# Patient Record
Sex: Female | Born: 1985 | Race: Black or African American | Hispanic: No | Marital: Single | Smoking: Former smoker
Health system: Southern US, Community
[De-identification: ages and names within clinical notes are randomized; demographics above are authoritative.]

## PROBLEM LIST (undated history)

## (undated) DIAGNOSIS — N182 Chronic kidney disease, stage 2 (mild): Secondary | ICD-10-CM

## (undated) DIAGNOSIS — E119 Type 2 diabetes mellitus without complications: Secondary | ICD-10-CM

## (undated) DIAGNOSIS — I639 Cerebral infarction, unspecified: Secondary | ICD-10-CM

## (undated) DIAGNOSIS — I10 Essential (primary) hypertension: Secondary | ICD-10-CM

## (undated) DIAGNOSIS — E669 Obesity, unspecified: Secondary | ICD-10-CM

## (undated) DIAGNOSIS — I509 Heart failure, unspecified: Secondary | ICD-10-CM

## (undated) DIAGNOSIS — K219 Gastro-esophageal reflux disease without esophagitis: Secondary | ICD-10-CM

## (undated) DIAGNOSIS — I5022 Chronic systolic (congestive) heart failure: Secondary | ICD-10-CM

## (undated) DIAGNOSIS — F1910 Other psychoactive substance abuse, uncomplicated: Secondary | ICD-10-CM

## (undated) DIAGNOSIS — Z91148 Patient's other noncompliance with medication regimen for other reason: Secondary | ICD-10-CM

## (undated) DIAGNOSIS — D509 Iron deficiency anemia, unspecified: Secondary | ICD-10-CM

## (undated) DIAGNOSIS — I428 Other cardiomyopathies: Secondary | ICD-10-CM

## (undated) DIAGNOSIS — Z9114 Patient's other noncompliance with medication regimen: Secondary | ICD-10-CM

## (undated) HISTORY — DX: Essential (primary) hypertension: I10

## (undated) HISTORY — PX: HERNIA REPAIR: SHX51

## (undated) HISTORY — PX: CHOLECYSTECTOMY: SHX55

## (undated) HISTORY — PX: ICD IMPLANT: EP1208

---

## 2007-01-02 ENCOUNTER — Emergency Department: Payer: Self-pay | Admitting: Emergency Medicine

## 2009-12-10 ENCOUNTER — Emergency Department: Payer: Self-pay | Admitting: Emergency Medicine

## 2010-07-24 ENCOUNTER — Emergency Department: Payer: Self-pay | Admitting: Unknown Physician Specialty

## 2010-12-18 ENCOUNTER — Emergency Department: Payer: Self-pay | Admitting: Emergency Medicine

## 2011-05-19 ENCOUNTER — Emergency Department: Payer: Self-pay | Admitting: Emergency Medicine

## 2011-08-23 ENCOUNTER — Emergency Department: Payer: Self-pay | Admitting: Emergency Medicine

## 2011-08-24 ENCOUNTER — Emergency Department: Payer: Self-pay | Admitting: Emergency Medicine

## 2013-12-15 ENCOUNTER — Emergency Department: Payer: Self-pay | Admitting: Emergency Medicine

## 2019-04-10 ENCOUNTER — Emergency Department
Admission: EM | Admit: 2019-04-10 | Discharge: 2019-04-10 | Disposition: A | Discharge status: Home / Self Care | Payer: Self-pay | Attending: Emergency Medicine | Admitting: Emergency Medicine

## 2019-04-10 ENCOUNTER — Emergency Department: Payer: Self-pay

## 2019-04-10 ENCOUNTER — Encounter: Payer: Self-pay | Admitting: Emergency Medicine

## 2019-04-10 ENCOUNTER — Other Ambulatory Visit: Payer: Self-pay

## 2019-04-10 DIAGNOSIS — F17210 Nicotine dependence, cigarettes, uncomplicated: Secondary | ICD-10-CM | POA: Insufficient documentation

## 2019-04-10 DIAGNOSIS — R0789 Other chest pain: Secondary | ICD-10-CM | POA: Insufficient documentation

## 2019-04-10 DIAGNOSIS — R739 Hyperglycemia, unspecified: Secondary | ICD-10-CM | POA: Insufficient documentation

## 2019-04-10 DIAGNOSIS — R778 Other specified abnormalities of plasma proteins: Secondary | ICD-10-CM | POA: Insufficient documentation

## 2019-04-10 DIAGNOSIS — E1369 Other specified diabetes mellitus with other specified complication: Secondary | ICD-10-CM

## 2019-04-10 HISTORY — DX: Gastro-esophageal reflux disease without esophagitis: K21.9

## 2019-04-10 LAB — HEPATIC FUNCTION PANEL
ALT: 14 U/L (ref 0–44)
AST: 14 U/L — ABNORMAL LOW (ref 15–41)
Albumin: 3 g/dL — ABNORMAL LOW (ref 3.5–5.0)
Alkaline Phosphatase: 94 U/L (ref 38–126)
Bilirubin, Direct: <0.1 mg/dL (ref 0.0–0.2)
Total Bilirubin: 0.9 mg/dL (ref 0.3–1.2)
Total Protein: 7.7 g/dL (ref 6.5–8.1)

## 2019-04-10 LAB — BASIC METABOLIC PANEL
Anion gap: 14 (ref 5–15)
BUN: 13 mg/dL (ref 6–20)
CO2: 21 mmol/L — ABNORMAL LOW (ref 22–32)
Calcium: 8.4 mg/dL — ABNORMAL LOW (ref 8.9–10.3)
Chloride: 96 mmol/L — ABNORMAL LOW (ref 98–111)
Creatinine, Ser: 1.06 mg/dL — ABNORMAL HIGH (ref 0.44–1.00)
GFR calc Af Amer: >60 mL/min (ref >60)
GFR calc non Af Amer: >60 mL/min (ref >60)
Glucose, Bld: 292 mg/dL — ABNORMAL HIGH (ref 70–99)
Potassium: 3.6 mmol/L (ref 3.5–5.1)
Sodium: 131 mmol/L — ABNORMAL LOW (ref 135–145)

## 2019-04-10 LAB — LIPASE, BLOOD: Lipase: 20 U/L (ref 11–51)

## 2019-04-10 LAB — CBC
HCT: 42.7 % (ref 36.0–46.0)
Hemoglobin: 13.6 g/dL (ref 12.0–15.0)
MCH: 27.5 pg (ref 26.0–34.0)
MCHC: 31.9 g/dL (ref 30.0–36.0)
MCV: 86.4 fL (ref 80.0–100.0)
Platelets: 449 10*3/uL — ABNORMAL HIGH (ref 150–400)
RBC: 4.94 MIL/uL (ref 3.87–5.11)
RDW: 14.1 % (ref 11.5–15.5)
WBC: 11.4 10*3/uL — ABNORMAL HIGH (ref 4.0–10.5)
nRBC: 0 % (ref 0.0–0.2)

## 2019-04-10 LAB — POCT PREGNANCY, URINE: Preg Test, Ur: NEGATIVE

## 2019-04-10 LAB — HEMOGLOBIN A1C
Hgb A1c MFr Bld: 12.7 % — ABNORMAL HIGH (ref 4.8–5.6)
Mean Plasma Glucose: 317.79 mg/dL

## 2019-04-10 LAB — GLUCOSE, CAPILLARY: Glucose-Capillary: 284 mg/dL — ABNORMAL HIGH (ref 70–99)

## 2019-04-10 LAB — FIBRIN DERIVATIVES D-DIMER (ARMC ONLY): Fibrin derivatives D-dimer (ARMC): 553.75 ng/mL (FEU) — ABNORMAL HIGH (ref 0.00–499.00)

## 2019-04-10 LAB — TROPONIN I (HIGH SENSITIVITY)
Troponin I (High Sensitivity): 20 ng/L — ABNORMAL HIGH (ref <18)
Troponin I (High Sensitivity): 20 ng/L — ABNORMAL HIGH (ref <18)

## 2019-04-10 MED ORDER — SODIUM CHLORIDE 0.9 % IV BOLUS
1000.0000 mL | Freq: Once | INTRAVENOUS | Status: AC
  Administered 2019-04-10: 16:00:00 1000 mL via INTRAVENOUS

## 2019-04-10 MED ORDER — ALUM & MAG HYDROXIDE-SIMETH 200-200-20 MG/5ML PO SUSP
30.0000 mL | Freq: Once | ORAL | Status: AC
  Administered 2019-04-10: 16:00:00 30 mL via ORAL
  Filled 2019-04-10: qty 30

## 2019-04-10 MED ORDER — FENTANYL CITRATE (PF) 100 MCG/2ML IJ SOLN
50.0000 ug | Freq: Once | INTRAMUSCULAR | Status: AC
  Administered 2019-04-10: 18:00:00 50 ug via INTRAVENOUS
  Filled 2019-04-10: qty 2

## 2019-04-10 MED ORDER — ONDANSETRON HCL 4 MG/2ML IJ SOLN
4.0000 mg | Freq: Once | INTRAMUSCULAR | Status: AC
  Administered 2019-04-10: 4 mg via INTRAVENOUS
  Filled 2019-04-10: qty 2

## 2019-04-10 MED ORDER — ONDANSETRON HCL 4 MG/2ML IJ SOLN: 4.0000 mg | Freq: Once | INTRAMUSCULAR | Status: DC

## 2019-04-10 MED ORDER — ACETAMINOPHEN 325 MG PO TABS
650.0000 mg | ORAL_TABLET | Freq: Once | ORAL | Status: AC
  Administered 2019-04-10: 16:00:00 650 mg via ORAL
  Filled 2019-04-10: qty 2

## 2019-04-10 MED ORDER — METFORMIN HCL 500 MG PO TABS: 500.0000 mg | ORAL_TABLET | Freq: Two times a day (BID) | ORAL | 0 refills | Status: DC

## 2019-04-10 MED ORDER — SODIUM CHLORIDE 0.9 % IV BOLUS
1000.0000 mL | Freq: Once | INTRAVENOUS | Status: AC
  Administered 2019-04-10: 18:00:00 1000 mL via INTRAVENOUS

## 2019-04-10 MED ORDER — SODIUM CHLORIDE 0.9% FLUSH
3.0000 mL | Freq: Once | INTRAVENOUS | Status: AC
  Administered 2019-04-10: 3 mL via INTRAVENOUS

## 2019-04-10 MED ORDER — IOHEXOL 350 MG/ML SOLN
75.0000 mL | Freq: Once | INTRAVENOUS | Status: AC | PRN
  Administered 2019-04-10: 75 mL via INTRAVENOUS
  Filled 2019-04-10: qty 75

## 2019-04-10 MED ORDER — PANTOPRAZOLE SODIUM 40 MG IV SOLR
40.0000 mg | Freq: Once | INTRAVENOUS | Status: AC
  Administered 2019-04-10: 16:00:00 40 mg via INTRAVENOUS
  Filled 2019-04-10: qty 40

## 2019-04-10 NOTE — ED Notes (Signed)
Patient transported to CT 

## 2019-04-10 NOTE — ED Notes (Signed)
Lights dimmed for patient comfort. Pt resting in bed at this time. Pt states she feels better, states she feels sleepy.

## 2019-04-10 NOTE — ED Notes (Signed)
Patient in bathroom at this time.

## 2019-04-10 NOTE — ED Provider Notes (Signed)
[facility] Emergency Department Provider Note  ____________________________________________   First MD Initiated Contact with Patient 04/10/19 1511     (approximate)  I have reviewed the triage vital signs and the nursing notes.   HISTORY  Chief Complaint Heartburn    HPI Molly Howard is a 33 y.o. female with acid reflux who comes in with pain in the center of her chest that radiates up into her chest.  She states that it feels like her prior acid reflux.  She not been taking her medicine for it.  Her pain is constant over the past 2 days, nothing makes it better, nothing makes it worse.  She has had a little bit of shortness of breath which is new for her.  She states it does hurt when she takes a deep breath.  She states that prior to today she had not been eating well but today she is eating better.  She denies any fevers.  Denies any leg swelling.  She has had reduced motion of her left shoulder secondary to a fall 1 year ago.  She states that she cannot raise it over over her shoulder.          Past Medical History:  Diagnosis Date   Acid reflux     There are no active problems to display for this patient.   Past Surgical History:  Procedure Laterality Date   CHOLECYSTECTOMY     HERNIA REPAIR      Prior to Admission medications   Not on File    Allergies Patient has no known allergies.  No family history on file.  Social History Social History   Tobacco Use   Smoking status: Current Every Day Smoker    Types: Cigarettes   Smokeless tobacco: Never Used  Substance Use Topics   Alcohol use: Yes   Drug use: Yes    Types: Cocaine      Review of Systems Constitutional: No fever/chills Eyes: No visual changes. ENT: No sore throat. Cardiovascular: Positive chest pain Respiratory: Positive shortness of breath Gastrointestinal: No abdominal pain.  No nausea, no vomiting.  No diarrhea.  No constipation. Genitourinary:  Negative for dysuria. Musculoskeletal: Negative for back pain.  Positive left shoulder pain Skin: Negative for rash. Neurological: Negative for headaches, focal weakness or numbness. All other ROS negative ____________________________________________   PHYSICAL EXAM:  VITAL SIGNS: ED Triage Vitals  Enc Vitals Group     BP 04/10/19 1433 (!) 143/107     Pulse Rate 04/10/19 1433 (!) 124     Resp 04/10/19 1433 20     Temp 04/10/19 1433 98.7 F (37.1 C)     Temp Source 04/10/19 1433 Oral     SpO2 04/10/19 1433 100 %     Weight 04/10/19 1435 220 lb (99.8 kg)     Height 04/10/19 1435 5\' 7"  (1.702 m)     Head Circumference --      Peak Flow --      Pain Score 04/10/19 1450 7     Pain Loc --      Pain Edu? --      Excl. in [place]? --     Constitutional: Alert and oriented. Well appearing and in no acute distress. Eyes: Conjunctivae are normal. EOMI. Head: Atraumatic. Nose: No congestion/rhinnorhea. Mouth/Throat: Mucous membranes are moist.   Neck: No stridor. Trachea Midline. FROM Cardiovascular: Normal rate, regular rhythm. Grossly normal heart sounds.  Good peripheral circulation. Respiratory: Normal respiratory effort.  No retractions. Lungs CTAB. Gastrointestinal: Soft and nontender. No distention. No abdominal bruits.  Musculoskeletal: Tenderness on the left shoulder with limited range of motion Neurologic:  Normal speech and language. No gross focal neurologic deficits are appreciated.  Skin:  Skin is warm, dry and intact. No rash noted. Psychiatric: Mood and affect are normal. Speech and behavior are normal. GU: Deferred   ____________________________________________   LABS (all labs ordered are listed, but only abnormal results are displayed)  Labs Reviewed  BASIC METABOLIC PANEL - Abnormal; Notable for the following components:      Result Value   Sodium 131 (*)    Chloride 96 (*)    CO2 21 (*)    Glucose, Bld 292 (*)    Creatinine, Ser 1.06 (*)    Calcium 8.4  (*)    All other components within normal limits  CBC - Abnormal; Notable for the following components:   WBC 11.4 (*)    Platelets 449 (*)    All other components within normal limits  FIBRIN DERIVATIVES D-DIMER ([facility] ONLY) - Abnormal; Notable for the following components:   Fibrin derivatives D-dimer ([facility]) 553.75 (*)    All other components within normal limits  HEPATIC FUNCTION PANEL - Abnormal; Notable for the following components:   Albumin 3.0 (*)    AST 14 (*)    All other components within normal limits  GLUCOSE, CAPILLARY - Abnormal; Notable for the following components:   Glucose-Capillary 284 (*)    All other components within normal limits  TROPONIN I (HIGH SENSITIVITY) - Abnormal; Notable for the following components:   Troponin I (High Sensitivity) 20 (*)    All other components within normal limits  TROPONIN I (HIGH SENSITIVITY) - Abnormal; Notable for the following components:   Troponin I (High Sensitivity) 20 (*)    All other components within normal limits  LIPASE, BLOOD  HEMOGLOBIN A1C  POC URINE PREG, ED  POCT PREGNANCY, URINE   ____________________________________________   ED ECG REPORT I, Concha Se, the attending physician, personally viewed and interpreted this ECG.  EKG is sinus tachycardia rate of 126, no ST elevations, no T wave inversions, normal intervals ____________________________________________  RADIOLOGY Vela Prose, personally viewed and evaluated these images (plain radiographs) as part of my medical decision making, as well as reviewing the written report by the radiologist.  ED MD interpretation: No fractures noted  Official radiology report(s): Dg Chest 2 View  Result Date: 04/10/2019 CLINICAL DATA:  Pt states she has been up "earling" all night, states she has acid reflux and is out of her meds. Pt also wanting to be seen for her left shoulder, states she has been having pain since a fall a year ago. No previous hx. Current  everyday smokerchest pain EXAM: CHEST - 2 VIEW COMPARISON:  None. FINDINGS: The heart size and mediastinal contours are within normal limits. Both lungs are clear. The visualized skeletal structures are unremarkable. IMPRESSION: No active cardiopulmonary disease. Electronically Signed   By: Norva Pavlov M.D.   On: 04/10/2019 15:25   Ct Angio Chest Pe W And/or Wo Contrast  Result Date: 04/10/2019 CLINICAL DATA:  Complex chest pain, mid chest and shoulder pain EXAM: CT ANGIOGRAPHY CHEST WITH CONTRAST TECHNIQUE: Multidetector CT imaging of the chest was performed using the standard protocol during bolus administration of intravenous contrast. Multiplanar CT image reconstructions and MIPs were obtained to evaluate the vascular anatomy. CONTRAST:  66mL OMNIPAQUE IOHEXOL 350 MG/ML SOLN COMPARISON:  Chest radiograph  04/10/2011 FINDINGS: Cardiovascular: Satisfactory opacification the pulmonary arteries to the lobar level, more distal evaluation limited by respiratory motion artifact. No pulmonary artery filling defects are identified. Central pulmonary arteries are normal caliber. No elevation of the RV/LV ratio (0.7). Cardiac size within normal limits. No pericardial effusion. Normal caliber thoracic aorta. Shared origin of the left common carotid and brachiocephalic arteries. 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 Thyroid gland and thoracic inlet are unremarkable. No acute abnormality of the trachea or esophagus. No mediastinal, hilar or axillary adenopathy. Lungs/Pleura: No consolidation, features of edema, pneumothorax, or effusion. Solid 5 mm nodule in the posterior right lobe (7/48). Upper Abdomen: Patient is post cholecystectomy. No acute abnormalities present in the visualized portions of the upper abdomen. Musculoskeletal: No chest wall abnormality. No acute or  significant osseous findings. Review of the MIP images confirms the above findings. IMPRESSION: 1. No acute cardiopulmonary process. Specifically, no evidence of pulmonary embolus to the lobar level. More distal evaluation limited due to respiratory motion. 2. 5 mm right lower lobe pulmonary nodule. Likely post infectious or inflammatory in this patient age < 6235. Typical incidental pulmonary nodule follow-up guidelines do not apply to patients under the age of 34. Decision for further imaging follow-up should be based on clinical decision making and discussion with the patient regarding risk factors. Could consider 12 month follow-up CT of felt to be clinically warranted. 3. 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 Electronically Signed   By: Kreg ShropshirePrice  DeHay M.D.   On: 04/10/2019 18:35   Dg Shoulder Left  Result Date: 04/10/2019 CLINICAL DATA:  Left shoulder pain since fall 1 year prior EXAM: LEFT SHOULDER - 2+ VIEW COMPARISON:  None. FINDINGS: Mild acromioclavicular arthrosis. Minimal glenohumeral degenerative change. Humeral head is normally seated. No acute fracture or traumatic malalignment. Included portions of the left chest and left lung are clear. IMPRESSION: Mild acromioclavicular arthrosis. No acute osseous abnormality. Electronically Signed   By: Kreg ShropshirePrice  DeHay M.D.   On: 04/10/2019 15:48    ____________________________________________   PROCEDURES  Procedure(s) performed (including Critical Care):  Procedures   ____________________________________________   INITIAL IMPRESSION / ASSESSMENT AND PLAN / ED COURSE  Molly Howard was evaluated in Emergency Department on 04/10/2019 for the symptoms described in the history of present illness. She was evaluated in the context of the global COVID-19 pandemic, which necessitated consideration that the patient might be at  risk for infection with the SARS-CoV-2 virus that causes COVID-19. Institutional protocols and algorithms that pertain to the evaluation of patients at risk for COVID-19 are in a state of rapid change based on information released by regulatory bodies including the CDC and federal and state organizations. These policies and algorithms were followed during the patient's care in the ED.    Patient is a 33 year old who presents with epigastric pain that seems more consistent with her GERD.  Will get cardiac markers to make sure there is no evidence of ACS.  Will also get D-dimer given she reports new pleuritic shortness of breath and patient is tachycardic.  Will get basic labs to evaluate for pancreatitis although patient does not seem to be tender to much in her abdomen except for some mild epigastric tenderness.  Patient is also already had her gallbladder removed.  No lower abdominal  pain to suggest SBO, appendicitis.  I suspect her shoulder pain that could be a rotator cuff issue given her limited range of motion.  Will get an x-ray and have her follow-up with Ortho  Patient's labs are reassuring although she does have slightly elevated white count and platelets but denies any infectious symptoms.  Her troponin was elevated at 20 which I suspect is more likely demand from her elevated heart rate however we will get a CT scan to rule out pulmonary embolism given her D-dimer is also elevated.  We also get repeat troponin to make sure it is not climbing.  5:45 PM Reevaluated patient she continues to have centralized chest discomfort..  She has no pain in her abdomen at all.  We will give a dose of fentanyl while awaiting her CT scan  Reevaluated patient.  Patient is doing better.  CT scan was negative for PE.  Her neck marker was stable and I have low suspicion for this being ACS.  Patient sugar was elevated to 292 and she does endorse some increased thirst.  Will add on hemoglobin A1c.  I suspect that  this may be a new diabetes.  We will start her on Metformin and have her follow-up with Phineas Real.  7:41 PM reevaluated patient she continues to have a little bit of chest tightness.  Again her troponins were stable at 20. I I discussed admission for cardiac work-up patient says she prefer to go home.  Will discuss with cardiology try to get her close follow-up.  Will also start her on Metformin for her diabetes.  Heart rate has improved with fluids.  Patient continues to look well.  Discussed with Dr. Shirlee Latch who recommended admission for echocardiogram.  Patient continues to decline admission.  She understands the risk of death or permanent disability.  Dr. Jearld Pies will f/u with her in clinic and she understands that if her symptoms are worsening she should return to the ER.  I discussed the provisional nature of ED diagnosis, the treatment so far, the ongoing plan of care, follow up appointments and return precautions with the patient and any family or support people present. They expressed understanding and agreed with the plan, discharged home.    ____________________________________________   FINAL CLINICAL IMPRESSION(S) / ED DIAGNOSES   Final diagnoses:  Other chest pain  Elevated troponin  Other specified diabetes mellitus with other specified complication, without long-term current use of insulin (HCC)      MEDICATIONS GIVEN DURING THIS VISIT:  Medications  sodium chloride flush (NS) 0.9 % injection 3 mL (3 mLs Intravenous Given 04/10/19 1604)  sodium chloride 0.9 % bolus 1,000 mL (0 mLs Intravenous Stopped 04/10/19 1824)  ondansetron (ZOFRAN) injection 4 mg (4 mg Intravenous Given 04/10/19 1603)  acetaminophen (TYLENOL) tablet 650 mg (650 mg Oral Given 04/10/19 1559)  pantoprazole (PROTONIX) injection 40 mg (40 mg Intravenous Given 04/10/19 1624)  alum & mag hydroxide-simeth (MAALOX/MYLANTA) 200-200-20 MG/5ML suspension 30 mL (30 mLs Oral Given 04/10/19 1601)  fentaNYL  (SUBLIMAZE) injection 50 mcg (50 mcg Intravenous Given 04/10/19 1756)  iohexol (OMNIPAQUE) 350 MG/ML injection 75 mL (75 mLs Intravenous Contrast Given 04/10/19 1759)  sodium chloride 0.9 % bolus 1,000 mL (1,000 mLs Intravenous New Bag/Given 04/10/19 1825)     ED Discharge Orders    None       Note:  This document was prepared using Dragon voice recognition software and may include unintentional dictation errors.   Concha Se, MD 04/10/19 2041

## 2019-04-10 NOTE — ED Triage Notes (Signed)
Pt states she has been up "earling" all night, states she has acid reflux and is out of her meds. Pt also wanting to be seen for her left shoulder, states she has been having pain since a fall a year ago.

## 2019-04-10 NOTE — Discharge Instructions (Addendum)
We are concerned you have new onset diabetes.  You should take the Metformin to help.  You to follow-up with Juanda Crumble drew this week to have a sugar recheck and to get establish care.  Cardiac markers were also elevated you had some chest tightness.  We recommended admission for echo but you declined.  He should follow-up outpatient with Dr. Algernon Huxley.   Return to the ER if you develop worsening pain, shortness of breath or any other concerns

## 2019-04-11 ENCOUNTER — Telehealth: Payer: Self-pay

## 2019-04-11 NOTE — Telephone Encounter (Signed)
Call attempted to make sure patient is aware of scheduled appt post ED visit.   No answer, no VM.

## 2019-04-17 NOTE — Telephone Encounter (Signed)
Made call to patient to confirm upcoming appt post ED visit.   Pt verbalized understanding and will be at appt.   Advised pt to call for any further questions or concerns.

## 2019-04-19 ENCOUNTER — Ambulatory Visit: Payer: Self-pay | Admitting: Cardiology

## 2019-10-02 ENCOUNTER — Emergency Department: Payer: Self-pay

## 2019-10-02 ENCOUNTER — Inpatient Hospital Stay
Admission: EM | Admit: 2019-10-02 | Discharge: 2019-10-05 | DRG: 291 | Disposition: A | Discharge status: Home / Self Care | Payer: Self-pay | Attending: Internal Medicine | Admitting: Internal Medicine

## 2019-10-02 ENCOUNTER — Other Ambulatory Visit: Payer: Self-pay

## 2019-10-02 ENCOUNTER — Encounter: Payer: Self-pay | Admitting: Emergency Medicine

## 2019-10-02 DIAGNOSIS — Z79899 Other long term (current) drug therapy: Secondary | ICD-10-CM

## 2019-10-02 DIAGNOSIS — I248 Other forms of acute ischemic heart disease: Secondary | ICD-10-CM | POA: Diagnosis present

## 2019-10-02 DIAGNOSIS — R911 Solitary pulmonary nodule: Secondary | ICD-10-CM | POA: Diagnosis present

## 2019-10-02 DIAGNOSIS — F1410 Cocaine abuse, uncomplicated: Secondary | ICD-10-CM | POA: Diagnosis present

## 2019-10-02 DIAGNOSIS — Z833 Family history of diabetes mellitus: Secondary | ICD-10-CM

## 2019-10-02 DIAGNOSIS — Z6836 Body mass index (BMI) 36.0-36.9, adult: Secondary | ICD-10-CM

## 2019-10-02 DIAGNOSIS — I5020 Unspecified systolic (congestive) heart failure: Secondary | ICD-10-CM

## 2019-10-02 DIAGNOSIS — R609 Edema, unspecified: Secondary | ICD-10-CM

## 2019-10-02 DIAGNOSIS — E669 Obesity, unspecified: Secondary | ICD-10-CM | POA: Diagnosis present

## 2019-10-02 DIAGNOSIS — I428 Other cardiomyopathies: Secondary | ICD-10-CM | POA: Diagnosis present

## 2019-10-02 DIAGNOSIS — E8809 Other disorders of plasma-protein metabolism, not elsewhere classified: Secondary | ICD-10-CM | POA: Diagnosis present

## 2019-10-02 DIAGNOSIS — R0600 Dyspnea, unspecified: Secondary | ICD-10-CM

## 2019-10-02 DIAGNOSIS — I5021 Acute systolic (congestive) heart failure: Secondary | ICD-10-CM | POA: Diagnosis present

## 2019-10-02 DIAGNOSIS — E1165 Type 2 diabetes mellitus with hyperglycemia: Secondary | ICD-10-CM | POA: Diagnosis present

## 2019-10-02 DIAGNOSIS — D649 Anemia, unspecified: Secondary | ICD-10-CM | POA: Diagnosis present

## 2019-10-02 DIAGNOSIS — Z20822 Contact with and (suspected) exposure to covid-19: Secondary | ICD-10-CM | POA: Diagnosis present

## 2019-10-02 DIAGNOSIS — K219 Gastro-esophageal reflux disease without esophagitis: Secondary | ICD-10-CM | POA: Diagnosis present

## 2019-10-02 DIAGNOSIS — Z7984 Long term (current) use of oral hypoglycemic drugs: Secondary | ICD-10-CM

## 2019-10-02 DIAGNOSIS — E876 Hypokalemia: Secondary | ICD-10-CM | POA: Diagnosis present

## 2019-10-02 DIAGNOSIS — I509 Heart failure, unspecified: Secondary | ICD-10-CM

## 2019-10-02 DIAGNOSIS — F17210 Nicotine dependence, cigarettes, uncomplicated: Secondary | ICD-10-CM | POA: Diagnosis present

## 2019-10-02 DIAGNOSIS — I110 Hypertensive heart disease with heart failure: Principal | ICD-10-CM | POA: Diagnosis present

## 2019-10-02 HISTORY — DX: Type 2 diabetes mellitus without complications: E11.9

## 2019-10-02 HISTORY — DX: Obesity, unspecified: E66.9

## 2019-10-02 LAB — COMPREHENSIVE METABOLIC PANEL
ALT: 12 U/L (ref 0–44)
AST: 14 U/L — ABNORMAL LOW (ref 15–41)
Albumin: 2.2 g/dL — ABNORMAL LOW (ref 3.5–5.0)
Alkaline Phosphatase: 84 U/L (ref 38–126)
Anion gap: 8 (ref 5–15)
BUN: 9 mg/dL (ref 6–20)
CO2: 27 mmol/L (ref 22–32)
Calcium: 7.9 mg/dL — ABNORMAL LOW (ref 8.9–10.3)
Chloride: 100 mmol/L (ref 98–111)
Creatinine, Ser: 0.82 mg/dL (ref 0.44–1.00)
GFR calc Af Amer: >60 mL/min (ref >60)
GFR calc non Af Amer: >60 mL/min (ref >60)
Glucose, Bld: 259 mg/dL — ABNORMAL HIGH (ref 70–99)
Potassium: 3.6 mmol/L (ref 3.5–5.1)
Sodium: 135 mmol/L (ref 135–145)
Total Bilirubin: 0.7 mg/dL (ref 0.3–1.2)
Total Protein: 6.3 g/dL — ABNORMAL LOW (ref 6.5–8.1)

## 2019-10-02 LAB — CBC
HCT: 28.2 % — ABNORMAL LOW (ref 36.0–46.0)
Hemoglobin: 8.8 g/dL — ABNORMAL LOW (ref 12.0–15.0)
MCH: 25.9 pg — ABNORMAL LOW (ref 26.0–34.0)
MCHC: 31.2 g/dL (ref 30.0–36.0)
MCV: 82.9 fL (ref 80.0–100.0)
Platelets: 527 10*3/uL — ABNORMAL HIGH (ref 150–400)
RBC: 3.4 MIL/uL — ABNORMAL LOW (ref 3.87–5.11)
RDW: 15.1 % (ref 11.5–15.5)
WBC: 8 10*3/uL (ref 4.0–10.5)
nRBC: 0 % (ref 0.0–0.2)

## 2019-10-02 LAB — FERRITIN: Ferritin: 3 ng/mL — ABNORMAL LOW (ref 11–307)

## 2019-10-02 LAB — TROPONIN I (HIGH SENSITIVITY)
Troponin I (High Sensitivity): 19 ng/L — ABNORMAL HIGH (ref <18)
Troponin I (High Sensitivity): 21 ng/L — ABNORMAL HIGH (ref <18)

## 2019-10-02 LAB — GLUCOSE, CAPILLARY: Glucose-Capillary: 372 mg/dL — ABNORMAL HIGH (ref 70–99)

## 2019-10-02 LAB — BRAIN NATRIURETIC PEPTIDE: B Natriuretic Peptide: 391 pg/mL — ABNORMAL HIGH (ref 0.0–100.0)

## 2019-10-02 LAB — TSH: TSH: 3.286 u[IU]/mL (ref 0.350–4.500)

## 2019-10-02 LAB — POCT PREGNANCY, URINE: Preg Test, Ur: NEGATIVE

## 2019-10-02 LAB — PHOSPHORUS: Phosphorus: 3.6 mg/dL (ref 2.5–4.6)

## 2019-10-02 LAB — SARS CORONAVIRUS 2 BY RT PCR (HOSPITAL ORDER, PERFORMED IN ~~LOC~~ HOSPITAL LAB): SARS Coronavirus 2: NEGATIVE

## 2019-10-02 LAB — MAGNESIUM: Magnesium: 1.8 mg/dL (ref 1.7–2.4)

## 2019-10-02 LAB — FOLATE: Folate: 8.6 ng/mL (ref >5.9)

## 2019-10-02 MED ORDER — INSULIN ASPART 100 UNIT/ML ~~LOC~~ SOLN
0.0000 [IU] | Freq: Three times a day (TID) | SUBCUTANEOUS | Status: DC
  Administered 2019-10-03: 2 [IU] via SUBCUTANEOUS
  Administered 2019-10-03 (×2): 5 [IU] via SUBCUTANEOUS
  Administered 2019-10-04: 3 [IU] via SUBCUTANEOUS
  Filled 2019-10-02 (×4): qty 1

## 2019-10-02 MED ORDER — BISACODYL 5 MG PO TBEC: 5.0000 mg | DELAYED_RELEASE_TABLET | Freq: Every day | ORAL | Status: DC | PRN

## 2019-10-02 MED ORDER — HYDROCODONE-ACETAMINOPHEN 5-325 MG PO TABS: 1.0000 | ORAL_TABLET | ORAL | Status: DC | PRN

## 2019-10-02 MED ORDER — ACETAMINOPHEN 650 MG RE SUPP: 650.0000 mg | Freq: Four times a day (QID) | RECTAL | Status: DC | PRN

## 2019-10-02 MED ORDER — ONDANSETRON HCL 4 MG PO TABS: 4.0000 mg | ORAL_TABLET | Freq: Four times a day (QID) | ORAL | Status: DC | PRN

## 2019-10-02 MED ORDER — FUROSEMIDE 10 MG/ML IJ SOLN
20.0000 mg | Freq: Every day | INTRAMUSCULAR | Status: DC
  Administered 2019-10-03: 20 mg via INTRAVENOUS
  Filled 2019-10-02: qty 2

## 2019-10-02 MED ORDER — ACETAMINOPHEN 325 MG PO TABS: 650.0000 mg | ORAL_TABLET | Freq: Four times a day (QID) | ORAL | Status: DC | PRN

## 2019-10-02 MED ORDER — INSULIN ASPART 100 UNIT/ML ~~LOC~~ SOLN
0.0000 [IU] | Freq: Every day | SUBCUTANEOUS | Status: DC
  Administered 2019-10-03: 5 [IU] via SUBCUTANEOUS
  Filled 2019-10-02: qty 1

## 2019-10-02 MED ORDER — FUROSEMIDE 10 MG/ML IJ SOLN
60.0000 mg | Freq: Once | INTRAMUSCULAR | Status: AC
  Administered 2019-10-02: 60 mg via INTRAVENOUS
  Filled 2019-10-02: qty 8

## 2019-10-02 MED ORDER — LISINOPRIL 5 MG PO TABS
2.5000 mg | ORAL_TABLET | Freq: Every day | ORAL | Status: DC
  Administered 2019-10-03: 2.5 mg via ORAL
  Filled 2019-10-02: qty 1

## 2019-10-02 MED ORDER — ALBUTEROL SULFATE (2.5 MG/3ML) 0.083% IN NEBU
2.5000 mg | INHALATION_SOLUTION | Freq: Four times a day (QID) | RESPIRATORY_TRACT | Status: DC | PRN
  Administered 2019-10-02: 2.5 mg via RESPIRATORY_TRACT
  Filled 2019-10-02 (×2): qty 3

## 2019-10-02 MED ORDER — METOPROLOL SUCCINATE ER 25 MG PO TB24
12.5000 mg | ORAL_TABLET | Freq: Every day | ORAL | Status: DC
  Administered 2019-10-03: 12.5 mg via ORAL
  Filled 2019-10-02: qty 1

## 2019-10-02 MED ORDER — MELATONIN 5 MG PO TABS
5.0000 mg | ORAL_TABLET | Freq: Every day | ORAL | Status: DC
  Administered 2019-10-03 – 2019-10-04 (×2): 5 mg via ORAL
  Filled 2019-10-02 (×3): qty 1

## 2019-10-02 MED ORDER — SODIUM CHLORIDE 0.9 % IV SOLN: Freq: Once | INTRAVENOUS | Status: AC

## 2019-10-02 MED ORDER — SENNOSIDES-DOCUSATE SODIUM 8.6-50 MG PO TABS: 1.0000 | ORAL_TABLET | Freq: Every evening | ORAL | Status: DC | PRN

## 2019-10-02 MED ORDER — ONDANSETRON HCL 4 MG/2ML IJ SOLN: 4.0000 mg | Freq: Four times a day (QID) | INTRAMUSCULAR | Status: DC | PRN

## 2019-10-02 MED ORDER — IPRATROPIUM BROMIDE 0.02 % IN SOLN: 0.5000 mg | Freq: Four times a day (QID) | RESPIRATORY_TRACT | Status: DC | PRN

## 2019-10-02 MED ORDER — MORPHINE SULFATE (PF) 2 MG/ML IV SOLN: 1.0000 mg | INTRAVENOUS | Status: DC | PRN

## 2019-10-02 MED ORDER — FUROSEMIDE 10 MG/ML IJ SOLN: 20.0000 mg | Freq: Every day | INTRAMUSCULAR | Status: DC

## 2019-10-02 NOTE — ED Notes (Signed)
Lab contacted for repeat pink top tube. Will collect on floor

## 2019-10-02 NOTE — ED Notes (Signed)
Attempted to call report, was informed that assignments were being made and had not been given out. This nurses phone number was provided to call for report.

## 2019-10-02 NOTE — ED Provider Notes (Signed)
[facility] Emergency Department Provider Note  Time seen: 8:34 PM  I have reviewed the triage vital signs and the nursing notes.   HISTORY  Chief Complaint Leg Swelling    HPI Molly Howard is a 34 y.o. female with no past medical history presents to the emergency department for 1 week of lower extremity swelling.  According to the patient over the past 1 week she has had significant swelling of her lower extremities.  Over the past 24 hours has become short of breath with minimal exertion.  Denies any chest pain at any point.  Denies any recent illnesses or fever.  Denies any recent vaccinations.  Patient has never had children.   Past Medical History:  Diagnosis Date  . Acid reflux     There are no problems to display for this patient.   Past Surgical History:  Procedure Laterality Date  . CHOLECYSTECTOMY    . HERNIA REPAIR      Prior to Admission medications   Medication Sig Start Date End Date Taking? Authorizing Provider  metFORMIN (GLUCOPHAGE) 500 MG tablet Take 500 mg by mouth 2 (two) times daily with a meal.   Yes [provider]    Allergies  Allergen Reactions  . No [employer] Food Allergies Rash and Other (See Comments)    Lemons    No family history on file.  Social History Social History   Tobacco Use  . Smoking status: Current Every Day Smoker    Types: Cigarettes  . Smokeless tobacco: Never Used  Substance Use Topics  . Alcohol use: Yes  . Drug use: Yes    Types: Cocaine    Review of Systems Constitutional: Negative for fever. Cardiovascular: Negative for chest pain. Respiratory: Positive for shortness of breath, worse with exertion.  No cough. Gastrointestinal: Negative for abdominal pain, vomiting and diarrhea. Genitourinary: Negative for urinary compaints Musculoskeletal: Lower extremity swelling bilaterally. Skin: Negative for skin complaints  Neurological: Negative for headache All other ROS  negative  ____________________________________________   PHYSICAL EXAM:  VITAL SIGNS: ED Triage Vitals  Enc Vitals Group     BP 10/02/19 1344 (!) 146/96     Pulse Rate 10/02/19 1344 (!) 105     Resp 10/02/19 1344 18     Temp 10/02/19 1344 98.4 F (36.9 C)     Temp Source 10/02/19 1344 Oral     SpO2 10/02/19 1344 98 %     Weight 10/02/19 1345 215 lb (97.5 kg)     Height 10/02/19 1345 5\' 7"  (1.702 m)     Head Circumference --      Peak Flow --      Pain Score 10/02/19 1344 6     Pain Loc --      Pain Edu? --      Excl. in [place]? --    Constitutional: Alert and oriented. Well appearing and in no distress. Eyes: Normal exam ENT      Head: Normocephalic and atraumatic.      Mouth/Throat: Mucous membranes are moist. Cardiovascular: Normal rate, regular rhythm.  Respiratory: Normal respiratory effort without tachypnea nor retractions. Breath sounds are clear  Gastrointestinal: Soft and nontender. No distention Musculoskeletal: Nontender with normal range of motion in all extremities.  2+ lower extremity edema, pitting, equal bilaterally. Neurologic:  Normal speech and language. No gross focal neurologic deficits are appreciated. Skin:  Skin is warm, dry and intact.  Psychiatric: Mood and affect are normal.   ____________________________________________  EKG  EKG viewed and interpreted by myself shows sinus tachycardia 105 bpm with a narrow QRS, normal axis, normal intervals besides slight QTC prolongation, nonspecific ST changes without ST elevation.  ____________________________________________    RADIOLOGY  Chest x-ray shows patchy bilateral opacities could reflect edema versus infection.  ____________________________________________   INITIAL IMPRESSION / ASSESSMENT AND PLAN / ED COURSE  Pertinent labs & imaging results that were available during my care of the patient were reviewed by me and considered in my medical decision making (see chart for details).    Patient presents emergency department for 2+ lower extremity pitting edema over the past [redacted] week along with shortness of breath worse with exertion.  On exam patient has pitting edema in the lower extremities, no history of the same.  No recent chest pain, illnesses or vaccinations.  Patient states over the past 24 hours she has become short of breath much worse with any exertion.  X-ray shows bilateral opacities, highly suspect edema given no cough or fever in the setting lower extremity edema.  We will treat with IV Lasix.  Patient's labs also shows slight elevation in troponin as well as BNP.  Patient has become anemic.  Rectal examination shows light brown stool guaiac negative.  Patient states for the last 2 months her periods have been heavier than normal but states they are only lasting approximately 7 days each and only heavy for the first 2 to 3 days.  At this time is not clear the cause of the patient's new onset anemia although I feel that is likely contributing to her new onset CHF.  We will admit the patient to the hospital service for ongoing treatment, diuresis and cardiology consultation for echocardiogram.  Patient agreeable to plan of care.  Molly Howard was evaluated in Emergency Department on 10/02/2019 for the symptoms described in the history of present illness. She was evaluated in the context of the global COVID-19 pandemic, which necessitated consideration that the patient might be at risk for infection with the SARS-CoV-2 virus that causes COVID-19. Institutional protocols and algorithms that pertain to the evaluation of patients at risk for COVID-19 are in a state of rapid change based on information released by regulatory bodies including the CDC and federal and state organizations. These policies and algorithms were followed during the patient's care in the ED.  ____________________________________________   FINAL CLINICAL IMPRESSION(S) / ED DIAGNOSES  Congestive heart  failure Pulmonary edema Peripheral edema   Harvest Dark, MD 10/02/19 2144

## 2019-10-02 NOTE — ED Notes (Signed)
Pt now in lobby.

## 2019-10-02 NOTE — ED Notes (Signed)
Called no answer for room. 

## 2019-10-02 NOTE — ED Notes (Signed)
Pt c/o BL LE swelling with SOB over the past week. Denies any cardiac hx. Pt does have pale color and is out of breath talking while sitting.

## 2019-10-02 NOTE — ED Triage Notes (Signed)
Patient reports bilateral leg swelling for the last week with intermittent SOB. No history of similar episodes.

## 2019-10-02 NOTE — H&P (Signed)
History and Physical   [facility] - [place] @ [facility] Admission History and Physical [employer], D.O.    Patient Name: Molly Howard MR#: [id number] Date of Birth: 1985/09/06 Date of Admission: 10/02/2019  Referring MD/NP/PA: Dr. Ovidio Kin Primary Care Physician: Patient, No Pcp Per  Chief Complaint:  Chief Complaint  Patient presents with  . Leg Swelling    HPI: Molly Howard is a 34 y.o. female with a known history of DM, GERD presents to the emergency department for evaluation of leg swelling.  Patient was in a usual state of health until 1 week ago when she developed bilateral leg swelling which has been been progressively worsening as well as intermittent shortness of breath and PND.  Dyspnea on exertion has also become worsening over the past 24 hours. On further questioning she reports awakening at night on occasion with palpitations.  Denies any known cardiac history.  Denies drug or alcohol uise  Patient denies fevers/chills, weakness, dizziness, chest pain,  N/V/C/D, abdominal pain, dysuria/frequency, changes in mental status, heavy vaginal bleeding.   Otherwise there has been no change in status. Patient has been taking medication as prescribed and there has been no recent change in medication or diet.  No recent antibiotics.  There has been no recent illness, hospitalizations, travel or sick contacts.    EMS/ED Course: Patient received Lasix. Medical admission has been requested for further management of new onset of congestive heart failure, symptomatic anemia-normocytic without active bleeding, hyperglycemia without DKA.  Review of Systems:  CONSTITUTIONAL: Positive fatigue and weakness, no fever/chills,weight gain/loss, headache. EYES: No blurry or double vision. ENT: No tinnitus, postnasal drip, redness or soreness of the oropharynx. RESPIRATORY: Positive shortness of breath, dyspnea on exertion no cough, wheeze.  No hemoptysis.  CARDIOVASCULAR: Positive  lower extremity edema, palpitations. No chest pain, palpitations, syncope, orthopnea.   GASTROINTESTINAL: No nausea, vomiting, abdominal pain, diarrhea, constipation.  No hematemesis, melena or hematochezia. GENITOURINARY: No dysuria, frequency, hematuria. ENDOCRINE: No polyuria or nocturia. No heat or cold intolerance. HEMATOLOGY: No anemia, bruising, bleeding. INTEGUMENTARY: No rashes, ulcers, lesions. MUSCULOSKELETAL: No arthritis, gout, dyspnea. NEUROLOGIC: No numbness, tingling, ataxia, seizure-type activity, weakness. PSYCHIATRIC: No anxiety, depression, insomnia.   Past Medical History:  Diagnosis Date  . Acid reflux     Past Surgical History:  Procedure Laterality Date  . CHOLECYSTECTOMY    . HERNIA REPAIR       reports that she has been smoking cigarettes. She has never used smokeless tobacco. She reports current alcohol use. She reports current drug use. Drug: Cocaine.  Allergies  Allergen Reactions  . No [employer] Food Allergies Rash and Other (See Comments)    Lemons    No family history on file.  Prior to Admission medications   Medication Sig Start Date End Date Taking? Authorizing Provider  metFORMIN (GLUCOPHAGE) 500 MG tablet Take 500 mg by mouth 2 (two) times daily with a meal.   Yes [provider]    Physical Exam: Vitals:   10/02/19 1344 10/02/19 1345 10/02/19 1450 10/02/19 1746  BP: (!) 146/96  (!) 152/95 (!) 163/106  Pulse: (!) 105  96 96  Resp: 18  18 16   Temp: 98.4 F (36.9 C)  98.2 F (36.8 C)   TempSrc: Oral  Oral   SpO2: 98%  97% 98%  Weight:  97.5 kg    Height:  5\' 7"  (1.702 m)      GENERAL: 34 y.o.-year-old female patient, well-developed, well-nourished lying in the bed in no acute  distress.  Pleasant and cooperative.   HEENT: Pale, head atraumatic, normocephalic. Pupils equal. Mucus membranes moist. NECK: Supple. No JVD. CHEST: Normal breath sounds bilaterally. No wheezing, rales, rhonchi or crackles. No use of accessory  muscles of respiration.  No reproducible chest wall tenderness.  CARDIOVASCULAR: S1, S2 normal. No murmurs, rubs, or gallops. Cap refill <2 seconds. Pulses intact distally.  ABDOMEN: Soft, nondistended, nontender. No rebound, guarding, rigidity. Normoactive bowel sounds present in all four quadrants.  EXTREMITIES: Pitting edema bilateral lower extremities to mid-calf.  No cyanosis, or clubbing. No calf tenderness or Homan's sign.  NEUROLOGIC: The patient is alert and oriented x 3. Cranial nerves II through XII are grossly intact with no focal sensorimotor deficit. PSYCHIATRIC:  Normal affect, mood, thought content. SKIN: Warm, dry, and intact without obvious rash, lesion, or ulcer.    Labs on Admission:  CBC: Recent Labs  Lab 10/02/19 1348  WBC 8.0  HGB 8.8*  HCT 28.2*  MCV 82.9  PLT 527*   Basic Metabolic Panel: Recent Labs  Lab 10/02/19 1348  NA 135  K 3.6  CL 100  CO2 27  GLUCOSE 259*  BUN 9  CREATININE 0.82  CALCIUM 7.9*   GFR: Estimated Creatinine Clearance: 114.9 mL/min (by C-G formula based on SCr of 0.82 mg/dL). Liver Function Tests: Recent Labs  Lab 10/02/19 1348  AST 14*  ALT 12  ALKPHOS 84  BILITOT 0.7  PROT 6.3*  ALBUMIN 2.2*   No results for input(s): LIPASE, AMYLASE in the last 168 hours. No results for input(s): AMMONIA in the last 168 hours. Coagulation Profile: No results for input(s): INR, PROTIME in the last 168 hours. Cardiac Enzymes: No results for input(s): CKTOTAL, CKMB, CKMBINDEX, TROPONINI in the last 168 hours. BNP (last 3 results) No results for input(s): PROBNP in the last 8760 hours. HbA1C: No results for input(s): HGBA1C in the last 72 hours. CBG: No results for input(s): GLUCAP in the last 168 hours. Lipid Profile: No results for input(s): CHOL, HDL, LDLCALC, TRIG, CHOLHDL, LDLDIRECT in the last 72 hours. Thyroid Function Tests: Recent Labs    10/02/19 1348  TSH 3.286   Anemia Panel: No results for input(s):  VITAMINB12, FOLATE, FERRITIN, TIBC, IRON, RETICCTPCT in the last 72 hours. Urine analysis: No results found for: COLORURINE, APPEARANCEUR, LABSPEC, PHURINE, GLUCOSEU, HGBUR, BILIRUBINUR, KETONESUR, PROTEINUR, UROBILINOGEN, NITRITE, LEUKOCYTESUR Sepsis Labs: @LABRCNTIP (procalcitonin:4,lacticidven:4) ) Recent Results (from the past 240 hour(s))  SARS Coronavirus 2 by RT PCR (hospital order, performed in [facility] hospital lab) Nasopharyngeal Nasopharyngeal Swab     Status: None   Collection Time: 10/02/19  7:20 PM   Specimen: Nasopharyngeal Swab  Result Value Ref Range Status   SARS Coronavirus 2 NEGATIVE NEGATIVE Final    Comment: (NOTE) SARS-CoV-2 target nucleic acids are NOT DETECTED. The SARS-CoV-2 RNA is generally detectable in upper and lower respiratory specimens during the acute phase of infection. The lowest concentration of SARS-CoV-2 viral copies this assay can detect is 250 copies / mL. A negative result does not preclude SARS-CoV-2 infection and should not be used as the sole basis for treatment or other patient management decisions.  A negative result may occur with improper specimen collection / handling, submission of specimen other than nasopharyngeal swab, presence of viral mutation(s) within the areas targeted by this assay, and inadequate number of viral copies (<250 copies / mL). A negative result must be combined with clinical observations, patient history, and epidemiological information. Fact Sheet for Patients:   12/02/19 Fact Sheet for  Healthcare Providers: BankingDealers.co.za This test is not yet approved or cleared  by the [place] and has been authorized for detection and/or diagnosis of SARS-CoV-2 by FDA under an Emergency Use Authorization (EUA).  This EUA will remain in effect (meaning this test can be used) for the duration of the COVID-19 declaration under Section 564(b)(1) of the Act,  21 U.S.C. section 360bbb-3(b)(1), unless the authorization is terminated or revoked sooner. Performed at [facility], [address] [place], [place] [address]      Radiological Exams on Admission: DG Chest Portable 1 View  Result Date: 10/02/2019 CLINICAL DATA:  Bilateral leg swelling, shortness of breath EXAM: PORTABLE CHEST 1 VIEW COMPARISON:  04/10/2019 FINDINGS: Heart is borderline in size. Patchy bilateral opacities. No effusions. No acute bony abnormality. IMPRESSION: Patchy bilateral opacities could reflect edema or infection. Electronically Signed   By: Rolm Baptise M.D.   On: 10/02/2019 19:46    EKG: Sinus tach at 105 bpm with normal axis and nonspecific ST-T wave changes.  QT prolongation  Assessment/Plan  This is a 34 y.o. female with a history of DM, GERD now being admitted with:  #.  Symptomatic anemia, normocytic Admit inpatient telemetry Transfused 2 units packed red blood cells Check ferritin, FOBT, B12 and folate Will likely need heme follow up as outpatient.  Also needs to update GYN healthcare maintenance.  #.  Elevated troponin, new onset of congestive heart failure Monitor on telemetry, continuous pulse ox Intake and output O2, nebs as needed Daily Lasix, aspirin, beta-blocker and ACE inhibitor Trend troponins, check lipids and TSH Check DDimer to rule out PE Check echo Cardiology consult   #.  Hyperglycemia qwithout DKA.  Accuchecks achs with RISS coverage Heart healthy, carb controlled diet Check A1c   #. History of GERD PPI as needed  Admission status: IP, telemetry IV Fluids: Hep-Lock Diet/Nutrition: Heart healthy, carb controlled Consults called: Cardiology DVT Px: SCDs and early ambulation. Code Status: Full Code  Disposition Plan: To home in-2 days  All the records are reviewed and case discussed with ED provider. Management plans discussed with the patient and/or family who express understanding and agree with plan of  care.  Helayna Dun D.O. on 10/02/2019 at 10:01 PM CC: Primary care physician; Patient, No Pcp Per   10/02/2019, 10:01 PM

## 2019-10-02 NOTE — ED Notes (Signed)
Called no answer

## 2019-10-02 NOTE — ED Notes (Signed)
Pt provided with [place] sandwich tray and ginger ale at this time. Pt not ready for bed at this time, will admin bedtime meds when appropriate.

## 2019-10-02 NOTE — ED Notes (Signed)
Lab called for recollect of type and screen.

## 2019-10-02 NOTE — ED Notes (Signed)
Called in the waiting room with no answer. °

## 2019-10-03 ENCOUNTER — Inpatient Hospital Stay: Payer: Self-pay

## 2019-10-03 ENCOUNTER — Encounter: Payer: Self-pay | Admitting: Family Medicine

## 2019-10-03 ENCOUNTER — Inpatient Hospital Stay (HOSPITAL_COMMUNITY)
Admit: 2019-10-03 | Discharge: 2019-10-03 | Disposition: A | Discharge status: Home / Self Care | Payer: Self-pay | Attending: Family Medicine | Admitting: Family Medicine

## 2019-10-03 DIAGNOSIS — I5020 Unspecified systolic (congestive) heart failure: Secondary | ICD-10-CM

## 2019-10-03 DIAGNOSIS — R0600 Dyspnea, unspecified: Secondary | ICD-10-CM

## 2019-10-03 DIAGNOSIS — R609 Edema, unspecified: Secondary | ICD-10-CM

## 2019-10-03 DIAGNOSIS — D649 Anemia, unspecified: Secondary | ICD-10-CM

## 2019-10-03 DIAGNOSIS — R600 Localized edema: Secondary | ICD-10-CM

## 2019-10-03 DIAGNOSIS — E1165 Type 2 diabetes mellitus with hyperglycemia: Secondary | ICD-10-CM

## 2019-10-03 DIAGNOSIS — E8770 Fluid overload, unspecified: Secondary | ICD-10-CM

## 2019-10-03 LAB — CBC WITH DIFFERENTIAL/PLATELET
Abs Immature Granulocytes: 0.03 10*3/uL (ref 0.00–0.07)
Basophils Absolute: 0.1 10*3/uL (ref 0.0–0.1)
Basophils Relative: 1 %
Eosinophils Absolute: 0.1 10*3/uL (ref 0.0–0.5)
Eosinophils Relative: 2 %
HCT: 31.7 % — ABNORMAL LOW (ref 36.0–46.0)
Hemoglobin: 10.2 g/dL — ABNORMAL LOW (ref 12.0–15.0)
Immature Granulocytes: 0 %
Lymphocytes Relative: 13 %
Lymphs Abs: 1.2 10*3/uL (ref 0.7–4.0)
MCH: 26.5 pg (ref 26.0–34.0)
MCHC: 32.2 g/dL (ref 30.0–36.0)
MCV: 82.3 fL (ref 80.0–100.0)
Monocytes Absolute: 0.4 10*3/uL (ref 0.1–1.0)
Monocytes Relative: 5 %
Neutro Abs: 6.9 10*3/uL (ref 1.7–7.7)
Neutrophils Relative %: 79 %
Platelets: 455 10*3/uL — ABNORMAL HIGH (ref 150–400)
RBC: 3.85 MIL/uL — ABNORMAL LOW (ref 3.87–5.11)
RDW: 15.6 % — ABNORMAL HIGH (ref 11.5–15.5)
WBC: 8.7 10*3/uL (ref 4.0–10.5)
nRBC: 0 % (ref 0.0–0.2)

## 2019-10-03 LAB — GLUCOSE, CAPILLARY
Glucose-Capillary: 240 mg/dL — ABNORMAL HIGH (ref 70–99)
Glucose-Capillary: 241 mg/dL — ABNORMAL HIGH (ref 70–99)
Glucose-Capillary: 127 mg/dL — ABNORMAL HIGH (ref 70–99)
Glucose-Capillary: 117 mg/dL — ABNORMAL HIGH (ref 70–99)

## 2019-10-03 LAB — CBC
HCT: 29.1 % — ABNORMAL LOW (ref 36.0–46.0)
Hemoglobin: 8.7 g/dL — ABNORMAL LOW (ref 12.0–15.0)
MCH: 25.6 pg — ABNORMAL LOW (ref 26.0–34.0)
MCHC: 29.9 g/dL — ABNORMAL LOW (ref 30.0–36.0)
MCV: 85.6 fL (ref 80.0–100.0)
Platelets: 505 10*3/uL — ABNORMAL HIGH (ref 150–400)
RBC: 3.4 MIL/uL — ABNORMAL LOW (ref 3.87–5.11)
RDW: 15.1 % (ref 11.5–15.5)
WBC: 7.4 10*3/uL (ref 4.0–10.5)
nRBC: 0 % (ref 0.0–0.2)

## 2019-10-03 LAB — PREPARE RBC (CROSSMATCH)

## 2019-10-03 LAB — IRON AND TIBC
Iron: 21 ug/dL — ABNORMAL LOW (ref 28–170)
Saturation Ratios: 6 % — ABNORMAL LOW (ref 10.4–31.8)
TIBC: 328 ug/dL (ref 250–450)
UIBC: 307 ug/dL

## 2019-10-03 LAB — PHOSPHORUS: Phosphorus: 3.3 mg/dL (ref 2.5–4.6)

## 2019-10-03 LAB — URINE DRUG SCREEN, QUALITATIVE (ARMC ONLY)
Amphetamines, Ur Screen: NOT DETECTED
Barbiturates, Ur Screen: NOT DETECTED
Benzodiazepine, Ur Scrn: NOT DETECTED
Cannabinoid 50 Ng, Ur ~~LOC~~: NOT DETECTED
Cocaine Metabolite,Ur ~~LOC~~: POSITIVE — AB
MDMA (Ecstasy)Ur Screen: NOT DETECTED
Methadone Scn, Ur: NOT DETECTED
Opiate, Ur Screen: NOT DETECTED
Phencyclidine (PCP) Ur S: NOT DETECTED
Tricyclic, Ur Screen: NOT DETECTED

## 2019-10-03 LAB — COMPREHENSIVE METABOLIC PANEL
ALT: 11 U/L (ref 0–44)
AST: 21 U/L (ref 15–41)
Albumin: 2.3 g/dL — ABNORMAL LOW (ref 3.5–5.0)
Alkaline Phosphatase: 78 U/L (ref 38–126)
Anion gap: 9 (ref 5–15)
BUN: 10 mg/dL (ref 6–20)
CO2: 27 mmol/L (ref 22–32)
Calcium: 8 mg/dL — ABNORMAL LOW (ref 8.9–10.3)
Chloride: 99 mmol/L (ref 98–111)
Creatinine, Ser: 0.76 mg/dL (ref 0.44–1.00)
GFR calc Af Amer: >60 mL/min (ref >60)
GFR calc non Af Amer: >60 mL/min (ref >60)
Glucose, Bld: 232 mg/dL — ABNORMAL HIGH (ref 70–99)
Potassium: 3.5 mmol/L (ref 3.5–5.1)
Sodium: 135 mmol/L (ref 135–145)
Total Bilirubin: 1.2 mg/dL (ref 0.3–1.2)
Total Protein: 6 g/dL — ABNORMAL LOW (ref 6.5–8.1)

## 2019-10-03 LAB — BASIC METABOLIC PANEL
Anion gap: 8 (ref 5–15)
BUN: 10 mg/dL (ref 6–20)
CO2: 29 mmol/L (ref 22–32)
Calcium: 7.8 mg/dL — ABNORMAL LOW (ref 8.9–10.3)
Chloride: 100 mmol/L (ref 98–111)
Creatinine, Ser: 0.97 mg/dL (ref 0.44–1.00)
GFR calc Af Amer: >60 mL/min (ref >60)
GFR calc non Af Amer: >60 mL/min (ref >60)
Glucose, Bld: 385 mg/dL — ABNORMAL HIGH (ref 70–99)
Potassium: 3.8 mmol/L (ref 3.5–5.1)
Sodium: 137 mmol/L (ref 135–145)

## 2019-10-03 LAB — VITAMIN B12: Vitamin B-12: 324 pg/mL (ref 180–914)

## 2019-10-03 LAB — MAGNESIUM: Magnesium: 1.7 mg/dL (ref 1.7–2.4)

## 2019-10-03 LAB — FIBRIN DERIVATIVES D-DIMER (ARMC ONLY): Fibrin derivatives D-dimer (ARMC): 1574.2 ng/mL (FEU) — ABNORMAL HIGH (ref 0.00–499.00)

## 2019-10-03 LAB — PROTIME-INR
INR: 1 (ref 0.8–1.2)
Prothrombin Time: 12.7 seconds (ref 11.4–15.2)

## 2019-10-03 LAB — HEMOGLOBIN AND HEMATOCRIT, BLOOD
HCT: 27.3 % — ABNORMAL LOW (ref 36.0–46.0)
Hemoglobin: 8.6 g/dL — ABNORMAL LOW (ref 12.0–15.0)

## 2019-10-03 LAB — ECHOCARDIOGRAM COMPLETE
Height: 67 in
Weight: 3772.8 oz

## 2019-10-03 LAB — HEMOGLOBIN A1C
Hgb A1c MFr Bld: 12.1 % — ABNORMAL HIGH (ref 4.8–5.6)
Mean Plasma Glucose: 300.57 mg/dL

## 2019-10-03 LAB — APTT: aPTT: 28 seconds (ref 24–36)

## 2019-10-03 LAB — ABO/RH: ABO/RH(D): A POS

## 2019-10-03 LAB — TROPONIN I (HIGH SENSITIVITY): Troponin I (High Sensitivity): 21 ng/L — ABNORMAL HIGH (ref <18)

## 2019-10-03 LAB — HIV ANTIBODY (ROUTINE TESTING W REFLEX): HIV Screen 4th Generation wRfx: NONREACTIVE

## 2019-10-03 MED ORDER — IOHEXOL 350 MG/ML SOLN
75.0000 mL | Freq: Once | INTRAVENOUS | Status: AC | PRN
  Administered 2019-10-03: 75 mL via INTRAVENOUS

## 2019-10-03 MED ORDER — INSULIN STARTER KIT- PEN NEEDLES (ENGLISH)
1.0000 | Freq: Once | Status: AC
  Administered 2019-10-03: 1
  Filled 2019-10-03: qty 1

## 2019-10-03 MED ORDER — LISINOPRIL 5 MG PO TABS
5.0000 mg | ORAL_TABLET | Freq: Every day | ORAL | Status: DC
  Administered 2019-10-04 – 2019-10-05 (×2): 5 mg via ORAL
  Filled 2019-10-03 (×2): qty 1

## 2019-10-03 MED ORDER — FUROSEMIDE 10 MG/ML IJ SOLN
60.0000 mg | Freq: Two times a day (BID) | INTRAMUSCULAR | Status: DC
  Administered 2019-10-03 – 2019-10-04 (×3): 60 mg via INTRAVENOUS
  Filled 2019-10-03 (×3): qty 6

## 2019-10-03 MED ORDER — SODIUM CHLORIDE 0.9% FLUSH
3.0000 mL | Freq: Two times a day (BID) | INTRAVENOUS | Status: DC
  Administered 2019-10-03 – 2019-10-05 (×4): 3 mL via INTRAVENOUS

## 2019-10-03 MED ORDER — HEPARIN SODIUM (PORCINE) 5000 UNIT/ML IJ SOLN: 5000.0000 [IU] | Freq: Three times a day (TID) | INTRAMUSCULAR | Status: DC

## 2019-10-03 MED ORDER — METOPROLOL SUCCINATE ER 25 MG PO TB24
25.0000 mg | ORAL_TABLET | Freq: Every day | ORAL | Status: DC
  Administered 2019-10-04: 25 mg via ORAL
  Filled 2019-10-03: qty 1

## 2019-10-03 MED ORDER — ASPIRIN EC 81 MG PO TBEC
81.0000 mg | DELAYED_RELEASE_TABLET | Freq: Every day | ORAL | Status: DC
  Administered 2019-10-03 – 2019-10-05 (×3): 81 mg via ORAL
  Filled 2019-10-03 (×4): qty 1

## 2019-10-03 MED ORDER — LIVING WELL WITH DIABETES BOOK
Freq: Once | Status: AC
  Filled 2019-10-03: qty 1

## 2019-10-03 MED ORDER — INSULIN ASPART 100 UNIT/ML ~~LOC~~ SOLN
5.0000 [IU] | Freq: Three times a day (TID) | SUBCUTANEOUS | Status: DC
  Administered 2019-10-03 – 2019-10-05 (×7): 5 [IU] via SUBCUTANEOUS
  Filled 2019-10-03 (×7): qty 1

## 2019-10-03 MED ORDER — INSULIN GLARGINE 100 UNIT/ML ~~LOC~~ SOLN
21.0000 [IU] | Freq: Every day | SUBCUTANEOUS | Status: DC
  Administered 2019-10-03 – 2019-10-05 (×3): 21 [IU] via SUBCUTANEOUS
  Filled 2019-10-03 (×4): qty 0.21

## 2019-10-03 NOTE — Progress Notes (Signed)
Inpatient Diabetes Program Recommendations  AACE/ADA: New Consensus Statement on Inpatient Glycemic Control (2015)  Target Ranges:  Prepandial:   less than 140 mg/dL      Peak postprandial:   less than 180 mg/dL (1-2 hours)      Critically ill patients:  140 - 180 mg/dL   Lab Results  Component Value Date   GLUCAP 240 (H) 10/03/2019   HGBA1C 12.1 (H) 10/02/2019    Review of Glycemic Control Results for Molly Howard, Molly Howard (MRN [id number]) as of 10/03/2019 09:40  Ref. Range 10/02/2019 23:11 10/03/2019 08:07  Glucose-Capillary Latest Ref Range: 70 - 99 mg/dL 383 (H) 338 (H)   Diabetes history: DM2 Outpatient Diabetes medications: Metformin 500 mg bid Current orders for Inpatient glycemic control: Novolog moderate correction tid + hs 0-5 units  Inpatient Diabetes Program Recommendations:   Noted A1c 12.7 04/10/19 @ an emergency room visit. -Lantus 21 units basal insulin to be started now and qd -Novolog 5 units tid meal coverage if eats 50% meals  Ordered Living Well With Diabetes and will follow and speak with patient. Noted patient no insurance listed. Will consult transition of care team.  Thank you, Billy Fischer. Kurt Azimi, RN, MSN, CDE  Diabetes Coordinator Inpatient Glycemic Control Team Team Pager [phone] (8am-5pm) 10/03/2019 9:52 AM

## 2019-10-03 NOTE — Progress Notes (Addendum)
Inpatient Diabetes Program Recommendations  AACE/ADA: New Consensus Statement on Inpatient Glycemic Control (2015)  Target Ranges:  Prepandial:   less than 140 mg/dL      Peak postprandial:   less than 180 mg/dL (1-2 hours)      Critically ill patients:  140 - 180 mg/dL   Lab Results  Component Value Date   GLUCAP 241 (H) 10/03/2019   HGBA1C 12.1 (H) 10/02/2019   Spoke with pt @ bedside about A1C 12.1 (average blood glucose 301 over the past 2-3 months) and explained what an A1C is, basic pathophysiology of DM Type 2, basic home care, basic diabetes diet nutrition principles, importance of checking CBGs and maintaining good CBG control to prevent long-term and short-term complications. Reviewed signs and symptoms of hyperglycemia and hypoglycemia and how to treat hypoglycemia at home. Also reviewed blood sugar goals at home.  RNs to provide ongoing basic DM education at bedside with this patient. Have ordered educational booklet & insulin starter kit. Educated patient on insulin pen use at home. Reviewed contents of insulin flexpen starter kit. Reviewed all steps if insulin pen including attachment of needle, 2-unit air shot, dialing up dose, giving injection, removing needle, disposal of sharps, storage of unused insulin, disposal of insulin etc. Patient able to provide successful return demonstration. Also reviewed troubleshooting with insulin pen. MD to give patient Rxs for insulin pens and insulin pen needles.  Patient drinks mostly water but eats more carbohydrates. Reviewed basic plate method with patient and patient willing to decrease carbohydrates and increase walking. Patient states she has employment and benefits will start in additional 5 months. Patient states ability to buy Novolin Relion 70/30 insulin pens from [employer]. On Discharge patient will need: # [id number] Relion 70/30 insulin #  [id number] Pen Needles Patient has glucose meter and strips @ home  Thank you, Nani Gasser. Alany Borman, RN,  MSN, CDE  Diabetes Coordinator Inpatient Glycemic Control Team Team Pager [phone] (8am-5pm) 10/03/2019 2:29 PM

## 2019-10-03 NOTE — Progress Notes (Signed)
PROGRESS NOTE    Molly Howard  BWG:[id number] DOB: 04-Jun-1985 DOA: 10/02/2019 PCP: Patient, No Pcp Per   Brief Narrative:  HPI per Dr. Harvie Bridge on 10/02/19 Molly Howard is a 34 y.o. female with a known history of DM, GERD presents to the emergency department for evaluation of leg swelling.  Patient was in a usual state of health until 1 week ago when she developed bilateral leg swelling which has been been progressively worsening as well as intermittent shortness of breath and PND.  Dyspnea on exertion has also become worsening over the past 24 hours. On further questioning she reports awakening at night on occasion with palpitations.  Denies any known cardiac history.  Denies drug or alcohol uise  Patient denies fevers/chills, weakness, dizziness, chest pain,  N/V/C/D, abdominal pain, dysuria/frequency, changes in mental status, heavy vaginal bleeding.   Otherwise there has been no change in status. Patient has been taking medication as prescribed and there has been no recent change in medication or diet.  No recent antibiotics.  There has been no recent illness, hospitalizations, travel or sick contacts.    EMS/ED Course: Patient received Lasix. Medical admission has been requested for further management of new onset of congestive heart failure, symptomatic anemia-normocytic without active bleeding, hyperglycemia without DKA.  **Interim History  She is typed and screened and transfused 2 units of PRBCs.  Cardiology was consulted for further evaluation recommendations.  CTA did not show any evidence of a PE but did show scattered groundglass infiltrates and bilateral pleural effusions fairing pulmonary edema  Assessment & Plan:   Active Problems:   Symptomatic anemia  Symptomatic Anemia, normocytic -Admit inpatient telemetry -She presented with a Hgb/Hct of 8.8/28.2 and repeat was 8.6/27.3; She was typed and screened and transfused 2 units of pRBC's -After Transfusion of 2 units  packed red blood cells her Hgb/Hct this AM was 10.2/31.7 -Anemia panel was checked and showed an iron level of 21, U IBC 307, TIBC 328, saturation ratios of 6%, ferritin level 3, folate level 8.6, and vitamin B12 level of 324 -FOBT still pending -Continue to monitor for signs and symptoms of bleeding; currently no overt bleeding noted -Will likely need Heme follow up as outpatient.  Also needs to update GYN healthcare maintenance.  Elevated Troponin Volume Overload and Dyspnea associated with LE Edema; Concern for New onset of congestive heart failure r/o other Etiologies including PE -Continue to Monitor on Telemetry -BNP was 391.0 on Admission and Troponin went from 19 -> 21 -> 21 -Continue to Monitor Stric Intake and output, and Daily Weights -C/w Supplemental O2 via [place] and Wean patient to Room Air -Continuous Pulse Oximetry and Maintain O2 Sats >92% -SpO2: 99 % -C/w nebs as needed -Started on Daily Lasix and given IV 60 mg yesterday and received IV 20 mg this AM -C/w Aspirin 81 mg po Daily, Beta-Blocker with Metoprolol Succinate 12.5 mg po Daily and ACE inhibitor with Lisinopril 2.5 mg Daily  -TSH was  -Checked Fibrin Derivatives D Dimer was 1574.20 to rule out PE and will get CTA Chest and LE Venous Duplex -CTA Chest showed "No evidence of pulmonary embolism. Diffuse interseptal thickening, scattered ground-glass infiltrates, and BILATERAL pleural effusions, favor pulmonary edema. Compressive atelectasis at lung bases. Enlargement of cardiac chambers. 6 mm RIGHT lower lobe pulmonary nodule unchanged;" -Check LE Venous Duplex and showed "No evidence of DVT within either lower extremity." -Check Cardiac Transthoracic ECHOCardiogram -Cardiology consulted for further evaluation and recommendations; Appreciate their assistance  -Will need  an Ambulatory Home O2 screen prior to D/C   Uncontrolled Diabetes Mellitus Type 2 with Hyperglycemia without DKA.  -C/w Heart healthy, carb controlled  diet -Checked A1c and was 12.6 -CBGs ranging from 240-372 -Started the patient on insulin glargine 21 units subcu daily along with moderate NovoLog/scale insulin before meals and at bedtime -We will have the patient on 5 units subcu 3 times daily with meals  -Diabetes education coordinator consulted for further evaluation recommendations appreciate their assistance -Continue to monitor blood sugars carefully and adjust insulin regimen as necessary  Right lower lobe pulmonary nodule -Incidental finding -Was 6 mm and stable so recommending a repeat scan in 18 months   History of GERD -PPI as needed  Thrombocytosis -Patient's Platelet Count was 527 -> 505 -> 455 -Continue to Monitor carefully -Repeat CBC in the AM  Obesity -Estimated body mass index is 36.93 kg/m as calculated from the following:   Height as of this encounter: '5\' 7"'  (1.702 m).   Weight as of this encounter: 107 kg. -Weight Loss and Dietary Counseling given    DVT prophylaxis: SCDs; pharmacological prophylaxis held given her acute anemia Code Status: FULL CODE  Family Communication: No family present at bedside Disposition Plan: Remain inpatient for further evaluation and recommendations by cardiology as well as continue diuresis  Status is: Inpatient  Remains inpatient appropriate because:Unsafe d/c plan, IV treatments appropriate due to intensity of illness or inability to take PO and Inpatient level of care appropriate due to severity of illness   Dispo: The patient is from: Home              Anticipated d/c is to: TBD              Anticipated d/c date is: 2 days              Patient currently is not medically stable to d/c.  Consultants:    Cardiology   Procedures:  ECHOCardiogram  LE Venous Duplex; Showed no DVT Bilaterally   Antimicrobials:  Anti-infectives (From admission, onward)   None     Subjective: And examined at bedside states that she has had intermittent shortness of breath.   States her legs have been more swollen for last 2 weeks or so.  No chest pain, lightheadedness or dizziness.  Leg swelling is minimally improved and she denies any other concerns or complaints at this time.  Objective: Vitals:   10/03/19 0645 10/03/19 0806 10/03/19 1008 10/03/19 1038  BP: (!) 151/103 (!) 149/100 (!) 150/103 (!) 149/101  Pulse: (!) 103 (!) 101  (!) 101  Resp:  17  20  Temp:  98.2 F (36.8 C)  98.7 F (37.1 C)  TempSrc:  Oral  Oral  SpO2: 96% 96%  99%  Weight:      Height:        Intake/Output Summary (Last 24 hours) at 10/03/2019 1122 Last data filed at 10/03/2019 1030 Gross per 24 hour  Intake 816.7 ml  Output 725 ml  Net 91.7 ml   Filed Weights   10/02/19 1345 10/02/19 2259 10/03/19 0340  Weight: 97.5 kg 106.6 kg 107 kg   Examination: Physical Exam:  Constitutional: WN/WD obese African-American female currently in NAD and appears calm  Eyes: Lids and conjunctivae normal, sclerae anicteric  ENMT: External Ears, Nose appear normal. Grossly normal hearing.  Neck: Appears normal, supple, no cervical masses, normal ROM, no appreciable thyromegaly;  Respiratory: Diminished to auscultation bilaterally with coarse breath sounds and some mild  crackles diffusely scattered.  No appreciable wheezing, rales.  Unlabored breathing and she is not wearing supplemental oxygen via nasal cannula currently Cardiovascular: RRR, no murmurs / rubs / gallops. S1 and S2 auscultated.  1+ extremity edema bilaterally. Abdomen: Soft, non-tender, Distended due to body habitus. Bowel sounds positive.  GU: Deferred. Musculoskeletal: No clubbing / cyanosis of digits/nails. No joint deformity upper and lower extremities.  Skin: No rashes, lesions, ulcers on a limited skin evaluation. No induration; Warm and dry.  Neurologic: CN 2-12 grossly intact with no focal deficits. Romberg sign and cerebellar reflexes not assessed.  Psychiatric: Normal judgment and insight. Alert and oriented x 3. Normal  mood and appropriate affect.   Data Reviewed: I have personally reviewed following labs and imaging studies  CBC: Recent Labs  Lab 10/02/19 1348 10/02/19 2357 10/03/19 0500  WBC 8.0 7.4  --   HGB 8.8* 8.7* 8.6*  HCT 28.2* 29.1* 27.3*  MCV 82.9 85.6  --   PLT 527* 505*  --    Basic Metabolic Panel: Recent Labs  Lab 10/02/19 1348 10/02/19 2124 10/02/19 2357  NA 135  --  137  K 3.6  --  3.8  CL 100  --  100  CO2 27  --  29  GLUCOSE 259*  --  385*  BUN 9  --  10  CREATININE 0.82  --  0.97  CALCIUM 7.9*  --  7.8*  MG  --  1.8  --   PHOS  --  3.6  --    GFR: Estimated Creatinine Clearance: 102 mL/min (by C-G formula based on SCr of 0.97 mg/dL). Liver Function Tests: Recent Labs  Lab 10/02/19 1348  AST 14*  ALT 12  ALKPHOS 84  BILITOT 0.7  PROT 6.3*  ALBUMIN 2.2*   No results for input(s): LIPASE, AMYLASE in the last 168 hours. No results for input(s): AMMONIA in the last 168 hours. Coagulation Profile: Recent Labs  Lab 10/02/19 2357  INR 1.0   Cardiac Enzymes: No results for input(s): CKTOTAL, CKMB, CKMBINDEX, TROPONINI in the last 168 hours. BNP (last 3 results) No results for input(s): PROBNP in the last 8760 hours. HbA1C: Recent Labs    10/02/19 2124  HGBA1C 12.1*   CBG: Recent Labs  Lab 10/02/19 2311 10/03/19 0807  GLUCAP 372* 240*   Lipid Profile: No results for input(s): CHOL, HDL, LDLCALC, TRIG, CHOLHDL, LDLDIRECT in the last 72 hours. Thyroid Function Tests: Recent Labs    10/02/19 1348  TSH 3.286   Anemia Panel: Recent Labs    10/02/19 2124 10/02/19 2357  VITAMINB12 324  --   FOLATE 8.6  --   FERRITIN 3*  --   TIBC  --  328  IRON  --  21*   Sepsis Labs: No results for input(s): PROCALCITON, LATICACIDVEN in the last 168 hours.  Recent Results (from the past 240 hour(s))  SARS Coronavirus 2 by RT PCR (hospital order, performed in [facility] hospital lab) Nasopharyngeal Nasopharyngeal Swab     Status: None   Collection  Time: 10/02/19  7:20 PM   Specimen: Nasopharyngeal Swab  Result Value Ref Range Status   SARS Coronavirus 2 NEGATIVE NEGATIVE Final    Comment: (NOTE) SARS-CoV-2 target nucleic acids are NOT DETECTED. The SARS-CoV-2 RNA is generally detectable in upper and lower respiratory specimens during the acute phase of infection. The lowest concentration of SARS-CoV-2 viral copies this assay can detect is 250 copies / mL. A negative result does not preclude SARS-CoV-2  infection and should not be used as the sole basis for treatment or other patient management decisions.  A negative result may occur with improper specimen collection / handling, submission of specimen other than nasopharyngeal swab, presence of viral mutation(s) within the areas targeted by this assay, and inadequate number of viral copies (<250 copies / mL). A negative result must be combined with clinical observations, patient history, and epidemiological information. Fact Sheet for Patients:   StrictlyIdeas.no Fact Sheet for Healthcare Providers: BankingDealers.co.za This test is not yet approved or cleared  by the [place] FDA and has been authorized for detection and/or diagnosis of SARS-CoV-2 by FDA under an Emergency Use Authorization (EUA).  This EUA will remain in effect (meaning this test can be used) for the duration of the COVID-19 declaration under Section 564(b)(1) of the Act, 21 U.S.C. section 360bbb-3(b)(1), unless the authorization is terminated or revoked sooner. Performed at [facility], [address] [place] [address]      RN Pressure Injury Documentation:     Estimated body mass index is 36.93 kg/m as calculated from the following:   Height as of this encounter: '5\' 7"'  (1.702 m).   Weight as of this encounter: 107 kg.  Malnutrition Type:      Malnutrition Characteristics:      Nutrition Interventions:    Radiology  Studies: DG Chest Portable 1 View  Result Date: 10/02/2019 CLINICAL DATA:  Bilateral leg swelling, shortness of breath EXAM: PORTABLE CHEST 1 VIEW COMPARISON:  04/10/2019 FINDINGS: Heart is borderline in size. Patchy bilateral opacities. No effusions. No acute bony abnormality. IMPRESSION: Patchy bilateral opacities could reflect edema or infection. Electronically Signed   By: Rolm Baptise M.D.   On: 10/02/2019 19:46   Scheduled Meds: . aspirin EC  81 mg Oral Daily  . furosemide  20 mg Intravenous Daily  . insulin aspart  0-15 Units Subcutaneous TID WC  . insulin aspart  0-5 Units Subcutaneous QHS  . insulin aspart  5 Units Subcutaneous TID WC  . insulin glargine  21 Units Subcutaneous Daily  . insulin starter kit- pen needles  1 kit Other Once  . lisinopril  2.5 mg Oral Daily  . living well with diabetes book   Does not apply Once  . melatonin  5 mg Oral QHS  . metoprolol succinate  12.5 mg Oral Daily   Continuous Infusions:   LOS: 1 day   Kerney Elbe, DO [facility] PAGER is on [place]  If 7PM-7AM, please contact night-coverage [email or web address]

## 2019-10-03 NOTE — Plan of Care (Signed)
  Problem: Education: Goal: Knowledge of General Education information will improve Description Including pain rating scale, medication(s)/side effects and non-pharmacologic comfort measures Outcome: Progressing   

## 2019-10-03 NOTE — Progress Notes (Signed)
*  PRELIMINARY RESULTS* Echocardiogram 2D Echocardiogram has been performed.  Molly Howard 10/03/2019, 9:47 AM

## 2019-10-03 NOTE — Consult Note (Addendum)
Cardiology Consultation:   Patient ID: MONIFA BLANCHETTE; [id number]; 07-28-1985   Admit date: 10/02/2019 Date of Consult: 10/03/2019  Primary Care Provider: Patient, No Pcp Per Primary Cardiologist: new to [facility] - consult by [place]   Patient Profile:   Molly Howard is a 34 y.o. female with a hx of DM and GERD who is being seen today for the evaluation of SOB and lower extremity swelling at the request of Dr. Marland Mcalpine.  History of Present Illness:   Ms. Cimino has no previously known cardiac history.  She was seen in the ED in 04/2019 with atypical chest pain felt to be GERD and advised to follow up with cardiology. This was not completed. Over the past 1 week she has noted abrupt onset of progressive SOB and lower extremity swelling. There has been some associated abdominal swelling, weight gain (though uncertain amount), and a cough that has been productive of clear sputum. She denied any changes to her diet or PTA medication. She reports eating fast food ~ 2/week. She reports a history of menorrhagia, though this has been stable over the years. She denies any hematochezia, melena, hematemesis, hemoptysis, or hematuria. No chest pain, dizziness, presyncope, or syncope.   In the ED she was noted to be anemia with a HGB of 8.8 (prior 13.6 in 04/2019), hypoalbuminemic at 3.0-->2.2, have an elevated D-dimer, HS-Tn 19 with a delta and peak of 21, BNP 391. In the ED, she was given 60 mg of IV Lasix and an albuterol neb. Occult blood, echo, lower extremity ultrasound, and chest CTA are all pending. BP has ranged from the 140s to 160s systolic. EKG showed sinus tachycardia, 105 bpm, RAD, nonspecific st/t changes. She has been given 2 units of blood with a HGB of 10.2 this afternoon. Upon admission, she was placed on IV Lasix 20 mg daily. Currently, notes a mild improvement in her lower extremity swelling and dyspnea following pRBC and IV diuresis.    Past Medical History:  Diagnosis Date  . Acid  reflux   . Diabetes mellitus (HCC)   . Obesity     Past Surgical History:  Procedure Laterality Date  . CHOLECYSTECTOMY    . HERNIA REPAIR       Home Meds: Prior to Admission medications   Medication Sig Start Date End Date Taking? Authorizing Provider  metFORMIN (GLUCOPHAGE) 500 MG tablet Take 500 mg by mouth 2 (two) times daily with a meal.   Yes [provider]    Inpatient Medications: Scheduled Meds: . aspirin EC  81 mg Oral Daily  . furosemide  20 mg Intravenous Daily  . insulin aspart  0-15 Units Subcutaneous TID WC  . insulin aspart  0-5 Units Subcutaneous QHS  . insulin aspart  5 Units Subcutaneous TID WC  . insulin glargine  21 Units Subcutaneous Daily  . lisinopril  2.5 mg Oral Daily  . melatonin  5 mg Oral QHS  . metoprolol succinate  12.5 mg Oral Daily   Continuous Infusions:  PRN Meds: acetaminophen **OR** acetaminophen, albuterol, bisacodyl, HYDROcodone-acetaminophen, ipratropium, morphine injection, ondansetron **OR** ondansetron (ZOFRAN) IV, senna-docusate  Allergies:   Allergies  Allergen Reactions  . No [employer] Food Allergies Rash and Other (See Comments)    Lemons    Social History:   Social History   Socioeconomic History  . Marital status: Single    Spouse name: Not on file  . Number of children: Not on file  . Years of education: Not on  file  . Highest education level: Not on file  Occupational History  . Not on file  Tobacco Use  . Smoking status: Current Every Day Smoker    Types: Cigarettes  . Smokeless tobacco: Never Used  Substance and Sexual Activity  . Alcohol use: Yes    Comment: ~ 4 shots/day on weekends only  . Drug use: Yes    Types: Cocaine  . Sexual activity: Not on file  Other Topics Concern  . Not on file  Social History Narrative  . Not on file   Social Determinants of Health   Financial Resource Strain:   . Difficulty of Paying Living Expenses:   Food Insecurity:   . Worried About Patent examiner in the Last Year:   . Barista in the Last Year:   Transportation Needs:   . Freight forwarder (Medical):   Marland Kitchen Lack of Transportation (Non-Medical):   Physical Activity:   . Days of Exercise per Week:   . Minutes of Exercise per Session:   Stress:   . Feeling of Stress :   Social Connections:   . Frequency of Communication with Friends and Family:   . Frequency of Social Gatherings with Friends and Family:   . Attends Religious Services:   . Active Member of Clubs or Organizations:   . Attends Banker Meetings:   Marland Kitchen Marital Status:   Intimate Partner Violence:   . Fear of Current or Ex-Partner:   . Emotionally Abused:   Marland Kitchen Physically Abused:   . Sexually Abused:      Family History:  Family History  Problem Relation Age of Onset  . Heart failure Mother        a. onset in her 88s  . Diabetes Mother   . Hypertension Father   . Diabetes Father     ROS:  Review of Systems  Constitutional: Positive for malaise/fatigue. Negative for chills, diaphoresis, fever and weight loss.  HENT: Negative for congestion.   Eyes: Negative for discharge and redness.  Respiratory: Positive for cough, sputum production and shortness of breath. Negative for hemoptysis and wheezing.        Clear sputum   Cardiovascular: Positive for palpitations, orthopnea and leg swelling. Negative for chest pain, claudication and PND.       Stable 3-pillow orthopnea, which has been present for years  Gastrointestinal: Negative for abdominal pain, blood in stool, constipation, diarrhea, heartburn, melena, nausea and vomiting.  Genitourinary: Negative for hematuria.  Musculoskeletal: Negative for falls and myalgias.  Skin: Negative for rash.  Neurological: Positive for weakness. Negative for dizziness, tingling, tremors, sensory change, speech change, focal weakness and loss of consciousness.  Endo/Heme/Allergies: Does not bruise/bleed easily.  Psychiatric/Behavioral: Negative for  substance abuse. The patient is not nervous/anxious.   All other systems reviewed and are negative.     Physical Exam/Data:   Vitals:   10/03/19 0645 10/03/19 0806 10/03/19 1008 10/03/19 1038  BP: (!) 151/103 (!) 149/100 (!) 150/103 (!) 149/101  Pulse: (!) 103 (!) 101  (!) 101  Resp:  17  20  Temp:  98.2 F (36.8 C)  98.7 F (37.1 C)  TempSrc:  Oral  Oral  SpO2: 96% 96%  99%  Weight:      Height:        Intake/Output Summary (Last 24 hours) at 10/03/2019 1344 Last data filed at 10/03/2019 1030 Gross per 24 hour  Intake 816.7 ml  Output 725 ml  Net 91.7 ml   Filed Weights   10/02/19 1345 10/02/19 2259 10/03/19 0340  Weight: 97.5 kg 106.6 kg 107 kg   Body mass index is 36.93 kg/m.   Physical Exam: General: Well developed, well nourished, in no acute distress. Head: Normocephalic, atraumatic, sclera non-icteric, no xanthomas, nares without discharge.  Neck: Negative for carotid bruits. JVD elevated to the angle of the mandible. Lungs: Diminished breath sounds along with crackles at the bases. Breathing is unlabored. Heart: Mildly tachycardic with S1 S2. No murmurs, rubs, or gallops appreciated. Abdomen: Soft, non-tender, non-distended with normoactive bowel sounds. No hepatomegaly. No rebound/guarding. No obvious abdominal masses. Msk:  Strength and tone appear normal for age. Extremities: No clubbing or cyanosis. 1-2+ bilateral pitting edema to the knees. Distal pedal pulses are 2+ and equal bilaterally. Neuro: Alert and oriented X 3. No facial asymmetry. No focal deficit. Moves all extremities spontaneously. Psych:  Responds to questions appropriately with a normal affect.   EKG:  The EKG was personally reviewed and demonstrates: sinus tachycardia, 105 bpm, right axis deviation, nonspecific st/t changes Telemetry:  Telemetry was personally reviewed and demonstrates: sinus tachycardia, low 100s bpm  Weights: Filed Weights   10/02/19 1345 10/02/19 2259 10/03/19 0340    Weight: 97.5 kg 106.6 kg 107 kg    Relevant CV Studies: Echo pending  Laboratory Data:  Chemistry Recent Labs  Lab 10/02/19 1348 10/02/19 2357 10/03/19 1202  NA 135 137 135  K 3.6 3.8 3.5  CL 100 100 99  CO2 27 29 27   GLUCOSE 259* 385* 232*  BUN 9 10 10   CREATININE 0.82 0.97 0.76  CALCIUM 7.9* 7.8* 8.0*  GFRNONAA >60 >60 >60  GFRAA >60 >60 >60  ANIONGAP 8 8 9     Recent Labs  Lab 10/02/19 1348 10/03/19 1202  PROT 6.3* 6.0*  ALBUMIN 2.2* 2.3*  AST 14* 21  ALT 12 11  ALKPHOS 84 78  BILITOT 0.7 1.2   Hematology Recent Labs  Lab 10/02/19 1348 10/02/19 1348 10/02/19 2357 10/03/19 0500 10/03/19 1202  WBC 8.0  --  7.4  --  8.7  RBC 3.40*  --  3.40*  --  3.85*  HGB 8.8*   < > 8.7* 8.6* 10.2*  HCT 28.2*   < > 29.1* 27.3* 31.7*  MCV 82.9  --  85.6  --  82.3  MCH 25.9*  --  25.6*  --  26.5  MCHC 31.2  --  29.9*  --  32.2  RDW 15.1  --  15.1  --  15.6*  PLT 527*  --  505*  --  455*   < > = values in this interval not displayed.   Cardiac EnzymesNo results for input(s): TROPONINI in the last 168 hours. No results for input(s): TROPIPOC in the last 168 hours.  BNP Recent Labs  Lab 10/02/19 1348  BNP 391.0*    DDimer No results for input(s): DDIMER in the last 168 hours.  Radiology/Studies:  DG Chest Portable 1 View  Result Date: 10/02/2019 IMPRESSION: Patchy bilateral opacities could reflect edema or infection. Electronically Signed   By: 12/03/19 M.D.   On: 10/02/2019 19:46    Assessment and Plan:   1. Dyspnea/lower extremity swelling/systolic dysfunction: -Preliminary read on echo with an EF of 40%, await formal read and further work up of her comorbid conditions for further recommendations   -Will likely need ischemic evaluation, recommend R/LHC, and possible cMRI -Await chest CTA, and lower extremity ultrasound  -BNP is mildly  elevated  -IV Lasix with KCl repletion as indicated  -Symptoms are multifactorial including systolic dysfunction,  anemia and hypoalbuminemia -Escalate GDMT including transition to Entresto, following ACEi washout, and Toprol XL -IM to rule out DVT/PE -Daily weights -Strict I/O   2. Elevated HS-Tn: -Minimally elevated and not consistent with ACS -Supply demand ischemia in the setting of anemia -Evaluate for PE as above per IM -No role for heparin gtt at this time without dynamic elevation and in the setting of anemia  -Echo pending  3. Anemia: -Improved to 10.2 following 2 units of pRBC -Contributing to her overall presentation -Management per IM  4. Hypoalbuminemia: -Likely contributing to third spacing playing a role in her lower extremity swelling -Management per primary service   5. Uncontrolled diabetes: -Per IM  6. Sinus tachycardia: -Compensatory in the setting of her dyspnea and anemia -CTA chest to evaluate for PE  7. Substance use: -She denies illegal drugs at time of cardiology consult, though cocaine is listed in her chart -Check UDS -Complete cessation of substances is recommended     For questions or updates, please contact [place] Please consult [email or web address] for contact info under Cardiology/STEMI.   Signed, Christell Faith, PA-C [place] Pager: [phone] 10/03/2019, 1:44 PM

## 2019-10-04 DIAGNOSIS — I5021 Acute systolic (congestive) heart failure: Secondary | ICD-10-CM

## 2019-10-04 DIAGNOSIS — I5041 Acute combined systolic (congestive) and diastolic (congestive) heart failure: Secondary | ICD-10-CM

## 2019-10-04 DIAGNOSIS — F1410 Cocaine abuse, uncomplicated: Secondary | ICD-10-CM

## 2019-10-04 LAB — TYPE AND SCREEN
ABO/RH(D): A POS
Antibody Screen: NEGATIVE
Unit division: 0
Unit division: 0

## 2019-10-04 LAB — CBC WITH DIFFERENTIAL/PLATELET
Abs Immature Granulocytes: 0.02 10*3/uL (ref 0.00–0.07)
Basophils Absolute: 0.1 10*3/uL (ref 0.0–0.1)
Basophils Relative: 1 %
Eosinophils Absolute: 0.3 10*3/uL (ref 0.0–0.5)
Eosinophils Relative: 4 %
HCT: 30.3 % — ABNORMAL LOW (ref 36.0–46.0)
Hemoglobin: 9.4 g/dL — ABNORMAL LOW (ref 12.0–15.0)
Immature Granulocytes: 0 %
Lymphocytes Relative: 22 %
Lymphs Abs: 1.7 10*3/uL (ref 0.7–4.0)
MCH: 26 pg (ref 26.0–34.0)
MCHC: 31 g/dL (ref 30.0–36.0)
MCV: 83.9 fL (ref 80.0–100.0)
Monocytes Absolute: 0.6 10*3/uL (ref 0.1–1.0)
Monocytes Relative: 8 %
Neutro Abs: 5 10*3/uL (ref 1.7–7.7)
Neutrophils Relative %: 65 %
Platelets: 458 10*3/uL — ABNORMAL HIGH (ref 150–400)
RBC: 3.61 MIL/uL — ABNORMAL LOW (ref 3.87–5.11)
RDW: 15.8 % — ABNORMAL HIGH (ref 11.5–15.5)
WBC: 7.7 10*3/uL (ref 4.0–10.5)
nRBC: 0 % (ref 0.0–0.2)

## 2019-10-04 LAB — COMPREHENSIVE METABOLIC PANEL
ALT: 11 U/L (ref 0–44)
AST: 14 U/L — ABNORMAL LOW (ref 15–41)
Albumin: 2 g/dL — ABNORMAL LOW (ref 3.5–5.0)
Alkaline Phosphatase: 67 U/L (ref 38–126)
Anion gap: 7 (ref 5–15)
BUN: 11 mg/dL (ref 6–20)
CO2: 29 mmol/L (ref 22–32)
Calcium: 7.9 mg/dL — ABNORMAL LOW (ref 8.9–10.3)
Chloride: 100 mmol/L (ref 98–111)
Creatinine, Ser: 0.8 mg/dL (ref 0.44–1.00)
GFR calc Af Amer: >60 mL/min (ref >60)
GFR calc non Af Amer: >60 mL/min (ref >60)
Glucose, Bld: 141 mg/dL — ABNORMAL HIGH (ref 70–99)
Potassium: 3.2 mmol/L — ABNORMAL LOW (ref 3.5–5.1)
Sodium: 136 mmol/L (ref 135–145)
Total Bilirubin: 0.5 mg/dL (ref 0.3–1.2)
Total Protein: 5.5 g/dL — ABNORMAL LOW (ref 6.5–8.1)

## 2019-10-04 LAB — BPAM RBC
Blood Product Expiration Date: 202106182359
Blood Product Expiration Date: 202106252359
ISSUE DATE / TIME: 202106020108
ISSUE DATE / TIME: 202106020622
Unit Type and Rh: 6200
Unit Type and Rh: 6200

## 2019-10-04 LAB — GLUCOSE, CAPILLARY
Glucose-Capillary: 166 mg/dL — ABNORMAL HIGH (ref 70–99)
Glucose-Capillary: 98 mg/dL (ref 70–99)
Glucose-Capillary: 127 mg/dL — ABNORMAL HIGH (ref 70–99)
Glucose-Capillary: 146 mg/dL — ABNORMAL HIGH (ref 70–99)

## 2019-10-04 LAB — PHOSPHORUS: Phosphorus: 4.6 mg/dL (ref 2.5–4.6)

## 2019-10-04 LAB — MAGNESIUM: Magnesium: 1.6 mg/dL — ABNORMAL LOW (ref 1.7–2.4)

## 2019-10-04 MED ORDER — INSULIN ASPART 100 UNIT/ML ~~LOC~~ SOLN
0.0000 [IU] | Freq: Three times a day (TID) | SUBCUTANEOUS | Status: DC
  Administered 2019-10-04: 1 [IU] via SUBCUTANEOUS
  Administered 2019-10-05 (×2): 2 [IU] via SUBCUTANEOUS
  Filled 2019-10-04 (×4): qty 1

## 2019-10-04 MED ORDER — CARVEDILOL 3.125 MG PO TABS
3.1250 mg | ORAL_TABLET | Freq: Two times a day (BID) | ORAL | Status: DC
  Administered 2019-10-04 – 2019-10-05 (×2): 3.125 mg via ORAL
  Filled 2019-10-04 (×3): qty 1

## 2019-10-04 MED ORDER — MAGNESIUM SULFATE 2 GM/50ML IV SOLN
2.0000 g | Freq: Once | INTRAVENOUS | Status: AC
  Administered 2019-10-04: 2 g via INTRAVENOUS
  Filled 2019-10-04: qty 50

## 2019-10-04 MED ORDER — INSULIN ASPART 100 UNIT/ML ~~LOC~~ SOLN: 0.0000 [IU] | Freq: Every day | SUBCUTANEOUS | Status: DC

## 2019-10-04 MED ORDER — POTASSIUM CHLORIDE CRYS ER 20 MEQ PO TBCR
40.0000 meq | EXTENDED_RELEASE_TABLET | Freq: Three times a day (TID) | ORAL | Status: AC
  Administered 2019-10-04 (×3): 40 meq via ORAL
  Filled 2019-10-04 (×3): qty 2

## 2019-10-04 NOTE — Progress Notes (Signed)
Progress Note  Patient Name: Molly Howard Date of Encounter: 10/04/2019  Primary Cardiologist: New to [facility] - consult by Agbor-Etang  Subjective   Dyspnea and lower extremity swelling improving. No chest pain. Documented UOP 1.6 L for the past 24 hours with a net - 1.3 L for the admission. Weight 107-->106.7 kg. Renal function stable. Potassium 3.2 this morning. HGB 10.2 s/p 2 units pRBC trending to 9.4 this morning. Magnesium 1.6.   Inpatient Medications    Scheduled Meds: . aspirin EC  81 mg Oral Daily  . furosemide  60 mg Intravenous BID  . insulin aspart  0-15 Units Subcutaneous TID WC  . insulin aspart  0-5 Units Subcutaneous QHS  . insulin aspart  5 Units Subcutaneous TID WC  . insulin glargine  21 Units Subcutaneous Daily  . lisinopril  5 mg Oral Daily  . melatonin  5 mg Oral QHS  . metoprolol succinate  25 mg Oral Daily  . sodium chloride flush  3 mL Intravenous Q12H   Continuous Infusions:  PRN Meds: acetaminophen **OR** acetaminophen, albuterol, bisacodyl, HYDROcodone-acetaminophen, ipratropium, morphine injection, ondansetron **OR** ondansetron (ZOFRAN) IV, senna-docusate   Vital Signs    Vitals:   10/03/19 1944 10/03/19 1945 10/04/19 0435 10/04/19 0806  BP: (!) 149/104 136/90 (!) 131/94 (!) 129/93  Pulse: 99 99 89 99  Resp: 16  20 17   Temp: 98.4 F (36.9 C)  98.5 F (36.9 C) 98.5 F (36.9 C)  TempSrc: Oral  Oral Oral  SpO2: 98%  97% 96%  Weight:   106.7 kg   Height:        Intake/Output Summary (Last 24 hours) at 10/04/2019 0828 Last data filed at 10/04/2019 0438 Gross per 24 hour  Intake 520 ml  Output 2125 ml  Net -1605 ml   Filed Weights   10/02/19 2259 10/03/19 0340 10/04/19 0435  Weight: 106.6 kg 107 kg 106.7 kg    Telemetry    SR with rare PVC - Personally Reviewed  ECG    No new tracings - Personally Reviewed  Physical Exam   GEN: No acute distress.   Neck: No JVD. Cardiac: RRR, no murmurs, rubs, or gallops.  Respiratory:  Improving breath sounds, mildly diminished along the bilateral bases.  GI: Soft, nontender, non-distended.   MS: Trace bilateral lower extremity pitting edema to the mid shins; No deformity. Neuro:  Alert and oriented x 3; Nonfocal.  Psych: Normal affect.  Labs    Chemistry Recent Labs  Lab 10/02/19 1348 10/02/19 1348 10/02/19 2357 10/03/19 1202 10/04/19 0555  NA 135   < > 137 135 136  K 3.6   < > 3.8 3.5 3.2*  CL 100   < > 100 99 100  CO2 27   < > 29 27 29   GLUCOSE 259*   < > 385* 232* 141*  BUN 9   < > 10 10 11   CREATININE 0.82   < > 0.97 0.76 0.80  CALCIUM 7.9*   < > 7.8* 8.0* 7.9*  PROT 6.3*  --   --  6.0* 5.5*  ALBUMIN 2.2*  --   --  2.3* 2.0*  AST 14*  --   --  21 14*  ALT 12  --   --  11 11  ALKPHOS 84  --   --  78 67  BILITOT 0.7  --   --  1.2 0.5  GFRNONAA >60   < > >60 >60 >60  GFRAA >60   < > >  60 >60 >60  ANIONGAP 8   < > 8 9 7    < > = values in this interval not displayed.     Hematology Recent Labs  Lab 10/02/19 2357 10/02/19 2357 10/03/19 0500 10/03/19 1202 10/04/19 0555  WBC 7.4  --   --  8.7 7.7  RBC 3.40*  --   --  3.85* 3.61*  HGB 8.7*   < > 8.6* 10.2* 9.4*  HCT 29.1*   < > 27.3* 31.7* 30.3*  MCV 85.6  --   --  82.3 83.9  MCH 25.6*  --   --  26.5 26.0  MCHC 29.9*  --   --  32.2 31.0  RDW 15.1  --   --  15.6* 15.8*  PLT 505*  --   --  455* 458*   < > = values in this interval not displayed.    Cardiac EnzymesNo results for input(s): TROPONINI in the last 168 hours. No results for input(s): TROPIPOC in the last 168 hours.   BNP Recent Labs  Lab 10/02/19 1348  BNP 391.0*     DDimer No results for input(s): DDIMER in the last 168 hours.   Radiology    CT ANGIO CHEST PE W OR WO CONTRAST  Result Date: 10/03/2019 IMPRESSION: No evidence of pulmonary embolism. Diffuse interseptal thickening, scattered ground-glass infiltrates, and BILATERAL pleural effusions, favor pulmonary edema. Compressive atelectasis at lung bases. Enlargement of  cardiac chambers. 6 mm RIGHT lower lobe pulmonary nodule unchanged; since this is stable at 6 months, follow-up CT in 18 months (from today's scan) is considered optional for low-risk patients, but is recommended for high-risk patients. This recommendation follows the consensus statement: Guidelines for Management of Incidental Pulmonary Nodules Detected on CT Images: From the Fleischner Society 2017; Radiology 2017; 284:228-243. Electronically Signed   By: 11-20-1988 M.D.   On: 10/03/2019 12:11   12/03/2019 Venous Img Lower Bilateral (DVT)  Result Date: 10/03/2019 IMPRESSION: No evidence of DVT within either lower extremity. Electronically Signed   By: 12/03/2019 M.D.   On: 10/03/2019 13:09   DG Chest Portable 1 View  Result Date: 10/02/2019 IMPRESSION: Patchy bilateral opacities could reflect edema or infection. Electronically Signed   By: 12/02/2019 M.D.   On: 10/02/2019 19:46   Cardiac Studies   2D echo 10/03/2019: 1. Left ventricular ejection fraction, by estimation, is 35 to 40%. The  left ventricle has moderately decreased function. The left ventricle  demonstrates global hypokinesis. Left ventricular diastolic parameters are  consistent with Grade II diastolic  dysfunction (pseudonormalization).  2. Right ventricular systolic function is normal. The right ventricular  size is normal.  3. The mitral valve is normal in structure. No evidence of mitral valve  regurgitation. No evidence of mitral stenosis.  4. The aortic valve is normal in structure. Aortic valve regurgitation is  not visualized. No aortic stenosis is present.  5. The inferior vena cava is normal in size with greater than 50%  respiratory variability, suggesting right atrial pressure of 3 mmHg.  Patient Profile     34 y.o. female with history of DM, polysubstance abuse, and GERD who is being seen today for the evaluation of acute HFrEF.  Assessment & Plan    1. Acute HFrEF: -Etiology uncertain at this time -She  remains volume up on exam, though improving -Continue IV Lasix 60 mg bid until there is evidence of intravascular volume depletion, such as a bump in her renal function  -She will  need an ischemic evaluation prior to discharge, will discuss with MD regarding Lexiscan MPI vs R/LHC -Given her underlying comorbid conditions of uncertain etiology, including anemia, Myoview may be the better option  -Continue GDMT including lisinopril and Toprol XL -Prior to discharge, look to add spironolactone after she has been adequately diuresed  -As an outpatient, look to transition from ACEi to [facility] -Repeat echo in several months time following medication optimization and ischemic evaluation  -CHF education -Strict I/O -Daily weights   2. Elevated HS-Tn: -Minimally elevated and not consistent with ACS -Supply demand ischemia in the setting of anemia -CTA chest negative for PE -No role for heparin gtt at this time without dynamic elevation and in the setting of anemia  -Echo as above  3. Anemia: -Improved to 10.2 following 2 units of pRBC and trending to 9.4 this morning -Contributing to her overall presentation -Management per IM  4. Hypoalbuminemia: -Likely contributing to third spacing playing a role in her lower extremity swelling -Management per primary service   5. Uncontrolled diabetes: -Per IM  6. Sinus tachycardia: -Compensatory in the setting of her dyspnea and anemia -CTA chest negative for PE  7. Substance use: -She denies illegal drugs at time of cardiology consult, though cocaine is listed in her chart -UDS positive for cocaine this admission -Complete cessation of substances is recommended   8. Hypokalemia/hypomagnesemia: -Replete potassium and magnesium to goal 4.0 and 2.0, respectively   9. Pulmonary nodule: -Outpatient follow up with her PCP  10. HTN: -BP improving -Continue medical therapy as above   For questions or updates, please contact [facility] Please consult [email or web address] for contact info under Cardiology/STEMI.    Signed, Eula Listen, PA-C [facility] Pager: [phone] 10/04/2019, 8:29 AM

## 2019-10-04 NOTE — Progress Notes (Signed)
Inpatient Diabetes Program Recommendations  AACE/ADA: New Consensus Statement on Inpatient Glycemic Control (2015)  Target Ranges:  Prepandial:   less than 140 mg/dL      Peak postprandial:   less than 180 mg/dL (1-2 hours)      Critically ill patients:  140 - 180 mg/dL   Lab Results  Component Value Date   GLUCAP 166 (H) 10/04/2019   HGBA1C 12.1 (H) 10/02/2019    Review of Glycemic Control  Diabetes history: DM2 Outpatient Diabetes medications: Metformin 500 mg bid Current orders for Inpatient glycemic control: Lantus 21 units qd + Novolog 5 units tid meal coverage + Novolog moderate correction tid + hs 0-5 units  Inpatient Diabetes Program Recommendations:   CBGs well controlled on changes in insulin doses. -Decrease Novolog correction to sensitive tid + hs 0-5 units  If convert to Relion 70/30 on discharge, 14 units bid = 19.6 units basal + 8.4 meal coverage  Thank you, Darel Hong E. [place] Mullett, RN, MSN, CDE  Diabetes Coordinator Inpatient Glycemic Control Team Team Pager [phone] (8am-5pm) 10/04/2019 11:17 AM

## 2019-10-04 NOTE — Progress Notes (Signed)
PROGRESS NOTE    Molly Howard  ZOX:[id number] DOB: [birth year] DOA: 10/02/2019 PCP: Patient, No Pcp Per   Brief Narrative:  HPI per Dr. Tonye Royalty on 10/02/19 Molly Howard is a 34 y.o. female with a known history of DM, GERD presents to the emergency department for evaluation of leg swelling.  Patient was in a usual state of health until 1 week ago when she developed bilateral leg swelling which has been been progressively worsening as well as intermittent shortness of breath and PND.  Dyspnea on exertion has also become worsening over the past 24 hours. On further questioning she reports awakening at night on occasion with palpitations.  Denies any known cardiac history.  Denies drug or alcohol uise  Patient denies fevers/chills, weakness, dizziness, chest pain,  N/V/C/D, abdominal pain, dysuria/frequency, changes in mental status, heavy vaginal bleeding.   Otherwise there has been no change in status. Patient has been taking medication as prescribed and there has been no recent change in medication or diet.  No recent antibiotics.  There has been no recent illness, hospitalizations, travel or sick contacts.    EMS/ED Course: Patient received Lasix. Medical admission has been requested for further management of new onset of congestive heart failure, symptomatic anemia-normocytic without active bleeding, hyperglycemia without DKA.  **Interim History  She was typed and screened and transfused 2 units of PRBCs.  Cardiology was consulted for further evaluation recommendations.  CTA did not show any evidence of a PE but did show scattered groundglass infiltrates and bilateral pleural effusions fairing pulmonary edema.  Echo was done showed systolic dysfunction with an EF of 35 to 40% along with diastolic dysfunction grade 2.  Cardiology recommending continuing diuresis and she will be undergoing a Lexiscan in the a.m. she did test positive for cocaine so cardiology is changing some of her  medications.  Assessment & Plan:   Active Problems:   Symptomatic anemia   HFrEF (heart failure with reduced ejection fraction) (HCC)   Swelling  Symptomatic Anemia, normocytic -Admit inpatient telemetry -She presented with a Hgb/Hct of 8.8/28.2 and repeat was 8.6/27.3; She was typed and screened and transfused 2 units of pRBC's -After Transfusion of 2 units packed red blood cells her Hgb/Hct yesterday AM was 10.2/31.7 and today slightly dropped to 9.4/30.3 -Anemia panel was checked and showed an iron level of 21, U IBC 307, TIBC 328, saturation ratios of 6%, ferritin level 3, folate level 8.6, and vitamin B12 level of 324 -FOBT still pending -Continue to monitor for signs and symptoms of bleeding; currently no overt bleeding noted -Can likely have her anemia worked up in outpatient setting -Will likely need Heme follow up as outpatient.  Also needs to update GYN healthcare maintenance.  Elevated Troponin Volume Overload and Dyspnea in the setting of Acute systolic and grade 2 diastolic congestive heart failure with an EF of 35 to 40%, poA -Continue to Monitor on Telemetry -BNP was 391.0 on Admission and Troponin went from 19 -> 21 -> 21 -Continue to Monitor Stric Intake and output, and Daily Weights -C/w Supplemental O2 via [place] and Wean patient to Room Air -Continuous Pulse Oximetry and Maintain O2 Sats >92% -SpO2: 93 % -C/w nebs as needed -Started on Daily Lasix and given IV 60 mg yesterday and received IV 20 mg yesterday AM but was changed to IV 60 every 12 today and likely be changed to p.o. in the a.m. -C/w Aspirin 81 mg po Daily, Beta-Blocker with Metoprolol Succinate 12.5 mg po Daily changed  to carvedilol 3.125 mg p.o. twice daily by cardiology and ACE inhibitor with Lisinopril 2.5 mg Daily was increased to 5 mg p.o. daily -TSH was 3.286 -Checked Fibrin Derivatives D Dimer was 1574.20 to rule out PE and will get CTA Chest and LE Venous Duplex -CTA Chest showed "No evidence of  pulmonary embolism. Diffuse interseptal thickening, scattered ground-glass infiltrates, and BILATERAL pleural effusions, favor pulmonary edema. Compressive atelectasis at lung bases. Enlargement of cardiac chambers. 6 mm RIGHT lower lobe pulmonary nodule unchanged;" -Check LE Venous Duplex and showed "No evidence of DVT within either lower extremity." -Check Cardiac Transthoracic ECHOCardiogram and showed an EF of 35 to 40% with grade 2 diastolic dysfunction -Strict I's and O's and daily weights; patient is -1.708 L since admission and weight is questionable and unclear if this is accurate and has been to 35 for last 3 days -Cardiology consulted for further evaluation and recommendations; Appreciate their assistance  -Will need an Ambulatory Home O2 screen prior to D/C but she is going for a Lexiscan Myoview in the AM  -Cardiology recommends IV changing to p.o. diuretic by tomorrow and they recommending considering switching lisinopril to [facility] as an outpatient and adding small dose of spironolactone  Uncontrolled Diabetes Mellitus Type 2 with Hyperglycemia without DKA.  -C/w Heart healthy, carb controlled diet -Checked A1c and was 12.1 -CBGs ranging from 98-166 now -Started the patient on insulin glargine 21 units subcu daily along with moderate NovoLog/scale insulin before meals and at bedtime but will decrease down to sensitive NovoLog sliding scale insulin before meals and at bedtime -We will have the patient on 5 units subcu 3 times daily with meals  -Diabetes education coordinator consulted for further evaluation recommendations appreciate their assistance and if patient will be discharged she can be discharged on 70/30 14 units twice daily -Continue to monitor blood sugars carefully and adjust insulin regimen as necessary  Hypertension -Medical therapy per cardiology and they have changed her metoprolol to carvedilol -Continue with lisinopril 5 mg p.o. daily -Currently getting  diuresed -Blood pressure is 129/81  Cocaine Abuse -UDS was positive for cocaine -Continue cocaine counseling and cessation  Right lower lobe pulmonary nodule -Incidental finding -Was 6 mm and stable so recommending a repeat scan in 18 months   History of GERD -PPI as needed  Thrombocytosis -Patient's Platelet Count was 527 -> 505 -> 455 -> 458 -Continue to Monitor carefully -Repeat CBC in the AM  Obesity -Estimated body mass index is 36.84 kg/m as calculated from the following:   Height as of this encounter:  (1.702 m).   Weight as of this encounter: 106.7 kg. -Weight Loss and Dietary Counseling given   Hypomagnesemia -Patient's magnesium level is 1.6 -Replete with IV mag sulfate 2 g -Continue to monitor and replete as necessary -Repeat magnesium level in the a.m.  Hypokalemia -Patient's potassium this morning was 3.2 -Replete with p.o. potassium chloride 40 mg 3 times daily x3 doses -Continue to monitor and replete as necessary -Repeat CMP in a.m.   DVT prophylaxis: SCDs; pharmacological prophylaxis held given her acute anemia Code Status: FULL CODE  Family Communication: No family present at bedside Disposition Plan: Remain inpatient for further evaluation and recommendations by cardiology as well as continue diuresis  Status is: Inpatient  Remains inpatient appropriate because:Unsafe d/c plan, IV treatments appropriate due to intensity of illness or inability to take PO and Inpatient level of care appropriate due to severity of illness   Dispo: The patient is from: Home  Anticipated d/c is to: TBD              Anticipated d/c date is: 2 days              Patient currently is not medically stable to d/c.  Consultants:    Cardiology   Procedures:  ECHOCardiogram IMPRESSIONS    1. Left ventricular ejection fraction, by estimation, is 35 to 40%. The  left ventricle has moderately decreased function. The left ventricle  demonstrates  global hypokinesis. Left ventricular diastolic parameters are  consistent with Grade II diastolic  dysfunction (pseudonormalization).  2. Right ventricular systolic function is normal. The right ventricular  size is normal.  3. The mitral valve is normal in structure. No evidence of mitral valve  regurgitation. No evidence of mitral stenosis.  4. The aortic valve is normal in structure. Aortic valve regurgitation is  not visualized. No aortic stenosis is present.  5. The inferior vena cava is normal in size with greater than 50%  respiratory variability, suggesting right atrial pressure of 3 mmHg.   FINDINGS  Left Ventricle: Left ventricular ejection fraction, by estimation, is 35  to 40%. The left ventricle has moderately decreased function. The left  ventricle demonstrates global hypokinesis. The left ventricular internal  cavity size was normal in size.  There is no left ventricular hypertrophy. Left ventricular diastolic  parameters are consistent with Grade II diastolic dysfunction  (pseudonormalization).   Right Ventricle: The right ventricular size is normal. No increase in  right ventricular wall thickness. Right ventricular systolic function is  normal.   Left Atrium: Left atrial size was normal in size.   Right Atrium: Right atrial size was normal in size.   Pericardium: There is no evidence of pericardial effusion.   Mitral Valve: The mitral valve is normal in structure. Normal mobility of  the mitral valve leaflets. No evidence of mitral valve regurgitation. No  evidence of mitral valve stenosis. MV peak gradient, 6.2 mmHg. The mean  mitral valve gradient is 2.0 mmHg.   Tricuspid Valve: The tricuspid valve is normal in structure. Tricuspid  valve regurgitation is not demonstrated. No evidence of tricuspid  stenosis.   Aortic Valve: The aortic valve is normal in structure. Aortic valve  regurgitation is not visualized. No aortic stenosis is present. Aortic   valve mean gradient measures 5.0 mmHg. Aortic valve peak gradient measures  8.3 mmHg. Aortic valve area, by VTI  measures 2.32 cm.   Pulmonic Valve: The pulmonic valve was normal in structure. Pulmonic valve  regurgitation is not visualized. No evidence of pulmonic stenosis.   Aorta: The aortic root is normal in size and structure.   Venous: The inferior vena cava is normal in size with greater than 50%  respiratory variability, suggesting right atrial pressure of 3 mmHg.   IAS/Shunts: No atrial level shunt detected by color flow Doppler.     LEFT VENTRICLE  PLAX 2D  LVIDd:     5.39 cm   Diastology  LVIDs:     4.40 cm   LV e' lateral:  7.51 cm/s  LV PW:     0.97 cm   LV E/e' lateral: 15.1  LV IVS:    0.76 cm   LV e' medial:  6.20 cm/s  LVOT diam:   2.00 cm   LV E/e' medial: 18.3  LV SV:     58  LV SV Index:  27  LVOT Area:   3.14 cm    LV Volumes (MOD)  LV vol d, MOD A2C: 139.0 ml  LV vol d, MOD A4C: 119.0 ml  LV vol s, MOD A2C: 81.2 ml  LV vol s, MOD A4C: 75.2 ml  LV SV MOD A2C:   57.8 ml  LV SV MOD A4C:   119.0 ml  LV SV MOD BP:   53.9 ml   RIGHT VENTRICLE  RV Basal diam: 3.04 cm   LEFT ATRIUM       Index    RIGHT ATRIUM      Index  LA diam:    3.90 cm 1.80 cm/m RA Area:   10.60 cm  LA Vol (A2C):  34.8 ml 16.05 ml/m RA Volume:  24.70 ml 11.39 ml/m  LA Vol (A4C):  32.6 ml 15.03 ml/m  LA Biplane Vol: 34.6 ml 15.95 ml/m  AORTIC VALVE          PULMONIC VALVE  AV Area (Vmax):  2.25 cm   PV Vmax:    0.80 m/s  AV Area (Vmean):  2.25 cm   PV Vmean:   57.700 cm/s  AV Area (VTI):   2.32 cm   PV VTI:    0.143 m  AV Vmax:      144.00 cm/s PV Peak grad: 2.6 mmHg  AV Vmean:     105.000 cm/s PV Mean grad: 2.0 mmHg  AV VTI:      0.250 m  AV Peak Grad:   8.3 mmHg  AV Mean Grad:   5.0 mmHg  LVOT Vmax:     103.00 cm/s  LVOT Vmean:     75.300 cm/s  LVOT VTI:     0.185 m  LVOT/AV VTI ratio: 0.74    AORTA  Ao Root diam: 2.60 cm   MITRAL VALVE  MV Area (PHT): 3.09 cm   SHUNTS  MV Peak grad: 6.2 mmHg   Systemic VTI: 0.18 m  MV Mean grad: 2.0 mmHg   Systemic Diam: 2.00 cm  MV Vmax:    1.24 m/s  MV Vmean:   71.6 cm/s  MV Decel Time: 245 msec  MV E velocity: 113.33 cm/s   LE Venous Duplex; Showed no DVT Bilaterally   Antimicrobials:  Anti-infectives (From admission, onward)   None     Subjective: Seen and examined at bedside states that her shortness of breath is better but states that her legs are also improving but still has some swelling in her ankles and feet.  No nausea or vomiting.  Understands that she is going to go for a Lexiscan in the morning.  No other concerns or complaints at this time.  Objective: Vitals:   10/04/19 0435 10/04/19 0806 10/04/19 1144 10/04/19 1502  BP: (!) 131/94 (!) 129/93 122/85 129/81  Pulse: 89 99 87 87  Resp: 20 17 17 18   Temp: 98.5 F (36.9 C) 98.5 F (36.9 C) 98.1 F (36.7 C) 98.7 F (37.1 C)  TempSrc: Oral Oral Oral Oral  SpO2: 97% 96% 100% 93%  Weight: 106.7 kg     Height:        Intake/Output Summary (Last 24 hours) at 10/04/2019 1504 Last data filed at 10/04/2019 1345 Gross per 24 hour  Intake 600 ml  Output 2400 ml  Net -1800 ml   Filed Weights   10/02/19 2259 10/03/19 0340 10/04/19 0435  Weight: 106.6 kg 107 kg 106.7 kg   Examination: Physical Exam:  Constitutional: WN/WD African-American female currently no acute distress appears calm Eyes: Lids and conjunctivae normal, sclerae anicteric  ENMT:  External Ears, Nose appear normal. Grossly normal hearing. Mucous membranes are moist.  Neck: Appears normal, supple, no cervical masses, normal ROM, no appreciable thyromegaly; no JVD Respiratory: Diminished to auscultation bilaterally, no wheezing, rales, rhonchi or crackles. Normal respiratory effort and patient is not tachypenic.  No accessory muscle use.  Unlabored breathing Cardiovascular: RRR, no murmurs / rubs / gallops. S1 and S2 auscultated.  Has some pedal edema Abdomen: Soft, non-tender, distended secondary body habitus. Bowel sounds positive x4.  GU: Deferred. Musculoskeletal: No clubbing / cyanosis of digits/nails. No joint deformity upper and lower extremities.  Skin: No rashes, lesions, ulcers on limited skin evaluation. No induration; Warm and dry.  Neurologic: CN 2-12 grossly intact with no focal deficits. Romberg sign and cerebellar reflexes not assessed.  Psychiatric: Normal judgment and insight. Alert and oriented x 3. Normal mood and appropriate affect.   Data Reviewed: I have personally reviewed following labs and imaging studies  CBC: Recent Labs  Lab 10/02/19 1348 10/02/19 2357 10/03/19 0500 10/03/19 1202 10/04/19 0555  WBC 8.0 7.4  --  8.7 7.7  NEUTROABS  --   --   --  6.9 5.0  HGB 8.8* 8.7* 8.6* 10.2* 9.4*  HCT 28.2* 29.1* 27.3* 31.7* 30.3*  MCV 82.9 85.6  --  82.3 83.9  PLT 527* 505*  --  455* 458*   Basic Metabolic Panel: Recent Labs  Lab 10/02/19 1348 10/02/19 2124 10/02/19 2357 10/03/19 1202 10/04/19 0555  NA 135  --  137 135 136  K 3.6  --  3.8 3.5 3.2*  CL 100  --  100 99 100  CO2 27  --  29 27 29   GLUCOSE 259*  --  385* 232* 141*  BUN 9  --  10 10 11   CREATININE 0.82  --  0.97 0.76 0.80  CALCIUM 7.9*  --  7.8* 8.0* 7.9*  MG  --  1.8  --  1.7 1.6*  PHOS  --  3.6  --  3.3 4.6   GFR: Estimated Creatinine Clearance: 123.3 mL/min (by C-G formula based on SCr of 0.8 mg/dL). Liver Function Tests: Recent Labs  Lab 10/02/19 1348 10/03/19 1202 10/04/19 0555  AST 14* 21 14*  ALT 12 11 11   ALKPHOS 84 78 67  BILITOT 0.7 1.2 0.5  PROT 6.3* 6.0* 5.5*  ALBUMIN 2.2* 2.3* 2.0*   No results for input(s): LIPASE, AMYLASE in the last 168 hours. No results for input(s): AMMONIA in the last 168 hours. Coagulation Profile: Recent Labs  Lab 10/02/19 2357  INR 1.0    Cardiac Enzymes: No results for input(s): CKTOTAL, CKMB, CKMBINDEX, TROPONINI in the last 168 hours. BNP (last 3 results) No results for input(s): PROBNP in the last 8760 hours. HbA1C: Recent Labs    10/02/19 2124  HGBA1C 12.1*   CBG: Recent Labs  Lab 10/03/19 1219 10/03/19 1636 10/03/19 2058 10/04/19 0807 10/04/19 1143  GLUCAP 241* 127* 117* 166* 98   Lipid Profile: No results for input(s): CHOL, HDL, LDLCALC, TRIG, CHOLHDL, LDLDIRECT in the last 72 hours. Thyroid Function Tests: Recent Labs    10/02/19 1348  TSH 3.286   Anemia Panel: Recent Labs    10/02/19 2124 10/02/19 2357  VITAMINB12 324  --   FOLATE 8.6  --   FERRITIN 3*  --   TIBC  --  328  IRON  --  21*   Sepsis Labs: No results for input(s): PROCALCITON, LATICACIDVEN in the last 168 hours.  Recent Results (from the past  240 hour(s))  SARS Coronavirus 2 by RT PCR (hospital order, performed in [facility] hospital lab) Nasopharyngeal Nasopharyngeal Swab     Status: None   Collection Time: 10/02/19  7:20 PM   Specimen: Nasopharyngeal Swab  Result Value Ref Range Status   SARS Coronavirus 2 NEGATIVE NEGATIVE Final    Comment: (NOTE) SARS-CoV-2 target nucleic acids are NOT DETECTED. The SARS-CoV-2 RNA is generally detectable in upper and lower respiratory specimens during the acute phase of infection. The lowest concentration of SARS-CoV-2 viral copies this assay can detect is 250 copies / mL. A negative result does not preclude SARS-CoV-2 infection and should not be used as the sole basis for treatment or other patient management decisions.  A negative result may occur with improper specimen collection / handling, submission of specimen other than nasopharyngeal swab, presence of viral mutation(s) within the areas targeted by this assay, and inadequate number of viral copies (<250 copies / mL). A negative result must be combined with clinical observations, patient history, and epidemiological  information. Fact Sheet for Patients:   BoilerBrush.com.cy Fact Sheet for Healthcare Providers: https://pope.com/ This test is not yet approved or cleared  by the [place] FDA and has been authorized for detection and/or diagnosis of SARS-CoV-2 by FDA under an Emergency Use Authorization (EUA).  This EUA will remain in effect (meaning this test can be used) for the duration of the COVID-19 declaration under Section 564(b)(1) of the Act, 21 U.S.C. section 360bbb-3(b)(1), unless the authorization is terminated or revoked sooner. Performed at [facility], [address] [place], [place] [address]      RN Pressure Injury Documentation:     Estimated body mass index is 36.84 kg/m as calculated from the following:   Height as of this encounter: 5\' 7"  (1.702 m).   Weight as of this encounter: 106.7 kg.  Malnutrition Type:      Malnutrition Characteristics:      Nutrition Interventions:    Radiology Studies: CT ANGIO CHEST PE W OR WO CONTRAST  Result Date: 10/03/2019 CLINICAL DATA:  Lower extremity swelling for 1 week, shortness of breath with minimal exertion beginning 24 hours ago, smoker EXAM: CT ANGIOGRAPHY CHEST WITH CONTRAST TECHNIQUE: Multidetector CT imaging of the chest was performed using the standard protocol during bolus administration of intravenous contrast. Multiplanar CT image reconstructions and MIPs were obtained to evaluate the vascular anatomy. CONTRAST:  31mL OMNIPAQUE IOHEXOL 350 MG/ML SOLN IV COMPARISON:  04/10/2019 FINDINGS: Cardiovascular: Enlargement of cardiac chambers. No pericardial effusion. Aorta normal caliber without aneurysm or dissection. Pulmonary arteries adequately opacified and patent. No evidence of pulmonary embolism. Slightly distended IVC. Mediastinum/Nodes: Esophagus unremarkable. Base of cervical region normal appearance. No thoracic adenopathy. Lungs/Pleura: BILATERAL pleural  effusions and compressive atelectasis of the posterior lungs. Interseptal thickening throughout both lungs with patchy areas of ground-glass infiltrates favor pulmonary edema. No segmental consolidation or pneumothorax. 6 mm RIGHT lower lobe nodule unchanged. Upper Abdomen: Unremarkable Musculoskeletal: Normal appearance Review of the MIP images confirms the above findings. IMPRESSION: No evidence of pulmonary embolism. Diffuse interseptal thickening, scattered ground-glass infiltrates, and BILATERAL pleural effusions, favor pulmonary edema. Compressive atelectasis at lung bases. Enlargement of cardiac chambers. 6 mm RIGHT lower lobe pulmonary nodule unchanged; since this is stable at 6 months, follow-up CT in 18 months (from today's scan) is considered optional for low-risk patients, but is recommended for high-risk patients. This recommendation follows the consensus statement: Guidelines for Management of Incidental Pulmonary Nodules Detected on CT Images: From the Fleischner  Society 2017; Radiology 2017; [phone]. Electronically Signed   By: Ulyses Southward M.D.   On: 10/03/2019 12:11   US Venous Img Lower Bilateral (DVT)  Result Date: 10/03/2019 CLINICAL DATA:  Bilateral lower extremity edema. History of smoking. Evaluate for DVT. EXAM: BILATERAL LOWER EXTREMITY VENOUS DOPPLER ULTRASOUND TECHNIQUE: Gray-scale sonography with graded compression, as well as color Doppler and duplex ultrasound were performed to evaluate the lower extremity deep venous systems from the level of the common femoral vein and including the common femoral, femoral, profunda femoral, popliteal and calf veins including the posterior tibial, peroneal and gastrocnemius veins when visible. The superficial great saphenous vein was also interrogated. Spectral Doppler was utilized to evaluate flow at rest and with distal augmentation maneuvers in the common femoral, femoral and popliteal veins. COMPARISON:  None. FINDINGS: RIGHT LOWER  EXTREMITY Common Femoral Vein: No evidence of thrombus. Normal compressibility, respiratory phasicity and response to augmentation. Saphenofemoral Junction: No evidence of thrombus. Normal compressibility and flow on color Doppler imaging. Profunda Femoral Vein: No evidence of thrombus. Normal compressibility and flow on color Doppler imaging. Femoral Vein: No evidence of thrombus. Normal compressibility, respiratory phasicity and response to augmentation. Popliteal Vein: No evidence of thrombus. Normal compressibility, respiratory phasicity and response to augmentation. Calf Veins: No evidence of thrombus. Normal compressibility and flow on color Doppler imaging. Superficial Great Saphenous Vein: No evidence of thrombus. Normal compressibility. Venous Reflux:  None. Other Findings:  None. LEFT LOWER EXTREMITY Common Femoral Vein: No evidence of thrombus. Normal compressibility, respiratory phasicity and response to augmentation. Saphenofemoral Junction: No evidence of thrombus. Normal compressibility and flow on color Doppler imaging. Profunda Femoral Vein: No evidence of thrombus. Normal compressibility and flow on color Doppler imaging. Femoral Vein: No evidence of thrombus. Normal compressibility, respiratory phasicity and response to augmentation. Popliteal Vein: No evidence of thrombus. Normal compressibility, respiratory phasicity and response to augmentation. Calf Veins: No evidence of thrombus. Normal compressibility and flow on color Doppler imaging. Superficial Great Saphenous Vein: No evidence of thrombus. Normal compressibility. Venous Reflux:  None. Other Findings:  None. IMPRESSION: No evidence of DVT within either lower extremity. Electronically Signed   By: Simonne Come M.D.   On: 10/03/2019 13:09   DG Chest Portable 1 View  Result Date: 10/02/2019 CLINICAL DATA:  Bilateral leg swelling, shortness of breath EXAM: PORTABLE CHEST 1 VIEW COMPARISON:  04/10/2019 FINDINGS: Heart is borderline in size.  Patchy bilateral opacities. No effusions. No acute bony abnormality. IMPRESSION: Patchy bilateral opacities could reflect edema or infection. Electronically Signed   By: Charlett Nose M.D.   On: 10/02/2019 19:46   ECHOCARDIOGRAM COMPLETE  Result Date: 10/03/2019    ECHOCARDIOGRAM REPORT   Patient Name:   ELLEY HARP Date of Exam: 10/03/2019 Medical Rec #:  [id number]     Height:       67.0 in Accession #:    [id number]    Weight:       235.8 lb Date of Birth:  1986/03/24     BSA:          2.169 m Patient Age:    34 years      BP:           149/100 mmHg Patient Gender: F             HR:           101 bpm. Exam Location:  [facility] Procedure: 2D Echo, Color Doppler and Cardiac Doppler Indications:     I50.9 Congestive Heart  Failure  History:         Patient has no prior history of Echocardiogram examinations.                  Signs/Symptoms:Shortness of Breath and Leg edema; Risk                  Factors:Diabetes.  Sonographer:     Humphrey Rolls RDCS (AE) Referring Phys:  [id number] Tonye Royalty Diagnosing Phys: Debbe Odea MD  Sonographer Comments: No subcostal window. IMPRESSIONS  1. Left ventricular ejection fraction, by estimation, is 35 to 40%. The left ventricle has moderately decreased function. The left ventricle demonstrates global hypokinesis. Left ventricular diastolic parameters are consistent with Grade II diastolic dysfunction (pseudonormalization).  2. Right ventricular systolic function is normal. The right ventricular size is normal.  3. The mitral valve is normal in structure. No evidence of mitral valve regurgitation. No evidence of mitral stenosis.  4. The aortic valve is normal in structure. Aortic valve regurgitation is not visualized. No aortic stenosis is present.  5. The inferior vena cava is normal in size with greater than 50% respiratory variability, suggesting right atrial pressure of 3 mmHg. FINDINGS  Left Ventricle: Left ventricular ejection fraction, by estimation, is 35 to 40%. The  left ventricle has moderately decreased function. The left ventricle demonstrates global hypokinesis. The left ventricular internal cavity size was normal in size. There is no left ventricular hypertrophy. Left ventricular diastolic parameters are consistent with Grade II diastolic dysfunction (pseudonormalization). Right Ventricle: The right ventricular size is normal. No increase in right ventricular wall thickness. Right ventricular systolic function is normal. Left Atrium: Left atrial size was normal in size. Right Atrium: Right atrial size was normal in size. Pericardium: There is no evidence of pericardial effusion. Mitral Valve: The mitral valve is normal in structure. Normal mobility of the mitral valve leaflets. No evidence of mitral valve regurgitation. No evidence of mitral valve stenosis. MV peak gradient, 6.2 mmHg. The mean mitral valve gradient is 2.0 mmHg. Tricuspid Valve: The tricuspid valve is normal in structure. Tricuspid valve regurgitation is not demonstrated. No evidence of tricuspid stenosis. Aortic Valve: The aortic valve is normal in structure. Aortic valve regurgitation is not visualized. No aortic stenosis is present. Aortic valve mean gradient measures 5.0 mmHg. Aortic valve peak gradient measures 8.3 mmHg. Aortic valve area, by VTI measures 2.32 cm. Pulmonic Valve: The pulmonic valve was normal in structure. Pulmonic valve regurgitation is not visualized. No evidence of pulmonic stenosis. Aorta: The aortic root is normal in size and structure. Venous: The inferior vena cava is normal in size with greater than 50% respiratory variability, suggesting right atrial pressure of 3 mmHg. IAS/Shunts: No atrial level shunt detected by color flow Doppler.  LEFT VENTRICLE PLAX 2D LVIDd:         5.39 cm      Diastology LVIDs:         4.40 cm      LV e' lateral:   7.51 cm/s LV PW:         0.97 cm      LV E/e' lateral: 15.1 LV IVS:        0.76 cm      LV e' medial:    6.20 cm/s LVOT diam:     2.00 cm       LV E/e' medial:  18.3 LV SV:         58 LV SV Index:   27 LVOT Area:  3.14 cm  LV Volumes (MOD) LV vol d, MOD A2C: 139.0 ml LV vol d, MOD A4C: 119.0 ml LV vol s, MOD A2C: 81.2 ml LV vol s, MOD A4C: 75.2 ml LV SV MOD A2C:     57.8 ml LV SV MOD A4C:     119.0 ml LV SV MOD BP:      53.9 ml RIGHT VENTRICLE RV Basal diam:  3.04 cm LEFT ATRIUM             Index       RIGHT ATRIUM           Index LA diam:        3.90 cm 1.80 cm/m  RA Area:     10.60 cm LA Vol (A2C):   34.8 ml 16.05 ml/m RA Volume:   24.70 ml  11.39 ml/m LA Vol (A4C):   32.6 ml 15.03 ml/m LA Biplane Vol: 34.6 ml 15.95 ml/m  AORTIC VALVE                    PULMONIC VALVE AV Area (Vmax):    2.25 cm     PV Vmax:       0.80 m/s AV Area (Vmean):   2.25 cm     PV Vmean:      57.700 cm/s AV Area (VTI):     2.32 cm     PV VTI:        0.143 m AV Vmax:           144.00 cm/s  PV Peak grad:  2.6 mmHg AV Vmean:          105.000 cm/s PV Mean grad:  2.0 mmHg AV VTI:            0.250 m AV Peak Grad:      8.3 mmHg AV Mean Grad:      5.0 mmHg LVOT Vmax:         103.00 cm/s LVOT Vmean:        75.300 cm/s LVOT VTI:          0.185 m LVOT/AV VTI ratio: 0.74  AORTA Ao Root diam: 2.60 cm MITRAL VALVE MV Area (PHT): 3.09 cm     SHUNTS MV Peak grad:  6.2 mmHg     Systemic VTI:  0.18 m MV Mean grad:  2.0 mmHg     Systemic Diam: 2.00 cm MV Vmax:       1.24 m/s MV Vmean:      71.6 cm/s MV Decel Time: 245 msec MV E velocity: 113.33 cm/s [name] MD Electronically signed by [name] MD Signature Date/Time: 10/03/2019/1:42:17 PM    Final    Scheduled Meds: . aspirin EC  81 mg Oral Daily  . carvedilol  3.125 mg Oral BID WC  . furosemide  60 mg Intravenous BID  . insulin aspart  0-15 Units Subcutaneous TID WC  . insulin aspart  0-5 Units Subcutaneous QHS  . insulin aspart  5 Units Subcutaneous TID WC  . insulin glargine  21 Units Subcutaneous Daily  . lisinopril  5 mg Oral Daily  . melatonin  5 mg Oral QHS  . potassium chloride  40 mEq Oral TID   . sodium chloride flush  3 mL Intravenous Q12H   Continuous Infusions:   LOS: 2 days   Kerney Elbe, DO [facility] PAGER is on AMION  If 7PM-7AM, please contact night-coverage [email or web address]

## 2019-10-04 NOTE — TOC Initial Note (Signed)
Transition of Care [facility]) - Initial/Assessment Note    Patient Details  Name: Molly Howard MRN: [id number] Date of Birth: 04/01/1986  Transition of Care [facility]) CM/SW Contact:    Candie Chroman, LCSW Phone Number: 10/04/2019, 2:42 PM  Clinical Narrative: CSW met with patient. No supports at bedside. CSW introduced role and inquired about patient not having a PCP. Patient confirmed. CSW provided booklet for free/low-cost healthcare in [facility] including intake paperwork for [facility] in case she decides to go with them. Patient confirmed the Diabetes Coordinator made her aware of the price for her Relion 70/30 insulin pens and she says she can afford them. Discussed case with TOC supervisor. May be able to utilize [facility] program to help patient with the first month's cost (around $42). [facility] said they do not carry it, only [facility]. No further concerns. CSW encouraged patient to contact CSW as needed. CSW will continue to follow patient for support and facilitate return home when stable.               Expected Discharge Plan: Home/Self Care Barriers to Discharge: Continued Medical Work up   Patient Goals and CMS Choice     Choice offered to / list presented to : NA  Expected Discharge Plan and Services Expected Discharge Plan: Home/Self Care     Post Acute Care Choice: NA Living arrangements for the past 2 months: Single Family Home                                      Prior Living Arrangements/Services Living arrangements for the past 2 months: Single Family Home   Patient language and need for interpreter reviewed:: Yes Do you feel safe going back to the place where you live?: Yes      Need for Family Participation in Patient Care: No (Comment) Care giver support system in place?: No (comment)   Criminal Activity/Legal Involvement Pertinent to Current Situation/Hospitalization: No - Comment as needed  Activities of Daily  Living Home Assistive Devices/Equipment: None ADL Screening (condition at time of admission) Patient's cognitive ability adequate to safely complete daily activities?: Yes Is the patient deaf or have difficulty hearing?: No Does the patient have difficulty seeing, even when wearing glasses/contacts?: No Does the patient have difficulty concentrating, remembering, or making decisions?: No Patient able to express need for assistance with ADLs?: Yes Does the patient have difficulty dressing or bathing?: No Independently performs ADLs?: Yes (appropriate for developmental age) Does the patient have difficulty walking or climbing stairs?: No Weakness of Legs: None Weakness of Arms/Hands: None  Permission Sought/Granted                  Emotional Assessment Appearance:: Appears stated age Attitude/Demeanor/Rapport: Engaged, Gracious Affect (typically observed): Accepting, Appropriate, Calm, Pleasant Orientation: : Oriented to Self, Oriented to Place, Oriented to  Time, Oriented to Situation Alcohol / Substance Use: Not Applicable Psych Involvement: No (comment)  Admission diagnosis:  Symptomatic anemia [D64.9] Patient Active Problem List   Diagnosis Date Noted  . HFrEF (heart failure with reduced ejection fraction) ([place])   . Swelling   . Symptomatic anemia 10/02/2019   PCP:  Patient, No Pcp Per Pharmacy:   [facility] [place] [place] - [address] [facility] [address] [place] [address] Phone: [phone] Fax: [phone]     Social Determinants of  Health (SDOH) Interventions    Readmission Risk Interventions No flowsheet data found.

## 2019-10-05 ENCOUNTER — Inpatient Hospital Stay (HOSPITAL_COMMUNITY): Payer: Self-pay

## 2019-10-05 ENCOUNTER — Inpatient Hospital Stay: Payer: Self-pay

## 2019-10-05 ENCOUNTER — Telehealth: Payer: Self-pay

## 2019-10-05 DIAGNOSIS — I5020 Unspecified systolic (congestive) heart failure: Secondary | ICD-10-CM

## 2019-10-05 LAB — NM MYOCAR MULTI W/SPECT W/WALL MOTION / EF
LV dias vol: 185 mL (ref 46–106)
LV sys vol: 122 mL
Peak HR: 101 {beats}/min
Percent HR: 54 %
Rest HR: 85 {beats}/min
SDS: 0
SRS: 8
SSS: 2
TID: 1.04

## 2019-10-05 LAB — CBC WITH DIFFERENTIAL/PLATELET
Abs Immature Granulocytes: 0.03 10*3/uL (ref 0.00–0.07)
Basophils Absolute: 0.1 10*3/uL (ref 0.0–0.1)
Basophils Relative: 1 %
Eosinophils Absolute: 0.3 10*3/uL (ref 0.0–0.5)
Eosinophils Relative: 3 %
HCT: 31.5 % — ABNORMAL LOW (ref 36.0–46.0)
Hemoglobin: 9.6 g/dL — ABNORMAL LOW (ref 12.0–15.0)
Immature Granulocytes: 0 %
Lymphocytes Relative: 22 %
Lymphs Abs: 1.9 10*3/uL (ref 0.7–4.0)
MCH: 25.8 pg — ABNORMAL LOW (ref 26.0–34.0)
MCHC: 30.5 g/dL (ref 30.0–36.0)
MCV: 84.7 fL (ref 80.0–100.0)
Monocytes Absolute: 0.7 10*3/uL (ref 0.1–1.0)
Monocytes Relative: 8 %
Neutro Abs: 5.7 10*3/uL (ref 1.7–7.7)
Neutrophils Relative %: 66 %
Platelets: 441 10*3/uL — ABNORMAL HIGH (ref 150–400)
RBC: 3.72 MIL/uL — ABNORMAL LOW (ref 3.87–5.11)
RDW: 15.9 % — ABNORMAL HIGH (ref 11.5–15.5)
WBC: 8.7 10*3/uL (ref 4.0–10.5)
nRBC: 0 % (ref 0.0–0.2)

## 2019-10-05 LAB — COMPREHENSIVE METABOLIC PANEL
ALT: 13 U/L (ref 0–44)
AST: 14 U/L — ABNORMAL LOW (ref 15–41)
Albumin: 2.2 g/dL — ABNORMAL LOW (ref 3.5–5.0)
Alkaline Phosphatase: 72 U/L (ref 38–126)
Anion gap: 10 (ref 5–15)
BUN: 14 mg/dL (ref 6–20)
CO2: 28 mmol/L (ref 22–32)
Calcium: 8.3 mg/dL — ABNORMAL LOW (ref 8.9–10.3)
Chloride: 100 mmol/L (ref 98–111)
Creatinine, Ser: 0.72 mg/dL (ref 0.44–1.00)
GFR calc Af Amer: >60 mL/min (ref >60)
GFR calc non Af Amer: >60 mL/min (ref >60)
Glucose, Bld: 138 mg/dL — ABNORMAL HIGH (ref 70–99)
Potassium: 3.9 mmol/L (ref 3.5–5.1)
Sodium: 138 mmol/L (ref 135–145)
Total Bilirubin: 0.4 mg/dL (ref 0.3–1.2)
Total Protein: 5.7 g/dL — ABNORMAL LOW (ref 6.5–8.1)

## 2019-10-05 LAB — GLUCOSE, CAPILLARY
Glucose-Capillary: 175 mg/dL — ABNORMAL HIGH (ref 70–99)
Glucose-Capillary: 188 mg/dL — ABNORMAL HIGH (ref 70–99)
Glucose-Capillary: 115 mg/dL — ABNORMAL HIGH (ref 70–99)

## 2019-10-05 LAB — MAGNESIUM: Magnesium: 1.8 mg/dL (ref 1.7–2.4)

## 2019-10-05 LAB — PHOSPHORUS: Phosphorus: 4.9 mg/dL — ABNORMAL HIGH (ref 2.5–4.6)

## 2019-10-05 MED ORDER — CARVEDILOL 6.25 MG PO TABS
6.2500 mg | ORAL_TABLET | Freq: Two times a day (BID) | ORAL | Status: DC
  Administered 2019-10-05: 6.25 mg via ORAL
  Filled 2019-10-05: qty 1

## 2019-10-05 MED ORDER — BLOOD GLUCOSE MONITOR KIT: PACK | 0 refills | Status: DC

## 2019-10-05 MED ORDER — CARVEDILOL 6.25 MG PO TABS: 6.2500 mg | ORAL_TABLET | Freq: Two times a day (BID) | ORAL | 0 refills | Status: DC

## 2019-10-05 MED ORDER — RELION MINI PEN NEEDLES 31G X 6 MM MISC: 1.0000 | Freq: Two times a day (BID) | 0 refills | Status: DC

## 2019-10-05 MED ORDER — FUROSEMIDE 40 MG PO TABS: 40.0000 mg | ORAL_TABLET | Freq: Every day | ORAL | 0 refills | Status: DC

## 2019-10-05 MED ORDER — LISINOPRIL 5 MG PO TABS: 5.0000 mg | ORAL_TABLET | Freq: Every day | ORAL | 0 refills | Status: DC

## 2019-10-05 MED ORDER — MAGNESIUM SULFATE 2 GM/50ML IV SOLN
2.0000 g | Freq: Once | INTRAVENOUS | Status: AC
  Administered 2019-10-05: 2 g via INTRAVENOUS
  Filled 2019-10-05: qty 50

## 2019-10-05 MED ORDER — NOVOLIN 70/30 FLEXPEN RELION (70-30) 100 UNIT/ML ~~LOC~~ SUPN: 14.0000 [IU] | PEN_INJECTOR | Freq: Two times a day (BID) | SUBCUTANEOUS | 11 refills | Status: DC

## 2019-10-05 MED ORDER — FUROSEMIDE 40 MG PO TABS: 40.0000 mg | ORAL_TABLET | Freq: Every day | ORAL | Status: DC

## 2019-10-05 MED ORDER — TECHNETIUM TC 99M TETROFOSMIN IV KIT
10.0000 | PACK | Freq: Once | INTRAVENOUS | Status: AC | PRN
  Administered 2019-10-05: 10.6 via INTRAVENOUS

## 2019-10-05 MED ORDER — REGADENOSON 0.4 MG/5ML IV SOLN
0.4000 mg | Freq: Once | INTRAVENOUS | Status: AC
  Administered 2019-10-05: 0.4 mg via INTRAVENOUS

## 2019-10-05 MED ORDER — ONDANSETRON HCL 4 MG PO TABS: 4.0000 mg | ORAL_TABLET | Freq: Four times a day (QID) | ORAL | 0 refills | Status: DC | PRN

## 2019-10-05 MED ORDER — ASPIRIN 81 MG PO TBEC: 81.0000 mg | DELAYED_RELEASE_TABLET | Freq: Every day | ORAL | 0 refills | Status: DC

## 2019-10-05 MED ORDER — TECHNETIUM TC 99M TETROFOSMIN IV KIT
30.6100 | PACK | Freq: Once | INTRAVENOUS | Status: AC | PRN
  Administered 2019-10-05: 30.61 via INTRAVENOUS

## 2019-10-05 MED ORDER — POTASSIUM CHLORIDE CRYS ER 20 MEQ PO TBCR
20.0000 meq | EXTENDED_RELEASE_TABLET | Freq: Once | ORAL | Status: AC
  Administered 2019-10-05: 20 meq via ORAL
  Filled 2019-10-05: qty 1

## 2019-10-05 MED ORDER — FUROSEMIDE 40 MG PO TABS
40.0000 mg | ORAL_TABLET | Freq: Two times a day (BID) | ORAL | Status: DC
  Administered 2019-10-05: 40 mg via ORAL
  Filled 2019-10-05: qty 1

## 2019-10-05 MED ORDER — ACETAMINOPHEN 325 MG PO TABS: 650.0000 mg | ORAL_TABLET | Freq: Four times a day (QID) | ORAL | 0 refills | Status: DC | PRN

## 2019-10-05 MED ORDER — SENNOSIDES-DOCUSATE SODIUM 8.6-50 MG PO TABS: 1.0000 | ORAL_TABLET | Freq: Every evening | ORAL | 0 refills | Status: DC | PRN

## 2019-10-05 NOTE — Progress Notes (Signed)
Pt back to unit. Dionne Bucy RN

## 2019-10-05 NOTE — Progress Notes (Signed)
Pt off unit to procedure during beginning of shift. Dionne Bucy RN

## 2019-10-05 NOTE — Telephone Encounter (Signed)
-----   Message from [place] Ouch, MD sent at 10/05/2019  1:58 PM EDT ----- TCM follow in 1 week with Dr. Azucena Cecil or APP in 1 week for CHF. Needs BMP at that time

## 2019-10-05 NOTE — Discharge Summary (Signed)
Physician Discharge Summary  Molly Howard IPJ:[id number] DOB: 10/29/1985 DOA: 10/02/2019  PCP: Patient, No Pcp Per  Admit date: 10/02/2019 Discharge date: 10/05/2019  Admitted From: Home Disposition: Home  Recommendations for Outpatient Follow-up:  1. Follow up with PCP in 1-2 weeks and have further outpatient evaluation of Anemia and follow-up with hematology as well as gynecology 2. Follow-up with lung nodule and repeat CT scan in 18 months 3. Follow up with Cardiology within 1-2 weeks 4. Please obtain CMP/CBC, Mag, Phos in one week 5. Please follow up on the following pending results:  Home Health: No  Equipment/Devices: None  Discharge Condition: Stable CODE STATUS: FULL CODE Diet recommendation: Heart Healthy Carb Modified Diet   Brief/Interim Summary: HPI per Dr. Harvie Bridge on 10/02/19 Molly Howard a 34 y.o.femalewith a known history of DM,GERDpresents to the emergency department for evaluation of leg swelling. Patient was in a usual state of health until 1 week ago when she developed bilateral leg swelling which has been been progressively worsening as well as intermittent shortness of breathand PND.Dyspnea on exertion has also become worsening over the past 24 hours. On further questioning she reports awakening at night on occasion with palpitations. Denies any known cardiac history. Denies drug or alcohol uise  Patient denies fevers/chills, weakness, dizziness, chest pain, N/V/C/D, abdominal pain, dysuria/frequency, changes in mental status, heavy vaginal bleeding.  Otherwise there has been no change in status. Patient has been taking medication as prescribed and there has been no recent change in medication or diet. No recent antibiotics. There has been no recent illness, hospitalizations, travel or sick contacts.   EMS/ED Course:Patient received Lasix. Medical admission has been requested for further management ofnew onset of congestive heart failure,  symptomatic anemia-normocytic without active bleeding,hyperglycemiawithout DKA.  **Interim History  She was typed and screened and transfused 2 units of PRBCs.  Cardiology was consulted for further evaluation recommendations.  CTA did not show any evidence of a PE but did show scattered groundglass infiltrates and bilateral pleural effusions fairing pulmonary edema.  Echo was done showed systolic dysfunction with an EF of 35 to 40% along with diastolic dysfunction grade 2.  Cardiology recommending continuing diuresis with IV Lasix and changed her to po. She underwent a MyoView Lexiscan this AM which showed that the cause of her heart failure was likely due to nonischemic cardiomyopathy based on the results. She did test positive for cocaine so cardiology is changing some of her medications and she was counseled.  Cardiology cleared the patient for discharge and she will need to follow-up with PCP as well as cardiology in the outpatient setting within 1 to 2 weeks and she will be discharged on p.o. Lasix 40 mg p.o. daily increased on her carvedilol to 6.25 mg p.o. twice daily.  Cardiology is arranging follow-up for her for 1 week with plans to initiate treatment with spironolactone switch Isopril to Entresto.  She is stable from a medical standpoint to be discharged home and she will need to follow-up with PCP and prior to discharge diabetes education coordinator had educated her about diabetes and she will be sent on the ReliOn 70 /30 14 units twice daily and she will need further insulin adjustment in outpatient setting   Discharge Diagnoses:  Active Problems:   Symptomatic anemia   HFrEF (heart failure with reduced ejection fraction) (HCC)   Swelling  Symptomatic Anemia, normocytic -Admit inpatient telemetry -She presented with a Hgb/Hct of 8.8/28.2 and repeat was 8.6/27.3; She was typed and screened and transfused  2 units of pRBC's -After Transfusion of 2 units packed red blood cells her Hgb/Hct  yesterday AM was 10.2/31.7 and today slightly dropped to 9.4/30.3 but is stable today and is now 9.6/31.5 -Anemia panel was checked and showed an iron level of 21, U IBC 307, TIBC 328, saturation ratios of 6%, ferritin level 3, folate level 8.6, and vitamin B12 level of 324 -FOBT still pending -Continue to monitor for signs and symptoms of bleeding; currently no overt bleeding noted -Can likely have her anemia worked up in outpatient setting -Will likely need Heme follow up as outpatient. Also needs to update GYN healthcare maintenance.  Elevated Troponin Volume Overload and Dyspnea in the setting of Acute systolic and grade 2 diastolic congestive heart failure with an EF of 35 to 40%, poA -Continue to Monitor on Telemetry -BNP was 391.0 on Admission and Troponin went from 19 -> 21 -> 21 -Continue to Monitor Stric Intake and output, and Daily Weights -C/w Supplemental O2 via [place] and Wean patient to Room Air -Continuous Pulse Oximetry and Maintain O2 Sats >92% -SpO2: 93 % -C/w nebs as needed -Started on Daily Lasix and given IV 60 mg yesterday and received IV 20 mg yesterday AM but was changed to IV 60 every 12 today and this was changed to p.o. this a.m. by cardiology to 40 mg p.o. daily -C/w Aspirin 81 mg po Daily, Beta-Blocker with Metoprolol Succinate 12.5 mg po Daily changed to carvedilol 3.125 mg p.o. twice daily by cardiology  and increased to 6.25 mg twice daily today and ACE inhibitor with Lisinopril 2.5 mg Daily was increased to 5 mg p.o. daily -TSH was 3.286 -Checked Fibrin Derivatives D Dimer was 1574.20 to rule out PE and will get CTA Chest and LE Venous Duplex -CTA Chest showed "No evidence of pulmonary embolism. Diffuse interseptal thickening, scattered ground-glass infiltrates, and BILATERAL pleural effusions, favor pulmonary edema. Compressive atelectasis at lung bases. Enlargement of cardiac chambers. 6 mm RIGHT lower lobe pulmonary nodule unchanged;" -Check LE Venous Duplex  and showed "No evidence of DVT within either lower extremity." -Check Cardiac Transthoracic ECHOCardiogram and showed an EF of 35 to 40% with grade 2 diastolic dysfunction -Strict I's and O's and daily weights; patient is -1.708 L since admission and weight is questionable and unclear if this is accurate and has been to 35 for last 3 days -Cardiology consulted for further evaluation and recommendations; Appreciate their assistance  -Lexiscan Myoview done so that there is no ST segment deviation noted during stress and there is a T wave inversion noted during the stress and 1 in aVL leads.  The defect 1 was there is a small defect of the mild severity persistent in the apex location likely due to breast attenuation artifact.  The nuclear stress EF was 30%.  And this was a high risk study due to her low EF pathology seem to be a nonischemic cardiomyopathy -Cardiology recommends discharge and following up with PCP as well as cardiology within 1 to 2 weeks and she will be changed to [facility] in the outpatient setting and had spironolactone added in  Uncontrolled Diabetes Mellitus Type 2 with Hyperglycemia without DKA. -C/w Heart healthy, carb controlled diet -Checked A1c and was 12.1 -CBGs ranging from 98-166 now -Started the patient on insulin glargine 21 units subcu daily along with moderate NovoLog/scale insulin before meals and at bedtime but will decrease down to sensitive NovoLog sliding scale insulin before meals and at bedtime -We will have the patient on 5 units  subcu 3 times daily with meals  -Diabetes education coordinator consulted for further evaluation recommendations appreciate their assistance and if patient will be discharged she can be discharged on 70/30 14 units twice daily and she was given a prescription for this as well as glucose meter -Continue to monitor blood sugars carefully and adjust insulin regimen as necessary  Hypertension -Medical therapy per cardiology and they have  changed her metoprolol to carvedilol -Continue with lisinopril 5 mg p.o. daily -Currently getting diuresed and has now been changed to p.o. diuresis by cardiology -Blood pressure is 129/83 -Continue with carvedilol 6.25 mg p.o. twice daily  Cocaine Abuse -UDS was positive for cocaine -Continue cocaine counseling and cessation  Right lower lobe pulmonary nodule -Incidental finding -Was 6 mm and stable so recommending a repeat scan in 18 months   History ofGERD -PPI as needed  Thrombocytosis -Patient's Platelet Count was 527 -> 505 -> 455 -> 458 -> 441 -Continue to Monitor carefully -Repeat CBC in the AM  Obesity -Estimated body mass index is 36.84 kg/m as calculated from the following:   Height as of this encounter: _0  (1.702 m).   Weight as of this encounter: 106.7 kg. -Weight Loss and Dietary Counseling given   Hypomagnesemia -Patient's magnesium level is 1.8 -Continue to monitor and replete as necessary -Repeat magnesium level in the a.m.  Hypokalemia -Patient's potassium was 2.9 -Replete with p.o. potassium chloride 40 mg 3 times daily x3 doses yesterday -Continue to monitor and replete as necessary -Repeat CMP in a.m.  Discharge Instructions  Discharge Instructions    (HEART FAILURE PATIENTS) Call MD:  Anytime you have any of the following symptoms: 1) 3 pound weight gain in 24 hours or 5 pounds in 1 week 2) shortness of breath, with or without a dry hacking cough 3) swelling in the hands, feet or stomach 4) if you have to sleep on extra pillows at night in order to breathe.   Complete by: As directed    Call MD for:  difficulty breathing, headache or visual disturbances   Complete by: As directed    Call MD for:  extreme fatigue   Complete by: As directed    Call MD for:  hives   Complete by: As directed    Call MD for:  persistant dizziness or light-headedness   Complete by: As directed    Call MD for:  persistant nausea and vomiting   Complete  by: As directed    Call MD for:  redness, tenderness, or signs of infection (pain, swelling, redness, odor or green/yellow discharge around incision site)   Complete by: As directed    Call MD for:  severe uncontrolled pain   Complete by: As directed    Call MD for:  temperature >100.4   Complete by: As directed    Diet - low sodium heart healthy   Complete by: As directed    Diet Carb Modified   Complete by: As directed    Discharge instructions   Complete by: As directed    You were cared for by a hospitalist during your hospital stay. If you have any questions about your discharge medications or the care you received while you were in the hospital after you are discharged, you can call the unit and ask to speak with the hospitalist on call if the hospitalist that took care of you is not available. Once you are discharged, your primary care physician will handle any further medical issues. Please note that  NO REFILLS for any discharge medications will be authorized once you are discharged, as it is imperative that you return to your primary care physician (or establish a relationship with a primary care physician if you do not have one) for your aftercare needs so that they can reassess your need for medications and monitor your lab values.  Follow up with PCP and Cardiology. Take all medications as prescribed. If symptoms change or worsen please return to the ED for evaluation   Increase activity slowly   Complete by: As directed      Allergies as of 10/05/2019      Reactions   No [employer] Food Allergies Rash, Other (See Comments)   Lemons      Medication List    STOP taking these medications   metFORMIN 500 MG tablet Commonly known as: GLUCOPHAGE     TAKE these medications   acetaminophen 325 MG tablet Commonly known as: TYLENOL Take 2 tablets (650 mg total) by mouth every 6 (six) hours as needed for mild pain (or Fever >/= 101).   aspirin 81 MG EC tablet Take 1 tablet  (81 mg total) by mouth daily. Start taking on: October 06, 2019   blood glucose meter kit and supplies Kit Dispense based on patient and insurance preference. Use up to four times daily as directed. (FOR ICD-9 250.00, 250.01).   carvedilol 6.25 MG tablet Commonly known as: COREG Take 1 tablet (6.25 mg total) by mouth 2 (two) times daily with a meal.   furosemide 40 MG tablet Commonly known as: LASIX Take 1 tablet (40 mg total) by mouth daily. Start taking on: October 06, 2019   lisinopril 5 MG tablet Commonly known as: ZESTRIL Take 1 tablet (5 mg total) by mouth daily. Start taking on: October 06, 2019   NovoLIN 70/30 FlexPen Relion (70-30) 100 UNIT/ML KwikPen Generic drug: insulin isophane & regular human Inject 14 Units into the skin in the morning and at bedtime.   ondansetron 4 MG tablet Commonly known as: ZOFRAN Take 1 tablet (4 mg total) by mouth every 6 (six) hours as needed for nausea.   ReliOn Mini Pen Needles 31G X 6 MM Misc Generic drug: Insulin Pen Needle 1 Container by Does not apply route in the morning and at bedtime.   senna-docusate 8.6-50 MG tablet Commonly known as: Senokot-S Take 1 tablet by mouth at bedtime as needed for mild constipation.       Allergies  Allergen Reactions  . No [employer] Food Allergies Rash and Other (See Comments)    Lemons    Consultations:  Cardiology  Procedures/Studies: DG Chest 1 View  Result Date: 10/05/2019 CLINICAL DATA:  Dyspnea EXAM: CHEST  1 VIEW COMPARISON:  October 02, 2019 FINDINGS: There is mild prominence of the cardiac silhouette. There is mildly increased central pulmonary vasculature. No large airspace consolidation or pleural effusion. IMPRESSION: Mild cardiomegaly and pulmonary vascular congestion. Electronically Signed   By: Prudencio Pair M.D.   On: 10/05/2019 05:55   CT ANGIO CHEST PE W OR WO CONTRAST  Result Date: 10/03/2019 CLINICAL DATA:  Lower extremity swelling for 1 week, shortness of breath with minimal  exertion beginning 24 hours ago, smoker EXAM: CT ANGIOGRAPHY CHEST WITH CONTRAST TECHNIQUE: Multidetector CT imaging of the chest was performed using the standard protocol during bolus administration of intravenous contrast. Multiplanar CT image reconstructions and MIPs were obtained to evaluate the vascular anatomy. CONTRAST:  74m OMNIPAQUE IOHEXOL 350 MG/ML SOLN IV COMPARISON:  04/10/2019 FINDINGS:  Cardiovascular: Enlargement of cardiac chambers. No pericardial effusion. Aorta normal caliber without aneurysm or dissection. Pulmonary arteries adequately opacified and patent. No evidence of pulmonary embolism. Slightly distended IVC. Mediastinum/Nodes: Esophagus unremarkable. Base of cervical region normal appearance. No thoracic adenopathy. Lungs/Pleura: BILATERAL pleural effusions and compressive atelectasis of the posterior lungs. Interseptal thickening throughout both lungs with patchy areas of ground-glass infiltrates favor pulmonary edema. No segmental consolidation or pneumothorax. 6 mm RIGHT lower lobe nodule unchanged. Upper Abdomen: Unremarkable Musculoskeletal: Normal appearance Review of the MIP images confirms the above findings. IMPRESSION: No evidence of pulmonary embolism. Diffuse interseptal thickening, scattered ground-glass infiltrates, and BILATERAL pleural effusions, favor pulmonary edema. Compressive atelectasis at lung bases. Enlargement of cardiac chambers. 6 mm RIGHT lower lobe pulmonary nodule unchanged; since this is stable at 6 months, follow-up CT in 18 months (from today's scan) is considered optional for low-risk patients, but is recommended for high-risk patients. This recommendation follows the consensus statement: Guidelines for Management of Incidental Pulmonary Nodules Detected on CT Images: From the Fleischner Society 2017; Radiology 2017; 284:228-243. Electronically Signed   By: Lavonia Dana M.D.   On: 10/03/2019 12:11   NM Myocar Multi W/Spect W/Wall Motion / EF  Result  Date: 10/05/2019  There was no ST segment deviation noted during stress.  T wave inversion was noted during stress in the I and aVL leads.  Defect 1: There is a small defect of mild severity present in the apex location. This is likely due to breast attenuation artifact  Nuclear stress EF: 30%.  This is a high risk study due to low EF.  Low EF seems to be due to nonischemic cardiomyopathy.    US Venous Img Lower Bilateral (DVT)  Result Date: 10/03/2019 CLINICAL DATA:  Bilateral lower extremity edema. History of smoking. Evaluate for DVT. EXAM: BILATERAL LOWER EXTREMITY VENOUS DOPPLER ULTRASOUND TECHNIQUE: Gray-scale sonography with graded compression, as well as color Doppler and duplex ultrasound were performed to evaluate the lower extremity deep venous systems from the level of the common femoral vein and including the common femoral, femoral, profunda femoral, popliteal and calf veins including the posterior tibial, peroneal and gastrocnemius veins when visible. The superficial great saphenous vein was also interrogated. Spectral Doppler was utilized to evaluate flow at rest and with distal augmentation maneuvers in the common femoral, femoral and popliteal veins. COMPARISON:  None. FINDINGS: RIGHT LOWER EXTREMITY Common Femoral Vein: No evidence of thrombus. Normal compressibility, respiratory phasicity and response to augmentation. Saphenofemoral Junction: No evidence of thrombus. Normal compressibility and flow on color Doppler imaging. Profunda Femoral Vein: No evidence of thrombus. Normal compressibility and flow on color Doppler imaging. Femoral Vein: No evidence of thrombus. Normal compressibility, respiratory phasicity and response to augmentation. Popliteal Vein: No evidence of thrombus. Normal compressibility, respiratory phasicity and response to augmentation. Calf Veins: No evidence of thrombus. Normal compressibility and flow on color Doppler imaging. Superficial Great Saphenous Vein: No  evidence of thrombus. Normal compressibility. Venous Reflux:  None. Other Findings:  None. LEFT LOWER EXTREMITY Common Femoral Vein: No evidence of thrombus. Normal compressibility, respiratory phasicity and response to augmentation. Saphenofemoral Junction: No evidence of thrombus. Normal compressibility and flow on color Doppler imaging. Profunda Femoral Vein: No evidence of thrombus. Normal compressibility and flow on color Doppler imaging. Femoral Vein: No evidence of thrombus. Normal compressibility, respiratory phasicity and response to augmentation. Popliteal Vein: No evidence of thrombus. Normal compressibility, respiratory phasicity and response to augmentation. Calf Veins: No evidence of thrombus. Normal compressibility and flow on color  Doppler imaging. Superficial Great Saphenous Vein: No evidence of thrombus. Normal compressibility. Venous Reflux:  None. Other Findings:  None. IMPRESSION: No evidence of DVT within either lower extremity. Electronically Signed   By: Sandi Mariscal M.D.   On: 10/03/2019 13:09   DG Chest Portable 1 View  Result Date: 10/02/2019 CLINICAL DATA:  Bilateral leg swelling, shortness of breath EXAM: PORTABLE CHEST 1 VIEW COMPARISON:  04/10/2019 FINDINGS: Heart is borderline in size. Patchy bilateral opacities. No effusions. No acute bony abnormality. IMPRESSION: Patchy bilateral opacities could reflect edema or infection. Electronically Signed   By: Rolm Baptise M.D.   On: 10/02/2019 19:46   ECHOCARDIOGRAM COMPLETE  Result Date: 10/03/2019    ECHOCARDIOGRAM REPORT   Patient Name:   Molly Howard Date of Exam: 10/03/2019 Medical Rec #:  [id number]     Height:       67.0 in Accession #:    [id number]    Weight:       235.8 lb Date of Birth:  Sep 15, 1985     BSA:          2.169 m Patient Age:    70 years      BP:           149/100 mmHg Patient Gender: F             HR:           101 bpm. Exam Location:  [facility] Procedure: 2D Echo, Color Doppler and Cardiac Doppler Indications:     I50.9  Congestive Heart Failure  History:         Patient has no prior history of Echocardiogram examinations.                  Signs/Symptoms:Shortness of Breath and Leg edema; Risk                  Factors:Diabetes.  Sonographer:     Charmayne Sheer RDCS (AE) Referring Phys:  [id number] Harvie Bridge Diagnosing Phys: [name] MD  Sonographer Comments: No subcostal window. IMPRESSIONS  1. Left ventricular ejection fraction, by estimation, is 35 to 40%. The left ventricle has moderately decreased function. The left ventricle demonstrates global hypokinesis. Left ventricular diastolic parameters are consistent with Grade II diastolic dysfunction (pseudonormalization).  2. Right ventricular systolic function is normal. The right ventricular size is normal.  3. The mitral valve is normal in structure. No evidence of mitral valve regurgitation. No evidence of mitral stenosis.  4. The aortic valve is normal in structure. Aortic valve regurgitation is not visualized. No aortic stenosis is present.  5. The inferior vena cava is normal in size with greater than 50% respiratory variability, suggesting right atrial pressure of 3 mmHg. FINDINGS  Left Ventricle: Left ventricular ejection fraction, by estimation, is 35 to 40%. The left ventricle has moderately decreased function. The left ventricle demonstrates global hypokinesis. The left ventricular internal cavity size was normal in size. There is no left ventricular hypertrophy. Left ventricular diastolic parameters are consistent with Grade II diastolic dysfunction (pseudonormalization). Right Ventricle: The right ventricular size is normal. No increase in right ventricular wall thickness. Right ventricular systolic function is normal. Left Atrium: Left atrial size was normal in size. Right Atrium: Right atrial size was normal in size. Pericardium: There is no evidence of pericardial effusion. Mitral Valve: The mitral valve is normal in structure. Normal mobility of the  mitral valve leaflets. No evidence of mitral valve regurgitation. No evidence of mitral valve stenosis.  MV peak gradient, 6.2 mmHg. The mean mitral valve gradient is 2.0 mmHg. Tricuspid Valve: The tricuspid valve is normal in structure. Tricuspid valve regurgitation is not demonstrated. No evidence of tricuspid stenosis. Aortic Valve: The aortic valve is normal in structure. Aortic valve regurgitation is not visualized. No aortic stenosis is present. Aortic valve mean gradient measures 5.0 mmHg. Aortic valve peak gradient measures 8.3 mmHg. Aortic valve area, by VTI measures 2.32 cm. Pulmonic Valve: The pulmonic valve was normal in structure. Pulmonic valve regurgitation is not visualized. No evidence of pulmonic stenosis. Aorta: The aortic root is normal in size and structure. Venous: The inferior vena cava is normal in size with greater than 50% respiratory variability, suggesting right atrial pressure of 3 mmHg. IAS/Shunts: No atrial level shunt detected by color flow Doppler.  LEFT VENTRICLE PLAX 2D LVIDd:         5.39 cm      Diastology LVIDs:         4.40 cm      LV e' lateral:   7.51 cm/s LV PW:         0.97 cm      LV E/e' lateral: 15.1 LV IVS:        0.76 cm      LV e' medial:    6.20 cm/s LVOT diam:     2.00 cm      LV E/e' medial:  18.3 LV SV:         58 LV SV Index:   27 LVOT Area:     3.14 cm  LV Volumes (MOD) LV vol d, MOD A2C: 139.0 ml LV vol d, MOD A4C: 119.0 ml LV vol s, MOD A2C: 81.2 ml LV vol s, MOD A4C: 75.2 ml LV SV MOD A2C:     57.8 ml LV SV MOD A4C:     119.0 ml LV SV MOD BP:      53.9 ml RIGHT VENTRICLE RV Basal diam:  3.04 cm LEFT ATRIUM             Index       RIGHT ATRIUM           Index LA diam:        3.90 cm 1.80 cm/m  RA Area:     10.60 cm LA Vol (A2C):   34.8 ml 16.05 ml/m RA Volume:   24.70 ml  11.39 ml/m LA Vol (A4C):   32.6 ml 15.03 ml/m LA Biplane Vol: 34.6 ml 15.95 ml/m  AORTIC VALVE                    PULMONIC VALVE AV Area (Vmax):    2.25 cm     PV Vmax:       0.80 m/s  AV Area (Vmean):   2.25 cm     PV Vmean:      57.700 cm/s AV Area (VTI):     2.32 cm     PV VTI:        0.143 m AV Vmax:           144.00 cm/s  PV Peak grad:  2.6 mmHg AV Vmean:          105.000 cm/s PV Mean grad:  2.0 mmHg AV VTI:            0.250 m AV Peak Grad:      8.3 mmHg AV Mean Grad:      5.0 mmHg LVOT Vmax:  103.00 cm/s LVOT Vmean:        75.300 cm/s LVOT VTI:          0.185 m LVOT/AV VTI ratio: 0.74  AORTA Ao Root diam: 2.60 cm MITRAL VALVE MV Area (PHT): 3.09 cm     SHUNTS MV Peak grad:  6.2 mmHg     Systemic VTI:  0.18 m MV Mean grad:  2.0 mmHg     Systemic Diam: 2.00 cm MV Vmax:       1.24 m/s MV Vmean:      71.6 cm/s MV Decel Time: 245 msec MV E velocity: 113.33 cm/s [name] MD Electronically signed by [name] MD Signature Date/Time: 10/03/2019/1:42:17 PM    Final    NM STRESS TEST LEXISCAN MYOVIEW  There was no ST segment deviation noted during stress.  T wave inversion was noted during stress in the I and aVL leads.  Defect 1: There is a small defect of mild severity present in the apex location. This is likely due to breast attenuation artifact  Nuclear stress EF: 30%.  This is a high risk study due to low EF.  Low EF seems to be due to nonischemic cardiomyopathy.   Subjective: Seen and examined at bedside and she was doing fairly well.  She just come back from my view.  Had no complaints.  States that her swelling is improved significantly.  No lightheadedness or dizziness.  Stable for discharge at this time cardiology has cleared her.  Discharge Exam: Vitals:   10/05/19 0952 10/05/19 1117  BP: (!) 137/96 136/88  Pulse: 89 91  Resp: 18 17  Temp: 98 F (36.7 C) 97.9 F (36.6 C)  SpO2: 100% 96%   Vitals:   10/04/19 1502 10/05/19 0410 10/05/19 0952 10/05/19 1117  BP: 129/81 121/81 (!) 137/96 136/88  Pulse: 87 84 89 91  Resp: _0 Temp: 98.7 F (37.1 C) 98.3 F (36.8 C) 98 F (36.7 C) 97.9 F (36.6 C)  TempSrc: Oral Oral Oral    SpO2: 93% 98% 100% 96%  Weight:  105.5 kg    Height:       General: Pt is alert, awake, not in acute distress Cardiovascular: RRR, S1/S2 +, no rubs, no gallops Respiratory: Diminished to auscultation bilaterally, no wheezing, no rhonchi; unlabored breathing Abdominal: Soft, NT, distended, bowel sounds + Extremities: 1+ edema, no cyanosis  The results of significant diagnostics from this hospitalization (including imaging, microbiology, ancillary and laboratory) are listed below for reference.    Microbiology: Recent Results (from the past 240 hour(s))  SARS Coronavirus 2 by RT PCR (hospital order, performed in [facility] hospital lab) Nasopharyngeal Nasopharyngeal Swab     Status: None   Collection Time: 10/02/19  7:20 PM   Specimen: Nasopharyngeal Swab  Result Value Ref Range Status   SARS Coronavirus 2 NEGATIVE NEGATIVE Final    Comment: (NOTE) SARS-CoV-2 target nucleic acids are NOT DETECTED. The SARS-CoV-2 RNA is generally detectable in upper and lower respiratory specimens during the acute phase of infection. The lowest concentration of SARS-CoV-2 viral copies this assay can detect is 250 copies / mL. A negative result does not preclude SARS-CoV-2 infection and should not be used as the sole basis for treatment or other patient management decisions.  A negative result may occur with improper specimen collection / handling, submission of specimen other than nasopharyngeal swab, presence of viral mutation(s) within the areas targeted by this assay, and inadequate number of viral copies (<250  copies / mL). A negative result must be combined with clinical observations, patient history, and epidemiological information. Fact Sheet for Patients:   StrictlyIdeas.no Fact Sheet for Healthcare Providers: BankingDealers.co.za This test is not yet approved or cleared  by the [place] FDA and has been authorized for detection and/or  diagnosis of SARS-CoV-2 by FDA under an Emergency Use Authorization (EUA).  This EUA will remain in effect (meaning this test can be used) for the duration of the COVID-19 declaration under Section 564(b)(1) of the Act, 21 U.S.C. section 360bbb-3(b)(1), unless the authorization is terminated or revoked sooner. Performed at [facility], [address] [place], [place] [address]     Labs: BNP (last 3 results) Recent Labs    10/02/19 1348  BNP 993.5*   Basic Metabolic Panel: Recent Labs  Lab 10/02/19 1348 10/02/19 2124 10/02/19 2357 10/03/19 1202 10/04/19 0555 10/05/19 0545  NA 135  --  137 135 136 138  K 3.6  --  3.8 3.5 3.2* 3.9  CL 100  --  100 99 100 100  CO2 27  --  _0 GLUCOSE 259*  --  385* 232* 141* 138*  BUN 9  --  _1 CREATININE 0.82  --  0.97 0.76 0.80 0.72  CALCIUM 7.9*  --  7.8* 8.0* 7.9* 8.3*  MG  --  1.8  --  1.7 1.6* 1.8  PHOS  --  3.6  --  3.3 4.6 4.9*   Liver Function Tests: Recent Labs  Lab 10/02/19 1348 10/03/19 1202 10/04/19 0555 10/05/19 0545  AST 14* 21 14* 14*  ALT _2 ALKPHOS 84 78 67 72  BILITOT 0.7 1.2 0.5 0.4  PROT 6.3* 6.0* 5.5* 5.7*  ALBUMIN 2.2* 2.3* 2.0* 2.2*   No results for input(s): LIPASE, AMYLASE in the last 168 hours. No results for input(s): AMMONIA in the last 168 hours. CBC: Recent Labs  Lab 10/02/19 1348 10/02/19 1348 10/02/19 2357 10/03/19 0500 10/03/19 1202 10/04/19 0555 10/05/19 0545  WBC 8.0  --  7.4  --  8.7 7.7 8.7  NEUTROABS  --   --   --   --  6.9 5.0 5.7  HGB 8.8*   < > 8.7* 8.6* 10.2* 9.4* 9.6*  HCT 28.2*   < > 29.1* 27.3* 31.7* 30.3* 31.5*  MCV 82.9  --  85.6  --  82.3 83.9 84.7  PLT 527*  --  505*  --  455* 458* 441*   < > = values in this interval not displayed.   Cardiac Enzymes: No results for input(s): CKTOTAL, CKMB, CKMBINDEX, TROPONINI in the last 168 hours. BNP: Invalid input(s): POCBNP CBG: Recent Labs  Lab 10/04/19 1143 10/04/19 1705  10/04/19 2039 10/05/19 0951 10/05/19 1216  GLUCAP 98 127* 146* 175* 188*   D-Dimer No results for input(s): DDIMER in the last 72 hours. Hgb A1c Recent Labs    10/02/19 2124  HGBA1C 12.1*   Lipid Profile No results for input(s): CHOL, HDL, LDLCALC, TRIG, CHOLHDL, LDLDIRECT in the last 72 hours. Thyroid function studies No results for input(s): TSH, T4TOTAL, T3FREE, THYROIDAB in the last 72 hours.  Invalid input(s): FREET3 Anemia work up Recent Labs    10/02/19 2124 10/02/19 2357  VITAMINB12 324  --   FOLATE 8.6  --   FERRITIN 3*  --   TIBC  --  328  IRON  --  21*   Urinalysis No results found for:  COLORURINE, APPEARANCEUR, [place], [place], GLUCOSEU, HGBUR, BILIRUBINUR, [place], PROTEINUR, UROBILINOGEN, NITRITE, LEUKOCYTESUR Sepsis Labs Invalid input(s): PROCALCITONIN,  WBC,  LACTICIDVEN Microbiology Recent Results (from the past 240 hour(s))  SARS Coronavirus 2 by RT PCR (hospital order, performed in [facility] hospital lab) Nasopharyngeal Nasopharyngeal Swab     Status: None   Collection Time: 10/02/19  7:20 PM   Specimen: Nasopharyngeal Swab  Result Value Ref Range Status   SARS Coronavirus 2 NEGATIVE NEGATIVE Final    Comment: (NOTE) SARS-CoV-2 target nucleic acids are NOT DETECTED. The SARS-CoV-2 RNA is generally detectable in upper and lower respiratory specimens during the acute phase of infection. The lowest concentration of SARS-CoV-2 viral copies this assay can detect is 250 copies / mL. A negative result does not preclude SARS-CoV-2 infection and should not be used as the sole basis for treatment or other patient management decisions.  A negative result may occur with improper specimen collection / handling, submission of specimen other than nasopharyngeal swab, presence of viral mutation(s) within the areas targeted by this assay, and inadequate number of viral copies (<250 copies / mL). A negative result must be combined with  clinical observations, patient history, and epidemiological information. Fact Sheet for Patients:   StrictlyIdeas.no Fact Sheet for Healthcare Providers: BankingDealers.co.za This test is not yet approved or cleared  by the [place] FDA and has been authorized for detection and/or diagnosis of SARS-CoV-2 by FDA under an Emergency Use Authorization (EUA).  This EUA will remain in effect (meaning this test can be used) for the duration of the COVID-19 declaration under Section 564(b)(1) of the Act, 21 U.S.C. section 360bbb-3(b)(1), unless the authorization is terminated or revoked sooner. Performed at [facility], [address] [place], [place] [address]    Time coordinating discharge: 35 minutes  SIGNED:  Kerney Elbe, DO [facility] 10/05/2019, 2:19 PM Pager is on [place]  If 7PM-7AM, please contact night-coverage [email or web address]

## 2019-10-05 NOTE — Progress Notes (Signed)
Progress Note  Patient Name: Molly Howard Date of Encounter: 10/05/2019  Primary Cardiologist: New to [facility] - consult by [place]  Subjective   Dyspnea and lower extremity swelling improving. No chest pain. Documented UOP 3.0 L for the past 24 hours with a net - 4.3 L for the admission. Weight 107-->106.7-->105.5 kg. Renal function stable. Potassium improved to 3.9 this morning. HGB 10.2 s/p 2 units pRBC trending to 9.4 and stable at 9.6 this morning. Magnesium 1.8. She is for [facility] MPI today.    Inpatient Medications    Scheduled Meds: . aspirin EC  81 mg Oral Daily  . carvedilol  3.125 mg Oral BID WC  . furosemide  60 mg Intravenous BID  . insulin aspart  0-5 Units Subcutaneous QHS  . insulin aspart  0-9 Units Subcutaneous TID WC  . insulin aspart  5 Units Subcutaneous TID WC  . insulin glargine  21 Units Subcutaneous Daily  . lisinopril  5 mg Oral Daily  . melatonin  5 mg Oral QHS  . sodium chloride flush  3 mL Intravenous Q12H   Continuous Infusions:  PRN Meds: acetaminophen **OR** acetaminophen, albuterol, bisacodyl, HYDROcodone-acetaminophen, ipratropium, morphine injection, ondansetron **OR** ondansetron (ZOFRAN) IV, senna-docusate, technetium tetrofosmin   Vital Signs    Vitals:   10/04/19 0806 10/04/19 1144 10/04/19 1502 10/05/19 0410  BP: (!) 129/93 122/85 129/81 121/81  Pulse: 99 87 87 84  Resp: 17 17 18 20   Temp: 98.5 F (36.9 C) 98.1 F (36.7 C) 98.7 F (37.1 C) 98.3 F (36.8 C)  TempSrc: Oral Oral Oral Oral  SpO2: 96% 100% 93% 98%  Weight:    105.5 kg  Height:        Intake/Output Summary (Last 24 hours) at 10/05/2019 0729 Last data filed at 10/05/2019 0220 Gross per 24 hour  Intake 600 ml  Output 3650 ml  Net -3050 ml   Filed Weights   10/03/19 0340 10/04/19 0435 10/05/19 0410  Weight: 107 kg 106.7 kg 105.5 kg    Telemetry    SR - Personally Reviewed  ECG    No new tracings - Personally Reviewed  Physical Exam   GEN: No acute  distress.   Neck: No JVD. Cardiac: RRR, no murmurs, rubs, or gallops.  Respiratory: Improving breath sounds, mildly diminished along the bilateral bases.  GI: Soft, nontender, non-distended.   MS: Trace bilateral lower extremity pitting edema; No deformity. Neuro:  Alert and oriented x 3; Nonfocal.  Psych: Normal affect.  Labs    Chemistry Recent Labs  Lab 10/03/19 1202 10/04/19 0555 10/05/19 0545  NA 135 136 138  K 3.5 3.2* 3.9  CL 99 100 100  CO2 27 29 28   GLUCOSE 232* 141* 138*  BUN 10 11 14   CREATININE 0.76 0.80 0.72  CALCIUM 8.0* 7.9* 8.3*  PROT 6.0* 5.5* 5.7*  ALBUMIN 2.3* 2.0* 2.2*  AST 21 14* 14*  ALT 11 11 13   ALKPHOS 78 67 72  BILITOT 1.2 0.5 0.4  GFRNONAA >60 >60 >60  GFRAA >60 >60 >60  ANIONGAP 9 7 10      Hematology Recent Labs  Lab 10/03/19 1202 10/04/19 0555 10/05/19 0545  WBC 8.7 7.7 8.7  RBC 3.85* 3.61* 3.72*  HGB 10.2* 9.4* 9.6*  HCT 31.7* 30.3* 31.5*  MCV 82.3 83.9 84.7  MCH 26.5 26.0 25.8*  MCHC 32.2 31.0 30.5  RDW 15.6* 15.8* 15.9*  PLT 455* 458* 441*    Cardiac EnzymesNo results for input(s): TROPONINI in  the last 168 hours. No results for input(s): TROPIPOC in the last 168 hours.   BNP Recent Labs  Lab 10/02/19 1348  BNP 391.0*     DDimer No results for input(s): DDIMER in the last 168 hours.   Radiology    CT ANGIO CHEST PE W OR WO CONTRAST  Result Date: 10/03/2019 IMPRESSION: No evidence of pulmonary embolism. Diffuse interseptal thickening, scattered ground-glass infiltrates, and BILATERAL pleural effusions, favor pulmonary edema. Compressive atelectasis at lung bases. Enlargement of cardiac chambers. 6 mm RIGHT lower lobe pulmonary nodule unchanged; since this is stable at 6 months, follow-up CT in 18 months (from today's scan) is considered optional for low-risk patients, but is recommended for high-risk patients. This recommendation follows the consensus statement: Guidelines for Management of Incidental Pulmonary Nodules  Detected on CT Images: From the Fleischner Society 2017; Radiology 2017; 284:228-243. Electronically Signed   By: Ulyses Southward M.D.   On: 10/03/2019 12:11   US Venous Img Lower Bilateral (DVT)  Result Date: 10/03/2019 IMPRESSION: No evidence of DVT within either lower extremity. Electronically Signed   By: Simonne Come M.D.   On: 10/03/2019 13:09   DG Chest Portable 1 View  Result Date: 10/02/2019 IMPRESSION: Patchy bilateral opacities could reflect edema or infection. Electronically Signed   By: Charlett Nose M.D.   On: 10/02/2019 19:46   Cardiac Studies   2D echo 10/03/2019: 1. Left ventricular ejection fraction, by estimation, is 35 to 40%. The  left ventricle has moderately decreased function. The left ventricle  demonstrates global hypokinesis. Left ventricular diastolic parameters are  consistent with Grade II diastolic  dysfunction (pseudonormalization).  2. Right ventricular systolic function is normal. The right ventricular  size is normal.  3. The mitral valve is normal in structure. No evidence of mitral valve  regurgitation. No evidence of mitral stenosis.  4. The aortic valve is normal in structure. Aortic valve regurgitation is  not visualized. No aortic stenosis is present.  5. The inferior vena cava is normal in size with greater than 50%  respiratory variability, suggesting right atrial pressure of 3 mmHg.  Patient Profile     34 y.o. female with history of DM, polysubstance abuse, and GERD who is being seen today for the evaluation of acute HFrEF.  Assessment & Plan    1. Acute HFrEF: -Likely secondary to NICM given her young age, though she does have some risk factors for ischemic heart disease including poorly controlled diabetes, polysubstance abuse including tobacco and cocaine as well as obesity  -Volume status improved -Transition from IV Lasix 60 mg bid to PO Lasix 40 mg bid -Lexiscan MPI today to evaluate for high risk ischemia  -Continue GDMT including  Coreg and lisinopril with consideration to transition to [facility] as an outpatient following 26 hour ACEi washout  -Prior to discharge, look to add spironolactone after she has been adequately diuresed  -Repeat echo in several months time following medication optimization and ischemic evaluation  -CHF education -Strict I/O -Daily weights   2. Elevated HS-Tn: -Minimally elevated and not consistent with ACS -Supply demand ischemia in the setting of anemia -CTA chest negative for PE -No role for heparin gtt at this time without dynamic elevation and in the setting of anemia  -Echo as above -Lexiscan MPI today  3. Anemia: -Improved to 10.2 following 2 units of pRBC and trending to 9.4 and stable at 9.6 this morning -Contributing to her overall presentation -Management per IM  4. Hypoalbuminemia: -Likely contributing to third  spacing playing a role in her lower extremity swelling -Management per primary service   5. Uncontrolled diabetes: -Per IM  6. Sinus tachycardia: -Improved  -Compensatory in the setting of her dyspnea and anemia -CTA chest negative for PE  7. Substance use: -She denies illegal drugs at time of cardiology consult, though cocaine is listed in her chart -UDS positive for cocaine this admission -Complete cessation of substances is recommended   8. Hypokalemia/hypomagnesemia: -Improving  -Replete potassium and magnesium to goal 4.0 and 2.0, respectively   9. Pulmonary nodule: -Outpatient follow up with her PCP  10. HTN: -BP improved -Continue medical therapy as above   For questions or updates, please contact [place] Please consult [email or web address] for contact info under Cardiology/STEMI.    Signed, Christell Faith, PA-C [place] Pager: [phone] 10/05/2019, 7:29 AM

## 2019-10-09 NOTE — Telephone Encounter (Signed)
Ambrose Mantle, MD; P C[facility] [place] Scheduling; Jarvis Newcomer, RN  Patient unable to schedule at this time. Will try to schedule at a later date.

## 2019-10-10 NOTE — Telephone Encounter (Signed)
Patient declined to schedule any appointments. Closing this encounter.

## 2019-10-10 NOTE — Telephone Encounter (Signed)
Attempted to reach patient.  No answer and No VM set up.

## 2019-10-22 ENCOUNTER — Telehealth: Payer: Self-pay | Admitting: Cardiology

## 2019-10-22 NOTE — Telephone Encounter (Signed)
  Patient Consent for Virtual Visit         Molly Howard has provided verbal consent on 10/22/2019 for a virtual visit (video or telephone).   CONSENT FOR VIRTUAL VISIT FOR:  Molly Howard  By participating in this virtual visit I agree to the following:  I hereby voluntarily request, consent and authorize [facility] and its employed or contracted physicians, Producer, television/film/video, nurse practitioners or other licensed health care professionals (the Practitioner), to provide me with telemedicine health care services (the "Services") as deemed necessary by the treating Practitioner. I acknowledge and consent to receive the Services by the Practitioner via telemedicine. I understand that the telemedicine visit will involve communicating with the Practitioner through live audiovisual communication technology and the disclosure of certain medical information by electronic transmission. I acknowledge that I have been given the opportunity to request an in-person assessment or other available alternative prior to the telemedicine visit and am voluntarily participating in the telemedicine visit.  I understand that I have the right to withhold or withdraw my consent to the use of telemedicine in the course of my care at any time, without affecting my right to future care or treatment, and that the Practitioner or I may terminate the telemedicine visit at any time. I understand that I have the right to inspect all information obtained and/or recorded in the course of the telemedicine visit and may receive copies of available information for a reasonable fee.  I understand that some of the potential risks of receiving the Services via telemedicine include:  Marland Kitchen Delay or interruption in medical evaluation due to technological equipment failure or disruption; . Information transmitted may not be sufficient (e.g. poor resolution of images) to allow for appropriate medical decision making by the Practitioner;  and/or  . In rare instances, security protocols could fail, causing a breach of personal health information.  Furthermore, I acknowledge that it is my responsibility to provide information about my medical history, conditions and care that is complete and accurate to the best of my ability. I acknowledge that Practitioner's advice, recommendations, and/or decision may be based on factors not within their control, such as incomplete or inaccurate data provided by me or distortions of diagnostic images or specimens that may result from electronic transmissions. I understand that the practice of medicine is not an exact science and that Practitioner makes no warranties or guarantees regarding treatment outcomes. I acknowledge that a copy of this consent can be made available to me via my patient portal [facility] MyChart), or I can request a printed copy by calling the office of [facility].    I understand that my insurance will be billed for this visit.   I have read or had this consent read to me. . I understand the contents of this consent, which adequately explains the benefits and risks of the Services being provided via telemedicine.  . I have been provided ample opportunity to ask questions regarding this consent and the Services and have had my questions answered to my satisfaction. . I give my informed consent for the services to be provided through the use of telemedicine in my medical care

## 2019-10-31 ENCOUNTER — Telehealth (INDEPENDENT_AMBULATORY_CARE_PROVIDER_SITE_OTHER): Payer: Self-pay | Admitting: Family

## 2019-10-31 ENCOUNTER — Other Ambulatory Visit: Payer: Self-pay

## 2019-10-31 ENCOUNTER — Encounter: Payer: Self-pay | Admitting: Family

## 2019-10-31 VITALS — Ht 67.0 in | Wt 232.0 lb

## 2019-10-31 DIAGNOSIS — I428 Other cardiomyopathies: Secondary | ICD-10-CM

## 2019-10-31 DIAGNOSIS — I5042 Chronic combined systolic (congestive) and diastolic (congestive) heart failure: Secondary | ICD-10-CM

## 2019-10-31 DIAGNOSIS — R911 Solitary pulmonary nodule: Secondary | ICD-10-CM

## 2019-10-31 DIAGNOSIS — Z720 Tobacco use: Secondary | ICD-10-CM

## 2019-10-31 DIAGNOSIS — D649 Anemia, unspecified: Secondary | ICD-10-CM

## 2019-10-31 DIAGNOSIS — F1910 Other psychoactive substance abuse, uncomplicated: Secondary | ICD-10-CM

## 2019-10-31 DIAGNOSIS — Z599 Problem related to housing and economic circumstances, unspecified: Secondary | ICD-10-CM

## 2019-10-31 DIAGNOSIS — Z79899 Other long term (current) drug therapy: Secondary | ICD-10-CM

## 2019-10-31 DIAGNOSIS — Z598 Other problems related to housing and economic circumstances: Secondary | ICD-10-CM

## 2019-10-31 MED ORDER — FUROSEMIDE 40 MG PO TABS: 40.0000 mg | ORAL_TABLET | Freq: Every day | ORAL | 2 refills | Status: DC

## 2019-10-31 MED ORDER — CARVEDILOL 6.25 MG PO TABS: 6.2500 mg | ORAL_TABLET | Freq: Two times a day (BID) | ORAL | 2 refills | Status: DC

## 2019-10-31 NOTE — Patient Instructions (Addendum)
Medication Instructions:   Your physician has recommended you make the following change in your medication:   START Furosemide (Lasix) 40mg  daily in the morning *this is a fluid pill*  START Carvedilol (Coreg) 6.25mg  twice daily *this helps your heart relax and helps your blood pressure*  These medications are $4 for each prescription at [facility] on [facility].  *If you need a refill on your cardiac medications before your next appointment, please call your pharmacy*  Lab Work: No lab work today.  Testing/Procedures: None ordered today.  Your ultrasound of your heart in the hospital showed that your heart did not pump as strong as it should. This can cause fluid to back up and make [employer] feel short of breath and have swelling.    Follow-Up: At [facility], you and your health needs are our priority.  As part of our continuing mission to provide you with exceptional heart care, we have created designated Provider Care Teams.  These Care Teams include your primary Cardiologist (physician) and Advanced Practice Providers (APPs -  Physician Assistants and Nurse Practitioners) who all work together to provide you with the care you need, when you need it.  We recommend signing up for the patient portal called "MyChart".  Sign up information is provided on this After Visit Summary.  MyChart is used to connect with patients for Virtual Visits (Telemedicine).  Patients are able to view lab/test results, encounter notes, upcoming appointments, etc.  Non-urgent messages can be sent to your provider as well.   To learn more about what you can do with MyChart, go to [facility].    Your next appointment:   1 month(s)  The format for your next appointment:   Either In Person or Virtual  Provider:    You may see [email or web address], MD or one of the following Advanced Practice Providers on your designated Care Team:    Debbe Odea, NP  Nicolasa Ducking, PA-C  Eula Listen, PA-C  Marisue Ivan, NP  Other Instructions   Because of your low pumping function of your heart you should eat a low salt diet and keep your legs up when you are sitting down.  We recommend you not drink more than 2 L of fluid per day.   Please continue to avoid cocaine as it can cause the weakened heart muscle.   One of our social workers will reach out to you about assistance with your cardiac medications.   Recommend establishing with a primary care provider. You may call one of the clinics listed below:  1) [facility]: [phone] [address], [place]    2) [facility]: [phone] [address] [place], [place]   3) [id number] clinic: [phone] [address]  [place], [place]  are all very experienced in helping people afford medications and care who do not have health insurance.

## 2019-10-31 NOTE — Progress Notes (Signed)
Virtual Visit via Telephone Note   This visit type was conducted due to national recommendations for restrictions regarding the COVID-19 Pandemic (e.g. social distancing) in an effort to limit this patient's exposure and mitigate transmission in our community.  Due to her co-morbid illnesses, this patient is at least at moderate risk for complications without adequate follow up.  This format is felt to be most appropriate for this patient at this time.  The patient did not have access to video technology/had technical difficulties with video requiring transitioning to audio format only (telephone).  All issues noted in this document were discussed and addressed.  No physical exam could be performed with this format.  Please refer to the patient's chart for her  consent to telehealth for [facility].   The patient was identified using 2 identifiers.  Date:  11/01/2019   ID:  Molly Howard, DOB 11-Nov-1985, MRN [id number]  Patient Location: Home Provider Location: Office  PCP:  Patient, No Pcp Per  Cardiologist:  Debbe Odea, MD  Electrophysiologist:  None   Evaluation Performed:  Follow-Up Visit  Chief Complaint:  Hospital follow up. Shortness of breath. LE edema.  History of Present Illness:    Molly Howard is a 34 y.o. female with HFrEF (10/03/19 EF 35-40%) , diabetes, GERD, substance abuse (cocaine), tobacco use, anemia presents today for hospital follow-up  Previously seen in the ED 04/2019 with chest pain which is attributed to GERD but recommended for cardiology follow-up.  This was not completed.  She presented to the [facility] ED 10/02/2019 for swelling, shortness of breath, PND, dyspnea on exertion.  During mission she required 2 units of PRBC due to anemia.  Echo with LVEF 35 to 40%, grade 2 diastolic dysfunction.  She was diuresed with IV Lasix.  Underwent Lexiscan Myoview to rule out ischemia which was a low risk study.  She did test positive for cocaine during  admission.  Discharge weight 105.5 kg.  Her self-reported weight today is 105.2 kg.  She has taken none of her medications since discharge and she tells me she could not afford them.  She was previously working at [employer] has since lost her job.  Encouraged her to reach out to our office if this occurs again.  She reports intermittent chest pain.  Tells me her shortness of breath is a little bit better but still present.  She endorses lower extremity edema. Overall difficult review of systems as this is a phone visit only.  Of note she was not dyspneic while talking on the phone.  She has a blood pressure cuff at home but tells me she is unable to check today.  Gets around through her sister as she does not have a car and tells me her sister doesn't get off until 6 pm so getting to appointments is difficult.  Long discussion regarding medications and cost.  When discussing her medications with looking at [facility] she tells me she is able to afford $4 per month, possibly $8 per month, but when we discussed $12 per month she tells me that was not feasible.  The patient does not have symptoms concerning for COVID-19 infection (fever, chills, cough, or new shortness of breath).    Past Medical History:  Diagnosis Date  . Acid reflux   . Diabetes mellitus (HCC)   . Obesity    Past Surgical History:  Procedure Laterality Date  . CHOLECYSTECTOMY    . HERNIA REPAIR       No  outpatient medications have been marked as taking for the 10/31/19 encounter (Telemedicine) with Alver Sorrow, NP.     Allergies:   No healthtouch food allergies   Social History   Tobacco Use  . Smoking status: Current Every Day Smoker    Packs/day: 0.50    Types: Cigarettes  . Smokeless tobacco: Never Used  Vaping Use  . Vaping Use: Never used  Substance Use Topics  . Alcohol use: Yes    Comment: ~ 4 shots/day on weekends only  . Drug use: Not Currently    Types: Cocaine    Comment: Last day of use was prior to  June 2021 hosp.visit.      Family Hx: The patient's family history includes Diabetes in her father and mother; Heart failure in her mother; Hypertension in her father.  ROS:   Please see the history of present illness.    Review of Systems  Constitutional: Negative for chills, fever and malaise/fatigue.  Cardiovascular: Positive for chest pain, dyspnea on exertion and leg swelling. Negative for irregular heartbeat, near-syncope, orthopnea, palpitations and syncope.  Respiratory: Positive for cough and shortness of breath. Negative for wheezing.   Gastrointestinal: Negative for melena, nausea and vomiting.  Genitourinary: Negative for hematuria.  Neurological: Negative for dizziness, light-headedness and weakness.    All other systems reviewed and are negative.   Prior CV studies:   The following studies were reviewed today:  2D echo 10/03/2019: 1. Left ventricular ejection fraction, by estimation, is 35 to 40%. The  left ventricle has moderately decreased function. The left ventricle  demonstrates global hypokinesis. Left ventricular diastolic parameters are  consistent with Grade II diastolic  dysfunction (pseudonormalization).   2. Right ventricular systolic function is normal. The right ventricular  size is normal.   3. The mitral valve is normal in structure. No evidence of mitral valve  regurgitation. No evidence of mitral stenosis.   4. The aortic valve is normal in structure. Aortic valve regurgitation is  not visualized. No aortic stenosis is present.   5. The inferior vena cava is normal in size with greater than 50%  respiratory variability, suggesting right atrial pressure of 3 mmHg.  Labs/Other Tests and Data Reviewed:    EKG:  No ECG reviewed.  Recent Labs: 10/02/2019: B Natriuretic Peptide 391.0; TSH 3.286 10/05/2019: ALT 13; BUN 14; Creatinine, Ser 0.72; Hemoglobin 9.6; Magnesium 1.8; Platelets 441; Potassium 3.9; Sodium 138   Recent Lipid Panel No results  found for: CHOL, TRIG, HDL, CHOLHDL, LDLCALC, LDLDIRECT  Wt Readings from Last 3 Encounters:  10/31/19 232 lb (105.2 kg)  10/05/19 232 lb 8 oz (105.5 kg)  04/10/19 220 lb (99.8 kg)     Objective:    Vital Signs:  Ht 5\' 7"  (1.702 m)   Wt 232 lb (105.2 kg)   BMI 36.34 kg/m    VITAL SIGNS:  reviewed  ASSESSMENT & PLAN:    1. HFrEF/NICM -volume status difficult to ascertain over phone visit.  Her weight is stable compared to hospital discharge.  She has not been taking any of her heart failure therapies due to cost.  We will reach out to social work for assistance.  In discussing what is financially feasible at this point she is agreeable to $8.  As such start Lasix and Coreg which will be sent to the [facility] pharmacy.  Educated on low-sodium, heart healthy diet.  Educated on 2 L fluid restriction.  Recommend elevating her lower extremities.  Start Lasix 40 mg daily.  Start Coreg 6.25 mg twice daily.  2. DM - 10/02/19 A1c 12.1.  She was unable to pick up her insulin from [facility] due to cost.  Is not a primary care provider.  Will reach out to social work team, as below.  3. Anemia -received PRBC during recent admission.  Hemoglobin on discharge 10/05/19 of 9.6.  Dietary sources of iron encouraged.  She has been recommended to follow-up with hematology as well as gynecology however in the setting of her current socioeconomic barriers low suspicion this follow-up will be completed.  4. Polysubstance abuse -reports she has not used cocaine since hospital discharge.  Continued cessation encouraged. Smoking cessation encouraged. Recommend utilization of 1800QUITNOW. We discussed that further substance use could negatively impact her heart function.   5. Medication management/financial difficulties -will reach out to social work team to see if they can be of further assistance.  Does appear she [facility] but has not followed up.  Tells me she has lost her  job and does not drive and thus is reliant on her sister who does not get off until 6 PM to get to appointments or pick up medications.  6. Lung nodule -noted on CT.  Recommended for repeat CT in 18 months.  Smoking cessation encouraged, as above.   Time:   Today, I have spent 15 minutes with the patient with telehealth technology discussing the above problems.     Medication Adjustments/Labs and Tests Ordered: Current medicines are reviewed at length with the patient today.  Concerns regarding medicines are outlined above.   Tests Ordered: No orders of the defined types were placed in this encounter.   Medication Changes: Meds ordered this encounter  Medications  . furosemide (LASIX) 40 MG tablet    Sig: Take 1 tablet (40 mg total) by mouth daily.    Dispense:  30 tablet    Refill:  2    Order Specific Question:   Supervising Provider    Answer:   Baldo Daub [id number]  . carvedilol (COREG) 6.25 MG tablet    Sig: Take 1 tablet (6.25 mg total) by mouth 2 (two) times daily with a meal.    Dispense:  60 tablet    Refill:  2    Order Specific Question:   Supervising Provider    Answer:   Baldo Daub [[id number]]    Follow Up:  Either In Person or Virtual in 1 month(s)  Signed, Alver Sorrow, NP  11/01/2019 8:22 AM    [facility]

## 2019-11-01 ENCOUNTER — Telehealth: Payer: Self-pay | Admitting: Family

## 2019-11-01 DIAGNOSIS — I428 Other cardiomyopathies: Secondary | ICD-10-CM | POA: Insufficient documentation

## 2019-11-01 DIAGNOSIS — Z79899 Other long term (current) drug therapy: Secondary | ICD-10-CM | POA: Insufficient documentation

## 2019-11-01 DIAGNOSIS — Z599 Problem related to housing and economic circumstances, unspecified: Secondary | ICD-10-CM | POA: Insufficient documentation

## 2019-11-01 DIAGNOSIS — I420 Dilated cardiomyopathy: Secondary | ICD-10-CM | POA: Insufficient documentation

## 2019-11-01 DIAGNOSIS — F1910 Other psychoactive substance abuse, uncomplicated: Secondary | ICD-10-CM | POA: Insufficient documentation

## 2019-11-01 DIAGNOSIS — Z720 Tobacco use: Secondary | ICD-10-CM | POA: Insufficient documentation

## 2019-11-01 DIAGNOSIS — R911 Solitary pulmonary nodule: Secondary | ICD-10-CM | POA: Insufficient documentation

## 2019-11-01 DIAGNOSIS — I5042 Chronic combined systolic (congestive) and diastolic (congestive) heart failure: Secondary | ICD-10-CM | POA: Insufficient documentation

## 2019-11-01 NOTE — Telephone Encounter (Signed)
-----   Message from Jefferey Pica, RN sent at 11/01/2019  1:23 PM EDT ----- Patient seen 6/30 with Molly Howard- VV- needs a 1 month follow up please!

## 2019-11-01 NOTE — Telephone Encounter (Signed)
Attempted to schedule no ans no vm  

## 2019-11-02 ENCOUNTER — Telehealth: Payer: Self-pay | Admitting: Licensed Clinical Social Worker

## 2019-11-02 NOTE — Telephone Encounter (Signed)
CSW received referral to assist patient with community resources for care needs. CSW attempted to contact patient multiple times but unable to leave message as carrier states call can no go through. CSW will attempt again next week. Lasandra Beech, LCSW, CCSW-MCS [phone]

## 2019-11-06 ENCOUNTER — Telehealth: Payer: Self-pay | Admitting: Licensed Clinical Social Worker

## 2019-11-06 NOTE — Telephone Encounter (Signed)
CSW attempted to contact patient to offer resources and supportive intervention. CSW called both numbers listed in the chart and unable to leave a message at either number. CSW mailed a copy of the [facility] application and contact card for return call to assist with resources. Lasandra Beech, LCSW, CCSW-MCS [phone]

## 2020-01-02 NOTE — Telephone Encounter (Signed)
Attempted to schedule no ans no vm  

## 2020-01-08 NOTE — Telephone Encounter (Signed)
Called x2, no  answer and no vm

## 2020-01-21 NOTE — Telephone Encounter (Signed)
Attempted to schedule no ans no vm  

## 2020-02-27 ENCOUNTER — Telehealth: Payer: Self-pay | Admitting: General Practice

## 2020-02-27 NOTE — Telephone Encounter (Signed)
Pt brought in forms to become a new pt, but the pt did not provide a driver's license, proof of residency, or pay stubs/letter of support. Tried calling both listed numbers, and left a message for her mother.   MD 10/27 @ 3:22

## 2020-02-28 ENCOUNTER — Telehealth: Payer: Self-pay | Admitting: Gerontology

## 2020-02-28 NOTE — Telephone Encounter (Signed)
Unable to LVM for pt @1 :42 pm on 10/28-KW

## 2020-04-02 NOTE — Telephone Encounter (Signed)
Called with no answer, encounter being closed

## 2020-05-19 ENCOUNTER — Encounter: Payer: Self-pay | Admitting: *Deleted

## 2020-05-19 ENCOUNTER — Emergency Department: Payer: Self-pay

## 2020-05-19 ENCOUNTER — Other Ambulatory Visit: Payer: Self-pay

## 2020-05-19 ENCOUNTER — Inpatient Hospital Stay
Admission: EM | Admit: 2020-05-19 | Discharge: 2020-05-22 | DRG: 291 | Disposition: A | Discharge status: Home / Self Care | Payer: Self-pay | Attending: Hospitalist | Admitting: Hospitalist

## 2020-05-19 DIAGNOSIS — Z6831 Body mass index (BMI) 31.0-31.9, adult: Secondary | ICD-10-CM

## 2020-05-19 DIAGNOSIS — Z833 Family history of diabetes mellitus: Secondary | ICD-10-CM

## 2020-05-19 DIAGNOSIS — K219 Gastro-esophageal reflux disease without esophagitis: Secondary | ICD-10-CM | POA: Diagnosis present

## 2020-05-19 DIAGNOSIS — R778 Other specified abnormalities of plasma proteins: Secondary | ICD-10-CM

## 2020-05-19 DIAGNOSIS — R0600 Dyspnea, unspecified: Secondary | ICD-10-CM | POA: Diagnosis present

## 2020-05-19 DIAGNOSIS — E119 Type 2 diabetes mellitus without complications: Secondary | ICD-10-CM

## 2020-05-19 DIAGNOSIS — I10 Essential (primary) hypertension: Secondary | ICD-10-CM

## 2020-05-19 DIAGNOSIS — Z91120 Patient's intentional underdosing of medication regimen due to financial hardship: Secondary | ICD-10-CM

## 2020-05-19 DIAGNOSIS — Y92009 Unspecified place in unspecified non-institutional (private) residence as the place of occurrence of the external cause: Secondary | ICD-10-CM

## 2020-05-19 DIAGNOSIS — T50906A Underdosing of unspecified drugs, medicaments and biological substances, initial encounter: Secondary | ICD-10-CM | POA: Diagnosis present

## 2020-05-19 DIAGNOSIS — E669 Obesity, unspecified: Secondary | ICD-10-CM

## 2020-05-19 DIAGNOSIS — I5043 Acute on chronic combined systolic (congestive) and diastolic (congestive) heart failure: Secondary | ICD-10-CM | POA: Diagnosis present

## 2020-05-19 DIAGNOSIS — Z794 Long term (current) use of insulin: Secondary | ICD-10-CM

## 2020-05-19 DIAGNOSIS — Z79899 Other long term (current) drug therapy: Secondary | ICD-10-CM

## 2020-05-19 DIAGNOSIS — Z8249 Family history of ischemic heart disease and other diseases of the circulatory system: Secondary | ICD-10-CM

## 2020-05-19 DIAGNOSIS — U071 COVID-19: Secondary | ICD-10-CM

## 2020-05-19 DIAGNOSIS — I509 Heart failure, unspecified: Secondary | ICD-10-CM | POA: Insufficient documentation

## 2020-05-19 DIAGNOSIS — I110 Hypertensive heart disease with heart failure: Principal | ICD-10-CM | POA: Diagnosis present

## 2020-05-19 DIAGNOSIS — I248 Other forms of acute ischemic heart disease: Secondary | ICD-10-CM | POA: Diagnosis present

## 2020-05-19 DIAGNOSIS — D509 Iron deficiency anemia, unspecified: Secondary | ICD-10-CM | POA: Diagnosis present

## 2020-05-19 DIAGNOSIS — D649 Anemia, unspecified: Secondary | ICD-10-CM

## 2020-05-19 DIAGNOSIS — E1165 Type 2 diabetes mellitus with hyperglycemia: Secondary | ICD-10-CM

## 2020-05-19 DIAGNOSIS — J1282 Pneumonia due to coronavirus disease 2019: Secondary | ICD-10-CM | POA: Diagnosis present

## 2020-05-19 DIAGNOSIS — Z9114 Patient's other noncompliance with medication regimen: Secondary | ICD-10-CM

## 2020-05-19 DIAGNOSIS — I5042 Chronic combined systolic (congestive) and diastolic (congestive) heart failure: Secondary | ICD-10-CM

## 2020-05-19 DIAGNOSIS — D75838 Other thrombocytosis: Secondary | ICD-10-CM

## 2020-05-19 DIAGNOSIS — D75839 Thrombocytosis, unspecified: Secondary | ICD-10-CM

## 2020-05-19 DIAGNOSIS — R0602 Shortness of breath: Secondary | ICD-10-CM

## 2020-05-19 DIAGNOSIS — F1410 Cocaine abuse, uncomplicated: Secondary | ICD-10-CM | POA: Diagnosis present

## 2020-05-19 DIAGNOSIS — T383X6A Underdosing of insulin and oral hypoglycemic [antidiabetic] drugs, initial encounter: Secondary | ICD-10-CM | POA: Diagnosis present

## 2020-05-19 DIAGNOSIS — Z87891 Personal history of nicotine dependence: Secondary | ICD-10-CM

## 2020-05-19 DIAGNOSIS — R7989 Other specified abnormal findings of blood chemistry: Secondary | ICD-10-CM

## 2020-05-19 DIAGNOSIS — Z7982 Long term (current) use of aspirin: Secondary | ICD-10-CM

## 2020-05-19 LAB — TROPONIN I (HIGH SENSITIVITY)
Troponin I (High Sensitivity): 310 ng/L (ref <18)
Troponin I (High Sensitivity): 280 ng/L (ref <18)

## 2020-05-19 LAB — BASIC METABOLIC PANEL
Anion gap: 13 (ref 5–15)
BUN: 11 mg/dL (ref 6–20)
CO2: 28 mmol/L (ref 22–32)
Calcium: 7.4 mg/dL — ABNORMAL LOW (ref 8.9–10.3)
Chloride: 96 mmol/L — ABNORMAL LOW (ref 98–111)
Creatinine, Ser: 0.98 mg/dL (ref 0.44–1.00)
GFR, Estimated: >60 mL/min (ref >60)
Glucose, Bld: 220 mg/dL — ABNORMAL HIGH (ref 70–99)
Potassium: 3.7 mmol/L (ref 3.5–5.1)
Sodium: 137 mmol/L (ref 135–145)

## 2020-05-19 LAB — CBC
HCT: 35.4 % — ABNORMAL LOW (ref 36.0–46.0)
Hemoglobin: 10.6 g/dL — ABNORMAL LOW (ref 12.0–15.0)
MCH: 25.2 pg — ABNORMAL LOW (ref 26.0–34.0)
MCHC: 29.9 g/dL — ABNORMAL LOW (ref 30.0–36.0)
MCV: 84.3 fL (ref 80.0–100.0)
Platelets: 491 10*3/uL — ABNORMAL HIGH (ref 150–400)
RBC: 4.2 MIL/uL (ref 3.87–5.11)
RDW: 15.6 % — ABNORMAL HIGH (ref 11.5–15.5)
WBC: 4.7 10*3/uL (ref 4.0–10.5)
nRBC: 0 % (ref 0.0–0.2)

## 2020-05-19 LAB — BRAIN NATRIURETIC PEPTIDE: B Natriuretic Peptide: 716.9 pg/mL — ABNORMAL HIGH (ref 0.0–100.0)

## 2020-05-19 MED ORDER — FUROSEMIDE 10 MG/ML IJ SOLN
60.0000 mg | Freq: Once | INTRAMUSCULAR | Status: AC
  Administered 2020-05-19: 60 mg via INTRAVENOUS
  Filled 2020-05-19: qty 8

## 2020-05-19 MED ORDER — ENOXAPARIN SODIUM 60 MG/0.6ML ~~LOC~~ SOLN
50.0000 mg | SUBCUTANEOUS | Status: DC
  Administered 2020-05-20 – 2020-05-22 (×3): 50 mg via SUBCUTANEOUS
  Filled 2020-05-19 (×3): qty 0.6

## 2020-05-19 MED ORDER — INSULIN ASPART 100 UNIT/ML ~~LOC~~ SOLN
0.0000 [IU] | Freq: Every day | SUBCUTANEOUS | Status: DC
  Administered 2020-05-20: 4 [IU] via SUBCUTANEOUS
  Administered 2020-05-20: 5 [IU] via SUBCUTANEOUS
  Filled 2020-05-19 (×2): qty 1

## 2020-05-19 MED ORDER — FUROSEMIDE 10 MG/ML IJ SOLN
40.0000 mg | Freq: Every day | INTRAMUSCULAR | Status: DC
  Administered 2020-05-20 – 2020-05-22 (×3): 40 mg via INTRAVENOUS
  Filled 2020-05-19 (×3): qty 4

## 2020-05-19 MED ORDER — INSULIN ASPART 100 UNIT/ML ~~LOC~~ SOLN
0.0000 [IU] | Freq: Three times a day (TID) | SUBCUTANEOUS | Status: DC
  Administered 2020-05-20: 5 [IU] via SUBCUTANEOUS
  Administered 2020-05-20 (×2): 3 [IU] via SUBCUTANEOUS
  Administered 2020-05-21: 8 [IU] via SUBCUTANEOUS
  Administered 2020-05-21: 11 [IU] via SUBCUTANEOUS
  Administered 2020-05-22: 2 [IU] via SUBCUTANEOUS
  Administered 2020-05-22: 5 [IU] via SUBCUTANEOUS
  Filled 2020-05-19 (×6): qty 1

## 2020-05-19 MED ORDER — ASPIRIN 81 MG PO CHEW
324.0000 mg | CHEWABLE_TABLET | Freq: Once | ORAL | Status: AC
  Administered 2020-05-19: 324 mg via ORAL
  Filled 2020-05-19: qty 4

## 2020-05-19 NOTE — Progress Notes (Signed)
PHARMACIST - PHYSICIAN COMMUNICATION  CONCERNING:  Enoxaparin (Lovenox) for DVT Prophylaxis   RECOMMENDATION: Patient was prescribed enoxaprin 40mg  q24 hours for VTE prophylaxis.   Filed Weights   05/19/20 1512  Weight: 90.7 kg (200 lb)   Body mass index is 31.32 kg/m.  Estimated Creatinine Clearance: 92.6 mL/min (by C-G formula based on SCr of 0.98 mg/dL).  Based on [facility] policy patient is candidate for enoxaparin 0.5mg /kg TBW SQ every 24 hours based on BMI being >30.  DESCRIPTION: Pharmacy has adjusted enoxaparin dose per [facility] policy.  Patient is now receiving enoxaparin 50 mg every 24 hours    [facility], PharmD Clinical Pharmacist  05/19/2020 11:18 PM

## 2020-05-19 NOTE — ED Triage Notes (Addendum)
Pt ambulatory to triage.  Pt reports swelling to both lower legs with sob.   No chest pain.  Sx for 2 weeks   Worse today.   Pt has cough.  No fever.  Pt alert  Speech clear.

## 2020-05-19 NOTE — ED Provider Notes (Signed)
[facility] Emergency Department Provider Note    ____________________________________________   I have reviewed the triage vital signs and the nursing notes.   HISTORY  Chief Complaint Shortness of Breath   History limited by: Not Limited   HPI Molly Howard is a 35 y.o. female who presents to the emergency department today because of concern for shortness of breath and fluid overload. The patient states she has a history of heart failure and has had fluid issues in the past. She has been out of her fluid pill for some time and has intermittently taken some of her mothers. Over the past two weeks she feels like she has had worsening fluid overload and has had worsening shortness of breath over the past few days. This has been accompanied by lower extremity edema as well as abdominal distention. She denies any nausea, vomiting or change in bowel movements. The patient denies any fevers. She has had some central sharp chest pain with these symptoms. She says she does not follow up with a doctor or the heart failure clinic.  Records reviewed. Per medical record review patient has a history of heart failure.   Past Medical History:  Diagnosis Date  . Acid reflux   . Diabetes mellitus ([place])   . Obesity     Patient Active Problem List   Diagnosis Date Noted  . Chronic combined systolic and diastolic heart failure ([place]) 11/01/2019  . Financial difficulties 11/01/2019  . Medication management 11/01/2019  . Lung nodule 11/01/2019  . NICM (nonischemic cardiomyopathy) ([place]) 11/01/2019  . Polysubstance abuse ([place]) 11/01/2019  . Tobacco use 11/01/2019  . HFrEF (heart failure with reduced ejection fraction) ([place])   . Swelling   . Symptomatic anemia 10/02/2019    Past Surgical History:  Procedure Laterality Date  . CHOLECYSTECTOMY    . HERNIA REPAIR      Prior to Admission medications   Medication Sig Start Date End Date Taking? Authorizing Provider   acetaminophen (TYLENOL) 325 MG tablet Take 2 tablets (650 mg total) by mouth every 6 (six) hours as needed for mild pain (or Fever >/= 101). Patient not taking: Reported on 10/31/2019 10/05/19   Raiford Noble Latif, DO  aspirin EC 81 MG EC tablet Take 1 tablet (81 mg total) by mouth daily. Patient not taking: Reported on 10/31/2019 10/06/19   Raiford Noble Latif, DO  blood glucose meter kit and supplies KIT Dispense based on patient and insurance preference. Use up to four times daily as directed. (FOR ICD-9 250.00, 250.01). Patient not taking: Reported on 10/31/2019 10/05/19   Raiford Noble Latif, DO  carvedilol (COREG) 6.25 MG tablet Take 1 tablet (6.25 mg total) by mouth 2 (two) times daily with a meal. 10/31/19   Loel Dubonnet, NP  furosemide (LASIX) 40 MG tablet Take 1 tablet (40 mg total) by mouth daily. 10/31/19   Loel Dubonnet, NP  insulin isophane & regular human (NOVOLIN 70/30 FLEXPEN RELION) (70-30) 100 UNIT/ML KwikPen Inject 14 Units into the skin in the morning and at bedtime. Patient not taking: Reported on 10/31/2019 10/05/19   Raiford Noble Latif, DO  Insulin Pen Needle (RELION MINI PEN NEEDLES) 31G X 6 MM MISC 1 Container by Does not apply route in the morning and at bedtime. Patient not taking: Reported on 10/31/2019 10/05/19   Raiford Noble Latif, DO  lisinopril (ZESTRIL) 5 MG tablet Take 1 tablet (5 mg total) by mouth daily. Patient not taking: Reported on 10/31/2019 10/06/19  Sheikh, Omair Latif, DO  ondansetron (ZOFRAN) 4 MG tablet Take 1 tablet (4 mg total) by mouth every 6 (six) hours as needed for nausea. Patient not taking: Reported on 10/31/2019 10/05/19   Raiford Noble Latif, DO  senna-docusate (SENOKOT-S) 8.6-50 MG tablet Take 1 tablet by mouth at bedtime as needed for mild constipation. Patient not taking: Reported on 10/31/2019 10/05/19   Raiford Noble Latif, DO    Allergies No healthtouch food allergies  Family History  Problem Relation Age of Onset  . Heart failure Mother         a. onset in her 41s  . Diabetes Mother   . Hypertension Father   . Diabetes Father     Social History Social History   Tobacco Use  . Smoking status: Former Smoker    Packs/day: 0.50    Types: Cigarettes  . Smokeless tobacco: Never Used  Vaping Use  . Vaping Use: Never used  Substance Use Topics  . Alcohol use: Not Currently    Comment: ~ 4 shots/day on weekends only  . Drug use: Not Currently    Types: Cocaine    Comment: Last day of use was prior to June 2021 hosp.visit.     Review of Systems Constitutional: No fever/chills Eyes: No visual changes. ENT: No sore throat. Cardiovascular: Positive for chest pain. Respiratory: Positive for shortness of breath. Gastrointestinal: Positive for abdominal distention. Genitourinary: Negative for dysuria. Musculoskeletal: Positive for leg edema. Skin: Negative for rash. Neurological: Negative for headaches, focal weakness or numbness.  ____________________________________________   PHYSICAL EXAM:  VITAL SIGNS: ED Triage Vitals [05/19/20 1512]  Enc Vitals Group     BP (!) 143/108     Pulse Rate (!) 108     Resp 20     Temp 98.3 F (36.8 C)     Temp Source Oral     SpO2 99 %     Weight 200 lb (90.7 kg)     Height '5\' 7"'  (1.702 m)     Head Circumference      Peak Flow      Pain Score 6   Constitutional: Alert and oriented.  Eyes: Conjunctivae are normal.  ENT      Head: Normocephalic and atraumatic.      Nose: No congestion/rhinnorhea.      Mouth/Throat: Mucous membranes are moist.      Neck: No stridor. Hematological/Lymphatic/Immunilogical: No cervical lymphadenopathy. Cardiovascular: Tachycardia, regular rhythm.   Respiratory: slightly increased respiratory effort. Some lower lung crackles. Gastrointestinal: Soft and non tender. No rebound. No guarding.  Genitourinary: Deferred Musculoskeletal: Normal range of motion in all extremities. Positive for 1+ lower extremity edema. Neurologic:  Normal speech  and language. No gross focal neurologic deficits are appreciated.  Skin:  Skin is warm, dry and intact. No rash noted. Psychiatric: Mood and affect are normal. Speech and behavior are normal. Patient exhibits appropriate insight and judgment.  ____________________________________________    LABS (pertinent positives/negatives)  Trop hs 310-280 BMP wnl except cl 96, glu 220, ca 7.4 BNP 716.9 CBC wbc 4.7, hgb 10.6, plt 491  ____________________________________________   EKG  I, Nance Pear, attending physician, personally viewed and interpreted this EKG  EKG Time: 1506 Rate: 109 Rhythm: sinus tachycardia Axis: normal Intervals: qtc 471 QRS: narrow, q waves v1 ST changes: no st elevation Impression: abnormal ekg  ____________________________________________    RADIOLOGY  CXR Bilateral lung opacities  ____________________________________________   PROCEDURES  Procedures  ____________________________________________   INITIAL IMPRESSION / ASSESSMENT AND  PLAN / ED COURSE  Pertinent labs & imaging results that were available during my care of the patient were reviewed by me and considered in my medical decision making (see chart for details).   Patient presented to the emergency department today because of concerns for fluid overload and some shortness of breath.  On exam patient does have some mild increased respiratory effort.  Has peripheral edema.  Blood work here is consistent with fluid overload with elevated BNP.  Chest x-ray is consistent with pulmonary edema.  Blood work also showed elevated troponin.  EKG without any ST elevation and repeat troponin was improving.  Do think this could be related to fluid overload however is perhaps slightly higher than I would expect.  Will plan on giving patient IV Lasix.  Will plan on admission to the hospital.  Discussed findings plan with patient.  ____________________________________________   FINAL CLINICAL  IMPRESSION(S) / ED DIAGNOSES  Final diagnoses:  Acute on chronic congestive heart failure, unspecified heart failure type ([place])  Shortness of breath  Elevated troponin     Note: This dictation was prepared with Dragon dictation. Any transcriptional errors that result from this process are unintentional     Nance Pear, MD 05/19/20 2255

## 2020-05-19 NOTE — H&P (Addendum)
History and Physical    ZADAYA CUADRA ELF:[id number] DOB: [birth year] DOA: 05/19/2020  PCP: Patient, No Pcp Per  Patient coming from: Home  I have personally briefly reviewed patient's old medical records in [place]  Chief Complaint: Shortness of breath  HPI: Marilla L Isaac is a 35 y.o. female with medical history significant for combined systolic and diastolic heart failure (35 to 40% EF on 10/2019), uncontrolled insulin-dependent type II diabetes, chronic anemia, history of polysubstance abuse (cocaine) who presents with complaints of increasing shortness of breath.  For the past 3 weeks she has noted increasing shortness of breath especially on exertion.  Also has had PND, orthopnea, increasing lower extremity edema as well as abdominal distention.  She has not been able to afford any of her medication.  Started taking her mother's Lasix about 3 weeks ago which initially helped but discontinued after it did not seem to be doing anything.  Also notes occasional sharp midsternal chest pain when she wakes up and causes her to have a cough.  Last for about 5 minutes.  Thinks it goes away after she drinks water.  Denies any fever or sick contact.  Occasional nausea but no vomiting.  Patient's last admission for acute heart failure was back in June 2021.  ED Course: She was afebrile but tachycardic and hypertensive up to 143/108. BNP elevated to 716.  Initial troponin of 310 and down trended to 280.  No leukocytosis.  Had stable chronic anemia with hemoglobin of 10.6 with usual baseline around 9-10.  Thrombocytosis of 491 which is chronic as well. Glucose of 220.  Normal creatinine.  CXR shows stable cardiomegaly with pulmonary congestion.   Given IV 17m Lasix in ED.   Review of Systems:  Constitutional: + Weight Change, No Fever ENT/Mouth: No sore throat, No Rhinorrhea Eyes: No Eye Pain, No Vision Changes Cardiovascular: + Chest Pain,+ SOB, +PND, + Dyspnea on Exertion, +  Orthopnea, No Claudication, + Edema, + Palpitations Respiratory: + Cough, No Sputum, No Wheezing, + Dyspnea  Gastrointestinal: + Nausea, No Vomiting, No Diarrhea, No Constipation, No Pain Genitourinary: no Urinary Incontinence, No Urgency, No Flank Pain Musculoskeletal: No Arthralgias, No Myalgias Skin: No Skin Lesions, No Pruritus, Neuro: no Weakness, No Numbness,  No Loss of Consciousness, No Syncope Psych: No Anxiety/Panic, No Depression, no decrease appetite Heme/Lymph: No Bruising, No Bleeding  Past Medical History:  Diagnosis Date  . Acid reflux   . Diabetes [place]   . Obesity     Past Surgical History:  Procedure Laterality Date  . CHOLECYSTECTOMY    . HERNIA REPAIR       reports that she has quit smoking. Her smoking use included cigarettes. She smoked 0.50 packs per day. She has never used smokeless tobacco. She reports previous alcohol use. She reports previous drug use. Drug: Cocaine. Social History  Allergies  Allergen Reactions  . No Healthtouch Food Allergies Rash and Other (See Comments)    Lemons    Family History  Problem Relation Age of Onset  . Heart failure Mother        a. onset in her 444s . Diabetes Mother   . Hypertension Father   . Diabetes Father      Prior to Admission medications   Medication Sig Start Date End Date Taking? Authorizing Provider  acetaminophen (TYLENOL) 325 MG tablet Take 2 tablets (650 mg total) by mouth every 6 (six) hours as needed for mild pain (or Fever >/= 101). Patient not  taking: Reported on 10/31/2019 10/05/19   Raiford Noble Latif, DO  aspirin EC 81 MG EC tablet Take 1 tablet (81 mg total) by mouth daily. Patient not taking: Reported on 10/31/2019 10/06/19   Raiford Noble Latif, DO  blood glucose meter kit and supplies KIT Dispense based on patient and insurance preference. Use up to four times daily as directed. (FOR ICD-9 250.00, 250.01). Patient not taking: Reported on 10/31/2019 10/05/19   Raiford Noble Latif,  DO  carvedilol (COREG) 6.25 MG tablet Take 1 tablet (6.25 mg total) by mouth 2 (two) times daily with a meal. 10/31/19   Loel Dubonnet, NP  furosemide (LASIX) 40 MG tablet Take 1 tablet (40 mg total) by mouth daily. 10/31/19   Loel Dubonnet, NP  insulin isophane & regular human (NOVOLIN 70/30 FLEXPEN RELION) (70-30) 100 UNIT/ML KwikPen Inject 14 Units into the skin in the morning and at bedtime. Patient not taking: Reported on 10/31/2019 10/05/19   Raiford Noble Latif, DO  Insulin Pen Needle (RELION MINI PEN NEEDLES) 31G X 6 MM MISC 1 Container by Does not apply route in the morning and at bedtime. Patient not taking: Reported on 10/31/2019 10/05/19   Raiford Noble Latif, DO  lisinopril (ZESTRIL) 5 MG tablet Take 1 tablet (5 mg total) by mouth daily. Patient not taking: Reported on 10/31/2019 10/06/19   Raiford Noble Latif, DO  ondansetron (ZOFRAN) 4 MG tablet Take 1 tablet (4 mg total) by mouth every 6 (six) hours as needed for nausea. Patient not taking: Reported on 10/31/2019 10/05/19   Raiford Noble Latif, DO  senna-docusate (SENOKOT-S) 8.6-50 MG tablet Take 1 tablet by mouth at bedtime as needed for mild constipation. Patient not taking: Reported on 10/31/2019 10/05/19   Kerney Elbe, DO    Physical Exam: Vitals:   05/19/20 1512 05/19/20 1725 05/19/20 2301  BP: (!) 143/108 (!) 142/102 (!) 139/111  Pulse: (!) 108 (!) 102 (!) 108  Resp: '20 18 19  ' Temp: 98.3 F (36.8 C)    TempSrc: Oral    SpO2: 99% 100% 99%  Weight: 90.7 kg    Height: '5\' 7"'  (1.702 m)      Constitutional: NAD, calm, comfortable, obese young female laying at 30 degree incline in bed Vitals:   05/19/20 1512 05/19/20 1725 05/19/20 2301  BP: (!) 143/108 (!) 142/102 (!) 139/111  Pulse: (!) 108 (!) 102 (!) 108  Resp: '20 18 19  ' Temp: 98.3 F (36.8 C)    TempSrc: Oral    SpO2: 99% 100% 99%  Weight: 90.7 kg    Height: '5\' 7"'  (1.702 m)     Eyes: PERRL, lids and conjunctivae normal ENMT: Mucous membranes are moist. .   Neck: normal, supple, no masses Respiratory: clear to auscultation bilaterally, no wheezing, no crackles. Normal respiratory effort on ambient air. No accessory muscle use.  Cardiovascular: Regular rate and rhythm, no murmurs / rubs / gallops.  Significant 3+ edema of the lower extremities bilaterally with skin tightening up to abdomen with abdominal wall edema.   Abdomen: Abdominal distention with abdominal wall edema.  Bowel sounds positive.  Musculoskeletal: no clubbing / cyanosis. No joint deformity upper and lower extremities. Good ROM, no contractures. Normal muscle tone.  Skin: no rashes, lesions, ulcers. No induration Neurologic: CN 2-12 grossly intact. Sensation intact,  Strength 5/5 in all 4.  Psychiatric: Normal judgment and insight. Alert and oriented x 3. Normal mood.     Labs on Admission: I have personally reviewed following labs  and imaging studies  CBC: Recent Labs  Lab 05/19/20 1516  WBC 4.7  HGB 10.6*  HCT 35.4*  MCV 84.3  PLT 073*   Basic Metabolic Panel: Recent Labs  Lab 05/19/20 1516  NA 137  K 3.7  CL 96*  CO2 28  GLUCOSE 220*  BUN 11  CREATININE 0.98  CALCIUM 7.4*   GFR: Estimated Creatinine Clearance: 92.6 mL/min (by C-G formula based on SCr of 0.98 mg/dL). Liver Function Tests: No results for input(s): AST, ALT, ALKPHOS, BILITOT, PROT, ALBUMIN in the last 168 hours. No results for input(s): LIPASE, AMYLASE in the last 168 hours. No results for input(s): AMMONIA in the last 168 hours. Coagulation Profile: No results for input(s): INR, PROTIME in the last 168 hours. Cardiac Enzymes: No results for input(s): CKTOTAL, CKMB, CKMBINDEX, TROPONINI in the last 168 hours. BNP (last 3 results) No results for input(s): PROBNP in the last 8760 hours. HbA1C: No results for input(s): HGBA1C in the last 72 hours. CBG: No results for input(s): GLUCAP in the last 168 hours. Lipid Profile: No results for input(s): CHOL, HDL, LDLCALC, TRIG, CHOLHDL,  LDLDIRECT in the last 72 hours. Thyroid Function Tests: No results for input(s): TSH, T4TOTAL, FREET4, T3FREE, THYROIDAB in the last 72 hours. Anemia Panel: No results for input(s): VITAMINB12, FOLATE, FERRITIN, TIBC, IRON, RETICCTPCT in the last 72 hours. Urine analysis: No results found for: COLORURINE, APPEARANCEUR, LABSPEC, [place], GLUCOSEU, HGBUR, BILIRUBINUR, KETONESUR, PROTEINUR, UROBILINOGEN, NITRITE, LEUKOCYTESUR  Radiological Exams on Admission: DG Chest 2 View  Result Date: 05/19/2020 CLINICAL DATA:  Shortness of breath. EXAM: CHEST - 2 VIEW COMPARISON:  October 05, 2019. FINDINGS: Stable cardiomegaly with central pulmonary vascular congestion. Bilateral lung opacities are noted which may represent pulmonary edema. No pneumothorax or pleural effusion is noted. Bony thorax is unremarkable. IMPRESSION: Stable cardiomegaly with central pulmonary vascular congestion. Bilateral lung opacities are noted which may represent pulmonary edema. Electronically Signed   By: Marijo Conception M.D.   On: 05/19/2020 15:34      Assessment/Plan  COVID-19 infection Incidental finding.  Will start on remdesivir due to high risk comorbidities. Monitor inflammatory markers: CRP, fibrin derivative  Acute on chronic combined systolic and diastolic heart failure Secondary to inability to afford medication Last admission for the same in June 2021 with EF of 35 to 40% and grade 2 diastolic dysfunction Repeat echocardiogram Strict intake and output, daily weights-weight at discharge back in June was 105.5 kg Daily IV 40 mg Lasix  Elevated troponin secondary to demand ischemia from acute CHF Has been downward trending from 310 280.  Will obtain a few more to watch trend   Hypertension uncontrolled monitor on IV lasix will need to resume beta-blocker once compensated   Uncontrolled dependent type 2 diabetes Has not been on insulin due to financial issues. Last hemoglobin A1c in June 2021 at 12.1-will  repeat Glucose at 220 on admit -start moderate sliding scale  Chronic anemia  baseline 9-10 Stable-currently at 10.6  Has been recommended to follow up with hematology and gynecology in the past Check iron panel, vitamin B12, folate  Thrombocytosis Chronic and stable Suspect reactive due to chronic comorbidities  History of polysubstance abuse Patient states that she is quit  Check UDS  Obesity BMI greater than 30  DVT prophylaxis:.Lovenox Code Status: Full Family Communication: Plan discussed with patient at bedside  disposition Plan: Home with at least 2 midnight stays  Consults called:  Admission status: inpatient        Status  is: Inpatient  Remains inpatient appropriate because:IV treatments appropriate due to intensity of illness or inability to take PO   Dispo: The patient is from: Home              Anticipated d/c is to: Home              Anticipated d/c date is: > 3 days              Patient currently is not medically stable to d/c.         Orene Desanctis DO [facility]   If 7PM-7AM, please contact night-coverage [email or web address]   05/19/2020, 11:55 PM

## 2020-05-19 NOTE — ED Notes (Signed)
Date and time results received: 05/19/20 1602 (use smartphrase ".now" to insert current time)  Test: Trop  Critical Value: 310  Name of Provider Notified: Dr. Adaline Sill  Orders Received? Or Actions Taken?: See orders

## 2020-05-19 NOTE — H&P (Incomplete)
History and Physical    Molly Howard ZOX:[id number] DOB: 1985-11-16 DOA: 05/19/2020  PCP: Patient, No Pcp Per  Patient coming from: Home  I have personally briefly reviewed patient's old medical records in [place]  Chief Complaint: Shortness of breath  HPI: Molly Howard is a 35 y.o. female with medical history significant for combined systolic and diastolic heart failure (35 to 40% EF on 10/2019), uncontrolled insulin-dependent type II diabetes, chronic anemia, history of polysubstance abuse (cocaine) who presents with complaints of increasing shortness of breath.  For the past 3 weeks she has noted increasing shortness of breath especially on exertion.  Also has had PND, orthopnea, increasing lower extremity edema as well as abdominal distention.  She has not been able to afford any of her medication.  Started taking her mother's Lasix about 3 weeks ago which initially helped but discontinued after it did not seem to be doing anything.  Also notes occasional sharp midsternal chest pain when she wakes up and causes her to have a cough.  Last for about 5 minutes.  Thinks it goes away after she drinks water.  Denies any fever or sick contact.  Occasional nausea but no vomiting.  Patient's last admission for acute heart failure was back in June 2021.  ED Course: She was afebrile but tachycardic and hypertensive up to 143/108. BNP elevated to 716.  Initial troponin of 310 and down trended to 280.  No leukocytosis.  Had stable chronic anemia with hemoglobin of 10.6 with usual baseline around 9-10.  Thrombocytosis of 491 which is chronic as well. Glucose of 220.  Normal creatinine.  Review of Systems:  Constitutional: + Weight Change, No Fever ENT/Mouth: No sore throat, No Rhinorrhea Eyes: No Eye Pain, No Vision Changes Cardiovascular: + Chest Pain,+ SOB, +PND, + Dyspnea on Exertion, + Orthopnea, No Claudication, + Edema, + Palpitations Respiratory: + Cough, No Sputum, No Wheezing, +  Dyspnea  Gastrointestinal: + Nausea, No Vomiting, No Diarrhea, No Constipation, No Pain Genitourinary: no Urinary Incontinence, No Urgency, No Flank Pain Musculoskeletal: No Arthralgias, No Myalgias Skin: No Skin Lesions, No Pruritus, Neuro: no Weakness, No Numbness,  No Loss of Consciousness, No Syncope Psych: No Anxiety/Panic, No Depression, no decrease appetite Heme/Lymph: No Bruising, No Bleeding  Past Medical History:  Diagnosis Date  . Acid reflux   . Diabetes mellitus ([place])   . Obesity     Past Surgical History:  Procedure Laterality Date  . CHOLECYSTECTOMY    . HERNIA REPAIR       reports that she has quit smoking. Her smoking use included cigarettes. She smoked 0.50 packs per day. She has never used smokeless tobacco. She reports previous alcohol use. She reports previous drug use. Drug: Cocaine. Social History  Allergies  Allergen Reactions  . No Healtht[employer] Food Allergies Rash and Other (See Comments)    Lemons    Family History  Problem Relation Age of Onset  . Heart failure Mother        a. onset in her 25s  . Diabetes Mother   . Hypertension Father   . Diabetes Father      Prior to Admission medications   Medication Sig Start Date End Date Taking? Authorizing Provider  acetaminophen (TYLENOL) 325 MG tablet Take 2 tablets (650 mg total) by mouth every 6 (six) hours as needed for mild pain (or Fever >/= 101). Patient not taking: Reported on 10/31/2019 10/05/19   Raiford Noble Latif, DO  aspirin EC 81 MG EC  tablet Take 1 tablet (81 mg total) by mouth daily. Patient not taking: Reported on 10/31/2019 10/06/19   Raiford Noble Latif, DO  blood glucose meter kit and supplies KIT Dispense based on patient and insurance preference. Use up to four times daily as directed. (FOR ICD-9 250.00, 250.01). Patient not taking: Reported on 10/31/2019 10/05/19   Raiford Noble Latif, DO  carvedilol (COREG) 6.25 MG tablet Take 1 tablet (6.25 mg total) by mouth 2 (two) times daily with  a meal. 10/31/19   Loel Dubonnet, NP  furosemide (LASIX) 40 MG tablet Take 1 tablet (40 mg total) by mouth daily. 10/31/19   Loel Dubonnet, NP  insulin isophane & regular human (NOVOLIN 70/30 FLEXPEN RELION) (70-30) 100 UNIT/ML KwikPen Inject 14 Units into the skin in the morning and at bedtime. Patient not taking: Reported on 10/31/2019 10/05/19   Raiford Noble Latif, DO  Insulin Pen Needle (RELION MINI PEN NEEDLES) 31G X 6 MM MISC 1 Container by Does not apply route in the morning and at bedtime. Patient not taking: Reported on 10/31/2019 10/05/19   Raiford Noble Latif, DO  lisinopril (ZESTRIL) 5 MG tablet Take 1 tablet (5 mg total) by mouth daily. Patient not taking: Reported on 10/31/2019 10/06/19   Raiford Noble Latif, DO  ondansetron (ZOFRAN) 4 MG tablet Take 1 tablet (4 mg total) by mouth every 6 (six) hours as needed for nausea. Patient not taking: Reported on 10/31/2019 10/05/19   Raiford Noble Latif, DO  senna-docusate (SENOKOT-S) 8.6-50 MG tablet Take 1 tablet by mouth at bedtime as needed for mild constipation. Patient not taking: Reported on 10/31/2019 10/05/19   Kerney Elbe, DO    Physical Exam: Vitals:   05/19/20 1512 05/19/20 1725 05/19/20 2301  BP: (!) 143/108 (!) 142/102 (!) 139/111  Pulse: (!) 108 (!) 102 (!) 108  Resp: '20 18 19  ' Temp: 98.3 F (36.8 C)    TempSrc: Oral    SpO2: 99% 100% 99%  Weight: 90.7 kg    Height: '5\' 7"'  (1.702 m)      Constitutional: NAD, calm, comfortable Vitals:   05/19/20 1512 05/19/20 1725 05/19/20 2301  BP: (!) 143/108 (!) 142/102 (!) 139/111  Pulse: (!) 108 (!) 102 (!) 108  Resp: '20 18 19  ' Temp: 98.3 F (36.8 C)    TempSrc: Oral    SpO2: 99% 100% 99%  Weight: 90.7 kg    Height: '5\' 7"'  (1.702 m)     Eyes: PERRL, lids and conjunctivae normal ENMT: Mucous membranes are moist. Posterior pharynx clear of any exudate or lesions.Normal dentition.  Neck: normal, supple, no masses, no thyromegaly Respiratory: clear to auscultation  bilaterally, no wheezing, no crackles. Normal respiratory effort. No accessory muscle use.  Cardiovascular: Regular rate and rhythm, no murmurs / rubs / gallops. No extremity edema. 2+ pedal pulses. No carotid bruits.  Abdomen: no tenderness, no masses palpated. No hepatosplenomegaly. Bowel sounds positive.  Musculoskeletal: no clubbing / cyanosis. No joint deformity upper and lower extremities. Good ROM, no contractures. Normal muscle tone.  Skin: no rashes, lesions, ulcers. No induration Neurologic: CN 2-12 grossly intact. Sensation intact, DTR normal. Strength 5/5 in all 4.  Psychiatric: Normal judgment and insight. Alert and oriented x 3. Normal mood.   (Anything < 9 systems with 2 bullets each down codes to level 1) (If patient refuses exam can't bill higher level) (Make sure to document decubitus ulcers present on admission -- if possible -- and whether patient has chronic indwelling catheter  at time of admission)  Labs on Admission: I have personally reviewed following labs and imaging studies  CBC: Recent Labs  Lab 05/19/20 1516  WBC 4.7  HGB 10.6*  HCT 35.4*  MCV 84.3  PLT 828*   Basic Metabolic Panel: Recent Labs  Lab 05/19/20 1516  NA 137  K 3.7  CL 96*  CO2 28  GLUCOSE 220*  BUN 11  CREATININE 0.98  CALCIUM 7.4*   GFR: Estimated Creatinine Clearance: 92.6 mL/min (by C-G formula based on SCr of 0.98 mg/dL). Liver Function Tests: No results for input(s): AST, ALT, ALKPHOS, BILITOT, PROT, ALBUMIN in the last 168 hours. No results for input(s): LIPASE, AMYLASE in the last 168 hours. No results for input(s): AMMONIA in the last 168 hours. Coagulation Profile: No results for input(s): INR, PROTIME in the last 168 hours. Cardiac Enzymes: No results for input(s): CKTOTAL, CKMB, CKMBINDEX, TROPONINI in the last 168 hours. BNP (last 3 results) No results for input(s): PROBNP in the last 8760 hours. HbA1C: No results for input(s): HGBA1C in the last 72  hours. CBG: No results for input(s): GLUCAP in the last 168 hours. Lipid Profile: No results for input(s): CHOL, HDL, LDLCALC, TRIG, CHOLHDL, LDLDIRECT in the last 72 hours. Thyroid Function Tests: No results for input(s): TSH, T4TOTAL, FREET4, T3FREE, THYROIDAB in the last 72 hours. Anemia Panel: No results for input(s): VITAMINB12, FOLATE, FERRITIN, TIBC, IRON, RETICCTPCT in the last 72 hours. Urine analysis: No results found for: COLORURINE, APPEARANCEUR, LABSPEC, [place], GLUCOSEU, HGBUR, BILIRUBINUR, KETONESUR, PROTEINUR, UROBILINOGEN, NITRITE, LEUKOCYTESUR  Radiological Exams on Admission: DG Chest 2 View  Result Date: 05/19/2020 CLINICAL DATA:  Shortness of breath. EXAM: CHEST - 2 VIEW COMPARISON:  October 05, 2019. FINDINGS: Stable cardiomegaly with central pulmonary vascular congestion. Bilateral lung opacities are noted which may represent pulmonary edema. No pneumothorax or pleural effusion is noted. Bony thorax is unremarkable. IMPRESSION: Stable cardiomegaly with central pulmonary vascular congestion. Bilateral lung opacities are noted which may represent pulmonary edema. Electronically Signed   By: Marijo Conception M.D.   On: 05/19/2020 15:34      Assessment/Plan   Active Problems:   CHF (congestive heart failure) ([place])  (please populate well all problems here in Problem List. (For example, if patient is on BP meds at home and you resume or decide to hold them, it is a problem that needs to be her. Same for CAD, COPD, HLD and so on)   ***  DVT prophylaxis: *** (Lovenox/Heparin/SCD's/anticoagulated/None (if comfort care) Code Status: *** (Full/Partial (specify details) Family Communication: *** (Specify name, relationship. Do not write "discussed with patient". Specify tel # if discussed over the phone) Disposition Plan: *** (specify when and where you expect patient to be discharged) Consults called: *** (with names) Admission status: *** (inpatient / obs / tele / medical  floor / SDU)  Status is: Inpatient  {Inpatient:23812}  Dispo: The patient is from: {From:23814}              Anticipated d/c is to: {To:23815}              Anticipated d/c date is: {MKLK:91791}              Patient currently {Medically stable:23817}         Orene Desanctis DO [facility]   If 7PM-7AM, please contact night-coverage [email or web address]   05/19/2020, 11:55 PM

## 2020-05-20 ENCOUNTER — Inpatient Hospital Stay (HOSPITAL_COMMUNITY)
Admit: 2020-05-20 | Discharge: 2020-05-20 | Disposition: A | Discharge status: Home / Self Care | Payer: Self-pay | Attending: Family Medicine | Admitting: Family Medicine

## 2020-05-20 DIAGNOSIS — I10 Essential (primary) hypertension: Secondary | ICD-10-CM

## 2020-05-20 DIAGNOSIS — E119 Type 2 diabetes mellitus without complications: Secondary | ICD-10-CM

## 2020-05-20 DIAGNOSIS — E669 Obesity, unspecified: Secondary | ICD-10-CM

## 2020-05-20 DIAGNOSIS — I5021 Acute systolic (congestive) heart failure: Secondary | ICD-10-CM

## 2020-05-20 DIAGNOSIS — E1165 Type 2 diabetes mellitus with hyperglycemia: Secondary | ICD-10-CM

## 2020-05-20 DIAGNOSIS — D649 Anemia, unspecified: Secondary | ICD-10-CM

## 2020-05-20 DIAGNOSIS — R778 Other specified abnormalities of plasma proteins: Secondary | ICD-10-CM

## 2020-05-20 DIAGNOSIS — R7989 Other specified abnormal findings of blood chemistry: Secondary | ICD-10-CM

## 2020-05-20 DIAGNOSIS — I5043 Acute on chronic combined systolic (congestive) and diastolic (congestive) heart failure: Secondary | ICD-10-CM | POA: Diagnosis present

## 2020-05-20 DIAGNOSIS — D75838 Other thrombocytosis: Secondary | ICD-10-CM

## 2020-05-20 DIAGNOSIS — D75839 Thrombocytosis, unspecified: Secondary | ICD-10-CM

## 2020-05-20 LAB — CBC
HCT: 34.5 % — ABNORMAL LOW (ref 36.0–46.0)
Hemoglobin: 10.4 g/dL — ABNORMAL LOW (ref 12.0–15.0)
MCH: 25.4 pg — ABNORMAL LOW (ref 26.0–34.0)
MCHC: 30.1 g/dL (ref 30.0–36.0)
MCV: 84.1 fL (ref 80.0–100.0)
Platelets: 444 10*3/uL — ABNORMAL HIGH (ref 150–400)
RBC: 4.1 MIL/uL (ref 3.87–5.11)
RDW: 15.9 % — ABNORMAL HIGH (ref 11.5–15.5)
WBC: 3.8 10*3/uL — ABNORMAL LOW (ref 4.0–10.5)
nRBC: 0 % (ref 0.0–0.2)

## 2020-05-20 LAB — ECHOCARDIOGRAM COMPLETE
AR max vel: 1.84 cm2
AV Area VTI: 2.79 cm2
AV Area mean vel: 1.7 cm2
AV Mean grad: 3 mmHg
AV Peak grad: 5.7 mmHg
Ao pk vel: 1.19 m/s
Area-P 1/2: 8.52 cm2
Calc EF: 29.1 %
Height: 67 in
S' Lateral: 4.5 cm
Single Plane A2C EF: 37.5 %
Single Plane A4C EF: 29.4 %
Weight: 3200 oz

## 2020-05-20 LAB — BASIC METABOLIC PANEL
Anion gap: 9 (ref 5–15)
BUN: 10 mg/dL (ref 6–20)
CO2: 28 mmol/L (ref 22–32)
Calcium: 7 mg/dL — ABNORMAL LOW (ref 8.9–10.3)
Chloride: 102 mmol/L (ref 98–111)
Creatinine, Ser: 0.86 mg/dL (ref 0.44–1.00)
GFR, Estimated: >60 mL/min (ref >60)
Glucose, Bld: 183 mg/dL — ABNORMAL HIGH (ref 70–99)
Potassium: 3.6 mmol/L (ref 3.5–5.1)
Sodium: 139 mmol/L (ref 135–145)

## 2020-05-20 LAB — CBG MONITORING, ED
Glucose-Capillary: 171 mg/dL — ABNORMAL HIGH (ref 70–99)
Glucose-Capillary: 184 mg/dL — ABNORMAL HIGH (ref 70–99)
Glucose-Capillary: 190 mg/dL — ABNORMAL HIGH (ref 70–99)
Glucose-Capillary: 249 mg/dL — ABNORMAL HIGH (ref 70–99)
Glucose-Capillary: 314 mg/dL — ABNORMAL HIGH (ref 70–99)
Glucose-Capillary: 336 mg/dL — ABNORMAL HIGH (ref 70–99)

## 2020-05-20 LAB — CBC WITH DIFFERENTIAL/PLATELET
Abs Immature Granulocytes: 0.03 10*3/uL (ref 0.00–0.07)
Basophils Absolute: 0 10*3/uL (ref 0.0–0.1)
Basophils Relative: 1 %
Eosinophils Absolute: 0.1 10*3/uL (ref 0.0–0.5)
Eosinophils Relative: 1 %
HCT: 34 % — ABNORMAL LOW (ref 36.0–46.0)
Hemoglobin: 9.9 g/dL — ABNORMAL LOW (ref 12.0–15.0)
Immature Granulocytes: 1 %
Lymphocytes Relative: 24 %
Lymphs Abs: 1.1 10*3/uL (ref 0.7–4.0)
MCH: 24.6 pg — ABNORMAL LOW (ref 26.0–34.0)
MCHC: 29.1 g/dL — ABNORMAL LOW (ref 30.0–36.0)
MCV: 84.6 fL (ref 80.0–100.0)
Monocytes Absolute: 0.3 10*3/uL (ref 0.1–1.0)
Monocytes Relative: 6 %
Neutro Abs: 3 10*3/uL (ref 1.7–7.7)
Neutrophils Relative %: 67 %
Platelets: 462 10*3/uL — ABNORMAL HIGH (ref 150–400)
RBC: 4.02 MIL/uL (ref 3.87–5.11)
RDW: 15.7 % — ABNORMAL HIGH (ref 11.5–15.5)
WBC: 4.4 10*3/uL (ref 4.0–10.5)
nRBC: 0 % (ref 0.0–0.2)

## 2020-05-20 LAB — FOLATE: Folate: 7.8 ng/mL (ref >5.9)

## 2020-05-20 LAB — TROPONIN I (HIGH SENSITIVITY)
Troponin I (High Sensitivity): 287 ng/L (ref <18)
Troponin I (High Sensitivity): 305 ng/L (ref <18)

## 2020-05-20 LAB — URINE DRUG SCREEN, QUALITATIVE (ARMC ONLY)
Amphetamines, Ur Screen: NOT DETECTED
Barbiturates, Ur Screen: NOT DETECTED
Benzodiazepine, Ur Scrn: NOT DETECTED
Cannabinoid 50 Ng, Ur ~~LOC~~: NOT DETECTED
Cocaine Metabolite,Ur ~~LOC~~: POSITIVE — AB
MDMA (Ecstasy)Ur Screen: NOT DETECTED
Methadone Scn, Ur: NOT DETECTED
Opiate, Ur Screen: NOT DETECTED
Phencyclidine (PCP) Ur S: NOT DETECTED
Tricyclic, Ur Screen: NOT DETECTED

## 2020-05-20 LAB — RESP PANEL BY RT-PCR (FLU A&B, COVID) ARPGX2
Influenza A by PCR: NEGATIVE
Influenza B by PCR: NEGATIVE
SARS Coronavirus 2 by RT PCR: POSITIVE — AB

## 2020-05-20 LAB — FIBRINOGEN: Fibrinogen: 607 mg/dL — ABNORMAL HIGH (ref 210–475)

## 2020-05-20 LAB — VITAMIN B12: Vitamin B-12: 292 pg/mL (ref 180–914)

## 2020-05-20 LAB — HEMOGLOBIN A1C
Hgb A1c MFr Bld: 11.8 % — ABNORMAL HIGH (ref 4.8–5.6)
Mean Plasma Glucose: 291.96 mg/dL

## 2020-05-20 LAB — FIBRIN DERIVATIVES D-DIMER (ARMC ONLY): Fibrin derivatives D-dimer (ARMC): 1465.68 ng/mL (FEU) — ABNORMAL HIGH (ref 0.00–499.00)

## 2020-05-20 LAB — IRON AND TIBC
Iron: 18 ug/dL — ABNORMAL LOW (ref 28–170)
Saturation Ratios: 9 % — ABNORMAL LOW (ref 10.4–31.8)
TIBC: 206 ug/dL — ABNORMAL LOW (ref 250–450)
UIBC: 188 ug/dL

## 2020-05-20 LAB — POC URINE PREG, ED: Preg Test, Ur: NEGATIVE

## 2020-05-20 LAB — C-REACTIVE PROTEIN: CRP: 1.4 mg/dL — ABNORMAL HIGH (ref <1.0)

## 2020-05-20 LAB — FERRITIN: Ferritin: 7 ng/mL — ABNORMAL LOW (ref 11–307)

## 2020-05-20 LAB — PROCALCITONIN: Procalcitonin: <0.1 ng/mL

## 2020-05-20 MED ORDER — METHYLPREDNISOLONE SODIUM SUCC 125 MG IJ SOLR: 0.5000 mg/kg | Freq: Two times a day (BID) | INTRAMUSCULAR | Status: DC

## 2020-05-20 MED ORDER — ASCORBIC ACID 500 MG PO TABS
500.0000 mg | ORAL_TABLET | Freq: Every day | ORAL | Status: DC
  Administered 2020-05-20 – 2020-05-22 (×3): 500 mg via ORAL
  Filled 2020-05-20 (×3): qty 1

## 2020-05-20 MED ORDER — SODIUM CHLORIDE 0.9 % IV SOLN
100.0000 mg | Freq: Every day | INTRAVENOUS | Status: DC
  Filled 2020-05-20: qty 20

## 2020-05-20 MED ORDER — LISINOPRIL 10 MG PO TABS
5.0000 mg | ORAL_TABLET | Freq: Every day | ORAL | Status: DC
  Administered 2020-05-20 – 2020-05-22 (×3): 5 mg via ORAL
  Filled 2020-05-20 (×3): qty 1

## 2020-05-20 MED ORDER — PREDNISONE 20 MG PO TABS
50.0000 mg | ORAL_TABLET | Freq: Every day | ORAL | Status: DC
  Administered 2020-05-20: 50 mg via ORAL
  Filled 2020-05-20 (×2): qty 3

## 2020-05-20 MED ORDER — ASPIRIN EC 81 MG PO TBEC
81.0000 mg | DELAYED_RELEASE_TABLET | Freq: Every day | ORAL | Status: DC
  Administered 2020-05-20 – 2020-05-22 (×3): 81 mg via ORAL
  Filled 2020-05-20 (×3): qty 1

## 2020-05-20 MED ORDER — PREDNISONE 20 MG PO TABS: 50.0000 mg | ORAL_TABLET | Freq: Every day | ORAL | Status: DC

## 2020-05-20 MED ORDER — SODIUM CHLORIDE 0.9 % IV SOLN
200.0000 mg | Freq: Once | INTRAVENOUS | Status: AC
  Administered 2020-05-20: 200 mg via INTRAVENOUS
  Filled 2020-05-20: qty 200

## 2020-05-20 MED ORDER — ZINC SULFATE 220 (50 ZN) MG PO CAPS
220.0000 mg | ORAL_CAPSULE | Freq: Every day | ORAL | Status: DC
  Administered 2020-05-20 – 2020-05-22 (×3): 220 mg via ORAL
  Filled 2020-05-20 (×3): qty 1

## 2020-05-20 MED ORDER — ATORVASTATIN CALCIUM 20 MG PO TABS
20.0000 mg | ORAL_TABLET | Freq: Every day | ORAL | Status: DC
  Administered 2020-05-20 – 2020-05-22 (×3): 20 mg via ORAL
  Filled 2020-05-20 (×3): qty 1

## 2020-05-20 MED ORDER — CARVEDILOL 6.25 MG PO TABS
6.2500 mg | ORAL_TABLET | Freq: Two times a day (BID) | ORAL | Status: DC
  Administered 2020-05-20 – 2020-05-22 (×4): 6.25 mg via ORAL
  Filled 2020-05-20 (×4): qty 1

## 2020-05-20 NOTE — Progress Notes (Signed)
Remdesivir - Pharmacy Brief Note   A/P:  Remdesivir 200 mg IVPB once followed by 100 mg IVPB daily x 4 days.   Deziray Nabi, PharmD Clinical Pharmacist  

## 2020-05-20 NOTE — ED Notes (Signed)
Date and time results received: 05/20/20 0027 (use smartphrase ".now" to insert current time)  Test: Covid Critical Value: Positve  Name of Provider Notified: Jon Billings, NP

## 2020-05-20 NOTE — Progress Notes (Signed)
*  PRELIMINARY RESULTS* Echocardiogram 2D Echocardiogram has been performed.  Cristela Blue 05/20/2020, 10:32 AM

## 2020-05-20 NOTE — ED Notes (Signed)
Lab called collect morning labs, stated they are aware.  

## 2020-05-20 NOTE — ED Notes (Signed)
Pt ambulated with steady gait to hall bathroom to void. Pt back in room.

## 2020-05-20 NOTE — ED Notes (Addendum)
Charge RN made aware of pt being Covid+. Pt instructed to wear mask at all times. No rooms available to move pts to at this time.

## 2020-05-20 NOTE — ED Notes (Signed)
CBG 249. Last CBG at 1700 did not transfer to ED narrator so CBG was rechecked.

## 2020-05-20 NOTE — ED Notes (Signed)
Date and time results received: 05/20/20  0155 (use smartphrase ".now" to insert current time)  Test: D-Dimer Critical Value: 1465.68  Name of Provider Notified: Jon Billings, NP

## 2020-05-20 NOTE — Progress Notes (Signed)
PROGRESS NOTE    Molly Howard  CBU:[id number] DOB: 1985-08-07 DOA: 05/19/2020 PCP: Patient, No Pcp Per   Brief Narrative: Taken from H&P. Molly Howard is a 35 y.o. female with medical history significant for combined systolic and diastolic heart failure (35 to 03% EF on 10/2019), uncontrolled insulin-dependent type II diabetes, chronic anemia, history of polysubstance abuse (cocaine) who presents with complaints of increasing shortness of breath.  For the past 3 weeks she has noted increasing shortness of breath especially on exertion.  Also has had PND, orthopnea, increasing lower extremity edema as well as abdominal distention.  She has not been able to afford any of her medication.  Started taking her mother's Lasix about 3 weeks ago which initially helped but discontinued after it did not seem to be doing anything.  COVID-19 came back positive.  Elevated BNP, procalcitonin at less than 0.10 Chest x-ray with bilateral infiltrate and pulmonary vascular congestion.  Subjective: Patient did have some improvement in her dyspnea. She was getting echo when seen today. She stopped taking cocaine, per patient last cocaine use was in summer, UDS came back positive for cocaine. She was not taking her medications due to financial reason.  Assessment & Plan:   Principal Problem:   Chronic combined systolic and diastolic heart failure (HCC) Active Problems:   Elevated troponin   Essential hypertension   Uncontrolled diabetes mellitus (HCC)   Anemia   Thrombocytosis   Obesity (BMI 30-39.9)   Acute on chronic combined systolic and diastolic heart failure (HCC)  [id number] pneumonia. Currently saturating in low 90s on room air, elevated inflammatory markers, PCT less than 0.10. High risk with multiple comorbidities and cocaine use. She was started on remdesivir and steroid-day 1 Continue with supportive care and supplements.  Acute on chronic HFrEF.  EF of 35 to 40% with grade 2 diastolic  dysfunction. Repeat echocardiogram done-pending results. Lower extremity edema. Chest appears clear. -Continue with IV diuresis. -Strict intake and output -Daily weight and BMP  Essential hypertension. Blood pressure elevated. Continue with Lasix. Lisinopril was listed on her home meds but she was not taking it-restarting it. -Continue to monitor  Uncontrolled diabetes mellitus with hyperglycemia. -Check A1c -Continue with sliding scale.  Chronic anemia. Currently stable. Anemia panel with low iron saturation but TIBC is also low more consistent with anemia of chronic disease. B12 within lower normal limit. -We will need outpatient hematology evaluation.  Chronic thrombocytosis. Stable -We will need outpatient hematology evaluation.  History of polysubstance abuse. Patient denies any recent use but UDS is positive for cocaine. -We will need extensive counseling.  Stage II obesity. Body mass index is 31.32 kg/m.  Objective: Vitals:   05/19/20 2301 05/20/20 0048 05/20/20 0715 05/20/20 0730  BP: (!) 139/111 (!) 150/109  (!) 130/100  Pulse: (!) 108 (!) 105 98 (!) 103  Resp: 19 (!) 24 19 (!) 24  Temp:      TempSrc:      SpO2: 99% 98% 96% 91%  Weight:      Height:        Intake/Output Summary (Last 24 hours) at 05/20/2020 0900 Last data filed at 05/20/2020 0242 Gross per 24 hour  Intake 250 ml  Output --  Net 250 ml   Filed Weights   05/19/20 1512  Weight: 90.7 kg    Examination:  General exam: Appears calm and comfortable  Respiratory system: Clear to auscultation. Respiratory effort normal. Cardiovascular system: S1 & S2 heard, RRR.  Gastrointestinal system: Soft, nontender,  nondistended, bowel sounds positive. Central nervous system: Alert and oriented. No focal neurological deficits.Symmetric 5 x 5 power. Extremities: No edema, no cyanosis, pulses intact and symmetrical. Psychiatry: Judgement and insight appear normal.    DVT prophylaxis: Lovenox Code  Status: Full Family Communication: Discussed with patient Disposition Plan:  Status is: Inpatient  Remains inpatient appropriate because:Inpatient level of care appropriate due to severity of illness   Dispo: The patient is from: Home              Anticipated d/c is to: Home              Anticipated d/c date is: 2 days              Patient currently is not medically stable to d/c.    Consultants:   None  Procedures:  Antimicrobials:   Data Reviewed: I have personally reviewed following labs and imaging studies  CBC: Recent Labs  Lab 05/19/20 1516 05/20/20 0042 05/20/20 0757  WBC 4.7 4.4 3.8*  NEUTROABS  --  3.0  --   HGB 10.6* 9.9* 10.4*  HCT 35.4* 34.0* 34.5*  MCV 84.3 84.6 84.1  PLT 491* 462* 444*   Basic Metabolic Panel: Recent Labs  Lab 05/19/20 1516 05/20/20 0757  NA 137 139  K 3.7 3.6  CL 96* 102  CO2 28 28  GLUCOSE 220* 183*  BUN 11 10  CREATININE 0.98 0.86  CALCIUM 7.4* 7.0*   GFR: Estimated Creatinine Clearance: 105.5 mL/min (by C-G formula based on SCr of 0.86 mg/dL). Liver Function Tests: No results for input(s): AST, ALT, ALKPHOS, BILITOT, PROT, ALBUMIN in the last 168 hours. No results for input(s): LIPASE, AMYLASE in the last 168 hours. No results for input(s): AMMONIA in the last 168 hours. Coagulation Profile: No results for input(s): INR, PROTIME in the last 168 hours. Cardiac Enzymes: No results for input(s): CKTOTAL, CKMB, CKMBINDEX, TROPONINI in the last 168 hours. BNP (last 3 results) No results for input(s): PROBNP in the last 8760 hours. HbA1C: No results for input(s): HGBA1C in the last 72 hours. CBG: Recent Labs  Lab 05/20/20 0035 05/20/20 0730  GLUCAP 314* 171*   Lipid Profile: No results for input(s): CHOL, HDL, LDLCALC, TRIG, CHOLHDL, LDLDIRECT in the last 72 hours. Thyroid Function Tests: No results for input(s): TSH, T4TOTAL, FREET4, T3FREE, THYROIDAB in the last 72 hours. Anemia Panel: Recent Labs     05/20/20 0042 05/20/20 0757  FOLATE  --  7.8  FERRITIN 7*  --   TIBC  --  206*  IRON  --  18*   Sepsis Labs: Recent Labs  Lab 05/20/20 0042  PROCALCITON <0.10    Recent Results (from the past 240 hour(s))  Resp Panel by RT-PCR (Flu A&B, Covid) Nasopharyngeal Swab     Status: Abnormal   Collection Time: 05/19/20 10:42 PM   Specimen: Nasopharyngeal Swab; Nasopharyngeal(NP) swabs in vial transport medium  Result Value Ref Range Status   SARS Coronavirus 2 by RT PCR POSITIVE (A) NEGATIVE Final    Comment: RESULT CALLED TO, READ BACK BY AND VERIFIED WITH: JENNIFER AGNEW @0003  05/20/20 RH (NOTE) SARS-CoV-2 target nucleic acids are DETECTED.  The SARS-CoV-2 RNA is generally detectable in upper respiratory specimens during the acute phase of infection. Positive results are indicative of the presence of the identified virus, but do not rule out bacterial infection or co-infection with other pathogens not detected by the test. Clinical correlation with patient history and other diagnostic information is necessary to  determine patient infection status. The expected result is Negative.  Fact Sheet for Patients: BloggerCourse.com  Fact Sheet for Healthcare Providers: SeriousBroker.it  This test is not yet approved or cleared by the [place] FDA and  has been authorized for detection and/or diagnosis of SARS-CoV-2 by FDA under an Emergency Use Authorization (EUA).  This EUA will remain in effect (meaning this test can be u sed) for the duration of  the COVID-19 declaration under Section 564(b)(1) of the Act, 21 U.S.C. section 360bbb-3(b)(1), unless the authorization is terminated or revoked sooner.     Influenza A by PCR NEGATIVE NEGATIVE Final   Influenza B by PCR NEGATIVE NEGATIVE Final    Comment: (NOTE) The Xpert Xpress SARS-CoV-2/FLU/RSV plus assay is intended as an aid in the diagnosis of influenza from Nasopharyngeal  swab specimens and should not be used as a sole basis for treatment. Nasal washings and aspirates are unacceptable for Xpert Xpress SARS-CoV-2/FLU/RSV testing.  Fact Sheet for Patients: BloggerCourse.com  Fact Sheet for Healthcare Providers: SeriousBroker.it  This test is not yet approved or cleared by the [place] FDA and has been authorized for detection and/or diagnosis of SARS-CoV-2 by FDA under an Emergency Use Authorization (EUA). This EUA will remain in effect (meaning this test can be used) for the duration of the COVID-19 declaration under Section 564(b)(1) of the Act, 21 U.S.C. section 360bbb-3(b)(1), unless the authorization is terminated or revoked.  Performed at [facility], [address], [place], [place] [address]      Radiology Studies: DG Chest 2 View  Result Date: 05/19/2020 CLINICAL DATA:  Shortness of breath. EXAM: CHEST - 2 VIEW COMPARISON:  October 05, 2019. FINDINGS: Stable cardiomegaly with central pulmonary vascular congestion. Bilateral lung opacities are noted which may represent pulmonary edema. No pneumothorax or pleural effusion is noted. Bony thorax is unremarkable. IMPRESSION: Stable cardiomegaly with central pulmonary vascular congestion. Bilateral lung opacities are noted which may represent pulmonary edema. Electronically Signed   By: Lupita Raider M.D.   On: 05/19/2020 15:34    Scheduled Meds: . vitamin C  500 mg Oral Daily  . enoxaparin (LOVENOX) injection  50 mg Subcutaneous Q24H  . furosemide  40 mg Intravenous Daily  . insulin aspart  0-15 Units Subcutaneous TID WC  . insulin aspart  0-5 Units Subcutaneous QHS  . zinc sulfate  220 mg Oral Daily   Continuous Infusions: . [START ON 05/21/2020] remdesivir 100 mg in NS 100 mL       LOS: 1 day   Time spent: 40 minutes.  Arnetha Courser, MD [facility]  If 7PM-7AM, please contact  night-coverage [email or web address]  05/20/2020, 9:00 AM   This record has been created using Conservation officer, historic buildings. Errors have been sought and corrected,but may not always be located. Such creation errors do not reflect on the standard of care.

## 2020-05-21 DIAGNOSIS — R0600 Dyspnea, unspecified: Secondary | ICD-10-CM | POA: Diagnosis present

## 2020-05-21 LAB — CBC WITH DIFFERENTIAL/PLATELET
Abs Immature Granulocytes: 0.03 10*3/uL (ref 0.00–0.07)
Basophils Absolute: 0 10*3/uL (ref 0.0–0.1)
Basophils Relative: 1 %
Eosinophils Absolute: 0 10*3/uL (ref 0.0–0.5)
Eosinophils Relative: 1 %
HCT: 31.7 % — ABNORMAL LOW (ref 36.0–46.0)
Hemoglobin: 9.3 g/dL — ABNORMAL LOW (ref 12.0–15.0)
Immature Granulocytes: 1 %
Lymphocytes Relative: 21 %
Lymphs Abs: 1.2 10*3/uL (ref 0.7–4.0)
MCH: 24.5 pg — ABNORMAL LOW (ref 26.0–34.0)
MCHC: 29.3 g/dL — ABNORMAL LOW (ref 30.0–36.0)
MCV: 83.6 fL (ref 80.0–100.0)
Monocytes Absolute: 0.5 10*3/uL (ref 0.1–1.0)
Monocytes Relative: 9 %
Neutro Abs: 3.8 10*3/uL (ref 1.7–7.7)
Neutrophils Relative %: 67 %
Platelets: 493 10*3/uL — ABNORMAL HIGH (ref 150–400)
RBC: 3.79 MIL/uL — ABNORMAL LOW (ref 3.87–5.11)
RDW: 15.9 % — ABNORMAL HIGH (ref 11.5–15.5)
WBC: 5.5 10*3/uL (ref 4.0–10.5)
nRBC: 0 % (ref 0.0–0.2)

## 2020-05-21 LAB — LIPID PANEL
Cholesterol: 212 mg/dL — ABNORMAL HIGH (ref 0–200)
HDL: 35 mg/dL — ABNORMAL LOW (ref >40)
LDL Cholesterol: 153 mg/dL — ABNORMAL HIGH (ref 0–99)
Total CHOL/HDL Ratio: 6.1 RATIO
Triglycerides: 121 mg/dL (ref <150)
VLDL: 24 mg/dL (ref 0–40)

## 2020-05-21 LAB — COMPREHENSIVE METABOLIC PANEL
ALT: 13 U/L (ref 0–44)
AST: 17 U/L (ref 15–41)
Albumin: 1.2 g/dL — ABNORMAL LOW (ref 3.5–5.0)
Alkaline Phosphatase: 70 U/L (ref 38–126)
Anion gap: 9 (ref 5–15)
BUN: 16 mg/dL (ref 6–20)
CO2: 29 mmol/L (ref 22–32)
Calcium: 7.1 mg/dL — ABNORMAL LOW (ref 8.9–10.3)
Chloride: 100 mmol/L (ref 98–111)
Creatinine, Ser: 1.07 mg/dL — ABNORMAL HIGH (ref 0.44–1.00)
GFR, Estimated: >60 mL/min (ref >60)
Glucose, Bld: 253 mg/dL — ABNORMAL HIGH (ref 70–99)
Potassium: 3.8 mmol/L (ref 3.5–5.1)
Sodium: 138 mmol/L (ref 135–145)
Total Bilirubin: 0.5 mg/dL (ref 0.3–1.2)
Total Protein: 5.2 g/dL — ABNORMAL LOW (ref 6.5–8.1)

## 2020-05-21 LAB — FIBRIN DERIVATIVES D-DIMER (ARMC ONLY): Fibrin derivatives D-dimer (ARMC): 1098.26 ng/mL (FEU) — ABNORMAL HIGH (ref 0.00–499.00)

## 2020-05-21 LAB — MAGNESIUM: Magnesium: 1.6 mg/dL — ABNORMAL LOW (ref 1.7–2.4)

## 2020-05-21 LAB — GLUCOSE, CAPILLARY
Glucose-Capillary: 100 mg/dL — ABNORMAL HIGH (ref 70–99)
Glucose-Capillary: 145 mg/dL — ABNORMAL HIGH (ref 70–99)

## 2020-05-21 LAB — CBG MONITORING, ED
Glucose-Capillary: 304 mg/dL — ABNORMAL HIGH (ref 70–99)
Glucose-Capillary: 266 mg/dL — ABNORMAL HIGH (ref 70–99)

## 2020-05-21 LAB — PHOSPHORUS: Phosphorus: 3.8 mg/dL (ref 2.5–4.6)

## 2020-05-21 LAB — C-REACTIVE PROTEIN: CRP: 0.9 mg/dL (ref <1.0)

## 2020-05-21 MED ORDER — INSULIN ASPART PROT & ASPART (70-30 MIX) 100 UNIT/ML ~~LOC~~ SUSP: 14.0000 [IU] | Freq: Two times a day (BID) | SUBCUTANEOUS | Status: DC

## 2020-05-21 MED ORDER — IPRATROPIUM-ALBUTEROL 20-100 MCG/ACT IN AERS
1.0000 | INHALATION_SPRAY | Freq: Four times a day (QID) | RESPIRATORY_TRACT | Status: DC | PRN
  Administered 2020-05-21: 1 via RESPIRATORY_TRACT
  Filled 2020-05-21 (×2): qty 4

## 2020-05-21 MED ORDER — INSULIN ISOPHANE & REGULAR (HUMAN 70-30)100 UNIT/ML KWIKPEN: 14.0000 [IU] | PEN_INJECTOR | Freq: Two times a day (BID) | SUBCUTANEOUS | Status: DC

## 2020-05-21 MED ORDER — POLYSACCHARIDE IRON COMPLEX 150 MG PO CAPS
150.0000 mg | ORAL_CAPSULE | Freq: Every day | ORAL | Status: DC
  Administered 2020-05-22: 150 mg via ORAL
  Filled 2020-05-21: qty 1

## 2020-05-21 NOTE — Progress Notes (Signed)
Inpatient Diabetes Program Recommendations  AACE/ADA: New Consensus Statement on Inpatient Glycemic Control (2015)  Target Ranges:  Prepandial:   less than 140 mg/dL      Peak postprandial:   less than 180 mg/dL (1-2 hours)      Critically ill patients:  140 - 180 mg/dL   Lab Results  Component Value Date   GLUCAP 304 (H) 05/21/2020   HGBA1C 11.8 (H) 05/20/2020    Review of Glycemic Control Results for SHRONDA, BOEH (MRN [id number]) as of 05/21/2020 11:24  Ref. Range 05/20/2020 18:11 05/20/2020 22:05 05/21/2020 09:40  Glucose-Capillary Latest Ref Range: 70 - 99 mg/dL 536 (H) 468 (H) 032 (H)   Diabetes history: Type 2 DM Outpatient Diabetes medications: Novolin 70/30 14 units BID Current orders for Inpatient glycemic control: Novolog 0-15 units TID, Novolog 0-5 units QHS Prednisone 50 mg x 1  Inpatient Diabetes Program Recommendations:    Consider adding Levemir 10 units BID and Tradjenta 5 mg QD.  Appears patient has not been able to afford 70/30 outpatient, will place [facility] consult. Attempted to reach patient, unsuccessful. Will reattempt.   Thanks, Lujean Rave, MSN, RNC-OB Diabetes Coordinator [phone] (8a-5p)

## 2020-05-21 NOTE — Progress Notes (Signed)
PROGRESS NOTE    TYNISHA OGAN  RAQ:[id number] DOB: [birth year] DOA: 05/19/2020 PCP: Patient, No Pcp Per   Brief Narrative: Taken from H&P. SAM WUNSCHEL is a 35 y.o. female with medical history significant for combined systolic and diastolic heart failure (35 to 45% EF on 10/2019), uncontrolled insulin-dependent type II diabetes, chronic anemia, history of polysubstance abuse (cocaine) who presents with complaints of increasing shortness of breath.  For the past 3 weeks she has noted increasing shortness of breath especially on exertion.  Also has had PND, orthopnea, increasing lower extremity edema as well as abdominal distention.  She has not been able to afford any of her medication.  Started taking her mother's Lasix about 3 weeks ago which initially helped but discontinued after it did not seem to be doing anything.  COVID-19 came back positive.  Elevated BNP, procalcitonin at less than 0.10 Chest x-ray with bilateral infiltrate and pulmonary vascular congestion.  Subjective: Pt reported breathing and swelling both improved.  No N/V/D.  Normal oral intake.   Assessment & Plan:   Principal Problem:   Chronic combined systolic and diastolic heart failure (HCC) Active Problems:   Elevated troponin   Essential hypertension   Uncontrolled diabetes mellitus (HCC)   Anemia   Thrombocytosis   Obesity (BMI 30-39.9)   Acute on chronic combined systolic and diastolic heart failure (HCC)   Dyspnea  COVID-19 infection.  Currently saturating in low 90s on room air, CRP 1.4, PCT less than 0.10.  --She was started on remdesivir and steroid --no cough, no fever, no other covid symptoms Plan: --d/c Remdesivir and steroid as pt's respiratory symptoms are caused by fluid overload  Acute on chronic HFrEF exacerbation EF of 35 to 40% with grade 2 diastolic dysfunction. CXR showed "central pulmonary vascular congestion. Bilateral lung opacities are noted which may represent pulmonary  ddema." --BNP 716 Plan: --cont IV lasix 40 mg daily (can't be aggressive due to Cr fluctuating.) -Strict intake and output -Daily weight and BMP  Essential hypertension.  medication non-compliance Blood pressure elevated. Lisinopril was listed on her home meds but she was not taking it Plan: --cont IV lasix 40 mg daily  --cont coreg and lisinopril  Uncontrolled diabetes mellitus with hyperglycemia. Insulin non-compliance --A1c 11.8 --SSI  Chronic anemia, iron def --anemia workup showed iron def --start oral iron suppl  Chronic thrombocytosis. Stable  History of polysubstance abuse.  Patient denies any recent use but UDS is positive for cocaine.    Stage II obesity. Body mass index is 31.32 kg/m.   Objective: Vitals:   05/21/20 0900 05/21/20 1000 05/21/20 1240 05/21/20 1500  BP: (!) 124/91 (!) 135/103 120/86 (!) 122/94  Pulse: 95 94 90 95  Resp:   16 20  Temp:    97.7 F (36.5 C)  TempSrc:    Oral  SpO2: 97% 99% 100% 100%  Weight:      Height:        Intake/Output Summary (Last 24 hours) at 05/21/2020 1657 Last data filed at 05/21/2020 1501 Gross per 24 hour  Intake --  Output 1150 ml  Net -1150 ml   Filed Weights   05/19/20 1512  Weight: 90.7 kg    Examination:  Constitutional: NAD, AAOx3 HEENT: conjunctivae and lids normal, EOMI CV: No cyanosis.   RESP: clear, normal respiratory effort, on RA Extremities: tense non-pitting edema in BLE, arms swollen, L>R SKIN: warm, dry Neuro: II - XII grossly intact.   Psych: Normal mood and affect.  Appropriate  judgement and reason   DVT prophylaxis: Lovenox Code Status: Full Family Communication:  Disposition Plan:  Status is: Inpatient  Remains inpatient appropriate because:Inpatient level of care appropriate due to severity of illness   Dispo: The patient is from: Home              Anticipated d/c is to: Home              Anticipated d/c date is: tomorrow              Patient currently is not  medically stable to d/c.  On IV lasix for fluid overload    Consultants:   None  Procedures:  Antimicrobials:   Data Reviewed: I have personally reviewed following labs and imaging studies  CBC: Recent Labs  Lab 05/19/20 1516 05/20/20 0042 05/20/20 0757 05/21/20 0643  WBC 4.7 4.4 3.8* 5.5  NEUTROABS  --  3.0  --  3.8  HGB 10.6* 9.9* 10.4* 9.3*  HCT 35.4* 34.0* 34.5* 31.7*  MCV 84.3 84.6 84.1 83.6  PLT 491* 462* 444* 493*   Basic Metabolic Panel: Recent Labs  Lab 05/19/20 1516 05/20/20 0757 05/21/20 0643  NA 137 139 138  K 3.7 3.6 3.8  CL 96* 102 100  CO2 28 28 29   GLUCOSE 220* 183* 253*  BUN 11 10 16   CREATININE 0.98 0.86 1.07*  CALCIUM 7.4* 7.0* 7.1*  MG  --   --  1.6*  PHOS  --   --  3.8   GFR: Estimated Creatinine Clearance: 84.8 mL/min (A) (by C-G formula based on SCr of 1.07 mg/dL (H)). Liver Function Tests: Recent Labs  Lab 05/21/20 0643  AST 17  ALT 13  ALKPHOS 70  BILITOT 0.5  PROT 5.2*  ALBUMIN 1.2*   No results for input(s): LIPASE, AMYLASE in the last 168 hours. No results for input(s): AMMONIA in the last 168 hours. Coagulation Profile: No results for input(s): INR, PROTIME in the last 168 hours. Cardiac Enzymes: No results for input(s): CKTOTAL, CKMB, CKMBINDEX, TROPONINI in the last 168 hours. BNP (last 3 results) No results for input(s): PROBNP in the last 8760 hours. HbA1C: Recent Labs    05/20/20 0757  HGBA1C 11.8*   CBG: Recent Labs  Lab 05/20/20 1629 05/20/20 1811 05/20/20 2205 05/21/20 0940 05/21/20 1131  GLUCAP 190* 249* 336* 304* 266*   Lipid Profile: Recent Labs    05/21/20 0643  CHOL 212*  HDL 35*  LDLCALC 153*  TRIG 121  CHOLHDL 6.1   Thyroid Function Tests: No results for input(s): TSH, T4TOTAL, FREET4, T3FREE, THYROIDAB in the last 72 hours. Anemia Panel: Recent Labs    05/20/20 0042 05/20/20 0757  VITAMINB12  --  292  FOLATE  --  7.8  FERRITIN 7*  --   TIBC  --  206*  IRON  --  18*    Sepsis Labs: Recent Labs  Lab 05/20/20 0042  PROCALCITON <0.10    Recent Results (from the past 240 hour(s))  Resp Panel by RT-PCR (Flu A&B, Covid) Nasopharyngeal Swab     Status: Abnormal   Collection Time: 05/19/20 10:42 PM   Specimen: Nasopharyngeal Swab; Nasopharyngeal(NP) swabs in vial transport medium  Result Value Ref Range Status   SARS Coronavirus 2 by RT PCR POSITIVE (A) NEGATIVE Final    Comment: RESULT CALLED TO, READ BACK BY AND VERIFIED WITH: JENNIFER AGNEW @0003  05/20/20 RH (NOTE) SARS-CoV-2 target nucleic acids are DETECTED.  The SARS-CoV-2 RNA is generally detectable in upper  respiratory specimens during the acute phase of infection. Positive results are indicative of the presence of the identified virus, but do not rule out bacterial infection or co-infection with other pathogens not detected by the test. Clinical correlation with patient history and other diagnostic information is necessary to determine patient infection status. The expected result is Negative.  Fact Sheet for Patients: BloggerCourse.com  Fact Sheet for Healthcare Providers: SeriousBroker.it  This test is not yet approved or cleared by the [place] FDA and  has been authorized for detection and/or diagnosis of SARS-CoV-2 by FDA under an Emergency Use Authorization (EUA).  This EUA will remain in effect (meaning this test can be u sed) for the duration of  the COVID-19 declaration under Section 564(b)(1) of the Act, 21 U.S.C. section 360bbb-3(b)(1), unless the authorization is terminated or revoked sooner.     Influenza A by PCR NEGATIVE NEGATIVE Final   Influenza B by PCR NEGATIVE NEGATIVE Final    Comment: (NOTE) The Xpert Xpress SARS-CoV-2/FLU/RSV plus assay is intended as an aid in the diagnosis of influenza from Nasopharyngeal swab specimens and should not be used as a sole basis for treatment. Nasal washings and aspirates  are unacceptable for Xpert Xpress SARS-CoV-2/FLU/RSV testing.  Fact Sheet for Patients: BloggerCourse.com  Fact Sheet for Healthcare Providers: SeriousBroker.it  This test is not yet approved or cleared by the [place] FDA and has been authorized for detection and/or diagnosis of SARS-CoV-2 by FDA under an Emergency Use Authorization (EUA). This EUA will remain in effect (meaning this test can be used) for the duration of the COVID-19 declaration under Section 564(b)(1) of the Act, 21 U.S.C. section 360bbb-3(b)(1), unless the authorization is terminated or revoked.  Performed at [facility], [address] [place], [place] [address]      Radiology Studies: ECHOCARDIOGRAM COMPLETE  Result Date: 05/20/2020    ECHOCARDIOGRAM REPORT   Patient Name:   ZOEIE RITTER Date of Exam: 05/20/2020 Medical Rec #:  [id number]     Height:       67.0 in Accession #:    [id number]    Weight:       200.0 lb Date of Birth:  Aug 17, 1985     BSA:          2.022 m Patient Age:    34 years      BP:           130/100 mmHg Patient Gender: F             HR:           103 bpm. Exam Location:  [facility] Procedure: 2D Echo, Cardiac Doppler and Color Doppler Indications:     CHF- acute systolic I50.21  History:         Patient has prior history of Echocardiogram examinations, most                  recent 10/03/2019. Risk Factors:Diabetes. Acid reflux.  Sonographer:     Cristela Blue RDCS (AE) Referring Phys:  [id number] Sunny Schlein TU Diagnosing Phys: Cristal Deer End MD IMPRESSIONS  1. Left ventricular ejection fraction, by estimation, is 20 to 25%. The left ventricle has severely decreased function. The left ventricle demonstrates global hypokinesis. The left ventricular internal cavity size was mildly dilated. Indeterminate diastolic filling due to E-A fusion.  2. Right ventricular systolic function is normal. The right ventricular size is mildly enlarged. There is severely  elevated pulmonary artery systolic pressure.  3. The mitral  valve is normal in structure. Trivial mitral valve regurgitation. No evidence of mitral stenosis.  4. Tricuspid valve regurgitation is moderate.  5. The aortic valve was not well visualized. Aortic valve regurgitation is not visualized. No aortic stenosis is present.  6. The inferior vena cava is dilated in size with <50% respiratory variability, suggesting right atrial pressure of 15 mmHg. FINDINGS  Left Ventricle: Left ventricular ejection fraction, by estimation, is 20 to 25%. The left ventricle has severely decreased function. The left ventricle demonstrates global hypokinesis. The left ventricular internal cavity size was mildly dilated. There is borderline left ventricular hypertrophy. Indeterminate diastolic filling due to E-A fusion. Right Ventricle: The right ventricular size is mildly enlarged. No increase in right ventricular wall thickness. Right ventricular systolic function is normal. There is severely elevated pulmonary artery systolic pressure. The tricuspid regurgitant velocity is 3.54 m/s, and with an assumed right atrial pressure of 15 mmHg, the estimated right ventricular systolic pressure is 65.1 mmHg. Left Atrium: Left atrial size was normal in size. Right Atrium: Right atrial size was normal in size. Pericardium: Trivial pericardial effusion is present. Mitral Valve: The mitral valve is normal in structure. Trivial mitral valve regurgitation. No evidence of mitral valve stenosis. Tricuspid Valve: The tricuspid valve is grossly normal. Tricuspid valve regurgitation is moderate. Aortic Valve: The aortic valve was not well visualized. Aortic valve regurgitation is not visualized. No aortic stenosis is present. Aortic valve mean gradient measures 3.0 mmHg. Aortic valve peak gradient measures 5.7 mmHg. Aortic valve area, by VTI measures 2.79 cm. Pulmonic Valve: The pulmonic valve was not well visualized. Pulmonic valve regurgitation is  mild. Aorta: The aortic root is normal in size and structure. Pulmonary Artery: The pulmonary artery is of normal size. Venous: The inferior vena cava is dilated in size with less than 50% respiratory variability, suggesting right atrial pressure of 15 mmHg. IAS/Shunts: The interatrial septum was not well visualized.  LEFT VENTRICLE PLAX 2D LVIDd:         5.30 cm      Diastology LVIDs:         4.50 cm      LV e' medial:    4.24 cm/s LV PW:         1.09 cm      LV E/e' medial:  14.6 LV IVS:        0.85 cm      LV e' lateral:   6.53 cm/s LVOT diam:     2.00 cm      LV E/e' lateral: 9.5 LV SV:         50 LV SV Index:   25 LVOT Area:     3.14 cm  LV Volumes (MOD) LV vol d, MOD A2C: 154.0 ml LV vol d, MOD A4C: 130.0 ml LV vol s, MOD A2C: 96.2 ml LV vol s, MOD A4C: 91.8 ml LV SV MOD A2C:     57.8 ml LV SV MOD A4C:     130.0 ml LV SV MOD BP:      41.9 ml RIGHT VENTRICLE RV S prime:     13.20 cm/s TAPSE (M-mode): 2.0 cm LEFT ATRIUM             Index       RIGHT ATRIUM           Index LA diam:        4.40 cm 2.18 cm/m  RA Area:     15.90 cm LA Vol (A2C):  58.7 ml 29.03 ml/m RA Volume:   44.60 ml  22.05 ml/m LA Vol (A4C):   46.1 ml 22.80 ml/m LA Biplane Vol: 54.9 ml 27.15 ml/m  AORTIC VALVE                   PULMONIC VALVE AV Area (Vmax):    1.84 cm    PV Vmax:        0.92 m/s AV Area (Vmean):   1.70 cm    PV Peak grad:   3.4 mmHg AV Area (VTI):     2.79 cm    RVOT Peak grad: 2 mmHg AV Vmax:           119.00 cm/s AV Vmean:          84.100 cm/s AV VTI:            0.179 m AV Peak Grad:      5.7 mmHg AV Mean Grad:      3.0 mmHg LVOT Vmax:         69.80 cm/s LVOT Vmean:        45.500 cm/s LVOT VTI:          0.159 m LVOT/AV VTI ratio: 0.89  AORTA Ao Root diam: 3.13 cm MITRAL VALVE               TRICUSPID VALVE MV Area (PHT): 8.52 cm    TR Peak grad:   50.1 mmHg MV Decel Time: 89 msec     TR Vmax:        354.00 cm/s MV E velocity: 62.10 cm/s MV A velocity: 85.30 cm/s  SHUNTS MV E/A ratio:  0.73        Systemic VTI:   0.16 m                            Systemic Diam: 2.00 cm Yvonne Kendallhristopher End MD Electronically signed by Yvonne Kendallhristopher End MD Signature Date/Time: 05/20/2020/6:21:36 PM    Final     Scheduled Meds: . vitamin C  500 mg Oral Daily  . aspirin EC  81 mg Oral Daily  . atorvastatin  20 mg Oral Daily  . carvedilol  6.25 mg Oral BID WC  . enoxaparin (LOVENOX) injection  50 mg Subcutaneous Q24H  . furosemide  40 mg Intravenous Daily  . insulin aspart  0-15 Units Subcutaneous TID WC  . insulin aspart  0-5 Units Subcutaneous QHS  . lisinopril  5 mg Oral Daily  . zinc sulfate  220 mg Oral Daily   Continuous Infusions:    LOS: 2 days     Darlin Priestlyina Silas Muff, MD [facility]  If 7PM-7AM, please contact night-coverage [email or web address]  05/21/2020, 4:57 PM

## 2020-05-21 NOTE — ED Notes (Signed)
Taken to floor by this Char[employer] fundraiser. All of belongings taken with patient. ambulated to bed without difficulty.

## 2020-05-22 ENCOUNTER — Telehealth: Payer: Self-pay | Admitting: Family

## 2020-05-22 ENCOUNTER — Other Ambulatory Visit: Payer: Self-pay

## 2020-05-22 LAB — C-REACTIVE PROTEIN: CRP: 0.6 mg/dL (ref <1.0)

## 2020-05-22 LAB — CBC
HCT: 31.9 % — ABNORMAL LOW (ref 36.0–46.0)
Hemoglobin: 9.5 g/dL — ABNORMAL LOW (ref 12.0–15.0)
MCH: 24.4 pg — ABNORMAL LOW (ref 26.0–34.0)
MCHC: 29.8 g/dL — ABNORMAL LOW (ref 30.0–36.0)
MCV: 82 fL (ref 80.0–100.0)
Platelets: 499 10*3/uL — ABNORMAL HIGH (ref 150–400)
RBC: 3.89 MIL/uL (ref 3.87–5.11)
RDW: 16 % — ABNORMAL HIGH (ref 11.5–15.5)
WBC: 5.2 10*3/uL (ref 4.0–10.5)
nRBC: 0 % (ref 0.0–0.2)

## 2020-05-22 LAB — MAGNESIUM: Magnesium: 1.6 mg/dL — ABNORMAL LOW (ref 1.7–2.4)

## 2020-05-22 LAB — BASIC METABOLIC PANEL
Anion gap: 10 (ref 5–15)
BUN: 18 mg/dL (ref 6–20)
CO2: 29 mmol/L (ref 22–32)
Calcium: 7 mg/dL — ABNORMAL LOW (ref 8.9–10.3)
Chloride: 99 mmol/L (ref 98–111)
Creatinine, Ser: 0.99 mg/dL (ref 0.44–1.00)
GFR, Estimated: >60 mL/min (ref >60)
Glucose, Bld: 149 mg/dL — ABNORMAL HIGH (ref 70–99)
Potassium: 3.9 mmol/L (ref 3.5–5.1)
Sodium: 138 mmol/L (ref 135–145)

## 2020-05-22 LAB — GLUCOSE, CAPILLARY
Glucose-Capillary: 140 mg/dL — ABNORMAL HIGH (ref 70–99)
Glucose-Capillary: 207 mg/dL — ABNORMAL HIGH (ref 70–99)

## 2020-05-22 MED ORDER — INFLUENZA VAC SPLIT QUAD 0.5 ML IM SUSY: 0.5000 mL | PREFILLED_SYRINGE | Freq: Once | INTRAMUSCULAR | 0 refills | Status: AC

## 2020-05-22 MED ORDER — CARVEDILOL 6.25 MG PO TABS: 6.2500 mg | ORAL_TABLET | Freq: Two times a day (BID) | ORAL | 2 refills | Status: DC

## 2020-05-22 MED ORDER — MAGNESIUM SULFATE 2 GM/50ML IV SOLN
2.0000 g | Freq: Once | INTRAVENOUS | Status: AC
  Administered 2020-05-22: 2 g via INTRAVENOUS
  Filled 2020-05-22: qty 50

## 2020-05-22 MED ORDER — LIVING WELL WITH DIABETES BOOK
Freq: Once | Status: DC
  Filled 2020-05-22 (×2): qty 1

## 2020-05-22 MED ORDER — ATORVASTATIN CALCIUM 20 MG PO TABS: 20.0000 mg | ORAL_TABLET | Freq: Every day | ORAL | 2 refills | Status: DC

## 2020-05-22 MED ORDER — NOVOLIN 70/30 FLEXPEN RELION (70-30) 100 UNIT/ML ~~LOC~~ SUPN: 10.0000 [IU] | PEN_INJECTOR | Freq: Two times a day (BID) | SUBCUTANEOUS | 2 refills | Status: DC

## 2020-05-22 MED ORDER — POLYSACCHARIDE IRON COMPLEX 150 MG PO CAPS: 150.0000 mg | ORAL_CAPSULE | Freq: Every day | ORAL | 2 refills | Status: DC

## 2020-05-22 MED ORDER — LISINOPRIL 5 MG PO TABS: 5.0000 mg | ORAL_TABLET | Freq: Every day | ORAL | 2 refills | Status: DC

## 2020-05-22 MED ORDER — INFLUENZA VAC SPLIT QUAD 0.5 ML IM SUSY: 0.5000 mL | PREFILLED_SYRINGE | INTRAMUSCULAR | Status: DC

## 2020-05-22 MED ORDER — FUROSEMIDE 40 MG PO TABS: 40.0000 mg | ORAL_TABLET | Freq: Every day | ORAL | 2 refills | Status: DC

## 2020-05-22 NOTE — Progress Notes (Signed)
I was informed by the unit clerk that the patient was just discharge

## 2020-05-22 NOTE — Progress Notes (Signed)
Inpatient Diabetes Program Recommendations  AACE/ADA: New Consensus Statement on Inpatient Glycemic Control (2015)  Target Ranges:  Prepandial:   less than 140 mg/dL      Peak postprandial:   less than 180 mg/dL (1-2 hours)      Critically ill patients:  140 - 180 mg/dL   Lab Results  Component Value Date   GLUCAP 207 (H) 05/22/2020   HGBA1C 11.8 (H) 05/20/2020   Attempted several times ov er the last 2 days to reach patient by phone to discuss diabetes regimen and A1C without success.   Will continue to follow while inpatient.  Thank you, Dulce Sellar, RN, BSN Diabetes Coordinator Inpatient Diabetes Program [phone] (team pager from 8a-5p)

## 2020-05-22 NOTE — Progress Notes (Signed)
I was informed that the patient had called for a wheelchair and had left. I had given her her papers for the [facility] and a list of free health care resources. I had also had given her the bag of medicines from [facility]. I did not give her the discharge papers, nor explain and demonstrate insulin injections, or removed the patients IV. I attempted to call the patient's home and cell number but neither of them worked. I was able to get in contact with the patient's mother, Molly Howard, who was going to call me back with the number for the patients sister who had picked the patient up. Molly Howard never called me back. After several attempts Molly Howard did answer. She said the patient was at her house, (Molly Howard's house) and was asleep. Molly Howard would not wake the patient up but said that the patient did not have an IV and that the patient and Molly Howard both knew how to do insulin injections. I informed Molly Howard that I would mail discharge instructions to the home

## 2020-05-22 NOTE — Discharge Summary (Signed)
Physician Discharge Summary   Molly Howard  female DOB: Apr 18, 1986  DJT:[id number]  PCP: Patient, No Pcp Per  Admit date: 05/19/2020 Discharge date: 05/22/2020  Admitted From: home Disposition:  Home.   All cardiac meds and insulin were prescribed to [facility] and given to pt free at discharge to take home.  Pt was given information for [facility] to establish care there.    CODE STATUS: Full code  Discharge Instructions    Discharge instructions   Complete by: As directed    We are sending you home with cardiac medications, Lasix and insulin, from [facility].    Please follow [facility].  You are iron deficient which is causing your anemia.  I have started you on iron supplement.   Dr. Enzo Bi [facility] Course:  For full details, please see H&P, progress notes, consult notes and ancillary notes.  Briefly,  Molly L Sneedis a 35 y.o.femalewith medical history significant forcombined systolic and diastolic heart failure (35 to 40% EF on 10/2019), uncontrolled insulin-dependent type II diabetes, chronic anemia, history of polysubstance abuse(cocaine)who presented with complaints of increasing shortness of breath.  For the past 3 weeks she had noted increasing shortness of breath especially on exertion. Also has had PND, orthopnea, increasing lower extremity edema as well as abdominal distention. She had not been able to afford any of her medication. Started taking her mother's Lasix about 3 weeks ago which initially helped but discontinued after it did not seem to be doing anything.  COVID-19 came back positive.  Elevated BNP, procalcitonin at less than 0.10.  Chest x-ray with bilateral infiltrate and pulmonary vascular congestion.  COVID-19 infection.  Currently saturating in low 90s on room air, CRP 1.4, PCT less than 0.10.  no cough, no fever, no other covid symptoms.  Pt was started on remdesivir and steroid  initially, but both since d/c'ed as pt's respiratory symptoms are caused by fluid overload.  Acute on chronic HFrEF exacerbation Medication non-compliance EF of 35 to 40% with grade 2 diastolic dysfunction.  CXR showed "central pulmonary vascular congestion. Bilateral lung opacities are noted which may represent pulmonary ddema."  BNP 716.  Pt received IV lasix 40 mg daily (couldn't be aggressive due to Cr fluctuating), and her dyspnea improved.  Pt was discharged on Lasix 40 mg daily which was what pt was prescribed previously but not taking.  Continued coreg and Lisinopril.  All cardiac meds were prescribed from [facility] and given to pt to take home at discharge.  Essential hypertension.  medication non-compliance Blood pressure elevated. Coreg and Lisinopril were listed on her home meds but she was not taking it.  Pt received IV lasix 40 mg daily while inpatient and continued on coreg and Lisinopril.   Uncontrolled diabetes mellitus with hyperglycemia. Insulin non-compliance A1c 11.8.  Pt was discharged from insulin 70/30 10u BID with meals.  Insulin provided from [facility] at discharge.  Chronic anemia, iron def anemia workup showed iron def.  Pt was started on oral iron suppl  Chronic thrombocytosis. Stable  History of polysubstance abuse.  Patient denied any recent use but UDS is positive for cocaine.  Stage II obesity. Body mass index is 31.32 kg/m.   Discharge Diagnoses:  Principal Problem:   Chronic combined systolic and diastolic heart failure (HCC) Active Problems:   Elevated troponin   Essential hypertension   Uncontrolled diabetes mellitus (HCC)   Anemia  Thrombocytosis   Obesity (BMI 30-39.9)   Acute on chronic combined systolic and diastolic heart failure (HCC)   Dyspnea    Discharge Instructions:  Allergies as of 05/22/2020      Reactions   No [employer] Food Allergies Rash, Other (See Comments)   Lemons      Medication List     STOP taking these medications   acetaminophen 325 MG tablet Commonly known as: TYLENOL   ondansetron 4 MG tablet Commonly known as: ZOFRAN   senna-docusate 8.6-50 MG tablet Commonly known as: Senokot-S     TAKE these medications   aspirin 81 MG EC tablet Take 1 tablet (81 mg total) by mouth daily.   atorvastatin 20 MG tablet Commonly known as: LIPITOR Take 1 tablet (20 mg total) by mouth daily. Start taking on: May 23, 2020   blood glucose meter kit and supplies Kit Dispense based on patient and insurance preference. Use up to four times daily as directed. (FOR ICD-9 250.00, 250.01).   carvedilol 6.25 MG tablet Commonly known as: COREG Take 1 tablet (6.25 mg total) by mouth 2 (two) times daily with a meal.   furosemide 40 MG tablet Commonly known as: LASIX Take 1 tablet (40 mg total) by mouth daily.   influenza vac split quadrivalent PF 0.5 ML injection Commonly known as: FLUARIX Inject 0.5 mLs into the muscle once for 1 dose.   iron polysaccharides 150 MG capsule Commonly known as: NIFEREX Take 1 capsule (150 mg total) by mouth daily. Start taking on: May 23, 2020   lisinopril 5 MG tablet Commonly known as: ZESTRIL Take 1 tablet (5 mg total) by mouth daily.   NovoLIN 70/30 FlexPen Relion (70-30) 100 UNIT/ML KwikPen Generic drug: insulin isophane & regular human Inject 10 Units into the skin 2 (two) times daily with a meal. What changed:   how much to take  when to take this   ReliOn Mini Pen Needles 31G X 6 MM Misc Generic drug: Insulin Pen Needle 1 Container by Does not apply route in the morning and at bedtime.        Follow-up Information    [facility] up on 06/04/2020.   Specialty: Cardiology Why: at 9:00am. Enter through the [place] entrance Contact information: [place] [phone]       [name], MD. Schedule an  appointment as soon as possible for a visit in 1 week(s).   Specialties: Cardiology, Radiology Contact information: [address] [place] [address] [phone]               Allergies  Allergen Reactions  . No [employer] Food Allergies Rash and Other (See Comments)    Lemons     The results of significant diagnostics from this hospitalization (including imaging, microbiology, ancillary and laboratory) are listed below for reference.   Consultations:   Procedures/Studies: DG Chest 2 View  Result Date: 05/19/2020 CLINICAL DATA:  Shortness of breath. EXAM: CHEST - 2 VIEW COMPARISON:  October 05, 2019. FINDINGS: Stable cardiomegaly with central pulmonary vascular congestion. Bilateral lung opacities are noted which may represent pulmonary edema. No pneumothorax or pleural effusion is noted. Bony thorax is unremarkable. IMPRESSION: Stable cardiomegaly with central pulmonary vascular congestion. Bilateral lung opacities are noted which may represent pulmonary edema. Electronically Signed   By: Marijo Conception M.D.   On: 05/19/2020 15:34   ECHOCARDIOGRAM COMPLETE  Result Date: 05/20/2020  ECHOCARDIOGRAM REPORT   Patient Name:   Molly Howard Date of Exam: 05/20/2020 Medical Rec #:  [id number]     Height:       67.0 in Accession #:    [id number]    Weight:       200.0 lb Date of Birth:  04/30/1986     BSA:          2.022 m Patient Age:    24 years      BP:           130/100 mmHg Patient Gender: F             HR:           103 bpm. Exam Location:  [facility] Procedure: 2D Echo, Cardiac Doppler and Color Doppler Indications:     CHF- acute systolic L46.50  History:         Patient has prior history of Echocardiogram examinations, most                  recent 10/03/2019. Risk Factors:Diabetes. Acid reflux.  Sonographer:     Sherrie Sport RDCS (AE) Referring Phys:  [id number] Angela Burke T[facility] Diagnosing Phys: Harrell Gave End MD IMPRESSIONS  1. Left ventricular ejection fraction, by estimation, is 20 to 25%.  The left ventricle has severely decreased function. The left ventricle demonstrates global hypokinesis. The left ventricular internal cavity size was mildly dilated. Indeterminate diastolic filling due to E-A fusion.  2. Right ventricular systolic function is normal. The right ventricular size is mildly enlarged. There is severely elevated pulmonary artery systolic pressure.  3. The mitral valve is normal in structure. Trivial mitral valve regurgitation. No evidence of mitral stenosis.  4. Tricuspid valve regurgitation is moderate.  5. The aortic valve was not well visualized. Aortic valve regurgitation is not visualized. No aortic stenosis is present.  6. The inferior vena cava is dilated in size with <50% respiratory variability, suggesting right atrial pressure of 15 mmHg. FINDINGS  Left Ventricle: Left ventricular ejection fraction, by estimation, is 20 to 25%. The left ventricle has severely decreased function. The left ventricle demonstrates global hypokinesis. The left ventricular internal cavity size was mildly dilated. There is borderline left ventricular hypertrophy. Indeterminate diastolic filling due to E-A fusion. Right Ventricle: The right ventricular size is mildly enlarged. No increase in right ventricular wall thickness. Right ventricular systolic function is normal. There is severely elevated pulmonary artery systolic pressure. The tricuspid regurgitant velocity is 3.54 m/s, and with an assumed right atrial pressure of 15 mmHg, the estimated right ventricular systolic pressure is 12.7 mmHg. Left Atrium: Left atrial size was normal in size. Right Atrium: Right atrial size was normal in size. Pericardium: Trivial pericardial effusion is present. Mitral Valve: The mitral valve is normal in structure. Trivial mitral valve regurgitation. No evidence of mitral valve stenosis. Tricuspid Valve: The tricuspid valve is grossly normal. Tricuspid valve regurgitation is moderate. Aortic Valve: The aortic valve  was not well visualized. Aortic valve regurgitation is not visualized. No aortic stenosis is present. Aortic valve mean gradient measures 3.0 mmHg. Aortic valve peak gradient measures 5.7 mmHg. Aortic valve area, by VTI measures 2.79 cm. Pulmonic Valve: The pulmonic valve was not well visualized. Pulmonic valve regurgitation is mild. Aorta: The aortic root is normal in size and structure. Pulmonary Artery: The pulmonary artery is of normal size. Venous: The inferior vena cava is dilated in size with less than 50% respiratory variability, suggesting right atrial pressure of 15 mmHg.  IAS/Shunts: The interatrial septum was not well visualized.  LEFT VENTRICLE PLAX 2D LVIDd:         5.30 cm      Diastology LVIDs:         4.50 cm      LV e' medial:    4.24 cm/s LV PW:         1.09 cm      LV E/e' medial:  14.6 LV IVS:        0.85 cm      LV e' lateral:   6.53 cm/s LVOT diam:     2.00 cm      LV E/e' lateral: 9.5 LV SV:         50 LV SV Index:   25 LVOT Area:     3.14 cm  LV Volumes (MOD) LV vol d, MOD A2C: 154.0 ml LV vol d, MOD A4C: 130.0 ml LV vol s, MOD A2C: 96.2 ml LV vol s, MOD A4C: 91.8 ml LV SV MOD A2C:     57.8 ml LV SV MOD A4C:     130.0 ml LV SV MOD BP:      41.9 ml RIGHT VENTRICLE RV S prime:     13.20 cm/s TAPSE (M-mode): 2.0 cm LEFT ATRIUM             Index       RIGHT ATRIUM           Index LA diam:        4.40 cm 2.18 cm/m  RA Area:     15.90 cm LA Vol (A2C):   58.7 ml 29.03 ml/m RA Volume:   44.60 ml  22.05 ml/m LA Vol (A4C):   46.1 ml 22.80 ml/m LA Biplane Vol: 54.9 ml 27.15 ml/m  AORTIC VALVE                   PULMONIC VALVE AV Area (Vmax):    1.84 cm    PV Vmax:        0.92 m/s AV Area (Vmean):   1.70 cm    PV Peak grad:   3.4 mmHg AV Area (VTI):     2.79 cm    RVOT Peak grad: 2 mmHg AV Vmax:           119.00 cm/s AV Vmean:          84.100 cm/s AV VTI:            0.179 m AV Peak Grad:      5.7 mmHg AV Mean Grad:      3.0 mmHg LVOT Vmax:         69.80 cm/s LVOT Vmean:        45.500 cm/s LVOT  VTI:          0.159 m LVOT/AV VTI ratio: 0.89  AORTA Ao Root diam: 3.13 cm MITRAL VALVE               TRICUSPID VALVE MV Area (PHT): 8.52 cm    TR Peak grad:   50.1 mmHg MV Decel Time: 89 msec     TR Vmax:        354.00 cm/s MV E velocity: 62.10 cm/s MV A velocity: 85.30 cm/s  SHUNTS MV E/A ratio:  0.73        Systemic VTI:  0.16 m  Systemic Diam: 2.00 cm Nelva Bush MD Electronically signed by Nelva Bush MD Signature Date/Time: 05/20/2020/6:21:36 PM    Final       Labs: BNP (last 3 results) Recent Labs    10/02/19 1348 05/19/20 1516  BNP 391.0* 488.8*   Basic Metabolic Panel: Recent Labs  Lab 05/19/20 1516 05/20/20 0757 05/21/20 0643 05/22/20 0403  NA 137 139 138 138  K 3.7 3.6 3.8 3.9  CL 96* 102 100 99  CO2 '28 28 29 29  ' GLUCOSE 220* 183* 253* 149*  BUN '11 10 16 18  ' CREATININE 0.98 0.86 1.07* 0.99  CALCIUM 7.4* 7.0* 7.1* 7.0*  MG  --   --  1.6* 1.6*  PHOS  --   --  3.8  --    Liver Function Tests: Recent Labs  Lab 05/21/20 0643  AST 17  ALT 13  ALKPHOS 70  BILITOT 0.5  PROT 5.2*  ALBUMIN 1.2*   No results for input(s): LIPASE, AMYLASE in the last 168 hours. No results for input(s): AMMONIA in the last 168 hours. CBC: Recent Labs  Lab 05/19/20 1516 05/20/20 0042 05/20/20 0757 05/21/20 0643 05/22/20 0403  WBC 4.7 4.4 3.8* 5.5 5.2  NEUTROABS  --  3.0  --  3.8  --   HGB 10.6* 9.9* 10.4* 9.3* 9.5*  HCT 35.4* 34.0* 34.5* 31.7* 31.9*  MCV 84.3 84.6 84.1 83.6 82.0  PLT 491* 462* 444* 493* 499*   Cardiac Enzymes: No results for input(s): CKTOTAL, CKMB, CKMBINDEX, TROPONINI in the last 168 hours. BNP: Invalid input(s): POCBNP CBG: Recent Labs  Lab 05/21/20 0940 05/21/20 1131 05/21/20 1728 05/21/20 2057 05/22/20 0824  GLUCAP 304* 266* 100* 145* 140*   D-Dimer No results for input(s): DDIMER in the last 72 hours. Hgb A1c Recent Labs    05/20/20 0757  HGBA1C 11.8*   Lipid Profile Recent Labs    05/21/20 0643   CHOL 212*  HDL 35*  LDLCALC 153*  TRIG 121  CHOLHDL 6.1   Thyroid function studies No results for input(s): TSH, T4TOTAL, T3FREE, THYROIDAB in the last 72 hours.  Invalid input(s): FREET3 Anemia work up [employer]    05/20/20 0042 05/20/20 0757  VITAMINB12  --  292  FOLATE  --  7.8  FERRITIN 7*  --   TIBC  --  206*  IRON  --  18*   Urinalysis No results found for: COLORURINE, APPEARANCEUR, LABSPEC, [place], GLUCOSEU, HGBUR, BILIRUBINUR, KETONESUR, PROTEINUR, UROBILINOGEN, NITRITE, LEUKOCYTESUR Sepsis Labs Invalid input(s): PROCALCITONIN,  WBC,  LACTICIDVEN Microbiology Recent Results (from the past 240 hour(s))  Resp Panel by RT-PCR (Flu A&B, Covid) Nasopharyngeal Swab     Status: Abnormal   Collection Time: 05/19/20 10:42 PM   Specimen: Nasopharyngeal Swab; Nasopharyngeal(NP) swabs in vial transport medium  Result Value Ref Range Status   SARS Coronavirus 2 by RT PCR POSITIVE (A) NEGATIVE Final    Comment: RESULT CALLED TO, READ BACK BY AND VERIFIED WITH: JENNIFER AGNEW '@[id number]'  05/20/20 RH (NOTE) SARS-CoV-2 target nucleic acids are DETECTED.  The SARS-CoV-2 RNA is generally detectable in upper respiratory specimens during the acute phase of infection. Positive results are indicative of the presence of the identified virus, but do not rule out bacterial infection or co-infection with other pathogens not detected by the test. Clinical correlation with patient history and other diagnostic information is necessary to determine patient infection status. The expected result is Negative.  Fact Sheet for Patients: EntrepreneurPulse.com.au  Fact Sheet for Healthcare Providers: IncredibleEmployment.be  This  test is not yet approved or cleared by the [place] and  has been authorized for detection and/or diagnosis of SARS-CoV-2 by FDA under an Emergency Use Authorization (EUA).  This EUA will remain in effect (meaning this test  can be u sed) for the duration of  the COVID-19 declaration under Section 564(b)(1) of the Act, 21 U.S.C. section 360bbb-3(b)(1), unless the authorization is terminated or revoked sooner.     Influenza A by PCR NEGATIVE NEGATIVE Final   Influenza B by PCR NEGATIVE NEGATIVE Final    Comment: (NOTE) The Xpert Xpress SARS-CoV-2/FLU/RSV plus assay is intended as an aid in the diagnosis of influenza from Nasopharyngeal swab specimens and should not be used as a sole basis for treatment. Nasal washings and aspirates are unacceptable for Xpert Xpress SARS-CoV-2/FLU/RSV testing.  Fact Sheet for Patients: EntrepreneurPulse.com.au  Fact Sheet for Healthcare Providers: IncredibleEmployment.be  This test is not yet approved or cleared by the [place] FDA and has been authorized for detection and/or diagnosis of SARS-CoV-2 by FDA under an Emergency Use Authorization (EUA). This EUA will remain in effect (meaning this test can be used) for the duration of the COVID-19 declaration under Section 564(b)(1) of the Act, 21 U.S.C. section 360bbb-3(b)(1), unless the authorization is terminated or revoked.  Performed at [facility], [address] [place], [place] [address]      Total time spend on discharging this patient, including the last patient exam, discussing the hospital stay, instructions for ongoing care as it relates to all pertinent caregivers, as well as preparing the medical discharge records, prescriptions, and/or referrals as applicable, is 50 minutes.    Enzo Bi, MD  [facility] 05/22/2020, 12:42 PM

## 2020-05-22 NOTE — Clinical Social Work Note (Addendum)
MD escribed prescriptions to [facility]. CSW let voicemail and asked that they review and call back if there is anything they cannot fill.  Charlynn Court, CSW [phone]  1:00 pm: Patient on airborne/contact precautions. Tried calling in room twice but no answer. Put packet for free/low cost healthcare in [facility] and intake paperwork for [facility] in discharge packet. Sent secure chat to RN to notify.  Charlynn Court, CSW [phone]  4:36 pm: Medications picked up from [facility] and given to RN. No further concerns. Patient has orders to discharge home today. CSW signing off.  Charlynn Court, CSW [phone]

## 2020-05-30 ENCOUNTER — Ambulatory Visit: Payer: Self-pay | Admitting: Cardiology

## 2020-06-02 ENCOUNTER — Encounter: Payer: Self-pay | Admitting: Cardiology

## 2020-06-03 NOTE — Progress Notes (Deleted)
   Patient ID: Molly Howard, female    DOB: 1985/12/03, 35 y.o.   MRN: [id number]  HPI  Molly Howard is a 35 y/o female with a history of  Echo report from 05/20/20 reviewed and showed an EF of 20-25% along with severely elevated PA pressure and moderate TR.   Admitted 05/19/20 due to worsening shortness of breath as she was unable to afford her medications. Covid +. Initially given IV lasix with transition to oral diuretics. Medications re-started. Discharged after 3 days.   She presents today for her initial visit with a chief complaint of  Review of Systems    Physical Exam    Assessment & Plan:  1: Chronic heart failure with reduced ejection fraction- - NYHA class - had telemedicine visit with cardiology Dan Humphreys) 10/31/19 - BNP 05/19/20 was 716.9  2: HTN- - BP - BMP 05/22/20 reviewed and showed sodium 138, potassium 3.9, creatinine 0.99 and GFR >60  3: DM- - A1c 05/20/20 was 11.8%  4: Substance use- - UDS 05/20/20 was + for cocaine

## 2020-06-04 ENCOUNTER — Ambulatory Visit: Payer: Self-pay | Admitting: Family

## 2020-06-04 ENCOUNTER — Telehealth: Payer: Self-pay | Admitting: Family

## 2020-06-04 NOTE — Telephone Encounter (Signed)
Patient did not show for her [facility] appointment on 06/24/20. Will attempt to reschedule.

## 2020-06-13 ENCOUNTER — Other Ambulatory Visit: Payer: Self-pay

## 2020-06-13 ENCOUNTER — Ambulatory Visit (INDEPENDENT_AMBULATORY_CARE_PROVIDER_SITE_OTHER): Payer: Self-pay | Admitting: Family

## 2020-06-13 ENCOUNTER — Encounter: Payer: Self-pay | Admitting: Family

## 2020-06-13 VITALS — BP 140/102 | HR 99 | Ht 67.0 in | Wt 294.0 lb

## 2020-06-13 DIAGNOSIS — D509 Iron deficiency anemia, unspecified: Secondary | ICD-10-CM

## 2020-06-13 DIAGNOSIS — Z599 Problem related to housing and economic circumstances, unspecified: Secondary | ICD-10-CM

## 2020-06-13 DIAGNOSIS — I428 Other cardiomyopathies: Secondary | ICD-10-CM

## 2020-06-13 DIAGNOSIS — Z79899 Other long term (current) drug therapy: Secondary | ICD-10-CM

## 2020-06-13 DIAGNOSIS — F1910 Other psychoactive substance abuse, uncomplicated: Secondary | ICD-10-CM

## 2020-06-13 DIAGNOSIS — I10 Essential (primary) hypertension: Secondary | ICD-10-CM

## 2020-06-13 DIAGNOSIS — I5042 Chronic combined systolic (congestive) and diastolic (congestive) heart failure: Secondary | ICD-10-CM

## 2020-06-13 MED ORDER — POTASSIUM CHLORIDE CRYS ER 20 MEQ PO TBCR: 20.0000 meq | EXTENDED_RELEASE_TABLET | Freq: Every day | ORAL | 3 refills | Status: DC

## 2020-06-13 MED ORDER — FUROSEMIDE 80 MG PO TABS: 80.0000 mg | ORAL_TABLET | Freq: Every day | ORAL | 3 refills | Status: DC

## 2020-06-13 NOTE — Patient Instructions (Addendum)
Medication Instructions:  Your physician has recommended you make the following change in your medication:   INCREASE Lasix to 80mg  daily  START Potassium daily  *If you need a refill on your cardiac medications before your next appointment, please call your pharmacy*  Lab Work: Your provider recommends lab work today: BMP, BNP  If you have labs (blood work) drawn today and your tests are completely normal, you will receive your results only by: MyChart Message (if you have MyChart) OR . A paper copy in the mail If you have any lab test that is abnormal or we need to change your treatment, we will call you to review the results.  Testing/Procedures: Your EKG today shows normal sinus rhythm.   Follow-Up: At [facility], you and your health needs are our priority.  As part of our continuing mission to provide you with exceptional heart care, we have created designated Provider Care Teams.  These Care Teams include your primary Cardiologist (physician) and Advanced Practice Providers (APPs -  Physician Assistants and Nurse Practitioners) who all work together to provide you with the care you need, when you need it.  We recommend signing up for the patient portal called "MyChart".  Sign up information is provided on this After Visit Summary.  MyChart is used to connect with patients for Virtual Visits (Telemedicine).  Patients are able to view lab/test results, encounter notes, upcoming appointments, etc.  Non-urgent messages can be sent to your provider as well.   To learn more about what you can do with MyChart, go to [facility].    Your next appointment:   1 week(s)  The format for your next appointment:   In Person  Provider:   You may see [email or web address], MD or one of the following Advanced Practice Providers on your designated Care Team:    Debbe Odea, NP  Nicolasa Ducking, PA-C  Eula Listen, PA-C  Cadence [place], [place]  [place],  NP  Other Instructions   Drink less than 2 liters of fluid per day  Recommend adding more pillows for sleep at night  Recommend keeping your legs up at night.   One of our social workers will call you about transportation.  Schedule an appointment with [facility] [phone]  If you notice worsening shortness of breath, recommend evaluation in the emergency department.

## 2020-06-13 NOTE — Progress Notes (Signed)
Office Visit    Patient Name: Molly Howard Date of Encounter: 06/13/2020  PCP:  Patient, No Pcp Per   [facility]  Cardiologist:  [name], MD  Advanced Practice Provider:  No care team member to display Electrophysiologist:  None   Chief Complaint    Molly Howard is a 35 y.o. female with a hx of HFrEF (10/03/19 EF 35-40%) , diabetes, GERD, substance abuse (cocaine), tobacco use, anemia presents today for hospital follow up.   Past Medical History    Past Medical History:  Diagnosis Date  . Acid reflux   . Diabetes mellitus ([place])   . Obesity    Past Surgical History:  Procedure Laterality Date  . CHOLECYSTECTOMY    . HERNIA REPAIR      Allergies  Allergies  Allergen Reactions  . No [employer] Food Allergies Rash and Other (See Comments)    Lemons    History of Present Illness    Molly Howard is a 35 y.o. female with a hx of HFrEF (10/03/19 EF 35-40%) , diabetes, GERD, substance abuse (cocaine), tobacco use, anemia  last seen via telemedicine 10/31/19.  Previously seen in the ED 04/2019 with chest pain which is attributed to GERD but recommended for cardiology follow-up. This was not completed. She presented to the [facility] ED 10/02/2019 for swelling, shortness of breath, PND, dyspnea on exertion. During mission she required 2 units of PRBC due to anemia. Echo with LVEF 35 to 48%, grade 2 diastolic dysfunction. She was diuresed with IV Lasix. Underwent Lexiscan Myoview to rule out ischemia which was a low risk study. She did test positive for cocaine during admission. Discharge weight 232 lbs.  Seen in follow up 10/31/19 via phone. She had not been taking any cardiac medications due to financial barriers. Visit notably difficult as phone visit only and no vital signs available. She was started on Lasix 13m daily and Coreg 6.220mtwice daily as [facility] and [facility] out to the patient  multiple times, but was unable to reach her.   She was readmitted 05/19/20-05/22/20 with heart failure exacerbation and COVID positive. Weight on admission 200 pounds, unfortunately no discharge weight available. She was also positive for cocaine on admission. She was started on Remdesivir and steroid initially, but both were discontinued as symptoms were felt to stem from her heart failure exacerbation. She had an echo 05/20/20 with LVEF 20-25%, LV mildly dilated, LV global hypokinesis, RV mildly enlarged, severely elevated PASP, trivial MR, RA pressure 1555m. She was provided her medications at discharge and [facility] Cardiology was not consulted during her most recent admission.   Presents today for follow up with her mom, Molly Celestendorses compliance with her cardiac medications. They were provided [facility] at hospital discharge. Endorses dyspnea at rest and with exertion. Endorses orthopnea and PND. Feels swelling in her stomach and knees has increased since hospital discharge. Sleeping on one pillow, has not tried to increase. Has BP cuff at home but not checking. No scale at home to weigh. Her weight today is 294 pounds on our scale which is significantly higher than even her recent admission weight. Endorses occasional lightheadedness with position changes, but no near syncope nor syncope. She shares with me that she is not taking her insulin at home as she was not certain how to draw up the medication and inject it nor  sure what dose to take.  EKGs/Labs/Other Studies Reviewed:   The following studies were reviewed today:  Echo 05/20/20  1. Left ventricular ejection fraction, by estimation, is 20 to 25%. The  left ventricle has severely decreased function. The left ventricle  demonstrates global hypokinesis. The left ventricular internal cavity size  was mildly dilated. Indeterminate  diastolic filling due to E-A fusion.   2. Right ventricular  systolic function is normal. The right ventricular  size is mildly enlarged. There is severely elevated pulmonary artery  systolic pressure.   3. The mitral valve is normal in structure. Trivial mitral valve  regurgitation. No evidence of mitral stenosis.   4. Tricuspid valve regurgitation is moderate.   5. The aortic valve was not well visualized. Aortic valve regurgitation  is not visualized. No aortic stenosis is present.   6. The inferior vena cava is dilated in size with <50% respiratory  variability, suggesting right atrial pressure of 15 mmHg.   EKG:  EKG is  ordered today.  The ekg ordered today demonstrates NSR 99 bpm with poor R wave progression and no acute ST/T wave changes.   Recent Labs: 10/02/2019: TSH 3.286 05/19/2020: B Natriuretic Peptide 716.9 05/21/2020: ALT 13 05/22/2020: BUN 18; Creatinine, Ser 0.99; Hemoglobin 9.5; Magnesium 1.6; Platelets 499; Potassium 3.9; Sodium 138  Recent Lipid Panel    Component Value Date/Time   CHOL 212 (H) 05/21/2020 0643   TRIG 121 05/21/2020 0643   HDL 35 (L) 05/21/2020 0643   CHOLHDL 6.1 05/21/2020 0643   VLDL 24 05/21/2020 0643   LDLCALC 153 (H) 05/21/2020 0643   Home Medications   Current Meds  Medication Sig  . aspirin EC 81 MG EC tablet Take 1 tablet (81 mg total) by mouth daily.  Marland Kitchen atorvastatin (LIPITOR) 20 MG tablet Take 1 tablet (20 mg total) by mouth daily.  . blood glucose meter kit and supplies KIT Dispense based on patient and insurance preference. Use up to four times daily as directed. (FOR ICD-9 250.00, 250.01).  . carvedilol (COREG) 6.25 MG tablet Take 1 tablet (6.25 mg total) by mouth 2 (two) times daily with a meal.  . furosemide (LASIX) 40 MG tablet Take 1 tablet (40 mg total) by mouth daily.  . Insulin Pen Needle (RELION MINI PEN NEEDLES) 31G X 6 MM MISC 1 Container by Does not apply route in the morning and at bedtime.  . iron polysaccharides (NIFEREX) 150 MG capsule Take 1 capsule (150 mg total) by mouth daily.   Marland Kitchen lisinopril (ZESTRIL) 5 MG tablet Take 1 tablet (5 mg total) by mouth daily.    Review of Systems      Review of Systems  Constitutional: Negative for chills, fever and malaise/fatigue.  Cardiovascular: Positive for dyspnea on exertion and leg swelling. Negative for chest pain, irregular heartbeat, near-syncope, orthopnea, palpitations and syncope.  Respiratory: Positive for shortness of breath. Negative for cough and wheezing.   Gastrointestinal: Positive for bloating. Negative for melena, nausea and vomiting.  Genitourinary: Negative for hematuria.  Neurological: Positive for light-headedness. Negative for dizziness and weakness.   All other systems reviewed and are otherwise negative except as noted above.  Physical Exam    VS:  BP (!) 140/102 (BP Location: Left Arm, Patient Position: Sitting, Cuff Size: Normal)   Pulse 99   Ht _0  (1.702 m)   Wt 294 lb (133.4 kg)   SpO2 98%   BMI 46.05 kg/m  , BMI Body mass index is 46.05 kg/m.  Wt  Readings from Last 3 Encounters:  06/13/20 294 lb (133.4 kg)  05/19/20 200 lb (90.7 kg)  10/31/19 232 lb (105.2 kg)    GEN: Well nourished, overweight, well developed, in no acute distress. HEENT: normal. Neck: Supple, no JVD, carotid bruits, or masses. Cardiac: RRR, no murmurs, rubs, or gallops. No clubbing, cyanosis. Woody appearing edema to bilateral lower extremities. Bilateral ankles with 1+ pitting edema, bilateral knees with 2+ pitting edema, abdomen with 3+ pitting edema. Radials/PT 2+ and equal bilaterally.  Respiratory:  Respirations regular and unlabored. Diminished bases bilaterally. GI: Soft, nontender, nondistended. MS: No deformity or atrophy. Skin: Warm and dry, no rash. Neuro:  Strength and sensation are intact. Psych: Normal affect.  Assessment & Plan    1. HFrEF/Nonischemic cardiomyopathy -  Echo 10/03/19 EF 35-40%. Lexiscan 10/2019 no ischemia. 05/20/20 LVEF 20-25%. NYHA III-IV. Markedly volume overloaded. Unable to  obtain Reds Vest as it could not read. Abdomen with 3+ pitting edema, bilateral knees with 2+ pitting edema, bilateral ankles with 1+ pitting edema. Weight on admission to hospital 05/20/20 200 pounds, no additional weights obtained during hospitalization, weight today 294 pounds. Question accuracy of scales/weights. Symptomatic with dyspnea, orthopnea, PND. O2 98% today on room air, mildly tachycardia 99bpm.   As she is overall clinically stable, will plan for outpatient diuresis. Increase Lasix to 66m daily. Start Potassium 20 mEq daily. BMP, BNP today.   Reviewed ED precautions, she will present to ED for prompt evaluation if she has new or worsening shortness of breath.   Prompt follow up in 1 week for re-evaluation and repeat BMP.   If volume status not improved may need to consider IV diuresis versus transition to Torsemide. Educated on low salt diet, <2L fluid restriction. Presently drinking approx 2.5 liters of fluid per day.   Present GDMT includes Lisinopril, Coreg, Lasix. [facility] but difficult as she is without insurance and has financial constraints. Likely next step addition of Spironolactone.   Consider repeat echo 3 months after optimization of medications. To qualify for ICD for EF <35% will need to show medication compliance as well as cessation of cocaine.   2. S/p COVID 19 - Anticipate her dyspnea is due to heart failure exacerbation. Recommend vaccination against [place]  3. HTN - BP elevated in the setting of volume overload. Increase Lasix, as above. Continue Coreg 6.252mBID and Lisinopril 71m83maily. Future consideration include addition of Spironolactone, [facility] to [facility] of SGLT2i.  4. Iron deficiency anemia - 05/22/20 Hb 9.5. She was discharged on iron polysaccharides 150m18mily. Consider repeat CBC at follow up in 1 week. Anemia contributory to fluid retention. Denies bleeding complications.   5. Polysubstance abuse  - Recommend complete cessation of all drugs including cocaine. UDS positive on recent admission.   6. Financial constraint / Medication Management - [facility] with [facility]. Encouraged to take her insulin to them that she was not sure how to use for teaching. Provided [facility] to [facility] care as she was referred during hospitalization. Encouraged to discuss with their SW team application for disability. I will reach out to our SW team for assistance with resources for transportation to her cardiology appointments. May benefit from establishing with EMT paramedicine service for heart failure but will defer until follow up.   Disposition: Follow up in 1 week(s) with Dr. AgboGaren LahAPP.   Signed, CaitLoel Dubonnet 06/13/2020, 11:19 AM [facility]

## 2020-06-14 LAB — BASIC METABOLIC PANEL
BUN/Creatinine Ratio: 7 — ABNORMAL LOW (ref 9–23)
BUN: 7 mg/dL (ref 6–20)
CO2: 27 mmol/L (ref 20–29)
Calcium: 7.3 mg/dL — ABNORMAL LOW (ref 8.7–10.2)
Chloride: 96 mmol/L (ref 96–106)
Creatinine, Ser: 0.95 mg/dL (ref 0.57–1.00)
GFR calc Af Amer: 89 mL/min/{1.73_m2} (ref >59)
GFR calc non Af Amer: 77 mL/min/{1.73_m2} (ref >59)
Glucose: 281 mg/dL — ABNORMAL HIGH (ref 65–99)
Potassium: 3.9 mmol/L (ref 3.5–5.2)
Sodium: 137 mmol/L (ref 134–144)

## 2020-06-14 LAB — BRAIN NATRIURETIC PEPTIDE: BNP: 734.8 pg/mL — ABNORMAL HIGH (ref 0.0–100.0)

## 2020-06-16 ENCOUNTER — Telehealth: Payer: Self-pay | Admitting: Licensed Clinical Social Worker

## 2020-06-16 ENCOUNTER — Telehealth (HOSPITAL_COMMUNITY): Payer: Self-pay | Admitting: Licensed Clinical Social Worker

## 2020-06-16 NOTE — Progress Notes (Signed)
Heart and Vascular Care Navigation  06/16/2020  Molly Howard 12-14-1985 [id number]  Reason for Referral: Referred to help pt with transportation to and from appts.                                                                                                     Assessment:   CSW called pt to discuss transportation resources to get to and from appts.  Explained [facility] to patient and was able to schedule ride to pt Friday appt with [facility].    Pt then self reported issues with paying for medications.  States she normally gets them free but they were sent to [facility] instead of her normal pharmacy and her sister had to pay over $40 to get them for her which is a big strain on their finances.    Pt reports she has been unable to work the last several months due to her heart condition and that financially this has been very difficult.  Lives with her sister and mom who both have sources of income but this is not enough to support the whole household.  Pt without insurance- has considered applying for disability but doesn't know how to start.                                    HRT/VAS Care Coordination    Patients Home Cardiology Office [facility]   Living arrangements for the past 2 months Single Family Home   Lives with: Siblings; Parents   Patient Current Insurance Coverage Self-Pay   Patient Has Concern With Paying Medical Bills Yes   Patient Concerns With Medical Bills outstanding medical bills and no insurance   Medical Bill Referrals: [facility]   Does Patient Have Prescription Coverage? No   Patient Prescription Assistance Programs Other   Other Assistance Programs Medications gets meds through [facility]   Home Assistive Devices/Equipment None      Social History:                                                                             SDOH Screenings   Alcohol Screen: Not on file  Depression (JJK0-9): Not on file  Financial Resource  Strain: High Risk  . Difficulty of Paying Living Expenses: Hard  Food Insecurity: Food Insecurity Present  . Worried About Programme researcher, broadcasting/film/video in the Last Year: Often true  . Ran Out of Food in the Last Year: Often true  Housing: Medium Risk  . Last Housing Risk Score: 1  Physical Activity: Not on file  Social Connections: Not on file  Stress: Not on file  Tobacco Use: Medium Risk  . Smoking Tobacco Use: Former Smoker  . Smokeless  Tobacco Use: Never Used  Transportation Needs: Unmet Transportation Needs  . Lack of Transportation (Medical): Yes  . Lack of Transportation (Non-Medical): Yes    SDOH Interventions: Financial Resources:  Financial Strain Interventions: Other (Comment) (providing [employer] gift cards to help with medication costs)   Food Insecurity:  Food Insecurity Interventions: Other (Comment) (will help with transportation to get to food pantries as that was the main barrier) Gets food stamps already- about $250/month but states this is not enough for the whole household and that sister and mom can't get food stamps.  Housing Insecurity:  Housing Interventions: Other (Comment) (referred to [facility] to apply for low income water assistance program)  Also just got electric assistance through [facility] to pay for overdue bill.  Transportation:   Transportation Interventions: Retail banker     Follow-up plan:    CSW [employer] provider to send medications to medication management instead of [facility] so that they will get medications for free.  Mailing patient [facility] application to help with past due [facility] bills as well as well as $50 in [employer] giftcards to help offset the cost of medication.  CSW set up transport to appt Friday and informed pt how to set up future rides.  Will continue to follow and assist as needed  Burna Sis, LCSW Clinical Social Worker [facility] Desk#: [phone] Cell#: [phone]

## 2020-06-16 NOTE — Telephone Encounter (Signed)
CSW referred to assist patient with obtaining a scale. Patient to inform scale will be delivered to home. CSW available as needed. Lasandra Beech, LCSW, CCSW-MCS [phone]

## 2020-06-17 ENCOUNTER — Telehealth: Payer: Self-pay | Admitting: *Deleted

## 2020-06-17 NOTE — Telephone Encounter (Signed)
-----   Message from Molly Sorrow, NP sent at 06/16/2020  9:55 AM EST ----- Glucose very elevated, she had not started insulin when seen in clinic - recommend she start. Normal kidney function and electrolytes. Calcium remains low, recommend increased calcium intake. BNP shows excess fluid which is why her Lasix was doubled in clinic next week. Please assess response to increased Lasix dose.

## 2020-06-17 NOTE — Telephone Encounter (Signed)
Attempted to call pt to review results and provider's recc. No answer. Unable to leave vm.

## 2020-06-20 ENCOUNTER — Ambulatory Visit (INDEPENDENT_AMBULATORY_CARE_PROVIDER_SITE_OTHER): Payer: Self-pay | Admitting: Cardiology

## 2020-06-20 ENCOUNTER — Telehealth: Payer: Self-pay | Admitting: Licensed Clinical Social Worker

## 2020-06-20 ENCOUNTER — Encounter: Payer: Self-pay | Admitting: Cardiology

## 2020-06-20 ENCOUNTER — Other Ambulatory Visit: Payer: Self-pay

## 2020-06-20 VITALS — BP 128/86 | HR 112 | Ht 67.0 in | Wt 296.0 lb

## 2020-06-20 DIAGNOSIS — E7800 Pure hypercholesterolemia, unspecified: Secondary | ICD-10-CM

## 2020-06-20 DIAGNOSIS — I428 Other cardiomyopathies: Secondary | ICD-10-CM

## 2020-06-20 MED ORDER — TORSEMIDE 40 MG PO TABS: 40.0000 mg | ORAL_TABLET | Freq: Two times a day (BID) | ORAL | 0 refills | Status: DC

## 2020-06-20 NOTE — Progress Notes (Signed)
Cardiology Office Note:    Date:  06/20/2020   ID:  Molly Howard, DOB [birth year], MRN [id number]  PCP:  Patient, No Pcp Per   [place]  Cardiologist:  [name], MD  Advanced Practice Provider:  No care team member to display Electrophysiologist:  None       Referring MD: No ref. provider found   Chief Complaint  Patient presents with  . Other    1 week follow up - patient c.o SOB. Meds reviewed verbally with patient.     History of Present Illness:    Molly Howard is a 35 y.o. female with a hx of moderately reduced ejection fraction, EF 35 to 40%, diabetes, former smoker x 76yr, presenting for follow-up.  Previously seen by myself in the hospital with shortness of breath and volume overload.    Echocardiogram revealed moderately reduced ejection fraction.  He was managed with diuretics/IV Lasix.  Lexiscan Myoview with no evidence for ischemia.  Patient was not able to take some CHF medications due to cost issues.    Seen in the clinic about a week ago after repeat echocardiogram on 05/20/2020 showed worsening ejection fraction with EF 20 to 25%.  Placed on Coreg, lisinopril, Lasix.  Patient has no insurance as such implementation of Entresto and SGLT2i has been challenging.  Lasix was increased to 80 mg daily, she still feels volume overloaded, has shortness of breath with exertion.  Prior notes Echo 05/20/2020 EF 20 to 25% Echo 10/2019 EF 35 to 40% Lexiscan Myoview 10/2019 no evidence for ischemia, EF 30%  Past Medical History:  Diagnosis Date  . Acid reflux   . [place]   . Obesity     Past Surgical History:  Procedure Laterality Date  . CHOLECYSTECTOMY    . HERNIA REPAIR      Current Medications: Current Meds  Medication Sig  . aspirin EC 81 MG EC tablet Take 1 tablet (81 mg total) by mouth daily.  .Marland Kitchenatorvastatin (LIPITOR) 20 MG tablet Take 1 tablet (20 mg total) by mouth daily.  . blood glucose meter kit and  supplies KIT Dispense based on patient and insurance preference. Use up to four times daily as directed. (FOR ICD-9 250.00, 250.01).  . carvedilol (COREG) 6.25 MG tablet Take 1 tablet (6.25 mg total) by mouth 2 (two) times daily with a meal.  . insulin isophane & regular human (NOVOLIN 70/30 FLEXPEN RELION) (70-30) 100 UNIT/ML KwikPen Inject 10 Units into the skin 2 (two) times daily with a meal.  . Insulin Pen Needle (RELION MINI PEN NEEDLES) 31G X 6 MM MISC 1 Container by Does not apply route in the morning and at bedtime.  . iron polysaccharides (NIFEREX) 150 MG capsule Take 1 capsule (150 mg total) by mouth daily.  .Marland Kitchenlisinopril (ZESTRIL) 5 MG tablet Take 1 tablet (5 mg total) by mouth daily.  . potassium chloride SA (KLOR-CON) 20 MEQ tablet Take 1 tablet (20 mEq total) by mouth daily.  .Marland Kitchentorsemide 40 MG TABS Take 40 mg by mouth 2 (two) times daily.  . [DISCONTINUED] furosemide (LASIX) 80 MG tablet Take 1 tablet (80 mg total) by mouth daily.     Allergies:   No healthtouch food allergies   Social History   Socioeconomic History  . Marital status: Single    Spouse name: Not on file  . Number of children: Not on file  . Years of education: Not on file  . Highest  education level: Not on file  Occupational History  . Not on file  Tobacco Use  . Smoking status: Former Smoker    Packs/day: 0.50    Types: Cigarettes  . Smokeless tobacco: Never Used  Vaping Use  . Vaping Use: Never used  Substance and Sexual Activity  . Alcohol use: Not Currently    Comment: ~ 4 shots/day on weekends only  . Drug use: Not Currently    Types: Cocaine    Comment: Last day of use was prior to June 2021 hosp.visit.   Marland Kitchen Sexual activity: Not on file  Other Topics Concern  . Not on file  Social History Narrative  . Not on file   Social Determinants of Health   Financial Resource Strain: High Risk  . Difficulty of Paying Living Expenses: Hard  Food Insecurity: Food Insecurity Present  . Worried  About Charity fundraiser in the Last Year: Often true  . Ran Out of Food in the Last Year: Often true  Transportation Needs: Unmet Transportation Needs  . Lack of Transportation (Medical): Yes  . Lack of Transportation (Non-Medical): Yes  Physical Activity: Not on file  Stress: Not on file  Social Connections: Not on file     Family History: The patient's family history includes Diabetes in her father and mother; Heart failure in her mother; Hypertension in her father.  ROS:   Please see the history of present illness.     All other systems reviewed and are negative.  EKGs/Labs/Other Studies Reviewed:    The following studies were reviewed today:   EKG:  EKG is  ordered today.  The ekg ordered today demonstrates sinus tachycardia, heart rate 112  Recent Labs: 10/02/2019: TSH 3.286 05/21/2020: ALT 13 05/22/2020: Hemoglobin 9.5; Magnesium 1.6; Platelets 499 06/13/2020: BNP 734.8; BUN 7; Creatinine, Ser 0.95; Potassium 3.9; Sodium 137  Recent Lipid Panel    Component Value Date/Time   CHOL 212 (H) 05/21/2020 0643   TRIG 121 05/21/2020 0643   HDL 35 (L) 05/21/2020 0643   CHOLHDL 6.1 05/21/2020 0643   VLDL 24 05/21/2020 0643   LDLCALC 153 (H) 05/21/2020 0643     Risk Assessment/Calculations:      Physical Exam:    VS:  BP 128/86 (BP Location: Right Arm, Patient Position: Sitting, Cuff Size: Normal)   Pulse (!) 112   Ht _0  (1.702 m)   Wt 296 lb (134.3 kg)   BMI 46.36 kg/m     Wt Readings from Last 3 Encounters:  06/20/20 296 lb (134.3 kg)  06/13/20 294 lb (133.4 kg)  05/19/20 200 lb (90.7 kg)     GEN:  Well nourished, well developed in no acute distress HEENT: Normal NECK: No JVD; No carotid bruits LYMPHATICS: No lymphadenopathy CARDIAC: RRR, no murmurs, rubs, gallops RESPIRATORY: Decreased breath sounds at bases ABDOMEN: Soft, non-tender, distended MUSCULOSKELETAL:  2+ pitting edema; No deformity  SKIN: Warm and dry NEUROLOGIC:  Alert and oriented x  3 PSYCHIATRIC:  Normal affect   ASSESSMENT:    1. NICM (nonischemic cardiomyopathy) ([place])   2. Pure hypercholesterolemia    PLAN:    In order of problems listed above:  1. Nonischemic cardiomyopathy, last EF 25%. Lexiscan Myoview with no evidence for ischemia. Patient describes NYHA class III symptoms. Appears volume overloaded. Stop Lasix, start torsemide 40 mg twice daily. Check BMP in 1 week. Continue Coreg and lisinopril at current doses. Lack of insurance preventing use of Entresto/SGLT2i. Consider addition of Aldactone after adequate  diuresing. 2. Hyperlipidemia, continue statin  Follow-up in 2 weeks.     Medication Adjustments/Labs and Tests Ordered: Current medicines are reviewed at length with the patient today.  Concerns regarding medicines are outlined above.  Orders Placed This Encounter  Procedures  . Basic metabolic panel  . EKG 12-Lead   Meds ordered this encounter  Medications  . torsemide 40 MG TABS    Sig: Take 40 mg by mouth 2 (two) times daily.    Dispense:  180 tablet    Refill:  0    STOP Lasix    Patient Instructions  Medication Instructions:  Your physician has recommended you make the following change in your medication:   1) STOP Lasix (furosemide)  2) START Torsemide 40 mg twice daily (1 tablet in the morning and 1 tablet in the afternoon)  *If you need a refill on your cardiac medications before your next appointment, please call your pharmacy*   Lab Work: Your physician recommends that you return for lab work (bmet) in: 1 week.  Please have your lab drawn at the [facility] medical mall. You do not need an appt. Lab hours are Mon-Fri 7am-6pm.   If you have labs (blood work) drawn today and your tests are completely normal, you will receive your results only by: Marland Kitchen MyChart Message (if you have MyChart) OR . A paper copy in the mail If you have any lab test that is abnormal or we need to change your treatment, we will call you to review the  results.   Testing/Procedures: None ordered   Follow-Up: At [facility], you and your health needs are our priority.  As part of our continuing mission to provide you with exceptional heart care, we have created designated Provider Care Teams.  These Care Teams include your primary Cardiologist (physician) and Advanced Practice Providers (APPs -  Physician Assistants and Nurse Practitioners) who all work together to provide you with the care you need, when you need it.  We recommend signing up for the patient portal called "MyChart".  Sign up information is provided on this After Visit Summary.  MyChart is used to connect with patients for Virtual Visits (Telemedicine).  Patients are able to view lab/test results, encounter notes, upcoming appointments, etc.  Non-urgent messages can be sent to your provider as well.   To learn more about what you can do with MyChart, go to [email or web address].    Your next appointment:   2 week(s)  The format for your next appointment:   In Person  Provider:   [name], MD only   Other Instructions N/A     Signed, [name], MD  06/20/2020 12:47 PM    [place]

## 2020-06-20 NOTE — Telephone Encounter (Signed)
CSW received signed consent to work with [facility] on disability from [facility].  CSW called pt to finish disability referral form so I could submit- phone went straight to VM- left message requesting return call.  Will continue to follow and assist as needed  Burna Sis, LCSW Clinical Social Worker [facility] Desk#: [phone] Cell#: [phone]

## 2020-06-20 NOTE — Patient Instructions (Signed)
Medication Instructions:  Your physician has recommended you make the following change in your medication:   1) STOP Lasix (furosemide)  2) START Torsemide 40 mg twice daily (1 tablet in the morning and 1 tablet in the afternoon)  *If you need a refill on your cardiac medications before your next appointment, please call your pharmacy*   Lab Work: Your physician recommends that you return for lab work (bmet) in: 1 week.  Please have your lab drawn at the [facility] medical mall. You do not need an appt. Lab hours are Mon-Fri 7am-6pm.   If you have labs (blood work) drawn today and your tests are completely normal, you will receive your results only by: Marland Kitchen MyChart Message (if you have MyChart) OR . A paper copy in the mail If you have any lab test that is abnormal or we need to change your treatment, we will call you to review the results.   Testing/Procedures: None ordered   Follow-Up: At [facility], you and your health needs are our priority.  As part of our continuing mission to provide you with exceptional heart care, we have created designated Provider Care Teams.  These Care Teams include your primary Cardiologist (physician) and Advanced Practice Providers (APPs -  Physician Assistants and Nurse Practitioners) who all work together to provide you with the care you need, when you need it.  We recommend signing up for the patient portal called "MyChart".  Sign up information is provided on this After Visit Summary.  MyChart is used to connect with patients for Virtual Visits (Telemedicine).  Patients are able to view lab/test results, encounter notes, upcoming appointments, etc.  Non-urgent messages can be sent to your provider as well.   To learn more about what you can do with MyChart, go to [email or web address].    Your next appointment:   2 week(s)  The format for your next appointment:   In Person  Provider:   Debbe Odea, MD only   Other  Instructions N/A

## 2020-06-23 ENCOUNTER — Telehealth: Payer: Self-pay | Admitting: Licensed Clinical Social Worker

## 2020-06-23 NOTE — Telephone Encounter (Signed)
CSW called pt and was able to speak with her to finalize [facility] disability referral paperwork.  CSW sent in completed paperwork to [facility] for review- they will reach out to patient directly to discuss applying for disability.  Will continue to follow and assist as needed  Burna Sis, LCSW Clinical Social Worker [facility] Desk#: [phone] Cell#: [phone]

## 2020-06-25 NOTE — Telephone Encounter (Signed)
Attempted to call pt. No answer. Lmtcb.  

## 2020-06-30 ENCOUNTER — Telehealth: Payer: Self-pay | Admitting: Licensed Clinical Social Worker

## 2020-06-30 NOTE — Telephone Encounter (Signed)
CSW received call from pt requesting help with transportation to medical appt today as she lost the number for [employer].  CSW provided [facility] number to pt again and encouraged to save the number.  Pt then inquired about medication assistance- states when her mom went to pick up her medications from [facility] they told her she couldn't get more fills until she signed further paperwork.  CSW encouraged pt to call medication management to see what all she needs to complete to continuing getting medications- provided their number to pt to call and inquire.  No further needs at this time  Will continue to follow and assist as needed  Burna Sis, LCSW Clinical Social Worker [facility] Desk#: [phone] Cell#: [phone]

## 2020-07-01 ENCOUNTER — Other Ambulatory Visit
Admission: RE | Admit: 2020-07-01 | Discharge: 2020-07-01 | Disposition: A | Discharge status: Home / Self Care | Payer: Medicaid Other | Attending: Cardiology | Admitting: Cardiology

## 2020-07-01 ENCOUNTER — Telehealth: Payer: Self-pay | Admitting: *Deleted

## 2020-07-01 ENCOUNTER — Encounter: Payer: Self-pay | Admitting: *Deleted

## 2020-07-01 ENCOUNTER — Other Ambulatory Visit: Payer: Self-pay

## 2020-07-01 DIAGNOSIS — I428 Other cardiomyopathies: Secondary | ICD-10-CM

## 2020-07-01 LAB — BASIC METABOLIC PANEL
Anion gap: 12 (ref 5–15)
BUN: 9 mg/dL (ref 6–20)
CO2: 30 mmol/L (ref 22–32)
Calcium: 6.6 mg/dL — ABNORMAL LOW (ref 8.9–10.3)
Chloride: 93 mmol/L — ABNORMAL LOW (ref 98–111)
Creatinine, Ser: 0.88 mg/dL (ref 0.44–1.00)
GFR, Estimated: >60 mL/min (ref >60)
Glucose, Bld: 317 mg/dL — ABNORMAL HIGH (ref 70–99)
Potassium: 3.1 mmol/L — ABNORMAL LOW (ref 3.5–5.1)
Sodium: 135 mmol/L (ref 135–145)

## 2020-07-01 MED ORDER — POTASSIUM CHLORIDE CRYS ER 20 MEQ PO TBCR: EXTENDED_RELEASE_TABLET | ORAL | 3 refills | Status: DC

## 2020-07-01 NOTE — Telephone Encounter (Signed)
Unable to reach patient and letter mailed to patient with results and recommendations.

## 2020-07-01 NOTE — Telephone Encounter (Signed)
-----   Message from Alver Sorrow, NP sent at 07/01/2020  2:39 PM EST ----- Normal kidney function. Potassium of 3.1 likely due to transition from Lasix to Torsemide at last clinic visit. Continue Torsemide 40mg  BID. Recommend Potassium BID x2 days then Potassium BID. Will plan to repeat her BMP at her clinic visit Friday with Dr. Thursday. I have CC'd him as an Azucena Cecil.

## 2020-07-01 NOTE — Telephone Encounter (Signed)
Patient verbalized understanding of results and plan of care.  She is aware to take Potassium BID x2 days then Potassium BID. Med list updated. States she has potassium pills at home. She is aware of upcoming appointment date and time.

## 2020-07-04 ENCOUNTER — Other Ambulatory Visit: Payer: Self-pay

## 2020-07-04 ENCOUNTER — Encounter: Payer: Self-pay | Admitting: Cardiology

## 2020-07-04 ENCOUNTER — Ambulatory Visit (INDEPENDENT_AMBULATORY_CARE_PROVIDER_SITE_OTHER): Payer: Self-pay | Admitting: Cardiology

## 2020-07-04 ENCOUNTER — Other Ambulatory Visit: Payer: Self-pay | Admitting: Cardiology

## 2020-07-04 VITALS — BP 130/92 | HR 108 | Ht 67.0 in | Wt 289.0 lb

## 2020-07-04 DIAGNOSIS — I428 Other cardiomyopathies: Secondary | ICD-10-CM

## 2020-07-04 DIAGNOSIS — E7800 Pure hypercholesterolemia, unspecified: Secondary | ICD-10-CM

## 2020-07-04 MED ORDER — TORSEMIDE 40 MG PO TABS: 40.0000 mg | ORAL_TABLET | Freq: Two times a day (BID) | ORAL | 0 refills | Status: DC

## 2020-07-04 MED ORDER — POTASSIUM CHLORIDE 40 MEQ/15ML (20%) PO SOLN: 40.0000 meq | Freq: Two times a day (BID) | ORAL | 5 refills | Status: DC

## 2020-07-04 NOTE — Progress Notes (Signed)
Cardiology Office Note:    Date:  07/04/2020   ID:  Molly Howard, DOB 03-24-1986, MRN [id number]  PCP:  Patient, No Pcp Per   [place]  Cardiologist:  [name], MD  Advanced Practice Provider:  No care team member to display Electrophysiologist:  None       Referring MD: No ref. provider found   Chief Complaint  Patient presents with  . Other    2 week follow up - Patient c/o SOB at times. Meds reviewed verbally with patient.     History of Present Illness:    Molly Howard is a 35 y.o. female with a hx of moderately reduced ejection fraction, EF 20-25%, diabetes, former smoker x 76yr, obesity presenting for follow-up.    Patient being seen for nonischemic cardiomyopathy.  Noted to be volume overloaded after last visit.  Lasix switched to torsemide.  She still has a lot of fluid in her lower extremities, abdominal distention, fatigue.  Has lost some weight since increasing torsemide dose.   Prior notes Echo 05/20/2020 EF 20 to 25% Echo 10/2019 EF 35 to 40% Lexiscan Myoview 10/2019 no evidence for ischemia, EF 30% Insurance/cost issues preventing use of Entresto, SGLT2i  Past Medical History:  Diagnosis Date  . Acid reflux   . Diabetes mellitus ([place]   . Obesity     Past Surgical History:  Procedure Laterality Date  . CHOLECYSTECTOMY    . HERNIA REPAIR      Current Medications: Current Meds  Medication Sig  . aspirin EC 81 MG EC tablet Take 1 tablet (81 mg total) by mouth daily.  .Marland Kitchenatorvastatin (LIPITOR) 20 MG tablet Take 1 tablet (20 mg total) by mouth daily.  . blood glucose meter kit and supplies KIT Dispense based on patient and insurance preference. Use up to four times daily as directed. (FOR ICD-9 250.00, 250.01).  . carvedilol (COREG) 6.25 MG tablet Take 1 tablet (6.25 mg total) by mouth 2 (two) times daily with a meal.  . insulin isophane & regular human (NOVOLIN 70/30 FLEXPEN RELION) (70-30) 100 UNIT/ML KwikPen Inject 10  Units into the skin 2 (two) times daily with a meal.  . Insulin Pen Needle (RELION MINI PEN NEEDLES) 31G X 6 MM MISC 1 Container by Does not apply route in the morning and at bedtime.  . iron polysaccharides (NIFEREX) 150 MG capsule Take 1 capsule (150 mg total) by mouth daily.  .Marland Kitchenlisinopril (ZESTRIL) 5 MG tablet Take 1 tablet (5 mg total) by mouth daily.  . Potassium Chloride 40 MEQ/15ML (20%) SOLN Take 40 mEq by mouth 2 (two) times daily.  . [DISCONTINUED] potassium chloride SA (KLOR-CON) 20 MEQ tablet Take 2 tablets (40 mEq) by mouth two times a day for 2 days, then take 1 tablet (20 mEq) by mouth two times a day.  . [DISCONTINUED] torsemide 40 MG TABS Take 40 mg by mouth 2 (two) times daily.     Allergies:   No healthtouch food allergies   Social History   Socioeconomic History  . Marital status: Single    Spouse name: Not on file  . Number of children: Not on file  . Years of education: Not on file  . Highest education level: Not on file  Occupational History  . Not on file  Tobacco Use  . Smoking status: Former Smoker    Packs/day: 0.50    Types: Cigarettes  . Smokeless tobacco: Never Used  Vaping Use  .  Vaping Use: Never used  Substance and Sexual Activity  . Alcohol use: Not Currently    Comment: ~ 4 shots/day on weekends only  . Drug use: Not Currently    Types: Cocaine    Comment: Last day of use was prior to June 2021 hosp.visit.   Marland Kitchen Sexual activity: Not on file  Other Topics Concern  . Not on file  Social History Narrative  . Not on file   Social Determinants of Health   Financial Resource Strain: High Risk  . Difficulty of Paying Living Expenses: Hard  Food Insecurity: Food Insecurity Present  . Worried About Charity fundraiser in the Last Year: Often true  . Ran Out of Food in the Last Year: Often true  Transportation Needs: Unmet Transportation Needs  . Lack of Transportation (Medical): Yes  . Lack of Transportation (Non-Medical): Yes  Physical  Activity: Not on file  Stress: Not on file  Social Connections: Not on file     Family History: The patient's family history includes Diabetes in her father and mother; Heart failure in her mother; Hypertension in her father.  ROS:   Please see the history of present illness.     All other systems reviewed and are negative.  EKGs/Labs/Other Studies Reviewed:    The following studies were reviewed today:   EKG:  EKG is  ordered today.  The ekg ordered today demonstrates sinus tachycardia, heart rate 112  Recent Labs: 10/02/2019: TSH 3.286 05/21/2020: ALT 13 05/22/2020: Hemoglobin 9.5; Magnesium 1.6; Platelets 499 06/13/2020: BNP 734.8 07/01/2020: BUN 9; Creatinine, Ser 0.88; Potassium 3.1; Sodium 135  Recent Lipid Panel    Component Value Date/Time   CHOL 212 (H) 05/21/2020 0643   TRIG 121 05/21/2020 0643   HDL 35 (L) 05/21/2020 0643   CHOLHDL 6.1 05/21/2020 0643   VLDL 24 05/21/2020 0643   LDLCALC 153 (H) 05/21/2020 0643     Risk Assessment/Calculations:      Physical Exam:    VS:  BP (!) 130/92 (BP Location: Left Arm, Patient Position: Sitting, Cuff Size: Normal)   Pulse (!) 108   Ht '5\' 7"'  (1.702 m)   Wt 289 lb (131.1 kg)   SpO2 98%   BMI 45.26 kg/m     Wt Readings from Last 3 Encounters:  07/04/20 289 lb (131.1 kg)  06/20/20 296 lb (134.3 kg)  06/13/20 294 lb (133.4 kg)     GEN:  Well nourished, well developed in no acute distress HEENT: Normal NECK: No JVD; No carotid bruits LYMPHATICS: No lymphadenopathy CARDIAC: RRR, no murmurs, rubs, gallops RESPIRATORY: Decreased breath sounds at bases ABDOMEN: Soft, non-tender, distended MUSCULOSKELETAL:  2+ pitting edema; No deformity  SKIN: Warm and dry NEUROLOGIC:  Alert and oriented x 3 PSYCHIATRIC:  Normal affect   ASSESSMENT:    1. NICM (nonischemic cardiomyopathy) ([place])   2. Pure hypercholesterolemia    PLAN:    In order of problems listed above:  1. Nonischemic cardiomyopathy, last EF 25%.  Lexiscan Myoview with no evidence for ischemia. Patient describes NYHA class II-III symptoms. Appears volume overloaded.  Increase torsemide to 80 mg twice daily.  Check BMP today and in 1 week.  Increase KCl to 40 mEq twice daily.  Lack of insurance preventing use of Entresto/SGLT2i. Will Consider addition of Aldactone after adequate diuresing. 2. Hyperlipidemia, continue statin  Follow-up in 2 weeks.     Medication Adjustments/Labs and Tests Ordered: Current medicines are reviewed at length with the patient today.  Concerns  regarding medicines are outlined above.  Orders Placed This Encounter  Procedures  . Basic metabolic panel  . Basic metabolic panel  . Basic Metabolic Panel (BMET)  . EKG 12-Lead   Meds ordered this encounter  Medications  . Torsemide 40 MG TABS    Sig: Take 40 mg by mouth 2 (two) times daily.    Dispense:  180 tablet    Refill:  0    STOP Lasix  . Potassium Chloride 40 MEQ/15ML (20%) SOLN    Sig: Take 40 mEq by mouth 2 (two) times daily.    Dispense:  900 mL    Refill:  5    Patient Instructions  Medication Instructions:  Your physician has recommended you make the following change in your medication:   1.  INCREASE Torsemide to 80 MG twice a day.   2.  Potassium Chloride Take 40 MEQ/15ML by mouth 2 (two) times daily.   *If you need a refill on your cardiac medications before your next appointment, please call your pharmacy*   Lab Work:  BMP to be drawn today.  Your physician recommends that you return for lab work in: in 5 days Wed. 07/09/20  - Please go to the [facility]. You will check in at the front desk to the right as you walk into the atrium. Valet Parking is offered if needed. - No appointment needed. You may go any day between 7 am and 6 pm.    Testing/Procedures: None ordered   Follow-Up: At [facility], you and your health needs are our priority.  As part of our continuing mission to provide you with exceptional heart  care, we have created designated Provider Care Teams.  These Care Teams include your primary Cardiologist (physician) and Advanced Practice Providers (APPs -  Physician Assistants and Nurse Practitioners) who all work together to provide you with the care you need, when you need it.  We recommend signing up for the patient portal called "MyChart".  Sign up information is provided on this After Visit Summary.  MyChart is used to connect with patients for Virtual Visits (Telemedicine).  Patients are able to view lab/test results, encounter notes, upcoming appointments, etc.  Non-urgent messages can be sent to your provider as well.   To learn more about what you can do with MyChart, go to [email or web address].    Your next appointment:   2 week(s)  The format for your next appointment:   In Person  Provider:   [name], MD   Other Instructions      Signed, [name], MD  07/04/2020 1:01 PM    [place]

## 2020-07-04 NOTE — Patient Instructions (Signed)
Medication Instructions:  Your physician has recommended you make the following change in your medication:   1.  INCREASE Torsemide to 80 MG twice a day.   2.  Potassium Chloride Take 40 MEQ/15ML by mouth 2 (two) times daily.   *If you need a refill on your cardiac medications before your next appointment, please call your pharmacy*   Lab Work:  BMP to be drawn today.  Your physician recommends that you return for lab work in: in 5 days Wed. 07/09/20  - Please go to the [facility]. You will check in at the front desk to the right as you walk into the atrium. Valet Parking is offered if needed. - No appointment needed. You may go any day between 7 am and 6 pm.    Testing/Procedures: None ordered   Follow-Up: At [facility], you and your health needs are our priority.  As part of our continuing mission to provide you with exceptional heart care, we have created designated Provider Care Teams.  These Care Teams include your primary Cardiologist (physician) and Advanced Practice Providers (APPs -  Physician Assistants and Nurse Practitioners) who all work together to provide you with the care you need, when you need it.  We recommend signing up for the patient portal called "MyChart".  Sign up information is provided on this After Visit Summary.  MyChart is used to connect with patients for Virtual Visits (Telemedicine).  Patients are able to view lab/test results, encounter notes, upcoming appointments, etc.  Non-urgent messages can be sent to your provider as well.   To learn more about what you can do with MyChart, go to [email or web address].    Your next appointment:   2 week(s)  The format for your next appointment:   In Person  Provider:   Debbe Odea, MD   Other Instructions

## 2020-07-05 LAB — BASIC METABOLIC PANEL
BUN/Creatinine Ratio: 9 (ref 9–23)
BUN: 8 mg/dL (ref 6–20)
CO2: 30 mmol/L — ABNORMAL HIGH (ref 20–29)
Calcium: 6.6 mg/dL — CL (ref 8.7–10.2)
Chloride: 92 mmol/L — ABNORMAL LOW (ref 96–106)
Creatinine, Ser: 0.86 mg/dL (ref 0.57–1.00)
Glucose: 221 mg/dL — ABNORMAL HIGH (ref 65–99)
Potassium: 3.3 mmol/L — ABNORMAL LOW (ref 3.5–5.2)
Sodium: 136 mmol/L (ref 134–144)
eGFR: 90 mL/min/{1.73_m2} (ref >59)

## 2020-07-15 ENCOUNTER — Telehealth: Payer: Self-pay

## 2020-07-15 NOTE — Telephone Encounter (Signed)
Called patient and gave the following result note on 07/08/20. Patient stated that she does not have a PCP to be able to follow up with regarding the low Calcium level.  I just found a possible resource for her to call and find a PCP provider. I left a VM with the information for her, [employer]  [phone]  Result note: Please tell patient to continue taking potassium supplements as prescribed. Patient's calcium levels are low. Have her follow-up with primary care provider regarding taking calcium supplements. Thank you

## 2020-07-18 ENCOUNTER — Other Ambulatory Visit: Payer: Self-pay

## 2020-07-18 ENCOUNTER — Other Ambulatory Visit: Payer: Self-pay | Admitting: Cardiology

## 2020-07-18 ENCOUNTER — Ambulatory Visit (INDEPENDENT_AMBULATORY_CARE_PROVIDER_SITE_OTHER): Payer: Self-pay | Admitting: Cardiology

## 2020-07-18 ENCOUNTER — Encounter: Payer: Self-pay | Admitting: Cardiology

## 2020-07-18 VITALS — BP 128/80 | HR 112 | Ht 67.0 in | Wt 294.0 lb

## 2020-07-18 DIAGNOSIS — I428 Other cardiomyopathies: Secondary | ICD-10-CM

## 2020-07-18 DIAGNOSIS — E7800 Pure hypercholesterolemia, unspecified: Secondary | ICD-10-CM

## 2020-07-18 MED ORDER — METOLAZONE 5 MG PO TABS: 5.0000 mg | ORAL_TABLET | Freq: Every day | ORAL | 5 refills | Status: DC

## 2020-07-18 MED ORDER — TORSEMIDE 40 MG PO TABS: 80.0000 mg | ORAL_TABLET | Freq: Two times a day (BID) | ORAL | 3 refills | Status: DC

## 2020-07-18 NOTE — Patient Instructions (Signed)
Medication Instructions:   Your physician has recommended you make the following change in your medication:      1.  INCREASE TORSEMIDE 80 MG TWICE A DAY.    2.  START TAKING METOLAZONE 5 MG 1 HOUR PRIOR TO YOUR TORSEMIDE.    3.  DISSOLVE your Potassium in a small amount of HOT water and add to applesauce        for easy intake.    *If you need a refill on your cardiac medications before your next appointment, please call your pharmacy*   Lab Work: None ordered  If you have labs (blood work) drawn today and your tests are completely normal, you will receive your results only by: Marland Kitchen MyChart Message (if you have MyChart) OR . A paper copy in the mail If you have any lab test that is abnormal or we need to change your treatment, we will call you to review the results.   Testing/Procedures: None or   Follow-Up: At [facility], you and your health needs are our priority.  As part of our continuing mission to provide you with exceptional heart care, we have created designated Provider Care Teams.  These Care Teams include your primary Cardiologist (physician) and Advanced Practice Providers (APPs -  Physician Assistants and Nurse Practitioners) who all work together to provide you with the care you need, when you need it.  We recommend signing up for the patient portal called "MyChart".  Sign up information is provided on this After Visit Summary.  MyChart is used to connect with patients for Virtual Visits (Telemedicine).  Patients are able to view lab/test results, encounter notes, upcoming appointments, etc.  Non-urgent messages can be sent to your provider as well.   To learn more about what you can do with MyChart, go to [email or web address].    Your next appointment:   Tuesday 07/22/20   The format for your next appointment:   In Person  Provider:   Debbe Odea, MD   Other Instructions

## 2020-07-18 NOTE — Progress Notes (Signed)
Cardiology Office Note:    Date:  07/18/2020   ID:  HAELEE BOLEN, DOB [birth year], MRN [id number]  PCP:  Patient, No Pcp Per   [place]  Cardiologist:  [name], MD  Advanced Practice Provider:  No care team member to display Electrophysiologist:  None       Referring MD: No ref. provider found   Chief Complaint  Patient presents with  . Other    2 week follow up - Patient c.o retaining fluid. Patient c.o blisters on her feet. Meds reviewed verbally with patient.     History of Present Illness:    Molly Howard is a 35 y.o. female with a hx of NICM EF 20-25%, hyperlipidemia, diabetes, former smoker x 61yr, obesity presenting for follow-up.    Patient being seen for nonischemic cardiomyopathy.  Torsemide dose increased after last visit due to edema and abdominal distention.  Unfortunately, prescription did not reflect increased dose of torsemide, so patient has been on torsemide 40 mg twice daily.  Currently taking Coreg and lisinopril.  Lack of insurance/cost issues preventing use of Entresto and SGLT2i.  She has worsening lower extremity edema, abdominal distention, arm swelling.  Prior notes Echo 05/20/2020 EF 20 to 25% Echo 10/2019 EF 35 to 40% Lexiscan Myoview 10/2019 no evidence for ischemia, EF 30% Insurance/cost issues preventing use of Entresto, SGLT2i  Past Medical History:  Diagnosis Date  . Acid reflux   . Diabetes [place]   . Obesity     Past Surgical History:  Procedure Laterality Date  . CHOLECYSTECTOMY    . HERNIA REPAIR      Current Medications: Current Meds  Medication Sig  . aspirin EC 81 MG EC tablet Take 1 tablet (81 mg total) by mouth daily.  .Marland Kitchenatorvastatin (LIPITOR) 20 MG tablet Take 1 tablet (20 mg total) by mouth daily.  . blood glucose meter kit and supplies KIT Dispense based on patient and insurance preference. Use up to four times daily as directed. (FOR ICD-9 250.00, 250.01).  . carvedilol  (COREG) 6.25 MG tablet Take 1 tablet (6.25 mg total) by mouth 2 (two) times daily with a meal.  . insulin isophane & regular human (NOVOLIN 70/30 FLEXPEN RELION) (70-30) 100 UNIT/ML KwikPen Inject 10 Units into the skin 2 (two) times daily with a meal.  . Insulin Pen Needle (RELION MINI PEN NEEDLES) 31G X 6 MM MISC 1 Container by Does not apply route in the morning and at bedtime.  . iron polysaccharides (NIFEREX) 150 MG capsule Take 1 capsule (150 mg total) by mouth daily.  .Marland Kitchenlisinopril (ZESTRIL) 5 MG tablet Take 1 tablet (5 mg total) by mouth daily.  . metolazone (ZAROXOLYN) 5 MG tablet Take 1 tablet (5 mg total) by mouth daily. Take 1 hour prior to your Torsemide.  . Potassium Chloride 40 MEQ/15ML (20%) SOLN Take 40 mEq by mouth 2 (two) times daily.  . [DISCONTINUED] Torsemide 40 MG TABS Take 40 mg by mouth 2 (two) times daily.     Allergies:   No healthtouch food allergies   Social History   Socioeconomic History  . Marital status: Single    Spouse name: Not on file  . Number of children: Not on file  . Years of education: Not on file  . Highest education level: Not on file  Occupational History  . Not on file  Tobacco Use  . Smoking status: Former Smoker    Packs/day: 0.50    Types:  Cigarettes  . Smokeless tobacco: Never Used  Vaping Use  . Vaping Use: Never used  Substance and Sexual Activity  . Alcohol use: Not Currently    Comment: ~ 4 shots/day on weekends only  . Drug use: Not Currently    Types: Cocaine    Comment: Last day of use was prior to June 2021 hosp.visit.   Marland Kitchen Sexual activity: Not on file  Other Topics Concern  . Not on file  Social History Narrative  . Not on file   Social Determinants of Health   Financial Resource Strain: High Risk  . Difficulty of Paying Living Expenses: Hard  Food Insecurity: Food Insecurity Present  . Worried About Charity fundraiser in the Last Year: Often true  . Ran Out of Food in the Last Year: Often true  Transportation  Needs: Unmet Transportation Needs  . Lack of Transportation (Medical): Yes  . Lack of Transportation (Non-Medical): Yes  Physical Activity: Not on file  Stress: Not on file  Social Connections: Not on file     Family History: The patient's family history includes Diabetes in her father and mother; Heart failure in her mother; Hypertension in her father.  ROS:   Please see the history of present illness.     All other systems reviewed and are negative.  EKGs/Labs/Other Studies Reviewed:    The following studies were reviewed today:   EKG:  EKG is  ordered today.  The ekg ordered today demonstrates sinus tachycardia, heart rate 112  Recent Labs: 10/02/2019: TSH 3.286 05/21/2020: ALT 13 05/22/2020: Hemoglobin 9.5; Magnesium 1.6; Platelets 499 06/13/2020: BNP 734.8 07/04/2020: BUN 8; Creatinine, Ser 0.86; Potassium 3.3; Sodium 136  Recent Lipid Panel    Component Value Date/Time   CHOL 212 (H) 05/21/2020 0643   TRIG 121 05/21/2020 0643   HDL 35 (L) 05/21/2020 0643   CHOLHDL 6.1 05/21/2020 0643   VLDL 24 05/21/2020 0643   LDLCALC 153 (H) 05/21/2020 0643     Risk Assessment/Calculations:      Physical Exam:    VS:  BP 128/80 (BP Location: Right Arm, Patient Position: Sitting, Cuff Size: Normal)   Pulse (!) 112   Ht '5\' 7"'  (1.702 m)   Wt 294 lb (133.4 kg)   SpO2 96%   BMI 46.05 kg/m     Wt Readings from Last 3 Encounters:  07/18/20 294 lb (133.4 kg)  07/04/20 289 lb (131.1 kg)  06/20/20 296 lb (134.3 kg)     GEN:  Well nourished, well developed in no acute distress HEENT: Normal NECK: No JVD; No carotid bruits LYMPHATICS: No lymphadenopathy CARDIAC: RRR, no murmurs, rubs, gallops RESPIRATORY: Decreased breath sounds at bases ABDOMEN: Soft, non-tender, distended MUSCULOSKELETAL:  2+ pitting edema; No deformity  SKIN: Warm and dry NEUROLOGIC:  Alert and oriented x 3 PSYCHIATRIC:  Normal affect   ASSESSMENT:    1. NICM (nonischemic cardiomyopathy) ([place])   2.  Pure hypercholesterolemia    PLAN:    In order of problems listed above:  1. Nonischemic cardiomyopathy, last EF 25%. Lexiscan Myoview with no evidence for ischemia. Patient describes NYHA class II-III symptoms. Appears is volume overloaded.  Increase torsemide to 80 mg twice daily.  Start metolazone 5 mg daily to achieve brisk diuresis.  KCl 40 mg twice daily.  Plan for repeat BMP in 4 days, follow-up with myself in 4 days.  Continue Coreg and lisinopril.  Lack of insurance preventing use of Entresto/SGLT2i. Will Consider addition of Aldactone after adequate  diuresing. 2. Hyperlipidemia, continue statin  Follow-up in 2 weeks.     Medication Adjustments/Labs and Tests Ordered: Current medicines are reviewed at length with the patient today.  Concerns regarding medicines are outlined above.  Orders Placed This Encounter  Procedures  . EKG 12-Lead   Meds ordered this encounter  Medications  . metolazone (ZAROXOLYN) 5 MG tablet    Sig: Take 1 tablet (5 mg total) by mouth daily. Take 1 hour prior to your Torsemide.    Dispense:  30 tablet    Refill:  5  . Torsemide 40 MG TABS    Sig: Take 80 mg by mouth 2 (two) times daily. This is 2 tablets twice a day.    Dispense:  360 tablet    Refill:  3    STOP Lasix    Patient Instructions  Medication Instructions:   Your physician has recommended you make the following change in your medication:      1.  INCREASE TORSEMIDE 80 MG TWICE A DAY.    2.  START TAKING METOLAZONE 5 MG 1 HOUR PRIOR TO YOUR TORSEMIDE.    3.  DISSOLVE your Potassium in a small amount of HOT water and add to applesauce        for easy intake.    *If you need a refill on your cardiac medications before your next appointment, please call your pharmacy*   Lab Work: None ordered  If you have labs (blood work) drawn today and your tests are completely normal, you will receive your results only by: Marland Kitchen MyChart Message (if you have MyChart) OR . A paper copy in the  mail If you have any lab test that is abnormal or we need to change your treatment, we will call you to review the results.   Testing/Procedures: None or   Follow-Up: At [facility], you and your health needs are our priority.  As part of our continuing mission to provide you with exceptional heart care, we have created designated Provider Care Teams.  These Care Teams include your primary Cardiologist (physician) and Advanced Practice Providers (APPs -  Physician Assistants and Nurse Practitioners) who all work together to provide you with the care you need, when you need it.  We recommend signing up for the patient portal called "MyChart".  Sign up information is provided on this After Visit Summary.  MyChart is used to connect with patients for Virtual Visits (Telemedicine).  Patients are able to view lab/test results, encounter notes, upcoming appointments, etc.  Non-urgent messages can be sent to your provider as well.   To learn more about what you can do with MyChart, go to [email or web address].    Your next appointment:   Tuesday 07/22/20   The format for your next appointment:   In Person  Provider:   [name], MD   Other Instructions       Signed, [name], MD  07/18/2020 12:48 PM    [place]

## 2020-07-22 ENCOUNTER — Other Ambulatory Visit: Payer: Self-pay

## 2020-07-22 ENCOUNTER — Ambulatory Visit (INDEPENDENT_AMBULATORY_CARE_PROVIDER_SITE_OTHER): Payer: Self-pay | Admitting: Cardiology

## 2020-07-22 ENCOUNTER — Encounter: Payer: Self-pay | Admitting: Cardiology

## 2020-07-22 VITALS — BP 130/90 | HR 98 | Ht 67.0 in | Wt 289.0 lb

## 2020-07-22 DIAGNOSIS — E7800 Pure hypercholesterolemia, unspecified: Secondary | ICD-10-CM

## 2020-07-22 DIAGNOSIS — I428 Other cardiomyopathies: Secondary | ICD-10-CM

## 2020-07-22 MED ORDER — ZOLPIDEM TARTRATE 5 MG PO TABS: 5.0000 mg | ORAL_TABLET | Freq: Every evening | ORAL | 0 refills | Status: DC | PRN

## 2020-07-22 NOTE — Progress Notes (Signed)
Cardiology Office Note:    Date:  07/22/2020   ID:  CAMAYA GANNETT, DOB [birth year], MRN [id number]  PCP:  Patient, No Pcp Per   [place]  Cardiologist:  [name], MD  Advanced Practice Provider:  No care team member to display Electrophysiologist:  None       Referring MD: No ref. provider found   Chief Complaint  Patient presents with  . Other    No complaints today. Meds reviewed verbally with pt.    History of Present Illness:    Molly Howard is a 35 y.o. female with a hx of NICM EF 20-25%, hyperlipidemia, diabetes, former smoker x 43yr, obesity presenting for follow-up.    Patient being seen for nonischemic cardiomyopathy.  Torsemide dose increased after last visit due to edema and abdominal distention.  Metolazone 5 mg daily was added.  She states edema is improving but still present.  Still endorses edema, orthopnea.   Prior notes Echo 05/20/2020 EF 20 to 25% Echo 10/2019 EF 35 to 40% Lexiscan Myoview 10/2019 no evidence for ischemia, EF 30% Insurance/cost issues preventing use of Entresto, SGLT2i Mother has a history of heart failure, has a device placed not sure if pacemaker or ICD.  Past Medical History:  Diagnosis Date  . Acid reflux   . Diabetes mellitus ([place]   . Obesity     Past Surgical History:  Procedure Laterality Date  . CHOLECYSTECTOMY    . HERNIA REPAIR      Current Medications: Current Meds  Medication Sig  . aspirin EC 81 MG EC tablet Take 1 tablet (81 mg total) by mouth daily.  .Marland Kitchenatorvastatin (LIPITOR) 20 MG tablet Take 1 tablet (20 mg total) by mouth daily.  . blood glucose meter kit and supplies KIT Dispense based on patient and insurance preference. Use up to four times daily as directed. (FOR ICD-9 250.00, 250.01).  . carvedilol (COREG) 6.25 MG tablet Take 1 tablet (6.25 mg total) by mouth 2 (two) times daily with a meal.  . insulin isophane & regular human (NOVOLIN 70/30 FLEXPEN RELION) (70-30) 100  UNIT/ML KwikPen Inject 10 Units into the skin 2 (two) times daily with a meal.  . Insulin Pen Needle (RELION MINI PEN NEEDLES) 31G X 6 MM MISC 1 Container by Does not apply route in the morning and at bedtime.  . iron polysaccharides (NIFEREX) 150 MG capsule Take 1 capsule (150 mg total) by mouth daily.  .Marland Kitchenlisinopril (ZESTRIL) 5 MG tablet Take 1 tablet (5 mg total) by mouth daily.  . metolazone (ZAROXOLYN) 5 MG tablet Take 1 tablet (5 mg total) by mouth daily. Take 1 hour prior to your Torsemide.  . Potassium Chloride 40 MEQ/15ML (20%) SOLN Take 40 mEq by mouth 2 (two) times daily.  . Torsemide 40 MG TABS Take 80 mg by mouth 2 (two) times daily. This is 2 tablets twice a day.  . zolpidem (AMBIEN) 5 MG tablet Take 1 tablet (5 mg total) by mouth at bedtime as needed for up to 14 days for sleep.     Allergies:   No healthtouch food allergies   Social History   Socioeconomic History  . Marital status: Single    Spouse name: Not on file  . Number of children: Not on file  . Years of education: Not on file  . Highest education level: Not on file  Occupational History  . Not on file  Tobacco Use  . Smoking status: Former  Smoker    Packs/day: 0.50    Types: Cigarettes  . Smokeless tobacco: Never Used  Vaping Use  . Vaping Use: Never used  Substance and Sexual Activity  . Alcohol use: Not Currently    Comment: ~ 4 shots/day on weekends only  . Drug use: Not Currently    Types: Cocaine    Comment: Last day of use was prior to June 2021 hosp.visit.   Marland Kitchen Sexual activity: Not on file  Other Topics Concern  . Not on file  Social History Narrative  . Not on file   Social Determinants of Health   Financial Resource Strain: High Risk  . Difficulty of Paying Living Expenses: Hard  Food Insecurity: Food Insecurity Present  . Worried About Charity fundraiser in the Last Year: Often true  . Ran Out of Food in the Last Year: Often true  Transportation Needs: Unmet Transportation Needs  .  Lack of Transportation (Medical): Yes  . Lack of Transportation (Non-Medical): Yes  Physical Activity: Not on file  Stress: Not on file  Social Connections: Not on file     Family History: The patient's family history includes Diabetes in her father and mother; Heart failure in her mother; Hypertension in her father.  ROS:   Please see the history of present illness.     All other systems reviewed and are negative.  EKGs/Labs/Other Studies Reviewed:    The following studies were reviewed today:   EKG:  EKG is  ordered today.  The ekg ordered today demonstrates sinus rhythm, low QRS voltage.  Recent Labs: 10/02/2019: TSH 3.286 05/21/2020: ALT 13 05/22/2020: Hemoglobin 9.5; Magnesium 1.6; Platelets 499 06/13/2020: BNP 734.8 07/04/2020: BUN 8; Creatinine, Ser 0.86; Potassium 3.3; Sodium 136  Recent Lipid Panel    Component Value Date/Time   CHOL 212 (H) 05/21/2020 0643   TRIG 121 05/21/2020 0643   HDL 35 (L) 05/21/2020 0643   CHOLHDL 6.1 05/21/2020 0643   VLDL 24 05/21/2020 0643   LDLCALC 153 (H) 05/21/2020 0643     Risk Assessment/Calculations:      Physical Exam:    VS:  BP 130/90 (BP Location: Left Arm, Patient Position: Sitting, Cuff Size: Large)   Pulse 98   Ht '5\' 7"'  (1.702 m)   Wt 289 lb (131.1 kg)   SpO2 98%   BMI 45.26 kg/m     Wt Readings from Last 3 Encounters:  07/22/20 289 lb (131.1 kg)  07/18/20 294 lb (133.4 kg)  07/04/20 289 lb (131.1 kg)     GEN:  Well nourished, well developed in no acute distress HEENT: Normal NECK: No JVD; No carotid bruits LYMPHATICS: No lymphadenopathy CARDIAC: RRR, no murmurs, rubs, gallops RESPIRATORY: Decreased breath sounds at bases ABDOMEN: Soft, non-tender, distended MUSCULOSKELETAL:  2+ pitting edema; No deformity  SKIN: Warm and dry NEUROLOGIC:  Alert and oriented x 3 PSYCHIATRIC:  Normal affect   ASSESSMENT:    1. NICM (nonischemic cardiomyopathy) ([place])   2. Pure hypercholesterolemia    PLAN:    In  order of problems listed above:  1. Nonischemic cardiomyopathy, last EF 25%. Lexiscan Myoview with no evidence for ischemia. Patient describes NYHA class II-III symptoms.  She is still volume overloaded but improving.  continue torsemide 80 mg twice daily. metolazone 5 mg daily.  KCl 40 mg twice daily. Check bmp today.  Lack of insurance preventing use of Entresto/SGLT2i. Will Consider addition of Aldactone after adequate diuresing.  Etiology of heart failure could be Herr[place] tracings  mother has heart failure with pacemaker/ICD placed.  EKG today showing low voltage.  Echo with normal wall thickness.?  Infiltrative disease.  Cardiac MRI after adequate diuresing. 2. Hyperlipidemia, continue statin  Follow-up in 2 weeks.     Medication Adjustments/Labs and Tests Ordered: Current medicines are reviewed at length with the patient today.  Concerns regarding medicines are outlined above.  Orders Placed This Encounter  Procedures  . Basic metabolic panel  . EKG 12-Lead   Meds ordered this encounter  Medications  . zolpidem (AMBIEN) 5 MG tablet    Sig: Take 1 tablet (5 mg total) by mouth at bedtime as needed for up to 14 days for sleep.    Dispense:  14 tablet    Refill:  0    Patient Instructions  Medication Instructions:   Your physician has recommended you make the following change in your medication:   1. Take Ambien 5 MG before bedtime once a day for 2 weeks.  *If you need a refill on your cardiac medications before your next appointment, please call your pharmacy*   Lab Work: None ordered If you have labs (blood work) drawn today and your tests are completely normal, you will receive your results only by: Marland Kitchen MyChart Message (if you have MyChart) OR . A paper copy in the mail If you have any lab test that is abnormal or we need to change your treatment, we will call you to review the results.   Testing/Procedures: None ordered   Follow-Up: At [facility], you and your  health needs are our priority.  As part of our continuing mission to provide you with exceptional heart care, we have created designated Provider Care Teams.  These Care Teams include your primary Cardiologist (physician) and Advanced Practice Providers (APPs -  Physician Assistants and Nurse Practitioners) who all work together to provide you with the care you need, when you need it.  We recommend signing up for the patient portal called "MyChart".  Sign up information is provided on this After Visit Summary.  MyChart is used to connect with patients for Virtual Visits (Telemedicine).  Patients are able to view lab/test results, encounter notes, upcoming appointments, etc.  Non-urgent messages can be sent to your provider as well.   To learn more about what you can do with MyChart, go to [email or web address].    Your next appointment:   2 week(s)  The format for your next appointment:   In Person  Provider:   [name], MD   Other Instructions      Signed, [name], MD  07/22/2020 10:07 AM    [place]

## 2020-07-22 NOTE — Patient Instructions (Signed)
Medication Instructions:   Your physician has recommended you make the following change in your medication:   1. Take Ambien 5 MG before bedtime once a day for 2 weeks.  *If you need a refill on your cardiac medications before your next appointment, please call your pharmacy*   Lab Work: None ordered If you have labs (blood work) drawn today and your tests are completely normal, you will receive your results only by: Marland Kitchen MyChart Message (if you have MyChart) OR . A paper copy in the mail If you have any lab test that is abnormal or we need to change your treatment, we will call you to review the results.   Testing/Procedures: None ordered   Follow-Up: At [facility], you and your health needs are our priority.  As part of our continuing mission to provide you with exceptional heart care, we have created designated Provider Care Teams.  These Care Teams include your primary Cardiologist (physician) and Advanced Practice Providers (APPs -  Physician Assistants and Nurse Practitioners) who all work together to provide you with the care you need, when you need it.  We recommend signing up for the patient portal called "MyChart".  Sign up information is provided on this After Visit Summary.  MyChart is used to connect with patients for Virtual Visits (Telemedicine).  Patients are able to view lab/test results, encounter notes, upcoming appointments, etc.  Non-urgent messages can be sent to your provider as well.   To learn more about what you can do with MyChart, go to [email or web address].    Your next appointment:   2 week(s)  The format for your next appointment:   In Person  Provider:   Debbe Odea, MD   Other Instructions

## 2020-07-23 LAB — BASIC METABOLIC PANEL
BUN/Creatinine Ratio: 14 (ref 9–23)
BUN: 12 mg/dL (ref 6–20)
CO2: 34 mmol/L — ABNORMAL HIGH (ref 20–29)
Calcium: 7.2 mg/dL — ABNORMAL LOW (ref 8.7–10.2)
Chloride: 91 mmol/L — ABNORMAL LOW (ref 96–106)
Creatinine, Ser: 0.84 mg/dL (ref 0.57–1.00)
Glucose: 307 mg/dL — ABNORMAL HIGH (ref 65–99)
Potassium: 3.7 mmol/L (ref 3.5–5.2)
Sodium: 138 mmol/L (ref 134–144)
eGFR: 92 mL/min/{1.73_m2} (ref >59)

## 2020-08-04 ENCOUNTER — Other Ambulatory Visit: Payer: Self-pay

## 2020-08-04 ENCOUNTER — Ambulatory Visit (INDEPENDENT_AMBULATORY_CARE_PROVIDER_SITE_OTHER): Payer: Self-pay | Admitting: Cardiology

## 2020-08-04 ENCOUNTER — Encounter: Payer: Self-pay | Admitting: Cardiology

## 2020-08-04 VITALS — BP 130/90 | HR 105 | Ht 67.0 in | Wt 266.0 lb

## 2020-08-04 DIAGNOSIS — E7800 Pure hypercholesterolemia, unspecified: Secondary | ICD-10-CM

## 2020-08-04 DIAGNOSIS — I428 Other cardiomyopathies: Secondary | ICD-10-CM

## 2020-08-04 NOTE — Progress Notes (Signed)
Cardiology Office Note:    Date:  08/04/2020   ID:  Molly Howard, DOB 07-20-1985, MRN [id number]  PCP:  Patient, No Pcp Per (Inactive)   [place]  Cardiologist:  [name], MD  Advanced Practice Provider:  No care team member to display Electrophysiologist:  None       Referring MD: No ref. provider found   Chief Complaint  Patient presents with  . Other    2 week follow up - Patient states her SON has improved some. Meds reviewed verbally with patient.     History of Present Illness:    Molly Howard is a 35 y.o. female with a hx of NICM EF 20-25%, hyperlipidemia, diabetes, former smoker x 23yr, obesity presenting for follow-up.    Patient being seen for nonischemic cardiomyopathy.  Torsemide dose increased after last visit due to edema and abdominal distention.  Metolazone 5 mg daily was added.  High edema has improved, lost about 23 pounds over the past 2 weeks.  States not taking potassium supplements as prescribed.  She is otherwise compliant with her other medicines.  Still endorses shortness of breath with exertion.   Prior notes Echo 05/20/2020 EF 20 to 25% Echo 10/2019 EF 35 to 40% Lexiscan Myoview 10/2019 no evidence for ischemia, EF 30% Insurance/cost issues preventing use of Entresto, SGLT2i Mother has a history of heart failure, has a device placed not sure if pacemaker or ICD.  Past Medical History:  Diagnosis Date  . Acid reflux   . Diabetes mellitus ([place]   . Obesity     Past Surgical History:  Procedure Laterality Date  . CHOLECYSTECTOMY    . HERNIA REPAIR      Current Medications: Current Meds  Medication Sig  . aspirin EC 81 MG EC tablet Take 1 tablet (81 mg total) by mouth daily.  .Marland Kitchenatorvastatin (LIPITOR) 20 MG tablet TAKE ONE TABLET BY MOUTH EVERY DAY  . BD VEO INSULIN SYRINGE U/F 31G X 15/64" 0.3 ML MISC USE AS DIRECTED WITH INSULIN (Patient taking differently: See admin instructions. with insulin)  . blood  glucose meter kit and supplies KIT Dispense based on patient and insurance preference. Use up to four times daily as directed. (FOR ICD-9 250.00, 250.01).  . carvedilol (COREG) 6.25 MG tablet TAKE ONE TABLET BY MOUTH 2 TIMES A DAY WITH MEALS  . insulin aspart protamine- aspart (NOVOLOG MIX 70/30) (70-30) 100 UNIT/ML injection INJECT 10 UNITS INTO THE SKIN 2 TIMES A DAY WITH MEALS  . insulin isophane & regular human (NOVOLIN 70/30 FLEXPEN RELION) (70-30) 100 UNIT/ML KwikPen Inject 10 Units into the skin 2 (two) times daily with a meal.  . Insulin Pen Needle (RELION MINI PEN NEEDLES) 31G X 6 MM MISC 1 Container by Does not apply route in the morning and at bedtime.  . iron polysaccharides (NIFEREX) 150 MG capsule TAKE ONE CAPSULE BY MOUTH EVERY DAY  . lisinopril (ZESTRIL) 5 MG tablet TAKE ONE TABLET BY MOUTH EVERY DAY  . metolazone (ZAROXOLYN) 5 MG tablet TAKE ONE TABLET BY MOUTH EVERY DAY 1 HR PRIOR TO TAKING TORSEMIDE  . Potassium Chloride 40 MEQ/15ML (20%) SOLN Take 40 mEq by mouth 2 (two) times daily.  . potassium chloride SA (KLOR-CON) 20 MEQ tablet TAKE 2 TABLETS (40 MEQ TOTAL) BY MOUTH TWICE DAILY.  .Marland Kitchentorsemide (DEMADEX) 20 MG tablet TAKE 4 TABLETS (80 MG) BY MOUTH 2 TIMES A DAY. STOP TAKING LASIX/FUROSEMIDE.  .Marland KitchenTorsemide 40 MG TABS  Take 80 mg by mouth 2 (two) times daily. This is 2 tablets twice a day.  . zolpidem (AMBIEN) 5 MG tablet Take 1 tablet (5 mg total) by mouth at bedtime as needed for up to 14 days for sleep.     Allergies:   No healthtouch food allergies   Social History   Socioeconomic History  . Marital status: Single    Spouse name: Not on file  . Number of children: Not on file  . Years of education: Not on file  . Highest education level: Not on file  Occupational History  . Not on file  Tobacco Use  . Smoking status: Former Smoker    Packs/day: 0.50    Types: Cigarettes  . Smokeless tobacco: Never Used  Vaping Use  . Vaping Use: Never used  Substance and  Sexual Activity  . Alcohol use: Not Currently    Comment: ~ 4 shots/day on weekends only  . Drug use: Not Currently    Types: Cocaine    Comment: Last day of use was prior to June 2021 hosp.visit.   Marland Kitchen Sexual activity: Not on file  Other Topics Concern  . Not on file  Social History Narrative  . Not on file   Social Determinants of Health   Financial Resource Strain: High Risk  . Difficulty of Paying Living Expenses: Hard  Food Insecurity: Food Insecurity Present  . Worried About Charity fundraiser in the Last Year: Often true  . Ran Out of Food in the Last Year: Often true  Transportation Needs: Unmet Transportation Needs  . Lack of Transportation (Medical): Yes  . Lack of Transportation (Non-Medical): Yes  Physical Activity: Not on file  Stress: Not on file  Social Connections: Not on file     Family History: The patient's family history includes Diabetes in her father and mother; Heart failure in her mother; Hypertension in her father.  ROS:   Please see the history of present illness.     All other systems reviewed and are negative.  EKGs/Labs/Other Studies Reviewed:    The following studies were reviewed today:   EKG:  EKG is  ordered today.  The ekg ordered today demonstrates sinus tachycardia, low voltage QRS .  Recent Labs: 10/02/2019: TSH 3.286 05/21/2020: ALT 13 05/22/2020: Hemoglobin 9.5; Magnesium 1.6; Platelets 499 06/13/2020: BNP 734.8 07/22/2020: BUN 12; Creatinine, Ser 0.84; Potassium 3.7; Sodium 138  Recent Lipid Panel    Component Value Date/Time   CHOL 212 (H) 05/21/2020 0643   TRIG 121 05/21/2020 0643   HDL 35 (L) 05/21/2020 0643   CHOLHDL 6.1 05/21/2020 0643   VLDL 24 05/21/2020 0643   LDLCALC 153 (H) 05/21/2020 0643     Risk Assessment/Calculations:      Physical Exam:    VS:  BP 130/90 (BP Location: Right Arm, Patient Position: Sitting, Cuff Size: Normal)   Pulse (!) 105   Ht '5\' 7"'  (1.702 m)   Wt 266 lb (120.7 kg)   SpO2 96%   BMI  41.66 kg/m     Wt Readings from Last 3 Encounters:  08/04/20 266 lb (120.7 kg)  07/22/20 289 lb (131.1 kg)  07/18/20 294 lb (133.4 kg)     GEN:  Well nourished, well developed in no acute distress HEENT: Normal NECK: No JVD; No carotid bruits LYMPHATICS: No lymphadenopathy CARDIAC: RRR, no murmurs, rubs, gallops RESPIRATORY: Decreased breath sounds at bases ABDOMEN: Soft, non-tender, distended MUSCULOSKELETAL:  2+ edema; No deformity  SKIN: Warm  and dry NEUROLOGIC:  Alert and oriented x 3 PSYCHIATRIC:  Normal affect   ASSESSMENT:    1. NICM (nonischemic cardiomyopathy) ([place])   2. Pure hypercholesterolemia    PLAN:    In order of problems listed above:  1. Nonischemic cardiomyopathy, last EF 25%. Lexiscan Myoview with no evidence for ischemia. Patient describes NYHA class II-III symptoms.  She is still volume overloaded but improving.  continue torsemide 80 mg twice daily. metolazone 5 mg daily.  Encouraged to take KCl 40 mg twice daily as prescribed. Check bmp today.  Lack of insurance preventing use of Entresto/SGLT2i. Will Consider addition of Aldactone after adequate diuresing.  Etiology of heart failure could be heriditary, since mother has heart failure with pacemaker/ICD placed vs infiltrative due to ekg with low voltage.  Echo with normal wall thickness. 2. Hyperlipidemia, continue statin  Follow-up in 1 month     Medication Adjustments/Labs and Tests Ordered: Current medicines are reviewed at length with the patient today.  Concerns regarding medicines are outlined above.  Orders Placed This Encounter  Procedures  . Basic metabolic panel  . EKG 12-Lead   No orders of the defined types were placed in this encounter.   Patient Instructions  Medication Instructions:  Your physician recommends that you continue on your current medications as directed. Please refer to the Current Medication list given to you today.  *If you need a refill on your cardiac  medications before your next appointment, please call your pharmacy*   Lab Work: BMP to be drawn today.   Testing/Procedures: None ordered   Follow-Up: At [facility], you and your health needs are our priority.  As part of our continuing mission to provide you with exceptional heart care, we have created designated Provider Care Teams.  These Care Teams include your primary Cardiologist (physician) and Advanced Practice Providers (APPs -  Physician Assistants and Nurse Practitioners) who all work together to provide you with the care you need, when you need it.  We recommend signing up for the patient portal called "MyChart".  Sign up information is provided on this After Visit Summary.  MyChart is used to connect with patients for Virtual Visits (Telemedicine).  Patients are able to view lab/test results, encounter notes, upcoming appointments, etc.  Non-urgent messages can be sent to your provider as well.   To learn more about what you can do with MyChart, go to [email or web address].    Your next appointment:   1 month(s)  The format for your next appointment:   In Person  Provider:   [name], MD   Other Instructions       Signed, [name], MD  08/04/2020 4:57 PM    [place]

## 2020-08-04 NOTE — Patient Instructions (Signed)
Medication Instructions:  Your physician recommends that you continue on your current medications as directed. Please refer to the Current Medication list given to you today.  *If you need a refill on your cardiac medications before your next appointment, please call your pharmacy*   Lab Work: BMP to be drawn today.   Testing/Procedures: None ordered   Follow-Up: At [facility], you and your health needs are our priority.  As part of our continuing mission to provide you with exceptional heart care, we have created designated Provider Care Teams.  These Care Teams include your primary Cardiologist (physician) and Advanced Practice Providers (APPs -  Physician Assistants and Nurse Practitioners) who all work together to provide you with the care you need, when you need it.  We recommend signing up for the patient portal called "MyChart".  Sign up information is provided on this After Visit Summary.  MyChart is used to connect with patients for Virtual Visits (Telemedicine).  Patients are able to view lab/test results, encounter notes, upcoming appointments, etc.  Non-urgent messages can be sent to your provider as well.   To learn more about what you can do with MyChart, go to [email or web address].    Your next appointment:   1 month(s)  The format for your next appointment:   In Person  Provider:   Debbe Odea, MD   Other Instructions

## 2020-08-05 ENCOUNTER — Telehealth: Payer: Self-pay | Admitting: Internal Medicine

## 2020-08-05 LAB — BASIC METABOLIC PANEL
BUN/Creatinine Ratio: 11 (ref 9–23)
BUN: 9 mg/dL (ref 6–20)
CO2: 36 mmol/L — ABNORMAL HIGH (ref 20–29)
Calcium: 6.4 mg/dL — CL (ref 8.7–10.2)
Chloride: 84 mmol/L — ABNORMAL LOW (ref 96–106)
Creatinine, Ser: 0.83 mg/dL (ref 0.57–1.00)
Glucose: 237 mg/dL — ABNORMAL HIGH (ref 65–99)
Potassium: 3.6 mmol/L (ref 3.5–5.2)
Sodium: 135 mmol/L (ref 134–144)
eGFR: 94 mL/min/{1.73_m2} (ref >59)

## 2020-08-05 NOTE — Telephone Encounter (Signed)
Received a call from [employer] regarding a low calcium level of 6.4. On review of her records, she has had chronically low calcium and is taking calcium supplements for this. Will update Dr. Azucena Cecil on the results.

## 2020-08-06 ENCOUNTER — Other Ambulatory Visit: Payer: Self-pay

## 2020-08-06 ENCOUNTER — Telehealth: Payer: Self-pay

## 2020-08-06 MED ORDER — CALCIUM CARBONATE 600 MG PO TABS
600.0000 mg | ORAL_TABLET | Freq: Two times a day (BID) | ORAL | 3 refills | Status: DC
  Filled 2020-08-06: qty 60, 30d supply, fill #0

## 2020-08-06 NOTE — Telephone Encounter (Signed)
Called patient to give BMP result note. Sent prescription for Calcium supplement to patients pharmacy per discussion with Dr. Azucena Cecil as patient had not been taking any and does not have a PCP yet. Patient stated that she will call the number we had given her previously and try and find a PCP.

## 2020-08-07 ENCOUNTER — Other Ambulatory Visit: Payer: Self-pay

## 2020-08-13 ENCOUNTER — Other Ambulatory Visit (HOSPITAL_COMMUNITY): Payer: Self-pay

## 2020-09-05 ENCOUNTER — Ambulatory Visit (INDEPENDENT_AMBULATORY_CARE_PROVIDER_SITE_OTHER): Payer: Self-pay | Admitting: Cardiology

## 2020-09-05 ENCOUNTER — Encounter: Payer: Self-pay | Admitting: Cardiology

## 2020-09-05 ENCOUNTER — Other Ambulatory Visit: Payer: Self-pay

## 2020-09-05 VITALS — BP 122/74 | HR 109 | Ht 67.0 in | Wt 264.2 lb

## 2020-09-05 DIAGNOSIS — E7800 Pure hypercholesterolemia, unspecified: Secondary | ICD-10-CM

## 2020-09-05 DIAGNOSIS — I428 Other cardiomyopathies: Secondary | ICD-10-CM

## 2020-09-05 NOTE — Progress Notes (Signed)
Cardiology Office Note:    Date:  09/05/2020   ID:  Molly Howard, DOB [birth year], MRN [id number]  PCP:  Patient, No Pcp Per (Inactive)   [place]  Cardiologist:  [name], MD  Advanced Practice Provider:  No care team member to display Electrophysiologist:  None       Referring MD: No ref. provider found   No chief complaint on file.   History of Present Illness:    Molly Howard is a 35 y.o. female with a hx of NICM EF 20-25%, hyperlipidemia, diabetes, former smoker x 66yr, obesity presenting for follow-up.    Patient being seen for nonischemic cardiomyopathy.  Symptoms of edema improving with torsemide and metolazone.  Swelling is improved, she still has shortness of breath with exertion and abdominal distention.  States not having her period/menses over the past 4 months.   Prior notes Echo 05/20/2020 EF 20 to 25% Echo 10/2019 EF 35 to 40% Lexiscan Myoview 10/2019 no evidence for ischemia, EF 30% Insurance/cost issues preventing use of Entresto, SGLT2i Mother has a history of heart failure, has a device placed not sure if pacemaker or ICD.  Past Medical History:  Diagnosis Date  . Acid reflux   . Diabetes mellitus ([place]   . Obesity     Past Surgical History:  Procedure Laterality Date  . CHOLECYSTECTOMY    . HERNIA REPAIR      Current Medications: Current Meds  Medication Sig  . aspirin EC 81 MG EC tablet Take 1 tablet (81 mg total) by mouth daily.  .Marland Kitchenatorvastatin (LIPITOR) 20 MG tablet TAKE ONE TABLET BY MOUTH EVERY DAY  . BD VEO INSULIN SYRINGE U/F 31G X 15/64" 0.3 ML MISC USE AS DIRECTED WITH INSULIN (Patient taking differently: See admin instructions. with insulin)  . blood glucose meter kit and supplies KIT Dispense based on patient and insurance preference. Use up to four times daily as directed. (FOR ICD-9 250.00, 250.01).  . calcium carbonate (OS-CAL) 600 MG TABS tablet Take 1 tablet (600 mg total) by mouth 2 (two) times  daily with a meal.  . carvedilol (COREG) 6.25 MG tablet TAKE ONE TABLET BY MOUTH 2 TIMES A DAY WITH MEALS  . insulin aspart protamine- aspart (NOVOLOG MIX 70/30) (70-30) 100 UNIT/ML injection INJECT 10 UNITS INTO THE SKIN 2 TIMES A DAY WITH MEALS  . Insulin Pen Needle (RELION MINI PEN NEEDLES) 31G X 6 MM MISC 1 Container by Does not apply route in the morning and at bedtime.  . iron polysaccharides (NIFEREX) 150 MG capsule TAKE ONE CAPSULE BY MOUTH EVERY DAY  . lisinopril (ZESTRIL) 5 MG tablet TAKE ONE TABLET BY MOUTH EVERY DAY  . metolazone (ZAROXOLYN) 5 MG tablet TAKE ONE TABLET BY MOUTH EVERY DAY 1 HR PRIOR TO TAKING TORSEMIDE  . Potassium Chloride 40 MEQ/15ML (20%) SOLN Take 40 mEq by mouth 2 (two) times daily.  . potassium chloride SA (KLOR-CON) 20 MEQ tablet TAKE 2 TABLETS (40 MEQ TOTAL) BY MOUTH TWICE DAILY.  .Marland Kitchentorsemide (DEMADEX) 20 MG tablet TAKE 4 TABLETS (80 MG) BY MOUTH 2 TIMES A DAY. STOP TAKING LASIX/FUROSEMIDE.  .Marland KitchenTorsemide 40 MG TABS Take 80 mg by mouth 2 (two) times daily. This is 2 tablets twice a day.     Allergies:   No healthtouch food allergies   Social History   Socioeconomic History  . Marital status: Single    Spouse name: Not on file  . Number of children: Not  on file  . Years of education: Not on file  . Highest education level: Not on file  Occupational History  . Not on file  Tobacco Use  . Smoking status: Former Smoker    Packs/day: 0.50    Types: Cigarettes  . Smokeless tobacco: Never Used  Vaping Use  . Vaping Use: Never used  Substance and Sexual Activity  . Alcohol use: Not Currently    Comment: ~ 4 shots/day on weekends only  . Drug use: Not Currently    Types: Cocaine    Comment: Last day of use was prior to June 2021 hosp.visit.   . Sexual activity: Not on file  Other Topics Concern  . Not on file  Social History Narrative  . Not on file   Social Determinants of Health   Financial Resource Strain: High Risk  . Difficulty of Paying  Living Expenses: Hard  Food Insecurity: Food Insecurity Present  . Worried About Running Out of Food in the Last Year: Often true  . Ran Out of Food in the Last Year: Often true  Transportation Needs: Unmet Transportation Needs  . Lack of Transportation (Medical): Yes  . Lack of Transportation (Non-Medical): Yes  Physical Activity: Not on file  Stress: Not on file  Social Connections: Not on file     Family History: The patient's family history includes Diabetes in her father and mother; Heart failure in her mother; Hypertension in her father.  ROS:   Please see the history of present illness.     All other systems reviewed and are negative.  EKGs/Labs/Other Studies Reviewed:    The following studies were reviewed today:   EKG:  EKG is  ordered today.  The ekg ordered today demonstrates sinus tachycardia, heart rate 109.  Recent Labs: 10/02/2019: TSH 3.286 05/21/2020: ALT 13 05/22/2020: Hemoglobin 9.5; Magnesium 1.6; Platelets 499 06/13/2020: BNP 734.8 08/04/2020: BUN 9; Creatinine, Ser 0.83; Potassium 3.6; Sodium 135  Recent Lipid Panel    Component Value Date/Time   CHOL 212 (H) 05/21/2020 0643   TRIG 121 05/21/2020 0643   HDL 35 (L) 05/21/2020 0643   CHOLHDL 6.1 05/21/2020 0643   VLDL 24 05/21/2020 0643   LDLCALC 153 (H) 05/21/2020 0643     Risk Assessment/Calculations:      Physical Exam:    VS:  BP 122/74   Pulse (!) 109   Ht 5' 7" (1.702 m)   Wt 264 lb 3.2 oz (119.8 kg)   SpO2 99%   BMI 41.38 kg/m     Wt Readings from Last 3 Encounters:  09/05/20 264 lb 3.2 oz (119.8 kg)  08/04/20 266 lb (120.7 kg)  07/22/20 289 lb (131.1 kg)     GEN:  Well nourished, well developed in no acute distress HEENT: Normal NECK: No JVD; No carotid bruits LYMPHATICS: No lymphadenopathy CARDIAC: RRR, no murmurs, rubs, gallops RESPIRATORY: Decreased breath sounds at bases ABDOMEN: Soft, non-tender, distended MUSCULOSKELETAL:  1+ edema; No deformity  SKIN: Warm and  dry NEUROLOGIC:  Alert and oriented x 3 PSYCHIATRIC:  Normal affect   ASSESSMENT:    1. NICM (nonischemic cardiomyopathy) ([facility])   2. Pure hypercholesterolemia    PLAN:    In order of problems listed above:  1. Nonischemic cardiomyopathy,  EF 25%. Lexiscan Myoview with no evidence for ischemia. Patient still describes NYHA class II-III symptoms although improving. continue torsemide 80 mg twice daily. metolazone 5 mg daily.  Continue Coreg 6.25 mg twice daily, lisinopril 5 mg daily.    Lack of insurance preventing use of Entresto/SGLT2i. Will Consider addition of Aldactone after adequate diuresing.  Check BMP today.  Advised patient to follow-up with primary care provider/OB regarding not having menses. 2. Hyperlipidemia, continue statin  Follow-up in 1 month     Medication Adjustments/Labs and Tests Ordered: Current medicines are reviewed at length with the patient today.  Concerns regarding medicines are outlined above.  Orders Placed This Encounter  Procedures  . Basic metabolic panel  . EKG 12-Lead   No orders of the defined types were placed in this encounter.   Patient Instructions  Medication Instructions:  Your physician recommends that you continue on your current medications as directed. Please refer to the Current Medication list given to you today.  *If you need a refill on your cardiac medications before your next appointment, please call your pharmacy*   Lab Work:  BMP drawn today  If you have labs (blood work) drawn today and your tests are completely normal, you will receive your results only by: . MyChart Message (if you have MyChart) OR . A paper copy in the mail If you have any lab test that is abnormal or we need to change your treatment, we will call you to review the results.   Testing/Procedures: None ordered   Follow-Up: At [facility], you and your health needs are our priority.  As part of our continuing mission to provide you with  exceptional heart care, we have created designated Provider Care Teams.  These Care Teams include your primary Cardiologist (physician) and Advanced Practice Providers (APPs -  Physician Assistants and Nurse Practitioners) who all work together to provide you with the care you need, when you need it.  We recommend signing up for the patient portal called "MyChart".  Sign up information is provided on this After Visit Summary.  MyChart is used to connect with patients for Virtual Visits (Telemedicine).  Patients are able to view lab/test results, encounter notes, upcoming appointments, etc.  Non-urgent messages can be sent to your provider as well.   To learn more about what you can do with MyChart, go to [email or web address].    Your next appointment:   1 month(s)  The format for your next appointment:   In Person  Provider:      Other Instructions       Signed, Brian Agbor-Etang, MD  09/05/2020 4:00 PM    [facility] 

## 2020-09-05 NOTE — Patient Instructions (Signed)
Medication Instructions:  Your physician recommends that you continue on your current medications as directed. Please refer to the Current Medication list given to you today.  *If you need a refill on your cardiac medications before your next appointment, please call your pharmacy*   Lab Work:  BMP drawn today  If you have labs (blood work) drawn today and your tests are completely normal, you will receive your results only by: Marland Kitchen MyChart Message (if you have MyChart) OR . A paper copy in the mail If you have any lab test that is abnormal or we need to change your treatment, we will call you to review the results.   Testing/Procedures: None ordered   Follow-Up: At [facility], you and your health needs are our priority.  As part of our continuing mission to provide you with exceptional heart care, we have created designated Provider Care Teams.  These Care Teams include your primary Cardiologist (physician) and Advanced Practice Providers (APPs -  Physician Assistants and Nurse Practitioners) who all work together to provide you with the care you need, when you need it.  We recommend signing up for the patient portal called "MyChart".  Sign up information is provided on this After Visit Summary.  MyChart is used to connect with patients for Virtual Visits (Telemedicine).  Patients are able to view lab/test results, encounter notes, upcoming appointments, etc.  Non-urgent messages can be sent to your provider as well.   To learn more about what you can do with MyChart, go to [email or web address].    Your next appointment:   1 month(s)  The format for your next appointment:   In Person  Provider:      Other Instructions

## 2020-09-06 LAB — BASIC METABOLIC PANEL
BUN/Creatinine Ratio: 12 (ref 9–23)
BUN: 14 mg/dL (ref 6–20)
CO2: 33 mmol/L — ABNORMAL HIGH (ref 20–29)
Calcium: 7.3 mg/dL — ABNORMAL LOW (ref 8.7–10.2)
Chloride: 92 mmol/L — ABNORMAL LOW (ref 96–106)
Creatinine, Ser: 1.15 mg/dL — ABNORMAL HIGH (ref 0.57–1.00)
Glucose: 241 mg/dL — ABNORMAL HIGH (ref 65–99)
Potassium: 3.3 mmol/L — ABNORMAL LOW (ref 3.5–5.2)
Sodium: 140 mmol/L (ref 134–144)
eGFR: 63 mL/min/{1.73_m2} (ref >59)

## 2020-09-08 ENCOUNTER — Telehealth: Payer: Self-pay | Admitting: *Deleted

## 2020-09-08 DIAGNOSIS — I5020 Unspecified systolic (congestive) heart failure: Secondary | ICD-10-CM

## 2020-09-08 NOTE — Telephone Encounter (Signed)
-----   Message from Debbe Odea, MD sent at 09/08/2020 12:38 PM EDT ----- Creatinine elevated, reduce torsemide to 40 mg twice daily.  Recheck BMP in 2 weeks.  Thank you

## 2020-09-08 NOTE — Telephone Encounter (Signed)
No answer/Call cannot be completed at this time.  °

## 2020-09-10 NOTE — Telephone Encounter (Signed)
No answer, and unable to leave VM.

## 2020-09-11 MED ORDER — TORSEMIDE 40 MG PO TABS: 40.0000 mg | ORAL_TABLET | Freq: Two times a day (BID) | ORAL | Status: DC

## 2020-09-11 NOTE — Telephone Encounter (Signed)
Spoke with patient and gave her the result note. Patient agreed to reduce her Torsemide to 40 mg twice daily and [facility].

## 2020-09-26 ENCOUNTER — Ambulatory Visit: Payer: Self-pay | Admitting: Pharmacy Technician

## 2020-09-26 ENCOUNTER — Other Ambulatory Visit: Payer: Self-pay

## 2020-09-26 DIAGNOSIS — Z79899 Other long term (current) drug therapy: Secondary | ICD-10-CM

## 2020-09-26 NOTE — Progress Notes (Signed)
Patient still needs to provide 2022 Disability Award Letter from [employer] and 2021 [employer] Tax Return.  No additional medication assistance will be provided until patient provides [facility] with this requested financial documentation.  Sherilyn Dacosta Care Manager [facility]

## 2020-10-02 ENCOUNTER — Telehealth: Payer: Self-pay

## 2020-10-02 NOTE — Telephone Encounter (Signed)
Called patient and spoke with her mom, as patient was not at home. She stated that the patient has become bloated and swollen again. I informed her that we had to reduce her Torsemide dose due to her kidney function and that is why it so important she get these labs when we recommend them. We don't want her off the higher dose very long, so when we look at her labs, if her kidney function has improved, we will increase her back to the 40MG  twice a day.  Patients mother stated she would call her daughter right now and inform her of our conversation, and make sure she gets her lab drawn asap.

## 2020-10-02 NOTE — Telephone Encounter (Signed)
-----   Message from Gibson Ramp, RN sent at 09/11/2020  4:23 PM EDT ----- Ck for 2 week lab draw from 09/11/20

## 2020-10-06 ENCOUNTER — Other Ambulatory Visit: Payer: Self-pay

## 2020-10-10 ENCOUNTER — Inpatient Hospital Stay
Admission: EM | Admit: 2020-10-10 | Discharge: 2020-10-18 | DRG: 291 | Disposition: A | Discharge status: Home / Self Care | Payer: Medicaid Other | Attending: Internal Medicine | Admitting: Internal Medicine

## 2020-10-10 ENCOUNTER — Emergency Department: Payer: Medicaid Other

## 2020-10-10 ENCOUNTER — Other Ambulatory Visit: Payer: Self-pay

## 2020-10-10 DIAGNOSIS — I3139 Other pericardial effusion (noninflammatory): Secondary | ICD-10-CM

## 2020-10-10 DIAGNOSIS — I5043 Acute on chronic combined systolic (congestive) and diastolic (congestive) heart failure: Secondary | ICD-10-CM | POA: Diagnosis present

## 2020-10-10 DIAGNOSIS — Z833 Family history of diabetes mellitus: Secondary | ICD-10-CM

## 2020-10-10 DIAGNOSIS — Z20822 Contact with and (suspected) exposure to covid-19: Secondary | ICD-10-CM | POA: Diagnosis present

## 2020-10-10 DIAGNOSIS — I420 Dilated cardiomyopathy: Secondary | ICD-10-CM

## 2020-10-10 DIAGNOSIS — D75838 Other thrombocytosis: Secondary | ICD-10-CM

## 2020-10-10 DIAGNOSIS — R0989 Other specified symptoms and signs involving the circulatory and respiratory systems: Secondary | ICD-10-CM

## 2020-10-10 DIAGNOSIS — I110 Hypertensive heart disease with heart failure: Principal | ICD-10-CM | POA: Diagnosis present

## 2020-10-10 DIAGNOSIS — K219 Gastro-esophageal reflux disease without esophagitis: Secondary | ICD-10-CM | POA: Diagnosis present

## 2020-10-10 DIAGNOSIS — I10 Essential (primary) hypertension: Secondary | ICD-10-CM | POA: Diagnosis present

## 2020-10-10 DIAGNOSIS — Z87891 Personal history of nicotine dependence: Secondary | ICD-10-CM

## 2020-10-10 DIAGNOSIS — Z79899 Other long term (current) drug therapy: Secondary | ICD-10-CM

## 2020-10-10 DIAGNOSIS — I313 Pericardial effusion (noninflammatory): Secondary | ICD-10-CM | POA: Diagnosis present

## 2020-10-10 DIAGNOSIS — R601 Generalized edema: Secondary | ICD-10-CM

## 2020-10-10 DIAGNOSIS — Z794 Long term (current) use of insulin: Secondary | ICD-10-CM

## 2020-10-10 DIAGNOSIS — D649 Anemia, unspecified: Secondary | ICD-10-CM

## 2020-10-10 DIAGNOSIS — Z9114 Patient's other noncompliance with medication regimen: Secondary | ICD-10-CM

## 2020-10-10 DIAGNOSIS — E785 Hyperlipidemia, unspecified: Secondary | ICD-10-CM | POA: Diagnosis present

## 2020-10-10 DIAGNOSIS — Z8249 Family history of ischemic heart disease and other diseases of the circulatory system: Secondary | ICD-10-CM

## 2020-10-10 DIAGNOSIS — E876 Hypokalemia: Secondary | ICD-10-CM | POA: Diagnosis present

## 2020-10-10 DIAGNOSIS — Z6841 Body Mass Index (BMI) 40.0 and over, adult: Secondary | ICD-10-CM

## 2020-10-10 DIAGNOSIS — I509 Heart failure, unspecified: Secondary | ICD-10-CM

## 2020-10-10 DIAGNOSIS — E1165 Type 2 diabetes mellitus with hyperglycemia: Secondary | ICD-10-CM

## 2020-10-10 DIAGNOSIS — I428 Other cardiomyopathies: Secondary | ICD-10-CM

## 2020-10-10 DIAGNOSIS — F1410 Cocaine abuse, uncomplicated: Secondary | ICD-10-CM | POA: Diagnosis present

## 2020-10-10 DIAGNOSIS — Z7982 Long term (current) use of aspirin: Secondary | ICD-10-CM

## 2020-10-10 HISTORY — DX: Heart failure, unspecified: I50.9

## 2020-10-10 LAB — BASIC METABOLIC PANEL
Anion gap: 10 (ref 5–15)
BUN: 20 mg/dL (ref 6–20)
CO2: 32 mmol/L (ref 22–32)
Calcium: 7.3 mg/dL — ABNORMAL LOW (ref 8.9–10.3)
Chloride: 92 mmol/L — ABNORMAL LOW (ref 98–111)
Creatinine, Ser: 1.05 mg/dL — ABNORMAL HIGH (ref 0.44–1.00)
GFR, Estimated: >60 mL/min (ref >60)
Glucose, Bld: 367 mg/dL — ABNORMAL HIGH (ref 70–99)
Potassium: 4.2 mmol/L (ref 3.5–5.1)
Sodium: 134 mmol/L — ABNORMAL LOW (ref 135–145)

## 2020-10-10 LAB — BRAIN NATRIURETIC PEPTIDE: B Natriuretic Peptide: 841.3 pg/mL — ABNORMAL HIGH (ref 0.0–100.0)

## 2020-10-10 LAB — CBC
HCT: 31.9 % — ABNORMAL LOW (ref 36.0–46.0)
Hemoglobin: 9.6 g/dL — ABNORMAL LOW (ref 12.0–15.0)
MCH: 25.5 pg — ABNORMAL LOW (ref 26.0–34.0)
MCHC: 30.1 g/dL (ref 30.0–36.0)
MCV: 84.6 fL (ref 80.0–100.0)
Platelets: 407 10*3/uL — ABNORMAL HIGH (ref 150–400)
RBC: 3.77 MIL/uL — ABNORMAL LOW (ref 3.87–5.11)
RDW: 17.1 % — ABNORMAL HIGH (ref 11.5–15.5)
WBC: 5.1 10*3/uL (ref 4.0–10.5)
nRBC: 0 % (ref 0.0–0.2)

## 2020-10-10 LAB — TROPONIN I (HIGH SENSITIVITY)
Troponin I (High Sensitivity): 14 ng/L (ref <18)
Troponin I (High Sensitivity): 15 ng/L (ref <18)

## 2020-10-10 MED ORDER — FUROSEMIDE 10 MG/ML IJ SOLN
100.0000 mg | Freq: Once | INTRAVENOUS | Status: AC
  Administered 2020-10-11: 100 mg via INTRAVENOUS
  Filled 2020-10-10: qty 10

## 2020-10-10 NOTE — ED Provider Notes (Signed)
[facility] Emergency Department Provider Note  ____________________________________________   Event Date/Time   First MD Initiated Contact with Patient 10/10/20 2259     (approximate)  I have reviewed the triage vital signs and the nursing notes.   HISTORY  Chief Complaint Edema    HPI Molly Howard is a 35 y.o. female with known nonischemic cardiomyopathy with an ejection fraction of 20 to 25% and who has had issues following up in clinic in the past but has previously seen [facility] [facility] cardiologist.  She presents for gradually worsening generalized swelling shortness of breath particular with exertion and with lying flat.  The symptoms have been gradually worsening for the last month since her outpatient torsemide dosing was decreased in half to torsemide 40 mg twice daily.  She was previously on 80 mg twice daily.  According to the notes and according to the patient, her dose was reduced because the cardiology group was concerned about a slight elevation in her creatinine.  It is unclear why she has not had follow-up before now in clinic although she reports that she has an appointment in about a week.  However symptoms have been worsening more rapidly over the last couple of days and she feels increasingly short of breath, gets winded very quickly with any amount of exertion, and has a frequent cough and feels like she has trouble breathing.  She has no chest pain.  She denies fever/chills, sore throat, nausea, vomiting, abdominal pain, dysuria.  She said that her weight is up possibly as much as 40 pounds over what she thinks is her baseline.  She has not been to the [facility] heart failure clinic previously.     Past Medical History:  Diagnosis Date   Acid reflux    CHF (congestive heart failure) ([place])    Diabetes mellitus ([place])    Obesity     Patient Active Problem List   Diagnosis Date Noted   Dyspnea 05/21/2020   Elevated troponin  05/20/2020   Essential hypertension 05/20/2020   Uncontrolled diabetes mellitus ([place]) 05/20/2020   Anemia 05/20/2020   Thrombocytosis 05/20/2020   Obesity (BMI 30-39.9) 05/20/2020   Acute on chronic combined systolic and diastolic heart failure ([place]) 05/20/2020   CHF (congestive heart failure) ([place]) 05/19/2020   Chronic combined systolic and diastolic heart failure ([place]) 11/01/2019   Financial difficulties 11/01/2019   Medication management 11/01/2019   Lung nodule 11/01/2019   NICM (nonischemic cardiomyopathy) ([place]) 11/01/2019   Polysubstance abuse ([place]) 11/01/2019   Tobacco use 11/01/2019   HFrEF (heart failure with reduced ejection fraction) ([facility])    Swelling    Symptomatic anemia 10/02/2019    Past Surgical History:  Procedure Laterality Date   CHOLECYSTECTOMY     HERNIA REPAIR      Prior to Admission medications   Medication Sig Start Date End Date Taking? Authorizing Provider  aspirin EC 81 MG EC tablet Take 1 tablet (81 mg total) by mouth daily. 10/06/19   Raiford Noble Latif, DO  atorvastatin (LIPITOR) 20 MG tablet TAKE ONE TABLET BY MOUTH EVERY DAY 05/22/20 05/22/21  Enzo Bi, MD  BD VEO INSULIN SYRINGE U/F 31G X 15/64" 0.3 ML MISC USE AS DIRECTED WITH INSULIN Patient taking differently: See admin instructions. with insulin 05/22/20 05/22/21    blood glucose meter kit and supplies KIT Dispense based on patient and insurance preference. Use up to four times daily as directed. (FOR ICD-9 250.00, 250.01). 10/05/19   Raiford Noble  Latif, DO  calcium carbonate (OS-CAL) 600 MG TABS tablet Take 1 tablet (600 mg total) by mouth 2 (two) times daily with a meal. 08/06/20   [name], MD  carvedilol (COREG) 6.25 MG tablet TAKE ONE TABLET BY MOUTH 2 TIMES A DAY WITH MEALS 05/22/20 05/22/21  Enzo Bi, MD  insulin aspart protamine- aspart (NOVOLOG MIX 70/30) (70-30) 100 UNIT/ML injection INJECT 10 UNITS INTO THE SKIN 2 TIMES A DAY WITH MEALS 05/22/20 05/22/21  Enzo Bi, MD  insulin  isophane & regular human (NOVOLIN 70/30 FLEXPEN RELION) (70-30) 100 UNIT/ML KwikPen Inject 10 Units into the skin 2 (two) times daily with a meal. 05/22/20 08/20/20  Enzo Bi, MD  Insulin Pen Needle (RELION MINI PEN NEEDLES) 31G X 6 MM MISC 1 Container by Does not apply route in the morning and at bedtime. 10/05/19   Raiford Noble Latif, DO  iron polysaccharides (NIFEREX) 150 MG capsule TAKE ONE CAPSULE BY MOUTH EVERY DAY 05/22/20 05/22/21  Enzo Bi, MD  lisinopril (ZESTRIL) 5 MG tablet TAKE ONE TABLET BY MOUTH EVERY DAY 05/22/20 05/22/21  Enzo Bi, MD  metolazone (ZAROXOLYN) 5 MG tablet TAKE ONE TABLET BY MOUTH EVERY DAY 1 HR PRIOR TO TAKING TORSEMIDE 07/18/20 07/18/21  [name], MD  Potassium Chloride 40 MEQ/15ML (20%) SOLN Take 40 mEq by mouth 2 (two) times daily. 07/04/20   [name], MD  potassium chloride SA (KLOR-CON) 20 MEQ tablet TAKE 2 TABLETS (40 MEQ TOTAL) BY MOUTH TWICE DAILY. 07/04/20 07/04/21  [name], MD  torsemide (DEMADEX) 20 MG tablet TAKE 4 TABLETS (80 MG) BY MOUTH 2 TIMES A DAY. STOP TAKING LASIX/FUROSEMIDE. 07/18/20 07/18/21  [name], MD  Torsemide 40 MG TABS Take 40 mg by mouth 2 (two) times daily. 09/11/20 12/10/20  [name], MD  zolpidem (AMBIEN) 5 MG tablet Take 1 tablet (5 mg total) by mouth at bedtime as needed for up to 14 days for sleep. 07/22/20 08/05/20  [name], MD    Allergies No healthtouch food allergies  Family History  Problem Relation Age of Onset   Heart failure Mother        a. onset in her 83s   Diabetes Mother    Hypertension Father    Diabetes Father     Social History Social History   Tobacco Use   Smoking status: Former    Packs/day: 0.50    Pack years: 0.00    Types: Cigarettes   Smokeless tobacco: Never  Vaping Use   Vaping Use: Never used  Substance Use Topics   Alcohol use: Not Currently    Comment: ~ 4 shots/day on weekends only   Drug use: Not Currently    Types: Cocaine    Comment:  Last day of use was prior to June 2021 hosp.visit.     Review of Systems Constitutional: No fever/chills Eyes: No visual changes. ENT: No sore throat. Cardiovascular: Denies chest pain.  Generalized swelling and weight gain. Respiratory: Shortness of breath particularly with exertion and with lying flat. Gastrointestinal: No abdominal pain.  No nausea, no vomiting.  No diarrhea.  No constipation. Genitourinary: Negative for dysuria. Musculoskeletal: Generalized swelling.  Negative for neck pain.  Negative for back pain. Integumentary : Negative for rash. Neurological: Negative for headaches, focal weakness or numbness.   ____________________________________________   PHYSICAL EXAM:  VITAL SIGNS: ED Triage Vitals  Enc Vitals Group     BP 10/10/20 1311 97/80     Pulse Rate 10/10/20 1311 (!) 109  Resp 10/10/20 1311 20     Temp 10/10/20 1311 98.5 F (36.9 C)     Temp Source 10/10/20 1311 Oral     SpO2 10/10/20 1311 95 %     Weight 10/10/20 1253 107.5 kg (237 lb)     Height 10/10/20 1253 1.702 m (_0 )     Head Circumference --      Peak Flow --      Pain Score 10/10/20 1253 8     Pain Loc --      Pain Edu? --      Excl. in [place]? --     Constitutional: Alert and oriented.  Anasarca. Eyes: Conjunctivae are normal.  Head: Atraumatic. Nose: No congestion/rhinnorhea. Mouth/Throat: Patient is wearing a mask. Neck: No stridor.  No meningeal signs.   Cardiovascular: Normal rate, regular rhythm. Good peripheral circulation. Respiratory: Normal respiratory effort.  No retractions. Gastrointestinal: Tense abdomen due to anasarca but with no tenderness to palpation.  Morbid obesity. Musculoskeletal: Tense peripheral edema throughout all of her extremities with chronic skin thickening. Neurologic:  Normal speech and language. No gross focal neurologic deficits are appreciated.  Skin:  Skin is warm, dry and intact.  Chronic skin thickening likely due to persistent peripheral edema  and vascular insufficiency. Psychiatric: Mood and affect are normal. Speech and behavior are normal.  ____________________________________________   LABS (all labs ordered are listed, but only abnormal results are displayed)  Labs Reviewed  BASIC METABOLIC PANEL - Abnormal; Notable for the following components:      Result Value   Sodium 134 (*)    Chloride 92 (*)    Glucose, Bld 367 (*)    Creatinine, Ser 1.05 (*)    Calcium 7.3 (*)    All other components within normal limits  CBC - Abnormal; Notable for the following components:   RBC 3.77 (*)    Hemoglobin 9.6 (*)    HCT 31.9 (*)    MCH 25.5 (*)    RDW 17.1 (*)    Platelets 407 (*)    All other components within normal limits  BRAIN NATRIURETIC PEPTIDE - Abnormal; Notable for the following components:   B Natriuretic Peptide 841.3 (*)    All other components within normal limits  RESP PANEL BY RT-PCR (FLU A&B, COVID) [id number]  POC URINE PREG, ED  TROPONIN I (HIGH SENSITIVITY)  TROPONIN I (HIGH SENSITIVITY)   ____________________________________________  EKG  ED ECG REPORT I, Hinda Kehr, the attending physician, personally viewed and interpreted this ECG.  Date: 10/10/2020 EKG Time: 13: 15 Rate: 108 Rhythm: Sinus tachycardia QRS Axis: normal Intervals: Left posterior fascicular block ST/T Wave abnormalities: Non-specific ST segment / T-wave changes, but no clear evidence of acute ischemia. Narrative Interpretation: no definitive evidence of acute ischemia; does not meet STEMI criteria.  ____________________________________________  RADIOLOGY I, Hinda Kehr, personally viewed and evaluated these images (plain radiographs) as part of my medical decision making, as well as reviewing the written report by the radiologist.  ED MD interpretation: Pulmonary vascular congestion, interstitial edema, questionable pericardial effusion.  Official radiology report(s): DG Chest 2 View  Result Date: 10/10/2020 CLINICAL  DATA:  Shortness of breath.  Fluid retention. EXAM: CHEST - 2 VIEW COMPARISON:  Chest x-ray dated May 19, 2020. FINDINGS: Cardiopericardial silhouette appears enlarged compared to the prior study. Continued pulmonary vascular congestion and interstitial edema. Right lower lobe atelectasis. No focal consolidation, pleural effusion, or pneumothorax. No acute osseous abnormality. IMPRESSION: 1. Enlarging cardiopericardial silhouette concerning for underlying pericardial  effusion. 2. Continued pulmonary vascular congestion and interstitial edema. Electronically Signed   By: Titus Dubin M.D.   On: 10/10/2020 14:26    ____________________________________________   PROCEDURES   Procedure(s) performed (including Critical Care):  .1-3 Lead EKG Interpretation  Date/Time: 10/11/2020 12:01 AM Performed by: Hinda Kehr, MD Authorized by: Hinda Kehr, MD     Interpretation: abnormal     ECG rate:  108   ECG rate assessment: tachycardic     Rhythm: sinus tachycardia     Ectopy: none     Conduction: normal     ____________________________________________   INITIAL IMPRESSION / MDM / ASSESSMENT AND PLAN / ED COURSE  As part of my medical decision making, I reviewed the following data within the [place] notes reviewed and incorporated, Labs reviewed , EKG interpreted , Old chart reviewed, Discussed with admitting physician , and Notes from prior ED visits   Differential diagnosis includes, but is not limited to, CHF exacerbation, worsening cardiomyopathy, pericardial effusion, acute infection, ACS.  The patient is on the cardiac monitor to evaluate for evidence of arrhythmia and/or significant heart rate changes.  I reviewed the medical record extensively and it looks like she has some problems with outpatient follow-up although she has been in contact recently with a provider at [facility] as well as with [facility] health medical group as her symptoms have become more  acutely worse.  Her vital signs are generally stable with an appropriate systolic blood pressure but an elevated diastolic and some mild tachycardia.  I suspect this is exertional as the patient is in mild respiratory distress/discomfort when sitting in bed and becomes significantly worse if placed in more of a prone position.  Her physical exam is notable for tense anasarca throughout her body and she is reporting possibly as much is 40 pounds over her anticipated weight.  I suspect this is largely the results of her torsemide dose, which was already substantial given her EF of 20 to 25%, being cut in half due to concerns about her kidney function.  No evidence of acute infection.  Nonischemic EKG.  I personally reviewed the patient's imaging and agree with the radiologist's interpretation that there is evidence of interstitial edema and an enlarged cardiac silhouette which is suggestive of pericardial effusion.  However there is no evidence of tamponade at this time and no indication for emergent pericardiocentesis but she will very likely benefit from an echocardiogram for further assessment of both her cardiac function and confirming the presence and size of the effusion.  Basic metabolic panel is essentially normal including her kidney function.  CBC is also essentially normal.  BNP is elevated at 841.  First high-sensitivity troponin was slightly elevated above "normal" but is not indicative of ACS and the patient is having no chest pain.  I talked with her about inpatient versus outpatient options, including getting her referred to the [facility] heart failure clinic with an appointment within the next 3 to 4 days, but given that her symptoms been acutely worsening over the last couple of days and is now the weekend she does not believe that she can wait.  She meets criteria for inpatient treatment based on her anasarca and substantial weight gain that seems to be largely fluid in addition to her interstitial  edema and the question of a pericardial effusion.  Given the large dose of torsemide that she was on previously and has been on recently (she was taking torsemide 80 mg p.o. twice  daily), I will give her a large dose of furosemide 100 mg IV now and defer to the hospitalist for additional management.     Clinical Course as of 10/11/20 0010  Ludwig Clarks Oct 10, 2020  2342 Stable HS troponin [CF]  2353 Consulting hospitalist for admission [CF]  Sat Oct 11, 2020  0010 Discussed case with Dr. Damita Dunnings with the hospitalist service who will admit. [CF]    Clinical Course User Index [CF] Hinda Kehr, MD     ____________________________________________  FINAL CLINICAL IMPRESSION(S) / ED DIAGNOSES  Final diagnoses:  Acute on chronic congestive heart failure, unspecified heart failure type [facility])  Pulmonary vascular congestion  Pericardial effusion  Anasarca     MEDICATIONS GIVEN DURING THIS VISIT:  Medications  furosemide (LASIX) 100 mg in dextrose 5 % 50 mL IVPB (has no administration in time range)     ED Discharge Orders     None        Note:  This document was prepared using Dragon voice recognition software and may include unintentional dictation errors.   Hinda Kehr, MD 10/11/20 0010

## 2020-10-10 NOTE — ED Notes (Addendum)
Pt asking for something to eat and stated, "I am diabetic and I feel like my sugar is dropping." April, RN is aware that pt is asking for food and is fine with pt having graham crackers and water at this time. Pt given a cup of water and two packs of graham crackers.

## 2020-10-10 NOTE — ED Triage Notes (Signed)
Pt comes into the ED via EMS from home with c/o fluid retention with generalized edema, states she has been taking prescribed fluid pill with no relief Normally on insulin , not taken today [id number] 108 98.8 97%RA

## 2020-10-10 NOTE — ED Notes (Signed)
Pt w/ dyspnea at rest, c/o SOB. Pt with noted 2-3+ pitting edema in arms and legs bilaterally.

## 2020-10-11 ENCOUNTER — Inpatient Hospital Stay (HOSPITAL_COMMUNITY)
Admit: 2020-10-11 | Discharge: 2020-10-11 | Disposition: A | Discharge status: Home / Self Care | Payer: Medicaid Other | Attending: Internal Medicine | Admitting: Internal Medicine

## 2020-10-11 DIAGNOSIS — I313 Pericardial effusion (noninflammatory): Secondary | ICD-10-CM | POA: Diagnosis present

## 2020-10-11 DIAGNOSIS — Z9114 Patient's other noncompliance with medication regimen: Secondary | ICD-10-CM | POA: Diagnosis not present

## 2020-10-11 DIAGNOSIS — I428 Other cardiomyopathies: Secondary | ICD-10-CM | POA: Diagnosis present

## 2020-10-11 DIAGNOSIS — Z7982 Long term (current) use of aspirin: Secondary | ICD-10-CM | POA: Diagnosis not present

## 2020-10-11 DIAGNOSIS — E785 Hyperlipidemia, unspecified: Secondary | ICD-10-CM | POA: Diagnosis present

## 2020-10-11 DIAGNOSIS — E1165 Type 2 diabetes mellitus with hyperglycemia: Secondary | ICD-10-CM | POA: Diagnosis present

## 2020-10-11 DIAGNOSIS — Z20822 Contact with and (suspected) exposure to covid-19: Secondary | ICD-10-CM | POA: Diagnosis present

## 2020-10-11 DIAGNOSIS — Z87891 Personal history of nicotine dependence: Secondary | ICD-10-CM | POA: Diagnosis not present

## 2020-10-11 DIAGNOSIS — E876 Hypokalemia: Secondary | ICD-10-CM | POA: Diagnosis present

## 2020-10-11 DIAGNOSIS — R601 Generalized edema: Secondary | ICD-10-CM

## 2020-10-11 DIAGNOSIS — I10 Essential (primary) hypertension: Secondary | ICD-10-CM

## 2020-10-11 DIAGNOSIS — F1410 Cocaine abuse, uncomplicated: Secondary | ICD-10-CM | POA: Diagnosis present

## 2020-10-11 DIAGNOSIS — I5043 Acute on chronic combined systolic (congestive) and diastolic (congestive) heart failure: Secondary | ICD-10-CM | POA: Diagnosis present

## 2020-10-11 DIAGNOSIS — I110 Hypertensive heart disease with heart failure: Secondary | ICD-10-CM | POA: Diagnosis not present

## 2020-10-11 DIAGNOSIS — Z794 Long term (current) use of insulin: Secondary | ICD-10-CM | POA: Diagnosis not present

## 2020-10-11 DIAGNOSIS — Z833 Family history of diabetes mellitus: Secondary | ICD-10-CM | POA: Diagnosis not present

## 2020-10-11 DIAGNOSIS — D75838 Other thrombocytosis: Secondary | ICD-10-CM | POA: Diagnosis present

## 2020-10-11 DIAGNOSIS — Z6841 Body Mass Index (BMI) 40.0 and over, adult: Secondary | ICD-10-CM | POA: Diagnosis not present

## 2020-10-11 DIAGNOSIS — Z8249 Family history of ischemic heart disease and other diseases of the circulatory system: Secondary | ICD-10-CM | POA: Diagnosis not present

## 2020-10-11 DIAGNOSIS — K219 Gastro-esophageal reflux disease without esophagitis: Secondary | ICD-10-CM | POA: Diagnosis present

## 2020-10-11 DIAGNOSIS — Z79899 Other long term (current) drug therapy: Secondary | ICD-10-CM | POA: Diagnosis not present

## 2020-10-11 LAB — ECHOCARDIOGRAM COMPLETE
AR max vel: 1.44 cm2
AV Peak grad: 11.8 mmHg
Ao pk vel: 1.72 m/s
Area-P 1/2: 5.38 cm2
Calc EF: 30.7 %
Height: 67 in
S' Lateral: 4.54 cm
Single Plane A2C EF: 18.9 %
Single Plane A4C EF: 41.1 %
Weight: 3680 oz

## 2020-10-11 LAB — URINE DRUG SCREEN, QUALITATIVE (ARMC ONLY)
Amphetamines, Ur Screen: NOT DETECTED
Barbiturates, Ur Screen: NOT DETECTED
Benzodiazepine, Ur Scrn: NOT DETECTED
Cannabinoid 50 Ng, Ur ~~LOC~~: NOT DETECTED
Cocaine Metabolite,Ur ~~LOC~~: POSITIVE — AB
MDMA (Ecstasy)Ur Screen: NOT DETECTED
Methadone Scn, Ur: NOT DETECTED
Opiate, Ur Screen: NOT DETECTED
Phencyclidine (PCP) Ur S: NOT DETECTED
Tricyclic, Ur Screen: NOT DETECTED

## 2020-10-11 LAB — BASIC METABOLIC PANEL
Anion gap: 9 (ref 5–15)
BUN: 19 mg/dL (ref 6–20)
CO2: 32 mmol/L (ref 22–32)
Calcium: 7.4 mg/dL — ABNORMAL LOW (ref 8.9–10.3)
Chloride: 92 mmol/L — ABNORMAL LOW (ref 98–111)
Creatinine, Ser: 0.99 mg/dL (ref 0.44–1.00)
GFR, Estimated: >60 mL/min (ref >60)
Glucose, Bld: 332 mg/dL — ABNORMAL HIGH (ref 70–99)
Potassium: 3.6 mmol/L (ref 3.5–5.1)
Sodium: 133 mmol/L — ABNORMAL LOW (ref 135–145)

## 2020-10-11 LAB — RESP PANEL BY RT-PCR (FLU A&B, COVID) ARPGX2
Influenza A by PCR: NEGATIVE
Influenza B by PCR: NEGATIVE
SARS Coronavirus 2 by RT PCR: NEGATIVE

## 2020-10-11 LAB — C-REACTIVE PROTEIN: CRP: 3 mg/dL — ABNORMAL HIGH (ref <1.0)

## 2020-10-11 LAB — CBG MONITORING, ED
Glucose-Capillary: 355 mg/dL — ABNORMAL HIGH (ref 70–99)
Glucose-Capillary: 248 mg/dL — ABNORMAL HIGH (ref 70–99)
Glucose-Capillary: 159 mg/dL — ABNORMAL HIGH (ref 70–99)
Glucose-Capillary: 177 mg/dL — ABNORMAL HIGH (ref 70–99)

## 2020-10-11 LAB — SEDIMENTATION RATE: Sed Rate: 78 mm/hr — ABNORMAL HIGH (ref 0–20)

## 2020-10-11 LAB — GLUCOSE, CAPILLARY: Glucose-Capillary: 168 mg/dL — ABNORMAL HIGH (ref 70–99)

## 2020-10-11 LAB — HIV ANTIBODY (ROUTINE TESTING W REFLEX): HIV Screen 4th Generation wRfx: NONREACTIVE

## 2020-10-11 MED ORDER — SODIUM CHLORIDE 0.9 % IV SOLN: 250.0000 mL | INTRAVENOUS | Status: DC | PRN

## 2020-10-11 MED ORDER — ENOXAPARIN SODIUM 60 MG/0.6ML IJ SOSY
0.5000 mg/kg | PREFILLED_SYRINGE | INTRAMUSCULAR | Status: DC
  Administered 2020-10-11 – 2020-10-12 (×2): 52.5 mg via SUBCUTANEOUS
  Filled 2020-10-11: qty 0.53
  Filled 2020-10-11: qty 0.6

## 2020-10-11 MED ORDER — SODIUM CHLORIDE 0.9% FLUSH: 3.0000 mL | INTRAVENOUS | Status: DC | PRN

## 2020-10-11 MED ORDER — INSULIN ASPART 100 UNIT/ML IJ SOLN
0.0000 [IU] | Freq: Three times a day (TID) | INTRAMUSCULAR | Status: DC
  Administered 2020-10-11: 7 [IU] via SUBCUTANEOUS
  Administered 2020-10-11 (×2): 4 [IU] via SUBCUTANEOUS
  Administered 2020-10-12: 7 [IU] via SUBCUTANEOUS
  Administered 2020-10-12 – 2020-10-13 (×2): 4 [IU] via SUBCUTANEOUS
  Administered 2020-10-13: 3 [IU] via SUBCUTANEOUS
  Administered 2020-10-13: 4 [IU] via SUBCUTANEOUS
  Administered 2020-10-14: 7 [IU] via SUBCUTANEOUS
  Administered 2020-10-14 – 2020-10-15 (×4): 4 [IU] via SUBCUTANEOUS
  Administered 2020-10-15: 3 [IU] via SUBCUTANEOUS
  Administered 2020-10-16: 4 [IU] via SUBCUTANEOUS
  Administered 2020-10-16: 3 [IU] via SUBCUTANEOUS
  Administered 2020-10-17 (×2): 7 [IU] via SUBCUTANEOUS
  Administered 2020-10-17 – 2020-10-18 (×2): 4 [IU] via SUBCUTANEOUS
  Filled 2020-10-11 (×20): qty 1

## 2020-10-11 MED ORDER — CARVEDILOL 6.25 MG PO TABS
6.2500 mg | ORAL_TABLET | Freq: Two times a day (BID) | ORAL | Status: DC
  Administered 2020-10-11 – 2020-10-18 (×15): 6.25 mg via ORAL
  Filled 2020-10-11 (×15): qty 1

## 2020-10-11 MED ORDER — MAGNESIUM SULFATE 2 GM/50ML IV SOLN
2.0000 g | Freq: Once | INTRAVENOUS | Status: AC
  Administered 2020-10-11: 2 g via INTRAVENOUS
  Filled 2020-10-11: qty 50

## 2020-10-11 MED ORDER — ONDANSETRON HCL 4 MG/2ML IJ SOLN: 4.0000 mg | Freq: Four times a day (QID) | INTRAMUSCULAR | Status: DC | PRN

## 2020-10-11 MED ORDER — FUROSEMIDE 10 MG/ML IJ SOLN
80.0000 mg | Freq: Two times a day (BID) | INTRAMUSCULAR | Status: DC
  Administered 2020-10-11 – 2020-10-14 (×6): 80 mg via INTRAVENOUS
  Filled 2020-10-11 (×6): qty 8

## 2020-10-11 MED ORDER — METOLAZONE 5 MG PO TABS
5.0000 mg | ORAL_TABLET | Freq: Every day | ORAL | Status: DC
  Administered 2020-10-11 – 2020-10-18 (×8): 5 mg via ORAL
  Filled 2020-10-11 (×8): qty 1

## 2020-10-11 MED ORDER — SODIUM CHLORIDE 0.9% FLUSH
3.0000 mL | Freq: Two times a day (BID) | INTRAVENOUS | Status: DC
  Administered 2020-10-11 – 2020-10-18 (×14): 3 mL via INTRAVENOUS

## 2020-10-11 MED ORDER — INSULIN ASPART 100 UNIT/ML IJ SOLN
0.0000 [IU] | Freq: Every day | INTRAMUSCULAR | Status: DC
  Administered 2020-10-11: 5 [IU] via SUBCUTANEOUS
  Administered 2020-10-12 – 2020-10-17 (×4): 2 [IU] via SUBCUTANEOUS
  Filled 2020-10-11 (×5): qty 1

## 2020-10-11 MED ORDER — LISINOPRIL 5 MG PO TABS
5.0000 mg | ORAL_TABLET | Freq: Every day | ORAL | Status: DC
  Administered 2020-10-11 – 2020-10-13 (×3): 5 mg via ORAL
  Filled 2020-10-11 (×3): qty 1

## 2020-10-11 MED ORDER — ACETAMINOPHEN 325 MG PO TABS
650.0000 mg | ORAL_TABLET | ORAL | Status: DC | PRN
  Administered 2020-10-16 – 2020-10-17 (×2): 650 mg via ORAL
  Filled 2020-10-11 (×2): qty 2

## 2020-10-11 MED ORDER — FUROSEMIDE 10 MG/ML IJ SOLN: 100.0000 mg | Freq: Two times a day (BID) | INTRAVENOUS | Status: DC

## 2020-10-11 MED ORDER — FUROSEMIDE 10 MG/ML IJ SOLN
100.0000 mg | Freq: Two times a day (BID) | INTRAVENOUS | Status: DC
  Administered 2020-10-11: 100 mg via INTRAVENOUS
  Filled 2020-10-11 (×2): qty 10

## 2020-10-11 NOTE — ED Notes (Signed)
Report received from [facility]. Pt transitioned to hospital bed and moved to RM35. Bedside commode set up. Pt given sandwich tray and drink. Call light within reach. Pt denies further needs at this time.

## 2020-10-11 NOTE — Progress Notes (Signed)
Anticoagulation monitoring(Lovenox):  35 yo female ordered Lovenox 40 mg Q24h    Filed Weights   10/10/20 1253 10/10/20 1311  Weight: 107.5 kg (237 lb) 104.3 kg (230 lb)   BMI 36   Lab Results  Component Value Date   CREATININE 1.05 (H) 10/10/2020   CREATININE 1.15 (H) 09/05/2020   CREATININE 0.83 08/04/2020   Estimated Creatinine Clearance: 92 mL/min (A) (by C-G formula based on SCr of 1.05 mg/dL (H)). Hemoglobin & Hematocrit     Component Value Date/Time   HGB 9.6 (L) 10/10/2020 1312   HCT 31.9 (L) 10/10/2020 1312     Per Protocol for Patient with estCrcl > 30 ml/min and BMI > 30, will transition to Lovenox 52.5 mg Q24h.

## 2020-10-11 NOTE — H&P (Signed)
History and Physical    Molly Howard IWP:[id number] DOB: January 23, 1986 DOA: 10/10/2020  PCP: Patient, No Pcp Per (Inactive)   Patient coming from: Home  I have personally briefly reviewed patient's old medical records in [facility]  Chief Complaint: Generalized swelling  HPI: Molly Howard is a 35 y.o. 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  She states that a month ago her doctor cut her torsemide dose in half due to concern for her kidney function and since then she started retaining fluid.  She states that she could barely make it from room to room and has to use a cane of late.  She has a cough, but denies fever or chills.  She denies chest pain.  Denies palpitations.  Has had no nausea or vomiting or abdominal pain.  States that she is compliant with her medication and tries to stick to her diet ED course: On arrival, afebrile, BP 128/106, pulse 102 with O2 sat 99% on room air.  Blood work significant for BNP of 841 and flat troponins of 14-15.  Blood sugar elevated at 367, creatinine 1.05.  Hemoglobin 9.6 which is her baseline.   EKG, personally viewed and interpreted: Sinus tachycardia at 108 with nonspecific ST-T wave changes Imaging: Chest x-ray: Enlarging cardiopericardial silhouette concerning for underlying pericardial effusion.  Continued pulmonary vascular congestion and interstitial edema  Patient given IV Lasix in the emergency room.  Hospitalist consulted for admission.  Review of Systems: As per HPI otherwise all other systems on review of systems negative.    Past Medical History:  Diagnosis Date   Acid reflux    CHF (congestive heart failure) ([place])    Diabetes mellitus ([place])    Obesity     Past Surgical History:  Procedure  Laterality Date   CHOLECYSTECTOMY     HERNIA REPAIR       reports that she has quit smoking. Her smoking use included cigarettes. She smoked an average of 0.50 packs per day. She has never used smokeless tobacco. She reports previous alcohol use. She reports previous drug use. Drug: Cocaine.  Allergies  Allergen Reactions   No [facility] Food Allergies Rash and Other (See Comments)    Lemons    Family History  Problem Relation Age of Onset   Heart failure Mother        a. onset in her 20s   Diabetes Mother    Hypertension Father    Diabetes Father       Prior to Admission medications   Medication Sig Start Date End Date Taking? Authorizing Provider  aspirin EC 81 MG EC tablet Take 1 tablet (81 mg total) by mouth daily. 10/06/19   Raiford Noble Latif, DO  atorvastatin (LIPITOR) 20 MG tablet TAKE ONE TABLET BY MOUTH EVERY DAY 05/22/20 05/22/21  Enzo Bi, MD  BD VEO INSULIN SYRINGE U/F 31G X 15/64" 0.3 ML MISC USE AS DIRECTED WITH INSULIN Patient taking differently: See admin instructions. with insulin 05/22/20 05/22/21    blood glucose meter kit and supplies KIT Dispense based on patient and insurance preference. Use up to four times daily as directed. (FOR ICD-9 250.00, 250.01). 10/05/19   Raiford Noble Latif, DO  calcium carbonate (OS-CAL) 600 MG TABS tablet Take 1 tablet (600 mg total)  by mouth 2 (two) times daily with a meal. 08/06/20   [name], MD  carvedilol (COREG) 6.25 MG tablet TAKE ONE TABLET BY MOUTH 2 TIMES A DAY WITH MEALS 05/22/20 05/22/21  Enzo Bi, MD  insulin aspart protamine- aspart (NOVOLOG MIX 70/30) (70-30) 100 UNIT/ML injection INJECT 10 UNITS INTO THE SKIN 2 TIMES A DAY WITH MEALS 05/22/20 05/22/21  Enzo Bi, MD  insulin isophane & regular human (NOVOLIN 70/30 FLEXPEN RELION) (70-30) 100 UNIT/ML KwikPen Inject 10 Units into the skin 2 (two) times daily with a meal. 05/22/20 08/20/20  Enzo Bi, MD  Insulin Pen Needle (RELION MINI PEN NEEDLES) 31G X 6 MM MISC 1  Container by Does not apply route in the morning and at bedtime. 10/05/19   Raiford Noble Latif, DO  iron polysaccharides (NIFEREX) 150 MG capsule TAKE ONE CAPSULE BY MOUTH EVERY DAY 05/22/20 05/22/21  Enzo Bi, MD  lisinopril (ZESTRIL) 5 MG tablet TAKE ONE TABLET BY MOUTH EVERY DAY 05/22/20 05/22/21  Enzo Bi, MD  metolazone (ZAROXOLYN) 5 MG tablet TAKE ONE TABLET BY MOUTH EVERY DAY 1 HR PRIOR TO TAKING TORSEMIDE 07/18/20 07/18/21  [name], MD  Potassium Chloride 40 MEQ/15ML (20%) SOLN Take 40 mEq by mouth 2 (two) times daily. 07/04/20   [name], MD  potassium chloride SA (KLOR-CON) 20 MEQ tablet TAKE 2 TABLETS (40 MEQ TOTAL) BY MOUTH TWICE DAILY. 07/04/20 07/04/21  [name], MD  torsemide (DEMADEX) 20 MG tablet TAKE 4 TABLETS (80 MG) BY MOUTH 2 TIMES A DAY. STOP TAKING LASIX/FUROSEMIDE. 07/18/20 07/18/21  [name], MD  Torsemide 40 MG TABS Take 40 mg by mouth 2 (two) times daily. 09/11/20 12/10/20  [name], MD  zolpidem (AMBIEN) 5 MG tablet Take 1 tablet (5 mg total) by mouth at bedtime as needed for up to 14 days for sleep. 07/22/20 08/05/20  [name], MD    Physical Exam: Vitals:   10/10/20 1746 10/10/20 2052 10/10/20 2219 10/11/20 0031  BP: (!) 129/102 (!) 128/100 (!) 128/106 (!) 95/57  Pulse: (!) 106 (!) 108 (!) 102 (!) 105  Resp: '17 20 20 20  ' Temp: 98.6 F (37 C)  98.9 F (37.2 C)   TempSrc: Oral  Oral   SpO2: 97% 97% 99% 96%  Weight:      Height:         Vitals:   10/10/20 1746 10/10/20 2052 10/10/20 2219 10/11/20 0031  BP: (!) 129/102 (!) 128/100 (!) 128/106 (!) 95/57  Pulse: (!) 106 (!) 108 (!) 102 (!) 105  Resp: '17 20 20 20  ' Temp: 98.6 F (37 C)  98.9 F (37.2 C)   TempSrc: Oral  Oral   SpO2: 97% 97% 99% 96%  Weight:      Height:          Constitutional: Alert and oriented x 3 . Not in any apparent distress HEENT:      Head: Normocephalic and atraumatic.         Eyes: PERLA, EOMI, Conjunctivae are normal. Sclera is  non-icteric.       Mouth/Throat: Mucous membranes are moist.       Neck: Supple with no signs of meningismus. Cardiovascular: Regular rate and rhythm. No murmurs, gallops, or rubs. 2+ symmetrical distal pulses are present . No JVD.  3+ LE edema Respiratory: Respiratory effort increased.Lungs sounds diminished bilaterally.  Bilateral rales  gastrointestinal: Soft, non tender, mildly distended, with positive bowel sounds.  Genitourinary: No CVA tenderness. Musculoskeletal: Nontender with normal range  of motion in all extremities. No cyanosis, or erythema of extremities. Neurologic:  Face is symmetric. Moving all extremities. No gross focal neurologic deficits . Skin: Skin is warm, dry.  No rash or ulcers Psychiatric: Mood and affect are normal    Labs on Admission: I have personally reviewed following labs and imaging studies  CBC: Recent Labs  Lab 10/10/20 1312  WBC 5.1  HGB 9.6*  HCT 31.9*  MCV 84.6  PLT 176*   Basic Metabolic Panel: Recent Labs  Lab 10/10/20 1312  NA 134*  K 4.2  CL 92*  CO2 32  GLUCOSE 367*  BUN 20  CREATININE 1.05*  CALCIUM 7.3*   GFR: Estimated Creatinine Clearance: 92 mL/min (A) (by C-G formula based on SCr of 1.05 mg/dL (H)). Liver Function Tests: No results for input(s): AST, ALT, ALKPHOS, BILITOT, PROT, ALBUMIN in the last 168 hours. No results for input(s): LIPASE, AMYLASE in the last 168 hours. No results for input(s): AMMONIA in the last 168 hours. Coagulation Profile: No results for input(s): INR, PROTIME in the last 168 hours. Cardiac Enzymes: No results for input(s): CKTOTAL, CKMB, CKMBINDEX, TROPONINI in the last 168 hours. BNP (last 3 results) No results for input(s): PROBNP in the last 8760 hours. HbA1C: No results for input(s): HGBA1C in the last 72 hours. CBG: No results for input(s): GLUCAP in the last 168 hours. Lipid Profile: No results for input(s): CHOL, HDL, LDLCALC, TRIG, CHOLHDL, LDLDIRECT in the last 72  hours. Thyroid Function Tests: No results for input(s): TSH, T4TOTAL, FREET4, T3FREE, THYROIDAB in the last 72 hours. Anemia Panel: No results for input(s): VITAMINB12, FOLATE, FERRITIN, TIBC, IRON, RETICCTPCT in the last 72 hours. Urine analysis: No results found for: COLORURINE, APPEARANCEUR, LABSPEC, [place], GLUCOSEU, HGBUR, BILIRUBINUR, KETONESUR, PROTEINUR, UROBILINOGEN, NITRITE, LEUKOCYTESUR  Radiological Exams on Admission: DG Chest 2 View  Result Date: 10/10/2020 CLINICAL DATA:  Shortness of breath.  Fluid retention. EXAM: CHEST - 2 VIEW COMPARISON:  Chest x-ray dated May 19, 2020. FINDINGS: Cardiopericardial silhouette appears enlarged compared to the prior study. Continued pulmonary vascular congestion and interstitial edema. Right lower lobe atelectasis. No focal consolidation, pleural effusion, or pneumothorax. No acute osseous abnormality. IMPRESSION: 1. Enlarging cardiopericardial silhouette concerning for underlying pericardial effusion. 2. Continued pulmonary vascular congestion and interstitial edema. Electronically Signed   By: Titus Dubin M.D.   On: 10/10/2020 14:26     Assessment/Plan 35 year old female with DM, HTN, chronic combined heart failure last EF 20 to 25% in January 2022, w presenting with fluid retention for the past month now associated with shortness of breath with exertion and orthopnea.     Acute on chronic combined systolic and diastolic CHF   NICM (nonischemic cardiomyopathy) ([place])   Anasarca    Pericardial effusion on chest x-ray - Patient with generalized edema, dyspnea on exertion and orthopnea since reduction in torsemide dose a month prior - Last EF 20 to 25% January 2022 - BNP 841, troponin 14/15, EKG nonacute - Chest x-ray: Enlarging cardiopericardial silhouette concerning for underlying pericardial effusion - IV Lasix - Continue home lisinopril, Coreg and metolazone - Echocardiogram to evaluate pericardial effusion - Daily weights with  salt and fluid restriction and intake and output monitoring - Cardiology consult    Hyperglycemia due to type 2 diabetes mellitus (HCC) - Blood sugar 367 -Sliding scale insulin coverage    Essential hypertension - Controlled.  Continue home lisinopril and Coreg    Chronic anemia - Stable.  Hemoglobin 9.6 which is about most recent baseline  DVT prophylaxis: Lovenox  Code Status: full code  Family Communication:  none  Disposition Plan: Back to previous home environment Consults called: Cardiology Status:At the time of admission, it appears that the appropriate admission status for this patient is INPATIENT. This is judged to be reasonable and necessary in order to provide the required intensity of service to ensure the patient's safety given the presenting symptoms, physical exam findings, and initial radiographic and laboratory data in the context of their  Comorbid conditions.   Patient requires inpatient status due to high intensity of service, high risk for further deterioration and high frequency of surveillance required.   I certify that at the point of admission it is my clinical judgment that the patient will require inpatient hospital care spanning beyond [place] MD [facility]     10/11/2020, 1:41 AM

## 2020-10-11 NOTE — ED Notes (Signed)
Lab called to collect morning labwork °

## 2020-10-11 NOTE — ED Notes (Signed)
Pt noted to be resting comfortably in bed, NAD noted, RR even and unlabored at this time. Pt denies further needs.

## 2020-10-11 NOTE — ED Notes (Signed)
Pt attempted to make it to bedside commode but was unsuccessful. Pt agreed to [facility] placement. Bed Molly [facility] changed. Pt provided with mesh underwear and wipes to clean herself up. Purewick placed. Pt underwear placed into pt belonging bag. Pt covered with blankets. Lights dimmed for rest. Denies further needs at this time.

## 2020-10-11 NOTE — Progress Notes (Signed)
*  PRELIMINARY RESULTS* Echocardiogram 2D Echocardiogram has been performed.  Lenor Coffin 10/11/2020, 10:26 AM

## 2020-10-11 NOTE — ED Notes (Signed)
Pt noted to be resting in bed at this time, NAD noted, RR even and unlabored. Pt denies further needs at this time.

## 2020-10-11 NOTE — Consult Note (Addendum)
Cardiology Consultation:   Patient ID: Molly Howard MRN: [id number]; DOB: 07-Oct-1985  Admit date: 10/10/2020 Date of Consult: 10/11/2020  PCP:  Patient, No Pcp Per (Inactive)   [facility] Providers Cardiologist:  [name], MD       Patient Profile:   Molly Howard is a 35 y.o. female with a hx of HFrEF, NICM EF 20-25 05/2020), diabetes, GERD, hyperlipidemia, former smoker, substance abuse (cocaine), tobacco use, and anemia who is being seen 10/11/2020 for the evaluation of acute heart failure at the request of Dr. Kurtis Bushman.  History of Present Illness:   Molly Howard is followed by Dr. Garen Lah for the above cardiac issues.  Patient seen in the ED 04/22/2019 with chest pain thought to be GERD but recommended cardiology follow-up, however this was not completed.  She presented to [facility] 10/21/2019 for swelling, shortness of breath, PND, dyspnea on exertion.  During admission she required 2 units PRBCs due to anemia, echo showed LVEF 35 to 52%, grade 2 diastolic dysfunction.  She was diuresed.  She underwent Lexiscan Myoview to rule out ischemia which was a low risk study.  She did test positive for cocaine during admission.  Discharge weight was 232 pounds.In follow-up 10/31/2019 and was not taking any cardiac medications due to financial barriers.  He was started on Lasix 40 and Coreg 6.25 mg daily.  She was readmitted 05/19/2020-05/22/2020 with heart failure exacerbation and COVID-positive.  Weight on admission was 200 pounds.  No discharge weight reported.  She was also positive for cocaine on admission.  She had an echo 05/20/2020 showing LVEF 20 to 25%, LV mildly dilated, LV global hypokinesis, RV mildly enlarged, severely elevated PASP, trivial MR, RA pressure 15 mmHg.  He was provided with her medications and referred to [facility].  Cardiology was not consulted during this admission.  He was seen in follow-up however 06/13/2020 and reported compliance with her cardiac  medications.  She was volume overloaded and given high-dose Lasix.  Has been difficult to continue guide directed medical therapy with Entresto and SGLT2 inhibitor due to lack of insurance.  On follow-up 06/20/2020 Lasix was stopped and she was started on torsemide 40 mg twice daily.  At follow-up 3-4 22 torsemide was increased to 80 mg twice daily.  On 07/18/2020 she was started on metolazone 5 mg daily.  At follow-up 07/22/2020 symptoms seem to be improving.  Consideration of cardiac MRI after diuresing.  Last seen 09/05/2020 and still had some shortness of breath.  Reported lack of menses for couple months and ECP follow-up was recommended.  Labs showed elevated creatinine and torsemide was later reduced to 40 mg twice daily.   Presented to the ER 10/10/2020 for lower leg edema.  Patient reported worsening symptoms since torsemide was decreased to 40 mg twice daily. Started noticing volume overload about 2 weeks ago. Reported LLE, shortness of breath, coughing. No chest pain. No recent fever, chills, nausea, vomiting. Reports compliance with medications. Doesn't eat a lot of salt. Eats a lot of ice. Denies recent drug use. Lives with mother, has been using cane/walker since swelling got worse. Reports weights at home, 275lbs most recent number, dry weights around 230lbs.  In the ER blood pressure low at 97/80, pulse 109, respiratory rate 20, O2 95%.  Showed sodium 134, creatinine 1.05, hemoglobin 9.6.  BNP 841.  Troponin negative x2.  EKG with ST, 108bpm, low voltage, no ischemic changes. Chest x-ray showed enlarging cardiopericardial silhouette concerning for underlying pericardial effusion, continued pulmonary vascular  congestion and interstitial edema.  No evidence of tamponade.  She was given IV Lasix 100 mg and admitted for further work-up.   Past Medical History:  Diagnosis Date   Acid reflux    CHF (congestive heart failure) (HCC)    Diabetes mellitus ([place])    Obesity     Past Surgical History:   Procedure Laterality Date   CHOLECYSTECTOMY     HERNIA REPAIR       Home Medications:  Prior to Admission medications   Medication Sig Start Date End Date Taking? Authorizing Provider  aspirin EC 81 MG EC tablet Take 1 tablet (81 mg total) by mouth daily. 10/06/19   Raiford Noble Latif, DO  atorvastatin (LIPITOR) 20 MG tablet TAKE ONE TABLET BY MOUTH EVERY DAY 05/22/20 05/22/21  Enzo Bi, MD  BD VEO INSULIN SYRINGE U/F 31G X 15/64" 0.3 ML MISC USE AS DIRECTED WITH INSULIN Patient taking differently: See admin instructions. with insulin 05/22/20 05/22/21    blood glucose meter kit and supplies KIT Dispense based on patient and insurance preference. Use up to four times daily as directed. (FOR ICD-9 250.00, 250.01). 10/05/19   Raiford Noble Latif, DO  calcium carbonate (OS-CAL) 600 MG TABS tablet Take 1 tablet (600 mg total) by mouth 2 (two) times daily with a meal. 08/06/20   [name], MD  carvedilol (COREG) 6.25 MG tablet TAKE ONE TABLET BY MOUTH 2 TIMES A DAY WITH MEALS 05/22/20 05/22/21  Enzo Bi, MD  insulin aspart protamine- aspart (NOVOLOG MIX 70/30) (70-30) 100 UNIT/ML injection INJECT 10 UNITS INTO THE SKIN 2 TIMES A DAY WITH MEALS 05/22/20 05/22/21  Enzo Bi, MD  insulin isophane & regular human (NOVOLIN 70/30 FLEXPEN RELION) (70-30) 100 UNIT/ML KwikPen Inject 10 Units into the skin 2 (two) times daily with a meal. 05/22/20 08/20/20  Enzo Bi, MD  Insulin Pen Needle (RELION MINI PEN NEEDLES) 31G X 6 MM MISC 1 Container by Does not apply route in the morning and at bedtime. 10/05/19   Raiford Noble Latif, DO  iron polysaccharides (NIFEREX) 150 MG capsule TAKE ONE CAPSULE BY MOUTH EVERY DAY 05/22/20 05/22/21  Enzo Bi, MD  lisinopril (ZESTRIL) 5 MG tablet TAKE ONE TABLET BY MOUTH EVERY DAY 05/22/20 05/22/21  Enzo Bi, MD  metolazone (ZAROXOLYN) 5 MG tablet TAKE ONE TABLET BY MOUTH EVERY DAY 1 HR PRIOR TO TAKING TORSEMIDE 07/18/20 07/18/21  [name], MD  Potassium Chloride 40 MEQ/15ML  (20%) SOLN Take 40 mEq by mouth 2 (two) times daily. 07/04/20   [name], MD  potassium chloride SA (KLOR-CON) 20 MEQ tablet TAKE 2 TABLETS (40 MEQ TOTAL) BY MOUTH TWICE DAILY. 07/04/20 07/04/21  [name], MD  torsemide (DEMADEX) 20 MG tablet TAKE 4 TABLETS (80 MG) BY MOUTH 2 TIMES A DAY. STOP TAKING LASIX/FUROSEMIDE. 07/18/20 07/18/21  [name], MD  Torsemide 40 MG TABS Take 40 mg by mouth 2 (two) times daily. 09/11/20 12/10/20  [name], MD  zolpidem (AMBIEN) 5 MG tablet Take 1 tablet (5 mg total) by mouth at bedtime as needed for up to 14 days for sleep. 07/22/20 08/05/20  [name], MD    Inpatient Medications: Scheduled Meds:  carvedilol  6.25 mg Oral BID WC   enoxaparin (LOVENOX) injection  0.5 mg/kg Subcutaneous Q24H   insulin aspart  0-20 Units Subcutaneous TID WC   insulin aspart  0-5 Units Subcutaneous QHS   lisinopril  5 mg Oral Daily   metolazone  5 mg Oral Daily  sodium chloride flush  3 mL Intravenous Q12H   Continuous Infusions:  sodium chloride     furosemide Stopped (10/11/20 0923)   PRN Meds: sodium chloride, acetaminophen, ondansetron (ZOFRAN) IV, sodium chloride flush  Allergies:    Allergies  Allergen Reactions   No [employer] Food Allergies Rash and Other (See Comments)    Lemons    Social History:   Social History   Socioeconomic History   Marital status: Single    Spouse name: Not on file   Number of children: Not on file   Years of education: Not on file   Highest education level: Not on file  Occupational History   Not on file  Tobacco Use   Smoking status: Former    Packs/day: 0.50    Pack years: 0.00    Types: Cigarettes   Smokeless tobacco: Never  Vaping Use   Vaping Use: Never used  Substance and Sexual Activity   Alcohol use: Not Currently    Comment: ~ 4 shots/day on weekends only   Drug use: Not Currently    Types: Cocaine    Comment: Last day of use was prior to June 2021 hosp.visit.     Sexual activity: Not on file  Other Topics Concern   Not on file  Social History Narrative   Not on file   Social Determinants of Health   Financial Resource Strain: High Risk   Difficulty of Paying Living Expenses: Hard  Food Insecurity: Food Insecurity Present   Worried About Running Out of Food in the Last Year: Often true   Arboriculturist in the Last Year: Often true  Transportation Needs: Public librarian (Medical): Yes   Lack of Transportation (Non-Medical): Yes  Physical Activity: Not on file  Stress: Not on file  Social Connections: Not on file  Intimate Partner Violence: Not on file    Family History:    Family History  Problem Relation Age of Onset   Heart failure Mother        a. onset in her 8s   Diabetes Mother    Hypertension Father    Diabetes Father      ROS:  Please see the history of present illness.   All other ROS reviewed and negative.     Physical Exam/Data:   Vitals:   10/11/20 0630 10/11/20 0825 10/11/20 0830 10/11/20 0900  BP: (!) 133/94 (!) 141/110 (!) 115/95 104/73  Pulse: (!) 101  100 100  Resp: 20   20  Temp:      TempSrc:      SpO2: 98%  97% 96%  Weight:      Height:        Intake/Output Summary (Last 24 hours) at 10/11/2020 1217 Last data filed at 10/11/2020 1036 Gross per 24 hour  Intake --  Output 1150 ml  Net -1150 ml   Last 3 Weights 10/10/2020 10/10/2020 09/05/2020  Weight (lbs) 230 lb 237 lb 264 lb 3.2 oz  Weight (kg) 104.327 kg 107.502 kg 119.84 kg     Body mass index is 36.02 kg/m.  General:  Well nourished, well developed, in no acute distress HEENT: normal Lymph: no adenopathy Neck: difficult to assess JVD Endocrine:  No thryomegaly Vascular: No carotid bruits; FA pulses 2+ bilaterally without bruits  Cardiac:  normal S1, S2; RRR; no murmur  Lungs: diminished at bases Abd: firm, nontender, no hepatomegaly  Ext: 2+ edema, anasarca Musculoskeletal:  No deformities, BUE  and  BLE strength normal and equal Skin: warm and dry  Neuro:  CNs 2-12 intact, no focal abnormalities noted Psych:  Normal affect   EKG:  The EKG was personally reviewed and demonstrates:  ST, 108bpm, low voltage, LPFB, nonspecific T wave changes Telemetry:  Telemetry was personally reviewed and demonstrates:  SR, HR 80-90  Relevant CV Studies:  Echo 10/11/20   1. Left ventricular ejection fraction, by estimation, is 30 to 35%. The  left ventricle has moderate to severely decreased function. The left  ventricle demonstrates global hypokinesis. Left ventricular diastolic  parameters are consistent with Grade II  diastolic dysfunction (pseudonormalization).   2. Right ventricular systolic function is mildly reduced. The right  ventricular size is normal.   3. A small pericardial effusion is present.   4. The mitral valve is normal in structure. No evidence of mitral valve  regurgitation.   5. Tricuspid valve regurgitation is moderate.   6. The aortic valve is tricuspid. Aortic valve regurgitation is not  visualized.   Echo 05/20/20  1. Left ventricular ejection fraction, by estimation, is 20 to 25%. The  left ventricle has severely decreased function. The left ventricle  demonstrates global hypokinesis. The left ventricular internal cavity size  was mildly dilated. Indeterminate  diastolic filling due to E-A fusion.   2. Right ventricular systolic function is normal. The right ventricular  size is mildly enlarged. There is severely elevated pulmonary artery  systolic pressure.   3. The mitral valve is normal in structure. Trivial mitral valve  regurgitation. No evidence of mitral stenosis.   4. Tricuspid valve regurgitation is moderate.   5. The aortic valve was not well visualized. Aortic valve regurgitation  is not visualized. No aortic stenosis is present.   6. The inferior vena cava is dilated in size with <50% respiratory  variability, suggesting right atrial pressure of 15 mmHg.    Myoview stress test 10/2019 There was no ST segment deviation noted during stress. T wave inversion was noted during stress in the I and aVL leads. Defect 1: There is a small defect of mild severity present in the apex location. This is likely due to breast attenuation artifact Nuclear stress EF: 30%. This is a high risk study due to low EF. Low EF seems to be due to nonischemic cardiomyopathy.  Laboratory Data:  High Sensitivity Troponin:   Recent Labs  Lab 10/10/20 1312 10/10/20 2252  TROPONINIHS 14 15     Chemistry Recent Labs  Lab 10/10/20 1312 10/11/20 0417  NA 134* 133*  K 4.2 3.6  CL 92* 92*  CO2 32 32  GLUCOSE 367* 332*  BUN 20 19  CREATININE 1.05* 0.99  CALCIUM 7.3* 7.4*  GFRNONAA >60 >60  ANIONGAP 10 9    No results for input(s): PROT, ALBUMIN, AST, ALT, ALKPHOS, BILITOT in the last 168 hours. Hematology Recent Labs  Lab 10/10/20 1312  WBC 5.1  RBC 3.77*  HGB 9.6*  HCT 31.9*  MCV 84.6  MCH 25.5*  MCHC 30.1  RDW 17.1*  PLT 407*   BNP Recent Labs  Lab 10/10/20 1312  BNP 841.3*    DDimer No results for input(s): DDIMER in the last 168 hours.   Radiology/Studies:  DG Chest 2 View  Result Date: 10/10/2020 CLINICAL DATA:  Shortness of breath.  Fluid retention. EXAM: CHEST - 2 VIEW COMPARISON:  Chest x-ray dated May 19, 2020. FINDINGS: Cardiopericardial silhouette appears enlarged compared to the prior study. Continued pulmonary vascular congestion and interstitial  edema. Right lower lobe atelectasis. No focal consolidation, pleural effusion, or pneumothorax. No acute osseous abnormality. IMPRESSION: 1. Enlarging cardiopericardial silhouette concerning for underlying pericardial effusion. 2. Continued pulmonary vascular congestion and interstitial edema. Electronically Signed   By: Titus Dubin M.D.   On: 10/10/2020 14:26   ECHOCARDIOGRAM COMPLETE  Result Date: 10/11/2020    ECHOCARDIOGRAM REPORT   Patient Name:   SHYLEE DURRETT Date of  Exam: 10/11/2020 Medical Rec #:  [id number]     Height:       67.0 in Accession #:    [id number]    Weight:       230.0 lb Date of Birth:  04-12-1986     BSA:          2.146 m Patient Age:    89 years      BP:           133/94 mmHg Patient Gender: F             HR:           93 bpm. Exam Location:  [facility] Procedure: 2D Echo, 3D Echo and Strain Analysis Indications:     pERICARDIAL EFFUSION I31.3  History:         Patient has prior history of Echocardiogram examinations, most                  recent 05/20/2020.  Sonographer:     Kathlen Brunswick Referring Phys:  [id number] Athena Masse Diagnosing Phys: [name] MD  Sonographer Comments: Global longitudinal strain was attempted. IMPRESSIONS  1. Left ventricular ejection fraction, by estimation, is 30 to 35%. The left ventricle has moderate to severely decreased function. The left ventricle demonstrates global hypokinesis. Left ventricular diastolic parameters are consistent with Grade II diastolic dysfunction (pseudonormalization).  2. Right ventricular systolic function is mildly reduced. The right ventricular size is normal.  3. A small pericardial effusion is present.  4. The mitral valve is normal in structure. No evidence of mitral valve regurgitation.  5. Tricuspid valve regurgitation is moderate.  6. The aortic valve is tricuspid. Aortic valve regurgitation is not visualized. FINDINGS  Left Ventricle: Left ventricular ejection fraction, by estimation, is 30 to 35%. The left ventricle has moderate to severely decreased function. The left ventricle demonstrates global hypokinesis. Global longitudinal strain performed but not reported based on interpreter judgement due to suboptimal tracking. 3D left ventricular ejection fraction analysis performed but not reported based on interpreter judgement due to suboptimal quality. The left ventricular internal cavity size was normal in size. There is no left ventricular hypertrophy. Left ventricular diastolic parameters  are consistent with Grade II diastolic dysfunction (pseudonormalization). Right Ventricle: The right ventricular size is normal. No increase in right ventricular wall thickness. Right ventricular systolic function is mildly reduced. Left Atrium: Left atrial size was normal in size. Right Atrium: Right atrial size was normal in size. Pericardium: A small pericardial effusion is present. Mitral Valve: The mitral valve is normal in structure. No evidence of mitral valve regurgitation. Tricuspid Valve: The tricuspid valve is normal in structure. Tricuspid valve regurgitation is moderate. Aortic Valve: The aortic valve is tricuspid. Aortic valve regurgitation is not visualized. Aortic valve peak gradient measures 11.8 mmHg. Pulmonic Valve: The pulmonic valve was normal in structure. Pulmonic valve regurgitation is mild. Aorta: The aortic root is normal in size and structure. Venous: The inferior vena cava was not well visualized. IAS/Shunts: No atrial level shunt detected by color flow Doppler.  LEFT  VENTRICLE PLAX 2D LVIDd:         5.24 cm      Diastology LVIDs:         4.54 cm      LV e' medial:    5.77 cm/s LV PW:         0.98 cm      LV E/e' medial:  16.0 LV IVS:        0.96 cm      LV e' lateral:   9.36 cm/s LVOT diam:     1.80 cm      LV E/e' lateral: 9.9 LV SV:         43 LV SV Index:   20 LVOT Area:     2.54 cm                              3D Volume EF: LV Volumes (MOD)            3D EF:        41 % LV vol d, MOD A2C: 127.0 ml LV EDV:       202 ml LV vol d, MOD A4C: 160.0 ml LV ESV:       120 ml LV vol s, MOD A2C: 103.0 ml LV SV:        82 ml LV vol s, MOD A4C: 94.3 ml LV SV MOD A2C:     24.0 ml LV SV MOD A4C:     160.0 ml LV SV MOD BP:      44.3 ml RIGHT VENTRICLE RV Basal diam:  3.81 cm RV S prime:     14.00 cm/s TAPSE (M-mode): 2.0 cm LEFT ATRIUM             Index       RIGHT ATRIUM           Index LA diam:        3.00 cm 1.40 cm/m  RA Area:     14.80 cm LA Vol (A2C):   53.1 ml 24.74 ml/m RA Volume:    37.40 ml  17.43 ml/m LA Vol (A4C):   23.3 ml 10.86 ml/m LA Biplane Vol: 35.7 ml 16.64 ml/m  AORTIC VALVE                PULMONIC VALVE AV Area (Vmax): 1.44 cm    PV Vmax:       0.96 m/s AV Vmax:        172.00 cm/s PV Peak grad:  3.7 mmHg AV Peak Grad:   11.8 mmHg LVOT Vmax:      97.50 cm/s LVOT Vmean:     63.000 cm/s LVOT VTI:       0.170 m  AORTA Ao Root diam: 2.60 cm MITRAL VALVE               TRICUSPID VALVE MV Area (PHT): 5.38 cm    TV Peak grad:   26.7 mmHg MV Decel Time: 141 msec    TV Vmax:        2.58 m/s MV E velocity: 92.40 cm/s MV A velocity: 56.30 cm/s  SHUNTS MV E/A ratio:  1.64        Systemic VTI:  0.17 m                            Systemic Diam: 1.80 cm [name]  MD Electronically signed by [name] MD Signature Date/Time: 10/11/2020/11:57:08 AM    Final      Assessment and Plan:   Acute on chronic HFrEF NICM EF 30-35% Anasarca - presents with worsening heart failure symptoms since torsemide does was decreased frommg BID to 37m BID for concerns of worsening kidney function. Also on metolazone 532mdaily. Reports compliance with meds.  Weight up about 40lbs from dry weight. - In the ER BNP  734. HS trop negative x 2. CXR with pericardial effusion and CHF. Given IV lasix 10061mEKG low voltage and ST - prior echo 05/2020 showed EF 20-25% - echo this admission showed LVEF 30-35%, G2DD, global hypokinesis, small pericardial effusion, mod TR. - started on IV lasix drip  - patient reports dry weight around 230lbs, and recently at home it was 275lbs.  - kidney function wnl.  - Albumin 1.9, likely element of third-spacing, consider repletion - monitor daily weights, creatinine, and I/Os with diuresis - continue coreg 6.39m42mD and lisinopril 5mg 64mly - With lack of insurance/financial restraints difficult to progress GDMT - Agree with high dose IV lasix and metolazone - Plan was for cardiac MRI to further evaluate CM, will discuss with MD.   Pericardial effusion -  per CXR - echo this admission showed small pericardial effusion - check CRP and sed rate - repeat limited echo after diuresis  HTN - monitor for hypotension with pericardial effusion - continue metoprolol and lisinopril  Anemia - Hgb at baseline - ASA held  HLD - LDL 153 05/2020 - continue atorvastatin  H/o Substance abuse - check UDS   For questions or updates, please contact [facility] Please consult [email or web address] for contact info under    Signed, Londin Antone H FurNinfa MeekerC  10/11/2020 12:17 PM

## 2020-10-11 NOTE — ED Notes (Signed)
Pt given beverage at this time per request. NAD noted, RR even and unlabored. Pt denies further needs at this time.

## 2020-10-11 NOTE — Progress Notes (Signed)
Patient ID: Molly Howard, female   DOB: July 16, 1985, 35 y.o.   MRN: [id number] This is a no charge note as patient was admitted this AM.  Patient seen and examined H&P reviewed.  Molly Howard is a 35 y.o. 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  She states that a month ago her doctor cut her torsemide dose in half due to concern for her kidney function and since then she started retaining fluid. This a.m. she continues complaining of shortness of breath with minimal improvement.  Nad +rales b/l with decrease bs at bases Regular s1/s2  Soft +fluid +b/l edema  A/p: Acute on chronic combined systolic and diastolic CHF   NICM (nonischemic cardiomyopathy) (HCC)   Anasarca    Pericardial effusion on chest x-ray Volume overloaded Cardiology input was appreciated, increase Lasix to 80 mg twice daily Monitor lites and renal function I's and O's Daily weight

## 2020-10-12 LAB — GLUCOSE, CAPILLARY
Glucose-Capillary: 142 mg/dL — ABNORMAL HIGH (ref 70–99)
Glucose-Capillary: 176 mg/dL — ABNORMAL HIGH (ref 70–99)
Glucose-Capillary: 203 mg/dL — ABNORMAL HIGH (ref 70–99)
Glucose-Capillary: 236 mg/dL — ABNORMAL HIGH (ref 70–99)

## 2020-10-12 LAB — BASIC METABOLIC PANEL
Anion gap: 7 (ref 5–15)
BUN: 19 mg/dL (ref 6–20)
CO2: 36 mmol/L — ABNORMAL HIGH (ref 22–32)
Calcium: 7.3 mg/dL — ABNORMAL LOW (ref 8.9–10.3)
Chloride: 92 mmol/L — ABNORMAL LOW (ref 98–111)
Creatinine, Ser: 0.94 mg/dL (ref 0.44–1.00)
GFR, Estimated: >60 mL/min (ref >60)
Glucose, Bld: 117 mg/dL — ABNORMAL HIGH (ref 70–99)
Potassium: 3.3 mmol/L — ABNORMAL LOW (ref 3.5–5.1)
Sodium: 135 mmol/L (ref 135–145)

## 2020-10-12 LAB — MAGNESIUM: Magnesium: 1.6 mg/dL — ABNORMAL LOW (ref 1.7–2.4)

## 2020-10-12 MED ORDER — MAGNESIUM SULFATE 2 GM/50ML IV SOLN
2.0000 g | Freq: Once | INTRAVENOUS | Status: AC
  Administered 2020-10-12: 2 g via INTRAVENOUS
  Filled 2020-10-12: qty 50

## 2020-10-12 MED ORDER — ENOXAPARIN SODIUM 80 MG/0.8ML IJ SOSY
0.5000 mg/kg | PREFILLED_SYRINGE | INTRAMUSCULAR | Status: DC
  Administered 2020-10-13 – 2020-10-16 (×4): 67.5 mg via SUBCUTANEOUS
  Filled 2020-10-12 (×4): qty 0.8

## 2020-10-12 MED ORDER — POTASSIUM CHLORIDE CRYS ER 20 MEQ PO TBCR
40.0000 meq | EXTENDED_RELEASE_TABLET | Freq: Every day | ORAL | Status: DC
  Administered 2020-10-12 – 2020-10-13 (×2): 40 meq via ORAL
  Filled 2020-10-12 (×2): qty 2

## 2020-10-12 NOTE — Plan of Care (Signed)

## 2020-10-12 NOTE — Progress Notes (Signed)
PROGRESS NOTE    Molly Howard  EKC:[id number] DOB: 1986/03/17 DOA: 10/10/2020 PCP: Patient, No Pcp Per (Inactive)    Brief Narrative:  Molly Howard is a 35 y.o. 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  She stated that a month ago her doctor cut her torsemide dose in half due to concern for her kidney function and since then she started retaining fluid. Admitted for CHF  6/12-breathing a little better. Not close to baselien  Consultants:  cardiology  Procedures:   Antimicrobials:      Subjective: No cp, no abd pain. Good UO.  Objective: Vitals:   10/12/20 0003 10/12/20 0358 10/12/20 0640 10/12/20 0804  BP: (!) 110/58 112/84  114/80  Pulse: 88 89  88  Resp: 20 16  18   Temp: 97.9 F (36.6 C) 97.8 F (36.6 C)  98.2 F (36.8 C)  TempSrc: Oral   Oral  SpO2: 94% 98%  94%  Weight:   135.6 kg   Height:        Intake/Output Summary (Last 24 hours) at 10/12/2020 0815 Last data filed at 10/11/2020 2330 Gross per 24 hour  Intake 220 ml  Output 1950 ml  Net -1730 ml   Filed Weights   10/10/20 1253 10/10/20 1311 10/12/20 0640  Weight: 107.5 kg 104.3 kg 135.6 kg    Examination:  General exam: Appears calm and comfortable  Respiratory system: +crackles, no wheezing Cardiovascular system: S1 & S2 heard, RRR. No gallop Gastrointestinal system: Abdomen is nondistended, soft and nontender. Normal bowel sounds heard. Central nervous system: Alert and oriented. Grossly intact Extremities: +edema b/l Skin: warm, dry Psychiatry: Judgement and insight appear normal. Mood & affect appropriate.     Data Reviewed: I have personally reviewed following labs and imaging studies  CBC: Recent Labs  Lab 10/10/20 1312  WBC 5.1  HGB 9.6*  HCT 31.9*   MCV 84.6  PLT 407*   Basic Metabolic Panel: Recent Labs  Lab 10/10/20 1312 10/11/20 0417 10/12/20 0518  NA 134* 133* 135  K 4.2 3.6 3.3*  CL 92* 92* 92*  CO2 32 32 36*  GLUCOSE 367* 332* 117*  BUN 20 19 19   CREATININE 1.05* 0.99 0.94  CALCIUM 7.3* 7.4* 7.3*  MG  --   --  1.6*   GFR: Estimated Creatinine Clearance: 119.1 mL/min (by C-G formula based on SCr of 0.94 mg/dL). Liver Function Tests: No results for input(s): AST, ALT, ALKPHOS, BILITOT, PROT, ALBUMIN in the last 168 hours. No results for input(s): LIPASE, AMYLASE in the last 168 hours. No results for input(s): AMMONIA in the last 168 hours. Coagulation Profile: No results for input(s): INR, PROTIME in the last 168 hours. Cardiac Enzymes: No results for input(s): CKTOTAL, CKMB, CKMBINDEX, TROPONINI in the last 168 hours. BNP (last 3 results) No results for input(s): PROBNP in the last 8760 hours. HbA1C: No results for input(s): HGBA1C in the last 72 hours. CBG: Recent Labs  Lab 10/11/20 0822 10/11/20 1155 10/11/20 1627 10/11/20 2100 10/12/20 0805  GLUCAP 248* 159* 177* 168* 142*   Lipid Profile: No results for input(s): CHOL, HDL, LDLCALC, TRIG, CHOLHDL, LDLDIRECT in the last 72 hours. Thyroid Function Tests: No results for input(s): TSH, T4TOTAL, FREET4, T3FREE, THYROIDAB in the last 72 hours. Anemia Panel: No  results for input(s): VITAMINB12, FOLATE, FERRITIN, TIBC, IRON, RETICCTPCT in the last 72 hours. Sepsis Labs: No results for input(s): PROCALCITON, LATICACIDVEN in the last 168 hours.  Recent Results (from the past 240 hour(s))  Resp Panel by RT-PCR (Flu A&B, Covid) Nasopharyngeal Swab     Status: None   Collection Time: 10/11/20 12:29 AM   Specimen: Nasopharyngeal Swab; Nasopharyngeal(NP) swabs in vial transport medium  Result Value Ref Range Status   SARS Coronavirus 2 by RT PCR NEGATIVE NEGATIVE Final    Comment: (NOTE) SARS-CoV-2 target nucleic acids are NOT DETECTED.  The SARS-CoV-2  RNA is generally detectable in upper respiratory specimens during the acute phase of infection. The lowest concentration of SARS-CoV-2 viral copies this assay can detect is 138 copies/mL. A negative result does not preclude SARS-Cov-2 infection and should not be used as the sole basis for treatment or other patient management decisions. A negative result may occur with  improper specimen collection/handling, submission of specimen other than nasopharyngeal swab, presence of viral mutation(s) within the areas targeted by this assay, and inadequate number of viral copies(<138 copies/mL). A negative result must be combined with clinical observations, patient history, and epidemiological information. The expected result is Negative.  Fact Sheet for Patients:  BloggerCourse.com  Fact Sheet for Healthcare Providers:  SeriousBroker.it  This test is no t yet approved or cleared by the [place] FDA and  has been authorized for detection and/or diagnosis of SARS-CoV-2 by FDA under an Emergency Use Authorization (EUA). This EUA will remain  in effect (meaning this test can be used) for the duration of the COVID-19 declaration under Section 564(b)(1) of the Act, 21 U.S.C.section 360bbb-3(b)(1), unless the authorization is terminated  or revoked sooner.       Influenza A by PCR NEGATIVE NEGATIVE Final   Influenza B by PCR NEGATIVE NEGATIVE Final    Comment: (NOTE) The Xpert Xpress SARS-CoV-2/FLU/RSV plus assay is intended as an aid in the diagnosis of influenza from Nasopharyngeal swab specimens and should not be used as a sole basis for treatment. Nasal washings and aspirates are unacceptable for Xpert Xpress SARS-CoV-2/FLU/RSV testing.  Fact Sheet for Patients: BloggerCourse.com  Fact Sheet for Healthcare Providers: SeriousBroker.it  This test is not yet approved or cleared by the  [place] FDA and has been authorized for detection and/or diagnosis of SARS-CoV-2 by FDA under an Emergency Use Authorization (EUA). This EUA will remain in effect (meaning this test can be used) for the duration of the COVID-19 declaration under Section 564(b)(1) of the Act, 21 U.S.C. section 360bbb-3(b)(1), unless the authorization is terminated or revoked.  Performed at [facility], [address] [place], [place] [address]          Radiology Studies: DG Chest 2 View  Result Date: 10/10/2020 CLINICAL DATA:  Shortness of breath.  Fluid retention. EXAM: CHEST - 2 VIEW COMPARISON:  Chest x-ray dated May 19, 2020. FINDINGS: Cardiopericardial silhouette appears enlarged compared to the prior study. Continued pulmonary vascular congestion and interstitial edema. Right lower lobe atelectasis. No focal consolidation, pleural effusion, or pneumothorax. No acute osseous abnormality. IMPRESSION: 1. Enlarging cardiopericardial silhouette concerning for underlying pericardial effusion. 2. Continued pulmonary vascular congestion and interstitial edema. Electronically Signed   By: Obie Dredge M.D.   On: 10/10/2020 14:26   ECHOCARDIOGRAM COMPLETE  Result Date: 10/11/2020    ECHOCARDIOGRAM REPORT   Patient Name:   Molly Howard Date of Exam: 10/11/2020 Medical Rec #:  [id number]     Height:  67.0 in Accession #:    [id number]    Weight:       230.0 lb Date of Birth:  July 07, 1985     BSA:          2.146 m Patient Age:    35 years      BP:           133/94 mmHg Patient Gender: F             HR:           93 bpm. Exam Location:  [facility] Procedure: 2D Echo, 3D Echo and Strain Analysis Indications:     pERICARDIAL EFFUSION I31.3  History:         Patient has prior history of Echocardiogram examinations, most                  recent 05/20/2020.  Sonographer:     Overton Mam Referring Phys:  [id number] Andris Baumann Diagnosing Phys: Debbe Odea MD  Sonographer Comments: Global  longitudinal strain was attempted. IMPRESSIONS  1. Left ventricular ejection fraction, by estimation, is 30 to 35%. The left ventricle has moderate to severely decreased function. The left ventricle demonstrates global hypokinesis. Left ventricular diastolic parameters are consistent with Grade II diastolic dysfunction (pseudonormalization).  2. Right ventricular systolic function is mildly reduced. The right ventricular size is normal.  3. A small pericardial effusion is present.  4. The mitral valve is normal in structure. No evidence of mitral valve regurgitation.  5. Tricuspid valve regurgitation is moderate.  6. The aortic valve is tricuspid. Aortic valve regurgitation is not visualized. FINDINGS  Left Ventricle: Left ventricular ejection fraction, by estimation, is 30 to 35%. The left ventricle has moderate to severely decreased function. The left ventricle demonstrates global hypokinesis. Global longitudinal strain performed but not reported based on interpreter judgement due to suboptimal tracking. 3D left ventricular ejection fraction analysis performed but not reported based on interpreter judgement due to suboptimal quality. The left ventricular internal cavity size was normal in size. There is no left ventricular hypertrophy. Left ventricular diastolic parameters are consistent with Grade II diastolic dysfunction (pseudonormalization). Right Ventricle: The right ventricular size is normal. No increase in right ventricular wall thickness. Right ventricular systolic function is mildly reduced. Left Atrium: Left atrial size was normal in size. Right Atrium: Right atrial size was normal in size. Pericardium: A small pericardial effusion is present. Mitral Valve: The mitral valve is normal in structure. No evidence of mitral valve regurgitation. Tricuspid Valve: The tricuspid valve is normal in structure. Tricuspid valve regurgitation is moderate. Aortic Valve: The aortic valve is tricuspid. Aortic valve  regurgitation is not visualized. Aortic valve peak gradient measures 11.8 mmHg. Pulmonic Valve: The pulmonic valve was normal in structure. Pulmonic valve regurgitation is mild. Aorta: The aortic root is normal in size and structure. Venous: The inferior vena cava was not well visualized. IAS/Shunts: No atrial level shunt detected by color flow Doppler.  LEFT VENTRICLE PLAX 2D LVIDd:         5.24 cm      Diastology LVIDs:         4.54 cm      LV e' medial:    5.77 cm/s LV PW:         0.98 cm      LV E/e' medial:  16.0 LV IVS:        0.96 cm      LV e' lateral:   9.36 cm/s LVOT  diam:     1.80 cm      LV E/e' lateral: 9.9 LV SV:         43 LV SV Index:   20 LVOT Area:     2.54 cm                              3D Volume EF: LV Volumes (MOD)            3D EF:        41 % LV vol d, MOD A2C: 127.0 ml LV EDV:       202 ml LV vol d, MOD A4C: 160.0 ml LV ESV:       120 ml LV vol s, MOD A2C: 103.0 ml LV SV:        82 ml LV vol s, MOD A4C: 94.3 ml LV SV MOD A2C:     24.0 ml LV SV MOD A4C:     160.0 ml LV SV MOD BP:      44.3 ml RIGHT VENTRICLE RV Basal diam:  3.81 cm RV S prime:     14.00 cm/s TAPSE (M-mode): 2.0 cm LEFT ATRIUM             Index       RIGHT ATRIUM           Index LA diam:        3.00 cm 1.40 cm/m  RA Area:     14.80 cm LA Vol (A2C):   53.1 ml 24.74 ml/m RA Volume:   37.40 ml  17.43 ml/m LA Vol (A4C):   23.3 ml 10.86 ml/m LA Biplane Vol: 35.7 ml 16.64 ml/m  AORTIC VALVE                PULMONIC VALVE AV Area (Vmax): 1.44 cm    PV Vmax:       0.96 m/s AV Vmax:        172.00 cm/s PV Peak grad:  3.7 mmHg AV Peak Grad:   11.8 mmHg LVOT Vmax:      97.50 cm/s LVOT Vmean:     63.000 cm/s LVOT VTI:       0.170 m  AORTA Ao Root diam: 2.60 cm MITRAL VALVE               TRICUSPID VALVE MV Area (PHT): 5.38 cm    TV Peak grad:   26.7 mmHg MV Decel Time: 141 msec    TV Vmax:        2.58 m/s MV E velocity: 92.40 cm/s MV A velocity: 56.30 cm/s  SHUNTS MV E/A ratio:  1.64        Systemic VTI:  0.17 m                             Systemic Diam: 1.80 cm Debbe OdeaBrian Agbor-Etang MD Electronically signed by Debbe OdeaBrian Agbor-Etang MD Signature Date/Time: 10/11/2020/11:57:08 AM    Final         Scheduled Meds:  carvedilol  6.25 mg Oral BID WC   enoxaparin (LOVENOX) injection  0.5 mg/kg Subcutaneous Q24H   furosemide  80 mg Intravenous BID   insulin aspart  0-20 Units Subcutaneous TID WC   insulin aspart  0-5 Units Subcutaneous QHS   lisinopril  5 mg Oral Daily   metolazone  5 mg Oral Daily   sodium chloride  flush  3 mL Intravenous Q12H   Continuous Infusions:  sodium chloride      Assessment & Plan:   Principal Problem:   Acute on chronic combined systolic and diastolic heart failure (HCC) Active Problems:   NICM (nonischemic cardiomyopathy) (HCC)   Essential hypertension   Chronic anemia   Anasarca   Hyperglycemia due to type 2 diabetes mellitus (HCC)   35 year old female with DM, HTN, chronic combined heart failure last EF 20 to 25% in January 2022, w presenting with fluid retention for the past month now associated with shortness of breath with exertion and orthopnea.     Acute on chronic combined systolic and diastolic CHF   NICM (nonischemic cardiomyopathy) (HCC)   Anasarca    Pericardial effusion on chest x-ray - Patient with generalized edema, dyspnea on exertion and orthopnea since reduction in torsemide dose a month prior - BNP was elevated 6/12- cardiology following Echo EF 30-35% which has improved from 1/22 echo Continue Lasix 80 mg IV twice daily Continue lisinopril, Coreg and metolazone I's and O's, daily weight Monitor renal function       Hyperglycemia due to type 2 diabetes mellitus (HCC) BG stable Continue R-ISS     Essential hypertension Continue lisinopril and Coreg     Chronic anemia Stable.  H&H near baseline  Hypokalemia/hypomagnesemia We will replace magnesium with 2 g IV x1 Will supplement potassium Monitor levels     DVT prophylaxis: lovenox Code  Status:full Family Communication: none at bedside Disposition Plan:  Status is: Inpatient  Remains inpatient appropriate because:Inpatient level of care appropriate due to severity of illness  Dispo: The patient is from: Home              Anticipated d/c is to: Home              Patient currently is not medically stable to d/c.   Difficult to place patient No            LOS: 1 day   Time spent: 45 min with >50% on coc    Lynn Ito, MD [facility] Pager [phone]  If 7PM-7AM, please contact night-coverage 10/12/2020, 8:15 AM

## 2020-10-12 NOTE — Progress Notes (Signed)
Progress Note  Patient Name: Molly Howard Date of Encounter: 10/12/2020  [facility] Cardiologist: Debbe Odea, MD   Subjective   No acute events overnight, feels okay, shortness of breath slightly improved.  Inpatient Medications    Scheduled Meds:  carvedilol  6.25 mg Oral BID WC   [START ON 10/13/2020] enoxaparin (LOVENOX) injection  0.5 mg/kg Subcutaneous Q24H   furosemide  80 mg Intravenous BID   insulin aspart  0-20 Units Subcutaneous TID WC   insulin aspart  0-5 Units Subcutaneous QHS   lisinopril  5 mg Oral Daily   metolazone  5 mg Oral Daily   potassium chloride  40 mEq Oral Daily   sodium chloride flush  3 mL Intravenous Q12H   Continuous Infusions:  sodium chloride     PRN Meds: sodium chloride, acetaminophen, ondansetron (ZOFRAN) IV, sodium chloride flush   Vital Signs    Vitals:   10/12/20 0640 10/12/20 0804 10/12/20 1113 10/12/20 1207  BP:  114/80 114/78 113/78  Pulse:  88 89 92  Resp:  18 20 20   Temp:  98.2 F (36.8 C) 98.3 F (36.8 C) 98.3 F (36.8 C)  TempSrc:  Oral Oral Oral  SpO2:  94% 91% 93%  Weight: 135.6 kg     Height:        Intake/Output Summary (Last 24 hours) at 10/12/2020 1321 Last data filed at 10/12/2020 1117 Gross per 24 hour  Intake 460 ml  Output 2100 ml  Net -1640 ml   Last 3 Weights 10/12/2020 10/10/2020 10/10/2020  Weight (lbs) 299 lb 230 lb 237 lb  Weight (kg) 135.626 kg 104.327 kg 107.502 kg      Telemetry    Sinus rhythm- Personally Reviewed  ECG    No new ECG obtained- Personally Reviewed  Physical Exam   GEN: No acute distress.   Neck: No JVD Cardiac: RRR, no murmurs, rubs, or gallops.  Respiratory: Diminished breath sounds at bases GI: Soft, nontender, distended  MS: 2+ edema; No deformity. Neuro:  Nonfocal  Psych: Normal affect   Labs    High Sensitivity Troponin:   Recent Labs  Lab 10/10/20 1312 10/10/20 2252  TROPONINIHS 14 15      Chemistry Recent Labs  Lab 10/10/20 1312  10/11/20 0417 10/12/20 0518  NA 134* 133* 135  K 4.2 3.6 3.3*  CL 92* 92* 92*  CO2 32 32 36*  GLUCOSE 367* 332* 117*  BUN 20 19 19   CREATININE 1.05* 0.99 0.94  CALCIUM 7.3* 7.4* 7.3*  GFRNONAA >60 >60 >60  ANIONGAP 10 9 7      Hematology Recent Labs  Lab 10/10/20 1312  WBC 5.1  RBC 3.77*  HGB 9.6*  HCT 31.9*  MCV 84.6  MCH 25.5*  MCHC 30.1  RDW 17.1*  PLT 407*    BNP Recent Labs  Lab 10/10/20 1312  BNP 841.3*     DDimer No results for input(s): DDIMER in the last 168 hours.   Radiology    DG Chest 2 View  Result Date: 10/10/2020 CLINICAL DATA:  Shortness of breath.  Fluid retention. EXAM: CHEST - 2 VIEW COMPARISON:  Chest x-ray dated May 19, 2020. FINDINGS: Cardiopericardial silhouette appears enlarged compared to the prior study. Continued pulmonary vascular congestion and interstitial edema. Right lower lobe atelectasis. No focal consolidation, pleural effusion, or pneumothorax. No acute osseous abnormality. IMPRESSION: 1. Enlarging cardiopericardial silhouette concerning for underlying pericardial effusion. 2. Continued pulmonary vascular congestion and interstitial edema. Electronically Signed   By:  Obie Dredge M.D.   On: 10/10/2020 14:26   ECHOCARDIOGRAM COMPLETE  Result Date: 10/11/2020    ECHOCARDIOGRAM REPORT   Patient Name:   Molly Howard Date of Exam: 10/11/2020 Medical Rec #:  [id number]     Height:       67.0 in Accession #:    [id number]    Weight:       230.0 lb Date of Birth:  Mar 28, 1986     BSA:          2.146 m Patient Age:    35 years      BP:           133/94 mmHg Patient Gender: F             HR:           93 bpm. Exam Location:  [facility] Procedure: 2D Echo, 3D Echo and Strain Analysis Indications:     pERICARDIAL EFFUSION I31.3  History:         Patient has prior history of Echocardiogram examinations, most                  recent 05/20/2020.  Sonographer:     Overton Mam Referring Phys:  [id number] Andris Baumann Diagnosing Phys: Debbe Odea MD  Sonographer Comments: Global longitudinal strain was attempted. IMPRESSIONS  1. Left ventricular ejection fraction, by estimation, is 30 to 35%. The left ventricle has moderate to severely decreased function. The left ventricle demonstrates global hypokinesis. Left ventricular diastolic parameters are consistent with Grade II diastolic dysfunction (pseudonormalization).  2. Right ventricular systolic function is mildly reduced. The right ventricular size is normal.  3. A small pericardial effusion is present.  4. The mitral valve is normal in structure. No evidence of mitral valve regurgitation.  5. Tricuspid valve regurgitation is moderate.  6. The aortic valve is tricuspid. Aortic valve regurgitation is not visualized. FINDINGS  Left Ventricle: Left ventricular ejection fraction, by estimation, is 30 to 35%. The left ventricle has moderate to severely decreased function. The left ventricle demonstrates global hypokinesis. Global longitudinal strain performed but not reported based on interpreter judgement due to suboptimal tracking. 3D left ventricular ejection fraction analysis performed but not reported based on interpreter judgement due to suboptimal quality. The left ventricular internal cavity size was normal in size. There is no left ventricular hypertrophy. Left ventricular diastolic parameters are consistent with Grade II diastolic dysfunction (pseudonormalization). Right Ventricle: The right ventricular size is normal. No increase in right ventricular wall thickness. Right ventricular systolic function is mildly reduced. Left Atrium: Left atrial size was normal in size. Right Atrium: Right atrial size was normal in size. Pericardium: A small pericardial effusion is present. Mitral Valve: The mitral valve is normal in structure. No evidence of mitral valve regurgitation. Tricuspid Valve: The tricuspid valve is normal in structure. Tricuspid valve regurgitation is moderate. Aortic Valve: The  aortic valve is tricuspid. Aortic valve regurgitation is not visualized. Aortic valve peak gradient measures 11.8 mmHg. Pulmonic Valve: The pulmonic valve was normal in structure. Pulmonic valve regurgitation is mild. Aorta: The aortic root is normal in size and structure. Venous: The inferior vena cava was not well visualized. IAS/Shunts: No atrial level shunt detected by color flow Doppler.  LEFT VENTRICLE PLAX 2D LVIDd:         5.24 cm      Diastology LVIDs:         4.54 cm      LV e' medial:  5.77 cm/s LV PW:         0.98 cm      LV E/e' medial:  16.0 LV IVS:        0.96 cm      LV e' lateral:   9.36 cm/s LVOT diam:     1.80 cm      LV E/e' lateral: 9.9 LV SV:         43 LV SV Index:   20 LVOT Area:     2.54 cm                              3D Volume EF: LV Volumes (MOD)            3D EF:        41 % LV vol d, MOD A2C: 127.0 ml LV EDV:       202 ml LV vol d, MOD A4C: 160.0 ml LV ESV:       120 ml LV vol s, MOD A2C: 103.0 ml LV SV:        82 ml LV vol s, MOD A4C: 94.3 ml LV SV MOD A2C:     24.0 ml LV SV MOD A4C:     160.0 ml LV SV MOD BP:      44.3 ml RIGHT VENTRICLE RV Basal diam:  3.81 cm RV S prime:     14.00 cm/s TAPSE (M-mode): 2.0 cm LEFT ATRIUM             Index       RIGHT ATRIUM           Index LA diam:        3.00 cm 1.40 cm/m  RA Area:     14.80 cm LA Vol (A2C):   53.1 ml 24.74 ml/m RA Volume:   37.40 ml  17.43 ml/m LA Vol (A4C):   23.3 ml 10.86 ml/m LA Biplane Vol: 35.7 ml 16.64 ml/m  AORTIC VALVE                PULMONIC VALVE AV Area (Vmax): 1.44 cm    PV Vmax:       0.96 m/s AV Vmax:        172.00 cm/s PV Peak grad:  3.7 mmHg AV Peak Grad:   11.8 mmHg LVOT Vmax:      97.50 cm/s LVOT Vmean:     63.000 cm/s LVOT VTI:       0.170 m  AORTA Ao Root diam: 2.60 cm MITRAL VALVE               TRICUSPID VALVE MV Area (PHT): 5.38 cm    TV Peak grad:   26.7 mmHg MV Decel Time: 141 msec    TV Vmax:        2.58 m/s MV E velocity: 92.40 cm/s MV A velocity: 56.30 cm/s  SHUNTS MV E/A ratio:  1.64         Systemic VTI:  0.17 m                            Systemic Diam: 1.80 cm Debbe Odea MD Electronically signed by Debbe Odea MD Signature Date/Time: 10/11/2020/11:57:08 AM    Final     Cardiac Studies   TTE 10/11/2020 1. Left ventricular ejection fraction, by estimation, is 30 to 35%. The  left ventricle has moderate  to severely decreased function. The left  ventricle demonstrates global hypokinesis. Left ventricular diastolic  parameters are consistent with Grade II  diastolic dysfunction (pseudonormalization).   2. Right ventricular systolic function is mildly reduced. The right  ventricular size is normal.   3. A small pericardial effusion is present.   4. The mitral valve is normal in structure. No evidence of mitral valve  regurgitation.   5. Tricuspid valve regurgitation is moderate.   6. The aortic valve is tricuspid. Aortic valve regurgitation is not  visualized.   Patient Profile     35 y.o. female with history of hypertension, NICM EF 30-35%, former polysubstance use presenting with shortness of breath and edema, being seen for CHF  Assessment & Plan    NICM EF 25-230% -Still volume overloaded -Net -2 L over the past 24 hours, creatinine normal -Continue metolazone, Lasix 80 mg twice daily -Replete magnesium, monitor potassium -Continue Coreg, lisinopril.  2.  Hypertension -BP controlled -Continue Coreg and lisinopril  Total encounter time  Greater than 50% was spent in counseling and coordination of care with the patient        Signed, Debbe Odea, MD  10/12/2020, 1:21 PM

## 2020-10-12 NOTE — Progress Notes (Signed)
PHARMACIST - PHYSICIAN COMMUNICATION  CONCERNING:  Enoxaparin (Lovenox) for DVT Prophylaxis    RECOMMENDATION: Patient was prescribed enoxaparin 52.5 mg q24 hours for VTE prophylaxis.   Filed Weights   10/10/20 1253 10/10/20 1311 10/12/20 0640  Weight: 107.5 kg (237 lb) 104.3 kg (230 lb) 135.6 kg (299 lb)    Body mass index is 46.83 kg/m.  Estimated Creatinine Clearance: 119.1 mL/min (by C-G formula based on SCr of 0.94 mg/dL).   Based on [facility] policy patient is candidate for enoxaparin 0.5mg /kg TBW SQ every 24 hours based on BMI being >30.  DESCRIPTION: Pharmacy has adjusted enoxaparin dose per [facility] policy.  Patient is now receiving enoxaparin 67.5 mg every 24 hours    Tressie Ellis 10/12/2020 12:00 PM

## 2020-10-12 NOTE — Evaluation (Signed)
Physical Therapy Evaluation Patient Details Name: Molly Howard MRN: [id number] DOB: 1985-09-26 Today's Date: 10/12/2020   History of Present Illness  35 y.o. 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  She states that a month ago her doctor cut her torsemide dose in half due to concern for her kidney function and since then she started retaining fluid.  Clinical Impression  Pt eager to work with PT but very functionally limited.  She reports that until a few weeks ago she was independent w/o AD, however with LE swelling she has been able to tolerate less and less and has needed a SPC.  She did need considerable assist with bed mobility (to and from supine) and and could not rise from standard height bed, needing heavy assist.  Pt struggled with ambulation taking small, shuffling steps and very quick to fatigue.  Pt is very far from her baseline and per today's performance would not be safe to manage at home, expect that as swelling goes down she will show increased function but until such time PT can only recommend STR, regardless of disposition venue she will very likely need RW.      Follow Up Recommendations SNF (expect improvement with improved swelling, hoping to go home)    Equipment Recommendations  Rolling walker with 5" wheels    Recommendations for Other Services       Precautions / Restrictions Precautions Precautions: Fall Restrictions Weight Bearing Restrictions: No      Mobility  Bed Mobility Overal bed mobility: Needs Assistance Bed Mobility: Supine to Sit;Sit to Supine     Supine to sit: Mod assist Sit to supine: Mod assist   General bed mobility comments: Pt makes good effort to get up w/o assist, however she was unable to even with  minimal assist, ultimately PT had to heavily assist with LEs and trunk getting both to and back down from sitting    Transfers Overall transfer level: Needs assistance Equipment used: Rolling walker (2 wheeled) Transfers: Sit to/from Stand Sit to Stand: Min assist;From elevated surface         General transfer comment: unable to rise on her own from standard height bed, tried again with bed raised and ultimately needed direct assist to get to standing, unable to get LEs together enough to stay inside walker  Ambulation/Gait Ambulation/Gait assistance: Min assist Gait Distance (Feet): 20 Feet Assistive device: Rolling walker (2 wheeled)       General Gait Details: extremely slow and labored effort, pt clearly with LE pain and inability to move them in any sort of consistent/fluid way.  Her O2 and HR remained stable and appropriate but she was very quick to fatigue and could not tolerate much activity at all.  Stairs            Wheelchair Mobility    Modified Rankin (Stroke Patients Only)       Balance Overall balance assessment: Needs assistance Sitting-balance support: Single extremity supported Sitting balance-Leahy Scale: Good Sitting balance - Comments: pt able to maintain sitting at EOB, but needed heavy assist to get to sitting and could not comfortably get LEs to the floor secondary to swelling   Standing balance support: Bilateral upper extremity supported Standing balance-Leahy Scale: Poor Standing balance comment: very reliant on  walker, poor tolerance, unable to get LEs together 2/2 swelling.  No overt LOBs but far from independent baseline                             Pertinent Vitals/Pain Pain Assessment: 0-10 Pain Score: 4  Pain Location: b/l LEs tight and swollen    Home Living Family/patient expects to be discharged to:: Private residence Living Arrangements: Parent;Other relatives Available Help at Discharge: Family;Available 24  hours/day   Home Access: Stairs to enter Entrance Stairs-Rails: None (does have a post to pull up from) Entrance Stairs-Number of Steps: 3 Home Layout: One level Home Equipment: Cane - single point      Prior Function Level of Independence: Independent         Comments: pt has recently needed to use a cane and has struggled to even go room to room secondary to LE swelling     Hand Dominance        Extremity/Trunk Assessment   Upper Extremity Assessment Upper Extremity Assessment: Generalized weakness;Overall WFL for tasks assessed    Lower Extremity Assessment Lower Extremity Assessment: Generalized weakness (b/l LEs very swollen with associated ROM and strength limitations)       Communication   Communication: No difficulties  Cognition Arousal/Alertness: Awake/alert Behavior During Therapy: WFL for tasks assessed/performed Overall Cognitive Status: Within Functional Limits for tasks assessed                                        General Comments      Exercises     Assessment/Plan    PT Assessment Patient needs continued PT services  PT Problem List Decreased strength;Decreased range of motion;Decreased activity tolerance;Decreased balance;Decreased mobility;Decreased coordination;Decreased knowledge of use of DME;Decreased safety awareness;Pain;Cardiopulmonary status limiting activity       PT Treatment Interventions DME instruction;Gait training;Stair training;Functional mobility training;Therapeutic activities;Therapeutic exercise;Balance training;Neuromuscular re-education;Patient/family education    PT Goals (Current goals can be found in the Care Plan section)  Acute Rehab PT Goals Patient Stated Goal: get swelling down and go home PT Goal Formulation: With patient Time For Goal Achievement: 10/26/20 Potential to Achieve Goals: Fair    Frequency Min 2X/week   Barriers to discharge        Co-evaluation                AM-PAC PT "6 Clicks" Mobility  Outcome Measure Help needed turning from your back to your side while in a flat bed without using bedrails?: A Lot Help needed moving from lying on your back to sitting on the side of a flat bed without using bedrails?: A Lot Help needed moving to and from a bed to a chair (including a wheelchair)?: A Lot Help needed standing up from a chair using your arms (e.g., wheelchair or bedside chair)?: A Little Help needed to walk in hospital room?: A Lot Help needed climbing 3-5 steps with a railing? : A Lot 6 Click Score: 13    End of Session Equipment Utilized During Treatment: Gait belt Activity Tolerance: Patient tolerated treatment well;Patient limited by pain;Patient limited by fatigue Patient left: with bed alarm set;with call bell/phone within reach Nurse Communication: Mobility status PT Visit Diagnosis: Muscle weakness (generalized) (M62.81);Difficulty in walking, not elsewhere classified (R26.2);Unsteadiness on feet (R26.81)    Time: 3235-5732 PT Time Calculation (min) (ACUTE ONLY): 25  min   Charges:   PT Evaluation $PT Eval Low Complexity: 1 Low PT Treatments $Gait Training: 8-22 mins        Malachi Pro, DPT 10/12/2020, 5:27 PM

## 2020-10-12 NOTE — Plan of Care (Signed)

## 2020-10-13 LAB — BASIC METABOLIC PANEL
Anion gap: 6 (ref 5–15)
BUN: 20 mg/dL (ref 6–20)
CO2: 36 mmol/L — ABNORMAL HIGH (ref 22–32)
Calcium: 7.5 mg/dL — ABNORMAL LOW (ref 8.9–10.3)
Chloride: 90 mmol/L — ABNORMAL LOW (ref 98–111)
Creatinine, Ser: 1.01 mg/dL — ABNORMAL HIGH (ref 0.44–1.00)
GFR, Estimated: >60 mL/min (ref >60)
Glucose, Bld: 196 mg/dL — ABNORMAL HIGH (ref 70–99)
Potassium: 3.7 mmol/L (ref 3.5–5.1)
Sodium: 132 mmol/L — ABNORMAL LOW (ref 135–145)

## 2020-10-13 LAB — GLUCOSE, CAPILLARY
Glucose-Capillary: 171 mg/dL — ABNORMAL HIGH (ref 70–99)
Glucose-Capillary: 175 mg/dL — ABNORMAL HIGH (ref 70–99)
Glucose-Capillary: 198 mg/dL — ABNORMAL HIGH (ref 70–99)

## 2020-10-13 LAB — HEMOGLOBIN A1C
Hgb A1c MFr Bld: 11.3 % — ABNORMAL HIGH (ref 4.8–5.6)
Mean Plasma Glucose: 278 mg/dL

## 2020-10-13 LAB — MAGNESIUM: Magnesium: 1.7 mg/dL (ref 1.7–2.4)

## 2020-10-13 MED ORDER — INSULIN ASPART 100 UNIT/ML IJ SOLN
4.0000 [IU] | Freq: Three times a day (TID) | INTRAMUSCULAR | Status: DC
  Administered 2020-10-13 – 2020-10-18 (×14): 4 [IU] via SUBCUTANEOUS
  Filled 2020-10-13 (×14): qty 1

## 2020-10-13 MED ORDER — MAGNESIUM OXIDE -MG SUPPLEMENT 400 (240 MG) MG PO TABS
400.0000 mg | ORAL_TABLET | Freq: Every day | ORAL | Status: DC
  Administered 2020-10-13 – 2020-10-18 (×6): 400 mg via ORAL
  Filled 2020-10-13 (×6): qty 1

## 2020-10-13 MED ORDER — LOSARTAN POTASSIUM 25 MG PO TABS
25.0000 mg | ORAL_TABLET | Freq: Every day | ORAL | Status: DC
  Administered 2020-10-14 – 2020-10-18 (×5): 25 mg via ORAL
  Filled 2020-10-13 (×5): qty 1

## 2020-10-13 MED ORDER — SPIRONOLACTONE 25 MG PO TABS
12.5000 mg | ORAL_TABLET | Freq: Every day | ORAL | Status: DC
  Administered 2020-10-13 – 2020-10-18 (×6): 12.5 mg via ORAL
  Filled 2020-10-13 (×2): qty 0.5
  Filled 2020-10-13: qty 1
  Filled 2020-10-13: qty 0.5
  Filled 2020-10-13 (×2): qty 1
  Filled 2020-10-13: qty 0.5
  Filled 2020-10-13: qty 1
  Filled 2020-10-13: qty 0.5
  Filled 2020-10-13: qty 1
  Filled 2020-10-13: qty 0.5

## 2020-10-13 NOTE — Progress Notes (Signed)
Inpatient Diabetes Program Recommendations  AACE/ADA: New Consensus Statement on Inpatient Glycemic Control   Target Ranges:  Prepandial:   less than 140 mg/dL      Peak postprandial:   less than 180 mg/dL (1-2 hours)      Critically ill patients:  140 - 180 mg/dL    Results for Molly Howard, Molly Howard (MRN [id number]) as of 10/13/2020 10:55  Ref. Range 10/12/2020 08:05 10/12/2020 11:15 10/12/2020 17:56 10/12/2020 19:39 10/13/2020 08:31  Glucose-Capillary Latest Ref Range: 70 - 99 mg/dL 419 (H) 622 (H) 297 (H) 236 (H) 171 (H)  Results for Molly Howard, Molly Howard (MRN [id number]) as of 10/13/2020 10:55  Ref. Range 05/20/2020 07:57 10/11/2020 04:09  Hemoglobin A1C Latest Ref Range: 4.8 - 5.6 % 11.8 (H) 11.3 (H)   Review of Glycemic Control  Diabetes history: DM2 Outpatient Diabetes medications: 70/30 10 units BID Current orders for Inpatient glycemic control: Novolog 0-20 units TID with meals, Novolog 0-5 units QHS  Inpatient Diabetes Program Recommendations:    Insulin: Please consider ordering Novolog 4 units TID with meals for meal coverage if patient eats at least 50% of meals.  HbgA1C:  A1C 11.3% on 10/11/20 indicating an average glucose of 278 mg/dl over the past 2-3 months.  NOTE: Per chart, patient has no insurance and per progress note by Milagros Reap (Care Manager with [facility]) on 09/26/20, "Patient still needs to provide 2022 Disability Award Letter from [employer] and 2021 [employer] Tax Return.  No additional medication assistance will be provided until patient provides [facility] with this requested financial documentation." Spoke with patient over the phone to inquire about DM meds and control. Patient states that she is prescribed 70/30 10 units BID (vial/syringe) but she notes that she has been out of insulin for at least 1 month. Patient reports that she has applied for disability but has not heard back from them yet and her mother is supporting her financially right now. Patient  confirms that she can not get medications from [facility] until she provides them with paperwork. Informed patient that per note on 09/26/20 she needs to provide them with a 202 Disability award letter and 2021 tax returns. Patient states she does not have anything from social security regarding disability but she does her 2021 tax return that she could provide. Patient states that she has been using her mother's glucometer for glucose monitoring and notes that since she has been out of insulin her CBGs have been 200's mg/dl. Discussed A1C of 11.3% indicating an average glucose of 278 mg/dl. Discussed glucose and A1C goals.  Discussed importance of checking CBGs and maintaining good CBG control to prevent long-term and short-term complications. Explained how hyperglycemia leads to damage within blood vessels which lead to the common complications seen with uncontrolled diabetes. Stressed to the patient the importance of improving glycemic control to prevent further complications from uncontrolled diabetes. Informed patient that I would reach out to [facility] to see if they can provide her with a glucometer and testing supplies and to follow up with [facility] to see if a 30 day supply of medications could be provided so patient can have time to provide needed documents to [facility]. Discussed Novolin 70/30 at [facility] which is $25 per vial and patient states she is not sure she could afford or not as her mother is supporting her financially right now.  Encouraged patient to call or go to [facility] to provide needed documents so they can  continue to assist with prescribed medications. Patient verbalized understanding of information discussed and reports no further questions at this time related to diabetes.  Thanks, Orlando Penner, RN, MSN, CDE Diabetes Coordinator Inpatient Diabetes Program [phone] (Team Pager)

## 2020-10-13 NOTE — Progress Notes (Signed)
PROGRESS NOTE    Molly Howard  PVX:[id number] DOB: 02-21-1986 DOA: 10/10/2020 PCP: Patient, No Pcp Per (Inactive)    Brief Narrative:  Molly Howard is a 35 y.o. 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  She stated that a month ago her doctor cut her torsemide dose in half due to concern for her kidney function and since then she started retaining fluid. Admitted for CHF  6/13- feels little less sob, but not close to baseline. Still unable to ambulate with LE edema. Creatinine 1.01. tox +cocaine again     Consultants:  cardiology  Procedures:   Antimicrobials:      Subjective: Good UO. No cp  Objective: Vitals:   10/12/20 1207 10/12/20 1522 10/12/20 1937 10/13/20 0447  BP: 113/78 108/81 113/79 124/76  Pulse: 92 88 91 88  Resp: 20 18 14 16   Temp: 98.3 F (36.8 C) 98.2 F (36.8 C) 97.9 F (36.6 C) 98.2 F (36.8 C)  TempSrc: Oral  Oral   SpO2: 93% 95% 96% 97%  Weight:    (!) 136.4 kg  Height:        Intake/Output Summary (Last 24 hours) at 10/13/2020 0836 Last data filed at 10/13/2020 0500 Gross per 24 hour  Intake 530 ml  Output 2150 ml  Net -1620 ml   Filed Weights   10/10/20 1311 10/12/20 0640 10/13/20 0447  Weight: 104.3 kg 135.6 kg (!) 136.4 kg    Examination: Calm, nad Decrease bs at bases, no w/r Regular s1/s2 no gallop Soft distended, +bs 3+ pitting edema up to mid thigh Awake and oriented x3, grossly intact  Mood and affect appropriate in current setting    Data Reviewed: I have personally reviewed following labs and imaging studies  CBC: Recent Labs  Lab 10/10/20 1312  WBC 5.1  HGB 9.6*  HCT 31.9*  MCV 84.6  PLT 407*   Basic Metabolic Panel: Recent Labs  Lab 10/10/20 1312 10/11/20 0417  10/12/20 0518 10/13/20 0551  NA 134* 133* 135 132*  K 4.2 3.6 3.3* 3.7  CL 92* 92* 92* 90*  CO2 32 32 36* 36*  GLUCOSE 367* 332* 117* 196*  BUN 20 19 19 20   CREATININE 1.05* 0.99 0.94 1.01*  CALCIUM 7.3* 7.4* 7.3* 7.5*  MG  --   --  1.6*  --    GFR: Estimated Creatinine Clearance: 111.2 mL/min (A) (by C-G formula based on SCr of 1.01 mg/dL (H)). Liver Function Tests: No results for input(s): AST, ALT, ALKPHOS, BILITOT, PROT, ALBUMIN in the last 168 hours. No results for input(s): LIPASE, AMYLASE in the last 168 hours. No results for input(s): AMMONIA in the last 168 hours. Coagulation Profile: No results for input(s): INR, PROTIME in the last 168 hours. Cardiac Enzymes: No results for input(s): CKTOTAL, CKMB, CKMBINDEX, TROPONINI in the last 168 hours. BNP (last 3 results) No results for input(s): PROBNP in the last 8760 hours. HbA1C: No results for input(s): HGBA1C in the last 72 hours. CBG: Recent Labs  Lab 10/12/20 0805 10/12/20 1115 10/12/20 1756 10/12/20 1939 10/13/20 0831  GLUCAP 142* 176* 203* 236* 171*   Lipid Profile: No results for input(s): CHOL, HDL, LDLCALC, TRIG, CHOLHDL, LDLDIRECT in the last 72 hours. Thyroid Function Tests: No results for input(s): TSH, T4TOTAL, FREET4,  T3FREE, THYROIDAB in the last 72 hours. Anemia Panel: No results for input(s): VITAMINB12, FOLATE, FERRITIN, TIBC, IRON, RETICCTPCT in the last 72 hours. Sepsis Labs: No results for input(s): PROCALCITON, LATICACIDVEN in the last 168 hours.  Recent Results (from the past 240 hour(s))  Resp Panel by RT-PCR (Flu A&B, Covid) Nasopharyngeal Swab     Status: None   Collection Time: 10/11/20 12:29 AM   Specimen: Nasopharyngeal Swab; Nasopharyngeal(NP) swabs in vial transport medium  Result Value Ref Range Status   SARS Coronavirus 2 by RT PCR NEGATIVE NEGATIVE Final    Comment: (NOTE) SARS-CoV-2 target nucleic acids are NOT DETECTED.  The SARS-CoV-2 RNA is generally detectable in upper  respiratory specimens during the acute phase of infection. The lowest concentration of SARS-CoV-2 viral copies this assay can detect is 138 copies/mL. A negative result does not preclude SARS-Cov-2 infection and should not be used as the sole basis for treatment or other patient management decisions. A negative result may occur with  improper specimen collection/handling, submission of specimen other than nasopharyngeal swab, presence of viral mutation(s) within the areas targeted by this assay, and inadequate number of viral copies(<138 copies/mL). A negative result must be combined with clinical observations, patient history, and epidemiological information. The expected result is Negative.  Fact Sheet for Patients:  BloggerCourse.comhttps://www.fda.gov/media/152166/download  Fact Sheet for Healthcare Providers:  SeriousBroker.ithttps://www.fda.gov/media/152162/download  This test is no t yet approved or cleared by the [place] FDA and  has been authorized for detection and/or diagnosis of SARS-CoV-2 by FDA under an Emergency Use Authorization (EUA). This EUA will remain  in effect (meaning this test can be used) for the duration of the COVID-19 declaration under Section 564(b)(1) of the Act, 21 U.S.C.section 360bbb-3(b)(1), unless the authorization is terminated  or revoked sooner.       Influenza A by PCR NEGATIVE NEGATIVE Final   Influenza B by PCR NEGATIVE NEGATIVE Final    Comment: (NOTE) The Xpert Xpress SARS-CoV-2/FLU/RSV plus assay is intended as an aid in the diagnosis of influenza from Nasopharyngeal swab specimens and should not be used as a sole basis for treatment. Nasal washings and aspirates are unacceptable for Xpert Xpress SARS-CoV-2/FLU/RSV testing.  Fact Sheet for Patients: BloggerCourse.comhttps://www.fda.gov/media/152166/download  Fact Sheet for Healthcare Providers: SeriousBroker.ithttps://www.fda.gov/media/152162/download  This test is not yet approved or cleared by the [place] FDA and has been  authorized for detection and/or diagnosis of SARS-CoV-2 by FDA under an Emergency Use Authorization (EUA). This EUA will remain in effect (meaning this test can be used) for the duration of the COVID-19 declaration under Section 564(b)(1) of the Act, 21 U.S.C. section 360bbb-3(b)(1), unless the authorization is terminated or revoked.  Performed at [facility], [address] [place], [place] [id number]          Radiology Studies: ECHOCARDIOGRAM COMPLETE  Result Date: 10/11/2020    ECHOCARDIOGRAM REPORT   Patient Name:   Delford FieldSHIA L Stutsman Date of Exam: 10/11/2020 Medical Rec #:  [id number]     Height:       67.0 in Accession #:    [id number]    Weight:       230.0 lb Date of Birth:  12/17/1985     BSA:          2.146 m Patient Age:    35 years      BP:           133/94 mmHg Patient Gender: F  HR:           93 bpm. Exam Location:  [facility] Procedure: 2D Echo, 3D Echo and Strain Analysis Indications:     pERICARDIAL EFFUSION I31.3  History:         Patient has prior history of Echocardiogram examinations, most                  recent 05/20/2020.  Sonographer:     Overton Mam Referring Phys:  [id number] Andris Baumann Diagnosing Phys: Debbe Odea MD  Sonographer Comments: Global longitudinal strain was attempted. IMPRESSIONS  1. Left ventricular ejection fraction, by estimation, is 30 to 35%. The left ventricle has moderate to severely decreased function. The left ventricle demonstrates global hypokinesis. Left ventricular diastolic parameters are consistent with Grade II diastolic dysfunction (pseudonormalization).  2. Right ventricular systolic function is mildly reduced. The right ventricular size is normal.  3. A small pericardial effusion is present.  4. The mitral valve is normal in structure. No evidence of mitral valve regurgitation.  5. Tricuspid valve regurgitation is moderate.  6. The aortic valve is tricuspid. Aortic valve regurgitation is not visualized. FINDINGS  Left  Ventricle: Left ventricular ejection fraction, by estimation, is 30 to 35%. The left ventricle has moderate to severely decreased function. The left ventricle demonstrates global hypokinesis. Global longitudinal strain performed but not reported based on interpreter judgement due to suboptimal tracking. 3D left ventricular ejection fraction analysis performed but not reported based on interpreter judgement due to suboptimal quality. The left ventricular internal cavity size was normal in size. There is no left ventricular hypertrophy. Left ventricular diastolic parameters are consistent with Grade II diastolic dysfunction (pseudonormalization). Right Ventricle: The right ventricular size is normal. No increase in right ventricular wall thickness. Right ventricular systolic function is mildly reduced. Left Atrium: Left atrial size was normal in size. Right Atrium: Right atrial size was normal in size. Pericardium: A small pericardial effusion is present. Mitral Valve: The mitral valve is normal in structure. No evidence of mitral valve regurgitation. Tricuspid Valve: The tricuspid valve is normal in structure. Tricuspid valve regurgitation is moderate. Aortic Valve: The aortic valve is tricuspid. Aortic valve regurgitation is not visualized. Aortic valve peak gradient measures 11.8 mmHg. Pulmonic Valve: The pulmonic valve was normal in structure. Pulmonic valve regurgitation is mild. Aorta: The aortic root is normal in size and structure. Venous: The inferior vena cava was not well visualized. IAS/Shunts: No atrial level shunt detected by color flow Doppler.  LEFT VENTRICLE PLAX 2D LVIDd:         5.24 cm      Diastology LVIDs:         4.54 cm      LV e' medial:    5.77 cm/s LV PW:         0.98 cm      LV E/e' medial:  16.0 LV IVS:        0.96 cm      LV e' lateral:   9.36 cm/s LVOT diam:     1.80 cm      LV E/e' lateral: 9.9 LV SV:         43 LV SV Index:   20 LVOT Area:     2.54 cm                              3D  Volume EF: LV Volumes (MOD)  3D EF:        41 % LV vol d, MOD A2C: 127.0 ml LV EDV:       202 ml LV vol d, MOD A4C: 160.0 ml LV ESV:       120 ml LV vol s, MOD A2C: 103.0 ml LV SV:        82 ml LV vol s, MOD A4C: 94.3 ml LV SV MOD A2C:     24.0 ml LV SV MOD A4C:     160.0 ml LV SV MOD BP:      44.3 ml RIGHT VENTRICLE RV Basal diam:  3.81 cm RV S prime:     14.00 cm/s TAPSE (M-mode): 2.0 cm LEFT ATRIUM             Index       RIGHT ATRIUM           Index LA diam:        3.00 cm 1.40 cm/m  RA Area:     14.80 cm LA Vol (A2C):   53.1 ml 24.74 ml/m RA Volume:   37.40 ml  17.43 ml/m LA Vol (A4C):   23.3 ml 10.86 ml/m LA Biplane Vol: 35.7 ml 16.64 ml/m  AORTIC VALVE                PULMONIC VALVE AV Area (Vmax): 1.44 cm    PV Vmax:       0.96 m/s AV Vmax:        172.00 cm/s PV Peak grad:  3.7 mmHg AV Peak Grad:   11.8 mmHg LVOT Vmax:      97.50 cm/s LVOT Vmean:     63.000 cm/s LVOT VTI:       0.170 m  AORTA Ao Root diam: 2.60 cm MITRAL VALVE               TRICUSPID VALVE MV Area (PHT): 5.38 cm    TV Peak grad:   26.7 mmHg MV Decel Time: 141 msec    TV Vmax:        2.58 m/s MV E velocity: 92.40 cm/s MV A velocity: 56.30 cm/s  SHUNTS MV E/A ratio:  1.64        Systemic VTI:  0.17 m                            Systemic Diam: 1.80 cm Debbe Odea MD Electronically signed by Debbe Odea MD Signature Date/Time: 10/11/2020/11:57:08 AM    Final         Scheduled Meds:  carvedilol  6.25 mg Oral BID WC   enoxaparin (LOVENOX) injection  0.5 mg/kg Subcutaneous Q24H   furosemide  80 mg Intravenous BID   insulin aspart  0-20 Units Subcutaneous TID WC   insulin aspart  0-5 Units Subcutaneous QHS   lisinopril  5 mg Oral Daily   metolazone  5 mg Oral Daily   potassium chloride  40 mEq Oral Daily   sodium chloride flush  3 mL Intravenous Q12H   Continuous Infusions:  sodium chloride      Assessment & Plan:   Principal Problem:   Acute on chronic combined systolic and diastolic heart failure  (HCC) Active Problems:   NICM (nonischemic cardiomyopathy) (HCC)   Essential hypertension   Chronic anemia   Anasarca   Hyperglycemia due to type 2 diabetes mellitus (HCC)   35 year old female with DM, HTN, chronic combined heart failure last  EF 20 to 25% in January 2022, w presenting with fluid retention for the past month now associated with shortness of breath with exertion and orthopnea.     Acute on chronic combined systolic and diastolic CHF   NICM (nonischemic cardiomyopathy) (HCC)   Anasarca    Pericardial effusion on chest x-ray - Patient with generalized edema, dyspnea on exertion and orthopnea since reduction in torsemide dose a month prior - BNP was elevated ardiology following Echo EF 30-35% which has improved from 1/22 echo 6/13 continue Lasix and metolazone  Cardiology will add carvedilol  Car switching lisinopril to losartan to facilitate the transition to Entresto if BP allows Low-dose spironolactone added due to significant hypokalemia Cause we will consider Farxiga as outpatient I/o , daily wt       Hyperglycemia due to type 2 diabetes mellitus (HCC) BG mildly elevated A1c 11.3 Diabetic educator llowing Has been out of insulin for at least 1 month  Will add novolog 4 units with meals   Cocaine abuse- positive again on toxicology Patient was extensively counseled on complications of cocaine abuse including death and she verbalizes an understanding.  Encouraged to follow-up with outpatient rehab so she can quit      Essential hypertension Stable continue Lasix, losartan, Coreg     Chronic anemia Stable, monitor H&H periodically   Hypokalemia/hypomagnesemia Replacing, improving     DVT prophylaxis: lovenox Code Status:full Family Communication: none at bedside Disposition Plan:  Status is: Inpatient  Remains inpatient appropriate because:Inpatient level of care appropriate due to severity of illness  Dispo: The patient is from: Home               Anticipated d/c is to: Home              Patient currently is not medically stable to d/c.   Difficult to place patient No            LOS: 2 days   Time spent: 45 min with >50% on coc    Lynn Ito, MD [facility] Pager [phone]  If 7PM-7AM, please contact night-coverage 10/13/2020, 8:36 AM

## 2020-10-13 NOTE — Plan of Care (Signed)
  Problem: Health Behavior/Discharge Planning: Goal: Ability to manage health-related needs will improve Outcome: Progressing   Problem: Clinical Measurements: Goal: Ability to maintain clinical measurements within normal limits will improve Outcome: Progressing Goal: Diagnostic test results will improve Outcome: Progressing   

## 2020-10-13 NOTE — Progress Notes (Signed)
Progress Note  Patient Name: Molly Howard Date of Encounter: 10/13/2020  Primary Cardiologist: Agbor-Etang  Subjective   Still SOB, feels like her dyspnea if "a little better." No chest pain or palpitations. Lower extremities remain swollen. Abdomen remains distended. Documented urine output 1.6 L for the past 24 hours with net -4 L for the admission.  Renal function overall stable.  Weight trend 135.6 to 136.4 kg.  Drug screen positive for cocaine.  Inpatient Medications    Scheduled Meds:  carvedilol  6.25 mg Oral BID WC   enoxaparin (LOVENOX) injection  0.5 mg/kg Subcutaneous Q24H   furosemide  80 mg Intravenous BID   insulin aspart  0-20 Units Subcutaneous TID WC   insulin aspart  0-5 Units Subcutaneous QHS   lisinopril  5 mg Oral Daily   metolazone  5 mg Oral Daily   potassium chloride  40 mEq Oral Daily   sodium chloride flush  3 mL Intravenous Q12H   Continuous Infusions:  sodium chloride     PRN Meds: sodium chloride, acetaminophen, ondansetron (ZOFRAN) IV, sodium chloride flush   Vital Signs    Vitals:   10/12/20 1937 10/13/20 0447 10/13/20 0909 10/13/20 1235  BP: 113/79 124/76 116/80 108/80  Pulse: 91 88 90 89  Resp: 14 16 18 18   Temp: 97.9 F (36.6 C) 98.2 F (36.8 C) 98.6 F (37 C) 98.5 F (36.9 C)  TempSrc: Oral  Oral Oral  SpO2: 96% 97% 95% 95%  Weight:  (!) 136.4 kg    Height:        Intake/Output Summary (Last 24 hours) at 10/13/2020 1237 Last data filed at 10/13/2020 1039 Gross per 24 hour  Intake 650 ml  Output 1950 ml  Net -1300 ml   Filed Weights   10/10/20 1311 10/12/20 0640 10/13/20 0447  Weight: 104.3 kg 135.6 kg (!) 136.4 kg    Telemetry    SR - Personally Reviewed  ECG    No new tracings - Personally Reviewed  Physical Exam   GEN: No acute distress.   Neck: JVD elevated to the angle of the mandible. Cardiac: RRR, no murmurs, rubs, or gallops.  Respiratory: Diminished breath sounds along the bilateral bases.  GI:  Soft, nontender, distended.   MS: 2+ bilateral lower extremity edema with chronic woody appearance; No deformity. Neuro:  Alert and oriented x 3; Nonfocal.  Psych: Normal affect.  Labs    Chemistry Recent Labs  Lab 10/11/20 0417 10/12/20 0518 10/13/20 0551  NA 133* 135 132*  K 3.6 3.3* 3.7  CL 92* 92* 90*  CO2 32 36* 36*  GLUCOSE 332* 117* 196*  BUN 19 19 20   CREATININE 0.99 0.94 1.01*  CALCIUM 7.4* 7.3* 7.5*  GFRNONAA >60 >60 >60  ANIONGAP 9 7 6      Hematology Recent Labs  Lab 10/10/20 1312  WBC 5.1  RBC 3.77*  HGB 9.6*  HCT 31.9*  MCV 84.6  MCH 25.5*  MCHC 30.1  RDW 17.1*  PLT 407*    Cardiac EnzymesNo results for input(s): TROPONINI in the last 168 hours. No results for input(s): TROPIPOC in the last 168 hours.   BNP Recent Labs  Lab 10/10/20 1312  BNP 841.3*     DDimer No results for input(s): DDIMER in the last 168 hours.   Radiology    No results found.  Cardiac Studies   2D echo 10/11/2020: 1. Left ventricular ejection fraction, by estimation, is 30 to 35%. The  left ventricle  has moderate to severely decreased function. The left  ventricle demonstrates global hypokinesis. Left ventricular diastolic  parameters are consistent with Grade II  diastolic dysfunction (pseudonormalization).   2. Right ventricular systolic function is mildly reduced. The right  ventricular size is normal.   3. A small pericardial effusion is present.   4. The mitral valve is normal in structure. No evidence of mitral valve  regurgitation.   5. Tricuspid valve regurgitation is moderate.   6. The aortic valve is tricuspid. Aortic valve regurgitation is not  visualized.  __________  2D echo 05/20/2020: 1. Left ventricular ejection fraction, by estimation, is 20 to 25%. The  left ventricle has severely decreased function. The left ventricle  demonstrates global hypokinesis. The left ventricular internal cavity size  was mildly dilated. Indeterminate  diastolic  filling due to E-A fusion.   2. Right ventricular systolic function is normal. The right ventricular  size is mildly enlarged. There is severely elevated pulmonary artery  systolic pressure.   3. The mitral valve is normal in structure. Trivial mitral valve  regurgitation. No evidence of mitral stenosis.   4. Tricuspid valve regurgitation is moderate.   5. The aortic valve was not well visualized. Aortic valve regurgitation  is not visualized. No aortic stenosis is present.   6. The inferior vena cava is dilated in size with <50% respiratory  variability, suggesting right atrial pressure of 15 mmHg. ____________  2D echo 10/03/2019:  1. Left ventricular ejection fraction, by estimation, is 35 to 40%. The  left ventricle has moderately decreased function. The left ventricle  demonstrates global hypokinesis. Left ventricular diastolic parameters are  consistent with Grade II diastolic  dysfunction (pseudonormalization).   2. Right ventricular systolic function is normal. The right ventricular  size is normal.   3. The mitral valve is normal in structure. No evidence of mitral valve  regurgitation. No evidence of mitral stenosis.   4. The aortic valve is normal in structure. Aortic valve regurgitation is  not visualized. No aortic stenosis is present.   5. The inferior vena cava is normal in size with greater than 50%  respiratory variability, suggesting right atrial pressure of 3 mmHg.    Patient Profile     35 y.o. female with history of HFrEF secondary to NICM, polysubstance abuse with ongoing cocaine use poorly controlled DM2, anemia, HLD, obesity, and GERD who we are evaluating for acute on chronic HFrEF.  Assessment & Plan    1.  Acute on chronic HFrEF secondary to NICM: -She remains significantly volume up -Net 4 L negative for the admission with renal function overall relatively stable -Continue IV Lasix and metolazone  -Continue GDMT including carvedilol and  lisinopril -Relative hypotension precludes escalation of GDMT including addition of Entresto in place of ACEi, MRA, and SGLT2i -Polysubstance abuse cessation is recommended, with ongoing substance abuse she is unlikely to be a candidate for advanced heart failure modalities -CHF education -Daily weights -Strict I's and O's  2.  HTN: -Blood pressure is well controlled -Continue current medical therapy as outlined above  3.  Polysubstance abuse: -Urine drug screen positive for cocaine this admission -Complete cessation recommended  4.  Hypokalemia: -Maintain potassium goal 4.0  5.  Hypomagnesemia: -Repeat level with recommendation to replete to goal 2.0 as indicated  6.  DM2: -Poorly controlled with A1c 11.3 this admission -Per IM  7.  Anemia: -Hgb low though stable   For questions or updates, please contact [facility] Please consult [email or web address] for contact  info under Cardiology/STEMI.    Signed, Eula Listen, PA-C [facility] Pager: [phone] 10/13/2020, 12:37 PM

## 2020-10-13 NOTE — TOC Initial Note (Signed)
Transition of Care [facility]) - Initial/Assessment Note    Patient Details  Name: Molly Howard MRN: [id number] Date of Birth: [birth year]  Transition of Care [facility]) CM/SW Contact:    Alberteen Sam, LCSW Phone Number: 10/13/2020, 2:13 PM  Clinical Narrative:                  CSW notes current SNF rec, however patient hopeful to progress to go home once medically more stable. CSW also notes patient currently has no insurance, can potentially get charity home health services at time of discharge.   CSW provided patient with glucometer kit at bedside.Will follow for dc medication needs as well at time of discharge, will assist via [facility] potentially filling meds at dc due to patient's lack of insurance.   Expected Discharge Plan: Home/Self Care Barriers to Discharge: Continued Medical Work up   Patient Goals and CMS Choice   CMS Medicare.gov Compare Post Acute Care list provided to:: Patient Choice offered to / list presented to : Patient  Expected Discharge Plan and Services Expected Discharge Plan: Home/Self Care                                              Prior Living Arrangements/Services                       Activities of Daily Living Home Assistive Devices/Equipment: None ADL Screening (condition at time of admission) Patient's cognitive ability adequate to safely complete daily activities?: Yes Is the patient deaf or have difficulty hearing?: No Does the patient have difficulty seeing, even when wearing glasses/contacts?: No Does the patient have difficulty concentrating, remembering, or making decisions?: No Patient able to express need for assistance with ADLs?: Yes Does the patient have difficulty dressing or bathing?: No Independently performs ADLs?: Yes (appropriate for developmental age) Does the patient have difficulty walking or climbing stairs?: Yes Weakness of Legs: Both Weakness of Arms/Hands: Both  Permission  Sought/Granted                  Emotional Assessment              Admission diagnosis:  Anasarca [R60.1] CHF (congestive heart failure) ([place]) [I50.9] Pericardial effusion [I31.3] Pulmonary vascular congestion [R09.89] Acute on chronic congestive heart failure, unspecified heart failure type ([place]) [I50.9] Patient Active Problem List   Diagnosis Date Noted   Anasarca 10/11/2020   Hyperglycemia due to type 2 diabetes mellitus ([place]) 10/11/2020   Dyspnea 05/21/2020   Elevated troponin 05/20/2020   Essential hypertension 05/20/2020   Diabetes mellitus, type II ([place]) 05/20/2020   Chronic anemia 05/20/2020   Thrombocytosis 05/20/2020   Obesity (BMI 30-39.9) 05/20/2020   Acute on chronic combined systolic and diastolic heart failure ([place]) 05/20/2020   CHF (congestive heart failure) ([place]) 05/19/2020   Chronic combined systolic and diastolic heart failure ([place]) 11/01/2019   Financial difficulties 11/01/2019   Medication management 11/01/2019   Lung nodule 11/01/2019   NICM (nonischemic cardiomyopathy) ([place]) 11/01/2019   Polysubstance abuse ([place]) 11/01/2019   Tobacco use 11/01/2019   HFrEF (heart failure with reduced ejection fraction) ([place])    Swelling    Symptomatic anemia 10/02/2019   PCP:  Patient, No Pcp Per (Inactive) Pharmacy:   [facility] [address], [place] [address] Phone:  [phone] Fax: [phone]     Social Determinants of Health (SDOH) Interventions    Readmission Risk Interventions No flowsheet data found.

## 2020-10-13 NOTE — Consult Note (Signed)
   Heart Failure Nurse Navigator Note  HFrEF 30-35%.  Grade 2 diastolic function.  Mildly reduced right ventricular systolic.  Moderate tricuspid regurgitation.   She presented to the emergency room with generalized swelling, shortness of breath on exertion and orthopnea.   Comorbidities:  Hypertension Diabetes Morbid obesity  Medications:  Coreg 6.25 mg twice a day furosemide 80 mg 2 times a day IV  lisinopril 5 mg daily Metolazone 5 mg daily  Labs:  Sodium 132, potassium 3.7, chloride 90, CO2 BUN 21, creatinine 1.01 Weight is 136.4 yesterday documented 135.6 kg Intake 530 mL Output 2150 ml   Initial meeting with patient today.  She states that she lives with her mother.  She does have a scale does not weigh herself on a daily basis.  Stressed the importance of daily weights and recording and reporting 2 pound weight gain overnight or 5 pounds weight gain within a week.  Discussed her fluid intake and she feels that she drinks more than 2 L daily.  Explained  the importance of sticking with less than 2 L daily and relationship to to her heart function.   She does  admit to having popcorn with salt and also likes to eat bacon but states she does not do it often.  Discussed lean meats fresh fruits and vegetables.  She admits to using cocaine, on the weekends, states no one else in her house uses.  Stressed abstaining.  Went over zone magnet.  Also given living with heart failure booklet.  Will continue to follow.  Tresa Endo RN CHFN

## 2020-10-14 DIAGNOSIS — F1410 Cocaine abuse, uncomplicated: Secondary | ICD-10-CM

## 2020-10-14 DIAGNOSIS — R601 Generalized edema: Secondary | ICD-10-CM

## 2020-10-14 DIAGNOSIS — D649 Anemia, unspecified: Secondary | ICD-10-CM

## 2020-10-14 LAB — BASIC METABOLIC PANEL
Anion gap: 10 (ref 5–15)
BUN: 21 mg/dL — ABNORMAL HIGH (ref 6–20)
CO2: 34 mmol/L — ABNORMAL HIGH (ref 22–32)
Calcium: 7.6 mg/dL — ABNORMAL LOW (ref 8.9–10.3)
Chloride: 88 mmol/L — ABNORMAL LOW (ref 98–111)
Creatinine, Ser: 1.02 mg/dL — ABNORMAL HIGH (ref 0.44–1.00)
GFR, Estimated: >60 mL/min (ref >60)
Glucose, Bld: 183 mg/dL — ABNORMAL HIGH (ref 70–99)
Potassium: 3.8 mmol/L (ref 3.5–5.1)
Sodium: 132 mmol/L — ABNORMAL LOW (ref 135–145)

## 2020-10-14 LAB — GLUCOSE, CAPILLARY
Glucose-Capillary: 182 mg/dL — ABNORMAL HIGH (ref 70–99)
Glucose-Capillary: 175 mg/dL — ABNORMAL HIGH (ref 70–99)
Glucose-Capillary: 218 mg/dL — ABNORMAL HIGH (ref 70–99)
Glucose-Capillary: 191 mg/dL — ABNORMAL HIGH (ref 70–99)
Glucose-Capillary: 196 mg/dL — ABNORMAL HIGH (ref 70–99)

## 2020-10-14 LAB — BRAIN NATRIURETIC PEPTIDE: B Natriuretic Peptide: 713 pg/mL — ABNORMAL HIGH (ref 0.0–100.0)

## 2020-10-14 MED ORDER — FUROSEMIDE 10 MG/ML IJ SOLN
80.0000 mg | Freq: Three times a day (TID) | INTRAMUSCULAR | Status: DC
  Administered 2020-10-14 – 2020-10-18 (×12): 80 mg via INTRAVENOUS
  Filled 2020-10-14 (×12): qty 8

## 2020-10-14 NOTE — Progress Notes (Signed)
Progress Note  Patient Name: Molly Howard Date of Encounter: 10/14/2020  Primary Cardiologist: Agbor-Etang  Subjective   Dyspnea about the same as yesterday. No chest pain or palpitations. Lower extremities remain swollen. Abdomen remains distended. Documented urine output 1.2 L for the past 24 hours with net -5 L for the admission.  Renal function overall stable.  BNP slowly improving. Weight trend 36.4 to 133.9 kg over the past 24 hours.  Drug screen positive for cocaine. She has tolerated the addition of spironolactone and and transition from lisinopril to losartan without issues.   Inpatient Medications    Scheduled Meds:  carvedilol  6.25 mg Oral BID WC   enoxaparin (LOVENOX) injection  0.5 mg/kg Subcutaneous Q24H   furosemide  80 mg Intravenous BID   insulin aspart  0-20 Units Subcutaneous TID WC   insulin aspart  0-5 Units Subcutaneous QHS   insulin aspart  4 Units Subcutaneous TID WC   losartan  25 mg Oral Daily   magnesium oxide  400 mg Oral Daily   metolazone  5 mg Oral Daily   sodium chloride flush  3 mL Intravenous Q12H   spironolactone  12.5 mg Oral Daily   Continuous Infusions:  sodium chloride     PRN Meds: sodium chloride, acetaminophen, ondansetron (ZOFRAN) IV, sodium chloride flush   Vital Signs    Vitals:   10/13/20 1235 10/13/20 1656 10/14/20 0411 10/14/20 0746  BP: 108/80 120/90 118/84 (!) 128/93  Pulse: 89 89 91 90  Resp: 18 18 16 16   Temp: 98.5 F (36.9 C) 98.1 F (36.7 C) 98.5 F (36.9 C) 97.9 F (36.6 C)  TempSrc: Oral   Oral  SpO2: 95% 94% (!) 63% 97%  Weight:   133.9 kg   Height:        Intake/Output Summary (Last 24 hours) at 10/14/2020 1031 Last data filed at 10/14/2020 0951 Gross per 24 hour  Intake 1200 ml  Output 2750 ml  Net -1550 ml   Filed Weights   10/12/20 0640 10/13/20 0447 10/14/20 0411  Weight: 135.6 kg (!) 136.4 kg 133.9 kg    Telemetry    SR with rare PVCs - Personally Reviewed  ECG    No new tracings -  Personally Reviewed  Physical Exam   GEN: No acute distress.   Neck: JVD elevated to the angle of the mandible. Cardiac: RRR, no murmurs, rubs, or gallops.  Respiratory: Diminished breath sounds along the bilateral bases.  GI: Soft, nontender, distended.   MS: 2+ bilateral lower extremity edema with chronic woody appearance; No deformity. Neuro:  Alert and oriented x 3; Nonfocal.  Psych: Normal affect.  Labs    Chemistry Recent Labs  Lab 10/12/20 0518 10/13/20 0551 10/14/20 0622  NA 135 132* 132*  K 3.3* 3.7 3.8  CL 92* 90* 88*  CO2 36* 36* 34*  GLUCOSE 117* 196* 183*  BUN 19 20 21*  CREATININE 0.94 1.01* 1.02*  CALCIUM 7.3* 7.5* 7.6*  GFRNONAA >60 >60 >60  ANIONGAP 7 6 10      Hematology Recent Labs  Lab 10/10/20 1312  WBC 5.1  RBC 3.77*  HGB 9.6*  HCT 31.9*  MCV 84.6  MCH 25.5*  MCHC 30.1  RDW 17.1*  PLT 407*    Cardiac EnzymesNo results for input(s): TROPONINI in the last 168 hours. No results for input(s): TROPIPOC in the last 168 hours.   BNP Recent Labs  Lab 10/10/20 1312 10/14/20 0622  BNP 841.3* 713.0*  DDimer No results for input(s): DDIMER in the last 168 hours.   Radiology    No results found.  Cardiac Studies   2D echo 10/11/2020: 1. Left ventricular ejection fraction, by estimation, is 30 to 35%. The  left ventricle has moderate to severely decreased function. The left  ventricle demonstrates global hypokinesis. Left ventricular diastolic  parameters are consistent with Grade II  diastolic dysfunction (pseudonormalization).   2. Right ventricular systolic function is mildly reduced. The right  ventricular size is normal.   3. A small pericardial effusion is present.   4. The mitral valve is normal in structure. No evidence of mitral valve  regurgitation.   5. Tricuspid valve regurgitation is moderate.   6. The aortic valve is tricuspid. Aortic valve regurgitation is not  visualized.  __________  2D echo 05/20/2020: 1.  Left ventricular ejection fraction, by estimation, is 20 to 25%. The  left ventricle has severely decreased function. The left ventricle  demonstrates global hypokinesis. The left ventricular internal cavity size  was mildly dilated. Indeterminate  diastolic filling due to E-A fusion.   2. Right ventricular systolic function is normal. The right ventricular  size is mildly enlarged. There is severely elevated pulmonary artery  systolic pressure.   3. The mitral valve is normal in structure. Trivial mitral valve  regurgitation. No evidence of mitral stenosis.   4. Tricuspid valve regurgitation is moderate.   5. The aortic valve was not well visualized. Aortic valve regurgitation  is not visualized. No aortic stenosis is present.   6. The inferior vena cava is dilated in size with <50% respiratory  variability, suggesting right atrial pressure of 15 mmHg. ____________  2D echo 10/03/2019:  1. Left ventricular ejection fraction, by estimation, is 35 to 40%. The  left ventricle has moderately decreased function. The left ventricle  demonstrates global hypokinesis. Left ventricular diastolic parameters are  consistent with Grade II diastolic  dysfunction (pseudonormalization).   2. Right ventricular systolic function is normal. The right ventricular  size is normal.   3. The mitral valve is normal in structure. No evidence of mitral valve  regurgitation. No evidence of mitral stenosis.   4. The aortic valve is normal in structure. Aortic valve regurgitation is  not visualized. No aortic stenosis is present.   5. The inferior vena cava is normal in size with greater than 50%  respiratory variability, suggesting right atrial pressure of 3 mmHg.    Patient Profile     35 y.o. female with history of HFrEF secondary to NICM, polysubstance abuse with ongoing cocaine use poorly controlled DM2, anemia, HLD, obesity, and GERD who we are evaluating for acute on chronic HFrEF.  Assessment & Plan     1.  Acute on chronic HFrEF secondary to NICM: -She remains significantly volume up -Net 5 L negative for the admission with renal function overall relatively stable -Continue IV Lasix and metolazone  -Continue GDMT including carvedilol, losartan in place of lisinopril (last dose of ACEi on 6/13 at 0840) and spironolactone -Look to transition losartan to Entresto, following 36-hour washout of ACEi, if BP allows -Look to add SGLT2i prior to discharge or in follow up if BP allows -Polysubstance abuse cessation is recommended, with ongoing substance abuse she is unlikely to be a candidate for advanced heart failure modalities -CHF education -Daily weights -Strict I's and O's  2.  HTN: -Blood pressure is well controlled -Continue current medical therapy as outlined above  3.  Polysubstance abuse: -Urine drug screen  positive for cocaine this admission -Complete cessation recommended, she indicates she will stop, stating "my life is more important"  4.  Hypokalemia: -Spironolactone added   5.  Hypomagnesemia: -Started on magnesium oxide  6.  DM2: -Poorly controlled with A1c 11.3 this admission -Per IM  7.  Anemia: -Hgb low though stable   For questions or updates, please contact [facility] Please consult [email or web address] for contact info under Cardiology/STEMI.    Signed, Eula Listen, PA-C [facility] Pager: [phone] 10/14/2020, 10:31 AM

## 2020-10-14 NOTE — Progress Notes (Addendum)
   Heart Failure Nurse Navigator Note  HFrEF 30-35%  Met with patient today, she is sitting in a chair at bedside.  She states that she feels that her breathing is about the same.  She did work with physical therapy today was able to ambulate to the bathroom  without too much difficulty.  She continues to have pitting edema to the tops of her thighs.  By teach back method discussed signs and symptoms to physician.  Also discussed weight gain and what to report to physician.  She needed a little reinforcement with that.  Also discussed her diet and things that she likes to eat.  Noted hot dogs which will be looked up on the Internet ,each 1 contains 546 mg of sodium.  She also likes to snack on lunchables, noted to contain 700 mg of sodium.  Again reinforced that 2000 mg sodium restriction.    Also discussed the 2000 mL fluid restriction, keeping a 2 L bottle  filled with water  and every time she drinks something would remove that amount from the bottle.  Also discussed with the hot/humid weather to increase fluid intake if she is perspiring a lot.  Also talked about the use of frozen grapes or  strawberries for quenching  thirst.  Pricilla Riffle RN CHFN  She is interested in viewing the videos on heart failure.

## 2020-10-14 NOTE — Progress Notes (Signed)
Physical Therapy Treatment Patient Details Name: Molly Howard MRN: [id number] DOB: 1985/10/10 Today's Date: 10/14/2020    History of Present Illness 35 y.o. 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  She states that a month ago her doctor cut her torsemide dose in half due to concern for her kidney function and since then she started retaining fluid.    PT Comments    Pt resting in bed upon PT arrival; agreeable to PT session; requesting to toilet in bathroom.  SBA with bed mobility with extra effort and time for pt to perform on own.  Bed height elevated slightly and pt able to stand up to RW with CGA and ambulate to bathroom with RW (with 1 brief standing rest break d/t SOB).  Switched pt to bariatric RW (d/t swelling pt with difficulty with keeping LE's inside of walker) and then pt demonstrating improved ability to keep LE's inside of walker/appeared to be better fit for pt.  D/t swelling, pt required assist for hygiene post toileting; OT consult placed to address pt's ADL's.  Pt stood from [facility] over toilet with CGA and pt ambulated back to chair with bariatric RW and brief standing rest break d/t SOB.  O2 sats WFL on room air during sessions activities; HR 92-110 bpm during sessions activities.  Will continue to focus on strengthening and progressive functional mobility per pt tolerance.    Follow Up Recommendations  SNF (with improvement of swelling--anticipate able to progress to home discharge)     Equipment Recommendations  Rolling walker with 5" wheels (bariatric)    Recommendations for Other Services       Precautions / Restrictions Precautions Precautions: Fall Restrictions Weight Bearing Restrictions: No    Mobility  Bed Mobility Overal bed  mobility: Needs Assistance Bed Mobility: Supine to Sit     Supine to sit: Supervision;HOB elevated     General bed mobility comments: increased effort/time for pt to perform on own; use of bed rail; increased effort/time to scoot hips towards edge of bed (pt requiring rest breaks d/t SOB)    Transfers Overall transfer level: Needs assistance Equipment used: Rolling walker (2 wheeled) Transfers: Sit to/from Stand Sit to Stand: Min guard         General transfer comment: CGA to stand from mildly elevated bed up to RW; CGA to stand x2 trials from [facility] over toilet using bariatric RW  Ambulation/Gait Ambulation/Gait assistance: Min guard Gait Distance (Feet):  (25 feet x2 (bed to bathroom; bathroom to recliner)) Assistive device: Rolling walker (2 wheeled) (RW to bathroom; bariatric RW walking bathroom to recliner)   Gait velocity: decreased   General Gait Details: partial step through gait pattern; steady with RW; 1 brief standing rest break on way to bathroom and on way back from bathroom d/t SOB   Stairs             Wheelchair Mobility    Modified Rankin (Stroke Patients Only)       Balance Overall balance assessment: Needs assistance Sitting-balance support: No upper extremity supported;Feet supported Sitting balance-Leahy Scale: Good Sitting balance - Comments: steady sitting reaching within BOS   Standing balance support: No upper extremity supported Standing balance-Leahy Scale: Good Standing balance comment: steady standing washing hands at sink  Cognition Arousal/Alertness: Awake/alert Behavior During Therapy: WFL for tasks assessed/performed Overall Cognitive Status: Within Functional Limits for tasks assessed                                        Exercises      General Comments  Nursing cleared pt for participation in physical therapy.  Pt agreeable to PT session.      Pertinent  Vitals/Pain Pain Assessment: Faces Faces Pain Scale: Hurts little more Pain Location: B LE's tight/swollen Pain Descriptors / Indicators: Tightness Pain Intervention(s): Limited activity within patient's tolerance;Monitored during session;Repositioned    Home Living                      Prior Function            PT Goals (current goals can now be found in the care plan section) Acute Rehab PT Goals Patient Stated Goal: get swelling down and go home PT Goal Formulation: With patient Time For Goal Achievement: 10/26/20 Potential to Achieve Goals: Fair Progress towards PT goals: Progressing toward goals    Frequency    Min 2X/week      PT Plan Current plan remains appropriate    Co-evaluation              AM-PAC PT "6 Clicks" Mobility   Outcome Measure  Help needed turning from your back to your side while in a flat bed without using bedrails?: None Help needed moving from lying on your back to sitting on the side of a flat bed without using bedrails?: A Little Help needed moving to and from a bed to a chair (including a wheelchair)?: A Little Help needed standing up from a chair using your arms (e.g., wheelchair or bedside chair)?: A Little Help needed to walk in hospital room?: A Little Help needed climbing 3-5 steps with a railing? : A Lot 6 Click Score: 18    End of Session Equipment Utilized During Treatment: Gait belt Activity Tolerance: Patient tolerated treatment well Patient left: in chair;with call bell/phone within reach (purewick in place) Nurse Communication: Mobility status;Precautions;Other (comment) (pt's vitals during session and pt toileted in bathroom (urinated and had BM)) PT Visit Diagnosis: Muscle weakness (generalized) (M62.81);Difficulty in walking, not elsewhere classified (R26.2);Unsteadiness on feet (R26.81)     Time: 1638-4536 PT Time Calculation (min) (ACUTE ONLY): 38 min  Charges:  $Gait Training: 8-22 mins $Therapeutic  Activity: 23-37 mins                     Hendricks Limes, PT 10/14/20, 10:33 AM

## 2020-10-14 NOTE — Evaluation (Signed)
Occupational Therapy Evaluation Patient Details Name: Molly Howard MRN: [id number] DOB: [birth year] Today's Date: 10/14/2020    History of Present Illness 35 y.o. 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  She states that a month ago her doctor cut her torsemide dose in half due to concern for her kidney function and since then she started retaining fluid.   Clinical Impression   Patient presenting with decreased I in self care, balance, functional mobility/transfers, endurance, and safety awareness. Patient reports she lives with family and that her mother and sister assist her with self care PTA. OT discussing significant amount of assistance she needed to stand from recliner chair and pt endorses her mother and sister both having to assist her at time to come into standing. Her mother assists her with bathing at sink and LB self care. Patient currently functioning at min - max A ( sit <>stand and LB self care). Pt reporting weaker and needing more assistance but that family has been able to provide this level of care. Pt hopes to get stronger before returning home . Patient will benefit from acute OT to increase overall independence in the areas of ADLs, functional mobility, and safety awareness in order to safely discharge home with family assist.    Follow Up Recommendations  Home health OT;Supervision/Assistance - 24 hour    Equipment Recommendations  3 in 1 bedside commode       Precautions / Restrictions Precautions Precautions: Fall      Mobility Bed Mobility Overal bed mobility: Needs Assistance Bed Mobility: Sit to Supine       Sit to supine: Max assist   General bed mobility comments: heavy assistance to stand from recliner chair with  therapist in the front and pulling her into anterior weight shift to stand from recliner height.    Transfers Overall transfer level: Needs assistance Equipment used: 1 person hand held assist Transfers: Sit to/from Stand Sit to Stand: Min assist              Balance Overall balance assessment: Needs assistance Sitting-balance support: No upper extremity supported;Feet supported Sitting balance-Leahy Scale: Good Sitting balance - Comments: steady sitting reaching within BOS   Standing balance support: During functional activity Standing balance-Leahy Scale: Fair                             ADL either performed or assessed with clinical judgement   ADL Overall ADL's : Needs assistance/impaired     Grooming: Wash/dry hands;Wash/dry face;Sitting;Set up               Lower Body Dressing: Moderate assistance;Maximal assistance;Sit to/from stand   Toilet Transfer: Moderate assistance;BSC Toilet Transfer Details (indicate cue type and reason): simulated                 Vision Baseline Vision/History: No visual deficits Patient Visual Report: No change from baseline              Pertinent Vitals/Pain Pain Assessment: Faces Faces Pain Scale: Hurts little more Pain Location: B LE's tight/swollen Pain Descriptors / Indicators: Tightness Pain Intervention(s): Limited activity within patient's tolerance;Monitored during session;Repositioned     Hand Dominance Right   Extremity/Trunk Assessment Upper Extremity Assessment  Upper Extremity Assessment: Generalized weakness   Lower Extremity Assessment Lower Extremity Assessment: Generalized weakness       Communication Communication Communication: No difficulties   Cognition Arousal/Alertness: Awake/alert Behavior During Therapy: WFL for tasks assessed/performed Overall Cognitive Status: Within Functional Limits for tasks assessed                                                 Home Living Family/patient expects to be discharged to:: Private residence Living Arrangements: Parent;Other relatives Available Help at Discharge: Family;Available 24 hours/day Type of Home: House Home Access: Stairs to enter [employer] of Steps: 3 Entrance Stairs-Rails: None Home Layout: One level     Bathroom Shower/Tub: Tub/shower unit         Home Equipment: Cane - single point          Prior Functioning/Environment Level of Independence: Independent with assistive device(s);Needs assistance  Gait / Transfers Assistance Needed: use of cane for functional mobility ADL's / Homemaking Assistance Needed: Pt takes sink baths with assistance from her mother for bathing and dressing ( LB self care)   Comments: Pt using cane for mobility recently        OT Problem List: Decreased strength;Decreased knowledge of use of DME or AE;Decreased activity tolerance;Impaired balance (sitting and/or standing);Decreased safety awareness;Pain      OT Treatment/Interventions: Self-care/ADL training;Manual therapy;Therapeutic exercise;Modalities;Patient/family education;Balance training;Energy conservation;Therapeutic activities;DME and/or AE instruction    OT Goals(Current goals can be found in the care plan section) Acute Rehab OT Goals Patient Stated Goal: get swelling down and go home OT Goal Formulation: With patient Time For Goal Achievement: 10/28/20 Potential to Achieve Goals: Good ADL Goals Pt Will Perform Grooming: with supervision;standing Pt Will Perform Lower Body Dressing: with min assist;sit to/from stand Pt Will Transfer to Toilet: with min assist;ambulating Pt Will Perform Toileting - Clothing Manipulation and hygiene: with min assist;sit to/from stand  OT Frequency: Min 2X/week   Barriers to D/C:    none known at this time          AM-PAC OT "6 Clicks" Daily Activity     Outcome Measure Help from another person eating meals?: None Help from  another person taking care of personal grooming?: None Help from another person toileting, which includes using toliet, bedpan, or urinal?: A Lot Help from another person bathing (including washing, rinsing, drying)?: A Lot Help from another person to put on and taking off regular upper body clothing?: None Help from another person to put on and taking off regular lower body clothing?: A Lot 6 Click Score: 18   End of Session Nurse Communication: Mobility status  Activity Tolerance: Patient limited by fatigue Patient left: in bed;with call bell/phone within reach;with bed alarm set  OT Visit Diagnosis: Unsteadiness on feet (R26.81);Muscle weakness (generalized) (M62.81);Repeated falls (R29.6)                Time: 1430-1453 OT Time Calculation (min): 23 min Charges:  OT General Charges $OT Visit: 1 Visit OT Evaluation $OT Eval Moderate Complexity: 1 Mod OT Treatments $Therapeutic Activity: 8-22 mins  Jackquline Denmark, MS, OTR/L , CBIS ascom [phone]  10/14/20, 4:16 PM

## 2020-10-14 NOTE — Progress Notes (Signed)
PROGRESS NOTE    Molly Howard  HUT:[id number] DOB: Aug 26, 1985 DOA: 10/10/2020 PCP: Patient, No Pcp Per (Inactive)    Brief Narrative:  Molly Howard is a 35 y.o. 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  She stated that a month ago her doctor cut her torsemide dose in half due to concern for her kidney function and since then she started retaining fluid. Admitted for CHF. Non compliant with meds. Cocaine positive. On lasix iv. Cardiology on board.    6/14-still with sob, mildly improved. Still unable to ambulate due to LE edema. No other complaints.   Consultants:  cardiology  Procedures:   Antimicrobials:      Subjective: No cp, dizziness, abd pain  Objective: Vitals:   10/14/20 0411 10/14/20 0746 10/14/20 1035 10/14/20 1206  BP: 118/84 (!) 128/93 112/70 109/84  Pulse: 91 90  90  Resp: 16 16  18   Temp: 98.5 F (36.9 C) 97.9 F (36.6 C)  98 F (36.7 C)  TempSrc:  Oral    SpO2: (!) 63% 97%  97%  Weight: 133.9 kg     Height:        Intake/Output Summary (Last 24 hours) at 10/14/2020 1558 Last data filed at 10/14/2020 1434 Gross per 24 hour  Intake 960 ml  Output 2250 ml  Net -1290 ml   Filed Weights   10/12/20 0640 10/13/20 0447 10/14/20 0411  Weight: 135.6 kg (!) 136.4 kg 133.9 kg    Examination: Calm, sitting in chair Decreased breath sounds no wheezing Regular S1-S2 no gallops Soft benign +bs +edema LE 3+ pitting up to upper thigh Aaxox4 Mood and affect appropriate in current setting   Data Reviewed: I have personally reviewed following labs and imaging studies  CBC: Recent Labs  Lab 10/10/20 1312  WBC 5.1  HGB 9.6*  HCT 31.9*  MCV 84.6  PLT 407*   Basic Metabolic Panel: Recent Labs  Lab 10/10/20 1312  10/11/20 0417 10/12/20 0518 10/13/20 0551 10/14/20 0622  NA 134* 133* 135 132* 132*  K 4.2 3.6 3.3* 3.7 3.8  CL 92* 92* 92* 90* 88*  CO2 32 32 36* 36* 34*  GLUCOSE 367* 332* 117* 196* 183*  BUN 20 19 19 20  21*  CREATININE 1.05* 0.99 0.94 1.01* 1.02*  CALCIUM 7.3* 7.4* 7.3* 7.5* 7.6*  MG  --   --  1.6* 1.7  --    GFR: Estimated Creatinine Clearance: 108.9 mL/min (A) (by C-G formula based on SCr of 1.02 mg/dL (H)). Liver Function Tests: No results for input(s): AST, ALT, ALKPHOS, BILITOT, PROT, ALBUMIN in the last 168 hours. No results for input(s): LIPASE, AMYLASE in the last 168 hours. No results for input(s): AMMONIA in the last 168 hours. Coagulation Profile: No results for input(s): INR, PROTIME in the last 168 hours. Cardiac Enzymes: No results for input(s): CKTOTAL, CKMB, CKMBINDEX, TROPONINI in the last 168 hours. BNP (last 3 results) No results for input(s): PROBNP in the last 8760 hours. HbA1C: No results for input(s): HGBA1C in the last 72 hours. CBG: Recent Labs  Lab 10/13/20 1230 10/13/20 1640 10/13/20 2334 10/14/20 0750 10/14/20 1209  GLUCAP 198* 182* 175* 175* 218*   Lipid Profile: No results for input(s): CHOL, HDL, LDLCALC, TRIG, CHOLHDL, LDLDIRECT in the last 72  hours. Thyroid Function Tests: No results for input(s): TSH, T4TOTAL, FREET4, T3FREE, THYROIDAB in the last 72 hours. Anemia Panel: No results for input(s): VITAMINB12, FOLATE, FERRITIN, TIBC, IRON, RETICCTPCT in the last 72 hours. Sepsis Labs: No results for input(s): PROCALCITON, LATICACIDVEN in the last 168 hours.  Recent Results (from the past 240 hour(s))  Resp Panel by RT-PCR (Flu A&B, Covid) Nasopharyngeal Swab     Status: None   Collection Time: 10/11/20 12:29 AM   Specimen: Nasopharyngeal Swab; Nasopharyngeal(NP) swabs in vial transport medium  Result Value Ref Range Status   SARS Coronavirus 2 by RT PCR NEGATIVE NEGATIVE Final    Comment: (NOTE) SARS-CoV-2 target nucleic acids  are NOT DETECTED.  The SARS-CoV-2 RNA is generally detectable in upper respiratory specimens during the acute phase of infection. The lowest concentration of SARS-CoV-2 viral copies this assay can detect is 138 copies/mL. A negative result does not preclude SARS-Cov-2 infection and should not be used as the sole basis for treatment or other patient management decisions. A negative result may occur with  improper specimen collection/handling, submission of specimen other than nasopharyngeal swab, presence of viral mutation(s) within the areas targeted by this assay, and inadequate number of viral copies(<138 copies/mL). A negative result must be combined with clinical observations, patient history, and epidemiological information. The expected result is Negative.  Fact Sheet for Patients:  BloggerCourse.com  Fact Sheet for Healthcare Providers:  SeriousBroker.it  This test is no t yet approved or cleared by the [place] FDA and  has been authorized for detection and/or diagnosis of SARS-CoV-2 by FDA under an Emergency Use Authorization (EUA). This EUA will remain  in effect (meaning this test can be used) for the duration of the COVID-19 declaration under Section 564(b)(1) of the Act, 21 U.S.C.section 360bbb-3(b)(1), unless the authorization is terminated  or revoked sooner.       Influenza A by PCR NEGATIVE NEGATIVE Final   Influenza B by PCR NEGATIVE NEGATIVE Final    Comment: (NOTE) The Xpert Xpress SARS-CoV-2/FLU/RSV plus assay is intended as an aid in the diagnosis of influenza from Nasopharyngeal swab specimens and should not be used as a sole basis for treatment. Nasal washings and aspirates are unacceptable for Xpert Xpress SARS-CoV-2/FLU/RSV testing.  Fact Sheet for Patients: BloggerCourse.com  Fact Sheet for Healthcare Providers: SeriousBroker.it  This test is  not yet approved or cleared by the [place] FDA and has been authorized for detection and/or diagnosis of SARS-CoV-2 by FDA under an Emergency Use Authorization (EUA). This EUA will remain in effect (meaning this test can be used) for the duration of the COVID-19 declaration under Section 564(b)(1) of the Act, 21 U.S.C. section 360bbb-3(b)(1), unless the authorization is terminated or revoked.  Performed at [facility], [address], [place], [place] [address]          Radiology Studies: No results found.      Scheduled Meds:  carvedilol  6.25 mg Oral BID WC   enoxaparin (LOVENOX) injection  0.5 mg/kg Subcutaneous Q24H   furosemide  80 mg Intravenous BID   insulin aspart  0-20 Units Subcutaneous TID WC   insulin aspart  0-5 Units Subcutaneous QHS   insulin aspart  4 Units Subcutaneous TID WC   losartan  25 mg Oral Daily   magnesium oxide  400 mg Oral Daily   metolazone  5 mg Oral Daily   sodium chloride flush  3 mL Intravenous Q12H   spironolactone  12.5 mg Oral Daily  Continuous Infusions:  sodium chloride      Assessment & Plan:   Principal Problem:   Acute on chronic combined systolic and diastolic heart failure (HCC) Active Problems:   NICM (nonischemic cardiomyopathy) (HCC)   Essential hypertension   Chronic anemia   Anasarca   Hyperglycemia due to type 2 diabetes mellitus (HCC)   35 year old female with DM, HTN, chronic combined heart failure last EF 20 to 25% in January 2022, w presenting with fluid retention for the past month now associated with shortness of breath with exertion and orthopnea.     Acute on chronic combined systolic and diastolic CHF   NICM (nonischemic cardiomyopathy) (HCC)   Anasarca    Pericardial effusion on chest x-ray - Patient with generalized edema, dyspnea on exertion and orthopnea since reduction in torsemide dose a month prior - BNP was elevated cardiology following Echo EF 30-35% which has improved  from 1/22 echo Card switchedw lisinopril to losartan to facilitate the transition to Entresto if BP allows Low-dose spironolactone was added due to significant hypokalemia Cause we will consider Farxiga as outpatient 6/14- continue iv lasix and metalozone If no improvement, may need milrinone , d/w Dr. 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   Continue NovoLog with meals and R-ISS Will add novolog 4 units with meals     Essential hypertension Stable continue Lasix, losartan, Coreg    Hypokalemia/hypomagnesemia Replaced. Now on spironolactone, and stable Monitor   Cocaine abuse- positive again on toxicology Patient was extensively counseled on complications of cocaine abuse including death and she verbalizes an understanding.  Encouraged to follow-up with outpatient rehab so she can quit         Chronic anemia Stable, monitor H&H periodically        DVT prophylaxis: lovenox Code Status:full Family Communication: none at bedside Disposition Plan:  Status is: Inpatient  Remains inpatient appropriate because:Inpatient level of care appropriate due to severity of illness  Dispo: The patient is from: Home              Anticipated d/c is to: SNF              Patient currently is not medically stable to d/c.   Difficult to place patient No     Still with anasarca.  On IV Lasix.  She may need milrinone will need this discussion with cardiology in a.m. and they will decide then.  So far she needs SNF unless she is able to ambulate once she loses more fluid from her legs.      LOS: 3 days   Time spent: 35 min with >50% on coc    Molly Ito, MD [facility] Pager [phone]  If 7PM-7AM, please contact night-coverage 10/14/2020, 3:58 PM

## 2020-10-15 DIAGNOSIS — E1165 Type 2 diabetes mellitus with hyperglycemia: Secondary | ICD-10-CM

## 2020-10-15 LAB — GLUCOSE, CAPILLARY
Glucose-Capillary: 142 mg/dL — ABNORMAL HIGH (ref 70–99)
Glucose-Capillary: 167 mg/dL — ABNORMAL HIGH (ref 70–99)
Glucose-Capillary: 175 mg/dL — ABNORMAL HIGH (ref 70–99)
Glucose-Capillary: 221 mg/dL — ABNORMAL HIGH (ref 70–99)

## 2020-10-15 LAB — CBC
HCT: 33.3 % — ABNORMAL LOW (ref 36.0–46.0)
Hemoglobin: 10.3 g/dL — ABNORMAL LOW (ref 12.0–15.0)
MCH: 25.6 pg — ABNORMAL LOW (ref 26.0–34.0)
MCHC: 30.9 g/dL (ref 30.0–36.0)
MCV: 82.8 fL (ref 80.0–100.0)
Platelets: 482 10*3/uL — ABNORMAL HIGH (ref 150–400)
RBC: 4.02 MIL/uL (ref 3.87–5.11)
RDW: 17.5 % — ABNORMAL HIGH (ref 11.5–15.5)
WBC: 4.2 10*3/uL (ref 4.0–10.5)
nRBC: 0 % (ref 0.0–0.2)

## 2020-10-15 LAB — BASIC METABOLIC PANEL
Anion gap: 10 (ref 5–15)
BUN: 21 mg/dL — ABNORMAL HIGH (ref 6–20)
CO2: 38 mmol/L — ABNORMAL HIGH (ref 22–32)
Calcium: 7.8 mg/dL — ABNORMAL LOW (ref 8.9–10.3)
Chloride: 87 mmol/L — ABNORMAL LOW (ref 98–111)
Creatinine, Ser: 0.95 mg/dL (ref 0.44–1.00)
GFR, Estimated: >60 mL/min (ref >60)
Glucose, Bld: 135 mg/dL — ABNORMAL HIGH (ref 70–99)
Potassium: 3.7 mmol/L (ref 3.5–5.1)
Sodium: 135 mmol/L (ref 135–145)

## 2020-10-15 NOTE — Progress Notes (Signed)
PROGRESS NOTE    Molly Howard  HWE:[id number] DOB: February 08, 1986 DOA: 10/10/2020 PCP: Patient, No Pcp Per (Inactive)   Chief complaint.  Shortness of breath. Brief Narrative:  Molly Howard is a 35 y.o. 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  She stated that a month ago her doctor cut her torsemide dose in half due to concern for her kidney function and since then she started retaining fluid. Admitted for CHF. Non compliant with meds. Cocaine positive. On lasix iv. Cardiology on board.   Assessment & Plan:   Principal Problem:   Acute on chronic combined systolic and diastolic heart failure (HCC) Active Problems:   NICM (nonischemic cardiomyopathy) (HCC)   Essential hypertension   Chronic anemia   Anasarca   Hyperglycemia due to type 2 diabetes mellitus (HCC)  #1.  Acute on chronic combined systolic and diastolic congestive heart failure. Nonischemic cardiomyopathy. Anasarca. Pericardial effusion on chest x-ray. Patient ejection fraction was 30 to 35%.  Chest x-ray has pericardial effusion. Discussed with cardiology, patient does not have any evidence of cardiac tamponade. Has significant volume overload, continue IV Lasix, metolazone.  Continue losartan. Patient was also started on lower dose spironolactone.  #2.  Uncontrolled type 2 diabetes with hyperglycemia. Hemoglobin A1c 11.3. Continue current regimen, glucose is better.  3.  Cocaine abuse. Advised to quit    DVT prophylaxis: Lovenox Code Status: full Family Communication:  Disposition Plan:    Status is: Inpatient  Remains inpatient appropriate because:IV treatments appropriate due to intensity of illness or inability to take PO and Inpatient level of care appropriate due to  severity of illness  Dispo: The patient is from: Home              Anticipated d/c is to: Home              Patient currently is not medically stable to d/c.   Difficult to place patient No        I/O last 3 completed shifts: In: 720 [P.O.:720] Out: 2400 [Urine:2400] Total I/O In: 120 [P.O.:120] Out: 1000 [Urine:1000]     Consultants:  Cardiology  Procedures: None  Antimicrobials: None  Subjective: Patient doing better today, still making large quantity of urine, short of breath improving.  Not on oxygen. Denies any abdominal pain or nausea vomiting.  No diarrhea constipation. No chest pain palpitation. No dysuria hematuria  No fever chills   Objective: Vitals:   10/14/20 1206 10/14/20 1607 10/15/20 0130 10/15/20 0754  BP: 109/84 (!) 119/91  108/73  Pulse: 90 89  89  Resp: 18 18  18   Temp: 98 F (36.7 C) 98.2 F (36.8 C)  97.9 F (36.6 C)  TempSrc:      SpO2: 97% 100%  94%  Weight:   132.4 kg   Height:        Intake/Output Summary (Last 24 hours) at 10/15/2020 1048 Last data filed at 10/15/2020 1017 Gross per 24 hour  Intake 360 ml  Output 1950 ml  Net -1590 ml   Filed Weights   10/13/20 0447 10/14/20 0411 10/15/20 0130  Weight: (!) 136.4 kg 133.9 kg 132.4 kg    Examination:  General exam: Appears calm and comfortable  Respiratory system: Clear to auscultation. Respiratory effort normal. Cardiovascular system: S1 &  S2 heard, RRR. No JVD, murmurs, rubs, gallops or clicks.  Gastrointestinal system: Abdomen is mildly distended, soft and nontender. No organomegaly or masses felt. Normal bowel sounds heard. Central nervous system: Alert and oriented. No focal neurological deficits. Extremities: 2+ leg edema Skin: No rashes, lesions or ulcers Psychiatry: Judgement and insight appear normal. Mood & affect appropriate.     Data Reviewed: I have personally reviewed following labs and imaging studies  CBC: Recent Labs  Lab 10/10/20 1312  10/15/20 0715  WBC 5.1 4.2  HGB 9.6* 10.3*  HCT 31.9* 33.3*  MCV 84.6 82.8  PLT 407* 482*   Basic Metabolic Panel: Recent Labs  Lab 10/11/20 0417 10/12/20 0518 10/13/20 0551 10/14/20 0622 10/15/20 0715  NA 133* 135 132* 132* 135  K 3.6 3.3* 3.7 3.8 3.7  CL 92* 92* 90* 88* 87*  CO2 32 36* 36* 34* 38*  GLUCOSE 332* 117* 196* 183* 135*  BUN 19 19 20  21* 21*  CREATININE 0.99 0.94 1.01* 1.02* 0.95  CALCIUM 7.4* 7.3* 7.5* 7.6* 7.8*  MG  --  1.6* 1.7  --   --    GFR: Estimated Creatinine Clearance: 116.2 mL/min (by C-G formula based on SCr of 0.95 mg/dL). Liver Function Tests: No results for input(s): AST, ALT, ALKPHOS, BILITOT, PROT, ALBUMIN in the last 168 hours. No results for input(s): LIPASE, AMYLASE in the last 168 hours. No results for input(s): AMMONIA in the last 168 hours. Coagulation Profile: No results for input(s): INR, PROTIME in the last 168 hours. Cardiac Enzymes: No results for input(s): CKTOTAL, CKMB, CKMBINDEX, TROPONINI in the last 168 hours. BNP (last 3 results) No results for input(s): PROBNP in the last 8760 hours. HbA1C: No results for input(s): HGBA1C in the last 72 hours. CBG: Recent Labs  Lab 10/14/20 0750 10/14/20 1209 10/14/20 1639 10/14/20 2018 10/15/20 0754  GLUCAP 175* 218* 191* 196* 142*   Lipid Profile: No results for input(s): CHOL, HDL, LDLCALC, TRIG, CHOLHDL, LDLDIRECT in the last 72 hours. Thyroid Function Tests: No results for input(s): TSH, T4TOTAL, FREET4, T3FREE, THYROIDAB in the last 72 hours. Anemia Panel: No results for input(s): VITAMINB12, FOLATE, FERRITIN, TIBC, IRON, RETICCTPCT in the last 72 hours. Sepsis Labs: No results for input(s): PROCALCITON, LATICACIDVEN in the last 168 hours.  Recent Results (from the past 240 hour(s))  Resp Panel by RT-PCR (Flu A&B, Covid) Nasopharyngeal Swab     Status: None   Collection Time: 10/11/20 12:29 AM   Specimen: Nasopharyngeal Swab; Nasopharyngeal(NP) swabs in vial transport  medium  Result Value Ref Range Status   SARS Coronavirus 2 by RT PCR NEGATIVE NEGATIVE Final    Comment: (NOTE) SARS-CoV-2 target nucleic acids are NOT DETECTED.  The SARS-CoV-2 RNA is generally detectable in upper respiratory specimens during the acute phase of infection. The lowest concentration of SARS-CoV-2 viral copies this assay can detect is 138 copies/mL. A negative result does not preclude SARS-Cov-2 infection and should not be used as the sole basis for treatment or other patient management decisions. A negative result may occur with  improper specimen collection/handling, submission of specimen other than nasopharyngeal swab, presence of viral mutation(s) within the areas targeted by this assay, and inadequate number of viral copies(<138 copies/mL). A negative result must be combined with clinical observations, patient history, and epidemiological information. The expected result is Negative.  Fact Sheet for Patients:  12/11/20  Fact Sheet for Healthcare Providers:  BloggerCourse.com  This test is no t yet approved or cleared by the [employer]  FDA and  has been authorized for detection and/or diagnosis of SARS-CoV-2 by FDA under an Emergency Use Authorization (EUA). This EUA will remain  in effect (meaning this test can be used) for the duration of the COVID-19 declaration under Section 564(b)(1) of the Act, 21 U.S.C.section 360bbb-3(b)(1), unless the authorization is terminated  or revoked sooner.       Influenza A by PCR NEGATIVE NEGATIVE Final   Influenza B by PCR NEGATIVE NEGATIVE Final    Comment: (NOTE) The Xpert Xpress SARS-CoV-2/FLU/RSV plus assay is intended as an aid in the diagnosis of influenza from Nasopharyngeal swab specimens and should not be used as a sole basis for treatment. Nasal washings and aspirates are unacceptable for Xpert Xpress SARS-CoV-2/FLU/RSV testing.  Fact Sheet for  Patients: BloggerCourse.com  Fact Sheet for Healthcare Providers: SeriousBroker.it  This test is not yet approved or cleared by the [place] FDA and has been authorized for detection and/or diagnosis of SARS-CoV-2 by FDA under an Emergency Use Authorization (EUA). This EUA will remain in effect (meaning this test can be used) for the duration of the COVID-19 declaration under Section 564(b)(1) of the Act, 21 U.S.C. section 360bbb-3(b)(1), unless the authorization is terminated or revoked.  Performed at [facility], [address] [place], [place] [address]          Radiology Studies: No results found.      Scheduled Meds:  carvedilol  6.25 mg Oral BID WC   enoxaparin (LOVENOX) injection  0.5 mg/kg Subcutaneous Q24H   furosemide  80 mg Intravenous Q8H   insulin aspart  0-20 Units Subcutaneous TID WC   insulin aspart  0-5 Units Subcutaneous QHS   insulin aspart  4 Units Subcutaneous TID WC   losartan  25 mg Oral Daily   magnesium oxide  400 mg Oral Daily   metolazone  5 mg Oral Daily   sodium chloride flush  3 mL Intravenous Q12H   spironolactone  12.5 mg Oral Daily   Continuous Infusions:  sodium chloride       LOS: 4 days    Time spent: 32 minutes    Marrion Coy, MD [facility]   To contact the attending provider between 7A-7P or the covering provider during after hours 7P-7A, please log into the web site [email or web address] and access using universal [place] password for that web site. If you do not have the password, please call the hospital operator.  10/15/2020, 10:48 AM

## 2020-10-15 NOTE — Progress Notes (Signed)
Inpatient Diabetes Program Recommendations  AACE/ADA: New Consensus Statement on Inpatient Glycemic Control  Target Ranges:  Prepandial:   less than 140 mg/dL      Peak postprandial:   less than 180 mg/dL (1-2 hours)      Critically ill patients:  140 - 180 mg/dL   Results for Molly Howard, Molly Howard (MRN [id number]) as of 10/15/2020 08:27  Ref. Range 10/14/2020 07:50 10/14/2020 12:09 10/14/2020 16:39 10/14/2020 20:18 10/15/2020 07:54  Glucose-Capillary Latest Ref Range: 70 - 99 mg/dL 762 (H) 831 (H) 517 (H) 196 (H) 142 (H)   Review of Glycemic Control  Diabetes history: DM2 Outpatient Diabetes medications: 70/30 10 units BID Current orders for Inpatient glycemic control: Novolog 0-20 units TID with meals, Novolog 0-5 units QHS, Novolog 4 units TID with meals   Inpatient Diabetes Program Recommendations:     Insulin: Please consider increasing meal coverage to Novolog 7 units TID with meals if patient eats at least 50% of meals.   HbgA1C:  A1C 11.3% on 10/11/20 indicating an average glucose of 278 mg/dl over the past 2-3 months. TOC following to assist with patient obtaining discharge medications (no insurance).  Thanks, Orlando Penner, RN, MSN, CDE Diabetes Coordinator Inpatient Diabetes Program [phone] (Team Pager from 8am to 5pm)

## 2020-10-15 NOTE — Progress Notes (Signed)
Progress Note  Patient Name: Molly Howard Date of Encounter: 10/15/2020  Primary Cardiologist: Agbor-Etang  Subjective   Dyspnea and lower extremity swelling slightly improved over the past 24 hours. No chest pain or palpitations. Abdomen remains distended. Orthopnea persists. Documented urine output 780 mL for the past 24 hours with net -6.9 L for the admission.  Renal function overall stable. Weight trend 133.9 to 132.4 kg over the past 24 hours.  Drug screen positive for cocaine. She has tolerated the addition of spironolactone and and transition from lisinopril to losartan without issues.   Inpatient Medications    Scheduled Meds:  carvedilol  6.25 mg Oral BID WC   enoxaparin (LOVENOX) injection  0.5 mg/kg Subcutaneous Q24H   furosemide  80 mg Intravenous Q8H   insulin aspart  0-20 Units Subcutaneous TID WC   insulin aspart  0-5 Units Subcutaneous QHS   insulin aspart  4 Units Subcutaneous TID WC   losartan  25 mg Oral Daily   magnesium oxide  400 mg Oral Daily   metolazone  5 mg Oral Daily   sodium chloride flush  3 mL Intravenous Q12H   spironolactone  12.5 mg Oral Daily   Continuous Infusions:  sodium chloride     PRN Meds: sodium chloride, acetaminophen, ondansetron (ZOFRAN) IV, sodium chloride flush   Vital Signs    Vitals:   10/14/20 1206 10/14/20 1607 10/15/20 0130 10/15/20 0754  BP: 109/84 (!) 119/91  108/73  Pulse: 90 89  89  Resp: 18 18  18   Temp: 98 F (36.7 C) 98.2 F (36.8 C)  97.9 F (36.6 C)  TempSrc:      SpO2: 97% 100%  94%  Weight:   132.4 kg   Height:        Intake/Output Summary (Last 24 hours) at 10/15/2020 1012 Last data filed at 10/15/2020 10/17/2020 Gross per 24 hour  Intake 240 ml  Output 2100 ml  Net -1860 ml    Filed Weights   10/13/20 0447 10/14/20 0411 10/15/20 0130  Weight: (!) 136.4 kg 133.9 kg 132.4 kg    Telemetry    SR with rare PVCs and rare ventricular triplet - Personally Reviewed  ECG    No new tracings -  Personally Reviewed  Physical Exam   GEN: No acute distress.   Neck: JVD elevated to the angle of the mandible. Cardiac: RRR, no murmurs, rubs, or gallops.  Respiratory: Diminished breath sounds along the bilateral bases.  GI: Soft, nontender, distended.   MS: 2+ bilateral lower extremity edema with chronic woody appearance that is starting to soften; No deformity. Neuro:  Alert and oriented x 3; Nonfocal.  Psych: Normal affect.  Labs    Chemistry Recent Labs  Lab 10/13/20 0551 10/14/20 0622 10/15/20 0715  NA 132* 132* 135  K 3.7 3.8 3.7  CL 90* 88* 87*  CO2 36* 34* 38*  GLUCOSE 196* 183* 135*  BUN 20 21* 21*  CREATININE 1.01* 1.02* 0.95  CALCIUM 7.5* 7.6* 7.8*  GFRNONAA >60 >60 >60  ANIONGAP 6 10 10       Hematology Recent Labs  Lab 10/10/20 1312 10/15/20 0715  WBC 5.1 4.2  RBC 3.77* 4.02  HGB 9.6* 10.3*  HCT 31.9* 33.3*  MCV 84.6 82.8  MCH 25.5* 25.6*  MCHC 30.1 30.9  RDW 17.1* 17.5*  PLT 407* 482*     Cardiac EnzymesNo results for input(s): TROPONINI in the last 168 hours. No results for input(s): TROPIPOC in the  last 168 hours.   BNP Recent Labs  Lab 10/10/20 1312 10/14/20 0622  BNP 841.3* 713.0*      DDimer No results for input(s): DDIMER in the last 168 hours.   Radiology    No results found.  Cardiac Studies   2D echo 10/11/2020: 1. Left ventricular ejection fraction, by estimation, is 30 to 35%. The  left ventricle has moderate to severely decreased function. The left  ventricle demonstrates global hypokinesis. Left ventricular diastolic  parameters are consistent with Grade II  diastolic dysfunction (pseudonormalization).   2. Right ventricular systolic function is mildly reduced. The right  ventricular size is normal.   3. A small pericardial effusion is present.   4. The mitral valve is normal in structure. No evidence of mitral valve  regurgitation.   5. Tricuspid valve regurgitation is moderate.   6. The aortic valve is  tricuspid. Aortic valve regurgitation is not  visualized.  __________  2D echo 05/20/2020: 1. Left ventricular ejection fraction, by estimation, is 20 to 25%. The  left ventricle has severely decreased function. The left ventricle  demonstrates global hypokinesis. The left ventricular internal cavity size  was mildly dilated. Indeterminate  diastolic filling due to E-A fusion.   2. Right ventricular systolic function is normal. The right ventricular  size is mildly enlarged. There is severely elevated pulmonary artery  systolic pressure.   3. The mitral valve is normal in structure. Trivial mitral valve  regurgitation. No evidence of mitral stenosis.   4. Tricuspid valve regurgitation is moderate.   5. The aortic valve was not well visualized. Aortic valve regurgitation  is not visualized. No aortic stenosis is present.   6. The inferior vena cava is dilated in size with <50% respiratory  variability, suggesting right atrial pressure of 15 mmHg. ____________  2D echo 10/03/2019:  1. Left ventricular ejection fraction, by estimation, is 35 to 40%. The  left ventricle has moderately decreased function. The left ventricle  demonstrates global hypokinesis. Left ventricular diastolic parameters are  consistent with Grade II diastolic  dysfunction (pseudonormalization).   2. Right ventricular systolic function is normal. The right ventricular  size is normal.   3. The mitral valve is normal in structure. No evidence of mitral valve  regurgitation. No evidence of mitral stenosis.   4. The aortic valve is normal in structure. Aortic valve regurgitation is  not visualized. No aortic stenosis is present.   5. The inferior vena cava is normal in size with greater than 50%  respiratory variability, suggesting right atrial pressure of 3 mmHg.    Patient Profile     35 y.o. female with history of HFrEF secondary to NICM, polysubstance abuse with ongoing cocaine use poorly controlled DM2,  anemia, HLD, obesity, and GERD who we are evaluating for acute on chronic HFrEF.  Assessment & Plan    1.  Acute on chronic HFrEF secondary to NICM: -She remains significantly volume up -Net 6.9 L negative for the admission with renal function overall relatively stable -Weight down trending as above -Continue IV Lasix and metolazone  -Continue GDMT including carvedilol, losartan in place of lisinopril (last dose of ACEi on 6/13 at 0840) and spironolactone -Look to transition losartan to Entresto, if BP allows -Look to add SGLT2i prior to discharge or in follow up if BP allows -Polysubstance abuse cessation is recommended, with ongoing substance abuse she is unlikely to be a candidate for advanced heart failure modalities -CHF education -Daily weights -Strict I's and O's  2.  HTN: -Blood pressure is well controlled -Continue current medical therapy as outlined above  3.  Polysubstance abuse: -Urine drug screen positive for cocaine this admission -Complete cessation recommended, she indicates she will stop, stating "my life is more important"  4.  Hypokalemia: -Spironolactone added   5.  Hypomagnesemia: -Started on magnesium oxide  6.  DM2: -Poorly controlled with A1c 11.3 this admission -Per IM  7.  Anemia: -Hgb low though stable   For questions or updates, please contact [facility] Please consult [email or web address] for contact info under Cardiology/STEMI.    Signed, Eula Listen, PA-C [facility] Pager: [phone] 10/15/2020, 10:12 AM

## 2020-10-15 NOTE — Progress Notes (Signed)
Mobility Specialist - Progress Note   10/15/20 1400  Mobility  Activity Ambulated in hall  Level of Assistance Minimal assist, patient does 75% or more  Assistive Device Front wheel walker  Distance Ambulated (ft) 75 ft  Mobility Ambulated with assistance in hallway  Mobility Response Tolerated well  Mobility performed by Mobility specialist  $Mobility charge 1 Mobility    Pre-mobility: 92 HR, 92% SpO2 During mobility: 100 HR, 90% SpO2 Post-mobility: 96 HR, 92% SpO2   Pt sitting in recliner upon arrival, utilizing RA. Easily awakened by voice. Denied pain, nausea, fatigue. MinA and extra time to stand to RW. Ambulated in hallway with no LOB. Slow gait. Pt denies pain or cramping in LE, but does state they feel "tight" during ambulation. Several short standing breaks. Pt motivated and pleased with performance this date as she was able to ambulate increased distance.   Filiberto Pinks Mobility Specialist 10/15/20, 2:25 PM

## 2020-10-16 DIAGNOSIS — R0989 Other specified symptoms and signs involving the circulatory and respiratory systems: Secondary | ICD-10-CM

## 2020-10-16 LAB — BASIC METABOLIC PANEL
Anion gap: 8 (ref 5–15)
BUN: 22 mg/dL — ABNORMAL HIGH (ref 6–20)
CO2: 38 mmol/L — ABNORMAL HIGH (ref 22–32)
Calcium: 7.6 mg/dL — ABNORMAL LOW (ref 8.9–10.3)
Chloride: 88 mmol/L — ABNORMAL LOW (ref 98–111)
Creatinine, Ser: 0.92 mg/dL (ref 0.44–1.00)
GFR, Estimated: >60 mL/min (ref >60)
Glucose, Bld: 113 mg/dL — ABNORMAL HIGH (ref 70–99)
Potassium: 3.5 mmol/L (ref 3.5–5.1)
Sodium: 134 mmol/L — ABNORMAL LOW (ref 135–145)

## 2020-10-16 LAB — GLUCOSE, CAPILLARY
Glucose-Capillary: 108 mg/dL — ABNORMAL HIGH (ref 70–99)
Glucose-Capillary: 147 mg/dL — ABNORMAL HIGH (ref 70–99)
Glucose-Capillary: 158 mg/dL — ABNORMAL HIGH (ref 70–99)
Glucose-Capillary: 240 mg/dL — ABNORMAL HIGH (ref 70–99)

## 2020-10-16 LAB — MAGNESIUM: Magnesium: 1.5 mg/dL — ABNORMAL LOW (ref 1.7–2.4)

## 2020-10-16 MED ORDER — POTASSIUM CHLORIDE 20 MEQ PO PACK
40.0000 meq | PACK | Freq: Once | ORAL | Status: AC
  Administered 2020-10-16: 40 meq via ORAL
  Filled 2020-10-16: qty 2

## 2020-10-16 MED ORDER — POTASSIUM CHLORIDE 20 MEQ PO PACK
40.0000 meq | PACK | Freq: Every day | ORAL | Status: DC
  Administered 2020-10-17 – 2020-10-18 (×2): 40 meq via ORAL
  Filled 2020-10-16 (×2): qty 2

## 2020-10-16 MED ORDER — ENOXAPARIN SODIUM 80 MG/0.8ML IJ SOSY
0.5000 mg/kg | PREFILLED_SYRINGE | INTRAMUSCULAR | Status: DC
  Administered 2020-10-17: 65 mg via SUBCUTANEOUS
  Filled 2020-10-16: qty 0.8

## 2020-10-16 MED ORDER — MAGNESIUM SULFATE 50 % IJ SOLN
3.0000 g | Freq: Once | INTRAVENOUS | Status: AC
  Administered 2020-10-16: 3 g via INTRAVENOUS
  Filled 2020-10-16: qty 6

## 2020-10-16 NOTE — Progress Notes (Signed)
Progress Note  Patient Name: Molly Howard Date of Encounter: 10/16/2020  Primary Cardiologist: Agbor-Etang  Subjective   Dyspnea and lower extremity swelling continue to improve. No chest pain or palpitations. Abdomen remains distended. Orthopnea persists. Documented urine output 6.1 L for the past 24 hours with net -12.9 L for the admission.  Renal function overall stable. Weight trend 132.4 to 128 kg over the past 24 hours.  Drug screen positive for cocaine.   Inpatient Medications    Scheduled Meds:  carvedilol  6.25 mg Oral BID WC   enoxaparin (LOVENOX) injection  0.5 mg/kg Subcutaneous Q24H   furosemide  80 mg Intravenous Q8H   insulin aspart  0-20 Units Subcutaneous TID WC   insulin aspart  0-5 Units Subcutaneous QHS   insulin aspart  4 Units Subcutaneous TID WC   losartan  25 mg Oral Daily   magnesium oxide  400 mg Oral Daily   metolazone  5 mg Oral Daily   sodium chloride flush  3 mL Intravenous Q12H   spironolactone  12.5 mg Oral Daily   Continuous Infusions:  sodium chloride     magnesium sulfate bolus IVPB     PRN Meds: sodium chloride, acetaminophen, ondansetron (ZOFRAN) IV, sodium chloride flush   Vital Signs    Vitals:   10/15/20 1921 10/16/20 0132 10/16/20 0231 10/16/20 0807  BP: 107/68  109/81 117/82  Pulse: 92  91 89  Resp: 18  18 16   Temp: 98.9 F (37.2 C)  98 F (36.7 C) 98.1 F (36.7 C)  TempSrc:      SpO2: 100%  100% 98%  Weight:  128 kg    Height:        Intake/Output Summary (Last 24 hours) at 10/16/2020 0851 Last data filed at 10/16/2020 0834 Gross per 24 hour  Intake 360 ml  Output 6300 ml  Net -5940 ml    Filed Weights   10/14/20 0411 10/15/20 0130 10/16/20 0132  Weight: 133.9 kg 132.4 kg 128 kg    Telemetry    SR with rare PVCs - Personally Reviewed  ECG    No new tracings - Personally Reviewed  Physical Exam   GEN: No acute distress.   Neck: JVD elevated to the angle of the mandible. Cardiac: RRR, no murmurs,  rubs, or gallops.  Respiratory: Diminished breath sounds along the bilateral bases.  GI: Soft, nontender, distended.   MS: 2+ bilateral lower extremity edema with chronic woody appearance that is starting to soften; No deformity. Upper extremity swelling improved.  Neuro:  Alert and oriented x 3; Nonfocal.  Psych: Normal affect.  Labs    Chemistry Recent Labs  Lab 10/14/20 0622 10/15/20 0715 10/16/20 0441  NA 132* 135 134*  K 3.8 3.7 3.5  CL 88* 87* 88*  CO2 34* 38* 38*  GLUCOSE 183* 135* 113*  BUN 21* 21* 22*  CREATININE 1.02* 0.95 0.92  CALCIUM 7.6* 7.8* 7.6*  GFRNONAA >60 >60 >60  ANIONGAP 10 10 8       Hematology Recent Labs  Lab 10/10/20 1312 10/15/20 0715  WBC 5.1 4.2  RBC 3.77* 4.02  HGB 9.6* 10.3*  HCT 31.9* 33.3*  MCV 84.6 82.8  MCH 25.5* 25.6*  MCHC 30.1 30.9  RDW 17.1* 17.5*  PLT 407* 482*     Cardiac EnzymesNo results for input(s): TROPONINI in the last 168 hours. No results for input(s): TROPIPOC in the last 168 hours.   BNP Recent Labs  Lab 10/10/20 1312 10/14/20  0622  BNP 841.3* 713.0*      DDimer No results for input(s): DDIMER in the last 168 hours.   Radiology    No results found.  Cardiac Studies   2D echo 10/11/2020: 1. Left ventricular ejection fraction, by estimation, is 30 to 35%. The  left ventricle has moderate to severely decreased function. The left  ventricle demonstrates global hypokinesis. Left ventricular diastolic  parameters are consistent with Grade II  diastolic dysfunction (pseudonormalization).   2. Right ventricular systolic function is mildly reduced. The right  ventricular size is normal.   3. A small pericardial effusion is present.   4. The mitral valve is normal in structure. No evidence of mitral valve  regurgitation.   5. Tricuspid valve regurgitation is moderate.   6. The aortic valve is tricuspid. Aortic valve regurgitation is not  visualized.  __________  2D echo 05/20/2020: 1. Left  ventricular ejection fraction, by estimation, is 20 to 25%. The  left ventricle has severely decreased function. The left ventricle  demonstrates global hypokinesis. The left ventricular internal cavity size  was mildly dilated. Indeterminate  diastolic filling due to E-A fusion.   2. Right ventricular systolic function is normal. The right ventricular  size is mildly enlarged. There is severely elevated pulmonary artery  systolic pressure.   3. The mitral valve is normal in structure. Trivial mitral valve  regurgitation. No evidence of mitral stenosis.   4. Tricuspid valve regurgitation is moderate.   5. The aortic valve was not well visualized. Aortic valve regurgitation  is not visualized. No aortic stenosis is present.   6. The inferior vena cava is dilated in size with <50% respiratory  variability, suggesting right atrial pressure of 15 mmHg. ____________  2D echo 10/03/2019:  1. Left ventricular ejection fraction, by estimation, is 35 to 40%. The  left ventricle has moderately decreased function. The left ventricle  demonstrates global hypokinesis. Left ventricular diastolic parameters are  consistent with Grade II diastolic  dysfunction (pseudonormalization).   2. Right ventricular systolic function is normal. The right ventricular  size is normal.   3. The mitral valve is normal in structure. No evidence of mitral valve  regurgitation. No evidence of mitral stenosis.   4. The aortic valve is normal in structure. Aortic valve regurgitation is  not visualized. No aortic stenosis is present.   5. The inferior vena cava is normal in size with greater than 50%  respiratory variability, suggesting right atrial pressure of 3 mmHg.    Patient Profile     35 y.o. female with history of HFrEF secondary to NICM, polysubstance abuse with ongoing cocaine use poorly controlled DM2, anemia, HLD, obesity, and GERD who we are evaluating for acute on chronic HFrEF.  Assessment & Plan     1.  Acute on chronic HFrEF secondary to NICM: -She remains significantly volume up -Net 12.9 L negative for the admission with renal function overall relatively stable -Weight down trending as above -Continue IV Lasix and metolazone  -Continue GDMT including carvedilol, losartan in place of lisinopril (last dose of ACEi on 6/13 at 0840) and spironolactone -Look to transition losartan to Entresto, if BP allows -Look to add SGLT2i prior to discharge or in follow up if BP allows -Polysubstance abuse cessation is recommended, with ongoing substance abuse she is unlikely to be a candidate for advanced heart failure modalities -CHF education -Daily weights -Strict I's and O's  2.  HTN: -Blood pressure is well controlled -Continue current medical therapy as  outlined above  3.  Polysubstance abuse: -Urine drug screen positive for cocaine this admission -Complete cessation recommended, she indicates she will stop, stating "my life is more important"  4.  Hypokalemia: -Spironolactone added  -KCl already ordered   5.  Hypomagnesemia: -Magnesium sulfate already ordered   6.  DM2: -Poorly controlled with A1c 11.3 this admission -Per IM  7.  Anemia: -Hgb low though stable   For questions or updates, please contact [facility] Please consult [email or web address] for contact info under Cardiology/STEMI.    Signed, Eula Listen, PA-C [facility] Pager: [phone] 10/16/2020, 8:51 AM

## 2020-10-16 NOTE — Progress Notes (Signed)
PROGRESS NOTE    Molly Howard  WNU:[id number] DOB: 19-Dec-1985 DOA: 10/10/2020 PCP: Patient, No Pcp Per (Inactive)   Chief complaint.  Shortness of breath. Brief Narrative:  Molly Howard is a 35 y.o. 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  She stated that a month ago her doctor cut her torsemide dose in half due to concern for her kidney function and since then she started retaining fluid. Admitted for CHF. Non compliant with meds. Cocaine positive. On lasix iv. Cardiology on board.   Assessment & Plan:   Principal Problem:   Acute on chronic combined systolic and diastolic heart failure (HCC) Active Problems:   NICM (nonischemic cardiomyopathy) (HCC)   Essential hypertension   Chronic anemia   Anasarca   Hyperglycemia due to type 2 diabetes mellitus (HCC)  #1.  Acute on chronic combined systolic and diastolic congestive heart failure. Nonischemic cardiomyopathy. Anasarca. Pericardial effusion on chest x-ray. Patient condition has been improving, anasarca significantly improved.  Continue diuretics.  Anticipating discharge in 1 to 2 days.  #2.  Uncontrolled type 2 diabetes with hyperglycemia Continue current regimen.  #3.  Cocaine abuse.  #4.  Hypomagnesemia Supplement.  Also give a dose of oral potassium for K of 3.5    DVT prophylaxis: Lovenox Code Status: full Family Communication:  Disposition Plan:    Status is: Inpatient  Remains inpatient appropriate because:Inpatient level of care appropriate due to severity of illness  Dispo: The patient is from: Home              Anticipated d/c is to: Home              Patient currently is not medically stable to d/c.   Difficult to place patient No        I/O last 3 completed  shifts: In: 360 [P.O.:360] Out: 6800 [Urine:6800] Total I/O In: 600 [P.O.:600] Out: 800 [Urine:800]     Consultants:  card  Procedures: none  Antimicrobials: none   Subjective: Patient feels much better today.  She is more mobile, to walk better due to less edema. Short of breath has improved. No abdominal pain or nausea vomiting No dysuria hematuria. No fever or chills.  Objective: Vitals:   10/16/20 0132 10/16/20 0231 10/16/20 0807 10/16/20 1208  BP:  109/81 117/82 (!) 117/92  Pulse:  91 89 90  Resp:  18 16 18   Temp:  98 F (36.7 C) 98.1 F (36.7 C) 98 F (36.7 C)  TempSrc:      SpO2:  100% 98% 99%  Weight: 128 kg     Height:        Intake/Output Summary (Last 24 hours) at 10/16/2020 1231 Last data filed at 10/16/2020 1100 Gross per 24 hour  Intake 840 ml  Output 5700 ml  Net -4860 ml   Filed Weights   10/14/20 0411 10/15/20 0130 10/16/20 0132  Weight: 133.9 kg 132.4 kg 128 kg    Examination:  General exam: Appears calm and comfortable  Respiratory system: Clear to auscultation. Respiratory effort normal. Cardiovascular system: S1 & S2 heard, RRR. No JVD, murmurs, rubs, gallops or clicks. Gastrointestinal system: Abdomen is nondistended, soft and nontender. No organomegaly or masses felt. Normal bowel sounds heard. Central nervous system: Alert and oriented. No focal neurological  deficits. Extremities: 2-3+ leg edema Skin: No rashes, lesions or ulcers Psychiatry: Judgement and insight appear normal. Mood & affect appropriate.     Data Reviewed: I have personally reviewed following labs and imaging studies  CBC: Recent Labs  Lab 10/10/20 1312 10/15/20 0715  WBC 5.1 4.2  HGB 9.6* 10.3*  HCT 31.9* 33.3*  MCV 84.6 82.8  PLT 407* 482*   Basic Metabolic Panel: Recent Labs  Lab 10/12/20 0518 10/13/20 0551 10/14/20 0622 10/15/20 0715 10/16/20 0441  NA 135 132* 132* 135 134*  K 3.3* 3.7 3.8 3.7 3.5  CL 92* 90* 88* 87* 88*  CO2 36* 36*  34* 38* 38*  GLUCOSE 117* 196* 183* 135* 113*  BUN 19 20 21* 21* 22*  CREATININE 0.94 1.01* 1.02* 0.95 0.92  CALCIUM 7.3* 7.5* 7.6* 7.8* 7.6*  MG 1.6* 1.7  --   --  1.5*   GFR: Estimated Creatinine Clearance: 117.7 mL/min (by C-G formula based on SCr of 0.92 mg/dL). Liver Function Tests: No results for input(s): AST, ALT, ALKPHOS, BILITOT, PROT, ALBUMIN in the last 168 hours. No results for input(s): LIPASE, AMYLASE in the last 168 hours. No results for input(s): AMMONIA in the last 168 hours. Coagulation Profile: No results for input(s): INR, PROTIME in the last 168 hours. Cardiac Enzymes: No results for input(s): CKTOTAL, CKMB, CKMBINDEX, TROPONINI in the last 168 hours. BNP (last 3 results) No results for input(s): PROBNP in the last 8760 hours. HbA1C: No results for input(s): HGBA1C in the last 72 hours. CBG: Recent Labs  Lab 10/15/20 1153 10/15/20 1625 10/15/20 1951 10/16/20 0809 10/16/20 1213  GLUCAP 167* 175* 221* 108* 147*   Lipid Profile: No results for input(s): CHOL, HDL, LDLCALC, TRIG, CHOLHDL, LDLDIRECT in the last 72 hours. Thyroid Function Tests: No results for input(s): TSH, T4TOTAL, FREET4, T3FREE, THYROIDAB in the last 72 hours. Anemia Panel: No results for input(s): VITAMINB12, FOLATE, FERRITIN, TIBC, IRON, RETICCTPCT in the last 72 hours. Sepsis Labs: No results for input(s): PROCALCITON, LATICACIDVEN in the last 168 hours.  Recent Results (from the past 240 hour(s))  Resp Panel by RT-PCR (Flu A&B, Covid) Nasopharyngeal Swab     Status: None   Collection Time: 10/11/20 12:29 AM   Specimen: Nasopharyngeal Swab; Nasopharyngeal(NP) swabs in vial transport medium  Result Value Ref Range Status   SARS Coronavirus 2 by RT PCR NEGATIVE NEGATIVE Final    Comment: (NOTE) SARS-CoV-2 target nucleic acids are NOT DETECTED.  The SARS-CoV-2 RNA is generally detectable in upper respiratory specimens during the acute phase of infection. The lowest concentration  of SARS-CoV-2 viral copies this assay can detect is 138 copies/mL. A negative result does not preclude SARS-Cov-2 infection and should not be used as the sole basis for treatment or other patient management decisions. A negative result may occur with  improper specimen collection/handling, submission of specimen other than nasopharyngeal swab, presence of viral mutation(s) within the areas targeted by this assay, and inadequate number of viral copies(<138 copies/mL). A negative result must be combined with clinical observations, patient history, and epidemiological information. The expected result is Negative.  Fact Sheet for Patients:  BloggerCourse.com  Fact Sheet for Healthcare Providers:  SeriousBroker.it  This test is no t yet approved or cleared by the [place] FDA and  has been authorized for detection and/or diagnosis of SARS-CoV-2 by FDA under an Emergency Use Authorization (EUA). This EUA will remain  in effect (meaning this test can be used) for the duration of the COVID-19 declaration  under Section 564(b)(1) of the Act, 21 U.S.C.section 360bbb-3(b)(1), unless the authorization is terminated  or revoked sooner.       Influenza A by PCR NEGATIVE NEGATIVE Final   Influenza B by PCR NEGATIVE NEGATIVE Final    Comment: (NOTE) The Xpert Xpress SARS-CoV-2/FLU/RSV plus assay is intended as an aid in the diagnosis of influenza from Nasopharyngeal swab specimens and should not be used as a sole basis for treatment. Nasal washings and aspirates are unacceptable for Xpert Xpress SARS-CoV-2/FLU/RSV testing.  Fact Sheet for Patients: BloggerCourse.com  Fact Sheet for Healthcare Providers: SeriousBroker.it  This test is not yet approved or cleared by the [place] FDA and has been authorized for detection and/or diagnosis of SARS-CoV-2 by FDA under an Emergency Use  Authorization (EUA). This EUA will remain in effect (meaning this test can be used) for the duration of the COVID-19 declaration under Section 564(b)(1) of the Act, 21 U.S.C. section 360bbb-3(b)(1), unless the authorization is terminated or revoked.  Performed at [facility], [address] [place], [place] [address]          Radiology Studies: No results found.      Scheduled Meds:  carvedilol  6.25 mg Oral BID WC   enoxaparin (LOVENOX) injection  0.5 mg/kg Subcutaneous Q24H   furosemide  80 mg Intravenous Q8H   insulin aspart  0-20 Units Subcutaneous TID WC   insulin aspart  0-5 Units Subcutaneous QHS   insulin aspart  4 Units Subcutaneous TID WC   losartan  25 mg Oral Daily   magnesium oxide  400 mg Oral Daily   metolazone  5 mg Oral Daily   sodium chloride flush  3 mL Intravenous Q12H   spironolactone  12.5 mg Oral Daily   Continuous Infusions:  sodium chloride     magnesium sulfate bolus IVPB       LOS: 5 days    Time spent: 26 minutes    Marrion Coy, MD [facility]   To contact the attending provider between 7A-7P or the covering provider during after hours 7P-7A, please log into the web site [email or web address] and access using universal [place] password for that web site. If you do not have the password, please call the hospital operator.  10/16/2020, 12:31 PM

## 2020-10-16 NOTE — Progress Notes (Signed)
   Heart Failure Nurse Navigator Note  Met with patient, again by teach back method went over diet, sodium restriction, weighing daily and what to report to physician.  She needs very little reinforcement.  She has reviewed all of heart failure videos.  At this time her medications include losartan, Coreg, spironolactone and Lasix.  Per cardiology's note they plan to transition from losartan to [facility] and may add an SGLT2i prior to discharge.  Have spent greater than an hour with heart failure education.   Pricilla Riffle RN [facility]

## 2020-10-17 ENCOUNTER — Ambulatory Visit: Payer: Self-pay | Admitting: Cardiology

## 2020-10-17 DIAGNOSIS — D75838 Other thrombocytosis: Secondary | ICD-10-CM

## 2020-10-17 LAB — BASIC METABOLIC PANEL
Anion gap: 9 (ref 5–15)
BUN: 23 mg/dL — ABNORMAL HIGH (ref 6–20)
CO2: 38 mmol/L — ABNORMAL HIGH (ref 22–32)
Calcium: 7.7 mg/dL — ABNORMAL LOW (ref 8.9–10.3)
Chloride: 88 mmol/L — ABNORMAL LOW (ref 98–111)
Creatinine, Ser: 0.94 mg/dL (ref 0.44–1.00)
GFR, Estimated: >60 mL/min (ref >60)
Glucose, Bld: 203 mg/dL — ABNORMAL HIGH (ref 70–99)
Potassium: 3.6 mmol/L (ref 3.5–5.1)
Sodium: 135 mmol/L (ref 135–145)

## 2020-10-17 LAB — GLUCOSE, CAPILLARY
Glucose-Capillary: 219 mg/dL — ABNORMAL HIGH (ref 70–99)
Glucose-Capillary: 202 mg/dL — ABNORMAL HIGH (ref 70–99)
Glucose-Capillary: 181 mg/dL — ABNORMAL HIGH (ref 70–99)
Glucose-Capillary: 214 mg/dL — ABNORMAL HIGH (ref 70–99)

## 2020-10-17 LAB — MAGNESIUM: Magnesium: 1.7 mg/dL (ref 1.7–2.4)

## 2020-10-17 MED ORDER — MAGNESIUM SULFATE 2 GM/50ML IV SOLN
2.0000 g | Freq: Once | INTRAVENOUS | Status: AC
  Administered 2020-10-17: 2 g via INTRAVENOUS
  Filled 2020-10-17: qty 50

## 2020-10-17 MED ORDER — ENOXAPARIN SODIUM 60 MG/0.6ML IJ SOSY
60.0000 mg | PREFILLED_SYRINGE | INTRAMUSCULAR | Status: DC
  Administered 2020-10-18: 60 mg via SUBCUTANEOUS
  Filled 2020-10-17: qty 0.6

## 2020-10-17 NOTE — Progress Notes (Signed)
Physical Therapy Treatment Patient Details Name: Molly Howard MRN: [id number] DOB: 04/10/1986 Today's Date: 10/17/2020    History of Present Illness Pt is a 35 y.o. 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  She states that a month ago her doctor cut her torsemide dose in half due to concern for her kidney function and since then she started retaining fluid. MD assessment includes: Acute on chronic combined systolic and diastolic heart failure, uncontrolled DM, anasarca, nonischemic cardiomyopathy, and cocaine abuse.    PT Comments    Pt was pleasant and motivated to participate during the session and made very good progress towards goals.  Pt found in bright spirits stating that she feels like she is close to back to baseline functionally and has been walking to the BR without nursing or the use of an AD. Pt was able to amb 250+ feet during the session without an AD with wide BOS but steady without LOB and with SpO2 and HR WNL.  Pt was steady ascending and descending steps with reciprocal pattern and again with no adverse symptoms.  Will keep pt on acute care PT caseload to prevent deconditioning but pt will not require further skilled PT services upon discharge.      Follow Up Recommendations  No PT follow up     Equipment Recommendations  Rolling walker with 5" wheels (bariatric)    Recommendations for Other Services       Precautions / Restrictions Precautions Precautions: Fall Restrictions Weight Bearing Restrictions: No    Mobility  Bed Mobility               General bed mobility comments: NT, pt in recliner    Transfers Overall transfer level: Modified independent               General transfer comment: Good eccentric and  concentric control and stability  Ambulation/Gait Ambulation/Gait assistance: Independent Gait Distance (Feet): 250 Feet Assistive device: None Gait Pattern/deviations: Wide base of support;Step-through pattern;Decreased step length - right;Decreased step length - left Gait velocity: decreased   General Gait Details: Slow cadence but steady without LOB without the need of an AD   Stairs Stairs: Yes Stairs assistance: Supervision Stair Management: Two rails;Alternating pattern;Forwards;Backwards Number of Stairs: 3 General stair comments: Pt steady with ascending steps forwards and descending backwards with two rails   Wheelchair Mobility    Modified Rankin (Stroke Patients Only)       Balance Overall balance assessment: Needs assistance   Sitting balance-Leahy Scale: Normal     Standing balance support: No upper extremity supported;During functional activity Standing balance-Leahy Scale: Good                              Cognition Arousal/Alertness: Awake/alert Behavior During Therapy: WFL for tasks assessed/performed Overall Cognitive Status: Within Functional Limits for tasks assessed                                        Exercises Total Joint Exercises Ankle Circles/Pumps: AROM;Strengthening;Both;10 reps Quad Sets: Strengthening;Both;10 reps Gluteal Sets: Strengthening;Both;10 reps Long Arc Quad: AROM;Strengthening;Both;10 reps Knee Flexion: 10 reps;Both;Strengthening;AROM Other Exercises  Other Exercises: Pt education on HEP for BLE APs, QS, GS, and LAQs x 10 each every 1-2 hours daily    General Comments        Pertinent Vitals/Pain Pain Assessment: No/denies pain    Home Living                      Prior Function            PT Goals (current goals can now be found in the care plan section) Progress towards PT goals: Progressing toward goals    Frequency    Min 2X/week      PT Plan Discharge plan  needs to be updated    Co-evaluation              AM-PAC PT "6 Clicks" Mobility   Outcome Measure  Help needed turning from your back to your side while in a flat bed without using bedrails?: None Help needed moving from lying on your back to sitting on the side of a flat bed without using bedrails?: None Help needed moving to and from a bed to a chair (including a wheelchair)?: None Help needed standing up from a chair using your arms (e.g., wheelchair or bedside chair)?: None Help needed to walk in hospital room?: None Help needed climbing 3-5 steps with a railing? : A Little 6 Click Score: 23    End of Session Equipment Utilized During Treatment: Gait belt Activity Tolerance: Patient tolerated treatment well Patient left: in chair;with call bell/phone within reach Nurse Communication: Mobility status PT Visit Diagnosis: Muscle weakness (generalized) (M62.81);Difficulty in walking, not elsewhere classified (R26.2);Unsteadiness on feet (R26.81)     Time: 6967-8938 PT Time Calculation (min) (ACUTE ONLY): 26 min  Charges:  $Gait Training: 23-37 mins                     D. Scott Derrich Gaby PT, DPT 10/17/20, 5:08 PM

## 2020-10-17 NOTE — Progress Notes (Signed)
   Heart Failure Nurse Navigator Note  Met with patient today, she is sitting up in the chair at bedside.  She feels she is getting closer to her baseline.  Hoping to go home soon.  Discussed by teach back method how she is going to take care of herself to keep her out of the hospital.  Also discussed her fluid restriction, as she went over it a little yesterday.  Also discussed medications/she was given a printed list of  what she is on and also the ones that might be added to her regime.  Pricilla Riffle RN CHFN

## 2020-10-17 NOTE — Progress Notes (Signed)
   10/17/20 1045  Clinical Encounter Type  Visited With Patient  Visit Type Initial;Spiritual support;Social support  Referral From  (rounding)  Spiritual Encounters  Spiritual Needs Emotional;Other (Comment) (Support; encouragement)  Daryel November met Ms. Mcclenny for first time. PT was sitting up and listening to some upbeat music. Chaplain Burris encouraged this use of music and shared her wishes for PT to have her spirits lifted in this way.Chaplain Burris provided supportive presence, encouragement, and offered ongoing pastoral support as needed.

## 2020-10-17 NOTE — Progress Notes (Signed)
Progress Note  Patient Name: Molly Howard Date of Encounter: 10/17/2020  [facility] Cardiologist: Debbe Odea, MD   Subjective   Reports improving arm edema, abdominal distention Still massively fluid overloaded, Reports weight was 270 pounds Baseline weight 230 pounds -15.8 L so far   Inpatient Medications    Scheduled Meds:  carvedilol  6.25 mg Oral BID WC   [START ON 10/18/2020] enoxaparin (LOVENOX) injection  60 mg Subcutaneous Q24H   furosemide  80 mg Intravenous Q8H   insulin aspart  0-20 Units Subcutaneous TID WC   insulin aspart  0-5 Units Subcutaneous QHS   insulin aspart  4 Units Subcutaneous TID WC   losartan  25 mg Oral Daily   magnesium oxide  400 mg Oral Daily   metolazone  5 mg Oral Daily   potassium chloride  40 mEq Oral Daily   sodium chloride flush  3 mL Intravenous Q12H   spironolactone  12.5 mg Oral Daily   Continuous Infusions:  sodium chloride     magnesium sulfate bolus IVPB 2 g (10/17/20 1156)   PRN Meds: sodium chloride, acetaminophen, ondansetron (ZOFRAN) IV, sodium chloride flush   Vital Signs    Vitals:   10/17/20 0430 10/17/20 0552 10/17/20 0746 10/17/20 1100  BP:   117/83 120/80  Pulse:   89 92  Resp:   18 17  Temp: 97.6 F (36.4 C)  98.2 F (36.8 C) 98 F (36.7 C)  TempSrc:    Oral  SpO2:   96% 94%  Weight:  123.8 kg    Height:        Intake/Output Summary (Last 24 hours) at 10/17/2020 1241 Last data filed at 10/17/2020 1009 Gross per 24 hour  Intake 1786 ml  Output 4650 ml  Net -2864 ml   Last 3 Weights 10/17/2020 10/16/2020 10/15/2020  Weight (lbs) 273 lb 282 lb 3.2 oz 291 lb 12.8 oz  Weight (kg) 123.832 kg 128.005 kg 132.36 kg      Telemetry    Normal sinus rhythm- Personally Reviewed  ECG     - Personally Reviewed  Physical Exam   GEN: No acute distress.   Neck: No JVD Cardiac: RRR, no murmurs, rubs, or gallops.  Respiratory: Clear to auscultation bilaterally. GI: Soft, nontender,  non-distended  MS: No edema; No deformity. Neuro:  Nonfocal  Psych: Normal affect   Labs    High Sensitivity Troponin:   Recent Labs  Lab 10/10/20 1312 10/10/20 2252  TROPONINIHS 14 15      Chemistry Recent Labs  Lab 10/15/20 0715 10/16/20 0441 10/17/20 0508  NA 135 134* 135  K 3.7 3.5 3.6  CL 87* 88* 88*  CO2 38* 38* 38*  GLUCOSE 135* 113* 203*  BUN 21* 22* 23*  CREATININE 0.95 0.92 0.94  CALCIUM 7.8* 7.6* 7.7*  GFRNONAA >60 >60 >60  ANIONGAP 10 8 9      Hematology Recent Labs  Lab 10/10/20 1312 10/15/20 0715  WBC 5.1 4.2  RBC 3.77* 4.02  HGB 9.6* 10.3*  HCT 31.9* 33.3*  MCV 84.6 82.8  MCH 25.5* 25.6*  MCHC 30.1 30.9  RDW 17.1* 17.5*  PLT 407* 482*    BNP Recent Labs  Lab 10/10/20 1312 10/14/20 0622  BNP 841.3* 713.0*     DDimer No results for input(s): DDIMER in the last 168 hours.   Radiology    No results found.  Cardiac Studies     Patient Profile     35 y.o. female  with history of HFrEF secondary to NICM, polysubstance abuse with ongoing cocaine use poorly controlled DM2, anemia, HLD, obesity, and GERD who we are evaluating for acute on chronic HFrEF.  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

## 2020-10-17 NOTE — Plan of Care (Signed)
  Problem: Education: Goal: Knowledge of General Education information will improve Description: Including pain rating scale, medication(s)/side effects and non-pharmacologic comfort measures 10/17/2020 1059 by Ansel Bong, RN Outcome: Progressing 10/17/2020 1059 by Ansel Bong, RN Outcome: Progressing   Problem: Health Behavior/Discharge Planning: Goal: Ability to manage health-related needs will improve 10/17/2020 1059 by Ansel Bong, RN Outcome: Progressing 10/17/2020 1059 by Ansel Bong, RN Outcome: Progressing   Problem: Clinical Measurements: Goal: Ability to maintain clinical measurements within normal limits will improve 10/17/2020 1059 by Ansel Bong, RN Outcome: Progressing 10/17/2020 1059 by Ansel Bong, RN Outcome: Progressing Goal: Will remain free from infection 10/17/2020 1059 by Ansel Bong, RN Outcome: Progressing 10/17/2020 1059 by Ansel Bong, RN Outcome: Progressing Goal: Diagnostic test results will improve 10/17/2020 1059 by Ansel Bong, RN Outcome: Progressing 10/17/2020 1059 by Ansel Bong, RN Outcome: Progressing Goal: Respiratory complications will improve 10/17/2020 1059 by Ansel Bong, RN Outcome: Progressing 10/17/2020 1059 by Ansel Bong, RN Outcome: Progressing Goal: Cardiovascular complication will be avoided 10/17/2020 1059 by Ansel Bong, RN Outcome: Progressing 10/17/2020 1059 by Ansel Bong, RN Outcome: Progressing   Problem: Activity: Goal: Risk for activity intolerance will decrease 10/17/2020 1059 by Ansel Bong, RN Outcome: Progressing 10/17/2020 1059 by Ansel Bong, RN Outcome: Progressing   Problem: Nutrition: Goal: Adequate nutrition will be maintained 10/17/2020 1059 by Ansel Bong, RN Outcome: Progressing 10/17/2020 1059 by Ansel Bong, RN Outcome: Progressing   Problem: Coping: Goal: Level of anxiety will decrease 10/17/2020 1059 by Ansel Bong, RN Outcome:  Progressing 10/17/2020 1059 by Ansel Bong, RN Outcome: Progressing   Problem: Elimination: Goal: Will not experience complications related to bowel motility 10/17/2020 1059 by Ansel Bong, RN Outcome: Progressing 10/17/2020 1059 by Ansel Bong, RN Outcome: Progressing Goal: Will not experience complications related to urinary retention 10/17/2020 1059 by Ansel Bong, RN Outcome: Progressing 10/17/2020 1059 by Ansel Bong, RN Outcome: Progressing   Problem: Pain Managment: Goal: General experience of comfort will improve 10/17/2020 1059 by Ansel Bong, RN Outcome: Progressing 10/17/2020 1059 by Ansel Bong, RN Outcome: Progressing   Problem: Safety: Goal: Ability to remain free from injury will improve 10/17/2020 1059 by Ansel Bong, RN Outcome: Progressing 10/17/2020 1059 by Ansel Bong, RN Outcome: Progressing   Problem: Skin Integrity: Goal: Risk for impaired skin integrity will decrease 10/17/2020 1059 by Ansel Bong, RN Outcome: Progressing 10/17/2020 1059 by Ansel Bong, RN Outcome: Progressing   Problem: Education: Goal: Ability to demonstrate management of disease process will improve 10/17/2020 1059 by Ansel Bong, RN Outcome: Progressing 10/17/2020 1059 by Ansel Bong, RN Outcome: Progressing Goal: Ability to verbalize understanding of medication therapies will improve 10/17/2020 1059 by Ansel Bong, RN Outcome: Progressing 10/17/2020 1059 by Ansel Bong, RN Outcome: Progressing Goal: Individualized Educational Video(s) 10/17/2020 1059 by Ansel Bong, RN Outcome: Progressing 10/17/2020 1059 by Ansel Bong, RN Outcome: Progressing   Problem: Activity: Goal: Capacity to carry out activities will improve 10/17/2020 1059 by Ansel Bong, RN Outcome: Progressing 10/17/2020 1059 by Ansel Bong, RN Outcome: Progressing   Problem: Cardiac: Goal: Ability to achieve and maintain adequate cardiopulmonary perfusion will  improve 10/17/2020 1059 by Ansel Bong, RN Outcome: Progressing 10/17/2020 1059 by Ansel Bong, RN Outcome: Progressing

## 2020-10-17 NOTE — Progress Notes (Signed)
Inpatient Diabetes Program Recommendations  AACE/ADA: New Consensus Statement on Inpatient Glycemic Control (2015)  Target Ranges:  Prepandial:   less than 140 mg/dL      Peak postprandial:   less than 180 mg/dL (1-2 hours)      Critically ill patients:  140 - 180 mg/dL   Results for Molly Howard, Molly Howard (MRN [id number]) as of 10/17/2020 14:12  Ref. Range 10/17/2020 07:47 10/17/2020 12:30  Glucose-Capillary Latest Ref Range: 70 - 99 mg/dL 048 (H)  11 units NOVOLOG  202 (H)  11 units NOVOLOG      Home DM Meds: 70/30 Insulin 10 units BID   Current Orders: Novolog Resistant Correction Scale/ SSI (0-20 units) TID AC + HS      Novolog 4 units TID with meals    MD- Note CBGs >200 today.  Per Home Med Rec, takes 70/30 Insulin BID at home.  Please consider restarting 50% home dose Insulin:  70/30 Insulin 5 units BID with meals     --Will follow patient during hospitalization--  Ambrose Finland RN, MSN, CDE Diabetes Coordinator Inpatient Glycemic Control Team Team Pager: [phone] (8a-5p)

## 2020-10-17 NOTE — Progress Notes (Signed)
PROGRESS NOTE    Molly Howard  CLE:[id number] DOB: Sep 07, 1985 DOA: 10/10/2020 PCP: Patient, No Pcp Per (Inactive)   Chief complaint.  Shortness of breath. Brief Narrative:  Molly Howard is a 35 y.o. 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  She stated that a month ago her doctor cut her torsemide dose in half due to concern for her kidney function and since then she started retaining fluid. Admitted for CHF. Non compliant with meds. Cocaine positive. On lasix iv. Cardiology on board.     Assessment & Plan:   Principal Problem:   Acute on chronic combined systolic and diastolic heart failure (HCC) Active Problems:   NICM (nonischemic cardiomyopathy) (HCC)   Essential hypertension   Chronic anemia   Anasarca   Hyperglycemia due to type 2 diabetes mellitus (HCC)  #1.  Acute on chronic combined systolic and diastolic congestive heart failure. Nonischemic cardiomyopathy. Anasarca. Pericardial effusion on chest x-ray. Patient continue diuresing well.  Anasarca improving.  2.  Uncontrolled type 2 diabetes with hyperglycemia. Continue current regimen, glucose seem to be better.  3.  Cocaine abuse.    DVT prophylaxis: Lovenox Code Status: Full Family Communication:  Disposition Plan:    Status is: Inpatient  Remains inpatient appropriate because:Inpatient level of care appropriate due to severity of illness  Dispo: The patient is from: Home              Anticipated d/c is to: Home              Patient currently is not medically stable to d/c.   Difficult to place patient No        I/O last 3 completed shifts: In: 2266 [P.O.:2160; IV Piggyback:106] Out: 9250 [Urine:9250] Total I/O In: 120 [P.O.:120] Out: 1000 [Urine:1000]      Consultants:  cardiology  Procedures: None  Antimicrobials: None  Subjective: Patient continue to make a large amount of urine, short of breath much improved. She has a better appetite without nausea vomiting No fever chills No dysuria hematuria.  Objective: Vitals:   10/17/20 0430 10/17/20 0552 10/17/20 0746 10/17/20 1100  BP:   117/83 120/80  Pulse:   89 92  Resp:   18 17  Temp: 97.6 F (36.4 C)  98.2 F (36.8 C) 98 F (36.7 C)  TempSrc:    Oral  SpO2:   96% 94%  Weight:  123.8 kg    Height:        Intake/Output Summary (Last 24 hours) at 10/17/2020 1258 Last data filed at 10/17/2020 1009 Gross per 24 hour  Intake 1786 ml  Output 4650 ml  Net -2864 ml   Filed Weights   10/15/20 0130 10/16/20 0132 10/17/20 0552  Weight: 132.4 kg 128 kg 123.8 kg    Examination:  General exam: Appears calm and comfortable  Respiratory system: Clear to auscultation. Respiratory effort normal. Cardiovascular system: S1 & S2 heard, RRR. No JVD, murmurs, rubs, gallops or clicks.  Gastrointestinal system: Abdomen is nondistended, soft and nontender. No organomegaly or masses felt. Normal bowel sounds heard. Central nervous system: Alert and oriented. No focal neurological deficits. Extremities:, 3+ leg edema. Skin: No rashes, lesions or ulcers Psychiatry: Judgement and insight appear normal. Mood & affect appropriate.     Data Reviewed: I  have personally reviewed following labs and imaging studies  CBC: Recent Labs  Lab 10/10/20 1312 10/15/20 0715  WBC 5.1 4.2  HGB 9.6* 10.3*  HCT 31.9* 33.3*  MCV 84.6 82.8  PLT 407* 482*   Basic Metabolic Panel: Recent Labs  Lab 10/12/20 0518 10/13/20 0551 10/14/20 0622 10/15/20 0715 10/16/20 0441 10/17/20 0508  NA 135 132* 132* 135 134* 135  K 3.3* 3.7 3.8 3.7 3.5 3.6  CL 92* 90* 88* 87* 88* 88*  CO2 36* 36* 34* 38* 38* 38*  GLUCOSE 117* 196* 183* 135* 113* 203*  BUN 19 20 21* 21* 22* 23*  CREATININE 0.94 1.01* 1.02*  0.95 0.92 0.94  CALCIUM 7.3* 7.5* 7.6* 7.8* 7.6* 7.7*  MG 1.6* 1.7  --   --  1.5* 1.7   GFR: Estimated Creatinine Clearance: 113 mL/min (by C-G formula based on SCr of 0.94 mg/dL). Liver Function Tests: No results for input(s): AST, ALT, ALKPHOS, BILITOT, PROT, ALBUMIN in the last 168 hours. No results for input(s): LIPASE, AMYLASE in the last 168 hours. No results for input(s): AMMONIA in the last 168 hours. Coagulation Profile: No results for input(s): INR, PROTIME in the last 168 hours. Cardiac Enzymes: No results for input(s): CKTOTAL, CKMB, CKMBINDEX, TROPONINI in the last 168 hours. BNP (last 3 results) No results for input(s): PROBNP in the last 8760 hours. HbA1C: No results for input(s): HGBA1C in the last 72 hours. CBG: Recent Labs  Lab 10/16/20 1213 10/16/20 1630 10/16/20 2005 10/17/20 0747 10/17/20 1230  GLUCAP 147* 158* 240* 219* 202*   Lipid Profile: No results for input(s): CHOL, HDL, LDLCALC, TRIG, CHOLHDL, LDLDIRECT in the last 72 hours. Thyroid Function Tests: No results for input(s): TSH, T4TOTAL, FREET4, T3FREE, THYROIDAB in the last 72 hours. Anemia Panel: No results for input(s): VITAMINB12, FOLATE, FERRITIN, TIBC, IRON, RETICCTPCT in the last 72 hours. Sepsis Labs: No results for input(s): PROCALCITON, LATICACIDVEN in the last 168 hours.  Recent Results (from the past 240 hour(s))  Resp Panel by RT-PCR (Flu A&B, Covid) Nasopharyngeal Swab     Status: None   Collection Time: 10/11/20 12:29 AM   Specimen: Nasopharyngeal Swab; Nasopharyngeal(NP) swabs in vial transport medium  Result Value Ref Range Status   SARS Coronavirus 2 by RT PCR NEGATIVE NEGATIVE Final    Comment: (NOTE) SARS-CoV-2 target nucleic acids are NOT DETECTED.  The SARS-CoV-2 RNA is generally detectable in upper respiratory specimens during the acute phase of infection. The lowest concentration of SARS-CoV-2 viral copies this assay can detect is 138 copies/mL. A negative result  does not preclude SARS-Cov-2 infection and should not be used as the sole basis for treatment or other patient management decisions. A negative result may occur with  improper specimen collection/handling, submission of specimen other than nasopharyngeal swab, presence of viral mutation(s) within the areas targeted by this assay, and inadequate number of viral copies(<138 copies/mL). A negative result must be combined with clinical observations, patient history, and epidemiological information. The expected result is Negative.  Fact Sheet for Patients:  BloggerCourse.com  Fact Sheet for Healthcare Providers:  SeriousBroker.it  This test is no t yet approved or cleared by the [place] FDA and  has been authorized for detection and/or diagnosis of SARS-CoV-2 by FDA under an Emergency Use Authorization (EUA). This EUA will remain  in effect (meaning this test can be used) for the duration of the COVID-19 declaration under Section 564(b)(1) of the Act, 21 U.S.C.section 360bbb-3(b)(1), unless the authorization is terminated  or revoked  sooner.       Influenza A by PCR NEGATIVE NEGATIVE Final   Influenza B by PCR NEGATIVE NEGATIVE Final    Comment: (NOTE) The Xpert Xpress SARS-CoV-2/FLU/RSV plus assay is intended as an aid in the diagnosis of influenza from Nasopharyngeal swab specimens and should not be used as a sole basis for treatment. Nasal washings and aspirates are unacceptable for Xpert Xpress SARS-CoV-2/FLU/RSV testing.  Fact Sheet for Patients: BloggerCourse.com  Fact Sheet for Healthcare Providers: SeriousBroker.it  This test is not yet approved or cleared by the [place] FDA and has been authorized for detection and/or diagnosis of SARS-CoV-2 by FDA under an Emergency Use Authorization (EUA). This EUA will remain in effect (meaning this test can be used) for  the duration of the COVID-19 declaration under Section 564(b)(1) of the Act, 21 U.S.C. section 360bbb-3(b)(1), unless the authorization is terminated or revoked.  Performed at [facility], [address] [place], [place] [address]          Radiology Studies: No results found.      Scheduled Meds:  carvedilol  6.25 mg Oral BID WC   [START ON 10/18/2020] enoxaparin (LOVENOX) injection  60 mg Subcutaneous Q24H   furosemide  80 mg Intravenous Q8H   insulin aspart  0-20 Units Subcutaneous TID WC   insulin aspart  0-5 Units Subcutaneous QHS   insulin aspart  4 Units Subcutaneous TID WC   losartan  25 mg Oral Daily   magnesium oxide  400 mg Oral Daily   metolazone  5 mg Oral Daily   potassium chloride  40 mEq Oral Daily   sodium chloride flush  3 mL Intravenous Q12H   spironolactone  12.5 mg Oral Daily   Continuous Infusions:  sodium chloride       LOS: 6 days    Time spent: 28 minutes    Marrion Coy, MD [facility]   To contact the attending provider between 7A-7P or the covering provider during after hours 7P-7A, please log into the web site [email or web address] and access using universal [place] password for that web site. If you do not have the password, please call the hospital operator.  10/17/2020, 12:58 PM

## 2020-10-17 NOTE — Progress Notes (Signed)
OT Cancellation Note  Patient Details Name: Molly Howard MRN: [id number] DOB: 14-Mar-1986   Cancelled Treatment:    Reason Eval/Treat Not Completed: Patient declined, no reason specified. Upon attempt, pt in recliner after breakfast, easily wakes to therapist's voice. Pt politely declines ADL session or ADL mobility to improve activity tolerance. Agreeable to OT returning at later time.   Wynona Canes, MPH, MS, OTR/L ascom [phone] 10/17/20, 9:14 AM

## 2020-10-18 LAB — CBC
HCT: 32.4 % — ABNORMAL LOW (ref 36.0–46.0)
Hemoglobin: 9.8 g/dL — ABNORMAL LOW (ref 12.0–15.0)
MCH: 25.5 pg — ABNORMAL LOW (ref 26.0–34.0)
MCHC: 30.2 g/dL (ref 30.0–36.0)
MCV: 84.2 fL (ref 80.0–100.0)
Platelets: 474 10*3/uL — ABNORMAL HIGH (ref 150–400)
RBC: 3.85 MIL/uL — ABNORMAL LOW (ref 3.87–5.11)
RDW: 18.1 % — ABNORMAL HIGH (ref 11.5–15.5)
WBC: 5.2 10*3/uL (ref 4.0–10.5)
nRBC: 0 % (ref 0.0–0.2)

## 2020-10-18 LAB — CREATININE, SERUM
Creatinine, Ser: 1.01 mg/dL — ABNORMAL HIGH (ref 0.44–1.00)
GFR, Estimated: >60 mL/min (ref >60)

## 2020-10-18 LAB — GLUCOSE, CAPILLARY: Glucose-Capillary: 162 mg/dL — ABNORMAL HIGH (ref 70–99)

## 2020-10-18 MED ORDER — POTASSIUM CHLORIDE CRYS ER 20 MEQ PO TBCR
20.0000 meq | EXTENDED_RELEASE_TABLET | Freq: Two times a day (BID) | ORAL | 0 refills | Status: DC
  Filled 2020-10-18: qty 60, 30d supply, fill #0

## 2020-10-18 MED ORDER — LOSARTAN POTASSIUM 25 MG PO TABS
25.0000 mg | ORAL_TABLET | Freq: Every day | ORAL | 0 refills | Status: DC
  Filled 2020-10-18: qty 30, 30d supply, fill #0

## 2020-10-18 MED ORDER — MAGNESIUM OXIDE -MG SUPPLEMENT 400 (240 MG) MG PO TABS
400.0000 mg | ORAL_TABLET | Freq: Every day | ORAL | 0 refills | Status: DC
  Filled 2020-10-18: qty 30, 30d supply, fill #0

## 2020-10-18 MED ORDER — SPIRONOLACTONE 25 MG PO TABS
12.5000 mg | ORAL_TABLET | Freq: Every day | ORAL | 0 refills | Status: DC
  Filled 2020-10-18: qty 15, 30d supply, fill #0

## 2020-10-18 MED ORDER — METOLAZONE 5 MG PO TABS
5.0000 mg | ORAL_TABLET | Freq: Once | ORAL | Status: AC
  Administered 2020-10-18: 5 mg via ORAL
  Filled 2020-10-18: qty 1

## 2020-10-18 MED ORDER — POTASSIUM CHLORIDE CRYS ER 20 MEQ PO TBCR
60.0000 meq | EXTENDED_RELEASE_TABLET | Freq: Once | ORAL | Status: AC
  Administered 2020-10-18: 60 meq via ORAL
  Filled 2020-10-18: qty 3

## 2020-10-18 MED ORDER — TORSEMIDE 20 MG PO TABS
60.0000 mg | ORAL_TABLET | Freq: Once | ORAL | Status: AC
  Administered 2020-10-18: 60 mg via ORAL
  Filled 2020-10-18: qty 3

## 2020-10-18 NOTE — Plan of Care (Signed)

## 2020-10-18 NOTE — Progress Notes (Signed)
This RN provided discharge instructions and teaching to the patient. The patient verbalized and demonstrated understanding of the provided instructions. All outstanding questions resolved. R arm PIV removed by patient. All belongings packed and in tow. Volunteer services to transport patient to private vehicle via wheelchair at time of departure.

## 2020-10-18 NOTE — Progress Notes (Signed)
Progress Note  Patient Name: Molly Howard Date of Encounter: 10/18/2020  [facility] Cardiologist: Debbe Odea, MD   Subjective    Insisting on going home today, pulling off all her telemetry leads as we are rounding " I feel fine" Reports that she needs to go home, needs to see her family, take care of her pets, pay some bills On further discussion, family is already taking care of her pets " I does need to go home" Reports that she will be more compliant with her medications Reports that she stopped all of her medications at home when she felt euvolemic  Still with significant abdominal distention, leg edema -18.4 L  Inpatient Medications    Scheduled Meds:  carvedilol  6.25 mg Oral BID WC   enoxaparin (LOVENOX) injection  60 mg Subcutaneous Q24H   insulin aspart  0-20 Units Subcutaneous TID WC   insulin aspart  0-5 Units Subcutaneous QHS   insulin aspart  4 Units Subcutaneous TID WC   losartan  25 mg Oral Daily   magnesium oxide  400 mg Oral Daily   metolazone  5 mg Oral Daily   potassium chloride  40 mEq Oral Daily   sodium chloride flush  3 mL Intravenous Q12H   spironolactone  12.5 mg Oral Daily   Continuous Infusions:  sodium chloride     PRN Meds: sodium chloride, acetaminophen, ondansetron (ZOFRAN) IV, sodium chloride flush   Vital Signs    Vitals:   10/17/20 1100 10/17/20 2022 10/18/20 0519 10/18/20 0823  BP: 120/80 124/88 115/80 111/84  Pulse: 92 89 89 90  Resp: 17 17 19 16   Temp: 98 F (36.7 C) 98 F (36.7 C) 97.7 F (36.5 C) 97.8 F (36.6 C)  TempSrc: Oral Oral Oral   SpO2: 94% 98% 94% 95%  Weight:   120.6 kg   Height:        Intake/Output Summary (Last 24 hours) at 10/18/2020 1213 Last data filed at 10/18/2020 1000 Gross per 24 hour  Intake 890 ml  Output 3450 ml  Net -2560 ml   Last 3 Weights 10/18/2020 10/17/2020 10/16/2020  Weight (lbs) 265 lb 12.8 oz 273 lb 282 lb 3.2 oz  Weight (kg) 120.566 kg 123.832 kg 128.005 kg       Telemetry    Normal sinus rhythm- Personally Reviewed  ECG     - Personally Reviewed  Physical Exam   GEN: No acute distress.   Neck: JVD 10+ Cardiac: RRR, no murmurs, rubs, or gallops.  Respiratory: Clear to auscultation bilaterally scattered Rales at the bases GI: Soft, nontender, distended MS: 2+ pitting lower extremity edema; No deformity. Neuro:  Nonfocal  Psych: Normal affect   Labs    High Sensitivity Troponin:   Recent Labs  Lab 10/10/20 1312 10/10/20 2252  TROPONINIHS 14 15      Chemistry Recent Labs  Lab 10/15/20 0715 10/16/20 0441 10/17/20 0508 10/18/20 0541  NA 135 134* 135  --   K 3.7 3.5 3.6  --   CL 87* 88* 88*  --   CO2 38* 38* 38*  --   GLUCOSE 135* 113* 203*  --   BUN 21* 22* 23*  --   CREATININE 0.95 0.92 0.94 1.01*  CALCIUM 7.8* 7.6* 7.7*  --   GFRNONAA >60 >60 >60 >60  ANIONGAP 10 8 9   --      Hematology Recent Labs  Lab 10/15/20 0715 10/18/20 0541  WBC 4.2 5.2  RBC 4.02  3.85*  HGB 10.3* 9.8*  HCT 33.3* 32.4*  MCV 82.8 84.2  MCH 25.6* 25.5*  MCHC 30.9 30.2  RDW 17.5* 18.1*  PLT 482* 474*    BNP Recent Labs  Lab 10/14/20 0622  BNP 713.0*     DDimer No results for input(s): DDIMER in the last 168 hours.   Radiology    No results found.  Cardiac Studies     Patient Profile     35 y.o. female with history of HFrEF secondary to NICM, polysubstance abuse with ongoing cocaine use poorly controlled DM2, anemia, HLD, obesity, and GERD who we are evaluating for acute on chronic HFrEF.  Assessment & Plan    Acute on chronic systolic CHF   Nonischemic cardiomyopathy, substance abuse, poorly controlled diabetes   Ejection fraction 30 to 35% -18 L so far, Would like to leave, Long discussion concerning need to stay for further diuresis likely has additional 30 pounds of diuresis to go before she achieves euvolemic state -She does not care, wants to go, reports that she can continue medications at home --Recommend  she continue current doses of carvedilol, losartan, torsemide 40 twice daily with potassium 20 twice daily, Aldactone -Needs close follow-up with CHF clinic and our clinic -Does not have medications for tomorrow but will get them on Monday through medical management -We will decrease torsemide down to 40 daily with potassium 20 for weight less than 240 pounds -Recommend she do daily weights at home     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

## 2020-10-18 NOTE — Discharge Summary (Signed)
Physician Discharge Summary  Patient ID: Molly Howard MRN: [id number] DOB/AGE: 1986-05-02 35 y.o.  Admit date: 10/10/2020 Discharge date: 10/18/2020  Admission Diagnoses:  Discharge Diagnoses:  Principal Problem:   Acute on chronic combined systolic and diastolic heart failure (HCC) Active Problems:   NICM (nonischemic cardiomyopathy) (HCC)   Essential hypertension   Chronic anemia   Reactive thrombocytosis   Anasarca   Hyperglycemia due to type 2 diabetes mellitus ([name])   Hypomagnesemia   Discharged Condition: fair  Hospital Course:  Molly Howard is a 35 y.o. 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  She stated that a month ago her doctor cut her torsemide dose in half due to concern for her kidney function and since then she started retaining fluid. Admitted for CHF. Non compliant with meds. Cocaine positive. On lasix iv. Cardiology on board.  #1.  Acute on chronic combined systolic and diastolic congestive heart failure. Nonischemic cardiomyopathy. Anasarca. Pericardial effusion on chest x-ray. Patient has massive volume overload with anasarca.  Repeated echocardiogram showed ejection fraction 30 to 35%.  She is also positive for urine tox screen with cocaine. She is seen by cardiology, she is given IV Lasix.  She diuresed well.  However, she still way above her dry weight.  We were planning to keep her little longer, but patient refused to stay.  She pulled her all of her IV out and states that she cannot stay anymore.  Discussed with Dr. Rockey Situ, will continue diuretics orally, patient be followed by cardiology as outpatient.  2.  Uncontrolled type 2 diabetes with hyperglycemia. Resume home regimen.  3.  Cocaine abuse. Advised to quit.  #4.   Morbid obesity.  Consults: cardiology  Significant Diagnostic Studies:   Echo: 1. Left ventricular ejection fraction, by estimation, is 30 to 35%. The left ventricle has moderate to severely decreased function. The left ventricle demonstrates global hypokinesis. Left ventricular diastolic parameters are consistent with Grade II diastolic dysfunction (pseudonormalization). 2. Right ventricular systolic function is mildly reduced. The right ventricular size is normal. 3. A small pericardial effusion is present. 4. The mitral valve is normal in structure. No evidence of mitral valve regurgitation. 5. Tricuspid valve regurgitation is moderate. 6. The aortic valve is tricuspid. Aortic valve regurgitation is not visualized  Treatments: IV lasix  Discharge Exam: Blood pressure 111/84, pulse 90, temperature 97.8 F (36.6 C), resp. rate 16, height '5\' 7"'  (1.702 m), weight 120.6 kg, SpO2 95 %. General appearance: alert and cooperative Resp: clear to auscultation bilaterally Cardio: regular rate and rhythm, S1, S2 normal, no murmur, click, rub or gallop GI: soft, with some distention, none tender Extremities: 3+ edema  Disposition: Discharge disposition: 01-Home or Self Care       Discharge Instructions     Diet - low sodium heart healthy   Complete by: As directed    Fluid 1573m/day   Increase activity slowly   Complete by: As directed       Allergies as of 10/18/2020       Reactions   No [facility] Food Allergies Rash, Other (See Comments)   Lemons        Medication List     STOP taking these medications    lisinopril 5 MG tablet Commonly known as: ZESTRIL  potassium chloride SA 20 MEQ tablet Commonly known as: KLOR-CON       TAKE these medications    aspirin 81 MG EC tablet Take 1 tablet (81 mg total) by mouth daily.   BD Veo Insulin Syringe U/F 31G X 15/64" 0.3 ML Misc Generic drug: Insulin Syringe-Needle U-100 USE AS DIRECTED WITH INSULIN    blood glucose meter kit and supplies Kit Dispense based on patient and insurance preference. Use up to four times daily as directed. (FOR ICD-9 250.00, 250.01).   calcium carbonate 600 MG Tabs tablet Commonly known as: OS-CAL Take 1 tablet (600 mg total) by mouth 2 (two) times daily with a meal.   carvedilol 6.25 MG tablet Commonly known as: COREG TAKE ONE TABLET BY MOUTH 2 TIMES A DAY WITH MEALS What changed:  how much to take how to take this when to take this   Ferrex 150 150 MG capsule Generic drug: iron polysaccharides TAKE ONE CAPSULE BY MOUTH EVERY DAY What changed: how much to take   losartan 25 MG tablet Commonly known as: COZAAR Take 1 tablet (25 mg total) by mouth daily. Start taking on: October 19, 2020   magnesium oxide 400 (240 Mg) MG tablet Commonly known as: MAG-OX Take 1 tablet (400 mg total) by mouth daily. Start taking on: October 19, 2020   metolazone 5 MG tablet Commonly known as: ZAROXOLYN TAKE ONE TABLET BY MOUTH EVERY DAY 1 HR PRIOR TO TAKING TORSEMIDE What changed:  how much to take how to take this when to take this   NovoLOG Mix 70/30 (70-30) 100 UNIT/ML injection Generic drug: insulin aspart protamine- aspart INJECT 10 UNITS INTO THE SKIN 2 TIMES A DAY WITH MEALS What changed:  how much to take how to take this when to take this   Potassium Chloride 40 MEQ/15ML (20%) Soln Take 20 mEq by mouth 2 (two) times daily. What changed: how much to take   ReliOn Mini Pen Needles 31G X 6 MM Misc Generic drug: Insulin Pen Needle 1 Container by Does not apply route in the morning and at bedtime.   spironolactone 25 MG tablet Commonly known as: ALDACTONE Take 0.5 tablets (12.5 mg total) by mouth daily. Start taking on: October 19, 2020   Torsemide 40 MG Tabs Take 40 mg by mouth 2 (two) times daily. What changed: Another medication with the same name was removed. Continue taking this medication, and follow the directions you see here.       ASK  your doctor about these medications    atorvastatin 20 MG tablet Commonly known as: LIPITOR TAKE ONE TABLET BY MOUTH EVERY DAY        Follow-up Information     [name], MD Follow up in 1 week(s).   Specialties: Cardiology, Radiology Contact information: [place] [address] [phone]                34 minutes Signed: Sharen Hones 10/18/2020, 10:34 AM

## 2020-10-20 ENCOUNTER — Other Ambulatory Visit: Payer: Self-pay

## 2020-10-21 ENCOUNTER — Telehealth: Payer: Self-pay

## 2020-10-23 ENCOUNTER — Telehealth: Payer: Self-pay | Admitting: Pharmacy Technician

## 2020-10-23 DIAGNOSIS — Z79899 Other long term (current) drug therapy: Secondary | ICD-10-CM

## 2020-10-23 NOTE — Telephone Encounter (Signed)
Patient failed to provide 2022 proof of income.  No additional medication assistance will be provided by [facility] without the required proof of income documentation.  Patient notified by letter.  Sherilyn Dacosta Care Manager [facility]    [address] [place], [place]  [address]  October 23, 2020    Ireanna Finlayson [address] [place], [place]  [address]  Dear Noel Gerold:  This is to inform you that you are no longer eligible to receive medication assistance at [facility].  The reason(s) are:    _____Your total gross monthly household income exceeds 250% of the Federal Poverty Level.   _____Tangible assets (savings, checking, stocks/bonds, pension, retirement, etc.) exceeds our limit  _____You are eligible to receive benefits from [facility] or HIV Medication              Assistance Program _____You are eligible to receive benefits from a Medicare Part "D" plan _____You have prescription insurance  _____You are not an [facility] resident __X__Failure to provide all requested documentation (proof of income for 2022, and/or Patient Intake Application, DOH Attestation, Contract, etc).  Still need 2022 Disability Letter from [employer] and 2021 [place] [place] Tax Return.   Medication assistance will resume once all requested documentation has been returned to our clinic.  If you have questions, please contact our clinic at [phone].    Thank you,  [facility]

## 2020-10-29 ENCOUNTER — Other Ambulatory Visit: Payer: Self-pay

## 2020-10-31 ENCOUNTER — Ambulatory Visit: Payer: Self-pay | Admitting: Family

## 2020-11-05 NOTE — Progress Notes (Signed)
Patient ID: Molly Howard, female    DOB: 07-29-1985, 35 y.o.   MRN: [id number]  HPI  Molly Howard is a 35 y/o female with a history of DM, HTN, previous drug use and chronic heart failure.   Echo report from 10/11/20 reviewed and showed an EF of 30-35% along with moderate TR.   Admitted 10/10/20 due to acute on chronic HF. Cardiology consult obtained. Initially given IV lasix with transition to oral diuretics. UDS + for cocaine. Discharged after 8 days after she insisted on leaving.   She presents today for her initial visit with a chief complaint of moderate fatigue with little exertion. She says that this has been present for several months. She has associated cough, shortness of breath, intermittent chest pain, palpitations, abdominal distention/ pain, difficulty sleeping and gradual weight gain along with this. She denies any dizziness or pedal edema.   She is somewhat vague about her medication use and how long she's been out of most everything. She says that she was getting medications from [facility]. Says that she's been taking "only torsemide" but then she says she may be taking calcium as well.   Past Medical History:  Diagnosis Date   Acid reflux    CHF (congestive heart failure) (HCC)    Diabetes mellitus (HCC)    Obesity    Past Surgical History:  Procedure Laterality Date   CHOLECYSTECTOMY     HERNIA REPAIR     Family History  Problem Relation Age of Onset   Heart failure Mother        a. onset in her 59s   Diabetes Mother    Hypertension Father    Diabetes Father    Social History   Tobacco Use   Smoking status: Former    Packs/day: 0.50    Pack years: 0.00    Types: Cigarettes   Smokeless tobacco: Never  Substance Use Topics   Alcohol use: Not Currently    Comment: ~ 4 shots/day on weekends only   Allergies  Allergen Reactions   No [facility] Food Allergies Rash and Other (See Comments)    Lemons   Prior to Admission medications    Medication Sig Start Date End Date Taking? Authorizing Provider  atorvastatin (LIPITOR) 20 MG tablet TAKE ONE TABLET BY MOUTH EVERY DAY Patient taking differently: Take 20 mg by mouth daily. 05/22/20 05/22/21 Yes Darlin Priestly, MD  BD VEO INSULIN SYRINGE U/F 31G X 15/64" 0.3 ML MISC USE AS DIRECTED WITH INSULIN 05/22/20 05/22/21 Yes   blood glucose meter kit and supplies KIT Dispense based on patient and insurance preference. Use up to four times daily as directed. (FOR ICD-9 250.00, 250.01). 10/05/19  Yes Sheikh, Omair Latif, DO  calcium carbonate (OS-CAL) 600 MG TABS tablet Take 1 tablet (600 mg total) by mouth 2 (two) times daily with a meal. 08/06/20  Yes Agbor-Etang, Arlys John, MD  potassium chloride SA (KLOR-CON) 20 MEQ tablet Take 1 tablet (20 mEq total) by mouth 2 (two) times daily. 10/18/20  Yes Marrion Coy, MD  Torsemide 40 MG TABS Take 40 mg by mouth 2 (two) times daily. 09/11/20 12/10/20 Yes Debbe Odea, MD  aspirin EC 81 MG EC tablet Take 1 tablet (81 mg total) by mouth daily. Patient not taking: Reported on 11/06/2020 10/06/19   Marguerita Merles Latif, DO  carvedilol (COREG) 6.25 MG tablet TAKE ONE TABLET BY MOUTH 2 TIMES A DAY WITH MEALS Patient not taking: Reported on 11/06/2020 05/22/20 05/22/21  Fran Lowes,  Otila Kluver, MD  insulin aspart protamine- aspart (NOVOLOG MIX 70/30) (70-30) 100 UNIT/ML injection INJECT 10 UNITS INTO THE SKIN 2 TIMES A DAY WITH MEALS Patient not taking: Reported on 11/06/2020 05/22/20 05/22/21  Enzo Bi, MD  Insulin Pen Needle (RELION MINI PEN NEEDLES) 31G X 6 MM MISC 1 Container by Does not apply route in the morning and at bedtime. Patient not taking: Reported on 11/06/2020 10/05/19   Raiford Noble Latif, DO  iron polysaccharides (NIFEREX) 150 MG capsule TAKE ONE CAPSULE BY MOUTH EVERY DAY Patient not taking: Reported on 11/06/2020 05/22/20 05/22/21  Enzo Bi, MD  losartan (COZAAR) 25 MG tablet Take 1 tablet (25 mg total) by mouth daily. Patient not taking: Reported on 11/06/2020 10/19/20   Sharen Hones, MD  magnesium oxide (MAG-OX) 400 (240 Mg) MG tablet Take 1 tablet (400 mg total) by mouth daily. Patient not taking: Reported on 11/06/2020 10/19/20   Sharen Hones, MD  metolazone (ZAROXOLYN) 5 MG tablet TAKE ONE TABLET BY MOUTH EVERY DAY 1 HR PRIOR TO TAKING TORSEMIDE Patient not taking: Reported on 11/06/2020 07/18/20 07/18/21  [name], MD  spironolactone (ALDACTONE) 25 MG tablet Take 0.5 tablets (12.5 mg total) by mouth daily. Patient not taking: Reported on 11/06/2020 10/19/20   Sharen Hones, MD  Potassium Chloride 40 MEQ/15ML (20%) SOLN Take 40 mEq by mouth 2 (two) times daily. Patient not taking: Reported on 10/11/2020 07/04/20 10/18/20  [name], MD   Review of Systems  Constitutional:  Positive for fatigue (easily). Negative for appetite change.  HENT:  Negative for congestion, postnasal drip and sore throat.   Eyes: Negative.   Respiratory:  Positive for cough and shortness of breath.   Cardiovascular:  Positive for chest pain (sometimes at night) and palpitations. Negative for leg swelling.  Gastrointestinal:  Positive for abdominal distention and abdominal pain.  Endocrine: Negative.   Genitourinary: Negative.   Musculoskeletal:  Positive for arthralgias (left shoulder). Negative for back pain.  Skin: Negative.   Allergic/Immunologic: Negative.   Neurological:  Negative for dizziness and light-headedness.  Hematological:  Negative for adenopathy. Does not bruise/bleed easily.  Psychiatric/Behavioral:  Positive for sleep disturbance (sleeping on 2 pillows; + orthopnea). Negative for dysphoric mood. The patient is not nervous/anxious.    Vitals:   11/06/20 1147  BP: (!) 130/102  Pulse: (!) 104  Resp: 16  SpO2: 100%  Weight: 273 lb 2 oz (123.9 kg)  Height: _0  (1.702 m)   Wt Readings from Last 3 Encounters:  11/06/20 273 lb 2 oz (123.9 kg)  10/18/20 265 lb 12.8 oz (120.6 kg)  09/05/20 264 lb 3.2 oz (119.8 kg)   Lab Results  Component Value Date    CREATININE 1.01 (H) 10/18/2020   CREATININE 0.94 10/17/2020   CREATININE 0.92 10/16/2020   Physical Exam Vitals and nursing note reviewed. Exam conducted with a chaperone present (mom).  Constitutional:      Appearance: Normal appearance.  HENT:     Head: Normocephalic and atraumatic.  Cardiovascular:     Rate and Rhythm: Regular rhythm. Tachycardia present.  Pulmonary:     Effort: Pulmonary effort is normal. No respiratory distress.     Breath sounds: No wheezing or rales.  Abdominal:     General: There is distension.     Tenderness: There is abdominal tenderness.  Musculoskeletal:        General: No tenderness.     Cervical back: Normal range of motion and neck supple.     Right lower  leg: Edema present.     Left lower leg: Edema present.  Skin:    General: Skin is warm and dry.  Neurological:     General: No focal deficit present.     Mental Status: She is alert and oriented to person, place, and time.  Psychiatric:        Mood and Affect: Mood normal.        Behavior: Behavior normal.        Thought Content: Thought content normal.    Assessment & Plan:  1: Acute on Chronic heart failure with reduced ejection fraction- - NYHA class III - fluid overloaded with abdominal distention, pedal edema and weight gain - will send for 43m IV lasix & 443m PO potassium - BNP/ BMP to be drawn - saw cardiology (Agbor-Etang) 09/05/20; returns 11/13/20 - asked if patient could afford meds at [place] she gets her income information to [facility]; she says that she can get a few of them - will resume carvedilol 6.2573mID, losartan 60m8mily and spironolactone 12.5mg 31mly - BNP 10/14/20 was 713.0  2: HTN- - BP elevated but she's been without most of her medications; resuming some meds per above - emphasized that she needs to get established with [facility], [place]/22 reviewed and showed  sodium 135, potassium 3.6, creatinine 0.94 & GFR >60  3: DM- - A1c 10/11/20 was 11.3% - she says that she would like insulin pen instead of vial; explained this will need to come from primary care  4: Substance use- - UDS + for cocaine during recent admission; she currently denies any drug use   Relayed [facility] that they need 2022 income information before they can assist further with medications. Patient says that she is aware of this but hasn't gotten the information to them.   Return here in 3 weeks or sooner for any questions/problems before then.

## 2020-11-06 ENCOUNTER — Ambulatory Visit (HOSPITAL_BASED_OUTPATIENT_CLINIC_OR_DEPARTMENT_OTHER): Payer: Medicaid Other | Admitting: Family

## 2020-11-06 ENCOUNTER — Other Ambulatory Visit: Payer: Self-pay | Admitting: Family

## 2020-11-06 ENCOUNTER — Encounter: Payer: Self-pay | Admitting: Family

## 2020-11-06 ENCOUNTER — Ambulatory Visit
Admission: RE | Admit: 2020-11-06 | Discharge: 2020-11-06 | Disposition: A | Discharge status: Home / Self Care | Payer: Medicaid Other | Source: Ambulatory Visit | Attending: Family | Admitting: Family

## 2020-11-06 ENCOUNTER — Other Ambulatory Visit: Payer: Self-pay

## 2020-11-06 VITALS — BP 130/102 | HR 104 | Resp 16 | Ht 67.0 in | Wt 273.1 lb

## 2020-11-06 DIAGNOSIS — E119 Type 2 diabetes mellitus without complications: Secondary | ICD-10-CM | POA: Insufficient documentation

## 2020-11-06 DIAGNOSIS — I110 Hypertensive heart disease with heart failure: Secondary | ICD-10-CM | POA: Insufficient documentation

## 2020-11-06 DIAGNOSIS — Z8249 Family history of ischemic heart disease and other diseases of the circulatory system: Secondary | ICD-10-CM | POA: Diagnosis not present

## 2020-11-06 DIAGNOSIS — Z794 Long term (current) use of insulin: Secondary | ICD-10-CM | POA: Insufficient documentation

## 2020-11-06 DIAGNOSIS — I10 Essential (primary) hypertension: Secondary | ICD-10-CM

## 2020-11-06 DIAGNOSIS — I5023 Acute on chronic systolic (congestive) heart failure: Secondary | ICD-10-CM

## 2020-11-06 DIAGNOSIS — I5043 Acute on chronic combined systolic (congestive) and diastolic (congestive) heart failure: Secondary | ICD-10-CM | POA: Insufficient documentation

## 2020-11-06 DIAGNOSIS — E669 Obesity, unspecified: Secondary | ICD-10-CM | POA: Insufficient documentation

## 2020-11-06 DIAGNOSIS — Z79899 Other long term (current) drug therapy: Secondary | ICD-10-CM | POA: Insufficient documentation

## 2020-11-06 DIAGNOSIS — I5022 Chronic systolic (congestive) heart failure: Secondary | ICD-10-CM

## 2020-11-06 DIAGNOSIS — E1165 Type 2 diabetes mellitus with hyperglycemia: Secondary | ICD-10-CM

## 2020-11-06 DIAGNOSIS — F1910 Other psychoactive substance abuse, uncomplicated: Secondary | ICD-10-CM

## 2020-11-06 DIAGNOSIS — Z87891 Personal history of nicotine dependence: Secondary | ICD-10-CM | POA: Insufficient documentation

## 2020-11-06 LAB — BASIC METABOLIC PANEL
Anion gap: 9 (ref 5–15)
BUN: 24 mg/dL — ABNORMAL HIGH (ref 6–20)
CO2: 34 mmol/L — ABNORMAL HIGH (ref 22–32)
Calcium: 6.2 mg/dL — CL (ref 8.9–10.3)
Chloride: 93 mmol/L — ABNORMAL LOW (ref 98–111)
Creatinine, Ser: 1.04 mg/dL — ABNORMAL HIGH (ref 0.44–1.00)
GFR, Estimated: >60 mL/min (ref >60)
Glucose, Bld: 270 mg/dL — ABNORMAL HIGH (ref 70–99)
Potassium: 3.7 mmol/L (ref 3.5–5.1)
Sodium: 136 mmol/L (ref 135–145)

## 2020-11-06 LAB — BRAIN NATRIURETIC PEPTIDE: B Natriuretic Peptide: 858.7 pg/mL — ABNORMAL HIGH (ref 0.0–100.0)

## 2020-11-06 MED ORDER — LOSARTAN POTASSIUM 25 MG PO TABS: 25.0000 mg | ORAL_TABLET | Freq: Every day | ORAL | 5 refills | Status: DC

## 2020-11-06 MED ORDER — POTASSIUM CHLORIDE CRYS ER 20 MEQ PO TBCR
40.0000 meq | EXTENDED_RELEASE_TABLET | Freq: Once | ORAL | Status: AC
  Administered 2020-11-06: 40 meq via ORAL

## 2020-11-06 MED ORDER — FUROSEMIDE 10 MG/ML IJ SOLN
INTRAMUSCULAR | Status: AC
  Filled 2020-11-06: qty 8

## 2020-11-06 MED ORDER — CARVEDILOL 6.25 MG PO TABS: ORAL_TABLET | ORAL | 2 refills | Status: DC

## 2020-11-06 MED ORDER — POTASSIUM CHLORIDE CRYS ER 20 MEQ PO TBCR
EXTENDED_RELEASE_TABLET | ORAL | Status: AC
  Filled 2020-11-06: qty 2

## 2020-11-06 MED ORDER — SPIRONOLACTONE 25 MG PO TABS: 12.5000 mg | ORAL_TABLET | Freq: Every day | ORAL | 0 refills | Status: DC

## 2020-11-06 MED ORDER — FUROSEMIDE 10 MG/ML IJ SOLN
80.0000 mg | Freq: Once | INTRAMUSCULAR | Status: AC
  Administered 2020-11-06: 80 mg via INTRAVENOUS

## 2020-11-06 NOTE — Patient Instructions (Addendum)
Continue weighing daily and call for an overnight weight gain of > 2 pounds or a weekly weight gain of >5 pounds.    [facility]   [phone]  [address] [place] [address]   Or call [facility] or [facility]    Begin carvedilol as 1 tablet in the morning and 1 tablet in the evening Begin losartan as 1 tablet daily Begin spironolactone as 1/2 tablet daily

## 2020-11-07 ENCOUNTER — Telehealth: Payer: Self-pay | Admitting: Family

## 2020-11-07 NOTE — Telephone Encounter (Signed)
Called and spoke with patient about recent lab results on 11/06/20. Calcium level is low at 6.2  At recent hospital discharge she was told to take oscal 600mg  BID. Patient confirmed that she has the calcium but that she hasn't been taking them.   Emphasized that she needed to begin taking them as 1 tablet BID and that she needed to make sure she gets an appointment for primary care as we discussed yesterday.   She denies any numbness, tingling or confusion and verbalized understanding of taking the calcium

## 2020-11-13 ENCOUNTER — Encounter: Payer: Self-pay | Admitting: Cardiology

## 2020-11-13 ENCOUNTER — Other Ambulatory Visit: Payer: Self-pay

## 2020-11-13 ENCOUNTER — Ambulatory Visit (INDEPENDENT_AMBULATORY_CARE_PROVIDER_SITE_OTHER): Payer: Self-pay | Admitting: Cardiology

## 2020-11-13 VITALS — BP 126/90 | HR 111 | Ht 67.0 in | Wt 288.0 lb

## 2020-11-13 DIAGNOSIS — E7800 Pure hypercholesterolemia, unspecified: Secondary | ICD-10-CM

## 2020-11-13 DIAGNOSIS — I428 Other cardiomyopathies: Secondary | ICD-10-CM

## 2020-11-13 NOTE — Progress Notes (Signed)
Cardiology Office Note:    Date:  11/13/2020   ID:  Molly Howard, DOB 04-24-1986, MRN [id number]  PCP:  Patient, No Pcp Per (Inactive)   [facility]  Cardiologist:  Debbe Odea, MD  Advanced Practice Provider:  No care team member to display Electrophysiologist:  None       Referring MD: No ref. provider found   Chief Complaint  Patient presents with   Other    Hospital follow up - Patient c.o SOB and swelling. Meds reviewed verbally with patient.    History of Present Illness:    Molly Howard is a 35 y.o. female with a hx of NICM EF 20-25%, hyperlipidemia, diabetes, former smoker x 41yrs, polysubstance abuse, obesity presenting for follow-up.    Patient being seen for nonischemic cardiomyopathy.  Recently seen in the hospital due to volume overload.  UDS was positive for cocaine.  Repeat echo showed mildly improved EF to 30 to 35%.  -18 L so far that admission.  Discharged on torsemide 40 mg twice daily, Coreg, losartan, Aldactone were continued.  Discharge weight was 265 pounds.  She states not being able to obtain any medications since discharge on October 18, 2020.  This was about 3-4 weeks ago.  She usually gets her menstrual patient assistance, but was told she needed some paperwork which she does not have and as such has not taken any of her cardiac medications including diuretics.  She has gained over 20 pounds since discharge, endorses shortness of breath, abdominal distention, lower extremity edema.   Prior notes Echo 10/2020, EF 30 to 35%. Echo 05/20/2020 EF 20 to 25% Echo 10/2019 EF 35 to 40% Lexiscan Myoview 10/2019 no evidence for ischemia, EF 30% Insurance/cost issues preventing use of Entresto, SGLT2i Mother has a history of heart failure, has a device placed not sure if pacemaker or ICD.  Past Medical History:  Diagnosis Date   Acid reflux    CHF (congestive heart failure) (HCC)    Diabetes mellitus (HCC)    Hypertension    Obesity      Past Surgical History:  Procedure Laterality Date   CHOLECYSTECTOMY     HERNIA REPAIR      Current Medications: No outpatient medications have been marked as taking for the 11/13/20 encounter (Office Visit) with Debbe Odea, MD.     Allergies:   No healthtouch food allergies   Social History   Socioeconomic History   Marital status: Single    Spouse name: Not on file   Number of children: Not on file   Years of education: Not on file   Highest education level: Not on file  Occupational History   Not on file  Tobacco Use   Smoking status: Former    Packs/day: 0.50    Types: Cigarettes   Smokeless tobacco: Never  Vaping Use   Vaping Use: Never used  Substance and Sexual Activity   Alcohol use: Not Currently    Comment: ~ 4 shots/day on weekends only   Drug use: Not Currently    Types: Cocaine    Comment: Last day of use was prior to June 2021 hosp.visit.    Sexual activity: Not on file  Other Topics Concern   Not on file  Social History Narrative   Not on file   Social Determinants of Health   Financial Resource Strain: High Risk   Difficulty of Paying Living Expenses: Hard  Food Insecurity: Food Insecurity Present   Worried  About Running Out of Food in the Last Year: Often true   Ran Out of Food in the Last Year: Often true  Transportation Needs: Unmet Transportation Needs   Lack of Transportation (Medical): Yes   Lack of Transportation (Non-Medical): Yes  Physical Activity: Not on file  Stress: Not on file  Social Connections: Not on file     Family History: The patient's family history includes Diabetes in her father and mother; Heart failure in her mother; Hypertension in her father.  ROS:   Please see the history of present illness.     All other systems reviewed and are negative.  EKGs/Labs/Other Studies Reviewed:    The following studies were reviewed today:   EKG:  EKG is  ordered today.  The ekg ordered today demonstrates sinus  tachycardia, heart rate 111.  Recent Labs: 05/21/2020: ALT 13 10/17/2020: Magnesium 1.7 10/18/2020: Hemoglobin 9.8; Platelets 474 11/06/2020: B Natriuretic Peptide 858.7; BUN 24; Creatinine, Ser 1.04; Potassium 3.7; Sodium 136  Recent Lipid Panel    Component Value Date/Time   CHOL 212 (H) 05/21/2020 0643   TRIG 121 05/21/2020 0643   HDL 35 (L) 05/21/2020 0643   CHOLHDL 6.1 05/21/2020 0643   VLDL 24 05/21/2020 0643   LDLCALC 153 (H) 05/21/2020 0643     Risk Assessment/Calculations:      Physical Exam:    VS:  BP 126/90 (BP Location: Right Arm, Patient Position: Sitting, Cuff Size: Normal)   Pulse (!) 111   Ht 5\' 7"  (1.702 m)   Wt 288 lb (130.6 kg)   SpO2 97%   BMI 45.11 kg/m     Wt Readings from Last 3 Encounters:  11/13/20 288 lb (130.6 kg)  11/06/20 273 lb 2 oz (123.9 kg)  10/18/20 265 lb 12.8 oz (120.6 kg)     GEN:  Well nourished, well developed in no acute distress HEENT: Normal NECK: No JVD; No carotid bruits LYMPHATICS: No lymphadenopathy CARDIAC: RRR, no murmurs, rubs, gallops RESPIRATORY: Decreased breath sounds at bases ABDOMEN: Soft, non-tender, distended MUSCULOSKELETAL:  1+ edema; No deformity   SKIN: Warm and dry NEUROLOGIC:  Alert and oriented x 3 PSYCHIATRIC:  Normal affect   ASSESSMENT:    1. NICM (nonischemic cardiomyopathy) (HCC)   2. Pure hypercholesterolemia     PLAN:    In order of problems listed above:  Nonischemic cardiomyopathy, last EF 10/2020 was 30-35%. Lexiscan Myoview with no evidence for ischemia. Patient still describes NYHA class II-III symptoms although improving.  Office will reach out to medication management regarding why patient was not approved for her medications.  She obviously will need these meds in order to prevent readmissions.  She is volume overloaded.  Continue torsemide 40 mg twice daily. metolazone 5 mg daily.  Continue Coreg 6.25 mg twice daily, losartan 25 mg daily, Aldactone.  Lack of insurance preventing use  of Entresto/SGLT2i.  Hyperlipidemia, continue statin  Follow-up in 1 month     Medication Adjustments/Labs and Tests Ordered: Current medicines are reviewed at length with the patient today.  Concerns regarding medicines are outlined above.  Orders Placed This Encounter  Procedures   EKG 12-Lead    No orders of the defined types were placed in this encounter.   Patient Instructions  Medication Instructions:   Contact Medication Management to submit the required documents verifying your proof of income so that your medication coverage can be reinstated ASAP.   *If you need a refill on your cardiac medications before your next appointment, please  call your pharmacy*   Lab Work: None ordered If you have labs (blood work) drawn today and your tests are completely normal, you will receive your results only by: MyChart Message (if you have MyChart) OR A paper copy in the mail If you have any lab test that is abnormal or we need to change your treatment, we will call you to review the results.   Testing/Procedures: None ordered   Follow-Up: At [facility], you and your health needs are our priority.  As part of our continuing mission to provide you with exceptional heart care, we have created designated Provider Care Teams.  These Care Teams include your primary Cardiologist (physician) and Advanced Practice Providers (APPs -  Physician Assistants and Nurse Practitioners) who all work together to provide you with the care you need, when you need it.  We recommend signing up for the patient portal called "MyChart".  Sign up information is provided on this After Visit Summary.  MyChart is used to connect with patients for Virtual Visits (Telemedicine).  Patients are able to view lab/test results, encounter notes, upcoming appointments, etc.  Non-urgent messages can be sent to your provider as well.   To learn more about what you can do with MyChart, go to [email or web address].     Your next appointment:   1 month(s)  The format for your next appointment:   In Person  Provider:   Debbe Odea, MD   Other Instructions    Signed, Debbe Odea, MD  11/13/2020 1:04 PM    [facility]

## 2020-11-13 NOTE — Patient Instructions (Signed)
Medication Instructions:   Contact Medication Management to submit the required documents verifying your proof of income so that your medication coverage can be reinstated ASAP.   *If you need a refill on your cardiac medications before your next appointment, please call your pharmacy*   Lab Work: None ordered If you have labs (blood work) drawn today and your tests are completely normal, you will receive your results only by: MyChart Message (if you have MyChart) OR A paper copy in the mail If you have any lab test that is abnormal or we need to change your treatment, we will call you to review the results.   Testing/Procedures: None ordered   Follow-Up: At [facility], you and your health needs are our priority.  As part of our continuing mission to provide you with exceptional heart care, we have created designated Provider Care Teams.  These Care Teams include your primary Cardiologist (physician) and Advanced Practice Providers (APPs -  Physician Assistants and Nurse Practitioners) who all work together to provide you with the care you need, when you need it.  We recommend signing up for the patient portal called "MyChart".  Sign up information is provided on this After Visit Summary.  MyChart is used to connect with patients for Virtual Visits (Telemedicine).  Patients are able to view lab/test results, encounter notes, upcoming appointments, etc.  Non-urgent messages can be sent to your provider as well.   To learn more about what you can do with MyChart, go to [email or web address].    Your next appointment:   1 month(s)  The format for your next appointment:   In Person  Provider:   Debbe Odea, MD   Other Instructions

## 2020-11-26 ENCOUNTER — Ambulatory Visit: Payer: Self-pay | Admitting: Family

## 2020-11-27 ENCOUNTER — Other Ambulatory Visit: Payer: Self-pay

## 2020-11-27 ENCOUNTER — Inpatient Hospital Stay
Admission: EM | Admit: 2020-11-27 | Discharge: 2020-12-08 | DRG: 291 | Disposition: A | Discharge status: Home / Self Care | Payer: Medicaid Other | Attending: Internal Medicine | Admitting: Internal Medicine

## 2020-11-27 ENCOUNTER — Encounter: Payer: Self-pay | Admitting: Emergency Medicine

## 2020-11-27 ENCOUNTER — Emergency Department: Payer: Medicaid Other

## 2020-11-27 DIAGNOSIS — I5043 Acute on chronic combined systolic (congestive) and diastolic (congestive) heart failure: Secondary | ICD-10-CM | POA: Diagnosis present

## 2020-11-27 DIAGNOSIS — Z794 Long term (current) use of insulin: Secondary | ICD-10-CM

## 2020-11-27 DIAGNOSIS — Z597 Insufficient social insurance and welfare support: Secondary | ICD-10-CM

## 2020-11-27 DIAGNOSIS — Z9049 Acquired absence of other specified parts of digestive tract: Secondary | ICD-10-CM

## 2020-11-27 DIAGNOSIS — Z91120 Patient's intentional underdosing of medication regimen due to financial hardship: Secondary | ICD-10-CM

## 2020-11-27 DIAGNOSIS — Z87891 Personal history of nicotine dependence: Secondary | ICD-10-CM | POA: Diagnosis not present

## 2020-11-27 DIAGNOSIS — N182 Chronic kidney disease, stage 2 (mild): Secondary | ICD-10-CM | POA: Diagnosis present

## 2020-11-27 DIAGNOSIS — D509 Iron deficiency anemia, unspecified: Secondary | ICD-10-CM | POA: Diagnosis present

## 2020-11-27 DIAGNOSIS — F1410 Cocaine abuse, uncomplicated: Secondary | ICD-10-CM | POA: Diagnosis present

## 2020-11-27 DIAGNOSIS — E785 Hyperlipidemia, unspecified: Secondary | ICD-10-CM | POA: Diagnosis present

## 2020-11-27 DIAGNOSIS — Z8249 Family history of ischemic heart disease and other diseases of the circulatory system: Secondary | ICD-10-CM

## 2020-11-27 DIAGNOSIS — Z833 Family history of diabetes mellitus: Secondary | ICD-10-CM

## 2020-11-27 DIAGNOSIS — Z7982 Long term (current) use of aspirin: Secondary | ICD-10-CM | POA: Diagnosis not present

## 2020-11-27 DIAGNOSIS — Z91018 Allergy to other foods: Secondary | ICD-10-CM

## 2020-11-27 DIAGNOSIS — Z6841 Body Mass Index (BMI) 40.0 and over, adult: Secondary | ICD-10-CM | POA: Diagnosis not present

## 2020-11-27 DIAGNOSIS — E1129 Type 2 diabetes mellitus with other diabetic kidney complication: Secondary | ICD-10-CM | POA: Diagnosis present

## 2020-11-27 DIAGNOSIS — I5022 Chronic systolic (congestive) heart failure: Secondary | ICD-10-CM

## 2020-11-27 DIAGNOSIS — Z23 Encounter for immunization: Secondary | ICD-10-CM | POA: Diagnosis not present

## 2020-11-27 DIAGNOSIS — K219 Gastro-esophageal reflux disease without esophagitis: Secondary | ICD-10-CM | POA: Diagnosis present

## 2020-11-27 DIAGNOSIS — M79621 Pain in right upper arm: Secondary | ICD-10-CM | POA: Diagnosis present

## 2020-11-27 DIAGNOSIS — Z20822 Contact with and (suspected) exposure to covid-19: Secondary | ICD-10-CM | POA: Diagnosis present

## 2020-11-27 DIAGNOSIS — I10 Essential (primary) hypertension: Secondary | ICD-10-CM | POA: Diagnosis present

## 2020-11-27 DIAGNOSIS — T383X6A Underdosing of insulin and oral hypoglycemic [antidiabetic] drugs, initial encounter: Secondary | ICD-10-CM | POA: Diagnosis present

## 2020-11-27 DIAGNOSIS — E876 Hypokalemia: Secondary | ICD-10-CM | POA: Diagnosis present

## 2020-11-27 DIAGNOSIS — I5023 Acute on chronic systolic (congestive) heart failure: Secondary | ICD-10-CM

## 2020-11-27 DIAGNOSIS — N179 Acute kidney failure, unspecified: Secondary | ICD-10-CM | POA: Diagnosis present

## 2020-11-27 DIAGNOSIS — Y92009 Unspecified place in unspecified non-institutional (private) residence as the place of occurrence of the external cause: Secondary | ICD-10-CM

## 2020-11-27 DIAGNOSIS — E1165 Type 2 diabetes mellitus with hyperglycemia: Secondary | ICD-10-CM | POA: Diagnosis present

## 2020-11-27 DIAGNOSIS — F1110 Opioid abuse, uncomplicated: Secondary | ICD-10-CM | POA: Diagnosis present

## 2020-11-27 DIAGNOSIS — I428 Other cardiomyopathies: Secondary | ICD-10-CM | POA: Diagnosis present

## 2020-11-27 DIAGNOSIS — Z79899 Other long term (current) drug therapy: Secondary | ICD-10-CM

## 2020-11-27 DIAGNOSIS — I130 Hypertensive heart and chronic kidney disease with heart failure and stage 1 through stage 4 chronic kidney disease, or unspecified chronic kidney disease: Secondary | ICD-10-CM | POA: Diagnosis not present

## 2020-11-27 DIAGNOSIS — E1122 Type 2 diabetes mellitus with diabetic chronic kidney disease: Secondary | ICD-10-CM | POA: Diagnosis present

## 2020-11-27 HISTORY — DX: Other cardiomyopathies: I42.8

## 2020-11-27 HISTORY — DX: Patient's other noncompliance with medication regimen for other reason: Z91.148

## 2020-11-27 HISTORY — DX: Iron deficiency anemia, unspecified: D50.9

## 2020-11-27 HISTORY — DX: Chronic kidney disease, stage 2 (mild): N18.2

## 2020-11-27 HISTORY — DX: Patient's other noncompliance with medication regimen: Z91.14

## 2020-11-27 HISTORY — DX: Chronic systolic (congestive) heart failure: I50.22

## 2020-11-27 HISTORY — DX: Other psychoactive substance abuse, uncomplicated: F19.10

## 2020-11-27 LAB — CBC
HCT: 33.5 % — ABNORMAL LOW (ref 36.0–46.0)
Hemoglobin: 10 g/dL — ABNORMAL LOW (ref 12.0–15.0)
MCH: 25.6 pg — ABNORMAL LOW (ref 26.0–34.0)
MCHC: 29.9 g/dL — ABNORMAL LOW (ref 30.0–36.0)
MCV: 85.9 fL (ref 80.0–100.0)
Platelets: 488 10*3/uL — ABNORMAL HIGH (ref 150–400)
RBC: 3.9 MIL/uL (ref 3.87–5.11)
RDW: 18.2 % — ABNORMAL HIGH (ref 11.5–15.5)
WBC: 6.3 10*3/uL (ref 4.0–10.5)
nRBC: 0 % (ref 0.0–0.2)

## 2020-11-27 LAB — BASIC METABOLIC PANEL
Anion gap: 10 (ref 5–15)
BUN: 30 mg/dL — ABNORMAL HIGH (ref 6–20)
CO2: 30 mmol/L (ref 22–32)
Calcium: 7.8 mg/dL — ABNORMAL LOW (ref 8.9–10.3)
Chloride: 93 mmol/L — ABNORMAL LOW (ref 98–111)
Creatinine, Ser: 1.25 mg/dL — ABNORMAL HIGH (ref 0.44–1.00)
GFR, Estimated: 57 mL/min — ABNORMAL LOW (ref >60)
Glucose, Bld: 339 mg/dL — ABNORMAL HIGH (ref 70–99)
Potassium: 4.9 mmol/L (ref 3.5–5.1)
Sodium: 133 mmol/L — ABNORMAL LOW (ref 135–145)

## 2020-11-27 LAB — RESP PANEL BY RT-PCR (FLU A&B, COVID) ARPGX2
Influenza A by PCR: NEGATIVE
Influenza B by PCR: NEGATIVE
SARS Coronavirus 2 by RT PCR: NEGATIVE

## 2020-11-27 LAB — CBG MONITORING, ED
Glucose-Capillary: 319 mg/dL — ABNORMAL HIGH (ref 70–99)
Glucose-Capillary: 219 mg/dL — ABNORMAL HIGH (ref 70–99)

## 2020-11-27 LAB — TROPONIN I (HIGH SENSITIVITY): Troponin I (High Sensitivity): 13 ng/L (ref <18)

## 2020-11-27 LAB — BRAIN NATRIURETIC PEPTIDE: B Natriuretic Peptide: 1043.9 pg/mL — ABNORMAL HIGH (ref 0.0–100.0)

## 2020-11-27 LAB — GLUCOSE, CAPILLARY: Glucose-Capillary: 214 mg/dL — ABNORMAL HIGH (ref 70–99)

## 2020-11-27 MED ORDER — HYDRALAZINE HCL 20 MG/ML IJ SOLN: 5.0000 mg | INTRAMUSCULAR | Status: DC | PRN

## 2020-11-27 MED ORDER — FUROSEMIDE 10 MG/ML IJ SOLN
60.0000 mg | Freq: Two times a day (BID) | INTRAMUSCULAR | Status: DC
  Administered 2020-11-27 – 2020-11-28 (×2): 60 mg via INTRAVENOUS
  Filled 2020-11-27: qty 6
  Filled 2020-11-27: qty 8
  Filled 2020-11-27: qty 6

## 2020-11-27 MED ORDER — ALBUTEROL SULFATE HFA 108 (90 BASE) MCG/ACT IN AERS: 2.0000 | INHALATION_SPRAY | RESPIRATORY_TRACT | Status: DC | PRN

## 2020-11-27 MED ORDER — INSULIN ASPART PROT & ASPART (70-30 MIX) 100 UNIT/ML ~~LOC~~ SUSP
8.0000 [IU] | Freq: Two times a day (BID) | SUBCUTANEOUS | Status: DC
  Administered 2020-11-27 – 2020-12-08 (×23): 8 [IU] via SUBCUTANEOUS
  Filled 2020-11-27: qty 10

## 2020-11-27 MED ORDER — PNEUMOCOCCAL VAC POLYVALENT 25 MCG/0.5ML IJ INJ
0.5000 mL | INJECTION | INTRAMUSCULAR | Status: AC
  Administered 2020-11-28: 0.5 mL via INTRAMUSCULAR
  Filled 2020-11-27: qty 0.5

## 2020-11-27 MED ORDER — ATORVASTATIN CALCIUM 20 MG PO TABS
20.0000 mg | ORAL_TABLET | Freq: Every day | ORAL | Status: DC
  Administered 2020-11-27 – 2020-12-08 (×12): 20 mg via ORAL
  Filled 2020-11-27 (×12): qty 1

## 2020-11-27 MED ORDER — DM-GUAIFENESIN ER 30-600 MG PO TB12
1.0000 | ORAL_TABLET | Freq: Two times a day (BID) | ORAL | Status: DC | PRN
  Administered 2020-11-27 – 2020-11-29 (×3): 1 via ORAL
  Filled 2020-11-27 (×4): qty 1

## 2020-11-27 MED ORDER — CARVEDILOL 6.25 MG PO TABS
6.2500 mg | ORAL_TABLET | Freq: Two times a day (BID) | ORAL | Status: DC
  Administered 2020-11-27 – 2020-12-08 (×22): 6.25 mg via ORAL
  Filled 2020-11-27 (×22): qty 1

## 2020-11-27 MED ORDER — ONDANSETRON HCL 4 MG/2ML IJ SOLN: 4.0000 mg | Freq: Three times a day (TID) | INTRAMUSCULAR | Status: DC | PRN

## 2020-11-27 MED ORDER — HYDROCOD POLST-CPM POLST ER 10-8 MG/5ML PO SUER
5.0000 mL | Freq: Two times a day (BID) | ORAL | Status: DC | PRN
  Administered 2020-11-27 – 2020-12-07 (×12): 5 mL via ORAL
  Filled 2020-11-27 (×16): qty 5

## 2020-11-27 MED ORDER — ASPIRIN EC 81 MG PO TBEC
81.0000 mg | DELAYED_RELEASE_TABLET | Freq: Every day | ORAL | Status: DC
  Administered 2020-11-27 – 2020-11-28 (×2): 81 mg via ORAL
  Filled 2020-11-27 (×2): qty 1

## 2020-11-27 MED ORDER — ALBUTEROL SULFATE (2.5 MG/3ML) 0.083% IN NEBU: 2.5000 mg | INHALATION_SOLUTION | RESPIRATORY_TRACT | Status: DC | PRN

## 2020-11-27 MED ORDER — ACETAMINOPHEN 325 MG PO TABS
650.0000 mg | ORAL_TABLET | Freq: Four times a day (QID) | ORAL | Status: DC | PRN
  Administered 2020-12-01 – 2020-12-07 (×4): 650 mg via ORAL
  Filled 2020-11-27 (×4): qty 2

## 2020-11-27 MED ORDER — POLYSACCHARIDE IRON COMPLEX 150 MG PO CAPS
150.0000 mg | ORAL_CAPSULE | Freq: Every day | ORAL | Status: DC
  Administered 2020-11-28 – 2020-12-08 (×11): 150 mg via ORAL
  Filled 2020-11-27 (×11): qty 1

## 2020-11-27 MED ORDER — INSULIN ASPART 100 UNIT/ML IJ SOLN
0.0000 [IU] | Freq: Every day | INTRAMUSCULAR | Status: DC
  Administered 2020-11-27 – 2020-11-28 (×2): 2 [IU] via SUBCUTANEOUS
  Administered 2020-11-30: 3 [IU] via SUBCUTANEOUS
  Administered 2020-12-01: 2 [IU] via SUBCUTANEOUS
  Administered 2020-12-02 – 2020-12-05 (×3): 3 [IU] via SUBCUTANEOUS
  Administered 2020-12-06: 2 [IU] via SUBCUTANEOUS
  Administered 2020-12-07: 3 [IU] via SUBCUTANEOUS
  Filled 2020-11-27 (×9): qty 1

## 2020-11-27 MED ORDER — ENOXAPARIN SODIUM 60 MG/0.6ML IJ SOSY
0.5000 mg/kg | PREFILLED_SYRINGE | INTRAMUSCULAR | Status: DC
  Administered 2020-11-27: 52.5 mg via SUBCUTANEOUS
  Filled 2020-11-27 (×2): qty 0.53

## 2020-11-27 MED ORDER — FUROSEMIDE 10 MG/ML IJ SOLN
80.0000 mg | Freq: Once | INTRAMUSCULAR | Status: AC
  Administered 2020-11-27: 80 mg via INTRAVENOUS
  Filled 2020-11-27: qty 8

## 2020-11-27 MED ORDER — INSULIN ASPART 100 UNIT/ML IJ SOLN
0.0000 [IU] | Freq: Three times a day (TID) | INTRAMUSCULAR | Status: DC
  Administered 2020-11-27: 11 [IU] via SUBCUTANEOUS
  Administered 2020-11-27: 5 [IU] via SUBCUTANEOUS
  Administered 2020-11-28 – 2020-12-01 (×7): 3 [IU] via SUBCUTANEOUS
  Administered 2020-12-01: 5 [IU] via SUBCUTANEOUS
  Administered 2020-12-01: 3 [IU] via SUBCUTANEOUS
  Administered 2020-12-02 (×2): 5 [IU] via SUBCUTANEOUS
  Administered 2020-12-02: 2 [IU] via SUBCUTANEOUS
  Administered 2020-12-03: 8 [IU] via SUBCUTANEOUS
  Administered 2020-12-03 – 2020-12-04 (×2): 3 [IU] via SUBCUTANEOUS
  Administered 2020-12-04: 5 [IU] via SUBCUTANEOUS
  Administered 2020-12-05: 8 [IU] via SUBCUTANEOUS
  Administered 2020-12-05 (×2): 3 [IU] via SUBCUTANEOUS
  Administered 2020-12-06: 8 [IU] via SUBCUTANEOUS
  Administered 2020-12-06: 3 [IU] via SUBCUTANEOUS
  Administered 2020-12-06: 8 [IU] via SUBCUTANEOUS
  Administered 2020-12-07: 3 [IU] via SUBCUTANEOUS
  Administered 2020-12-07: 5 [IU] via SUBCUTANEOUS
  Administered 2020-12-07: 3 [IU] via SUBCUTANEOUS
  Administered 2020-12-08: 5 [IU] via SUBCUTANEOUS
  Administered 2020-12-08: 3 [IU] via SUBCUTANEOUS
  Filled 2020-11-27 (×28): qty 1

## 2020-11-27 NOTE — ED Triage Notes (Addendum)
Pt to triage via w/c, frequent dry cough noted, EMS brought pt in from home for c/o [facility] and leg swelling this morning; hx CHF, taking lasix 40mg  twice daily; abd & LE(s) grossly edematous, skin taut; 40lb wt gain reported this wk

## 2020-11-27 NOTE — Progress Notes (Signed)
   Heart Failure Nurse Navigator Note  HFrEF 30 to 35%.   Met with patient briefly while she is in the emergency room.  States that her breathing has improved, at home she had noted increasing shortness of breath, orthopnea and lower extremity edema.  When questioned she stated that she was weighing herself and was also taking her medications as ordered.  Call to [facility] where prescriptions had been phoned in for her on July 7 after she had been seen in the outpatient heart failure clinic with Darylene Price per the pharmacy tech she has not picked up the Coreg, losartan or spironolactone.  Also called medication management pharmacy, informed me that she is only filled medications there once and has not turned in the eligibility paperwork nor the application.  When questioned she denied any use of cocaine since her last hospitalization.  She did admit to some dietary indiscretions such as eating hot dogs.  Will continue to follow during this hospitalization.  Pricilla Riffle RN [facility]

## 2020-11-27 NOTE — Progress Notes (Addendum)
Pt arrived to the floor at this time via bed alert and oriented x 4. Vital Signs stable. Pt unable to stand for weight due to edema. Weight taken with bed scale. Pt oriented to the room, call light within reach and bed in lowest position. Pt ordering meal with dietary.

## 2020-11-27 NOTE — ED Notes (Signed)
Patient provided with pillow and water per request at this time, approx urine output noted in cannister at this time, patient reports mild shortness of breath, repeat EKG performed per MD order.

## 2020-11-27 NOTE — ED Notes (Signed)
Patient requesting cough medication, given Mucinex, MD notified, awaiting additional orders.

## 2020-11-27 NOTE — ED Provider Notes (Signed)
[facility] Emergency Department Provider Note  ____________________________________________   Event Date/Time   First MD Initiated Contact with Patient 11/27/20 [phone]     (approximate)  I have reviewed the triage vital signs and the nursing notes.   HISTORY  Chief Complaint Shortness of Breath    HPI Molly Howard is a 35 y.o. female with past medical history of hypertension, CHF, diabetes, here with shortness of breath.  The patient states that she has gained approximately 40 pounds in the last 1 to 2 weeks.  She has been taking her outpatient Lasix and metolazone without significant relief.  She said increasing lower extremity swelling, abdominal distention, cough, and shortness of breath.  She has had fatigue.  She said no chest pain.  She is called her cardiologist and was told to come to the ER if she continued to gain weight.  She has had cough and orthopnea.  Denies any chest pain.  She has shortness of breath at rest as well as with exertion.  All of her symptoms are worse lying flat as well as with exertion.  No alleviating factors.    Past Medical History:  Diagnosis Date   Acid reflux    CHF (congestive heart failure) ([place])    Diabetes mellitus ([place])    Hypertension    Obesity     Patient Active Problem List   Diagnosis Date Noted   Hypomagnesemia 10/17/2020   Anasarca 10/11/2020   Hyperglycemia due to type 2 diabetes mellitus ([place]) 10/11/2020   Dyspnea 05/21/2020   Elevated troponin 05/20/2020   Essential hypertension 05/20/2020   Diabetes mellitus, type II ([place]) 05/20/2020   Chronic anemia 05/20/2020   Reactive thrombocytosis 05/20/2020   Obesity (BMI 30-39.9) 05/20/2020   Acute on chronic combined systolic and diastolic heart failure ([place]) 05/20/2020   CHF (congestive heart failure) ([place]) 05/19/2020   Chronic combined systolic and diastolic heart failure ([place]) 11/01/2019   Financial difficulties 11/01/2019   Medication management  11/01/2019   Lung nodule 11/01/2019   NICM (nonischemic cardiomyopathy) ([place]) 11/01/2019   Polysubstance abuse ([place]) 11/01/2019   Tobacco use 11/01/2019   HFrEF (heart failure with reduced ejection fraction) ([facility])    Swelling    Symptomatic anemia 10/02/2019    Past Surgical History:  Procedure Laterality Date   CHOLECYSTECTOMY     HERNIA REPAIR      Prior to Admission medications   Medication Sig Start Date End Date Taking? Authorizing Provider  aspirin EC 81 MG EC tablet Take 1 tablet (81 mg total) by mouth daily. Patient not taking: No sig reported 10/06/19   Raiford Noble Latif, DO  atorvastatin (LIPITOR) 20 MG tablet TAKE ONE TABLET BY MOUTH EVERY DAY Patient not taking: Reported on 11/13/2020 05/22/20 05/22/21  Enzo Bi, MD  BD VEO INSULIN SYRINGE U/F 31G X 15/64" 0.3 ML MISC USE AS DIRECTED WITH INSULIN Patient not taking: Reported on 11/13/2020 05/22/20 05/22/21    blood glucose meter kit and supplies KIT Dispense based on patient and insurance preference. Use up to four times daily as directed. (FOR ICD-9 250.00, 250.01). Patient not taking: Reported on 11/13/2020 10/05/19   Raiford Noble Latif, DO  calcium carbonate (OS-CAL) 600 MG TABS tablet Take 1 tablet (600 mg total) by mouth 2 (two) times daily with a meal. Patient not taking: Reported on 11/13/2020 08/06/20   [name], MD  carvedilol (COREG) 6.25 MG tablet TAKE ONE TABLET BY MOUTH 2 TIMES A DAY WITH MEALS Patient not  taking: Reported on 11/13/2020 11/06/20 11/06/21  Alisa Graff, FNP  insulin aspart protamine- aspart (NOVOLOG MIX 70/30) (70-30) 100 UNIT/ML injection INJECT 10 UNITS INTO THE SKIN 2 TIMES A DAY WITH MEALS Patient not taking: No sig reported 05/22/20 05/22/21  Enzo Bi, MD  Insulin Pen Needle (RELION MINI PEN NEEDLES) 31G X 6 MM MISC 1 Container by Does not apply route in the morning and at bedtime. Patient not taking: No sig reported 10/05/19   Raiford Noble Latif, DO  iron polysaccharides (NIFEREX) 150 MG  capsule TAKE ONE CAPSULE BY MOUTH EVERY DAY Patient not taking: No sig reported 05/22/20 05/22/21  Enzo Bi, MD  losartan (COZAAR) 25 MG tablet Take 1 tablet (25 mg total) by mouth daily. Patient not taking: Reported on 11/13/2020 11/06/20   Darylene Price A, FNP  magnesium oxide (MAG-OX) 400 (240 Mg) MG tablet Take 1 tablet (400 mg total) by mouth daily. Patient not taking: No sig reported 10/19/20   Sharen Hones, MD  metolazone (ZAROXOLYN) 5 MG tablet TAKE ONE TABLET BY MOUTH EVERY DAY 1 HR PRIOR TO TAKING TORSEMIDE Patient not taking: No sig reported 07/18/20 07/18/21  [name], MD  potassium chloride SA (KLOR-CON) 20 MEQ tablet Take 1 tablet (20 mEq total) by mouth 2 (two) times daily. Patient not taking: Reported on 11/13/2020 10/18/20   Sharen Hones, MD  spironolactone (ALDACTONE) 25 MG tablet Take 0.5 tablets (12.5 mg total) by mouth daily. Patient not taking: Reported on 11/13/2020 11/06/20   Alisa Graff, FNP  Torsemide 40 MG TABS Take 40 mg by mouth 2 (two) times daily. Patient not taking: Reported on 11/13/2020 09/11/20 12/10/20  [name], MD  Potassium Chloride 40 MEQ/15ML (20%) SOLN Take 40 mEq by mouth 2 (two) times daily. Patient not taking: Reported on 10/11/2020 07/04/20 10/18/20  [name], MD    Allergies No healthtouch food allergies  Family History  Problem Relation Age of Onset   Heart failure Mother        a. onset in her 79s   Diabetes Mother    Hypertension Father    Diabetes Father     Social History Social History   Tobacco Use   Smoking status: Former    Packs/day: 0.50    Types: Cigarettes   Smokeless tobacco: Never  Vaping Use   Vaping Use: Never used  Substance Use Topics   Alcohol use: Not Currently    Comment: ~ 4 shots/day on weekends only   Drug use: Not Currently    Types: Cocaine    Comment: Last day of use was prior to June 2021 hosp.visit.     Review of Systems  Review of Systems  Constitutional:  Positive for  fatigue. Negative for fever.  HENT:  Negative for congestion and sore throat.   Eyes:  Negative for visual disturbance.  Respiratory:  Positive for cough and shortness of breath.   Cardiovascular:  Positive for leg swelling. Negative for chest pain.  Gastrointestinal:  Negative for abdominal pain, diarrhea, nausea and vomiting.  Genitourinary:  Negative for flank pain.  Musculoskeletal:  Negative for back pain and neck pain.  Skin:  Negative for rash and wound.  Neurological:  Positive for weakness.    ____________________________________________  PHYSICAL EXAM:      VITAL SIGNS: ED Triage Vitals  Enc Vitals Group     BP 11/27/20 0604 (!) 135/101     Pulse Rate 11/27/20 0604 (!) 115     Resp 11/27/20 0604 20  Temp 11/27/20 0604 97.9 F (36.6 C)     Temp Source 11/27/20 0604 Oral     SpO2 11/27/20 0600 99 %     Weight 11/27/20 0603 230 lb (104.3 kg)     Height 11/27/20 0603 '5\' 7"'  (1.702 m)     Head Circumference --      Peak Flow --      Pain Score 11/27/20 0603 8     Pain Loc --      Pain Edu? --      Excl. in [place]? --      Physical Exam Vitals and nursing note reviewed.  Constitutional:      General: She is not in acute distress.    Appearance: She is well-developed.  HENT:     Head: Normocephalic and atraumatic.  Eyes:     Conjunctiva/sclera: Conjunctivae normal.  Cardiovascular:     Rate and Rhythm: Normal rate and regular rhythm.     Heart sounds: Normal heart sounds. No murmur heard.   No friction rub.  Pulmonary:     Effort: Pulmonary effort is normal. No respiratory distress.     Breath sounds: Examination of the right-lower field reveals rales. Examination of the left-lower field reveals rales. Rales present. No wheezing.  Abdominal:     General: There is no distension.     Palpations: Abdomen is soft.     Tenderness: There is no abdominal tenderness.  Musculoskeletal:     Cervical back: Neck supple.     Right lower leg: Edema (2+) present.     Left  lower leg: Edema (2+) present.  Skin:    General: Skin is warm.     Capillary Refill: Capillary refill takes less than 2 seconds.  Neurological:     Mental Status: She is alert and oriented to person, place, and time.     Motor: No abnormal muscle tone.      ____________________________________________   LABS (all labs ordered are listed, but only abnormal results are displayed)  Labs Reviewed  CBC - Abnormal; Notable for the following components:      Result Value   Hemoglobin 10.0 (*)    HCT 33.5 (*)    MCH 25.6 (*)    MCHC 29.9 (*)    RDW 18.2 (*)    Platelets 488 (*)    All other components within normal limits  BASIC METABOLIC PANEL - Abnormal; Notable for the following components:   Sodium 133 (*)    Chloride 93 (*)    Glucose, Bld 339 (*)    BUN 30 (*)    Creatinine, Ser 1.25 (*)    Calcium 7.8 (*)    GFR, Estimated 57 (*)    All other components within normal limits  BRAIN NATRIURETIC PEPTIDE - Abnormal; Notable for the following components:   B Natriuretic Peptide 1,043.9 (*)    All other components within normal limits  RESP PANEL BY RT-PCR (FLU A&B, COVID) ARPGX2  TROPONIN I (HIGH SENSITIVITY)  TROPONIN I (HIGH SENSITIVITY)    ____________________________________________  EKG: Sinus tachycardia, ventricular 113.  PR 136, QRS 64, QTc 400.  No acute ST elevations or depressions.  No acute evidence of acute ischemia or infarct. ________________________________________  RADIOLOGY All imaging, including plain films, CT scans, and ultrasounds, independently reviewed by me, and interpretations confirmed via formal radiology reads.  ED MD interpretation:   Chest x-ray: Stable cardiomegaly and pulmonary vascular congestion  Official radiology report(s): DG Chest 2 View  Result Date:  11/27/2020 CLINICAL DATA:  35 year old female with chest pain, nonproductive cough, shortness of breath and lower extremity swelling. Unintentional weight gain. History of CHF.  EXAM: CHEST - 2 VIEW COMPARISON:  Chest radiograph 10/10/2020 and earlier. FINDINGS: Seated AP and lateral views of the chest. Large body habitus. Lordotic AP view. Stable cardiomegaly and mediastinal contours. Other mediastinal contours are within normal limits. Visualized tracheal air column is within normal limits. Pulmonary vascularity appears stable since last month, no overt edema. No pneumothorax. No pleural effusion is evident. No consolidation. No acute osseous abnormality identified. Negative visible bowel gas pattern. IMPRESSION: Stable cardiomegaly and pulmonary vascular congestion since last month without overt edema. Electronically Signed   By: Genevie Ann M.D.   On: 11/27/2020 07:22    ____________________________________________  PROCEDURES   Procedure(s) performed (including Critical Care):  Procedures  ____________________________________________  INITIAL IMPRESSION / MDM / [place] / ED COURSE  As part of my medical decision making, I reviewed the following data within the [place] notes reviewed and incorporated, Old chart reviewed, Notes from prior ED visits, and [place] Controlled Substance Database       *[place] was evaluated in Emergency Department on 11/27/2020 for the symptoms described in the history of present illness. She was evaluated in the context of the global COVID-19 pandemic, which necessitated consideration that the patient might be at risk for infection with the SARS-CoV-2 virus that causes COVID-19. Institutional protocols and algorithms that pertain to the evaluation of patients at risk for COVID-19 are in a state of rapid change based on information released by regulatory bodies including the CDC and federal and state organizations. These policies and algorithms were followed during the patient's care in the ED.  Some ED evaluations and interventions may be delayed as a result of limited staffing during the pandemic.*      Medical Decision Making: 35 year old female with history of CHF due to nonischemic cardiomyopathy here with worsening shortness of breath.  Patient has had 40 pounds of weight gain.  She appears anasarca, exam with bilateral rails, tachycardia, and mild increased work of breathing.  Lab work reviewed, is overall unremarkable with the exception of significantly elevated BNP.  Renal function is at baseline.  No leukocytosis.  Troponin negative and EKG is nonischemic.  Chest x-ray shows mild edema.  Will start IV Lasix and plan to admit given failure of outpatient Lasix and metolazone course.  Patient updated and in agreement.  ____________________________________________  FINAL CLINICAL IMPRESSION(S) / ED DIAGNOSES  Final diagnoses:  Acute on chronic systolic congestive heart failure ([place])     MEDICATIONS GIVEN DURING THIS VISIT:  Medications  furosemide (LASIX) injection 80 mg (80 mg Intravenous Given 11/27/20 0805)     ED Discharge Orders     None        Note:  This document was prepared using Dragon voice recognition software and may include unintentional dictation errors.   Duffy Bruce, MD 11/27/20 0900

## 2020-11-27 NOTE — ED Notes (Signed)
Pt assisted from w/c to stretcher with assistance. Pt noted to be shob and coughing Reports gaining 40 pounds in one week. Generalized edema noted.  Placed on monitors. Call bell in reach

## 2020-11-27 NOTE — H&P (Signed)
History and Physical    Molly Howard TSV:[id number] DOB: Aug 18, 1985 DOA: 11/27/2020  Referring MD/NP/PA:   PCP: Patient, No Pcp Per (Inactive)   Patient coming from:  The patient is coming from home.  At baseline, pt is independent for most of ADL.        Chief Complaint: Shortness of breath  HPI: Molly Howard is a 35 y.o. female with medical history significant of CHF with EF 30 to 35%, hypertension, hyperlipidemia, diabetes mellitus, GERD, anemia, former smoker, CKD-2, polysubstance abuse, who presents with shortness breath.  Patient states that her shortness breath has been going on for more than 1 week, which has been progressively worsening.  Patient also has dry cough, worsening bilateral leg edema, generalized weakness, approximately 40 pounds of weight gain in the past 2 weeks.  She has orthopnea.  Her shortness breath is worse when laying flat.  Denies chest pain, fever or chills.  No nausea, vomiting, diarrhea or abdominal pain.  No symptoms of UTI.  ED Course: pt was found to have BNP 1043, troponin level 13, negative COVID PCR, slightly worsening renal function, temperature normal, blood pressure 127/96, heart rate 110, RR 20, oxygen saturation 98% on room air.  Chest x-ray showed cardiomegaly and vascular congestion.  Patient is admitted to progressive bed as inpatient.  Review of Systems:   General: no fevers, chills, has body weight gain, has fatigue HEENT: no blurry vision, hearing changes or sore throat Respiratory: has dyspnea, coughing, no wheezing CV: no chest pain, no palpitations GI: no nausea, vomiting, abdominal pain, diarrhea, constipation GU: no dysuria, burning on urination, increased urinary frequency, hematuria  Ext: has leg edema Neuro: no unilateral weakness, numbness, or tingling, no vision change or hearing loss Skin: no rash, no skin tear. MSK: No muscle spasm, no deformity, no limitation of range of movement in spin Heme: No easy bruising.  Travel  history: No recent long distant travel.  Allergy:  Allergies  Allergen Reactions   No [facility] Food Allergies Rash and Other (See Comments)    Lemons    Past Medical History:  Diagnosis Date   Acid reflux    CHF (congestive heart failure) ([place])    Diabetes mellitus ([place])    Hypertension    Obesity     Past Surgical History:  Procedure Laterality Date   CHOLECYSTECTOMY     HERNIA REPAIR      Social History:  reports that she has quit smoking. Her smoking use included cigarettes. She smoked an average of .5 packs per day. She has never used smokeless tobacco. She reports previous alcohol use. She reports previous drug use. Drug: Cocaine.  Family History:  Family History  Problem Relation Age of Onset   Heart failure Mother        a. onset in her 35s   Diabetes Mother    Hypertension Father    Diabetes Father      Prior to Admission medications   Medication Sig Start Date End Date Taking? Authorizing Provider  aspirin EC 81 MG EC tablet Take 1 tablet (81 mg total) by mouth daily. Patient not taking: No sig reported 10/06/19   Raiford Noble Latif, DO  atorvastatin (LIPITOR) 20 MG tablet TAKE ONE TABLET BY MOUTH EVERY DAY Patient not taking: Reported on 11/13/2020 05/22/20 05/22/21  Enzo Bi, MD  BD VEO INSULIN SYRINGE U/F 31G X 15/64" 0.3 ML MISC USE AS DIRECTED WITH INSULIN Patient not taking: Reported on 11/13/2020 05/22/20 05/22/21  blood glucose meter kit and supplies KIT Dispense based on patient and insurance preference. Use up to four times daily as directed. (FOR ICD-9 250.00, 250.01). Patient not taking: Reported on 11/13/2020 10/05/19   Raiford Noble Latif, DO  calcium carbonate (OS-CAL) 600 MG TABS tablet Take 1 tablet (600 mg total) by mouth 2 (two) times daily with a meal. Patient not taking: Reported on 11/13/2020 08/06/20   [name], MD  carvedilol (COREG) 6.25 MG tablet TAKE ONE TABLET BY MOUTH 2 TIMES A DAY WITH MEALS Patient not taking: Reported on  11/13/2020 11/06/20 11/06/21  Alisa Graff, FNP  insulin aspart protamine- aspart (NOVOLOG MIX 70/30) (70-30) 100 UNIT/ML injection INJECT 10 UNITS INTO THE SKIN 2 TIMES A DAY WITH MEALS Patient not taking: No sig reported 05/22/20 05/22/21  Enzo Bi, MD  Insulin Pen Needle (RELION MINI PEN NEEDLES) 31G X 6 MM MISC 1 Container by Does not apply route in the morning and at bedtime. Patient not taking: No sig reported 10/05/19   Raiford Noble Latif, DO  iron polysaccharides (NIFEREX) 150 MG capsule TAKE ONE CAPSULE BY MOUTH EVERY DAY Patient not taking: No sig reported 05/22/20 05/22/21  Enzo Bi, MD  losartan (COZAAR) 25 MG tablet Take 1 tablet (25 mg total) by mouth daily. Patient not taking: Reported on 11/13/2020 11/06/20   Darylene Price A, FNP  magnesium oxide (MAG-OX) 400 (240 Mg) MG tablet Take 1 tablet (400 mg total) by mouth daily. Patient not taking: No sig reported 10/19/20   Sharen Hones, MD  metolazone (ZAROXOLYN) 5 MG tablet TAKE ONE TABLET BY MOUTH EVERY DAY 1 HR PRIOR TO TAKING TORSEMIDE Patient not taking: No sig reported 07/18/20 07/18/21  [name], MD  potassium chloride SA (KLOR-CON) 20 MEQ tablet Take 1 tablet (20 mEq total) by mouth 2 (two) times daily. Patient not taking: Reported on 11/13/2020 10/18/20   Sharen Hones, MD  spironolactone (ALDACTONE) 25 MG tablet Take 0.5 tablets (12.5 mg total) by mouth daily. Patient not taking: Reported on 11/13/2020 11/06/20   Alisa Graff, FNP  Torsemide 40 MG TABS Take 40 mg by mouth 2 (two) times daily. Patient not taking: Reported on 11/13/2020 09/11/20 12/10/20  [name], MD  Potassium Chloride 40 MEQ/15ML (20%) SOLN Take 40 mEq by mouth 2 (two) times daily. Patient not taking: Reported on 10/11/2020 07/04/20 10/18/20  [name], MD    Physical Exam: Vitals:   11/27/20 0600 11/27/20 0603 11/27/20 0604 11/27/20 0811  BP:   (!) 135/101 (!) 127/96  Pulse:   (!) 115 (!) 110  Resp:   20 12  Temp:   97.9 F (36.6 C)    TempSrc:   Oral   SpO2: 99%  100% 98%  Weight:  104.3 kg    Height:  '5\' 7"'  (1.702 m)     General: Not in acute distress HEENT:       Eyes: PERRL, EOMI, no scleral icterus.       ENT: No discharge from the ears and nose, no pharynx injection, no tonsillar enlargement.        Neck: Difficult to assess JVD due to obesity. no bruit, no mass felt. Heme: No neck lymph node enlargement. Cardiac: S1/S2, RRR, No murmurs, No gallops or rubs. Respiratory: has fine crackles bilaterally GI: Soft, nondistended, nontender, no rebound pain, no organomegaly, BS present. GU: No hematuria Ext: 2+ pitting leg edema bilaterally. 1+DP/PT pulse bilaterally. Musculoskeletal: No joint deformities, No joint redness or warmth, no limitation of  ROM in spin. Skin: No rashes.  Neuro: Alert, oriented X3, cranial nerves II-XII grossly intact, moves all extremities normally.  Psych: Patient is not psychotic, no suicidal or hemocidal ideation.  Labs on Admission: I have personally reviewed following labs and imaging studies  CBC: Recent Labs  Lab 11/27/20 0623  WBC 6.3  HGB 10.0*  HCT 33.5*  MCV 85.9  PLT 239*   Basic Metabolic Panel: Recent Labs  Lab 11/27/20 0623  NA 133*  K 4.9  CL 93*  CO2 30  GLUCOSE 339*  BUN 30*  CREATININE 1.25*  CALCIUM 7.8*   GFR: Estimated Creatinine Clearance: 77.3 mL/min (A) (by C-G formula based on SCr of 1.25 mg/dL (H)). Liver Function Tests: No results for input(s): AST, ALT, ALKPHOS, BILITOT, PROT, ALBUMIN in the last 168 hours. No results for input(s): LIPASE, AMYLASE in the last 168 hours. No results for input(s): AMMONIA in the last 168 hours. Coagulation Profile: No results for input(s): INR, PROTIME in the last 168 hours. Cardiac Enzymes: No results for input(s): CKTOTAL, CKMB, CKMBINDEX, TROPONINI in the last 168 hours. BNP (last 3 results) No results for input(s): PROBNP in the last 8760 hours. HbA1C: No results for input(s): HGBA1C in the last 72  hours. CBG: No results for input(s): GLUCAP in the last 168 hours. Lipid Profile: No results for input(s): CHOL, HDL, LDLCALC, TRIG, CHOLHDL, LDLDIRECT in the last 72 hours. Thyroid Function Tests: No results for input(s): TSH, T4TOTAL, FREET4, T3FREE, THYROIDAB in the last 72 hours. Anemia Panel: No results for input(s): VITAMINB12, FOLATE, FERRITIN, TIBC, IRON, RETICCTPCT in the last 72 hours. Urine analysis: No results found for: COLORURINE, APPEARANCEUR, LABSPEC, PHURINE, GLUCOSEU, HGBUR, BILIRUBINUR, KETONESUR, PROTEINUR, UROBILINOGEN, NITRITE, LEUKOCYTESUR Sepsis Labs: '@LABRCNTIP' (procalcitonin:4,lacticidven:4) ) Recent Results (from the past 240 hour(s))  Resp Panel by RT-PCR (Flu A&B, Covid) Nasopharyngeal Swab     Status: None   Collection Time: 11/27/20  8:02 AM   Specimen: Nasopharyngeal Swab; Nasopharyngeal(NP) swabs in vial transport medium  Result Value Ref Range Status   SARS Coronavirus 2 by RT PCR NEGATIVE NEGATIVE Final    Comment: (NOTE) SARS-CoV-2 target nucleic acids are NOT DETECTED.  The SARS-CoV-2 RNA is generally detectable in upper respiratory specimens during the acute phase of infection. The lowest concentration of SARS-CoV-2 viral copies this assay can detect is 138 copies/mL. A negative result does not preclude SARS-Cov-2 infection and should not be used as the sole basis for treatment or other patient management decisions. A negative result may occur with  improper specimen collection/handling, submission of specimen other than nasopharyngeal swab, presence of viral mutation(s) within the areas targeted by this assay, and inadequate number of viral copies(<138 copies/mL). A negative result must be combined with clinical observations, patient history, and epidemiological information. The expected result is Negative.  Fact Sheet for Patients:  EntrepreneurPulse.com.au  Fact Sheet for Healthcare Providers:   IncredibleEmployment.be  This test is no t yet approved or cleared by the [place] FDA and  has been authorized for detection and/or diagnosis of SARS-CoV-2 by FDA under an Emergency Use Authorization (EUA). This EUA will remain  in effect (meaning this test can be used) for the duration of the COVID-19 declaration under Section 564(b)(1) of the Act, 21 U.S.C.section 360bbb-3(b)(1), unless the authorization is terminated  or revoked sooner.       Influenza A by PCR NEGATIVE NEGATIVE Final   Influenza B by PCR NEGATIVE NEGATIVE Final    Comment: (NOTE) The Xpert Xpress SARS-CoV-2/FLU/RSV plus assay is  intended as an aid in the diagnosis of influenza from Nasopharyngeal swab specimens and should not be used as a sole basis for treatment. Nasal washings and aspirates are unacceptable for Xpert Xpress SARS-CoV-2/FLU/RSV testing.  Fact Sheet for Patients: EntrepreneurPulse.com.au  Fact Sheet for Healthcare Providers: IncredibleEmployment.be  This test is not yet approved or cleared by the [place] FDA and has been authorized for detection and/or diagnosis of SARS-CoV-2 by FDA under an Emergency Use Authorization (EUA). This EUA will remain in effect (meaning this test can be used) for the duration of the COVID-19 declaration under Section 564(b)(1) of the Act, 21 U.S.C. section 360bbb-3(b)(1), unless the authorization is terminated or revoked.  Performed at [facility], [address] [place], [place] [address]      Radiological Exams on Admission: DG Chest 2 View  Result Date: 11/27/2020 CLINICAL DATA:  35 year old female with chest pain, nonproductive cough, shortness of breath and lower extremity swelling. Unintentional weight gain. History of CHF. EXAM: CHEST - 2 VIEW COMPARISON:  Chest radiograph 10/10/2020 and earlier. FINDINGS: Seated AP and lateral views of the chest. Large body habitus.  Lordotic AP view. Stable cardiomegaly and mediastinal contours. Other mediastinal contours are within normal limits. Visualized tracheal air column is within normal limits. Pulmonary vascularity appears stable since last month, no overt edema. No pneumothorax. No pleural effusion is evident. No consolidation. No acute osseous abnormality identified. Negative visible bowel gas pattern. IMPRESSION: Stable cardiomegaly and pulmonary vascular congestion since last month without overt edema. Electronically Signed   By: Genevie Ann M.D.   On: 11/27/2020 07:22     EKG:  Not done in ED, will get one.   Assessment/Plan Principal Problem:   Acute on chronic combined systolic and diastolic CHF (congestive heart failure) ([facility]) Active Problems:   Essential hypertension   HLD (hyperlipidemia)   Type II diabetes mellitus with renal manifestations ([facility])   Chronic kidney disease (CKD), stage II (mild)   Iron deficiency anemia   Acute on chronic combined systolic and diastolic CHF (congestive heart failure) ([place]): 2D echo on 10/11/2020 showed EF of 30-35% with grade 2 diastolic dysfunction.  Patient has orthopnea, 40 pounds of weight gain, elevated BNP 1043, vascular congestion on chest x-ray, clinically consistent with CHF exacerbation.  -Will admit to progressive unit as inpatient -Lasix 60 mg bid by IV (patient received 80 mg of Lasix in the ED) -trend trop -Daily weights -strict I/O's -Low salt diet -Fluid restriction -Obtain REDs Vest reading  Essential hypertension: Patient's not taking blood pressure medication currently -IV hydralazine as needed -Coreg -Hold Cozaar due to worsening renal function  HLD (hyperlipidemia): Patient is not taking Lipitor currently -Restart Lipitor  Type II diabetes mellitus with renal manifestations ([place]): Recent A1c 11.3, poorly controlled.  Patient is not taking 70/30 insulin 10 unit twice daily currently -70/30 insulin 8 mg twice daily -Sliding scale  insulin  Chronic kidney disease (CKD), stage II (mild): Baseline creatinine is~1.0.  Her creatinine is 1.25, being 30, slightly worsening than baseline, may be due to cardiorenal syndrome. -Hold Cozaar -Monitor renal function by BMP  Iron deficiency anemia: Stable hemoglobin, 10.0 (9.8 on 10/18/2020).  Patient is not taking iron supplement currently -Restart iron supplement today         DVT ppx: SQ Lovenox Code Status: Full code Family Communication: not done, no family member is at bed side.     Disposition Plan:  Anticipate discharge back to previous environment Consults called:  none Admission status and Level  of care: Progressive Cardiac:    as inpt      Status is: Inpatient  Remains inpatient appropriate because:Inpatient level of care appropriate due to severity of illness  Dispo: The patient is from: Home              Anticipated d/c is to: Home              Patient currently is not medically stable to d/c.   Difficult to place patient No            Date of Service 11/27/2020    [facility]   If 7PM-7AM, please contact night-coverage [email or web address] 11/27/2020, 11:10 AM

## 2020-11-28 ENCOUNTER — Encounter: Payer: Self-pay | Admitting: Internal Medicine

## 2020-11-28 DIAGNOSIS — I5043 Acute on chronic combined systolic (congestive) and diastolic (congestive) heart failure: Secondary | ICD-10-CM

## 2020-11-28 LAB — BASIC METABOLIC PANEL
Anion gap: 10 (ref 5–15)
BUN: 32 mg/dL — ABNORMAL HIGH (ref 6–20)
CO2: 32 mmol/L (ref 22–32)
Calcium: 7.9 mg/dL — ABNORMAL LOW (ref 8.9–10.3)
Chloride: 94 mmol/L — ABNORMAL LOW (ref 98–111)
Creatinine, Ser: 1.19 mg/dL — ABNORMAL HIGH (ref 0.44–1.00)
GFR, Estimated: >60 mL/min (ref >60)
Glucose, Bld: 98 mg/dL (ref 70–99)
Potassium: 4.4 mmol/L (ref 3.5–5.1)
Sodium: 136 mmol/L (ref 135–145)

## 2020-11-28 LAB — URINE DRUG SCREEN, QUALITATIVE (ARMC ONLY)
Amphetamines, Ur Screen: NOT DETECTED
Barbiturates, Ur Screen: NOT DETECTED
Benzodiazepine, Ur Scrn: NOT DETECTED
Cannabinoid 50 Ng, Ur ~~LOC~~: NOT DETECTED
Cocaine Metabolite,Ur ~~LOC~~: POSITIVE — AB
MDMA (Ecstasy)Ur Screen: NOT DETECTED
Methadone Scn, Ur: NOT DETECTED
Opiate, Ur Screen: POSITIVE — AB
Phencyclidine (PCP) Ur S: NOT DETECTED
Tricyclic, Ur Screen: NOT DETECTED

## 2020-11-28 LAB — GLUCOSE, CAPILLARY
Glucose-Capillary: 108 mg/dL — ABNORMAL HIGH (ref 70–99)
Glucose-Capillary: 187 mg/dL — ABNORMAL HIGH (ref 70–99)
Glucose-Capillary: 153 mg/dL — ABNORMAL HIGH (ref 70–99)
Glucose-Capillary: 202 mg/dL — ABNORMAL HIGH (ref 70–99)

## 2020-11-28 LAB — MAGNESIUM: Magnesium: 1.6 mg/dL — ABNORMAL LOW (ref 1.7–2.4)

## 2020-11-28 MED ORDER — ENOXAPARIN SODIUM 100 MG/ML IJ SOSY
0.5000 mg/kg | PREFILLED_SYRINGE | INTRAMUSCULAR | Status: DC
  Administered 2020-11-28 – 2020-11-29 (×2): 97.5 mg via SUBCUTANEOUS
  Filled 2020-11-28 (×3): qty 0.97

## 2020-11-28 MED ORDER — SACUBITRIL-VALSARTAN 24-26 MG PO TABS
1.0000 | ORAL_TABLET | Freq: Two times a day (BID) | ORAL | Status: DC
  Administered 2020-11-28 – 2020-12-08 (×20): 1 via ORAL
  Filled 2020-11-28 (×20): qty 1

## 2020-11-28 MED ORDER — MAGNESIUM SULFATE 2 GM/50ML IV SOLN
2.0000 g | Freq: Once | INTRAVENOUS | Status: AC
  Administered 2020-11-28: 2 g via INTRAVENOUS
  Filled 2020-11-28: qty 50

## 2020-11-28 MED ORDER — FUROSEMIDE 10 MG/ML IJ SOLN
80.0000 mg | Freq: Two times a day (BID) | INTRAMUSCULAR | Status: DC
  Administered 2020-11-28 – 2020-11-29 (×2): 80 mg via INTRAVENOUS
  Filled 2020-11-28 (×2): qty 8

## 2020-11-28 NOTE — Progress Notes (Signed)
PROGRESS NOTE    Molly Howard  ZOX:[id number] DOB: 1985/12/07 DOA: 11/27/2020 PCP: Patient, No Pcp Per (Inactive)   Brief Narrative: Taken from H&P. Molly Howard is a 35 y.o. female with medical history significant of nonischemic cardiomyopathy and CHF with EF 30 to 35%, hypertension, hyperlipidemia, diabetes mellitus, GERD, anemia, former smoker, CKD-2, polysubstance abuse, who presents with shortness breath.   Patient states that her shortness breath has been going on for more than 1 week, which has been progressively worsening.  Patient also has dry cough, worsening bilateral leg edema, generalized weakness, approximately 40 pounds of weight gain in the past 2 weeks.  She has orthopnea.  Her shortness breath is worse when laying flat.  Denies chest pain, fever or chills.  Found to have BNP of 1043, COVID PCR negative, mild AKI and chest x-ray positive for cardiomegaly and vascular congestion.  Patient admitted for acute on chronic HFrEF with volume overload and started on IV diuresis.  Subjective: Patient was seen and examined today.  Stating that breathing seems improving.  Per patient she is having good amount of urination, not much recorded in chart.  Assessment & Plan:   Principal Problem:   Acute on chronic combined systolic and diastolic CHF (congestive heart failure) (HCC) Active Problems:   Essential hypertension   HLD (hyperlipidemia)   Type II diabetes mellitus with renal manifestations (HCC)   Chronic kidney disease (CKD), stage II (mild)   Iron deficiency anemia  Acute on chronic combined systolic and diastolic CHF (congestive heart failure) (HCC): 2D echo on 10/11/2020 showed EF of 30-35% with grade 2 diastolic dysfunction.  Patient has orthopnea, 40 pounds of weight gain, elevated BNP 1043, vascular congestion on chest x-ray, clinically consistent with CHF exacerbation. Recent admissions due to similar reasons, saw cardiology on 11/13/2020 and was advised to take her  medications. Apparently she has not picked up her medications from pharmacy yet. Not much urinary output recorded but according to patient she is passing good amount of urine. -Cardiology consult -Continue with Lasix 60 mg twice daily -Strict intake and output -Daily weight and BMP  Essential hypertension.  Patient was not taking her blood pressure medications either. Blood pressure currently within goal. -Continue with Coreg along with Lasix. -Holding Cozaar due to AKI-can be resumed if needed as renal function improved.  HLD (hyperlipidemia): Patient is not taking Lipitor currently. -Continue Lipitor which was restarted during this admission  Type II diabetes mellitus with renal manifestations [facility]): Recent A1c 11.3, poorly controlled.  Patient is not taking 70/30 insulin 10 unit twice daily currently -70/30 insulin 8 mg twice daily -Sliding scale insulin   Chronic kidney disease (CKD), stage II (mild): Baseline creatinine is~1.0.  On admission it was 1.25, some improvement today to 1.19. -Keep holding Cozaar until blood pressure started trending up. -Continue with IV diuresis -Monitor renal function -Nephrotoxins  Iron deficiency anemia: Stable hemoglobin, 10.0 (9.8 on 10/18/2020).  Patient is not taking iron supplement currently -Restarted iron supplement on admission.  Objective: Vitals:   11/28/20 0523 11/28/20 0544 11/28/20 0856 11/28/20 1235  BP: 105/70  (!) 128/98 119/87  Pulse: 95  96 93  Resp: 16  17 17   Temp: 98.3 F (36.8 C)  97.6 F (36.4 C) 98.8 F (37.1 C)  TempSrc:   Oral Oral  SpO2: 100%  100% 95%  Weight:  (!) 194.5 kg    Height:        Intake/Output Summary (Last 24 hours) at 11/28/2020 1351 Last data  filed at 11/28/2020 1000 Gross per 24 hour  Intake 340 ml  Output 700 ml  Net -360 ml   Filed Weights   11/27/20 0603 11/27/20 1820 11/28/20 0544  Weight: 104.3 kg (!) 193.4 kg (!) 194.5 kg    Examination:  General exam: Appears calm and  comfortable  Respiratory system: Clear to auscultation. Respiratory effort normal. Cardiovascular system: S1 & S2 heard, RRR.  Positive JVD Gastrointestinal system: Soft, nontender, nondistended, bowel sounds positive. Central nervous system: Alert and oriented. No focal neurological deficits.Symmetric 5 x 5 power. Extremities: 3+ LE edema, abdominal wall edema, no cyanosis, pulses intact and symmetrical. Psychiatry: Judgement and insight appear normal.   DVT prophylaxis: Lovenox Code Status: Full Family Communication: Discussed with patient Disposition Plan:  Status is: Inpatient  Remains inpatient appropriate because:Inpatient level of care appropriate due to severity of illness  Dispo: The patient is from: Home              Anticipated d/c is to: Home              Patient currently is not medically stable to d/c.   Difficult to place patient No              Level of care: Progressive Cardiac  All the records are reviewed and case discussed with Care Management/Social Worker. Management plans discussed with the patient, nursing and they are in agreement.  Consultants:  Cardiology  Procedures:  Antimicrobials:   Data Reviewed: I have personally reviewed following labs and imaging studies  CBC: Recent Labs  Lab 11/27/20 0623  WBC 6.3  HGB 10.0*  HCT 33.5*  MCV 85.9  PLT 488*   Basic Metabolic Panel: Recent Labs  Lab 11/27/20 0623 11/28/20 0505  NA 133* 136  K 4.9 4.4  CL 93* 94*  CO2 30 32  GLUCOSE 339* 98  BUN 30* 32*  CREATININE 1.25* 1.19*  CALCIUM 7.8* 7.9*  MG  --  1.6*   GFR: Estimated Creatinine Clearance: 118.4 mL/min (A) (by C-G formula based on SCr of 1.19 mg/dL (H)). Liver Function Tests: No results for input(s): AST, ALT, ALKPHOS, BILITOT, PROT, ALBUMIN in the last 168 hours. No results for input(s): LIPASE, AMYLASE in the last 168 hours. No results for input(s): AMMONIA in the last 168 hours. Coagulation Profile: No results for input(s):  INR, PROTIME in the last 168 hours. Cardiac Enzymes: No results for input(s): CKTOTAL, CKMB, CKMBINDEX, TROPONINI in the last 168 hours. BNP (last 3 results) No results for input(s): PROBNP in the last 8760 hours. HbA1C: No results for input(s): HGBA1C in the last 72 hours. CBG: Recent Labs  Lab 11/27/20 1218 11/27/20 1656 11/27/20 2103 11/28/20 0857 11/28/20 1236  GLUCAP 319* 219* 214* 108* 187*   Lipid Profile: No results for input(s): CHOL, HDL, LDLCALC, TRIG, CHOLHDL, LDLDIRECT in the last 72 hours. Thyroid Function Tests: No results for input(s): TSH, T4TOTAL, FREET4, T3FREE, THYROIDAB in the last 72 hours. Anemia Panel: No results for input(s): VITAMINB12, FOLATE, FERRITIN, TIBC, IRON, RETICCTPCT in the last 72 hours. Sepsis Labs: No results for input(s): PROCALCITON, LATICACIDVEN in the last 168 hours.  Recent Results (from the past 240 hour(s))  Resp Panel by RT-PCR (Flu A&B, Covid) Nasopharyngeal Swab     Status: None   Collection Time: 11/27/20  8:02 AM   Specimen: Nasopharyngeal Swab; Nasopharyngeal(NP) swabs in vial transport medium  Result Value Ref Range Status   SARS Coronavirus 2 by RT PCR NEGATIVE NEGATIVE Final  Comment: (NOTE) SARS-CoV-2 target nucleic acids are NOT DETECTED.  The SARS-CoV-2 RNA is generally detectable in upper respiratory specimens during the acute phase of infection. The lowest concentration of SARS-CoV-2 viral copies this assay can detect is 138 copies/mL. A negative result does not preclude SARS-Cov-2 infection and should not be used as the sole basis for treatment or other patient management decisions. A negative result may occur with  improper specimen collection/handling, submission of specimen other than nasopharyngeal swab, presence of viral mutation(s) within the areas targeted by this assay, and inadequate number of viral copies(<138 copies/mL). A negative result must be combined with clinical observations, patient history,  and epidemiological information. The expected result is Negative.  Fact Sheet for Patients:  BloggerCourse.com  Fact Sheet for Healthcare Providers:  SeriousBroker.it  This test is no t yet approved or cleared by the [place] FDA and  has been authorized for detection and/or diagnosis of SARS-CoV-2 by FDA under an Emergency Use Authorization (EUA). This EUA will remain  in effect (meaning this test can be used) for the duration of the COVID-19 declaration under Section 564(b)(1) of the Act, 21 U.S.C.section 360bbb-3(b)(1), unless the authorization is terminated  or revoked sooner.       Influenza A by PCR NEGATIVE NEGATIVE Final   Influenza B by PCR NEGATIVE NEGATIVE Final    Comment: (NOTE) The Xpert Xpress SARS-CoV-2/FLU/RSV plus assay is intended as an aid in the diagnosis of influenza from Nasopharyngeal swab specimens and should not be used as a sole basis for treatment. Nasal washings and aspirates are unacceptable for Xpert Xpress SARS-CoV-2/FLU/RSV testing.  Fact Sheet for Patients: BloggerCourse.com  Fact Sheet for Healthcare Providers: SeriousBroker.it  This test is not yet approved or cleared by the [place] FDA and has been authorized for detection and/or diagnosis of SARS-CoV-2 by FDA under an Emergency Use Authorization (EUA). This EUA will remain in effect (meaning this test can be used) for the duration of the COVID-19 declaration under Section 564(b)(1) of the Act, 21 U.S.C. section 360bbb-3(b)(1), unless the authorization is terminated or revoked.  Performed at [facility], [address], [place], [place] [address]      Radiology Studies: DG Chest 2 View  Result Date: 11/27/2020 CLINICAL DATA:  35 year old female with chest pain, nonproductive cough, shortness of breath and lower extremity swelling. Unintentional weight gain.  History of CHF. EXAM: CHEST - 2 VIEW COMPARISON:  Chest radiograph 10/10/2020 and earlier. FINDINGS: Seated AP and lateral views of the chest. Large body habitus. Lordotic AP view. Stable cardiomegaly and mediastinal contours. Other mediastinal contours are within normal limits. Visualized tracheal air column is within normal limits. Pulmonary vascularity appears stable since last month, no overt edema. No pneumothorax. No pleural effusion is evident. No consolidation. No acute osseous abnormality identified. Negative visible bowel gas pattern. IMPRESSION: Stable cardiomegaly and pulmonary vascular congestion since last month without overt edema. Electronically Signed   By: Odessa Fleming M.D.   On: 11/27/2020 07:22    Scheduled Meds:  aspirin EC  81 mg Oral Daily   atorvastatin  20 mg Oral Daily   carvedilol  6.25 mg Oral BID WC   enoxaparin (LOVENOX) injection  0.5 mg/kg Subcutaneous Q24H   furosemide  60 mg Intravenous Q12H   insulin aspart  0-15 Units Subcutaneous TID WC   insulin aspart  0-5 Units Subcutaneous QHS   insulin aspart protamine- aspart  8 Units Subcutaneous BID WC   iron polysaccharides  150 mg Oral Daily  Continuous Infusions:   LOS: 1 day   Time spent: 40 minutes. More than 50% of the time was spent in counseling/coordination of care  Arnetha Courser, MD [facility]  If 7PM-7AM, please contact night-coverage [email or web address]  11/28/2020, 1:51 PM   This record has been created using Conservation officer, historic buildings. Errors have been sought and corrected,but may not always be located. Such creation errors do not reflect on the standard of care.

## 2020-11-28 NOTE — Consult Note (Signed)
Cardiology Consult    Patient ID: Molly Howard MRN: [id number], DOB/AGE: [birth year]   Admit date: 11/27/2020 Date of Consult: 11/28/2020  Primary Physician: Patient, No Pcp Per (Inactive) Primary Cardiologist: Debbe Odea, MD Requesting Provider: Kathie Rhodes. Nelson Chimes, MD  Patient Profile    Molly Howard is a 35 y.o. female with a history of HFrEF, NICM, HTN, DMII, CKD II, GERD, microcytic anemia, obesity, medication nonadherarnce, and polysubstance abuse, who is being seen today for the evaluation of recurrent CHF at the request of Dr. Nelson Chimes.  Past Medical History   Past Medical History:  Diagnosis Date   Acid reflux    Chronic HFrEF (heart failure with reduced ejection fraction) (HCC)    a. 10/2019 Echo: EF 35-40%, GrII DD; b. 05/2020 Echo: EF 20-25%, glob HK; c. 10/2020 Echo: EF 30-35%, glob HK. GrII DD, Mildly red RV fxn. Mod TR.   CKD (chronic kidney disease), stage II    Diabetes mellitus (HCC)    H/O medication noncompliance    Hypertension    Microcytic anemia    NICM (nonischemic cardiomyopathy) (HCC)    a. 10/2019 Echo: EF 35-40%; b. 10/2019 MV: No ischemia. Small apical defect-->breast attenuation; c. 05/2020 Echo: EF 20-25%; d. 10/2020 Echo: EF 30-35%, glob HK. GrII DD, Mildly red RV fxn. Mod TR.   Obesity    Polysubstance abuse (HCC)     Past Surgical History:  Procedure Laterality Date   CHOLECYSTECTOMY     HERNIA REPAIR       Allergies  Allergies  Allergen Reactions   No [employer] Food Allergies Rash and Other (See Comments)    Lemons    History of Present Illness    Molly Howard is a 35 y.o. female with a history of HFrEF, NICM, HTN, DMII, CKD II, GERD, microcytic anemia, obesity, medication nonadherarnce, and polysubstance abuse.  She was initially dx w/ CHF when she presented in 10/2019 w/ dyspnea and volume overload.  Echocardiogram at that time showed EF of 35 to 40% with grade 2 diastolic dysfunction.  This was followed by stress testing which was  nonischemic, and she was treated for nonischemic cardiomyopathy.  Unfortunately, she has struggled with affording her medications.  In January 2022, she was admitted with COVID-19 and found to have an EF of 20 to 25%.  Following this, she was followed closely in cardiology and heart failure clinic and managed with torsemide 80 mg twice daily, metolazone 5 mg daily, carvedilol 6.25 mg twice daily, and lisinopril 5 mg daily.  She required readmission in early June secondary to worsening heart failure.  Her toxicology screen was cocaine positive at that time.  She was aggressively diuresed and subsequently discharged at 120.6 kg.  Echocardiogram during that admission showed an EF of 30 to 35% with global hypokinesis, grade 2 diastolic dysfunction, mildly reduced RV function, and moderate tricuspid regurgitation.  Discharge medications included carvedilol, losartan, metolazone, spironolactone, and torsemide.  Unfortunately, following discharge, Molly Howard did not pick up her medication because she could not afford it.  Attempts were made through both heart failure clinic and our office to assist her with obtaining medication for medication management clinic however, she was unable to obtain it/did not follow through.  Ms. Riege has had progressive bilateral lower extremity edema, increasing abdominal girth, weight gain, dyspnea, and orthopnea.  She presented to the [facility] ED on July 28 where she was noted to be markedly volume overloaded.  Admission ECG shows sinus tachycardia at 109 bpm.  BNP  was elevated at 1043.  Troponin was normal at 13.  Chest x-ray showed cardiomegaly vascular congestion.  Patient was seen by the medicine team and admitted and placed on intravenous Lasix.  She remains markedly volume overloaded and diffusely edematous.  She notes that her abdomen and legs have been very uncomfortable for some time.  She does not experience dyspnea at rest.  She denies any chest pain at this time though was  occasionally noting this at home previously.  Inpatient Medications     aspirin EC  81 mg Oral Daily   atorvastatin  20 mg Oral Daily   carvedilol  6.25 mg Oral BID WC   enoxaparin (LOVENOX) injection  0.5 mg/kg Subcutaneous Q24H   furosemide  80 mg Intravenous BID WC   insulin aspart  0-15 Units Subcutaneous TID WC   insulin aspart  0-5 Units Subcutaneous QHS   insulin aspart protamine- aspart  8 Units Subcutaneous BID WC   iron polysaccharides  150 mg Oral Daily   sacubitril-valsartan  1 tablet Oral BID    Family History    Family History  Problem Relation Age of Onset   Heart failure Mother        a. onset in her 59s   Diabetes Mother    Hypertension Father    Diabetes Father    She indicated that the status of her mother is unknown. She indicated that the status of her father is unknown.   Social History    Social History   Socioeconomic History   Marital status: Single    Spouse name: Not on file   Number of children: Not on file   Years of education: Not on file   Highest education level: Not on file  Occupational History   Not on file  Tobacco Use   Smoking status: Former    Packs/day: 0.50    Types: Cigarettes   Smokeless tobacco: Never  Vaping Use   Vaping Use: Never used  Substance and Sexual Activity   Alcohol use: Not Currently    Comment: ~ 4 shots/day on weekends only   Drug use: Not Currently    Types: Cocaine    Comment: Last day of use was prior to June 2021 hosp.visit.    Sexual activity: Not on file  Other Topics Concern   Not on file  Social History Narrative   Not on file   Social Determinants of Health   Financial Resource Strain: High Risk   Difficulty of Paying Living Expenses: Hard  Food Insecurity: Food Insecurity Present   Worried About Running Out of Food in the Last Year: Often true   Barista in the Last Year: Often true  Transportation Needs: Personal assistant (Medical): Yes    Lack of Transportation (Non-Medical): Yes  Physical Activity: Not on file  Stress: Not on file  Social Connections: Not on file  Intimate Partner Violence: Not on file     Review of Systems    General:  No chills, fever, night sweats or weight changes.  Cardiovascular:  +++ Occasional chest pain, +++ dyspnea on exertion, +++ massive edema involving her legs, buttocks, abdomen, and lower back, +++ orthopnea, no palpitations, +++ paroxysmal nocturnal dyspnea. Dermatological: No rash, lesions/masses Respiratory: No cough, +++ dyspnea Urologic: No hematuria, dysuria Abdominal:   No nausea, vomiting, diarrhea, bright red blood per rectum, melena, or hematemesis Neurologic:  No visual changes, wkns, changes in mental status. All other  systems reviewed and are otherwise negative except as noted above.  Physical Exam    Blood pressure (!) 137/104, pulse 99, temperature 97.7 F (36.5 C), temperature source Oral, resp. rate 18, height 5\' 7"  (1.702 m), weight (!) 194.5 kg, SpO2 100 %.  General: Pleasant, NAD Psych: Normal affect. Neuro: Alert and oriented X 3. Moves all extremities spontaneously. HEENT: Normal  Neck: Supple.  No bruits.  JVD to jaw Lungs:  Resp regular and unlabored, diminished at bases. Heart: RRR, distant, no s3, s4, or murmurs. Abdomen: Obese, firm with 2+ woody edema circumferentially.  BS + x 4.  Extremities: No clubbing, cyanosis.  2-3+ woody edema extending from her lower back to her feet bilaterally. DP/PT 1+, Radials 2+ and equal bilaterally.  Labs    Cardiac Enzymes Recent Labs  Lab 11/27/20 0623  TROPONINIHS 13      Lab Results  Component Value Date   WBC 6.3 11/27/2020   HGB 10.0 (L) 11/27/2020   HCT 33.5 (L) 11/27/2020   MCV 85.9 11/27/2020   PLT 488 (H) 11/27/2020    Recent Labs  Lab 11/28/20 0505  NA 136  K 4.4  CL 94*  CO2 32  BUN 32*  CREATININE 1.19*  CALCIUM 7.9*  GLUCOSE 98   Lab Results  Component Value Date   CHOL 212 (H)  05/21/2020   HDL 35 (L) 05/21/2020   LDLCALC 153 (H) 05/21/2020   TRIG 121 05/21/2020     Radiology Studies    DG Chest 2 View  Result Date: 11/27/2020 CLINICAL DATA:  35 year old female with chest pain, nonproductive cough, shortness of breath and lower extremity swelling. Unintentional weight gain. History of CHF. EXAM: CHEST - 2 VIEW COMPARISON:  Chest radiograph 10/10/2020 and earlier. FINDINGS: Seated AP and lateral views of the chest. Large body habitus. Lordotic AP view. Stable cardiomegaly and mediastinal contours. Other mediastinal contours are within normal limits. Visualized tracheal air column is within normal limits. Pulmonary vascularity appears stable since last month, no overt edema. No pneumothorax. No pleural effusion is evident. No consolidation. No acute osseous abnormality identified. Negative visible bowel gas pattern. IMPRESSION: Stable cardiomegaly and pulmonary vascular congestion since last month without overt edema. Electronically Signed   By: 12/10/2020 M.D.   On: 11/27/2020 07:22    ECG & Cardiac Imaging    Sinus tachycardia, 109, RAD, RVH - personally reviewed.  Assessment & Plan    1.  Acute on chronic combined systolic and diastolic congestive heart failure/nonischemic cardiomyopathy: Patient admitted July 28 with progressive dyspnea, lower extremity edema, increasing abdominal girth, weight gain, PND, and orthopnea.  She is massively volume overloaded with woody edema extending from her toes to her lower back.  On previous admission, she left after being told that she likely still had 30 pounds of fluid and then was not able to pick up her medications.  We discussed that she will likely require intravenous diuresis for a week or more and depending upon renal response, may require inotropic support as well.  I asked her to be patient with July 30 as this is likely to be a long process and she expressed understanding and said simply that she "wants to get better."  I am going  to escalate her Lasix to 80 mg IV twice daily.  Continue carvedilol.  She was not previously able to afford even losartan, we will go ahead and add Entresto with a plan to get her 30 days for free at discharge and enrollment in  the assistance program.  Provided that renal function and pressure stable, will plan to add back spironolactone and also consider an SGLT2 inhibitor (with assistance program) prior to discharge.  2.  Essential hypertension: Stable.  Continue beta-blocker.  Adding Entresto as above.  3.  Type 2 diabetes mellitus: Management per primary team.  We will look to add SGLT2 inhibitor and try and enroll in assistance program prior to discharge.  4.  Stage II chronic kidney disease: Creatinine mildly elevated at 1.19 likely in the setting of significant congestion and poor forward flow.  Follow with diuresis.  5.  Microcytic anemia: Stable.  6.  Polysubstance abuse: She has not used cocaine since her last admission.  She has 1 vodka drink a week.  She denies tobacco abuse since January.  7.  Medication nonadherence: Patient notes an inability to afford medications.  Our office and also the heart failure clinic is made extensive efforts to try and get her medicine through the med management clinic and for some reason, this is followed through.  She does not have insurance but likely qualifies for Medicaid.  She will require social worker assistance in moving through that process.  We will also try to get her enrolled in medication assistance programs as necessary.  Signed, Nicolasa Ducking, NP 11/28/2020, 5:29 PM  For questions or updates, please contact   Please consult [email or web address] for contact info under Cardiology/STEMI.

## 2020-11-28 NOTE — Plan of Care (Signed)
  Problem: Education: Goal: Knowledge of General Education information will improve Description: Including pain rating scale, medication(s)/side effects and non-pharmacologic comfort measures 11/28/2020 1514 by Ansel Bong, RN Outcome: Progressing 11/28/2020 1514 by Ansel Bong, RN Outcome: Progressing   Problem: Health Behavior/Discharge Planning: Goal: Ability to manage health-related needs will improve 11/28/2020 1514 by Ansel Bong, RN Outcome: Progressing 11/28/2020 1514 by Ansel Bong, RN Outcome: Progressing   Problem: Clinical Measurements: Goal: Ability to maintain clinical measurements within normal limits will improve 11/28/2020 1514 by Ansel Bong, RN Outcome: Progressing 11/28/2020 1514 by Ansel Bong, RN Outcome: Progressing Goal: Will remain free from infection 11/28/2020 1514 by Ansel Bong, RN Outcome: Progressing 11/28/2020 1514 by Ansel Bong, RN Outcome: Progressing Goal: Diagnostic test results will improve 11/28/2020 1514 by Ansel Bong, RN Outcome: Progressing 11/28/2020 1514 by Ansel Bong, RN Outcome: Progressing Goal: Respiratory complications will improve 11/28/2020 1514 by Ansel Bong, RN Outcome: Progressing 11/28/2020 1514 by Ansel Bong, RN Outcome: Progressing Goal: Cardiovascular complication will be avoided 11/28/2020 1514 by Ansel Bong, RN Outcome: Progressing 11/28/2020 1514 by Ansel Bong, RN Outcome: Progressing   Problem: Activity: Goal: Risk for activity intolerance will decrease 11/28/2020 1514 by Ansel Bong, RN Outcome: Progressing 11/28/2020 1514 by Ansel Bong, RN Outcome: Progressing   Problem: Nutrition: Goal: Adequate nutrition will be maintained 11/28/2020 1514 by Ansel Bong, RN Outcome: Progressing 11/28/2020 1514 by Ansel Bong, RN Outcome: Progressing   Problem: Coping: Goal: Level of anxiety will decrease 11/28/2020 1514 by Ansel Bong, RN Outcome:  Progressing 11/28/2020 1514 by Ansel Bong, RN Outcome: Progressing   Problem: Elimination: Goal: Will not experience complications related to bowel motility 11/28/2020 1514 by Ansel Bong, RN Outcome: Progressing 11/28/2020 1514 by Ansel Bong, RN Outcome: Progressing Goal: Will not experience complications related to urinary retention 11/28/2020 1514 by Ansel Bong, RN Outcome: Progressing 11/28/2020 1514 by Ansel Bong, RN Outcome: Progressing   Problem: Pain Managment: Goal: General experience of comfort will improve 11/28/2020 1514 by Ansel Bong, RN Outcome: Progressing 11/28/2020 1514 by Ansel Bong, RN Outcome: Progressing   Problem: Safety: Goal: Ability to remain free from injury will improve 11/28/2020 1514 by Ansel Bong, RN Outcome: Progressing 11/28/2020 1514 by Ansel Bong, RN Outcome: Progressing   Problem: Skin Integrity: Goal: Risk for impaired skin integrity will decrease 11/28/2020 1514 by Ansel Bong, RN Outcome: Progressing 11/28/2020 1514 by Ansel Bong, RN Outcome: Progressing

## 2020-11-28 NOTE — Progress Notes (Addendum)
Heart Failure Nurse Navigator Note  HFrEF 30-35%  Met with patient today, she is currently sitting up in bed on room air.  States that she still not back to her baseline.  I asked her again to tell me how she takes care of her self at home and she went on to list weighing daily and taking her medications as ordered.  I told her that I had called the pharmacy and knew that she had not picked up her prescriptions, she stated   the reason that she had not picked up the prescriptions as she claimed that the $4 prescriptions (3 of those=$12) she was being charged $45.  She could not afford that  Had a long talk with her about being honest with Tina and myself, as if we had known that she was having difficulties with getting the prescription there were ways that we could have helped her out.  Also made her aware if this continues with her not taking her medications and taking care of her self that she is not going to live long past 35 years old.  She voices understanding.  Also talked with her about getting the needed paper work to the medication clinic, she states that her mother found her W2 and she still has the application, she told me she would see that was turned in so she could get her meds.  She was given a magnet with the outpatient heart failure clinic's number on it again reiterated that it is important that she be honest with us as we are here to help her as much as we can.  Spoke with Dr. Amin in progression this AM, will order UA drug screen.  As patient had denied any further drug use.  Will continue to follow along.  Jimsey Shaffer RN [facility] 

## 2020-11-29 DIAGNOSIS — N182 Chronic kidney disease, stage 2 (mild): Secondary | ICD-10-CM

## 2020-11-29 DIAGNOSIS — E782 Mixed hyperlipidemia: Secondary | ICD-10-CM

## 2020-11-29 DIAGNOSIS — R601 Generalized edema: Secondary | ICD-10-CM

## 2020-11-29 DIAGNOSIS — D509 Iron deficiency anemia, unspecified: Secondary | ICD-10-CM

## 2020-11-29 DIAGNOSIS — E1129 Type 2 diabetes mellitus with other diabetic kidney complication: Secondary | ICD-10-CM

## 2020-11-29 DIAGNOSIS — I10 Essential (primary) hypertension: Secondary | ICD-10-CM

## 2020-11-29 DIAGNOSIS — F1410 Cocaine abuse, uncomplicated: Secondary | ICD-10-CM

## 2020-11-29 LAB — GLUCOSE, CAPILLARY
Glucose-Capillary: 119 mg/dL — ABNORMAL HIGH (ref 70–99)
Glucose-Capillary: 168 mg/dL — ABNORMAL HIGH (ref 70–99)
Glucose-Capillary: 195 mg/dL — ABNORMAL HIGH (ref 70–99)
Glucose-Capillary: 188 mg/dL — ABNORMAL HIGH (ref 70–99)

## 2020-11-29 LAB — BASIC METABOLIC PANEL
Anion gap: 11 (ref 5–15)
BUN: 32 mg/dL — ABNORMAL HIGH (ref 6–20)
CO2: 28 mmol/L (ref 22–32)
Calcium: 7.6 mg/dL — ABNORMAL LOW (ref 8.9–10.3)
Chloride: 93 mmol/L — ABNORMAL LOW (ref 98–111)
Creatinine, Ser: 1.07 mg/dL — ABNORMAL HIGH (ref 0.44–1.00)
GFR, Estimated: >60 mL/min (ref >60)
Glucose, Bld: 144 mg/dL — ABNORMAL HIGH (ref 70–99)
Potassium: 4.3 mmol/L (ref 3.5–5.1)
Sodium: 132 mmol/L — ABNORMAL LOW (ref 135–145)

## 2020-11-29 MED ORDER — FUROSEMIDE 10 MG/ML IJ SOLN
80.0000 mg | Freq: Three times a day (TID) | INTRAMUSCULAR | Status: DC
  Administered 2020-11-29 – 2020-12-06 (×23): 80 mg via INTRAVENOUS
  Filled 2020-11-29 (×23): qty 8

## 2020-11-29 MED ORDER — DAPAGLIFLOZIN PROPANEDIOL 5 MG PO TABS
10.0000 mg | ORAL_TABLET | Freq: Every day | ORAL | Status: DC
  Administered 2020-11-29 – 2020-12-08 (×10): 10 mg via ORAL
  Filled 2020-11-29 (×10): qty 2

## 2020-11-29 MED ORDER — METOLAZONE 2.5 MG PO TABS: 2.5000 mg | ORAL_TABLET | Freq: Every day | ORAL | Status: DC

## 2020-11-29 MED ORDER — METOLAZONE 5 MG PO TABS
5.0000 mg | ORAL_TABLET | Freq: Every day | ORAL | Status: DC
  Administered 2020-11-29 – 2020-12-07 (×9): 5 mg via ORAL
  Filled 2020-11-29 (×10): qty 1

## 2020-11-29 NOTE — Progress Notes (Signed)
PROGRESS NOTE    Molly Howard  LJQ:[id number] DOB: 06-04-1985 DOA: 11/27/2020 PCP: Patient, No Pcp Per (Inactive)   Brief Narrative: Taken from H&P. Molly Howard is a 35 y.o. female with medical history significant of nonischemic cardiomyopathy and CHF with EF 30 to 35%, hypertension, hyperlipidemia, diabetes mellitus, GERD, anemia, former smoker, CKD-2, polysubstance abuse, who presents with shortness breath.   Patient states that her shortness breath has been going on for more than 1 week, which has been progressively worsening.  Patient also has dry cough, worsening bilateral leg edema, generalized weakness, approximately 40 pounds of weight gain in the past 2 weeks.  She has orthopnea.  Her shortness breath is worse when laying flat.  Denies chest pain, fever or chills.  Found to have BNP of 1043, COVID PCR negative, mild AKI and chest x-ray positive for cardiomegaly and vascular congestion.  Patient admitted for acute on chronic HFrEF with volume overload and started on IV diuresis.  Patient will need medication assistance on discharge.  Subjective: Patient continues to have some shortness of breath, belly still uncomfortable and swollen.  Assessment & Plan:   Principal Problem:   Acute on chronic combined systolic and diastolic CHF (congestive heart failure) (HCC) Active Problems:   Essential hypertension   HLD (hyperlipidemia)   Type II diabetes mellitus with renal manifestations (HCC)   Chronic kidney disease (CKD), stage II (mild)   Iron deficiency anemia  Acute on chronic combined systolic and diastolic CHF (congestive heart failure) (HCC): 2D echo on 10/11/2020 showed EF of 30-35% with grade 2 diastolic dysfunction.  Patient has orthopnea, 40 pounds of weight gain, elevated BNP 1043, vascular congestion on chest x-ray, clinically consistent with CHF exacerbation. Recent admissions due to similar reasons, saw cardiology on 11/13/2020 and was advised to take her medications.  Apparently she has not picked up her medications from pharmacy yet. Not much urinary output recorded but according to patient she is passing good amount of urine. -Cardiology consult -Cardiology increase the dose of Lasix to 80 mg 3 times a day. -Added metolazone -Strict intake and output -Daily weight and BMP  Essential hypertension.  Patient was not taking her blood pressure medications either. Blood pressure currently within goal. -Continue with Coreg along with Lasix. -Holding Cozaar due to AKI-can be resumed if needed as renal function improved.  HLD (hyperlipidemia): Patient is not taking Lipitor currently. -Continue Lipitor which was restarted during this admission  Type II diabetes mellitus with renal manifestations [facility]): Recent A1c 11.3, poorly controlled.  Patient is not taking 70/30 insulin 10 unit twice daily currently -70/30 insulin 8 mg twice daily -Sliding scale insulin   Chronic kidney disease (CKD), stage II (mild): Baseline creatinine is~1.0.  On admission it was 1.25, continue to improve, at 1.07 today -Keep holding Cozaar until blood pressure started trending up. -Continue with IV diuresis -Monitor renal function -Nephrotoxins  Iron deficiency anemia: Stable hemoglobin, 10.0 (9.8 on 10/18/2020).  Patient is not taking iron supplement currently -Restarted iron supplement on admission.  Stage III obesity. Estimated body mass index is 61.84 kg/m as calculated from the following:   Height as of this encounter: 5\' 7"  (1.702 m).   Weight as of this encounter: 179.1 kg.  This will complicate overall prognosis..  Objective: Vitals:   11/29/20 0434 11/29/20 0828 11/29/20 1209 11/29/20 1604  BP: 108/82 110/84 109/80 (!) 125/91  Pulse: 91 92 96 97  Resp: 16 17 17 17   Temp: (!) 97.5 F (36.4 C) 98 F (  36.7 C) 98.6 F (37 C) 97.9 F (36.6 C)  TempSrc: Oral  Oral   SpO2: 98% 95% 100% 98%  Weight: (!) 179.1 kg     Height:        Intake/Output Summary (Last 24  hours) at 11/29/2020 1648 Last data filed at 11/29/2020 1500 Gross per 24 hour  Intake 660 ml  Output 2300 ml  Net -1640 ml    Filed Weights   11/27/20 1820 11/28/20 0544 11/29/20 0434  Weight: (!) 193.4 kg (!) 194.5 kg (!) 179.1 kg    Examination:  General.  Morbidly obese lady, in no acute distress. Pulmonary.  Lungs clear bilaterally, normal respiratory effort. CV.  Regular rate and rhythm, no JVD, rub or murmur. Abdomen.  Soft, nontender, nondistended, BS positive. CNS.  Alert and oriented x3.  No focal neurologic deficit. Extremities.  No edema, no cyanosis, pulses intact and symmetrical. Psychiatry.  Judgment and insight appears normal.   DVT prophylaxis: Lovenox Code Status: Full Family Communication: Discussed with patient Disposition Plan:  Status is: Inpatient  Remains inpatient appropriate because:Inpatient level of care appropriate due to severity of illness  Dispo: The patient is from: Home              Anticipated d/c is to: Home              Patient currently is not medically stable to d/c.   Difficult to place patient No              Level of care: Progressive Cardiac  All the records are reviewed and case discussed with Care Management/Social Worker. Management plans discussed with the patient, nursing and they are in agreement.  Consultants:  Cardiology  Procedures:  Antimicrobials:   Data Reviewed: I have personally reviewed following labs and imaging studies  CBC: Recent Labs  Lab 11/27/20 0623  WBC 6.3  HGB 10.0*  HCT 33.5*  MCV 85.9  PLT 488*    Basic Metabolic Panel: Recent Labs  Lab 11/27/20 0623 11/28/20 0505 11/29/20 0611  NA 133* 136 132*  K 4.9 4.4 4.3  CL 93* 94* 93*  CO2 30 32 28  GLUCOSE 339* 98 144*  BUN 30* 32* 32*  CREATININE 1.25* 1.19* 1.07*  CALCIUM 7.8* 7.9* 7.6*  MG  --  1.6*  --     GFR: Estimated Creatinine Clearance: 124.6 mL/min (A) (by C-G formula based on SCr of 1.07 mg/dL (H)). Liver Function  Tests: No results for input(s): AST, ALT, ALKPHOS, BILITOT, PROT, ALBUMIN in the last 168 hours. No results for input(s): LIPASE, AMYLASE in the last 168 hours. No results for input(s): AMMONIA in the last 168 hours. Coagulation Profile: No results for input(s): INR, PROTIME in the last 168 hours. Cardiac Enzymes: No results for input(s): CKTOTAL, CKMB, CKMBINDEX, TROPONINI in the last 168 hours. BNP (last 3 results) No results for input(s): PROBNP in the last 8760 hours. HbA1C: No results for input(s): HGBA1C in the last 72 hours. CBG: Recent Labs  Lab 11/28/20 1716 11/28/20 2018 11/29/20 0829 11/29/20 1210 11/29/20 1606  GLUCAP 153* 202* 119* 168* 195*    Lipid Profile: No results for input(s): CHOL, HDL, LDLCALC, TRIG, CHOLHDL, LDLDIRECT in the last 72 hours. Thyroid Function Tests: No results for input(s): TSH, T4TOTAL, FREET4, T3FREE, THYROIDAB in the last 72 hours. Anemia Panel: No results for input(s): VITAMINB12, FOLATE, FERRITIN, TIBC, IRON, RETICCTPCT in the last 72 hours. Sepsis Labs: No results for input(s): PROCALCITON, LATICACIDVEN in the last  168 hours.  Recent Results (from the past 240 hour(s))  Resp Panel by RT-PCR (Flu A&B, Covid) Nasopharyngeal Swab     Status: None   Collection Time: 11/27/20  8:02 AM   Specimen: Nasopharyngeal Swab; Nasopharyngeal(NP) swabs in vial transport medium  Result Value Ref Range Status   SARS Coronavirus 2 by RT PCR NEGATIVE NEGATIVE Final    Comment: (NOTE) SARS-CoV-2 target nucleic acids are NOT DETECTED.  The SARS-CoV-2 RNA is generally detectable in upper respiratory specimens during the acute phase of infection. The lowest concentration of SARS-CoV-2 viral copies this assay can detect is 138 copies/mL. A negative result does not preclude SARS-Cov-2 infection and should not be used as the sole basis for treatment or other patient management decisions. A negative result may occur with  improper specimen  collection/handling, submission of specimen other than nasopharyngeal swab, presence of viral mutation(s) within the areas targeted by this assay, and inadequate number of viral copies(<138 copies/mL). A negative result must be combined with clinical observations, patient history, and epidemiological information. The expected result is Negative.  Fact Sheet for Patients:  BloggerCourse.com  Fact Sheet for Healthcare Providers:  SeriousBroker.it  This test is no t yet approved or cleared by the [place] FDA and  has been authorized for detection and/or diagnosis of SARS-CoV-2 by FDA under an Emergency Use Authorization (EUA). This EUA will remain  in effect (meaning this test can be used) for the duration of the COVID-19 declaration under Section 564(b)(1) of the Act, 21 U.S.C.section 360bbb-3(b)(1), unless the authorization is terminated  or revoked sooner.       Influenza A by PCR NEGATIVE NEGATIVE Final   Influenza B by PCR NEGATIVE NEGATIVE Final    Comment: (NOTE) The Xpert Xpress SARS-CoV-2/FLU/RSV plus assay is intended as an aid in the diagnosis of influenza from Nasopharyngeal swab specimens and should not be used as a sole basis for treatment. Nasal washings and aspirates are unacceptable for Xpert Xpress SARS-CoV-2/FLU/RSV testing.  Fact Sheet for Patients: BloggerCourse.com  Fact Sheet for Healthcare Providers: SeriousBroker.it  This test is not yet approved or cleared by the [place] FDA and has been authorized for detection and/or diagnosis of SARS-CoV-2 by FDA under an Emergency Use Authorization (EUA). This EUA will remain in effect (meaning this test can be used) for the duration of the COVID-19 declaration under Section 564(b)(1) of the Act, 21 U.S.C. section 360bbb-3(b)(1), unless the authorization is terminated or revoked.  Performed at [facility], [address], [place], [place] [address]       Radiology Studies: No results found.  Scheduled Meds:  atorvastatin  20 mg Oral Daily   carvedilol  6.25 mg Oral BID WC   dapagliflozin propanediol  10 mg Oral Daily   enoxaparin (LOVENOX) injection  0.5 mg/kg Subcutaneous Q24H   furosemide  80 mg Intravenous TID   insulin aspart  0-15 Units Subcutaneous TID WC   insulin aspart  0-5 Units Subcutaneous QHS   insulin aspart protamine- aspart  8 Units Subcutaneous BID WC   iron polysaccharides  150 mg Oral Daily   metolazone  5 mg Oral Daily   sacubitril-valsartan  1 tablet Oral BID   Continuous Infusions:   LOS: 2 days   Time spent: 35 minutes. More than 50% of the time was spent in counseling/coordination of care  Arnetha Courser, MD [facility]  If 7PM-7AM, please contact night-coverage [email or web address]  11/29/2020, 4:48 PM   This record has been created using  [employer]. Errors have been sought and corrected,but may not always be located. Such creation errors do not reflect on the standard of care.

## 2020-11-29 NOTE — Plan of Care (Signed)
  Problem: Education: Goal: Knowledge of General Education information will improve Description: Including pain rating scale, medication(s)/side effects and non-pharmacologic comfort measures 11/29/2020 1540 by Ansel Bong, RN Outcome: Progressing 11/29/2020 1540 by Ansel Bong, RN Outcome: Progressing   Problem: Health Behavior/Discharge Planning: Goal: Ability to manage health-related needs will improve 11/29/2020 1540 by Ansel Bong, RN Outcome: Progressing 11/29/2020 1540 by Ansel Bong, RN Outcome: Progressing   Problem: Clinical Measurements: Goal: Ability to maintain clinical measurements within normal limits will improve 11/29/2020 1540 by Ansel Bong, RN Outcome: Progressing 11/29/2020 1540 by Ansel Bong, RN Outcome: Progressing Goal: Will remain free from infection 11/29/2020 1540 by Ansel Bong, RN Outcome: Progressing 11/29/2020 1540 by Ansel Bong, RN Outcome: Progressing Goal: Diagnostic test results will improve 11/29/2020 1540 by Ansel Bong, RN Outcome: Progressing 11/29/2020 1540 by Ansel Bong, RN Outcome: Progressing Goal: Respiratory complications will improve 11/29/2020 1540 by Ansel Bong, RN Outcome: Progressing 11/29/2020 1540 by Ansel Bong, RN Outcome: Progressing Goal: Cardiovascular complication will be avoided 11/29/2020 1540 by Ansel Bong, RN Outcome: Progressing 11/29/2020 1540 by Ansel Bong, RN Outcome: Progressing   Problem: Activity: Goal: Risk for activity intolerance will decrease 11/29/2020 1540 by Ansel Bong, RN Outcome: Progressing 11/29/2020 1540 by Ansel Bong, RN Outcome: Progressing   Problem: Nutrition: Goal: Adequate nutrition will be maintained 11/29/2020 1540 by Ansel Bong, RN Outcome: Progressing 11/29/2020 1540 by Ansel Bong, RN Outcome: Progressing   Problem: Coping: Goal: Level of anxiety will decrease 11/29/2020 1540 by Ansel Bong, RN Outcome:  Progressing 11/29/2020 1540 by Ansel Bong, RN Outcome: Progressing   Problem: Elimination: Goal: Will not experience complications related to bowel motility 11/29/2020 1540 by Ansel Bong, RN Outcome: Progressing 11/29/2020 1540 by Ansel Bong, RN Outcome: Progressing Goal: Will not experience complications related to urinary retention 11/29/2020 1540 by Ansel Bong, RN Outcome: Progressing 11/29/2020 1540 by Ansel Bong, RN Outcome: Progressing   Problem: Pain Managment: Goal: General experience of comfort will improve 11/29/2020 1540 by Ansel Bong, RN Outcome: Progressing 11/29/2020 1540 by Ansel Bong, RN Outcome: Progressing   Problem: Safety: Goal: Ability to remain free from injury will improve 11/29/2020 1540 by Ansel Bong, RN Outcome: Progressing 11/29/2020 1540 by Ansel Bong, RN Outcome: Progressing   Problem: Skin Integrity: Goal: Risk for impaired skin integrity will decrease 11/29/2020 1540 by Ansel Bong, RN Outcome: Progressing 11/29/2020 1540 by Ansel Bong, RN Outcome: Progressing

## 2020-11-29 NOTE — Progress Notes (Signed)
Progress Note  Patient Name: Molly Howard Date of Encounter: 11/29/2020  Primary Cardiologist: Debbe Odea, MD  Subjective   No significant change in status - still feels that abd/legs are very tight.  Good UO.  No chest pain or dyspnea @ rest.  Inpatient Medications    Scheduled Meds:  atorvastatin  20 mg Oral Daily   carvedilol  6.25 mg Oral BID WC   enoxaparin (LOVENOX) injection  0.5 mg/kg Subcutaneous Q24H   furosemide  80 mg Intravenous BID WC   insulin aspart  0-15 Units Subcutaneous TID WC   insulin aspart  0-5 Units Subcutaneous QHS   insulin aspart protamine- aspart  8 Units Subcutaneous BID WC   iron polysaccharides  150 mg Oral Daily   sacubitril-valsartan  1 tablet Oral BID   Continuous Infusions:  PRN Meds: acetaminophen, albuterol, chlorpheniramine-HYDROcodone, dextromethorphan-guaiFENesin, hydrALAZINE, ondansetron (ZOFRAN) IV   Vital Signs    Vitals:   11/28/20 1235 11/28/20 1719 11/28/20 1917 11/29/20 0434  BP: 119/87 (!) 137/104 110/82 108/82  Pulse: 93 99 98 91  Resp: 17 18 17 16   Temp: 98.8 F (37.1 C) 97.7 F (36.5 C) (!) 97.5 F (36.4 C) (!) 97.5 F (36.4 C)  TempSrc: Oral Oral Oral Oral  SpO2: 95% 100% 95% 98%  Weight:    (!) 179.1 kg  Height:        Intake/Output Summary (Last 24 hours) at 11/29/2020 0824 Last data filed at 11/29/2020 0700 Gross per 24 hour  Intake 350 ml  Output 1700 ml  Net -1350 ml   Filed Weights   11/27/20 1820 11/28/20 0544 11/29/20 0434  Weight: (!) 193.4 kg (!) 194.5 kg (!) 179.1 kg    Physical Exam   GEN: Obese, in no acute distress.  HEENT: Grossly normal.  Neck: Supple, JVD to jaw.  No carotid bruits, or masses. Cardiac: RRR, distant, no murmurs, rubs, or gallops. No clubbing, cyanosis.  2+ bilat woody LE edema extending to abd/mid-back.  Radials 2+, DP/PT 1+ and equal bilaterally.  Respiratory:  Respirations regular and unlabored, clear to auscultation bilaterally. GI: Obese, firm w/ 2+  circumferential woody edema.  Nontender, BS + x 4. MS: no deformity or atrophy. Skin: warm and dry, no rash. Neuro:  Strength and sensation are intact. Psych: AAOx3.  Normal affect.  Labs    Chemistry Recent Labs  Lab 11/27/20 0623 11/28/20 0505 11/29/20 0611  NA 133* 136 132*  K 4.9 4.4 4.3  CL 93* 94* 93*  CO2 30 32 28  GLUCOSE 339* 98 144*  BUN 30* 32* 32*  CREATININE 1.25* 1.19* 1.07*  CALCIUM 7.8* 7.9* 7.6*  GFRNONAA 57* >60 >60  ANIONGAP 10 10 11      Hematology Recent Labs  Lab 11/27/20 0623  WBC 6.3  RBC 3.90  HGB 10.0*  HCT 33.5*  MCV 85.9  MCH 25.6*  MCHC 29.9*  RDW 18.2*  PLT 488*    Cardiac Enzymes  Recent Labs  Lab 11/27/20 0623  TROPONINIHS 13      BNP Recent Labs  Lab 11/27/20 0623  BNP 1,043.9*    Lipids  Lab Results  Component Value Date   CHOL 212 (H) 05/21/2020   HDL 35 (L) 05/21/2020   LDLCALC 153 (H) 05/21/2020   TRIG 121 05/21/2020   CHOLHDL 6.1 05/21/2020    HbA1c  Lab Results  Component Value Date   HGBA1C 11.3 (H) 10/11/2020    Radiology    DG Chest 2 View  Result Date: 11/27/2020 CLINICAL DATA:  35 year old female with chest pain, nonproductive cough, shortness of breath and lower extremity swelling. Unintentional weight gain. History of CHF. EXAM: CHEST - 2 VIEW COMPARISON:  Chest radiograph 10/10/2020 and earlier. FINDINGS: Seated AP and lateral views of the chest. Large body habitus. Lordotic AP view. Stable cardiomegaly and mediastinal contours. Other mediastinal contours are within normal limits. Visualized tracheal air column is within normal limits. Pulmonary vascularity appears stable since last month, no overt edema. No pneumothorax. No pleural effusion is evident. No consolidation. No acute osseous abnormality identified. Negative visible bowel gas pattern. IMPRESSION: Stable cardiomegaly and pulmonary vascular congestion since last month without overt edema. Electronically Signed   By: Odessa Fleming M.D.   On:  11/27/2020 07:22    Telemetry    RSR, 90's, 8 beats NSVT - Personally Reviewed  Cardiac Studies   2D Echocardiogram 6.2022  1. Left ventricular ejection fraction, by estimation, is 30 to 35%. The  left ventricle has moderate to severely decreased function. The left  ventricle demonstrates global hypokinesis. Left ventricular diastolic  parameters are consistent with Grade II  diastolic dysfunction (pseudonormalization).   2. Right ventricular systolic function is mildly reduced. The right  ventricular size is normal.   3. A small pericardial effusion is present.   4. The mitral valve is normal in structure. No evidence of mitral valve  regurgitation.   5. Tricuspid valve regurgitation is moderate.   6. The aortic valve is tricuspid. Aortic valve regurgitation is not  visualized.   Patient Profile     35 y.o. female with a history of HFrEF, NICM, HTN, DMII, CKD II, GERD, microcytic anemia, obesity, medication nonadherarnce, and polysubstance abuse, who was admitted 11/27/2020 with worsening dyspnea and volume overload in the setting of noncompliance.  Assessment & Plan    1.  Acute on chronic combined systolic/diastolic CHF/NICM:  Readmitted 7/28 w/ massive volume overload and progressive dyspnea/orthopnea.  EF 30-35% by echo during June admission.  Minus 1.35L yesterday and 1.2L since admission.  Still markedly volume overloaded w/ 2+ woody edema from her feet to her abd/mid-back.  Renal fxn stable.  Cont ? blocker, entresto, and current lasix dose (80 IV bid).  BPs low 100's to 1-teens.  Will hold off on adding spiro for now.  Will need TOC assistance for outpt resources/med mgmt/med assistance programs.  2.  Essential HTN:  stable.  3.  DMII:  Per primary team.  Will add farxiga for n0w - will need med assistance.  4.  CKD II:  creat stable.  Follow.  5.  Microcytic anemia:  stable on admission.  6.  Polysubstance abuse:  No cocaine since June.  1 vodka drink/week.  No tob  since Jan.  Complete cessation encouraged.  7.  Medication nonadherence:  Will need TOC assistance - ? Medicaid eligibility.  Signed, Nicolasa Ducking, NP  11/29/2020, 8:24 AM    For questions or updates, please contact   Please consult [email or web address] for contact info under Cardiology/STEMI.

## 2020-11-30 LAB — BASIC METABOLIC PANEL
Anion gap: 12 (ref 5–15)
BUN: 30 mg/dL — ABNORMAL HIGH (ref 6–20)
CO2: 32 mmol/L (ref 22–32)
Calcium: 8 mg/dL — ABNORMAL LOW (ref 8.9–10.3)
Chloride: 93 mmol/L — ABNORMAL LOW (ref 98–111)
Creatinine, Ser: 1.05 mg/dL — ABNORMAL HIGH (ref 0.44–1.00)
GFR, Estimated: >60 mL/min (ref >60)
Glucose, Bld: 99 mg/dL (ref 70–99)
Potassium: 4.2 mmol/L (ref 3.5–5.1)
Sodium: 137 mmol/L (ref 135–145)

## 2020-11-30 LAB — GLUCOSE, CAPILLARY
Glucose-Capillary: 110 mg/dL — ABNORMAL HIGH (ref 70–99)
Glucose-Capillary: 174 mg/dL — ABNORMAL HIGH (ref 70–99)
Glucose-Capillary: 205 mg/dL — ABNORMAL HIGH (ref 70–99)
Glucose-Capillary: 273 mg/dL — ABNORMAL HIGH (ref 70–99)

## 2020-11-30 MED ORDER — ENOXAPARIN SODIUM 80 MG/0.8ML IJ SOSY
0.5000 mg/kg | PREFILLED_SYRINGE | INTRAMUSCULAR | Status: DC
  Administered 2020-11-30 – 2020-12-02 (×3): 67.5 mg via SUBCUTANEOUS
  Filled 2020-11-30 (×3): qty 0.8

## 2020-11-30 NOTE — Progress Notes (Signed)
Progress Note  Patient Name: Molly Howard Date of Encounter: 11/30/2020  Primary Cardiologist: Debbe Odea, MD  Subjective   No noticeable difference in degree of dyspnea or volume.  No chest pain.  Good urine output yesterday.  Inpatient Medications    Scheduled Meds:  atorvastatin  20 mg Oral Daily   carvedilol  6.25 mg Oral BID WC   dapagliflozin propanediol  10 mg Oral Daily   enoxaparin (LOVENOX) injection  0.5 mg/kg Subcutaneous Q24H   furosemide  80 mg Intravenous TID   insulin aspart  0-15 Units Subcutaneous TID WC   insulin aspart  0-5 Units Subcutaneous QHS   insulin aspart protamine- aspart  8 Units Subcutaneous BID WC   iron polysaccharides  150 mg Oral Daily   metolazone  5 mg Oral Daily   sacubitril-valsartan  1 tablet Oral BID   Continuous Infusions:  PRN Meds: acetaminophen, albuterol, chlorpheniramine-HYDROcodone, dextromethorphan-guaiFENesin, hydrALAZINE, ondansetron (ZOFRAN) IV   Vital Signs    Vitals:   11/30/20 0600 11/30/20 0600 11/30/20 0729 11/30/20 0848  BP:  107/75 118/88   Pulse:  99 (!) 101   Resp:  17 16   Temp:  97.9 F (36.6 C) 99 F (37.2 C)   TempSrc:      SpO2:  100% 100%   Weight: 132.5 kg   (!) 136.5 kg  Height:        Intake/Output Summary (Last 24 hours) at 11/30/2020 1001 Last data filed at 11/30/2020 0500 Gross per 24 hour  Intake 1100 ml  Output 4800 ml  Net -3700 ml   Filed Weights   11/29/20 0434 11/30/20 0600 11/30/20 0848  Weight: (!) 179.1 kg 132.5 kg (!) 136.5 kg    Physical Exam   GEN: Obese, in no acute distress.  HEENT: Grossly normal.  Neck: Supple, JVD to jaw, carotid bruits, or masses. Cardiac: RRR, distant, no murmurs, rubs, or gallops. No clubbing, cyanosis.  2+ bilateral woody lower extremity edema extending to the abdomen/mid back.  Radials 2+, DP/PT 1+ and equal bilaterally.  Respiratory:  Respirations regular and unlabored, clear to auscultation bilaterally. GI: Obese, firm with 2+  circumferential woody edema.  Nontender. BS + x 4. MS: no deformity or atrophy. Skin: warm and dry, no rash. Neuro:  Strength and sensation are intact. Psych: AAOx3.  Normal affect.  Labs    Chemistry Recent Labs  Lab 11/28/20 0505 11/29/20 0611 11/30/20 0739  NA 136 132* 137  K 4.4 4.3 4.2  CL 94* 93* 93*  CO2 32 28 32  GLUCOSE 98 144* 99  BUN 32* 32* 30*  CREATININE 1.19* 1.07* 1.05*  CALCIUM 7.9* 7.6* 8.0*  GFRNONAA >60 >60 >60  ANIONGAP 10 11 12      Hematology Recent Labs  Lab 11/27/20 0623  WBC 6.3  RBC 3.90  HGB 10.0*  HCT 33.5*  MCV 85.9  MCH 25.6*  MCHC 29.9*  RDW 18.2*  PLT 488*    Cardiac Enzymes  Recent Labs  Lab 11/27/20 0623  TROPONINIHS 13      BNP Recent Labs  Lab 11/27/20 0623  BNP 1,043.9*    Lipids  Lab Results  Component Value Date   CHOL 212 (H) 05/21/2020   HDL 35 (L) 05/21/2020   LDLCALC 153 (H) 05/21/2020   TRIG 121 05/21/2020   CHOLHDL 6.1 05/21/2020    HbA1c  Lab Results  Component Value Date   HGBA1C 11.3 (H) 10/11/2020    Radiology    DG Chest  2 View  Result Date: 11/27/2020 CLINICAL DATA:  35 year old female with chest pain, nonproductive cough, shortness of breath and lower extremity swelling. Unintentional weight gain. History of CHF. EXAM: CHEST - 2 VIEW COMPARISON:  Chest radiograph 10/10/2020 and earlier. FINDINGS: Seated AP and lateral views of the chest. Large body habitus. Lordotic AP view. Stable cardiomegaly and mediastinal contours. Other mediastinal contours are within normal limits. Visualized tracheal air column is within normal limits. Pulmonary vascularity appears stable since last month, no overt edema. No pneumothorax. No pleural effusion is evident. No consolidation. No acute osseous abnormality identified. Negative visible bowel gas pattern. IMPRESSION: Stable cardiomegaly and pulmonary vascular congestion since last month without overt edema. Electronically Signed   By: Odessa Fleming M.D.   On:  11/27/2020 07:22    Telemetry    Regular sinus rhythm.  5 beats of nonsustained VT- Personally Reviewed  Cardiac Studies    2D Echocardiogram 6.2022   1. Left ventricular ejection fraction, by estimation, is 30 to 35%. The  left ventricle has moderate to severely decreased function. The left  ventricle demonstrates global hypokinesis. Left ventricular diastolic  parameters are consistent with Grade II  diastolic dysfunction (pseudonormalization).   2. Right ventricular systolic function is mildly reduced. The right  ventricular size is normal.   3. A small pericardial effusion is present.   4. The mitral valve is normal in structure. No evidence of mitral valve  regurgitation.   5. Tricuspid valve regurgitation is moderate.   6. The aortic valve is tricuspid. Aortic valve regurgitation is not  visualized.  Patient Profile     35 y.o. female with a history of HFrEF, NICM, HTN, DMII, CKD II, GERD, microcytic anemia, obesity, medication nonadherarnce, and polysubstance abuse, who was admitted 11/27/2020 with worsening dyspnea and volume overload in the setting of noncompliance.  Assessment & Plan    Acute on chronic combined systolic and diastolic congestive heart failure/nonischemic cardiomyopathy: Readmitted July 28 with massive volume overload and progressive dyspnea/orthopnea.  EF 30 to 35% by echo during June admission.  Minus 3.3L overnight and minus 4.5 L since admission. Wt inaccurate.  She remains massively volume overloaded with woody edema from her feet to her abdomen.  Renal function electrolytes remained stable on Lasix 80 mg 3 times daily and metolazone 5 mg daily.  Continue carvedilol, Entresto, and Farxiga.  Pressures low 100s to 1 teens.  Follow-up today and consider spironolactone tomorrow.  2.  Essential hypertension: Stable.  3.  Type 2 diabetes mellitus: Poorly controlled at baseline with an A1c 11.3 in the setting of noncompliance.  Previously unable to afford  insulin.  Farxiga added yesterday.  Will need medication assistance.  4.  Stage II chronic kidney disease: Stable.  5.  Polysubstance abuse: She denies cocaine since June however, urine drug screen was positive for cocaine and opiates on admission.  Complete cessation advised.  We will need to reconsider beta-blocker utilization if she continues to use cocaine.  6.  Noncompliance: Patient says she cannot afford medications.  Does not work and does not have insurance.  Will need TOC assistance in obtaining co-pay cards and we can fill out drug assistance program paperwork.  Signed, Nicolasa Ducking, NP  11/30/2020, 10:01 AM    For questions or updates, please contact   Please consult [email or web address] for contact info under Cardiology/STEMI.

## 2020-11-30 NOTE — Progress Notes (Signed)
PROGRESS NOTE    Molly Howard  VQM:[id number] DOB: [birth year] DOA: 11/27/2020 PCP: Patient, No Pcp Per (Inactive)   Brief Narrative: Taken from H&P. Molly Howard is a 35 y.o. female with medical history significant of nonischemic cardiomyopathy and CHF with EF 30 to 35%, hypertension, hyperlipidemia, diabetes mellitus, GERD, anemia, former smoker, CKD-2, polysubstance abuse, who presents with shortness breath.   Patient states that her shortness breath has been going on for more than 1 week, which has been progressively worsening.  Patient also has dry cough, worsening bilateral leg edema, generalized weakness, approximately 40 pounds of weight gain in the past 2 weeks.  She has orthopnea.  Her shortness breath is worse when laying flat.  Denies chest pain, fever or chills.  Found to have BNP of 1043, COVID PCR negative, mild AKI and chest x-ray positive for cardiomegaly and vascular congestion.  Patient admitted for acute on chronic HFrEF with volume overload and started on IV diuresis.  Patient will need medication assistance on discharge.  Subjective: Patient was sitting in chair when seen today.  No new complaints.  Diuresing well, some improvement in shortness of breath but not at her baseline.  Assessment & Plan:   Principal Problem:   Acute on chronic combined systolic and diastolic CHF (congestive heart failure) (HCC) Active Problems:   Essential hypertension   HLD (hyperlipidemia)   Type II diabetes mellitus with renal manifestations (HCC)   Chronic kidney disease (CKD), stage II (mild)   Iron deficiency anemia  Acute on chronic combined systolic and diastolic CHF (congestive heart failure) (HCC): 2D echo on 10/11/2020 showed EF of 30-35% with grade 2 diastolic dysfunction.  Patient has orthopnea, 40 pounds of weight gain, elevated BNP 1043, vascular congestion on chest x-ray, clinically consistent with CHF exacerbation. Recent admissions due to similar reasons, saw cardiology  on 11/13/2020 and was advised to take her medications. Apparently she has not picked up her medications from pharmacy yet. Started diuresing with addition of metolazone. -Cardiology consult -Cardiology increase the dose of Lasix to 80 mg 3 times a day. -Continue metolazone -Strict intake and output -Daily weight and BMP  Essential hypertension.  Patient was not taking her blood pressure medications either. Blood pressure currently within goal. -Continue with Coreg along with Lasix. -Holding Cozaar due to AKI-can be resumed if needed as renal function improved.  HLD (hyperlipidemia): Patient is not taking Lipitor currently. -Continue Lipitor which was restarted during this admission  Type II diabetes mellitus with renal manifestations [facility]): Recent A1c 11.3, poorly controlled.  Patient is not taking 70/30 insulin 10 unit twice daily currently -70/30 insulin 8 mg twice daily -Sliding scale insulin   Chronic kidney disease (CKD), stage II (mild): Baseline creatinine is~1.0.  On admission it was 1.25, continue to improve, at 1.07 today -Keep holding Cozaar until blood pressure started trending up. -Continue with IV diuresis -Monitor renal function -Nephrotoxins  Iron deficiency anemia: Stable hemoglobin, 10.0 (9.8 on 10/18/2020).  Patient is not taking iron supplement currently -Restarted iron supplement on admission.  Stage III obesity. Estimated body mass index is 47.13 kg/m as calculated from the following:   Height as of this encounter: 5\' 7"  (1.702 m).   Weight as of this encounter: 136.5 kg.  This will complicate overall prognosis..  Objective: Vitals:   11/30/20 0600 11/30/20 0729 11/30/20 0848 11/30/20 1109  BP: 107/75 118/88  102/87  Pulse: 99 (!) 101  100  Resp: 17 16  17   Temp: 97.9 F (36.6 C)  99 F (37.2 C)  98.1 F (36.7 C)  TempSrc:      SpO2: 100% 100%  100%  Weight:   (!) 136.5 kg   Height:        Intake/Output Summary (Last 24 hours) at 11/30/2020  1421 Last data filed at 11/30/2020 1345 Gross per 24 hour  Intake 1360 ml  Output 5000 ml  Net -3640 ml    Filed Weights   11/29/20 0434 11/30/20 0600 11/30/20 0848  Weight: (!) 179.1 kg 132.5 kg (!) 136.5 kg    Examination:  General.  Well-developed, morbidly obese lady, in no acute distress. Pulmonary.  Lungs clear bilaterally, normal respiratory effort. CV.  Regular rate and rhythm, no JVD, rub or murmur. Abdomen.  Soft, nontender, nondistended, BS positive. CNS.  Alert and oriented x3.  No focal neurologic deficit. Extremities.  2+ LE edema up to belly, no cyanosis, pulses intact and symmetrical. Psychiatry.  Judgment and insight appears normal.   DVT prophylaxis: Lovenox Code Status: Full Family Communication: Discussed with patient Disposition Plan:  Status is: Inpatient  Remains inpatient appropriate because:Inpatient level of care appropriate due to severity of illness  Dispo: The patient is from: Home              Anticipated d/c is to: Home              Patient currently is not medically stable to d/c.   Difficult to place patient No              Level of care: Progressive Cardiac  All the records are reviewed and case discussed with Care Management/Social Worker. Management plans discussed with the patient, nursing and they are in agreement.  Consultants:  Cardiology  Procedures:  Antimicrobials:   Data Reviewed: I have personally reviewed following labs and imaging studies  CBC: Recent Labs  Lab 11/27/20 0623  WBC 6.3  HGB 10.0*  HCT 33.5*  MCV 85.9  PLT 488*    Basic Metabolic Panel: Recent Labs  Lab 11/27/20 0623 11/28/20 0505 11/29/20 0611 11/30/20 0739  NA 133* 136 132* 137  K 4.9 4.4 4.3 4.2  CL 93* 94* 93* 93*  CO2 30 32 28 32  GLUCOSE 339* 98 144* 99  BUN 30* 32* 32* 30*  CREATININE 1.25* 1.19* 1.07* 1.05*  CALCIUM 7.8* 7.9* 7.6* 8.0*  MG  --  1.6*  --   --     GFR: Estimated Creatinine Clearance: 107.1 mL/min (A) (by C-G  formula based on SCr of 1.05 mg/dL (H)). Liver Function Tests: No results for input(s): AST, ALT, ALKPHOS, BILITOT, PROT, ALBUMIN in the last 168 hours. No results for input(s): LIPASE, AMYLASE in the last 168 hours. No results for input(s): AMMONIA in the last 168 hours. Coagulation Profile: No results for input(s): INR, PROTIME in the last 168 hours. Cardiac Enzymes: No results for input(s): CKTOTAL, CKMB, CKMBINDEX, TROPONINI in the last 168 hours. BNP (last 3 results) No results for input(s): PROBNP in the last 8760 hours. HbA1C: No results for input(s): HGBA1C in the last 72 hours. CBG: Recent Labs  Lab 11/29/20 1210 11/29/20 1606 11/29/20 2210 11/30/20 0731 11/30/20 1110  GLUCAP 168* 195* 188* 110* 174*    Lipid Profile: No results for input(s): CHOL, HDL, LDLCALC, TRIG, CHOLHDL, LDLDIRECT in the last 72 hours. Thyroid Function Tests: No results for input(s): TSH, T4TOTAL, FREET4, T3FREE, THYROIDAB in the last 72 hours. Anemia Panel: No results for input(s): VITAMINB12, FOLATE, FERRITIN, TIBC, IRON, RETICCTPCT  in the last 72 hours. Sepsis Labs: No results for input(s): PROCALCITON, LATICACIDVEN in the last 168 hours.  Recent Results (from the past 240 hour(s))  Resp Panel by RT-PCR (Flu A&B, Covid) Nasopharyngeal Swab     Status: None   Collection Time: 11/27/20  8:02 AM   Specimen: Nasopharyngeal Swab; Nasopharyngeal(NP) swabs in vial transport medium  Result Value Ref Range Status   SARS Coronavirus 2 by RT PCR NEGATIVE NEGATIVE Final    Comment: (NOTE) SARS-CoV-2 target nucleic acids are NOT DETECTED.  The SARS-CoV-2 RNA is generally detectable in upper respiratory specimens during the acute phase of infection. The lowest concentration of SARS-CoV-2 viral copies this assay can detect is 138 copies/mL. A negative result does not preclude SARS-Cov-2 infection and should not be used as the sole basis for treatment or other patient management decisions. A negative  result may occur with  improper specimen collection/handling, submission of specimen other than nasopharyngeal swab, presence of viral mutation(s) within the areas targeted by this assay, and inadequate number of viral copies(<138 copies/mL). A negative result must be combined with clinical observations, patient history, and epidemiological information. The expected result is Negative.  Fact Sheet for Patients:  BloggerCourse.com  Fact Sheet for Healthcare Providers:  SeriousBroker.it  This test is no t yet approved or cleared by the [place] FDA and  has been authorized for detection and/or diagnosis of SARS-CoV-2 by FDA under an Emergency Use Authorization (EUA). This EUA will remain  in effect (meaning this test can be used) for the duration of the COVID-19 declaration under Section 564(b)(1) of the Act, 21 U.S.C.section 360bbb-3(b)(1), unless the authorization is terminated  or revoked sooner.       Influenza A by PCR NEGATIVE NEGATIVE Final   Influenza B by PCR NEGATIVE NEGATIVE Final    Comment: (NOTE) The Xpert Xpress SARS-CoV-2/FLU/RSV plus assay is intended as an aid in the diagnosis of influenza from Nasopharyngeal swab specimens and should not be used as a sole basis for treatment. Nasal washings and aspirates are unacceptable for Xpert Xpress SARS-CoV-2/FLU/RSV testing.  Fact Sheet for Patients: BloggerCourse.com  Fact Sheet for Healthcare Providers: SeriousBroker.it  This test is not yet approved or cleared by the [place] FDA and has been authorized for detection and/or diagnosis of SARS-CoV-2 by FDA under an Emergency Use Authorization (EUA). This EUA will remain in effect (meaning this test can be used) for the duration of the COVID-19 declaration under Section 564(b)(1) of the Act, 21 U.S.C. section 360bbb-3(b)(1), unless the authorization is  terminated or revoked.  Performed at [facility], [address] [place], [place] [address]       Radiology Studies: No results found.  Scheduled Meds:  atorvastatin  20 mg Oral Daily   carvedilol  6.25 mg Oral BID WC   dapagliflozin propanediol  10 mg Oral Daily   enoxaparin (LOVENOX) injection  0.5 mg/kg Subcutaneous Q24H   furosemide  80 mg Intravenous TID   insulin aspart  0-15 Units Subcutaneous TID WC   insulin aspart  0-5 Units Subcutaneous QHS   insulin aspart protamine- aspart  8 Units Subcutaneous BID WC   iron polysaccharides  150 mg Oral Daily   metolazone  5 mg Oral Daily   sacubitril-valsartan  1 tablet Oral BID   Continuous Infusions:   LOS: 3 days   Time spent: 32 minutes. More than 50% of the time was spent in counseling/coordination of care  Arnetha Courser, MD [facility]  If 7PM-7AM,  please contact night-coverage [email or web address]  11/30/2020, 2:21 PM   This record has been created using Conservation officer, historic buildings. Errors have been sought and corrected,but may not always be located. Such creation errors do not reflect on the standard of care.

## 2020-12-01 LAB — CBC
HCT: 30.8 % — ABNORMAL LOW (ref 36.0–46.0)
Hemoglobin: 9.2 g/dL — ABNORMAL LOW (ref 12.0–15.0)
MCH: 24.9 pg — ABNORMAL LOW (ref 26.0–34.0)
MCHC: 29.9 g/dL — ABNORMAL LOW (ref 30.0–36.0)
MCV: 83.5 fL (ref 80.0–100.0)
Platelets: 479 10*3/uL — ABNORMAL HIGH (ref 150–400)
RBC: 3.69 MIL/uL — ABNORMAL LOW (ref 3.87–5.11)
RDW: 17.9 % — ABNORMAL HIGH (ref 11.5–15.5)
WBC: 5.8 10*3/uL (ref 4.0–10.5)
nRBC: 0.3 % — ABNORMAL HIGH (ref 0.0–0.2)

## 2020-12-01 LAB — BASIC METABOLIC PANEL
Anion gap: 12 (ref 5–15)
BUN: 30 mg/dL — ABNORMAL HIGH (ref 6–20)
CO2: 31 mmol/L (ref 22–32)
Calcium: 7.7 mg/dL — ABNORMAL LOW (ref 8.9–10.3)
Chloride: 93 mmol/L — ABNORMAL LOW (ref 98–111)
Creatinine, Ser: 0.96 mg/dL (ref 0.44–1.00)
GFR, Estimated: >60 mL/min (ref >60)
Glucose, Bld: 146 mg/dL — ABNORMAL HIGH (ref 70–99)
Potassium: 3.6 mmol/L (ref 3.5–5.1)
Sodium: 136 mmol/L (ref 135–145)

## 2020-12-01 LAB — GLUCOSE, CAPILLARY
Glucose-Capillary: 158 mg/dL — ABNORMAL HIGH (ref 70–99)
Glucose-Capillary: 185 mg/dL — ABNORMAL HIGH (ref 70–99)
Glucose-Capillary: 205 mg/dL — ABNORMAL HIGH (ref 70–99)
Glucose-Capillary: 213 mg/dL — ABNORMAL HIGH (ref 70–99)

## 2020-12-01 MED ORDER — POTASSIUM CHLORIDE CRYS ER 20 MEQ PO TBCR
40.0000 meq | EXTENDED_RELEASE_TABLET | Freq: Once | ORAL | Status: AC
  Administered 2020-12-01: 40 meq via ORAL
  Filled 2020-12-01: qty 2

## 2020-12-01 MED ORDER — SPIRONOLACTONE 25 MG PO TABS
25.0000 mg | ORAL_TABLET | Freq: Every day | ORAL | Status: DC
  Administered 2020-12-01 – 2020-12-08 (×8): 25 mg via ORAL
  Filled 2020-12-01 (×8): qty 1

## 2020-12-01 NOTE — Progress Notes (Signed)
Inpatient Diabetes Program Recommendations  AACE/ADA: New Consensus Statement on Inpatient Glycemic Control (2015)  Target Ranges:  Prepandial:   less than 140 mg/dL      Peak postprandial:   less than 180 mg/dL (1-2 hours)      Critically ill patients:  140 - 180 mg/dL   Results for Molly Howard, Molly Howard (MRN [id number]) as of 12/01/2020 10:07  Ref. Range 11/30/2020 07:31 11/30/2020 11:10 11/30/2020 16:27 11/30/2020 20:22  Glucose-Capillary Latest Ref Range: 70 - 99 mg/dL 770 (H) 340 (H) 352 (H) 273 (H)    Home DM Meds: 70/30 Insulin 10 units BID (NOT taking)  Current Orders: 70/30 Insulin 8 units BID                Novolog 0-15 units TID ac/hs   Farxiga 10 mg Daily      MD- Note later afternoon CBGs elevated  May consider increasing the PM dose of the 70/30 Insulin to 10 units     --Will follow patient during hospitalization--  Ambrose Finland RN, MSN, CDE Diabetes Coordinator Inpatient Glycemic Control Team Team Pager: [phone] (8a-5p)

## 2020-12-01 NOTE — Progress Notes (Signed)
PROGRESS NOTE    Molly Howard  OMV:[id number] DOB: 12/23/1985 DOA: 11/27/2020 PCP: Patient, No Pcp Per (Inactive)   Brief Narrative: Taken from H&P. Molly Howard is a 35 y.o. female with medical history significant of nonischemic cardiomyopathy and CHF with EF 30 to 35%, hypertension, hyperlipidemia, diabetes mellitus, GERD, anemia, former smoker, CKD-2, polysubstance abuse, who presents with shortness breath.   Patient states that her shortness breath has been going on for more than 1 week, which has been progressively worsening.  Patient also has dry cough, worsening bilateral leg edema, generalized weakness, approximately 40 pounds of weight gain in the past 2 weeks.  She has orthopnea.  Her shortness breath is worse when laying flat.  Denies chest pain, fever or chills.  Found to have BNP of 1043, COVID PCR negative, mild AKI and chest x-ray positive for cardiomegaly and vascular congestion.  Patient admitted for acute on chronic HFrEF with volume overload and started on IV diuresis.  Patient will need medication assistance on discharge.  Subjective: Patient was seen and examined today.  Breathing slowly improving, still having exertional dyspnea.  Assessment & Plan:   Principal Problem:   Acute on chronic combined systolic and diastolic CHF (congestive heart failure) (HCC) Active Problems:   Essential hypertension   HLD (hyperlipidemia)   Type II diabetes mellitus with renal manifestations (HCC)   Chronic kidney disease (CKD), stage II (mild)   Iron deficiency anemia  Acute on chronic combined systolic and diastolic CHF (congestive heart failure) (HCC): 2D echo on 10/11/2020 showed EF of 30-35% with grade 2 diastolic dysfunction.  Patient has orthopnea, 40 pounds of weight gain, elevated BNP 1043, vascular congestion on chest x-ray, clinically consistent with CHF exacerbation. Recent admissions due to similar reasons, saw cardiology on 11/13/2020 and was advised to take her  medications. Apparently she has not picked up her medications from pharmacy yet. Started diuresing with addition of metolazone. -Cardiology consult -Cardiology increase the dose of Lasix to 80 mg 3 times a day. -Continue metolazone -Strict intake and output -Daily weight and BMP  Essential hypertension.  Patient was not taking her blood pressure medications either. Blood pressure currently within goal. -Continue with Coreg along with Lasix. -Holding Cozaar due to AKI-can be resumed if needed as renal function improved.  HLD (hyperlipidemia): Patient is not taking Lipitor currently. -Continue Lipitor which was restarted during this admission  Type II diabetes mellitus with renal manifestations [facility]): Recent A1c 11.3, poorly controlled.  Patient is not taking 70/30 insulin 10 unit twice daily currently -70/30 insulin 8 mg twice daily -Sliding scale insulin   Chronic kidney disease (CKD), stage II (mild): Baseline creatinine is~1.0.  On admission it was 1.25, continue to improve, at 0.96 today -Keep holding Cozaar until blood pressure started trending up. -Continue with IV diuresis -Monitor renal function -Nephrotoxins  Iron deficiency anemia: Stable hemoglobin, 10.0 (9.8 on 10/18/2020).  Patient is not taking iron supplement currently -Restarted iron supplement on admission.  Stage III obesity. Estimated body mass index is 42.88 kg/m as calculated from the following:   Height as of this encounter: 5\' 7"  (1.702 m).   Weight as of this encounter: 124.2 kg.  This will complicate overall prognosis..  Objective: Vitals:   12/01/20 0912 12/01/20 1156 12/01/20 1157 12/01/20 1415  BP: 115/80 118/82 118/82   Pulse: 100 (!) 101 (!) 101   Resp: 16 20 20    Temp: 98.5 F (36.9 C) 98.5 F (36.9 C) 98.5 F (36.9 C)   TempSrc:  Oral Oral   SpO2: 97% 99% 98%   Weight:    124.2 kg  Height:        Intake/Output Summary (Last 24 hours) at 12/01/2020 1553 Last data filed at 12/01/2020  1418 Gross per 24 hour  Intake 1320 ml  Output 5300 ml  Net -3980 ml    Filed Weights   11/30/20 0848 12/01/20 0500 12/01/20 1415  Weight: (!) 136.5 kg (!) 136.6 kg 124.2 kg    Examination:  General.  Morbidly obese lady, in no acute distress. Pulmonary.  Lungs clear bilaterally, normal respiratory effort. CV.  Regular rate and rhythm, no JVD, rub or murmur. Abdomen.  Soft, nontender, nondistended, BS positive. CNS.  Alert and oriented x3.  No focal neurologic deficit. Extremities.  2+ LE edema, no cyanosis, pulses intact and symmetrical. Psychiatry.  Judgment and insight appears normal.   DVT prophylaxis: Lovenox Code Status: Full Family Communication: Discussed with patient Disposition Plan:  Status is: Inpatient  Remains inpatient appropriate because:Inpatient level of care appropriate due to severity of illness  Dispo: The patient is from: Home              Anticipated d/c is to: Home              Patient currently is not medically stable to d/c.   Difficult to place patient No              Level of care: Progressive Cardiac  All the records are reviewed and case discussed with Care Management/Social Worker. Management plans discussed with the patient, nursing and they are in agreement.  Consultants:  Cardiology  Procedures:  Antimicrobials:   Data Reviewed: I have personally reviewed following labs and imaging studies  CBC: Recent Labs  Lab 11/27/20 0623 12/01/20 0658  WBC 6.3 5.8  HGB 10.0* 9.2*  HCT 33.5* 30.8*  MCV 85.9 83.5  PLT 488* 479*    Basic Metabolic Panel: Recent Labs  Lab 11/27/20 0623 11/28/20 0505 11/29/20 0611 11/30/20 0739 12/01/20 0658  NA 133* 136 132* 137 136  K 4.9 4.4 4.3 4.2 3.6  CL 93* 94* 93* 93* 93*  CO2 30 32 28 32 31  GLUCOSE 339* 98 144* 99 146*  BUN 30* 32* 32* 30* 30*  CREATININE 1.25* 1.19* 1.07* 1.05* 0.96  CALCIUM 7.8* 7.9* 7.6* 8.0* 7.7*  MG  --  1.6*  --   --   --     GFR: Estimated Creatinine  Clearance: 110.8 mL/min (by C-G formula based on SCr of 0.96 mg/dL). Liver Function Tests: No results for input(s): AST, ALT, ALKPHOS, BILITOT, PROT, ALBUMIN in the last 168 hours. No results for input(s): LIPASE, AMYLASE in the last 168 hours. No results for input(s): AMMONIA in the last 168 hours. Coagulation Profile: No results for input(s): INR, PROTIME in the last 168 hours. Cardiac Enzymes: No results for input(s): CKTOTAL, CKMB, CKMBINDEX, TROPONINI in the last 168 hours. BNP (last 3 results) No results for input(s): PROBNP in the last 8760 hours. HbA1C: No results for input(s): HGBA1C in the last 72 hours. CBG: Recent Labs  Lab 11/30/20 1110 11/30/20 1627 11/30/20 2022 12/01/20 0913 12/01/20 1157  GLUCAP 174* 205* 273* 158* 185*    Lipid Profile: No results for input(s): CHOL, HDL, LDLCALC, TRIG, CHOLHDL, LDLDIRECT in the last 72 hours. Thyroid Function Tests: No results for input(s): TSH, T4TOTAL, FREET4, T3FREE, THYROIDAB in the last 72 hours. Anemia Panel: No results for input(s): VITAMINB12, FOLATE, FERRITIN, TIBC, IRON,  RETICCTPCT in the last 72 hours. Sepsis Labs: No results for input(s): PROCALCITON, LATICACIDVEN in the last 168 hours.  Recent Results (from the past 240 hour(s))  Resp Panel by RT-PCR (Flu A&B, Covid) Nasopharyngeal Swab     Status: None   Collection Time: 11/27/20  8:02 AM   Specimen: Nasopharyngeal Swab; Nasopharyngeal(NP) swabs in vial transport medium  Result Value Ref Range Status   SARS Coronavirus 2 by RT PCR NEGATIVE NEGATIVE Final    Comment: (NOTE) SARS-CoV-2 target nucleic acids are NOT DETECTED.  The SARS-CoV-2 RNA is generally detectable in upper respiratory specimens during the acute phase of infection. The lowest concentration of SARS-CoV-2 viral copies this assay can detect is 138 copies/mL. A negative result does not preclude SARS-Cov-2 infection and should not be used as the sole basis for treatment or other patient  management decisions. A negative result may occur with  improper specimen collection/handling, submission of specimen other than nasopharyngeal swab, presence of viral mutation(s) within the areas targeted by this assay, and inadequate number of viral copies(<138 copies/mL). A negative result must be combined with clinical observations, patient history, and epidemiological information. The expected result is Negative.  Fact Sheet for Patients:  BloggerCourse.com  Fact Sheet for Healthcare Providers:  SeriousBroker.it  This test is no t yet approved or cleared by the [place] FDA and  has been authorized for detection and/or diagnosis of SARS-CoV-2 by FDA under an Emergency Use Authorization (EUA). This EUA will remain  in effect (meaning this test can be used) for the duration of the COVID-19 declaration under Section 564(b)(1) of the Act, 21 U.S.C.section 360bbb-3(b)(1), unless the authorization is terminated  or revoked sooner.       Influenza A by PCR NEGATIVE NEGATIVE Final   Influenza B by PCR NEGATIVE NEGATIVE Final    Comment: (NOTE) The Xpert Xpress SARS-CoV-2/FLU/RSV plus assay is intended as an aid in the diagnosis of influenza from Nasopharyngeal swab specimens and should not be used as a sole basis for treatment. Nasal washings and aspirates are unacceptable for Xpert Xpress SARS-CoV-2/FLU/RSV testing.  Fact Sheet for Patients: BloggerCourse.com  Fact Sheet for Healthcare Providers: SeriousBroker.it  This test is not yet approved or cleared by the [place] FDA and has been authorized for detection and/or diagnosis of SARS-CoV-2 by FDA under an Emergency Use Authorization (EUA). This EUA will remain in effect (meaning this test can be used) for the duration of the COVID-19 declaration under Section 564(b)(1) of the Act, 21 U.S.C. section 360bbb-3(b)(1),  unless the authorization is terminated or revoked.  Performed at [facility], [address] [place], [place] [address]       Radiology Studies: No results found.  Scheduled Meds:  atorvastatin  20 mg Oral Daily   carvedilol  6.25 mg Oral BID WC   dapagliflozin propanediol  10 mg Oral Daily   enoxaparin (LOVENOX) injection  0.5 mg/kg Subcutaneous Q24H   furosemide  80 mg Intravenous TID   insulin aspart  0-15 Units Subcutaneous TID WC   insulin aspart  0-5 Units Subcutaneous QHS   insulin aspart protamine- aspart  8 Units Subcutaneous BID WC   iron polysaccharides  150 mg Oral Daily   metolazone  5 mg Oral Daily   sacubitril-valsartan  1 tablet Oral BID   spironolactone  25 mg Oral Daily   Continuous Infusions:   LOS: 4 days   Time spent: 32 minutes. More than 50% of the time was spent in counseling/coordination of care  Arnetha Courser, MD [facility]  If 7PM-7AM, please contact night-coverage [email or web address]  12/01/2020, 3:53 PM   This record has been created using Conservation officer, historic buildings. Errors have been sought and corrected,but may not always be located. Such creation errors do not reflect on the standard of care.

## 2020-12-01 NOTE — Progress Notes (Addendum)
Progress Note  Patient Name: Molly Howard Date of Encounter: 12/01/2020  Primary Cardiologist: Debbe Odea, MD   Subjective   No CP. Reports SOB with moving around the room. Breathing and volume status not yet at baseline. Tachypalpitations last night for about 5 minutes. LEE improving, still not at baseline.  Inpatient Medications    Scheduled Meds:  atorvastatin  20 mg Oral Daily   carvedilol  6.25 mg Oral BID WC   dapagliflozin propanediol  10 mg Oral Daily   enoxaparin (LOVENOX) injection  0.5 mg/kg Subcutaneous Q24H   furosemide  80 mg Intravenous TID   insulin aspart  0-15 Units Subcutaneous TID WC   insulin aspart  0-5 Units Subcutaneous QHS   insulin aspart protamine- aspart  8 Units Subcutaneous BID WC   iron polysaccharides  150 mg Oral Daily   metolazone  5 mg Oral Daily   sacubitril-valsartan  1 tablet Oral BID   Continuous Infusions:  PRN Meds: acetaminophen, albuterol, chlorpheniramine-HYDROcodone, dextromethorphan-guaiFENesin, hydrALAZINE, ondansetron (ZOFRAN) IV   Vital Signs    Vitals:   11/30/20 1508 11/30/20 2003 12/01/20 0412 12/01/20 0500  BP: 110/85 122/85 131/78   Pulse: 96 100 98   Resp: 17 18 20    Temp: 98.3 F (36.8 C) 97.6 F (36.4 C) 98.3 F (36.8 C)   TempSrc:  Oral    SpO2: 98% 98% 100%   Weight:    (!) 136.6 kg  Height:        Intake/Output Summary (Last 24 hours) at 12/01/2020 0840 Last data filed at 12/01/2020 0500 Gross per 24 hour  Intake 1440 ml  Output 6400 ml  Net -4960 ml   Last 3 Weights 12/01/2020 11/30/2020 11/30/2020  Weight (lbs) 301 lb 2.4 oz 300 lb 14.4 oz 292 lb 1.6 oz  Weight (kg) 136.6 kg 136.487 kg 132.496 kg  Some encounter information is confidential and restricted. Go to Review Flowsheets activity to see all data.      Telemetry    NSR, ST, NSVT - Personally Reviewed  ECG    No new tracings - Personally Reviewed  Physical Exam   GEN: Obese female. No acute distress.  Sitting in chair next  to bed. Neck: Unable to assess JVD due to body habitus, position of pt Cardiac: tachycardic but regular, no murmurs, rubs, or gallops.  Respiratory: Bibasilar crackles. GI: Firm, distended, not tender  MS: 1+ Bilateral woody edema; No deformity. Neuro:  Nonfocal  Psych: Normal affect   Labs    High Sensitivity Troponin:   Recent Labs  Lab 11/27/20 0623  TROPONINIHS 13      Chemistry Recent Labs  Lab 11/29/20 0611 11/30/20 0739 12/01/20 0658  NA 132* 137 136  K 4.3 4.2 3.6  CL 93* 93* 93*  CO2 28 32 31  GLUCOSE 144* 99 146*  BUN 32* 30* 30*  CREATININE 1.07* 1.05* 0.96  CALCIUM 7.6* 8.0* 7.7*  GFRNONAA >60 >60 >60  ANIONGAP 11 12 12      Hematology Recent Labs  Lab 11/27/20 0623 12/01/20 0658  WBC 6.3 5.8  RBC 3.90 3.69*  HGB 10.0* 9.2*  HCT 33.5* 30.8*  MCV 85.9 83.5  MCH 25.6* 24.9*  MCHC 29.9* 29.9*  RDW 18.2* 17.9*  PLT 488* 479*    BNP Recent Labs  Lab 11/27/20 0623  BNP 1,043.9*     DDimer No results for input(s): DDIMER in the last 168 hours.   Radiology    No results found.  Cardiac Studies  2D Echocardiogram 6.2022   1. Left ventricular ejection fraction, by estimation, is 30 to 35%. The  left ventricle has moderate to severely decreased function. The left  ventricle demonstrates global hypokinesis. Left ventricular diastolic  parameters are consistent with Grade II  diastolic dysfunction (pseudonormalization).   2. Right ventricular systolic function is mildly reduced. The right  ventricular size is normal.   3. A small pericardial effusion is present.   4. The mitral valve is normal in structure. No evidence of mitral valve  regurgitation.   5. Tricuspid valve regurgitation is moderate.   6. The aortic valve is tricuspid. Aortic valve regurgitation is not  visualized.  Patient Profile     35 y.o. female with history of HFrEF, NICM, HTN, DMII, CKD II, GERD, microcytic anemia, obesity, medication nonadherarnce, and  polysubstance abuse, who was admitted 11/27/2020 with worsening dyspnea and volume overload in the setting of noncompliance.  Assessment & Plan    Acute on chronic combined systolic and diastolic congestive heart failure NICM --Continue IV Lasix 80mg  TID and metolazone as Cr/BP allows. Net -9.5L and -5L yesterday. Wt 136.5  136.6kg --Daily BMET.   Current renal function stable Cr 1.05  0.96. Replete K. --CHF education --Continue carvedilol, , Farxiga. --Given room in BP and stable Cr with continued hypokalmeia, will add spironolactone today.  Hypokalemia --Ordered KCL tab 05-07-2002 x1. Replete with goal 4.0. Will add spironolactone as well as above. Check Mg.   Essential hypertension --Continue current medications. Will add spironolactone as above.  DM2 --A1c 11.3.  Unable to afford insulin.  Farxiga added 7/30.  Will need social work/medication assistance program.  Per IM.  CKD stage II --Daily BMET.  Continue to monitor  Polysubstance use --UDS positive for cocaine, opiates. --Currently on carvedilol, combination alpha / BB.  --Complete cessation advised.  Medication noncompliance --Recommend SW consultation / medicaiton assistance paperwork.     For questions or updates, please contact [facility] Please consult [email or web address] for contact info under        Signed, 8/30, PA-C  12/01/2020, 8:40 AM

## 2020-12-02 LAB — BASIC METABOLIC PANEL
Anion gap: 12 (ref 5–15)
BUN: 27 mg/dL — ABNORMAL HIGH (ref 6–20)
CO2: 31 mmol/L (ref 22–32)
Calcium: 7.8 mg/dL — ABNORMAL LOW (ref 8.9–10.3)
Chloride: 94 mmol/L — ABNORMAL LOW (ref 98–111)
Creatinine, Ser: 1.11 mg/dL — ABNORMAL HIGH (ref 0.44–1.00)
GFR, Estimated: >60 mL/min (ref >60)
Glucose, Bld: 131 mg/dL — ABNORMAL HIGH (ref 70–99)
Potassium: 3.9 mmol/L (ref 3.5–5.1)
Sodium: 137 mmol/L (ref 135–145)

## 2020-12-02 LAB — GLUCOSE, CAPILLARY
Glucose-Capillary: 126 mg/dL — ABNORMAL HIGH (ref 70–99)
Glucose-Capillary: 226 mg/dL — ABNORMAL HIGH (ref 70–99)
Glucose-Capillary: 238 mg/dL — ABNORMAL HIGH (ref 70–99)
Glucose-Capillary: 255 mg/dL — ABNORMAL HIGH (ref 70–99)

## 2020-12-02 MED ORDER — POLYETHYLENE GLYCOL 3350 17 G PO PACK
17.0000 g | PACK | Freq: Every day | ORAL | Status: DC
  Administered 2020-12-05: 17 g via ORAL
  Filled 2020-12-02 (×2): qty 1

## 2020-12-02 NOTE — Progress Notes (Signed)
Progress Note  Patient Name: Molly Howard Date of Encounter: 12/02/2020  Primary Cardiologist: Debbe Odea, MD   Subjective   No CP. Reports R upper arm pain that started this admission - unclear etiology.  Reports improving volume status.  Still urinating. Reports abdominal distention, improving lower extremity edema.  She has not been up to ambulate around the room today.   She did ambulate yesterday with some shortness of breath.  Reports ongoing tachypalpitations at rest, especially at night.  Inpatient Medications    Scheduled Meds:  atorvastatin  20 mg Oral Daily   carvedilol  6.25 mg Oral BID WC   dapagliflozin propanediol  10 mg Oral Daily   enoxaparin (LOVENOX) injection  0.5 mg/kg Subcutaneous Q24H   furosemide  80 mg Intravenous TID   insulin aspart  0-15 Units Subcutaneous TID WC   insulin aspart  0-5 Units Subcutaneous QHS   insulin aspart protamine- aspart  8 Units Subcutaneous BID WC   iron polysaccharides  150 mg Oral Daily   metolazone  5 mg Oral Daily   sacubitril-valsartan  1 tablet Oral BID   spironolactone  25 mg Oral Daily   Continuous Infusions:  PRN Meds: acetaminophen, albuterol, chlorpheniramine-HYDROcodone, dextromethorphan-guaiFENesin, hydrALAZINE, ondansetron (ZOFRAN) IV   Vital Signs    Vitals:   12/01/20 2034 12/02/20 0504 12/02/20 0840 12/02/20 1000  BP: 107/83 112/77 122/84   Pulse: 98 94 99   Resp: 16 16 18    Temp: 98.3 F (36.8 C) 98.5 F (36.9 C) 98.8 F (37.1 C)   TempSrc: Oral     SpO2: 100% 95% 96%   Weight:    121.1 kg  Height:        Intake/Output Summary (Last 24 hours) at 12/02/2020 1025 Last data filed at 12/02/2020 1000 Gross per 24 hour  Intake 480 ml  Output 3900 ml  Net -3420 ml    Last 3 Weights 12/02/2020 12/01/2020 12/01/2020  Weight (lbs) 267 lb 273 lb 13 oz 301 lb 2.4 oz  Weight (kg) 121.11 kg 124.2 kg 136.6 kg  Some encounter information is confidential and restricted. Go to Review Flowsheets  activity to see all data.      Telemetry    NSR, ST, NSVT - Personally Reviewed  ECG    No new tracings - Personally Reviewed  Physical Exam   GEN: Obese female. No acute distress.  Sitting in chair next to bed. Neck: Unable to assess JVD due to body habitus, position of pt Cardiac: tachycardic but regular, no murmurs, rubs, or gallops.  Respiratory: Bibasilar crackles. GI: Firm, distended, not tender  MS: Moderate bilateral woody edema; No deformity. Neuro:  Nonfocal  Psych: Normal affect   Labs    High Sensitivity Troponin:   Recent Labs  Lab 11/27/20 0623  TROPONINIHS 13       Chemistry Recent Labs  Lab 11/30/20 0739 12/01/20 0658 12/02/20 0741  NA 137 136 137  K 4.2 3.6 3.9  CL 93* 93* 94*  CO2 32 31 31  GLUCOSE 99 146* 131*  BUN 30* 30* 27*  CREATININE 1.05* 0.96 1.11*  CALCIUM 8.0* 7.7* 7.8*  GFRNONAA >60 >60 >60  ANIONGAP 12 12 12       Hematology Recent Labs  Lab 11/27/20 0623 12/01/20 0658  WBC 6.3 5.8  RBC 3.90 3.69*  HGB 10.0* 9.2*  HCT 33.5* 30.8*  MCV 85.9 83.5  MCH 25.6* 24.9*  MCHC 29.9* 29.9*  RDW 18.2* 17.9*  PLT 488* 479*  BNP Recent Labs  Lab 11/27/20 0623  BNP 1,043.9*      DDimer No results for input(s): DDIMER in the last 168 hours.   Radiology    No results found.  Cardiac Studies   2D Echocardiogram 6.2022 1. Left ventricular ejection fraction, by estimation, is 30 to 35%. The  left ventricle has moderate to severely decreased function. The left  ventricle demonstrates global hypokinesis. Left ventricular diastolic  parameters are consistent with Grade II  diastolic dysfunction (pseudonormalization).   2. Right ventricular systolic function is mildly reduced. The right  ventricular size is normal.   3. A small pericardial effusion is present.   4. The mitral valve is normal in structure. No evidence of mitral valve  regurgitation.   5. Tricuspid valve regurgitation is moderate.   6. The aortic  valve is tricuspid. Aortic valve regurgitation is not  visualized.  Patient Profile     35 y.o. female with history of HFrEF, NICM, HTN, DMII, CKD II, GERD, microcytic anemia, obesity, medication nonadherarnce, and polysubstance abuse, who was admitted 11/27/2020 with worsening dyspnea and volume overload in the setting of noncompliance.  Assessment & Plan    Acute on chronic combined systolic and diastolic congestive heart failure NICM --Still volume up on exam.  BNP 7/28 1043.9. --Continue IV Lasix 80mg  TID and metolazone as Cr/BP allows. Net -12.7L and -2.7 yesterday. Wt 124.2  121.1kg. Wt thought not accurate. --Daily BMET.  Cr bumped slightly overnight but within expected range, given addition of spironolactone.  Cr 0.96  1.11, BUN 30  27.  K 3.6  3.9. --Continue carvedilol, Entresto, spironolactone, and .  Essential hypertension --Improved.  BP 122/84. Continue Coreg, lasix, Entresto, spironolactone  DM2 --A1c 11.3.  Unable to afford insulin.  Farxiga added 7/30.  Will need social work/medication assistance program.  Per IM.  CKD stage II --Daily BMET.  Continue to monitor.  As above, creatinine bumped overnight but within an acceptable range considering addition of spironolactone  Polysubstance use --UDS positive for cocaine, opiates. --Currently on carvedilol, combination alpha / BB.  --Complete cessation advised.  Medication noncompliance --Recommend SW consultation / medicaiton assistance paperwork.     For questions or updates, please contact [facility] Please consult [email or web address] for contact info under        Signed, 8/30, PA-C  12/02/2020, 10:25 AM

## 2020-12-02 NOTE — Progress Notes (Signed)
PROGRESS NOTE    Molly Howard  GYJ:[id number] DOB: January 09, 1986 DOA: 11/27/2020 PCP: Patient, No Pcp Per (Inactive)   Brief Narrative: Taken from H&P. Molly Howard is a 35 y.o. female with medical history significant of nonischemic cardiomyopathy and CHF with EF 30 to 35%, hypertension, hyperlipidemia, diabetes mellitus, GERD, anemia, former smoker, CKD-2, polysubstance abuse, who presents with shortness breath.   Patient states that her shortness breath has been going on for more than 1 week, which has been progressively worsening.  Patient also has dry cough, worsening bilateral leg edema, generalized weakness, approximately 40 pounds of weight gain in the past 2 weeks.  She has orthopnea.  Her shortness breath is worse when laying flat.  Denies chest pain, fever or chills.  Found to have BNP of 1043, COVID PCR negative, mild AKI and chest x-ray positive for cardiomegaly and vascular congestion.  Patient admitted for acute on chronic HFrEF with volume overload and started on IV diuresis.  Cardiology is on board.  Patient will need medication assistance on discharge.  Subjective: Patient continues to have some exertional dyspnea, still having some orthopnea.  Peripheral edema seems improving.  Assessment & Plan:   Principal Problem:   Acute on chronic combined systolic and diastolic CHF (congestive heart failure) (HCC) Active Problems:   Essential hypertension   HLD (hyperlipidemia)   Type II diabetes mellitus with renal manifestations (HCC)   Chronic kidney disease (CKD), stage II (mild)   Iron deficiency anemia  Acute on chronic combined systolic and diastolic CHF (congestive heart failure) (HCC): 2D echo on 10/11/2020 showed EF of 30-35% with grade 2 diastolic dysfunction.  Patient has orthopnea, 40 pounds of weight gain, elevated BNP 1043, vascular congestion on chest x-ray, clinically consistent with CHF exacerbation. Recent admissions due to similar reasons, saw cardiology on  11/13/2020 and was advised to take her medications. Apparently she has not picked up her medications from pharmacy yet. Started diuresing with addition of metolazone. Mild worsening of creatinine-cardiology would like to continue current management. -Cardiology consult -Cardiology increase the dose of Lasix to 80 mg 3 times a day. -Continue metolazone -Spironolactone was also added. -Strict intake and output -Daily weight and BMP  Essential hypertension.  Patient was not taking her blood pressure medications either. Blood pressure currently within goal. -Continue with Coreg along with Lasix. -Holding Cozaar due to AKI-can be resumed if needed as renal function improved.  HLD (hyperlipidemia): Patient is not taking Lipitor currently. -Continue Lipitor which was restarted during this admission  Type II diabetes mellitus with renal manifestations [facility]): Recent A1c 11.3, poorly controlled.  Patient is not taking 70/30 insulin 10 unit twice daily currently -70/30 insulin 8 mg twice daily -Sliding scale insulin   Chronic kidney disease (CKD), stage II (mild): Baseline creatinine is~1.0.  On admission it was 1.25, continue to improve, at 1.11 today -Keep holding Cozaar until blood pressure started trending up. -Continue with IV diuresis -Monitor renal function -Nephrotoxins  Iron deficiency anemia: Stable hemoglobin, 10.0 (9.8 on 10/18/2020).  Patient is not taking iron supplement currently -Restarted iron supplement on admission.  Stage III obesity. Estimated body mass index is 41.82 kg/m as calculated from the following:   Height as of this encounter: 5\' 7"  (1.702 m).   Weight as of this encounter: 121.1 kg.  This will complicate overall prognosis..  Objective: Vitals:   12/02/20 0504 12/02/20 0840 12/02/20 1000 12/02/20 1251  BP: 112/77 122/84  122/87  Pulse: 94 99  93  Resp: 16 18  18  Temp: 98.5 F (36.9 C) 98.8 F (37.1 C)  98.6 F (37 C)  TempSrc:      SpO2: 95% 96%   99%  Weight:   121.1 kg   Height:        Intake/Output Summary (Last 24 hours) at 12/02/2020 1632 Last data filed at 12/02/2020 1515 Gross per 24 hour  Intake 240 ml  Output 3700 ml  Net -3460 ml    Filed Weights   12/01/20 0500 12/01/20 1415 12/02/20 1000  Weight: (!) 136.6 kg 124.2 kg 121.1 kg    Examination:  General.  Well-developed, obese lady, in no acute distress. Pulmonary.  Lungs clear bilaterally, normal respiratory effort. CV.  Regular rate and rhythm, no JVD, rub or murmur. Abdomen.  Soft, nontender, nondistended, BS positive. CNS.  Alert and oriented x3.  No focal neurologic deficit. Extremities.  1+ LE edema, no cyanosis, pulses intact and symmetrical. Psychiatry.  Judgment and insight appears normal.   DVT prophylaxis: Lovenox Code Status: Full Family Communication: Discussed with patient Disposition Plan:  Status is: Inpatient  Remains inpatient appropriate because:Inpatient level of care appropriate due to severity of illness  Dispo: The patient is from: Home              Anticipated d/c is to: Home              Patient currently is not medically stable to d/c.   Difficult to place patient No              Level of care: Progressive Cardiac  All the records are reviewed and case discussed with Care Management/Social Worker. Management plans discussed with the patient, nursing and they are in agreement.  Consultants:  Cardiology  Procedures:  Antimicrobials:   Data Reviewed: I have personally reviewed following labs and imaging studies  CBC: Recent Labs  Lab 11/27/20 0623 12/01/20 0658  WBC 6.3 5.8  HGB 10.0* 9.2*  HCT 33.5* 30.8*  MCV 85.9 83.5  PLT 488* 479*    Basic Metabolic Panel: Recent Labs  Lab 11/28/20 0505 11/29/20 0611 11/30/20 0739 12/01/20 0658 12/02/20 0741  NA 136 132* 137 136 137  K 4.4 4.3 4.2 3.6 3.9  CL 94* 93* 93* 93* 94*  CO2 32 28 32 31 31  GLUCOSE 98 144* 99 146* 131*  BUN 32* 32* 30* 30* 27*  CREATININE  1.19* 1.07* 1.05* 0.96 1.11*  CALCIUM 7.9* 7.6* 8.0* 7.7* 7.8*  MG 1.6*  --   --   --   --     GFR: Estimated Creatinine Clearance: 94.5 mL/min (A) (by C-G formula based on SCr of 1.11 mg/dL (H)). Liver Function Tests: No results for input(s): AST, ALT, ALKPHOS, BILITOT, PROT, ALBUMIN in the last 168 hours. No results for input(s): LIPASE, AMYLASE in the last 168 hours. No results for input(s): AMMONIA in the last 168 hours. Coagulation Profile: No results for input(s): INR, PROTIME in the last 168 hours. Cardiac Enzymes: No results for input(s): CKTOTAL, CKMB, CKMBINDEX, TROPONINI in the last 168 hours. BNP (last 3 results) No results for input(s): PROBNP in the last 8760 hours. HbA1C: No results for input(s): HGBA1C in the last 72 hours. CBG: Recent Labs  Lab 12/01/20 1157 12/01/20 1637 12/01/20 2037 12/02/20 0842 12/02/20 1253  GLUCAP 185* 205* 213* 126* 226*    Lipid Profile: No results for input(s): CHOL, HDL, LDLCALC, TRIG, CHOLHDL, LDLDIRECT in the last 72 hours. Thyroid Function Tests: No results for input(s): TSH,  T4TOTAL, FREET4, T3FREE, THYROIDAB in the last 72 hours. Anemia Panel: No results for input(s): VITAMINB12, FOLATE, FERRITIN, TIBC, IRON, RETICCTPCT in the last 72 hours. Sepsis Labs: No results for input(s): PROCALCITON, LATICACIDVEN in the last 168 hours.  Recent Results (from the past 240 hour(s))  Resp Panel by RT-PCR (Flu A&B, Covid) Nasopharyngeal Swab     Status: None   Collection Time: 11/27/20  8:02 AM   Specimen: Nasopharyngeal Swab; Nasopharyngeal(NP) swabs in vial transport medium  Result Value Ref Range Status   SARS Coronavirus 2 by RT PCR NEGATIVE NEGATIVE Final    Comment: (NOTE) SARS-CoV-2 target nucleic acids are NOT DETECTED.  The SARS-CoV-2 RNA is generally detectable in upper respiratory specimens during the acute phase of infection. The lowest concentration of SARS-CoV-2 viral copies this assay can detect is 138 copies/mL. A  negative result does not preclude SARS-Cov-2 infection and should not be used as the sole basis for treatment or other patient management decisions. A negative result may occur with  improper specimen collection/handling, submission of specimen other than nasopharyngeal swab, presence of viral mutation(s) within the areas targeted by this assay, and inadequate number of viral copies(<138 copies/mL). A negative result must be combined with clinical observations, patient history, and epidemiological information. The expected result is Negative.  Fact Sheet for Patients:  BloggerCourse.com  Fact Sheet for Healthcare Providers:  SeriousBroker.it  This test is no t yet approved or cleared by the [place] FDA and  has been authorized for detection and/or diagnosis of SARS-CoV-2 by FDA under an Emergency Use Authorization (EUA). This EUA will remain  in effect (meaning this test can be used) for the duration of the COVID-19 declaration under Section 564(b)(1) of the Act, 21 U.S.C.section 360bbb-3(b)(1), unless the authorization is terminated  or revoked sooner.       Influenza A by PCR NEGATIVE NEGATIVE Final   Influenza B by PCR NEGATIVE NEGATIVE Final    Comment: (NOTE) The Xpert Xpress SARS-CoV-2/FLU/RSV plus assay is intended as an aid in the diagnosis of influenza from Nasopharyngeal swab specimens and should not be used as a sole basis for treatment. Nasal washings and aspirates are unacceptable for Xpert Xpress SARS-CoV-2/FLU/RSV testing.  Fact Sheet for Patients: BloggerCourse.com  Fact Sheet for Healthcare Providers: SeriousBroker.it  This test is not yet approved or cleared by the [place] FDA and has been authorized for detection and/or diagnosis of SARS-CoV-2 by FDA under an Emergency Use Authorization (EUA). This EUA will remain in effect (meaning this test can  be used) for the duration of the COVID-19 declaration under Section 564(b)(1) of the Act, 21 U.S.C. section 360bbb-3(b)(1), unless the authorization is terminated or revoked.  Performed at [facility], [address], [place], [place] [address]       Radiology Studies: No results found.  Scheduled Meds:  atorvastatin  20 mg Oral Daily   carvedilol  6.25 mg Oral BID WC   dapagliflozin propanediol  10 mg Oral Daily   enoxaparin (LOVENOX) injection  0.5 mg/kg Subcutaneous Q24H   furosemide  80 mg Intravenous TID   insulin aspart  0-15 Units Subcutaneous TID WC   insulin aspart  0-5 Units Subcutaneous QHS   insulin aspart protamine- aspart  8 Units Subcutaneous BID WC   iron polysaccharides  150 mg Oral Daily   metolazone  5 mg Oral Daily   polyethylene glycol  17 g Oral Daily   sacubitril-valsartan  1 tablet Oral BID   spironolactone  25 mg  Oral Daily   Continuous Infusions:   LOS: 5 days   Time spent: 30 minutes. More than 50% of the time was spent in counseling/coordination of care  Arnetha Courser, MD [facility]  If 7PM-7AM, please contact night-coverage [email or web address]  12/02/2020, 4:32 PM   This record has been created using Conservation officer, historic buildings. Errors have been sought and corrected,but may not always be located. Such creation errors do not reflect on the standard of care.

## 2020-12-03 LAB — BASIC METABOLIC PANEL
Anion gap: 9 (ref 5–15)
BUN: 26 mg/dL — ABNORMAL HIGH (ref 6–20)
CO2: 36 mmol/L — ABNORMAL HIGH (ref 22–32)
Calcium: 7.9 mg/dL — ABNORMAL LOW (ref 8.9–10.3)
Chloride: 91 mmol/L — ABNORMAL LOW (ref 98–111)
Creatinine, Ser: 1.09 mg/dL — ABNORMAL HIGH (ref 0.44–1.00)
GFR, Estimated: >60 mL/min (ref >60)
Glucose, Bld: 99 mg/dL (ref 70–99)
Potassium: 3.5 mmol/L (ref 3.5–5.1)
Sodium: 136 mmol/L (ref 135–145)

## 2020-12-03 LAB — GLUCOSE, CAPILLARY
Glucose-Capillary: 86 mg/dL (ref 70–99)
Glucose-Capillary: 180 mg/dL — ABNORMAL HIGH (ref 70–99)
Glucose-Capillary: 288 mg/dL — ABNORMAL HIGH (ref 70–99)
Glucose-Capillary: 288 mg/dL — ABNORMAL HIGH (ref 70–99)

## 2020-12-03 MED ORDER — ENOXAPARIN SODIUM 60 MG/0.6ML IJ SOSY
0.5000 mg/kg | PREFILLED_SYRINGE | INTRAMUSCULAR | Status: DC
  Administered 2020-12-03 – 2020-12-07 (×4): 57.5 mg via SUBCUTANEOUS
  Filled 2020-12-03 (×7): qty 0.57

## 2020-12-03 MED ORDER — POTASSIUM CHLORIDE CRYS ER 20 MEQ PO TBCR
40.0000 meq | EXTENDED_RELEASE_TABLET | Freq: Once | ORAL | Status: AC
  Administered 2020-12-03: 40 meq via ORAL
  Filled 2020-12-03: qty 2

## 2020-12-03 NOTE — Progress Notes (Signed)
PROGRESS NOTE    KRISTAL BUDZ  ONG:[id number] DOB: 1986-04-28 DOA: 11/27/2020 PCP: Patient, No Pcp Per (Inactive)   Brief Narrative: Taken from H&P. Molly Howard is a 35 y.o. female with medical history significant of nonischemic cardiomyopathy and CHF with EF 30 to 35%, hypertension, hyperlipidemia, diabetes mellitus, GERD, anemia, former smoker, CKD-2, polysubstance abuse, who presents with shortness breath.   Patient states that her shortness breath has been going on for more than 1 week, which has been progressively worsening.  Patient also has dry cough, worsening bilateral leg edema, generalized weakness, approximately 40 pounds of weight gain in the past 2 weeks.  She has orthopnea.  Her shortness breath is worse when laying flat.  Denies chest pain, fever or chills.  Found to have BNP of 1043, COVID PCR negative, mild AKI and chest x-ray positive for cardiomegaly and vascular congestion.  Patient admitted for acute on chronic HFrEF with volume overload and started on IV diuresis.  Cardiology is on board.  Subjective: Patient continues to have some exertional dyspnea and orthopnea.  Peripheral edema slowly improving..  Sitting at the edge of bed  Assessment & Plan:   Principal Problem:   Acute on chronic combined systolic and diastolic CHF (congestive heart failure) (HCC) Active Problems:   Essential hypertension   HLD (hyperlipidemia)   Type II diabetes mellitus with renal manifestations (HCC)   Chronic kidney disease (CKD), stage II (mild)   Iron deficiency anemia  Acute on chronic combined systolic and diastolic CHF (congestive heart failure) (HCC): 2D echo on 10/11/2020 showed EF of 30-35% with grade 2 diastolic dysfunction.  Patient has orthopnea, 40 pounds of weight gain, elevated BNP 1043, vascular congestion on chest x-ray, clinically consistent with CHF exacerbation. Recent admissions due to similar reasons, saw cardiology on 11/13/2020 and was advised to take her  medications. Apparently she has not picked up her medications from pharmacy yet. Mild worsening of creatinine-cardiology would like to continue current management. -Cardiology following -Continue Lasix to 80 mg 3 times a day, spironolactone 25 mg once daily and metolazone 5 mg p.o. daily. Net IO Since Admission: -17,220 mL [12/03/20 1720]  -Strict intake and output -Daily weight and BMP  Hypokalemia Replete and recheck  Essential hypertension.  Patient was not taking her blood pressure medications either. Blood pressure currently within goal. -Continue with Coreg, spironolactone, Entresto, metolazone along with Lasix. -Holding Cozaar due to AKI-can be resumed if needed as renal function improved.  HLD (hyperlipidemia):  -Continue Lipitor which was restarted during this admission  Type II diabetes mellitus with renal manifestations [facility]): Recent A1c 11.3, poorly controlled.   -70/30 insulin 8 mg twice daily -Sliding scale insulin and Farxiga   Chronic kidney disease (CKD), stage II (mild): Baseline creatinine is~1.0.  On admission it was 1.25, continue to improve, at 1.09 today -Keep holding Cozaar until blood pressure started trending up. -Continue with IV diuresis -Monitor renal function  Iron deficiency anemia: Stable hemoglobin, 9.2 (9.8 on 10/18/2020).  Patient is not taking iron supplement currently -Restarted iron supplement on admission.  Polysubstance abuse and medication noncompliance UDS positive for cocaine and opiate.  Counseled. Patient will need medication assistance on discharge. -TOC aware  Stage III obesity. Estimated body mass index is 39.99 kg/m as calculated from the following:   Height as of this encounter: 5\' 7"  (1.702 m).   Weight as of this encounter: 115.8 kg.  This complicates overall prognosis..  Objective: Vitals:   12/03/20 0410 12/03/20 0825 12/03/20 1211 12/03/20  1617  BP: 117/82 111/82 (!) 118/95 127/81  Pulse: 93 98 94 91  Resp: 18  17 18    Temp: 98.4 F (36.9 C)  (!) 97.5 F (36.4 C) 98.1 F (36.7 C)  TempSrc: Oral  Oral   SpO2: 95% 97% 97% 98%  Weight:  115.8 kg    Height:        Intake/Output Summary (Last 24 hours) at 12/03/2020 1719 Last data filed at 12/03/2020 1520 Gross per 24 hour  Intake 720 ml  Output 3500 ml  Net -2780 ml   Filed Weights   12/02/20 1000 12/03/20 0117 12/03/20 0825  Weight: 121.1 kg (!) 177.8 kg 115.8 kg    Examination:  General.  Well-developed, obese lady, in no acute distress. Pulmonary.  Lungs clear bilaterally, normal respiratory effort. CV.  Regular rate and rhythm, no JVD, rub or murmur. Abdomen.  Soft, nontender, nondistended, BS positive. CNS.  Alert and oriented x3.  No focal neurologic deficit. Extremities.  1+ LE edema, no cyanosis, pulses intact and symmetrical. Psychiatry.  Judgment and insight appears normal.   DVT prophylaxis: Lovenox Code Status: Full Family Communication: Discussed with patient Disposition Plan:  Status is: Inpatient  Remains inpatient appropriate because:Inpatient level of care appropriate due to severity of illness, needing aggressive diuresis  Dispo: The patient is from: Home              Anticipated d/c is to: Home in next 2 to 3 days              Patient currently is not medically stable to d/c.   Difficult to place patient No              Level of care: Progressive Cardiac  All the records are reviewed and case discussed with Care Management/Social Worker. Management plans discussed with the patient, nursing and they are in agreement.  Consultants:  Cardiology  Procedures:  Antimicrobials: None  Data Reviewed: I have personally reviewed following labs and imaging studies  CBC: Recent Labs  Lab 11/27/20 0623 12/01/20 0658  WBC 6.3 5.8  HGB 10.0* 9.2*  HCT 33.5* 30.8*  MCV 85.9 83.5  PLT 488* 479*   Basic Metabolic Panel: Recent Labs  Lab 11/28/20 0505 11/29/20 0611 11/30/20 0739 12/01/20 0658 12/02/20 0741  12/03/20 0342  NA 136 132* 137 136 137 136  K 4.4 4.3 4.2 3.6 3.9 3.5  CL 94* 93* 93* 93* 94* 91*  CO2 32 28 32 31 31 36*  GLUCOSE 98 144* 99 146* 131* 99  BUN 32* 32* 30* 30* 27* 26*  CREATININE 1.19* 1.07* 1.05* 0.96 1.11* 1.09*  CALCIUM 7.9* 7.6* 8.0* 7.7* 7.8* 7.9*  MG 1.6*  --   --   --   --   --    GFR: Estimated Creatinine Clearance: 93.8 mL/min (A) (by C-G formula based on SCr of 1.09 mg/dL (H)). Liver Function Tests: No results for input(s): AST, ALT, ALKPHOS, BILITOT, PROT, ALBUMIN in the last 168 hours. No results for input(s): LIPASE, AMYLASE in the last 168 hours. No results for input(s): AMMONIA in the last 168 hours. Coagulation Profile: No results for input(s): INR, PROTIME in the last 168 hours. Cardiac Enzymes: No results for input(s): CKTOTAL, CKMB, CKMBINDEX, TROPONINI in the last 168 hours. BNP (last 3 results) No results for input(s): PROBNP in the last 8760 hours. HbA1C: No results for input(s): HGBA1C in the last 72 hours. CBG: Recent Labs  Lab 12/02/20 1715 12/02/20 2202 12/03/20 02/02/21  12/03/20 1209 12/03/20 1621  GLUCAP 238* 255* 86 180* 288*   Lipid Profile: No results for input(s): CHOL, HDL, LDLCALC, TRIG, CHOLHDL, LDLDIRECT in the last 72 hours. Thyroid Function Tests: No results for input(s): TSH, T4TOTAL, FREET4, T3FREE, THYROIDAB in the last 72 hours. Anemia Panel: No results for input(s): VITAMINB12, FOLATE, FERRITIN, TIBC, IRON, RETICCTPCT in the last 72 hours. Sepsis Labs: No results for input(s): PROCALCITON, LATICACIDVEN in the last 168 hours.  Recent Results (from the past 240 hour(s))  Resp Panel by RT-PCR (Flu A&B, Covid) Nasopharyngeal Swab     Status: None   Collection Time: 11/27/20  8:02 AM   Specimen: Nasopharyngeal Swab; Nasopharyngeal(NP) swabs in vial transport medium  Result Value Ref Range Status   SARS Coronavirus 2 by RT PCR NEGATIVE NEGATIVE Final    Comment: (NOTE) SARS-CoV-2 target nucleic acids are NOT  DETECTED.  The SARS-CoV-2 RNA is generally detectable in upper respiratory specimens during the acute phase of infection. The lowest concentration of SARS-CoV-2 viral copies this assay can detect is 138 copies/mL. A negative result does not preclude SARS-Cov-2 infection and should not be used as the sole basis for treatment or other patient management decisions. A negative result may occur with  improper specimen collection/handling, submission of specimen other than nasopharyngeal swab, presence of viral mutation(s) within the areas targeted by this assay, and inadequate number of viral copies(<138 copies/mL). A negative result must be combined with clinical observations, patient history, and epidemiological information. The expected result is Negative.  Fact Sheet for Patients:  BloggerCourse.com  Fact Sheet for Healthcare Providers:  SeriousBroker.it  This test is no t yet approved or cleared by the [place] FDA and  has been authorized for detection and/or diagnosis of SARS-CoV-2 by FDA under an Emergency Use Authorization (EUA). This EUA will remain  in effect (meaning this test can be used) for the duration of the COVID-19 declaration under Section 564(b)(1) of the Act, 21 U.S.C.section 360bbb-3(b)(1), unless the authorization is terminated  or revoked sooner.       Influenza A by PCR NEGATIVE NEGATIVE Final   Influenza B by PCR NEGATIVE NEGATIVE Final    Comment: (NOTE) The Xpert Xpress SARS-CoV-2/FLU/RSV plus assay is intended as an aid in the diagnosis of influenza from Nasopharyngeal swab specimens and should not be used as a sole basis for treatment. Nasal washings and aspirates are unacceptable for Xpert Xpress SARS-CoV-2/FLU/RSV testing.  Fact Sheet for Patients: BloggerCourse.com  Fact Sheet for Healthcare Providers: SeriousBroker.it  This test is not yet  approved or cleared by the [place] FDA and has been authorized for detection and/or diagnosis of SARS-CoV-2 by FDA under an Emergency Use Authorization (EUA). This EUA will remain in effect (meaning this test can be used) for the duration of the COVID-19 declaration under Section 564(b)(1) of the Act, 21 U.S.C. section 360bbb-3(b)(1), unless the authorization is terminated or revoked.  Performed at [facility], [address], [place], [place] [address]       Radiology Studies: No results found.  Scheduled Meds:  atorvastatin  20 mg Oral Daily   carvedilol  6.25 mg Oral BID WC   dapagliflozin propanediol  10 mg Oral Daily   enoxaparin (LOVENOX) injection  0.5 mg/kg Subcutaneous Q24H   furosemide  80 mg Intravenous TID   insulin aspart  0-15 Units Subcutaneous TID WC   insulin aspart  0-5 Units Subcutaneous QHS   insulin aspart protamine- aspart  8 Units Subcutaneous BID WC  iron polysaccharides  150 mg Oral Daily   metolazone  5 mg Oral Daily   polyethylene glycol  17 g Oral Daily   sacubitril-valsartan  1 tablet Oral BID   spironolactone  25 mg Oral Daily   Continuous Infusions:   LOS: 6 days   Time spent: 30 minutes. More than 50% of the time was spent in counseling/coordination of care  Delfino Lovett, MD [facility]  If 7PM-7AM, please contact night-coverage [email or web address]  12/03/2020, 5:19 PM   This record has been created using Conservation officer, historic buildings. Errors have been sought and corrected,but may not always be located. Such creation errors do not reflect on the standard of care.

## 2020-12-03 NOTE — Progress Notes (Signed)
Progress Note  Patient Name: Molly Howard Date of Encounter: 12/03/2020  Primary Cardiologist: Debbe Odea, MD   Subjective   No CP. No SOB. Improving LEE, abdominal distention. Reports ongoing tachypalpitations at times.  Inpatient Medications    Scheduled Meds:  atorvastatin  20 mg Oral Daily   carvedilol  6.25 mg Oral BID WC   dapagliflozin propanediol  10 mg Oral Daily   enoxaparin (LOVENOX) injection  0.5 mg/kg Subcutaneous Q24H   furosemide  80 mg Intravenous TID   insulin aspart  0-15 Units Subcutaneous TID WC   insulin aspart  0-5 Units Subcutaneous QHS   insulin aspart protamine- aspart  8 Units Subcutaneous BID WC   iron polysaccharides  150 mg Oral Daily   metolazone  5 mg Oral Daily   polyethylene glycol  17 g Oral Daily   sacubitril-valsartan  1 tablet Oral BID   spironolactone  25 mg Oral Daily   Continuous Infusions:  PRN Meds: acetaminophen, albuterol, chlorpheniramine-HYDROcodone, dextromethorphan-guaiFENesin, hydrALAZINE, ondansetron (ZOFRAN) IV   Vital Signs    Vitals:   12/02/20 1922 12/03/20 0117 12/03/20 0410 12/03/20 0825  BP: (!) 129/93  117/82 111/82  Pulse: 96  93 98  Resp: 20  18   Temp: 98.7 F (37.1 C)  98.4 F (36.9 C)   TempSrc: Oral  Oral   SpO2: 98%  95% 97%  Weight:  (!) 177.8 kg    Height:        Intake/Output Summary (Last 24 hours) at 12/03/2020 0921 Last data filed at 12/03/2020 0300 Gross per 24 hour  Intake 480 ml  Output 4100 ml  Net -3620 ml    Last 3 Weights 12/03/2020 12/02/2020 12/01/2020  Weight (lbs) 391 lb 14.4 oz 267 lb 273 lb 13 oz  Weight (kg) 177.765 kg 121.11 kg 124.2 kg  Some encounter information is confidential and restricted. Go to Review Flowsheets activity to see all data.      Telemetry    NSR, ST, 13 beat NSVT - Personally Reviewed  ECG    No new tracings - Personally Reviewed  Physical Exam   GEN: Obese female. No acute distress.   Neck: Unable to assess JVD due to body habitus,  position of pt Cardiac: tachycardic but regular, no murmurs, rubs, or gallops.  Respiratory: Reduced breath sounds bilaterally. GI: Firm, distended, not tender  MS: Moderate 1+ bilateral woody edema; No deformity. Neuro:  Nonfocal  Psych: Normal affect   Labs    High Sensitivity Troponin:   Recent Labs  Lab 11/27/20 0623  TROPONINIHS 13       Chemistry Recent Labs  Lab 12/01/20 0658 12/02/20 0741 12/03/20 0342  NA 136 137 136  K 3.6 3.9 3.5  CL 93* 94* 91*  CO2 31 31 36*  GLUCOSE 146* 131* 99  BUN 30* 27* 26*  CREATININE 0.96 1.11* 1.09*  CALCIUM 7.7* 7.8* 7.9*  GFRNONAA >60 >60 >60  ANIONGAP 12 12 9       Hematology Recent Labs  Lab 11/27/20 0623 12/01/20 0658  WBC 6.3 5.8  RBC 3.90 3.69*  HGB 10.0* 9.2*  HCT 33.5* 30.8*  MCV 85.9 83.5  MCH 25.6* 24.9*  MCHC 29.9* 29.9*  RDW 18.2* 17.9*  PLT 488* 479*     BNP Recent Labs  Lab 11/27/20 0623  BNP 1,043.9*      DDimer No results for input(s): DDIMER in the last 168 hours.   Radiology    No results found.  Cardiac  Studies   2D Echocardiogram 6.2022 1. Left ventricular ejection fraction, by estimation, is 30 to 35%. The  left ventricle has moderate to severely decreased function. The left  ventricle demonstrates global hypokinesis. Left ventricular diastolic  parameters are consistent with Grade II  diastolic dysfunction (pseudonormalization).   2. Right ventricular systolic function is mildly reduced. The right  ventricular size is normal.   3. A small pericardial effusion is present.   4. The mitral valve is normal in structure. No evidence of mitral valve  regurgitation.   5. Tricuspid valve regurgitation is moderate.   6. The aortic valve is tricuspid. Aortic valve regurgitation is not  visualized.  Patient Profile     35 y.o. female with history of HFrEF, NICM, HTN, DMII, CKD II, GERD, microcytic anemia, obesity, medication nonadherarnce, and polysubstance abuse, who was admitted  11/27/2020 with worsening dyspnea and volume overload in the setting of noncompliance.  Assessment & Plan    Acute on chronic combined systolic and diastolic congestive heart failure NICM --Still volume up on exam and not at baseline.  --Continue IV Lasix 80mg  TID and metolazone as Cr/BP allows. Net -15.8L for admission and -3.6L yesterday. Wt not accurate as increased overnight from 121.1kg  177.8kg. Recommend standing weights.  --Daily BMET.   Cr 1.11  1.09, BUN 27  26.  K 3.5. Ordered  KCL tab x1. May need daily K supplementation on top of spironolactone with diuresis.  --Continue carvedilol, Entresto, spironolactone, and .  Hypokalemia  --As above. K 3.5. Ordered  KCL tab [place] x1. May need daily K supplementation on top of spironolactone with diuresis.   Essential hypertension --Well controlled. Continue Coreg, lasix, Entresto, spironolactone  DM2 --A1c 11.3.  Unable to afford insulin.  Farxiga added 7/30.  Will need social work/medication assistance program.  Per IM.  CKD stage II --Daily BMET.  Continue to monitor.  Cr stable.  Polysubstance use --UDS positive for cocaine, opiates. --Complete cessation advised.  Medication noncompliance --Recommend SW consultation / medicaiton assistance paperwork.     For questions or updates, please contact [facility] Please consult [email or web address] for contact info under        Signed, 8/30, PA-C  12/03/2020, 9:21 AM

## 2020-12-04 DIAGNOSIS — Z9114 Patient's other noncompliance with medication regimen: Secondary | ICD-10-CM

## 2020-12-04 DIAGNOSIS — I428 Other cardiomyopathies: Secondary | ICD-10-CM

## 2020-12-04 LAB — CBC
HCT: 30.1 % — ABNORMAL LOW (ref 36.0–46.0)
Hemoglobin: 9.2 g/dL — ABNORMAL LOW (ref 12.0–15.0)
MCH: 25.7 pg — ABNORMAL LOW (ref 26.0–34.0)
MCHC: 30.6 g/dL (ref 30.0–36.0)
MCV: 84.1 fL (ref 80.0–100.0)
Platelets: 463 10*3/uL — ABNORMAL HIGH (ref 150–400)
RBC: 3.58 MIL/uL — ABNORMAL LOW (ref 3.87–5.11)
RDW: 18 % — ABNORMAL HIGH (ref 11.5–15.5)
WBC: 5 10*3/uL (ref 4.0–10.5)
nRBC: 0 % (ref 0.0–0.2)

## 2020-12-04 LAB — GLUCOSE, CAPILLARY
Glucose-Capillary: 120 mg/dL — ABNORMAL HIGH (ref 70–99)
Glucose-Capillary: 177 mg/dL — ABNORMAL HIGH (ref 70–99)
Glucose-Capillary: 240 mg/dL — ABNORMAL HIGH (ref 70–99)
Glucose-Capillary: 200 mg/dL — ABNORMAL HIGH (ref 70–99)

## 2020-12-04 MED ORDER — POTASSIUM CHLORIDE CRYS ER 20 MEQ PO TBCR
20.0000 meq | EXTENDED_RELEASE_TABLET | Freq: Two times a day (BID) | ORAL | Status: DC
  Administered 2020-12-04 – 2020-12-08 (×9): 20 meq via ORAL
  Filled 2020-12-04 (×10): qty 1

## 2020-12-04 NOTE — Progress Notes (Signed)
Progress Note  Patient Name: Molly Howard Date of Encounter: 12/04/2020  Primary Cardiologist: Agbor-Etang  Subjective   No chest pain or palpitations. Dyspnea and volume status much improved. Documented UOP 3.6 L for the past 24 hours, net - 19.5 L for the admission. Weight 115.8-->107.6 kg. Renal function stable.   Inpatient Medications    Scheduled Meds:  atorvastatin  20 mg Oral Daily   carvedilol  6.25 mg Oral BID WC   dapagliflozin propanediol  10 mg Oral Daily   enoxaparin (LOVENOX) injection  0.5 mg/kg Subcutaneous Q24H   furosemide  80 mg Intravenous TID   insulin aspart  0-15 Units Subcutaneous TID WC   insulin aspart  0-5 Units Subcutaneous QHS   insulin aspart protamine- aspart  8 Units Subcutaneous BID WC   iron polysaccharides  150 mg Oral Daily   metolazone  5 mg Oral Daily   polyethylene glycol  17 g Oral Daily   sacubitril-valsartan  1 tablet Oral BID   spironolactone  25 mg Oral Daily   Continuous Infusions:  PRN Meds: acetaminophen, albuterol, chlorpheniramine-HYDROcodone, dextromethorphan-guaiFENesin, hydrALAZINE, ondansetron (ZOFRAN) IV   Vital Signs    Vitals:   12/03/20 1950 12/04/20 0042 12/04/20 0443 12/04/20 0814  BP: 127/88  114/68 118/68  Pulse: 91  94 95  Resp: 18  17 18   Temp: 98.2 F (36.8 C)  98.6 F (37 C) 98.1 F (36.7 C)  TempSrc: Oral  Oral   SpO2: 99%  94% 98%  Weight:  107.6 kg    Height:        Intake/Output Summary (Last 24 hours) at 12/04/2020 0945 Last data filed at 12/04/2020 0445 Gross per 24 hour  Intake 760 ml  Output 4400 ml  Net -3640 ml   Filed Weights   12/03/20 0117 12/03/20 0825 12/04/20 0042  Weight: (!) 177.8 kg 115.8 kg 107.6 kg    Telemetry    SR - Personally Reviewed  ECG    No new tracings - Personally Reviewed  Physical Exam   GEN: No acute distress.   Neck: No JVD. Cardiac: RRR, no murmurs, rubs, or gallops.  Respiratory: Mildly diminished breath sounds bilaterally.  GI: Firm,  nontender, mildly distended.   MS: 1+ bilateral woody edema; No deformity. Neuro:  Alert and oriented x 3; Nonfocal.  Psych: Normal affect.  Labs    Chemistry Recent Labs  Lab 12/01/20 0658 12/02/20 0741 12/03/20 0342  NA 136 137 136  K 3.6 3.9 3.5  CL 93* 94* 91*  CO2 31 31 36*  GLUCOSE 146* 131* 99  BUN 30* 27* 26*  CREATININE 0.96 1.11* 1.09*  CALCIUM 7.7* 7.8* 7.9*  GFRNONAA >60 >60 >60  ANIONGAP 12 12 9      Hematology Recent Labs  Lab 12/01/20 0658 12/04/20 0559  WBC 5.8 5.0  RBC 3.69* 3.58*  HGB 9.2* 9.2*  HCT 30.8* 30.1*  MCV 83.5 84.1  MCH 24.9* 25.7*  MCHC 29.9* 30.6  RDW 17.9* 18.0*  PLT 479* 463*    Cardiac EnzymesNo results for input(s): TROPONINI in the last 168 hours. No results for input(s): TROPIPOC in the last 168 hours.   BNPNo results for input(s): BNP, PROBNP in the last 168 hours.   DDimer No results for input(s): DDIMER in the last 168 hours.   Radiology    No results found.  Cardiac Studies   2D echo 10/2020: 1. Left ventricular ejection fraction, by estimation, is 30 to 35%. The  left ventricle  has moderate to severely decreased function. The left  ventricle demonstrates global hypokinesis. Left ventricular diastolic  parameters are consistent with Grade II  diastolic dysfunction (pseudonormalization).   2. Right ventricular systolic function is mildly reduced. The right  ventricular size is normal.   3. A small pericardial effusion is present.   4. The mitral valve is normal in structure. No evidence of mitral valve  regurgitation.   5. Tricuspid valve regurgitation is moderate.   6. The aortic valve is tricuspid. Aortic valve regurgitation is not  visualized.  __________  2D echo 05/2020: 1. Left ventricular ejection fraction, by estimation, is 20 to 25%. The  left ventricle has severely decreased function. The left ventricle  demonstrates global hypokinesis. The left ventricular internal cavity size  was mildly dilated.  Indeterminate  diastolic filling due to E-A fusion.   2. Right ventricular systolic function is normal. The right ventricular  size is mildly enlarged. There is severely elevated pulmonary artery  systolic pressure.   3. The mitral valve is normal in structure. Trivial mitral valve  regurgitation. No evidence of mitral stenosis.   4. Tricuspid valve regurgitation is moderate.   5. The aortic valve was not well visualized. Aortic valve regurgitation  is not visualized. No aortic stenosis is present.   6. The inferior vena cava is dilated in size with <50% respiratory  variability, suggesting right atrial pressure of 15 mmHg. __________  Eugenie Birks MPI 10/2019: There was no ST segment deviation noted during stress. T wave inversion was noted during stress in the I and aVL leads. Defect 1: There is a small defect of mild severity present in the apex location. This is likely due to breast attenuation artifact Nuclear stress EF: 30%. This is a high risk study due to low EF. Low EF seems to be due to nonischemic cardiomyopathy. __________  2D echo 10/2019: 1. Left ventricular ejection fraction, by estimation, is 35 to 40%. The  left ventricle has moderately decreased function. The left ventricle  demonstrates global hypokinesis. Left ventricular diastolic parameters are  consistent with Grade II diastolic  dysfunction (pseudonormalization).   2. Right ventricular systolic function is normal. The right ventricular  size is normal.   3. The mitral valve is normal in structure. No evidence of mitral valve  regurgitation. No evidence of mitral stenosis.   4. The aortic valve is normal in structure. Aortic valve regurgitation is  not visualized. No aortic stenosis is present.   5. The inferior vena cava is normal in size with greater than 50%  respiratory variability, suggesting right atrial pressure of 3 mmHg.   Patient Profile     35 y.o. female with history of chronic combined systolic  and diastolic CHF secondary to NICM, polysubstance abuse with ongoing cocaine use poorly controlled DM2, CKD stage II, anemia, HLD, obesity, and GERD who we are evaluating for acute on chronic HFrEF in the context of medication nonadherence and ongoing cocaine use.  Assessment & Plan    1.  Acute on chronic combined systolic and diastolic CHF secondary to NICM: -Volume status is improving, though she does remain volume up -Readmission likely in the setting of medication nonadherence and ongoing polysubstance abuse -Continue IV Lasix 80 mg 3 times daily and metolazone 5 mg daily with KCl repletion as indicated -She will also continue carvedilol, Entresto, spironolactone and Farxiga -Daily weights -Strict I's and O's  2.  Polysubstance abuse: -She has denied cocaine use since 10/2020, however urine drug screen was positive for  cocaine and opiates upon this admission -Complete cessation is recommended  3.  Noncompliance: -She indicates she cannot afford medications, does not work, and does not have health insurance -She will need TOC assistance in obtaining co-pay cards and medication assistance program paperwork prior to discharge  4.  CKD stage II: -Stable -Monitor with diuresis    For questions or updates, please contact [facility] Please consult [email or web address] for contact info under Cardiology/STEMI.    Signed, Eula Listen, PA-C [facility] Pager: [phone] 12/04/2020, 9:45 AM

## 2020-12-04 NOTE — Progress Notes (Signed)
PROGRESS NOTE    Molly Howard  HBZ:[id number] DOB: 1985-09-27 DOA: 11/27/2020 PCP: Patient, No Pcp Per (Inactive)   Brief Narrative: Taken from H&P. Molly Howard is a 35 y.o. female with medical history significant of nonischemic cardiomyopathy and CHF with EF 30 to 35%, hypertension, hyperlipidemia, diabetes mellitus, GERD, anemia, former smoker, CKD-2, polysubstance abuse, who presents with shortness breath.   Patient states that her shortness breath has been going on for more than 1 week, which has been progressively worsening.  Patient also has dry cough, worsening bilateral leg edema, generalized weakness, approximately 40 pounds of weight gain in the past 2 weeks.  She has orthopnea.  Her shortness breath is worse when laying flat.  Denies chest pain, fever or chills.  Found to have BNP of 1043, COVID PCR negative, mild AKI and chest x-ray positive for cardiomegaly and vascular congestion.  Patient admitted for acute on chronic HFrEF with volume overload and started on IV diuresis.  Cardiology is on board.  Subjective: Her symptoms are slowly improving still feels short of breath and has lower extremity edema.  Appreciative of our efforts here Assessment & Plan:   Principal Problem:   Acute on chronic combined systolic and diastolic CHF (congestive heart failure) (HCC) Active Problems:   Essential hypertension   HLD (hyperlipidemia)   Type II diabetes mellitus with renal manifestations (HCC)   Chronic kidney disease (CKD), stage II (mild)   Iron deficiency anemia  Acute on chronic combined systolic and diastolic CHF (congestive heart failure) (HCC): 2D echo on 10/11/2020 showed EF of 30-35% with grade 2 diastolic dysfunction.  Patient has orthopnea, 40 pounds of weight gain, elevated BNP 1043, vascular congestion on chest x-ray, clinically consistent with CHF exacerbation. Recent admissions due to similar reasons, saw cardiology on 11/13/2020 and was advised to take her medications.  Apparently she has not picked up her medications from pharmacy yet. -Cardiology following and appreciate their input -Continue Lasix to 80 mg 3 times a day, spironolactone 25 mg once daily and metolazone 5 mg p.o. daily. Net IO Since Admission: -20,980 mL [12/04/20 1657]  -Strict intake and output -Daily weight and BMP  Hypokalemia Repleted and resolved  Essential hypertension.  Patient was not taking her blood pressure medications either. Blood pressure currently within goal. -Continue with Coreg, spironolactone, Entresto, metolazone along with Lasix. -Holding Cozaar due to AKI-can be resumed if needed as renal function improved.  HLD (hyperlipidemia):  -Continue Lipitor   Type II diabetes mellitus with renal manifestations [facility]): Recent A1c 11.3, poorly controlled.   -70/30 insulin 8 mg twice daily -Sliding scale insulin and Farxiga   Chronic kidney disease (CKD), stage II (mild): Baseline creatinine is~1.0.  On admission it was 1.25, continue to improve, at 1.09 today -Keep holding Cozaar for now -Continue with IV diuresis -Monitor renal function  Iron deficiency anemia: Stable hemoglobin, 9.2 (9.8 on 10/18/2020).  Patient is not taking iron supplement currently -Restarted iron supplement on admission.  Polysubstance abuse and medication noncompliance UDS positive for cocaine and opiate.  Counseled. Patient will need medication assistance on discharge. -TOC aware  Stage III obesity. Estimated body mass index is 37.17 kg/m as calculated from the following:   Height as of this encounter: 5\' 7"  (1.702 m).   Weight as of this encounter: 107.6 kg.  This complicates overall prognosis..  Objective: Vitals:   12/04/20 0443 12/04/20 0814 12/04/20 1129 12/04/20 1449  BP: 114/68 118/68 113/81 112/86  Pulse: 94 95 89 90  Resp: 17  18 17 18   Temp: 98.6 F (37 C) 98.1 F (36.7 C) 98.2 F (36.8 C) 98.1 F (36.7 C)  TempSrc: Oral  Oral   SpO2: 94% 98% 96% 98%  Weight:       Height:        Intake/Output Summary (Last 24 hours) at 12/04/2020 1657 Last data filed at 12/04/2020 1453 Gross per 24 hour  Intake 1240 ml  Output 5000 ml  Net -3760 ml   Filed Weights   12/03/20 0117 12/03/20 0825 12/04/20 0042  Weight: (!) 177.8 kg 115.8 kg 107.6 kg    Examination:  General.  Well-developed, obese lady, in no acute distress. Pulmonary.  Lungs clear bilaterally, normal respiratory effort. CV.  Regular rate and rhythm, no JVD, rub or murmur. Abdomen.  Soft, nontender, nondistended, BS positive. CNS.  Alert and oriented x3.  No focal neurologic deficit. Extremities.  1+ LE edema, no cyanosis, pulses intact and symmetrical. Psychiatry.  Judgment and insight appears normal.   DVT prophylaxis: Lovenox Code Status: Full Family Communication: Discussed with patient Disposition Plan:  Status is: Inpatient  Remains inpatient appropriate because:Inpatient level of care appropriate due to severity of illness, needing aggressive diuresis  Dispo: The patient is from: Home              Anticipated d/c is to: Home in next 2 to 3 days              Patient currently is not medically stable to d/c.   Difficult to place patient No              Level of care: Progressive Cardiac  All the records are reviewed and case discussed with Care Management/Social Worker. Management plans discussed with the patient, nursing and they are in agreement.  Consultants:  Cardiology  Procedures:  Antimicrobials: None  Data Reviewed: I have personally reviewed following labs and imaging studies  CBC: Recent Labs  Lab 12/01/20 0658 12/04/20 0559  WBC 5.8 5.0  HGB 9.2* 9.2*  HCT 30.8* 30.1*  MCV 83.5 84.1  PLT 479* 463*   Basic Metabolic Panel: Recent Labs  Lab 11/28/20 0505 11/29/20 0611 11/30/20 0739 12/01/20 0658 12/02/20 0741 12/03/20 0342  NA 136 132* 137 136 137 136  K 4.4 4.3 4.2 3.6 3.9 3.5  CL 94* 93* 93* 93* 94* 91*  CO2 32 28 32 31 31 36*  GLUCOSE 98  144* 99 146* 131* 99  BUN 32* 32* 30* 30* 27* 26*  CREATININE 1.19* 1.07* 1.05* 0.96 1.11* 1.09*  CALCIUM 7.9* 7.6* 8.0* 7.7* 7.8* 7.9*  MG 1.6*  --   --   --   --   --    GFR: Estimated Creatinine Clearance: 90.1 mL/min (A) (by C-G formula based on SCr of 1.09 mg/dL (H)). Liver Function Tests: No results for input(s): AST, ALT, ALKPHOS, BILITOT, PROT, ALBUMIN in the last 168 hours. No results for input(s): LIPASE, AMYLASE in the last 168 hours. No results for input(s): AMMONIA in the last 168 hours. Coagulation Profile: No results for input(s): INR, PROTIME in the last 168 hours. Cardiac Enzymes: No results for input(s): CKTOTAL, CKMB, CKMBINDEX, TROPONINI in the last 168 hours. BNP (last 3 results) No results for input(s): PROBNP in the last 8760 hours. HbA1C: No results for input(s): HGBA1C in the last 72 hours. CBG: Recent Labs  Lab 12/03/20 1621 12/03/20 1952 12/04/20 0815 12/04/20 1128 12/04/20 1642  GLUCAP 288* 288* 120* 177* 240*   Lipid Profile: No  results for input(s): CHOL, HDL, LDLCALC, TRIG, CHOLHDL, LDLDIRECT in the last 72 hours. Thyroid Function Tests: No results for input(s): TSH, T4TOTAL, FREET4, T3FREE, THYROIDAB in the last 72 hours. Anemia Panel: No results for input(s): VITAMINB12, FOLATE, FERRITIN, TIBC, IRON, RETICCTPCT in the last 72 hours. Sepsis Labs: No results for input(s): PROCALCITON, LATICACIDVEN in the last 168 hours.  Recent Results (from the past 240 hour(s))  Resp Panel by RT-PCR (Flu A&B, Covid) Nasopharyngeal Swab     Status: None   Collection Time: 11/27/20  8:02 AM   Specimen: Nasopharyngeal Swab; Nasopharyngeal(NP) swabs in vial transport medium  Result Value Ref Range Status   SARS Coronavirus 2 by RT PCR NEGATIVE NEGATIVE Final    Comment: (NOTE) SARS-CoV-2 target nucleic acids are NOT DETECTED.  The SARS-CoV-2 RNA is generally detectable in upper respiratory specimens during the acute phase of infection. The  lowest concentration of SARS-CoV-2 viral copies this assay can detect is 138 copies/mL. A negative result does not preclude SARS-Cov-2 infection and should not be used as the sole basis for treatment or other patient management decisions. A negative result may occur with  improper specimen collection/handling, submission of specimen other than nasopharyngeal swab, presence of viral mutation(s) within the areas targeted by this assay, and inadequate number of viral copies(<138 copies/mL). A negative result must be combined with clinical observations, patient history, and epidemiological information. The expected result is Negative.  Fact Sheet for Patients:  BloggerCourse.com  Fact Sheet for Healthcare Providers:  SeriousBroker.it  This test is no t yet approved or cleared by the [place] FDA and  has been authorized for detection and/or diagnosis of SARS-CoV-2 by FDA under an Emergency Use Authorization (EUA). This EUA will remain  in effect (meaning this test can be used) for the duration of the COVID-19 declaration under Section 564(b)(1) of the Act, 21 U.S.C.section 360bbb-3(b)(1), unless the authorization is terminated  or revoked sooner.       Influenza A by PCR NEGATIVE NEGATIVE Final   Influenza B by PCR NEGATIVE NEGATIVE Final    Comment: (NOTE) The Xpert Xpress SARS-CoV-2/FLU/RSV plus assay is intended as an aid in the diagnosis of influenza from Nasopharyngeal swab specimens and should not be used as a sole basis for treatment. Nasal washings and aspirates are unacceptable for Xpert Xpress SARS-CoV-2/FLU/RSV testing.  Fact Sheet for Patients: BloggerCourse.com  Fact Sheet for Healthcare Providers: SeriousBroker.it  This test is not yet approved or cleared by the [place] FDA and has been authorized for detection and/or diagnosis of SARS-CoV-2 by FDA under  an Emergency Use Authorization (EUA). This EUA will remain in effect (meaning this test can be used) for the duration of the COVID-19 declaration under Section 564(b)(1) of the Act, 21 U.S.C. section 360bbb-3(b)(1), unless the authorization is terminated or revoked.  Performed at [facility], [address], [place], [place] [address]       Radiology Studies: No results found.  Scheduled Meds:  atorvastatin  20 mg Oral Daily   carvedilol  6.25 mg Oral BID WC   dapagliflozin propanediol  10 mg Oral Daily   enoxaparin (LOVENOX) injection  0.5 mg/kg Subcutaneous Q24H   furosemide  80 mg Intravenous TID   insulin aspart  0-15 Units Subcutaneous TID WC   insulin aspart  0-5 Units Subcutaneous QHS   insulin aspart protamine- aspart  8 Units Subcutaneous BID WC   iron polysaccharides  150 mg Oral Daily   metolazone  5 mg Oral Daily  polyethylene glycol  17 g Oral Daily   potassium chloride  20 mEq Oral BID   sacubitril-valsartan  1 tablet Oral BID   spironolactone  25 mg Oral Daily   Continuous Infusions:   LOS: 7 days   Time spent: 30 minutes. More than 50% of the time was spent in counseling/coordination of care  Delfino Lovett, MD [facility]  If 7PM-7AM, please contact night-coverage [email or web address]  12/04/2020, 4:57 PM   This record has been created using Conservation officer, historic buildings. Errors have been sought and corrected,but may not always be located. Such creation errors do not reflect on the standard of care.
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
## 2020-12-05 ENCOUNTER — Ambulatory Visit: Payer: Self-pay | Admitting: Family

## 2020-12-05 LAB — CBC
HCT: 30.1 % — ABNORMAL LOW (ref 36.0–46.0)
Hemoglobin: 9.2 g/dL — ABNORMAL LOW (ref 12.0–15.0)
MCH: 25.8 pg — ABNORMAL LOW (ref 26.0–34.0)
MCHC: 30.6 g/dL (ref 30.0–36.0)
MCV: 84.3 fL (ref 80.0–100.0)
Platelets: 439 10*3/uL — ABNORMAL HIGH (ref 150–400)
RBC: 3.57 MIL/uL — ABNORMAL LOW (ref 3.87–5.11)
RDW: 18.1 % — ABNORMAL HIGH (ref 11.5–15.5)
WBC: 5.3 10*3/uL (ref 4.0–10.5)
nRBC: 0 % (ref 0.0–0.2)

## 2020-12-05 LAB — BASIC METABOLIC PANEL
Anion gap: 9 (ref 5–15)
BUN: 26 mg/dL — ABNORMAL HIGH (ref 6–20)
CO2: 38 mmol/L — ABNORMAL HIGH (ref 22–32)
Calcium: 8 mg/dL — ABNORMAL LOW (ref 8.9–10.3)
Chloride: 89 mmol/L — ABNORMAL LOW (ref 98–111)
Creatinine, Ser: 1.16 mg/dL — ABNORMAL HIGH (ref 0.44–1.00)
GFR, Estimated: >60 mL/min (ref >60)
Glucose, Bld: 195 mg/dL — ABNORMAL HIGH (ref 70–99)
Potassium: 3.5 mmol/L (ref 3.5–5.1)
Sodium: 136 mmol/L (ref 135–145)

## 2020-12-05 LAB — GLUCOSE, CAPILLARY
Glucose-Capillary: 167 mg/dL — ABNORMAL HIGH (ref 70–99)
Glucose-Capillary: 176 mg/dL — ABNORMAL HIGH (ref 70–99)
Glucose-Capillary: 277 mg/dL — ABNORMAL HIGH (ref 70–99)
Glucose-Capillary: 270 mg/dL — ABNORMAL HIGH (ref 70–99)

## 2020-12-05 NOTE — Progress Notes (Signed)
PROGRESS NOTE    [name]  YME:[id number] DOB: Mar 14, 1986 DOA: 11/27/2020 PCP: Patient, No Pcp Per (Inactive)   Brief Narrative: Taken from H&P. Molly Howard is a 35 y.o. female with medical history significant of nonischemic cardiomyopathy and CHF with EF 30 to 35%, hypertension, hyperlipidemia, diabetes mellitus, GERD, anemia, former smoker, CKD-2, polysubstance abuse, who presents with shortness breath.   Patient states that her shortness breath has been going on for more than 1 week, which has been progressively worsening.  Patient also has dry cough, worsening bilateral leg edema, generalized weakness, approximately 40 pounds of weight gain in the past 2 weeks.  She has orthopnea.  Her shortness breath is worse when laying flat.  Denies chest pain, fever or chills.  Found to have BNP of 1043, COVID PCR negative, mild AKI and chest x-ray positive for cardiomegaly and vascular congestion.  Patient admitted for acute on chronic HFrEF with volume overload and started on IV diuresis.  Cardiology is on board.  Subjective: Sitting in the chair.  Still has significant lower extremity swelling and dyspnea on exertion. Assessment & Plan:   Principal Problem:   Acute on chronic combined systolic and diastolic CHF (congestive heart failure) (HCC) Active Problems:   Essential hypertension   HLD (hyperlipidemia)   Type II diabetes mellitus with renal manifestations (HCC)   Chronic kidney disease (CKD), stage II (mild)   Iron deficiency anemia  Acute on chronic combined systolic and diastolic CHF (congestive heart failure) (HCC): 2D echo on 10/11/2020 showed EF of 30-35% with grade 2 diastolic dysfunction.  Patient has orthopnea, 40 pounds of weight gain, elevated BNP 1043, vascular congestion on chest x-ray, clinically consistent with CHF exacerbation. Recent admissions due to similar reasons, saw cardiology on 11/13/2020 and was advised to take her medications. Apparently she has not picked up  her medications from pharmacy yet. -Continue Lasix to 80 mg 3 times a day, spironolactone 25 mg once daily and metolazone 5 mg p.o. daily. Net IO Since Admission: -22,060 mL [12/05/20 1613]  -Strict intake and output.  Patient still has significant fluid overload and will need ongoing diuresis while here -Daily weight and BMP  Hypokalemia Repleted and resolved  Essential hypertension.  Patient was not taking her blood pressure medications either. Blood pressure currently within goal. -Continue with Coreg, spironolactone, Entresto, metolazone along with Lasix. -Holding Cozaar due to AKI-can be resumed if needed as renal function improved.  HLD (hyperlipidemia):  -Continue Lipitor   Type II diabetes mellitus with renal manifestations [facility]): Recent A1c 11.3, poorly controlled.   -70/30 insulin 8 mg twice daily -Sliding scale insulin and Farxiga.  Added insulin 70/30 8 units subcu twice daily for better blood sugar control   Chronic kidney disease (CKD), stage II (mild): Baseline creatinine is~1.0.  On admission it was 1.25, continue to improve, at 1. 16 today  Iron deficiency anemia: Stable hemoglobin,  -Continue iron  Polysubstance abuse and medication noncompliance UDS positive for cocaine and opiate.  Counseled. Patient will need medication assistance on discharge. -TOC aware  Stage III obesity. Estimated body mass index is 36.81 kg/m as calculated from the following:   Height as of this encounter: 5\' 7"  (1.702 m).   Weight as of this encounter: 106.6 kg.  This complicates overall prognosis..  Objective: Vitals:   12/05/20 0500 12/05/20 0822 12/05/20 1214 12/05/20 1551  BP:  108/75 109/79 123/86  Pulse:  94 91 91  Resp:  20 20 18   Temp:  98.6 F (37  C) 98.1 F (36.7 C)   TempSrc:      SpO2:  92% 99% 97%  Weight: 106.6 kg     Height:        Intake/Output Summary (Last 24 hours) at 12/05/2020 1613 Last data filed at 12/05/2020 0950 Gross per 24 hour  Intake 1320 ml   Output 2400 ml  Net -1080 ml   Filed Weights   12/03/20 0825 12/04/20 0042 12/05/20 0500  Weight: 115.8 kg 107.6 kg 106.6 kg    Examination:  General.  Well-developed, obese lady, in no acute distress. Pulmonary.  Lungs clear bilaterally, normal respiratory effort. CV.  Regular rate and rhythm, no JVD, rub or murmur. Abdomen.  Soft, nontender, nondistended, BS positive. CNS.  Alert and oriented x3.  No focal neurologic deficit. Extremities.  1+ LE edema, no cyanosis, pulses intact and symmetrical. Psychiatry.  Judgment and insight appears normal.   DVT prophylaxis: Lovenox Code Status: Full Family Communication: Discussed with patient Disposition Plan:  Status is: Inpatient  Remains inpatient appropriate because:Inpatient level of care appropriate due to severity of illness, needing aggressive diuresis  Dispo: The patient is from: Home              Anticipated d/c is to: Home in next 2 to 3 days              Patient currently is not medically stable to d/c.   Difficult to place patient No              Level of care: Progressive Cardiac  All the records are reviewed and case discussed with Care Management/Social Worker. Management plans discussed with the patient, nursing and they are in agreement.  Consultants:  Cardiology  Procedures:  Antimicrobials: None  Data Reviewed: I have personally reviewed following labs and imaging studies  CBC: Recent Labs  Lab 12/01/20 0658 12/04/20 0559 12/05/20 0549  WBC 5.8 5.0 5.3  HGB 9.2* 9.2* 9.2*  HCT 30.8* 30.1* 30.1*  MCV 83.5 84.1 84.3  PLT 479* 463* 439*   Basic Metabolic Panel: Recent Labs  Lab 11/30/20 0739 12/01/20 0658 12/02/20 0741 12/03/20 0342 12/05/20 0549  NA 137 136 137 136 136  K 4.2 3.6 3.9 3.5 3.5  CL 93* 93* 94* 91* 89*  CO2 32 31 31 36* 38*  GLUCOSE 99 146* 131* 99 195*  BUN 30* 30* 27* 26* 26*  CREATININE 1.05* 0.96 1.11* 1.09* 1.16*  CALCIUM 8.0* 7.7* 7.8* 7.9* 8.0*   GFR: Estimated  Creatinine Clearance: 84.3 mL/min (A) (by C-G formula based on SCr of 1.16 mg/dL (H)). Liver Function Tests: No results for input(s): AST, ALT, ALKPHOS, BILITOT, PROT, ALBUMIN in the last 168 hours. No results for input(s): LIPASE, AMYLASE in the last 168 hours. No results for input(s): AMMONIA in the last 168 hours. Coagulation Profile: No results for input(s): INR, PROTIME in the last 168 hours. Cardiac Enzymes: No results for input(s): CKTOTAL, CKMB, CKMBINDEX, TROPONINI in the last 168 hours. BNP (last 3 results) No results for input(s): PROBNP in the last 8760 hours. HbA1C: No results for input(s): HGBA1C in the last 72 hours. CBG: Recent Labs  Lab 12/04/20 1642 12/04/20 1947 12/05/20 0820 12/05/20 1212 12/05/20 1549  GLUCAP 240* 200* 167* 176* 277*   Lipid Profile: No results for input(s): CHOL, HDL, LDLCALC, TRIG, CHOLHDL, LDLDIRECT in the last 72 hours. Thyroid Function Tests: No results for input(s): TSH, T4TOTAL, FREET4, T3FREE, THYROIDAB in the last 72 hours. Anemia Panel: No results for  input(s): VITAMINB12, FOLATE, FERRITIN, TIBC, IRON, RETICCTPCT in the last 72 hours. Sepsis Labs: No results for input(s): PROCALCITON, LATICACIDVEN in the last 168 hours.  Recent Results (from the past 240 hour(s))  Resp Panel by RT-PCR (Flu A&B, Covid) Nasopharyngeal Swab     Status: None   Collection Time: 11/27/20  8:02 AM   Specimen: Nasopharyngeal Swab; Nasopharyngeal(NP) swabs in vial transport medium  Result Value Ref Range Status   SARS Coronavirus 2 by RT PCR NEGATIVE NEGATIVE Final    Comment: (NOTE) SARS-CoV-2 target nucleic acids are NOT DETECTED.  The SARS-CoV-2 RNA is generally detectable in upper respiratory specimens during the acute phase of infection. The lowest concentration of SARS-CoV-2 viral copies this assay can detect is 138 copies/mL. A negative result does not preclude SARS-Cov-2 infection and should not be used as the sole basis for treatment  or other patient management decisions. A negative result may occur with  improper specimen collection/handling, submission of specimen other than nasopharyngeal swab, presence of viral mutation(s) within the areas targeted by this assay, and inadequate number of viral copies(<138 copies/mL). A negative result must be combined with clinical observations, patient history, and epidemiological information. The expected result is Negative.  Fact Sheet for Patients:  BloggerCourse.com  Fact Sheet for Healthcare Providers:  SeriousBroker.it  This test is no t yet approved or cleared by the [place] FDA and  has been authorized for detection and/or diagnosis of SARS-CoV-2 by FDA under an Emergency Use Authorization (EUA). This EUA will remain  in effect (meaning this test can be used) for the duration of the COVID-19 declaration under Section 564(b)(1) of the Act, 21 U.S.C.section 360bbb-3(b)(1), unless the authorization is terminated  or revoked sooner.       Influenza A by PCR NEGATIVE NEGATIVE Final   Influenza B by PCR NEGATIVE NEGATIVE Final    Comment: (NOTE) The Xpert Xpress SARS-CoV-2/FLU/RSV plus assay is intended as an aid in the diagnosis of influenza from Nasopharyngeal swab specimens and should not be used as a sole basis for treatment. Nasal washings and aspirates are unacceptable for Xpert Xpress SARS-CoV-2/FLU/RSV testing.  Fact Sheet for Patients: BloggerCourse.com  Fact Sheet for Healthcare Providers: SeriousBroker.it  This test is not yet approved or cleared by the [place] FDA and has been authorized for detection and/or diagnosis of SARS-CoV-2 by FDA under an Emergency Use Authorization (EUA). This EUA will remain in effect (meaning this test can be used) for the duration of the COVID-19 declaration under Section 564(b)(1) of the Act, 21 U.S.C. section  360bbb-3(b)(1), unless the authorization is terminated or revoked.  Performed at [facility], [address], [place], [place] [address]       Radiology Studies: No results found.  Scheduled Meds:  atorvastatin  20 mg Oral Daily   carvedilol  6.25 mg Oral BID WC   dapagliflozin propanediol  10 mg Oral Daily   enoxaparin (LOVENOX) injection  0.5 mg/kg Subcutaneous Q24H   furosemide  80 mg Intravenous TID   insulin aspart  0-15 Units Subcutaneous TID WC   insulin aspart  0-5 Units Subcutaneous QHS   insulin aspart protamine- aspart  8 Units Subcutaneous BID WC   iron polysaccharides  150 mg Oral Daily   metolazone  5 mg Oral Daily   polyethylene glycol  17 g Oral Daily   potassium chloride  20 mEq Oral BID   sacubitril-valsartan  1 tablet Oral BID   spironolactone  25 mg Oral Daily   Continuous  Infusions:   LOS: 8 days   Time spent: 30 minutes. More than 50% of the time was spent in counseling/coordination of care  Delfino Lovett, MD [facility]  If 7PM-7AM, please contact night-coverage [email or web address]  12/05/2020, 4:13 PM   This record has been created using Conservation officer, historic buildings. Errors have been sought and corrected,but may not always be located. Such creation errors do not reflect on the standard of care.

## 2020-12-05 NOTE — Progress Notes (Signed)
Progress Note  Patient Name: Molly Howard Date of Encounter: 12/05/2020  Primary Cardiologist: Agbor-Etang  Subjective   No chest pain or palpitations. Dyspnea and swelling improving. Documented UOP 2.8 L for the past 24 hours, net - 22.3 L for the admission. Weight 115.8-->107.6-->106.6 kg. Renal function stable.   Inpatient Medications    Scheduled Meds:  atorvastatin  20 mg Oral Daily   carvedilol  6.25 mg Oral BID WC   dapagliflozin propanediol  10 mg Oral Daily   enoxaparin (LOVENOX) injection  0.5 mg/kg Subcutaneous Q24H   furosemide  80 mg Intravenous TID   insulin aspart  0-15 Units Subcutaneous TID WC   insulin aspart  0-5 Units Subcutaneous QHS   insulin aspart protamine- aspart  8 Units Subcutaneous BID WC   iron polysaccharides  150 mg Oral Daily   metolazone  5 mg Oral Daily   polyethylene glycol  17 g Oral Daily   potassium chloride  20 mEq Oral BID   sacubitril-valsartan  1 tablet Oral BID   spironolactone  25 mg Oral Daily   Continuous Infusions:  PRN Meds: acetaminophen, albuterol, chlorpheniramine-HYDROcodone, dextromethorphan-guaiFENesin, hydrALAZINE, ondansetron (ZOFRAN) IV   Vital Signs    Vitals:   12/04/20 1449 12/04/20 1911 12/05/20 0400 12/05/20 0500  BP: 112/86 115/82 118/82   Pulse: 90 90 84   Resp: 18 18 16    Temp: 98.1 F (36.7 C) 98.5 F (36.9 C) 98.3 F (36.8 C)   TempSrc:  Oral Oral   SpO2: 98% 100%    Weight:    106.6 kg  Height:        Intake/Output Summary (Last 24 hours) at 12/05/2020 0714 Last data filed at 12/04/2020 2341 Gross per 24 hour  Intake 1800 ml  Output 4600 ml  Net -2800 ml    Filed Weights   12/03/20 0825 12/04/20 0042 12/05/20 0500  Weight: 115.8 kg 107.6 kg 106.6 kg    Telemetry    SR - Personally Reviewed  ECG    No new tracings - Personally Reviewed  Physical Exam   GEN: No acute distress.   Neck: No JVD. Cardiac: RRR, no murmurs, rubs, or gallops.  Respiratory: Mildly diminished breath  sounds bilaterally.  GI: Firm, nontender, mildly distended.   MS: 1+ bilateral woody edema; No deformity. Neuro:  Alert and oriented x 3; Nonfocal.  Psych: Normal affect.  Labs    Chemistry Recent Labs  Lab 12/02/20 0741 12/03/20 0342 12/05/20 0549  NA 137 136 136  K 3.9 3.5 3.5  CL 94* 91* 89*  CO2 31 36* 38*  GLUCOSE 131* 99 195*  BUN 27* 26* 26*  CREATININE 1.11* 1.09* 1.16*  CALCIUM 7.8* 7.9* 8.0*  GFRNONAA >60 >60 >60  ANIONGAP 12 9 9       Hematology Recent Labs  Lab 12/01/20 0658 12/04/20 0559 12/05/20 0549  WBC 5.8 5.0 5.3  RBC 3.69* 3.58* 3.57*  HGB 9.2* 9.2* 9.2*  HCT 30.8* 30.1* 30.1*  MCV 83.5 84.1 84.3  MCH 24.9* 25.7* 25.8*  MCHC 29.9* 30.6 30.6  RDW 17.9* 18.0* 18.1*  PLT 479* 463* 439*     Cardiac EnzymesNo results for input(s): TROPONINI in the last 168 hours. No results for input(s): TROPIPOC in the last 168 hours.   BNPNo results for input(s): BNP, PROBNP in the last 168 hours.   DDimer No results for input(s): DDIMER in the last 168 hours.   Radiology    No results found.  Cardiac Studies  2D echo 10/2020: 1. Left ventricular ejection fraction, by estimation, is 30 to 35%. The  left ventricle has moderate to severely decreased function. The left  ventricle demonstrates global hypokinesis. Left ventricular diastolic  parameters are consistent with Grade II  diastolic dysfunction (pseudonormalization).   2. Right ventricular systolic function is mildly reduced. The right  ventricular size is normal.   3. A small pericardial effusion is present.   4. The mitral valve is normal in structure. No evidence of mitral valve  regurgitation.   5. Tricuspid valve regurgitation is moderate.   6. The aortic valve is tricuspid. Aortic valve regurgitation is not  visualized.  __________  2D echo 05/2020: 1. Left ventricular ejection fraction, by estimation, is 20 to 25%. The  left ventricle has severely decreased function. The left  ventricle  demonstrates global hypokinesis. The left ventricular internal cavity size  was mildly dilated. Indeterminate  diastolic filling due to E-A fusion.   2. Right ventricular systolic function is normal. The right ventricular  size is mildly enlarged. There is severely elevated pulmonary artery  systolic pressure.   3. The mitral valve is normal in structure. Trivial mitral valve  regurgitation. No evidence of mitral stenosis.   4. Tricuspid valve regurgitation is moderate.   5. The aortic valve was not well visualized. Aortic valve regurgitation  is not visualized. No aortic stenosis is present.   6. The inferior vena cava is dilated in size with <50% respiratory  variability, suggesting right atrial pressure of 15 mmHg. __________  Eugenie Birks MPI 10/2019: There was no ST segment deviation noted during stress. T wave inversion was noted during stress in the I and aVL leads. Defect 1: There is a small defect of mild severity present in the apex location. This is likely due to breast attenuation artifact Nuclear stress EF: 30%. This is a high risk study due to low EF. Low EF seems to be due to nonischemic cardiomyopathy. __________  2D echo 10/2019: 1. Left ventricular ejection fraction, by estimation, is 35 to 40%. The  left ventricle has moderately decreased function. The left ventricle  demonstrates global hypokinesis. Left ventricular diastolic parameters are  consistent with Grade II diastolic  dysfunction (pseudonormalization).   2. Right ventricular systolic function is normal. The right ventricular  size is normal.   3. The mitral valve is normal in structure. No evidence of mitral valve  regurgitation. No evidence of mitral stenosis.   4. The aortic valve is normal in structure. Aortic valve regurgitation is  not visualized. No aortic stenosis is present.   5. The inferior vena cava is normal in size with greater than 50%  respiratory variability, suggesting right  atrial pressure of 3 mmHg.   Patient Profile     35 y.o. 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  Assessment & Plan    1.  Acute on chronic combined systolic and diastolic CHF secondary to NICM: -Volume status is improving, remains volume up -Readmission likely in the setting of medication nonadherence and ongoing polysubstance abuse -Continue IV Lasix 80 mg 3 times daily and metolazone 5 mg daily with KCl repletion as indicated -She will also continue carvedilol, Entresto, spironolactone and Farxiga -Daily weights -Strict I's and O's  2.  Polysubstance  abuse: -She has denied cocaine use since 10/2020, however urine drug screen was positive for cocaine and opiates upon this admission -Complete cessation is recommended  3.  Noncompliance: -She indicates she cannot afford medications, does not work, and does not have health insurance -She will need TOC assistance in obtaining co-pay cards and medication assistance program paperwork prior to discharge  4.  CKD stage II: -Stable -Monitor with diuresis    For questions or updates, please contact [facility] Please consult [email or web address] for contact info under Cardiology/STEMI.    Signed, Eula Listen, PA-C [facility] Pager: [phone] 12/05/2020, 7:14 AM

## 2020-12-05 NOTE — Progress Notes (Signed)
   12/05/20 1040  Clinical Encounter Type  Visited With Patient  Visit Type Initial;Spiritual support;Social support  Spiritual Encounters  Spiritual Needs Emotional  Chaplain Burris met with Molly Howard who was sitting up and in very good spirits. Chaplain inquired about her feelings and what her needs might be. Pt expressed feeling better physically and hopeful for discharge; looking forward to returning home and also pursuing things "on my bucket list." Chaplain Burris invited Pt to name some of those as well as other sources of support and comfort (mother, sister, and her pitbulls). Molly Howard seems positive and hopeful.

## 2020-12-06 LAB — BASIC METABOLIC PANEL
Anion gap: 14 (ref 5–15)
BUN: 26 mg/dL — ABNORMAL HIGH (ref 6–20)
CO2: 36 mmol/L — ABNORMAL HIGH (ref 22–32)
Calcium: 8 mg/dL — ABNORMAL LOW (ref 8.9–10.3)
Chloride: 87 mmol/L — ABNORMAL LOW (ref 98–111)
Creatinine, Ser: 1.18 mg/dL — ABNORMAL HIGH (ref 0.44–1.00)
GFR, Estimated: >60 mL/min (ref >60)
Glucose, Bld: 159 mg/dL — ABNORMAL HIGH (ref 70–99)
Potassium: 3.6 mmol/L (ref 3.5–5.1)
Sodium: 137 mmol/L (ref 135–145)

## 2020-12-06 LAB — GLUCOSE, CAPILLARY
Glucose-Capillary: 160 mg/dL — ABNORMAL HIGH (ref 70–99)
Glucose-Capillary: 252 mg/dL — ABNORMAL HIGH (ref 70–99)
Glucose-Capillary: 261 mg/dL — ABNORMAL HIGH (ref 70–99)
Glucose-Capillary: 210 mg/dL — ABNORMAL HIGH (ref 70–99)

## 2020-12-06 NOTE — Progress Notes (Signed)
Progress Note  Patient Name: Molly Howard Date of Encounter: 12/06/2020  Primary Cardiologist: Agbor-Etang  Subjective   No chest pain or palpitations. Dyspnea and swelling improving. Documented UOP 1.2 L for the past 24 hours, net - 23.5 L for the admission. Weight 115.8-->107.6-->106.6 kg. No weight this morning. Labs pending.    Inpatient Medications    Scheduled Meds:  atorvastatin  20 mg Oral Daily   carvedilol  6.25 mg Oral BID WC   dapagliflozin propanediol  10 mg Oral Daily   enoxaparin (LOVENOX) injection  0.5 mg/kg Subcutaneous Q24H   furosemide  80 mg Intravenous TID   insulin aspart  0-15 Units Subcutaneous TID WC   insulin aspart  0-5 Units Subcutaneous QHS   insulin aspart protamine- aspart  8 Units Subcutaneous BID WC   iron polysaccharides  150 mg Oral Daily   metolazone  5 mg Oral Daily   polyethylene glycol  17 g Oral Daily   potassium chloride  20 mEq Oral BID   sacubitril-valsartan  1 tablet Oral BID   spironolactone  25 mg Oral Daily   Continuous Infusions:  PRN Meds: acetaminophen, albuterol, chlorpheniramine-HYDROcodone, dextromethorphan-guaiFENesin, hydrALAZINE, ondansetron (ZOFRAN) IV   Vital Signs    Vitals:   12/05/20 1214 12/05/20 1551 12/05/20 2024 12/06/20 0510  BP: 109/79 123/86 124/88 103/69  Pulse: 91 91 88 91  Resp: 20 18 18 15   Temp: 98.1 F (36.7 C)  97.7 F (36.5 C) 98.4 F (36.9 C)  TempSrc:    Oral  SpO2: 99% 97% 99% 94%  Weight:      Height:        Intake/Output Summary (Last 24 hours) at 12/06/2020 0751 Last data filed at 12/05/2020 2300 Gross per 24 hour  Intake 960 ml  Output 2200 ml  Net -1240 ml    Filed Weights   12/03/20 0825 12/04/20 0042 12/05/20 0500  Weight: 115.8 kg 107.6 kg 106.6 kg    Telemetry    SR - Personally Reviewed  ECG    No new tracings - Personally Reviewed  Physical Exam   GEN: No acute distress.   Neck: No JVD. Cardiac: RRR, no murmurs, rubs, or gallops.  Respiratory:  Improving, mildly diminished breath sounds bilaterally.  GI: Firm, nontender, mildly distended.   MS: 1+ bilateral woody edema; No deformity. Neuro:  Alert and oriented x 3; Nonfocal.  Psych: Normal affect.  Labs    Chemistry Recent Labs  Lab 12/02/20 0741 12/03/20 0342 12/05/20 0549  NA 137 136 136  K 3.9 3.5 3.5  CL 94* 91* 89*  CO2 31 36* 38*  GLUCOSE 131* 99 195*  BUN 27* 26* 26*  CREATININE 1.11* 1.09* 1.16*  CALCIUM 7.8* 7.9* 8.0*  GFRNONAA >60 >60 >60  ANIONGAP 12 9 9       Hematology Recent Labs  Lab 12/01/20 0658 12/04/20 0559 12/05/20 0549  WBC 5.8 5.0 5.3  RBC 3.69* 3.58* 3.57*  HGB 9.2* 9.2* 9.2*  HCT 30.8* 30.1* 30.1*  MCV 83.5 84.1 84.3  MCH 24.9* 25.7* 25.8*  MCHC 29.9* 30.6 30.6  RDW 17.9* 18.0* 18.1*  PLT 479* 463* 439*     Cardiac EnzymesNo results for input(s): TROPONINI in the last 168 hours. No results for input(s): TROPIPOC in the last 168 hours.   BNPNo results for input(s): BNP, PROBNP in the last 168 hours.   DDimer No results for input(s): DDIMER in the last 168 hours.   Radiology    No results  found.  Cardiac Studies   2D echo 10/2020: 1. Left ventricular ejection fraction, by estimation, is 30 to 35%. The  left ventricle has moderate to severely decreased function. The left  ventricle demonstrates global hypokinesis. Left ventricular diastolic  parameters are consistent with Grade II  diastolic dysfunction (pseudonormalization).   2. Right ventricular systolic function is mildly reduced. The right  ventricular size is normal.   3. A small pericardial effusion is present.   4. The mitral valve is normal in structure. No evidence of mitral valve  regurgitation.   5. Tricuspid valve regurgitation is moderate.   6. The aortic valve is tricuspid. Aortic valve regurgitation is not  visualized.  __________  2D echo 05/2020: 1. Left ventricular ejection fraction, by estimation, is 20 to 25%. The  left ventricle has severely  decreased function. The left ventricle  demonstrates global hypokinesis. The left ventricular internal cavity size  was mildly dilated. Indeterminate  diastolic filling due to E-A fusion.   2. Right ventricular systolic function is normal. The right ventricular  size is mildly enlarged. There is severely elevated pulmonary artery  systolic pressure.   3. The mitral valve is normal in structure. Trivial mitral valve  regurgitation. No evidence of mitral stenosis.   4. Tricuspid valve regurgitation is moderate.   5. The aortic valve was not well visualized. Aortic valve regurgitation  is not visualized. No aortic stenosis is present.   6. The inferior vena cava is dilated in size with <50% respiratory  variability, suggesting right atrial pressure of 15 mmHg. __________  Eugenie Birks MPI 10/2019: There was no ST segment deviation noted during stress. T wave inversion was noted during stress in the I and aVL leads. Defect 1: There is a small defect of mild severity present in the apex location. This is likely due to breast attenuation artifact Nuclear stress EF: 30%. This is a high risk study due to low EF. Low EF seems to be due to nonischemic cardiomyopathy. __________  2D echo 10/2019: 1. Left ventricular ejection fraction, by estimation, is 35 to 40%. The  left ventricle has moderately decreased function. The left ventricle  demonstrates global hypokinesis. Left ventricular diastolic parameters are  consistent with Grade II diastolic  dysfunction (pseudonormalization).   2. Right ventricular systolic function is normal. The right ventricular  size is normal.   3. The mitral valve is normal in structure. No evidence of mitral valve  regurgitation. No evidence of mitral stenosis.   4. The aortic valve is normal in structure. Aortic valve regurgitation is  not visualized. No aortic stenosis is present.   5. The inferior vena cava is normal in size with greater than 50%  respiratory  variability, suggesting right atrial pressure of 3 mmHg.   Patient Profile     35 y.o. 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  Assessment & Plan    1.  Acute on chronic combined systolic and diastolic CHF secondary to NICM: -Volume status is improving, remains volume up -She will likely need another day or two of IV diuresis  -Readmission likely in the setting of medication nonadherence and ongoing polysubstance abuse -Continue IV Lasix 80 mg 3 times daily and metolazone 5 mg daily with KCl repletion as indicated -Check  BMP -She will also continue carvedilol, Entresto, spironolactone and Farxiga -Daily weights -Strict I's and O's  2.  Polysubstance abuse: -She has denied cocaine use since 10/2020, however urine drug screen was positive for cocaine and opiates upon this admission -Complete cessation is recommended  3.  Noncompliance: -She indicates she cannot afford medications, does not work, and does not have health insurance -She will need TOC assistance in obtaining co-pay cards and medication assistance program paperwork prior to discharge  4.  CKD stage II: -Stable on last check, update -Monitor with diuresis    For questions or updates, please contact [facility] Please consult [email or web address] for contact info under Cardiology/STEMI.    Signed, Eula Listen, PA-C [facility] Pager: [phone] 12/06/2020, 7:51 AM

## 2020-12-06 NOTE — Progress Notes (Signed)
PROGRESS NOTE    Molly Howard  YFV:[id number] DOB: 03-09-1986 DOA: 11/27/2020 PCP: Patient, No Pcp Per (Inactive)   Brief Narrative: Taken from H&P. Molly Howard is a 35 y.o. female with medical history significant of nonischemic cardiomyopathy and CHF with EF 30 to 35%, hypertension, hyperlipidemia, diabetes mellitus, GERD, anemia, former smoker, CKD-2, polysubstance abuse, who presents with shortness breath.   Patient states that her shortness breath has been going on for more than 1 week, which has been progressively worsening.  Patient also has dry cough, worsening bilateral leg edema, generalized weakness, approximately 40 pounds of weight gain in the past 2 weeks.  She has orthopnea.  Her shortness breath is worse when laying flat.  Denies chest pain, fever or chills.  Found to have BNP of 1043, COVID PCR negative, mild AKI and chest x-ray positive for cardiomegaly and vascular congestion.  Patient admitted for acute on chronic HFrEF with volume overload and started on IV diuresis.  Cardiology is on board.  Subjective: Slowly improving.  No new complaints.  Diuresing well  Assessment & Plan:   Principal Problem:   Acute on chronic combined systolic and diastolic CHF (congestive heart failure) (HCC) Active Problems:   Essential hypertension   HLD (hyperlipidemia)   Type II diabetes mellitus with renal manifestations (HCC)   Chronic kidney disease (CKD), stage II (mild)   Iron deficiency anemia  Acute on chronic combined systolic and diastolic CHF (congestive heart failure) (HCC): 2D echo on 10/11/2020 showed EF of 30-35% with grade 2 diastolic dysfunction.  Patient has orthopnea, 40 pounds of weight gain, elevated BNP 1043, vascular congestion on chest x-ray, clinically consistent with CHF exacerbation. -Continue Lasix to 80 mg 3 times a day, spironolactone 25 mg once daily and metolazone 5 mg p.o. daily. Net IO Since Admission: -25,380 mL [12/06/20 1138]  -Strict intake and  output.  Patient still has significant fluid overload and will need ongoing diuresis while here -Daily weight and BMP  Hypokalemia Repleted and resolved  Essential hypertension.  Patient was not taking her blood pressure medications either. Blood pressure currently within goal. -Continue with Coreg, spironolactone, Entresto, metolazone along with Lasix.  HLD (hyperlipidemia):  -Continue Lipitor   Type II diabetes mellitus with renal manifestations [facility]): Recent A1c 11.3, poorly controlled.   -70/30 insulin 8 mg twice daily -Sliding scale insulin and Farxiga.  Added insulin 70/30 8 units subcu twice daily for better blood sugar control   Chronic kidney disease (CKD), stage II (mild): Baseline creatinine is~1.0.  Close to her baseline now  Iron deficiency anemia: Stable hemoglobin,  -Continue iron  Polysubstance abuse and medication noncompliance UDS positive for cocaine and opiate.  Counseled. Patient will need medication assistance on discharge. -TOC aware  Stage III obesity. Estimated body mass index is 36.87 kg/m as calculated from the following:   Height as of this encounter: 5\' 7"  (1.702 m).   Weight as of this encounter: 106.8 kg.  This complicates overall prognosis..  Objective: Vitals:   12/05/20 2024 12/06/20 0510 12/06/20 0804 12/06/20 0817  BP: 124/88 103/69  115/83  Pulse: 88 91  91  Resp: 18 15  16   Temp: 97.7 F (36.5 C) 98.4 F (36.9 C)  98.8 F (37.1 C)  TempSrc:  Oral    SpO2: 99% 94%  97%  Weight:   106.8 kg   Height:        Intake/Output Summary (Last 24 hours) at 12/06/2020 1138 Last data filed at 12/06/2020 1022 Gross per  24 hour  Intake 1080 ml  Output 4400 ml  Net -3320 ml   Filed Weights   12/04/20 0042 12/05/20 0500 12/06/20 0804  Weight: 107.6 kg 106.6 kg 106.8 kg    Examination:  General.  Well-developed, obese lady, in no acute distress. Pulmonary.  Lungs clear bilaterally, normal respiratory effort. CV.  Regular rate and rhythm,  no JVD, rub or murmur. Abdomen.  Soft, nontender, nondistended, BS positive. CNS.  Alert and oriented x3.  No focal neurologic deficit. Extremities.  1+ LE edema, no cyanosis, pulses intact and symmetrical. Psychiatry.  Judgment and insight appears normal.   DVT prophylaxis: Lovenox Code Status: Full Family Communication: Discussed with patient Disposition Plan:  Status is: Inpatient  Remains inpatient appropriate because:Inpatient level of care appropriate due to severity of illness, needing aggressive diuresis  Dispo: The patient is from: Home              Anticipated d/c is to: Home in next 2 days/likely Monday              Patient currently is not medically stable to d/c.   Difficult to place patient No              Level of care: Progressive Cardiac  All the records are reviewed and case discussed with Care Management/Social Worker. Management plans discussed with the patient, nursing and they are in agreement.  Consultants:  Cardiology  Procedures:  Antimicrobials: None  Data Reviewed: I have personally reviewed following labs and imaging studies  CBC: Recent Labs  Lab 12/01/20 0658 12/04/20 0559 12/05/20 0549  WBC 5.8 5.0 5.3  HGB 9.2* 9.2* 9.2*  HCT 30.8* 30.1* 30.1*  MCV 83.5 84.1 84.3  PLT 479* 463* 439*   Basic Metabolic Panel: Recent Labs  Lab 12/01/20 0658 12/02/20 0741 12/03/20 0342 12/05/20 0549 12/06/20 0811  NA 136 137 136 136 137  K 3.6 3.9 3.5 3.5 3.6  CL 93* 94* 91* 89* 87*  CO2 31 31 36* 38* 36*  GLUCOSE 146* 131* 99 195* 159*  BUN 30* 27* 26* 26* 26*  CREATININE 0.96 1.11* 1.09* 1.16* 1.18*  CALCIUM 7.7* 7.8* 7.9* 8.0* 8.0*   GFR: Estimated Creatinine Clearance: 82.9 mL/min (A) (by C-G formula based on SCr of 1.18 mg/dL (H)). Liver Function Tests: No results for input(s): AST, ALT, ALKPHOS, BILITOT, PROT, ALBUMIN in the last 168 hours. No results for input(s): LIPASE, AMYLASE in the last 168 hours. No results for input(s): AMMONIA  in the last 168 hours. Coagulation Profile: No results for input(s): INR, PROTIME in the last 168 hours. Cardiac Enzymes: No results for input(s): CKTOTAL, CKMB, CKMBINDEX, TROPONINI in the last 168 hours. BNP (last 3 results) No results for input(s): PROBNP in the last 8760 hours. HbA1C: No results for input(s): HGBA1C in the last 72 hours. CBG: Recent Labs  Lab 12/05/20 0820 12/05/20 1212 12/05/20 1549 12/05/20 2025 12/06/20 0818  GLUCAP 167* 176* 277* 270* 160*   Lipid Profile: No results for input(s): CHOL, HDL, LDLCALC, TRIG, CHOLHDL, LDLDIRECT in the last 72 hours. Thyroid Function Tests: No results for input(s): TSH, T4TOTAL, FREET4, T3FREE, THYROIDAB in the last 72 hours. Anemia Panel: No results for input(s): VITAMINB12, FOLATE, FERRITIN, TIBC, IRON, RETICCTPCT in the last 72 hours. Sepsis Labs: No results for input(s): PROCALCITON, LATICACIDVEN in the last 168 hours.  Recent Results (from the past 240 hour(s))  Resp Panel by RT-PCR (Flu A&B, Covid) Nasopharyngeal Swab     Status: None  Collection Time: 11/27/20  8:02 AM   Specimen: Nasopharyngeal Swab; Nasopharyngeal(NP) swabs in vial transport medium  Result Value Ref Range Status   SARS Coronavirus 2 by RT PCR NEGATIVE NEGATIVE Final    Comment: (NOTE) SARS-CoV-2 target nucleic acids are NOT DETECTED.  The SARS-CoV-2 RNA is generally detectable in upper respiratory specimens during the acute phase of infection. The lowest concentration of SARS-CoV-2 viral copies this assay can detect is 138 copies/mL. A negative result does not preclude SARS-Cov-2 infection and should not be used as the sole basis for treatment or other patient management decisions. A negative result may occur with  improper specimen collection/handling, submission of specimen other than nasopharyngeal swab, presence of viral mutation(s) within the areas targeted by this assay, and inadequate number of viral copies(<138 copies/mL). A  negative result must be combined with clinical observations, patient history, and epidemiological information. The expected result is Negative.  Fact Sheet for Patients:  BloggerCourse.com  Fact Sheet for Healthcare Providers:  SeriousBroker.it  This test is no t yet approved or cleared by the [place] FDA and  has been authorized for detection and/or diagnosis of SARS-CoV-2 by FDA under an Emergency Use Authorization (EUA). This EUA will remain  in effect (meaning this test can be used) for the duration of the COVID-19 declaration under Section 564(b)(1) of the Act, 21 U.S.C.section 360bbb-3(b)(1), unless the authorization is terminated  or revoked sooner.       Influenza A by PCR NEGATIVE NEGATIVE Final   Influenza B by PCR NEGATIVE NEGATIVE Final    Comment: (NOTE) The Xpert Xpress SARS-CoV-2/FLU/RSV plus assay is intended as an aid in the diagnosis of influenza from Nasopharyngeal swab specimens and should not be used as a sole basis for treatment. Nasal washings and aspirates are unacceptable for Xpert Xpress SARS-CoV-2/FLU/RSV testing.  Fact Sheet for Patients: BloggerCourse.com  Fact Sheet for Healthcare Providers: SeriousBroker.it  This test is not yet approved or cleared by the [place] FDA and has been authorized for detection and/or diagnosis of SARS-CoV-2 by FDA under an Emergency Use Authorization (EUA). This EUA will remain in effect (meaning this test can be used) for the duration of the COVID-19 declaration under Section 564(b)(1) of the Act, 21 U.S.C. section 360bbb-3(b)(1), unless the authorization is terminated or revoked.  Performed at [facility], [address], [place], [place] [address]       Radiology Studies: No results found.  Scheduled Meds:  atorvastatin  20 mg Oral Daily   carvedilol  6.25 mg Oral BID WC    dapagliflozin propanediol  10 mg Oral Daily   enoxaparin (LOVENOX) injection  0.5 mg/kg Subcutaneous Q24H   furosemide  80 mg Intravenous TID   insulin aspart  0-15 Units Subcutaneous TID WC   insulin aspart  0-5 Units Subcutaneous QHS   insulin aspart protamine- aspart  8 Units Subcutaneous BID WC   iron polysaccharides  150 mg Oral Daily   metolazone  5 mg Oral Daily   polyethylene glycol  17 g Oral Daily   potassium chloride  20 mEq Oral BID   sacubitril-valsartan  1 tablet Oral BID   spironolactone  25 mg Oral Daily   Continuous Infusions:   LOS: 9 days   Time spent: 30 minutes. More than 50% of the time was spent in counseling/coordination of care  Delfino Lovett, MD [facility]  If 7PM-7AM, please contact night-coverage [email or web address]  12/06/2020, 11:38 AM   This record has been created using Dragon voice  recognition software. Errors have been sought and corrected,but may not always be located. Such creation errors do not reflect on the standard of care.

## 2020-12-07 LAB — CBC
HCT: 30.8 % — ABNORMAL LOW (ref 36.0–46.0)
Hemoglobin: 9.3 g/dL — ABNORMAL LOW (ref 12.0–15.0)
MCH: 25.7 pg — ABNORMAL LOW (ref 26.0–34.0)
MCHC: 30.2 g/dL (ref 30.0–36.0)
MCV: 85.1 fL (ref 80.0–100.0)
Platelets: 414 10*3/uL — ABNORMAL HIGH (ref 150–400)
RBC: 3.62 MIL/uL — ABNORMAL LOW (ref 3.87–5.11)
RDW: 18.3 % — ABNORMAL HIGH (ref 11.5–15.5)
WBC: 6.2 10*3/uL (ref 4.0–10.5)
nRBC: 0 % (ref 0.0–0.2)

## 2020-12-07 LAB — BASIC METABOLIC PANEL
Anion gap: 12 (ref 5–15)
BUN: 27 mg/dL — ABNORMAL HIGH (ref 6–20)
CO2: 37 mmol/L — ABNORMAL HIGH (ref 22–32)
Calcium: 8 mg/dL — ABNORMAL LOW (ref 8.9–10.3)
Chloride: 87 mmol/L — ABNORMAL LOW (ref 98–111)
Creatinine, Ser: 1.21 mg/dL — ABNORMAL HIGH (ref 0.44–1.00)
GFR, Estimated: 60 mL/min — ABNORMAL LOW (ref >60)
Glucose, Bld: 176 mg/dL — ABNORMAL HIGH (ref 70–99)
Potassium: 3.9 mmol/L (ref 3.5–5.1)
Sodium: 136 mmol/L (ref 135–145)

## 2020-12-07 LAB — GLUCOSE, CAPILLARY
Glucose-Capillary: 167 mg/dL — ABNORMAL HIGH (ref 70–99)
Glucose-Capillary: 232 mg/dL — ABNORMAL HIGH (ref 70–99)
Glucose-Capillary: 183 mg/dL — ABNORMAL HIGH (ref 70–99)
Glucose-Capillary: 258 mg/dL — ABNORMAL HIGH (ref 70–99)

## 2020-12-07 MED ORDER — FUROSEMIDE 10 MG/ML IJ SOLN
40.0000 mg | Freq: Three times a day (TID) | INTRAMUSCULAR | Status: DC
  Administered 2020-12-07 (×3): 40 mg via INTRAVENOUS
  Filled 2020-12-07 (×3): qty 4

## 2020-12-07 NOTE — TOC Initial Note (Signed)
Transition of Care [facility]) - Initial/Assessment Note    Patient Details  Name: Molly Howard MRN: [id number] Date of Birth: 06-18-1985  Transition of [facility]) CM/SW Contact:    Alberteen Sam, LCSW Phone Number: 12/07/2020, 3:26 PM  Clinical Narrative:                  CSW met with patient at bedside for consults for PCP and med assist. Patient reports she does not have a PCP she follows up with, provided [facility] packet to get started with a PCP. Patient agreeable for her medications to be sent to med management clinic at time of discharge for free of charge. Patient reports she lives with he mother and sister and identifies no other discharge needs at this time.     Expected Discharge Plan: Home/Self Care Barriers to Discharge: Continued Medical Work up   Patient Goals and CMS Choice Patient states their goals for this hospitalization and ongoing recovery are:: to go home CMS Medicare.gov Compare Post Acute Care list provided to:: Patient Choice offered to / list presented to : Patient  Expected Discharge Plan and Services Expected Discharge Plan: Home/Self Care       Living arrangements for the past 2 months: Single Family Home                                      Prior Living Arrangements/Services Living arrangements for the past 2 months: Single Family Home Lives with:: Self   Do you feel safe going back to the place where you live?: Yes               Activities of Daily Living Home Assistive Devices/Equipment: Cane (specify quad or straight) ADL Screening (condition at time of admission) Patient's cognitive ability adequate to safely complete daily activities?: Yes Is the patient deaf or have difficulty hearing?: No Does the patient have difficulty seeing, even when wearing glasses/contacts?: No Does the patient have difficulty concentrating, remembering, or making decisions?: No Patient able to express need for assistance with ADLs?: Yes Does  the patient have difficulty dressing or bathing?: Yes Independently performs ADLs?: No Communication: Independent Dressing (OT): Needs assistance Is this a change from baseline?: Pre-admission baseline Grooming: Needs assistance Is this a change from baseline?: Pre-admission baseline Feeding: Needs assistance Is this a change from baseline?: Pre-admission baseline Bathing: Needs assistance Is this a change from baseline?: Pre-admission baseline Toileting: Needs assistance Is this a change from baseline?: Pre-admission baseline In/Out Bed: Needs assistance Is this a change from baseline?: Pre-admission baseline Walks in Home: Needs assistance Is this a change from baseline?: Pre-admission baseline Does the patient have difficulty walking or climbing stairs?: Yes Weakness of Legs: Both Weakness of Arms/Hands: Both  Permission Sought/Granted                  Emotional Assessment Appearance:: Appears stated age Attitude/Demeanor/Rapport: Gracious Affect (typically observed): Calm Orientation: : Oriented to Self, Oriented to Place, Oriented to  Time, Oriented to Situation Alcohol / Substance Use: Not Applicable Psych Involvement: No (comment)  Admission diagnosis:  Acute on chronic systolic congestive heart failure (HCC) [I50.23] Acute on chronic combined systolic and diastolic CHF (congestive heart failure) (HCC) [I50.43] Patient Active Problem List   Diagnosis Date Noted   Acute on chronic combined systolic and diastolic CHF (congestive heart failure) ([place]) 11/27/2020   HLD (hyperlipidemia) 11/27/2020  Type II diabetes mellitus with renal manifestations ([place]) 11/27/2020   Chronic kidney disease (CKD), stage II (mild) 11/27/2020   Iron deficiency anemia 11/27/2020   Hypomagnesemia 10/17/2020   Anasarca 10/11/2020   Hyperglycemia due to type 2 diabetes mellitus ([place]) 10/11/2020   Dyspnea 05/21/2020   Elevated troponin 05/20/2020   Essential hypertension 05/20/2020    Diabetes mellitus, type II ([place]) 05/20/2020   Chronic anemia 05/20/2020   Reactive thrombocytosis 05/20/2020   Obesity (BMI 30-39.9) 05/20/2020   Acute on chronic combined systolic and diastolic heart failure ([place]) 05/20/2020   CHF (congestive heart failure) ([place]) 05/19/2020   Chronic combined systolic and diastolic heart failure ([place]) 11/01/2019   Financial difficulties 11/01/2019   Medication management 11/01/2019   Lung nodule 11/01/2019   NICM (nonischemic cardiomyopathy) ([place]) 11/01/2019   Polysubstance abuse ([place]) 11/01/2019   Tobacco use 11/01/2019   HFrEF (heart failure with reduced ejection fraction) (HCC)    Swelling    Symptomatic anemia 10/02/2019   PCP:  Patient, No Pcp Per (Inactive) Pharmacy:   [facility] [address] [place] [address] Phone: [phone] Fax: [phone]  [facility] [address]), [place] [employer] [place] [address] Phone: [phone] Fax: [phone]     Social Determinants of Health (SDOH) Interventions    Readmission Risk Interventions No flowsheet data found.

## 2020-12-07 NOTE — Progress Notes (Signed)
PROGRESS NOTE    Molly Howard  TIW:[id number] DOB: 12-Nov-1985 DOA: 11/27/2020 PCP: Patient, No Pcp Per (Inactive)   Brief Narrative: Taken from H&P. Molly Howard is a 35 y.o. female with medical history significant of nonischemic cardiomyopathy and CHF with EF 30 to 35%, hypertension, hyperlipidemia, diabetes mellitus, GERD, anemia, former smoker, CKD-2, polysubstance abuse, who presents with shortness breath.   Patient states that her shortness breath has been going on for more than 1 week, which has been progressively worsening.  Patient also has dry cough, worsening bilateral leg edema, generalized weakness, approximately 40 pounds of weight gain in the past 2 weeks.  She has orthopnea.  Her shortness breath is worse when laying flat.  Denies chest pain, fever or chills.  Found to have BNP of 1043, COVID PCR negative, mild AKI and chest x-ray positive for cardiomegaly and vascular congestion.  Patient admitted for acute on chronic HFrEF with volume overload and started on IV diuresis.  Cardiology is on board.  Subjective: Slowly improving. Still has LE swelling and dyspnea. Sitting in chair. No new c/o  Assessment & Plan:   Principal Problem:   Acute on chronic combined systolic and diastolic CHF (congestive heart failure) (HCC) Active Problems:   Essential hypertension   HLD (hyperlipidemia)   Type II diabetes mellitus with renal manifestations (HCC)   Chronic kidney disease (CKD), stage II (mild)   Iron deficiency anemia  Acute on chronic combined systolic and diastolic CHF (congestive heart failure) (HCC): 2D echo on 10/11/2020 showed EF of 30-35% with grade 2 diastolic dysfunction.  Patient had orthopnea, 40 pounds of weight gain, elevated BNP 1043, vascular congestion on chest x-ray, clinically consistent with CHF exacerbation. -Cut back on Lasix to 40 mg (was on 80 mg) 3 times a day (little bump in kidney function), continue spironolactone 25 mg once daily and metolazone 5 mg  p.o. daily. Net IO Since Admission: -30,680 mL [12/07/20 1633]  -Strict intake and output.  Patient will need at least another 2 days of diuresis while inpatient per cardio -Daily weight and BMP  Hypokalemia Repleted and resolved  Essential hypertension.  Patient was noncompliant with her blood pressure medications  Blood pressure currently within goal. -Continue with Coreg, spironolactone, Entresto, metolazone along with Lasix.  HLD (hyperlipidemia):  -Continue Lipitor   Type II diabetes mellitus with renal manifestations [facility]): Recent A1c 11.3, poorly controlled.   -70/30 insulin 8 mg twice daily -Sliding scale insulin and Farxiga.    Chronic kidney disease (CKD), stage II (mild): Baseline creatinine is~1.0. Creat 1.2 today  Iron deficiency anemia: Stable hemoglobin,  -Continue iron  Polysubstance abuse and medication noncompliance UDS positive for cocaine and opiate.  Counseled. Patient will need medication assistance on discharge. -TOC aware. Will need to send scripts to med mgmt clinic  Stage III obesity. Estimated body mass index is 36.07 kg/m as calculated from the following:   Height as of this encounter: 5\' 7"  (1.702 m).   Weight as of this encounter: 104.5 kg.  This complicates overall prognosis..  Objective: Vitals:   12/06/20 1922 12/07/20 0414 12/07/20 0820 12/07/20 1554  BP: 124/88 104/75 121/84 115/82  Pulse: 72 87 91 85  Resp: 17 19 19 16   Temp: 98 F (36.7 C) 97.8 F (36.6 C) 97.8 F (36.6 C) (!) 97.4 F (36.3 C)  TempSrc: Oral     SpO2: 98% 100% 97% 92%  Weight:  104.5 kg    Height:        Intake/Output  Summary (Last 24 hours) at 12/07/2020 1633 Last data filed at 12/07/2020 1538 Gross per 24 hour  Intake 1200 ml  Output 4700 ml  Net -3500 ml   Filed Weights   12/05/20 0500 12/06/20 0804 12/07/20 0414  Weight: 106.6 kg 106.8 kg 104.5 kg    Examination:  General.  Well-developed, obese lady, in no acute distress. Pulmonary.  Lungs clear  bilaterally, normal respiratory effort. CV.  Regular rate and rhythm, no JVD, rub or murmur. Abdomen.  Soft, nontender, nondistended, BS positive. CNS.  Alert and oriented x3.  No focal neurologic deficit. Extremities.  1+ LE edema, no cyanosis, pulses intact and symmetrical. Psychiatry.  Judgment and insight appears normal.   DVT prophylaxis: Lovenox Code Status: Full Family Communication: Discussed with patient Disposition Plan:  Status is: Inpatient  Remains inpatient appropriate because:Inpatient level of care appropriate due to severity of illness, needing aggressive diuresis  Dispo: The patient is from: Home              Anticipated d/c is to: Home in next 2-3 days/likely Tue/Wed              Patient currently is not medically stable to d/c.   Difficult to place patient No              Level of care: Progressive Cardiac  All the records are reviewed and case discussed with Care Management/Social Worker. Management plans discussed with the patient, nursing and they are in agreement.  Consultants:  Cardiology  Procedures:  Antimicrobials: None  Data Reviewed: I have personally reviewed following labs and imaging studies  CBC: Recent Labs  Lab 12/01/20 0658 12/04/20 0559 12/05/20 0549 12/07/20 0552  WBC 5.8 5.0 5.3 6.2  HGB 9.2* 9.2* 9.2* 9.3*  HCT 30.8* 30.1* 30.1* 30.8*  MCV 83.5 84.1 84.3 85.1  PLT 479* 463* 439* 414*   Basic Metabolic Panel: Recent Labs  Lab 12/02/20 0741 12/03/20 0342 12/05/20 0549 12/06/20 0811 12/07/20 0552  NA 137 136 136 137 136  K 3.9 3.5 3.5 3.6 3.9  CL 94* 91* 89* 87* 87*  CO2 31 36* 38* 36* 37*  GLUCOSE 131* 99 195* 159* 176*  BUN 27* 26* 26* 26* 27*  CREATININE 1.11* 1.09* 1.16* 1.18* 1.21*  CALCIUM 7.8* 7.9* 8.0* 8.0* 8.0*   GFR: Estimated Creatinine Clearance: 80 mL/min (A) (by C-G formula based on SCr of 1.21 mg/dL (H)). Liver Function Tests: No results for input(s): AST, ALT, ALKPHOS, BILITOT, PROT, ALBUMIN in the  last 168 hours. No results for input(s): LIPASE, AMYLASE in the last 168 hours. No results for input(s): AMMONIA in the last 168 hours. Coagulation Profile: No results for input(s): INR, PROTIME in the last 168 hours. Cardiac Enzymes: No results for input(s): CKTOTAL, CKMB, CKMBINDEX, TROPONINI in the last 168 hours. BNP (last 3 results) No results for input(s): PROBNP in the last 8760 hours. HbA1C: No results for input(s): HGBA1C in the last 72 hours. CBG: Recent Labs  Lab 12/06/20 1207 12/06/20 1627 12/06/20 2025 12/07/20 0819 12/07/20 1128  GLUCAP 252* 261* 210* 167* 232*   Lipid Profile: No results for input(s): CHOL, HDL, LDLCALC, TRIG, CHOLHDL, LDLDIRECT in the last 72 hours. Thyroid Function Tests: No results for input(s): TSH, T4TOTAL, FREET4, T3FREE, THYROIDAB in the last 72 hours. Anemia Panel: No results for input(s): VITAMINB12, FOLATE, FERRITIN, TIBC, IRON, RETICCTPCT in the last 72 hours. Sepsis Labs: No results for input(s): PROCALCITON, LATICACIDVEN in the last 168 hours.  No results found  for this or any previous visit (from the past 240 hour(s)).     Radiology Studies: No results found.  Scheduled Meds:  atorvastatin  20 mg Oral Daily   carvedilol  6.25 mg Oral BID WC   dapagliflozin propanediol  10 mg Oral Daily   enoxaparin (LOVENOX) injection  0.5 mg/kg Subcutaneous Q24H   furosemide  40 mg Intravenous TID   insulin aspart  0-15 Units Subcutaneous TID WC   insulin aspart  0-5 Units Subcutaneous QHS   insulin aspart protamine- aspart  8 Units Subcutaneous BID WC   iron polysaccharides  150 mg Oral Daily   metolazone  5 mg Oral Daily   polyethylene glycol  17 g Oral Daily   potassium chloride  20 mEq Oral BID   sacubitril-valsartan  1 tablet Oral BID   spironolactone  25 mg Oral Daily   Continuous Infusions:   LOS: 10 days   Time spent: 30 minutes. More than 50% of the time was spent in counseling/coordination of care  Delfino Lovett,  MD [facility]  If 7PM-7AM, please contact night-coverage [email or web address]  12/07/2020, 4:33 PM   This record has been created using Conservation officer, historic buildings. Errors have been sought and corrected,but may not always be located. Such creation errors do not reflect on the standard of care.

## 2020-12-07 NOTE — Progress Notes (Signed)
Progress Note  Patient Name: Molly Howard Date of Encounter: 12/07/2020  Primary Cardiologist: Agbor-Etang  Subjective   No chest pain or palpitations. Dyspnea is "much better." Abdominal distension and lower extremity edema are improving. Documented UOP 5.9 L for the past 24 hours, net - 29.5 L for the admission. Weight 106.8-->104.5 kg. Slight up trending in BUN/SCr from 26/1.18 to 27/1.21.   Inpatient Medications    Scheduled Meds:  atorvastatin  20 mg Oral Daily   carvedilol  6.25 mg Oral BID WC   dapagliflozin propanediol  10 mg Oral Daily   enoxaparin (LOVENOX) injection  0.5 mg/kg Subcutaneous Q24H   furosemide  40 mg Intravenous TID   insulin aspart  0-15 Units Subcutaneous TID WC   insulin aspart  0-5 Units Subcutaneous QHS   insulin aspart protamine- aspart  8 Units Subcutaneous BID WC   iron polysaccharides  150 mg Oral Daily   metolazone  5 mg Oral Daily   polyethylene glycol  17 g Oral Daily   potassium chloride  20 mEq Oral BID   sacubitril-valsartan  1 tablet Oral BID   spironolactone  25 mg Oral Daily   Continuous Infusions:  PRN Meds: acetaminophen, albuterol, chlorpheniramine-HYDROcodone, dextromethorphan-guaiFENesin, hydrALAZINE, ondansetron (ZOFRAN) IV   Vital Signs    Vitals:   12/06/20 1623 12/06/20 1922 12/07/20 0414 12/07/20 0820  BP: 111/90 124/88 104/75 121/84  Pulse: 89 72 87 91  Resp:  17 19 19   Temp:  98 F (36.7 C) 97.8 F (36.6 C) 97.8 F (36.6 C)  TempSrc:  Oral    SpO2: 100% 98% 100% 97%  Weight:   104.5 kg   Height:        Intake/Output Summary (Last 24 hours) at 12/07/2020 0833 Last data filed at 12/07/2020 0400 Gross per 24 hour  Intake 840 ml  Output 6800 ml  Net -5960 ml    Filed Weights   12/05/20 0500 12/06/20 0804 12/07/20 0414  Weight: 106.6 kg 106.8 kg 104.5 kg    Telemetry    Not on telemetry - Personally Reviewed  ECG    No new tracings - Personally Reviewed  Physical Exam   GEN: No acute distress.    Neck: No JVD. Cardiac: RRR, no murmurs, rubs, or gallops.  Respiratory: Improving, mildly diminished breath sounds along the bases bilaterally.  GI: Non-tender, improving distension.   MS: 1+ bilateral woody edema; No deformity. Neuro:  Alert and oriented x 3; Nonfocal.  Psych: Normal affect.  Labs    Chemistry Recent Labs  Lab 12/05/20 0549 12/06/20 0811 12/07/20 0552  NA 136 137 136  K 3.5 3.6 3.9  CL 89* 87* 87*  CO2 38* 36* 37*  GLUCOSE 195* 159* 176*  BUN 26* 26* 27*  CREATININE 1.16* 1.18* 1.21*  CALCIUM 8.0* 8.0* 8.0*  GFRNONAA >60 >60 60*  ANIONGAP 9 14 12       Hematology Recent Labs  Lab 12/04/20 0559 12/05/20 0549 12/07/20 0552  WBC 5.0 5.3 6.2  RBC 3.58* 3.57* 3.62*  HGB 9.2* 9.2* 9.3*  HCT 30.1* 30.1* 30.8*  MCV 84.1 84.3 85.1  MCH 25.7* 25.8* 25.7*  MCHC 30.6 30.6 30.2  RDW 18.0* 18.1* 18.3*  PLT 463* 439* 414*     Cardiac EnzymesNo results for input(s): TROPONINI in the last 168 hours. No results for input(s): TROPIPOC in the last 168 hours.   BNPNo results for input(s): BNP, PROBNP in the last 168 hours.   DDimer No results for  input(s): DDIMER in the last 168 hours.   Radiology    No results found.  Cardiac Studies   2D echo 10/2020: 1. Left ventricular ejection fraction, by estimation, is 30 to 35%. The  left ventricle has moderate to severely decreased function. The left  ventricle demonstrates global hypokinesis. Left ventricular diastolic  parameters are consistent with Grade II  diastolic dysfunction (pseudonormalization).   2. Right ventricular systolic function is mildly reduced. The right  ventricular size is normal.   3. A small pericardial effusion is present.   4. The mitral valve is normal in structure. No evidence of mitral valve  regurgitation.   5. Tricuspid valve regurgitation is moderate.   6. The aortic valve is tricuspid. Aortic valve regurgitation is not  visualized.  __________  2D echo 05/2020: 1. Left  ventricular ejection fraction, by estimation, is 20 to 25%. The  left ventricle has severely decreased function. The left ventricle  demonstrates global hypokinesis. The left ventricular internal cavity size  was mildly dilated. Indeterminate  diastolic filling due to E-A fusion.   2. Right ventricular systolic function is normal. The right ventricular  size is mildly enlarged. There is severely elevated pulmonary artery  systolic pressure.   3. The mitral valve is normal in structure. Trivial mitral valve  regurgitation. No evidence of mitral stenosis.   4. Tricuspid valve regurgitation is moderate.   5. The aortic valve was not well visualized. Aortic valve regurgitation  is not visualized. No aortic stenosis is present.   6. The inferior vena cava is dilated in size with <50% respiratory  variability, suggesting right atrial pressure of 15 mmHg. __________  Eugenie Birks MPI 10/2019: There was no ST segment deviation noted during stress. T wave inversion was noted during stress in the I and aVL leads. Defect 1: There is a small defect of mild severity present in the apex location. This is likely due to breast attenuation artifact Nuclear stress EF: 30%. This is a high risk study due to low EF. Low EF seems to be due to nonischemic cardiomyopathy. __________  2D echo 10/2019: 1. Left ventricular ejection fraction, by estimation, is 35 to 40%. The  left ventricle has moderately decreased function. The left ventricle  demonstrates global hypokinesis. Left ventricular diastolic parameters are  consistent with Grade II diastolic  dysfunction (pseudonormalization).   2. Right ventricular systolic function is normal. The right ventricular  size is normal.   3. The mitral valve is normal in structure. No evidence of mitral valve  regurgitation. No evidence of mitral stenosis.   4. The aortic valve is normal in structure. Aortic valve regurgitation is  not visualized. No aortic stenosis is  present.   5. The inferior vena cava is normal in size with greater than 50%  respiratory variability, suggesting right atrial pressure of 3 mmHg.   Patient Profile     35 y.o. 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  Assessment & Plan    1.  Acute on chronic combined systolic and diastolic CHF secondary to NICM: -Volume status is improving, remains volume up -She will likely need another day or two of IV diuresis  -Readmission likely in the setting of medication nonadherence and ongoing polysubstance abuse -With slight up trending  in renal function, IV Lasix has been decreased to 40 mg tid with continuation of metolazone this morning -KCl repletion as indicated -She will also continue carvedilol, Entresto, spironolactone and Farxiga -Daily weights -Strict I's and O's  2.  Polysubstance abuse: -She has denied cocaine use since 10/2020, however urine drug screen was positive for cocaine and opiates upon this admission -Complete cessation is recommended  3.  Noncompliance: -She indicates she cannot afford medications, does not work, and does not have health insurance -She will need TOC assistance in obtaining co-pay cards and medication assistance program paperwork prior to discharge  4.  CKD stage II: -Slight up trending in renal function this morning -Monitor with diuresis    For questions or updates, please contact [facility] Please consult [email or web address] for contact info under Cardiology/STEMI.    Signed, Eula Listen, PA-C [facility] Pager: [phone] 12/07/2020, 8:33 AM

## 2020-12-08 ENCOUNTER — Other Ambulatory Visit: Payer: Self-pay

## 2020-12-08 LAB — CBC
HCT: 31 % — ABNORMAL LOW (ref 36.0–46.0)
Hemoglobin: 9.3 g/dL — ABNORMAL LOW (ref 12.0–15.0)
MCH: 25.5 pg — ABNORMAL LOW (ref 26.0–34.0)
MCHC: 30 g/dL (ref 30.0–36.0)
MCV: 84.9 fL (ref 80.0–100.0)
Platelets: 426 10*3/uL — ABNORMAL HIGH (ref 150–400)
RBC: 3.65 MIL/uL — ABNORMAL LOW (ref 3.87–5.11)
RDW: 18.2 % — ABNORMAL HIGH (ref 11.5–15.5)
WBC: 6.1 10*3/uL (ref 4.0–10.5)
nRBC: 0 % (ref 0.0–0.2)

## 2020-12-08 LAB — BASIC METABOLIC PANEL
Anion gap: 10 (ref 5–15)
BUN: 26 mg/dL — ABNORMAL HIGH (ref 6–20)
CO2: 37 mmol/L — ABNORMAL HIGH (ref 22–32)
Calcium: 8.3 mg/dL — ABNORMAL LOW (ref 8.9–10.3)
Chloride: 88 mmol/L — ABNORMAL LOW (ref 98–111)
Creatinine, Ser: 1.1 mg/dL — ABNORMAL HIGH (ref 0.44–1.00)
GFR, Estimated: >60 mL/min (ref >60)
Glucose, Bld: 175 mg/dL — ABNORMAL HIGH (ref 70–99)
Potassium: 3.9 mmol/L (ref 3.5–5.1)
Sodium: 135 mmol/L (ref 135–145)

## 2020-12-08 LAB — GLUCOSE, CAPILLARY
Glucose-Capillary: 188 mg/dL — ABNORMAL HIGH (ref 70–99)
Glucose-Capillary: 241 mg/dL — ABNORMAL HIGH (ref 70–99)

## 2020-12-08 MED ORDER — RIGHTEST GS550 BLOOD GLUCOSE VI STRP
ORAL_STRIP | 11 refills | Status: DC
  Filled 2020-12-08: qty 100, 30d supply, fill #0

## 2020-12-08 MED ORDER — INSULIN ASPART PROT & ASPART (70-30 MIX) 100 UNIT/ML ~~LOC~~ SUSP
10.0000 [IU] | Freq: Two times a day (BID) | SUBCUTANEOUS | 11 refills | Status: DC
Start: 2020-12-08 — End: 2021-06-18
  Filled 2020-12-08: qty 10, 28d supply, fill #0
  Filled 2021-01-14 (×2): qty 10, 28d supply, fill #1

## 2020-12-08 MED ORDER — POLYSACCHARIDE IRON COMPLEX 150 MG PO CAPS
150.0000 mg | ORAL_CAPSULE | Freq: Every day | ORAL | 0 refills | Status: DC
  Filled 2020-12-08: qty 30, 30d supply, fill #0

## 2020-12-08 MED ORDER — ASPIRIN 81 MG PO TBEC
81.0000 mg | DELAYED_RELEASE_TABLET | Freq: Every day | ORAL | 0 refills | Status: AC
  Filled 2020-12-08: qty 30, 30d supply, fill #0

## 2020-12-08 MED ORDER — CARVEDILOL 6.25 MG PO TABS
6.2500 mg | ORAL_TABLET | Freq: Two times a day (BID) | ORAL | 0 refills | Status: DC
  Filled 2020-12-08: qty 60, 30d supply, fill #0

## 2020-12-08 MED ORDER — SACUBITRIL-VALSARTAN 24-26 MG PO TABS
1.0000 | ORAL_TABLET | Freq: Two times a day (BID) | ORAL | 0 refills | Status: DC
  Filled 2020-12-08: qty 60, 30d supply, fill #0

## 2020-12-08 MED ORDER — TORSEMIDE 20 MG PO TABS
40.0000 mg | ORAL_TABLET | Freq: Every day | ORAL | 0 refills | Status: DC
  Filled 2020-12-08: qty 60, 30d supply, fill #0

## 2020-12-08 MED ORDER — TORSEMIDE 20 MG PO TABS
40.0000 mg | ORAL_TABLET | Freq: Every day | ORAL | Status: DC
  Administered 2020-12-08: 40 mg via ORAL
  Filled 2020-12-08: qty 2

## 2020-12-08 MED ORDER — ATORVASTATIN CALCIUM 20 MG PO TABS
ORAL_TABLET | Freq: Every day | ORAL | 0 refills | Status: DC
  Filled 2020-12-08: qty 30, 30d supply, fill #0

## 2020-12-08 MED ORDER — RIGHTEST GM550 BLOOD GLUCOSE W/DEVICE KIT
PACK | 0 refills | Status: DC
  Filled 2020-12-08: qty 1, 30d supply, fill #0

## 2020-12-08 MED ORDER — DAPAGLIFLOZIN PROPANEDIOL 10 MG PO TABS
10.0000 mg | ORAL_TABLET | Freq: Every day | ORAL | 0 refills | Status: DC
  Filled 2020-12-08 (×2): qty 30, 30d supply, fill #0

## 2020-12-08 MED ORDER — SPIRONOLACTONE 25 MG PO TABS
25.0000 mg | ORAL_TABLET | Freq: Every day | ORAL | 0 refills | Status: DC
Start: 2020-12-08 — End: 2020-12-11
  Filled 2020-12-08: qty 30, 30d supply, fill #0

## 2020-12-08 MED ORDER — BLOOD GLUCOSE MONITOR KIT
PACK | 0 refills | Status: DC
  Filled 2020-12-08: qty 1, fill #0

## 2020-12-08 MED ORDER — LOSARTAN POTASSIUM 50 MG PO TABS
50.0000 mg | ORAL_TABLET | Freq: Every day | ORAL | 11 refills | Status: DC
  Filled 2020-12-08: qty 30, 30d supply, fill #0

## 2020-12-08 MED ORDER — RIGHTEST GL300 LANCETS MISC
11 refills | Status: DC
  Filled 2020-12-08: qty 100, 30d supply, fill #0

## 2020-12-08 NOTE — Progress Notes (Signed)
Progress Note  Patient Name: Molly Howard Date of Encounter: 12/08/2020  [facility] Cardiologist: Debbe Odea, MD   Subjective   UOP -1.9L. She denies sob or chest pain. Kidney function better this AM.   Inpatient Medications    Scheduled Meds:  atorvastatin  20 mg Oral Daily   carvedilol  6.25 mg Oral BID WC   dapagliflozin propanediol  10 mg Oral Daily   enoxaparin (LOVENOX) injection  0.5 mg/kg Subcutaneous Q24H   furosemide  40 mg Intravenous TID   insulin aspart  0-15 Units Subcutaneous TID WC   insulin aspart  0-5 Units Subcutaneous QHS   insulin aspart protamine- aspart  8 Units Subcutaneous BID WC   iron polysaccharides  150 mg Oral Daily   metolazone  5 mg Oral Daily   polyethylene glycol  17 g Oral Daily   potassium chloride  20 mEq Oral BID   sacubitril-valsartan  1 tablet Oral BID   spironolactone  25 mg Oral Daily   Continuous Infusions:  PRN Meds: acetaminophen, albuterol, chlorpheniramine-HYDROcodone, dextromethorphan-guaiFENesin, hydrALAZINE, ondansetron (ZOFRAN) IV   Vital Signs    Vitals:   12/07/20 1554 12/07/20 2155 12/08/20 0322 12/08/20 0405  BP: 115/82 118/79  108/76  Pulse: 85 93  84  Resp: 16 18  18   Temp: (!) 97.4 F (36.3 C) 97.8 F (36.6 C)  97.9 F (36.6 C)  TempSrc:  Oral  Oral  SpO2: 92% 95%  98%  Weight:   (!) 162 kg   Height:        Intake/Output Summary (Last 24 hours) at 12/08/2020 0726 Last data filed at 12/07/2020 1538 Gross per 24 hour  Intake 720 ml  Output 1900 ml  Net -1180 ml   Last 3 Weights 12/08/2020 12/07/2020 12/06/2020  Weight (lbs) 357 lb 3.2 oz 230 lb 4.8 oz 235 lb 6.6 oz  Weight (kg) 162.025 kg 104.463 kg 106.781 kg  Some encounter information is confidential and restricted. Go to Review Flowsheets activity to see all data.      Telemetry    N/A - Personally Reviewed  ECG     No new- Personally Reviewed  Physical Exam   GEN: No acute distress.   Neck: + JVD Cardiac: RRR, no murmurs, rubs,  or gallops.  Respiratory: Clear to auscultation bilaterally. GI: Soft, nontender, non-distended  MS: No edema; No deformity. Neuro:  Nonfocal  Psych: Normal affect   Labs    High Sensitivity Troponin:   Recent Labs  Lab 11/27/20 0623  TROPONINIHS 13      Chemistry Recent Labs  Lab 12/06/20 0811 12/07/20 0552 12/08/20 0526  NA 137 136 135  K 3.6 3.9 3.9  CL 87* 87* 88*  CO2 36* 37* 37*  GLUCOSE 159* 176* 175*  BUN 26* 27* 26*  CREATININE 1.18* 1.21* 1.10*  CALCIUM 8.0* 8.0* 8.3*  GFRNONAA >60 60* >60  ANIONGAP 14 12 10      Hematology Recent Labs  Lab 12/05/20 0549 12/07/20 0552 12/08/20 0526  WBC 5.3 6.2 6.1  RBC 3.57* 3.62* 3.65*  HGB 9.2* 9.3* 9.3*  HCT 30.1* 30.8* 31.0*  MCV 84.3 85.1 84.9  MCH 25.8* 25.7* 25.5*  MCHC 30.6 30.2 30.0  RDW 18.1* 18.3* 18.2*  PLT 439* 414* 426*    BNPNo results for input(s): BNP, PROBNP in the last 168 hours.   DDimer No results for input(s): DDIMER in the last 168 hours.   Radiology    No results found.  Cardiac Studies  2D echo 10/2020: 1. Left ventricular ejection fraction, by estimation, is 30 to 35%. The  left ventricle has moderate to severely decreased function. The left  ventricle demonstrates global hypokinesis. Left ventricular diastolic  parameters are consistent with Grade II  diastolic dysfunction (pseudonormalization).   2. Right ventricular systolic function is mildly reduced. The right  ventricular size is normal.   3. A small pericardial effusion is present.   4. The mitral valve is normal in structure. No evidence of mitral valve  regurgitation.   5. Tricuspid valve regurgitation is moderate.   6. The aortic valve is tricuspid. Aortic valve regurgitation is not  visualized. __________   2D echo 05/2020: 1. Left ventricular ejection fraction, by estimation, is 20 to 25%. The  left ventricle has severely decreased function. The left ventricle  demonstrates global hypokinesis. The left  ventricular internal cavity size  was mildly dilated. Indeterminate  diastolic filling due to E-A fusion.   2. Right ventricular systolic function is normal. The right ventricular  size is mildly enlarged. There is severely elevated pulmonary artery  systolic pressure.   3. The mitral valve is normal in structure. Trivial mitral valve  regurgitation. No evidence of mitral stenosis.   4. Tricuspid valve regurgitation is moderate.   5. The aortic valve was not well visualized. Aortic valve regurgitation  is not visualized. No aortic stenosis is present.   6. The inferior vena cava is dilated in size with <50% respiratory  variability, suggesting right atrial pressure of 15 mmHg. __________   Eugenie Birks MPI 10/2019: There was no ST segment deviation noted during stress. T wave inversion was noted during stress in the I and aVL leads. Defect 1: There is a small defect of mild severity present in the apex location. This is likely due to breast attenuation artifact Nuclear stress EF: 30%. This is a high risk study due to low EF. Low EF seems to be due to nonischemic cardiomyopathy. __________   2D echo 10/2019: 1. Left ventricular ejection fraction, by estimation, is 35 to 40%. The  left ventricle has moderately decreased function. The left ventricle  demonstrates global hypokinesis. Left ventricular diastolic parameters are  consistent with Grade II diastolic  dysfunction (pseudonormalization).   2. Right ventricular systolic function is normal. The right ventricular  size is normal.   3. The mitral valve is normal in structure. No evidence of mitral valve  regurgitation. No evidence of mitral stenosis.   4. The aortic valve is normal in structure. Aortic valve regurgitation is  not visualized. No aortic stenosis is present.   5. The inferior vena cava is normal in size with greater than 50%  respiratory variability, suggesting right atrial pressure of 3 mmHg.    Patient Profile      35 y.o. female with h/o chronic combined systolic and diastolic CHF secondary to NICM, polysubstance abuse with ongoing cocaine use poorly controlled DM2, CKD stage 2, anemia, HLD, obesity, and GERD who is being seeing for acute HFrEF in the setting of medication noncompliance and cocaine use.   Assessment & Plan    Acute on chronic combined systolic and diastolic CHF secondary to NICM - admitted in the setting of medication noncompliance and ongoing cocaine use - Echo 10/2020 showed LVEF 30-35%, G2DD, golabl HK, mildly reduced RV function, mod TR - Prior Myoview 2021 showed suspected NICM, EF 30% - IV lasix 40mg  TID and metolazone - UOP -1.9L, net -30L - kidney function stable this morning Continue coreg 6.25mg BID, farxiga  10mg  daily, Entresto 24-26mg BID, spironolactone 25mg  daily. BP and heart rate good.  - No LLE appreciated, but do appreciated JVD. Suspect will not need IV lasix much longer  Polysubstance abuse - UDS positive for cocaine and opiates - cessation advised  CKD stage 2 - kidney function stable this morning  Noncompliance - she has difficulty affording medications - TOC/social work following   For questions or updates, please contact [facility] Please consult [email or web address] for contact info under        Signed, Melodie Ashworth , PA-C  12/08/2020, 7:26 AM

## 2020-12-08 NOTE — Progress Notes (Signed)
Inpatient Diabetes Program Recommendations  AACE/ADA: New Consensus Statement on Inpatient Glycemic Control   Target Ranges:  Prepandial:   less than 140 mg/dL      Peak postprandial:   less than 180 mg/dL (1-2 hours)      Critically ill patients:  140 - 180 mg/dL   Results for Molly Howard, Molly Howard (MRN [id number]) as of 12/08/2020 11:05  Ref. Range 12/07/2020 08:19 12/07/2020 11:28 12/07/2020 17:04 12/07/2020 21:44 12/08/2020 09:16  Glucose-Capillary Latest Ref Range: 70 - 99 mg/dL 962 (H) 229 (H) 798 (H) 258 (H) 188 (H)   Review of Glycemic Control  Diabetes history: DM2 Outpatient Diabetes medications: 70/30 10 units BID Current orders for Inpatient glycemic control: 70/30 8 units BID, Novolog 0-15 units TID with meals, Novolog 0-5 units QHS, Farxiga 10 mg daily  Inpatient Diabetes Program Recommendations:    Insulin: Please consider increasing 70/30 to 10 units BID.  NOTE: Noted TOC note on 12/07/20 with plan for patient to get medications from [facility] at discharge. When patient was inpatient in June (10/11/20-10/18/20) patient had stated she had been out of insulin for at least 1 month and she could not get medications from [facility] until she provides them with paperwork. Noted telephone encounter on 10/23/20 by Willeen Niece which notes, "Patient failed to provide 2022 proof of income.  No additional medication assistance will be provided by [facility] without the required proof of income documentation.  Patient notified by letter." Per office visit note by Dr. Azucena Cecil on 11/13/20, patient was not able to get medications after last hospital discharge because she still needed to provide [facility] with needed documents. Sent communication to A. Hill, LCSW with TOC to make aware that patient needs to provide [facility] with documents before medications will be filled there.  Thanks, Orlando Penner, RN, MSN, CDE Diabetes Coordinator Inpatient Diabetes Program [phone] (Team Pager from  8am to 5pm)

## 2020-12-08 NOTE — Progress Notes (Signed)
Discharge instructions explained to pt/ verbalized an understanding/ iv removed/ assistance with meds given / medications delivered to room / assistance with transportation home given/ will transport off unit via wheelchair when ride arrives.

## 2020-12-08 NOTE — Progress Notes (Addendum)
   Heart Failure Nurse Navigator Note  Met with patient today, she has been cleared by cardiology for discharge home.  Talked about weighing herself, correlated today's hospital weight should be close to her home weight tomorrow give or take a couple pounds.  Also discussed again signs and symptoms to report to physician.  Also listed her medications and she could tell me why she took them.   She states she remembers how bad she felt when she came in and how good she is feeling now and wants to continue with taking care of her self with diet,weights and fluid restriction .  She has an outpatient heart failure clinic appointment on this Wednesday, August 10 at 1130.  Patient's transportation to be scheduled with [facility] transportation.  She has no further questions.  Pricilla Riffle RN [facility]

## 2020-12-09 NOTE — Progress Notes (Signed)
Patient ID: Molly Howard, female    DOB: [birth year], 35 y.o.   MRN: [id number]  HPI  Molly Howard is a 35 y/o female with a history of DM, HTN, previous drug use and chronic heart failure.   Echo report from 10/11/20 reviewed and showed an EF of 30-35% along with moderate TR.   Admitted 11/27/20 due to acute on chronic HF with orthopnea and 40 pound weight gain. Initially given IV lasix. Given IV hydralazine for HTN. Cardiology consult obtained. IV meds transitioned to oral medications. UDS + for cocaine and opiates. Discharged after 11 days. Admitted 10/10/20 due to acute on chronic HF. Cardiology consult obtained. Initially given IV lasix with transition to oral diuretics. UDS + for cocaine. Discharged after 8 days after Molly Howard insisted on leaving.   Molly Howard presents today for a follow-up visit with a chief complaint of minimal shortness of breath upon moderate exertion. Molly Howard describes this as chronic in nature having been present for several months although markedly improved from her last visit here. Molly Howard has associated fatigue, palpitations, light-headedness & chronic pain along with this. Molly Howard denies any difficulty sleeping, abdominal distention, pedal edema, chest pain, cough or weight gain.   Molly Howard has all her medications and says that Molly Howard feels so much better. Has not turned in her paperwork to [facility] yet and says that Molly Howard has no transportation to get it there as her sister doesn't get off work until 6pm when it's closed.   Past Medical History:  Diagnosis Date   Acid reflux    Chronic HFrEF (heart failure with reduced ejection fraction) ([place])    a. 10/2019 Echo: EF 35-40%, GrII DD; b. 05/2020 Echo: EF 20-25%, glob HK; c. 10/2020 Echo: EF 30-35%, glob HK. GrII DD, Mildly red RV fxn. Mod TR.   CKD (chronic kidney disease), stage II    Diabetes mellitus ([place])    H/O medication noncompliance    Hypertension    Microcytic anemia    NICM (nonischemic cardiomyopathy) ([place])    a. 10/2019  Echo: EF 35-40%; b. 10/2019 MV: No ischemia. Small apical defect-->breast attenuation; c. 05/2020 Echo: EF 20-25%; d. 10/2020 Echo: EF 30-35%, glob HK. GrII DD, Mildly red RV fxn. Mod TR.   Obesity    Polysubstance abuse ([place])    Past Surgical History:  Procedure Laterality Date   CHOLECYSTECTOMY     HERNIA REPAIR     Family History  Problem Relation Age of Onset   Heart failure Mother        a. onset in her 107s   Diabetes Mother    Hypertension Father    Diabetes Father    Social History   Tobacco Use   Smoking status: Former    Packs/day: 0.50    Types: Cigarettes   Smokeless tobacco: Never  Substance Use Topics   Alcohol use: Not Currently    Comment: ~ 4 shots/day on weekends only   Allergies  Allergen Reactions   No [facility] Food Allergies Rash and Other (See Comments)    Lemons   Prior to Admission medications   Medication Sig Start Date End Date Taking? Authorizing Provider  aspirin 81 MG EC tablet Take 1 tablet (81 mg total) by mouth once daily. 12/08/20 01/07/21 Yes Max Sane, MD  atorvastatin (LIPITOR) 20 MG tablet TAKE ONE TABLET BY MOUTH EVERY DAY. 12/08/20 01/07/21 Yes Max Sane, MD  blood glucose meter kit and supplies KIT Dispense based on patient and insurance preference. Use up  to four times daily as directed. (FOR ICD-9 250.00, 250.01). 12/08/20  Yes Max Sane, MD  carvedilol (COREG) 6.25 MG tablet Take 1 tablet (6.25 mg total) by mouth 2 (two) times daily with a meal. 12/08/20 01/07/21 Yes Max Sane, MD  dapagliflozin propanediol (FARXIGA) 10 MG TABS tablet Take 1 tablet (10 mg total) by mouth once daily. 12/08/20  Yes Max Sane, MD  insulin aspart protamine- aspart (NOVOLOG MIX 70/30) (70-30) 100 UNIT/ML injection Inject 0.1 mLs (10 Units total) into the skin 2 (two) times daily with a meal. 12/08/20  Yes Max Sane, MD  iron polysaccharides (NIFEREX) 150 MG capsule Take 1 capsule (150 mg total) by mouth once daily. 12/08/20 01/07/21 Yes Max Sane, MD  losartan  (COZAAR) 50 MG tablet Take 1 tablet (50 mg total) by mouth once daily. 12/08/20 12/08/21 Yes Max Sane, MD  spironolactone (ALDACTONE) 25 MG tablet Take 1 tablet (25 mg total) by mouth once daily. 12/08/20 01/07/21 Yes Max Sane, MD  torsemide (DEMADEX) 20 MG tablet Take 2 tablets (40 mg total) by mouth once daily. 12/08/20  Yes Max Sane, MD    Review of Systems  Constitutional:  Positive for fatigue (improving). Negative for appetite change.  HENT:  Negative for congestion, postnasal drip and sore throat.   Eyes: Negative.   Respiratory:  Positive for shortness of breath ("much better"). Negative for cough.   Cardiovascular:  Positive for palpitations. Negative for chest pain and leg swelling.  Gastrointestinal:  Negative for abdominal distention and abdominal pain.  Endocrine: Negative.   Genitourinary: Negative.   Musculoskeletal:  Positive for arthralgias (left shoulder) and back pain.  Skin: Negative.   Allergic/Immunologic: Negative.   Neurological:  Positive for light-headedness. Negative for dizziness.  Hematological:  Negative for adenopathy. Does not bruise/bleed easily.  Psychiatric/Behavioral:  Negative for dysphoric mood and sleep disturbance (sleeping on 2 pillows). The patient is not nervous/anxious.    Vitals:   12/10/20 1133  BP: 113/82  Pulse: 94  Resp: 16  SpO2: 93%  Weight: 226 lb (102.5 kg)  Height: '5\' 7"'  (1.702 m)   Wt Readings from Last 3 Encounters:  12/10/20 226 lb (102.5 kg)  12/08/20 224 lb (101.6 kg)  11/13/20 288 lb (130.6 kg)   Lab Results  Component Value Date   CREATININE 1.10 (H) 12/08/2020   CREATININE 1.21 (H) 12/07/2020   CREATININE 1.18 (H) 12/06/2020    Physical Exam Vitals and nursing note reviewed.  Constitutional:      Appearance: Normal appearance.  HENT:     Head: Normocephalic and atraumatic.  Cardiovascular:     Rate and Rhythm: Normal rate and regular rhythm.  Pulmonary:     Effort: Pulmonary effort is normal. No  respiratory distress.     Breath sounds: No wheezing or rales.  Abdominal:     General: There is no distension.     Tenderness: There is no abdominal tenderness.  Musculoskeletal:        General: No tenderness.     Cervical back: Normal range of motion and neck supple.     Right lower leg: No edema.     Left lower leg: No edema.  Skin:    General: Skin is warm and dry.  Neurological:     General: No focal deficit present.     Mental Status: Molly Howard is alert and oriented to person, place, and time.  Psychiatric:        Mood and Affect: Mood normal.  Behavior: Behavior normal.        Thought Content: Thought content normal.    Assessment & Plan:  1: Chronic heart failure with reduced ejection fraction- - NYHA class II - weighing daily; reminded to call for an overnight weight gain of > 2 pounds or a weekly weight gain of > 5 pounds - weight down 47 pounds from last visit here 1 month ago - saw cardiology ([place]Etang) 11/13/20 & returns 01/01/21 - on GDMT of carvedilol, losartan, farxiga and spironolactone - consider titrating up losartan or switching to entresto - contacted paramedicine program to assist with getting paperwork from patient to [facility]; paramedic says that Molly Howard can stop by patient's home tomorrow and get paperwork; emphasized to patient to look over paperwork, make sure everything was signed and have all documentation together for paramedic  - BNP 11/27/20 was 1043.9  2: HTN- - BP looks good today - BMP 12/08/20 reviewed and showed sodium 135, potassium 3.9, creatinine 1.10 & GFR >60  3: DM- - A1c 10/11/20 was 11.3% - fasting glucose at home this morning was 160  4: Substance use- - UDS + for cocaine during recent admission; Molly Howard currently denies any drug use - states last use ~ 2 months   Medication bottles reviewed.   Return in 2 months or sooner for any questions/problems before then.

## 2020-12-10 ENCOUNTER — Encounter: Payer: Self-pay | Admitting: Family

## 2020-12-10 ENCOUNTER — Ambulatory Visit: Payer: Medicaid Other | Attending: Family | Admitting: Family

## 2020-12-10 ENCOUNTER — Other Ambulatory Visit: Payer: Self-pay

## 2020-12-10 VITALS — BP 113/82 | HR 94 | Resp 16 | Ht 67.0 in | Wt 226.0 lb

## 2020-12-10 DIAGNOSIS — F1490 Cocaine use, unspecified, uncomplicated: Secondary | ICD-10-CM | POA: Insufficient documentation

## 2020-12-10 DIAGNOSIS — N182 Chronic kidney disease, stage 2 (mild): Secondary | ICD-10-CM | POA: Insufficient documentation

## 2020-12-10 DIAGNOSIS — Z7984 Long term (current) use of oral hypoglycemic drugs: Secondary | ICD-10-CM | POA: Diagnosis not present

## 2020-12-10 DIAGNOSIS — I10 Essential (primary) hypertension: Secondary | ICD-10-CM

## 2020-12-10 DIAGNOSIS — Z794 Long term (current) use of insulin: Secondary | ICD-10-CM | POA: Diagnosis not present

## 2020-12-10 DIAGNOSIS — E1165 Type 2 diabetes mellitus with hyperglycemia: Secondary | ICD-10-CM

## 2020-12-10 DIAGNOSIS — Z8249 Family history of ischemic heart disease and other diseases of the circulatory system: Secondary | ICD-10-CM | POA: Diagnosis not present

## 2020-12-10 DIAGNOSIS — E1122 Type 2 diabetes mellitus with diabetic chronic kidney disease: Secondary | ICD-10-CM | POA: Diagnosis not present

## 2020-12-10 DIAGNOSIS — Z7982 Long term (current) use of aspirin: Secondary | ICD-10-CM | POA: Diagnosis not present

## 2020-12-10 DIAGNOSIS — Z79899 Other long term (current) drug therapy: Secondary | ICD-10-CM | POA: Insufficient documentation

## 2020-12-10 DIAGNOSIS — Z87891 Personal history of nicotine dependence: Secondary | ICD-10-CM | POA: Insufficient documentation

## 2020-12-10 DIAGNOSIS — I130 Hypertensive heart and chronic kidney disease with heart failure and stage 1 through stage 4 chronic kidney disease, or unspecified chronic kidney disease: Secondary | ICD-10-CM | POA: Insufficient documentation

## 2020-12-10 DIAGNOSIS — F1910 Other psychoactive substance abuse, uncomplicated: Secondary | ICD-10-CM

## 2020-12-10 DIAGNOSIS — I5022 Chronic systolic (congestive) heart failure: Secondary | ICD-10-CM | POA: Diagnosis not present

## 2020-12-10 DIAGNOSIS — G8929 Other chronic pain: Secondary | ICD-10-CM | POA: Insufficient documentation

## 2020-12-10 NOTE — Discharge Summary (Signed)
[place] at [facility]   PATIENT NAME: Molly Howard    MR#:  [id number]  DATE OF BIRTH:  01/28/1986  DATE OF ADMISSION:  11/27/2020   ADMITTING PHYSICIAN: Lorretta Harp, MD  DATE OF DISCHARGE: 12/08/2020  1:50 PM  PRIMARY CARE PHYSICIAN: Patient, No Pcp Per (Inactive)   ADMISSION DIAGNOSIS:  Acute on chronic systolic congestive heart failure (HCC) [I50.23] Acute on chronic combined systolic and diastolic CHF (congestive heart failure) (HCC) [I50.43] DISCHARGE DIAGNOSIS:  Principal Problem:   Acute on chronic combined systolic and diastolic CHF (congestive heart failure) (HCC) Active Problems:   Essential hypertension   HLD (hyperlipidemia)   Type II diabetes mellitus with renal manifestations (HCC)   Chronic kidney disease (CKD), stage II (mild)   Iron deficiency anemia  SECONDARY DIAGNOSIS:   Past Medical History:  Diagnosis Date  . Acid reflux   . Chronic HFrEF (heart failure with reduced ejection fraction) (HCC)    a. 10/2019 Echo: EF 35-40%, GrII DD; b. 05/2020 Echo: EF 20-25%, glob HK; c. 10/2020 Echo: EF 30-35%, glob HK. GrII DD, Mildly red RV fxn. Mod TR.  . CKD (chronic kidney disease), stage II   . Diabetes mellitus (HCC)   . H/O medication noncompliance   . Hypertension   . Microcytic anemia   . NICM (nonischemic cardiomyopathy) (HCC)    a. 10/2019 Echo: EF 35-40%; b. 10/2019 MV: No ischemia. Small apical defect-->breast attenuation; c. 05/2020 Echo: EF 20-25%; d. 10/2020 Echo: EF 30-35%, glob HK. GrII DD, Mildly red RV fxn. Mod TR.  Marland Kitchen Obesity   . Polysubstance abuse [facility])    HOSPITAL COURSE:  Molly Howard is a 35 y.o. female with medical history significant of nonischemic cardiomyopathy and CHF with EF 30 to 35%, hypertension, hyperlipidemia, diabetes mellitus, GERD, anemia, former smoker, CKD-2, polysubstance abuse admitted for CHF exacerbation with worsening shortness breath. She also had dry cough, worsening bilateral leg edema, orthopnea, PND, generalized  weakness, approximately 40 pounds of weight gain 2 weeks before admission  in ED, She was found to have BNP of 1043, COVID PCR negative, mild AKI and chest x-ray positive for cardiomegaly and vascular congestion.  Patient admitted for acute on chronic HFrEF with volume overload and started on IV diuresis.    Acute on chronic combined systolic and diastolic CHF (congestive heart failure) (HCC): 2D echo on 10/11/2020 showed EF of 30-35% with grade 2 diastolic dysfunction.  Patient had orthopnea, 40 pounds of weight gain, elevated BNP 1043, vascular congestion on chest x-ray, clinically consistent with CHF exacerbation. -Diuresed aggressively while in the hospital.   Net IO Since Admission: -30,680 mL [12/07/20 1633]  - close f/up with CHF clinic and with cardiology at D/C   Hypokalemia - Repleted and resolved   Essential hypertension.  Patient was noncompliant with her blood pressure medications -Continue below meds as prescribed. Cardio was hoping to have her on Entresto but [facility] couldn't get it so had asked me to start with Losartan (d/w Dr Kirke Corin) for now.   HLD (hyperlipidemia): Continue Lipitor   Type II diabetes mellitus with renal manifestations [facility]): Recent A1c 11.3, poorly controlled.   - requested her to take her meds as prescribed    Chronic kidney disease (CKD), stage II (mild): creatinine at baseline on the day of D/C   Iron deficiency anemia: Stable hemoglobin, Continue iron   Polysubstance abuse and medication noncompliance UDS positive for cocaine and opiate.  Counseled. Patient's medication scripts  sent to med mg[facility]  clinic and counseled to make sure she takes it as prescribed. She was also instructed to f/up at CHF clinic, her PCP & cardiology.   Stage III obesity. Estimated body mass index is 36.07 kg/m as calculated from the following: This complicates overall prognosis.   DISCHARGE CONDITIONS:  stable CONSULTS OBTAINED:  Treatment Team:  Nelva Bush,  MD DRUG ALLERGIES:   Allergies  Allergen Reactions  . No [facility] Food Allergies Rash and Other (See Comments)    Lemons   DISCHARGE MEDICATIONS:   Allergies as of 12/08/2020       Reactions   No [facility]alt[employer] Food Allergies Rash, Other (See Comments)   Lemons        Medication List     STOP taking these medications    BD Veo Insulin Syringe U/F 31G X 15/64" 0.3 ML Misc Generic drug: Insulin Syringe-Needle U-100   calcium carbonate 600 MG Tabs tablet Commonly known as: OS-CAL   magnesium oxide 400 (240 Mg) MG tablet Commonly known as: MAG-OX   metolazone 5 MG tablet Commonly known as: ZAROXOLYN   potassium chloride SA 20 MEQ tablet Commonly known as: KLOR-CON   ReliOn Mini Pen Needles 31G X 6 MM Misc Generic drug: Insulin Pen Needle       TAKE these medications    Aspirin Adult Low Strength 81 MG EC tablet Generic drug: aspirin Take 1 tablet (81 mg total) by mouth once daily.   atorvastatin 20 MG tablet Commonly known as: LIPITOR TAKE ONE TABLET BY MOUTH EVERY DAY.   blood glucose meter kit and supplies Kit Dispense based on patient and insurance preference. Use up to four times daily as directed. (FOR ICD-9 250.00, 250.01).   carvedilol 6.25 MG tablet Commonly known as: COREG Take 1 tablet (6.25 mg total) by mouth 2 (two) times daily with a meal. What changed:  how much to take how to take this when to take this   Farxiga 10 MG Tabs tablet Generic drug: dapagliflozin propanediol Take 1 tablet (10 mg total) by mouth once daily.   Ferrex 150 150 MG capsule Generic drug: iron polysaccharides Take 1 capsule (150 mg total) by mouth once daily. What changed: how much to take   losartan 50 MG tablet Commonly known as: Cozaar Take 1 tablet (50 mg total) by mouth once daily. What changed:  medication strength how much to take   NovoLOG Mix 70/30 (70-30) 100 UNIT/ML injection Generic drug: insulin aspart protamine- aspart Inject 0.1 mLs (10  Units total) into the skin 2 (two) times daily with a meal. What changed:  how much to take how to take this when to take this   spironolactone 25 MG tablet Commonly known as: ALDACTONE Take 1 tablet (25 mg total) by mouth once daily. What changed: how much to take   torsemide 20 MG tablet Commonly known as: DEMADEX Take 2 tablets (40 mg total) by mouth once daily. What changed:  medication strength when to take this       DISCHARGE INSTRUCTIONS:   DIET:  Cardiac diet DISCHARGE CONDITION:  Good ACTIVITY:  Activity as tolerated OXYGEN:  Home Oxygen: No.  Oxygen Delivery: room air DISCHARGE LOCATION:  home   If you experience worsening of your admission symptoms, develop shortness of breath, life threatening emergency, suicidal or homicidal thoughts you must seek medical attention immediately by calling 911 or calling your MD immediately  if symptoms less severe.  You Must read complete instructions/literature along with all the possible adverse reactions/side  effects for all the Medicines you take and that have been prescribed to you. Take any new Medicines after you have completely understood and accpet all the possible adverse reactions/side effects.   Please note  You were cared for by a hospitalist during your hospital stay. If you have any questions about your discharge medications or the care you received while you were in the hospital after you are discharged, you can call the unit and asked to speak with the hospitalist on call if the hospitalist that took care of you is not available. Once you are discharged, your primary care physician will handle any further medical issues. Please note that NO REFILLS for any discharge medications will be authorized once you are discharged, as it is imperative that you return to your primary care physician (or establish a relationship with a primary care physician if you do not have one) for your aftercare needs so that they can  reassess your need for medications and monitor your lab values.    On the day of Discharge:  VITAL SIGNS:  Blood pressure 116/84, pulse 93, temperature 98.2 F (36.8 C), temperature source Oral, resp. rate 18, height $RemoveBe'5\' 7"'QkFCPxTJa$  (1.702 m), weight 101.6 kg, SpO2 97 %. PHYSICAL EXAMINATION:  GENERAL:  35 y.o.-year-old patient lying in the bed with no acute distress.  EYES: Pupils equal, round, reactive to light and accommodation. No scleral icterus. Extraocular muscles intact.  HEENT: Head atraumatic, normocephalic. Oropharynx and nasopharynx clear.  NECK:  Supple, no jugular venous distention. No thyroid enlargement, no tenderness.  LUNGS: Normal breath sounds bilaterally, no wheezing, rales,rhonchi or crepitation. No use of accessory muscles of respiration.  CARDIOVASCULAR: S1, S2 normal. No murmurs, rubs, or gallops.  ABDOMEN: Soft, non-tender, non-distended. Bowel sounds present. No organomegaly or mass.  EXTREMITIES: No pedal edema, cyanosis, or clubbing.  NEUROLOGIC: Cranial nerves II through XII are intact. Muscle strength 5/5 in all extremities. Sensation intact. Gait not checked.  PSYCHIATRIC: The patient is alert and oriented x 3.  SKIN: No obvious rash, lesion, or ulcer.  DATA REVIEW:   CBC Recent Labs  Lab 12/08/20 0526  WBC 6.1  HGB 9.3*  HCT 31.0*  PLT 426*    Chemistries  Recent Labs  Lab 12/08/20 0526  NA 135  K 3.9  CL 88*  CO2 37*  GLUCOSE 175*  BUN 26*  CREATININE 1.10*  CALCIUM 8.3*     Outpatient follow-up  Follow-up Information     [name], MD. Schedule an appointment as soon as possible for a visit on 01/01/2021.   Specialties: Cardiology, Radiology Why: [facility] Discharge F/UP.Marland Kitchen@ 11:20am Contact information: [place] [phone]                 30 Day Unplanned Readmission Risk Score    Flowsheet Row ED to Hosp-Admission (Discharged) from 11/27/2020 in [facility]   30 Day Unplanned Readmission Risk Score (%) 32.33 Filed at 12/08/2020 1200       This score is the patient's risk of an unplanned readmission within 30 days of being discharged (0 -100%). The score is based on dignosis, age, lab data, medications, orders, and past utilization.   Low:  0-14.9   Medium: 15-21.9   High: 22-29.9   Extreme: 30 and above          Management plans discussed with the patient, family and they are in agreement.  CODE STATUS: Prior   TOTAL TIME TAKING CARE  OF THIS PATIENT: 45 minutes.    Max Sane M.D on 12/10/2020 at 7:59 PM  [facility]   CC: Primary care physician; Patient, No Pcp Per (Inactive)   Note: This dictation was prepared with Dragon dictation along with smaller phrase technology. Any transcriptional errors that result from this process are unintentional.

## 2020-12-10 NOTE — Patient Instructions (Signed)
Continue weighing daily and call for an overnight weight gain of > 2 pounds or a weekly weight gain of >5 pounds. 

## 2020-12-11 ENCOUNTER — Other Ambulatory Visit: Payer: Self-pay

## 2020-12-11 ENCOUNTER — Other Ambulatory Visit: Payer: Self-pay | Admitting: Family

## 2020-12-11 DIAGNOSIS — I5022 Chronic systolic (congestive) heart failure: Secondary | ICD-10-CM

## 2020-12-11 MED ORDER — SPIRONOLACTONE 25 MG PO TABS
25.0000 mg | ORAL_TABLET | Freq: Every day | ORAL | 3 refills | Status: DC
  Filled 2020-12-11 – 2021-01-14 (×2): qty 90, 90d supply, fill #0

## 2020-12-11 MED ORDER — DAPAGLIFLOZIN PROPANEDIOL 10 MG PO TABS
10.0000 mg | ORAL_TABLET | Freq: Every day | ORAL | 3 refills | Status: DC
  Filled 2020-12-11: qty 90, 90d supply, fill #0
  Filled 2021-01-14: qty 30, 30d supply, fill #0

## 2020-12-11 MED ORDER — CARVEDILOL 6.25 MG PO TABS
6.2500 mg | ORAL_TABLET | Freq: Two times a day (BID) | ORAL | 3 refills | Status: DC
Start: 2020-12-11 — End: 2022-08-04
  Filled 2020-12-11 – 2021-01-14 (×2): qty 180, 90d supply, fill #0

## 2020-12-11 MED ORDER — LOSARTAN POTASSIUM 50 MG PO TABS
50.0000 mg | ORAL_TABLET | Freq: Every day | ORAL | 3 refills | Status: DC
  Filled 2020-12-11 – 2021-01-14 (×2): qty 90, 90d supply, fill #0

## 2020-12-11 MED ORDER — TORSEMIDE 20 MG PO TABS
40.0000 mg | ORAL_TABLET | Freq: Every day | ORAL | 3 refills | Status: DC
  Filled 2020-12-11 – 2021-01-14 (×2): qty 180, 90d supply, fill #0

## 2020-12-11 NOTE — Progress Notes (Signed)
[facility] to [facility] for: Carvedilol Farxiga Losartan Spironolactone torsemide

## 2020-12-12 ENCOUNTER — Telehealth: Payer: Self-pay

## 2020-12-12 NOTE — Telephone Encounter (Signed)
Called patient and left a VM to call back.  Dr. Azucena Cecil wants to follow up with her post recent hospital admission (Discharged 12/08/20). Will hold an appointment for Tues 12/16/20 at 1000.

## 2020-12-15 ENCOUNTER — Telehealth: Payer: Self-pay | Admitting: Pharmacy Technician

## 2020-12-15 NOTE — Telephone Encounter (Signed)
Received updated proof of income.  Patient eligible to receive medication assistance at [facility] until time for re-certification in 2023, and as long as eligibility requirements continue to be met.  Molly Howard J. Molly Howard Care Manager [facility]  

## 2020-12-15 NOTE — Telephone Encounter (Signed)
Called and left a VM for patient to call back. 

## 2020-12-15 NOTE — Telephone Encounter (Signed)
Called patient and switched her appointment to 8:40 on 12/18/20. Patient appreciative for the appointment

## 2020-12-16 ENCOUNTER — Ambulatory Visit: Payer: Self-pay | Admitting: Cardiology

## 2020-12-16 ENCOUNTER — Telehealth (HOSPITAL_COMMUNITY): Payer: Self-pay

## 2020-12-16 NOTE — Telephone Encounter (Signed)
Today contacted Anette to see if interested in [employer].  She advised yes after telling her about it.  I met with [place] and assisted her with getting her paperwork turned into medication management.  She advised me she has an 8:40 appt on Thursday and has not set up transportation. I advised her I can do that, she was appreciative.  [facility] transportation, set it up and advised her they will call her tomorrow with a time to be ready.  She also asked if I could check on her disability.  Will try to get a number for her to call.  Will make a home visit next week to check her meds.  She advised her weight was 222 lbs this morning and she states that her weight keeps going down.  She states she feels well.  Will visit for heart failure, diet and medication management.   [place] [phone]

## 2020-12-17 ENCOUNTER — Other Ambulatory Visit: Payer: Self-pay

## 2020-12-18 ENCOUNTER — Encounter: Payer: Self-pay | Admitting: Cardiology

## 2020-12-18 ENCOUNTER — Other Ambulatory Visit: Payer: Self-pay

## 2020-12-18 ENCOUNTER — Ambulatory Visit (INDEPENDENT_AMBULATORY_CARE_PROVIDER_SITE_OTHER): Payer: Self-pay | Admitting: Cardiology

## 2020-12-18 VITALS — BP 100/76 | HR 91 | Ht 67.0 in | Wt 222.0 lb

## 2020-12-18 DIAGNOSIS — I428 Other cardiomyopathies: Secondary | ICD-10-CM

## 2020-12-18 DIAGNOSIS — E7800 Pure hypercholesterolemia, unspecified: Secondary | ICD-10-CM

## 2020-12-18 NOTE — Patient Instructions (Signed)
Medication Instructions:  Your physician recommends that you continue on your current medications as directed. Please refer to the Current Medication list given to you today.  *If you need a refill on your cardiac medications before your next appointment, please call your pharmacy*   Lab Work: None ordered If you have labs (blood work) drawn today and your tests are completely normal, you will receive your results only by: MyChart Message (if you have MyChart) OR A paper copy in the mail If you have any lab test that is abnormal or we need to change your treatment, we will call you to review the results.   Testing/Procedures:  Your physician has requested that you have an echocardiogram in 3 months. Echocardiography is a painless test that uses sound waves to create images of your heart. It provides your doctor with information about the size and shape of your heart and how well your heart's chambers and valves are working. This procedure takes approximately one hour. There are no restrictions for this procedure.    Follow-Up: At [facility], you and your health needs are our priority.  As part of our continuing mission to provide you with exceptional heart care, we have created designated Provider Care Teams.  These Care Teams include your primary Cardiologist (physician) and Advanced Practice Providers (APPs -  Physician Assistants and Nurse Practitioners) who all work together to provide you with the care you need, when you need it.  We recommend signing up for the patient portal called "MyChart".  Sign up information is provided on this After Visit Summary.  MyChart is used to connect with patients for Virtual Visits (Telemedicine).  Patients are able to view lab/test results, encounter notes, upcoming appointments, etc.  Non-urgent messages can be sent to your provider as well.   To learn more about what you can do with MyChart, go to [email or web address].    Your next  appointment:   3 month(s)  The format for your next appointment:   In Person  Provider:   Brian Agbor-Etang, MD   Other Instructions   

## 2020-12-18 NOTE — Progress Notes (Signed)
Cardiology Office Note:    Date:  12/18/2020   ID:  Molly Howard, DOB January 22, 1986, MRN [id number]  PCP:  Patient, No Pcp Per (Inactive)   Molly Howard  Cardiologist:  Molly Sable, MD  Advanced Practice Provider:  No care team member to display Electrophysiologist:  None       Referring MD: No ref. provider found   Chief Complaint  Patient presents with   Other    Hospital follow up - Meds reviewed verbally with patient.     History of Present Illness:    Molly Howard is a 35 y.o. female with a hx of NICM EF 20-25%, last EF 30-35%, hyperlipidemia, diabetes, former smoker x 34yr, polysubstance abuse, obesity presenting for follow-up.    Patient being seen for nonischemic cardiomyopathy.  Recently seen in the hospital again due to volume overload.  UDS was positive for cocaine.  She was adequately diuresed with IV Lasix.  Volume overload was due to medication noncompliance.  She endorsed taking her medications as prescribed.  She feels well, her weight averages to 222 to 224 pounds since discharge from the hospital.   Prior notes Echo 10/2020, EF 30 to 35%. Echo 05/20/2020 EF 20 to 25% Echo 10/2019 EF 35 to 40% Lexiscan Myoview 10/2019 no evidence for ischemia, EF 30% Insurance/cost issues preventing use of Entresto, SGLT2i Mother has a history of heart failure, has a device placed not sure if pacemaker or ICD.  Past Medical History:  Diagnosis Date   Acid reflux    Chronic HFrEF (heart failure with reduced ejection fraction) ([place]    a. 10/2019 Echo: EF 35-40%, GrII DD; b. 05/2020 Echo: EF 20-25%, glob HK; c. 10/2020 Echo: EF 30-35%, glob HK. GrII DD, Mildly red RV fxn. Mod TR.   CKD (chronic kidney disease), stage II    Diabetes mellitus ([place]    H/O medication noncompliance    Hypertension    Microcytic anemia    NICM (nonischemic cardiomyopathy) ([place]    a. 10/2019 Echo: EF 35-40%; b. 10/2019 MV: No ischemia. Small apical defect-->breast  attenuation; c. 05/2020 Echo: EF 20-25%; d. 10/2020 Echo: EF 30-35%, glob HK. GrII DD, Mildly red RV fxn. Mod TR.   Obesity    Polysubstance abuse ([facility]     Past Surgical History:  Procedure Laterality Date   CHOLECYSTECTOMY     HERNIA REPAIR      Current Medications: Current Meds  Medication Sig   aspirin 81 MG EC tablet Take 1 tablet (81 mg total) by mouth once daily.   atorvastatin (LIPITOR) 20 MG tablet TAKE ONE TABLET BY MOUTH EVERY DAY.   blood glucose meter kit and supplies KIT Dispense based on patient and insurance preference. Use up to four times daily as directed. (FOR ICD-9 250.00, 250.01).   carvedilol (COREG) 6.25 MG tablet Take 1 tablet (6.25 mg total) by mouth 2 (two) times daily with a meal.   dapagliflozin propanediol (FARXIGA) 10 MG TABS tablet Take 1 tablet (10 mg total) by mouth once daily.   insulin aspart protamine- aspart (NOVOLOG MIX 70/30) (70-30) 100 UNIT/ML injection Inject 0.1 mLs (10 Units total) into the skin 2 (two) times daily with a meal.   iron polysaccharides (NIFEREX) 150 MG capsule Take 1 capsule (150 mg total) by mouth once daily.   losartan (COZAAR) 50 MG tablet Take 1 tablet (50 mg total) by mouth once daily.   spironolactone (ALDACTONE) 25 MG tablet Take 1 tablet (25 mg total)  by mouth once daily.   torsemide (DEMADEX) 20 MG tablet Take 2 tablets (40 mg total) by mouth once daily.     Allergies:   No healthtouch food allergies   Social History   Socioeconomic History   Marital status: Single    Spouse name: Not on file   Number of children: Not on file   Years of education: Not on file   Highest education level: Not on file  Occupational History   Not on file  Tobacco Use   Smoking status: Former    Packs/day: 0.50    Types: Cigarettes   Smokeless tobacco: Never  Vaping Use   Vaping Use: Never used  Substance and Sexual Activity   Alcohol use: Not Currently    Comment: ~ 4 shots/day on weekends only   Drug use: Not Currently     Types: Cocaine    Comment: Last day of use was prior to June 2021 hosp.visit.    Sexual activity: Not on file  Other Topics Concern   Not on file  Social History Narrative   Not on file   Social Determinants of Health   Financial Resource Strain: High Risk   Difficulty of Paying Living Expenses: Hard  Food Insecurity: Food Insecurity Present   Worried About Running Out of Food in the Last Year: Often true   Arboriculturist in the Last Year: Often true  Transportation Needs: Public librarian (Medical): Yes   Lack of Transportation (Non-Medical): Yes  Physical Activity: Not on file  Stress: Not on file  Social Connections: Not on file     Family History: The patient's family history includes Diabetes in her father and mother; Heart failure in her mother; Hypertension in her father.  ROS:   Please see the history of present illness.     All other systems reviewed and are negative.  EKGs/Labs/Other Studies Reviewed:    The following studies were reviewed today:   EKG:  EKG is  ordered today.  The ekg ordered today demonstrates normal sinus rhythm, low voltage QRS.  Recent Labs: 05/21/2020: ALT 13 11/27/2020: B Natriuretic Peptide 1,043.9 11/28/2020: Magnesium 1.6 12/08/2020: BUN 26; Creatinine, Ser 1.10; Hemoglobin 9.3; Platelets 426; Potassium 3.9; Sodium 135  Recent Lipid Panel    Component Value Date/Time   CHOL 212 (H) 05/21/2020 0643   TRIG 121 05/21/2020 0643   HDL 35 (L) 05/21/2020 0643   CHOLHDL 6.1 05/21/2020 0643   VLDL 24 05/21/2020 0643   LDLCALC 153 (H) 05/21/2020 0643     Risk Assessment/Calculations:      Physical Exam:    VS:  BP 100/76 (BP Location: Left Arm, Patient Position: Sitting, Cuff Size: Normal)   Pulse 91   Ht '5\' 7"'  (1.702 m)   Wt 222 lb (100.7 kg)   SpO2 94%   BMI 34.77 kg/m     Wt Readings from Last 3 Encounters:  12/18/20 222 lb (100.7 kg)  12/10/20 226 lb (102.5 kg)  12/08/20 224 lb (101.6  kg)     GEN:  Well nourished, well developed in no acute distress HEENT: Normal NECK: No JVD; No carotid bruits LYMPHATICS: No lymphadenopathy CARDIAC: RRR, no murmurs, rubs, gallops RESPIRATORY: Decreased breath sounds at bases ABDOMEN: Soft, non-tender, distended MUSCULOSKELETAL: no edema; No deformity   SKIN: Warm and dry NEUROLOGIC:  Alert and oriented x 3 PSYCHIATRIC:  Normal affect   ASSESSMENT:    1. NICM (nonischemic cardiomyopathy) ([place])  2. Pure hypercholesterolemia    PLAN:    In order of problems listed above:  Nonischemic cardiomyopathy, last EF 10/2020 was 30-35%. Lexiscan Myoview with no evidence for ischemia. Patient describes NYHA class II symptoms although improving.  Continue Coreg 6.25 mg twice daily, losartan 50 mg daily, Aldactone 25 mg daily, torsemide 40 mg daily.  Farxiga 10 mg daily.  Plan for repeat echocardiogram in 3 months.   Hyperlipidemia, continue Lipitor 20 mg  Follow-up in 3 months    Medication Adjustments/Labs and Tests Ordered: Current medicines are reviewed at length with the patient today.  Concerns regarding medicines are outlined above.  Orders Placed This Encounter  Procedures   EKG 12-Lead   ECHOCARDIOGRAM COMPLETE     No orders of the defined types were placed in this encounter.   Patient Instructions  Medication Instructions:  Your physician recommends that you continue on your current medications as directed. Please refer to the Current Medication list given to you today.  *If you need a refill on your cardiac medications before your next appointment, please call your pharmacy*   Lab Work: None ordered If you have labs (blood work) drawn today and your tests are completely normal, you will receive your results only by: [place] (if you have MyChart) OR A paper copy in the mail If you have any lab test that is abnormal or we need to change your treatment, we will call you to review the  results.   Testing/Procedures:  Your physician has requested that you have an echocardiogram in 3 months. Echocardiography is a painless test that uses sound waves to create images of your heart. It provides your doctor with information about the size and shape of your heart and how well your heart's chambers and valves are working. This procedure takes approximately one hour. There are no restrictions for this procedure.    Follow-Up: At [facility], you and your health needs are our priority.  As part of our continuing mission to provide you with exceptional heart care, we have created designated Provider Care Teams.  These Care Teams include your primary Cardiologist (physician) and Advanced Practice Providers (APPs -  Physician Assistants and Nurse Practitioners) who all work together to provide you with the care you need, when you need it.  We recommend signing up for the patient portal called "MyChart".  Sign up information is provided on this After Visit Summary.  MyChart is used to connect with patients for Virtual Visits (Telemedicine).  Patients are able to view lab/test results, encounter notes, upcoming appointments, etc.  Non-urgent messages can be sent to your provider as well.   To learn more about what you can do with MyChart, go to [email or web address].    Your next appointment:   3 month(s)  The format for your next appointment:   In Person  Provider:   [name], MD   Other Instructions    Signed, Molly Sable, MD  12/18/2020 12:39 PM    [place]

## 2020-12-19 ENCOUNTER — Other Ambulatory Visit: Payer: Self-pay

## 2020-12-19 ENCOUNTER — Telehealth: Payer: Self-pay | Admitting: Pharmacist

## 2020-12-19 NOTE — Telephone Encounter (Signed)
12/19/2020 12:35:23 PM - Novolog mix 70/30 & farxiga to pt & dr  -- Rhetta Mura - Friday, December 19, 2020 12:33 PM --Received pharmacy printout for Molly Howard 10mg  Take one tablet by mouth daily & Novolog Mix 70/30 Inject 10 units into the skin two times daily with a meal. Printed applications mailing to patient to sign & return, also sending Interoffice to provider to sign & return.

## 2020-12-22 ENCOUNTER — Other Ambulatory Visit: Payer: Self-pay

## 2020-12-22 ENCOUNTER — Telehealth: Payer: Self-pay | Admitting: Cardiology

## 2020-12-22 ENCOUNTER — Telehealth: Payer: Self-pay | Admitting: Pharmacist

## 2020-12-22 MED ORDER — ENTRESTO 49-51 MG PO TABS
1.0000 | ORAL_TABLET | Freq: Two times a day (BID) | ORAL | 11 refills | Status: DC
  Filled 2020-12-22: qty 60, 30d supply, fill #0

## 2020-12-22 NOTE — Telephone Encounter (Signed)
12/22/2020 10:40:21 AM - Sherryll Burger 49/51 forms to pt & dr  -- Rhetta Mura - Monday, December 22, 2020 10:38 AM --Madelaine Bhat brought me a printout for Entresto 49/51 Take 1 tablet by mouth 2 times daily. Mailing patient her portion to sign & return, also sending Interoffice to provider-[facility] to sign and return by Interoffice.

## 2020-12-22 NOTE — Telephone Encounter (Signed)
FYI per patient pharmacy :  1. Name of Medication: entresto   2. How are you currently taking this medication (dosage and times per day)? Previously prescribed now taking losartan   3. Are you having a reaction (difficulty breathing--STAT)? N/a   4. What is your medication issue?   Per medication management pharmacist Adam patient now taking losartan instead of entresto as it is not available there  due to cost .  Adam reports patient seems to be doing well taking losartan but if provider would like patient to take entresto this can be arranged by sending over a new rx and they can submit a patient assistance application through the drug company .

## 2020-12-22 NOTE — Telephone Encounter (Signed)
Called medication management and spoke with Adam. Informed him that Dr. Azucena Cecil would prefer that the patient be on Entresto 49-51. If they could try and get that approved and when/if they do, to then stop Losartan and start the [facility].  Adam stated that they will start the patient assistance application and follow through with switching her to [facility] if/when approved.

## 2020-12-24 ENCOUNTER — Other Ambulatory Visit: Payer: Self-pay

## 2020-12-29 ENCOUNTER — Other Ambulatory Visit (HOSPITAL_COMMUNITY): Payer: Self-pay

## 2020-12-29 ENCOUNTER — Other Ambulatory Visit: Payer: Self-pay | Admitting: Family

## 2020-12-29 ENCOUNTER — Other Ambulatory Visit: Payer: Self-pay

## 2020-12-29 MED ORDER — ATORVASTATIN CALCIUM 20 MG PO TABS
ORAL_TABLET | Freq: Every day | ORAL | 3 refills | Status: DC
  Filled 2020-12-29: qty 90, fill #0
  Filled 2021-01-14: qty 90, 90d supply, fill #0

## 2020-12-29 NOTE — Progress Notes (Signed)
Atorvastatin sent to [facility]

## 2020-12-29 NOTE — Progress Notes (Signed)
Had a home visit with Molly Howard.  She states doing well.  She took her last Entresto today.  She has not called Medication management for refills.  She states she has no ride over there.  She is down to about a week with all meds.  Contacted Tina with HF clinic to get refill on atorvastatin.  Contacted [place] transportation and they will pick her up at 10am tomorrow to take her to medication management.  Made her aware and she understands.  Advised her any problems getting her meds to call me prior to leaving there.  She has not [facility], they are closed today and will call to get her appt tomorrow.  Will schedule with transportation for her a ride when scheduled.  She states did not weight today, gave her forms to log her weights and sugars.  She states she has been trying to watch her high sodium foods but she is short of food.  Advised her of a food pantry on Wednesday, she states dont have transportation on Wednesday, will see if I can find something for her.  She denies any problems today such as chest pain, increased shortness of breath, headaches or dizziness.  Will continue to visit for heart failure, diet and medication compliance.   Earmon Phoenix  EMT-Paramedic [phone]

## 2020-12-30 ENCOUNTER — Encounter (HOSPITAL_COMMUNITY): Payer: Self-pay

## 2020-12-30 ENCOUNTER — Other Ambulatory Visit: Payer: Self-pay

## 2020-12-30 ENCOUNTER — Telehealth: Payer: Self-pay | Admitting: Pharmacist

## 2020-12-30 NOTE — Progress Notes (Signed)
I had scheduled Molly Howard to go to Medication management to get her meds.  After she arrived they informed her paperwork has not been completed for [facility] and the other meds are not ready til the 7th.  She went back home.  She has took her last losartan yesterday and medication management will not fill because they want her on Entresto.  Spoke with Molly Howard with HF clinic about what to do, she is giving 2 bottles of Entresto samples and spoke with [facility] [place] and they have 1 bottle, should get her through til the paperwork is finished for her to get the [facility] through medication management.  Schedule transported to HF clinic for tomorrow to pick up meds.  She will only be missing 1 day of losartan til starting the [facility].  Made Molly Howard aware she will be taking twice a day instead of losartan once a day.  She appeared to understand.   Earmon Phoenix [place] EMT-Paramedic [phone]

## 2020-12-30 NOTE — Telephone Encounter (Signed)
12/30/2020 11:51:23 AM - Sherryll Burger & Novolog pending  -- Rhetta Mura - Tuesday, December 30, 2020 11:49 AM --I have received the signed patient portion of applications for Clifton Custard & Novolog Mix. Holding for provider to return their portion, sent to provider 12/19/20 by Interoffice to [facility] ATTN: Debbe Odea.

## 2021-01-01 ENCOUNTER — Encounter (HOSPITAL_COMMUNITY): Payer: Self-pay

## 2021-01-01 ENCOUNTER — Telehealth: Payer: Self-pay | Admitting: Pharmacist

## 2021-01-01 ENCOUNTER — Ambulatory Visit: Payer: Self-pay | Admitting: Cardiology

## 2021-01-01 NOTE — Telephone Encounter (Signed)
01/01/2021 12:09:04 PM - Novolog mix 70/30 faxed to [employer]o  -- Rhetta Mura - Thursday, January 01, 2021 12:08 PM --[employer] application for [employer] Mix 70/30 Inject 10 units two times daily (max daily 20 units) # 2 boxes. 01/01/2021 12:08:02 PM - Marcelline Deist faxed to [place]  -- Rhetta Mura - Thursday, January 01, 2021 12:07 PM --Faxed [place] application for Farxiga 10mg  Take 1 daily. 01/01/2021 12:07:25 PM - 03/03/2021 49/51 faxed to [employer]artis  -- Nelva Nay - Thursday, January 01, 2021 12:06 PM --Faxed [employer] application for January 03, 2021 49/51mg  Take 1 tablet two (2) times a day.

## 2021-01-01 NOTE — Progress Notes (Signed)
She did not go to HF clinic or cardiology to pick up samples of Entresto yesterday, she states felt bad and did not want to get out of bed.  Spoke to her today and she advised she is Contractor for tomorrow and will get them tomorrow.   Earmon Phoenix [place] EMT-Paramedic [phone]

## 2021-01-08 ENCOUNTER — Encounter (HOSPITAL_COMMUNITY): Payer: Self-pay

## 2021-01-08 NOTE — Progress Notes (Signed)
Advised Molly Howard that she needs to go on a Thursday evening and turn in rest of [facility] to [facility] to be able to get an appt.  She needs tax documents and anything that shows income and residency.  She appeared to understand, advised her once she turns in paperwork I can make her an appt and will get transportation to take her to her appts.  She states she can get a family member to take her after 4 on Thursday.  She states started the [place] and feeling fine.  She denies any problems today.   Earmon Phoenix [place] EMT-Paramedic [phone]

## 2021-01-12 ENCOUNTER — Telehealth (HOSPITAL_COMMUNITY): Payer: Self-pay

## 2021-01-12 NOTE — Telephone Encounter (Signed)
Molly Howard contacted me and advised she has 2 days of meds left. She states needs to go to Medication management.  Contacted transportation for tomorrow and she is aware to be ready at 10:30.    Earmon Phoenix [place] EMT-Paramedic [phone]

## 2021-01-14 ENCOUNTER — Telehealth (HOSPITAL_COMMUNITY): Payer: Self-pay

## 2021-01-14 ENCOUNTER — Other Ambulatory Visit: Payer: Self-pay

## 2021-01-14 NOTE — Telephone Encounter (Signed)
Contacted Molly Howard due to contacted Medication management and she did not pick up her medications yesterday after scheduling [place] transportation.  She advised had things to do for her mother and she states cancelled transportation.   She states she had no meds for today but is getting a ride to pick up her meds.  Advised her they close at 4:30.  Will check tomorrow to see if she did get them.  She denies having any problems.   Earmon Phoenix [place] EMT-Paramedic [phone]

## 2021-01-15 ENCOUNTER — Other Ambulatory Visit: Payer: Self-pay

## 2021-01-21 ENCOUNTER — Encounter (HOSPITAL_COMMUNITY): Payer: Self-pay

## 2021-01-21 NOTE — Progress Notes (Signed)
Molly Howard contacted me yesterday advising she had no food in the home or gas to put in the car.  Her and Mom lives together.  She states she is borrowing her sisters car, unable to go to food banks because no gas.  Furnished her with several grocery items and advised her she will need to find a way to food bank for this coming Friday.  Her mother and her gets food stamps and they both say they are out this month.  Advised her we could not give her gas money or a card.   Earmon Phoenix [place] EMT-Paramedic [phone]

## 2021-01-28 ENCOUNTER — Other Ambulatory Visit (HOSPITAL_COMMUNITY): Payer: Self-pay

## 2021-01-29 NOTE — Progress Notes (Signed)
Had a home visit with [place].  She states feeling good. . She had been outside walking around,  She states just had a her hair done.  She has received some mail stating she has medicaid now and she has appts for her disablility.  Assisted her in making arrangement with her insurance for rides to these appts.  Gave her the number to call to get an insurance card.  Advised her that since she is approved for insurance she will no longer be able to get meds from medication management.  Her copays will be $4.oo a medication.  She has no income at this time.  She lives with her Mom that is also disabled.  She denies any complaints today.  She [facility] not [facility] but states since she has insurance now she would like appt at a PCP.  Will try to get her in with [facility].  Will continue to visit for heart failure.   Earmon Phoenix [place] EMT-Paramedic [phone]

## 2021-02-04 ENCOUNTER — Other Ambulatory Visit: Payer: Self-pay

## 2021-02-05 ENCOUNTER — Other Ambulatory Visit: Payer: Self-pay

## 2021-02-06 ENCOUNTER — Telehealth: Payer: Self-pay | Admitting: Pharmacy Technician

## 2021-02-06 NOTE — Telephone Encounter (Signed)
Patient has Medicaid with prescription drug coverage.  No longer meets the eligibility criteria for [facility].  Patient stated that she is still waiting on SS Disability approval.  Does not have any income.  Cannot afford co-pays of medications.  Spoke with Dayna Barker at the [employer].  The [facility] has agreed to pay for co-pays on patients medications through the end of 2022.  Will re-access at the beginning of 2023.  Made patient aware that [facility] would cover co-pays through the end of the year.  Asked pharmacy staff to transfer patient's prescriptions to [facility].  Sammuel Hines stated that she would take care of the transfer.  Sherilyn Dacosta Care Manager [facility]

## 2021-02-06 NOTE — Telephone Encounter (Signed)
  Marcille Blanco [place], [place]  [address]   February 06, 2021    Tehila Sokolow [address] [place], [place]  [address]  Dear Noel Gerold:  This is to inform you that you are no longer eligible to receive medication assistance at [facility].  The reason(s) are:    _____Your total gross monthly household income exceeds 250% of the Federal Poverty Level.   _____Tangible assets (savings, checking, stocks/bonds, pension, retirement, etc.) exceeds our limit  _____You are eligible to receive benefits from a Medicare Part "D" plan __X__You have prescription insurance with Medicaid _____You are not an [facility] resident _____Failure to provide all requested documentation (proof of income information for 2022., and/or Patient Intake Application, DOH Attestation, Contract, etc).    We regret that we are unable to help you at this time.  If your prescription coverage is terminated, please contact [facility], so that we may reassess your eligibility for our program.  If you have questions, we may be contacted at [phone].  Thank you,  [facility]

## 2021-02-11 ENCOUNTER — Telehealth (HOSPITAL_COMMUNITY): Payer: Self-pay

## 2021-02-11 NOTE — Telephone Encounter (Signed)
Contacted Molly Howard to remind her of HF clinic appt tomorrow, she is aware and aware transportation will pick her up.  Also discussed with her since she now has medicaid she will no longer be able to get her meds at medication management, advised her they have switched her med to [place] and foundation will pay her copay til the end of year.  She will have to start paying $4.oo a prescription in January.  She appears to understand.  She has been making her appts for her disability.  She states doing good and has been feeling good lately.  Will continue to visit for heart failure.   Earmon Phoenix [place] EMT-Paramedic [phone]

## 2021-02-11 NOTE — Progress Notes (Signed)
Patient ID: Molly Howard, female    DOB: 01-10-1986, 35 y.o.   MRN: [id number]  HPI  Molly Howard is a 35 y/o female with a history of DM, HTN, previous drug use and chronic heart failure.   Echo report from 10/11/20 reviewed and showed an EF of 30-35% along with moderate TR.   Admitted 11/27/20 due to acute on chronic HF with orthopnea and 40 pound weight gain. Initially given IV lasix. Given IV hydralazine for HTN. Cardiology consult obtained. IV meds transitioned to oral medications. UDS + for cocaine and opiates. Discharged after 11 days. Admitted 10/10/20 due to acute on chronic HF. Cardiology consult obtained. Initially given IV lasix with transition to oral diuretics. UDS + for cocaine. Discharged after 8 days after she insisted on leaving.   She presents today for a follow-up visit with a chief complaint of minimal shortness of breath upon moderate exertion. She describes this as chronic in nature having been present for several months although she says that her breathing and energy level have both improved. She has associated fatigue, palpitations, light-headedness, chronic back pain and continued weight loss along with this. She denies any difficulty sleeping, abdominal distention, pedal edema, chest pain or cough.   Now has medicaid but still waiting on disability process so has no way of affording her copays and now can't [facility]. Note from them on 02/06/21 says that the [facility] has agreed to pay copays of her medications for the remainder of this year with a reassessment of the situating beginning of 2023.   Upon review of medication bottles, she was taking both losartan and entresto.   Past Medical History:  Diagnosis Date   Acid reflux    Chronic HFrEF (heart failure with reduced ejection fraction) ([place])    a. 10/2019 Echo: EF 35-40%, GrII DD; b. 05/2020 Echo: EF 20-25%, glob HK; c. 10/2020 Echo: EF 30-35%, glob HK. GrII DD, Mildly red RV fxn. Mod  TR.   CKD (chronic kidney disease), stage II    Diabetes mellitus ([place])    H/O medication noncompliance    Hypertension    Microcytic anemia    NICM (nonischemic cardiomyopathy) ([place])    a. 10/2019 Echo: EF 35-40%; b. 10/2019 MV: No ischemia. Small apical defect-->breast attenuation; c. 05/2020 Echo: EF 20-25%; d. 10/2020 Echo: EF 30-35%, glob HK. GrII DD, Mildly red RV fxn. Mod TR.   Obesity    Polysubstance abuse ([place])    Past Surgical History:  Procedure Laterality Date   CHOLECYSTECTOMY     HERNIA REPAIR     Family History  Problem Relation Age of Onset   Heart failure Mother        a. onset in her 21s   Diabetes Mother    Hypertension Father    Diabetes Father    Social History   Tobacco Use   Smoking status: Former    Packs/day: 0.50    Types: Cigarettes   Smokeless tobacco: Never  Substance Use Topics   Alcohol use: Not Currently    Comment: ~ 4 shots/day on weekends only   Allergies  Allergen Reactions   No Healthtouch Food Allergies Rash and Other (See Comments)    Lemons   Prior to Admission medications   Medication Sig Start Date End Date Taking? Authorizing Provider  atorvastatin (LIPITOR) 20 MG tablet TAKE ONE TABLET BY MOUTH EVERY DAY. 12/29/20 04/15/21 Yes Darylene Price A, FNP  blood glucose meter kit and supplies  KIT Dispense based on patient and insurance preference. Use up to four times daily as directed. (FOR ICD-9 250.00, 250.01). 12/08/20  Yes Max Sane, MD  carvedilol (COREG) 6.25 MG tablet Take 1 tablet (6.25 mg total) by mouth 2 (two) times daily with a meal. 12/11/20 04/15/21 Yes Darylene Price A, FNP  dapagliflozin propanediol (FARXIGA) 10 MG TABS tablet Take 1 tablet (10 mg total) by mouth once daily. 12/11/20  Yes Greysyn Vanderberg, Otila Kluver A, FNP  insulin aspart protamine- aspart (NOVOLOG MIX 70/30) (70-30) 100 UNIT/ML injection Inject 0.1 mLs (10 Units total) into the skin 2 (two) times daily with a meal. 12/08/20  Yes Max Sane, MD  iron polysaccharides  (NIFEREX) 150 MG capsule Take 1 capsule (150 mg total) by mouth once daily. 12/08/20  Yes Max Sane, MD  sacubitril-valsartan (ENTRESTO) 49-51 MG Take 1 tablet by mouth 2 (two) times daily. 12/22/20  Yes [name], MD  spironolactone (ALDACTONE) 25 MG tablet Take 1 tablet (25 mg total) by mouth once daily. 12/11/20 04/15/21 Yes Noreta Kue, Otila Kluver A, FNP  torsemide (DEMADEX) 20 MG tablet Take 2 tablets (40 mg total) by mouth daily.  02/12/21   Darylene Price A, FNP  Potassium Chloride 40 MEQ/15ML (20%) SOLN Take 40 mEq by mouth 2 (two) times daily. Patient not taking: Reported on 10/11/2020 07/04/20 10/18/20  [name], MD   Review of Systems  Constitutional:  Positive for fatigue (improving). Negative for appetite change.  HENT:  Negative for congestion, postnasal drip and sore throat.   Eyes: Negative.   Respiratory:  Positive for shortness of breath ("much better"). Negative for cough.   Cardiovascular:  Positive for palpitations. Negative for chest pain and leg swelling.  Gastrointestinal:  Negative for abdominal distention and abdominal pain.  Endocrine: Negative.   Genitourinary: Negative.   Musculoskeletal:  Positive for arthralgias (left shoulder) and back pain.  Skin: Negative.   Allergic/Immunologic: Negative.   Neurological:  Positive for light-headedness. Negative for dizziness.  Hematological:  Negative for adenopathy. Does not bruise/bleed easily.  Psychiatric/Behavioral:  Negative for dysphoric mood and sleep disturbance (sleeping on 2 pillows). The patient is not nervous/anxious.    Vitals:   02/12/21 0934  BP: 131/87  Pulse: 83  Resp: 18  SpO2: 100%  Weight: 183 lb 2 oz (83.1 kg)  Height: '5\' 7"'  (1.702 m)   Wt Readings from Last 3 Encounters:  02/12/21 183 lb 2 oz (83.1 kg)  12/18/20 222 lb (100.7 kg)  12/10/20 226 lb (102.5 kg)   Lab Results  Component Value Date   CREATININE 1.08 (H) 02/12/2021   CREATININE 1.10 (H) 12/08/2020   CREATININE 1.21 (H)  12/07/2020    Physical Exam Vitals and nursing note reviewed.  Constitutional:      Appearance: Normal appearance.  HENT:     Head: Normocephalic and atraumatic.  Cardiovascular:     Rate and Rhythm: Normal rate and regular rhythm.  Pulmonary:     Effort: Pulmonary effort is normal. No respiratory distress.     Breath sounds: No wheezing or rales.  Abdominal:     General: There is no distension.     Tenderness: There is no abdominal tenderness.  Musculoskeletal:        General: No tenderness.     Cervical back: Normal range of motion and neck supple.     Right lower leg: No edema.     Left lower leg: No edema.  Skin:    General: Skin is warm and dry.  Neurological:  General: No focal deficit present.     Mental Status: She is alert and oriented to person, place, and time.  Psychiatric:        Mood and Affect: Mood normal.        Behavior: Behavior normal.        Thought Content: Thought content normal.    Assessment & Plan:  1: Chronic heart failure with reduced ejection fraction- - NYHA class II - weighing daily; reminded to call for an overnight weight gain of > 2 pounds or a weekly weight gain of > 5 pounds - weight down another 42 pounds from last visit here 2 months ago - has upcoming echo on 03/20/21 - saw cardiology ([place]ang) 12/18/20; returns 03/30/21 - on GDMT of carvedilol, entresto, farxiga and spironolactone - will stop the losartan and patient asked me to put that bottle in our expired med box for destroyal; she says that if she takes it home to get rid of it, she may accidentally start taking it again - will check BMP today since she's been taking both losartan and entresto - participating in paramedicine program - BNP 11/27/20 was 1043.9  2: HTN- - BP looks good (131/87) - BMP 12/08/20 reviewed and showed sodium 135, potassium 3.9, creatinine 1.10 & GFR >60  3: DM- - A1c 10/11/20 was 11.3% - nonfasting glucose in clinic today was 338; she says that  she ate a bowl of fruit loops but did take her insulin after eating  4: Substance use- - states last cocaine use was "quite awhile ago" but couldn't give me time frame - continued abstinence discussed   Medication bottles reviewed. Will now be getting medications from [facility] as [facility] is going to pay her copays for the remainder of this year. Pharmacy number & address put on AVS and told her to call them and see if they deliver.   Return here in 2 months or sooner for any questions/problems before then.

## 2021-02-12 ENCOUNTER — Telehealth: Payer: Self-pay | Admitting: Family

## 2021-02-12 ENCOUNTER — Other Ambulatory Visit
Admission: RE | Admit: 2021-02-12 | Discharge: 2021-02-12 | Disposition: A | Discharge status: Home / Self Care | Payer: Medicaid Other | Attending: Family | Admitting: Family

## 2021-02-12 ENCOUNTER — Other Ambulatory Visit: Payer: Self-pay

## 2021-02-12 ENCOUNTER — Encounter: Payer: Self-pay | Admitting: Family

## 2021-02-12 ENCOUNTER — Ambulatory Visit (HOSPITAL_BASED_OUTPATIENT_CLINIC_OR_DEPARTMENT_OTHER): Payer: Medicaid Other | Admitting: Family

## 2021-02-12 VITALS — BP 131/87 | HR 83 | Resp 18 | Ht 67.0 in | Wt 183.1 lb

## 2021-02-12 DIAGNOSIS — I10 Essential (primary) hypertension: Secondary | ICD-10-CM

## 2021-02-12 DIAGNOSIS — I5022 Chronic systolic (congestive) heart failure: Secondary | ICD-10-CM

## 2021-02-12 DIAGNOSIS — E1165 Type 2 diabetes mellitus with hyperglycemia: Secondary | ICD-10-CM | POA: Insufficient documentation

## 2021-02-12 DIAGNOSIS — F1910 Other psychoactive substance abuse, uncomplicated: Secondary | ICD-10-CM | POA: Diagnosis not present

## 2021-02-12 DIAGNOSIS — Z794 Long term (current) use of insulin: Secondary | ICD-10-CM | POA: Insufficient documentation

## 2021-02-12 DIAGNOSIS — I5020 Unspecified systolic (congestive) heart failure: Secondary | ICD-10-CM

## 2021-02-12 LAB — BASIC METABOLIC PANEL
Anion gap: 10 (ref 5–15)
BUN: 15 mg/dL (ref 6–20)
CO2: 32 mmol/L (ref 22–32)
Calcium: 8.8 mg/dL — ABNORMAL LOW (ref 8.9–10.3)
Chloride: 92 mmol/L — ABNORMAL LOW (ref 98–111)
Creatinine, Ser: 1.08 mg/dL — ABNORMAL HIGH (ref 0.44–1.00)
GFR, Estimated: >60 mL/min (ref >60)
Glucose, Bld: 370 mg/dL — ABNORMAL HIGH (ref 70–99)
Potassium: 3.4 mmol/L — ABNORMAL LOW (ref 3.5–5.1)
Sodium: 134 mmol/L — ABNORMAL LOW (ref 135–145)

## 2021-02-12 LAB — GLUCOSE, CAPILLARY: Glucose-Capillary: 338 mg/dL — ABNORMAL HIGH (ref 70–99)

## 2021-02-12 MED ORDER — TORSEMIDE 20 MG PO TABS: 20.0000 mg | ORAL_TABLET | Freq: Every day | ORAL | 5 refills | Status: DC

## 2021-02-12 NOTE — Patient Instructions (Addendum)
Continue weighing daily and call for an overnight weight gain of > 2 pounds or a weekly weight gain of >5 pounds.   [facility]: [address] [place], [place], [place] [address] Phone: [phone]  Call the pharmacy and ask if they deliver

## 2021-02-12 NOTE — Telephone Encounter (Signed)
Spoke with patient regarding lab results obtained earlier today. Renal function looks great and potassium is slightly low at 3.4. No longer is taking potassium supplements although does take spironolactone 25mg  daily.   Has been taking torsemide 40mg  daily so will decrease that to 20mg  daily with an extra 20mg  if needed for swelling or weight gain. Patient verbalized understanding and stated the instructions back to me.

## 2021-02-18 ENCOUNTER — Other Ambulatory Visit: Payer: Self-pay

## 2021-03-04 ENCOUNTER — Telehealth (HOSPITAL_COMMUNITY): Payer: Self-pay

## 2021-03-04 NOTE — Telephone Encounter (Signed)
Attempted to call, no answer-left message.   Earmon Phoenix [place] EMT-Paramedic [phone]

## 2021-03-20 ENCOUNTER — Ambulatory Visit (INDEPENDENT_AMBULATORY_CARE_PROVIDER_SITE_OTHER): Payer: Medicaid Other

## 2021-03-20 ENCOUNTER — Other Ambulatory Visit: Payer: Self-pay

## 2021-03-20 DIAGNOSIS — I428 Other cardiomyopathies: Secondary | ICD-10-CM | POA: Diagnosis not present

## 2021-03-20 LAB — ECHOCARDIOGRAM COMPLETE
AR max vel: 2.3 cm2
AV Area VTI: 2.18 cm2
AV Area mean vel: 2.44 cm2
AV Mean grad: 3 mmHg
AV Peak grad: 5.4 mmHg
Ao pk vel: 1.16 m/s
Area-P 1/2: 3.83 cm2
Calc EF: 34.9 %
S' Lateral: 4.7 cm
Single Plane A2C EF: 37.5 %
Single Plane A4C EF: 29.3 %

## 2021-03-23 ENCOUNTER — Telehealth: Payer: Self-pay | Admitting: Cardiology

## 2021-03-23 NOTE — Telephone Encounter (Signed)
Call transferred directly to this RN from scheduling. The patient called with complaints of SOB/ left sided chest pain that has been constant over the last 2 days. She states that her left hand is swollen.  She feels dizzy.  She is unable to weigh at home as her scale does not have working batteries.   The patient denies any recent medication changes/ increased fluid/ salt intake.   I inquired if her symptoms are made worse when changing position, coughing, or deep breathing.  She advised that coughing makes her pain a little worse and she is coughing up clear sputum.   The patient saw Clarisa Kindred, NP in the CHF clinic on 02/12/21 and was after having lab work done was advised: Spoke with patient regarding lab results obtained earlier today. Renal function looks great and potassium is slightly low at 3.4. No longer is taking potassium supplements although does take spironolactone 25mg  daily.    Has been taking torsemide 40mg  daily so will decrease that to 20mg  daily with an extra 20mg  if needed for swelling or weight gain. Patient verbalized understanding and stated the instructions back to me.  I inquired if the patient has taken any extra torsemide over the last few days and she advised she has not. I advised the patient to try to take an extra 20 mg of torsemide today to see if this helps with swelling or cough.  She is aware I will forward to Dr. for further recommendations and we will call her back.  The patient voices understanding and is agreeable.

## 2021-03-23 NOTE — Telephone Encounter (Signed)
Pt c/o of Chest Pain: STAT if CP now or developed within 24 hours  1. Are you having CP right now? Yes left side   2. Are you experiencing any other symptoms (ex. SOB, nausea, vomiting, sweating)? Left hand swelling slight sob labored breathing not audible   3. How long have you been experiencing CP? 2 days   4. Is your CP continuous or coming and going? Constant last night coming and going now   5. Have you taken Nitroglycerin? No  ?

## 2021-03-23 NOTE — Telephone Encounter (Signed)
Debbe Odea, MD  Sent: Mon March 23, 2021 11:00 AM  To: Jefferey Pica, RN  Cc: Vida Rigger Div [facility] Triage          Message  Continue current recommendations, keep follow-up appointment.  If symptoms persist or get worse, advise patient to go to the ED.    Thanks  BA

## 2021-03-23 NOTE — Telephone Encounter (Signed)
I spoke with the patient regarding Dr. Merita Norton recommendations as stated below. She voices understanding and is agreeable.

## 2021-03-30 ENCOUNTER — Encounter: Payer: Self-pay | Admitting: Cardiology

## 2021-03-30 ENCOUNTER — Ambulatory Visit: Payer: Self-pay | Admitting: Cardiology

## 2021-03-30 ENCOUNTER — Other Ambulatory Visit: Payer: Self-pay

## 2021-03-30 ENCOUNTER — Ambulatory Visit (INDEPENDENT_AMBULATORY_CARE_PROVIDER_SITE_OTHER): Payer: Medicaid Other | Admitting: Cardiology

## 2021-03-30 VITALS — BP 136/78 | HR 86 | Ht 67.0 in | Wt 191.0 lb

## 2021-03-30 DIAGNOSIS — E7800 Pure hypercholesterolemia, unspecified: Secondary | ICD-10-CM

## 2021-03-30 DIAGNOSIS — I428 Other cardiomyopathies: Secondary | ICD-10-CM

## 2021-03-30 NOTE — Progress Notes (Signed)
Cardiology Office Note:    Date:  03/30/2021   ID:  Molly Howard, DOB [birth year], MRN [id number]  PCP:  Patient, No Pcp Per (Inactive)   [place]  Cardiologist:  [name], MD  Advanced Practice Provider:  No care team member to display Electrophysiologist:  None       Referring MD: No ref. provider found   Chief Complaint  Patient presents with   Other    3 month follow up post ECHO -- Patient c.o chest pain and SOB. Meds reviewed verbally with patient.     History of Present Illness:    Molly Howard is a 35 y.o. female with a hx of NICM EF 20-25%, last EF 30-35%, hyperlipidemia, diabetes, former smoker x 88yr, polysubstance abuse, obesity presenting for follow-up.    Being seen for nonischemic cardiomyopathy and medication titration.  Tolerating current medications as prescribed.  Occasionally feels dizzy with standing.  BP usually normal to low normal.  Denies edema, takes torsemide 40 mg daily, KCl 10 mEq daily.  Last potassium was slightly low at 3.4.     Prior notes Echo 10/2020, EF 30 to 35%. Echo 05/20/2020 EF 20 to 25% Echo 10/2019 EF 35 to 40% Lexiscan Myoview 10/2019 no evidence for ischemia, EF 30% Mother has a history of heart failure, has a device placed not sure if pacemaker or ICD.  Past Medical History:  Diagnosis Date   Acid reflux    Chronic HFrEF (heart failure with reduced ejection fraction) ([place]    a. 10/2019 Echo: EF 35-40%, GrII DD; b. 05/2020 Echo: EF 20-25%, glob HK; c. 10/2020 Echo: EF 30-35%, glob HK. GrII DD, Mildly red RV fxn. Mod TR.   CKD (chronic kidney disease), stage II    Diabetes mellitus ([place]    H/O medication noncompliance    Hypertension    Microcytic anemia    N[place] ([place]mic [place]) ([place]    a. 10/2019 Echo: EF 35-40%; b. 10/2019 MV: No ischemia. Small apical defect-->breast attenuation; c. 05/2020 Echo: EF 20-25%; d. 10/2020 Echo: EF 30-35%, glob HK. GrII DD, Mildly red RV fxn. Mod TR.    Obesity    Polysubstance abuse ([place]     Past Surgical History:  Procedure Laterality Date   CHOLECYSTECTOMY     HERNIA REPAIR      Current Medications: Current Meds  Medication Sig   atorvastatin (LIPITOR) 20 MG tablet TAKE ONE TABLET BY MOUTH EVERY DAY.   blood glucose meter kit and supplies KIT Dispense based on patient and insurance preference. Use up to four times daily as directed. (FOR ICD-9 250.00, 250.01).   carvedilol (COREG) 6.25 MG tablet Take 1 tablet (6.25 mg total) by mouth 2 (two) times daily with a meal.   dapagliflozin propanediol (FARXIGA) 10 MG TABS tablet Take 1 tablet (10 mg total) by mouth once daily.   insulin aspart protamine- aspart (NOVOLOG MIX 70/30) (70-30) 100 UNIT/ML injection Inject 0.1 mLs (10 Units total) into the skin 2 (two) times daily with a meal.   iron polysaccharides (NIFEREX) 150 MG capsule Take 1 capsule (150 mg total) by mouth once daily.   potassium chloride SA (KLOR-CON) 20 MEQ tablet Take 10 mEq by mouth.   sacubitril-valsartan (ENTRESTO) 49-51 MG Take 1 tablet by mouth 2 (two) times daily.   spironolactone (ALDACTONE) 25 MG tablet Take 1 tablet (25 mg total) by mouth once daily.   Torsemide 40 MG TABS Take 40 mg by mouth daily.  Allergies:   No healthtouch food allergies   Social History   Socioeconomic History   Marital status: Single    Spouse name: Not on file   Number of children: Not on file   Years of education: Not on file   Highest education level: Not on file  Occupational History   Not on file  Tobacco Use   Smoking status: Former    Packs/day: 0.50    Types: Cigarettes   Smokeless tobacco: Never  Vaping Use   Vaping Use: Never used  Substance and Sexual Activity   Alcohol use: Not Currently    Comment: ~ 4 shots/day on weekends only   Drug use: Not Currently    Types: Cocaine    Comment: Last day of use was prior to June 2021 hosp.visit.    Sexual activity: Not on file  Other Topics Concern   Not on file   Social History Narrative   Not on file   Social Determinants of Health   Financial Resource Strain: High Risk   Difficulty of Paying Living Expenses: Hard  Food Insecurity: Food Insecurity Present   Worried About Running Out of Food in the Last Year: Often true   Arboriculturist in the Last Year: Often true  Transportation Needs: Public librarian (Medical): Yes   Lack of Transportation (Non-Medical): Yes  Physical Activity: Not on file  Stress: Not on file  Social Connections: Not on file     Family History: The patient's family history includes Diabetes in her father and mother; Heart failure in her mother; Hypertension in her father.  ROS:   Please see the history of present illness.     All other systems reviewed and are negative.  EKGs/Labs/Other Studies Reviewed:    The following studies were reviewed today:   EKG:  EKG is  ordered today.  The ekg ordered today demonstrates normal sinus rhythm  Recent Labs: 05/21/2020: ALT 13 11/27/2020: B Natriuretic Peptide 1,043.9 11/28/2020: Magnesium 1.6 12/08/2020: Hemoglobin 9.3; Platelets 426 02/12/2021: BUN 15; Creatinine, Ser 1.08; Potassium 3.4; Sodium 134  Recent Lipid Panel    Component Value Date/Time   CHOL 212 (H) 05/21/2020 0643   TRIG 121 05/21/2020 0643   HDL 35 (L) 05/21/2020 0643   CHOLHDL 6.1 05/21/2020 0643   VLDL 24 05/21/2020 0643   LDLCALC 153 (H) 05/21/2020 0643     Risk Assessment/Calculations:      Physical Exam:    VS:  BP 136/78 (BP Location: Left Arm, Patient Position: Sitting, Cuff Size: Normal)   Pulse 86   Ht $R'5\' 7"'uC$  (1.702 m)   Wt 191 lb (86.6 kg)   SpO2 96%   BMI 29.91 kg/m     Wt Readings from Last 3 Encounters:  03/30/21 191 lb (86.6 kg)  02/12/21 183 lb 2 oz (83.1 kg)  12/18/20 222 lb (100.7 kg)     GEN:  Well nourished, well developed in no acute distress HEENT: Normal NECK: No JVD; No carotid bruits LYMPHATICS: No  lymphadenopathy CARDIAC: RRR, no murmurs, rubs, gallops RESPIRATORY: Decreased breath sounds at bases ABDOMEN: Soft, non-tender, distended MUSCULOSKELETAL: no edema; No deformity   SKIN: Warm and dry NEUROLOGIC:  Alert and oriented x 3 PSYCHIATRIC:  Normal affect   ASSESSMENT:    1. NICM (nonischemic cardiomyopathy) ([place])   2. Pure hypercholesterolemia     PLAN:    In order of problems listed above:  Nonischemic cardiomyopathy, last EF 10/2020 was  30-35%. Lexiscan Myoview with no evidence for ischemia. Patient describes NYHA class II symptoms.  Continue Coreg 6.25 mg twice daily, Entresto 49-51 mg twice daily, Aldactone 25 mg daily, torsemide 40 mg daily, Farxiga 10 mg daily.  Check BMP.  Echo with EF 25 to 30%.  Refer patient to EP for ICD consideration.  Repeat echocardiogram in about 3 months. Hyperlipidemia, continue Lipitor 20 mg  Follow-up in 3 months after repeat echo.    Medication Adjustments/Labs and Tests Ordered: Current medicines are reviewed at length with the patient today.  Concerns regarding medicines are outlined above.  Orders Placed This Encounter  Procedures   Basic metabolic panel   Ambulatory referral to Cardiac Electrophysiology   EKG 12-Lead   ECHOCARDIOGRAM COMPLETE      No orders of the defined types were placed in this encounter.   Patient Instructions  Medication Instructions:  Your physician recommends that you continue on your current medications as directed. Please refer to the Current Medication list given to you today.  *If you need a refill on your cardiac medications before your next appointment, please call your pharmacy*   Lab Work:  BMP to be drawn today.   Testing/Procedures:  Your physician has requested that you have an echocardiogram in 3 months. Echocardiography is a painless test that uses sound waves to create images of your heart. It provides your doctor with information about the size and shape of your heart and how  well your heart's chambers and valves are working. This procedure takes approximately one hour. There are no restrictions for this procedure.    Follow-Up: At [facility], you and your health needs are our priority.  As part of our continuing mission to provide you with exceptional heart care, we have created designated Provider Care Teams.  These Care Teams include your primary Cardiologist (physician) and Advanced Practice Providers (APPs -  Physician Assistants and Nurse Practitioners) who all work together to provide you with the care you need, when you need it.  We recommend signing up for the patient portal called "MyChart".  Sign up information is provided on this After Visit Summary.  MyChart is used to connect with patients for Virtual Visits (Telemedicine).  Patients are able to view lab/test results, encounter notes, upcoming appointments, etc.  Non-urgent messages can be sent to your provider as well.   To learn more about what you can do with MyChart, go to [email or web address].    Your next appointment:   Follow up after Echo   The format for your next appointment:   In Person  Provider:   You may see [name], MD or one of the following Advanced Practice Providers on your designated Care Team:   Murray Hodgkins, NP Christell Faith, PA-C Cadence Kathlen Mody, [place]    Other Instructions    Signed, [name], MD  03/30/2021 12:29 PM    [place]

## 2021-03-30 NOTE — Patient Instructions (Signed)
Medication Instructions:  Your physician recommends that you continue on your current medications as directed. Please refer to the Current Medication list given to you today.  *If you need a refill on your cardiac medications before your next appointment, please call your pharmacy*   Lab Work:  BMP to be drawn today.   Testing/Procedures:  Your physician has requested that you have an echocardiogram in 3 months. Echocardiography is a painless test that uses sound waves to create images of your heart. It provides your doctor with information about the size and shape of your heart and how well your heart's chambers and valves are working. This procedure takes approximately one hour. There are no restrictions for this procedure.    Follow-Up: At [facility], you and your health needs are our priority.  As part of our continuing mission to provide you with exceptional heart care, we have created designated Provider Care Teams.  These Care Teams include your primary Cardiologist (physician) and Advanced Practice Providers (APPs -  Physician Assistants and Nurse Practitioners) who all work together to provide you with the care you need, when you need it.  We recommend signing up for the patient portal called "MyChart".  Sign up information is provided on this After Visit Summary.  MyChart is used to connect with patients for Virtual Visits (Telemedicine).  Patients are able to view lab/test results, encounter notes, upcoming appointments, etc.  Non-urgent messages can be sent to your provider as well.   To learn more about what you can do with MyChart, go to [email or web address].    Your next appointment:   Follow up after Echo   The format for your next appointment:   In Person  Provider:   You may see Debbe Odea, MD or one of the following Advanced Practice Providers on your designated Care Team:   Nicolasa Ducking, NP Eula Listen, PA-C Cadence Fransico Michael, [place]    Other  Instructions

## 2021-03-31 ENCOUNTER — Other Ambulatory Visit (HOSPITAL_COMMUNITY): Payer: Self-pay

## 2021-03-31 LAB — BASIC METABOLIC PANEL
BUN/Creatinine Ratio: 23 (ref 9–23)
BUN: 23 mg/dL — ABNORMAL HIGH (ref 6–20)
CO2: 26 mmol/L (ref 20–29)
Calcium: 8.2 mg/dL — ABNORMAL LOW (ref 8.7–10.2)
Chloride: 100 mmol/L (ref 96–106)
Creatinine, Ser: 0.98 mg/dL (ref 0.57–1.00)
Glucose: 296 mg/dL — ABNORMAL HIGH (ref 70–99)
Potassium: 4.1 mmol/L (ref 3.5–5.2)
Sodium: 137 mmol/L (ref 134–144)
eGFR: 77 mL/min/{1.73_m2} (ref >59)

## 2021-03-31 NOTE — Progress Notes (Signed)
Today had a home visit with Molly Howard.  I went to her pharmacy and picked up 4 of her medications, this does not include Entresto, Farxiga, or insulin.  They need a PA til they can fill it.  She is out of [place], will check on it tomorrow.  She had not called her new pharmacy yet, told her where they were and advise her she will have to get transportation to pick them up next month.  She is aware of how to take her meds, verified that.  She everything for daily living right now.  She does not watch high sodium foods or how much fluids.  Will continue to work with her.  She is making it to her appts.  She has not heard from disability yet.  She is aware that she will have to pay for her meds starting in January.  Advised her she will be able to pick up her next meds at end of December.  Will continue to visit for heart failure, diet and medication management.   Earmon Phoenix [place] EMT-Paramedic [phone]

## 2021-04-01 ENCOUNTER — Other Ambulatory Visit: Payer: Self-pay

## 2021-04-02 ENCOUNTER — Encounter (HOSPITAL_COMMUNITY): Payer: Self-pay

## 2021-04-02 NOTE — Progress Notes (Signed)
Picked up Teyla's farxiga, entresto and insulin,  took it to her at her home.  Advised her she will need to pick up her Decembers medications and will be the last month she will get the [employer] to pay for them.    Earmon Phoenix [place] EMT-Paramedic [phone]

## 2021-04-07 NOTE — Progress Notes (Signed)
Electrophysiology Office Note:    Date:  04/08/2021   ID:  Molly Howard, DOB 11-25-1985, MRN [id number]  PCP:  Patient, No Pcp Per (Inactive)  [facility] Cardiologist:  [name], MD  [facility] Electrophysiologist:  Vickie Epley, MD   Referring MD: [name], MD   Chief Complaint: NICM  History of Present Illness:    Molly Howard is a 35 y.o. female who presents for an evaluation of NICM at the request of Dr Garen Lah. Their medical history includes chronic systolic HF, CKD2, DM, HTN, NICM, polysubstance abuse, obesity. She was last seen by Dr Jasmine Pang on 03/30/2021.  Her mother has HF with a CIED in place.  Her mother's ICD shocked her at least 2 times but this seemingly was because of malfunctioning device.  The device was replaced in is apparently working better now.  She did have a cousin that passed away suddenly in his 52s due to a "heart attack".  Her sister who is older is apparently healthy.  Her mother's cardiomyopathy was diagnosed around the age of 43.       Past Medical History:  Diagnosis Date   Acid reflux    Chronic HFrEF (heart failure with reduced ejection fraction) ([place])    a. 10/2019 Echo: EF 35-40%, GrII DD; b. 05/2020 Echo: EF 20-25%, glob HK; c. 10/2020 Echo: EF 30-35%, glob HK. GrII DD, Mildly red RV fxn. Mod TR.   CKD (chronic kidney disease), stage II    Diabetes mellitus ([place])    H/O medication noncompliance    Hypertension    Microcytic anemia    NICM (nonischemic cardiomyopathy) ([place])    a. 10/2019 Echo: EF 35-40%; b. 10/2019 MV: No ischemia. Small apical defect-->breast attenuation; c. 05/2020 Echo: EF 20-25%; d. 10/2020 Echo: EF 30-35%, glob HK. GrII DD, Mildly red RV fxn. Mod TR.   Obesity    Polysubstance abuse ([place])     Past Surgical History:  Procedure Laterality Date   CHOLECYSTECTOMY     HERNIA REPAIR      Current Medications: Current Meds  Medication Sig   atorvastatin (LIPITOR) 20 MG tablet TAKE ONE  TABLET BY MOUTH EVERY DAY.   blood glucose meter kit and supplies KIT Dispense based on patient and insurance preference. Use up to four times daily as directed. (FOR ICD-9 250.00, 250.01).   carvedilol (COREG) 6.25 MG tablet Take 1 tablet (6.25 mg total) by mouth 2 (two) times daily with a meal.   dapagliflozin propanediol (FARXIGA) 10 MG TABS tablet Take 1 tablet (10 mg total) by mouth once daily.   insulin aspart protamine- aspart (NOVOLOG MIX 70/30) (70-30) 100 UNIT/ML injection Inject 0.1 mLs (10 Units total) into the skin 2 (two) times daily with a meal.   iron polysaccharides (NIFEREX) 150 MG capsule Take 1 capsule (150 mg total) by mouth once daily.   potassium chloride SA (KLOR-CON M) 20 MEQ tablet Take 10 mEq by mouth.   sacubitril-valsartan (ENTRESTO) 49-51 MG Take 1 tablet by mouth 2 (two) times daily.   spironolactone (ALDACTONE) 25 MG tablet Take 1 tablet (25 mg total) by mouth once daily.   torsemide (DEMADEX) 20 MG tablet Take 20 mg by mouth 2 (two) times daily.   Torsemide 40 MG TABS Take 40 mg by mouth daily.     Allergies:   No healthtouch food allergies   Social History   Socioeconomic History   Marital status: Single    Spouse name: Not on file  Number of children: Not on file   Years of education: Not on file   Highest education level: Not on file  Occupational History   Not on file  Tobacco Use   Smoking status: Former    Packs/day: 0.50    Types: Cigarettes   Smokeless tobacco: Never  Vaping Use   Vaping Use: Never used  Substance and Sexual Activity   Alcohol use: Not Currently    Comment: ~ 4 shots/day on weekends only   Drug use: Not Currently    Types: Cocaine    Comment: Last day of use was prior to June 2021 hosp.visit.    Sexual activity: Not on file  Other Topics Concern   Not on file  Social History Narrative   Not on file   Social Determinants of Health   Financial Resource Strain: High Risk   Difficulty of Paying Living Expenses: Hard   Food Insecurity: Food Insecurity Present   Worried About Running Out of Food in the Last Year: Often true   Arboriculturist in the Last Year: Often true  Transportation Needs: Public librarian (Medical): Yes   Lack of Transportation (Non-Medical): Yes  Physical Activity: Not on file  Stress: Not on file  Social Connections: Not on file     Family History: The patient's family history includes Diabetes in her father and mother; Heart failure in her mother; Hypertension in her father.  ROS:   Please see the history of present illness.    All other systems reviewed and are negative.  EKGs/Labs/Other Studies Reviewed:    The following studies were reviewed today:  03/20/2021 Echo LVEF25% RV mildly reduced RVSP 70 LA mildly dilated Mild mr Mild to mod TR  03/30/2021 ECG shows narrow QRS.  EKG:  The ekg ordered today demonstrates sinus rhythm   Recent Labs: 05/21/2020: ALT 13 11/27/2020: B Natriuretic Peptide 1,043.9 11/28/2020: Magnesium 1.6 12/08/2020: Hemoglobin 9.3; Platelets 426 03/30/2021: BUN 23; Creatinine, Ser 0.98; Potassium 4.1; Sodium 137  Recent Lipid Panel    Component Value Date/Time   CHOL 212 (H) 05/21/2020 0643   TRIG 121 05/21/2020 0643   HDL 35 (L) 05/21/2020 0643   CHOLHDL 6.1 05/21/2020 0643   VLDL 24 05/21/2020 0643   LDLCALC 153 (H) 05/21/2020 0643    Physical Exam:    VS:  BP 132/72   Pulse 91   Ht '5\' 7"'  (1.702 m)   Wt 184 lb 12.8 oz (83.8 kg)   SpO2 99%   BMI 28.94 kg/m     Wt Readings from Last 3 Encounters:  04/08/21 184 lb 12.8 oz (83.8 kg)  03/30/21 191 lb (86.6 kg)  02/12/21 183 lb 2 oz (83.1 kg)     GEN:  Well nourished, well developed in no acute distress HEENT: Normal NECK: No JVD; No carotid bruits LYMPHATICS: No lymphadenopathy CARDIAC: RRR, no murmurs, rubs, gallops RESPIRATORY:  Clear to auscultation without rales, wheezing or rhonchi  ABDOMEN: Soft, non-tender,  non-distended MUSCULOSKELETAL:  No edema; No deformity  SKIN: Warm and dry NEUROLOGIC:  Alert and oriented x 3 PSYCHIATRIC:  Normal affect       ASSESSMENT:    1. Chronic combined systolic and diastolic heart failure ([place])   2. NICM (nonischemic cardiomyopathy) ([place])   3. Polysubstance abuse ([place])   4. Type 2 diabetes mellitus with hyperglycemia, with long-term current use of insulin ([facility])   5. Essential hypertension    PLAN:  In order of problems listed above:  #Chronic combined systolic and diastolic heart failure NYHA class II-III.  Warm and dry on exam today.  Nonischemic etiology.  I do think she is a candidate for an ICD.  I would like her to have a cardiac MRI prior to implanting an ICD.  I will also initiate a referral to Arlie Solomons in genetics for genetic testing.  We discussed the 2 types of ICD's.  Given her young age, favor implanting a subcutaneous ICD.  I discussed this in detail with the patient clear the risks and she wishes to proceed.  The patient has a nonischemic CM (EF 25%), NYHA Class III CHF.  She is referred by Dr Garen Lah for risk stratification of sudden death and consideration of ICD implantation.  At this time, she meets criteria for ICD implantation for primary prevention of sudden death.  I have had a thorough discussion with the patient reviewing options.  The patient and their family (if available) have had opportunities to ask questions and have them answered. The patient and I have decided together through a shared decision making process to proceed with ICD implant at this time.    Risks, benefits, alternatives to ICD implantation were discussed in detail with the patient today. The patient understands that the risks include but are not limited to bleeding, infection, pneumothorax, perforation, tamponade, vascular damage, renal failure, MI, stroke, death, inappropriate shocks, and lead dislodgement and wishes to proceed.  We will therefore schedule  device implantation at the next available time.  Plan for [place] subcutaneous ICD.  #Polysubstance abuse Encouraged continued abstinence  #Diabetes Blood sugars have been elevated but her hemoglobin A1c is trending down.  Recommend continued use of her insulin and follow-up with primary care physician.  Given her elevated sugars, I again favor subcutaneous device rather than a transvenous because of the increased risk of infection.  #Hypertension Controlled.  Continue current regimen.    Total time spent with patient today 65 minutes. This includes reviewing records, evaluating the patient and coordinating care.  Medication Adjustments/Labs and Tests Ordered: Current medicines are reviewed at length with the patient today.  Concerns regarding medicines are outlined above.  No orders of the defined types were placed in this encounter.  No orders of the defined types were placed in this encounter.    Signed, Hilton Cork. Quentin Ore, MD, [facility], [facility] 04/08/2021 9:16 AM    Electrophysiology [facility]

## 2021-04-08 ENCOUNTER — Ambulatory Visit (INDEPENDENT_AMBULATORY_CARE_PROVIDER_SITE_OTHER): Payer: Medicaid Other | Admitting: Cardiology

## 2021-04-08 ENCOUNTER — Other Ambulatory Visit: Payer: Self-pay

## 2021-04-08 ENCOUNTER — Encounter: Payer: Self-pay | Admitting: Cardiology

## 2021-04-08 VITALS — BP 132/72 | HR 91 | Ht 67.0 in | Wt 184.8 lb

## 2021-04-08 DIAGNOSIS — I428 Other cardiomyopathies: Secondary | ICD-10-CM | POA: Diagnosis not present

## 2021-04-08 DIAGNOSIS — E1165 Type 2 diabetes mellitus with hyperglycemia: Secondary | ICD-10-CM | POA: Diagnosis not present

## 2021-04-08 DIAGNOSIS — Z794 Long term (current) use of insulin: Secondary | ICD-10-CM

## 2021-04-08 DIAGNOSIS — F1910 Other psychoactive substance abuse, uncomplicated: Secondary | ICD-10-CM | POA: Diagnosis not present

## 2021-04-08 DIAGNOSIS — I5042 Chronic combined systolic (congestive) and diastolic (congestive) heart failure: Secondary | ICD-10-CM

## 2021-04-08 DIAGNOSIS — I10 Essential (primary) hypertension: Secondary | ICD-10-CM

## 2021-04-08 NOTE — Patient Instructions (Addendum)
Medication Instructions:  Your physician recommends that you continue on your current medications as directed. Please refer to the Current Medication list given to you today. *If you need a refill on your cardiac medications before your next appointment, please call your pharmacy*  Lab Work: You will get lab work today:  H&H If you have labs (blood work) drawn today and your tests are completely normal, you will receive your results only by: MyChart Message (if you have MyChart) OR A paper copy in the mail If you have any lab test that is abnormal or we need to change your treatment, we will call you to review the results.  Testing/Procedures: Your physician has requested that you have a cardiac MRI. Cardiac MRI uses a computer to create images of your heart as its beating, producing both still and moving pictures of your heart and major blood vessels.   Follow-Up: At [facility], you and your health needs are our priority.  As part of our continuing mission to provide you with exceptional heart care, we have created designated Provider Care Teams.  These Care Teams include your primary Cardiologist (physician) and Advanced Practice Providers (APPs -  Physician Assistants and Nurse Practitioners) who all work together to provide you with the care you need, when you need it.  Your next appointment:    You will be scheduled to see Dr. Sidney Ace at the [facility] office.  You are scheduled for Cardiac MRI on ______________. Please arrive at the [facility] main entrance of [facility] at _______________ (30-45 minutes prior to test start time). ?  [facility]  [address]  [place], [place] [address]  [phone]  Proceed to the [facility] Radiology Department (First Floor).  ?  Magnetic resonance imaging (MRI) is a painless test that produces images of the inside of the body without using X-rays. During an MRI, strong magnets and radio waves work  together in a Data processing manager to form detailed images.   MRI images may provide more details about a medical condition than X-rays, CT scans, and ultrasounds can provide.   You may be given earphones to listen for instructions.   You may eat a light breakfast and take medications as ordered with the exception of torsemide, spironolactone  If a contrast material will be used, an IV will be inserted into one of your veins. Contrast material will be injected into your IV.   You will be asked to remove all metal, including: Watch, jewelry, and other metal objects including hearing aids, hair pieces and dentures. (Braces and fillings normally are not a problem.)   If contrast material was used:  It will leave your body through your urine within a day. You may be told to drink plenty of fluids to help flush the contrast material out of your system.  TEST WILL TAKE APPROXIMATELY 1 HOUR  PLEASE NOTIFY SCHEDULING AT LEAST 24 HOURS IN ADVANCE IF YOU ARE UNABLE TO KEEP YOUR APPOINTMENT.

## 2021-04-09 ENCOUNTER — Telehealth: Payer: Self-pay

## 2021-04-09 DIAGNOSIS — I5042 Chronic combined systolic (congestive) and diastolic (congestive) heart failure: Secondary | ICD-10-CM

## 2021-04-09 LAB — HEMOGLOBIN AND HEMATOCRIT, BLOOD
Hematocrit: 37.3 % (ref 34.0–46.6)
Hemoglobin: 11.9 g/dL (ref 11.1–15.9)

## 2021-04-09 NOTE — Telephone Encounter (Signed)
Return call received from Pt.  Pt scheduled for subcut ICD on May 25, 2021  Labs scheduled for May 18, 2021  Updated instruction letter and mailed to Pt  Work up complete.

## 2021-04-09 NOTE — Telephone Encounter (Signed)
Left message for Pt advising availability of subc icd implant dates:  January 23, 26-1:30 pm February 16- 12:30 pm June 25, 2020- 11:30 am February 28-12:30 pm

## 2021-04-14 ENCOUNTER — Other Ambulatory Visit: Payer: Self-pay

## 2021-04-14 ENCOUNTER — Ambulatory Visit: Payer: Medicaid Other | Admitting: Genetic Counselor

## 2021-04-14 DIAGNOSIS — I5042 Chronic combined systolic (congestive) and diastolic (congestive) heart failure: Secondary | ICD-10-CM

## 2021-04-20 NOTE — Progress Notes (Signed)
Pre Test GC  Referring Provider: Steffanie Dunn, MD   Referral Reason  Taren Dymek was referred for genetic consult and testing for Non-ischemic cardiomyopathy (NICM) in light of her early presentation of NICM.  Personal Medical Information Torre (III.1 on pedigree), a pleasant 35 year old African American woman who, late last year, began having fluid retention, severe dyspnea that felt she was running a marathon, chest pains and dizziness. She tells me that this year she has been to the hospital thrice to drain the excess fluid and was told that she has a bad heart.  Traditional Risk Factors Aracelly denies having other cardiac or systemic conditions that can cause non-ischemic cardiomyopathy, namely, ischemic heart disease, myocarditis, infiltrative myocardial disease (amyloidosis, sarcoidosis, hemochromatosis), infection with HIV virus, connective tissue disease (such as systemic lupus erythematosus etc.), doxorubicin therapy and of having other cardiovascular diseases (valvular heart disease, HCM).  She tells me that she has a history of drug and alcohol abuse that lasted about 2 years. She says that she and her mother would abuse drugs and alcohol together. She was also diagnosed with hypertension around age 34 and states that it is well controlled.    Family history Anu (III.1) is the only biological child of her parents. She does not know much about her father, other than that he is 14 and had HTN. She is not in touch with his siblings (II.1-II.3) or parents (I.1-I.2) who are now in their 50s.  Odile's mother (II.5), age 61 was diagnosed with NICM at age 42. She immediately underwent ICD implantation. She reports that her mother did not have the risk factors associated with NICM at the time of her diagnosis. Later she was involved in alcohol and drug abuse. Her mother was adopted and she is unaware of her biological siblings or parents.   Pre-test Genetic Consult notes  I reviewed the  different genetic cardiomyopathies that are considered non-ischemic cardiomyopathy, namely ARVC (now called ACM), DCM, HCM and LVNC. I discussed the genetics of HCM, DCM, LVNC and ACM, namely inheritance, incomplete penetrance, variable expression and digenic/compound mutations that can be seen in some patients. We walked through the process of genetic testing. I explained to her that there are three possible outcomes of genetic testing; namely positive, negative and finding a variant of unknown significance. A positive outcome can be expected in cases that do not have risk factors for a cardiomyopathy, present early in life with increased severity and have a family history of sudden cardiac death and/or a relative that has been diagnosed with cardiomyopathy. Limitations in current genetic testing methodology can produce a negative result. Variants of unknown significance (VUS) are also observed. I explained to her that typically a VUS is so classified if the variant is not well understood as very few individuals have been reported to harbor this variant or its role in gene function has not been elucidated. The potential outcomes of genetic testing and subsequent management of at-risk family members were discussed so as to manage expectations.   Impression  In summary, Deshea's early age and severity of presentation along with the significant family history of NICM in her mother is indicative of a genetic condition. However, her diagnosis is confounded by polysubstance abuse. Nevertheless, genetic testing for genes implicated in nonischemic cardiomyopathy is highly recommended. The test will determine the underlying genetic basis of her condition and stratify risk of NICM in her family.   In addition, we discussed the protections afforded by the Genetic Information Non-Discrimination Act (  GINA). I explained to her that GINA protects her from losing her employment or health insurance based on her genotype.  However, these protections do not cover life insurance and disability. She verbalized understanding of this and notes that her maternal half-sister does not have life insurance.  Her medical insurance considers genetic testing for NICM investigational and will not cover this test. I informed her that there are labs that have sponsored low/free testing alternatives. However, such programs do not assure privacy of genetic data. She is unwilling to use these labs.  Please note that the patient has not been counseled in this visit on personal, cultural or ethical issues that she may face due to her heart condition.   Plan After a thorough discussion of the risk and benefits of genetic testing for NICM, Saiya declines genetic testing for NICM.   Sidney Ace, Ph.D, [facility] Clinical Molecular Geneticist

## 2021-04-21 ENCOUNTER — Ambulatory Visit: Payer: Medicaid Other | Attending: Family | Admitting: Family

## 2021-04-21 ENCOUNTER — Encounter: Payer: Self-pay | Admitting: Family

## 2021-04-21 ENCOUNTER — Other Ambulatory Visit: Payer: Self-pay

## 2021-04-21 VITALS — BP 133/77 | HR 83 | Resp 20 | Ht 67.0 in | Wt 181.0 lb

## 2021-04-21 DIAGNOSIS — Z87891 Personal history of nicotine dependence: Secondary | ICD-10-CM | POA: Diagnosis not present

## 2021-04-21 DIAGNOSIS — I10 Essential (primary) hypertension: Secondary | ICD-10-CM

## 2021-04-21 DIAGNOSIS — R42 Dizziness and giddiness: Secondary | ICD-10-CM | POA: Insufficient documentation

## 2021-04-21 DIAGNOSIS — Z09 Encounter for follow-up examination after completed treatment for conditions other than malignant neoplasm: Secondary | ICD-10-CM | POA: Insufficient documentation

## 2021-04-21 DIAGNOSIS — I130 Hypertensive heart and chronic kidney disease with heart failure and stage 1 through stage 4 chronic kidney disease, or unspecified chronic kidney disease: Secondary | ICD-10-CM | POA: Diagnosis not present

## 2021-04-21 DIAGNOSIS — R5383 Other fatigue: Secondary | ICD-10-CM | POA: Insufficient documentation

## 2021-04-21 DIAGNOSIS — Z794 Long term (current) use of insulin: Secondary | ICD-10-CM

## 2021-04-21 DIAGNOSIS — Z79899 Other long term (current) drug therapy: Secondary | ICD-10-CM | POA: Diagnosis not present

## 2021-04-21 DIAGNOSIS — E1122 Type 2 diabetes mellitus with diabetic chronic kidney disease: Secondary | ICD-10-CM | POA: Insufficient documentation

## 2021-04-21 DIAGNOSIS — Z7901 Long term (current) use of anticoagulants: Secondary | ICD-10-CM | POA: Insufficient documentation

## 2021-04-21 DIAGNOSIS — R0602 Shortness of breath: Secondary | ICD-10-CM | POA: Insufficient documentation

## 2021-04-21 DIAGNOSIS — F1910 Other psychoactive substance abuse, uncomplicated: Secondary | ICD-10-CM

## 2021-04-21 DIAGNOSIS — I5042 Chronic combined systolic (congestive) and diastolic (congestive) heart failure: Secondary | ICD-10-CM | POA: Diagnosis not present

## 2021-04-21 DIAGNOSIS — M549 Dorsalgia, unspecified: Secondary | ICD-10-CM | POA: Diagnosis not present

## 2021-04-21 DIAGNOSIS — I5022 Chronic systolic (congestive) heart failure: Secondary | ICD-10-CM | POA: Diagnosis not present

## 2021-04-21 DIAGNOSIS — Z7984 Long term (current) use of oral hypoglycemic drugs: Secondary | ICD-10-CM | POA: Diagnosis not present

## 2021-04-21 DIAGNOSIS — N182 Chronic kidney disease, stage 2 (mild): Secondary | ICD-10-CM | POA: Diagnosis not present

## 2021-04-21 DIAGNOSIS — G479 Sleep disorder, unspecified: Secondary | ICD-10-CM | POA: Insufficient documentation

## 2021-04-21 DIAGNOSIS — R002 Palpitations: Secondary | ICD-10-CM | POA: Diagnosis not present

## 2021-04-21 DIAGNOSIS — E1165 Type 2 diabetes mellitus with hyperglycemia: Secondary | ICD-10-CM | POA: Diagnosis not present

## 2021-04-21 NOTE — Patient Instructions (Signed)
Continue weighing daily and call for an overnight weight gain of 3 pounds or more or a weekly weight gain of more than 5 pounds.  °

## 2021-04-21 NOTE — Progress Notes (Signed)
Patient ID: Molly Howard, female    DOB: [birth year], 35 y.o.   MRN: [id number]  HPI  Molly Howard is a 35 y/o female with a history of DM, HTN, previous drug use and chronic heart failure.   Echo report from 03/20/21 reviewed and showed an EF of 25-30% along with severely elevated PA pressure of 70.8 mmHg, mild LAE, mild MR and mild/moderate TR. Echo report from 10/11/20 reviewed and showed an EF of 30-35% along with moderate TR.   Admitted 11/27/20 due to acute on chronic HF with orthopnea and 40 pound weight gain. Initially given IV lasix. Given IV hydralazine for HTN. Cardiology consult obtained. IV meds transitioned to oral medications. UDS + for cocaine and opiates. Discharged after 11 days.   Molly Howard presents today for a follow-up visit with a chief complaint of moderate shortness of breath with little exertion. Molly Howard describes this as chronic in nature having been present for several years. Molly Howard has associated fatigue, palpitations, light-headedness, chronic difficulty sleeping and back pain along with this. Molly Howard denies any abdominal distention, pedal edema, chest pain, cough or weight gain.   Will be having AICD implanted January 2023  Past Medical History:  Diagnosis Date   Acid reflux    Chronic HFrEF (heart failure with reduced ejection fraction) ([place])    a. 10/2019 Echo: EF 35-40%, GrII DD; b. 05/2020 Echo: EF 20-25%, glob HK; c. 10/2020 Echo: EF 30-35%, glob HK. GrII DD, Mildly red RV fxn. Mod TR.   CKD (chronic kidney disease), stage II    Diabetes mellitus ([place])    H/O medication noncompliance    Hypertension    Microcytic anemia    NICM (nonischemic cardiomyopathy) ([place])    a. 10/2019 Echo: EF 35-40%; b. 10/2019 MV: No ischemia. Small apical defect-->breast attenuation; c. 05/2020 Echo: EF 20-25%; d. 10/2020 Echo: EF 30-35%, glob HK. GrII DD, Mildly red RV fxn. Mod TR.   Obesity    Polysubstance abuse ([place])    Past Surgical History:  Procedure Laterality Date   CHOLECYSTECTOMY     HERNIA  REPAIR     Family History  Problem Relation Age of Onset   Heart failure Mother        a. onset in her 66s   Diabetes Mother    Hypertension Father    Diabetes Father    Social History   Tobacco Use   Smoking status: Former    Packs/day: 0.50    Types: Cigarettes   Smokeless tobacco: Never  Substance Use Topics   Alcohol use: Not Currently    Comment: ~ 4 shots/day on weekends only   Allergies  Allergen Reactions   No [facility] Food Allergies Rash and Other (See Comments)    Lemons   Prior to Admission medications   Medication Sig Start Date End Date Taking? Authorizing Provider  atorvastatin (LIPITOR) 20 MG tablet TAKE ONE TABLET BY MOUTH EVERY DAY. 12/29/20  Yes Darylene Price A, FNP  blood glucose meter kit and supplies KIT Dispense based on patient and insurance preference. Use up to four times daily as directed. (FOR ICD-9 250.00, 250.01). 12/08/20  Yes Max Sane, MD  carvedilol (COREG) 6.25 MG tablet Take 1 tablet (6.25 mg total) by mouth 2 (two) times daily with a meal. 12/11/20  Yes Darylene Price A, FNP  dapagliflozin propanediol (FARXIGA) 10 MG TABS tablet Take 1 tablet (10 mg total) by mouth once daily. 12/11/20  Yes Darylene Price A, FNP  insulin aspart protamine- aspart (NOVOLOG  MIX 70/30) (70-30) 100 UNIT/ML injection Inject 0.1 mLs (10 Units total) into the skin 2 (two) times daily with a meal. 12/08/20  Yes Max Sane, MD  potassium chloride SA (KLOR-CON M) 20 MEQ tablet Take 10 mEq by mouth.   Yes [provider]  sacubitril-valsartan (ENTRESTO) 49-51 MG Take 1 tablet by mouth 2 (two) times daily. 12/22/20  Yes [name], MD  spironolactone (ALDACTONE) 25 MG tablet Take 1 tablet (25 mg total) by mouth once daily. 12/11/20  Yes Darylene Price A, FNP  torsemide (DEMADEX) 20 MG tablet Take 20 mg by mouth 2 (two) times daily. 03/31/21  Yes [provider]  Torsemide 40 MG TABS Take 40 mg by mouth daily. Patient not taking: Reported on 04/21/2021     [provider]    Review of Systems  Constitutional:  Positive for fatigue. Negative for appetite change.  HENT:  Negative for congestion, postnasal drip and sore throat.   Eyes: Negative.   Respiratory:  Positive for shortness of breath (easily). Negative for cough.   Cardiovascular:  Positive for palpitations. Negative for chest pain and leg swelling.  Gastrointestinal:  Negative for abdominal distention and abdominal pain.  Endocrine: Negative.   Genitourinary: Negative.   Musculoskeletal:  Positive for back pain. Negative for arthralgias.  Skin: Negative.   Allergic/Immunologic: Negative.   Neurological:  Positive for light-headedness. Negative for dizziness.  Hematological:  Negative for adenopathy. Does not bruise/bleed easily.  Psychiatric/Behavioral:  Positive for sleep disturbance (sleeping on 2 pillows). Negative for dysphoric mood. The patient is not nervous/anxious.    Vitals:   04/21/21 1109  BP: 133/77  Pulse: 83  Resp: 20  SpO2: 100%  Weight: 181 lb (82.1 kg)  Height: _0  (1.702 m)   Wt Readings from Last 3 Encounters:  04/21/21 181 lb (82.1 kg)  04/08/21 184 lb 12.8 oz (83.8 kg)  03/30/21 191 lb (86.6 kg)   Lab Results  Component Value Date   CREATININE 0.98 03/30/2021   CREATININE 1.08 (H) 02/12/2021   CREATININE 1.10 (H) 12/08/2020   Physical Exam Vitals and nursing note reviewed.  Constitutional:      Appearance: Normal appearance.  HENT:     Head: Normocephalic and atraumatic.  Cardiovascular:     Rate and Rhythm: Normal rate and regular rhythm.  Pulmonary:     Effort: Pulmonary effort is normal. No respiratory distress.     Breath sounds: No wheezing or rales.  Abdominal:     General: There is no distension.     Tenderness: There is no abdominal tenderness.  Musculoskeletal:        General: No tenderness.     Cervical back: Normal range of motion and neck supple.     Right lower leg: No edema.     Left lower leg: No edema.   Skin:    General: Skin is warm and dry.  Neurological:     General: No focal deficit present.     Mental Status: Molly Howard is alert and oriented to person, place, and time.  Psychiatric:        Mood and Affect: Mood normal.        Behavior: Behavior normal.        Thought Content: Thought content normal.    Assessment & Plan:  1: Chronic heart failure with reduced ejection fraction- - NYHA class III - weighing daily; reminded to call for an overnight weight gain of > 2 pounds or a weekly weight gain of >  5 pounds - weight down 2 pounds from last visit here 2 months ago - saw cardiology ([facility]ang) 03/30/21 - saw EP Quentin Ore) 04/08/21 and is scheduled for AICD on 05/25/20 - on GDMT of carvedilol, entresto, farxiga and spironolactone - participating in paramedicine program - BNP 11/27/20 was 1043.9  2: HTN- - BP looks good (133/77) - does not have PCP so appointment scheduled with [facility] on 06/12/20 - BMP 03/30/21 reviewed and showed sodium 137, potassium 4.1, creatinine 0.98 & GFR 77  3: DM- - A1c 10/11/20 was 11.3% - nonfasting glucose at home today was 200  4: Substance use- - states last cocaine use was "many months ago" - continued abstinence discussed   Medication bottles reviewed.   Return in 3 months or sooner for any questions/problems before then

## 2021-04-29 ENCOUNTER — Other Ambulatory Visit (HOSPITAL_COMMUNITY): Payer: Self-pay

## 2021-04-29 ENCOUNTER — Encounter (HOSPITAL_COMMUNITY): Payer: Self-pay

## 2021-04-29 ENCOUNTER — Other Ambulatory Visit: Payer: Self-pay | Admitting: Family

## 2021-04-29 ENCOUNTER — Telehealth (HOSPITAL_COMMUNITY): Payer: Self-pay

## 2021-04-29 MED ORDER — POTASSIUM CHLORIDE CRYS ER 20 MEQ PO TBCR: 10.0000 meq | EXTENDED_RELEASE_TABLET | Freq: Every day | ORAL | 3 refills | Status: DC

## 2021-04-29 NOTE — Telephone Encounter (Signed)
Made entry by mistake.

## 2021-04-29 NOTE — Progress Notes (Signed)
I was able to reach Molly Howard and she said she had no ride to pick up her meds.  I advised her I will pick them up this last time.  Reminded her she will have to start paying her co pays starting next month, they are $4.00 a medication.  She states she will be able to do it.  Picked her meds up and took them to her.  She thanked me.  She states been feeling good, no weight gain.  She stays active around the house.  She denies any chest pain, headaches, dizziness or increased shortness of breath.  She states she has everything right now for daily living.  Will continue to visit for heart failure, diet and medication management.   Earmon Phoenix [place] EMT-Paramedic [phone]

## 2021-04-29 NOTE — Progress Notes (Signed)
Contacted [facility] and they advised will get her medications ready.  This will be the last month the foundation will pay for her meds.  She has been made aware several times.  Attempted to contact her and left message for her to contact me back about her meds.  She needs to pick them up this week.  Will keep trying to reach her.   Earmon Phoenix [place] EMT-Paramedic [phone]

## 2021-04-30 ENCOUNTER — Other Ambulatory Visit: Payer: Self-pay

## 2021-05-18 ENCOUNTER — Other Ambulatory Visit: Payer: Self-pay

## 2021-05-18 ENCOUNTER — Other Ambulatory Visit (INDEPENDENT_AMBULATORY_CARE_PROVIDER_SITE_OTHER): Payer: Medicaid Other

## 2021-05-18 ENCOUNTER — Telehealth (HOSPITAL_COMMUNITY): Payer: Self-pay | Admitting: *Deleted

## 2021-05-18 DIAGNOSIS — I5042 Chronic combined systolic (congestive) and diastolic (congestive) heart failure: Secondary | ICD-10-CM

## 2021-05-18 NOTE — Telephone Encounter (Signed)
Reaching out to patient to offer assistance regarding upcoming cardiac imaging study; pt verbalizes understanding of appt date/time, parking situation and where to check in, and verified current allergies; name and call back number provided for further questions should they arise  Larey Brick RN Navigator Cardiac Imaging [facility] [phone] office [phone] cell  Patient denies metal or claustophobia. She did state she got nauseated the last time. Pt is using [facility] transports. She reports small veins.

## 2021-05-19 ENCOUNTER — Ambulatory Visit (HOSPITAL_COMMUNITY)
Admission: RE | Admit: 2021-05-19 | Discharge: 2021-05-19 | Disposition: A | Discharge status: Home / Self Care | Payer: Medicaid Other | Source: Ambulatory Visit | Attending: Cardiology | Admitting: Cardiology

## 2021-05-19 DIAGNOSIS — I428 Other cardiomyopathies: Secondary | ICD-10-CM | POA: Diagnosis not present

## 2021-05-19 DIAGNOSIS — I5042 Chronic combined systolic (congestive) and diastolic (congestive) heart failure: Secondary | ICD-10-CM

## 2021-05-19 LAB — CBC WITH DIFFERENTIAL/PLATELET
Basophils Absolute: 0.1 10*3/uL (ref 0.0–0.2)
Basos: 2 %
EOS (ABSOLUTE): 0.5 10*3/uL — ABNORMAL HIGH (ref 0.0–0.4)
Eos: 6 %
Hematocrit: 36 % (ref 34.0–46.6)
Hemoglobin: 11.9 g/dL (ref 11.1–15.9)
Immature Grans (Abs): 0 10*3/uL (ref 0.0–0.1)
Immature Granulocytes: 0 %
Lymphocytes Absolute: 1.8 10*3/uL (ref 0.7–3.1)
Lymphs: 24 %
MCH: 31.3 pg (ref 26.6–33.0)
MCHC: 33.1 g/dL (ref 31.5–35.7)
MCV: 95 fL (ref 79–97)
Monocytes Absolute: 0.6 10*3/uL (ref 0.1–0.9)
Monocytes: 8 %
Neutrophils Absolute: 4.4 10*3/uL (ref 1.4–7.0)
Neutrophils: 60 %
Platelets: 436 10*3/uL (ref 150–450)
RBC: 3.8 x10E6/uL (ref 3.77–5.28)
RDW: 12.9 % (ref 11.7–15.4)
WBC: 7.4 10*3/uL (ref 3.4–10.8)

## 2021-05-19 LAB — BASIC METABOLIC PANEL
BUN/Creatinine Ratio: 19 (ref 9–23)
BUN: 17 mg/dL (ref 6–20)
CO2: 24 mmol/L (ref 20–29)
Calcium: 8.6 mg/dL — ABNORMAL LOW (ref 8.7–10.2)
Chloride: 99 mmol/L (ref 96–106)
Creatinine, Ser: 0.9 mg/dL (ref 0.57–1.00)
Glucose: 259 mg/dL — ABNORMAL HIGH (ref 70–99)
Potassium: 4.3 mmol/L (ref 3.5–5.2)
Sodium: 137 mmol/L (ref 134–144)
eGFR: 85 mL/min/{1.73_m2} (ref >59)

## 2021-05-19 MED ORDER — GADOBUTROL 1 MMOL/ML IV SOLN
10.0000 mL | Freq: Once | INTRAVENOUS | Status: AC | PRN
  Administered 2021-05-19: 10 mL via INTRAVENOUS

## 2021-05-22 NOTE — Pre-Procedure Instructions (Signed)
Attempted to call patient regarding procedure instructions.  No answer 

## 2021-05-23 ENCOUNTER — Telehealth: Payer: Self-pay | Admitting: Cardiology

## 2021-05-23 NOTE — Telephone Encounter (Signed)
Called patient to discuss MRI results. EF has improved. Given EF>35% with only mild LV dysfunction and no scar, will cancel ICD implant. VM Left.  Sheria Lang T. Lalla Brothers, MD, [facility], [facility] Cardiac Electrophysiology

## 2021-05-24 ENCOUNTER — Telehealth: Payer: Self-pay | Admitting: Cardiology

## 2021-05-24 NOTE — Telephone Encounter (Signed)
Called to discuss MRI results. No answer, voicemail left.  Sheria Lang T. Lalla Brothers, MD, [facility], [facility] Cardiac Electrophysiology

## 2021-05-25 ENCOUNTER — Ambulatory Visit (HOSPITAL_COMMUNITY): Admission: RE | Admit: 2021-05-25 | Payer: Medicaid Other | Source: Home / Self Care | Admitting: Cardiology

## 2021-05-25 ENCOUNTER — Encounter (HOSPITAL_COMMUNITY): Admission: RE | Payer: Medicaid Other | Source: Home / Self Care

## 2021-05-25 SURGERY — SUBQ ICD IMPLANT
Anesthesia: General

## 2021-05-27 ENCOUNTER — Other Ambulatory Visit: Payer: Self-pay

## 2021-05-28 ENCOUNTER — Other Ambulatory Visit (HOSPITAL_COMMUNITY): Payer: Self-pay

## 2021-05-28 ENCOUNTER — Encounter (HOSPITAL_COMMUNITY): Payer: Self-pay

## 2021-05-28 NOTE — Progress Notes (Signed)
Today had a home visit with Molly Howard to check on her meds.  She has all her medication and she states taking them daily.  She has 90 supply of meds picked up in December except for farxiga and entresto, she has about 2 weeks left on them.  Unsure if she will be able to afford her meds when they are due, medicaid went up this year to $4 a piece and she has no income.  She is still working on getting disability.  She denies any problems today such as chest pain, headaches, dizziness or increased shortness of breath.  She has little edema in legs and abdomen is soft.  She states appetite is good and she tries to watch low sodium foods.  But what she eats is not low sodium, she does not add salt to her foods.  She staes has not used any illegal substances in a long time now and she just celebrated her birthday.  She is aware of up coming appts. Will continue to visit for heart failure, diet and medication compliance.   Earmon Phoenix [place] EMT-Paramedic [phone]

## 2021-06-04 ENCOUNTER — Ambulatory Visit: Payer: Medicaid Other

## 2021-06-09 ENCOUNTER — Telehealth: Payer: Self-pay | Admitting: Family

## 2021-06-09 NOTE — Telephone Encounter (Signed)
Patient assistance for Sherryll Burger has been approved until sept 2023 and patient should have been recieving medication but did not call it in. I requested medication be shipped to patient with a 90 day supply and notified patient.    Julee Stoll, NT

## 2021-06-10 ENCOUNTER — Other Ambulatory Visit: Payer: Self-pay

## 2021-06-12 ENCOUNTER — Encounter: Payer: Self-pay | Admitting: Internal Medicine

## 2021-06-12 ENCOUNTER — Other Ambulatory Visit: Payer: Self-pay

## 2021-06-12 ENCOUNTER — Ambulatory Visit (INDEPENDENT_AMBULATORY_CARE_PROVIDER_SITE_OTHER): Payer: Medicaid Other | Admitting: Internal Medicine

## 2021-06-12 VITALS — BP 114/62 | HR 97 | Temp 98.0°F | Resp 16 | Ht 66.5 in | Wt 184.9 lb

## 2021-06-12 DIAGNOSIS — N920 Excessive and frequent menstruation with regular cycle: Secondary | ICD-10-CM

## 2021-06-12 DIAGNOSIS — I10 Essential (primary) hypertension: Secondary | ICD-10-CM | POA: Diagnosis not present

## 2021-06-12 DIAGNOSIS — D649 Anemia, unspecified: Secondary | ICD-10-CM

## 2021-06-12 DIAGNOSIS — E1165 Type 2 diabetes mellitus with hyperglycemia: Secondary | ICD-10-CM | POA: Diagnosis not present

## 2021-06-12 DIAGNOSIS — I5020 Unspecified systolic (congestive) heart failure: Secondary | ICD-10-CM | POA: Diagnosis not present

## 2021-06-12 DIAGNOSIS — N182 Chronic kidney disease, stage 2 (mild): Secondary | ICD-10-CM

## 2021-06-12 DIAGNOSIS — Z1159 Encounter for screening for other viral diseases: Secondary | ICD-10-CM

## 2021-06-12 DIAGNOSIS — E782 Mixed hyperlipidemia: Secondary | ICD-10-CM

## 2021-06-12 MED ORDER — FAMOTIDINE 20 MG PO TABS: 20.0000 mg | ORAL_TABLET | Freq: Two times a day (BID) | ORAL | 3 refills | Status: DC

## 2021-06-12 NOTE — Progress Notes (Signed)
New Patient Office Visit  Subjective:  Patient ID: Molly Howard, female    DOB: [birth year]  Age: 36 y.o. MRN: [id number]  CC:  Chief Complaint  Patient presents with   Establish Care    HPI [place] presents as a new patient.   Hypertension/HFrEF/NICM: -Medications: Torsemide 30 once daily, Carvedilol 6.25, Spirnolactone 25, Entresto and KCl 10  -Patient is compliant with above medications and reports no side effects. -Denies any SOB, CP, vision changes, LE edema or symptoms of hypotension -Follows with Cardiology, seeing about once a month, will see them next week  -Last echo 11/22 EF 20-25%  Diabetes, Type 2: -Last A1c 6/22 11.3 -Medications: Fargixa 10, 70/30 mix 10 units BID -Patient is compliant with the above medications and reports no side effects.  -Checking BG at home: not checking  -Diet: Lost some weight, but mostly fluid. Tries to eat healthy, likely salad, fruit sometimes fried food. No added salt -Exercise: tries to walk  -Eye exam: Due  -Foot exam: Due  -Microalbumin: Due  -Statin: yes -PNA vaccine: Yes -Denies symptoms of hypoglycemia, polyuria, polydipsia, numbness extremities, foot ulcers/trauma.   HLD: -Medications: Lipitor 20 -Patient is compliant with above medications and reports no side effects.  -Last lipid panel: 1/22 Lipid Panel     Component Value Date/Time   CHOL 212 (H) 05/21/2020 0643   TRIG 121 05/21/2020 0643   HDL 35 (L) 05/21/2020 0643   CHOLHDL 6.1 05/21/2020 0643   VLDL 24 05/21/2020 0643   LDLCALC 153 (H) 05/21/2020 1700   GERD: -Not currently on medications  -Symptoms yesterday after eating chocolate, will take medication as needed  -Sometimes has nausea and vomiting   CKD: -Last creatinine 0.90, GFR 85  Anemia: -Last hgb 11.9 in 1/23, has heavy menstrual periods - regular periods but with large palm-sized clots, periods last >7 days. Never been on any contraception. Uncertain when last Pap was. Did have to have  transfusions in the past.   Past Medical History:  Diagnosis Date   Acid reflux    Chronic HFrEF (heart failure with reduced ejection fraction) ([place])    a. 10/2019 Echo: EF 35-40%, GrII DD; b. 05/2020 Echo: EF 20-25%, glob HK; c. 10/2020 Echo: EF 30-35%, glob HK. GrII DD, Mildly red RV fxn. Mod TR.   CKD (chronic kidney disease), stage II    Diabetes mellitus ([place])    H/O medication noncompliance    Hypertension    Microcytic anemia    NICM (nonischemic cardiomyopathy) ([place])    a. 10/2019 Echo: EF 35-40%; b. 10/2019 MV: No ischemia. Small apical defect-->breast attenuation; c. 05/2020 Echo: EF 20-25%; d. 10/2020 Echo: EF 30-35%, glob HK. GrII DD, Mildly red RV fxn. Mod TR.   Obesity    Polysubstance abuse ([place])     Past Surgical History:  Procedure Laterality Date   CHOLECYSTECTOMY     HERNIA REPAIR      Family History  Problem Relation Age of Onset   Heart failure Mother        a. onset in her 9s   Diabetes Mother    Hypertension Father    Diabetes Father     Social History   Socioeconomic History   Marital status: Single    Spouse name: Not on file   Number of children: Not on file   Years of education: Not on file   Highest education level: Not on file  Occupational History   Not on file  Tobacco Use  Smoking status: Every Day    Packs/day: 0.50    Types: Cigarettes   Smokeless tobacco: Never  Vaping Use   Vaping Use: Never used  Substance and Sexual Activity   Alcohol use: Yes    Comment: ~ 4 shots/day on weekends only   Drug use: Not Currently    Types: Cocaine    Comment: Last day of use was prior to June 2021 hosp.visit.    Sexual activity: Not Currently  Other Topics Concern   Not on file  Social History Narrative   Not on file   Social Determinants of Health   Financial Resource Strain: High Risk   Difficulty of Paying Living Expenses: Hard  Food Insecurity: Food Insecurity Present   Worried About Running Out of Food in the Last Year: Often true    Arboriculturist in the Last Year: Often true  Transportation Needs: Public librarian (Medical): Yes   Lack of Transportation (Non-Medical): Yes  Physical Activity: Not on file  Stress: Not on file  Social Connections: Not on file  Intimate Partner Violence: Not on file    ROS Review of Systems  Constitutional:  Negative for chills and fever.  Eyes:  Negative for visual disturbance.  Respiratory:  Negative for cough and shortness of breath.   Cardiovascular:  Negative for chest pain.  Gastrointestinal:  Negative for abdominal pain, nausea and vomiting.  Neurological:  Negative for dizziness and headaches.   Objective:   Today's Vitals: BP 114/62    Pulse 97    Temp 98 F (36.7 C)    Resp 16    Ht 5' 6.5" (1.689 m)    Wt 184 lb 14.4 oz (83.9 kg)    LMP 06/10/2021    SpO2 96%    BMI 29.40 kg/m   Physical Exam Constitutional:      Appearance: Normal appearance.  HENT:     Head: Normocephalic and atraumatic.  Eyes:     Conjunctiva/sclera: Conjunctivae normal.  Cardiovascular:     Rate and Rhythm: Normal rate and regular rhythm.  Pulmonary:     Effort: Pulmonary effort is normal.     Breath sounds: Normal breath sounds.  Musculoskeletal:     Right lower leg: No edema.     Left lower leg: No edema.  Skin:    General: Skin is warm and dry.  Neurological:     General: No focal deficit present.     Mental Status: She is alert. Mental status is at baseline.  Psychiatric:        Mood and Affect: Mood normal.        Behavior: Behavior normal.    Assessment & Plan:   Problem List Items Addressed This Visit       Cardiovascular and Mediastinum   HFrEF (heart failure with reduced ejection fraction) ([place])    Following with Cardiology, medications and last TTE reviewed.      Essential hypertension    Blood pressure stable today.        Endocrine   Diabetes mellitus, type II ([place]) - Primary    Uncontrolled. Will check A1c and urine  today. Will have her follow up next week to discuss changing over insulin to long acting, continue [place]. Referral to diabetic educator placed as well to help her with checking sugars.       Relevant Orders   Ambulatory referral to Ophthalmology   CBC w/Diff/Platelet   COMPLETE METABOLIC PANEL  WITH GFR   HgB A1c   Ambulatory referral to diabetic education   Urine Microalbumin w/creat. ratio     Genitourinary   Chronic kidney disease (CKD), stage II (mild)    Stable, reviewed labs. Recheck blood work today.      Relevant Orders   COMPLETE METABOLIC PANEL WITH GFR     Other   Chronic anemia    Last hgb stable after pRBCs, recheck labs today. Patient states anemia is due to history of menorrhagia, never been seen for this, referral to gynecology placed.       Relevant Orders   CBC w/Diff/Platelet   HLD (hyperlipidemia)    Stable, continue statin, recheck today.      Relevant Orders   Lipid Profile   Other Visit Diagnoses     Menorrhagia with regular cycle    - contributing to symptomatic anemia in the past.   Relevant Orders   Ambulatory referral to Gynecology   Need for hepatitis C screening test       Relevant Orders   Hepatitis C Antibody       Outpatient Encounter Medications as of 06/12/2021  Medication Sig   acetaminophen (TYLENOL) 500 MG tablet Take 1,000 mg by mouth every 6 (six) hours as needed (for pain.).   atorvastatin (LIPITOR) 20 MG tablet TAKE ONE TABLET BY MOUTH EVERY DAY.   blood glucose meter kit and supplies KIT Dispense based on patient and insurance preference. Use up to four times daily as directed. (FOR ICD-9 250.00, 250.01).   carvedilol (COREG) 6.25 MG tablet Take 1 tablet (6.25 mg total) by mouth 2 (two) times daily with a meal.   dapagliflozin propanediol (FARXIGA) 10 MG TABS tablet Take 1 tablet (10 mg total) by mouth once daily.   insulin aspart protamine- aspart (NOVOLOG MIX 70/30) (70-30) 100 UNIT/ML injection Inject 0.1 mLs (10 Units  total) into the skin 2 (two) times daily with a meal.   potassium chloride SA (KLOR-CON M) 20 MEQ tablet Take 0.5 tablets (10 mEq total) by mouth daily.   sacubitril-valsartan (ENTRESTO) 49-51 MG Take 1 tablet by mouth 2 (two) times daily.   spironolactone (ALDACTONE) 25 MG tablet Take 1 tablet (25 mg total) by mouth once daily.   torsemide (DEMADEX) 20 MG tablet Take 20 mg by mouth 2 (two) times daily.   [DISCONTINUED] Potassium Chloride 40 MEQ/15ML (20%) SOLN Take 40 mEq by mouth 2 (two) times daily. (Patient not taking: Reported on 10/11/2020)   No facility-administered encounter medications on file as of 06/12/2021.    Follow-up: Return in about 1 week (around 06/19/2021).   [name], DO

## 2021-06-12 NOTE — Assessment & Plan Note (Signed)
Following with Cardiology, medications and last TTE reviewed.

## 2021-06-12 NOTE — Assessment & Plan Note (Signed)
Blood pressure stable today

## 2021-06-12 NOTE — Patient Instructions (Addendum)
It was great seeing you today!  Plan discussed at today's visit: -Blood work ordered today, results will be uploaded to MyChart.  -Referrals placed for eye doctor and gynecology as well as diabetic educator  -Acid reflux medications sent to pharmacy   Follow up in: 1 week  Take care and let us know if you have any questions or concerns prior to your next visit.  Dr. Caralee Ates

## 2021-06-12 NOTE — Assessment & Plan Note (Signed)
Last hgb stable after pRBCs, recheck labs today. Patient states anemia is due to history of menorrhagia, never been seen for this, referral to gynecology placed.

## 2021-06-12 NOTE — Assessment & Plan Note (Signed)
Uncontrolled. Will check A1c and urine today. Will have her follow up next week to discuss changing over insulin to long acting, continue [place]. Referral to diabetic educator placed as well to help her with checking sugars.

## 2021-06-12 NOTE — Assessment & Plan Note (Signed)
Stable, continue statin, recheck today.

## 2021-06-12 NOTE — Assessment & Plan Note (Signed)
Stable, reviewed labs. Recheck blood work today.

## 2021-06-15 ENCOUNTER — Emergency Department
Admission: EM | Admit: 2021-06-15 | Discharge: 2021-06-15 | Disposition: A | Discharge status: Home / Self Care | Payer: Medicaid Other | Attending: Emergency Medicine | Admitting: Emergency Medicine

## 2021-06-15 ENCOUNTER — Other Ambulatory Visit: Payer: Self-pay

## 2021-06-15 DIAGNOSIS — I5042 Chronic combined systolic (congestive) and diastolic (congestive) heart failure: Secondary | ICD-10-CM | POA: Insufficient documentation

## 2021-06-15 DIAGNOSIS — I130 Hypertensive heart and chronic kidney disease with heart failure and stage 1 through stage 4 chronic kidney disease, or unspecified chronic kidney disease: Secondary | ICD-10-CM | POA: Insufficient documentation

## 2021-06-15 DIAGNOSIS — Z794 Long term (current) use of insulin: Secondary | ICD-10-CM | POA: Insufficient documentation

## 2021-06-15 DIAGNOSIS — N182 Chronic kidney disease, stage 2 (mild): Secondary | ICD-10-CM | POA: Diagnosis not present

## 2021-06-15 DIAGNOSIS — E1165 Type 2 diabetes mellitus with hyperglycemia: Secondary | ICD-10-CM | POA: Insufficient documentation

## 2021-06-15 DIAGNOSIS — E1122 Type 2 diabetes mellitus with diabetic chronic kidney disease: Secondary | ICD-10-CM | POA: Insufficient documentation

## 2021-06-15 DIAGNOSIS — R739 Hyperglycemia, unspecified: Secondary | ICD-10-CM

## 2021-06-15 LAB — COMPLETE METABOLIC PANEL WITH GFR
AG Ratio: 1 (calc) (ref 1.0–2.5)
ALT: 11 U/L (ref 6–29)
AST: 16 U/L (ref 10–30)
Albumin: 2.8 g/dL — ABNORMAL LOW (ref 3.6–5.1)
Alkaline phosphatase (APISO): 72 U/L (ref 31–125)
BUN/Creatinine Ratio: 18 (calc) (ref 6–22)
BUN: 24 mg/dL (ref 7–25)
CO2: 29 mmol/L (ref 20–32)
Calcium: 8 mg/dL — ABNORMAL LOW (ref 8.6–10.2)
Chloride: 99 mmol/L (ref 98–110)
Creat: 1.34 mg/dL — ABNORMAL HIGH (ref 0.50–0.97)
Globulin: 2.8 g/dL (calc) (ref 1.9–3.7)
Glucose, Bld: 404 mg/dL — ABNORMAL HIGH (ref 65–99)
Potassium: 4.8 mmol/L (ref 3.5–5.3)
Sodium: 136 mmol/L (ref 135–146)
Total Bilirubin: 0.3 mg/dL (ref 0.2–1.2)
Total Protein: 5.6 g/dL — ABNORMAL LOW (ref 6.1–8.1)
eGFR: 52 mL/min/{1.73_m2} — ABNORMAL LOW (ref >=60)

## 2021-06-15 LAB — MICROALBUMIN / CREATININE URINE RATIO
Creatinine, Urine: 109 mg/dL (ref 20–275)
Microalb Creat Ratio: 2905 mcg/mg creat — ABNORMAL HIGH (ref <30)
Microalb, Ur: 316.6 mg/dL

## 2021-06-15 LAB — LIPID PANEL
Cholesterol: 286 mg/dL — ABNORMAL HIGH (ref <200)
HDL: 74 mg/dL (ref >=50)
LDL Cholesterol (Calc): 171 mg/dL (calc) — ABNORMAL HIGH
Non-HDL Cholesterol (Calc): 212 mg/dL (calc) — ABNORMAL HIGH (ref <130)
Total CHOL/HDL Ratio: 3.9 (calc) (ref <5.0)
Triglycerides: 242 mg/dL — ABNORMAL HIGH (ref <150)

## 2021-06-15 LAB — CBC WITH DIFFERENTIAL/PLATELET
Absolute Monocytes: 348 cells/uL (ref 200–950)
Basophils Absolute: 110 cells/uL (ref 0–200)
Basophils Relative: 1.8 %
Eosinophils Absolute: 287 cells/uL (ref 15–500)
Eosinophils Relative: 4.7 %
HCT: 34 % — ABNORMAL LOW (ref 35.0–45.0)
Hemoglobin: 11.1 g/dL — ABNORMAL LOW (ref 11.7–15.5)
Lymphs Abs: 1318 cells/uL (ref 850–3900)
MCH: 31.1 pg (ref 27.0–33.0)
MCHC: 32.6 g/dL (ref 32.0–36.0)
MCV: 95.2 fL (ref 80.0–100.0)
MPV: 9.6 fL (ref 7.5–12.5)
Monocytes Relative: 5.7 %
Neutro Abs: 4038 cells/uL (ref 1500–7800)
Neutrophils Relative %: 66.2 %
Platelets: 469 10*3/uL — ABNORMAL HIGH (ref 140–400)
RBC: 3.57 10*6/uL — ABNORMAL LOW (ref 3.80–5.10)
RDW: 11.6 % (ref 11.0–15.0)
Total Lymphocyte: 21.6 %
WBC: 6.1 10*3/uL (ref 3.8–10.8)

## 2021-06-15 LAB — CBC
HCT: 32 % — ABNORMAL LOW (ref 36.0–46.0)
Hemoglobin: 10.3 g/dL — ABNORMAL LOW (ref 12.0–15.0)
MCH: 30.6 pg (ref 26.0–34.0)
MCHC: 32.2 g/dL (ref 30.0–36.0)
MCV: 95 fL (ref 80.0–100.0)
Platelets: 361 10*3/uL (ref 150–400)
RBC: 3.37 MIL/uL — ABNORMAL LOW (ref 3.87–5.11)
RDW: 12 % (ref 11.5–15.5)
WBC: 7 10*3/uL (ref 4.0–10.5)
nRBC: 0 % (ref 0.0–0.2)

## 2021-06-15 LAB — HEPATITIS C ANTIBODY
Hepatitis C Ab: NONREACTIVE
SIGNAL TO CUT-OFF: <0.02 (ref <1.00)

## 2021-06-15 LAB — BASIC METABOLIC PANEL
Anion gap: 5 (ref 5–15)
BUN: 20 mg/dL (ref 6–20)
CO2: 27 mmol/L (ref 22–32)
Calcium: 8.3 mg/dL — ABNORMAL LOW (ref 8.9–10.3)
Chloride: 103 mmol/L (ref 98–111)
Creatinine, Ser: 1 mg/dL (ref 0.44–1.00)
GFR, Estimated: >60 mL/min (ref >60)
Glucose, Bld: 308 mg/dL — ABNORMAL HIGH (ref 70–99)
Potassium: 4 mmol/L (ref 3.5–5.1)
Sodium: 135 mmol/L (ref 135–145)

## 2021-06-15 LAB — HEMOGLOBIN A1C
Hgb A1c MFr Bld: 10.5 % of total Hgb — ABNORMAL HIGH (ref <5.7)
Mean Plasma Glucose: 255 mg/dL
eAG (mmol/L): 14.1 mmol/L

## 2021-06-15 NOTE — ED Triage Notes (Signed)
Pt comes with c/o hypertension. Pt states she does take meds and took them today. Pt state some blurry vision.

## 2021-06-15 NOTE — ED Provider Notes (Signed)
[facility] Provider Note    Event Date/Time   First MD Initiated Contact with Patient 06/15/21 1620     (approximate)   History   Hypertension   HPI  Molly Howard is a 36 y.o. female with past medical history of diabetes, CKD, heart failure with reduced EF and hypertension who presents with concern for hypoglycemia.  Patient had routine blood work done by her doctor.  She was called today saying that her blood sugar was 400 so she should come to the emergency department.  She otherwise feels well.  Has had some lightheadedness.  Does have polyuria polydipsia.  Denies nausea vomiting abdominal pain fevers chills shortness of breath or chest pain.  She takes insulin 70/30 has been taking this.  She notes that her blood sugars have been on the lower side at home running around 100.    Past Medical History:  Diagnosis Date   Acid reflux    Chronic HFrEF (heart failure with reduced ejection fraction) ([place])    a. 10/2019 Echo: EF 35-40%, GrII DD; b. 05/2020 Echo: EF 20-25%, glob HK; c. 10/2020 Echo: EF 30-35%, glob HK. GrII DD, Mildly red RV fxn. Mod TR.   CKD (chronic kidney disease), stage II    Diabetes mellitus ([place])    H/O medication noncompliance    Hypertension    Microcytic anemia    NICM (nonischemic cardiomyopathy) ([place])    a. 10/2019 Echo: EF 35-40%; b. 10/2019 MV: No ischemia. Small apical defect-->breast attenuation; c. 05/2020 Echo: EF 20-25%; d. 10/2020 Echo: EF 30-35%, glob HK. GrII DD, Mildly red RV fxn. Mod TR.   Obesity    Polysubstance abuse [facility])     Patient Active Problem List   Diagnosis Date Noted   HLD (hyperlipidemia) 11/27/2020   Type II diabetes mellitus with renal manifestations ([place]) 11/27/2020   Chronic kidney disease (CKD), stage II (mild) 11/27/2020   Iron deficiency anemia 11/27/2020   Hypomagnesemia 10/17/2020   Anasarca 10/11/2020   Dyspnea 05/21/2020   Elevated troponin 05/20/2020   Essential hypertension 05/20/2020    Diabetes mellitus, type II ([place]) 05/20/2020   Chronic anemia 05/20/2020   Reactive thrombocytosis 05/20/2020   Obesity (BMI 30-39.9) 05/20/2020   Chronic combined systolic and diastolic heart failure ([place]) 11/01/2019   Financial difficulties 11/01/2019   Medication management 11/01/2019   Lung nodule 11/01/2019   NICM (nonischemic cardiomyopathy) ([place]) 11/01/2019   Polysubstance abuse ([place]) 11/01/2019   Tobacco use 11/01/2019   HFrEF (heart failure with reduced ejection fraction) ([facility])    Swelling      Physical Exam  Triage Vital Signs: ED Triage Vitals [06/15/21 1611]  Enc Vitals Group     BP      Pulse      Resp      Temp      Temp src      SpO2      Weight      Height      Head Circumference      Peak Flow      Pain Score 6     Pain Loc      Pain Edu?      Excl. in [place]?     Most recent vital signs: Vitals:   06/15/21 1637  BP: 135/90  Pulse: 84  Resp: 16  Temp: 99.3 F (37.4 C)  SpO2: 100%     General: Awake, no distress.  CV:  Good peripheral perfusion.  Resp:  Normal  effort.  Abd:  No distention.  Neuro:             Awake, Alert, Oriented x 3  Other:     ED Results / Procedures / Treatments  Labs (all labs ordered are listed, but only abnormal results are displayed) Labs Reviewed  CBC - Abnormal; Notable for the following components:      Result Value   RBC 3.37 (*)    Hemoglobin 10.3 (*)    HCT 32.0 (*)    All other components within normal limits  BASIC METABOLIC PANEL - Abnormal; Notable for the following components:   Glucose, Bld 308 (*)    Calcium 8.3 (*)    All other components within normal limits     EKG     RADIOLOGY    PROCEDURES:  Critical Care performed: No     MEDICATIONS ORDERED IN ED: Medications - No data to display   IMPRESSION / MDM / [place] / ED COURSE  I reviewed the triage vital signs and the nursing notes.                              Differential diagnosis includes, but is not  limited to, DKA, HHS, asymptomatic hyperglycemia  Patient is a 36 year old female with known diabetes who presents with an elevated blood sugar on routine blood work from 2 days ago.  Her primary care doctor told her to come to the ED because her sugar was 400 on a BMP.  Patient has had some mild lightheadedness but overall is minimally symptomatic.  She has no nausea vomiting, pain fevers.  Her vital signs are within normal limits.  I reviewed her labs, blood sugar was 300, there is no anion gap to suggest DKA.  Patient does not appear dehydrated.  No mental status change.  No indication for treatment at this time.  I encouraged her to continue taking her insulin and to follow-up with her primary care provider to gust any necessary changes in medicines.        FINAL CLINICAL IMPRESSION(S) / ED DIAGNOSES   Final diagnoses:  Hyperglycemia     Rx / DC Orders   ED Discharge Orders     None        Note:  This document was prepared using Dragon voice recognition software and may include unintentional dictation errors.   Rada Hay, MD 06/15/21 1904

## 2021-06-15 NOTE — Discharge Instructions (Signed)
Your blood sugar was elevated to 300 today but you did not show any signs of being in diabetic ketoacidosis.  Please follow-up with your primary care provider for management of your blood sugar.  Please continue to take your insulin as prescribed.

## 2021-06-18 ENCOUNTER — Ambulatory Visit (INDEPENDENT_AMBULATORY_CARE_PROVIDER_SITE_OTHER): Payer: Medicaid Other | Admitting: Internal Medicine

## 2021-06-18 ENCOUNTER — Other Ambulatory Visit: Payer: Self-pay

## 2021-06-18 ENCOUNTER — Encounter: Payer: Self-pay | Admitting: Internal Medicine

## 2021-06-18 VITALS — BP 138/82 | HR 89 | Temp 98.2°F | Resp 16 | Ht 66.5 in | Wt 190.2 lb

## 2021-06-18 DIAGNOSIS — I5020 Unspecified systolic (congestive) heart failure: Secondary | ICD-10-CM | POA: Diagnosis not present

## 2021-06-18 DIAGNOSIS — E1129 Type 2 diabetes mellitus with other diabetic kidney complication: Secondary | ICD-10-CM

## 2021-06-18 MED ORDER — LANTUS SOLOSTAR 100 UNIT/ML ~~LOC~~ SOPN: 10.0000 [IU] | PEN_INJECTOR | Freq: Every day | SUBCUTANEOUS | 99 refills | Status: DC

## 2021-06-18 NOTE — Progress Notes (Signed)
Established Patient Office Visit  Subjective:  Patient ID: Molly Howard, female    DOB: 04-03-1986  Age: 36 y.o. MRN: [id number]  CC:  Chief Complaint  Patient presents with   Follow-up   Diabetes   Hypertension   Hyperlipidemia    HPI Molly Howard presents for follow up on DM.  Diabetes, Type ? -First diagnosed when she was a kid and told she was a type 1, but then told she was a type 2 later in life so uncertain  -Last I1W 4/31 10.5 -Medications: Fargixa 10, Novolin 70/30 mix 10 units BID -Patient is compliant with the above medications and reports no side effects.  -Checking BG at home: fasting yesterday 200's, did go low to the 90's a few months ago   -Diet: fried eggs in the morning with bacon about 9:30, lunch sandwich with fruit about 1 pm and dinner 6-9 salads or fried foods. Eats at home a majority of the time. Snacks a lot with potato chips, sometimes candy  -Exercise: tries to walk but gets short of breath with the heart failure  -Eye exam: April 22 scheduled  -Foot exam: Today  -Microalbumin: elevated, on Entresto and Farxiga  -Statin: yes -PNA vaccine: Prevnar 23 in 7/22 -Denies symptoms of hypoglycemia, polyuria, polydipsia, numbness extremities, foot ulcers/trauma.    Past Medical History:  Diagnosis Date   Acid reflux    Chronic HFrEF (heart failure with reduced ejection fraction) ([place])    a. 10/2019 Echo: EF 35-40%, GrII DD; b. 05/2020 Echo: EF 20-25%, glob HK; c. 10/2020 Echo: EF 30-35%, glob HK. GrII DD, Mildly red RV fxn. Mod TR.   CKD (chronic kidney disease), stage II    Diabetes mellitus ([place])    H/O medication noncompliance    Hypertension    Microcytic anemia    NICM (nonischemic cardiomyopathy) ([place])    a. 10/2019 Echo: EF 35-40%; b. 10/2019 MV: No ischemia. Small apical defect-->breast attenuation; c. 05/2020 Echo: EF 20-25%; d. 10/2020 Echo: EF 30-35%, glob HK. GrII DD, Mildly red RV fxn. Mod TR.   Obesity    Polysubstance abuse ([place])     Past  Surgical History:  Procedure Laterality Date   CHOLECYSTECTOMY     HERNIA REPAIR      Family History  Problem Relation Age of Onset   Heart failure Mother        a. onset in her 3s   Diabetes Mother    Hypertension Father    Diabetes Father     Social History   Socioeconomic History   Marital status: Single    Spouse name: Not on file   Number of children: Not on file   Years of education: Not on file   Highest education level: Not on file  Occupational History   Not on file  Tobacco Use   Smoking status: Every Day    Packs/day: 0.50    Types: Cigarettes   Smokeless tobacco: Never  Vaping Use   Vaping Use: Never used  Substance and Sexual Activity   Alcohol use: Yes    Comment: ~ 4 shots/day on weekends only   Drug use: Not Currently    Types: Cocaine    Comment: Last day of use was prior to June 2021 hosp.visit.    Sexual activity: Not Currently  Other Topics Concern   Not on file  Social History Narrative   Not on file   Social Determinants of Health   Financial Resource Strain: Not  on file  Food Insecurity: Not on file  Transportation Needs: Not on file  Physical Activity: Not on file  Stress: Not on file  Social Connections: Not on file  Intimate Partner Violence: Not on file    Outpatient Medications Prior to Visit  Medication Sig Dispense Refill   acetaminophen (TYLENOL) 500 MG tablet Take 1,000 mg by mouth every 6 (six) hours as needed (for pain.).     atorvastatin (LIPITOR) 20 MG tablet TAKE ONE TABLET BY MOUTH EVERY DAY. 90 tablet 3   blood glucose meter kit and supplies KIT Dispense based on patient and insurance preference. Use up to four times daily as directed. (FOR ICD-9 250.00, 250.01). 1 each 0   carvedilol (COREG) 6.25 MG tablet Take 1 tablet (6.25 mg total) by mouth 2 (two) times daily with a meal. 180 tablet 3   dapagliflozin propanediol (FARXIGA) 10 MG TABS tablet Take 1 tablet (10 mg total) by mouth once daily. 90 tablet 3    famotidine (PEPCID) 20 MG tablet Take 1 tablet (20 mg total) by mouth 2 (two) times daily. 90 tablet 3   insulin aspart protamine- aspart (NOVOLOG MIX 70/30) (70-30) 100 UNIT/ML injection Inject 0.1 mLs (10 Units total) into the skin 2 (two) times daily with a meal. 10 mL 11   potassium chloride SA (KLOR-CON M) 20 MEQ tablet Take 0.5 tablets (10 mEq total) by mouth daily. 45 tablet 3   sacubitril-valsartan (ENTRESTO) 49-51 MG Take 1 tablet by mouth 2 (two) times daily. 60 tablet 11   spironolactone (ALDACTONE) 25 MG tablet Take 1 tablet (25 mg total) by mouth once daily. 90 tablet 3   torsemide (DEMADEX) 20 MG tablet Take 20 mg by mouth 2 (two) times daily.     No facility-administered medications prior to visit.    Allergies  Allergen Reactions   No [facility] Food Allergies Rash and Other (See Comments)    Lemons    ROS Review of Systems  Constitutional:  Negative for chills and fever.  Gastrointestinal:  Negative for diarrhea, nausea and vomiting.  Endocrine: Negative for polyuria.  Skin: Negative.   Neurological:  Positive for numbness.     Objective:    Physical Exam Constitutional:      Appearance: Normal appearance.  HENT:     Head: Normocephalic and atraumatic.  Eyes:     Conjunctiva/sclera: Conjunctivae normal.  Cardiovascular:     Rate and Rhythm: Normal rate and regular rhythm.     Pulses:          Dorsalis pedis pulses are 2+ on the right side and 2+ on the left side.  Pulmonary:     Effort: Pulmonary effort is normal.     Breath sounds: Normal breath sounds.  Musculoskeletal:     Right lower leg: No edema.     Left lower leg: No edema.     Right foot: Normal range of motion. No deformity, bunion, Charcot foot, foot drop or prominent metatarsal heads.     Left foot: Normal range of motion. No deformity, bunion, Charcot foot, foot drop or prominent metatarsal heads.  Feet:     Right foot:     Protective Sensation: 6 sites tested.  6 sites sensed.     Skin  integrity: Dry skin present.     Toenail Condition: Right toenails are abnormally thick.     Left foot:     Protective Sensation: 6 sites tested.  6 sites sensed.     Skin integrity:  Dry skin present.     Toenail Condition: Left toenails are abnormally thick.  Skin:    General: Skin is warm and dry.  Neurological:     General: No focal deficit present.     Mental Status: She is alert. Mental status is at baseline.  Psychiatric:        Mood and Affect: Mood normal.        Behavior: Behavior normal.    BP 138/82    Pulse 89    Temp 98.2 F (36.8 C) (Oral)    Resp 16    Ht 5' 6.5" (1.689 m)    Wt 190 lb 3.2 oz (86.3 kg)    LMP 06/10/2021    SpO2 99%    BMI 30.24 kg/m  Wt Readings from Last 3 Encounters:  06/15/21 185 lb 3 oz (84 kg)  06/12/21 184 lb 14.4 oz (83.9 kg)  05/28/21 181 lb (82.1 kg)     Health Maintenance Due  Topic Date Due   FOOT EXAM  Never done   OPHTHALMOLOGY EXAM  Never done   PAP SMEAR-Modifier  Never done    There are no preventive care reminders to display for this patient.  Lab Results  Component Value Date   TSH 3.286 10/02/2019   Lab Results  Component Value Date   WBC 7.0 06/15/2021   HGB 10.3 (L) 06/15/2021   HCT 32.0 (L) 06/15/2021   MCV 95.0 06/15/2021   PLT 361 06/15/2021   Lab Results  Component Value Date   NA 135 06/15/2021   K 4.0 06/15/2021   CO2 27 06/15/2021   GLUCOSE 308 (H) 06/15/2021   BUN 20 06/15/2021   CREATININE 1.00 06/15/2021   BILITOT 0.3 06/12/2021   ALKPHOS 70 05/21/2020   AST 16 06/12/2021   ALT 11 06/12/2021   PROT 5.6 (L) 06/12/2021   ALBUMIN 1.2 (L) 05/21/2020   CALCIUM 8.3 (L) 06/15/2021   ANIONGAP 5 06/15/2021   EGFR 52 (L) 06/12/2021   Lab Results  Component Value Date   CHOL 286 (H) 06/12/2021   Lab Results  Component Value Date   HDL 74 06/12/2021   Lab Results  Component Value Date   LDLCALC 171 (H) 06/12/2021   Lab Results  Component Value Date   TRIG 242 (H) 06/12/2021   Lab  Results  Component Value Date   CHOLHDL 3.9 06/12/2021   Lab Results  Component Value Date   HGBA1C 10.5 (H) 06/12/2021      Assessment & Plan:   Problem List Items Addressed This Visit       Cardiovascular and Mediastinum   HFrEF (heart failure with reduced ejection fraction) ([place])   Relevant Orders   Ambulatory referral to Physical Therapy     Endocrine   Diabetes mellitus, type II ([place]) - Primary    A1c 10.5, switch from 70/30 insulin to Lantus 10 units daily, continue [place]. Discussed checking blood sugars twice daily and bringing these to follow up. Will call in 2 weeks with sugar updates and follow up in 1 month. Referral to diabetic education, podiatry. Ophthalmology exam scheduled. Uncertain if type 1 or 2, will investigate further with next set of labs. The patient is seeking out disability, referral to PT placed for evaluation.       Relevant Medications   insulin glargine (LANTUS SOLOSTAR) 100 UNIT/ML Solostar Pen   Other Relevant Orders   Ambulatory referral to Podiatry   Ambulatory referral to Physical Therapy  Meds ordered this encounter  Medications   insulin glargine (LANTUS SOLOSTAR) 100 UNIT/ML Solostar Pen    Sig: Inject 10 Units into the skin daily.    Dispense:  15 mL    Refill:  PRN    Follow-up: Return in about 4 weeks (around 07/16/2021).    [name], DO

## 2021-06-18 NOTE — Assessment & Plan Note (Addendum)
A1c 10.5, switch from 70/30 insulin to Lantus 10 units daily, continue [place]. Discussed checking blood sugars twice daily and bringing these to follow up. Will call in 2 weeks with sugar updates and follow up in 1 month. Referral to diabetic education, podiatry. Ophthalmology exam scheduled. Uncertain if type 1 or 2, will investigate further with next set of labs. The patient is seeking out disability, referral to PT placed for evaluation.

## 2021-06-18 NOTE — Patient Instructions (Signed)
It was great seeing you today!  Plan discussed at today's visit: -Continue Farxiga, change to long acting insulin Lantus 10 units in the morning every day -Start checking blood sugar in the morning before you eat and 2 hours after your biggest meal, write these months  -Diabetic educator referral placed, please work on decreasing sugars and carbs in the diet -Referral to podiatry placed   Follow up in: 1 month   Take care and let us know if you have any questions or concerns prior to your next visit.  Dr. Caralee Ates

## 2021-06-22 ENCOUNTER — Other Ambulatory Visit (HOSPITAL_COMMUNITY)
Admission: RE | Admit: 2021-06-22 | Discharge: 2021-06-22 | Disposition: A | Discharge status: Home / Self Care | Payer: Medicaid Other | Source: Ambulatory Visit | Attending: Obstetrics and Gynecology | Admitting: Obstetrics and Gynecology

## 2021-06-22 ENCOUNTER — Encounter: Payer: Self-pay | Admitting: Obstetrics and Gynecology

## 2021-06-22 ENCOUNTER — Other Ambulatory Visit: Payer: Self-pay

## 2021-06-22 ENCOUNTER — Ambulatory Visit (INDEPENDENT_AMBULATORY_CARE_PROVIDER_SITE_OTHER): Payer: Medicaid Other | Admitting: Obstetrics and Gynecology

## 2021-06-22 VITALS — BP 134/80 | Ht 67.0 in | Wt 196.0 lb

## 2021-06-22 DIAGNOSIS — Z124 Encounter for screening for malignant neoplasm of cervix: Secondary | ICD-10-CM | POA: Insufficient documentation

## 2021-06-22 DIAGNOSIS — Z1151 Encounter for screening for human papillomavirus (HPV): Secondary | ICD-10-CM | POA: Insufficient documentation

## 2021-06-22 DIAGNOSIS — D500 Iron deficiency anemia secondary to blood loss (chronic): Secondary | ICD-10-CM

## 2021-06-22 DIAGNOSIS — N920 Excessive and frequent menstruation with regular cycle: Secondary | ICD-10-CM | POA: Insufficient documentation

## 2021-06-22 DIAGNOSIS — Z0000 Encounter for general adult medical examination without abnormal findings: Secondary | ICD-10-CM | POA: Diagnosis not present

## 2021-06-22 NOTE — Progress Notes (Signed)
[name], DO   Chief Complaint  Patient presents with   Menorrhagia    Severe cramping    HPI:      [name] is a 36 y.o. No obstetric history on file. whose LMP was Patient's last menstrual period was 06/10/2021 (approximate)., presents today for NP eval of menorrhagia, referred by PCP. Menses are monthly, lasting 7 days, changing pad/tampon every 2-3 hrs, with dime to quarter sized clots, with severe dysmen, can't take NSAIDs due to hx of heart failure needing pacemaker. No BTB. Periods worsening the past few yrs. Hx of IDA on 2/23 labs with PCP. Not taking Fe supp. No vag/urin sx. Hasn't been on BC in past. Hasn't had GYN u/s.  She is sex active with female partner, no recent pap smear.  Has also noticed a LT breast mass since puberty, getting larger. Has occas sharp pains, not related to cycle. Drinks little caffeine. No FH breast/ovar ca. No prior breast imaging.   Patient Active Problem List   Diagnosis Date Noted   Menorrhagia with regular cycle 06/22/2021   HLD (hyperlipidemia) 11/27/2020   Type II diabetes mellitus with renal manifestations ([place]) 11/27/2020   Chronic kidney disease (CKD), stage II (mild) 11/27/2020   Iron deficiency anemia 11/27/2020   Hypomagnesemia 10/17/2020   Anasarca 10/11/2020   Dyspnea 05/21/2020   Elevated troponin 05/20/2020   Essential hypertension 05/20/2020   Diabetes mellitus, type II ([place]) 05/20/2020   Chronic anemia 05/20/2020   Reactive thrombocytosis 05/20/2020   Obesity (BMI 30-39.9) 05/20/2020   Chronic combined systolic and diastolic heart failure ([place]) 11/01/2019   Financial difficulties 11/01/2019   Medication management 11/01/2019   Lung nodule 11/01/2019   NICM (nonischemic cardiomyopathy) ([place]) 11/01/2019   Polysubstance abuse ([place]) 11/01/2019   Tobacco use 11/01/2019   HFrEF (heart failure with reduced ejection fraction) ([facility])    Swelling     Past Surgical History:  Procedure Laterality Date    CHOLECYSTECTOMY     HERNIA REPAIR      Family History  Problem Relation Age of Onset   Heart failure Mother        a. onset in her 54s   Diabetes Mother    Hypertension Father    Diabetes Father     Social History   Socioeconomic History   Marital status: Single    Spouse name: Not on file   Number of children: Not on file   Years of education: Not on file   Highest education level: Not on file  Occupational History   Not on file  Tobacco Use   Smoking status: Every Day    Packs/day: 0.50    Types: Cigarettes   Smokeless tobacco: Never  Vaping Use   Vaping Use: Never used  Substance and Sexual Activity   Alcohol use: Yes    Comment: ~ 4 shots/day on weekends only   Drug use: Not Currently    Types: Cocaine    Comment: Last day of use was prior to June 2021 hosp.visit.    Sexual activity: Not Currently    Birth control/protection: None  Other Topics Concern   Not on file  Social History Narrative   Not on file   Social Determinants of Health   Financial Resource Strain: Not on file  Food Insecurity: Not on file  Transportation Needs: Not on file  Physical Activity: Not on file  Stress: Not on file  Social Connections: Not on file  Intimate Partner Violence:  Not on file    Outpatient Medications Prior to Visit  Medication Sig Dispense Refill   acetaminophen (TYLENOL) 500 MG tablet Take 1,000 mg by mouth every 6 (six) hours as needed (for pain.).     atorvastatin (LIPITOR) 20 MG tablet TAKE ONE TABLET BY MOUTH EVERY DAY. 90 tablet 3   blood glucose meter kit and supplies KIT Dispense based on patient and insurance preference. Use up to four times daily as directed. (FOR ICD-9 250.00, 250.01). 1 each 0   carvedilol (COREG) 6.25 MG tablet Take 1 tablet (6.25 mg total) by mouth 2 (two) times daily with a meal. 180 tablet 3   dapagliflozin propanediol (FARXIGA) 10 MG TABS tablet Take 1 tablet (10 mg total) by mouth once daily. 90 tablet 3   famotidine (PEPCID) 20  MG tablet Take 1 tablet (20 mg total) by mouth 2 (two) times daily. 90 tablet 3   insulin glargine (LANTUS SOLOSTAR) 100 UNIT/ML Solostar Pen Inject 10 Units into the skin daily. 15 mL PRN   potassium chloride SA (KLOR-CON M) 20 MEQ tablet Take 0.5 tablets (10 mEq total) by mouth daily. 45 tablet 3   sacubitril-valsartan (ENTRESTO) 49-51 MG Take 1 tablet by mouth 2 (two) times daily. 60 tablet 11   spironolactone (ALDACTONE) 25 MG tablet Take 1 tablet (25 mg total) by mouth once daily. 90 tablet 3   torsemide (DEMADEX) 20 MG tablet Take 20 mg by mouth 2 (two) times daily.     No facility-administered medications prior to visit.      ROS:  Review of Systems  Constitutional:  Negative for fever.  Gastrointestinal:  Negative for blood in stool, constipation, diarrhea, nausea and vomiting.  Genitourinary:  Positive for menstrual problem. Negative for dyspareunia, dysuria, flank pain, frequency, hematuria, urgency, vaginal bleeding, vaginal discharge and vaginal pain.  Musculoskeletal:  Negative for back pain.  Skin:  Negative for rash.  BREAST: mass   OBJECTIVE:   Vitals:  BP 134/80    Ht '5\' 7"'  (1.702 m)    Wt 196 lb (88.9 kg)    LMP 06/10/2021 (Approximate)    BMI 30.70 kg/m   Physical Exam Vitals reviewed.  Constitutional:      Appearance: She is well-developed.  Pulmonary:     Effort: Pulmonary effort is normal.  Chest:  Breasts:    Breasts are symmetrical.     Right: No inverted nipple, mass, nipple discharge, skin change or tenderness.     Left: No inverted nipple, mass, nipple discharge, skin change or tenderness.     Comments: NEG BREAST EXAM; AREA OF "BREAST MASS" IS NORMAL TISSUE; NO DISCRETE MASS Genitourinary:    General: Normal vulva.     Pubic Area: No rash.      Labia:        Right: No rash, tenderness or lesion.        Left: No rash, tenderness or lesion.      Vagina: Normal. No vaginal discharge, erythema or tenderness.     Cervix: Normal.     Uterus:  Normal. Not enlarged and not tender.      Adnexa: Right adnexa normal and left adnexa normal.       Right: No mass or tenderness.         Left: No mass or tenderness.    Musculoskeletal:        General: Normal range of motion.     Cervical back: Normal range of motion.  Skin:    General:  Skin is warm and dry.  Neurological:     General: No focal deficit present.     Mental Status: She is alert and oriented to person, place, and time.     Cranial Nerves: No cranial nerve deficit.  Psychiatric:        Mood and Affect: Mood normal.        Behavior: Behavior normal.        Thought Content: Thought content normal.        Judgment: Judgment normal.    Assessment/Plan: Menorrhagia with regular cycle - Plan: US PELVIC COMPLETE WITH TRANSVAGINAL; check pap/GYN u/s. Will f/u with results and tx options based on results. Pt is most likely candidate for prog options, but would want to discuss with cardiology beforehand. Pt to add Fe supp.   Iron deficiency anemia due to chronic blood loss--pt to start Slow Fe supp OTC.   Cervical cancer screening - Plan: Cytology - PAP  Screening for HPV (human papillomavirus) - Plan: Cytology - PAP  Normal breast exam--area of concern is normal breast tissue   Return if symptoms worsen or fail to improve.  Amyjo Mizrachi B. Damiel Barthold, PA-C 06/22/2021 3:19 PM

## 2021-06-22 NOTE — Patient Instructions (Signed)
I value your feedback and you entrusting us with your care. If you get a [facility] patient survey, I would appreciate you taking the time to let us know about your experience today. Thank you! ? ? ?

## 2021-06-25 ENCOUNTER — Ambulatory Visit: Payer: Medicaid Other | Admitting: *Deleted

## 2021-06-25 LAB — CYTOLOGY - PAP
Adequacy: ABSENT
Comment: NEGATIVE
Diagnosis: NEGATIVE
High risk HPV: NEGATIVE

## 2021-06-26 ENCOUNTER — Ambulatory Visit (INDEPENDENT_AMBULATORY_CARE_PROVIDER_SITE_OTHER): Payer: Medicaid Other | Admitting: Podiatry

## 2021-06-26 ENCOUNTER — Other Ambulatory Visit: Payer: Self-pay

## 2021-06-26 VITALS — BP 131/77 | HR 78 | Temp 98.7°F | Resp 20

## 2021-06-26 DIAGNOSIS — L989 Disorder of the skin and subcutaneous tissue, unspecified: Secondary | ICD-10-CM

## 2021-06-26 DIAGNOSIS — L0889 Other specified local infections of the skin and subcutaneous tissue: Secondary | ICD-10-CM

## 2021-06-26 DIAGNOSIS — B351 Tinea unguium: Secondary | ICD-10-CM

## 2021-06-26 DIAGNOSIS — E0843 Diabetes mellitus due to underlying condition with diabetic autonomic (poly)neuropathy: Secondary | ICD-10-CM

## 2021-06-26 DIAGNOSIS — M79674 Pain in right toe(s): Secondary | ICD-10-CM

## 2021-06-26 DIAGNOSIS — M79675 Pain in left toe(s): Secondary | ICD-10-CM

## 2021-06-29 ENCOUNTER — Ambulatory Visit: Admission: RE | Admit: 2021-06-29 | Payer: Medicaid Other | Source: Ambulatory Visit

## 2021-06-30 ENCOUNTER — Ambulatory Visit (INDEPENDENT_AMBULATORY_CARE_PROVIDER_SITE_OTHER): Payer: Medicaid Other

## 2021-06-30 ENCOUNTER — Other Ambulatory Visit (HOSPITAL_COMMUNITY): Payer: Self-pay

## 2021-06-30 ENCOUNTER — Other Ambulatory Visit: Payer: Self-pay

## 2021-06-30 DIAGNOSIS — I428 Other cardiomyopathies: Secondary | ICD-10-CM | POA: Diagnosis not present

## 2021-06-30 LAB — ECHOCARDIOGRAM COMPLETE
AR max vel: 2.11 cm2
AV Area VTI: 2.14 cm2
AV Area mean vel: 2.11 cm2
AV Mean grad: 5 mmHg
AV Peak grad: 9.5 mmHg
Ao pk vel: 1.54 m/s
Area-P 1/2: 4.29 cm2
Calc EF: 33.6 %
S' Lateral: 4.6 cm
Single Plane A2C EF: 40.5 %
Single Plane A4C EF: 27 %

## 2021-06-30 NOTE — Progress Notes (Signed)
Contacted Molly Howard to remind her of her appts today.  She states she has all her medications except her new insulin.  She states she can not afford it.  She has not heard back from her disability yet.  She has been making all her medical appts and stays in contact for her disability.  She has medicaid now and since she has insurance she is not eligible for Medication management. Contacted [name] in [place] and they will pay for this months medications so she can start her new insulin.  Will visit her at home tomorrow to go through all her medications with her.  She has been feeling much better.  Will visit for heart failure, diet and medication management.   [place] EMT-Paramedic [phone]

## 2021-07-01 ENCOUNTER — Ambulatory Visit: Payer: Medicaid Other | Admitting: *Deleted

## 2021-07-01 ENCOUNTER — Other Ambulatory Visit (HOSPITAL_COMMUNITY): Payer: Self-pay

## 2021-07-01 NOTE — Progress Notes (Signed)
Today went to her pharmacy and picked her medications up and the foundation witll pay me back for them.  They had everything ready except for her potassium, which can not be filled til March 6th.  She gets her entresto and farxiga through mail now.  Took her meds to her and explained she will have to figure out how to get her meds.  I picked up 90 day supply.  Picked her new insulin up and advised her needed to be kept refrigerated and went over instruction hw to use.  They did not supply her with the needles, contacted pharmacy and they will contact the doctor.  She staets her Mom has a few needles she could use.  Tried them and they are the right ones.  She states will start today since she has not took her other yet.  She states has everything she needs right now.  She denies any problems.  Willcontinue to visit for heart failure, diet and medication compliance.  ? ?Myles Mallicoat ?[place] EMT-Paramedic ?[phone] ?

## 2021-07-02 ENCOUNTER — Ambulatory Visit (INDEPENDENT_AMBULATORY_CARE_PROVIDER_SITE_OTHER): Payer: Medicaid Other | Admitting: Cardiology

## 2021-07-02 ENCOUNTER — Encounter: Payer: Self-pay | Admitting: Cardiology

## 2021-07-02 ENCOUNTER — Other Ambulatory Visit: Payer: Self-pay

## 2021-07-02 VITALS — BP 136/78 | HR 83 | Ht 67.0 in | Wt 188.0 lb

## 2021-07-02 DIAGNOSIS — I428 Other cardiomyopathies: Secondary | ICD-10-CM | POA: Diagnosis not present

## 2021-07-02 DIAGNOSIS — E7800 Pure hypercholesterolemia, unspecified: Secondary | ICD-10-CM | POA: Diagnosis not present

## 2021-07-02 MED ORDER — ENTRESTO 97-103 MG PO TABS: 1.0000 | ORAL_TABLET | Freq: Two times a day (BID) | ORAL | 3 refills | Status: DC

## 2021-07-02 NOTE — Progress Notes (Signed)
Cardiology Office Note:    Date:  07/02/2021   ID:  Molly Howard, DOB 11-17-1985, MRN [id number]  PCP:  [name], [facility]  Cardiologist:  [name], MD  Advanced Practice Provider:  No care team member to display Electrophysiologist:  Vickie Epley, MD       Referring MD: [name], MD   Chief Complaint  Patient presents with   Other    Follow up post ECHO -- Meds reviewed verbally with patient    History of Present Illness:    Molly Howard is a 36 y.o. female with a hx of NICM EF low as 20-25%, last EF 30-35%, hyperlipidemia, diabetes, former smoker x 38yr, polysubstance abuse, obesity presenting for follow-up.    Being seen for nonischemic cardiomyopathy and medication titration.  Previous EF 30 to 35%, however CMR performed with EF 42%.  Initial plan ICD was canceled due to improvement in ejection fraction.  Recent echocardiogram showed EF 30 to 35%.  Patient denies edema, has shortness of breath when she overexerts herself.  Tolerating medications without any adverse effects.  Takes torsemide 20 mg daily.   Prior notes CMR 05/2021 EF 42%, no evidence for scar. Echo 10/2020, EF 30 to 35%. Echo 05/20/2020 EF 20 to 25% Echo 10/2019 EF 35 to 40% Lexiscan Myoview 10/2019 no evidence for ischemia, EF 30% Mother has a history of heart failure, has a device placed not sure if pacemaker or ICD.  Past Medical History:  Diagnosis Date   Acid reflux    Chronic HFrEF (heart failure with reduced ejection fraction) ([place]    a. 10/2019 Echo: EF 35-40%, GrII DD; b. 05/2020 Echo: EF 20-25%, glob HK; c. 10/2020 Echo: EF 30-35%, glob HK. GrII DD, Mildly red RV fxn. Mod TR.   CKD (chronic kidney disease), stage II    Diabetes mellitus ([place]    H/O medication noncompliance    Hypertension    Microcytic anemia    NICM (nonischemic cardiomyopathy) ([place]    a. 10/2019 Echo: EF 35-40%; b. 10/2019 MV: No ischemia. Small apical  defect-->breast attenuation; c. 05/2020 Echo: EF 20-25%; d. 10/2020 Echo: EF 30-35%, glob HK. GrII DD, Mildly red RV fxn. Mod TR.   Obesity    Polysubstance abuse ([place]     Past Surgical History:  Procedure Laterality Date   CHOLECYSTECTOMY     HERNIA REPAIR      Current Medications: Current Meds  Medication Sig   acetaminophen (TYLENOL) 500 MG tablet Take 1,000 mg by mouth every 6 (six) hours as needed (for pain.).   atorvastatin (LIPITOR) 20 MG tablet TAKE ONE TABLET BY MOUTH EVERY DAY.   blood glucose meter kit and supplies KIT Dispense based on patient and insurance preference. Use up to four times daily as directed. (FOR ICD-9 250.00, 250.01).   carvedilol (COREG) 6.25 MG tablet Take 1 tablet (6.25 mg total) by mouth 2 (two) times daily with a meal.   dapagliflozin propanediol (FARXIGA) 10 MG TABS tablet Take 1 tablet (10 mg total) by mouth once daily.   famotidine (PEPCID) 20 MG tablet Take 1 tablet (20 mg total) by mouth 2 (two) times daily.   insulin glargine (LANTUS SOLOSTAR) 100 UNIT/ML Solostar Pen Inject 10 Units into the skin daily.   potassium chloride SA (KLOR-CON M) 20 MEQ tablet Take 0.5 tablets (10 mEq total) by mouth daily.   sacubitril-valsartan (ENTRESTO) 97-103 MG Take 1 tablet by mouth 2 (two) times  daily.   spironolactone (ALDACTONE) 25 MG tablet Take 1 tablet (25 mg total) by mouth once daily.   torsemide (DEMADEX) 20 MG tablet Take 20 mg by mouth daily.   [DISCONTINUED] Potassium Chloride 40 MEQ/15ML (20%) SOLN Take 40 mEq by mouth 2 (two) times daily.   [DISCONTINUED] sacubitril-valsartan (ENTRESTO) 49-51 MG Take 1 tablet by mouth 2 (two) times daily.     Allergies:   No healthtouch food allergies   Social History   Socioeconomic History   Marital status: Single    Spouse name: Not on file   Number of children: Not on file   Years of education: Not on file   Highest education level: Not on file  Occupational History   Not on file  Tobacco Use    Smoking status: Every Day    Packs/day: 0.50    Types: Cigarettes   Smokeless tobacco: Never  Vaping Use   Vaping Use: Never used  Substance and Sexual Activity   Alcohol use: Yes    Comment: ~ 4 shots/day on weekends only   Drug use: Not Currently    Types: Cocaine    Comment: Last day of use was prior to June 2021 hosp.visit.    Sexual activity: Not Currently    Birth control/protection: None  Other Topics Concern   Not on file  Social History Narrative   Not on file   Social Determinants of Health   Financial Resource Strain: Not on file  Food Insecurity: Not on file  Transportation Needs: Not on file  Physical Activity: Not on file  Stress: Not on file  Social Connections: Not on file     Family History: The patient's family history includes Diabetes in her father and mother; Heart failure in her mother; Hypertension in her father.  ROS:   Please see the history of present illness.     All other systems reviewed and are negative.  EKGs/Labs/Other Studies Reviewed:    The following studies were reviewed today:   EKG:  EKG not ordered today.    Recent Labs: 11/27/2020: B Natriuretic Peptide 1,043.9 11/28/2020: Magnesium 1.6 06/12/2021: ALT 11 06/15/2021: BUN 20; Creatinine, Ser 1.00; Hemoglobin 10.3; Platelets 361; Potassium 4.0; Sodium 135  Recent Lipid Panel    Component Value Date/Time   CHOL 286 (H) 06/12/2021 1354   TRIG 242 (H) 06/12/2021 1354   HDL 74 06/12/2021 1354   CHOLHDL 3.9 06/12/2021 1354   VLDL 24 05/21/2020 0643   LDLCALC 171 (H) 06/12/2021 1354     Risk Assessment/Calculations:      Physical Exam:    VS:  BP 136/78 (BP Location: Left Arm, Patient Position: Sitting, Cuff Size: Normal)    Pulse 83    Ht 5' 7" (1.702 m)    Wt 188 lb (85.3 kg)    LMP 06/10/2021 (Approximate)    SpO2 97%    BMI 29.44 kg/m     Wt Readings from Last 3 Encounters:  07/02/21 188 lb (85.3 kg)  06/22/21 196 lb (88.9 kg)  06/18/21 190 lb 3.2 oz (86.3 kg)      GEN:  Well nourished, well developed in no acute distress HEENT: Normal NECK: No JVD; No carotid bruits LYMPHATICS: No lymphadenopathy CARDIAC: RRR, no murmurs, rubs, gallops RESPIRATORY: Decreased breath sounds at bases ABDOMEN: Soft, non-tender, distended MUSCULOSKELETAL: no edema; No deformity   SKIN: Warm and dry NEUROLOGIC:  Alert and oriented x 3 PSYCHIATRIC:  Normal affect   ASSESSMENT:    1. NICM (  nonischemic cardiomyopathy) ([place])   2. Pure hypercholesterolemia     PLAN:    In order of problems listed above:  Nonischemic cardiomyopathy, EF low as 25%.  CMR 05/2021 EF 42%, echo 06/2021 EF 30 to 35%.  Lexiscan with no evidence for ischemia. Patient describes NYHA class II symptoms.  Increase Entresto to 97-103 mg twice daily, continue Coreg 6.25 mg twice daily, Aldactone 25 mg daily, torsemide 40 mg daily, Farxiga 10 mg daily.  Was previously referred for ICD, this was canceled due to CMR EF greater than 35%.  Reconsider ICD if EF stays below 35%. Hyperlipidemia, continue Lipitor 20 mg  Follow-up in 2 months for medication titration    Medication Adjustments/Labs and Tests Ordered: Current medicines are reviewed at length with the patient today.  Concerns regarding medicines are outlined above.  No orders of the defined types were placed in this encounter.     Meds ordered this encounter  Medications   sacubitril-valsartan (ENTRESTO) 97-103 MG    Sig: Take 1 tablet by mouth 2 (two) times daily.    Dispense:  180 tablet    Refill:  3     Patient Instructions  Medication Instructions:   INCREASE your Sacubitril-Valsartan (Entresto) to 97-103 MG twice a day.  *If you need a refill on your cardiac medications before your next appointment, please call your pharmacy*   Lab Work: None ordered If you have labs (blood work) drawn today and your tests are completely normal, you will receive your results only by: [place] (if you have MyChart) OR A paper  copy in the mail If you have any lab test that is abnormal or we need to change your treatment, we will call you to review the results.   Testing/Procedures: None ordered   Follow-Up: At [facility], you and your health needs are our priority.  As part of our continuing mission to provide you with exceptional heart care, we have created designated Provider Care Teams.  These Care Teams include your primary Cardiologist (physician) and Advanced Practice Providers (APPs -  Physician Assistants and Nurse Practitioners) who all work together to provide you with the care you need, when you need it.  We recommend signing up for the patient portal called "MyChart".  Sign up information is provided on this After Visit Summary.  MyChart is used to connect with patients for Virtual Visits (Telemedicine).  Patients are able to view lab/test results, encounter notes, upcoming appointments, etc.  Non-urgent messages can be sent to your provider as well.   To learn more about what you can do with MyChart, go to [email or web address].    Your next appointment:   2 month(s)  The format for your next appointment:   In Person  Provider:   [name], MD    Other Instructions     Signed, [name], MD  07/02/2021 12:12 PM    [place]

## 2021-07-02 NOTE — Patient Instructions (Signed)
Medication Instructions:  ? ?INCREASE your Sacubitril-Valsartan (Entresto) to 97-103 MG twice a day. ? ?*If you need a refill on your cardiac medications before your next appointment, please call your pharmacy* ? ? ?Lab Work: ?None ordered ?If you have labs (blood work) drawn today and your tests are completely normal, you will receive your results only by: ?MyChart Message (if you have MyChart) OR ?A paper copy in the mail ?If you have any lab test that is abnormal or we need to change your treatment, we will call you to review the results. ? ? ?Testing/Procedures: ?None ordered ? ? ?Follow-Up: ?At [facility], you and your health needs are our priority.  As part of our continuing mission to provide you with exceptional heart care, we have created designated Provider Care Teams.  These Care Teams include your primary Cardiologist (physician) and Advanced Practice Providers (APPs -  Physician Assistants and Nurse Practitioners) who all work together to provide you with the care you need, when you need it. ? ?We recommend signing up for the patient portal called "MyChart".  Sign up information is provided on this After Visit Summary.  MyChart is used to connect with patients for Virtual Visits (Telemedicine).  Patients are able to view lab/test results, encounter notes, upcoming appointments, etc.  Non-urgent messages can be sent to your provider as well.   ?To learn more about what you can do with MyChart, go to [email or web address].   ? ?Your next appointment:   ?2 month(s) ? ?The format for your next appointment:   ?In Person ? ?Provider:   ?Debbe Odea, MD  ? ? ?Other Instructions ? ? ?

## 2021-07-03 NOTE — Progress Notes (Signed)
° °  SUBJECTIVE Patient with a history of diabetes mellitus presents to office today complaining of elongated, thickened nails that cause pain while ambulating in shoes.  Patient is unable to trim their own nails. Patient is here for further evaluation and treatment.   Past Medical History:  Diagnosis Date   Acid reflux    Chronic HFrEF (heart failure with reduced ejection fraction) ([place])    a. 10/2019 Echo: EF 35-40%, GrII DD; b. 05/2020 Echo: EF 20-25%, glob HK; c. 10/2020 Echo: EF 30-35%, glob HK. GrII DD, Mildly red RV fxn. Mod TR.   CKD (chronic kidney disease), stage II    Diabetes mellitus ([place])    H/O medication noncompliance    Hypertension    Microcytic anemia    NICM (nonischemic cardiomyopathy) ([place])    a. 10/2019 Echo: EF 35-40%; b. 10/2019 MV: No ischemia. Small apical defect-->breast attenuation; c. 05/2020 Echo: EF 20-25%; d. 10/2020 Echo: EF 30-35%, glob HK. GrII DD, Mildly red RV fxn. Mod TR.   Obesity    Polysubstance abuse ([place])    Past Surgical History:  Procedure Laterality Date   CHOLECYSTECTOMY     HERNIA REPAIR     Allergies  Allergen Reactions   No Healthtouch Food Allergies Rash and Other (See Comments)    [place]    OBJECTIVE General Patient is awake, alert, and oriented x 3 and in no acute distress. Derm Skin is dry and supple bilateral. Negative open lesions or macerations. Remaining integument unremarkable. Nails are tender, long, thickened and dystrophic with subungual debris, consistent with onychomycosis, 1-5 bilateral. No signs of infection noted.  Hyperkeratotic preulcerative callus tissue also noted bilateral feet Vasc  DP and PT pedal pulses palpable bilaterally. Temperature gradient within normal limits.  Neuro Epicritic and protective threshold sensation diminished bilaterally.  Musculoskeletal Exam No symptomatic pedal deformities noted bilateral. Muscular strength within normal limits.  ASSESSMENT 1. Diabetes Mellitus w/ peripheral neuropathy 2.   Pain due to onychomycosis of toenails bilateral 3.  Preulcerative benign hyperkeratotic callus lesions bilateral  PLAN OF CARE 1. Patient evaluated today. 2. Instructed to maintain good pedal hygiene and foot care. Stressed importance of controlling blood sugar.  3. Mechanical debridement of nails 1-5 bilaterally performed using a nail nipper. Filed with dremel without incident.  4.  Excisional debridement of the hyperkeratotic preulcerative callus tissue was performed using a tissue nipper without incident or bleeding  5.  Return to clinic in 3 mos.     Edrick Kins, DPM [facility]  Dr. Edrick Kins, DPM    [address], [place] [address]                Office [phone]  Fax [phone]

## 2021-07-09 ENCOUNTER — Ambulatory Visit (INDEPENDENT_AMBULATORY_CARE_PROVIDER_SITE_OTHER): Payer: Medicaid Other

## 2021-07-09 ENCOUNTER — Other Ambulatory Visit: Payer: Self-pay | Admitting: Obstetrics and Gynecology

## 2021-07-09 ENCOUNTER — Other Ambulatory Visit: Payer: Self-pay

## 2021-07-09 DIAGNOSIS — N920 Excessive and frequent menstruation with regular cycle: Secondary | ICD-10-CM | POA: Diagnosis not present

## 2021-07-09 DIAGNOSIS — N946 Dysmenorrhea, unspecified: Secondary | ICD-10-CM

## 2021-07-14 NOTE — Progress Notes (Signed)
Pt with menometrorrhagia but not BTB/AUB. Does she need hyst/D&C for possible polyps or just if having BTB/AUB.?

## 2021-07-16 ENCOUNTER — Encounter: Payer: Self-pay | Admitting: Internal Medicine

## 2021-07-16 ENCOUNTER — Ambulatory Visit (INDEPENDENT_AMBULATORY_CARE_PROVIDER_SITE_OTHER): Payer: Medicaid Other | Admitting: Internal Medicine

## 2021-07-16 VITALS — BP 136/82 | HR 89 | Temp 98.0°F | Resp 16 | Ht 67.0 in | Wt 196.4 lb

## 2021-07-16 DIAGNOSIS — I10 Essential (primary) hypertension: Secondary | ICD-10-CM | POA: Diagnosis not present

## 2021-07-16 DIAGNOSIS — E782 Mixed hyperlipidemia: Secondary | ICD-10-CM | POA: Diagnosis not present

## 2021-07-16 DIAGNOSIS — E1129 Type 2 diabetes mellitus with other diabetic kidney complication: Secondary | ICD-10-CM | POA: Diagnosis not present

## 2021-07-16 DIAGNOSIS — I5020 Unspecified systolic (congestive) heart failure: Secondary | ICD-10-CM | POA: Diagnosis not present

## 2021-07-16 MED ORDER — "PEN NEEDLES 3/16"" 31G X 5 MM MISC": 1.0000 | Freq: Two times a day (BID) | 3 refills | Status: DC

## 2021-07-16 MED ORDER — LANTUS SOLOSTAR 100 UNIT/ML ~~LOC~~ SOPN: 6.0000 [IU] | PEN_INJECTOR | Freq: Two times a day (BID) | SUBCUTANEOUS | 99 refills | Status: DC

## 2021-07-16 MED ORDER — ATORVASTATIN CALCIUM 40 MG PO TABS: 40.0000 mg | ORAL_TABLET | Freq: Every day | ORAL | 3 refills | Status: DC

## 2021-07-16 NOTE — Progress Notes (Signed)
? ?Established Patient Office Visit ? ?Subjective:  ?Patient ID: Molly Howard, female    DOB: 06-02-1985  Age: 36 y.o. MRN: [id number] ? ?CC:  ?Chief Complaint  ?Patient presents with  ? Follow-up  ? Diabetes  ? ? ?HPI ?Molly Howard presents for follow up. ? ?Diabetes, Type uncertain ?-First diagnosed when she was a kid and told she was a type 1, but then told she was a type 2 later in life so uncertain  ?-Last I1C 3/01 10.5 ?-Medications: Fargixa 10, Lantus 10 units in the morning  ?-Patient is compliant with the above medications and reports no side effects.  ?-Checking BG at home: fasting 250-300, evening 100, had one low sugar in the 80's in the afternoon  ?-Diet: recently cut out fried foods, tries to eat fruit for snacks   ?-Exercise: tries to walk but gets short of breath with the heart failure  ?-Eye exam: April 22 scheduled  ?-Foot exam: UTD, saw Podiatrist and had nails cut  ?-Microalbumin: elevated, on Entresto and Farxiga  ?-Statin: yes ?-PNA vaccine: Prevnar 23 in 7/22 ?-Denies symptoms of hypoglycemia, polyuria, polydipsia, numbness extremities, foot ulcers/trauma.  ? ?Hypertension/HFrEF/NICM: ?-Medications: Torsemide 30 once daily, Carvedilol 6.25, Spirnolactone 25, Entresto and KCl 10  ?-Patient is compliant with above medications and reports no side effects. ?-Denies any SOB, CP, vision changes, LE edema or symptoms of hypotension ?-Follows with Cardiology, saw them last month, Entresto dose increased ?-Last echo 2/23  EF 30-35% which is improved  ? ?HLD: ?-Medications: Lipitor 20 for one year ?-Patient is compliant with above medications and reports no side effects.  ?-Last lipid panel: Lipid Panel  ?   ?Component Value Date/Time  ? CHOL 286 (H) 06/12/2021 1354  ? TRIG 242 (H) 06/12/2021 1354  ? HDL 74 06/12/2021 1354  ? CHOLHDL 3.9 06/12/2021 1354  ? VLDL 24 05/21/2020 0643  ? LDLCALC 171 (H) 06/12/2021 1354  ? ?GERD: ?-Not currently on daily medications, taking Pepcid as needed ?  ?CKD: ?-Last  creatinine 2/23 1.00, GFR >60 ?  ?Anemia: ?-Last hgb 10.3 in 2/23, has menorrhagia, referred to Gyn who did Pap and vaginal [place], awaiting follow up appointment to discuss results  ? ?Past Medical History:  ?Diagnosis Date  ? Acid reflux   ? Chronic HFrEF (heart failure with reduced ejection fraction) ([place])   ? a. 10/2019 Echo: EF 35-40%, GrII DD; b. 05/2020 Echo: EF 20-25%, glob HK; c. 10/2020 Echo: EF 30-35%, glob HK. GrII DD, Mildly red RV fxn. Mod TR.  ? CKD (chronic kidney disease), stage II   ? Diabetes mellitus ([place])   ? H/O medication noncompliance   ? Hypertension   ? Microcytic anemia   ? NICM (nonischemic cardiomyopathy) ([place])   ? a. 10/2019 Echo: EF 35-40%; b. 10/2019 MV: No ischemia. Small apical defect-->breast attenuation; c. 05/2020 Echo: EF 20-25%; d. 10/2020 Echo: EF 30-35%, glob HK. GrII DD, Mildly red RV fxn. Mod TR.  ? Obesity   ? Polysubstance abuse ([place])   ? ? ?Past Surgical History:  ?Procedure Laterality Date  ? CHOLECYSTECTOMY    ? HERNIA REPAIR    ? ? ?Family History  ?Problem Relation Age of Onset  ? Heart failure Mother   ?     a. onset in her 56s  ? Diabetes Mother   ? Hypertension Father   ? Diabetes Father   ? ? ?Social History  ? ?Socioeconomic History  ? Marital status: Single  ?  Spouse name: Not  on file  ? Number of children: Not on file  ? Years of education: Not on file  ? Highest education level: Not on file  ?Occupational History  ? Not on file  ?Tobacco Use  ? Smoking status: Every Day  ?  Packs/day: 0.50  ?  Types: Cigarettes  ? Smokeless tobacco: Never  ?Vaping Use  ? Vaping Use: Never used  ?Substance and Sexual Activity  ? Alcohol use: Yes  ?  Comment: ~ 4 shots/day on weekends only  ? Drug use: Not Currently  ?  Types: Cocaine  ?  Comment: Last day of use was prior to June 2021 hosp.visit.   ? Sexual activity: Not Currently  ?  Birth control/protection: None  ?Other Topics Concern  ? Not on file  ?Social History Narrative  ? Not on file  ? ?Social Determinants of Health   ? ?Financial Resource Strain: Not on file  ?Food Insecurity: Not on file  ?Transportation Needs: Not on file  ?Physical Activity: Not on file  ?Stress: Not on file  ?Social Connections: Not on file  ?Intimate Partner Violence: Not on file  ? ? ?Outpatient Medications Prior to Visit  ?Medication Sig Dispense Refill  ? acetaminophen (TYLENOL) 500 MG tablet Take 1,000 mg by mouth every 6 (six) hours as needed (for pain.).    ? atorvastatin (LIPITOR) 20 MG tablet TAKE ONE TABLET BY MOUTH EVERY DAY. 90 tablet 3  ? blood glucose meter kit and supplies KIT Dispense based on patient and insurance preference. Use up to four times daily as directed. (FOR ICD-9 250.00, 250.01). 1 each 0  ? carvedilol (COREG) 6.25 MG tablet Take 1 tablet (6.25 mg total) by mouth 2 (two) times daily with a meal. 180 tablet 3  ? dapagliflozin propanediol (FARXIGA) 10 MG TABS tablet Take 1 tablet (10 mg total) by mouth once daily. 90 tablet 3  ? famotidine (PEPCID) 20 MG tablet Take 1 tablet (20 mg total) by mouth 2 (two) times daily. 90 tablet 3  ? insulin glargine (LANTUS SOLOSTAR) 100 UNIT/ML Solostar Pen Inject 10 Units into the skin daily. 15 mL PRN  ? potassium chloride SA (KLOR-CON M) 20 MEQ tablet Take 0.5 tablets (10 mEq total) by mouth daily. 45 tablet 3  ? sacubitril-valsartan (ENTRESTO) 97-103 MG Take 1 tablet by mouth 2 (two) times daily. 180 tablet 3  ? spironolactone (ALDACTONE) 25 MG tablet Take 1 tablet (25 mg total) by mouth once daily. 90 tablet 3  ? torsemide (DEMADEX) 20 MG tablet Take 20 mg by mouth daily.    ? ?No facility-administered medications prior to visit.  ? ? ?Allergies  ?Allergen Reactions  ? No Healthtouch Food Allergies Rash and Other (See Comments)  ?  [place]  ? ? ?ROS ?Review of Systems  ?Constitutional:  Negative for chills and fever.  ?Respiratory:  Negative for cough and shortness of breath.   ?Cardiovascular:  Negative for chest pain.  ?Gastrointestinal:  Negative for diarrhea, nausea and vomiting.   ?Endocrine: Negative for polyuria.  ?Skin: Negative.   ?Neurological:  Negative for dizziness and light-headedness.  ? ?  ?Objective:  ?  ?Physical Exam ?Constitutional:   ?   Appearance: Normal appearance.  ?HENT:  ?   Head: Normocephalic and atraumatic.  ?Eyes:  ?   Conjunctiva/sclera: Conjunctivae normal.  ?Cardiovascular:  ?   Rate and Rhythm: Normal rate and regular rhythm.  ?Pulmonary:  ?   Effort: Pulmonary effort is normal.  ?   Breath sounds:  Normal breath sounds.  ?Musculoskeletal:  ?   Right lower leg: No edema.  ?   Left lower leg: No edema.  ?Skin: ?   General: Skin is warm and dry.  ?Neurological:  ?   General: No focal deficit present.  ?   Mental Status: She is alert. Mental status is at baseline.  ?Psychiatric:     ?   Mood and Affect: Mood normal.     ?   Behavior: Behavior normal.  ? ? ?BP 136/82   Pulse 89   Temp 98 ?F (36.7 ?C) (Oral)   Resp 16   Ht '5\' 7"'  (1.702 m)   Wt 196 lb 6.4 oz (89.1 kg)   SpO2 99%   BMI 30.76 kg/m?  ?Wt Readings from Last 3 Encounters:  ?07/16/21 196 lb 6.4 oz (89.1 kg)  ?07/02/21 188 lb (85.3 kg)  ?06/22/21 196 lb (88.9 kg)  ? ? ? ?Health Maintenance Due  ?Topic Date Due  ? COVID-19 Vaccine (1) Never done  ? OPHTHALMOLOGY EXAM  Never done  ? ? ?There are no preventive care reminders to display for this patient. ? ?Lab Results  ?Component Value Date  ? TSH 3.286 10/02/2019  ? ?Lab Results  ?Component Value Date  ? WBC 7.0 06/15/2021  ? HGB 10.3 (L) 06/15/2021  ? HCT 32.0 (L) 06/15/2021  ? MCV 95.0 06/15/2021  ? PLT 361 06/15/2021  ? ?Lab Results  ?Component Value Date  ? NA 135 06/15/2021  ? K 4.0 06/15/2021  ? CO2 27 06/15/2021  ? GLUCOSE 308 (H) 06/15/2021  ? BUN 20 06/15/2021  ? CREATININE 1.00 06/15/2021  ? BILITOT 0.3 06/12/2021  ? ALKPHOS 70 05/21/2020  ? AST 16 06/12/2021  ? ALT 11 06/12/2021  ? PROT 5.6 (L) 06/12/2021  ? ALBUMIN 1.2 (L) 05/21/2020  ? CALCIUM 8.3 (L) 06/15/2021  ? ANIONGAP 5 06/15/2021  ? EGFR 52 (L) 06/12/2021  ? ?Lab Results  ?Component  Value Date  ? CHOL 286 (H) 06/12/2021  ? ?Lab Results  ?Component Value Date  ? HDL 74 06/12/2021  ? ?Lab Results  ?Component Value Date  ? LDLCALC 171 (H) 06/12/2021  ? ?Lab Results  ?Component Value Date  ? TRIG 242 (H) 02/

## 2021-07-16 NOTE — Progress Notes (Signed)
Prob can wait on surgical intervention and try control of menomet first

## 2021-07-16 NOTE — Assessment & Plan Note (Signed)
Stable, continue current medications.  

## 2021-07-16 NOTE — Assessment & Plan Note (Signed)
Reviewed most recent echo, EF improving. Following with Cardiology. ?

## 2021-07-16 NOTE — Assessment & Plan Note (Signed)
Following with Gynecology.  ?

## 2021-07-16 NOTE — Assessment & Plan Note (Signed)
Change Lantus to 6 units BID for better coverage, continue [place]. Eye exam scheduled. Follow up in 1 month for recheck of sugars.  ?

## 2021-07-16 NOTE — Assessment & Plan Note (Signed)
Most likely from menorrhagia, being worked up by Gynecology.  ?

## 2021-07-16 NOTE — Assessment & Plan Note (Signed)
Stable, continue to monitor  ?

## 2021-07-16 NOTE — Assessment & Plan Note (Signed)
LDL increased on recent labs, increase Lipitor to 40 mg daily.  ?

## 2021-07-16 NOTE — Patient Instructions (Addendum)
It was great seeing you today! ? ?Plan discussed at today's visit: ?-Lantus changed to 6 units in the morning and 6 units at night ?-Lipitor dose increase to 40 mg  ? ?Follow up in: 1 month ? ?Take care and let us know if you have any questions or concerns prior to your next visit. ? ?Dr. Caralee Ates ? ?

## 2021-07-16 NOTE — Assessment & Plan Note (Signed)
Following with Cardiology

## 2021-07-20 ENCOUNTER — Other Ambulatory Visit (HOSPITAL_COMMUNITY): Payer: Self-pay

## 2021-07-20 NOTE — Progress Notes (Deleted)
? Patient ID: Molly Howard, female    DOB: [birth year], 36 y.o.   MRN: [id number] ? ?HPI ? ?Molly Howard is a 36 y/o female with a history of DM, HTN, previous drug use and chronic heart failure.  ? ?Echo report from 06/30/21 reviewed and showed af EF of Left ventricular ejection fraction, by estimation, is 30 to 35%. The left ventricle has moderately decreased function. The left ventricle demonstrates global hypokinesis. The left ventricular internal cavity size was mildly dilated. There is no left ventricular hypertrophy. Left ventricular diastolic parameters are consistent with Grade II diastolic dysfunction (pseudonormalization). CMR 05/2021 EF 42%. ? ?Echo report from 03/20/21 reviewed and showed an EF of 25-30% along with severely elevated PA pressure of 70.8 mmHg, mild LAE, mild MR and mild/moderate TR. Echo report from 10/11/20 reviewed and showed an EF of 30-35% along with moderate TR.  ? ?Admitted 11/27/20 due to acute on chronic HF with orthopnea and 40 pound weight gain. Initially given IV lasix. Given IV hydralazine for HTN. Cardiology consult obtained. IV meds transitioned to oral medications. UDS + for cocaine and opiates. Discharged after 11 days.  ? ?Molly Howard presents today for a follow-up visit with a chief complaint of moderate shortness of breath with little exertion. Molly Howard describes this as chronic in nature having been present for several years. Molly Howard has associated fatigue, palpitations, light-headedness, chronic difficulty sleeping and back pain along with this. Molly Howard denies any abdominal distention, pedal edema, chest pain, cough or weight gain.  ? ?Will be having AICD implanted January 2023-- cancelled d/t increase in EF ? ?Past Medical History:  ?Diagnosis Date  ? Acid reflux   ? Chronic HFrEF (heart failure with reduced ejection fraction) ([place])   ? a. 10/2019 Echo: EF 35-40%, GrII DD; b. 05/2020 Echo: EF 20-25%, glob HK; c. 10/2020 Echo: EF 30-35%, glob HK. GrII DD, Mildly red RV fxn. Mod TR.  ? CKD (chronic  kidney disease), stage II   ? Diabetes mellitus ([place])   ? H/O medication noncompliance   ? Hypertension   ? Microcytic anemia   ? NICM (nonischemic cardiomyopathy) ([place])   ? a. 10/2019 Echo: EF 35-40%; b. 10/2019 MV: No ischemia. Small apical defect-->breast attenuation; c. 05/2020 Echo: EF 20-25%; d. 10/2020 Echo: EF 30-35%, glob HK. GrII DD, Mildly red RV fxn. Mod TR.  ? Obesity   ? Polysubstance abuse ([place])   ? ?Past Surgical History:  ?Procedure Laterality Date  ? CHOLECYSTECTOMY    ? HERNIA REPAIR    ? ?Family History  ?Problem Relation Age of Onset  ? Heart failure Mother   ?     a. onset in her 81s  ? Diabetes Mother   ? Hypertension Father   ? Diabetes Father   ? ?Social History  ? ?Tobacco Use  ? Smoking status: Every Day  ?  Packs/day: 0.50  ?  Types: Cigarettes  ? Smokeless tobacco: Never  ?Substance Use Topics  ? Alcohol use: Yes  ?  Comment: ~ 4 shots/day on weekends only  ? ?Allergies  ?Allergen Reactions  ? No [employer] Food Allergies Rash and Other (See Comments)  ?  Lemons  ? ?Prior to Admission medications   ?Medication Sig Start Date End Date Taking? Authorizing Provider  ?atorvastatin (LIPITOR) 20 MG tablet TAKE ONE TABLET BY MOUTH EVERY DAY. 12/29/20  Yes Alisa Graff, FNP  ?blood glucose meter kit and supplies KIT Dispense based on patient and insurance preference. Use up to four times daily as  directed. (FOR ICD-9 250.00, 250.01). 12/08/20  Yes Max Sane, MD  ?carvedilol (COREG) 6.25 MG tablet Take 1 tablet (6.25 mg total) by mouth 2 (two) times daily with a meal. 12/11/20  Yes Darylene Price A, FNP  ?dapagliflozin propanediol (FARXIGA) 10 MG TABS tablet Take 1 tablet (10 mg total) by mouth once daily. 12/11/20  Yes Alisa Graff, FNP  ?insulin aspart protamine- aspart (NOVOLOG MIX 70/30) (70-30) 100 UNIT/ML injection Inject 0.1 mLs (10 Units total) into the skin 2 (two) times daily with a meal. 12/08/20  Yes Max Sane, MD  ?potassium chloride SA (KLOR-CON M) 20 MEQ tablet Take 10 mEq by mouth.    Yes [provider]  ?sacubitril-valsartan (ENTRESTO) 49-51 MG Take 1 tablet by mouth 2 (two) times daily. 12/22/20  Yes [name], MD  ?spironolactone (ALDACTONE) 25 MG tablet Take 1 tablet (25 mg total) by mouth once daily. 12/11/20  Yes Darylene Price A, FNP  ?torsemide (DEMADEX) 20 MG tablet Take 20 mg by mouth 2 (two) times daily. 03/31/21  Yes [provider]  ?Torsemide 40 MG TABS Take 40 mg by mouth daily. ?Patient not taking: Reported on 04/21/2021    [provider]  ? ? ?Review of Systems  ?Constitutional:  Positive for fatigue. Negative for appetite change.  ?HENT:  Negative for congestion, postnasal drip and sore throat.   ?Eyes: Negative.   ?Respiratory:  Positive for shortness of breath (easily). Negative for cough.   ?Cardiovascular:  Positive for palpitations. Negative for chest pain and leg swelling.  ?Gastrointestinal:  Negative for abdominal distention and abdominal pain.  ?Endocrine: Negative.   ?Genitourinary: Negative.   ?Musculoskeletal:  Positive for back pain. Negative for arthralgias.  ?Skin: Negative.   ?Allergic/Immunologic: Negative.   ?Neurological:  Positive for light-headedness. Negative for dizziness.  ?Hematological:  Negative for adenopathy. Does not bruise/bleed easily.  ?Psychiatric/Behavioral:  Positive for sleep disturbance (sleeping on 2 pillows). Negative for dysphoric mood. The patient is not nervous/anxious.   ? ?There were no vitals filed for this visit. ? ?Wt Readings from Last 3 Encounters:  ?07/16/21 196 lb 6.4 oz (89.1 kg)  ?07/02/21 188 lb (85.3 kg)  ?06/22/21 196 lb (88.9 kg)  ? ?Lab Results  ?Component Value Date  ? CREATININE 1.00 06/15/2021  ? CREATININE 1.34 (H) 06/12/2021  ? CREATININE 0.90 05/18/2021  ? ?Physical Exam ?Vitals and nursing note reviewed.  ?Constitutional:   ?   Appearance: Normal appearance.  ?HENT:  ?   Head: Normocephalic and atraumatic.  ?Cardiovascular:  ?   Rate and Rhythm: Normal rate and regular rhythm.   ?Pulmonary:  ?   Effort: Pulmonary effort is normal. No respiratory distress.  ?   Breath sounds: No wheezing or rales.  ?Abdominal:  ?   General: There is no distension.  ?   Tenderness: There is no abdominal tenderness.  ?Musculoskeletal:     ?   General: No tenderness.  ?   Cervical back: Normal range of motion and neck supple.  ?   Right lower leg: No edema.  ?   Left lower leg: No edema.  ?Skin: ?   General: Skin is warm and dry.  ?Neurological:  ?   General: No focal deficit present.  ?   Mental Status: Molly Howard is alert and oriented to person, place, and time.  ?Psychiatric:     ?   Mood and Affect: Mood normal.     ?   Behavior: Behavior normal.     ?  Thought Content: Thought content normal.  ?  ?Assessment & Plan: ? ?1: Chronic heart failure with reduced ejection fraction- ?- NYHA class III ?- weighing daily; reminded to call for an overnight weight gain of > 2 pounds or a weekly weight gain of > 5 pounds ?- weight down 2 pounds from last visit here 2 months ago ?- saw cardiology ([facility]) 07/02/21 ?- saw EP Quentin Ore) 04/08/21 and is scheduled for AICD on 05/25/20 ?- on GDMT of carvedilol, entresto, farxiga and spironolactone ?- participating in paramedicine program ?- BNP 11/27/20 was 1043.9 ? ?2: HTN- ?- BP looks good (133/77) ?- does not have PCP so appointment scheduled with [facility] on 06/12/20 ?- BMP 03/30/21 reviewed and showed sodium 137, potassium 4.1, creatinine 0.98 & GFR 77 ? ?3: DM- ?- A1c 06/25/21 was 10.5% ?- farxiga and lantus ? ?4: Substance use- ?- states last cocaine use was "many months ago" ?- continued abstinence discussed ? ? ?Medication bottles reviewed.  ? ?Return in 3 months or sooner for any questions/problems before then ? ? ? ? ? ?

## 2021-07-21 ENCOUNTER — Encounter: Payer: Self-pay | Admitting: Intensive Care

## 2021-07-21 ENCOUNTER — Emergency Department
Admission: EM | Admit: 2021-07-21 | Discharge: 2021-07-21 | Disposition: A | Discharge status: Home / Self Care | Payer: Medicaid Other | Attending: Emergency Medicine | Admitting: Emergency Medicine

## 2021-07-21 ENCOUNTER — Telehealth: Payer: Self-pay

## 2021-07-21 ENCOUNTER — Telehealth: Payer: Self-pay | Admitting: Cardiology

## 2021-07-21 ENCOUNTER — Ambulatory Visit: Payer: Medicaid Other | Admitting: Family

## 2021-07-21 ENCOUNTER — Encounter (HOSPITAL_COMMUNITY): Payer: Self-pay

## 2021-07-21 ENCOUNTER — Telehealth: Payer: Self-pay | Admitting: Family

## 2021-07-21 ENCOUNTER — Emergency Department: Payer: Medicaid Other

## 2021-07-21 ENCOUNTER — Other Ambulatory Visit: Payer: Self-pay

## 2021-07-21 DIAGNOSIS — I5042 Chronic combined systolic (congestive) and diastolic (congestive) heart failure: Secondary | ICD-10-CM | POA: Diagnosis not present

## 2021-07-21 DIAGNOSIS — R1900 Intra-abdominal and pelvic swelling, mass and lump, unspecified site: Secondary | ICD-10-CM | POA: Diagnosis not present

## 2021-07-21 DIAGNOSIS — R059 Cough, unspecified: Secondary | ICD-10-CM | POA: Insufficient documentation

## 2021-07-21 DIAGNOSIS — R0789 Other chest pain: Secondary | ICD-10-CM | POA: Insufficient documentation

## 2021-07-21 DIAGNOSIS — R0600 Dyspnea, unspecified: Secondary | ICD-10-CM | POA: Insufficient documentation

## 2021-07-21 DIAGNOSIS — I130 Hypertensive heart and chronic kidney disease with heart failure and stage 1 through stage 4 chronic kidney disease, or unspecified chronic kidney disease: Secondary | ICD-10-CM | POA: Diagnosis not present

## 2021-07-21 DIAGNOSIS — R600 Localized edema: Secondary | ICD-10-CM | POA: Diagnosis not present

## 2021-07-21 DIAGNOSIS — R7989 Other specified abnormal findings of blood chemistry: Secondary | ICD-10-CM | POA: Insufficient documentation

## 2021-07-21 DIAGNOSIS — E1122 Type 2 diabetes mellitus with diabetic chronic kidney disease: Secondary | ICD-10-CM | POA: Diagnosis not present

## 2021-07-21 DIAGNOSIS — R609 Edema, unspecified: Secondary | ICD-10-CM

## 2021-07-21 DIAGNOSIS — N182 Chronic kidney disease, stage 2 (mild): Secondary | ICD-10-CM | POA: Insufficient documentation

## 2021-07-21 DIAGNOSIS — M7989 Other specified soft tissue disorders: Secondary | ICD-10-CM | POA: Diagnosis present

## 2021-07-21 LAB — COMPREHENSIVE METABOLIC PANEL
ALT: 13 U/L (ref 0–44)
AST: 21 U/L (ref 15–41)
Albumin: 2.1 g/dL — ABNORMAL LOW (ref 3.5–5.0)
Alkaline Phosphatase: 90 U/L (ref 38–126)
Anion gap: 10 (ref 5–15)
BUN: 17 mg/dL (ref 6–20)
CO2: 27 mmol/L (ref 22–32)
Calcium: 7.7 mg/dL — ABNORMAL LOW (ref 8.9–10.3)
Chloride: 99 mmol/L (ref 98–111)
Creatinine, Ser: 1.34 mg/dL — ABNORMAL HIGH (ref 0.44–1.00)
GFR, Estimated: 52 mL/min — ABNORMAL LOW (ref >60)
Glucose, Bld: 390 mg/dL — ABNORMAL HIGH (ref 70–99)
Potassium: 3.6 mmol/L (ref 3.5–5.1)
Sodium: 136 mmol/L (ref 135–145)
Total Bilirubin: 0.6 mg/dL (ref 0.3–1.2)
Total Protein: 6.5 g/dL (ref 6.5–8.1)

## 2021-07-21 LAB — CBC WITH DIFFERENTIAL/PLATELET
Abs Immature Granulocytes: 0.01 10*3/uL (ref 0.00–0.07)
Basophils Absolute: 0.1 10*3/uL (ref 0.0–0.1)
Basophils Relative: 1 %
Eosinophils Absolute: 0.2 10*3/uL (ref 0.0–0.5)
Eosinophils Relative: 4 %
HCT: 33.1 % — ABNORMAL LOW (ref 36.0–46.0)
Hemoglobin: 10.5 g/dL — ABNORMAL LOW (ref 12.0–15.0)
Immature Granulocytes: 0 %
Lymphocytes Relative: 27 %
Lymphs Abs: 1.4 10*3/uL (ref 0.7–4.0)
MCH: 29.2 pg (ref 26.0–34.0)
MCHC: 31.7 g/dL (ref 30.0–36.0)
MCV: 92.2 fL (ref 80.0–100.0)
Monocytes Absolute: 0.4 10*3/uL (ref 0.1–1.0)
Monocytes Relative: 8 %
Neutro Abs: 3.2 10*3/uL (ref 1.7–7.7)
Neutrophils Relative %: 60 %
Platelets: 377 10*3/uL (ref 150–400)
RBC: 3.59 MIL/uL — ABNORMAL LOW (ref 3.87–5.11)
RDW: 13 % (ref 11.5–15.5)
WBC: 5.3 10*3/uL (ref 4.0–10.5)
nRBC: 0 % (ref 0.0–0.2)

## 2021-07-21 LAB — BRAIN NATRIURETIC PEPTIDE: B Natriuretic Peptide: 309.4 pg/mL — ABNORMAL HIGH (ref 0.0–100.0)

## 2021-07-21 LAB — TROPONIN I (HIGH SENSITIVITY): Troponin I (High Sensitivity): 16 ng/L (ref <18)

## 2021-07-21 MED ORDER — FUROSEMIDE 10 MG/ML IJ SOLN
80.0000 mg | Freq: Once | INTRAMUSCULAR | Status: AC
Start: 2021-07-21 — End: 2021-07-21
  Administered 2021-07-21: 80 mg via INTRAVENOUS
  Filled 2021-07-21: qty 8

## 2021-07-21 NOTE — Telephone Encounter (Signed)
Pt wanted to let you know she is having swelling and weight gain.  She has already contacted heart doctor and I told her to call them back again. ?

## 2021-07-21 NOTE — Progress Notes (Signed)
Today contacted [facility] and asked if she picked up her medications.  She advised no,  went to her pharmacy and picked them up for her.  She was not out of them yet.  She should have enough of everything for 3 months.  Keep explaining to her she will have to start picking them up.  She states she has been taking 2 of her Sherryll Burger til out of them.  She also has increase in her atorvastatin, she states been taking 2.  Advised her new dose and she will only take 1.  Advised her need to call [employer] to get new shipment of Entresto, will check to make sure they have new script.  She states she is not weighing but has some edema in hands and abdomen.  Belly a little tight, advised importance of weighing.  She denies increased of shortness of breath or chest pain.  Lungs clear. She is taking an extra torsemide today and see if helps.  Advised her if not to call me and will get in touch with cardiology for input.  She appears to understand.  She does not watch high sodium foods or fluids.  Will check on her in a couple of days if not heard from her.  Will continue to visit for heart failure, diet and medication management.  ? ?Jaleyah Longhi ?[place] EMT-Paramedic ?[phone] ?

## 2021-07-21 NOTE — Progress Notes (Signed)
Rescheduled her HF clinic appt to next week.  ? ?Adolf Ormiston ?[place] EMT-Paramedic ?[phone] ?

## 2021-07-21 NOTE — Discharge Instructions (Signed)
Please continue to take your medications as prescribed.  Please follow-up with your cardiologist in [facility] for outpatient follow-up. ?

## 2021-07-21 NOTE — Telephone Encounter (Signed)
Patient did not show for her Heart Failure Clinic appointment on 07/21/21. Will attempt to reschedule.   ?

## 2021-07-21 NOTE — Telephone Encounter (Signed)
Copied from CRM [id number]. Topic: General - Inquiry >> Jul 21, 2021  2:37 PM Daphine Deutscher D wrote: Reason for CRM: pt called asking for Margarita Mail to call her back.  Pt would not disclose anything. CB# [phone]

## 2021-07-21 NOTE — ED Triage Notes (Signed)
First Nurse Note: pt states that her "heart doctor sent her her because of fluid retention" ?Pt has a blister appearing spot on her left forearm and in her face according to her.  ?

## 2021-07-21 NOTE — Telephone Encounter (Signed)
Spoke with patient and she has been SOB since yesterday morning. She stated that she has welts on her face and arms. She stated that she is not feeling well. She also stated that she has gained at least 15 pounds in the last 3 days. I advised patient to take 1 extra dose of her Torsemide 20 mg now. I also advised patient that she should go to the hospital to be checked out as we do not have any appointments available in office until Thursday. Patient agreed with plan and stated that as soon as her sister gets home she will be going to the ED. ? ? ? ? ?

## 2021-07-21 NOTE — ED Provider Notes (Signed)
? ?[facility] ?Provider Note ? ? ? Event Date/Time  ? First MD Initiated Contact with Patient 07/21/21 1913   ?  (approximate) ? ? ?History  ? ?No chief complaint on file. ? ? ?HPI ? ?Molly Howard is a 36 y.o. female with past medical history of nonischemic cardiomyopathy, last EF 35 to 40%, diabetes, hypertension, CKD who presents concern for retaining fluid.  Patient notes that over the last several days she has felt like she has had increased swelling of her hands arms and face.  Also endorsing increased dyspnea and intermittent chest pain.  Chest pain is not clearly exertional, says it is brought on by stress is sharp located in the center of her chest.  She does endorse mild cough.  She cannot lie flat but this is her baseline.  Also endorsing some mild increase swelling in her abdomen but denies pain no fevers or chills.  She has increased her torsemide from once a day to twice a day over the last 3 days.  Think she is gained about 15 pounds.  She was supposed to have an outpatient appointment with the heart failure clinic today but she forgot about it and thus missed it.  Has otherwise been compliant with her medication.  Tries to limit her fluid intake. ?  ? ?Past Medical History:  ?Diagnosis Date  ? Acid reflux   ? CHF (congestive heart failure) ([place])   ? Chronic HFrEF (heart failure with reduced ejection fraction) ([place])   ? a. 10/2019 Echo: EF 35-40%, GrII DD; b. 05/2020 Echo: EF 20-25%, glob HK; c. 10/2020 Echo: EF 30-35%, glob HK. GrII DD, Mildly red RV fxn. Mod TR.  ? CKD (chronic kidney disease), stage II   ? Diabetes mellitus ([place])   ? H/O medication noncompliance   ? Hypertension   ? Microcytic anemia   ? NICM (nonischemic cardiomyopathy) ([place])   ? a. 10/2019 Echo: EF 35-40%; b. 10/2019 MV: No ischemia. Small apical defect-->breast attenuation; c. 05/2020 Echo: EF 20-25%; d. 10/2020 Echo: EF 30-35%, glob HK. GrII DD, Mildly red RV fxn. Mod TR.  ? Obesity   ? Polysubstance abuse ([place])    ? ? ?Patient Active Problem List  ? Diagnosis Date Noted  ? Menorrhagia with regular cycle 06/22/2021  ? HLD (hyperlipidemia) 11/27/2020  ? Type II diabetes mellitus with renal manifestations ([place]) 11/27/2020  ? Chronic kidney disease (CKD), stage II (mild) 11/27/2020  ? Iron deficiency anemia 11/27/2020  ? Hypomagnesemia 10/17/2020  ? Anasarca 10/11/2020  ? Dyspnea 05/21/2020  ? Elevated troponin 05/20/2020  ? Essential hypertension 05/20/2020  ? Diabetes mellitus, type II ([place]) 05/20/2020  ? Chronic anemia 05/20/2020  ? Reactive thrombocytosis 05/20/2020  ? Obesity (BMI 30-39.9) 05/20/2020  ? Chronic combined systolic and diastolic heart failure ([place]) 11/01/2019  ? Financial difficulties 11/01/2019  ? Medication management 11/01/2019  ? Lung nodule 11/01/2019  ? NICM (nonischemic cardiomyopathy) ([place]) 11/01/2019  ? Polysubstance abuse ([place]) 11/01/2019  ? Tobacco use 11/01/2019  ? HFrEF (heart failure with reduced ejection fraction) ([place])   ? Swelling   ? ? ? ?Physical Exam  ?Triage Vital Signs: ?ED Triage Vitals  ?Enc Vitals Group  ?   BP 07/21/21 1846 (!) 161/98  ?   Pulse Rate 07/21/21 1846 94  ?   Resp 07/21/21 1846 20  ?   Temp 07/21/21 1843 98.2 ?F (36.8 ?C)  ?   Temp Source 07/21/21 1843 Oral  ?   SpO2 07/21/21 1846  99 %  ?   Weight 07/21/21 1847 192 lb (87.1 kg)  ?   Height 07/21/21 1847 5\' 7"  (1.702 m)  ?   Head Circumference --   ?   Peak Flow --   ?   Pain Score 07/21/21 1847 0  ?   Pain Loc --   ?   Pain Edu? --   ?   Excl. in [place]? --   ? ? ?Most recent vital signs: ?Vitals:  ? 07/21/21 1843 07/21/21 1846  ?BP:  (!) 161/98  ?Pulse:  94  ?Resp:  20  ?Temp: 98.2 ?F (36.8 ?C)   ?SpO2:  99%  ? ? ? ?General: Awake, no distress.  ?CV:  Good peripheral perfusion.  ?Resp:  Normal effort.  Lungs are clear, no increased work of breathing ?Abd:  No distention.  ?Neuro:             Awake, Alert, Oriented x 3  ?Other:  No pitting edema of the bilateral lower extremities ?Do not appreciate any edema in the bilateral  hands or upper extremities ? ? ?ED Results / Procedures / Treatments  ?Labs ?(all labs ordered are listed, but only abnormal results are displayed) ?Labs Reviewed  ?CBC WITH DIFFERENTIAL/PLATELET - Abnormal; Notable for the following components:  ?    Result Value  ? RBC 3.59 (*)   ? Hemoglobin 10.5 (*)   ? HCT 33.1 (*)   ? All other components within normal limits  ?COMPREHENSIVE METABOLIC PANEL - Abnormal; Notable for the following components:  ? Glucose, Bld 390 (*)   ? Creatinine, Ser 1.34 (*)   ? Calcium 7.7 (*)   ? Albumin 2.1 (*)   ? GFR, Estimated 52 (*)   ? All other components within normal limits  ?BRAIN NATRIURETIC PEPTIDE - Abnormal; Notable for the following components:  ? B Natriuretic Peptide 309.4 (*)   ? All other components within normal limits  ?TROPONIN I (HIGH SENSITIVITY)  ? ? ? ?EKG ? ?EKG interpreted by myself, normal sinus rhythm, right axis deviation, poor R wave progression, nonspecific T wave abnormality in inferior and lateral precordial leads ? ? ?RADIOLOGY ?I reviewed the CXR which does not show any acute cardiopulmonary process; agree with radiology report  ? ? ? ?PROCEDURES: ? ?Critical Care performed: No ? ? ? ?MEDICATIONS ORDERED IN ED: ?Medications  ?furosemide (LASIX) injection 80 mg (80 mg Intravenous Given 07/21/21 2027)  ? ? ? ?IMPRESSION / MDM / ASSESSMENT AND PLAN / ED COURSE  ?I reviewed the triage vital signs and the nursing notes. ?             ?               ? ?Differential diagnosis includes, but is not limited to, CHF exacerbation, ischemia, renal failure ? ?Patient is a 36 year old female with nonischemic cardiomyopathy who presents due to concern for weight gain and fluid retention.  She primarily feels this in her upper extremities and face as well as dyspnea and some intermittent chest pain.  Chest pain seems to be something that is been longstanding.  She has increased her torsemide to twice a day so is taking 40 mg total.  Patient was supposed to have an  outpatient visit with Darylene Price today but unfortunately missed this.  Today she is a little bit hypertensive but is satting 99% on room air does not appear in any respiratory distress her lungs are clear and chest x-ray does not  show pulmonary edema.  Renal function is 1.34 which is similar to her baseline.  BNP is mildly elevated at 300.  Will check troponin and EKG given her chest pain although does not sound to be ischemic in nature.  Plan to give her dose of IV Lasix to help with diuresis.  Ultimately think she will be appropriate for outpatient management given her stable vital signs. ? ?Patient's troponin is negative EKG is nonischemic.  She was given 80 mg of IV Lasix and had good response.  Patient is appropriate for discharge at this time.   ?Clinical Course as of 07/21/21 2127  ?Tue Jul 21, 2021  ?2033 Troponin I (High Sensitivity): 16 [KM]  ?  ?Clinical Course User Index ?[KM] Rada Hay, MD  ? ? ? ?FINAL CLINICAL IMPRESSION(S) / ED DIAGNOSES  ? ?Final diagnoses:  ?Edema, unspecified type  ? ? ? ?Rx / DC Orders  ? ?ED Discharge Orders   ? ? None  ? ?  ? ? ? ?Note:  This document was prepared using Dragon voice recognition software and may include unintentional dictation errors. ?  ?Rada Hay, MD ?07/21/21 2127 ? ?

## 2021-07-21 NOTE — ED Triage Notes (Signed)
Patient reports fluid retention. Reports she has swelling all over her body and Hx CHF ?

## 2021-07-21 NOTE — ED Notes (Signed)
Presents to ED, states "My heart doctor sent me here." States "I am swelling up." S/s 3-4 days. Forgot about her appointment today with Heart Clinic. Swelling in face, some in belly and bilateral legs. Feels SOB. Chest pain intermittent since yesterday. States stress brings it on. Has had this pain before. Welt to the L forearm along her tattoo after showering today. A&O x 4. Skin p/w/d. RR even and non-labored.  ?

## 2021-07-21 NOTE — Telephone Encounter (Signed)
Pt c/o swelling: STAT is pt has developed SOB within 24 hours ? ?If swelling, where is the swelling located? In face, welts on arm ? ?How much weight have you gained and in what time span? 15 pounds in 3 days  ? ?Have you gained 3 pounds in a day or 5 pounds in a week? yes ? ?Do you have a log of your daily weights (if so, list)?  ?196 yesterday  ?192 today ? ?Are you currently taking a fluid pill? yes ? ?Are you currently SOB? Yes, Started yesterday  ? ?Have you traveled recently? no ? ? ? ?

## 2021-07-22 MED ORDER — NORETHINDRONE ACETATE 5 MG PO TABS: 5.0000 mg | ORAL_TABLET | Freq: Every day | ORAL | 0 refills | Status: DC

## 2021-07-23 ENCOUNTER — Encounter: Payer: Self-pay | Admitting: Physical Therapy

## 2021-07-23 ENCOUNTER — Ambulatory Visit: Payer: Medicaid Other | Attending: Internal Medicine | Admitting: Physical Therapy

## 2021-07-23 ENCOUNTER — Other Ambulatory Visit: Payer: Self-pay

## 2021-07-23 DIAGNOSIS — I5020 Unspecified systolic (congestive) heart failure: Secondary | ICD-10-CM | POA: Diagnosis present

## 2021-07-23 DIAGNOSIS — G8929 Other chronic pain: Secondary | ICD-10-CM | POA: Diagnosis present

## 2021-07-23 DIAGNOSIS — E1129 Type 2 diabetes mellitus with other diabetic kidney complication: Secondary | ICD-10-CM | POA: Diagnosis present

## 2021-07-23 DIAGNOSIS — M25512 Pain in left shoulder: Secondary | ICD-10-CM | POA: Diagnosis present

## 2021-07-23 NOTE — Telephone Encounter (Signed)
Pt.notified

## 2021-07-25 NOTE — Therapy (Addendum)
[place] ?[facility] ?[address] [place], [place], [address] ?Phone: [phone]   Fax:  [phone] ? ?Physical Therapy Evaluation ? ?Patient Details  ?Name: Molly Howard ?MRN: [id number] ?Date of Birth: 06/02/1985 ?Referring Provider (PT): [name], DO ? ? ?Encounter Date: 07/23/2021 ? ? PT End of Session - 07/25/21 1526   ? ? Visit Number 1   ? Number of Visits 1   ? Date for PT Re-Evaluation 07/24/21   ? PT Start Time [phone]   ? PT Stop Time 6144   ? PT Time Calculation (min) 98 min   ? Activity Tolerance Patient tolerated treatment well;Patient limited by pain   ? Behavior During Therapy [facility] for tasks assessed/performed   ? ?  ?  ? ?  ? ? ?Past Medical History:  ?Diagnosis Date  ? Acid reflux   ? CHF (congestive heart failure) ([place])   ? Chronic HFrEF (heart failure with reduced ejection fraction) ([place])   ? a. 10/2019 Echo: EF 35-40%, GrII DD; b. 05/2020 Echo: EF 20-25%, glob HK; c. 10/2020 Echo: EF 30-35%, glob HK. GrII DD, Mildly red RV fxn. Mod TR.  ? CKD (chronic kidney disease), stage II   ? Diabetes mellitus ([place])   ? H/O medication noncompliance   ? Hypertension   ? Microcytic anemia   ? NICM (nonischemic cardiomyopathy) ([place])   ? a. 10/2019 Echo: EF 35-40%; b. 10/2019 MV: No ischemia. Small apical defect-->breast attenuation; c. 05/2020 Echo: EF 20-25%; d. 10/2020 Echo: EF 30-35%, glob HK. GrII DD, Mildly red RV fxn. Mod TR.  ? Obesity   ? Polysubstance abuse ([place])   ? ? ?Past Surgical History:  ?Procedure Laterality Date  ? CHOLECYSTECTOMY    ? HERNIA REPAIR    ? ? ?There were no vitals filed for this visit. ? ? ? ? PT End of Session - 07/25/21 1526   ? ? Visit Number 1   ? Number of Visits 1   ? Date for PT Re-Evaluation 07/24/21   ? PT Start Time [phone]   ? PT Stop Time 0086   ? PT Time Calculation (min) 98 min   ? Activity Tolerance Patient tolerated treatment well;Patient limited by pain   ? Behavior During Therapy [facility] for tasks assessed/performed   ? ?  ?  ? ?   ? ? ?Past Medical History:  ?Diagnosis Date  ? Acid reflux   ? CHF (congestive heart failure) ([place])   ? Chronic HFrEF (heart failure with reduced ejection fraction) ([place])   ? a. 10/2019 Echo: EF 35-40%, GrII DD; b. 05/2020 Echo: EF 20-25%, glob HK; c. 10/2020 Echo: EF 30-35%, glob HK. GrII DD, Mildly red RV fxn. Mod TR.  ? CKD (chronic kidney disease), stage II   ? Diabetes mellitus ([place])   ? H/O medication noncompliance   ? Hypertension   ? Microcytic anemia   ? NICM (nonischemic cardiomyopathy) ([place])   ? a. 10/2019 Echo: EF 35-40%; b. 10/2019 MV: No ischemia. Small apical defect-->breast attenuation; c. 05/2020 Echo: EF 20-25%; d. 10/2020 Echo: EF 30-35%, glob HK. GrII DD, Mildly red RV fxn. Mod TR.  ? Obesity   ? Polysubstance abuse ([place])   ? ? ?Past Surgical History:  ?Procedure Laterality Date  ? CHOLECYSTECTOMY    ? HERNIA REPAIR    ? ? ?There were no vitals filed for this visit. ? ? ? ?FCE faxed to MD office and scanned into MyChart ? ? ? Plan -  07/26/21 1335   ? ? Clinical Impression Statement Minimal Overall Level of Work: Falls within the Sedentary range.  Exerting up to 10 pounds of force occasionally (Occasionally: activity or condition exist up to 1/3 or the time) and/or a negligible amount of force frequently (Frequently: activity or condition exist from 1/3 to 2/3 or the time) to lift, carry, push, pull, or otherwise move objects, including the human body.  Sedentary work involves sitting most of the time but may involve walking or standing for brief periods of time.  Jobs are sedentary if walking and standing are required only occasionally, and all other sedentary criteria are met.  Please note that the overall level of work was significantly influenced by the client's self-limiting behavior.  Therefore, the Sedentary level of work indicates a minimum ability rather than a maximum ability.  A maximum overall level of work cannot be determined at this time due to the self-limiting behavior.  Please see the  Task Performance Table for specific abilities.  Tolerance for the 8-Hour Day: Based on the individual task scores in Dynamic Strength, Position Tolerance and Mobility, the client is able to tolerate the Sedentary level of work for the 8-hour day/40-hour week.  Please note that the tolerance for the 8-hour day was significantly influenced by the client's self-limiting behavior and indicates her minimal rather than her maximal ability.   ? Stability/Clinical Decision Making Evolving/Moderate complexity   ? Clinical Decision Making Moderate   ? Rehab Potential Fair   ? PT Frequency One time visit   ? PT Treatment/Interventions ADLs/Self Care Home Management;Gait training;Stair training;Functional mobility training;Therapeutic activities;Neuromuscular re-education;Patient/family education   ? PT Next Visit Plan FCE only   ? ?  ?  ? ?  ? ? ?Patient will benefit from skilled therapeutic intervention in order to improve the following deficits and impairments:  Decreased range of motion, Difficulty walking, Decreased endurance, Impaired UE functional use, Pain, Decreased activity tolerance, Decreased balance, Postural dysfunction, Decreased strength, Decreased mobility ? ?Visit Diagnosis: ?Heart failure with reduced ejection fraction ([place]) ? 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 ?Chronic left shoulder pain ? ? ? ? ?Problem List ?Patient Active Problem List  ? Diagnosis Date Noted  ? Menorrhagia with regular cycle 06/22/2021  ? HLD (hyperlipidemia) 11/27/2020  ? Type II diabetes mellitus with renal manifestations ([place]) 11/27/2020  ? Chronic kidney disease (CKD), stage II (mild) 11/27/2020  ? Iron deficiency anemia 11/27/2020  ? Hypomagnesemia 10/17/2020  ? Anasarca 10/11/2020  ? Dyspnea 05/21/2020  ? Elevated troponin 05/20/2020  ? Essential hypertension 05/20/2020  ? Diabetes mellitus, type II ([place]) 05/20/2020  ? Chronic anemia 05/20/2020  ? Reactive  thrombocytosis 05/20/2020  ? Obesity (BMI 30-39.9) 05/20/2020  ? Chronic combined systolic and diastolic heart failure ([place]) 11/01/2019  ? Financial difficulties 11/01/2019  ? Medication management 11/01/2019  ? Lung nodule 11/01/2019  ? NICM (nonischemic cardiomyopathy) ([place]) 11/01/2019  ? Polysubstance abuse ([place]) 11/01/2019  ? Tobacco use 11/01/2019  ? HFrEF (heart failure with reduced ejection fraction) ([place])   ? Swelling   ? ?Pura Spice, PT, DPT # [phone] ?07/26/2021, 1:38 PM ? ?[place] ?[facility] ?[address] [place], [place], [address] ?Phone: [phone]   Fax:  [phone] ? ?Name: TURNER KUNZMAN ?MRN: [id number] ?Date of Birth: 01/11/1986 ? ? ?

## 2021-07-26 NOTE — Progress Notes (Deleted)
? Patient ID: Molly Howard, female    DOB: 17-Jan-1986, 36 y.o.   MRN: [id number] ? ?HPI ? ?Molly Howard is a 36 y/o female with a history of DM, HTN, previous drug use and chronic heart failure.  ? ?Echo report from 06/30/21 reviewed and showed af EF of Left ventricular ejection fraction, by estimation, is 30 to 35%. The left ventricle has moderately decreased function. The left ventricle demonstrates global hypokinesis. The left ventricular internal cavity size was mildly dilated. There is no left ventricular hypertrophy. Left ventricular diastolic parameters are consistent with Grade II diastolic dysfunction (pseudonormalization). CMR 05/2021 EF 42%. ? ?Echo report from 03/20/21 reviewed and showed an EF of 25-30% along with severely elevated PA pressure of 70.8 mmHg, mild LAE, mild MR and mild/moderate TR. Echo report from 10/11/20 reviewed and showed an EF of 30-35% along with moderate TR.  ? ?Admitted 11/27/20 due to acute on chronic HF with orthopnea and 40 pound weight gain. Initially given IV lasix. Given IV hydralazine for HTN. Cardiology consult obtained. IV meds transitioned to oral medications. UDS + for cocaine and opiates. Discharged after 11 days.  ? ?Molly Howard presents today for a follow-up visit with a chief complaint of moderate shortness of breath with little exertion. Molly Howard describes this as chronic in nature having been present for several years. Molly Howard has associated fatigue, palpitations, light-headedness, chronic difficulty sleeping and back pain along with this. Molly Howard denies any abdominal distention, pedal edema, chest pain, cough or weight gain.  ? ?Will be having AICD implanted January 2023-- cancelled d/t increase in EF ? ?Past Medical History:  ?Diagnosis Date  ? Acid reflux   ? CHF (congestive heart failure) ([place])   ? Chronic HFrEF (heart failure with reduced ejection fraction) ([place])   ? a. 10/2019 Echo: EF 35-40%, GrII DD; b. 05/2020 Echo: EF 20-25%, glob HK; c. 10/2020 Echo: EF 30-35%, glob HK. GrII DD,  Mildly red RV fxn. Mod TR.  ? CKD (chronic kidney disease), stage II   ? Diabetes mellitus ([place])   ? H/O medication noncompliance   ? Hypertension   ? Microcytic anemia   ? NICM (nonischemic cardiomyopathy) ([place])   ? a. 10/2019 Echo: EF 35-40%; b. 10/2019 MV: No ischemia. Small apical defect-->breast attenuation; c. 05/2020 Echo: EF 20-25%; d. 10/2020 Echo: EF 30-35%, glob HK. GrII DD, Mildly red RV fxn. Mod TR.  ? Obesity   ? Polysubstance abuse ([place])   ? ?Past Surgical History:  ?Procedure Laterality Date  ? CHOLECYSTECTOMY    ? HERNIA REPAIR    ? ?Family History  ?Problem Relation Age of Onset  ? Heart failure Mother   ?     a. onset in her 60s  ? Diabetes Mother   ? Hypertension Father   ? Diabetes Father   ? ?Social History  ? ?Tobacco Use  ? Smoking status: Former  ?  Packs/day: 0.50  ?  Types: Cigarettes  ?  Quit date: 2022  ?  Years since quitting: 1.2  ? Smokeless tobacco: Never  ?Substance Use Topics  ? Alcohol use: Yes  ?  Comment: ~ 4 shots/day on weekends only  ? ?Allergies  ?Allergen Reactions  ? No [employer] Food Allergies Rash and Other (See Comments)  ?  Lemons  ? ?Prior to Admission medications   ?Medication Sig Start Date End Date Taking? Authorizing Provider  ?atorvastatin (LIPITOR) 20 MG tablet TAKE ONE TABLET BY MOUTH EVERY DAY. 12/29/20  Yes Alisa Graff, FNP  ?blood  glucose meter kit and supplies KIT Dispense based on patient and insurance preference. Use up to four times daily as directed. (FOR ICD-9 250.00, 250.01). 12/08/20  Yes Max Sane, MD  ?carvedilol (COREG) 6.25 MG tablet Take 1 tablet (6.25 mg total) by mouth 2 (two) times daily with a meal. 12/11/20  Yes Darylene Price A, FNP  ?dapagliflozin propanediol (FARXIGA) 10 MG TABS tablet Take 1 tablet (10 mg total) by mouth once daily. 12/11/20  Yes Alisa Graff, FNP  ?insulin aspart protamine- aspart (NOVOLOG MIX 70/30) (70-30) 100 UNIT/ML injection Inject 0.1 mLs (10 Units total) into the skin 2 (two) times daily with a meal. 12/08/20   Yes Max Sane, MD  ?potassium chloride SA (KLOR-CON M) 20 MEQ tablet Take 10 mEq by mouth.   Yes [provider]  ?sacubitril-valsartan (ENTRESTO) 49-51 MG Take 1 tablet by mouth 2 (two) times daily. 12/22/20  Yes [name], MD  ?spironolactone (ALDACTONE) 25 MG tablet Take 1 tablet (25 mg total) by mouth once daily. 12/11/20  Yes Darylene Price A, FNP  ?torsemide (DEMADEX) 20 MG tablet Take 20 mg by mouth 2 (two) times daily. 03/31/21  Yes [provider]  ?Torsemide 40 MG TABS Take 40 mg by mouth daily. ?Patient not taking: Reported on 04/21/2021    [provider]  ? ? ?Review of Systems  ?Constitutional:  Positive for fatigue. Negative for appetite change.  ?HENT:  Negative for congestion, postnasal drip and sore throat.   ?Eyes: Negative.   ?Respiratory:  Positive for shortness of breath (easily). Negative for cough.   ?Cardiovascular:  Positive for palpitations. Negative for chest pain and leg swelling.  ?Gastrointestinal:  Negative for abdominal distention and abdominal pain.  ?Endocrine: Negative.   ?Genitourinary: Negative.   ?Musculoskeletal:  Positive for back pain. Negative for arthralgias.  ?Skin: Negative.   ?Allergic/Immunologic: Negative.   ?Neurological:  Positive for light-headedness. Negative for dizziness.  ?Hematological:  Negative for adenopathy. Does not bruise/bleed easily.  ?Psychiatric/Behavioral:  Positive for sleep disturbance (sleeping on 2 pillows). Negative for dysphoric mood. The patient is not nervous/anxious.   ? ?There were no vitals filed for this visit. ? ?Wt Readings from Last 3 Encounters:  ?07/21/21 192 lb (87.1 kg)  ?07/16/21 196 lb 6.4 oz (89.1 kg)  ?07/02/21 188 lb (85.3 kg)  ? ?Lab Results  ?Component Value Date  ? CREATININE 1.34 (H) 07/21/2021  ? CREATININE 1.00 06/15/2021  ? CREATININE 1.34 (H) 06/12/2021  ? ?Physical Exam ?Vitals and nursing note reviewed.  ?Constitutional:   ?   Appearance: Normal appearance.  ?HENT:  ?   Head:  Normocephalic and atraumatic.  ?Cardiovascular:  ?   Rate and Rhythm: Normal rate and regular rhythm.  ?Pulmonary:  ?   Effort: Pulmonary effort is normal. No respiratory distress.  ?   Breath sounds: No wheezing or rales.  ?Abdominal:  ?   General: There is no distension.  ?   Tenderness: There is no abdominal tenderness.  ?Musculoskeletal:     ?   General: No tenderness.  ?   Cervical back: Normal range of motion and neck supple.  ?   Right lower leg: No edema.  ?   Left lower leg: No edema.  ?Skin: ?   General: Skin is warm and dry.  ?Neurological:  ?   General: No focal deficit present.  ?   Mental Status: Molly Howard is alert and oriented to person, place, and time.  ?Psychiatric:     ?  Mood and Affect: Mood normal.     ?   Behavior: Behavior normal.     ?   Thought Content: Thought content normal.  ?  ?Assessment & Plan: ? ?1: Chronic heart failure with reduced ejection fraction- ?- NYHA class III ?- weighing daily; reminded to call for an overnight weight gain of > 2 pounds or a weekly weight gain of > 5 pounds ?- weight down 2 pounds from last visit here 2 months ago ?- saw cardiology ([place]) 07/02/21 ?- saw EP Quentin Ore) 04/08/21 and is scheduled for AICD on 05/25/20 ?- on GDMT of carvedilol, entresto, farxiga and spironolactone ?- participating in paramedicine program ?- BNP 11/27/20 was 1043.9 ? ?2: HTN- ?- BP looks good (133/77) ?- does not have PCP so appointment scheduled with [facility] on 06/12/20 ?- BMP 03/30/21 reviewed and showed sodium 137, potassium 4.1, creatinine 0.98 & GFR 77 ? ?3: DM- ?- A1c 06/25/21 was 10.5% ?- farxiga and lantus ? ?4: Substance use- ?- states last cocaine use was "many months ago" ?- continued abstinence discussed ? ? ?Medication bottles reviewed.  ? ?Return in 3 months or sooner for any questions/problems before then ? ? ? ? ? ?

## 2021-07-26 NOTE — Addendum Note (Signed)
Addended by: Pura Spice on: 07/26/2021 01:47 PM ? ? Modules accepted: Orders ? ?

## 2021-07-27 ENCOUNTER — Ambulatory Visit: Payer: Medicaid Other | Admitting: Family

## 2021-07-29 NOTE — Progress Notes (Signed)
? Patient ID: Molly Howard, female    DOB: [birth year], 36 y.o.   MRN: [id number] ? ?HPI ? ?Molly Howard is a 36 y/o female with a history of DM, HTN, previous drug use and chronic heart failure.  ? ?Echo report from 06/30/21 reviewed and showed an EF of 30-35% along with mild MR. Echo report from 03/20/21 reviewed and showed an EF of 25-30% along with severely elevated PA pressure of 70.8 mmHg, mild LAE, mild MR and mild/moderate TR. Echo report from 10/11/20 reviewed and showed an EF of 30-35% along with moderate TR.  ? ?Cardiac MRI 05/19/21 showed an EF of 42% ? ?Was in the ED 07/21/21 due to increased swelling of her hands arms and face.  Also endorsing increased dyspnea and intermittent chest pain and 15 pound weight gain. Was in the ED 06/15/21 due to hyperglycemia where she was treated and released.   ? ?She presents today for a follow-up visit with a chief complaint of moderate fatigue with little exertion. She describes this as chronic in nature. She has associated shortness of breath, intermittent chest pain, pedal edema, palpitations, light-headedness, chronic pain, difficulty sleeping and gradual weight gain along with this. She denies any abdominal distention or cough.  ? ?Has been out of farxiga for ~ 1 week and feels like her symptoms have been worsening since this time. Knows that she's drinking too much fluids especially ice.  ? ?Still has the entresto 49/56m dose and is now taking 2 tablets BID, per cardiology.  ? ?Past Medical History:  ?Diagnosis Date  ? Acid reflux   ? CHF (congestive heart failure) ([place]   ? Chronic HFrEF (heart failure with reduced ejection fraction) ([place]   ? a. 10/2019 Echo: EF 35-40%, GrII DD; b. 05/2020 Echo: EF 20-25%, glob HK; c. 10/2020 Echo: EF 30-35%, glob HK. GrII DD, Mildly red RV fxn. Mod TR.  ? CKD (chronic kidney disease), stage II   ? Diabetes mellitus ([place]   ? H/O medication noncompliance   ? Hypertension   ? Microcytic anemia   ? NICM (nonischemic cardiomyopathy) ([place]    ? a. 10/2019 Echo: EF 35-40%; b. 10/2019 MV: No ischemia. Small apical defect-->breast attenuation; c. 05/2020 Echo: EF 20-25%; d. 10/2020 Echo: EF 30-35%, glob HK. GrII DD, Mildly red RV fxn. Mod TR.  ? Obesity   ? Polysubstance abuse ([place]   ? ?Past Surgical History:  ?Procedure Laterality Date  ? CHOLECYSTECTOMY    ? HERNIA REPAIR    ? ?Family History  ?Problem Relation Age of Onset  ? Heart failure Mother   ?     a. onset in her 465s ? Diabetes Mother   ? Hypertension Father   ? Diabetes Father   ? ?Social History  ? ?Tobacco Use  ? Smoking status: Former  ?  Packs/day: 0.50  ?  Types: Cigarettes  ?  Quit date: 2022  ?  Years since quitting: 1.2  ? Smokeless tobacco: Never  ?Substance Use Topics  ? Alcohol use: Yes  ?  Comment: ~ 4 shots/day on weekends only  ? ?Allergies  ?Allergen Reactions  ? No [employer] Food Allergies Rash and Other (See Comments)  ?  Lemons  ? ?Prior to Admission medications   ?Medication Sig Start Date End Date Taking? Authorizing Provider  ?acetaminophen (TYLENOL) 500 MG tablet Take 1,000 mg by mouth every 6 (six) hours as needed (for pain.).   Yes [provider]  ?atorvastatin (LIPITOR) 40 MG tablet Take  1 tablet (40 mg total) by mouth daily. ?Patient taking differently: Take 20 mg by mouth daily. 07/16/21  Yes [name], DO  ?blood glucose meter kit and supplies KIT Dispense based on patient and insurance preference. Use up to four times daily as directed. (FOR ICD-9 250.00, 250.01). 12/08/20  Yes Max Sane, MD  ?carvedilol (COREG) 6.25 MG tablet Take 1 tablet (6.25 mg total) by mouth 2 (two) times daily with a meal. 12/11/20  Yes Alisa Graff, FNP  ?famotidine (PEPCID) 20 MG tablet Take 1 tablet (20 mg total) by mouth 2 (two) times daily. 06/12/21  Yes [name], DO  ?insulin glargine (LANTUS SOLOSTAR) 100 UNIT/ML Solostar Pen Inject 6 Units into the skin 2 (two) times daily. 07/16/21  Yes [name], DO  ?Insulin Pen Needle (PEN NEEDLES 3/16") 31G X  5 MM MISC 1 each by Does not apply route 2 (two) times daily. 07/16/21  Yes [name], DO  ?potassium chloride SA (KLOR-CON M) 20 MEQ tablet Take 0.5 tablets (10 mEq total) by mouth daily. 04/29/21  Yes Alisa Graff, FNP  ?sacubitril-valsartan (ENTRESTO) 49-51 MG Take 2 tablets by mouth 2 (two) times daily. 07/02/21  Yes [name], MD  ?spironolactone (ALDACTONE) 25 MG tablet Take 1 tablet (25 mg total) by mouth once daily. 12/11/20  Yes Darylene Price A, FNP  ?torsemide (DEMADEX) 20 MG tablet Take 40 mg by mouth daily. 03/31/21  Yes [provider]  ?dapagliflozin propanediol (FARXIGA) 10 MG TABS tablet Take 1 tablet (10 mg total) by mouth once daily. 07/30/21   Alisa Graff, FNP  ?norethindrone (AYGESTIN) 5 MG tablet Take 1 tablet (5 mg total) by mouth daily. ?Patient not taking: Reported on 07/30/2021 5/63/87   Copland, Deirdre Evener, PA-C  ? ?Review of Systems  ?Constitutional:  Positive for fatigue. Negative for appetite change.  ?HENT:  Negative for congestion, postnasal drip and sore throat.   ?Eyes: Negative.   ?Respiratory:  Positive for shortness of breath. Negative for cough.   ?Cardiovascular:  Positive for chest pain (last night), palpitations and leg swelling (left side).  ?Gastrointestinal:  Negative for abdominal distention and abdominal pain.  ?Endocrine: Negative.   ?Genitourinary: Negative.   ?Musculoskeletal:  Positive for back pain. Negative for arthralgias.  ?Skin: Negative.   ?Allergic/Immunologic: Negative.   ?Neurological:  Positive for light-headedness. Negative for dizziness.  ?Hematological:  Negative for adenopathy. Does not bruise/bleed easily.  ?Psychiatric/Behavioral:  Positive for sleep disturbance (sleeping on 2 pillows). Negative for dysphoric mood. The patient is not nervous/anxious.   ? ?Vitals:  ? 07/30/21 1243  ?BP: 120/79  ?Pulse: 88  ?Resp: 20  ?SpO2: 100%  ?Weight: 199 lb 4 oz (90.4 kg)  ?Height: '5\' 7"'  (1.702 m)  ? ?Wt Readings from Last 3 Encounters:   ?07/30/21 199 lb 4 oz (90.4 kg)  ?07/21/21 192 lb (87.1 kg)  ?07/16/21 196 lb 6.4 oz (89.1 kg)  ? ?Lab Results  ?Component Value Date  ? CREATININE 1.34 (H) 07/21/2021  ? CREATININE 1.00 06/15/2021  ? CREATININE 1.34 (H) 06/12/2021  ? ? ?Physical Exam ?Vitals and nursing note reviewed.  ?Constitutional:   ?   Appearance: Normal appearance.  ?HENT:  ?   Head: Normocephalic and atraumatic.  ?Cardiovascular:  ?   Rate and Rhythm: Normal rate and regular rhythm.  ?Pulmonary:  ?   Effort: Pulmonary effort is normal. No respiratory distress.  ?   Breath sounds: No wheezing or rales.  ?Abdominal:  ?   General:  There is no distension.  ?   Tenderness: There is no abdominal tenderness.  ?Musculoskeletal:     ?   General: No tenderness.  ?   Cervical back: Normal range of motion and neck supple.  ?   Right lower leg: Edema (trace pitting) present.  ?   Left lower leg: Edema (trace pitting) present.  ?Skin: ?   General: Skin is warm and dry.  ?Neurological:  ?   General: No focal deficit present.  ?   Mental Status: She is alert and oriented to person, place, and time.  ?Psychiatric:     ?   Mood and Affect: Mood normal.     ?   Behavior: Behavior normal.     ?   Thought Content: Thought content normal.  ?  ?Assessment & Plan: ? ?1: Chronic heart failure with reduced ejection fraction- ?- NYHA class III ?- weighing daily & notes a gradual weight gain at home; reminded to call for an overnight weight gain of > 2 pounds or a weekly weight gain of > 5 pounds ?- weight up 18 pounds from last visit here 3 months ago ?- saw cardiology ([place]) 07/02/21; returns 09/03/21 ?- saw EP Quentin Ore) 04/08/21  ?- on GDMT of carvedilol, entresto, farxiga and spironolactone although has been out of farxiga for ~ 1 week with worsening of symptoms ?- farxiga RX sent to pharmacy and 1 month samples provided ?- instructed her to call me back in a week if resuming farxiga doesn't help improve her symptoms ?- reviewed the importance of keeping daily  fluid intake to 60-64 ounces/ daily and how to figure out how much fluid a cup of ice is ?- participating in paramedicine program ?- BNP 07/21/21 was 309.4 ? ?2: HTN- ?- BP looks good (120/79) ?- saw PCP

## 2021-07-30 ENCOUNTER — Ambulatory Visit: Payer: Medicaid Other | Attending: Family | Admitting: Family

## 2021-07-30 ENCOUNTER — Encounter: Payer: Self-pay | Admitting: Family

## 2021-07-30 VITALS — BP 120/79 | HR 88 | Resp 20 | Ht 67.0 in | Wt 199.2 lb

## 2021-07-30 DIAGNOSIS — I130 Hypertensive heart and chronic kidney disease with heart failure and stage 1 through stage 4 chronic kidney disease, or unspecified chronic kidney disease: Secondary | ICD-10-CM | POA: Diagnosis not present

## 2021-07-30 DIAGNOSIS — E1165 Type 2 diabetes mellitus with hyperglycemia: Secondary | ICD-10-CM

## 2021-07-30 DIAGNOSIS — Z9049 Acquired absence of other specified parts of digestive tract: Secondary | ICD-10-CM | POA: Diagnosis not present

## 2021-07-30 DIAGNOSIS — E1122 Type 2 diabetes mellitus with diabetic chronic kidney disease: Secondary | ICD-10-CM | POA: Diagnosis not present

## 2021-07-30 DIAGNOSIS — I10 Essential (primary) hypertension: Secondary | ICD-10-CM

## 2021-07-30 DIAGNOSIS — I5022 Chronic systolic (congestive) heart failure: Secondary | ICD-10-CM | POA: Diagnosis present

## 2021-07-30 DIAGNOSIS — Z794 Long term (current) use of insulin: Secondary | ICD-10-CM | POA: Diagnosis not present

## 2021-07-30 MED ORDER — DAPAGLIFLOZIN PROPANEDIOL 10 MG PO TABS: 10.0000 mg | ORAL_TABLET | Freq: Every day | ORAL | 3 refills | Status: DC

## 2021-07-30 NOTE — Patient Instructions (Addendum)
Continue weighing daily and call for an overnight weight gain of 3 pounds or more or a weekly weight gain of more than 5 pounds. ? ? ?If you have voicemail, please make sure your mailbox is cleaned out so that we may leave a message and please make sure to listen to any voicemails.  ? ? ?Keep daily fluid intake to 60-64 ounces a day.  ? ?Resume your farxiga as 1 tablet every morning. I've given you 1 month of samples and sent a new prescription in to [facility].  ?

## 2021-08-08 ENCOUNTER — Emergency Department
Admission: EM | Admit: 2021-08-08 | Discharge: 2021-08-08 | Disposition: A | Discharge status: Home / Self Care | Payer: Medicaid Other | Attending: Emergency Medicine | Admitting: Emergency Medicine

## 2021-08-08 ENCOUNTER — Other Ambulatory Visit: Payer: Self-pay

## 2021-08-08 ENCOUNTER — Emergency Department: Payer: Medicaid Other

## 2021-08-08 ENCOUNTER — Encounter: Payer: Self-pay | Admitting: Emergency Medicine

## 2021-08-08 DIAGNOSIS — M25812 Other specified joint disorders, left shoulder: Secondary | ICD-10-CM | POA: Diagnosis not present

## 2021-08-08 DIAGNOSIS — M25512 Pain in left shoulder: Secondary | ICD-10-CM | POA: Diagnosis present

## 2021-08-08 MED ORDER — MELOXICAM 15 MG PO TABS: 15.0000 mg | ORAL_TABLET | Freq: Every day | ORAL | 0 refills | Status: DC

## 2021-08-08 MED ORDER — KETOROLAC TROMETHAMINE 30 MG/ML IJ SOLN
30.0000 mg | Freq: Once | INTRAMUSCULAR | Status: AC
  Administered 2021-08-08: 30 mg via INTRAMUSCULAR
  Filled 2021-08-08: qty 1

## 2021-08-08 NOTE — ED Provider Notes (Signed)
? ?[facility] ?Provider Note ? ?Patient Contact: 3:06 PM (approximate) ? ? ?History  ? ?Shoulder Injury ? ? ?HPI ? ?Molly Howard is a 36 y.o. female who presents the emergency department complaining of left shoulder pain.  Patient states that she fell on her shoulder plan for polyuria ago.  Over the last week she has had some increased pain to her left shoulder.  No recent trauma.  Patient states that she is having pain mostly along the anterior shoulder that sometimes radiates into the proximal humerus and sometimes up into the neck.  No loss of sensation in the hand.  Decreased range of motion secondary to pain but not true loss of range of motion.  No other injury or complaint. ?  ? ? ?Physical Exam  ? ?Triage Vital Signs: ?ED Triage Vitals  ?Enc Vitals Group  ?   BP --   ?   Pulse --   ?   Resp --   ?   Temp --   ?   Temp src --   ?   SpO2 --   ?   Weight 08/08/21 1416 186 lb (84.4 kg)  ?   Height 08/08/21 1416 5\' 7"  (1.702 m)  ?   Head Circumference --   ?   Peak Flow --   ?   Pain Score 08/08/21 1415 9  ?   Pain Loc --   ?   Pain Edu? --   ?   Excl. in GC? --   ? ? ?Most recent vital signs: ?There were no vitals filed for this visit. ? ? ?General: Alert and in no acute distress.  ?Neck: No stridor. No cervical spine tenderness to palpation.  ?Cardiovascular:  Good peripheral perfusion ?Respiratory: Normal respiratory effort without tachypnea or retractions. Lungs CTAB. ?Musculoskeletal: Full range of motion to all extremities.  No visible traumatic findings to the left shoulder.  Patient has tenderness to palpation of the [facility] joint.  No palpable abnormality in this region.  No other significant tenderness to palpation.  Full passive range of motion.  Active range of motion is somewhat limited due to pain.  Patient limitation is globally and does not appear to be more limited with extension, flexion, abduction or abduction.  Examination of the elbow and wrist is unremarkable.  Radial  pulse intact distally.  Sensation intact distally. ?Neurologic:  No gross focal neurologic deficits are appreciated.  ?Skin:   No rash noted ?Other: ? ? ?ED Results / Procedures / Treatments  ? ?Labs ?(all labs ordered are listed, but only abnormal results are displayed) ?Labs Reviewed - No data to display ? ? ?EKG ? ? ? ? ?RADIOLOGY ? ?I personally viewed and evaluated these images as part of my medical decision making, as well as reviewing the written report by the radiologist. ? ?ED Provider Interpretation: No acute findings about the L shoulder ? ?DG Shoulder Left ? ?Result Date: 08/08/2021 ?CLINICAL DATA:  Left shoulder injury a year ago playing football. Worsening pain over the last week. EXAM: LEFT SHOULDER - 2+ VIEW COMPARISON:  None. FINDINGS: No evidence for an acute fracture. No subluxation or dislocation. No shoulder separation. Mild degenerative spurring noted on the humeral head. IMPRESSION: No acute bony abnormality. Electronically Signed   By: 10/08/2021 M.D.   On: 08/08/2021 15:28   ? ?PROCEDURES: ? ?Critical Care performed: No ? ?Procedures ? ? ?MEDICATIONS ORDERED IN ED: ?Medications  ?ketorolac (TORADOL) 30 MG/ML injection 30 mg (  has no administration in time range)  ? ? ? ?IMPRESSION / MDM / ASSESSMENT AND PLAN / ED COURSE  ?I reviewed the triage vital signs and the nursing notes. ?             ?               ? ?Differential diagnosis includes, but is not limited to, arthritis of the left shoulder, impingement syndrome, biceps tear, rotator cuff injury ? ? ? ? ?Patient's diagnosis is consistent with impingement syndrome of the left shoulder.  Patient presents emergency department nontraumatic left shoulder pain times a week.  Patient had an injury a year ago.  X-ray reveals no acute osseous abnormality.  Patient is point tender over the [facility] joint concerning for impingement syndrome.  Patient will be placed on an anti-inflammatory, given sling for symptomatic relief.  Patient is encouraged to  move her shoulder despite sling use to ensure no complications such as frozen shoulder syndrome.  Patient will follow-up with orthopedics if oral medications do not improve her symptoms.. . Patient is given ED precautions to return to the ED for any worsening or new symptoms. ? ? ? ?  ? ? ?FINAL CLINICAL IMPRESSION(S) / ED DIAGNOSES  ? ?Final diagnoses:  ?Impingement of left shoulder  ? ? ? ?Rx / DC Orders  ? ?ED Discharge Orders   ? ?      Ordered  ?  meloxicam (MOBIC) 15 MG tablet  Daily       ? 08/08/21 1558  ? ?  ?  ? ?  ? ? ? ?Note:  This document was prepared using Dragon voice recognition software and may include unintentional dictation errors. ?  ?Racheal Patches, PA-C ?08/08/21 1601 ? ?  ?Gilles Chiquito, MD ?08/08/21 1836 ? ?

## 2021-08-08 NOTE — ED Triage Notes (Signed)
Pt via POV from home. Pt c/o L shoulder injury a year ago playing football, states that this past week it has gotten worse. Pt is A&Ox4 and NAD.  ?

## 2021-08-13 ENCOUNTER — Ambulatory Visit: Payer: Medicaid Other | Admitting: Internal Medicine

## 2021-08-13 ENCOUNTER — Telehealth (HOSPITAL_COMMUNITY): Payer: Self-pay

## 2021-08-13 NOTE — Progress Notes (Deleted)
? ?Established Patient Office Visit ? ?Subjective:  ?Patient ID: Molly Howard, female    DOB: 1986-02-13  Age: 36 y.o. MRN: [id number] ? ?CC:  ?No chief complaint on file. ? ? ?HPI ?Molly Howard presents for follow up. ? ?Diabetes, Type uncertain ?-First diagnosed when she was a kid and told she was a type 1, but then told she was a type 2 later in life so uncertain  ?-Last I2M 4/15 10.5 ?-Medications: Fargixa 10, Lantus 6 units BID changed at LOV (had been 10 units once daily) ?-Patient is compliant with the above medications and reports no side effects.  ?-Checking BG at home: fasting 250-300, evening 100, had one low sugar in the 80's in the afternoon  ?-Diet: recently cut out fried foods, tries to eat fruit for snacks   ?-Exercise: tries to walk but gets short of breath with the heart failure  ?-Eye exam: April 22 scheduled  ?-Foot exam: UTD, saw Podiatrist and had nails cut  ?-Microalbumin: elevated, on Entresto and Farxiga  ?-Statin: yes ?-PNA vaccine: Prevnar 23 in 7/22 ?-Denies symptoms of hypoglycemia, polyuria, polydipsia, numbness extremities, foot ulcers/trauma.  ? ?Hypertension/HFrEF/NICM: ?-Medications: Torsemide 30 once daily, Carvedilol 6.25, Spirnolactone 25, Entresto and KCl 10  ?-Patient is compliant with above medications and reports no side effects. ?-Denies any SOB, CP, vision changes, LE edema or symptoms of hypotension ?-Follows with Cardiology, saw them last month, Entresto dose increased ?-Last echo 2/23  EF 30-35% which is improved  ? ?HLD: ?-Medications: Lipitor 20 for one year ?-Patient is compliant with above medications and reports no side effects.  ?-Last lipid panel: Lipid Panel  ?   ?Component Value Date/Time  ? CHOL 286 (H) 06/12/2021 1354  ? TRIG 242 (H) 06/12/2021 1354  ? HDL 74 06/12/2021 1354  ? CHOLHDL 3.9 06/12/2021 1354  ? VLDL 24 05/21/2020 0643  ? LDLCALC 171 (H) 06/12/2021 1354  ? ?GERD: ?-Not currently on daily medications, taking Pepcid as needed ?  ?CKD: ?-Last  creatinine 2/23 1.00, GFR >60 ?  ?Anemia: ?-Last hgb 10.3 in 2/23, has menorrhagia, referred to Gyn who did Pap and vaginal [place], awaiting follow up appointment to discuss results  ? ?Past Medical History:  ?Diagnosis Date  ? Acid reflux   ? CHF (congestive heart failure) ([place])   ? Chronic HFrEF (heart failure with reduced ejection fraction) ([place])   ? a. 10/2019 Echo: EF 35-40%, GrII DD; b. 05/2020 Echo: EF 20-25%, glob HK; c. 10/2020 Echo: EF 30-35%, glob HK. GrII DD, Mildly red RV fxn. Mod TR.  ? CKD (chronic kidney disease), stage II   ? Diabetes mellitus ([place])   ? H/O medication noncompliance   ? Hypertension   ? Microcytic anemia   ? NICM (nonischemic cardiomyopathy) ([place])   ? a. 10/2019 Echo: EF 35-40%; b. 10/2019 MV: No ischemia. Small apical defect-->breast attenuation; c. 05/2020 Echo: EF 20-25%; d. 10/2020 Echo: EF 30-35%, glob HK. GrII DD, Mildly red RV fxn. Mod TR.  ? Obesity   ? Polysubstance abuse ([place])   ? ? ?Past Surgical History:  ?Procedure Laterality Date  ? CHOLECYSTECTOMY    ? HERNIA REPAIR    ? ? ?Family History  ?Problem Relation Age of Onset  ? Heart failure Mother   ?     a. onset in her 35s  ? Diabetes Mother   ? Hypertension Father   ? Diabetes Father   ? ? ?Social History  ? ?Socioeconomic History  ? Marital status: Single  ?  Spouse name: Not on file  ? Number of children: Not on file  ? Years of education: Not on file  ? Highest education level: Not on file  ?Occupational History  ? Not on file  ?Tobacco Use  ? Smoking status: Former  ?  Packs/day: 0.50  ?  Types: Cigarettes  ?  Quit date: 2022  ?  Years since quitting: 1.2  ? Smokeless tobacco: Never  ?Vaping Use  ? Vaping Use: Never used  ?Substance and Sexual Activity  ? Alcohol use: Yes  ?  Comment: ~ 4 shots/day on weekends only  ? Drug use: Not Currently  ?  Types: Cocaine  ?  Comment: Last day of use was prior to June 2021 hosp.visit.   ? Sexual activity: Not Currently  ?  Birth control/protection: None  ?Other Topics Concern  ? Not on  file  ?Social History Narrative  ? Not on file  ? ?Social Determinants of Health  ? ?Financial Resource Strain: Not on file  ?Food Insecurity: Not on file  ?Transportation Needs: Not on file  ?Physical Activity: Not on file  ?Stress: Not on file  ?Social Connections: Not on file  ?Intimate Partner Violence: Not on file  ? ? ?Outpatient Medications Prior to Visit  ?Medication Sig Dispense Refill  ? acetaminophen (TYLENOL) 500 MG tablet Take 1,000 mg by mouth every 6 (six) hours as needed (for pain.).    ? atorvastatin (LIPITOR) 40 MG tablet Take 1 tablet (40 mg total) by mouth daily. (Patient taking differently: Take 20 mg by mouth daily.) 90 tablet 3  ? blood glucose meter kit and supplies KIT Dispense based on patient and insurance preference. Use up to four times daily as directed. (FOR ICD-9 250.00, 250.01). 1 each 0  ? carvedilol (COREG) 6.25 MG tablet Take 1 tablet (6.25 mg total) by mouth 2 (two) times daily with a meal. 180 tablet 3  ? dapagliflozin propanediol (FARXIGA) 10 MG TABS tablet Take 1 tablet (10 mg total) by mouth once daily. 90 tablet 3  ? famotidine (PEPCID) 20 MG tablet Take 1 tablet (20 mg total) by mouth 2 (two) times daily. 90 tablet 3  ? insulin glargine (LANTUS SOLOSTAR) 100 UNIT/ML Solostar Pen Inject 6 Units into the skin 2 (two) times daily. 15 mL PRN  ? Insulin Pen Needle (PEN NEEDLES 3/16") 31G X 5 MM MISC 1 each by Does not apply route 2 (two) times daily. 100 each 3  ? meloxicam (MOBIC) 15 MG tablet Take 1 tablet (15 mg total) by mouth daily. 30 tablet 0  ? norethindrone (AYGESTIN) 5 MG tablet Take 1 tablet (5 mg total) by mouth daily. (Patient not taking: Reported on 07/30/2021) 90 tablet 0  ? potassium chloride SA (KLOR-CON M) 20 MEQ tablet Take 0.5 tablets (10 mEq total) by mouth daily. 45 tablet 3  ? sacubitril-valsartan (ENTRESTO) 97-103 MG Take 1 tablet by mouth 2 (two) times daily. 180 tablet 3  ? spironolactone (ALDACTONE) 25 MG tablet Take 1 tablet (25 mg total) by mouth once  daily. 90 tablet 3  ? torsemide (DEMADEX) 20 MG tablet Take 40 mg by mouth daily.    ? ?No facility-administered medications prior to visit.  ? ? ?Allergies  ?Allergen Reactions  ? No [employer] Food Allergies Rash and Other (See Comments)  ?  Lemons  ? ? ?ROS ?Review of Systems  ?Constitutional:  Negative for chills and fever.  ?Respiratory:  Negative for cough and shortness of breath.   ?Cardiovascular:  Negative for chest pain.  ?Gastrointestinal:  Negative for diarrhea, nausea and vomiting.  ?Endocrine: Negative for polyuria.  ?Skin: Negative.   ?Neurological:  Negative for dizziness and light-headedness.  ? ?  ?Objective:  ?  ?Physical Exam ?Constitutional:   ?   Appearance: Normal appearance.  ?HENT:  ?   Head: Normocephalic and atraumatic.  ?Eyes:  ?   Conjunctiva/sclera: Conjunctivae normal.  ?Cardiovascular:  ?   Rate and Rhythm: Normal rate and regular rhythm.  ?Pulmonary:  ?   Effort: Pulmonary effort is normal.  ?   Breath sounds: Normal breath sounds.  ?Musculoskeletal:  ?   Right lower leg: No edema.  ?   Left lower leg: No edema.  ?Skin: ?   General: Skin is warm and dry.  ?Neurological:  ?   General: No focal deficit present.  ?   Mental Status: She is alert. Mental status is at baseline.  ?Psychiatric:     ?   Mood and Affect: Mood normal.     ?   Behavior: Behavior normal.  ? ? ?There were no vitals taken for this visit. ?Wt Readings from Last 3 Encounters:  ?08/08/21 186 lb (84.4 kg)  ?07/30/21 199 lb 4 oz (90.4 kg)  ?07/21/21 192 lb (87.1 kg)  ? ? ? ?Health Maintenance Due  ?Topic Date Due  ? COVID-19 Vaccine (1) Never done  ? OPHTHALMOLOGY EXAM  Never done  ? ? ?There are no preventive care reminders to display for this patient. ? ?Lab Results  ?Component Value Date  ? TSH 3.286 10/02/2019  ? ?Lab Results  ?Component Value Date  ? WBC 5.3 07/21/2021  ? HGB 10.5 (L) 07/21/2021  ? HCT 33.1 (L) 07/21/2021  ? MCV 92.2 07/21/2021  ? PLT 377 07/21/2021  ? ?Lab Results  ?Component Value Date  ? NA 136  07/21/2021  ? K 3.6 07/21/2021  ? CO2 27 07/21/2021  ? GLUCOSE 390 (H) 07/21/2021  ? BUN 17 07/21/2021  ? CREATININE 1.34 (H) 07/21/2021  ? BILITOT 0.6 07/21/2021  ? ALKPHOS 90 07/21/2021  ? AST 21 07/22/18

## 2021-08-13 NOTE — Telephone Encounter (Signed)
Attempted to call, no answer.  Will try again.  Left message.  ? ?Jaylene Arrowood ?[place] EMT-Paramedic ?[phone] ?

## 2021-08-20 ENCOUNTER — Other Ambulatory Visit (HOSPITAL_COMMUNITY): Payer: Self-pay

## 2021-08-20 NOTE — Progress Notes (Signed)
Today took Narely her medication Marcelline Deist, she has been out of this week and has not picked it up.  She states has gained about 10 lbs in past week being without it.  Reminded her she could take an extra fluid pill and then she remembered she could.  She states will today and watch her fluids today.  She states has been drinking too much fluids lately.  She denies any problems other than weight gain.  She denies any chest pain, headaches, dizziness or increased shortness of breath.  She was outside chasing dog with her neighbors.  She states she received the call from [facility] program, they are texting her the information.  Advised her any problems setting it up to let me know, I will help her.  She states transportation could not get her on Friday for her eye appt, she rescheduled it and she scheduled a ride for her cardiology appt coming up in May.  She has been doing well with scheduling and showing up for her appts. Will continue to visit for heart failure, diet and medication management.  ? ?Lamiya Naas ?[place] EMT-Paramedic ?[phone] ?

## 2021-08-24 ENCOUNTER — Encounter: Payer: Self-pay | Admitting: Family

## 2021-08-24 ENCOUNTER — Other Ambulatory Visit: Payer: Self-pay | Admitting: Family

## 2021-08-24 ENCOUNTER — Ambulatory Visit
Admission: RE | Admit: 2021-08-24 | Discharge: 2021-08-24 | Disposition: A | Discharge status: Home / Self Care | Payer: Medicaid Other | Source: Ambulatory Visit | Attending: Family | Admitting: Family

## 2021-08-24 ENCOUNTER — Other Ambulatory Visit (HOSPITAL_COMMUNITY): Payer: Self-pay

## 2021-08-24 ENCOUNTER — Ambulatory Visit (HOSPITAL_BASED_OUTPATIENT_CLINIC_OR_DEPARTMENT_OTHER): Payer: Medicaid Other | Admitting: Family

## 2021-08-24 VITALS — BP 149/96 | HR 88 | Resp 20 | Ht 67.0 in | Wt 203.0 lb

## 2021-08-24 DIAGNOSIS — I130 Hypertensive heart and chronic kidney disease with heart failure and stage 1 through stage 4 chronic kidney disease, or unspecified chronic kidney disease: Secondary | ICD-10-CM | POA: Insufficient documentation

## 2021-08-24 DIAGNOSIS — N182 Chronic kidney disease, stage 2 (mild): Secondary | ICD-10-CM | POA: Diagnosis not present

## 2021-08-24 DIAGNOSIS — I5023 Acute on chronic systolic (congestive) heart failure: Secondary | ICD-10-CM

## 2021-08-24 DIAGNOSIS — Z01812 Encounter for preprocedural laboratory examination: Secondary | ICD-10-CM | POA: Diagnosis present

## 2021-08-24 DIAGNOSIS — E1122 Type 2 diabetes mellitus with diabetic chronic kidney disease: Secondary | ICD-10-CM | POA: Insufficient documentation

## 2021-08-24 DIAGNOSIS — Z7984 Long term (current) use of oral hypoglycemic drugs: Secondary | ICD-10-CM | POA: Diagnosis not present

## 2021-08-24 DIAGNOSIS — I10 Essential (primary) hypertension: Secondary | ICD-10-CM

## 2021-08-24 DIAGNOSIS — Z794 Long term (current) use of insulin: Secondary | ICD-10-CM | POA: Diagnosis not present

## 2021-08-24 DIAGNOSIS — Z79899 Other long term (current) drug therapy: Secondary | ICD-10-CM | POA: Insufficient documentation

## 2021-08-24 DIAGNOSIS — E1165 Type 2 diabetes mellitus with hyperglycemia: Secondary | ICD-10-CM | POA: Diagnosis not present

## 2021-08-24 LAB — BASIC METABOLIC PANEL
Anion gap: 8 (ref 5–15)
BUN: 13 mg/dL (ref 6–20)
CO2: 28 mmol/L (ref 22–32)
Calcium: 7.5 mg/dL — ABNORMAL LOW (ref 8.9–10.3)
Chloride: 102 mmol/L (ref 98–111)
Creatinine, Ser: 0.95 mg/dL (ref 0.44–1.00)
GFR, Estimated: >60 mL/min (ref >60)
Glucose, Bld: 229 mg/dL — ABNORMAL HIGH (ref 70–99)
Potassium: 3.4 mmol/L — ABNORMAL LOW (ref 3.5–5.1)
Sodium: 138 mmol/L (ref 135–145)

## 2021-08-24 LAB — BRAIN NATRIURETIC PEPTIDE: B Natriuretic Peptide: 781.6 pg/mL — ABNORMAL HIGH (ref 0.0–100.0)

## 2021-08-24 MED ORDER — POTASSIUM CHLORIDE CRYS ER 20 MEQ PO TBCR
40.0000 meq | EXTENDED_RELEASE_TABLET | Freq: Once | ORAL | Status: AC
  Administered 2021-08-24: 40 meq via ORAL

## 2021-08-24 MED ORDER — FUROSEMIDE 10 MG/ML IJ SOLN
INTRAMUSCULAR | Status: AC
  Filled 2021-08-24: qty 8

## 2021-08-24 MED ORDER — FUROSEMIDE 10 MG/ML IJ SOLN
80.0000 mg | Freq: Once | INTRAMUSCULAR | Status: AC
  Administered 2021-08-24: 80 mg via INTRAVENOUS

## 2021-08-24 MED ORDER — POTASSIUM CHLORIDE CRYS ER 20 MEQ PO TBCR
EXTENDED_RELEASE_TABLET | ORAL | Status: AC
  Filled 2021-08-24: qty 2

## 2021-08-24 NOTE — Progress Notes (Signed)
? Patient ID: Molly Howard, female    DOB: 1985/09/15, 36 y.o.   MRN: [id number] ? ?HPI ? ?Molly Howard is a 36 y/o female with a history of DM, HTN, previous drug use and chronic heart failure.  ? ?Echo report from 06/30/21 reviewed and showed an EF of 30-35% along with mild MR. Echo report from 03/20/21 reviewed and showed an EF of 25-30% along with severely elevated PA pressure of 70.8 mmHg, mild LAE, mild MR and mild/moderate TR. Echo report from 10/11/20 reviewed and showed an EF of 30-35% along with moderate TR.  ? ?Cardiac MRI 05/19/21 showed an EF of 42% ? ?Was in the ED 08/08/21 due to left shoulder impingement where she was evaluated and released. Was in the ED 07/21/21 due to increased swelling of her hands arms and face.  Also endorsing increased dyspnea and intermittent chest pain and 15 pound weight gain. Was in the ED 06/15/21 due to hyperglycemia where she was treated and released.   ? ?She presents today for an acute visit with a chief complaint of moderate shortness of breath with little exertion. She describes this as worsening over the last 5-6 days or so since she ran out of her farxiga.(This was resumed 4 days ago). She has associated fatigue, pedal edema, palpitations, light-headedness, cough and weight gain (although she hasn't been weighing daily). She denies any abdominal distention or chest pain. ? ?Has working scales but hasn't been weighing. Last time she weighed was ~ 1 week ago. Will be starting with [facility] program soon.  ? ?Has been using seasoning salt for her foods. Sister that does the cooking is a Biomedical scientist. Patient admits that her food tastes "bland" to her if she doesn't add the seasoning salt to it.  ? ?Says that she's drinking "too much fluids" and is aware of it. Loves to eat ice and understands how to measure ice into fluids.  ? ?Past Medical History:  ?Diagnosis Date  ? Acid reflux   ? CHF (congestive heart failure) ([place])   ? Chronic HFrEF (heart failure with reduced ejection  fraction) ([place])   ? a. 10/2019 Echo: EF 35-40%, GrII DD; b. 05/2020 Echo: EF 20-25%, glob HK; c. 10/2020 Echo: EF 30-35%, glob HK. GrII DD, Mildly red RV fxn. Mod TR.  ? CKD (chronic kidney disease), stage II   ? Diabetes mellitus ([place])   ? H/O medication noncompliance   ? Hypertension   ? Microcytic anemia   ? NICM (nonischemic cardiomyopathy) ([place])   ? a. 10/2019 Echo: EF 35-40%; b. 10/2019 MV: No ischemia. Small apical defect-->breast attenuation; c. 05/2020 Echo: EF 20-25%; d. 10/2020 Echo: EF 30-35%, glob HK. GrII DD, Mildly red RV fxn. Mod TR.  ? Obesity   ? Polysubstance abuse ([place])   ? ?Past Surgical History:  ?Procedure Laterality Date  ? CHOLECYSTECTOMY    ? HERNIA REPAIR    ? ?Family History  ?Problem Relation Age of Onset  ? Heart failure Mother   ?     a. onset in her 60s  ? Diabetes Mother   ? Hypertension Father   ? Diabetes Father   ? ?Social History  ? ?Tobacco Use  ? Smoking status: Former  ?  Packs/day: 0.50  ?  Types: Cigarettes  ?  Quit date: 2022  ?  Years since quitting: 1.3  ? Smokeless tobacco: Never  ?Substance Use Topics  ? Alcohol use: Yes  ?  Comment: ~ 4 shots/day on weekends only  ? ?  Allergies  ?Allergen Reactions  ? No [facility] Food Allergies Rash and Other (See Comments)  ?  Lemons  ? ?Prior to Admission medications   ?Medication Sig Start Date End Date Taking? Authorizing Provider  ?acetaminophen (TYLENOL) 500 MG tablet Take 1,000 mg by mouth every 6 (six) hours as needed (for pain.).   Yes [provider]  ?atorvastatin (LIPITOR) 40 MG tablet Take 1 tablet (40 mg total) by mouth daily. ?Patient taking differently: Take 20 mg by mouth daily. 07/16/21  Yes [name], DO  ?blood glucose meter kit and supplies KIT Dispense based on patient and insurance preference. Use up to four times daily as directed. (FOR ICD-9 250.00, 250.01). 12/08/20  Yes Max Sane, MD  ?carvedilol (COREG) 6.25 MG tablet Take 1 tablet (6.25 mg total) by mouth 2 (two) times daily with a meal. 12/11/20   Yes Darylene Price A, FNP  ?dapagliflozin propanediol (FARXIGA) 10 MG TABS tablet Take 1 tablet (10 mg total) by mouth once daily. 07/30/21  Yes Alisa Graff, FNP  ?famotidine (PEPCID) 20 MG tablet Take 1 tablet (20 mg total) by mouth 2 (two) times daily. 06/12/21  Yes [name], DO  ?insulin glargine (LANTUS SOLOSTAR) 100 UNIT/ML Solostar Pen Inject 6 Units into the skin 2 (two) times daily. 07/16/21  Yes [name], DO  ?Insulin Pen Needle (PEN NEEDLES 3/16") 31G X 5 MM MISC 1 each by Does not apply route 2 (two) times daily. 07/16/21  Yes [name], DO  ?meloxicam (MOBIC) 15 MG tablet Take 1 tablet (15 mg total) by mouth daily. 08/08/21 08/08/22 Yes Cuthriell, Charline Bills, PA-C  ?potassium chloride SA (KLOR-CON M) 20 MEQ tablet Take 0.5 tablets (10 mEq total) by mouth daily. 04/29/21  Yes Alisa Graff, FNP  ?sacubitril-valsartan (ENTRESTO) 97-103 MG Take 1 tablet by mouth 2 (two) times daily. 07/02/21  Yes [name], MD  ?spironolactone (ALDACTONE) 25 MG tablet Take 1 tablet (25 mg total) by mouth once daily. 12/11/20  Yes Darylene Price A, FNP  ?torsemide (DEMADEX) 20 MG tablet Take 40 mg by mouth daily. 03/31/21  Yes [provider]  ?norethindrone (AYGESTIN) 5 MG tablet Take 1 tablet (5 mg total) by mouth daily. ?Patient not taking: Reported on 07/30/2021 1/61/09   Copland, Deirdre Evener, PA-C  ? ? ?Review of Systems  ?Constitutional:  Positive for fatigue. Negative for appetite change.  ?HENT:  Negative for congestion, postnasal drip and sore throat.   ?Eyes: Negative.   ?Respiratory:  Positive for cough and shortness of breath.   ?Cardiovascular:  Positive for palpitations and leg swelling (left side). Negative for chest pain.  ?Gastrointestinal:  Negative for abdominal distention and abdominal pain.  ?Endocrine: Negative.   ?Genitourinary: Negative.   ?Musculoskeletal:  Positive for back pain. Negative for arthralgias.  ?Skin: Negative.   ?Allergic/Immunologic: Negative.    ?Neurological:  Positive for light-headedness. Negative for dizziness.  ?Hematological:  Negative for adenopathy. Does not bruise/bleed easily.  ?Psychiatric/Behavioral:  Positive for sleep disturbance (sleeping on 2 pillows). Negative for dysphoric mood. The patient is not nervous/anxious.   ? ?Vitals:  ? 08/24/21 1503  ?BP: (!) 149/96  ?Pulse: 88  ?Resp: 20  ?SpO2: 100%  ?Weight: 203 lb (92.1 kg)  ?Height: '5\' 7"'  (1.702 m)  ? ?Wt Readings from Last 3 Encounters:  ?08/24/21 203 lb (92.1 kg)  ?08/08/21 186 lb (84.4 kg)  ?07/30/21 199 lb 4 oz (90.4 kg)  ? ?Lab Results  ?Component Value Date  ? CREATININE 1.34 (H)  07/21/2021  ? CREATININE 1.00 06/15/2021  ? CREATININE 1.34 (H) 06/12/2021  ? ? ?Physical Exam ?Vitals and nursing note reviewed.  ?Constitutional:   ?   Appearance: Normal appearance.  ?HENT:  ?   Head: Normocephalic and atraumatic.  ?Cardiovascular:  ?   Rate and Rhythm: Normal rate and regular rhythm.  ?Pulmonary:  ?   Effort: Pulmonary effort is normal. No respiratory distress.  ?   Breath sounds: No wheezing or rales.  ?Abdominal:  ?   General: There is no distension.  ?   Tenderness: There is no abdominal tenderness.  ?Musculoskeletal:     ?   General: No tenderness.  ?   Cervical back: Normal range of motion and neck supple.  ?   Right lower leg: Edema (2+ pitting) present.  ?   Left lower leg: Edema (2+ pitting) present.  ?Skin: ?   General: Skin is warm and dry.  ?Neurological:  ?   General: No focal deficit present.  ?   Mental Status: She is alert and oriented to person, place, and time.  ?Psychiatric:     ?   Mood and Affect: Mood normal.     ?   Behavior: Behavior normal.     ?   Thought Content: Thought content normal.  ?  ?Assessment & Plan: ? ?1: Acute on Chronic heart failure with reduced ejection fraction- ?- NYHA class III ?- fluid overloaded today with 4 pound weight gain, worsening edema and worsening symptoms; worsening symptoms started after she ran out of farxiga (this has been  resumed 4 days ago) ?- will send for 65m IV lasix/ 44m PO potassium today ?- BMP/BNP to be checked ?- not weighing daily but does have working scales; emphasized that she weigh daily and reminded to call for an ov

## 2021-08-24 NOTE — Patient Instructions (Signed)
Continue weighing daily and call for an overnight weight gain of 3 pounds or more or a weekly weight gain of more than 5 pounds.   If you have voicemail, please make sure your mailbox is cleaned out so that we may leave a message and please make sure to listen to any voicemails.     

## 2021-08-24 NOTE — Progress Notes (Signed)
Today Molly Howard contacted me stating she has more fluid, she had increased her torsemide with no relief.  She states now has shortness of breath and does not want to go to ED.  Contacted HF clinic and got her in there.  Went to appt with her, she has edema in legs and belly.  She has a cough with it.  Lungs are clear.  She was short of breath when she reached the office.  She does not watch high sodium foods, Inetta Fermo discussed with her also about high sodium foods, adding salt and how much fluids.  She has been intaking too much fluids.  She denies chest pain.  Tina sent her for IV lasix and she will have a followup appt tomorrow.  Will continue to visit for heart failure, diet and medication mangement.  ? ?Palyn Scrima ?[place] EMT-Paramedic ?[phone] ?

## 2021-08-24 NOTE — Progress Notes (Deleted)
Patient ID: Molly Howard, female    DOB: Apr 29, 1986, 36 y.o.   MRN: [id number]  HPI  Ms Pais is a 36 y/o female with a history of DM, HTN, previous drug use and chronic heart failure.   Echo report from 06/30/21 reviewed and showed an EF of 30-35% along with mild MR. Echo report from 03/20/21 reviewed and showed an EF of 25-30% along with severely elevated PA pressure of 70.8 mmHg, mild LAE, mild MR and mild/moderate TR. Echo report from 10/11/20 reviewed and showed an EF of 30-35% along with moderate TR.   Cardiac MRI 05/19/21 showed an EF of 42%  Was in the ED 08/08/21 due to left shoulder impingement where she was evaluated and released. Was in the ED 07/21/21 due to increased swelling of her hands arms and face.  Also endorsing increased dyspnea and intermittent chest pain and 15 pound weight gain. Was in the ED 06/15/21 due to hyperglycemia where she was treated and released.    She presents today for a follow-up visit with a chief complaint of    She received 80mg  IV lasix 67meq PO potassium yesterday.   Past Medical History:  Diagnosis Date   Acid reflux    CHF (congestive heart failure) (HCC)    Chronic HFrEF (heart failure with reduced ejection fraction) ([place])    a. 10/2019 Echo: EF 35-40%, GrII DD; b. 05/2020 Echo: EF 20-25%, glob HK; c. 10/2020 Echo: EF 30-35%, glob HK. GrII DD, Mildly red RV fxn. Mod TR.   CKD (chronic kidney disease), stage II    Diabetes mellitus ([place])    H/O medication noncompliance    Hypertension    Microcytic anemia    NICM (nonischemic cardiomyopathy) ([place])    a. 10/2019 Echo: EF 35-40%; b. 10/2019 MV: No ischemia. Small apical defect-->breast attenuation; c. 05/2020 Echo: EF 20-25%; d. 10/2020 Echo: EF 30-35%, glob HK. GrII DD, Mildly red RV fxn. Mod TR.   Obesity    Polysubstance abuse ([place])    Past Surgical History:  Procedure Laterality Date   CHOLECYSTECTOMY     HERNIA REPAIR     Family History  Problem Relation Age of Onset   Heart failure Mother         a. onset in her 53s   Diabetes Mother    Hypertension Father    Diabetes Father    Social History   Tobacco Use   Smoking status: Former    Packs/day: 0.50    Types: Cigarettes    Quit date: 2022    Years since quitting: 1.3   Smokeless tobacco: Never  Substance Use Topics   Alcohol use: Yes    Comment: ~ 4 shots/day on weekends only   Allergies  Allergen Reactions   No [facility] Food Allergies Rash and Other (See Comments)    Lemons     Review of Systems  Constitutional:  Positive for fatigue. Negative for appetite change.  HENT:  Negative for congestion, postnasal drip and sore throat.   Eyes: Negative.   Respiratory:  Positive for cough and shortness of breath.   Cardiovascular:  Positive for palpitations and leg swelling (left side). Negative for chest pain.  Gastrointestinal:  Negative for abdominal distention and abdominal pain.  Endocrine: Negative.   Genitourinary: Negative.   Musculoskeletal:  Positive for back pain. Negative for arthralgias.  Skin: Negative.   Allergic/Immunologic: Negative.   Neurological:  Positive for light-headedness. Negative for dizziness.  Hematological:  Negative for adenopathy. Does not  bruise/bleed easily.  Psychiatric/Behavioral:  Positive for sleep disturbance (sleeping on 2 pillows). Negative for dysphoric mood. The patient is not nervous/anxious.       Physical Exam Vitals and nursing note reviewed.  Constitutional:      Appearance: Normal appearance.  HENT:     Head: Normocephalic and atraumatic.  Cardiovascular:     Rate and Rhythm: Normal rate and regular rhythm.  Pulmonary:     Effort: Pulmonary effort is normal. No respiratory distress.     Breath sounds: No wheezing or rales.  Abdominal:     General: There is no distension.     Tenderness: There is no abdominal tenderness.  Musculoskeletal:        General: No tenderness.     Cervical back: Normal range of motion and neck supple.     Right lower leg:  Edema (2+ pitting) present.     Left lower leg: Edema (2+ pitting) present.  Skin:    General: Skin is warm and dry.  Neurological:     General: No focal deficit present.     Mental Status: She is alert and oriented to person, place, and time.  Psychiatric:        Mood and Affect: Mood normal.        Behavior: Behavior normal.        Thought Content: Thought content normal.    Assessment & Plan:  1: Acute on Chronic heart failure with reduced ejection fraction- - NYHA class III - fluid overloaded today with 4 pound weight gain, worsening edema and worsening symptoms; worsening symptoms started after she ran out of farxiga (this has been resumed 4 days ago) - will send for 80mg  IV lasix/ 73meq PO potassium yesterday - not weighing daily but does have working scales; emphasized that she weigh daily and reminded to call for an overnight weight gain of > 2 pounds or a weekly weight gain of > 5 pounds - weight up 4 pounds from last visit here 3 weeks ago - saw cardiology ([place]) 07/02/21; returns 09/03/21 - saw EP Quentin Ore) 04/08/21  - on GDMT of carvedilol, entresto, farxiga and spironolactone (farxiga just resumed 4 days ago after paramedic picked it up and delivered it) - again, emphasized the importance of keeping daily fluid intake to 60-64 ounces/ daily which would include the ice she's eating - has been using seasoning salt and discussed not using that for anything; can use garlic/onion powder, Mrs Deliah Boston, pepper or other seasonings that don't contain salt; explained that it can take awhile for the tastebuds to get used to the food - participating in paramedicine program & paramedic was present during clinic visit today - BNP 08/24/21 was 781.6  2: HTN- - BP  - saw PCP Rosana Berger) 07/16/21 - BMP 08/24/21 reviewed and showed sodium 138, potassium 3.4, creatinine 0.95 & GFR >60  3: DM- - A1c 06/12/21 was 10.5% - fasting glucose at home today was    Patient did not bring her medications  nor a list. Each medication was verbally reviewed with the patient and she was encouraged to bring the bottles to every visit to confirm accuracy of list.

## 2021-08-25 ENCOUNTER — Other Ambulatory Visit: Payer: Self-pay

## 2021-08-25 ENCOUNTER — Telehealth: Payer: Self-pay | Admitting: Family

## 2021-08-25 ENCOUNTER — Ambulatory Visit: Payer: Medicaid Other | Admitting: Family

## 2021-08-25 ENCOUNTER — Other Ambulatory Visit (HOSPITAL_COMMUNITY): Payer: Self-pay

## 2021-08-25 NOTE — Telephone Encounter (Signed)
Patient missed her follow up appointment with Clarisa Kindred, FNP that was scheduled after we added her to the schedule yesterday 4/24 due to fluid retention and sent her to same day surgery for IV lasix with the understanding she was suppose to follow up with [place] today and she did not show up. We originally had her scheduled for this morning and she could not make it due to transportation so she asked to move it to this afternoon and she still couldn't come. We rescheduled her for a couple weeks out. ? ? ? ?Alyx Gee, NT ?

## 2021-08-25 NOTE — Progress Notes (Signed)
Reached to Molly Howard this morning after receiving a message she was needing help with food in her home.  She has a followup appt with Inetta Fermo at HF clinic, discussed with her of providing her resources throughout the [place] for food banks.  Advised Almee to get the information from [place] at her appt.  She then advised she forgot to get her sisters car and can not make the morning appt, asked if she could make an afternoon and she advised yes.  Rescheduled her appt and when she did not show for the afternoon one she advised she was busy getting food for her home.  She lives with her Mom and they both get food stamps.  Have in the past provided her with groceries and yesterday a gas card was provided for her to make her appt.  HF clinic rescheduled her appt for 2 weeks out and advised her also.   ? ?Brandun Pinn ?[place] EMT-Paramedic ?[phone] ?

## 2021-08-26 ENCOUNTER — Ambulatory Visit: Payer: Medicaid Other | Admitting: Family

## 2021-08-26 ENCOUNTER — Encounter: Payer: Medicaid Other | Admitting: Cardiology

## 2021-08-27 ENCOUNTER — Telehealth (HOSPITAL_COMMUNITY): Payer: Self-pay

## 2021-08-27 NOTE — Telephone Encounter (Signed)
Molly Howard was given IV lasix at her HF clinic appt this week.  She did not show for her appt afterwards.  Attempted to contact by phone and text but no response back.  Will continue to try to contact.  ? ?Tamera Pingley ?[place] EMT-Paramedic ?[phone] ?

## 2021-09-03 ENCOUNTER — Ambulatory Visit: Payer: Medicaid Other | Admitting: Cardiology

## 2021-09-08 ENCOUNTER — Ambulatory Visit: Payer: Medicaid Other | Admitting: Family

## 2021-09-08 ENCOUNTER — Encounter (HOSPITAL_COMMUNITY): Payer: Self-pay

## 2021-09-11 ENCOUNTER — Other Ambulatory Visit (HOSPITAL_COMMUNITY): Payer: Self-pay

## 2021-09-11 ENCOUNTER — Ambulatory Visit: Payer: Medicaid Other | Admitting: Family

## 2021-09-14 ENCOUNTER — Other Ambulatory Visit
Admission: RE | Admit: 2021-09-14 | Discharge: 2021-09-14 | Disposition: A | Discharge status: Home / Self Care | Payer: Medicaid Other | Attending: Medical | Admitting: Medical

## 2021-09-14 ENCOUNTER — Encounter: Payer: Self-pay | Admitting: Medical

## 2021-09-14 ENCOUNTER — Ambulatory Visit (INDEPENDENT_AMBULATORY_CARE_PROVIDER_SITE_OTHER): Payer: Medicaid Other | Admitting: Medical

## 2021-09-14 VITALS — BP 120/70 | HR 92 | Ht 67.0 in | Wt 203.0 lb

## 2021-09-14 DIAGNOSIS — I5042 Chronic combined systolic (congestive) and diastolic (congestive) heart failure: Secondary | ICD-10-CM

## 2021-09-14 DIAGNOSIS — I10 Essential (primary) hypertension: Secondary | ICD-10-CM | POA: Diagnosis not present

## 2021-09-14 DIAGNOSIS — E782 Mixed hyperlipidemia: Secondary | ICD-10-CM

## 2021-09-14 DIAGNOSIS — I428 Other cardiomyopathies: Secondary | ICD-10-CM | POA: Diagnosis not present

## 2021-09-14 LAB — BASIC METABOLIC PANEL
Anion gap: 6 (ref 5–15)
BUN: 16 mg/dL (ref 6–20)
CO2: 29 mmol/L (ref 22–32)
Calcium: 7.7 mg/dL — ABNORMAL LOW (ref 8.9–10.3)
Chloride: 101 mmol/L (ref 98–111)
Creatinine, Ser: 1.05 mg/dL — ABNORMAL HIGH (ref 0.44–1.00)
GFR, Estimated: >60 mL/min (ref >60)
Glucose, Bld: 247 mg/dL — ABNORMAL HIGH (ref 70–99)
Potassium: 3.7 mmol/L (ref 3.5–5.1)
Sodium: 136 mmol/L (ref 135–145)

## 2021-09-14 LAB — BRAIN NATRIURETIC PEPTIDE: B Natriuretic Peptide: 632.7 pg/mL — ABNORMAL HIGH (ref 0.0–100.0)

## 2021-09-14 NOTE — Progress Notes (Signed)
?Cardiology Office Note:   ? ?Date:  09/14/2021  ? ?ID:  Molly Howard, DOB 03-08-1986, MRN [id number] ? ?PCP:  [name], DO  ?[facility] Cardiologist:  [name], MD  ?[facility] Electrophysiologist:  Vickie Epley, MD  ? ?Referring MD: [name], DO  ? ?Chief Complaint: 2 month follow-up ? ?History of Present Illness:   ? ?Molly Howard is a 36 y.o. female with a hx of NICM EF low as 20-25%, last EF 30-35%, hyperlipidemia, diabetes, former smoker x 104yr, polysubstance abuse, obesity presenting for follow-up.   ? ?H/o NICM with EF as low as 30-35% in the past. CMR showed EF 42%. Patient saw EP for ICD consideration, however this was canceled due to improved EF. Most recent echo 06/2021 showed LVEF 30-35%, G2DD, mild MR ? ?Last seen 07/02/21 and plan was to continue GDMT and  re-check echo. If EF stays below 35%, may need to reconsider ICD.  ? ?Seen by TDarylene Price2/24/23 and was sent for IV lasix and potassium. BNP was >781.  ? ?Today, the patient reports weight is up more than 5lbs, although weight is the same as the last visit. 203lbs. She has SOB that is the same. She reports LLE, worse on the left. Occasional chest pain at night when she lays down. She lays on her side. She is taking Torsemide 415mdaily. She reports she has been taking torsemide 4037mID for about 1 month because her weight has been up. In review of Tina Hackney's note, it doesn't specify Torsemide dose. The patient is following fluid restriction. She is using low salt.  ? ?Past Medical History:  ?Diagnosis Date  ? Acid reflux   ? CH[place] ([place]estive heart failure) ([place] ? Chronic [place]EF (heart failure with reduced [place] fraction) [place] ? a. 10/2019 Echo: EF 35-40%, GrII DD; b. 05/2020 Echo: EF 20-25%, glob HK; c. 10/2020 Echo: EF 30-35%, glob HK. GrII DD, Mildly red RV fxn. Mod TR.  ? CKD (chronic kidney disease), stage II   ? Diabetes mellitus ([place] ? H/O medication noncompliance   ? Hypertension   ?  Microcytic anemia   ? NIC[place] (nonischemic cardiomyopathy) ([place] ? a. 10/2019 Echo: EF 35-40%; b. 10/2019 MV: No ischemia. Small apical defect-->breast attenuation; c. 05/2020 Echo: EF 20-25%; d. 10/2020 Echo: EF 30-35%, glob HK. GrII DD, Mildly red RV fxn. Mod TR.  ? Obesity   ? Polysubstance abuse ([place] ? ? ?Past Surgical History:  ?Procedure Laterality Date  ? CHOLECYSTECTOMY    ? HERNIA REPAIR    ? ? ?Current Medications: ?Current Meds  ?Medication Sig  ? acetaminophen (TYLENOL) 500 MG tablet Take 1,000 mg by mouth every 6 (six) hours as needed (for pain.).  ? atorvastatin (LIPITOR) 40 MG tablet Take 1 tablet (40 mg total) by mouth daily. (Patient taking differently: Take 20 mg by mouth daily.)  ? blood glucose meter kit and supplies KIT Dispense based on patient and insurance preference. Use up to four times daily as directed. (FOR ICD-9 250.00, 250.01).  ? carvedilol (COREG) 6.25 MG tablet Take 1 tablet (6.25 mg total) by mouth 2 (two) times daily with a meal.  ? dapagliflozin propanediol (FARXIGA) 10 MG TABS tablet Take 1 tablet (10 mg total) by mouth once daily.  ? famotidine (PEPCID) 20 MG tablet Take 1 tablet (20 mg total) by mouth 2 (two) times daily.  ? insulin glargine (LANTUS SOLOSTAR) 100 UNIT/ML Solostar Pen  Inject 6 Units into the skin 2 (two) times daily.  ? Insulin Pen Needle (PEN NEEDLES 3/16") 31G X 5 MM MISC 1 each by Does not apply route 2 (two) times daily.  ? meloxicam (MOBIC) 15 MG tablet Take 1 tablet (15 mg total) by mouth daily.  ? norethindrone (AYGESTIN) 5 MG tablet Take 1 tablet (5 mg total) by mouth daily.  ? potassium chloride SA (KLOR-CON M) 20 MEQ tablet Take 0.5 tablets (10 mEq total) by mouth daily.  ? sacubitril-valsartan (ENTRESTO) 97-103 MG Take 1 tablet by mouth 2 (two) times daily.  ? spironolactone (ALDACTONE) 25 MG tablet Take 1 tablet (25 mg total) by mouth once daily.  ? torsemide (DEMADEX) 20 MG tablet Take 40 mg by mouth daily.  ?  ? ?Allergies:   No healthtouch food  allergies  ? ?Social History  ? ?Socioeconomic History  ? Marital status: Single  ?  Spouse name: Not on file  ? Number of children: Not on file  ? Years of education: Not on file  ? Highest education level: Not on file  ?Occupational History  ? Not on file  ?Tobacco Use  ? Smoking status: Former  ?  Packs/day: 0.50  ?  Types: Cigarettes  ?  Quit date: 2022  ?  Years since quitting: 1.3  ? Smokeless tobacco: Never  ?Vaping Use  ? Vaping Use: Never used  ?Substance and Sexual Activity  ? Alcohol use: Yes  ?  Comment: ~ 4 shots/day on weekends only  ? Drug use: Not Currently  ?  Types: Cocaine  ?  Comment: Last day of use was prior to June 2021 hosp.visit.   ? Sexual activity: Not Currently  ?  Birth control/protection: None  ?Other Topics Concern  ? Not on file  ?Social History Narrative  ? Not on file  ? ?Social Determinants of Health  ? ?Financial Resource Strain: Not on file  ?Food Insecurity: Not on file  ?Transportation Needs: Not on file  ?Physical Activity: Not on file  ?Stress: Not on file  ?Social Connections: Not on file  ?  ? ?Family History: ?The patient's family history includes Diabetes in her father and mother; Heart failure in her mother; Hypertension in her father. ? ?ROS:   ?Please see the history of present illness.    ? All other systems reviewed and are negative. ? ?EKGs/Labs/Other Studies Reviewed:   ? ?The following studies were reviewed today: ? ?Echo 06/2021 ? 1. Left ventricular ejection fraction, by estimation, is 30 to 35%. The  ?left ventricle has moderately decreased function. The left ventricle  ?demonstrates global hypokinesis. The left ventricular internal cavity size  ?was mildly dilated. Left ventricular  ?diastolic parameters are consistent with Grade II diastolic dysfunction  ?(pseudonormalization).  ? 2. Right ventricular systolic function is normal. The right ventricular  ?size is normal. Tricuspid regurgitation signal is inadequate for assessing  ?PA pressure.  ? 3. The mitral  valve is normal in structure. Mild mitral valve  ?regurgitation. No evidence of mitral stenosis.  ? 4. The aortic valve is tricuspid. Aortic valve regurgitation is not  ?visualized. No aortic stenosis is present.  ? 5. The inferior vena cava is normal in size with greater than 50%  ?respiratory variability, suggesting right atrial pressure of 3 mmHg.  ? ?cMRI 05/2021 ?IMPRESSION: ?1. Mild LV dilatation, mild hypertrophy, and mild systolic ?dysfunction (EF 42%) ?  ?2.  No late gadolinium enhancement to suggest myocardial scar ?  ?3.  Normal  RV size and systolic function (EF 32%) ?  ?  ?Electronically Signed ?  By: Oswaldo Milian M.D. ?  On: 05/21/2021 22:24 ?  ? ?Echo 03/2021 ? 1. Left ventricular ejection fraction, by estimation, is 25 to 30%. The  ?left ventricle has severely decreased function. The left ventricle  ?demonstrates global hypokinesis. Left ventricular diastolic parameters are  ?consistent with Grade II diastolic  ?dysfunction (pseudonormalization). The average left ventricular global  ?longitudinal strain is -9.2 %. The global longitudinal strain is abnormal.  ? 2. Right ventricular systolic function is mildly reduced. The right  ?ventricular size is mildly enlarged. There is severely elevated pulmonary  ?artery systolic pressure. The estimated right ventricular systolic  ?pressure is 00.3 mmHg.  ? 3. Left atrial size was mildly dilated.  ? 4. The mitral valve is normal in structure. Mild mitral valve  ?regurgitation. No evidence of mitral stenosis.  ? 5. Tricuspid valve regurgitation is mild to moderate.  ? 6. The inferior vena cava is dilated in size with <50% respiratory  ?variability, suggesting right atrial pressure of 15 mmHg.  ? ?Comparison(s): Peak TV PG reported as 32mHg on previous exam.  ? ?EKG:  EKG is  ordered today.  The ekg ordered today demonstrates NSR, 92bpm, TWI aVL, no changes ? ?Recent Labs: ?11/28/2020: Magnesium 1.6 ?07/21/2021: ALT 13; Hemoglobin 10.5; Platelets  377 ?08/24/2021: B Natriuretic Peptide 781.6; BUN 13; Creatinine, Ser 0.95; Potassium 3.4; Sodium 138  ?Recent Lipid Panel ?   ?Component Value Date/Time  ? CHOL 286 (H) 06/12/2021 1354  ? TRIG 242 (H) 06/12/2021 1354

## 2021-09-14 NOTE — Patient Instructions (Signed)
Medication Instructions:  ?Your physician recommends that you continue on your current medications as directed. Please refer to the Current Medication list given to you today. ? ?*If you need a refill on your cardiac medications before your next appointment, please call your pharmacy* ? ? ?Lab Work: ?Bmp and Bnp today. ? ?Please have your labs drawn at the [facility]. Stop at the Registration desk to check. ? ?If you have labs (blood work) drawn today and your tests are completely normal, you will receive your results only by: ?MyChart Message (if you have MyChart) OR ?A paper copy in the mail ?If you have any lab test that is abnormal or we need to change your treatment, we will call you to review the results. ? ? ?Testing/Procedures: ?Your physician has requested that you have an echocardiogram. Echocardiography is a painless test that uses sound waves to create images of your heart. It provides your doctor with information about the size and shape of your heart and how well your heart?s chambers and valves are working. This procedure takes approximately one hour. There are no restrictions for this procedure. ? ? ? ?Follow-Up: ?At [facility], you and your health needs are our priority.  As part of our continuing mission to provide you with exceptional heart care, we have created designated Provider Care Teams.  These Care Teams include your primary Cardiologist (physician) and Advanced Practice Providers (APPs -  Physician Assistants and Nurse Practitioners) who all work together to provide you with the care you need, when you need it. ? ?We recommend signing up for the patient portal called "MyChart".  Sign up information is provided on this After Visit Summary.  MyChart is used to connect with patients for Virtual Visits (Telemedicine).  Patients are able to view lab/test results, encounter notes, upcoming appointments, etc.  Non-urgent messages can be sent to your provider as well.   ?To learn more  about what you can do with MyChart, go to [email or web address].   ? ?Your next appointment:   ?3-4 week(s) ? ?The format for your next appointment:   ?In Person ? ?Provider:   ?You may see Debbe Odea, MD or one of the following Advanced Practice Providers on your designated Care Team:   ?Nicolasa Ducking, NP ?Eula Listen, PA-C ?Cadence Fransico Michael, PA-C ? ? ?Other Instructions ?N/A ? ?Important Information About Sugar ? ? ? ? ? ? ?

## 2021-09-15 ENCOUNTER — Telehealth: Payer: Self-pay | Admitting: Medical

## 2021-09-15 NOTE — Telephone Encounter (Signed)
Molly Howard, EMT  Parris-Godley, Monia Sabal, RN ?Seen your notes in epic.  She can only receive text right now.  I advised the results to her and to continue 40 mg torsemide twice daily and she can take extra dose of torsemide for next 3 days in the am.    ? ?Thank you  ?

## 2021-09-15 NOTE — Telephone Encounter (Signed)
Molly Howard, [name], PA-C ?It looks like BNP is trending down, but still abnormal. Continue taking torsemide 40mg  twice daily. She can take an extra torsemide dose in the morning for the next 3 days.  ?

## 2021-09-15 NOTE — Telephone Encounter (Signed)
Attempted to call the patient x2 with results. Pt phone does not ring and message sts that the call cannot be completed at this time. ?

## 2021-09-18 ENCOUNTER — Ambulatory Visit (INDEPENDENT_AMBULATORY_CARE_PROVIDER_SITE_OTHER): Payer: Medicaid Other

## 2021-09-18 DIAGNOSIS — I428 Other cardiomyopathies: Secondary | ICD-10-CM

## 2021-09-18 LAB — ECHOCARDIOGRAM LIMITED
Area-P 1/2: 6.32 cm2
Calc EF: 15.7 %
S' Lateral: 5.1 cm
Single Plane A2C EF: 10.8 %
Single Plane A4C EF: 19.1 %

## 2021-09-22 ENCOUNTER — Telehealth: Payer: Self-pay

## 2021-09-22 ENCOUNTER — Telehealth: Payer: Self-pay | Admitting: Medical

## 2021-09-22 DIAGNOSIS — I428 Other cardiomyopathies: Secondary | ICD-10-CM

## 2021-09-22 DIAGNOSIS — I5020 Unspecified systolic (congestive) heart failure: Secondary | ICD-10-CM

## 2021-09-22 NOTE — Telephone Encounter (Signed)
-----   Message from Cadence David Stall, PA-C sent at 09/21/2021 11:26 AM EDT ----- Echo showed persistently low pump function. She needs referral to EP for ICD consideration.

## 2021-09-22 NOTE — Telephone Encounter (Signed)
Patient made aware of echo results and Cadence's recommendation. Patient is agreeable with the referral to EP. Adv the patient that scheduling will call her to schedule.

## 2021-09-22 NOTE — Telephone Encounter (Signed)
Called to give the patient echo results. Lmtcb. 

## 2021-09-23 NOTE — Telephone Encounter (Signed)
L vm to schedule EP consult

## 2021-10-06 NOTE — Progress Notes (Unsigned)
Electrophysiology Office Follow up Visit Note:    Date:  10/07/2021   ID:  Molly Howard, DOB 1985-11-27, MRN [id number]  PCP:  [name], DO  [facility] Cardiologist:  [name], MD  [facility] Electrophysiologist:  Vickie Epley, MD    Interval History:    Molly Howard is a 36 y.o. female who presents for a follow up visit. They were last seen in clinic April 08, 2021 for her chronic systolic heart failure.  At that appointment we plan for ICD implant after a cardiac MRI.  I also referred her to Arlie Solomons with genetics given a strong family history of chronic systolic heart failure.  Her ICD implant was scheduled for January 23 but this was canceled because the EF had improved to greater than 35%.  An echo was performed Sep 18, 2021 which showed worsening LV function, EF 25%.  The RV function was mildly reduced.  The patient saw Cadence in clinic Sep 14, 2021.  At that appointment she reported worsening heart failure symptoms.  She presents back to clinic today to discuss ICD implant.  She tells me that she recently passed out after standing.  She felt lightheaded and lost consciousness.  She hurt her left shoulder but is recovering.  She has been compliant with her medications including Entresto, Coreg, Farxiga.     Past Medical History:  Diagnosis Date   Acid reflux    CHF (congestive heart failure) (HCC)    Chronic HFrEF (heart failure with reduced ejection fraction) ([place])    a. 10/2019 Echo: EF 35-40%, GrII DD; b. 05/2020 Echo: EF 20-25%, glob HK; c. 10/2020 Echo: EF 30-35%, glob HK. GrII DD, Mildly red RV fxn. Mod TR.   CKD (chronic kidney disease), stage II    Diabetes mellitus ([place])    H/O medication noncompliance    Hypertension    Microcytic anemia    NICM (nonischemic cardiomyopathy) ([place])    a. 10/2019 Echo: EF 35-40%; b. 10/2019 MV: No ischemia. Small apical defect-->breast attenuation; c. 05/2020 Echo: EF 20-25%; d. 10/2020 Echo: EF 30-35%,  glob HK. GrII DD, Mildly red RV fxn. Mod TR.   Obesity    Polysubstance abuse ([place])     Past Surgical History:  Procedure Laterality Date   CHOLECYSTECTOMY     HERNIA REPAIR      Current Medications: Current Meds  Medication Sig   acetaminophen (TYLENOL) 500 MG tablet Take 1,000 mg by mouth every 6 (six) hours as needed (for pain.).   atorvastatin (LIPITOR) 40 MG tablet Take 1 tablet (40 mg total) by mouth daily. (Patient taking differently: Take 20 mg by mouth daily.)   blood glucose meter kit and supplies KIT Dispense based on patient and insurance preference. Use up to four times daily as directed. (FOR ICD-9 250.00, 250.01).   carvedilol (COREG) 6.25 MG tablet Take 1 tablet (6.25 mg total) by mouth 2 (two) times daily with a meal.   dapagliflozin propanediol (FARXIGA) 10 MG TABS tablet Take 1 tablet (10 mg total) by mouth once daily.   famotidine (PEPCID) 20 MG tablet Take 1 tablet (20 mg total) by mouth 2 (two) times daily.   insulin glargine (LANTUS SOLOSTAR) 100 UNIT/ML Solostar Pen Inject 6 Units into the skin 2 (two) times daily.   Insulin Pen Needle (PEN NEEDLES 3/16") 31G X 5 MM MISC 1 each by Does not apply route 2 (two) times daily.   meloxicam (MOBIC) 15 MG tablet Take 1 tablet (15  mg total) by mouth daily.   norethindrone (AYGESTIN) 5 MG tablet Take 1 tablet (5 mg total) by mouth daily.   potassium chloride SA (KLOR-CON M) 20 MEQ tablet Take 0.5 tablets (10 mEq total) by mouth daily.   sacubitril-valsartan (ENTRESTO) 97-103 MG Take 1 tablet by mouth 2 (two) times daily.   spironolactone (ALDACTONE) 25 MG tablet Take 1 tablet (25 mg total) by mouth once daily.   torsemide (DEMADEX) 20 MG tablet Take 40 mg by mouth daily.     Allergies:   No healthtouch food allergies   Social History   Socioeconomic History   Marital status: Single    Spouse name: Not on file   Number of children: Not on file   Years of education: Not on file   Highest education level: Not on file   Occupational History   Not on file  Tobacco Use   Smoking status: Former    Packs/day: 0.50    Types: Cigarettes    Quit date: 2022    Years since quitting: 1.4   Smokeless tobacco: Never  Vaping Use   Vaping Use: Never used  Substance and Sexual Activity   Alcohol use: Yes    Comment: ~ 4 shots/day on weekends only   Drug use: Not Currently    Types: Cocaine    Comment: Last day of use was prior to June 2021 hosp.visit.    Sexual activity: Not Currently    Birth control/protection: None  Other Topics Concern   Not on file  Social History Narrative   Not on file   Social Determinants of Health   Financial Resource Strain: Not on file  Food Insecurity: Not on file  Transportation Needs: Not on file  Physical Activity: Not on file  Stress: Not on file  Social Connections: Not on file     Family History: The patient's family history includes Diabetes in her father and mother; Heart failure in her mother; Hypertension in her father.  ROS:   Please see the history of present illness.    All other systems reviewed and are negative.  EKGs/Labs/Other Studies Reviewed:    The following studies were reviewed today:  Sep 14, 2021 EKG shows sinus rhythm, narrow QRS 86 ms  Today's EKG shows sinus rhythm with a ventricular rate of 98 bpm.  Recent Labs: 11/28/2020: Magnesium 1.6 07/21/2021: ALT 13; Hemoglobin 10.5; Platelets 377 09/14/2021: B Natriuretic Peptide 632.7; BUN 16; Creatinine, Ser 1.05; Potassium 3.7; Sodium 136  Recent Lipid Panel    Component Value Date/Time   CHOL 286 (H) 06/12/2021 1354   TRIG 242 (H) 06/12/2021 1354   HDL 74 06/12/2021 1354   CHOLHDL 3.9 06/12/2021 1354   VLDL 24 05/21/2020 0643   LDLCALC 171 (H) 06/12/2021 1354    Physical Exam:    VS:  BP (!) 142/84   Pulse 98   Ht _0  (1.702 m)   Wt 213 lb (96.6 kg)   SpO2 96%   BMI 33.36 kg/m     Wt Readings from Last 3 Encounters:  10/07/21 213 lb (96.6 kg)  09/14/21 203 lb (92.1 kg)   08/24/21 203 lb (92.1 kg)     GEN:  Well nourished, well developed in no acute distress HEENT: Normal NECK: No JVD; No carotid bruits LYMPHATICS: No lymphadenopathy CARDIAC: RRR, no murmurs, rubs, gallops RESPIRATORY:  Clear to auscultation without rales, wheezing or rhonchi  ABDOMEN: Soft, non-tender, non-distended MUSCULOSKELETAL: Trace bilateral pitting lower extremity edema; No deformity  SKIN: Warm and dry NEUROLOGIC:  Alert and oriented x 3 PSYCHIATRIC:  Normal affect        ASSESSMENT:    1. HFrEF (heart failure with reduced ejection fraction) ([place])   2. Chronic combined systolic and diastolic heart failure ([place])   3. NICM (nonischemic cardiomyopathy) ([place])   4. Essential hypertension    PLAN:    In order of problems listed above:  #Chronic systolic heart failure NYHA class II-III.  Warm and slightly volume overloaded on exam today.  On good medical therapy with Coreg, Lisabeth Register and torsemide.  Discussed risk for sudden cardiac death during today's appointment.  Given her persistently reduced left ventricular function, I do think she is at an increased risk for sudden cardiac death.  She has no pacing needs.  Given her young age and lack of pacing needs, we will plan for a subcutaneous ICD.  I discussed the procedure in detail with the patient including the risks and recovery and she wishes to proceed.  We will get this scheduled for her.  The patient has a nonischemic CM (EF 25%), NYHA Class II-III CHF.  She is referred by Dr Garen Lah for risk stratification of sudden death and consideration of ICD implantation.  At this time, she meets criteria for ICD implantation for primary prevention of sudden death.  I have had a thorough discussion with the patient reviewing options.  The patient and their family (if available) have had opportunities to ask questions and have them answered. The patient and I have decided together through a shared decision making process to  proceed with ICD implant at this time.    Risks, benefits, alternatives to ICD implantation were discussed in detail with the patient today. The patient understands that the risks include but are not limited to bleeding, infection, pneumothorax, perforation, tamponade, vascular damage, renal failure, MI, stroke, death, inappropriate shocks, and lead dislodgement and wishes to proceed.  We will therefore schedule device implantation at the next available time.  I will have her increase her potassium/torsemide to twice daily for the next 2 days and then return to once daily dosing.  #Hypertension Largely controlled on her medical therapy.  Continue current medications including Entresto, torsemide, Farxiga and Coreg.  Medication compliance encouraged.      Total time spent with patient today 45 minutes. This includes reviewing records, evaluating the patient and coordinating care.   Medication Adjustments/Labs and Tests Ordered: Current medicines are reviewed at length with the patient today.  Concerns regarding medicines are outlined above.  Orders Placed This Encounter  Procedures   Basic Metabolic Panel (BMET)   CBC w/Diff   EKG 12-Lead   No orders of the defined types were placed in this encounter.    Signed, Lars Mage, MD, [facility], [facility] 10/07/2021 8:24 AM    Electrophysiology [facility]

## 2021-10-07 ENCOUNTER — Ambulatory Visit (INDEPENDENT_AMBULATORY_CARE_PROVIDER_SITE_OTHER): Payer: Medicaid Other | Admitting: Cardiology

## 2021-10-07 ENCOUNTER — Telehealth: Payer: Self-pay | Admitting: *Deleted

## 2021-10-07 ENCOUNTER — Encounter: Payer: Self-pay | Admitting: *Deleted

## 2021-10-07 ENCOUNTER — Encounter: Payer: Self-pay | Admitting: Cardiology

## 2021-10-07 ENCOUNTER — Other Ambulatory Visit
Admission: RE | Admit: 2021-10-07 | Discharge: 2021-10-07 | Disposition: A | Discharge status: Home / Self Care | Payer: Medicaid Other | Attending: Cardiology | Admitting: Cardiology

## 2021-10-07 VITALS — BP 142/84 | HR 98 | Ht 67.0 in | Wt 213.0 lb

## 2021-10-07 DIAGNOSIS — I10 Essential (primary) hypertension: Secondary | ICD-10-CM | POA: Diagnosis present

## 2021-10-07 DIAGNOSIS — I5042 Chronic combined systolic (congestive) and diastolic (congestive) heart failure: Secondary | ICD-10-CM

## 2021-10-07 DIAGNOSIS — I5020 Unspecified systolic (congestive) heart failure: Secondary | ICD-10-CM

## 2021-10-07 DIAGNOSIS — I428 Other cardiomyopathies: Secondary | ICD-10-CM | POA: Diagnosis present

## 2021-10-07 LAB — BASIC METABOLIC PANEL
Anion gap: 8 (ref 5–15)
BUN: 25 mg/dL — ABNORMAL HIGH (ref 6–20)
CO2: 24 mmol/L (ref 22–32)
Calcium: 8.1 mg/dL — ABNORMAL LOW (ref 8.9–10.3)
Chloride: 104 mmol/L (ref 98–111)
Creatinine, Ser: 1.2 mg/dL — ABNORMAL HIGH (ref 0.44–1.00)
GFR, Estimated: 60 mL/min — ABNORMAL LOW (ref >60)
Glucose, Bld: 260 mg/dL — ABNORMAL HIGH (ref 70–99)
Potassium: 4.3 mmol/L (ref 3.5–5.1)
Sodium: 136 mmol/L (ref 135–145)

## 2021-10-07 LAB — CBC WITH DIFFERENTIAL/PLATELET
Abs Immature Granulocytes: 0.02 10*3/uL (ref 0.00–0.07)
Basophils Absolute: 0.1 10*3/uL (ref 0.0–0.1)
Basophils Relative: 1 %
Eosinophils Absolute: 0.2 10*3/uL (ref 0.0–0.5)
Eosinophils Relative: 3 %
HCT: 27.1 % — ABNORMAL LOW (ref 36.0–46.0)
Hemoglobin: 8.1 g/dL — ABNORMAL LOW (ref 12.0–15.0)
Immature Granulocytes: 0 %
Lymphocytes Relative: 21 %
Lymphs Abs: 1.4 10*3/uL (ref 0.7–4.0)
MCH: 24.4 pg — ABNORMAL LOW (ref 26.0–34.0)
MCHC: 29.9 g/dL — ABNORMAL LOW (ref 30.0–36.0)
MCV: 81.6 fL (ref 80.0–100.0)
Monocytes Absolute: 0.5 10*3/uL (ref 0.1–1.0)
Monocytes Relative: 7 %
Neutro Abs: 4.5 10*3/uL (ref 1.7–7.7)
Neutrophils Relative %: 68 %
Platelets: 476 10*3/uL — ABNORMAL HIGH (ref 150–400)
RBC: 3.32 MIL/uL — ABNORMAL LOW (ref 3.87–5.11)
RDW: 16.8 % — ABNORMAL HIGH (ref 11.5–15.5)
WBC: 6.7 10*3/uL (ref 4.0–10.5)
nRBC: 0 % (ref 0.0–0.2)

## 2021-10-07 NOTE — Telephone Encounter (Signed)
Advised the patient that her blood work today had a low hemoglobin, therefore we have to put her ICD on hold. She is to follow up with PCP for evaluation. Canceled procedure.  Patient verbalized understanding and agreement.  Lab routed to PCP.   need to cancel her subcutaneous ICD implant. she is significantly anemic. needs to see primary care for further workup.  -CL

## 2021-10-07 NOTE — Patient Instructions (Addendum)
Medications: Increase your Torsemide 40 mg to two times a day, for the next two days.   Increase your Potassium 10 Meq, to two times a day, for the next two days.   Your physician recommends that you continue on your current medications as directed. Please refer to the Current Medication list given to you today. *If you need a refill on your cardiac medications before your next appointment, please call your pharmacy*  Lab Work: CBC, BMP If you have labs (blood work) drawn today and your tests are completely normal, you will receive your results only by: [place] (if you have MyChart) OR A paper copy in the mail If you have any lab test that is abnormal or we need to change your treatment, we will call you to review the results   Testing/Procedures: Your physician has recommended that you have a defibrillator inserted. An implantable cardioverter defibrillator (ICD) is a small device that is placed in your chest or, in rare cases, your abdomen. This device uses electrical pulses or shocks to help control life-threatening, irregular heartbeats that could lead the heart to suddenly stop beating (sudden cardiac arrest). Leads are attached to the ICD that goes into your heart. This is done in the hospital and usually requires an overnight stay. Please see the instruction sheet given to you today for more information.   Follow-Up: At [facility], you and your health needs are our priority.  As part of our continuing mission to provide you with exceptional heart care, we have created designated Provider Care Teams.  These Care Teams include your primary Cardiologist (physician) and Advanced Practice Providers (APPs -  Physician Assistants and Nurse Practitioners) who all work together to provide you with the care you need, when you need it.  Your physician wants you to follow-up in: Procedure , June 12.    [facility] 10-14 days after your procedure. - You will follow up with  Dr. Quentin Ore 91 days after your procedure.  We recommend signing up for the patient portal called "MyChart".  Sign up information is provided on this After Visit Summary.  MyChart is used to connect with patients for Virtual Visits (Telemedicine).  Patients are able to view lab/test results, encounter notes, upcoming appointments, etc.  Non-urgent messages can be sent to your provider as well.   To learn more about what you can do with MyChart, go to [email or web address].    Any Other Special Instructions Will Be Listed Below (If Applicable).   Subcutaneous Cardioverter Defibrillator Implantation A subcutaneous implantable cardioverter defibrillator (S-ICD) is a device that identifies and corrects abnormal heart rhythms. Subcutaneous cardioverter defibrillator implantation is a surgery to place an S-ICD under the skin in your chest. An S-ICD has a battery, a small computer (pulse generator), and a device that moves electrical currents (electrode). The S-ICD detects and corrects two types of dangerous irregular heart rhythms (arrhythmias): A rapid heart rhythm in the lower chambers of the heart (ventricles). This is called ventricular tachycardia. The ventricles contracting in an uncoordinated way. This is called ventricular fibrillation. When the S-ICD detects ventricular tachycardia or fibrillation, it sends a shock to the heart that attempts to restore the heartbeat to normal (defibrillation). The shock may feel like a strong jolt in the chest. Your health care provider may recommend S-ICD implantation if: You had an arrhythmia that started in the ventricles. Your heart has damage from heart disease or a heart condition. Your heart muscle is weak. You have congenital heart  disease. Your health care provider may recommend implantation of an S-ICD instead of a traditional ICD if: You have a blood vessel disorder (vascular disease). You have medical conditions that increase your risk for  infection or you have had a prior ICD infection. You do not need a pacemaker. You are younger in age and are expected to have a long-term need for defibrillation. Talk with your health care provider about the benefits of an S-ICD. Tell a health care provider about: Any allergies you have. All medicines you are taking, including vitamins, herbs, eye drops, creams, and over-the-counter medicines. Any problems you or family members have had with anesthetic medicines. Any blood disorders you have. Any surgeries you have had. Any medical conditions you have. Whether you are pregnant or may be pregnant. What are the risks? Generally, this is a safe procedure. However, problems may occur, including: Infection. Bleeding. Allergic reactions to medicines or dyes. Failure to shock or correct the arrhythmia. Swelling and bruising. Blood clots. What happens before the procedure? Staying hydrated Follow instructions from your health care provider about hydration, which may include: Up to 2 hours before the procedure - you may continue to drink clear liquids, such as water, clear fruit juice, black coffee, and plain tea.  Eating and drinking restrictions Follow instructions from your health care provider about eating and drinking, which may include: 8 hours before the procedure - stop eating heavy meals or foods, such as meat, fried foods, or fatty foods. 6 hours before the procedure - stop eating light meals or foods, such as toast or cereal. 6 hours before the procedure - stop drinking milk or drinks that contain milk. 2 hours before the procedure - stop drinking clear liquids. Medicines Ask your health care provider about: Changing or stopping your regular medicines. This is especially important if you are taking diabetes medicines or blood thinners. Taking medicines such as aspirin and ibuprofen. These medicines can thin your blood. Do not take these medicines unless your health care provider  tells you to take them. Taking over-the-counter medicines, vitamins, herbs, and supplements. Tests You may have tests, such as: Blood tests. A test to check the electrical signals in your heart (electrocardiogram, ECG). Imaging tests, such as a chest X-ray. General instructions Do not use any products that contain nicotine or tobacco for at least 4 weeks before the procedure. These products include cigarettes, chewing tobacco, and vaping devices, such as e-cigarettes. If you need help quitting, ask your health care provider. Ask your health care provider: How your procedure site will be marked. What steps will be taken to help prevent infection. These may include: Removing hair at the surgery site. Washing skin with a germ-killing soap. Taking antibiotic medicine. Plan to have a responsible adult care for you for the time you are told after you leave the hospital or clinic. This is important. What happens during the procedure? Small monitors will be put on your body. They will be used to check your heart, blood pressure, and oxygen level. An IV will be inserted into one of your veins. You will be given one or more of the following: A medicine to help you relax (sedative). A medicine to numb the area (local anesthetic). A medicine to make you fall asleep (general anesthetic). A small incision will be made on the left side of your chest near your rib cage, under your arm. A pocket (pouch) will be made in the skin on the left lower part of your chest. The S-ICD  pulse generator will be inserted into this pocket. An electrode will be placed under the skin in your chest, near your breastbone (sternum). The electrode will be attached to the S-ICD pulse generator. The S-ICD will be tested, and your health care provider will program the S-ICD for the condition being treated. Your incision will be closed with stitches (sutures) or glue and adhesive strips. A bandage (dressing) may be placed over  your incision. The procedure may vary among health care providers and hospitals. What happens after the procedure? Your blood pressure, heart rate, breathing rate, and blood oxygen level will be monitored until you leave the hospital or clinic. You will be given pain medicine as needed. You may have a chest X-ray to check the S-ICD. You will get an identification card explaining that you have an S-ICD. You will need to keep this card with you at all times. You will be given a remote home monitoring device to use with your S-ICD to allow your device to communicate with your clinic. Do not drive until your health care provider approves. Summary A subcutaneous cardioverter defibrillator implantation is a type of surgery to implant a device that corrects dangerous irregular heart rhythms (arrhythmias). During the surgery, a subcutaneous implantable cardioverter defibrillator (S-ICD) is placed under the skin in the chest. The S-ICD electrode rests in the chest, but the electrode does not directly attach to the heart or blood vessels. This information is not intended to replace advice given to you by your health care provider. Make sure you discuss any questions you have with your health care provider. Document Revised: 10/17/2019 Document Reviewed: 10/17/2019 Elsevier Patient Education  [place].

## 2021-10-09 ENCOUNTER — Ambulatory Visit: Payer: Medicaid Other | Admitting: Internal Medicine

## 2021-10-09 ENCOUNTER — Ambulatory Visit: Payer: Medicaid Other | Admitting: Cardiology

## 2021-10-09 NOTE — Progress Notes (Deleted)
Established Patient Office Visit  Subjective:  Patient ID: DAHIANA Howard, female    DOB: 05-Sep-1985  Age: 36 y.o. MRN: [id number]  CC:  No chief complaint on file.   HPI Molly Howard presents for follow up.  Diabetes, Type uncertain -First diagnosed when she was a kid and told she was a type 1, but then told she was a type 2 later in life so uncertain  -Last T9Q 3/00 10.5 -Medications: Fargixa 10, Lantus 6 units BID -Patient is compliant with the above medications and reports no side effects.  -Checking BG at home: fasting 250-300, evening 100, had one low sugar in the 80's in the afternoon  -Diet: recently cut out fried foods, tries to eat fruit for snacks   -Exercise: tries to walk but gets short of breath with the heart failure  -Eye exam: April 22 scheduled  -Foot exam: UTD, saw Podiatrist and had nails cut  -Microalbumin: elevated, on Entresto and Farxiga  -Statin: yes -PNA vaccine: Prevnar 23 in 7/22 -Denies symptoms of hypoglycemia, polyuria, polydipsia, numbness extremities, foot ulcers/trauma.   Hypertension/HFrEF/NICM: -Medications: Torsemide 30 once daily, Carvedilol 6.25, Spirnolactone 25, Entresto and KCl 10  -Patient is compliant with above medications and reports no side effects. -Denies any SOB, CP, vision changes, LE edema or symptoms of hypotension -Follows with Cardiology, saw them 10/07/21 to discuss ICD implant but hgb was found to be 8.1 -Last echo 5/23 showing worsening EF at 25%  HLD: -Medications: Lipitor 20 for one year -Patient is compliant with above medications and reports no side effects.  -Last lipid panel: Lipid Panel     Component Value Date/Time   CHOL 286 (H) 06/12/2021 1354   TRIG 242 (H) 06/12/2021 1354   HDL 74 06/12/2021 1354   CHOLHDL 3.9 06/12/2021 1354   VLDL 24 05/21/2020 0643   LDLCALC 171 (H) 06/12/2021 1354   GERD: -Not currently on daily medications, taking Pepcid as needed   CKD: -Last creatinine 2/23 1.00, GFR >60    Anemia: -Last hgb 10.3 in 2/23, has menorrhagia, referred to Gyn who did Pap and vaginal [place], awaiting follow up appointment to discuss results   Past Medical History:  Diagnosis Date   Acid reflux    CHF (congestive heart failure) ([place])    Chronic HFrEF (heart failure with reduced ejection fraction) ([place])    a. 10/2019 Echo: EF 35-40%, GrII DD; b. 05/2020 Echo: EF 20-25%, glob HK; c. 10/2020 Echo: EF 30-35%, glob HK. GrII DD, Mildly red RV fxn. Mod TR.   CKD (chronic kidney disease), stage II    Diabetes mellitus ([place])    H/O medication noncompliance    Hypertension    Microcytic anemia    NICM (nonischemic cardiomyopathy) ([place])    a. 10/2019 Echo: EF 35-40%; b. 10/2019 MV: No ischemia. Small apical defect-->breast attenuation; c. 05/2020 Echo: EF 20-25%; d. 10/2020 Echo: EF 30-35%, glob HK. GrII DD, Mildly red RV fxn. Mod TR.   Obesity    Polysubstance abuse ([place])     Past Surgical History:  Procedure Laterality Date   CHOLECYSTECTOMY     HERNIA REPAIR      Family History  Problem Relation Age of Onset   Heart failure Mother        a. onset in her 67s   Diabetes Mother    Hypertension Father    Diabetes Father     Social History   Socioeconomic History   Marital status: Single    Spouse  name: Not on file   Number of children: Not on file   Years of education: Not on file   Highest education level: Not on file  Occupational History   Not on file  Tobacco Use   Smoking status: Former    Packs/day: 0.50    Types: Cigarettes    Quit date: 2022    Years since quitting: 1.4   Smokeless tobacco: Never  Vaping Use   Vaping Use: Never used  Substance and Sexual Activity   Alcohol use: Yes    Comment: ~ 4 shots/day on weekends only   Drug use: Not Currently    Types: Cocaine    Comment: Last day of use was prior to June 2021 hosp.visit.    Sexual activity: Not Currently    Birth control/protection: None  Other Topics Concern   Not on file  Social History Narrative    Not on file   Social Determinants of Health   Financial Resource Strain: High Risk (06/16/2020)   Overall Financial Resource Strain (CARDIA)    Difficulty of Paying Living Expenses: Hard  Food Insecurity: Food Insecurity Present (06/16/2020)   Hunger Vital Sign    Worried About Charity fundraiser in the Last Year: Often true    Ran Out of Food in the Last Year: Often true  Transportation Needs: Unmet Transportation Needs (06/16/2020)   PRAPARE - Hydrologist (Medical): Yes    Lack of Transportation (Non-Medical): Yes  Physical Activity: Not on file  Stress: Not on file  Social Connections: Not on file  Intimate Partner Violence: Not on file    Outpatient Medications Prior to Visit  Medication Sig Dispense Refill   acetaminophen (TYLENOL) 500 MG tablet Take 1,000 mg by mouth every 6 (six) hours as needed (for pain.).     atorvastatin (LIPITOR) 40 MG tablet Take 1 tablet (40 mg total) by mouth daily. (Patient taking differently: Take 20 mg by mouth daily.) 90 tablet 3   blood glucose meter kit and supplies KIT Dispense based on patient and insurance preference. Use up to four times daily as directed. (FOR ICD-9 250.00, 250.01). 1 each 0   carvedilol (COREG) 6.25 MG tablet Take 1 tablet (6.25 mg total) by mouth 2 (two) times daily with a meal. 180 tablet 3   dapagliflozin propanediol (FARXIGA) 10 MG TABS tablet Take 1 tablet (10 mg total) by mouth once daily. 90 tablet 3   famotidine (PEPCID) 20 MG tablet Take 1 tablet (20 mg total) by mouth 2 (two) times daily. 90 tablet 3   insulin glargine (LANTUS SOLOSTAR) 100 UNIT/ML Solostar Pen Inject 6 Units into the skin 2 (two) times daily. 15 mL PRN   Insulin Pen Needle (PEN NEEDLES 3/16") 31G X 5 MM MISC 1 each by Does not apply route 2 (two) times daily. 100 each 3   meloxicam (MOBIC) 15 MG tablet Take 1 tablet (15 mg total) by mouth daily. 30 tablet 0   norethindrone (AYGESTIN) 5 MG tablet Take 1 tablet (5 mg  total) by mouth daily. 90 tablet 0   potassium chloride SA (KLOR-CON M) 20 MEQ tablet Take 0.5 tablets (10 mEq total) by mouth daily. 45 tablet 3   sacubitril-valsartan (ENTRESTO) 97-103 MG Take 1 tablet by mouth 2 (two) times daily. 180 tablet 3   spironolactone (ALDACTONE) 25 MG tablet Take 1 tablet (25 mg total) by mouth once daily. 90 tablet 3   torsemide (DEMADEX) 20 MG tablet Take  40 mg by mouth daily.     No facility-administered medications prior to visit.    Allergies  Allergen Reactions   No [facility] Food Allergies Rash and Other (See Comments)    Lemons    ROS Review of Systems  Constitutional:  Negative for chills and fever.  Respiratory:  Negative for cough and shortness of breath.   Cardiovascular:  Negative for chest pain.  Gastrointestinal:  Negative for diarrhea, nausea and vomiting.  Endocrine: Negative for polyuria.  Skin: Negative.   Neurological:  Negative for dizziness and light-headedness.      Objective:    Physical Exam Constitutional:      Appearance: Normal appearance.  HENT:     Head: Normocephalic and atraumatic.  Eyes:     Conjunctiva/sclera: Conjunctivae normal.  Cardiovascular:     Rate and Rhythm: Normal rate and regular rhythm.  Pulmonary:     Effort: Pulmonary effort is normal.     Breath sounds: Normal breath sounds.  Musculoskeletal:     Right lower leg: No edema.     Left lower leg: No edema.  Skin:    General: Skin is warm and dry.  Neurological:     General: No focal deficit present.     Mental Status: She is alert. Mental status is at baseline.  Psychiatric:        Mood and Affect: Mood normal.        Behavior: Behavior normal.     There were no vitals taken for this visit. Wt Readings from Last 3 Encounters:  10/07/21 213 lb (96.6 kg)  09/14/21 203 lb (92.1 kg)  08/24/21 203 lb (92.1 kg)     Health Maintenance Due  Topic Date Due   COVID-19 Vaccine (1) Never done   OPHTHALMOLOGY EXAM  Never done    There  are no preventive care reminders to display for this patient.  Lab Results  Component Value Date   TSH 3.286 10/02/2019   Lab Results  Component Value Date   WBC 6.7 10/07/2021   HGB 8.1 (L) 10/07/2021   HCT 27.1 (L) 10/07/2021   MCV 81.6 10/07/2021   PLT 476 (H) 10/07/2021   Lab Results  Component Value Date   NA 136 10/07/2021   K 4.3 10/07/2021   CO2 24 10/07/2021   GLUCOSE 260 (H) 10/07/2021   BUN 25 (H) 10/07/2021   CREATININE 1.20 (H) 10/07/2021   BILITOT 0.6 07/21/2021   ALKPHOS 90 07/21/2021   AST 21 07/21/2021   ALT 13 07/21/2021   PROT 6.5 07/21/2021   ALBUMIN 2.1 (L) 07/21/2021   CALCIUM 8.1 (L) 10/07/2021   ANIONGAP 8 10/07/2021   EGFR 52 (L) 06/12/2021   Lab Results  Component Value Date   CHOL 286 (H) 06/12/2021   Lab Results  Component Value Date   HDL 74 06/12/2021   Lab Results  Component Value Date   LDLCALC 171 (H) 06/12/2021   Lab Results  Component Value Date   TRIG 242 (H) 06/12/2021   Lab Results  Component Value Date   CHOLHDL 3.9 06/12/2021   Lab Results  Component Value Date   HGBA1C 10.5 (H) 06/12/2021      Assessment & Plan:   Problem List Items Addressed This Visit   None No orders of the defined types were placed in this encounter.   Follow-up: No follow-ups on file.    [name], DO

## 2021-10-12 ENCOUNTER — Encounter (HOSPITAL_COMMUNITY): Admission: RE | Payer: Self-pay | Source: Home / Self Care

## 2021-10-12 ENCOUNTER — Encounter: Payer: Self-pay | Admitting: Cardiology

## 2021-10-12 ENCOUNTER — Telehealth (HOSPITAL_COMMUNITY): Payer: Self-pay

## 2021-10-12 ENCOUNTER — Ambulatory Visit (HOSPITAL_COMMUNITY): Admission: RE | Admit: 2021-10-12 | Payer: Medicaid Other | Source: Home / Self Care | Admitting: Cardiology

## 2021-10-12 SURGERY — SUBQ ICD IMPLANT
Anesthesia: General

## 2021-10-12 NOTE — Progress Notes (Deleted)
Patient ID: Molly Howard, female    DOB: 1986-03-19, 36 y.o.   MRN: [id number]  HPI  [name] is a 36 y/o female with a history of DM, HTN, previous drug use and chronic heart failure.   Echo report from 09/18/21 reviewed and showed an EF of 25-30%. Echo report from 06/30/21 reviewed and showed an EF of 30-35% along with mild MR. Echo report from 03/20/21 reviewed and showed an EF of 25-30% along with severely elevated PA pressure of 70.8 mmHg, mild LAE, mild MR and mild/moderate TR. Echo report from 10/11/20 reviewed and showed an EF of 30-35% along with moderate TR.   Cardiac MRI 05/19/21 showed an EF of 42%  Was in the ED 08/08/21 due to left shoulder impingement where she was evaluated and released. Was in the ED 07/21/21 due to increased swelling of her hands arms and face.  Also endorsing increased dyspnea and intermittent chest pain and 15 pound weight gain. Was in the ED 06/15/21 due to hyperglycemia where she was treated and released.    She presents today for a follow-up visit with a chief complaint of   Says that she's drinking "too much fluids" and is aware of it. Loves to eat ice and understands how to measure ice into fluids.   Past Medical History:  Diagnosis Date   Acid reflux    CHF (congestive heart failure) ([facility])    Chronic HFrEF (heart failure with reduced ejection fraction) ([place])    a. 10/2019 Echo: EF 35-40%, GrII DD; b. 05/2020 Echo: EF 20-25%, glob HK; c. 10/2020 Echo: EF 30-35%, glob HK. GrII DD, Mildly red RV fxn. Mod TR.   CKD (chronic kidney disease), stage II    Diabetes mellitus ([place])    H/O medication noncompliance    Hypertension    Microcytic anemia    NICM (nonischemic cardiomyopathy) ([place])    a. 10/2019 Echo: EF 35-40%; b. 10/2019 MV: No ischemia. Small apical defect-->breast attenuation; c. 05/2020 Echo: EF 20-25%; d. 10/2020 Echo: EF 30-35%, glob HK. GrII DD, Mildly red RV fxn. Mod TR.   Obesity    Polysubstance abuse ([place])    Past Surgical History:  Procedure  Laterality Date   CHOLECYSTECTOMY     HERNIA REPAIR     Family History  Problem Relation Age of Onset   Heart failure Mother        a. onset in her 104s   Diabetes Mother    Hypertension Father    Diabetes Father    Social History   Tobacco Use   Smoking status: Former    Packs/day: 0.50    Types: Cigarettes    Quit date: 2022    Years since quitting: 1.4   Smokeless tobacco: Never  Substance Use Topics   Alcohol use: Yes    Comment: ~ 4 shots/day on weekends only   Allergies  Allergen Reactions   No [facility] Food Allergies Rash and Other (See Comments)    Lemons     Review of Systems  Constitutional:  Positive for fatigue. Negative for appetite change.  HENT:  Negative for congestion, postnasal drip and sore throat.   Eyes: Negative.   Respiratory:  Positive for cough and shortness of breath.   Cardiovascular:  Positive for palpitations and leg swelling (left side). Negative for chest pain.  Gastrointestinal:  Negative for abdominal distention and abdominal pain.  Endocrine: Negative.   Genitourinary: Negative.   Musculoskeletal:  Positive for back pain. Negative for arthralgias.  Skin: Negative.   Allergic/Immunologic: Negative.   Neurological:  Positive for light-headedness. Negative for dizziness.  Hematological:  Negative for adenopathy. Does not bruise/bleed easily.  Psychiatric/Behavioral:  Positive for sleep disturbance (sleeping on 2 pillows). Negative for dysphoric mood. The patient is not nervous/anxious.       Physical Exam Vitals and nursing note reviewed.  Constitutional:      Appearance: Normal appearance.  HENT:     Head: Normocephalic and atraumatic.  Cardiovascular:     Rate and Rhythm: Normal rate and regular rhythm.  Pulmonary:     Effort: Pulmonary effort is normal. No respiratory distress.     Breath sounds: No wheezing or rales.  Abdominal:     General: There is no distension.     Tenderness: There is no abdominal tenderness.   Musculoskeletal:        General: No tenderness.     Cervical back: Normal range of motion and neck supple.     Right lower leg: Edema (2+ pitting) present.     Left lower leg: Edema (2+ pitting) present.  Skin:    General: Skin is warm and dry.  Neurological:     General: No focal deficit present.     Mental Status: She is alert and oriented to person, place, and time.  Psychiatric:        Mood and Affect: Mood normal.        Behavior: Behavior normal.        Thought Content: Thought content normal.     Assessment & Plan:  1: Acute on Chronic heart failure with reduced ejection fraction- - NYHA class III - fluid overloaded today with 4 pound weight gain, worsening edema and worsening symptoms; worsening symptoms started after she ran out of farxiga (this has been resumed 4 days ago) - not weighing daily but does have working scales; emphasized that she weigh daily and reminded to call for an overnight weight gain of > 2 pounds or a weekly weight gain of > 5 pounds - weight 203 pounds from last visit here 6 weeks ago - saw cardiology Kathlen Mody) 09/14/21 - saw EP Quentin Ore) 10/07/21; was to have ICD implanted 10/12/21 but it was cancelled due to anemia - on GDMT of carvedilol, entresto, farxiga and spironolactone  - again, emphasized the importance of keeping daily fluid intake to 60-64 ounces/ daily which would include the ice she's eating - has been using seasoning salt and discussed not using that for anything; can use garlic/onion powder, Molly Howard, pepper or other seasonings that don't contain salt; explained that it can take awhile for the tastebuds to get used to the food - participating in paramedicine program & paramedic was present during clinic visit today - BNP 09/14/21 was 632.7  2: HTN- - BP  - saw PCP Rosana Berger) 07/16/21 - BMP 10/07/21 reviewed and showed sodium 136, potassium 4.3, creatinine 1.2 & GFR 60  3: DM- - A1c 06/12/21 was 10.5% - fasting glucose at home today was    4: Anemia- - CBC 10/07/21 reviewed and showed hemoglobin 8.1   Patient did not bring her medications nor a list. Each medication was verbally reviewed with the patient and she was encouraged to bring the bottles to every visit to confirm accuracy of list.

## 2021-10-13 ENCOUNTER — Ambulatory Visit: Payer: Medicaid Other | Admitting: Family

## 2021-10-13 NOTE — Telephone Encounter (Signed)
Contacted [name] to see if she remembers her HF clinic for tomorrow.  She advised she forgot and she was out of town.  She states will not be back until 31st.  Rescheduled her HF clinic appt and advised why.  She states doing well and has all her medications with her.  Advised her of new appt.  Will continue to visit for heart failure, diet and medication management.   [name] [phone]

## 2021-10-21 ENCOUNTER — Telehealth: Payer: Self-pay

## 2021-10-21 ENCOUNTER — Ambulatory Visit: Payer: Self-pay | Admitting: *Deleted

## 2021-10-21 NOTE — Telephone Encounter (Signed)
   Chief Complaint: anemia Symptoms: light headed, passed out Frequency: frequently Pertinent Negatives: Patient denies fever Disposition: [x] ED /[x] Urgent Care (no appt availability in office) / [x] Appointment(In office/virtual)/ []  [facility]/ [] Home Care/ [] Refused Recommended Disposition /[] [facility]/ []  Follow-up with PCP Additional Notes: Pt stated she had a medical procedure canceled because her hgb has dropped to 8.1. She is light headed when stands, passed out once. She is still taking bp med as well as fluid pill because legs are swollen. Advised to go to ED/UC, pt refused, she is out of town for almost 3 more weeks. Appt made  for first day in town. Pt advised if not going to ED/UC she needs to get a bp machine, or go to fire department or drug store to have bp checked. Her hgb has dropped and she is still taking fluid pills and bp meds. Advised if passes out again to call EMS.  Reason for Disposition  [1] MODERATE leg swelling (e.g., swelling extends up to knees) AND [2] new-onset or worsening  Answer Assessment - Initial Assessment Questions 1. ONSET: "When did the swelling start?" (e.g., minutes, hours, days)     Legs swollen for a month, increased fluid pill 2. LOCATION: "What part of the leg is swollen?"  "Are both legs swollen or just one leg?"     Both but right is larger 3. SEVERITY: "How bad is the swelling?" (e.g., localized; mild, moderate, severe)  - Localized - small area of swelling localized to one leg  - MILD pedal edema - swelling limited to foot and ankle, pitting edema < 1/4 inch (6 mm) deep, rest and elevation eliminate most or all swelling  - MODERATE edema - swelling of lower leg to knee, pitting edema > 1/4 inch (6 mm) deep, rest and elevation only partially reduce swelling  - SEVERE edema - swelling extends above knee, facial or hand swelling present      Pitting edema from knees down 4. REDNESS: "Does the swelling look red or  infected?"     no 5. PAIN: "Is the swelling painful to touch?" If Yes, ask: "How painful is it?"   (Scale 1-10; mild, moderate or severe)     some 6. FEVER: "Do you have a fever?" If Yes, ask: "What is it, how was it measured, and when did it start?"      no 7. CAUSE: "What do you think is causing the leg swelling?"     Fluid problems 8. MEDICAL HISTORY: "Do you have a history of heart failure, kidney disease, liver failure, or cancer?"     CHF 9. RECURRENT SYMPTOM: "Have you had leg swelling before?" If Yes, ask: "When was the last time?" "What happened that time?"     Were swollen before 10. OTHER SYMPTOMS: "Do you have any other symptoms?" (e.g., chest pain, difficulty breathing)       At times 11. PREGNANCY: "Is there any chance you are pregnant?" "When was your last menstrual period?"       no  Protocols used: Leg Swelling and Edema-A-AH

## 2021-10-21 NOTE — Telephone Encounter (Signed)
This Clinical research associate contacted patient to verify cancellation of device check appointment tomorrow (as patient is still on our schedule) and I can see implant with Dr. Lalla Brothers on 10/12/21 was cancelled.   When speaking with patient she confirms that appt was cancelled because her Hgb had dropped to 8.1 on 10/07/21.  Patient states she was told to contact her PCP and make them aware in order to have corrected before following back up with device implant.   Patient and I discussed this in detail.  She is out of town and has not made any effort to contact primary and states she was hoping to wait until later next month to get in touch with them.  I explained in detail the seriousness of a dropping Hbg of 8.1, especially if it continues to decrease as well as she has had syncopal event the day before her blood work on 6/7 and has been reporting increasing symptoms with her heart failure at appt with [facility] in clinic in May. She currently does not report any new syncopal episodes and is not actively SOB or dyspneic on the phone. I informed patient that she needs to call primary immediately and follow up with them regarding next steps treatment for her severe anemia and educated her on the severe risks she is taking in delaying attention to this level.    Patient verbalizes understanding and states that she will call us back after following with primary to get back on our schedule for device placement.   I will copy this to heart failure clinic and Tina/Dr. Lambert's nurse as an Lorain Childes.

## 2021-10-22 ENCOUNTER — Ambulatory Visit: Payer: Medicaid Other

## 2021-10-27 ENCOUNTER — Ambulatory Visit: Payer: Medicaid Other | Admitting: Family

## 2021-10-28 ENCOUNTER — Other Ambulatory Visit: Payer: Self-pay

## 2021-11-05 ENCOUNTER — Encounter: Payer: Self-pay | Admitting: Family

## 2021-11-05 ENCOUNTER — Ambulatory Visit: Payer: Medicaid Other | Attending: Family | Admitting: Family

## 2021-11-05 VITALS — BP 142/95 | HR 98 | Resp 16 | Ht 67.0 in | Wt 221.4 lb

## 2021-11-05 DIAGNOSIS — E1165 Type 2 diabetes mellitus with hyperglycemia: Secondary | ICD-10-CM | POA: Diagnosis not present

## 2021-11-05 DIAGNOSIS — I5020 Unspecified systolic (congestive) heart failure: Secondary | ICD-10-CM | POA: Diagnosis not present

## 2021-11-05 DIAGNOSIS — R002 Palpitations: Secondary | ICD-10-CM | POA: Insufficient documentation

## 2021-11-05 DIAGNOSIS — Z7901 Long term (current) use of anticoagulants: Secondary | ICD-10-CM | POA: Insufficient documentation

## 2021-11-05 DIAGNOSIS — R0602 Shortness of breath: Secondary | ICD-10-CM | POA: Insufficient documentation

## 2021-11-05 DIAGNOSIS — Z7984 Long term (current) use of oral hypoglycemic drugs: Secondary | ICD-10-CM | POA: Diagnosis not present

## 2021-11-05 DIAGNOSIS — Z79899 Other long term (current) drug therapy: Secondary | ICD-10-CM | POA: Diagnosis not present

## 2021-11-05 DIAGNOSIS — R0789 Other chest pain: Secondary | ICD-10-CM | POA: Insufficient documentation

## 2021-11-05 DIAGNOSIS — R140 Abdominal distension (gaseous): Secondary | ICD-10-CM | POA: Insufficient documentation

## 2021-11-05 DIAGNOSIS — D631 Anemia in chronic kidney disease: Secondary | ICD-10-CM | POA: Diagnosis not present

## 2021-11-05 DIAGNOSIS — I130 Hypertensive heart and chronic kidney disease with heart failure and stage 1 through stage 4 chronic kidney disease, or unspecified chronic kidney disease: Secondary | ICD-10-CM | POA: Diagnosis not present

## 2021-11-05 DIAGNOSIS — D509 Iron deficiency anemia, unspecified: Secondary | ICD-10-CM

## 2021-11-05 DIAGNOSIS — Z794 Long term (current) use of insulin: Secondary | ICD-10-CM | POA: Diagnosis not present

## 2021-11-05 DIAGNOSIS — E1122 Type 2 diabetes mellitus with diabetic chronic kidney disease: Secondary | ICD-10-CM | POA: Insufficient documentation

## 2021-11-05 DIAGNOSIS — N182 Chronic kidney disease, stage 2 (mild): Secondary | ICD-10-CM | POA: Insufficient documentation

## 2021-11-05 DIAGNOSIS — I5022 Chronic systolic (congestive) heart failure: Secondary | ICD-10-CM | POA: Insufficient documentation

## 2021-11-05 DIAGNOSIS — I10 Essential (primary) hypertension: Secondary | ICD-10-CM | POA: Diagnosis not present

## 2021-11-05 DIAGNOSIS — R059 Cough, unspecified: Secondary | ICD-10-CM | POA: Insufficient documentation

## 2021-11-05 MED ORDER — DAPAGLIFLOZIN PROPANEDIOL 10 MG PO TABS: 10.0000 mg | ORAL_TABLET | Freq: Every day | ORAL | 3 refills | Status: DC

## 2021-11-05 NOTE — Progress Notes (Signed)
Patient ID: Molly Howard, female    DOB: Feb 16, 1986, 36 y.o.   MRN: [id number]  HPI  Molly Howard is a 36 y/o female with a history of DM, HTN, previous drug use and chronic heart failure.   Echo report from 09/18/21 reviewed and showed an EF of 25-30%. Echo report from 06/30/21 reviewed and showed an EF of 30-35% along with mild MR. Echo report from 03/20/21 reviewed and showed an EF of 25-30% along with severely elevated PA pressure of 70.8 mmHg, mild LAE, mild MR and mild/moderate TR. Echo report from 10/11/20 reviewed and showed an EF of 30-35% along with moderate TR.   Cardiac MRI 05/19/21 showed an EF of 42%  Was in the ED 10/21/21 due to acute right knee pain where Molly Howard was evaluated and released. Was in the ED 08/08/21 due to left shoulder impingement where Molly Howard was evaluated and released. Was in the ED 07/21/21 due to increased swelling of her hands arms and face.  Also endorsing increased dyspnea and intermittent chest pain and 15 pound weight gain. Was in the ED 06/15/21 due to hyperglycemia where Molly Howard was treated and released.    Molly Howard presents today for a follow-up visit with a chief complaint of moderate fatigue with minimal exertion. Describes this as chronic in nature. Molly Howard has associated cough, shortness of breath, intermittent chest pain, pedal edema, palpitations, abdominal distention, difficulty sleeping and gradual weight gain along with this. Molly Howard denies dizziness or abdominal pain.   Says that Molly Howard's drinking "too much fluids" and is aware of it. Loves to eat ice and understands how to measure ice into fluids. Not adding any salt but has eaten out at fast food places recently. Molly Howard has been to [place] and [facility]. Took her last dose of farxiga yesterday.   Past Medical History:  Diagnosis Date   Acid reflux    CHF (congestive heart failure) (HCC)    Chronic HFrEF (heart failure with reduced ejection fraction) ([place])    a. 10/2019 Echo: EF 35-40%, GrII DD; b. 05/2020 Echo: EF 20-25%, glob HK; c.  10/2020 Echo: EF 30-35%, glob HK. GrII DD, Mildly red RV fxn. Mod TR.   CKD (chronic kidney disease), stage II    Diabetes mellitus ([place])    H/O medication noncompliance    Hypertension    Microcytic anemia    NICM (nonischemic cardiomyopathy) ([place])    a. 10/2019 Echo: EF 35-40%; b. 10/2019 MV: No ischemia. Small apical defect-->breast attenuation; c. 05/2020 Echo: EF 20-25%; d. 10/2020 Echo: EF 30-35%, glob HK. GrII DD, Mildly red RV fxn. Mod TR.   Obesity    Polysubstance abuse ([place])    Past Surgical History:  Procedure Laterality Date   CHOLECYSTECTOMY     HERNIA REPAIR     Family History  Problem Relation Age of Onset   Heart failure Mother        a. onset in her 54s   Diabetes Mother    Hypertension Father    Diabetes Father    Social History   Tobacco Use   Smoking status: Former    Packs/day: 0.50    Types: Cigarettes    Quit date: 2022    Years since quitting: 1.5   Smokeless tobacco: Never  Substance Use Topics   Alcohol use: Yes    Comment: ~ 4 shots/day on weekends only   Allergies  Allergen Reactions   No [employer] Food Allergies Rash and Other (See Comments)    Casimira Sutphin Griffiths  Prior to Admission medications   Medication Sig Start Date End Date Taking? Authorizing Provider  acetaminophen (TYLENOL) 500 MG tablet Take 1,000 mg by mouth every 6 (six) hours as needed (for pain.).   Yes [provider]  atorvastatin (LIPITOR) 40 MG tablet Take 1 tablet (40 mg total) by mouth daily. Patient taking differently: Take 20 mg by mouth daily. 07/16/21  Yes [name], DO  blood glucose meter kit and supplies KIT Dispense based on patient and insurance preference. Use up to four times daily as directed. (FOR ICD-9 250.00, 250.01). 12/08/20  Yes Max Sane, MD  carvedilol (COREG) 6.25 MG tablet Take 1 tablet (6.25 mg total) by mouth 2 (two) times daily with a meal. 12/11/20  Yes Darylene Price A, FNP  famotidine (PEPCID) 20 MG tablet Take 1 tablet (20 mg total) by  mouth 2 (two) times daily. 06/12/21  Yes [name], DO  insulin glargine (LANTUS SOLOSTAR) 100 UNIT/ML Solostar Pen Inject 6 Units into the skin 2 (two) times daily. 07/16/21  Yes [name], DO  Insulin Pen Needle (PEN NEEDLES 3/16") 31G X 5 MM MISC 1 each by Does not apply route 2 (two) times daily. 07/16/21  Yes [name], DO  meloxicam (MOBIC) 15 MG tablet Take 1 tablet (15 mg total) by mouth daily. 08/08/21 08/08/22 Yes Cuthriell, Charline Bills, PA-C  norethindrone (AYGESTIN) 5 MG tablet Take 1 tablet (5 mg total) by mouth daily. 6/75/44  Yes Copland, Alicia B, PA-C  potassium chloride SA (KLOR-CON M) 20 MEQ tablet Take 0.5 tablets (10 mEq total) by mouth daily. 04/29/21  Yes Shaylinn Hladik A, FNP  sacubitril-valsartan (ENTRESTO) 97-103 MG Take 1 tablet by mouth 2 (two) times daily. 07/02/21  Yes [name], MD  spironolactone (ALDACTONE) 25 MG tablet Take 1 tablet (25 mg total) by mouth once daily. 12/11/20  Yes Tongela Encinas, Otila Kluver A, FNP  torsemide (DEMADEX) 20 MG tablet Take 60 mg by mouth daily. 03/31/21  Yes [provider]  dapagliflozin propanediol (FARXIGA) 10 MG TABS tablet Take 1 tablet (10 mg total) by mouth once daily. 11/05/21   Darylene Price A, FNP  Potassium Chloride 40 MEQ/15ML (20%) SOLN Take 40 mEq by mouth 2 (two) times daily. 07/04/20 10/18/20  [name], MD   Review of Systems  Constitutional:  Positive for fatigue. Negative for appetite change.  HENT:  Negative for congestion, postnasal drip and sore throat.   Eyes: Negative.   Respiratory:  Positive for cough and shortness of breath.   Cardiovascular:  Positive for chest pain (at times), palpitations and leg swelling (left side).  Gastrointestinal:  Positive for abdominal distention. Negative for abdominal pain.  Endocrine: Negative.   Genitourinary: Negative.   Musculoskeletal:  Positive for arthralgias (right knee) and back pain.  Skin: Negative.   Allergic/Immunologic: Negative.    Neurological:  Negative for dizziness and light-headedness.  Hematological:  Negative for adenopathy. Does not bruise/bleed easily.  Psychiatric/Behavioral:  Positive for sleep disturbance (sleeping on 2 pillows). Negative for dysphoric mood. The patient is not nervous/anxious.    Vitals:   11/05/21 1209  BP: (!) 142/95  Pulse: 98  Resp: 16  SpO2: 98%  Weight: 221 lb 6 oz (100.4 kg)  Height: '5\' 7"'  (1.702 m)   Wt Readings from Last 3 Encounters:  11/05/21 221 lb 6 oz (100.4 kg)  10/07/21 213 lb (96.6 kg)  09/14/21 203 lb (92.1 kg)   Lab Results  Component Value Date   CREATININE 1.20 (H) 10/07/2021  CREATININE 1.05 (H) 09/14/2021   CREATININE 0.95 08/24/2021   Physical Exam Vitals and nursing note reviewed.  Constitutional:      Appearance: Normal appearance.  HENT:     Head: Normocephalic and atraumatic.  Cardiovascular:     Rate and Rhythm: Normal rate and regular rhythm.  Pulmonary:     Effort: Pulmonary effort is normal. No respiratory distress.     Breath sounds: No wheezing or rales.  Abdominal:     General: There is no distension.     Palpations: Abdomen is soft.     Tenderness: There is no abdominal tenderness.  Musculoskeletal:        General: No tenderness.     Cervical back: Normal range of motion and neck supple.     Right lower leg: Edema (1+ pitting) present.     Left lower leg: Edema (1+ pitting) present.  Skin:    General: Skin is warm and dry.  Neurological:     General: No focal deficit present.     Mental Status: Molly Howard is alert and oriented to person, place, and time.  Psychiatric:        Mood and Affect: Mood normal.        Behavior: Behavior normal.        Thought Content: Thought content normal.     Assessment & Plan:  1: Chronic heart failure with reduced ejection fraction- - NYHA class III - euvolemic today - weighing daily; reminded to call for an overnight weight gain of > 2 pounds or a weekly weight gain of > 5 pounds - weight up  18 pounds from last visit here 2.5 months ago - saw cardiology Kathlen Mody) 09/14/21 - saw EP Quentin Ore) 10/07/21; was to have ICD implanted 10/12/21 but it was cancelled due to anemia - on GDMT of carvedilol, entresto, farxiga and spironolactone  - again, emphasized the importance of keeping daily fluid intake to 60-64 ounces/ daily which would include the ice Molly Howard's eating; estimating that Molly Howard's drinking at least 80 ounces daily currently - has been using seasoning salt and discussed not using that for anything; can use garlic/onion powder, Mrs Deliah Boston, pepper or other seasonings that don't contain salt - has been out to eat at [place] [facility] recently and says that Molly Howard normally doesn't eat out much - farxiga patient assistance forms filled out today and 2 weeks samples provided - participating in paramedicine program  - to get compression socks and put them on every morning with removal at bedtime; elevate legs when sitting for long periods of time - BNP 09/14/21 was 632.7  2: HTN- - BP elevated (142/95) - saw PCP Rosana Berger) 07/16/21; returns next week - BMP 10/07/21 reviewed and showed sodium 136, potassium 4.3, creatinine 1.2 & GFR 60  3: DM- - A1c 06/12/21 was 10.5% - fasting glucose at home today was 200  4: Anemia- - CBC 10/07/21 reviewed and showed hemoglobin 8.1   Patient did not bring her medications nor a list. Each medication was verbally reviewed with the patient and Molly Howard was encouraged to bring the bottles to every visit to confirm accuracy of list.   Return in 1 month, sooner if needed.

## 2021-11-05 NOTE — Patient Instructions (Addendum)
Continue weighing daily and call for an overnight weight gain of 3 pounds or more or a weekly weight gain of more than 5 pounds.  If you have voicemail, please make sure your mailbox is cleaned out so that we may leave a message and please make sure to listen to any voicemails.   Drink between 60-64 ounces of fluid a day  See about getting compression socks

## 2021-11-09 ENCOUNTER — Encounter: Payer: Self-pay | Admitting: Internal Medicine

## 2021-11-09 ENCOUNTER — Other Ambulatory Visit
Admission: RE | Admit: 2021-11-09 | Discharge: 2021-11-09 | Disposition: A | Discharge status: Home / Self Care | Payer: Medicaid Other | Source: Ambulatory Visit | Attending: Internal Medicine | Admitting: Internal Medicine

## 2021-11-09 ENCOUNTER — Ambulatory Visit (INDEPENDENT_AMBULATORY_CARE_PROVIDER_SITE_OTHER): Payer: Medicaid Other | Admitting: Internal Medicine

## 2021-11-09 VITALS — BP 124/80 | HR 108 | Temp 98.1°F | Resp 16 | Ht 67.0 in | Wt 229.8 lb

## 2021-11-09 DIAGNOSIS — I5020 Unspecified systolic (congestive) heart failure: Secondary | ICD-10-CM | POA: Diagnosis not present

## 2021-11-09 DIAGNOSIS — D649 Anemia, unspecified: Secondary | ICD-10-CM

## 2021-11-09 DIAGNOSIS — E1129 Type 2 diabetes mellitus with other diabetic kidney complication: Secondary | ICD-10-CM | POA: Insufficient documentation

## 2021-11-09 LAB — COMPREHENSIVE METABOLIC PANEL
ALT: 16 U/L (ref 0–44)
AST: 18 U/L (ref 15–41)
Albumin: 2 g/dL — ABNORMAL LOW (ref 3.5–5.0)
Alkaline Phosphatase: 105 U/L (ref 38–126)
Anion gap: 9 (ref 5–15)
BUN: 24 mg/dL — ABNORMAL HIGH (ref 6–20)
CO2: 24 mmol/L (ref 22–32)
Calcium: 7.9 mg/dL — ABNORMAL LOW (ref 8.9–10.3)
Chloride: 101 mmol/L (ref 98–111)
Creatinine, Ser: 1.17 mg/dL — ABNORMAL HIGH (ref 0.44–1.00)
GFR, Estimated: >60 mL/min (ref >60)
Glucose, Bld: 171 mg/dL — ABNORMAL HIGH (ref 70–99)
Potassium: 4.5 mmol/L (ref 3.5–5.1)
Sodium: 134 mmol/L — ABNORMAL LOW (ref 135–145)
Total Bilirubin: 0.7 mg/dL (ref 0.3–1.2)
Total Protein: 5.8 g/dL — ABNORMAL LOW (ref 6.5–8.1)

## 2021-11-09 LAB — CBC WITH DIFFERENTIAL/PLATELET
Abs Immature Granulocytes: 0.02 10*3/uL (ref 0.00–0.07)
Basophils Absolute: 0.1 10*3/uL (ref 0.0–0.1)
Basophils Relative: 1 %
Eosinophils Absolute: 0.2 10*3/uL (ref 0.0–0.5)
Eosinophils Relative: 3 %
HCT: 25.4 % — ABNORMAL LOW (ref 36.0–46.0)
Hemoglobin: 7.3 g/dL — ABNORMAL LOW (ref 12.0–15.0)
Immature Granulocytes: 0 %
Lymphocytes Relative: 22 %
Lymphs Abs: 1.6 10*3/uL (ref 0.7–4.0)
MCH: 21.5 pg — ABNORMAL LOW (ref 26.0–34.0)
MCHC: 28.7 g/dL — ABNORMAL LOW (ref 30.0–36.0)
MCV: 74.7 fL — ABNORMAL LOW (ref 80.0–100.0)
Monocytes Absolute: 0.5 10*3/uL (ref 0.1–1.0)
Monocytes Relative: 7 %
Neutro Abs: 4.8 10*3/uL (ref 1.7–7.7)
Neutrophils Relative %: 67 %
Platelets: 449 10*3/uL — ABNORMAL HIGH (ref 150–400)
RBC: 3.4 MIL/uL — ABNORMAL LOW (ref 3.87–5.11)
RDW: 18.7 % — ABNORMAL HIGH (ref 11.5–15.5)
WBC: 7.2 10*3/uL (ref 4.0–10.5)
nRBC: 0 % (ref 0.0–0.2)

## 2021-11-09 LAB — IRON AND TIBC
Iron: 20 ug/dL — ABNORMAL LOW (ref 28–170)
Saturation Ratios: 6 % — ABNORMAL LOW (ref 10.4–31.8)
TIBC: 333 ug/dL (ref 250–450)
UIBC: 313 ug/dL

## 2021-11-09 LAB — HEMOGLOBIN A1C
Hgb A1c MFr Bld: 10.5 % — ABNORMAL HIGH (ref 4.8–5.6)
Mean Plasma Glucose: 254.65 mg/dL

## 2021-11-09 LAB — TSH: TSH: 5.114 u[IU]/mL — ABNORMAL HIGH (ref 0.350–4.500)

## 2021-11-09 LAB — FERRITIN: Ferritin: 4 ng/mL — ABNORMAL LOW (ref 11–307)

## 2021-11-09 NOTE — Progress Notes (Signed)
Established Patient Office Visit  Subjective:  Patient ID: Molly Howard, female    DOB: Sep 04, 1985  Age: 36 y.o. MRN: [id number]  CC:  Chief Complaint  Patient presents with   Anemia    HPI Molly Howard presents for follow up.  Diabetes, Type uncertain -First diagnosed when she was a kid and told she was a type 1, but then told she was a type 2 later in life so uncertain  -Last I0X 7/35 10.5 -Medications: Fargixa 10, Lantus 6 units BID -Patient is compliant with the above medications and reports no side effects.  -Checking BG at home: fasting 250-300, evening 100, had one low sugar in the 80's in the afternoon  -Diet: recently cut out fried foods, tries to eat fruit for snacks but has been drinking [place] Orange Juice  -Exercise: tries to walk but gets short of breath with the heart failure  -Eye exam: April 22 scheduled but did not go - due  -Foot exam: UTD, saw Podiatrist and had nails cut  -Microalbumin: elevated, on Entresto and Farxiga  -Statin: yes -PNA vaccine: Prevnar 23 in 7/22 -Denies symptoms of hypoglycemia, polyuria, polydipsia, numbness extremities, foot ulcers/trauma.   Hypertension/HFrEF/NICM: -Medications: Torsemide 30 once daily, Carvedilol 6.25, Spirnolactone 25, Entresto and KCl 10 -Patient is compliant with above medications and reports no side effects. -Denies any SOB, CP, vision changes, LE edema or symptoms of hypotension -Follows with Cardiology note reviewed from 10/07/21 -Planning for subcutaneous ICD but hemoglobin low  -Last echo 09/18/21 showing reduced EF at 25-30% with global hypokinesis of the left ventricle   HLD: -Medications: Lipitor 20 for one year -Patient is compliant with above medications and reports no side effects.  -Last lipid panel: Lipid Panel     Component Value Date/Time   CHOL 286 (H) 06/12/2021 1354   TRIG 242 (H) 06/12/2021 1354   HDL 74 06/12/2021 1354   CHOLHDL 3.9 06/12/2021 1354   VLDL 24 05/21/2020 0643    LDLCALC 171 (H) 06/12/2021 1354   GERD: -Not currently on daily medications, taking Pepcid as needed   CKD: -Last creatinine 6/23 1.20, GFR 60    Anemia: -Hgb 10.3 in 2/23, has menorrhagia, referred to Gyn who did Pap and vaginal [place]. US showing endometrial polyp and she was given an OCP in March but she has not started it or picked it up from the pharmacy yet.  - last hgb 10/07/21 8.1. -Having a period once a month - will bleed for 7 days, light for 3 days then heavy the last 4 days. Has to change pads/tampons every 1 hour on heavy days. Does have clots.    Past Medical History:  Diagnosis Date   Acid reflux    CHF (congestive heart failure) (HCC)    Chronic HFrEF (heart failure with reduced ejection fraction) ([place])    a. 10/2019 Echo: EF 35-40%, GrII DD; b. 05/2020 Echo: EF 20-25%, glob HK; c. 10/2020 Echo: EF 30-35%, glob HK. GrII DD, Mildly red RV fxn. Mod TR.   CKD (chronic kidney disease), stage II    Diabetes mellitus ([place])    H/O medication noncompliance    Hypertension    Microcytic anemia    NICM (nonischemic cardiomyopathy) ([place])    a. 10/2019 Echo: EF 35-40%; b. 10/2019 MV: No ischemia. Small apical defect-->breast attenuation; c. 05/2020 Echo: EF 20-25%; d. 10/2020 Echo: EF 30-35%, glob HK. GrII DD, Mildly red RV fxn. Mod TR.   Obesity    Polysubstance abuse (  [place])     Past Surgical History:  Procedure Laterality Date   CHOLECYSTECTOMY     HERNIA REPAIR      Family History  Problem Relation Age of Onset   Heart failure Mother        a. onset in her 92s   Diabetes Mother    Hypertension Father    Diabetes Father     Social History   Socioeconomic History   Marital status: Single    Spouse name: Not on file   Number of children: Not on file   Years of education: Not on file   Highest education level: Not on file  Occupational History   Not on file  Tobacco Use   Smoking status: Former    Packs/day: 0.50    Types: Cigarettes    Quit date: 2022    Years since  quitting: 1.5   Smokeless tobacco: Never  Vaping Use   Vaping Use: Never used  Substance and Sexual Activity   Alcohol use: Yes    Comment: ~ 4 shots/day on weekends only   Drug use: Not Currently    Types: Cocaine    Comment: Last day of use was prior to June 2021 hosp.visit.    Sexual activity: Not Currently    Birth control/protection: None  Other Topics Concern   Not on file  Social History Narrative   Not on file   Social Determinants of Health   Financial Resource Strain: High Risk (06/16/2020)   Overall Financial Resource Strain (CARDIA)    Difficulty of Paying Living Expenses: Hard  Food Insecurity: Food Insecurity Present (06/16/2020)   Hunger Vital Sign    Worried About Charity fundraiser in the Last Year: Often true    Ran Out of Food in the Last Year: Often true  Transportation Needs: Unmet Transportation Needs (06/16/2020)   PRAPARE - Hydrologist (Medical): Yes    Lack of Transportation (Non-Medical): Yes  Physical Activity: Not on file  Stress: Not on file  Social Connections: Not on file  Intimate Partner Violence: Not on file    Outpatient Medications Prior to Visit  Medication Sig Dispense Refill   acetaminophen (TYLENOL) 500 MG tablet Take 1,000 mg by mouth every 6 (six) hours as needed (for pain.).     atorvastatin (LIPITOR) 40 MG tablet Take 1 tablet (40 mg total) by mouth daily. (Patient taking differently: Take 20 mg by mouth daily.) 90 tablet 3   blood glucose meter kit and supplies KIT Dispense based on patient and insurance preference. Use up to four times daily as directed. (FOR ICD-9 250.00, 250.01). 1 each 0   carvedilol (COREG) 6.25 MG tablet Take 1 tablet (6.25 mg total) by mouth 2 (two) times daily with a meal. 180 tablet 3   dapagliflozin propanediol (FARXIGA) 10 MG TABS tablet Take 1 tablet (10 mg total) by mouth once daily. 90 tablet 3   famotidine (PEPCID) 20 MG tablet Take 1 tablet (20 mg total) by mouth 2 (two)  times daily. 90 tablet 3   insulin glargine (LANTUS SOLOSTAR) 100 UNIT/ML Solostar Pen Inject 6 Units into the skin 2 (two) times daily. 15 mL PRN   Insulin Pen Needle (PEN NEEDLES 3/16") 31G X 5 MM MISC 1 each by Does not apply route 2 (two) times daily. 100 each 3   meloxicam (MOBIC) 15 MG tablet Take 1 tablet (15 mg total) by mouth daily. 30 tablet 0   norethindrone (  AYGESTIN) 5 MG tablet Take 1 tablet (5 mg total) by mouth daily. 90 tablet 0   potassium chloride SA (KLOR-CON M) 20 MEQ tablet Take 0.5 tablets (10 mEq total) by mouth daily. 45 tablet 3   sacubitril-valsartan (ENTRESTO) 97-103 MG Take 1 tablet by mouth 2 (two) times daily. 180 tablet 3   spironolactone (ALDACTONE) 25 MG tablet Take 1 tablet (25 mg total) by mouth once daily. 90 tablet 3   torsemide (DEMADEX) 20 MG tablet Take 60 mg by mouth daily.     No facility-administered medications prior to visit.    Allergies  Allergen Reactions   No [employer] Food Allergies Rash and Other (See Comments)    Lemons    ROS Review of Systems  Constitutional:  Negative for chills and fever.  Respiratory:  Negative for cough.   Cardiovascular:  Negative for chest pain.  Gastrointestinal:  Negative for diarrhea, nausea and vomiting.  Genitourinary:  Positive for menstrual problem.  Skin: Negative.   Neurological:  Positive for light-headedness.      Objective:    Physical Exam Constitutional:      Appearance: Normal appearance.  HENT:     Head: Normocephalic and atraumatic.  Eyes:     Conjunctiva/sclera: Conjunctivae normal.  Cardiovascular:     Rate and Rhythm: Normal rate and regular rhythm.  Pulmonary:     Effort: Pulmonary effort is normal.     Breath sounds: Normal breath sounds.  Musculoskeletal:     Right lower leg: No edema.     Left lower leg: No edema.  Skin:    General: Skin is warm and dry.  Neurological:     General: No focal deficit present.     Mental Status: She is alert. Mental status is at  baseline.  Psychiatric:        Mood and Affect: Mood normal.        Behavior: Behavior normal.     BP 124/80   Pulse (!) 108   Temp 98.1 F (36.7 C)   Resp 16   Ht _0  (1.702 m)   Wt 229 lb 12.8 oz (104.2 kg)   SpO2 99%   BMI 35.99 kg/m  Wt Readings from Last 3 Encounters:  11/05/21 221 lb 6 oz (100.4 kg)  10/07/21 213 lb (96.6 kg)  09/14/21 203 lb (92.1 kg)     Health Maintenance Due  Topic Date Due   COVID-19 Vaccine (1) Never done   OPHTHALMOLOGY EXAM  Never done    There are no preventive care reminders to display for this patient.  Lab Results  Component Value Date   TSH 3.286 10/02/2019   Lab Results  Component Value Date   WBC 6.7 10/07/2021   HGB 8.1 (L) 10/07/2021   HCT 27.1 (L) 10/07/2021   MCV 81.6 10/07/2021   PLT 476 (H) 10/07/2021   Lab Results  Component Value Date   NA 136 10/07/2021   K 4.3 10/07/2021   CO2 24 10/07/2021   GLUCOSE 260 (H) 10/07/2021   BUN 25 (H) 10/07/2021   CREATININE 1.20 (H) 10/07/2021   BILITOT 0.6 07/21/2021   ALKPHOS 90 07/21/2021   AST 21 07/21/2021   ALT 13 07/21/2021   PROT 6.5 07/21/2021   ALBUMIN 2.1 (L) 07/21/2021   CALCIUM 8.1 (L) 10/07/2021   ANIONGAP 8 10/07/2021   EGFR 52 (L) 06/12/2021   Lab Results  Component Value Date   CHOL 286 (H) 06/12/2021   Lab Results  Component  Value Date   HDL 74 06/12/2021   Lab Results  Component Value Date   LDLCALC 171 (H) 06/12/2021   Lab Results  Component Value Date   TRIG 242 (H) 06/12/2021   Lab Results  Component Value Date   CHOLHDL 3.9 06/12/2021   Lab Results  Component Value Date   HGBA1C 10.5 (H) 06/12/2021      Assessment & Plan:   1. 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 In the meantime continue Lantus 6 units BID although based on her sugars her A1c will most likely be the same and her insulin will be increased.   - C-peptide;  Future - HgB A1c; Future  2. HFrEF (heart failure with reduced ejection fraction) ([place]): Following with Cardiology, note from 10/07/21 reviewed. Continue Torsemide 30 once daily, Carvedilol 6.25, Spirnolactone 25, Entresto and KCl 10. Cannot have ICD placed because of low hbg.   3. Chronic anemia: Stat labs to recheck hemoglobin will most likely require blood transfusion. Check iron panel as well, had been on oral iron previously but not currently. Check TSH, CMP as well. Anemia most likely iron deficiency secondary to menorrhagia - needs to pick up birth control and start right away to help with blood loss. No other blood loss, no blood in stools or family history of colon cancer.   - CBC w/Diff/Platelet; Future - Fe+TIBC+Fer; Future - TSH; Future - Comprehensive Metabolic Panel (CMET); Future  Follow-up: Return in about 4 weeks (around 12/07/2021).    [name], DO

## 2021-11-09 NOTE — Patient Instructions (Addendum)
It was great seeing you today!  Plan discussed at today's visit: -Blood work ordered today, results will be uploaded to MyChart.  -For now, continue Lantus 6 units twice daily, will call you to change dose based on labs -Continue to check sugars daily -Please go pick up birth control and start taking immediately   Follow up in: 1 month  Take care and let us know if you have any questions or concerns prior to your next visit.  Dr. Caralee Ates

## 2021-11-10 ENCOUNTER — Telehealth: Payer: Self-pay | Admitting: Internal Medicine

## 2021-11-10 LAB — C-PEPTIDE: C-Peptide: 6.2 ng/mL — ABNORMAL HIGH (ref 1.1–4.4)

## 2021-11-10 NOTE — Telephone Encounter (Signed)
We can not send orders out of state she must go through ER

## 2021-11-10 NOTE — Telephone Encounter (Signed)
ER may call us for details

## 2021-11-10 NOTE — Telephone Encounter (Signed)
Copied from CRM [id number]. Topic: General - Inquiry >> Nov 10, 2021  2:35 PM [name] wrote: Reason for CRM: Pt called to let Dr. Caralee Ates know that she will go to [facility] / she is out of town there/ and asked if Dr. Caralee Ates can send orders for blood transfusion there / she will be going there tomorrow morning/ please call pt and advise asap

## 2021-11-11 ENCOUNTER — Telehealth (HOSPITAL_COMMUNITY): Payer: Self-pay

## 2021-11-11 NOTE — Telephone Encounter (Signed)
Contacted Molly Howard and she states she went t ED to get some blood yesterday.  Her doctor called and advised her her blood count was low.  She has appt with them again next month.  Her hemoglobin keeps getting lower.  She states feeling ok, she is taking all her medications.  She states her swelling has been good.  She states eating healthier when she is out of town because her friend cooks healthier.  She is baking the food instead of frying. Asked if she would be back by 21st to speak to a social worker to help with any needs she has but she will not be back til after.  Will set her up to see her next month.  She is still waiting to hear from disability. Advised her to let me know when she is back in town and will make a home visit.  She is aware to call with any problems or if running out of meds.  Will continue to visit for heart falure, diet and medication compliance.   Earmon Phoenix  EMT-Paramedic [phone]

## 2021-12-07 ENCOUNTER — Ambulatory Visit: Payer: Medicaid Other | Admitting: Internal Medicine

## 2021-12-07 NOTE — Progress Notes (Deleted)
Established Patient Office Visit  Subjective:  Patient ID: Molly Howard, female    DOB: 09-04-1985  Age: 36 y.o. MRN: [id number]  CC:  No chief complaint on file.   HPI Molly Howard presents for follow up.  Diabetes: -First diagnosed when she was a kid and told she was a type 1, but then told she was a type 2 later in life so uncertain  -Last N8G 9/56 10.5 -Medications: Fargixa 10, Lantus 6 units BID -Patient is compliant with the above medications and reports no side effects.  -Checking BG at home: fasting 250-300, evening 100, had one low sugar in the 80's in the afternoon  -Diet: recently cut out fried foods, tries to eat fruit for snacks but has been drinking Min[place]id Orange Juice  -Exercise: tries to walk but gets short of breath with the heart failure  -Eye exam: April 22 scheduled but did not go - due  -Foot exam: UTD, saw Podiatrist and had nails cut  -Microalbumin: elevated, on Entresto and Farxiga  -Statin: yes -PNA vaccine: Prevnar 23 in 7/22 -Denies symptoms of hypoglycemia, polyuria, polydipsia, numbness extremities, foot ulcers/trauma.   Hypertension/HFrEF/NICM: -Medications: Torsemide 30 once daily, Carvedilol 6.25, Spirnolactone 25, Entresto and KCl 10 -Patient is compliant with above medications and reports no side effects. -Denies any SOB, CP, vision changes, LE edema or symptoms of hypotension -Follows with Cardiology note reviewed from 10/07/21 -Planning for subcutaneous ICD but hemoglobin low  -Last echo 09/18/21 showing reduced EF at 25-30% with global hypokinesis of the left ventricle   HLD: -Medications: Lipitor 20 for one year -Patient is compliant with above medications and reports no side effects.  -Last lipid panel: Lipid Panel     Component Value Date/Time   CHOL 286 (H) 06/12/2021 1354   TRIG 242 (H) 06/12/2021 1354   HDL 74 06/12/2021 1354   CHOLHDL 3.9 06/12/2021 1354   VLDL 24 05/21/2020 0643   LDLCALC 171 (H) 06/12/2021 1354    GERD: -Not currently on daily medications, taking Pepcid as needed   CKD: -Last creatinine 6/23 1.20, GFR 60    Anemia: -Hgb 10.3 in 2/23, has menorrhagia, referred to Gyn who did Pap and vaginal [place]. US showing endometrial polyp and she was given an OCP in March but she has not started it or picked it up from the pharmacy yet.  - last hgb 10/07/21 8.1. -Having a period once a month - will bleed for 7 days, light for 3 days then heavy the last 4 days. Has to change pads/tampons every 1 hour on heavy days. Does have clots.    Past Medical History:  Diagnosis Date   Acid reflux    CHF (congestive heart failure) (HCC)    Chronic HFrEF (heart failure with reduced ejection fraction) ([place])    a. 10/2019 Echo: EF 35-40%, GrII DD; b. 05/2020 Echo: EF 20-25%, glob HK; c. 10/2020 Echo: EF 30-35%, glob HK. GrII DD, Mildly red RV fxn. Mod TR.   CKD (chronic kidney disease), stage II    Diabetes mellitus ([place])    H/O medication noncompliance    Hypertension    Microcytic anemia    NICM (nonischemic cardiomyopathy) ([place])    a. 10/2019 Echo: EF 35-40%; b. 10/2019 MV: No ischemia. Small apical defect-->breast attenuation; c. 05/2020 Echo: EF 20-25%; d. 10/2020 Echo: EF 30-35%, glob HK. GrII DD, Mildly red RV fxn. Mod TR.   Obesity    Polysubstance abuse [facility])     Past Surgical  History:  Procedure Laterality Date   CHOLECYSTECTOMY     HERNIA REPAIR      Family History  Problem Relation Age of Onset   Heart failure Mother        a. onset in her 43s   Diabetes Mother    Hypertension Father    Diabetes Father     Social History   Socioeconomic History   Marital status: Single    Spouse name: Not on file   Number of children: Not on file   Years of education: Not on file   Highest education level: Not on file  Occupational History   Not on file  Tobacco Use   Smoking status: Former    Packs/day: 0.50    Types: Cigarettes    Quit date: 2022    Years since quitting: 1.5   Smokeless  tobacco: Never  Vaping Use   Vaping Use: Never used  Substance and Sexual Activity   Alcohol use: Yes    Comment: ~ 4 shots/day on weekends only   Drug use: Not Currently    Types: Cocaine    Comment: Last day of use was prior to June 2021 hosp.visit.    Sexual activity: Not Currently    Birth control/protection: None  Other Topics Concern   Not on file  Social History Narrative   Not on file   Social Determinants of Health   Financial Resource Strain: High Risk (06/16/2020)   Overall Financial Resource Strain (CARDIA)    Difficulty of Paying Living Expenses: Hard  Food Insecurity: Food Insecurity Present (06/16/2020)   Hunger Vital Sign    Worried About Charity fundraiser in the Last Year: Often true    Ran Out of Food in the Last Year: Often true  Transportation Needs: Unmet Transportation Needs (06/16/2020)   PRAPARE - Hydrologist (Medical): Yes    Lack of Transportation (Non-Medical): Yes  Physical Activity: Not on file  Stress: Not on file  Social Connections: Not on file  Intimate Partner Violence: Not on file    Outpatient Medications Prior to Visit  Medication Sig Dispense Refill   acetaminophen (TYLENOL) 500 MG tablet Take 1,000 mg by mouth every 6 (six) hours as needed (for pain.).     atorvastatin (LIPITOR) 40 MG tablet Take 1 tablet (40 mg total) by mouth daily. (Patient taking differently: Take 20 mg by mouth daily.) 90 tablet 3   blood glucose meter kit and supplies KIT Dispense based on patient and insurance preference. Use up to four times daily as directed. (FOR ICD-9 250.00, 250.01). 1 each 0   carvedilol (COREG) 6.25 MG tablet Take 1 tablet (6.25 mg total) by mouth 2 (two) times daily with a meal. 180 tablet 3   dapagliflozin propanediol (FARXIGA) 10 MG TABS tablet Take 1 tablet (10 mg total) by mouth once daily. 90 tablet 3   famotidine (PEPCID) 20 MG tablet Take 1 tablet (20 mg total) by mouth 2 (two) times daily. 90 tablet 3    insulin glargine (LANTUS SOLOSTAR) 100 UNIT/ML Solostar Pen Inject 6 Units into the skin 2 (two) times daily. 15 mL PRN   Insulin Pen Needle (PEN NEEDLES 3/16") 31G X 5 MM MISC 1 each by Does not apply route 2 (two) times daily. 100 each 3   meloxicam (MOBIC) 15 MG tablet Take 1 tablet (15 mg total) by mouth daily. 30 tablet 0   norethindrone (AYGESTIN) 5 MG tablet Take 1 tablet (  5 mg total) by mouth daily. 90 tablet 0   potassium chloride SA (KLOR-CON M) 20 MEQ tablet Take 0.5 tablets (10 mEq total) by mouth daily. 45 tablet 3   sacubitril-valsartan (ENTRESTO) 97-103 MG Take 1 tablet by mouth 2 (two) times daily. 180 tablet 3   spironolactone (ALDACTONE) 25 MG tablet Take 1 tablet (25 mg total) by mouth once daily. 90 tablet 3   torsemide (DEMADEX) 20 MG tablet Take 60 mg by mouth daily.     No facility-administered medications prior to visit.    Allergies  Allergen Reactions   No [employer] Food Allergies Rash and Other (See Comments)    Lemons    ROS Review of Systems  Constitutional:  Negative for chills and fever.  Respiratory:  Negative for cough.   Cardiovascular:  Negative for chest pain.  Gastrointestinal:  Negative for diarrhea, nausea and vomiting.  Genitourinary:  Positive for menstrual problem.  Skin: Negative.   Neurological:  Positive for light-headedness.      Objective:    Physical Exam Constitutional:      Appearance: Normal appearance.  HENT:     Head: Normocephalic and atraumatic.  Eyes:     Conjunctiva/sclera: Conjunctivae normal.  Cardiovascular:     Rate and Rhythm: Normal rate and regular rhythm.  Pulmonary:     Effort: Pulmonary effort is normal.     Breath sounds: Normal breath sounds.  Musculoskeletal:     Right lower leg: No edema.     Left lower leg: No edema.  Skin:    General: Skin is warm and dry.  Neurological:     General: No focal deficit present.     Mental Status: She is alert. Mental status is at baseline.  Psychiatric:         Mood and Affect: Mood normal.        Behavior: Behavior normal.     There were no vitals taken for this visit. Wt Readings from Last 3 Encounters:  11/09/21 229 lb 12.8 oz (104.2 kg)  11/05/21 221 lb 6 oz (100.4 kg)  10/07/21 213 lb (96.6 kg)     Health Maintenance Due  Topic Date Due   COVID-19 Vaccine (1) Never done   OPHTHALMOLOGY EXAM  Never done   INFLUENZA VACCINE  12/01/2021    There are no preventive care reminders to display for this patient.  Lab Results  Component Value Date   TSH 5.114 (H) 11/09/2021   Lab Results  Component Value Date   WBC 7.2 11/09/2021   HGB 7.3 (L) 11/09/2021   HCT 25.4 (L) 11/09/2021   MCV 74.7 (L) 11/09/2021   PLT 449 (H) 11/09/2021   Lab Results  Component Value Date   NA 134 (L) 11/09/2021   K 4.5 11/09/2021   CO2 24 11/09/2021   GLUCOSE 171 (H) 11/09/2021   BUN 24 (H) 11/09/2021   CREATININE 1.17 (H) 11/09/2021   BILITOT 0.7 11/09/2021   ALKPHOS 105 11/09/2021   AST 18 11/09/2021   ALT 16 11/09/2021   PROT 5.8 (L) 11/09/2021   ALBUMIN 2.0 (L) 11/09/2021   CALCIUM 7.9 (L) 11/09/2021   ANIONGAP 9 11/09/2021   EGFR 52 (L) 06/12/2021   Lab Results  Component Value Date   CHOL 286 (H) 06/12/2021   Lab Results  Component Value Date   HDL 74 06/12/2021   Lab Results  Component Value Date   LDLCALC 171 (H) 06/12/2021   Lab Results  Component Value Date  TRIG 242 (H) 06/12/2021   Lab Results  Component Value Date   CHOLHDL 3.9 06/12/2021   Lab Results  Component Value Date   HGBA1C 10.5 (H) 11/09/2021      Assessment & Plan:   1. 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 In the meantime continue Lantus 6 units BID although based on her sugars her A1c will most likely be the same and her insulin will be increased.   - C-peptide; Future - HgB A1c; Future  2. HFrEF (heart failure with reduced ejection  fraction) ([place]): Following with Cardiology, note from 10/07/21 reviewed. Continue Torsemide 30 once daily, Carvedilol 6.25, Spirnolactone 25, Entresto and KCl 10. Cannot have ICD placed because of low hbg.   3. Chronic anemia: Stat labs to recheck hemoglobin will most likely require blood transfusion. Check iron panel as well, had been on oral iron previously but not currently. Check TSH, CMP as well. Anemia most likely iron deficiency secondary to menorrhagia - needs to pick up birth control and start right away to help with blood loss. No other blood loss, no blood in stools or family history of colon cancer.   - CBC w/Diff/Platelet; Future - Fe+TIBC+Fer; Future - TSH; Future - Comprehensive Metabolic Panel (CMET); Future  Follow-up: No follow-ups on file.    [name], DO

## 2021-12-14 ENCOUNTER — Other Ambulatory Visit (HOSPITAL_COMMUNITY): Payer: Self-pay

## 2021-12-14 ENCOUNTER — Other Ambulatory Visit: Payer: Self-pay | Admitting: Family

## 2021-12-14 MED ORDER — SPIRONOLACTONE 25 MG PO TABS: 25.0000 mg | ORAL_TABLET | Freq: Every day | ORAL | 3 refills | Status: DC

## 2021-12-14 NOTE — Progress Notes (Signed)
Spironolactone, atorvastatin, famitidine

## 2021-12-15 ENCOUNTER — Telehealth (HOSPITAL_COMMUNITY): Payer: Self-pay

## 2021-12-16 ENCOUNTER — Ambulatory Visit: Payer: Medicaid Other | Attending: Family | Admitting: Family

## 2021-12-16 ENCOUNTER — Encounter: Payer: Self-pay | Admitting: Family

## 2021-12-16 ENCOUNTER — Encounter (HOSPITAL_COMMUNITY): Payer: Self-pay

## 2021-12-16 ENCOUNTER — Telehealth: Payer: Self-pay | Admitting: Family

## 2021-12-16 VITALS — BP 132/98 | HR 107 | Resp 18 | Ht 67.0 in | Wt 233.5 lb

## 2021-12-16 DIAGNOSIS — N182 Chronic kidney disease, stage 2 (mild): Secondary | ICD-10-CM | POA: Insufficient documentation

## 2021-12-16 DIAGNOSIS — Z79899 Other long term (current) drug therapy: Secondary | ICD-10-CM | POA: Insufficient documentation

## 2021-12-16 DIAGNOSIS — Z7984 Long term (current) use of oral hypoglycemic drugs: Secondary | ICD-10-CM | POA: Diagnosis not present

## 2021-12-16 DIAGNOSIS — I130 Hypertensive heart and chronic kidney disease with heart failure and stage 1 through stage 4 chronic kidney disease, or unspecified chronic kidney disease: Secondary | ICD-10-CM | POA: Diagnosis not present

## 2021-12-16 DIAGNOSIS — Z8669 Personal history of other diseases of the nervous system and sense organs: Secondary | ICD-10-CM

## 2021-12-16 DIAGNOSIS — I10 Essential (primary) hypertension: Secondary | ICD-10-CM | POA: Diagnosis not present

## 2021-12-16 DIAGNOSIS — E1122 Type 2 diabetes mellitus with diabetic chronic kidney disease: Secondary | ICD-10-CM | POA: Insufficient documentation

## 2021-12-16 DIAGNOSIS — I5022 Chronic systolic (congestive) heart failure: Secondary | ICD-10-CM | POA: Diagnosis present

## 2021-12-16 DIAGNOSIS — I5020 Unspecified systolic (congestive) heart failure: Secondary | ICD-10-CM

## 2021-12-16 DIAGNOSIS — E1165 Type 2 diabetes mellitus with hyperglycemia: Secondary | ICD-10-CM

## 2021-12-16 DIAGNOSIS — D509 Iron deficiency anemia, unspecified: Secondary | ICD-10-CM

## 2021-12-16 DIAGNOSIS — Z833 Family history of diabetes mellitus: Secondary | ICD-10-CM | POA: Diagnosis not present

## 2021-12-16 DIAGNOSIS — D649 Anemia, unspecified: Secondary | ICD-10-CM | POA: Diagnosis not present

## 2021-12-16 DIAGNOSIS — Z794 Long term (current) use of insulin: Secondary | ICD-10-CM | POA: Diagnosis not present

## 2021-12-16 DIAGNOSIS — Z8249 Family history of ischemic heart disease and other diseases of the circulatory system: Secondary | ICD-10-CM | POA: Insufficient documentation

## 2021-12-16 DIAGNOSIS — R296 Repeated falls: Secondary | ICD-10-CM | POA: Diagnosis not present

## 2021-12-16 NOTE — Progress Notes (Signed)
  Signed   Scheduled transportation for Molly Howard for Friday to see her PCP.  Ref # [id number].    Earmon Phoenix [place] EMT-Paramedic [phone]         Last Modified by Earmon Phoenix, EMT on 12/16/2021 at  1:23 PM

## 2021-12-16 NOTE — Patient Instructions (Addendum)
Continue weighing daily and call for an overnight weight gain of 3 pounds or more or a weekly weight gain of more than 5 pounds.   If you have voicemail, please make sure your mailbox is cleaned out so that we may leave a message and please make sure to listen to any voicemails.    Get medications picked up and resume taking them today

## 2021-12-16 NOTE — Telephone Encounter (Signed)
Patient has an appointment today with our office and after speaking to [place] with the paramedicine program she stated patient needs help for the second time getting her medications. I advised Silva Bandy that I can give her another giftcard or two to help with medications today but that we can no longer do that after today.   Nakeshia Waldeck, NT

## 2021-12-16 NOTE — Telephone Encounter (Signed)
Contacted [place] to see if she picked her meds up.  She states she had not.  Explained importance of taking her meds.  She has HF clinic appt tomorrow, will advised them of meds she is out of and if we can assist with a card to help pay for her meds.  Cost should be $12.   Earmon Phoenix [place] EMT-Paramedic [phone]

## 2021-12-16 NOTE — Patient Instructions (Signed)
Scheduled transportation for Molly Howard for Friday to see her PCP.  Ref # [id number].   Earmon Phoenix [place] EMT-Paramedic [phone]

## 2021-12-16 NOTE — Progress Notes (Signed)
Patient ID: Molly Howard, female    DOB: 1986-03-19, 36 y.o.   MRN: [id number]  HPI  Molly Howard is a 36 y/o female with a history of DM, HTN, previous drug use and chronic heart failure.   Echo report from 09/18/21 reviewed and showed an EF of 25-30%. Echo report from 06/30/21 reviewed and showed an EF of 30-35% along with mild MR. Echo report from 03/20/21 reviewed and showed an EF of 25-30% along with severely elevated PA pressure of 70.8 mmHg, mild LAE, mild MR and mild/moderate TR. Echo report from 10/11/20 reviewed and showed an EF of 30-35% along with moderate TR.   Cardiac MRI 05/19/21 showed an EF of 42%  Was in the ED 11/28/21 due to HF exacerbation. Given additional diuretic with improvement of symptoms and she was released. Was in the ED 11/10/21 due to hemoglobin of 8.0. Transfusion given and she was released. Was in the ED 10/21/21 due to acute right knee pain where she was evaluated and released. Was in the ED 08/08/21 due to left shoulder impingement where she was evaluated and released. Was in the ED 07/21/21 due to increased swelling of her hands arms and face.  Also endorsing increased dyspnea and intermittent chest pain and 15 pound weight gain.   She presents today for a follow-up visit with a chief complaint of moderate fatigue with minimal exertion. Describes this as chronic in nature. She has associated cough, shortness of breath, wheezing, pedal edema, palpitations, abdominal distention, light-headedness, difficulty sleeping, chronic pain, frequent falls and weight gain along with this. Denies chest pain.   Has been out of spironolactone for ~ 2 weeks and says that she really doesn't know why.   Her biggest concern is whether she has resumed having seizures. She says that she took medication as a child for seizures but then it was stopped because she was told that she was growing out of it. She's had frequent falls recently and before she falls, she loses her hearing completely, falls  and passes out.   Was supposed to have ICD placed but has become anemic and they are wanting this to be corrected prior to insertion.   Past Medical History:  Diagnosis Date   Acid reflux    CHF (congestive heart failure) (HCC)    Chronic HFrEF (heart failure with reduced ejection fraction) ([place])    a. 10/2019 Echo: EF 35-40%, GrII DD; b. 05/2020 Echo: EF 20-25%, glob HK; c. 10/2020 Echo: EF 30-35%, glob HK. GrII DD, Mildly red RV fxn. Mod TR.   CKD (chronic kidney disease), stage II    Diabetes mellitus ([place])    H/O medication noncompliance    Hypertension    Microcytic anemia    NICM (nonischemic cardiomyopathy) ([place])    a. 10/2019 Echo: EF 35-40%; b. 10/2019 MV: No ischemia. Small apical defect-->breast attenuation; c. 05/2020 Echo: EF 20-25%; d. 10/2020 Echo: EF 30-35%, glob HK. GrII DD, Mildly red RV fxn. Mod TR.   Obesity    Polysubstance abuse ([place])    Past Surgical History:  Procedure Laterality Date   CHOLECYSTECTOMY     HERNIA REPAIR     Family History  Problem Relation Age of Onset   Heart failure Mother        a. onset in her 48s   Diabetes Mother    Hypertension Father    Diabetes Father    Social History   Tobacco Use   Smoking status: Former    Packs/day:  0.50    Types: Cigarettes    Quit date: 2022    Years since quitting: 1.6   Smokeless tobacco: Never  Substance Use Topics   Alcohol use: Yes    Comment: ~ 4 shots/day on weekends only   Allergies  Allergen Reactions   No [employer] Food Allergies Rash and Other (See Comments)    Lemons   Prior to Admission medications   Medication Sig Start Date End Date Taking? Authorizing Provider  acetaminophen (TYLENOL) 500 MG tablet Take 1,000 mg by mouth every 6 (six) hours as needed (for pain.).   Yes [provider]  blood glucose meter kit and supplies KIT Dispense based on patient and insurance preference. Use up to four times daily as directed. (FOR ICD-9 250.00, 250.01). 12/08/20  Yes Max Sane, MD   carvedilol (COREG) 6.25 MG tablet Take 1 tablet (6.25 mg total) by mouth 2 (two) times daily with a meal. 12/11/20  Yes Darylene Price A, FNP  dapagliflozin propanediol (FARXIGA) 10 MG TABS tablet Take 1 tablet (10 mg total) by mouth once daily. 11/05/21  Yes Parag Dorton, Otila Kluver A, FNP  insulin glargine (LANTUS SOLOSTAR) 100 UNIT/ML Solostar Pen Inject 6 Units into the skin 2 (two) times daily. 07/16/21  Yes [name], DO  Insulin Pen Needle (PEN NEEDLES 3/16") 31G X 5 MM MISC 1 each by Does not apply route 2 (two) times daily. 07/16/21  Yes [name], DO  norethindrone (AYGESTIN) 5 MG tablet Take 1 tablet (5 mg total) by mouth daily. 7/41/42  Yes Copland, Alicia B, PA-C  potassium chloride SA (KLOR-CON M) 20 MEQ tablet Take 0.5 tablets (10 mEq total) by mouth daily. 04/29/21  Yes Sarajane Fambrough A, FNP  sacubitril-valsartan (ENTRESTO) 97-103 MG Take 1 tablet by mouth 2 (two) times daily. 07/02/21  Yes Agbor-Etang, Aaron Edelman, MD  torsemide (DEMADEX) 20 MG tablet Take 60 mg by mouth daily. 03/31/21  Yes [provider]  atorvastatin (LIPITOR) 20 MG tablet TAKE 1 TABLET BY MOUTH DAILY Patient not taking: Reported on 12/16/2021 12/14/21   Alisa Graff, FNP  famotidine (PEPCID) 20 MG tablet Take 1 tablet (20 mg total) by mouth 2 (two) times daily. Patient not taking: Reported on 12/16/2021 06/12/21   [name], DO  meloxicam (MOBIC) 15 MG tablet Take 1 tablet (15 mg total) by mouth daily. 08/08/21 08/08/22  Cuthriell, Charline Bills, PA-C  spironolactone (ALDACTONE) 25 MG tablet TAKE 1 TABLET BY MOUTH DAILY Patient not taking: Reported on 12/16/2021 12/14/21   Alisa Graff, FNP    Review of Systems  Constitutional:  Positive for fatigue. Negative for appetite change.  HENT:  Positive for hearing loss (prior to falls/ passing out). Negative for congestion, postnasal drip and sore throat.   Eyes: Negative.   Respiratory:  Positive for cough (productive), shortness of breath and wheezing.    Cardiovascular:  Positive for palpitations and leg swelling. Negative for chest pain.  Gastrointestinal:  Positive for abdominal distention. Negative for abdominal pain.  Endocrine: Negative.   Genitourinary: Negative.   Musculoskeletal:  Positive for arthralgias (right knee) and back pain.  Skin: Negative.   Allergic/Immunologic: Negative.   Neurological:  Positive for light-headedness. Negative for dizziness.  Hematological:  Negative for adenopathy. Does not bruise/bleed easily.  Psychiatric/Behavioral:  Positive for sleep disturbance (sleeping on 2 pillows). Negative for dysphoric mood. The patient is not nervous/anxious.    Vitals:   12/16/21 1111  BP: (!) 132/98  Pulse: (!) 107  Resp: 18  SpO2:  100%  Weight: 233 lb 8 oz (105.9 kg)  Height: '5\' 7"'  (1.702 m)   Wt Readings from Last 3 Encounters:  12/16/21 233 lb 8 oz (105.9 kg)  11/09/21 229 lb 12.8 oz (104.2 kg)  11/05/21 221 lb 6 oz (100.4 kg)   Lab Results  Component Value Date   CREATININE 1.17 (H) 11/09/2021   CREATININE 1.20 (H) 10/07/2021   CREATININE 1.05 (H) 09/14/2021   Physical Exam Vitals and nursing note reviewed.  Constitutional:      Appearance: Normal appearance.  HENT:     Head: Normocephalic and atraumatic.  Cardiovascular:     Rate and Rhythm: Regular rhythm. Tachycardia present.  Pulmonary:     Effort: Pulmonary effort is normal. No respiratory distress.     Breath sounds: No wheezing or rales.  Abdominal:     General: There is no distension.     Palpations: Abdomen is soft.     Tenderness: There is no abdominal tenderness.  Musculoskeletal:        General: No tenderness.     Cervical back: Normal range of motion and neck supple.     Right lower leg: No tenderness. Edema (2+ pitting) present.     Left lower leg: No tenderness. Edema (2+ pitting) present.  Skin:    General: Skin is warm and dry.  Neurological:     General: No focal deficit present.     Mental Status: She is alert and  oriented to person, place, and time.  Psychiatric:        Mood and Affect: Mood normal.        Behavior: Behavior normal.        Thought Content: Thought content normal.     Assessment & Plan:  1: Chronic heart failure with reduced ejection fraction- - NYHA class III - euvolemic today - weighing daily; reminded to call for an overnight weight gain of > 2 pounds or a weekly weight gain of > 5 pounds - weight up 12 pounds from last visit here 6 weeks ago; has been out of spironolactone for the last 2 weeks - saw cardiology Kathlen Mody) 09/14/21; will get f/u appointment scheduled - saw EP Quentin Ore) 10/07/21; was to have ICD implanted 10/12/21 but it was cancelled due to anemia - on GDMT of carvedilol, entresto, farxiga and spironolactone (although has been out of spironolactone) - gift card provided today for her to get her medications picked up today - again, emphasized the importance of keeping daily fluid intake to 60-64 ounces/ daily which would include the ice she's eating - participating in paramedicine program  - says that she's ordered compression socks and is waiting for them to come in the mail; encouraged her to elevate her legs when sitting for long periods of time - BNP 11/28/21 was 1330.0  2: HTN- - BP mildly elevated (132/98) - saw PCP Rosana Berger) 11/09/21 - BMP 11/28/21 reviewed and showed sodium 135, potassium 3.6, creatinine 1.02 & GFR >60  3: DM- - A1c 11/09/21 was 10.5%  4: Anemia- - CBC 11/28/21 reviewed and showed hemoglobin 8.6 - received 1 unit blood last month  5: Seizures as a child- - patient says that she had seizures as a child but medication was stopped later because she was "growing out of it" - has had frequent falls and right before falling, she loses her hearing completely, falls and then passes out - appt was scheduled with PCP for 12/18/21   Patient did not bring her medications nor  a list. Each medication was verbally reviewed with the patient and she was  encouraged to bring the bottles to every visit to confirm accuracy of list.   Return in 1 month, sooner if needed.

## 2021-12-18 ENCOUNTER — Ambulatory Visit: Payer: Medicaid Other | Admitting: Internal Medicine

## 2021-12-18 NOTE — Progress Notes (Deleted)
Established Patient Office Visit  Subjective:  Patient ID: Molly Howard, female    DOB: 09/28/1985  Age: 36 y.o. MRN: [id number]  CC:  No chief complaint on file.   HPI Molly Howard presents for follow up.  Diabetes: -First diagnosed when she was a kid  -Last A1c 7/23 10.5 -Medications: Fargixa 10, Lantus 6 units BID -Patient is compliant with the above medications and reports no side effects.  -Checking BG at home: fasting 250-300, evening 100, had one low sugar in the 80's in the afternoon  -Diet: recently cut out fried foods, tries to eat fruit for snacks but has been drinking [place] Orange Juice  -Exercise: tries to walk but gets short of breath with the heart failure  -Eye exam: April 22 scheduled but did not go - due  -Foot exam: UTD, saw Podiatrist and had nails cut  -Microalbumin: elevated, on Entresto and Farxiga  -Statin: yes -PNA vaccine: Prevnar 23 in 7/22 -Denies symptoms of hypoglycemia, polyuria, polydipsia, numbness extremities, foot ulcers/trauma.   Hypertension/HFrEF/NICM: -Medications: Torsemide 30 once daily, Carvedilol 6.25, Spirnolactone 25, Entresto and KCl 10 -Patient is compliant with above medications and reports no side effects. -Denies any SOB, CP, vision changes, LE edema or symptoms of hypotension -Follows with Cardiology note reviewed from 10/07/21 -Planning for subcutaneous ICD but hemoglobin low  -Last echo 09/18/21 showing reduced EF at 25-30% with global hypokinesis of the left ventricle   HLD: -Medications: Lipitor 20 for one year -Patient is compliant with above medications and reports no side effects.  -Last lipid panel: Lipid Panel     Component Value Date/Time   CHOL 286 (H) 06/12/2021 1354   TRIG 242 (H) 06/12/2021 1354   HDL 74 06/12/2021 1354   CHOLHDL 3.9 06/12/2021 1354   VLDL 24 05/21/2020 0643   LDLCALC 171 (H) 06/12/2021 1354   GERD: -Not currently on daily medications, taking Pepcid as needed   CKD: -Last  creatinine 6/23 1.20, GFR 60    Anemia: -Hgb 10.3 in 2/23, has menorrhagia, referred to Gyn who did Pap and vaginal [place]. US showing endometrial polyp and she was given an OCP in March but she has not started it or picked it up from the pharmacy yet.  - last hgb 10/07/21 8.1. -Having a period once a month - will bleed for 7 days, light for 3 days then heavy the last 4 days. Has to change pads/tampons every 1 hour on heavy days. Does have clots.    Past Medical History:  Diagnosis Date   Acid reflux    CHF (congestive heart failure) (HCC)    Chronic HFrEF (heart failure with reduced ejection fraction) ([place])    a. 10/2019 Echo: EF 35-40%, GrII DD; b. 05/2020 Echo: EF 20-25%, glob HK; c. 10/2020 Echo: EF 30-35%, glob HK. GrII DD, Mildly red RV fxn. Mod TR.   CKD (chronic kidney disease), stage II    Diabetes mellitus ([place])    H/O medication noncompliance    Hypertension    Microcytic anemia    NICM (nonischemic cardiomyopathy) ([place])    a. 10/2019 Echo: EF 35-40%; b. 10/2019 MV: No ischemia. Small apical defect-->breast attenuation; c. 05/2020 Echo: EF 20-25%; d. 10/2020 Echo: EF 30-35%, glob HK. GrII DD, Mildly red RV fxn. Mod TR.   Obesity    Polysubstance abuse [facility])     Past Surgical History:  Procedure Laterality Date   CHOLECYSTECTOMY     HERNIA REPAIR      Family  History  Problem Relation Age of Onset   Heart failure Mother        a. onset in her 69s   Diabetes Mother    Hypertension Father    Diabetes Father     Social History   Socioeconomic History   Marital status: Single    Spouse name: Not on file   Number of children: Not on file   Years of education: Not on file   Highest education level: Not on file  Occupational History   Not on file  Tobacco Use   Smoking status: Former    Packs/day: 0.50    Types: Cigarettes    Quit date: 2022    Years since quitting: 1.6   Smokeless tobacco: Never  Vaping Use   Vaping Use: Never used  Substance and Sexual Activity    Alcohol use: Yes    Comment: ~ 4 shots/day on weekends only   Drug use: Not Currently    Types: Cocaine    Comment: Last day of use was prior to June 2021 hosp.visit.    Sexual activity: Not Currently    Birth control/protection: None  Other Topics Concern   Not on file  Social History Narrative   Not on file   Social Determinants of Health   Financial Resource Strain: High Risk (06/16/2020)   Overall Financial Resource Strain (CARDIA)    Difficulty of Paying Living Expenses: Hard  Food Insecurity: Food Insecurity Present (06/16/2020)   Hunger Vital Sign    Worried About Charity fundraiser in the Last Year: Often true    Ran Out of Food in the Last Year: Often true  Transportation Needs: Unmet Transportation Needs (06/16/2020)   PRAPARE - Hydrologist (Medical): Yes    Lack of Transportation (Non-Medical): Yes  Physical Activity: Not on file  Stress: Not on file  Social Connections: Not on file  Intimate Partner Violence: Not on file    Outpatient Medications Prior to Visit  Medication Sig Dispense Refill   acetaminophen (TYLENOL) 500 MG tablet Take 1,000 mg by mouth every 6 (six) hours as needed (for pain.).     atorvastatin (LIPITOR) 20 MG tablet TAKE 1 TABLET BY MOUTH DAILY (Patient not taking: Reported on 12/16/2021) 90 tablet 3   blood glucose meter kit and supplies KIT Dispense based on patient and insurance preference. Use up to four times daily as directed. (FOR ICD-9 250.00, 250.01). 1 each 0   carvedilol (COREG) 6.25 MG tablet Take 1 tablet (6.25 mg total) by mouth 2 (two) times daily with a meal. 180 tablet 3   dapagliflozin propanediol (FARXIGA) 10 MG TABS tablet Take 1 tablet (10 mg total) by mouth once daily. 90 tablet 3   famotidine (PEPCID) 20 MG tablet Take 1 tablet (20 mg total) by mouth 2 (two) times daily. (Patient not taking: Reported on 12/16/2021) 90 tablet 3   insulin glargine (LANTUS SOLOSTAR) 100 UNIT/ML Solostar Pen Inject 6  Units into the skin 2 (two) times daily. 15 mL PRN   Insulin Pen Needle (PEN NEEDLES 3/16") 31G X 5 MM MISC 1 each by Does not apply route 2 (two) times daily. 100 each 3   meloxicam (MOBIC) 15 MG tablet Take 1 tablet (15 mg total) by mouth daily. 30 tablet 0   norethindrone (AYGESTIN) 5 MG tablet Take 1 tablet (5 mg total) by mouth daily. 90 tablet 0   potassium chloride SA (KLOR-CON M) 20 MEQ tablet Take  0.5 tablets (10 mEq total) by mouth daily. 45 tablet 3   sacubitril-valsartan (ENTRESTO) 97-103 MG Take 1 tablet by mouth 2 (two) times daily. 180 tablet 3   spironolactone (ALDACTONE) 25 MG tablet TAKE 1 TABLET BY MOUTH DAILY (Patient not taking: Reported on 12/16/2021) 90 tablet 3   torsemide (DEMADEX) 20 MG tablet Take 60 mg by mouth daily.     No facility-administered medications prior to visit.    Allergies  Allergen Reactions   No [employer] Food Allergies Rash and Other (See Comments)    Lemons    ROS Review of Systems  Constitutional:  Negative for chills and fever.  Respiratory:  Negative for cough.   Cardiovascular:  Negative for chest pain.  Gastrointestinal:  Negative for diarrhea, nausea and vomiting.  Genitourinary:  Positive for menstrual problem.  Skin: Negative.   Neurological:  Positive for light-headedness.      Objective:    Physical Exam Constitutional:      Appearance: Normal appearance.  HENT:     Head: Normocephalic and atraumatic.  Eyes:     Conjunctiva/sclera: Conjunctivae normal.  Cardiovascular:     Rate and Rhythm: Normal rate and regular rhythm.  Pulmonary:     Effort: Pulmonary effort is normal.     Breath sounds: Normal breath sounds.  Musculoskeletal:     Right lower leg: No edema.     Left lower leg: No edema.  Skin:    General: Skin is warm and dry.  Neurological:     General: No focal deficit present.     Mental Status: She is alert. Mental status is at baseline.  Psychiatric:        Mood and Affect: Mood normal.         Behavior: Behavior normal.     There were no vitals taken for this visit. Wt Readings from Last 3 Encounters:  12/16/21 233 lb 8 oz (105.9 kg)  11/09/21 229 lb 12.8 oz (104.2 kg)  11/05/21 221 lb 6 oz (100.4 kg)     Health Maintenance Due  Topic Date Due   COVID-19 Vaccine (1) Never done   OPHTHALMOLOGY EXAM  Never done   INFLUENZA VACCINE  12/01/2021    There are no preventive care reminders to display for this patient.  Lab Results  Component Value Date   TSH 5.114 (H) 11/09/2021   Lab Results  Component Value Date   WBC 7.2 11/09/2021   HGB 7.3 (L) 11/09/2021   HCT 25.4 (L) 11/09/2021   MCV 74.7 (L) 11/09/2021   PLT 449 (H) 11/09/2021   Lab Results  Component Value Date   NA 134 (L) 11/09/2021   K 4.5 11/09/2021   CO2 24 11/09/2021   GLUCOSE 171 (H) 11/09/2021   BUN 24 (H) 11/09/2021   CREATININE 1.17 (H) 11/09/2021   BILITOT 0.7 11/09/2021   ALKPHOS 105 11/09/2021   AST 18 11/09/2021   ALT 16 11/09/2021   PROT 5.8 (L) 11/09/2021   ALBUMIN 2.0 (L) 11/09/2021   CALCIUM 7.9 (L) 11/09/2021   ANIONGAP 9 11/09/2021   EGFR 52 (L) 06/12/2021   Lab Results  Component Value Date   CHOL 286 (H) 06/12/2021   Lab Results  Component Value Date   HDL 74 06/12/2021   Lab Results  Component Value Date   LDLCALC 171 (H) 06/12/2021   Lab Results  Component Value Date   TRIG 242 (H) 06/12/2021   Lab Results  Component Value Date   CHOLHDL 3.9  06/12/2021   Lab Results  Component Value Date   HGBA1C 10.5 (H) 11/09/2021      Assessment & Plan:   1. 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 In the meantime continue Lantus 6 units BID although based on her sugars her A1c will most likely be the same and her insulin will be increased.   - C-peptide; Future - HgB A1c; Future  2. HFrEF (heart failure with reduced ejection fraction) ([place]): Following with  Cardiology, note from 10/07/21 reviewed. Continue Torsemide 30 once daily, Carvedilol 6.25, Spirnolactone 25, Entresto and KCl 10. Cannot have ICD placed because of low hbg.   3. Chronic anemia: Stat labs to recheck hemoglobin will most likely require blood transfusion. Check iron panel as well, had been on oral iron previously but not currently. Check TSH, CMP as well. Anemia most likely iron deficiency secondary to menorrhagia - needs to pick up birth control and start right away to help with blood loss. No other blood loss, no blood in stools or family history of colon cancer.   - CBC w/Diff/Platelet; Future - Fe+TIBC+Fer; Future - TSH; Future - Comprehensive Metabolic Panel (CMET); Future  Follow-up: No follow-ups on file.    [name], DO

## 2022-01-05 ENCOUNTER — Ambulatory Visit: Payer: Medicaid Other | Admitting: Nurse Practitioner

## 2022-01-05 ENCOUNTER — Telehealth (HOSPITAL_COMMUNITY): Payer: Self-pay

## 2022-01-05 ENCOUNTER — Encounter: Payer: Self-pay | Admitting: Nurse Practitioner

## 2022-01-05 NOTE — Progress Notes (Deleted)
Office Visit    Patient Name: Molly Howard Date of Encounter: 01/05/2022  Primary Care Provider:  [name], DO Primary Cardiologist:  [name], MD  Chief Complaint    36 year old female with a history of HFrEF, nonischemic cardiomyopathy, hypertension, type 2 diabetes mellitus, stage II chronic kidney disease, GERD, microcytic anemia, obesity, medication nonadherence, and polysubstance abuse, who presents for follow-up related to CHF.  Past Medical History    Past Medical History:  Diagnosis Date   Acid reflux    Chronic HFrEF (heart failure with reduced ejection fraction) ([place])    a. 10/2019 Echo: EF 35-40%, GrII DD; b. 05/2020 Echo: EF 20-25%, glob HK; c. 10/2020 Echo: EF 30-35%, glob HK. GrII DD, Mildly red RV fxn. Mod TR; d.  05/2021 cMRI: EF 42%, no LGE. Nl RV size/fxn.   CKD (chronic kidney disease), stage II    Diabetes mellitus ([place])    H/O medication noncompliance    Hypertension    Microcytic anemia    NICM (nonischemic cardiomyopathy) ([place])    a. 10/2019 Echo: EF 35-40%; b. 10/2019 MV: No ischemia. Small apical defect-->breast attenuation; c. 05/2020 Echo: EF 20-25%; d. 10/2020 Echo: EF 30-35%, glob HK. GrII DD, Mildly red RV fxn. Mod TR; e. 05/2021 cMRI: EF 42%, no LGE. Nl RV size/fxn.   Obesity    Polysubstance abuse ([place])    Past Surgical History:  Procedure Laterality Date   CHOLECYSTECTOMY     HERNIA REPAIR      Allergies  Allergies  Allergen Reactions   No [employer] Food Allergies Rash and Other (See Comments)    Lemons    History of Present Illness    35 year old female with the above past medical history including HFrEF, nonischemic cardiomyopathy, hypertension, diabetes, stage II chronic kidney disease, GERD, microcytic anemia, obesity, medication nonadherence, and polysubstance abuse.  She was initially diagnosed with heart failure when she presented in June 2021 with dyspnea and volume overload.  Echocardiogram at that time showed  an EF of 35 to 40% with grade 2 diastolic dysfunction.  This was followed by stress testing, which was nonischemic, and she was treated for nonischemic cardiomyopathy.  Unfortunately, she has struggled affording her medications.  In January 2022, she was admitted with COVID-19 and found to have an EF of 20 to 25%.  Following this, she was followed closely in cardiology and heart failure clinic, and placed on GDMT.  She was readmitted in June 2022 in the setting of cocaine use.  Echocardiogram during that admission showed an EF of 30 to 35% with global hypokinesis, grade 2 diastolic dysfunction, mildly reduced RV function, and moderate TR.  Following discharge, she did not pick up any of her medications and required readmission in July 2022 due to marked volume overload.  In the setting of ongoing LV dysfunction despite maximally tolerated GDMT, she was referred to electrophysiology.  Cardiac MRI was performed and showed an EF of 42% with no LGE, and normal RV function.  In this setting, ICD therapy was not indicated.  She was referred to genetic counseling in the setting of her mother also having a cardiomyopathy.  A follow-up limited echo in May 2023 showed an EF of 25 to 30%, and she was referred back to electrophysiology.  At most recent electrophysiology follow-up on June 7, Ms. Morgenstern reported an episode of syncope that occurred while standing.  Decision was made to pursue placement of a subcutaneous AICD, however, lab work obtained in clinic that day  showed a hemoglobin of 8.1 with hematocrit of 27.1, which was down from baseline.  ICD placement was canceled.  She has followed up with her primary care provider and reported menorrhagia.  Follow-up labs on July 10 showed a hemoglobin of 7.3 with hematocrit of 25.4, and 8.6 and 28.0 respectively on July 29.  Home Medications    Current Outpatient Medications  Medication Sig Dispense Refill   acetaminophen (TYLENOL) 500 MG tablet Take 1,000 mg by mouth  every 6 (six) hours as needed (for pain.).     atorvastatin (LIPITOR) 20 MG tablet TAKE 1 TABLET BY MOUTH DAILY (Patient not taking: Reported on 12/16/2021) 90 tablet 3   blood glucose meter kit and supplies KIT Dispense based on patient and insurance preference. Use up to four times daily as directed. (FOR ICD-9 250.00, 250.01). 1 each 0   carvedilol (COREG) 6.25 MG tablet Take 1 tablet (6.25 mg total) by mouth 2 (two) times daily with a meal. 180 tablet 3   dapagliflozin propanediol (FARXIGA) 10 MG TABS tablet Take 1 tablet (10 mg total) by mouth once daily. 90 tablet 3   famotidine (PEPCID) 20 MG tablet Take 1 tablet (20 mg total) by mouth 2 (two) times daily. (Patient not taking: Reported on 12/16/2021) 90 tablet 3   insulin glargine (LANTUS SOLOSTAR) 100 UNIT/ML Solostar Pen Inject 6 Units into the skin 2 (two) times daily. 15 mL PRN   Insulin Pen Needle (PEN NEEDLES 3/16") 31G X 5 MM MISC 1 each by Does not apply route 2 (two) times daily. 100 each 3   meloxicam (MOBIC) 15 MG tablet Take 1 tablet (15 mg total) by mouth daily. 30 tablet 0   norethindrone (AYGESTIN) 5 MG tablet Take 1 tablet (5 mg total) by mouth daily. 90 tablet 0   potassium chloride SA (KLOR-CON M) 20 MEQ tablet Take 0.5 tablets (10 mEq total) by mouth daily. 45 tablet 3   sacubitril-valsartan (ENTRESTO) 97-103 MG Take 1 tablet by mouth 2 (two) times daily. 180 tablet 3   spironolactone (ALDACTONE) 25 MG tablet TAKE 1 TABLET BY MOUTH DAILY (Patient not taking: Reported on 12/16/2021) 90 tablet 3   torsemide (DEMADEX) 20 MG tablet Take 60 mg by mouth daily.     No current facility-administered medications for this visit.     Review of Systems    ***.  All other systems reviewed and are otherwise negative except as noted above.    Physical Exam    VS:  There were no vitals taken for this visit. , BMI There is no height or weight on file to calculate BMI.     GEN: Well nourished, well developed, in no acute  distress. HEENT: normal. Neck: Supple, no JVD, carotid bruits, or masses. Cardiac: RRR, no murmurs, rubs, or gallops. No clubbing, cyanosis, edema.  Radials/DP/PT 2+ and equal bilaterally.  Respiratory:  Respirations regular and unlabored, clear to auscultation bilaterally. GI: Soft, nontender, nondistended, BS + x 4. MS: no deformity or atrophy. Skin: warm and dry, no rash. Neuro:  Strength and sensation are intact. Psych: Normal affect.  Accessory Clinical Findings    ECG personally reviewed by me today - *** - no acute changes.  Lab Results  Component Value Date   WBC 7.2 11/09/2021   HGB 7.3 (L) 11/09/2021   HCT 25.4 (L) 11/09/2021   MCV 74.7 (L) 11/09/2021   PLT 449 (H) 11/09/2021   Lab Results  Component Value Date   CREATININE 1.17 (H) 11/09/2021  BUN 24 (H) 11/09/2021   NA 134 (L) 11/09/2021   K 4.5 11/09/2021   CL 101 11/09/2021   CO2 24 11/09/2021   Lab Results  Component Value Date   ALT 16 11/09/2021   AST 18 11/09/2021   ALKPHOS 105 11/09/2021   BILITOT 0.7 11/09/2021   Lab Results  Component Value Date   CHOL 286 (H) 06/12/2021   HDL 74 06/12/2021   LDLCALC 171 (H) 06/12/2021   TRIG 242 (H) 06/12/2021   CHOLHDL 3.9 06/12/2021    Lab Results  Component Value Date   HGBA1C 10.5 (H) 11/09/2021    Assessment & Plan    1.  ***   Murray Hodgkins, NP 01/05/2022, 8:26 AM

## 2022-01-05 NOTE — Telephone Encounter (Signed)
Molly Howard contacted me and advised her insurance ride showed up an hour early and she was not ready.  They left.  Went to cardiology and they attempted to to call [employer] but they were unable to pick her up for her appt today.  Had to reschedule her appt.  Advised her of new appt and reminded her of appt next week with [facility].  I have set up transportation through her insurance.   Earmon [place] EMT-Paramedic [phone]

## 2022-01-11 NOTE — Progress Notes (Deleted)
Patient ID: Molly Howard, female    DOB: [birth year], 36 y.o.   MRN: [id number]  HPI  [name] is a 36 y/o female with a history of DM, HTN, previous drug use and chronic heart failure.   Echo report from 09/18/21 reviewed and showed an EF of 25-30%. Echo report from 06/30/21 reviewed and showed an EF of 30-35% along with mild MR. Echo report from 03/20/21 reviewed and showed an EF of 25-30% along with severely elevated PA pressure of 70.8 mmHg, mild LAE, mild MR and mild/moderate TR. Echo report from 10/11/20 reviewed and showed an EF of 30-35% along with moderate TR.   Cardiac MRI 05/19/21 showed an EF of 42%  Was in the ED 11/28/21 due to HF exacerbation. Given additional diuretic with improvement of symptoms and she was released. Was in the ED 11/10/21 due to hemoglobin of 8.0. Transfusion given and she was released. Was in the ED 10/21/21 due to acute right knee pain where she was evaluated and released. Was in the ED 08/08/21 due to left shoulder impingement where she was evaluated and released. Was in the ED 07/21/21 due to increased swelling of her hands arms and face.  Also endorsing increased dyspnea and intermittent chest pain and 15 pound weight gain.   She presents today for a follow-up visit with a chief complaint of    Was supposed to have ICD placed but has become anemic and they are wanting this to be corrected prior to insertion.   Past Medical History:  Diagnosis Date   Acid reflux    Chronic HFrEF (heart failure with reduced ejection fraction) (HCC)    a. 10/2019 Echo: EF 35-40%, GrII DD; b. 05/2020 Echo: EF 20-25%, glob HK; c. 10/2020 Echo: EF 30-35%, glob HK. GrII DD, Mildly red RV fxn. Mod TR; d.  05/2021 cMRI: EF 42%, no LGE. Nl RV size/fxn.   CKD (chronic kidney disease), stage II    Diabetes mellitus (HCC)    H/O medication noncompliance    Hypertension    Microcytic anemia    NICM (nonischemic cardiomyopathy) (HCC)    a. 10/2019 Echo: EF 35-40%; b. 10/2019 MV: No ischemia.  Small apical defect-->breast attenuation; c. 05/2020 Echo: EF 20-25%; d. 10/2020 Echo: EF 30-35%, glob HK. GrII DD, Mildly red RV fxn. Mod TR; e. 05/2021 cMRI: EF 42%, no LGE. Nl RV size/fxn.   Obesity    Polysubstance abuse (HCC)    Past Surgical History:  Procedure Laterality Date   CHOLECYSTECTOMY     HERNIA REPAIR     Family History  Problem Relation Age of Onset   Heart failure Mother        a. onset in her 12s   Diabetes Mother    Hypertension Father    Diabetes Father    Social History   Tobacco Use   Smoking status: Former    Packs/day: 0.50    Types: Cigarettes    Quit date: 2022    Years since quitting: 1.6   Smokeless tobacco: Never  Substance Use Topics   Alcohol use: Yes    Comment: ~ 4 shots/day on weekends only   Allergies  Allergen Reactions   No [employer] Food Allergies Rash and Other (See Comments)    Lemons     Review of Systems  Constitutional:  Positive for fatigue. Negative for appetite change.  HENT:  Positive for hearing loss (prior to falls/ passing out). Negative for congestion, postnasal drip and sore throat.  Eyes: Negative.   Respiratory:  Positive for cough (productive), shortness of breath and wheezing.   Cardiovascular:  Positive for palpitations and leg swelling. Negative for chest pain.  Gastrointestinal:  Positive for abdominal distention. Negative for abdominal pain.  Endocrine: Negative.   Genitourinary: Negative.   Musculoskeletal:  Positive for arthralgias (right knee) and back pain.  Skin: Negative.   Allergic/Immunologic: Negative.   Neurological:  Positive for light-headedness. Negative for dizziness.  Hematological:  Negative for adenopathy. Does not bruise/bleed easily.  Psychiatric/Behavioral:  Positive for sleep disturbance (sleeping on 2 pillows). Negative for dysphoric mood. The patient is not nervous/anxious.      Physical Exam Vitals and nursing note reviewed.  Constitutional:      Appearance: Normal  appearance.  HENT:     Head: Normocephalic and atraumatic.  Cardiovascular:     Rate and Rhythm: Regular rhythm. Tachycardia present.  Pulmonary:     Effort: Pulmonary effort is normal. No respiratory distress.     Breath sounds: No wheezing or rales.  Abdominal:     General: There is no distension.     Palpations: Abdomen is soft.     Tenderness: There is no abdominal tenderness.  Musculoskeletal:        General: No tenderness.     Cervical back: Normal range of motion and neck supple.     Right lower leg: No tenderness. Edema (2+ pitting) present.     Left lower leg: No tenderness. Edema (2+ pitting) present.  Skin:    General: Skin is warm and dry.  Neurological:     General: No focal deficit present.     Mental Status: She is alert and oriented to person, place, and time.  Psychiatric:        Mood and Affect: Mood normal.        Behavior: Behavior normal.        Thought Content: Thought content normal.     Assessment & Plan:  1: Chronic heart failure with reduced ejection fraction- - NYHA class III - euvolemic today - weighing daily; reminded to call for an overnight weight gain of > 2 pounds or a weekly weight gain of > 5 pounds - weight 233.8 pounds from last visit here 1 month - saw cardiology Molly Howard) 09/14/21; returns 02/09/22 - saw EP Molly Howard) 10/07/21; was to have ICD implanted 10/12/21 but it was cancelled due to anemia - on GDMT of carvedilol, entresto, farxiga and spironolactone  - again, emphasized the importance of keeping daily fluid intake to 60-64 ounces/ daily which would include the ice she's eating - participating in paramedicine program  - says that she's ordered compression socks and is waiting for them to come in the mail; encouraged her to elevate her legs when sitting for long periods of time - BNP 11/28/21 was 1330.0  2: HTN- - BP  - saw PCP Molly Howard) 11/09/21; NS on 12/18/21 - BMP 11/28/21 reviewed and showed sodium 135, potassium 3.6, creatinine  1.02 & GFR >60  3: DM- - A1c 11/09/21 was 10.5%  4: Anemia- - CBC 11/28/21 reviewed and showed hemoglobin 8.6 - has had blood transfusions in the past  5: Seizures as a child- - patient says that she had seizures as a child but medication was stopped later because she was "growing out of it" - has had frequent falls and right before falling, she loses her hearing completely, falls and then passes out - appt was scheduled with PCP for 12/18/21 and she was a  NS for this   Patient did not bring her medications nor a list. Each medication was verbally reviewed with the patient and she was encouraged to bring the bottles to every visit to confirm accuracy of list.

## 2022-01-12 ENCOUNTER — Ambulatory Visit: Payer: Medicaid Other | Admitting: Family

## 2022-01-13 ENCOUNTER — Encounter: Payer: Medicaid Other | Admitting: Cardiology

## 2022-01-15 ENCOUNTER — Encounter: Payer: Self-pay | Admitting: Family

## 2022-01-15 ENCOUNTER — Ambulatory Visit (HOSPITAL_BASED_OUTPATIENT_CLINIC_OR_DEPARTMENT_OTHER): Payer: Medicaid Other | Admitting: Family

## 2022-01-15 ENCOUNTER — Other Ambulatory Visit
Admission: RE | Admit: 2022-01-15 | Discharge: 2022-01-15 | Disposition: A | Discharge status: Home / Self Care | Payer: Medicaid Other | Source: Ambulatory Visit | Attending: Family | Admitting: Family

## 2022-01-15 DIAGNOSIS — D649 Anemia, unspecified: Secondary | ICD-10-CM | POA: Insufficient documentation

## 2022-01-15 DIAGNOSIS — D509 Iron deficiency anemia, unspecified: Secondary | ICD-10-CM | POA: Diagnosis not present

## 2022-01-15 DIAGNOSIS — E1165 Type 2 diabetes mellitus with hyperglycemia: Secondary | ICD-10-CM | POA: Diagnosis not present

## 2022-01-15 DIAGNOSIS — R5383 Other fatigue: Secondary | ICD-10-CM | POA: Insufficient documentation

## 2022-01-15 DIAGNOSIS — Z79899 Other long term (current) drug therapy: Secondary | ICD-10-CM | POA: Insufficient documentation

## 2022-01-15 DIAGNOSIS — I5023 Acute on chronic systolic (congestive) heart failure: Secondary | ICD-10-CM

## 2022-01-15 DIAGNOSIS — I10 Essential (primary) hypertension: Secondary | ICD-10-CM | POA: Diagnosis not present

## 2022-01-15 DIAGNOSIS — I5020 Unspecified systolic (congestive) heart failure: Secondary | ICD-10-CM | POA: Diagnosis present

## 2022-01-15 DIAGNOSIS — R296 Repeated falls: Secondary | ICD-10-CM | POA: Insufficient documentation

## 2022-01-15 DIAGNOSIS — R140 Abdominal distension (gaseous): Secondary | ICD-10-CM | POA: Insufficient documentation

## 2022-01-15 DIAGNOSIS — Z794 Long term (current) use of insulin: Secondary | ICD-10-CM

## 2022-01-15 DIAGNOSIS — I110 Hypertensive heart disease with heart failure: Secondary | ICD-10-CM | POA: Insufficient documentation

## 2022-01-15 DIAGNOSIS — E119 Type 2 diabetes mellitus without complications: Secondary | ICD-10-CM | POA: Insufficient documentation

## 2022-01-15 DIAGNOSIS — R0602 Shortness of breath: Secondary | ICD-10-CM | POA: Insufficient documentation

## 2022-01-15 DIAGNOSIS — Z7984 Long term (current) use of oral hypoglycemic drugs: Secondary | ICD-10-CM | POA: Insufficient documentation

## 2022-01-15 DIAGNOSIS — R059 Cough, unspecified: Secondary | ICD-10-CM | POA: Insufficient documentation

## 2022-01-15 LAB — BASIC METABOLIC PANEL
Anion gap: 6 (ref 5–15)
BUN: 26 mg/dL — ABNORMAL HIGH (ref 6–20)
CO2: 25 mmol/L (ref 22–32)
Calcium: 7.8 mg/dL — ABNORMAL LOW (ref 8.9–10.3)
Chloride: 107 mmol/L (ref 98–111)
Creatinine, Ser: 1.33 mg/dL — ABNORMAL HIGH (ref 0.44–1.00)
GFR, Estimated: 53 mL/min — ABNORMAL LOW (ref >60)
Glucose, Bld: 258 mg/dL — ABNORMAL HIGH (ref 70–99)
Potassium: 4.4 mmol/L (ref 3.5–5.1)
Sodium: 138 mmol/L (ref 135–145)

## 2022-01-15 LAB — BRAIN NATRIURETIC PEPTIDE: B Natriuretic Peptide: 792.8 pg/mL — ABNORMAL HIGH (ref 0.0–100.0)

## 2022-01-15 NOTE — Progress Notes (Signed)
Patient ID: Molly Howard, female    DOB: 03-24-1986, 36 y.o.   MRN: [id number]  HPI  Molly Howard is a 36 y/o female with a history of DM, HTN, previous drug use and chronic heart failure.   Echo report from 09/18/21 reviewed and showed an EF of 25-30%. Echo report from 06/30/21 reviewed and showed an EF of 30-35% along with mild MR. Echo report from 03/20/21 reviewed and showed an EF of 25-30% along with severely elevated PA pressure of 70.8 mmHg, mild LAE, mild MR and mild/moderate TR. Echo report from 10/11/20 reviewed and showed an EF of 30-35% along with moderate TR.   Cardiac MRI 05/19/21 showed an EF of 42%  Was in the ED 11/28/21 due to HF exacerbation. Given additional diuretic with improvement of symptoms and she was released. Was in the ED 11/10/21 due to hemoglobin of 8.0. Transfusion given and she was released. Was in the ED 10/21/21 due to acute right knee pain where she was evaluated and released. Was in the ED 08/08/21 due to left shoulder impingement where she was evaluated and released. Was in the ED 07/21/21 due to increased swelling of her hands arms and face.  Also endorsing increased dyspnea and intermittent chest pain and 15 pound weight gain.   She presents today for a follow-up visit with a chief complaint of moderate fatigue with minimal exertion, SOB, abdominal distention, and cough. Describes this as worse than normal.She has associated wheezing, swelling in lower extremities, palpitations, light-headedness, difficulty sleeping, chronic pain, frequent falls and weight gain along with this. Denies chest pain.  Does not weigh daily but takes blood sugars once daily. Says that she follows a low-sodium diet and monitors fluid intake. When asked for details, she says that she's been eating chicken from either [employer] or ch[employer]. Says that she drinks 2 bottles of water (32 oz), 20 oz soda and a cup of juice daily but doesn't know how many ounces that cup holds  Was supposed to have ICD  placed but has become anemic and they are wanting this to be corrected prior to insertion.   Past Medical History:  Diagnosis Date   Acid reflux    Chronic HFrEF (heart failure with reduced ejection fraction) ([facility])    a. 10/2019 Echo: EF 35-40%, GrII DD; b. 05/2020 Echo: EF 20-25%, glob HK; c. 10/2020 Echo: EF 30-35%, glob HK. GrII DD, Mildly red RV fxn. Mod TR; d.  05/2021 cMRI: EF 42%, no LGE. Nl RV size/fxn.   CKD (chronic kidney disease), stage II    Diabetes mellitus ([facility])    H/O medication noncompliance    Hypertension    Microcytic anemia    NICM (nonischemic cardiomyopathy) ([facility])    a. 10/2019 Echo: EF 35-40%; b. 10/2019 MV: No ischemia. Small apical defect-->breast attenuation; c. 05/2020 Echo: EF 20-25%; d. 10/2020 Echo: EF 30-35%, glob HK. GrII DD, Mildly red RV fxn. Mod TR; e. 05/2021 cMRI: EF 42%, no LGE. Nl RV size/fxn.   Obesity    Polysubstance abuse ([place])    Past Surgical History:  Procedure Laterality Date   CHOLECYSTECTOMY     HERNIA REPAIR     Family History  Problem Relation Age of Onset   Heart failure Mother        a. onset in her 69s   Diabetes Mother    Hypertension Father    Diabetes Father    Social History   Tobacco Use   Smoking status: Former  Packs/day: 0.50    Types: Cigarettes    Quit date: 2022    Years since quitting: 1.7   Smokeless tobacco: Never  Substance Use Topics   Alcohol use: Yes    Comment: ~ 4 shots/day on weekends only   Allergies  Allergen Reactions   No [employer] Food Allergies Rash and Other (See Comments)    Lemons   Prior to Admission medications   Medication Sig Start Date End Date Taking? Authorizing Provider  acetaminophen (TYLENOL) 500 MG tablet Take 1,000 mg by mouth every 6 (six) hours as needed (for pain.).   Yes [provider]  atorvastatin (LIPITOR) 20 MG tablet TAKE 1 TABLET BY MOUTH DAILY 12/14/21  Yes Darylene Price A, FNP  carvedilol (COREG) 6.25 MG tablet Take 1 tablet (6.25 mg total) by mouth 2  (two) times daily with a meal. 12/11/20  Yes Darylene Price A, FNP  dapagliflozin propanediol (FARXIGA) 10 MG TABS tablet Take 1 tablet (10 mg total) by mouth once daily. 11/05/21  Yes Darylene Price A, FNP  famotidine (PEPCID) 20 MG tablet Take 1 tablet (20 mg total) by mouth 2 (two) times daily. 06/12/21  Yes [name], DO  insulin glargine (LANTUS SOLOSTAR) 100 UNIT/ML Solostar Pen Inject 6 Units into the skin 2 (two) times daily. 07/16/21  Yes [name], DO  Insulin Pen Needle (PEN NEEDLES 3/16") 31G X 5 MM MISC 1 each by Does not apply route 2 (two) times daily. 07/16/21  Yes [name], DO  meloxicam (MOBIC) 15 MG tablet Take 1 tablet (15 mg total) by mouth daily. 08/08/21 08/08/22 Yes Cuthriell, Charline Bills, PA-C  potassium chloride SA (KLOR-CON M) 20 MEQ tablet Take 0.5 tablets (10 mEq total) by mouth daily. 04/29/21  Yes Hackney, Tina A, FNP  sacubitril-valsartan (ENTRESTO) 97-103 MG Take 1 tablet by mouth 2 (two) times daily. 07/02/21  Yes [name], MD  spironolactone (ALDACTONE) 25 MG tablet TAKE 1 TABLET BY MOUTH DAILY 12/14/21  Yes Darylene Price A, FNP  torsemide (DEMADEX) 20 MG tablet Take 60 mg by mouth daily. 03/31/21  Yes [provider]  blood glucose meter kit and supplies KIT Dispense based on patient and insurance preference. Use up to four times daily as directed. (FOR ICD-9 250.00, 250.01). Patient not taking: Reported on 01/15/2022 12/08/20   Max Sane, MD  norethindrone (AYGESTIN) 5 MG tablet Take 1 tablet (5 mg total) by mouth daily. Patient not taking: Reported on 01/15/2022 01/16/37   Copland, Deirdre Evener, PA-C     Review of Systems  Constitutional:  Positive for fatigue. Negative for appetite change.  HENT:  Positive for hearing loss (prior to falls/ passing out). Negative for congestion, postnasal drip and sore throat.   Eyes: Negative.   Respiratory:  Positive for cough (productive), shortness of breath and wheezing.   Cardiovascular:   Positive for palpitations and leg swelling. Negative for chest pain.  Gastrointestinal:  Positive for abdominal distention. Negative for abdominal pain.  Endocrine: Negative.   Genitourinary: Negative.   Musculoskeletal:  Positive for arthralgias (right knee) and back pain.  Skin: Negative.   Allergic/Immunologic: Negative.   Neurological:  Positive for light-headedness. Negative for dizziness.  Hematological:  Negative for adenopathy. Does not bruise/bleed easily.  Psychiatric/Behavioral:  Positive for sleep disturbance (sleeping on 2 pillows). Negative for dysphoric mood. The patient is not nervous/anxious.    Vitals:   01/15/22 1341  BP: (!) 134/93  Pulse: 99  Resp: 20  SpO2: 100%    Wt  Readings from Last 3 Encounters:  12/16/21 233 lb 8 oz (105.9 kg)  11/09/21 229 lb 12.8 oz (104.2 kg)  11/05/21 221 lb 6 oz (100.4 kg)   Lab Results  Component Value Date   CREATININE 1.17 (H) 11/09/2021   CREATININE 1.20 (H) 10/07/2021   CREATININE 1.05 (H) 09/14/2021   Physical Exam Vitals and nursing note reviewed.  Constitutional:      Appearance: Normal appearance.  HENT:     Head: Normocephalic and atraumatic.  Cardiovascular:     Rate and Rhythm: Normal rate and regular rhythm.  Pulmonary:     Effort: Pulmonary effort is normal. No respiratory distress.     Breath sounds: No wheezing or rales.  Abdominal:     General: There is distension.  Genitourinary:    General: Normal vulva.  Musculoskeletal:        General: No tenderness.     Cervical back: Normal range of motion and neck supple.     Right lower leg: No tenderness. Edema (2+ pitting) present.     Left lower leg: No tenderness. Edema (2+ pitting) present.  Skin:    General: Skin is warm and dry.  Neurological:     General: No focal deficit present.     Mental Status: She is alert and oriented to person, place, and time.  Psychiatric:        Mood and Affect: Mood normal.        Behavior: Behavior normal.         Thought Content: Thought content normal.     Assessment & Plan:  1: Acute on Chronic heart failure with reduced ejection fraction- - NYHA class III - fluid overloaded today - not weighing daily; reminded to call for an overnight weight gain of > 2 pounds or a weekly weight gain of > 5 pounds - weight up 8 pounds from last visit here 1 month ago - saw cardiology Kathlen Mody) 09/14/21; will get f/u appointment scheduled - saw EP Quentin Ore) 10/07/21; was to have ICD implanted 10/12/21 but it was cancelled due to anemia - on GDMT of carvedilol, entresto, farxiga and spironolactone  - again, emphasized the importance of keeping daily fluid intake to 60-64 ounces/ daily; instructed her to measure how many ounces her cup holds - reviewed furoscix use and patient watched the video and handled the demo kit - questions answered and she says that she's comfortable using this - to use furoscix today with an additional 1 tablet of potassium - if weight still up, abdomen still distended or still as short of breath, she can repeat this in 2 days - 2 furoscix samples provided - Patient educated today in Furoscix for home use today to help with the swelling - check BMP/ BNP today and then again in 3 days - participating in paramedicine program  - says that she's ordered compression socks and is waiting for them to come in the mail; encouraged her to elevate her legs when sitting for long periods of time - BNP 11/28/21 was 1330.0  2: HTN- - BP mildly elevated (134/93) - saw PCP Rosana Berger) 11/09/21 - BMP 11/28/21 reviewed and showed sodium 135, potassium 3.6, creatinine 1.02 & GFR >60 - BMP ordered for today  3: DM- - A1c 11/09/21 was 10.5% - BG 200's this AM  - Takes once daily  4: Anemia- - CBC 11/28/21 reviewed and showed hemoglobin 8.6 - received 1 unit blood last month   Medication bottles reviewed.   Return on Monday  01/18/22, sooner if needed.

## 2022-01-15 NOTE — Patient Instructions (Addendum)
Continue weighing daily and call for an overnight weight gain of 3 pounds or more or a weekly weight gain of more than 5 pounds.   Monitor fluid intake, drink between 60-64 oz daily and keep a log.  Follow a low-sodium diet.    Your provider has order Furoscix for you. This is an on-body infuser that gives you a dose of Furosemide.   It will be shipped to your home   [employer] will call you to discuss before shipping so, PLEASE answer unknown calls  For questions regarding the device call [employer] at [phone]  Ensure you write down the time you start your infusion so that if there is a problem you will know how long the infusion lasted  Use Furoscix only AS DIRECTED by our office  Dosing Directions:   Day 1= use furoscix today  (Friday) and take 1 extra potassium tablet  Day 2= if symptoms continue on Sunday, use furoscix and an extra potassium tablet

## 2022-01-18 ENCOUNTER — Other Ambulatory Visit (HOSPITAL_COMMUNITY): Payer: Self-pay

## 2022-01-18 ENCOUNTER — Telehealth: Payer: Self-pay | Admitting: Family

## 2022-01-18 ENCOUNTER — Ambulatory Visit: Payer: Medicaid Other | Admitting: Family

## 2022-01-18 NOTE — Progress Notes (Signed)
Today made a home visit with Molly Howard to check on her.  When spoke to earlier today she states she felt bad and would not get up to go to HF clinic appt.  She states feels ok except sinus drainage.  She states her allergies are acting up.  She states she can tell she has lost some fluid, she states urinated a lot and still is.  She stated her weight but she has not been weighing.  Advised her to weigh daily and text me the weights, she states she will.  Explained how to weigh.  Explained importance of weighing daily.  She took the 2 injections of lasix that Otila Kluver had given her from her HF clinic appt last week.  She has all her medications and she states taking them.  Attempted to call in her [employer] but they advised it is time to reenroll in to the program.  Cleora Fleet at [facility] clinic and she made her appt for next week.  Abdomen is swollen, some edema in lower legs.  Lungs are clear.  Attempted to discuss food but all I get is she does not eat much.  She wanted me to bring her a bojangles sausage biscuit, instead brought her a bagle with grilled chicken on it.  She appreciative of it.  Advised her importance of eating low sodium foods and measuring her fluids. Text her a list of appts and provided her website to make her transportation appts.  She states that will be much easier.  Will check with her to make sure she made the appts.  Will continue to visit for heart failure, diet and medication management.   [place] [phone]

## 2022-01-18 NOTE — Telephone Encounter (Signed)
K Staley (paramedicine) called to say that she spoke with patient this morning regarding her upcoming appt at the HF Clinic later this morning. Patient said she was still asleep and "didn't feel good" and wasn't coming to the appt. She did say that the furoscix injections that were given at her last appointment worked but that she just "didn't feel good". Patient did not specify what that exactly meant.   Explained that my schedule is extremely fully and if she needs to be seen later this week, it might not be an option. Paramedic informed patient of this possibility.  Appt and taxi were cancelled.

## 2022-01-20 ENCOUNTER — Encounter (HOSPITAL_COMMUNITY): Payer: Self-pay

## 2022-01-20 NOTE — Progress Notes (Signed)
Contacted several times to get her weight, attempted phone with message left and text with no response.   [place] [phone]

## 2022-01-27 ENCOUNTER — Encounter (HOSPITAL_COMMUNITY): Payer: Self-pay

## 2022-01-27 ENCOUNTER — Ambulatory Visit: Payer: Medicaid Other | Admitting: Family

## 2022-01-28 NOTE — Progress Notes (Signed)
Meliss advised she had a hair appt and needed to cancel her HF clinic appt.  She advised getting her hair was important.  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  She can not get no more refills til done.  She is aware she needs to provide income statement to HF clinic so they can recertify her. She states she will go to cardiology on the 10th. Did not make her another appt at HF clinic yet. Advsied HF clinic.   [place] [phone]

## 2022-02-04 ENCOUNTER — Ambulatory Visit (INDEPENDENT_AMBULATORY_CARE_PROVIDER_SITE_OTHER): Payer: Medicaid Other | Admitting: Internal Medicine

## 2022-02-04 ENCOUNTER — Encounter: Payer: Self-pay | Admitting: Internal Medicine

## 2022-02-04 VITALS — BP 124/76 | HR 102 | Temp 98.4°F | Resp 16 | Ht 67.0 in | Wt 252.8 lb

## 2022-02-04 DIAGNOSIS — R55 Syncope and collapse: Secondary | ICD-10-CM | POA: Diagnosis not present

## 2022-02-04 DIAGNOSIS — D649 Anemia, unspecified: Secondary | ICD-10-CM

## 2022-02-04 DIAGNOSIS — I5020 Unspecified systolic (congestive) heart failure: Secondary | ICD-10-CM | POA: Diagnosis not present

## 2022-02-04 DIAGNOSIS — E1129 Type 2 diabetes mellitus with other diabetic kidney complication: Secondary | ICD-10-CM

## 2022-02-04 DIAGNOSIS — Z23 Encounter for immunization: Secondary | ICD-10-CM

## 2022-02-04 LAB — CBC WITH DIFFERENTIAL/PLATELET
Absolute Monocytes: 561 cells/uL (ref 200–950)
Basophils Absolute: 82 cells/uL (ref 0–200)
Basophils Relative: 1.3 %
Eosinophils Absolute: 164 cells/uL (ref 15–500)
Eosinophils Relative: 2.6 %
HCT: 27.7 % — ABNORMAL LOW (ref 35.0–45.0)
Hemoglobin: 7.5 g/dL — ABNORMAL LOW (ref 11.7–15.5)
Lymphs Abs: 926 cells/uL (ref 850–3900)
MCH: 19.5 pg — ABNORMAL LOW (ref 27.0–33.0)
MCHC: 27.1 g/dL — ABNORMAL LOW (ref 32.0–36.0)
MCV: 71.9 fL — ABNORMAL LOW (ref 80.0–100.0)
MPV: 9.7 fL (ref 7.5–12.5)
Monocytes Relative: 8.9 %
Neutro Abs: 4568 cells/uL (ref 1500–7800)
Neutrophils Relative %: 72.5 %
Platelets: 443 10*3/uL — ABNORMAL HIGH (ref 140–400)
RBC: 3.85 10*6/uL (ref 3.80–5.10)
RDW: 19.4 % — ABNORMAL HIGH (ref 11.0–15.0)
Total Lymphocyte: 14.7 %
WBC: 6.3 10*3/uL (ref 3.8–10.8)

## 2022-02-04 MED ORDER — LANTUS SOLOSTAR 100 UNIT/ML ~~LOC~~ SOPN
PEN_INJECTOR | SUBCUTANEOUS | 99 refills | Status: DC
Start: 2022-02-04 — End: 2022-08-04

## 2022-02-04 NOTE — Progress Notes (Signed)
Established Patient Office Visit  Subjective:  Patient ID: Molly Howard, female    DOB: 04/21/1986  Age: 36 y.o. MRN: [id number]  CC:  Chief Complaint  Patient presents with   Follow-up   Hypertension   Hyperlipidemia   Diabetes    HPI Molly Howard presents for follow up.  Diabetes Type 2: -First diagnosed when she was a kid and told she was a type 1, but then told she was a type 2 later in life so uncertain  -Last H0T 8/88 10.5 -Medications: Fargixa 10, Lantus 10 units BID -Patient is compliant with the above medications and reports no side effects.  -Checking BG at home: fasting 250 average, evening 150, went to 70's in the afternoon once a few days ago  -Diet: recently cut out fried foods, tries to eat fruit for snacks but has been drinking [place] Orange Juice  -Exercise: tries to walk but gets short of breath with the heart failure  -Eye exam: April 22 scheduled but did not go - due  -Foot exam: UTD, saw Podiatrist and had nails cut  -Microalbumin: elevated, on Entresto and Farxiga  -Statin: yes -PNA vaccine: Prevnar 23 in 7/22 -Denies symptoms of hypoglycemia, polyuria, polydipsia, numbness extremities, foot ulcers/trauma.   Hypertension/HFrEF/NICM: -Medications: Torsemide 30 once daily, Carvedilol 6.25, Spirnolactone 25, Entresto and KCl 10 -Patient is compliant with above medications and reports no side effects. -Denies any SOB, CP, vision changes, LE edema or symptoms of hypotension -Follows with Cardiology note reviewed from 01/15/22 -Planning for subcutaneous ICD but hemoglobin low  -Last echo 09/18/21 showing reduced EF at 25-30% with global hypokinesis of the left ventricle  -She does endorse today some presyncopal and syncopal episodes.  She states she is passed out twice in the last several months, last of which was last month.  She states she was standing when she heard a "whooshing" sound in her ears and she lost consciousness.  She fell forward onto her  face.  Her mother and sister were there to help her, patient states she was unconscious for 5 to 10 minutes.  The patient states that she does feel unsteady when she stands up and lightheaded when standing at times.  HLD: -Medications: Lipitor 20 mg for one year -Patient is compliant with above medications and reports no side effects.  -Last lipid panel: Lipid Panel     Component Value Date/Time   CHOL 286 (H) 06/12/2021 1354   TRIG 242 (H) 06/12/2021 1354   HDL 74 06/12/2021 1354   CHOLHDL 3.9 06/12/2021 1354   VLDL 24 05/21/2020 0643   LDLCALC 171 (H) 06/12/2021 1354   GERD: -Not currently on daily medications, taking Pepcid as needed   CKD: -Last creatinine 9/23 1.33, GFR 53   Anemia: -Hgb 10.3 in 2/23, has menorrhagia, referred to Gyn who did Pap and vaginal [place]. US showing endometrial polyp and she was given an OCP in March but she has not started it or picked it up from the pharmacy yet.  - last hgb 7/23 8.6% -Having a period once a month - will bleed for 7 days, light for 3 days then heavy the last 4 days. Has to change pads/tampons every 1 hour on heavy days. Does have clots.   -Had been seeing gynecology and was prescribed an OCP, however the patient states she cannot afford this medication -She stating now that her menstrual cycles are more manageable.  Her LMP was a few days ago.  She states her  bleeding is much lighter and more tolerable.  Her usual menstrual cycle lasts about 1 week.  Past Medical History:  Diagnosis Date   Acid reflux    Chronic HFrEF (heart failure with reduced ejection fraction) ([place])    a. 10/2019 Echo: EF 35-40%, GrII DD; b. 05/2020 Echo: EF 20-25%, glob HK; c. 10/2020 Echo: EF 30-35%, glob HK. GrII DD, Mildly red RV fxn. Mod TR; d.  05/2021 cMRI: EF 42%, no LGE. Nl RV size/fxn.   CKD (chronic kidney disease), stage II    Diabetes mellitus ([place])    H/O medication noncompliance    Hypertension    Microcytic anemia    NICM (nonischemic cardiomyopathy)  ([place])    a. 10/2019 Echo: EF 35-40%; b. 10/2019 MV: No ischemia. Small apical defect-->breast attenuation; c. 05/2020 Echo: EF 20-25%; d. 10/2020 Echo: EF 30-35%, glob HK. GrII DD, Mildly red RV fxn. Mod TR; e. 05/2021 cMRI: EF 42%, no LGE. Nl RV size/fxn.   Obesity    Polysubstance abuse ([place])     Past Surgical History:  Procedure Laterality Date   CHOLECYSTECTOMY     HERNIA REPAIR      Family History  Problem Relation Age of Onset   Heart failure Mother        a. onset in her 36s   Diabetes Mother    Hypertension Father    Diabetes Father     Social History   Socioeconomic History   Marital status: Single    Spouse name: Not on file   Number of children: Not on file   Years of education: Not on file   Highest education level: Not on file  Occupational History   Not on file  Tobacco Use   Smoking status: Former    Packs/day: 0.50    Types: Cigarettes    Quit date: 2022    Years since quitting: 1.7   Smokeless tobacco: Never  Vaping Use   Vaping Use: Never used  Substance and Sexual Activity   Alcohol use: Yes    Comment: ~ 4 shots/day on weekends only   Drug use: Not Currently    Types: Cocaine    Comment: Last day of use was prior to June 2021 hosp.visit.    Sexual activity: Not Currently    Birth control/protection: None  Other Topics Concern   Not on file  Social History Narrative   Not on file   Social Determinants of Health   Financial Resource Strain: High Risk (06/16/2020)   Overall Financial Resource Strain (CARDIA)    Difficulty of Paying Living Expenses: Hard  Food Insecurity: Food Insecurity Present (06/16/2020)   Hunger Vital Sign    Worried About Charity fundraiser in the Last Year: Often true    Ran Out of Food in the Last Year: Often true  Transportation Needs: Unmet Transportation Needs (06/16/2020)   PRAPARE - Hydrologist (Medical): Yes    Lack of Transportation (Non-Medical): Yes  Physical Activity: Not on file   Stress: Not on file  Social Connections: Not on file  Intimate Partner Violence: Not on file    Outpatient Medications Prior to Visit  Medication Sig Dispense Refill   acetaminophen (TYLENOL) 500 MG tablet Take 1,000 mg by mouth every 6 (six) hours as needed (for pain.).     atorvastatin (LIPITOR) 20 MG tablet TAKE 1 TABLET BY MOUTH DAILY 90 tablet 3   blood glucose meter kit and supplies KIT Dispense based  on patient and insurance preference. Use up to four times daily as directed. (FOR ICD-9 250.00, 250.01). 1 each 0   carvedilol (COREG) 6.25 MG tablet Take 1 tablet (6.25 mg total) by mouth 2 (two) times daily with a meal. 180 tablet 3   dapagliflozin propanediol (FARXIGA) 10 MG TABS tablet Take 1 tablet (10 mg total) by mouth once daily. 90 tablet 3   famotidine (PEPCID) 20 MG tablet Take 1 tablet (20 mg total) by mouth 2 (two) times daily. 90 tablet 3   insulin glargine (LANTUS SOLOSTAR) 100 UNIT/ML Solostar Pen Inject 6 Units into the skin 2 (two) times daily. 15 mL PRN   Insulin Pen Needle (PEN NEEDLES 3/16") 31G X 5 MM MISC 1 each by Does not apply route 2 (two) times daily. 100 each 3   potassium chloride SA (KLOR-CON M) 20 MEQ tablet Take 0.5 tablets (10 mEq total) by mouth daily. 45 tablet 3   sacubitril-valsartan (ENTRESTO) 97-103 MG Take 1 tablet by mouth 2 (two) times daily. 180 tablet 3   spironolactone (ALDACTONE) 25 MG tablet TAKE 1 TABLET BY MOUTH DAILY 90 tablet 3   torsemide (DEMADEX) 20 MG tablet Take 60 mg by mouth daily.     meloxicam (MOBIC) 15 MG tablet Take 1 tablet (15 mg total) by mouth daily. (Patient not taking: Reported on 01/18/2022) 30 tablet 0   norethindrone (AYGESTIN) 5 MG tablet Take 1 tablet (5 mg total) by mouth daily. (Patient not taking: Reported on 01/15/2022) 90 tablet 0   No facility-administered medications prior to visit.    Allergies  Allergen Reactions   No [employer] Food Allergies Rash and Other (See Comments)    Lemons    ROS Review  of Systems  Constitutional:  Negative for chills and fever.  Respiratory:  Positive for cough and shortness of breath.   Cardiovascular:  Positive for leg swelling. Negative for chest pain.  Gastrointestinal:  Positive for abdominal distention. Negative for diarrhea, nausea and vomiting.  Skin: Negative.   Neurological:  Positive for syncope and light-headedness.      Objective:    Physical Exam Constitutional:      Appearance: Normal appearance.  HENT:     Head: Normocephalic and atraumatic.  Eyes:     Conjunctiva/sclera: Conjunctivae normal.  Cardiovascular:     Rate and Rhythm: Normal rate and regular rhythm.  Pulmonary:     Effort: Pulmonary effort is normal.     Breath sounds: Normal breath sounds. No rales.  Abdominal:     General: There is distension.  Musculoskeletal:     Right lower leg: Edema present.     Left lower leg: Edema present.     Comments: 1+ bilateral lower extremity pitting edema  Skin:    General: Skin is warm and dry.  Neurological:     General: No focal deficit present.     Mental Status: She is alert. Mental status is at baseline.  Psychiatric:        Mood and Affect: Mood normal.        Behavior: Behavior normal.     BP 124/76   Pulse (!) 102   Temp 98.4 F (36.9 C)   Resp 16   Ht '5\' 7"'  (1.702 m)   Wt 252 lb 12.8 oz (114.7 kg)   SpO2 98%   BMI 39.59 kg/m  Wt Readings from Last 3 Encounters:  02/04/22 252 lb 12.8 oz (114.7 kg)  01/18/22 238 lb (108 kg)  01/15/22 241  lb 2 oz (109.4 kg)     Health Maintenance Due  Topic Date Due   COVID-19 Vaccine (1) Never done   OPHTHALMOLOGY EXAM  Never done    There are no preventive care reminders to display for this patient.  Lab Results  Component Value Date   TSH 5.114 (H) 11/09/2021   Lab Results  Component Value Date   WBC 7.2 11/09/2021   HGB 7.3 (L) 11/09/2021   HCT 25.4 (L) 11/09/2021   MCV 74.7 (L) 11/09/2021   PLT 449 (H) 11/09/2021   Lab Results  Component Value  Date   NA 138 01/15/2022   K 4.4 01/15/2022   CO2 25 01/15/2022   GLUCOSE 258 (H) 01/15/2022   BUN 26 (H) 01/15/2022   CREATININE 1.33 (H) 01/15/2022   BILITOT 0.7 11/09/2021   ALKPHOS 105 11/09/2021   AST 18 11/09/2021   ALT 16 11/09/2021   PROT 5.8 (L) 11/09/2021   ALBUMIN 2.0 (L) 11/09/2021   CALCIUM 7.8 (L) 01/15/2022   ANIONGAP 6 01/15/2022   EGFR 52 (L) 06/12/2021   Lab Results  Component Value Date   CHOL 286 (H) 06/12/2021   Lab Results  Component Value Date   HDL 74 06/12/2021   Lab Results  Component Value Date   LDLCALC 171 (H) 06/12/2021   Lab Results  Component Value Date   TRIG 242 (H) 06/12/2021   Lab Results  Component Value Date   CHOLHDL 3.9 06/12/2021   Lab Results  Component Value Date   HGBA1C 10.5 (H) 11/09/2021      Assessment & Plan:   1. Type 2 diabetes mellitus with other kidney complication, unspecified whether long term insulin use ([place]): A1c is not well controlled.  It is too soon to recheck, however we will keep her Lantus the same in the morning at 10 units and increase the evening dose to 15 units.  Refill insulin today.  Follow-up in 1 month for A1c recheck.  - insulin glargine (LANTUS SOLOSTAR) 100 UNIT/ML Solostar Pen; Take 10 units in the morning and 15 in the evenings.  Dispense: 15 mL; Refill: PRN  2. Anemia, unspecified type: Patient states that she cannot afford her OCPs, however her menstrual cycles are much more tolerable.  We will recheck CBC, however if hemoglobin is still low she will require referral to hematology.  - CBC w/Diff/Platelet  3. HFrEF (heart failure with reduced ejection fraction) (HCC)/Syncope, unspecified syncope type: Heart failure worsening.  She is following with the heart failure clinic, note reviewed from 01/15/2022.  Medications reviewed.  Syncope most likely secondary to a combination of the patient being intravascularly dry due to worsening heart failure as well as anemia.  Discussed signs of  orthostatic hypotension, standing and walking slowly.  The patient does live with her mother and sister.  Reiterated importance of following fluid and sodium restriction.  4. Need for influenza vaccination: Flu vaccine administered today.  - Flu Vaccine QUAD 6+ mos PF IM (Fluarix Quad PF)  Follow-up: Return in about 4 weeks (around 03/04/2022).    [name], DO

## 2022-02-04 NOTE — Patient Instructions (Addendum)
It was great seeing you today!  Plan discussed at today's visit: -Blood work ordered today, results will be uploaded to MyChart.  -Keep Lantus at 10 units in the morning and increase to 15 units at night -Continue to monitor fluid and sodium intake  Follow up in: 1 month   Take care and let us know if you have any questions or concerns prior to your next visit.  Dr. Rosana Berger

## 2022-02-05 NOTE — Addendum Note (Signed)
Addended by: [name] on: 02/05/2022 10:22 AM   Modules accepted: Orders

## 2022-02-05 NOTE — Addendum Note (Signed)
Addended by: [name] on: 02/05/2022 10:14 AM   Modules accepted: Orders

## 2022-02-09 ENCOUNTER — Ambulatory Visit: Payer: Medicaid Other | Attending: Nurse Practitioner | Admitting: Nurse Practitioner

## 2022-02-09 ENCOUNTER — Other Ambulatory Visit: Payer: Self-pay

## 2022-02-09 ENCOUNTER — Inpatient Hospital Stay
Admission: EM | Admit: 2022-02-09 | Discharge: 2022-02-16 | DRG: 291 | Disposition: A | Discharge status: Home / Self Care | Payer: Medicaid Other | Source: Ambulatory Visit | Attending: Internal Medicine | Admitting: Internal Medicine

## 2022-02-09 ENCOUNTER — Emergency Department: Payer: Medicaid Other

## 2022-02-09 ENCOUNTER — Encounter: Payer: Self-pay | Admitting: Nurse Practitioner

## 2022-02-09 VITALS — BP 138/98 | HR 104 | Ht 67.0 in | Wt 263.0 lb

## 2022-02-09 DIAGNOSIS — I130 Hypertensive heart and chronic kidney disease with heart failure and stage 1 through stage 4 chronic kidney disease, or unspecified chronic kidney disease: Principal | ICD-10-CM | POA: Diagnosis present

## 2022-02-09 DIAGNOSIS — R55 Syncope and collapse: Secondary | ICD-10-CM

## 2022-02-09 DIAGNOSIS — R053 Chronic cough: Secondary | ICD-10-CM | POA: Diagnosis present

## 2022-02-09 DIAGNOSIS — I501 Left ventricular failure: Secondary | ICD-10-CM

## 2022-02-09 DIAGNOSIS — E119 Type 2 diabetes mellitus without complications: Secondary | ICD-10-CM

## 2022-02-09 DIAGNOSIS — I5021 Acute systolic (congestive) heart failure: Secondary | ICD-10-CM | POA: Diagnosis present

## 2022-02-09 DIAGNOSIS — Z9049 Acquired absence of other specified parts of digestive tract: Secondary | ICD-10-CM | POA: Diagnosis not present

## 2022-02-09 DIAGNOSIS — Z8249 Family history of ischemic heart disease and other diseases of the circulatory system: Secondary | ICD-10-CM

## 2022-02-09 DIAGNOSIS — I10 Essential (primary) hypertension: Secondary | ICD-10-CM | POA: Diagnosis present

## 2022-02-09 DIAGNOSIS — E8809 Other disorders of plasma-protein metabolism, not elsewhere classified: Secondary | ICD-10-CM | POA: Diagnosis present

## 2022-02-09 DIAGNOSIS — I5043 Acute on chronic combined systolic (congestive) and diastolic (congestive) heart failure: Secondary | ICD-10-CM | POA: Diagnosis present

## 2022-02-09 DIAGNOSIS — N182 Chronic kidney disease, stage 2 (mild): Secondary | ICD-10-CM | POA: Diagnosis present

## 2022-02-09 DIAGNOSIS — F1410 Cocaine abuse, uncomplicated: Secondary | ICD-10-CM | POA: Diagnosis present

## 2022-02-09 DIAGNOSIS — N840 Polyp of corpus uteri: Secondary | ICD-10-CM | POA: Diagnosis present

## 2022-02-09 DIAGNOSIS — D649 Anemia, unspecified: Secondary | ICD-10-CM | POA: Diagnosis not present

## 2022-02-09 DIAGNOSIS — E782 Mixed hyperlipidemia: Secondary | ICD-10-CM | POA: Diagnosis not present

## 2022-02-09 DIAGNOSIS — E1122 Type 2 diabetes mellitus with diabetic chronic kidney disease: Secondary | ICD-10-CM | POA: Diagnosis present

## 2022-02-09 DIAGNOSIS — Z794 Long term (current) use of insulin: Secondary | ICD-10-CM

## 2022-02-09 DIAGNOSIS — I428 Other cardiomyopathies: Secondary | ICD-10-CM

## 2022-02-09 DIAGNOSIS — I5023 Acute on chronic systolic (congestive) heart failure: Secondary | ICD-10-CM | POA: Diagnosis present

## 2022-02-09 DIAGNOSIS — Z87891 Personal history of nicotine dependence: Secondary | ICD-10-CM

## 2022-02-09 DIAGNOSIS — Z91119 Patient's noncompliance with dietary regimen due to unspecified reason: Secondary | ICD-10-CM | POA: Diagnosis not present

## 2022-02-09 DIAGNOSIS — E785 Hyperlipidemia, unspecified: Secondary | ICD-10-CM | POA: Diagnosis present

## 2022-02-09 DIAGNOSIS — Z91148 Patient's other noncompliance with medication regimen for other reason: Secondary | ICD-10-CM | POA: Diagnosis not present

## 2022-02-09 DIAGNOSIS — K219 Gastro-esophageal reflux disease without esophagitis: Secondary | ICD-10-CM | POA: Diagnosis present

## 2022-02-09 DIAGNOSIS — Z95810 Presence of automatic (implantable) cardiac defibrillator: Secondary | ICD-10-CM | POA: Diagnosis not present

## 2022-02-09 DIAGNOSIS — Z833 Family history of diabetes mellitus: Secondary | ICD-10-CM | POA: Diagnosis not present

## 2022-02-09 DIAGNOSIS — I472 Ventricular tachycardia, unspecified: Secondary | ICD-10-CM | POA: Diagnosis not present

## 2022-02-09 DIAGNOSIS — N920 Excessive and frequent menstruation with regular cycle: Secondary | ICD-10-CM

## 2022-02-09 DIAGNOSIS — Z6841 Body Mass Index (BMI) 40.0 and over, adult: Secondary | ICD-10-CM

## 2022-02-09 DIAGNOSIS — M79671 Pain in right foot: Secondary | ICD-10-CM | POA: Diagnosis not present

## 2022-02-09 DIAGNOSIS — D500 Iron deficiency anemia secondary to blood loss (chronic): Secondary | ICD-10-CM | POA: Diagnosis present

## 2022-02-09 DIAGNOSIS — R601 Generalized edema: Principal | ICD-10-CM

## 2022-02-09 DIAGNOSIS — J810 Acute pulmonary edema: Secondary | ICD-10-CM

## 2022-02-09 DIAGNOSIS — Z7984 Long term (current) use of oral hypoglycemic drugs: Secondary | ICD-10-CM

## 2022-02-09 DIAGNOSIS — E1159 Type 2 diabetes mellitus with other circulatory complications: Secondary | ICD-10-CM | POA: Diagnosis not present

## 2022-02-09 DIAGNOSIS — D509 Iron deficiency anemia, unspecified: Secondary | ICD-10-CM

## 2022-02-09 DIAGNOSIS — I420 Dilated cardiomyopathy: Secondary | ICD-10-CM | POA: Diagnosis present

## 2022-02-09 DIAGNOSIS — Z79899 Other long term (current) drug therapy: Secondary | ICD-10-CM | POA: Diagnosis not present

## 2022-02-09 LAB — CBC
HCT: 28.4 % — ABNORMAL LOW (ref 36.0–46.0)
Hemoglobin: 7.5 g/dL — ABNORMAL LOW (ref 12.0–15.0)
MCH: 19.4 pg — ABNORMAL LOW (ref 26.0–34.0)
MCHC: 26.4 g/dL — ABNORMAL LOW (ref 30.0–36.0)
MCV: 73.4 fL — ABNORMAL LOW (ref 80.0–100.0)
Platelets: 473 10*3/uL — ABNORMAL HIGH (ref 150–400)
RBC: 3.87 MIL/uL (ref 3.87–5.11)
RDW: 21.9 % — ABNORMAL HIGH (ref 11.5–15.5)
WBC: 5.7 10*3/uL (ref 4.0–10.5)
nRBC: 0.4 % — ABNORMAL HIGH (ref 0.0–0.2)

## 2022-02-09 LAB — BASIC METABOLIC PANEL
Anion gap: 6 (ref 5–15)
BUN: 24 mg/dL — ABNORMAL HIGH (ref 6–20)
CO2: 26 mmol/L (ref 22–32)
Calcium: 7.6 mg/dL — ABNORMAL LOW (ref 8.9–10.3)
Chloride: 105 mmol/L (ref 98–111)
Creatinine, Ser: 1.14 mg/dL — ABNORMAL HIGH (ref 0.44–1.00)
GFR, Estimated: >60 mL/min (ref >60)
Glucose, Bld: 184 mg/dL — ABNORMAL HIGH (ref 70–99)
Potassium: 4.5 mmol/L (ref 3.5–5.1)
Sodium: 137 mmol/L (ref 135–145)

## 2022-02-09 LAB — MAGNESIUM: Magnesium: 1.7 mg/dL (ref 1.7–2.4)

## 2022-02-09 LAB — IRON AND TIBC
Iron: 21 ug/dL — ABNORMAL LOW (ref 28–170)
Saturation Ratios: 6 % — ABNORMAL LOW (ref 10.4–31.8)
TIBC: 332 ug/dL (ref 250–450)
UIBC: 311 ug/dL

## 2022-02-09 LAB — CBG MONITORING, ED
Glucose-Capillary: 169 mg/dL — ABNORMAL HIGH (ref 70–99)
Glucose-Capillary: 132 mg/dL — ABNORMAL HIGH (ref 70–99)

## 2022-02-09 LAB — FERRITIN: Ferritin: 5 ng/mL — ABNORMAL LOW (ref 11–307)

## 2022-02-09 LAB — HEPATIC FUNCTION PANEL
ALT: 17 U/L (ref 0–44)
AST: 18 U/L (ref 15–41)
Albumin: 1.8 g/dL — ABNORMAL LOW (ref 3.5–5.0)
Alkaline Phosphatase: 120 U/L (ref 38–126)
Bilirubin, Direct: <0.1 mg/dL (ref 0.0–0.2)
Total Bilirubin: 0.7 mg/dL (ref 0.3–1.2)
Total Protein: 6 g/dL — ABNORMAL LOW (ref 6.5–8.1)

## 2022-02-09 LAB — BRAIN NATRIURETIC PEPTIDE: B Natriuretic Peptide: 859.3 pg/mL — ABNORMAL HIGH (ref 0.0–100.0)

## 2022-02-09 MED ORDER — ENOXAPARIN SODIUM 40 MG/0.4ML IJ SOSY: 40.0000 mg | PREFILLED_SYRINGE | INTRAMUSCULAR | Status: DC

## 2022-02-09 MED ORDER — GUAIFENESIN-DM 100-10 MG/5ML PO SYRP
15.0000 mL | ORAL_SOLUTION | ORAL | Status: DC | PRN
  Administered 2022-02-09 – 2022-02-12 (×4): 15 mL via ORAL
  Filled 2022-02-09 (×4): qty 20

## 2022-02-09 MED ORDER — SODIUM CHLORIDE 0.9% FLUSH
3.0000 mL | Freq: Two times a day (BID) | INTRAVENOUS | Status: DC
  Administered 2022-02-09 – 2022-02-16 (×14): 3 mL via INTRAVENOUS

## 2022-02-09 MED ORDER — SACUBITRIL-VALSARTAN 97-103 MG PO TABS
1.0000 | ORAL_TABLET | Freq: Two times a day (BID) | ORAL | Status: DC
  Administered 2022-02-09 – 2022-02-16 (×14): 1 via ORAL
  Filled 2022-02-09 (×14): qty 1

## 2022-02-09 MED ORDER — FUROSEMIDE 10 MG/ML IJ SOLN
80.0000 mg | Freq: Once | INTRAMUSCULAR | Status: AC
  Administered 2022-02-09: 80 mg via INTRAVENOUS
  Filled 2022-02-09: qty 8

## 2022-02-09 MED ORDER — MAGNESIUM SULFATE 2 GM/50ML IV SOLN
2.0000 g | Freq: Once | INTRAVENOUS | Status: AC
  Administered 2022-02-09: 2 g via INTRAVENOUS
  Filled 2022-02-09: qty 50

## 2022-02-09 MED ORDER — SODIUM CHLORIDE 0.9 % IV SOLN
510.0000 mg | Freq: Once | INTRAVENOUS | Status: AC
  Administered 2022-02-09: 510 mg via INTRAVENOUS
  Filled 2022-02-09: qty 17

## 2022-02-09 MED ORDER — DAPAGLIFLOZIN PROPANEDIOL 10 MG PO TABS
10.0000 mg | ORAL_TABLET | Freq: Every day | ORAL | Status: DC
  Administered 2022-02-10 – 2022-02-16 (×7): 10 mg via ORAL
  Filled 2022-02-09 (×7): qty 1

## 2022-02-09 MED ORDER — SODIUM CHLORIDE 0.9 % IV SOLN
250.0000 mL | INTRAVENOUS | Status: DC | PRN
  Administered 2022-02-13: 250 mL via INTRAVENOUS

## 2022-02-09 MED ORDER — INSULIN GLARGINE-YFGN 100 UNIT/ML ~~LOC~~ SOLN
10.0000 [IU] | Freq: Every day | SUBCUTANEOUS | Status: DC
  Administered 2022-02-09 – 2022-02-11 (×3): 10 [IU] via SUBCUTANEOUS
  Filled 2022-02-09 (×4): qty 0.1

## 2022-02-09 MED ORDER — FUROSEMIDE 10 MG/ML IJ SOLN
80.0000 mg | Freq: Two times a day (BID) | INTRAMUSCULAR | Status: DC
  Administered 2022-02-09 – 2022-02-11 (×4): 80 mg via INTRAVENOUS
  Filled 2022-02-09 (×4): qty 8

## 2022-02-09 MED ORDER — ONDANSETRON HCL 4 MG/2ML IJ SOLN
4.0000 mg | Freq: Once | INTRAMUSCULAR | Status: AC
  Administered 2022-02-09: 4 mg via INTRAVENOUS
  Filled 2022-02-09: qty 2

## 2022-02-09 MED ORDER — FAMOTIDINE 20 MG PO TABS
20.0000 mg | ORAL_TABLET | Freq: Two times a day (BID) | ORAL | Status: DC
  Administered 2022-02-09 – 2022-02-16 (×14): 20 mg via ORAL
  Filled 2022-02-09 (×14): qty 1

## 2022-02-09 MED ORDER — ENOXAPARIN SODIUM 60 MG/0.6ML IJ SOSY
0.5000 mg/kg | PREFILLED_SYRINGE | INTRAMUSCULAR | Status: DC
  Administered 2022-02-09 – 2022-02-15 (×7): 60 mg via SUBCUTANEOUS
  Filled 2022-02-09 (×7): qty 0.6

## 2022-02-09 MED ORDER — ATORVASTATIN CALCIUM 20 MG PO TABS
20.0000 mg | ORAL_TABLET | Freq: Every day | ORAL | Status: DC
  Administered 2022-02-10 – 2022-02-16 (×7): 20 mg via ORAL
  Filled 2022-02-09 (×7): qty 1

## 2022-02-09 MED ORDER — INSULIN ASPART 100 UNIT/ML IJ SOLN
0.0000 [IU] | Freq: Three times a day (TID) | INTRAMUSCULAR | Status: DC
  Administered 2022-02-09: 4 [IU] via SUBCUTANEOUS
  Administered 2022-02-10 – 2022-02-11 (×2): 3 [IU] via SUBCUTANEOUS
  Administered 2022-02-11: 4 [IU] via SUBCUTANEOUS
  Administered 2022-02-12 – 2022-02-13 (×4): 3 [IU] via SUBCUTANEOUS
  Administered 2022-02-13: 7 [IU] via SUBCUTANEOUS
  Administered 2022-02-14 (×3): 4 [IU] via SUBCUTANEOUS
  Administered 2022-02-15: 7 [IU] via SUBCUTANEOUS
  Administered 2022-02-15: 3 [IU] via SUBCUTANEOUS
  Administered 2022-02-15: 7 [IU] via SUBCUTANEOUS
  Administered 2022-02-16: 3 [IU] via SUBCUTANEOUS
  Filled 2022-02-09 (×16): qty 1

## 2022-02-09 MED ORDER — SPIRONOLACTONE 25 MG PO TABS
25.0000 mg | ORAL_TABLET | Freq: Every day | ORAL | Status: DC
  Administered 2022-02-10 – 2022-02-16 (×7): 25 mg via ORAL
  Filled 2022-02-09 (×7): qty 1

## 2022-02-09 MED ORDER — CARVEDILOL 6.25 MG PO TABS
6.2500 mg | ORAL_TABLET | Freq: Two times a day (BID) | ORAL | Status: DC
  Administered 2022-02-09 – 2022-02-16 (×14): 6.25 mg via ORAL
  Filled 2022-02-09 (×14): qty 1

## 2022-02-09 MED ORDER — SODIUM CHLORIDE 0.9% FLUSH: 3.0000 mL | INTRAVENOUS | Status: DC | PRN

## 2022-02-09 MED ORDER — ACETAMINOPHEN 325 MG PO TABS
650.0000 mg | ORAL_TABLET | ORAL | Status: DC | PRN
  Administered 2022-02-13 – 2022-02-15 (×4): 650 mg via ORAL
  Filled 2022-02-09 (×3): qty 2

## 2022-02-09 MED ORDER — ONDANSETRON HCL 4 MG/2ML IJ SOLN: 4.0000 mg | Freq: Four times a day (QID) | INTRAMUSCULAR | Status: DC | PRN

## 2022-02-09 NOTE — Progress Notes (Signed)
Office Visit    Patient Name: Molly Howard Date of Encounter: 02/09/2022  Primary Care Provider:  [name], DO Primary Cardiologist:  [name], MD  Chief Complaint    36 year old female with a history of HFrEF, nonischemic cardiomyopathy, hypertension, type 2 diabetes mellitus, stage II chronic kidney disease, GERD, microcytic anemia, obesity, medication nonadherence, and polysubstance abuse, presents for follow-up related to heart failure.  Past Medical History    Past Medical History:  Diagnosis Date   Acid reflux    Chronic HFrEF (heart failure with reduced ejection fraction) ([place])    a. 10/2019 Echo: EF 35-40%, GrII DD; b. 05/2020 Echo: EF 20-25%, glob HK; c. 10/2020 Echo: EF 30-35%, glob HK. GrII DD, Mildly red RV fxn. Mod TR; d.  05/2021 cMRI: EF 42%, no LGE. Nl RV size/fxn.   CKD (chronic kidney disease), stage II    Diabetes mellitus ([place])    H/O medication noncompliance    Hypertension    Microcytic anemia    NICM (nonischemic cardiomyopathy) ([place])    a. 10/2019 Echo: EF 35-40%; b. 10/2019 MV: No ischemia. Small apical defect-->breast attenuation; c. 05/2020 Echo: EF 20-25%; d. 10/2020 Echo: EF 30-35%, glob HK. GrII DD, Mildly red RV fxn. Mod TR; e. 05/2021 cMRI: EF 42%, no LGE. Nl RV size/fxn.   Obesity    Polysubstance abuse ([place])    Past Surgical History:  Procedure Laterality Date   CHOLECYSTECTOMY     HERNIA REPAIR      Allergies  Allergies  Allergen Reactions   No [employer] Food Allergies Rash and Other (See Comments)    Lemons    History of Present Illness    36 year old female with the above past medical history including HFrEF, nonischemic cardiomyopathy, hypertension, diabetes, stage II chronic kidney disease, GERD, microcytic anemia, obesity, medication nonadherence, and polysubstance abuse.  She was initially diagnosed with heart failure when she presented in June 2021, with dyspnea and volume overload.  Echocardiogram at that time  showed an EF of 35 to 40% with grade 2 diastolic dysfunction.  This was followed by stress testing, which was nonischemic, and she was treated for nonischemic cardiomyopathy.  Unfortunately, she struggled affording her medications.  In January 2022, she was admitted with COVID-19 and found to have an EF of 20-25%.  Following this, she was followed closely in cardiology and heart failure clinic, and placed on GDMT.  She was readmitted in June 2022 in the setting of cocaine use.  Echo during that admission showed an EF of 30 to 35% with global hypokinesis, grade 2 diastolic dysfunction, mildly reduced RV function, and moderate TR.  Following discharge, she did not pick up any of her medications required readmission July 2022 due to marked volume overload.  In the setting of ongoing LV dysfunction despite maximally tolerated GDMT, she was referred to electrophysiology.  Cardiac MRI was performed and showed an EF of 42% with no LGE, and normal RV function.  In this setting, ICD therapy was not indicated.  She was referred to genetic counseling in the setting of her mother also having had a cardiomyopathy.  A follow-up limited echo in May 2023 showed an EF of 25 to 30%, and she was referred back to electrophysiology.  June 7 at EP visit, Molly Howard reported an episode of syncope that occurred while standing.  Decision was made to pursue placement of a subcutaneous AICD however, lab work obtained in clinic that day showed a hemoglobin of 8.1 with hematocrit  of 27.1.  This was down from baseline.  ICD placement was canceled.  She was advised to follow-up with her primary care provider related to menorrhagia.  It was noted by primary care provider that patient had previously seen GYN with diagnosis of endometrial polyp and was given oral contraceptives but had not started.  And primary care follow-up on October 5, patient reported that she still had not started oral contraceptives however, menstrual cycles had been more  manageable.  She remained anemic at 7.5/27.7 by lab work on October 5, and she was referred to hematology.  Unfortunately, over the course of this year, Molly Howard has had progressive weight gain and volume excess despite reported compliance with her medications and avoidance of illicit substances.  Her weight was previously as low as 186 pounds in April 2023, and today, she is 263 pounds.  She is up 25 pounds just since September 18 according to historic weights in epic.  She notes that with weight gain, she has had progressive dyspnea on exertion, increasing abdominal girth, and massive lower extremity edema.  She does not think that she has had any change in her dietary or salt intake.  She has had 1 episode of syncope since her last EP visit, occurring about a month ago.  She says that she stood up and that her mother witnessed her suddenly lose consciousness and fall to the floor.  She apparently regained consciousness quickly and had no sequelae.  She has no recollection of falling.  She occasionally notes a brief flutter or palpitation as well as occasional sharp and fleeting chest discomfort.  +++ PND, orthopnea, and early satiety.  Home Medications    Prior to Admission medications   Medication Sig Start Date End Date Taking? Authorizing Provider  acetaminophen (TYLENOL) 500 MG tablet Take 1,000 mg by mouth every 6 (six) hours as needed (for pain.).   Yes [provider]  atorvastatin (LIPITOR) 20 MG tablet TAKE 1 TABLET BY MOUTH DAILY 12/14/21  Yes Darylene Price A, FNP  blood glucose meter kit and supplies KIT Dispense based on patient and insurance preference. Use up to four times daily as directed. (FOR ICD-9 250.00, 250.01). 12/08/20  Yes Max Sane, MD  carvedilol (COREG) 6.25 MG tablet Take 1 tablet (6.25 mg total) by mouth 2 (two) times daily with a meal. 12/11/20  Yes Darylene Price A, FNP  dapagliflozin propanediol (FARXIGA) 10 MG TABS tablet Take 1 tablet (10 mg total) by mouth  once daily. 11/05/21  Yes Darylene Price A, FNP  famotidine (PEPCID) 20 MG tablet Take 1 tablet (20 mg total) by mouth 2 (two) times daily. 06/12/21  Yes [name], DO  insulin glargine (LANTUS SOLOSTAR) 100 UNIT/ML Solostar Pen Take 10 units in the morning and 15 in the evenings. 02/04/22  Yes [name], DO  Insulin Pen Needle (PEN NEEDLES 3/16") 31G X 5 MM MISC 1 each by Does not apply route 2 (two) times daily. 07/16/21  Yes [name], DO  potassium chloride SA (KLOR-CON M) 20 MEQ tablet Take 0.5 tablets (10 mEq total) by mouth daily. 04/29/21  Yes Hackney, Tina A, FNP  sacubitril-valsartan (ENTRESTO) 97-103 MG Take 1 tablet by mouth 2 (two) times daily. 07/02/21  Yes [name], MD  spironolactone (ALDACTONE) 25 MG tablet TAKE 1 TABLET BY MOUTH DAILY 12/14/21  Yes Darylene Price A, FNP  torsemide (DEMADEX) 20 MG tablet Take 60 mg by mouth daily. 03/31/21  Yes [provider]  norethindrone (AYGESTIN) 5  MG tablet Take 1 tablet (5 mg total) by mouth daily. Patient not taking: Reported on 01/15/2022 2/83/66   Copland, Deirdre Evener, PA-C  Potassium Chloride 40 MEQ/15ML (20%) SOLN Take 40 mEq by mouth 2 (two) times daily. 07/04/20 10/18/20  [name], MD      Family History     Family History  Problem Relation Age of Onset   Heart failure Mother        a. onset in her 30s   Diabetes Mother    Hypertension Father    Diabetes Father     Social History    Social History   Socioeconomic History   Marital status: Single    Spouse name: Not on file   Number of children: Not on file   Years of education: Not on file   Highest education level: Not on file  Occupational History   Not on file  Tobacco Use   Smoking status: Former    Packs/day: 0.50    Types: Cigarettes    Quit date: 2022    Years since quitting: 1.7   Smokeless tobacco: Never  Vaping Use   Vaping Use: Never used  Substance and Sexual Activity   Alcohol use: Not Currently     Comment: ~ 4 shots/day on weekends only   Drug use: Not Currently    Types: Cocaine    Comment: Last day of use was prior to June 2021 hosp.visit.    Sexual activity: Not Currently    Birth control/protection: None  Other Topics Concern   Not on file  Social History Narrative   Not on file   Social Determinants of Health   Financial Resource Strain: High Risk (06/16/2020)   Overall Financial Resource Strain (CARDIA)    Difficulty of Paying Living Expenses: Hard  Food Insecurity: Food Insecurity Present (06/16/2020)   Hunger Vital Sign    Worried About Charity fundraiser in the Last Year: Often true    Ran Out of Food in the Last Year: Often true  Transportation Needs: Unmet Transportation Needs (06/16/2020)   PRAPARE - Hydrologist (Medical): Yes    Lack of Transportation (Non-Medical): Yes  Physical Activity: Not on file  Stress: Not on file  Social Connections: Not on file  Intimate Partner Violence: Not on file    Review of Systems    + Occasional fleeting chest pain. + Occasional brief palpitation. + Weight gain, increasing abdominal girth, dyspnea, PND, orthopnea, early satiety, lower extremity edema All other systems reviewed and are otherwise negative except as noted above.    Physical Exam    VS:  BP (!) 138/98 (BP Location: Left Arm, Patient Position: Sitting, Cuff Size: Normal)   Pulse (!) 104   Ht '5\' 7"'  (1.702 m)   Wt 263 lb (119.3 kg)   SpO2 100%   BMI 41.19 kg/m  , BMI Body mass index is 41.19 kg/m.      GEN: Obese, in no acute distress. HEENT: normal. Neck: Supple, JVD to jaw.  No bruits or masses.   Cardiac: RRR, tachycardic, +S3/S4. No murmurs.  No clubbing, cyanosis.  3+ bilat LE edema to hips.  Radials/PT 2+ and equal bilaterally.  Respiratory:  Respirations regular and unlabored, clear to auscultation bilaterally. GI: Obese, firm, protuberant, nontender BS + x 4.  1+ bilateral flank and lower back edema. MS: no  deformity or atrophy. Skin: warm and dry, no rash. Neuro:  Strength and sensation are intact.  Psych: Normal affect.  Accessory Clinical Findings    ECG personally reviewed by me today -sinus tachycardia, 104, left atrial enlargement, delayed R wave progression- no acute changes.  Lab Results  Component Value Date   WBC 6.3 02/04/2022   HGB 7.5 (L) 02/04/2022   HCT 27.7 (L) 02/04/2022   MCV 71.9 (L) 02/04/2022   PLT 443 (H) 02/04/2022   Lab Results  Component Value Date   CREATININE 1.33 (H) 01/15/2022   BUN 26 (H) 01/15/2022   NA 138 01/15/2022   K 4.4 01/15/2022   CL 107 01/15/2022   CO2 25 01/15/2022   Lab Results  Component Value Date   ALT 16 11/09/2021   AST 18 11/09/2021   ALKPHOS 105 11/09/2021   BILITOT 0.7 11/09/2021   Lab Results  Component Value Date   CHOL 286 (H) 06/12/2021   HDL 74 06/12/2021   LDLCALC 171 (H) 06/12/2021   TRIG 242 (H) 06/12/2021   CHOLHDL 3.9 06/12/2021    Lab Results  Component Value Date   HGBA1C 10.5 (H) 11/09/2021    Assessment & Plan    1.  Acute on chronic heart failure with reduced ejection fraction/nonischemic cardiomyopathy: EF of 25 to 30% by echo in May 2023, with grade 2 diastolic dysfunction.  Since then, patient has had progressive weight gain and is now up to 263 pounds, 25 pounds above where she was just 3 weeks ago, and nearly 80 pounds higher than what she was earlier this year.  She has noted significant lower extremity swelling and increasing abdominal girth with associated PND, orthopnea, dyspnea, and early satiety.  She is massively volume overloaded on examination.  Given her complex clinical picture which also includes chronic menorrhagia with microcytic anemia and recent H&H of 7.5/27.7, I think she will be best served with hospital admission, IV diuresis, and possibly blood transfusion.  She was in agreement with this plan and was wheeled to the ED today.   Provided that renal function is stable, she should  remain on beta-blocker, Entresto, spironolactone, and dapagliflozin therapy.  She has previously been considered for subcutaneous AICD however, placement has been on hold since mid summer secondary to ongoing anemia.  2.  Essential hypertension: Blood pressure elevated today in the setting of massive volume overload.  Continue beta-blocker, Entresto, and spironolactone.  Follow with diuresis while hospitalized.  3.  Type 2 diabetes mellitus: A1c 10.5 earlier this year.  She takes Lantus and Farxiga at home.  Management per internal medicine team during pending hospitalization.  4.  Stage II chronic kidney disease: Followed closely with diuresis.  Continue Entresto and [place].  5.  Menorrhagia/microcytic anemia: Ongoing issue.  Previously seen by GYN and prescribed oral contraceptives however, patient had not been taking and thought that her periods were more manageable/lighter.  Unfortunately, she remains anemic with an H&H of 7.5 and 27.7 October 5.  Low threshold for transfusion during pending hospitalization.  6.  Syncope: Patient with prior syncopal spell in addition to low EF, previously prompting discussion around subcutaneous AICD.  She notes a recurrent episode of syncope occurring about a month ago, which was witnessed by her mother.  High risk for ventricular arrhythmias.  Can reconsider EP evaluation following aggressive diuresis and stabilization of anemia.  7.  Medication nonadherence: Patient says that she has been compliant with all medications over the course of this year.  8.  Polysubstance use: Patient denies using any illicit substances dating back to hospitalization last year.  9.  Disposition: Patient to present to the emergency department for evaluation and admission.  We will arrange for follow-up here in approximately 2 weeks.   Murray Hodgkins, NP 02/09/2022, 11:38 AM

## 2022-02-09 NOTE — Assessment & Plan Note (Signed)
Patient has a long-term history of chronic anemia secondary to iron deficiency in the setting of chronic blood loss secondary to menorrhagia.  Hemoglobin today is low at 7.5.  Iron panel demonstrates severe iron deficiency.  We will start with IV iron supplementation with possible additional blood transfusion if needed during hospitalization.  - Monitor CBC daily - Feraheme ordered

## 2022-02-09 NOTE — ED Notes (Signed)
Patient AOX4. Resp even, unlabored on RA. 3+ pitting edema to BLE. Placed on cardiac monitor and pulse oximetry. NSR on monitor at 90's. Patient reports she needs all monitoring equipment off so she can get up and down to the toilet. Ambulatory with walker. No distress noted at this time.

## 2022-02-09 NOTE — ED Notes (Addendum)
EDT asked to place Pure wick placed due to anticipation of giving lasix.

## 2022-02-09 NOTE — ED Notes (Signed)
Purewick leaked pt asked to use bedside toilet, fall prevention socks given to pt, stretcher moved next to bedside toilet so pt can turn and pivot to bedside toilet.   Soiled clothes placed in belongings bag, new gown given and new chux placed on bed. Call bell in reeach.   Hat placed in toilet in hopes in measure output.

## 2022-02-09 NOTE — H&P (Signed)
History and Physical    Patient: Molly Howard NTI:[id number] DOB: [birth year] DOA: 02/09/2022 DOS: the patient was seen and examined on 02/09/2022 PCP: [name], DO  Patient coming from: Home  Chief Complaint:  Chief Complaint  Patient presents with   Leg Swelling   HPI: Molly Howard is a 36 y.o. female with medical history significant of HFrEF with most recent EF 25-30% secondary to nonischemic cardiomyopathy, type 2 diabetes, hypertension, hyperlipidemia, CKD stage II, chronic anemia who presents to the ED from her cardiologist office for acute heart failure.  [name] states she has been taking all of her guideline directed medical therapy as prescribed in addition to 3 tablets of torsemide daily as instructed by her cardiologist.  Despite this, she has gained approximately 25 pounds in the last month.  She is experiencing shortness of breath with intermittent substernal chest pain and occasional palpitations.  She states that occasionally she does not lose consciousness upon standing with the most recent episode occurring approximately 1 month ago.  She has followed up with her cardiologist regarding this who is working towards getting an ICD placed.  [name] states that she has not yet started her OCP and continues to have heavy menorrhagia.  She is never taken IV iron but is unable to tolerate p.o. iron.  She denies any recent illness including fever, chills, congestion.  She notes a chronic cough that seems to have worsened since she has developed increased weight.  On arrival to the ED, patient was hypertensive at 138/98 with a heart rate of 103.  She was saturating at 95% on room air.  BNP elevated at 859.  Cardiology consulted by ED provider.  TRH contacted for admission.  Review of Systems: As mentioned in the history of present illness. All other systems reviewed and are negative.  Past Medical History:  Diagnosis Date   Acid reflux    Chronic HFrEF (heart  failure with reduced ejection fraction) ([place])    a. 10/2019 Echo: EF 35-40%, GrII DD; b. 05/2020 Echo: EF 20-25%, glob HK; c. 10/2020 Echo: EF 30-35%, glob HK. GrII DD, Mildly red RV fxn. Mod TR; d.  05/2021 cMRI: EF 42%, no LGE. Nl RV size/fxn.   CKD (chronic kidney disease), stage II    Diabetes mellitus ([place])    H/O medication noncompliance    Hypertension    Microcytic anemia    NICM (nonischemic cardiomyopathy) ([place])    a. 10/2019 Echo: EF 35-40%; b. 10/2019 MV: No ischemia. Small apical defect-->breast attenuation; c. 05/2020 Echo: EF 20-25%; d. 10/2020 Echo: EF 30-35%, glob HK. GrII DD, Mildly red RV fxn. Mod TR; e. 05/2021 cMRI: EF 42%, no LGE. Nl RV size/fxn.   Obesity    Polysubstance abuse ([place])    Past Surgical History:  Procedure Laterality Date   CHOLECYSTECTOMY     HERNIA REPAIR     Social History:  reports that she quit smoking about 21 months ago. Her smoking use included cigarettes. She smoked an average of .5 packs per day. She has never used smokeless tobacco. She reports that she does not currently use alcohol. She reports that she does not currently use drugs after having used the following drugs: Cocaine.  Allergies  Allergen Reactions   No [employer] Food Allergies Rash and Other (See Comments)    Lemons    Family History  Problem Relation Age of Onset   Heart failure Mother        a. onset in her  68s   Diabetes Mother    Hypertension Father    Diabetes Father     Prior to Admission medications   Medication Sig Start Date End Date Taking? Authorizing Provider  atorvastatin (LIPITOR) 20 MG tablet TAKE 1 TABLET BY MOUTH DAILY 12/14/21  Yes Darylene Price A, FNP  carvedilol (COREG) 6.25 MG tablet Take 1 tablet (6.25 mg total) by mouth 2 (two) times daily with a meal. 12/11/20  Yes Darylene Price A, FNP  dapagliflozin propanediol (FARXIGA) 10 MG TABS tablet Take 1 tablet (10 mg total) by mouth once daily. 11/05/21  Yes Darylene Price A, FNP  famotidine (PEPCID) 20 MG  tablet Take 1 tablet (20 mg total) by mouth 2 (two) times daily. 06/12/21  Yes [name], DO  insulin glargine (LANTUS SOLOSTAR) 100 UNIT/ML Solostar Pen Take 10 units in the morning and 15 in the evenings. 02/04/22  Yes [name], DO  norethindrone (AYGESTIN) 5 MG tablet Take 1 tablet (5 mg total) by mouth daily. [id number]  Yes Copland, Alicia B, PA-C  potassium chloride SA (KLOR-CON M) 20 MEQ tablet Take 0.5 tablets (10 mEq total) by mouth daily. 04/29/21  Yes Hackney, Tina A, FNP  sacubitril-valsartan (ENTRESTO) 97-103 MG Take 1 tablet by mouth 2 (two) times daily. 07/02/21  Yes [name], MD  spironolactone (ALDACTONE) 25 MG tablet TAKE 1 TABLET BY MOUTH DAILY 12/14/21  Yes Darylene Price A, FNP  torsemide (DEMADEX) 20 MG tablet Take 60 mg by mouth daily. 03/31/21  Yes [provider]  acetaminophen (TYLENOL) 500 MG tablet Take 1,000 mg by mouth every 6 (six) hours as needed (for pain.).    [provider]  Potassium Chloride 40 MEQ/15ML (20%) SOLN Take 40 mEq by mouth 2 (two) times daily. 07/04/20 10/18/20  [name], MD    Physical Exam: Vitals:   02/09/22 1210 02/09/22 1211 02/09/22 1400 02/09/22 1500  BP: (!) 140/103  (!) 133/100 (!) 135/96  Pulse: (!) 103  97 100  Resp: 18  16 18   Temp: 97.7 F (36.5 C)     TempSrc: Oral     SpO2:  95% 100% 99%  Weight: 119.3 kg     Height: 5\' 7"  (1.702 m)      Physical Exam Vitals and nursing note reviewed.  Constitutional:      General: She is not in acute distress.    Appearance: She is obese. She is not toxic-appearing.  HENT:     Head: Normocephalic and atraumatic.     Mouth/Throat:     Mouth: Mucous membranes are moist.     Pharynx: Oropharynx is clear.  Eyes:     Conjunctiva/sclera: Conjunctivae normal.     Pupils: Pupils are equal, round, and reactive to light.  Neck:     Comments: Unable to assess JVD secondary to body habitus Cardiovascular:     Rate and Rhythm: Regular rhythm.  Tachycardia present.     Heart sounds: No murmur heard.    Comments: 3+ pitting edema extending bilateral lower extremities into the sacral region Pulmonary:     Effort: Pulmonary effort is normal. No respiratory distress.     Breath sounds: Normal breath sounds. No wheezing or rales.  Abdominal:     General: Bowel sounds are normal. There is no distension.     Palpations: Abdomen is soft.     Tenderness: There is no abdominal tenderness. There is no guarding.  Skin:    General: Skin is warm and dry.  Neurological:  General: No focal deficit present.     Mental Status: She is alert and oriented to person, place, and time. Mental status is at baseline.  Psychiatric:        Mood and Affect: Mood normal.        Behavior: Behavior normal.        Thought Content: Thought content normal.        Judgment: Judgment normal.    Data Reviewed: CBC remarkable for hemoglobin of 7.5 with MCV of 73 and platelets of 473.  BMP remarkable for glucose of 184, creatinine of 1.14, calcium of 7.6.  Hepatic function panel remarkable for protein of 6.0 and albumin of 1.8.  BNP elevated 859.  Magnesium within normal limits at 1.7.  Iron panel demonstrates iron saturation of 6% with ferritin of 5.  Chest x-ray personally reviewed demonstrating mild interstitial edema with no evidence of pleural effusions.  Results are pending, will review when available.  Assessment and Plan: * Acute on chronic HFrEF (heart failure with reduced ejection fraction) ([place]) Patient presenting with subacute onset heart failure with approximately 25 pound weight gain over the last 4 weeks despite increased dose of torsemide.  She was sent to the ED from her cardiologist office.  We will start with IV diuresis and continue home guideline directed medical therapy.  No indication at this time to repeat echocardiogram.  - Cardiology following; appreciate their recommendations - IV Lasix 80 mg twice daily - Continue GDMT including  carvedilol, Farxiga, spironolactone, Entresto, - Strict in and out - Daily weights - Monitor potassium and magnesium daily - We will replete magnesium today with IV mag sulfate 2 g  Chronic anemia Patient has a long-term history of chronic anemia secondary to iron deficiency in the setting of chronic blood loss secondary to menorrhagia.  Hemoglobin today is low at 7.5.  Iron panel demonstrates severe iron deficiency.  We will start with IV iron supplementation with possible additional blood transfusion if needed during hospitalization.  - Monitor CBC daily - Feraheme ordered  Essential hypertension Blood pressure above goal at this time at 138/98.  We will continue home medications.  -Continue home carvedilol, Entresto, spironolactone  Diabetes mellitus, type II (HCC) Last A1c approximately 3 months ago elevated at 10.5%.  We will repeat A1c while admitted.  - SSI, resistant - Lantus 10 units at bedtime - A1c pending  HLD (hyperlipidemia) - Continue home atorvastatin  Chronic kidney disease (CKD), stage II (mild) Renal function at baseline at this time  - Monitor BMP daily while IV diuresing  Syncope Patient has a history of syncope with the most recent episode approximately 1 month ago.  This is being monitored by her cardiologist but will admit to a telemetry unit while admitted as she is high risk for ventricular arrhythmia.  AICD placement was intended, but delayed due to anemia.  -Telemetry monitoring   Advance Care Planning:   Code Status: Full Code   Consults: Cardiology  Family Communication: No family at bedside  Severity of Illness: The appropriate patient status for this patient is INPATIENT. Inpatient status is judged to be reasonable and necessary in order to provide the required intensity of service to ensure the patient's safety. The patient's presenting symptoms, physical exam findings, and initial radiographic and laboratory data in the context of their  chronic comorbidities is felt to place them at high risk for further clinical deterioration. Furthermore, it is not anticipated that the patient will be medically stable for discharge from the  hospital within 2 midnights of admission.   * I certify that at the point of admission it is my clinical judgment that the patient will require inpatient hospital care spanning beyond 2 midnights from the point of admission due to high intensity of service, high risk for further deterioration and high frequency of surveillance required.*  Author: Jose Persia, MD 02/09/2022 4:59 PM  For on call review [email or web address].

## 2022-02-09 NOTE — Assessment & Plan Note (Signed)
Last A1c approximately 3 months ago elevated at 10.5%.  We will repeat A1c while admitted.  - SSI, resistant - Lantus 10 units at bedtime - A1c pending

## 2022-02-09 NOTE — Assessment & Plan Note (Signed)
Patient presenting with subacute onset heart failure with approximately 25 pound weight gain over the last 4 weeks despite increased dose of torsemide.  She was sent to the ED from her cardiologist office.  We will start with IV diuresis and continue home guideline directed medical therapy.  No indication at this time to repeat echocardiogram.  - Cardiology following; appreciate their recommendations - IV Lasix 80 mg twice daily - Continue GDMT including carvedilol, Farxiga, spironolactone, Entresto, - Strict in and out - Daily weights - Monitor potassium and magnesium daily - We will replete magnesium today with IV mag sulfate 2 g

## 2022-02-09 NOTE — Assessment & Plan Note (Signed)
Blood pressure above goal at this time at 138/98.  We will continue home medications.  -Continue home carvedilol, Entresto, spironolactone

## 2022-02-09 NOTE — Progress Notes (Signed)
PHARMACIST - PHYSICIAN COMMUNICATION  CONCERNING:  Enoxaparin (Lovenox) for DVT Prophylaxis    RECOMMENDATION: Patient was prescribed enoxaprin 40mg  q24 hours for VTE prophylaxis.   Filed Weights   02/09/22 1210  Weight: 119.3 kg (263 lb)    Body mass index is 41.19 kg/m.  Estimated Creatinine Clearance: 90.3 mL/min (A) (by C-G formula based on SCr of 1.14 mg/dL (H)).   Based on [place] patient is candidate for enoxaparin 0.5mg /kg TBW SQ every 24 hours based on BMI being >30.  DESCRIPTION: Pharmacy has adjusted enoxaparin dose per [facility] policy.  Patient is now receiving enoxaparin 60 mg every 24 hours    Molly Howard, PharmD Clinical Pharmacist  02/09/2022 2:36 PM

## 2022-02-09 NOTE — ED Provider Notes (Signed)
[facility] Provider Note    Event Date/Time   First MD Initiated Contact with Patient 02/09/22 1237     (approximate)   History   Leg Swelling   HPI  Molly Howard is a 36 y.o. female on review of cardiology note from today has a history of heart failure with reduced EF, cardiomyopathy hypertension type 2 diabetes chronic kidney disease anemia obesity polysubstance abuse   On review of cardiology note denotes the patient was sent to the emergency department for evaluation and admission  For the last 2 weeks patient has been noticing quite a bit of increased swelling in both legs as well as in her abdomen.  She reports this has happened before when she has been full of fluid.  She has slight shortness of breath.  More noticeable when she walks  No fevers or chills.  No chest pain.  She has been using her medications at home, she has gained approximately 50 pounds in the last couple months and 25 since the middle of September  She saw her cardiologist today and was advised to come to the ER so we can start to work to get fluid off of her  Physical Exam   Triage Vital Signs: ED Triage Vitals  Enc Vitals Group     BP 02/09/22 1210 (!) 140/103     Pulse Rate 02/09/22 1210 (!) 103     Resp 02/09/22 1210 18     Temp 02/09/22 1210 97.7 F (36.5 C)     Temp Source 02/09/22 1210 Oral     SpO2 02/09/22 1211 95 %     Weight 02/09/22 1210 263 lb (119.3 kg)     Height 02/09/22 1210 5\' 7"  (1.702 m)     Head Circumference --      Peak Flow --      Pain Score 02/09/22 1210 6     Pain Loc --      Pain Edu? --      Excl. in [place]? --     Most recent vital signs: Vitals:   02/09/22 1210 02/09/22 1211  BP: (!) 140/103   Pulse: (!) 103   Resp: 18   Temp: 97.7 F (36.5 C)   SpO2:  95%     General: Awake, no distress.  Very pleasant.  Resting in recliner with head up. CV:  Good peripheral perfusion.  Slight tachycardia.  No murmurs or rubs are  noted. Resp:  Normal effort.  Speaks with normal respiratory pattern.  No noted crackles or rales on auscultation.  No noted increased work of breathing at this time Abd:  No distention.  Positive fluid wave, nontender.  Exam consistent with likely ascites it is distended but nontender throughout. Other:  Massive lower extremity edema pitting edema to the level above the knees all the way to the level of lower abdomen.  Consistent with anasarca.  It is bilateral and equal   ED Results / Procedures / Treatments   Labs (all labs ordered are listed, but only abnormal results are displayed) Labs Reviewed  BASIC METABOLIC PANEL - Abnormal; Notable for the following components:      Result Value   Glucose, Bld 184 (*)    BUN 24 (*)    Creatinine, Ser 1.14 (*)    Calcium 7.6 (*)    All other components within normal limits  BRAIN NATRIURETIC PEPTIDE - Abnormal; Notable for the following components:   B Natriuretic Peptide 859.3 (*)  All other components within normal limits  CBC - Abnormal; Notable for the following components:   Hemoglobin 7.5 (*)    HCT 28.4 (*)    MCV 73.4 (*)    MCH 19.4 (*)    MCHC 26.4 (*)    RDW 21.9 (*)    Platelets 473 (*)    nRBC 0.4 (*)    All other components within normal limits  MAGNESIUM  HEPATIC FUNCTION PANEL  POC URINE PREG, ED     EKG  And interpreted by me at 1215 heart rate 100 QRS 70 QTc 400 Sinus tachycardia, nonspecific T wave abnormality.  No evidence of acute ischemia denoted   RADIOLOGY  Chest x-ray interpreted by me as cardiomegaly and pulmonary edema     PROCEDURES:  Critical Care performed: No  Procedures   MEDICATIONS ORDERED IN ED: Medications  furosemide (LASIX) injection 80 mg (has no administration in time range)     IMPRESSION / MDM / ASSESSMENT AND PLAN / ED COURSE  I reviewed the triage vital signs and the nursing notes.                              Differential diagnosis includes, but is not limited  to, anasarca likely secondary to congestive heart failure but also other causes considered such as hypoalbuminemia, medication effect, hepatic, etc.  Thankfully she is not hypoxic or having notable increased work of breathing though she does have mild tachycardia and notable edema.  Differential diagnosis also includes other causes such as pericardial effusion no this is not present with tension-like physiology, and at this time I do not have a clear cause to demonstrate that etiology but anticipate she will need further work-up as an inpatient.  I discussed with her cardiology team including Francee Piccolo, NP who referred her here and he advises to initiate 80 mg of Lasix IV admission and cardiology to consult.  No infectious symptoms.  No chest pain.  No evidence to support ACS as etiology.  Patient's presentation is most consistent with acute presentation with potential threat to life or bodily function.  The patient is on the cardiac monitor to evaluate for evidence of arrhythmia and/or significant heart rate changes.  Labs interpreted as anemia, chronic in nature.  Metabolic panel no significant acute abnormalities except hypocalcemia is noted.  Given the patient's degree of anasarca rapidity of weight gain, and presentation today as well as already seen by cardiology with the recommendation for admission for IV diuresis I think it is quite reasonable to admit the patient  I placed a consult with her hospitalist and after discussing the case with Dr. Charleen Kirks, The patient will be admitted for further care and treatment.  In addition [facility] cardiology will be providing inpatient consult.  Patient is understanding agreeable with plan for admission.  80 mg of IV Lasix ordered as per recommendation of Francee Piccolo, NP of [place].  ----------------------------------------- 1:52 PM on 02/09/2022 -----------------------------------------      FINAL CLINICAL IMPRESSION(S) / ED DIAGNOSES   Final  diagnoses:  Anasarca  Acute pulmonary edema with congestive heart failure ([place])     Rx / DC Orders   ED Discharge Orders     None        Note:  This document was prepared using Dragon voice recognition software and may include unintentional dictation errors.   Delman Kitten, MD 02/09/22 1352

## 2022-02-09 NOTE — Assessment & Plan Note (Signed)
-   Continue home atorvastatin 

## 2022-02-09 NOTE — Assessment & Plan Note (Signed)
Renal function at baseline at this time  - Monitor BMP daily while IV diuresing

## 2022-02-09 NOTE — ED Triage Notes (Signed)
Pt here with significant swelling to her abd and bilateral legs. Pt states she is on a diuretic but she has gained 26 lbs in 1 month. Pt states is here for admission.

## 2022-02-09 NOTE — Assessment & Plan Note (Signed)
Patient has a history of syncope with the most recent episode approximately 1 month ago.  This is being monitored by her cardiologist but will admit to a telemetry unit while admitted as she is high risk for ventricular arrhythmia.  AICD placement was intended, but delayed due to anemia.  -Telemetry monitoring

## 2022-02-09 NOTE — ED Triage Notes (Signed)
First Nurse Note:  Arrives from heart care for ED evaluation for 28 lb weight gain over past month.  Patietn is AAOx3.  Skin warm and dry. NAD

## 2022-02-09 NOTE — ED Notes (Signed)
Purewick applied to Pt  

## 2022-02-09 NOTE — ED Notes (Signed)
Patient vomited on floor. States she vomits when she does not get enough to eat. Pill noted to be in vomit on floor. Patient AOX4 with even, unlabored respirations. Reports continued nausea.

## 2022-02-09 NOTE — Patient Instructions (Signed)
Medication Instructions:  Your physician recommends that you continue on your current medications as directed. Please refer to the Current Medication list given to you today.  *If you need a refill on your cardiac medications before your next appointment, please call your pharmacy*   Lab Work: NONE   If you have labs (blood work) drawn today and your tests are completely normal, you will receive your results only by: [place] (if you have MyChart) OR A paper copy in the mail If you have any lab test that is abnormal or we need to change your treatment, we will call you to review the results.   Testing/Procedures: NONE    Follow-Up: At [facility], you and your health needs are our priority.  As part of our continuing mission to provide you with exceptional heart care, we have created designated Provider Care Teams.  These Care Teams include your primary Cardiologist (physician) and Advanced Practice Providers (APPs -  Physician Assistants and Nurse Practitioners) who all work together to provide you with the care you need, when you need it.  We recommend signing up for the patient portal called "MyChart".  Sign up information is provided on this After Visit Summary.  MyChart is used to connect with patients for Virtual Visits (Telemedicine).  Patients are able to view lab/test results, encounter notes, upcoming appointments, etc.  Non-urgent messages can be sent to your provider as well.   To learn more about what you can do with MyChart, go to [email or web address].    Your next appointment:   2 week(s) after Discharge from hospital   The format for your next appointment:   In Person  Provider:   You may see [name], MD or one of the following Advanced Practice Providers on your designated Care Team:   Murray Hodgkins, NP Christell Faith, PA-C Cadence Kathlen Mody, PA-C Gerrie Nordmann, NP    Other Instructions Your Provider request that you are seen in the ER.    Thank you for choosing [facility]!    Important Information About Sugar

## 2022-02-10 ENCOUNTER — Inpatient Hospital Stay: Payer: Medicaid Other | Admitting: Oncology

## 2022-02-10 ENCOUNTER — Encounter: Payer: Self-pay | Admitting: Internal Medicine

## 2022-02-10 ENCOUNTER — Inpatient Hospital Stay: Payer: Medicaid Other

## 2022-02-10 DIAGNOSIS — D649 Anemia, unspecified: Secondary | ICD-10-CM

## 2022-02-10 DIAGNOSIS — I5023 Acute on chronic systolic (congestive) heart failure: Secondary | ICD-10-CM | POA: Diagnosis not present

## 2022-02-10 LAB — BASIC METABOLIC PANEL
Anion gap: 4 — ABNORMAL LOW (ref 5–15)
BUN: 26 mg/dL — ABNORMAL HIGH (ref 6–20)
CO2: 28 mmol/L (ref 22–32)
Calcium: 7.5 mg/dL — ABNORMAL LOW (ref 8.9–10.3)
Chloride: 104 mmol/L (ref 98–111)
Creatinine, Ser: 1.31 mg/dL — ABNORMAL HIGH (ref 0.44–1.00)
GFR, Estimated: 54 mL/min — ABNORMAL LOW (ref >60)
Glucose, Bld: 121 mg/dL — ABNORMAL HIGH (ref 70–99)
Potassium: 4.4 mmol/L (ref 3.5–5.1)
Sodium: 136 mmol/L (ref 135–145)

## 2022-02-10 LAB — CBG MONITORING, ED
Glucose-Capillary: 104 mg/dL — ABNORMAL HIGH (ref 70–99)
Glucose-Capillary: 135 mg/dL — ABNORMAL HIGH (ref 70–99)

## 2022-02-10 LAB — CBC WITH DIFFERENTIAL/PLATELET
Abs Immature Granulocytes: 0.03 10*3/uL (ref 0.00–0.07)
Basophils Absolute: 0.1 10*3/uL (ref 0.0–0.1)
Basophils Relative: 2 %
Eosinophils Absolute: 0.1 10*3/uL (ref 0.0–0.5)
Eosinophils Relative: 1 %
HCT: 28.9 % — ABNORMAL LOW (ref 36.0–46.0)
Hemoglobin: 7.5 g/dL — ABNORMAL LOW (ref 12.0–15.0)
Immature Granulocytes: 1 %
Lymphocytes Relative: 16 %
Lymphs Abs: 1.1 10*3/uL (ref 0.7–4.0)
MCH: 19 pg — ABNORMAL LOW (ref 26.0–34.0)
MCHC: 26 g/dL — ABNORMAL LOW (ref 30.0–36.0)
MCV: 73.4 fL — ABNORMAL LOW (ref 80.0–100.0)
Monocytes Absolute: 0.4 10*3/uL (ref 0.1–1.0)
Monocytes Relative: 7 %
Neutro Abs: 4.9 10*3/uL (ref 1.7–7.7)
Neutrophils Relative %: 73 %
Platelets: 491 10*3/uL — ABNORMAL HIGH (ref 150–400)
RBC: 3.94 MIL/uL (ref 3.87–5.11)
RDW: 22.2 % — ABNORMAL HIGH (ref 11.5–15.5)
WBC: 6.6 10*3/uL (ref 4.0–10.5)
nRBC: 0.5 % — ABNORMAL HIGH (ref 0.0–0.2)

## 2022-02-10 LAB — HEMOGLOBIN A1C
Hgb A1c MFr Bld: 9.6 % — ABNORMAL HIGH (ref 4.8–5.6)
Mean Plasma Glucose: 228.82 mg/dL

## 2022-02-10 LAB — MAGNESIUM: Magnesium: 1.9 mg/dL (ref 1.7–2.4)

## 2022-02-10 LAB — URINE DRUG SCREEN, QUALITATIVE (ARMC ONLY)
Amphetamines, Ur Screen: NOT DETECTED
Barbiturates, Ur Screen: NOT DETECTED
Benzodiazepine, Ur Scrn: NOT DETECTED
Cannabinoid 50 Ng, Ur ~~LOC~~: NOT DETECTED
Cocaine Metabolite,Ur ~~LOC~~: POSITIVE — AB
MDMA (Ecstasy)Ur Screen: NOT DETECTED
Methadone Scn, Ur: NOT DETECTED
Opiate, Ur Screen: NOT DETECTED
Phencyclidine (PCP) Ur S: NOT DETECTED
Tricyclic, Ur Screen: NOT DETECTED

## 2022-02-10 LAB — GLUCOSE, CAPILLARY
Glucose-Capillary: 92 mg/dL (ref 70–99)
Glucose-Capillary: 173 mg/dL — ABNORMAL HIGH (ref 70–99)

## 2022-02-10 NOTE — Consult Note (Signed)
Cardiology Consultation:   Patient ID: Molly Howard; [id number]; 1985/10/16   Admit date: 02/09/2022 Date of Consult: 02/10/2022  Primary Care Provider: Margarita Mail, DO Primary Cardiologist: Azucena Cecil Primary Electrophysiologist:  Lalla Brothers   Patient Profile:   Molly Howard is a 36 y.o. female with a hx of HFrEF secondary to NICM, CKD stage II, DM 2, HTN, menorrhagia with microcytic anemia, obesity, GERD, and prior polysubstance abuse with medication nonadherence who is being seen today for the evaluation of massive volume overload with acute on chronic HFrEF at the request of Dr. Huel Cote.  History of Present Illness:   Molly Howard was initially diagnosed with heart failure when she presented in 10/2019, with dyspnea and volume overload.  Echo at that time demonstrated an EF of 35 to 40% with grade 2 diastolic dysfunction.  Subsequent stress testing was nonischemic.  Following this, she struggled to afford her medications.  She was readmitted in 05/2020 with COVID-19 and found to have an EF of 20 to 25%.  Following discharge, she was followed closely in cardiology and the [facility] heart failure clinic with initiation and escalation of GDMT.  She was readmitted in 10/2020 in the setting of cocaine use.  Echo during that admission showed an EF of 30 to 35%, global hypokinesis, grade 2 diastolic dysfunction, mildly reduced RV systolic function, and moderate tricuspid regurgitation.  Following discharge, she did not pick up any of her medications and was readmitted in 10/2020 with marked volume overload.  In the setting of ongoing LV dysfunction, she was referred to EP.  Cardiac MRI showed an EF of 42% with no LGE and normal RV function.  In this setting, ICD was not indicated.  She was referred to genetic counseling in the setting of her mother also having had a cardiomyopathy.  Follow-up limited echo in 08/2021 demonstrated an EF of 25 to 30% and she was referred back to EP.  At her visit with  the EP in 10/2021 she reported an episode of syncope that occurred while standing.  Subsequently, decision was made to pursue placement of subcutaneous AICD.  However, lab work obtained in clinic at that time showed a hemoglobin of 8.1, which was down from baseline.  Given this, ICD was canceled and she was advised to follow-up with her PCP related to menorrhagia.  It was noted by her PCP that she had previously been evaluated by GYN with a diagnosis of endometrial polyp and was given oral contraceptives, though had not started these.  At her follow-up with PCP earlier this month she still has not started oral contraceptives, though menstrual cycles were more manageable.  She remains anemic at that time with a hemoglobin of 7.5 and was referred to hematology.  She was evaluated in our office on 02/09/2022 noting progressive weight gain and volume excess despite reported compliance with her medications and avoidance of illicit substances.  Her weight was up from a prior level of 186 pounds in 08/2021 to a weight of 263 pounds in our office on 02/09/2022.  Her weight was up 25 pounds since 01/18/2022 according to chart biopsy.  With this, she had progressive exertional dyspnea, increasing abdominal girth, significant lower extremity swelling, orthopnea, PND, early satiety, and chest discomfort associated with coughing productive of clear sputum.  She also reported 1 episode of syncope since her prior EP visit that occurred in September as she stood up.  Given significant volume overload, she was referred to the ED where she was found to be  hemodynamically stable.  Labs notable for a BNP of 859, albumin 1.8, hemoglobin 7.5, PLT 473, BUN 24, serum creatinine 1.14.  Chest x-ray showed cardiomegaly with mild pulmonary edema.  She has been continued on GDMT and placed on IV Lasix 80 mg twice daily with a documented urine output of 1.2 L for the admission.  She remains orthopneic this morning, though is hemodynamically  stable.  She reports adherence to cardiac medications and denies any further illicit substance use.  She is drinking less than 2 L of fluid per day and monitoring her salt intake.  She is not eating out at restaurants or takeout food frequently.   Past Medical History:  Diagnosis Date   Acid reflux    Chronic HFrEF (heart failure with reduced ejection fraction) ([place])    a. 10/2019 Echo: EF 35-40%, GrII DD; b. 05/2020 Echo: EF 20-25%, glob HK; c. 10/2020 Echo: EF 30-35%, glob HK. GrII DD, Mildly red RV fxn. Mod TR; d.  05/2021 cMRI: EF 42%, no LGE. Nl RV size/fxn.   CKD (chronic kidney disease), stage II    Diabetes mellitus ([place])    H/O medication noncompliance    Hypertension    Microcytic anemia    NICM (nonischemic cardiomyopathy) ([place])    a. 10/2019 Echo: EF 35-40%; b. 10/2019 MV: No ischemia. Small apical defect-->breast attenuation; c. 05/2020 Echo: EF 20-25%; d. 10/2020 Echo: EF 30-35%, glob HK. GrII DD, Mildly red RV fxn. Mod TR; e. 05/2021 cMRI: EF 42%, no LGE. Nl RV size/fxn.   Obesity    Polysubstance abuse ([place])     Past Surgical History:  Procedure Laterality Date   CHOLECYSTECTOMY     HERNIA REPAIR       Home Meds: Prior to Admission medications   Medication Sig Start Date End Date Taking? Authorizing Provider  atorvastatin (LIPITOR) 20 MG tablet TAKE 1 TABLET BY MOUTH DAILY 12/14/21  Yes Darylene Price A, FNP  carvedilol (COREG) 6.25 MG tablet Take 1 tablet (6.25 mg total) by mouth 2 (two) times daily with a meal. 12/11/20  Yes Darylene Price A, FNP  dapagliflozin propanediol (FARXIGA) 10 MG TABS tablet Take 1 tablet (10 mg total) by mouth once daily. 11/05/21  Yes Darylene Price A, FNP  famotidine (PEPCID) 20 MG tablet Take 1 tablet (20 mg total) by mouth 2 (two) times daily. 06/12/21  Yes [name], DO  insulin glargine (LANTUS SOLOSTAR) 100 UNIT/ML Solostar Pen Take 10 units in the morning and 15 in the evenings. 02/04/22  Yes [name], DO  norethindrone (AYGESTIN)  5 MG tablet Take 1 tablet (5 mg total) by mouth daily. 1/61/09  Yes Copland, Alicia B, PA-C  potassium chloride SA (KLOR-CON M) 20 MEQ tablet Take 0.5 tablets (10 mEq total) by mouth daily. 04/29/21  Yes Hackney, Tina A, FNP  sacubitril-valsartan (ENTRESTO) 97-103 MG Take 1 tablet by mouth 2 (two) times daily. 07/02/21  Yes [name], MD  spironolactone (ALDACTONE) 25 MG tablet TAKE 1 TABLET BY MOUTH DAILY 12/14/21  Yes Darylene Price A, FNP  torsemide (DEMADEX) 20 MG tablet Take 60 mg by mouth daily. 03/31/21  Yes [provider]  acetaminophen (TYLENOL) 500 MG tablet Take 1,000 mg by mouth every 6 (six) hours as needed (for pain.).    [provider]  Potassium Chloride 40 MEQ/15ML (20%) SOLN Take 40 mEq by mouth 2 (two) times daily. 07/04/20 10/18/20  [name], MD    Inpatient Medications: Scheduled Meds:  atorvastatin  20 mg Oral  Daily   carvedilol  6.25 mg Oral BID WC   dapagliflozin propanediol  10 mg Oral Daily   enoxaparin (LOVENOX) injection  0.5 mg/kg Subcutaneous Q24H   famotidine  20 mg Oral BID   furosemide  80 mg Intravenous BID   insulin aspart  0-20 Units Subcutaneous TID WC   insulin glargine-yfgn  10 Units Subcutaneous QHS   sacubitril-valsartan  1 tablet Oral BID   sodium chloride flush  3 mL Intravenous Q12H   spironolactone  25 mg Oral Daily   Continuous Infusions:  sodium chloride     PRN Meds: sodium chloride, acetaminophen, guaiFENesin-dextromethorphan, ondansetron (ZOFRAN) IV, sodium chloride flush  Allergies:   Allergies  Allergen Reactions   No [employer] Food Allergies Rash and Other (See Comments)    Lemons    Social History:   Social History   Socioeconomic History   Marital status: Single    Spouse name: Not on file   Number of children: Not on file   Years of education: Not on file   Highest education level: Not on file  Occupational History   Not on file  Tobacco Use   Smoking status: Former    Packs/day:  0.50    Types: Cigarettes    Quit date: 2022    Years since quitting: 1.7   Smokeless tobacco: Never  Vaping Use   Vaping Use: Never used  Substance and Sexual Activity   Alcohol use: Not Currently    Comment: ~ 4 shots/day on weekends only   Drug use: Not Currently    Types: Cocaine    Comment: Last day of use was prior to June 2021 hosp.visit.    Sexual activity: Not Currently    Birth control/protection: None  Other Topics Concern   Not on file  Social History Narrative   Not on file   Social Determinants of Health   Financial Resource Strain: High Risk (06/16/2020)   Overall Financial Resource Strain (CARDIA)    Difficulty of Paying Living Expenses: Hard  Food Insecurity: Food Insecurity Present (06/16/2020)   Hunger Vital Sign    Worried About Programme researcher, broadcasting/film/video in the Last Year: Often true    Ran Out of Food in the Last Year: Often true  Transportation Needs: Unmet Transportation Needs (06/16/2020)   PRAPARE - Administrator, Civil Service (Medical): Yes    Lack of Transportation (Non-Medical): Yes  Physical Activity: Not on file  Stress: Not on file  Social Connections: Not on file  Intimate Partner Violence: Not on file     Family History:   Family History  Problem Relation Age of Onset   Heart failure Mother        a. onset in her 18s   Diabetes Mother    Hypertension Father    Diabetes Father     ROS:  Review of Systems  Constitutional:  Positive for malaise/fatigue. Negative for chills, diaphoresis, fever and weight loss.  HENT:  Negative for congestion.   Eyes:  Negative for discharge and redness.  Respiratory:  Positive for cough, sputum production and shortness of breath. Negative for hemoptysis and wheezing.        Cough productive of clear sputum  Cardiovascular:  Positive for chest pain, orthopnea and leg swelling. Negative for palpitations, claudication and PND.       Chest pain associated with coughing  Gastrointestinal:  Negative  for abdominal pain, blood in stool, heartburn, melena, nausea and vomiting.  Genitourinary:  Negative for hematuria.  Musculoskeletal:  Negative for falls and myalgias.  Skin:  Negative for rash.  Neurological:  Positive for weakness. Negative for dizziness, tingling, tremors, sensory change, speech change, focal weakness and loss of consciousness.  Endo/Heme/Allergies:  Does not bruise/bleed easily.  Psychiatric/Behavioral:  Negative for substance abuse. The patient is not nervous/anxious.   All other systems reviewed and are negative.     Physical Exam/Data:   Vitals:   02/10/22 0600 02/10/22 0630 02/10/22 0700 02/10/22 0924  BP: 120/83 116/77 125/77 121/76  Pulse: 83 83 87 83  Resp: (!) 22 19  (!) 22  Temp:    97.8 F (36.6 C)  TempSrc:    Oral  SpO2: 96% 95%  96%  Weight:      Height:        Intake/Output Summary (Last 24 hours) at 02/10/2022 0943 Last data filed at 02/09/2022 2144 Gross per 24 hour  Intake 240 ml  Output 1500 ml  Net -1260 ml   Filed Weights   02/09/22 1210  Weight: 119.3 kg   Body mass index is 41.19 kg/m.   Physical Exam: General: Well developed, well nourished, in no acute distress. Head: Normocephalic, atraumatic, sclera non-icteric, no xanthomas, nares without discharge.  Neck: Negative for carotid bruits. JVD elevated to the angle of the mandible. Lungs: Diminished breath sounds along the bilateral bases. Breathing is unlabored. Heart: RRR with S1 S2. No murmurs, rubs, or gallops appreciated. Abdomen: Soft, non-tender, distended with normoactive bowel sounds. No hepatomegaly. No rebound/guarding. No obvious abdominal masses. Msk:  Strength and tone appear normal for age. Extremities: No clubbing or cyanosis.  3+ bilateral lower extremity edema extending to the hips with 1+ bilateral flank/lower back edema. Distal pedal pulses are 2+ and equal bilaterally. Neuro: Alert and oriented X 3. No facial asymmetry. No focal deficit. Moves all  extremities spontaneously. Psych:  Responds to questions appropriately with a normal affect.   EKG:  The EKG was personally reviewed and demonstrates: Sinus tachycardia, 102 bpm, left posterior fascicular block, nonspecific ST-T changes Telemetry:  Telemetry was personally reviewed and demonstrates: SR with isolated PVCs and artifact  Weights: Filed Weights   02/09/22 1210  Weight: 119.3 kg    Relevant CV Studies:  Limited echo 09/18/2021: 1. Left ventricular ejection fraction, by estimation, is 25 to 30%. Left  ventricular ejection fraction by 3D volume is 28 %. The left ventricle has  severely decreased function. The left ventricle demonstrates global  hypokinesis. Left ventricular  diastolic parameters are consistent with Grade II diastolic dysfunction  (pseudonormalization). The average left ventricular global longitudinal  strain is -5.9 %. The global longitudinal strain is abnormal.   2. Right ventricular systolic function is mildly reduced. The right  ventricular size is mildly enlarged. Tricuspid regurgitation signal is  inadequate for assessing PA pressure.   3. The mitral valve is normal in structure. No evidence of mitral valve  regurgitation. No evidence of mitral stenosis.   4. The aortic valve is tricuspid. Aortic valve regurgitation is not  visualized. No aortic stenosis is present.   5. The inferior vena cava is normal in size with greater than 50%  respiratory variability, suggesting right atrial pressure of 3 mmHg. __________  2D echo 06/30/2021: 1. Left ventricular ejection fraction, by estimation, is 30 to 35%. The  left ventricle has moderately decreased function. The left ventricle  demonstrates global hypokinesis. The left ventricular internal cavity size  was mildly dilated. Left ventricular  diastolic parameters are consistent  with Grade II diastolic dysfunction  (pseudonormalization).   2. Right ventricular systolic function is normal. The right  ventricular  size is normal. Tricuspid regurgitation signal is inadequate for assessing  PA pressure.   3. The mitral valve is normal in structure. Mild mitral valve  regurgitation. No evidence of mitral stenosis.   4. The aortic valve is tricuspid. Aortic valve regurgitation is not  visualized. No aortic stenosis is present.   5. The inferior vena cava is normal in size with greater than 50%  respiratory variability, suggesting right atrial pressure of 3 mmHg. __________  Cardiac MRI 05/19/2021: IMPRESSION: 1. Mild LV dilatation, mild hypertrophy, and mild systolic dysfunction (EF 42%) 2.  No late gadolinium enhancement to suggest myocardial scar 3.  Normal RV size and systolic function (EF 50%) __________  2D echo 03/20/2021: 1. Left ventricular ejection fraction, by estimation, is 25 to 30%. The  left ventricle has severely decreased function. The left ventricle  demonstrates global hypokinesis. Left ventricular diastolic parameters are  consistent with Grade II diastolic  dysfunction (pseudonormalization). The average left ventricular global  longitudinal strain is -9.2 %. The global longitudinal strain is abnormal.   2. Right ventricular systolic function is mildly reduced. The right  ventricular size is mildly enlarged. There is severely elevated pulmonary  artery systolic pressure. The estimated right ventricular systolic  pressure is 70.8 mmHg.   3. Left atrial size was mildly dilated.   4. The mitral valve is normal in structure. Mild mitral valve  regurgitation. No evidence of mitral stenosis.   5. Tricuspid valve regurgitation is mild to moderate.   6. The inferior vena cava is dilated in size with <50% respiratory  variability, suggesting right atrial pressure of 15 mmHg. __________  2D echo 10/11/2020: 1. Left ventricular ejection fraction, by estimation, is 30 to 35%. The  left ventricle has moderate to severely decreased function. The left  ventricle demonstrates  global hypokinesis. Left ventricular diastolic  parameters are consistent with Grade II  diastolic dysfunction (pseudonormalization).   2. Right ventricular systolic function is mildly reduced. The right  ventricular size is normal.   3. A small pericardial effusion is present.   4. The mitral valve is normal in structure. No evidence of mitral valve  regurgitation.   5. Tricuspid valve regurgitation is moderate.   6. The aortic valve is tricuspid. Aortic valve regurgitation is not  visualized. __________  2D echo 05/20/2020: 1. Left ventricular ejection fraction, by estimation, is 20 to 25%. The  left ventricle has severely decreased function. The left ventricle  demonstrates global hypokinesis. The left ventricular internal cavity size  was mildly dilated. Indeterminate  diastolic filling due to E-A fusion.   2. Right ventricular systolic function is normal. The right ventricular  size is mildly enlarged. There is severely elevated pulmonary artery  systolic pressure.   3. The mitral valve is normal in structure. Trivial mitral valve  regurgitation. No evidence of mitral stenosis.   4. Tricuspid valve regurgitation is moderate.   5. The aortic valve was not well visualized. Aortic valve regurgitation  is not visualized. No aortic stenosis is present.   6. The inferior vena cava is dilated in size with <50% respiratory  variability, suggesting right atrial pressure of 15 mmHg. ___________  Eugenie Birks MPI 10/05/2019: There was no ST segment deviation noted during stress. T wave inversion was noted during stress in the I and aVL leads. Defect 1: There is a small defect of mild severity present in the  apex location. This is likely due to breast attenuation artifact Nuclear stress EF: 30%. This is a high risk study due to low EF. Low EF seems to be due to nonischemic cardiomyopathy. ___________  2D echo 10/03/2019:  1. Left ventricular ejection fraction, by estimation, is 35 to 40%.  The  left ventricle has moderately decreased function. The left ventricle  demonstrates global hypokinesis. Left ventricular diastolic parameters are  consistent with Grade II diastolic  dysfunction (pseudonormalization).   2. Right ventricular systolic function is normal. The right ventricular  size is normal.   3. The mitral valve is normal in structure. No evidence of mitral valve  regurgitation. No evidence of mitral stenosis.   4. The aortic valve is normal in structure. Aortic valve regurgitation is  not visualized. No aortic stenosis is present.   5. The inferior vena cava is normal in size with greater than 50%  respiratory variability, suggesting right atrial pressure of 3 mmHg.  Laboratory Data:  Chemistry Recent Labs  Lab 02/09/22 1214 02/10/22 0453  NA 137 136  K 4.5 4.4  CL 105 104  CO2 26 28  GLUCOSE 184* 121*  BUN 24* 26*  CREATININE 1.14* 1.31*  CALCIUM 7.6* 7.5*  GFRNONAA >60 54*  ANIONGAP 6 4*    Recent Labs  Lab 02/09/22 1214  PROT 6.0*  ALBUMIN 1.8*  AST 18  ALT 17  ALKPHOS 120  BILITOT 0.7   Hematology Recent Labs  Lab 02/04/22 1421 02/09/22 1214 02/10/22 0455  WBC 6.3 5.7 6.6  RBC 3.85 3.87 3.94  HGB 7.5* 7.5* 7.5*  HCT 27.7* 28.4* 28.9*  MCV 71.9* 73.4* 73.4*  MCH 19.5* 19.4* 19.0*  MCHC 27.1* 26.4* 26.0*  RDW 19.4* 21.9* 22.2*  PLT 443* 473* 491*   Cardiac EnzymesNo results for input(s): "TROPONINI" in the last 168 hours. No results for input(s): "TROPIPOC" in the last 168 hours.  BNP Recent Labs  Lab 02/09/22 1214  BNP 859.3*    DDimer No results for input(s): "DDIMER" in the last 168 hours.  Radiology/Studies:  DG Chest 2 View  Result Date: 02/09/2022 IMPRESSION: Cardiomegaly and mild pulmonary edema. Electronically Signed   By: Merilyn Baba M.D.   On: 02/09/2022 13:14    Assessment and Plan:   1.  Acute on chronic HFrEF with NICM: -Patient is massively volume overloaded with a weight trend of 186 to 263 pounds  over this past year and a weight that is up 25 pounds from just 3 weeks prior -She reports adherence to medications and dietary restrictions and denies further illicit substance use -Suspect there is also some degree of significant third spacing with an albumin of 1.8 and hemoglobin of 7.5 -Would benefit from some addition of IV albumin to assist with augmentation of IV diuresis -She would also likely benefit from PRBC transfusion -For now, continue IV Lasix 80 mg twice daily with KCl repletion as indicated with close monitoring of urine output and renal function -Continue current GDMT including carvedilol, Entresto, spironolactone, and Farxiga -CHF education -Strict I's and O's with daily weights  2.  History of syncope: -Previously evaluated by EP with recommendation to proceed with subcutaneous AICD, however this was canceled due to significant anemia -High risk for ventricular arrhythmias -Monitor on telemetry -Beta-blocker as outlined above -Maintain potassium and magnesium at goal 4.0 and 2.0 respectively -Following aggressive diuresis and stabilization of anemia, would recommend EP evaluation for reconsideration of ICD implantation  3.  Hypoalbuminemia: -Contributing to third spacing -Consider IV  albumin  4.  Menorrhagia with microcytic anemia: -Ongoing issue -Low threshold for transfusion at the discretion of internal medicine  5.  History of polysubstance abuse: -She denies any further illicit substance use -Check urine drug screen  6.  History of medication nonadherence: -She reports adherence to all medications  7.  CKD stage II: -Stable -Auditor with diuresis  8.  HTN: -Blood pressure currently well controlled -Continue current therapy as outlined above     For questions or updates, please contact [place] Please consult [email or web address] for contact info under Cardiology/STEMI.   Signed, Christell Faith, PA-C [place] Pager: [phone] 02/10/2022,  9:43 AM

## 2022-02-10 NOTE — ED Notes (Signed)
Pt brought to ED rm 41 at this time, this RN now assuming care.

## 2022-02-10 NOTE — ED Notes (Signed)
Patient called the nurse's station and requested to go to the restroom. Patient was unhooked from the monitor and allowed to go to the restroom. Patient will be hooked back up to the monitor upon their return to the room.

## 2022-02-10 NOTE — Progress Notes (Signed)
PROGRESS NOTE    Molly Howard  VCB:[id number] DOB: January 25, 1986 DOA: 02/09/2022 PCP: [name], DO    Brief Narrative:   Molly Howard is a 36 y.o. female with medical history significant of HFrEF with most recent EF 25-30% secondary to nonischemic cardiomyopathy, type 2 diabetes, hypertension, hyperlipidemia, CKD stage II, chronic anemia who presents to the ED from her cardiologist office for acute heart failure.  10/11 BNP elevated. +orthopnia. Still with sob  Consultants:  cardiology  Procedures:   Antimicrobials:      Subjective: No cp  Objective: Vitals:   02/10/22 0700 02/10/22 0924 02/10/22 1238 02/10/22 1256  BP: 125/77 121/76 126/89 120/87  Pulse: 87 83 84 86  Resp:  (!) 22 18 18   Temp:  97.8 F (36.6 C) 97.6 F (36.4 C) (!) 97.5 F (36.4 C)  TempSrc:  Oral Oral   SpO2:  96% 100% 100%  Weight:      Height:        Intake/Output Summary (Last 24 hours) at 02/10/2022 1501 Last data filed at 02/10/2022 1350 Gross per 24 hour  Intake 240 ml  Output 1800 ml  Net -1560 ml   Filed Weights   02/09/22 1210  Weight: 119.3 kg    Examination: Calm, NAD Decrease bs at  bases , scattered fine crackles b/l.  Reg s1/s2 no gallop Soft benign +bs 2-3+ pitting edema Aaoxox3  Mood and affect appropriate in current setting     Data Reviewed: I have personally reviewed following labs and imaging studies  CBC: Recent Labs  Lab 02/04/22 1421 02/09/22 1214 02/10/22 0455  WBC 6.3 5.7 6.6  NEUTROABS 4,568  --  4.9  HGB 7.5* 7.5* 7.5*  HCT 27.7* 28.4* 28.9*  MCV 71.9* 73.4* 73.4*  PLT 443* 473* 384*   Basic Metabolic Panel: Recent Labs  Lab 02/09/22 1214 02/10/22 0453  NA 137 136  K 4.5 4.4  CL 105 104  CO2 26 28  GLUCOSE 184* 121*  BUN 24* 26*  CREATININE 1.14* 1.31*  CALCIUM 7.6* 7.5*  MG 1.7 1.9   GFR: Estimated Creatinine Clearance: 78.6 mL/min (A) (by C-G formula based on SCr of 1.31 mg/dL (H)). Liver Function Tests: Recent  Labs  Lab 02/09/22 1214  AST 18  ALT 17  ALKPHOS 120  BILITOT 0.7  PROT 6.0*  ALBUMIN 1.8*   No results for input(s): "LIPASE", "AMYLASE" in the last 168 hours. No results for input(s): "AMMONIA" in the last 168 hours. Coagulation Profile: No results for input(s): "INR", "PROTIME" in the last 168 hours. Cardiac Enzymes: No results for input(s): "CKTOTAL", "CKMB", "CKMBINDEX", "TROPONINI" in the last 168 hours. BNP (last 3 results) No results for input(s): "PROBNP" in the last 8760 hours. HbA1C: No results for input(s): "HGBA1C" in the last 72 hours. CBG: Recent Labs  Lab 02/09/22 1721 02/09/22 2116 02/10/22 0725 02/10/22 1115  GLUCAP 169* 132* 104* 135*   Lipid Profile: No results for input(s): "CHOL", "HDL", "LDLCALC", "TRIG", "CHOLHDL", "LDLDIRECT" in the last 72 hours. Thyroid Function Tests: No results for input(s): "TSH", "T4TOTAL", "FREET4", "T3FREE", "THYROIDAB" in the last 72 hours. Anemia Panel: Recent Labs    02/09/22 1214  FERRITIN 5*  TIBC 332  IRON 21*   Sepsis Labs: No results for input(s): "PROCALCITON", "LATICACIDVEN" in the last 168 hours.  No results found for this or any previous visit (from the past 240 hour(s)).       Radiology Studies: DG Chest 2 View  Result Date: 02/09/2022 CLINICAL  DATA:  Shortness of breath EXAM: CHEST - 2 VIEW COMPARISON:  07/21/2021 FINDINGS: Cardiomegaly, which appears somewhat similar to 11/19/2020 but increased compared to 07/21/2021. Interstitial opacities, likely pulmonary edema. No pleural effusion or pneumothorax. No acute osseous abnormality. IMPRESSION: Cardiomegaly and mild pulmonary edema. Electronically Signed   By: Merilyn Baba M.D.   On: 02/09/2022 13:14        Scheduled Meds:  atorvastatin  20 mg Oral Daily   carvedilol  6.25 mg Oral BID WC   dapagliflozin propanediol  10 mg Oral Daily   enoxaparin (LOVENOX) injection  0.5 mg/kg Subcutaneous Q24H   famotidine  20 mg Oral BID   furosemide  80  mg Intravenous BID   insulin aspart  0-20 Units Subcutaneous TID WC   insulin glargine-yfgn  10 Units Subcutaneous QHS   sacubitril-valsartan  1 tablet Oral BID   sodium chloride flush  3 mL Intravenous Q12H   spironolactone  25 mg Oral Daily   Continuous Infusions:  sodium chloride      Assessment & Plan:   Principal Problem:   Acute on chronic HFrEF (heart failure with reduced ejection fraction) (HCC) Active Problems:   Chronic anemia   Essential hypertension   Diabetes mellitus, type II (HCC)   HLD (hyperlipidemia)   Chronic kidney disease (CKD), stage II (mild)   Syncope   Acute on chronic HFrEF (heart failure with reduced ejection fraction) (HCC) Continue current GDMT including carvedilol, Entresto, spironolactone, and Farxiga 10/11 cards input was appreciated. Cocaine positive Continue iv lasix, I's and O's, daily weight CHF education    Chronic anemia Patient has a long-term history of chronic anemia secondary to iron deficiency in the setting of chronic blood loss secondary to menorrhagia.  Hemoglobin today is low at 7.5.  Iron panel demonstrates severe iron deficiency. 10/11 s/p iv iron yesterday May benefit from iron supplement    Essential hypertension Continue cardiac meds Stable. On lasix for above Home   Diabetes mellitus, type II ([place]) Uncontrolled. A1c pending   HLD (hyperlipidemia) - Continue home atorvastatin   Chronic kidney disease (CKD), stage II (mild) Renal function at baseline at this time  monitor  Syncope Patient has a history of syncope with the most recent episode approximately 1 month ago.  This is being monitored by her cardiologist but will admit to a telemetry unit while admitted as she is high risk for ventricular arrhythmia.  AICD placement was intended, but delayed due to anemia.  Drug abuse Cocaine positive.  DVT prophylaxis:lovenox Code Status:full Family Communication: none Disposition Plan:  Status is:  Inpatient Remains inpatient appropriate because: iv treatment      LOS: 1 day   Time spent: 19 min    Nolberto Hanlon, MD [facility] Pager [phone]  If 7PM-7AM, please contact night-coverage 02/10/2022, 3:01 PM

## 2022-02-10 NOTE — Plan of Care (Signed)
  Problem: Education: Goal: Ability to describe self-care measures that may prevent or decrease complications (Diabetes Survival Skills Education) will improve Outcome: Progressing   Problem: Coping: Goal: Ability to adjust to condition or change in health will improve Outcome: Progressing   Problem: Fluid Volume: Goal: Ability to maintain a balanced intake and output will improve Outcome: Progressing   

## 2022-02-10 NOTE — ED Notes (Signed)
Pt taken off monitor to use restroom. Pt ambulatory with walker to and from bathroom. Pt back in bed and reconnected to monitor. Pt has no other needs at this time.

## 2022-02-10 NOTE — Consult Note (Addendum)
Heart Failure Nurse Navigator Note  HFrEF 25-30%.  2 diastolic dysfunction.  Right ventricular systolic function is mildly reduced.  She was sent from her cardiologist office due to fluid overload.  BNP elevated at 859.  Chest x-ray revealed cardiomegaly and mild pulmonary edema.  Weight was up approximately 77 pounds from April of this year.  Comorbidities:  Noncompliance Hypertension Type 2 diabetes Chronic kidney disease stage III GERD Anemia Obesity History of polysubstance abuse   Medications:  Atorvastatin 20 mg daily Coreg 6.25 mg 2 times a day Farxiga 10 mg daily Furosemide 80 mg IV 2 times a day Entresto 97/103 mg twice a day Spironolactone 25 mg daily  Labs:  Sodium 137, potassium 4.5, chloride 105, CO2 26, BUN 24, creatinine 1.14 hemoglobin 7.5, GFR 54 Weight is 119.3 kg Blood pressure 121/76 UDS positive for cocaine  Met with patient today, she was lying quietly in no acute distress the emergency room.  She states that her swelling is down in her legs and her stomach but she is still not back to baseline.  She states at home that she had noted that her legs were getting bigger along with her stomach and also noted that the diuretic she was taking "she was not peeing out that she normally does."  In discussing diet and fluid restriction she states that she had not been eating at restaurants or fast foods and that she had been drinking 60 mL or less.  Besides being followed in the outpatient heart failure clinic she is also followed by para medicine.  She has been known to call Kristi with para medicine and request that she get her a sausage biscuit from [place].  On September 27 of this year she canceled her heart failure clinic appointment because she needed to get her hair done.   Went over by teach back  fluid, sodium restriction, daily weights and what to report.  Suggested keeping notebook with her daily weights she can take to her appointments.  She  had not further questions --will continue to follow.  Has an appointment with the outpatient heart failure clinic on October 20 at 11 AM.  She has an 18% no-show ratio which is 13 out of 72 appointments.  Pricilla Riffle RN [facility]

## 2022-02-11 DIAGNOSIS — E782 Mixed hyperlipidemia: Secondary | ICD-10-CM

## 2022-02-11 DIAGNOSIS — I420 Dilated cardiomyopathy: Secondary | ICD-10-CM

## 2022-02-11 DIAGNOSIS — R601 Generalized edema: Secondary | ICD-10-CM | POA: Diagnosis not present

## 2022-02-11 DIAGNOSIS — J810 Acute pulmonary edema: Secondary | ICD-10-CM

## 2022-02-11 DIAGNOSIS — N182 Chronic kidney disease, stage 2 (mild): Secondary | ICD-10-CM

## 2022-02-11 DIAGNOSIS — D649 Anemia, unspecified: Secondary | ICD-10-CM | POA: Diagnosis not present

## 2022-02-11 DIAGNOSIS — E1159 Type 2 diabetes mellitus with other circulatory complications: Secondary | ICD-10-CM

## 2022-02-11 DIAGNOSIS — I501 Left ventricular failure: Secondary | ICD-10-CM

## 2022-02-11 DIAGNOSIS — R55 Syncope and collapse: Secondary | ICD-10-CM

## 2022-02-11 DIAGNOSIS — I5023 Acute on chronic systolic (congestive) heart failure: Secondary | ICD-10-CM | POA: Diagnosis not present

## 2022-02-11 LAB — BASIC METABOLIC PANEL
Anion gap: 5 (ref 5–15)
BUN: 26 mg/dL — ABNORMAL HIGH (ref 6–20)
CO2: 27 mmol/L (ref 22–32)
Calcium: 7.4 mg/dL — ABNORMAL LOW (ref 8.9–10.3)
Chloride: 108 mmol/L (ref 98–111)
Creatinine, Ser: 1.29 mg/dL — ABNORMAL HIGH (ref 0.44–1.00)
GFR, Estimated: 55 mL/min — ABNORMAL LOW (ref >60)
Glucose, Bld: 170 mg/dL — ABNORMAL HIGH (ref 70–99)
Potassium: 3.8 mmol/L (ref 3.5–5.1)
Sodium: 140 mmol/L (ref 135–145)

## 2022-02-11 LAB — GLUCOSE, CAPILLARY
Glucose-Capillary: 127 mg/dL — ABNORMAL HIGH (ref 70–99)
Glucose-Capillary: 167 mg/dL — ABNORMAL HIGH (ref 70–99)
Glucose-Capillary: 118 mg/dL — ABNORMAL HIGH (ref 70–99)
Glucose-Capillary: 141 mg/dL — ABNORMAL HIGH (ref 70–99)
Glucose-Capillary: 151 mg/dL — ABNORMAL HIGH (ref 70–99)

## 2022-02-11 MED ORDER — LIVING WELL WITH DIABETES BOOK
Freq: Once | Status: AC
  Filled 2022-02-11: qty 1

## 2022-02-11 MED ORDER — FUROSEMIDE 10 MG/ML IJ SOLN
80.0000 mg | Freq: Three times a day (TID) | INTRAMUSCULAR | Status: DC
  Administered 2022-02-11 – 2022-02-16 (×15): 80 mg via INTRAVENOUS
  Filled 2022-02-11 (×16): qty 8

## 2022-02-11 MED ORDER — FERROUS SULFATE 325 (65 FE) MG PO TABS
325.0000 mg | ORAL_TABLET | Freq: Every day | ORAL | Status: DC
  Administered 2022-02-12 – 2022-02-16 (×5): 325 mg via ORAL
  Filled 2022-02-11 (×5): qty 1

## 2022-02-11 NOTE — Progress Notes (Signed)
  Transition of Care (TOC) Screening Note   Patient Details  Name: Molly Howard Date of Birth: August 19, 1985   Transition of Care [facility]) CM/SW Contact:    Alberteen Sam, LCSW Phone Number: 02/11/2022, 10:04 AM    Transition of Care Department [facility]) has reviewed patient and no TOC needs have been identified at this time. We will continue to monitor patient advancement through interdisciplinary progression rounds. If new patient transition needs arise, please place a TOC consult.  [place], [place]

## 2022-02-11 NOTE — Progress Notes (Signed)
PROGRESS NOTE    Molly Howard  GGY:[id number] DOB: 11/17/1985 DOA: 02/09/2022 PCP: [name], DO    Brief Narrative:   Molly Howard is a 36 y.o. female with medical history significant of HFrEF with most recent EF 25-30% secondary to nonischemic cardiomyopathy, type 2 diabetes, hypertension, hyperlipidemia, CKD stage II, chronic anemia who presents to the ED from her cardiologist office for acute heart failure.  10/12 starting to feel little better not at baseline  Consultants:  cardiology  Procedures:   Antimicrobials:      Subjective: Still with shortness of breath no chest pain  Objective: Vitals:   02/10/22 2345 02/11/22 0625 02/11/22 0927 02/11/22 1236  BP: 120/79  (!) 129/95 122/79  Pulse: 81  86 85  Resp: 18  16 16   Temp: 97.6 F (36.4 C)  97.7 F (36.5 C) (!) 97.5 F (36.4 C)  TempSrc:      SpO2: 100%  98% 100%  Weight:  113.9 kg    Height:        Intake/Output Summary (Last 24 hours) at 02/11/2022 1534 Last data filed at 02/11/2022 0930 Gross per 24 hour  Intake --  Output 3550 ml  Net -3550 ml   Filed Weights   02/09/22 1210 02/11/22 0625  Weight: 119.3 kg 113.9 kg    Examination: Calm, NAD Decreased breath sounds no wheezing Reg s1/s2 no gallop Soft benign +bs +LE edema b/l Aaoxox3  Mood and affect appropriate in current setting     Data Reviewed: I have personally reviewed following labs and imaging studies  CBC: Recent Labs  Lab 02/09/22 1214 02/10/22 0455  WBC 5.7 6.6  NEUTROABS  --  4.9  HGB 7.5* 7.5*  HCT 28.4* 28.9*  MCV 73.4* 73.4*  PLT 473* 035*   Basic Metabolic Panel: Recent Labs  Lab 02/09/22 1214 02/10/22 0453 02/11/22 0428  NA 137 136 140  K 4.5 4.4 3.8  CL 105 104 108  CO2 26 28 27   GLUCOSE 184* 121* 170*  BUN 24* 26* 26*  CREATININE 1.14* 1.31* 1.29*  CALCIUM 7.6* 7.5* 7.4*  MG 1.7 1.9  --    GFR: Estimated Creatinine Clearance: 77.8 mL/min (A) (by C-G formula based on SCr of 1.29 mg/dL  (H)). Liver Function Tests: Recent Labs  Lab 02/09/22 1214  AST 18  ALT 17  ALKPHOS 120  BILITOT 0.7  PROT 6.0*  ALBUMIN 1.8*   No results for input(s): "LIPASE", "AMYLASE" in the last 168 hours. No results for input(s): "AMMONIA" in the last 168 hours. Coagulation Profile: No results for input(s): "INR", "PROTIME" in the last 168 hours. Cardiac Enzymes: No results for input(s): "CKTOTAL", "CKMB", "CKMBINDEX", "TROPONINI" in the last 168 hours. BNP (last 3 results) No results for input(s): "PROBNP" in the last 8760 hours. HbA1C: Recent Labs    02/10/22 0455  HGBA1C 9.6*   CBG: Recent Labs  Lab 02/10/22 1115 02/10/22 1721 02/10/22 2043 02/11/22 0846 02/11/22 1316  GLUCAP 135* 92 173* 127* 167*   Lipid Profile: No results for input(s): "CHOL", "HDL", "LDLCALC", "TRIG", "CHOLHDL", "LDLDIRECT" in the last 72 hours. Thyroid Function Tests: No results for input(s): "TSH", "T4TOTAL", "FREET4", "T3FREE", "THYROIDAB" in the last 72 hours. Anemia Panel: Recent Labs    02/09/22 1214  FERRITIN 5*  TIBC 332  IRON 21*   Sepsis Labs: No results for input(s): "PROCALCITON", "LATICACIDVEN" in the last 168 hours.  No results found for this or any previous visit (from the past 240 hour(s)).  Radiology Studies: No results found.      Scheduled Meds:  atorvastatin  20 mg Oral Daily   carvedilol  6.25 mg Oral BID WC   dapagliflozin propanediol  10 mg Oral Daily   enoxaparin (LOVENOX) injection  0.5 mg/kg Subcutaneous Q24H   famotidine  20 mg Oral BID   [START ON 02/12/2022] ferrous sulfate  325 mg Oral Q breakfast   furosemide  80 mg Intravenous Q8H   insulin aspart  0-20 Units Subcutaneous TID WC   insulin glargine-yfgn  10 Units Subcutaneous QHS   living well with diabetes book   Does not apply Once   sacubitril-valsartan  1 tablet Oral BID   sodium chloride flush  3 mL Intravenous Q12H   spironolactone  25 mg Oral Daily   Continuous Infusions:  sodium  chloride      Assessment & Plan:   Principal Problem:   Acute on chronic HFrEF (heart failure with reduced ejection fraction) (HCC) Active Problems:   Chronic anemia   Essential hypertension   Diabetes mellitus, type II (HCC)   HLD (hyperlipidemia)   Chronic kidney disease (CKD), stage II (mild)   Syncope   Dilated cardiomyopathy ([place])   Acute pulmonary edema with congestive heart failure ([place])   Acute on chronic HFrEF (heart failure with reduced ejection fraction) ([place]) Continue current GDMT including carvedilol, Entresto, spironolactone, and Farxiga 10/11 cards input was appreciated. Cocaine positive 10/12 continue Lasix IV I's and O's Daily weight    Chronic anemia Patient has a long-term history of chronic anemia secondary to iron deficiency in the setting of chronic blood loss secondary to menorrhagia.  Hemoglobin today is low at 7.5.  Iron panel demonstrates severe iron deficiency. 10/11 s/p iv iron yesterday 10/12 we will start on iron supplement   Essential hypertension Stable continue with current regimen   Diabetes mellitus, type II ([place]) Uncontrolled. A1c 9.6 BG stable continue R-ISS   HLD (hyperlipidemia) - Continue home atorvastatin   Chronic kidney disease (CKD), stage II (mild) Renal function at baseline at this time  monitor  Syncope Patient has a history of syncope with the most recent episode approximately 1 month ago.  This is being monitored by her cardiologist but will admit to a telemetry unit while admitted as she is high risk for ventricular arrhythmia.  AICD placement was intended, but delayed due to anemia.  Drug abuse Cocaine positive.  DVT prophylaxis:lovenox Code Status:full Family Communication: none Disposition Plan:  Status is: Inpatient Remains inpatient appropriate because: iv treatment      LOS: 2 days   Time spent: 35 min    Nolberto Hanlon, MD [facility] Pager [phone]  If 7PM-7AM, please contact  night-coverage 02/11/2022, 3:34 PM

## 2022-02-11 NOTE — Progress Notes (Signed)
Cardiology Progress Note   Patient Name: Molly Howard Date of Encounter: 02/11/2022  Primary Cardiologist: [name], MD  Subjective   Her breathing has improved at rest, though she does have occasional episodes of dyspnea at rest with a fluttering sensation in her chest.  She states she has some dizziness when changing positions from a sitting to a standing position but this resolves quickly.  She states that the swelling in her abdomen and legs is decreasing and she is tolerating her medications.  In discussion today, she admits to using cocaine w/in the past 10 days and has been using at least once/month.  Inpatient Medications    Scheduled Meds:  atorvastatin  20 mg Oral Daily   carvedilol  6.25 mg Oral BID WC   dapagliflozin propanediol  10 mg Oral Daily   enoxaparin (LOVENOX) injection  0.5 mg/kg Subcutaneous Q24H   famotidine  20 mg Oral BID   furosemide  80 mg Intravenous BID   insulin aspart  0-20 Units Subcutaneous TID WC   insulin glargine-yfgn  10 Units Subcutaneous QHS   sacubitril-valsartan  1 tablet Oral BID   sodium chloride flush  3 mL Intravenous Q12H   spironolactone  25 mg Oral Daily   Continuous Infusions:  sodium chloride     PRN Meds: sodium chloride, acetaminophen, guaiFENesin-dextromethorphan, ondansetron (ZOFRAN) IV, sodium chloride flush   Vital Signs    Vitals:   02/10/22 1256 02/10/22 2041 02/10/22 2345 02/11/22 0625  BP: 120/87 125/85 120/79   Pulse: 86 81 81   Resp: 18 18 18    Temp: (!) 97.5 F (36.4 C) (!) 97.5 F (36.4 C) 97.6 F (36.4 C)   TempSrc:  Oral    SpO2: 100% 98% 100%   Weight:    113.9 kg  Height:        Intake/Output Summary (Last 24 hours) at 02/11/2022 0759 Last data filed at 02/11/2022 0600 Gross per 24 hour  Intake --  Output 3050 ml  Net -3050 ml   Filed Weights   02/09/22 1210 02/11/22 0625  Weight: 119.3 kg 113.9 kg    Physical Exam   GEN: Obese, in no acute distress.  HEENT: Grossly  normal with poor dentition.  Neck: Supple, no carotid bruits, or masses. JVD to mandible.   Cardiac: RRR, no murmurs, rubs, or gallops. No clubbing or cyanosis.  Radials 2+, PT 2+ and equal bilaterally.  2+ edema to bilat LE to hips. Respiratory:  Respirations regular and unlabored.  Diminished bases bilaterally.  GI: obese, semi-firm and distended, nontender, BS + x 4. +1 edema from hips to lower abdomen.  MS: no deformity or atrophy. Skin: warm and dry, no rash. Neuro:  Strength and sensation are intact. Psych: AAOx3.  Normal affect.  Labs    Chemistry Recent Labs  Lab 02/09/22 1214 02/10/22 0453 02/11/22 0428  NA 137 136 140  K 4.5 4.4 3.8  CL 105 104 108  CO2 26 28 27   GLUCOSE 184* 121* 170*  BUN 24* 26* 26*  CREATININE 1.14* 1.31* 1.29*  CALCIUM 7.6* 7.5* 7.4*  PROT 6.0*  --   --   ALBUMIN 1.8*  --   --   AST 18  --   --   ALT 17  --   --   ALKPHOS 120  --   --   BILITOT 0.7  --   --   GFRNONAA >60 54* 55*  ANIONGAP 6 4* 5     Hematology  Recent Labs  Lab 02/04/22 1421 02/09/22 1214 02/10/22 0455  WBC 6.3 5.7 6.6  RBC 3.85 3.87 3.94  HGB 7.5* 7.5* 7.5*  HCT 27.7* 28.4* 28.9*  MCV 71.9* 73.4* 73.4*  MCH 19.5* 19.4* 19.0*  MCHC 27.1* 26.4* 26.0*  RDW 19.4* 21.9* 22.2*  PLT 443* 473* 491*   BNP    Component Value Date/Time   BNP 859.3 (H) 02/09/2022 1214   Lipids  Lab Results  Component Value Date   CHOL 286 (H) 06/12/2021   HDL 74 06/12/2021   LDLCALC 171 (H) 06/12/2021   TRIG 242 (H) 06/12/2021   CHOLHDL 3.9 06/12/2021    HbA1c  Lab Results  Component Value Date   HGBA1C 9.6 (H) 02/10/2022    Radiology    DG Chest 2 View  Result Date: 02/09/2022 CLINICAL DATA:  Shortness of breath EXAM: CHEST - 2 VIEW COMPARISON:  07/21/2021 FINDINGS: Cardiomegaly, which appears somewhat similar to 11/19/2020 but increased compared to 07/21/2021. Interstitial opacities, likely pulmonary edema. No pleural effusion or pneumothorax. No acute osseous  abnormality. IMPRESSION: Cardiomegaly and mild pulmonary edema. Electronically Signed   By: Merilyn Baba M.D.   On: 02/09/2022 13:14    Telemetry    RSR, NSVT up to 18 beats this AM (07:46 asymp) - Personally Reviewed  Cardiac Studies   2D Echocardiogram 5.2023  1. Left ventricular ejection fraction, by estimation, is 25 to 30%. Left  ventricular ejection fraction by 3D volume is 28 %. The left ventricle has  severely decreased function. The left ventricle demonstrates global  hypokinesis. Left ventricular  diastolic parameters are consistent with Grade II diastolic dysfunction  (pseudonormalization). The average left ventricular global longitudinal  strain is -5.9 %. The global longitudinal strain is abnormal.   2. Right ventricular systolic function is mildly reduced. The right  ventricular size is mildly enlarged. Tricuspid regurgitation signal is  inadequate for assessing PA pressure.   3. The mitral valve is normal in structure. No evidence of mitral valve  regurgitation. No evidence of mitral stenosis.   4. The aortic valve is tricuspid. Aortic valve regurgitation is not  visualized. No aortic stenosis is present.   5. The inferior vena cava is normal in size with greater than 50%  respiratory variability, suggesting right atrial pressure of 3 mmHg.  _____________   Patient Profile    Molly Howard is a 36 y.o. female with a history of HFrEF, nonischemic cardiomyopathy, hypertension, diabetes mellitus type 2, stage II chronic kidney disease, GERD, microcytic anemia, obesity, medication nonadherence, and polysubstance abuse admitted to [facility] on 02/09/22 for massive fluid overload with acute on chronic HFrEF.    Assessment & Plan    1. Acute on chronic heart failure with reduced ejection fraction/nonischemic cardiomyopathy:  Patient presented to [facility] following progressive dyspnea and weight gain of 77lbs since April 2023.  She has been receiving IV Lasix 80mg  BID with 12lb  weight loss since admission.  Creatinine 1.29 today. Patient with improving dyspnea and lower extremity edema. Admits to ongoing cocaine use and some medication noncompliance @ home.  Responded to outpt fuoscix but insufficiently to avoid admission.  Continue Lasix 80mg  BID with close monitoring of renal function.  Continue strict I&O's with daily weights.  Cont ? blocker, entresto, sglt2i, and spiro.  Discussed importance of cocaine cessation and possible interaction w/ ? blocker therapy.  2. Essential hypertension: Blood pressure during admission at goal on current medications. Continue carvedilol, entresto, spiro, diuresis.  3. Stage II chronic kidney  disease: Creatinine 1.29. Edema improving.  Follow w/ diuresis.  4. History of syncope/NSVT: Prior syncopal spell with echo in 08/2021 suggesting EF of 25-30%.  18 beats of NSVT this AM - was sleeping.  Continue carvedilol and telemetry monitoring.  Will need to consider amio despite youth. Eventual plan for ICD if she can remain off of cocaine and anemia stable.  5. Type 2 diabetes mellitus: HgbA1C 9.6.  Management per internal medicine.   6. Menorrhagia/microcytic anemia: chronic history of anemia secondary to menorrhagia.  Hemoglobin 7.5 with hematocrit 28.9. S/p IV iron infusion yesterday.  Per IM.  7. Hypoalbuminemia: Albumin 1.8.  Increased abdominal girth with suspicion of third spacing.  May benefit from IV albumin.   8. Polysubstance use: Urine drug screen positive for cocaine use.  Patient admits to using cocaine within the past 10 days and has been using ~ 1x/month.  Discussed in depth the consequences on cocaine use from a cardiac prospective with patient.  In agreement with avoidance in the future.   Signed, Murray Hodgkins, NP  02/11/2022, 7:59 AM    For questions or updates, please contact   Please consult [email or web address] for contact info under Cardiology/STEMI.

## 2022-02-11 NOTE — Inpatient Diabetes Management (Addendum)
Inpatient Diabetes Program Recommendations  AACE/ADA: New Consensus Statement on Inpatient Glycemic Control (2015)  Target Ranges:  Prepandial:   less than 140 mg/dL      Peak postprandial:   less than 180 mg/dL (1-2 hours)      Critically ill patients:  140 - 180 mg/dL   Lab Results  Component Value Date   GLUCAP 167 (H) 02/11/2022   HGBA1C 9.6 (H) 02/10/2022    Review of Glycemic Control  Latest Reference Range & Units 02/10/22 17:21 02/10/22 20:43 02/11/22 08:46 02/11/22 13:16  Glucose-Capillary 70 - 99 mg/dL 92 173 (H) 127 (H) 167 (H)  (H): Data is abnormally high  Diabetes history: DM2 Outpatient Diabetes medications: Lantus 15 units BID, Farxiga 10 mg QD Current orders for Inpatient glycemic control: Semglee 10 units QD, Novolog 0-20 units TID, Farxiga 10 mg QD  Reviewed patient's current A1c of 9.6% (average glucose of 229 mg/dL). Explained what a A1c is and what it measures. Also reviewed goal A1c with patient, importance of good glucose control @ home, and blood sugar goals.  She states her PCP increased her Lantus to 15 units BID last week from 10 QAM and 15 QHS.  Since then she states her blood sugar has been low 100's.  She checks her glucose daily and when she feels low.  She is aware of hypoglycemia signs, symptoms and treatments.    Denies difficulties obtaining insulins.  She is asking if her prescriptions at DC be delivered to bedside.    Ordered Living Well with DM booklet.    Will continue to follow while inpatient.  Thank you, Reche Dixon, MSN, [place] [facility] Coordinator Inpatient Diabetes Program [phone] (team pager from 8a-5p)

## 2022-02-12 DIAGNOSIS — I501 Left ventricular failure: Secondary | ICD-10-CM | POA: Diagnosis not present

## 2022-02-12 DIAGNOSIS — I5023 Acute on chronic systolic (congestive) heart failure: Secondary | ICD-10-CM | POA: Diagnosis not present

## 2022-02-12 DIAGNOSIS — R601 Generalized edema: Secondary | ICD-10-CM | POA: Diagnosis not present

## 2022-02-12 DIAGNOSIS — F1410 Cocaine abuse, uncomplicated: Secondary | ICD-10-CM | POA: Diagnosis not present

## 2022-02-12 LAB — POTASSIUM: Potassium: 3.5 mmol/L (ref 3.5–5.1)

## 2022-02-12 LAB — GLUCOSE, CAPILLARY
Glucose-Capillary: 77 mg/dL (ref 70–99)
Glucose-Capillary: 165 mg/dL — ABNORMAL HIGH (ref 70–99)
Glucose-Capillary: 135 mg/dL — ABNORMAL HIGH (ref 70–99)
Glucose-Capillary: 171 mg/dL — ABNORMAL HIGH (ref 70–99)

## 2022-02-12 LAB — HIV ANTIBODY (ROUTINE TESTING W REFLEX): HIV Screen 4th Generation wRfx: NONREACTIVE

## 2022-02-12 LAB — MAGNESIUM: Magnesium: 1.6 mg/dL — ABNORMAL LOW (ref 1.7–2.4)

## 2022-02-12 MED ORDER — POTASSIUM CHLORIDE CRYS ER 20 MEQ PO TBCR
40.0000 meq | EXTENDED_RELEASE_TABLET | Freq: Once | ORAL | Status: AC
  Administered 2022-02-12: 40 meq via ORAL
  Filled 2022-02-12: qty 2

## 2022-02-12 MED ORDER — INSULIN GLARGINE-YFGN 100 UNIT/ML ~~LOC~~ SOLN
7.0000 [IU] | Freq: Every day | SUBCUTANEOUS | Status: DC
  Administered 2022-02-12 – 2022-02-15 (×4): 7 [IU] via SUBCUTANEOUS
  Filled 2022-02-12 (×5): qty 0.07

## 2022-02-12 NOTE — Progress Notes (Signed)
Cardiology Progress Note   Patient Name: Molly Howard Date of Encounter: 02/12/2022  Primary Cardiologist: [name], MD  Subjective   Noticing improvement in swelling and breathing though still a long way to go.  She denies chest pain.  Inpatient Medications    Scheduled Meds:  atorvastatin  20 mg Oral Daily   carvedilol  6.25 mg Oral BID WC   dapagliflozin propanediol  10 mg Oral Daily   enoxaparin (LOVENOX) injection  0.5 mg/kg Subcutaneous Q24H   famotidine  20 mg Oral BID   ferrous sulfate  325 mg Oral Q breakfast   furosemide  80 mg Intravenous Q8H   insulin aspart  0-20 Units Subcutaneous TID WC   insulin glargine-yfgn  10 Units Subcutaneous QHS   sacubitril-valsartan  1 tablet Oral BID   sodium chloride flush  3 mL Intravenous Q12H   spironolactone  25 mg Oral Daily   Continuous Infusions:  sodium chloride     PRN Meds: sodium chloride, acetaminophen, guaiFENesin-dextromethorphan, ondansetron (ZOFRAN) IV, sodium chloride flush   Vital Signs    Vitals:   02/11/22 2110 02/11/22 2303 02/12/22 0449 02/12/22 0918  BP: 121/88 (!) 134/90 108/71 120/85  Pulse: 81 81 80 82  Resp: 17 16 16 20   Temp: 98.2 F (36.8 C) 98.3 F (36.8 C) 98 F (36.7 C) 98.4 F (36.9 C)  TempSrc:   Oral   SpO2: 99% 100% 100% 100%  Weight:   110.1 kg   Height:        Intake/Output Summary (Last 24 hours) at 02/12/2022 1117 Last data filed at 02/12/2022 1000 Gross per 24 hour  Intake 3 ml  Output 2100 ml  Net -2097 ml   Filed Weights   02/09/22 1210 02/11/22 0625 02/12/22 0449  Weight: 119.3 kg 113.9 kg 110.1 kg    Physical Exam   GEN: Well nourished, well developed, in no acute distress.  HEENT: Poor dentition. Neck: Supple, no JVD, carotid bruits, or masses. Cardiac: RRR, no murmurs, rubs, or gallops. No clubbing, cyanosis.  2+ lower extremity edema to her hips.  Radials 2+, DP/PT 2+ and equal bilaterally.  Respiratory:  Respirations regular and unlabored,  clear to auscultation bilaterally. GI: Obese, protuberant, firm, nontender, BS + x 4.  Improving flank edema. MS: no deformity or atrophy. Skin: warm and dry, no rash. Neuro:  Strength and sensation are intact. Psych: AAOx3.  Normal affect.  Labs    Chemistry Recent Labs  Lab 02/09/22 1214 02/10/22 0453 02/11/22 0428  NA 137 136 140  K 4.5 4.4 3.8  CL 105 104 108  CO2 26 28 27   GLUCOSE 184* 121* 170*  BUN 24* 26* 26*  CREATININE 1.14* 1.31* 1.29*  CALCIUM 7.6* 7.5* 7.4*  PROT 6.0*  --   --   ALBUMIN 1.8*  --   --   AST 18  --   --   ALT 17  --   --   ALKPHOS 120  --   --   BILITOT 0.7  --   --   GFRNONAA >60 54* 55*  ANIONGAP 6 4* 5     Hematology Recent Labs  Lab 02/09/22 1214 02/10/22 0455  WBC 5.7 6.6  RBC 3.87 3.94  HGB 7.5* 7.5*  HCT 28.4* 28.9*  MCV 73.4* 73.4*  MCH 19.4* 19.0*  MCHC 26.4* 26.0*  RDW 21.9* 22.2*  PLT 473* 491*   BNP    Component Value Date/Time   BNP 859.3 (H) 02/09/2022 1214  Lipids  Lab Results  Component Value Date   CHOL 286 (H) 06/12/2021   HDL 74 06/12/2021   LDLCALC 171 (H) 06/12/2021   TRIG 242 (H) 06/12/2021   CHOLHDL 3.9 06/12/2021    HbA1c  Lab Results  Component Value Date   HGBA1C 9.6 (H) 02/10/2022    Radiology    DG Chest 2 View  Result Date: 02/09/2022 CLINICAL DATA:  Shortness of breath EXAM: CHEST - 2 VIEW COMPARISON:  07/21/2021 FINDINGS: Cardiomegaly, which appears somewhat similar to 11/19/2020 but increased compared to 07/21/2021. Interstitial opacities, likely pulmonary edema. No pleural effusion or pneumothorax. No acute osseous abnormality. IMPRESSION: Cardiomegaly and mild pulmonary edema. Electronically Signed   By: Merilyn Baba M.D.   On: 02/09/2022 13:14    Telemetry    Regular sinus rhythm.  9 and 13 beat runs of nonsustained VT- Personally Reviewed  Cardiac Studies   2D Echocardiogram 5.2023   1. Left ventricular ejection fraction, by estimation, is 25 to 30%. Left  ventricular  ejection fraction by 3D volume is 28 %. The left ventricle has  severely decreased function. The left ventricle demonstrates global  hypokinesis. Left ventricular  diastolic parameters are consistent with Grade II diastolic dysfunction  (pseudonormalization). The average left ventricular global longitudinal  strain is -5.9 %. The global longitudinal strain is abnormal.   2. Right ventricular systolic function is mildly reduced. The right  ventricular size is mildly enlarged. Tricuspid regurgitation signal is  inadequate for assessing PA pressure.   3. The mitral valve is normal in structure. No evidence of mitral valve  regurgitation. No evidence of mitral stenosis.   4. The aortic valve is tricuspid. Aortic valve regurgitation is not  visualized. No aortic stenosis is present.   5. The inferior vena cava is normal in size with greater than 50%  respiratory variability, suggesting right atrial pressure of 3 mmHg.  _____________     Patient Profile     Molly Howard is a 36 y.o. female with a history of HFrEF, nonischemic cardiomyopathy, hypertension, diabetes mellitus type 2, stage II chronic kidney disease, GERD, microcytic anemia, obesity, medication nonadherence, and polysubstance abuse admitted to [facility] on 02/09/22 for massive fluid overload with acute on chronic HFrEF.    Assessment & Plan    1.  Acute on chronic heart failure with reduced ejection fraction/nonischemic cardiomyopathy: Presented to [facility] following progressive dyspnea and weight gain of 77 pounds since April 2023.  She has been receiving intravenous Lasix and responding well.  Minus 1.9L overnight and 6.3L since admission, however intakes have not been recorded accurately (0 on 10/11, 3 ml on 10/12).  Wt down 3 kg overnight.  Labs pending.  She remains markedly volume overloaded on examination.  Pending labs, plan to continue Lasix 80 mg twice daily.  Continue beta-blocker, Entresto, SGLT2 inhibitor, and  spironolactone.  2.  Essential hypertension: Blood pressure stable on beta-blocker, Entresto, spironolactone, and diuretic therapy.  3.  Stage II chronic kidney disease: Labs pending this morning.  4.  History of syncope/nonsustained VT: Prior syncopal spell with echo in May 2023 showing an EF of 25 to 30%.  She continues to have intermittent nonsustained VT (9 and 13 beats over the past 24 hours).  She is asymptomatic.  Continue beta-blocker therapy.  Eventual plan for ICD, if she can show compliance, avoidance of illicit substances, and anemia stabilizes.  May need to consider antiarrhythmic therapy for any increase in burden of VT.  5.  Type 2 diabetes  mellitus: A1c 9.6.  Per internal medicine.  6.  Menorrhagia/microcytic anemia: Status post IV iron infusion on October 11.  Labs pending this morning but hemoglobin has been trending in the 7.5 range.  7.  Polysubstance abuse: Tobacco and cocaine cessation strongly encouraged.  8.  Hypoalbuminemia: Albumin 1.8 on admission.  Consider IV albumin.  Signed, Murray Hodgkins, NP  02/12/2022, 11:17 AM    For questions or updates, please contact   Please consult [email or web address] for contact info under Cardiology/STEMI.

## 2022-02-12 NOTE — Progress Notes (Signed)
   Heart Failure Nurse Navigator Note  Met with patient today, she was sitting up in the chair at bedside.  States that she has been peeing a lot realizes she still has a long way to go.   Discussed being compliant with daily weights, fluid intake, sodium intake medications and abstaining from tobacco and cocaine.  She was given a packet of weight charts instructions written on them to call if weight gain of 2 pounds overnight or 5 pounds within the week.  Also stressed the importance of communicating with the heart failure clinic staff any changes in her weight or symptoms.  And to be compliant and attend those appointments with the heart failure clinic.  She voices understanding.  Pricilla Riffle RN [facility]

## 2022-02-12 NOTE — Progress Notes (Signed)
PROGRESS NOTE    Molly Howard  AST:[id number] DOB: Aug 04, 1985 DOA: 02/09/2022 PCP: [name], DO    Brief Narrative:   Molly Howard is a 36 y.o. female with medical history significant of HFrEF with most recent EF 25-30% secondary to nonischemic cardiomyopathy, type 2 diabetes, hypertension, hyperlipidemia, CKD stage II, chronic anemia who presents to the ED from her cardiologist office for acute heart failure.  10/13 sob improving , not at baseline. No cp  Consultants:  cardiology  Procedures:   Antimicrobials:      Subjective: Still with shortness of breath no chest pain  Objective: Vitals:   02/11/22 1236 02/11/22 2110 02/11/22 2303 02/12/22 0449  BP: 122/79 121/88 (!) 134/90 108/71  Pulse: 85 81 81 80  Resp: 16 17 16 16   Temp: (!) 97.5 F (36.4 C) 98.2 F (36.8 C) 98.3 F (36.8 C) 98 F (36.7 C)  TempSrc:    Oral  SpO2: 100% 99% 100% 100%  Weight:    110.1 kg  Height:        Intake/Output Summary (Last 24 hours) at 02/12/2022 0833 Last data filed at 02/11/2022 2303 Gross per 24 hour  Intake 3 ml  Output 2000 ml  Net -1997 ml   Filed Weights   02/09/22 1210 02/11/22 0625 02/12/22 0449  Weight: 119.3 kg 113.9 kg 110.1 kg    Examination: Calm, NAD Decrease bs, no wheezing Reg s1/s2 no gallop Soft benign +bs Decreasing LE edema Aaoxox3  Mood and affect appropriate in current setting     Data Reviewed: I have personally reviewed following labs and imaging studies  CBC: Recent Labs  Lab 02/09/22 1214 02/10/22 0455  WBC 5.7 6.6  NEUTROABS  --  4.9  HGB 7.5* 7.5*  HCT 28.4* 28.9*  MCV 73.4* 73.4*  PLT 473* 989*   Basic Metabolic Panel: Recent Labs  Lab 02/09/22 1214 02/10/22 0453 02/11/22 0428  NA 137 136 140  K 4.5 4.4 3.8  CL 105 104 108  CO2 26 28 27   GLUCOSE 184* 121* 170*  BUN 24* 26* 26*  CREATININE 1.14* 1.31* 1.29*  CALCIUM 7.6* 7.5* 7.4*  MG 1.7 1.9  --    GFR: Estimated Creatinine Clearance: 76.4 mL/min  (A) (by C-G formula based on SCr of 1.29 mg/dL (H)). Liver Function Tests: Recent Labs  Lab 02/09/22 1214  AST 18  ALT 17  ALKPHOS 120  BILITOT 0.7  PROT 6.0*  ALBUMIN 1.8*   No results for input(s): "LIPASE", "AMYLASE" in the last 168 hours. No results for input(s): "AMMONIA" in the last 168 hours. Coagulation Profile: No results for input(s): "INR", "PROTIME" in the last 168 hours. Cardiac Enzymes: No results for input(s): "CKTOTAL", "CKMB", "CKMBINDEX", "TROPONINI" in the last 168 hours. BNP (last 3 results) No results for input(s): "PROBNP" in the last 8760 hours. HbA1C: Recent Labs    02/10/22 0455  HGBA1C 9.6*   CBG: Recent Labs  Lab 02/11/22 0846 02/11/22 1316 02/11/22 1655 02/11/22 2055 02/11/22 2107  GLUCAP 127* 167* 118* 151* 141*   Lipid Profile: No results for input(s): "CHOL", "HDL", "LDLCALC", "TRIG", "CHOLHDL", "LDLDIRECT" in the last 72 hours. Thyroid Function Tests: No results for input(s): "TSH", "T4TOTAL", "FREET4", "T3FREE", "THYROIDAB" in the last 72 hours. Anemia Panel: Recent Labs    02/09/22 1214  FERRITIN 5*  TIBC 332  IRON 21*   Sepsis Labs: No results for input(s): "PROCALCITON", "LATICACIDVEN" in the last 168 hours.  No results found for this or any previous  visit (from the past 240 hour(s)).       Radiology Studies: No results found.      Scheduled Meds:  atorvastatin  20 mg Oral Daily   carvedilol  6.25 mg Oral BID WC   dapagliflozin propanediol  10 mg Oral Daily   enoxaparin (LOVENOX) injection  0.5 mg/kg Subcutaneous Q24H   famotidine  20 mg Oral BID   ferrous sulfate  325 mg Oral Q breakfast   furosemide  80 mg Intravenous Q8H   insulin aspart  0-20 Units Subcutaneous TID WC   insulin glargine-yfgn  10 Units Subcutaneous QHS   sacubitril-valsartan  1 tablet Oral BID   sodium chloride flush  3 mL Intravenous Q12H   spironolactone  25 mg Oral Daily   Continuous Infusions:  sodium chloride      Assessment  & Plan:   Principal Problem:   Acute on chronic HFrEF (heart failure with reduced ejection fraction) (HCC) Active Problems:   Chronic anemia   Essential hypertension   Diabetes mellitus, type II (HCC)   HLD (hyperlipidemia)   Chronic kidney disease (CKD), stage II (mild)   Syncope   Dilated cardiomyopathy (HCC)   Acute pulmonary edema with congestive heart failure (HCC)   Acute on chronic HFrEF (heart failure with reduced ejection fraction) (HCC) Continue current GDMT including carvedilol, Entresto, spironolactone, and Farxiga Cocaine positive 10/13 clinically improving  continue IV Lasix Cards following  Chronic anemia Patient has a long-term history of chronic anemia secondary to iron deficiency in the setting of chronic blood loss secondary to menorrhagia.  Hemoglobin today is low at 7.5.  Iron panel demonstrates severe iron deficiency. 10/11 s/p iv iron yesterday 10/13 continue iron supplement  Follow up with PCP as outpatient We will check hemoglobin in a.m.   Essential hypertension Stable,  continue current regimen.    Diabetes mellitus, type II ([place]) Uncontrolled. A1c 9.6 BG stable and on low side one episode. Decrease lantus to 7unit qhs   HLD (hyperlipidemia) - Continue home atorvastatin   Chronic kidney disease (CKD), stage II (mild) Renal function at baseline at this time  monitor  Syncope Patient has a history of syncope with the most recent episode approximately 1 month ago.  This is being monitored by her cardiologist but will admit to a telemetry unit while admitted as she is high risk for ventricular arrhythmia.  AICD placement was intended, but delayed due to anemia.  Drug abuse Cocaine positive. Counseled pt about cessation  DVT prophylaxis:lovenox Code Status:full Family Communication: none Disposition Plan:  Status is: Inpatient Remains inpatient appropriate because: iv treatment      LOS: 3 days   Time spent: 35 min    Nolberto Hanlon, MD [facility] Pager [phone]  If 7PM-7AM, please contact night-coverage 02/12/2022, 8:33 AM

## 2022-02-13 DIAGNOSIS — I5023 Acute on chronic systolic (congestive) heart failure: Secondary | ICD-10-CM | POA: Diagnosis not present

## 2022-02-13 DIAGNOSIS — M79671 Pain in right foot: Secondary | ICD-10-CM

## 2022-02-13 DIAGNOSIS — N182 Chronic kidney disease, stage 2 (mild): Secondary | ICD-10-CM | POA: Diagnosis not present

## 2022-02-13 DIAGNOSIS — I420 Dilated cardiomyopathy: Secondary | ICD-10-CM | POA: Diagnosis not present

## 2022-02-13 LAB — ALBUMIN: Albumin: 1.8 g/dL — ABNORMAL LOW (ref 3.5–5.0)

## 2022-02-13 LAB — CBC
HCT: 29.2 % — ABNORMAL LOW (ref 36.0–46.0)
Hemoglobin: 7.7 g/dL — ABNORMAL LOW (ref 12.0–15.0)
MCH: 19.7 pg — ABNORMAL LOW (ref 26.0–34.0)
MCHC: 26.4 g/dL — ABNORMAL LOW (ref 30.0–36.0)
MCV: 74.9 fL — ABNORMAL LOW (ref 80.0–100.0)
Platelets: 504 10*3/uL — ABNORMAL HIGH (ref 150–400)
RBC: 3.9 MIL/uL (ref 3.87–5.11)
RDW: 23.3 % — ABNORMAL HIGH (ref 11.5–15.5)
WBC: 6.5 10*3/uL (ref 4.0–10.5)
nRBC: 1.2 % — ABNORMAL HIGH (ref 0.0–0.2)

## 2022-02-13 LAB — GLUCOSE, CAPILLARY
Glucose-Capillary: 126 mg/dL — ABNORMAL HIGH (ref 70–99)
Glucose-Capillary: 205 mg/dL — ABNORMAL HIGH (ref 70–99)
Glucose-Capillary: 126 mg/dL — ABNORMAL HIGH (ref 70–99)

## 2022-02-13 LAB — BASIC METABOLIC PANEL
Anion gap: 9 (ref 5–15)
BUN: 24 mg/dL — ABNORMAL HIGH (ref 6–20)
CO2: 29 mmol/L (ref 22–32)
Calcium: 7.6 mg/dL — ABNORMAL LOW (ref 8.9–10.3)
Chloride: 103 mmol/L (ref 98–111)
Creatinine, Ser: 1.13 mg/dL — ABNORMAL HIGH (ref 0.44–1.00)
GFR, Estimated: >60 mL/min (ref >60)
Glucose, Bld: 126 mg/dL — ABNORMAL HIGH (ref 70–99)
Potassium: 3.7 mmol/L (ref 3.5–5.1)
Sodium: 141 mmol/L (ref 135–145)

## 2022-02-13 MED ORDER — POTASSIUM CHLORIDE CRYS ER 20 MEQ PO TBCR
20.0000 meq | EXTENDED_RELEASE_TABLET | Freq: Once | ORAL | Status: AC
  Administered 2022-02-13: 20 meq via ORAL
  Filled 2022-02-13: qty 1

## 2022-02-13 MED ORDER — MAGNESIUM SULFATE 4 GM/100ML IV SOLN
4.0000 g | Freq: Once | INTRAVENOUS | Status: AC
  Administered 2022-02-13: 4 g via INTRAVENOUS
  Filled 2022-02-13: qty 100

## 2022-02-13 MED ORDER — POTASSIUM CHLORIDE CRYS ER 20 MEQ PO TBCR
40.0000 meq | EXTENDED_RELEASE_TABLET | Freq: Once | ORAL | Status: AC
  Administered 2022-02-13: 40 meq via ORAL
  Filled 2022-02-13: qty 2

## 2022-02-13 NOTE — Progress Notes (Signed)
PROGRESS NOTE    Molly Howard  [id number] DOB: [birth year] DOA: 02/09/2022 PCP: [name], DO    Brief Narrative:   Molly Howard is a 36 y.o. female with medical history significant of HFrEF with most recent EF 25-30% secondary to nonischemic cardiomyopathy, type 2 diabetes, hypertension, hyperlipidemia, CKD stage II, chronic anemia who presents to the ED from her cardiologist office for acute heart failure.  10/13 sob improving , not at baseline. No cp 10/14 improving slowly, not at baseline with doe, sob, and LE edema. Tele last night with nsvt.   Consultants:  cardiology  Procedures:   Antimicrobials:      Subjective: No cp.  Complaining of right foot anterior pain.  No redness only swelling.  Objective: Vitals:   02/12/22 1950 02/12/22 2311 02/13/22 0411 02/13/22 0413  BP: 129/87 114/78 (!) 132/90   Pulse: 81 80 80   Resp: 18 16 17    Temp: 97.6 F (36.4 C) 98 F (36.7 C) 98.2 F (36.8 C)   TempSrc: Oral Oral Oral   SpO2: 100% 100% 100%   Weight:    106.7 kg  Height:        Intake/Output Summary (Last 24 hours) at 02/13/2022 0935 Last data filed at 02/13/2022 [id number] Gross per 24 hour  Intake 240 ml  Output 4450 ml  Net -4210 ml   Filed Weights   02/11/22 0625 02/12/22 0449 02/13/22 0413  Weight: 113.9 kg 110.1 kg 106.7 kg    Examination: Calm, NAD Decreased breath sounds at bases no wheezing Reg s1/s2 no gallop Soft benign +bs + Lower extremity edema bilaterally, improving.  Right foot no erythema.  Has edema with some tenderness on top of the edema.   Aaoxox3  Mood and affect appropriate in current setting     Data Reviewed: I have personally reviewed following labs and imaging studies  CBC: Recent Labs  Lab 02/09/22 1214 02/10/22 0455 02/13/22 0454  WBC 5.7 6.6 6.5  NEUTROABS  --  4.9  --   HGB 7.5* 7.5* 7.7*  HCT 28.4* 28.9* 29.2*  MCV 73.4* 73.4* 74.9*  PLT 473* 491* [id number]*   Basic Metabolic Panel: Recent Labs  Lab  02/09/22 1214 02/10/22 0453 02/11/22 0428 02/12/22 2011 02/13/22 0454  NA 137 136 140  --  141  K 4.5 4.4 3.8 3.5 3.7  CL 105 104 108  --  103  CO2 26 28 27   --  29  GLUCOSE 184* 121* 170*  --  126*  BUN 24* 26* 26*  --  24*  CREATININE 1.14* 1.31* 1.29*  --  1.13*  CALCIUM 7.6* 7.5* 7.4*  --  7.6*  MG 1.7 1.9  --  1.6*  --    GFR: Estimated Creatinine Clearance: 85.7 mL/min (A) (by C-G formula based on SCr of 1.13 mg/dL (H)). Liver Function Tests: Recent Labs  Lab 02/09/22 1214  AST 18  ALT 17  ALKPHOS 120  BILITOT 0.7  PROT 6.0*  ALBUMIN 1.8*   No results for input(s): "LIPASE", "AMYLASE" in the last 168 hours. No results for input(s): "AMMONIA" in the last 168 hours. Coagulation Profile: No results for input(s): "INR", "PROTIME" in the last 168 hours. Cardiac Enzymes: No results for input(s): "CKTOTAL", "CKMB", "CKMBINDEX", "TROPONINI" in the last 168 hours. BNP (last 3 results) No results for input(s): "PROBNP" in the last 8760 hours. HbA1C: No results for input(s): "HGBA1C" in the last 72 hours.  CBG: Recent Labs  Lab 02/12/22 0920 02/12/22 1200  02/12/22 1554 02/12/22 2118 02/13/22 0745  GLUCAP 77 165* 135* 171* 126*   Lipid Profile: No results for input(s): "CHOL", "HDL", "LDLCALC", "TRIG", "CHOLHDL", "LDLDIRECT" in the last 72 hours. Thyroid Function Tests: No results for input(s): "TSH", "T4TOTAL", "FREET4", "T3FREE", "THYROIDAB" in the last 72 hours. Anemia Panel: No results for input(s): "VITAMINB12", "FOLATE", "FERRITIN", "TIBC", "IRON", "RETICCTPCT" in the last 72 hours.  Sepsis Labs: No results for input(s): "PROCALCITON", "LATICACIDVEN" in the last 168 hours.  No results found for this or any previous visit (from the past 240 hour(s)).       Radiology Studies: No results found.      Scheduled Meds:  atorvastatin  20 mg Oral Daily   carvedilol  6.25 mg Oral BID WC   dapagliflozin propanediol  10 mg Oral Daily   enoxaparin  (LOVENOX) injection  0.5 mg/kg Subcutaneous Q24H   famotidine  20 mg Oral BID   ferrous sulfate  325 mg Oral Q breakfast   furosemide  80 mg Intravenous Q8H   insulin aspart  0-20 Units Subcutaneous TID WC   insulin glargine-yfgn  7 Units Subcutaneous QHS   potassium chloride  20 mEq Oral Once   sacubitril-valsartan  1 tablet Oral BID   sodium chloride flush  3 mL Intravenous Q12H   spironolactone  25 mg Oral Daily   Continuous Infusions:  sodium chloride 250 mL (02/13/22 0053)    Assessment & Plan:   Principal Problem:   Acute on chronic HFrEF (heart failure with reduced ejection fraction) (HCC) Active Problems:   Chronic anemia   Essential hypertension   Diabetes mellitus, type II (HCC)   HLD (hyperlipidemia)   Chronic kidney disease (CKD), stage II (mild)   Syncope   Dilated cardiomyopathy (HCC)   Acute pulmonary edema with congestive heart failure (HCC)   Cocaine abuse ([place])   Acute on chronic HFrEF (heart failure with reduced ejection fraction) ([place]) Continue current GDMT including carvedilol, Entresto, spironolactone, and Farxiga Cocaine positive 10/14 continue IV Lasix.  Her right foot pain is likely due to edema is not consistent with gout. Cardiology following Did have NSVT. Keep magnesium around 2 potassium around 4.  Cardiology will readdress ICD appropriateness in the outpatient setting. Add potassium 40mg  po x1   Chronic anemia Patient has a long-term history of chronic anemia secondary to iron deficiency in the setting of chronic blood loss secondary to menorrhagia.  Hemoglobin today is low at 7.5.  Iron panel demonstrates severe iron deficiency. 10/11 s/p iv iron yesterday 10/13 continue iron supplement  Follow up with PCP as outpatient 10/14 CBC stable    Essential hypertension Stable continue current regimen   Diabetes mellitus, type II ([place]) Uncontrolled. A1c 9.6 BG stable and on low side one episode. Decrease lantus to 7unit qhs 7/14 BG  stable R-ISS   HLD (hyperlipidemia) - Continue home atorvastatin   Chronic kidney disease (CKD), stage II (mild) Renal function at baseline at this time  monitor  Syncope Patient has a history of syncope with the most recent episode approximately 1 month ago.  This is being monitored by her cardiologist but will admit to a telemetry unit while admitted as she is high risk for ventricular arrhythmia.  AICD placement was intended, but delayed due to anemia.  Drug abuse Cocaine positive. Counseled pt about cessation  DVT prophylaxis:lovenox Code Status:full Family Communication: none Disposition Plan:  Status is: Inpatient Remains inpatient appropriate because: iv treatment      LOS: 4 days   Time  spent: 35 min    Nolberto Hanlon, MD [facility] Pager [phone]  If 7PM-7AM, please contact night-coverage 02/13/2022, 9:35 AM

## 2022-02-13 NOTE — Progress Notes (Signed)
Cardiology Progress Note   Patient Name: Molly Howard Date of Encounter: 02/13/2022  Primary Cardiologist: [name], MD  Subjective   Noting good response to diuresis.  No chest pain or dyspnea.  Remains volume overloaded.  Inpatient Medications    Scheduled Meds:  atorvastatin  20 mg Oral Daily   carvedilol  6.25 mg Oral BID WC   dapagliflozin propanediol  10 mg Oral Daily   enoxaparin (LOVENOX) injection  0.5 mg/kg Subcutaneous Q24H   famotidine  20 mg Oral BID   ferrous sulfate  325 mg Oral Q breakfast   furosemide  80 mg Intravenous Q8H   insulin aspart  0-20 Units Subcutaneous TID WC   insulin glargine-yfgn  7 Units Subcutaneous QHS   sacubitril-valsartan  1 tablet Oral BID   sodium chloride flush  3 mL Intravenous Q12H   spironolactone  25 mg Oral Daily   Continuous Infusions:  sodium chloride 250 mL (02/13/22 0053)   PRN Meds: sodium chloride, acetaminophen, guaiFENesin-dextromethorphan, ondansetron (ZOFRAN) IV, sodium chloride flush   Vital Signs    Vitals:   02/12/22 2311 02/13/22 0411 02/13/22 0413 02/13/22 0952  BP: 114/78 (!) 132/90  107/60  Pulse: 80 80  84  Resp: 16 17  18   Temp: 98 F (36.7 C) 98.2 F (36.8 C)    TempSrc: Oral Oral    SpO2: 100% 100%  100%  Weight:   106.7 kg   Height:        Intake/Output Summary (Last 24 hours) at 02/13/2022 1017 Last data filed at 02/13/2022 [id number] Gross per 24 hour  Intake 240 ml  Output 3550 ml  Net -3310 ml   Filed Weights   02/11/22 0625 02/12/22 0449 02/13/22 0413  Weight: 113.9 kg 110.1 kg 106.7 kg    Physical Exam   GEN: Well nourished, well developed, in no acute distress.  HEENT: Grossly normal.  Neck: Supple, no JVD, carotid bruits, or masses. Cardiac: RRR, no murmurs, rubs, or gallops. No clubbing, cyanosis, edema.  Radials 2+, DP/PT 2+ and equal bilaterally.  Respiratory:  Respirations regular and unlabored, clear to auscultation bilaterally. GI: Soft, nontender,  nondistended, BS + x 4. MS: no deformity or atrophy. Skin: warm and dry, no rash. Neuro:  Strength and sensation are intact. Psych: AAOx3.  Normal affect.  Labs    Chemistry Recent Labs  Lab 02/09/22 1214 02/10/22 0453 02/11/22 0428 02/12/22 2011 02/13/22 0454  NA 137 136 140  --  141  K 4.5 4.4 3.8 3.5 3.7  CL 105 104 108  --  103  CO2 26 28 27   --  29  GLUCOSE 184* 121* 170*  --  126*  BUN 24* 26* 26*  --  24*  CREATININE 1.14* 1.31* 1.29*  --  1.13*  CALCIUM 7.6* 7.5* 7.4*  --  7.6*  PROT 6.0*  --   --   --   --   ALBUMIN 1.8*  --   --   --   --   AST 18  --   --   --   --   ALT 17  --   --   --   --   ALKPHOS 120  --   --   --   --   BILITOT 0.7  --   --   --   --   GFRNONAA >60 54* 55*  --  >60  ANIONGAP 6 4* 5  --  9     Hematology  Recent Labs  Lab 02/09/22 1214 02/10/22 0455 02/13/22 0454  WBC 5.7 6.6 6.5  RBC 3.87 3.94 3.90  HGB 7.5* 7.5* 7.7*  HCT 28.4* 28.9* 29.2*  MCV 73.4* 73.4* 74.9*  MCH 19.4* 19.0* 19.7*  MCHC 26.4* 26.0* 26.4*  RDW 21.9* 22.2* 23.3*  PLT 473* 491* 504*    Cardiac Enzymes No results for input(s): "TROPONINIHS" in the last 720 hours.    BNP    Component Value Date/Time   BNP 859.3 (H) 02/09/2022 1214    ProBNP No results found for: "PROBNP"   DDimer No results for input(s): "DDIMER" in the last 168 hours.   Lipids  Lab Results  Component Value Date   CHOL 286 (H) 06/12/2021   HDL 74 06/12/2021   LDLCALC 171 (H) 06/12/2021   TRIG 242 (H) 06/12/2021   CHOLHDL 3.9 06/12/2021    HbA1c  Lab Results  Component Value Date   HGBA1C 9.6 (H) 02/10/2022    Radiology    DG Chest 2 View  Result Date: 02/09/2022 CLINICAL DATA:  Shortness of breath EXAM: CHEST - 2 VIEW COMPARISON:  07/21/2021 FINDINGS: Cardiomegaly, which appears somewhat similar to 11/19/2020 but increased compared to 07/21/2021. Interstitial opacities, likely pulmonary edema. No pleural effusion or pneumothorax. No acute osseous abnormality.  IMPRESSION: Cardiomegaly and mild pulmonary edema. Electronically Signed   By: Merilyn Baba M.D.   On: 02/09/2022 13:14    Telemetry    Regular sinus rhythm, PVCs, 9 and 13 beat runs of nonsustained VT- Personally Reviewed  Cardiac Studies   2D Echocardiogram 5.2023   1. Left ventricular ejection fraction, by estimation, is 25 to 30%. Left  ventricular ejection fraction by 3D volume is 28 %. The left ventricle has  severely decreased function. The left ventricle demonstrates global  hypokinesis. Left ventricular  diastolic parameters are consistent with Grade II diastolic dysfunction  (pseudonormalization). The average left ventricular global longitudinal  strain is -5.9 %. The global longitudinal strain is abnormal.   2. Right ventricular systolic function is mildly reduced. The right  ventricular size is mildly enlarged. Tricuspid regurgitation signal is  inadequate for assessing PA pressure.   3. The mitral valve is normal in structure. No evidence of mitral valve  regurgitation. No evidence of mitral stenosis.   4. The aortic valve is tricuspid. Aortic valve regurgitation is not  visualized. No aortic stenosis is present.   5. The inferior vena cava is normal in size with greater than 50%  respiratory variability, suggesting right atrial pressure of 3 mmHg.  _____________   Patient Profile     Molly Howard is a 36 y.o. female with a history of HFrEF, nonischemic cardiomyopathy, hypertension, diabetes mellitus type 2, stage II chronic kidney disease, GERD, microcytic anemia, obesity, medication nonadherence, and polysubstance abuse admitted to [facility] on 02/09/22 for massive fluid overload with acute on chronic HFrEF.     Assessment & Plan    1.  Acute on chronic heart failure with reduced ejection fraction/nonischemic cardiomyopathy: Presented to [facility] following progressive dyspnea waking up 77 pounds since April 2023.  She continues to diurese well and was -4.2 L yesterday, and  10.5 L since admission.  Weight down to 106.7 kg (admission weight 119.3 kg; dry weight 84.4 kg in April 2023).  Creatinine remained stable at 1.13.  She remains markedly volume overloaded.  Continue Lasix 80 mg every 8 hours.  Continue beta-blocker, Entresto, SGLT2 inhibitor, and spironolactone.  Outpatient compliance of utmost importance.  2.  Essential  hypertension: Blood pressure stable on beta-blocker, Entresto, spironolactone, and diuretic therapy.  3.  Stage II chronic kidney disease: Stable.  4.  History of syncope/nonsustained VT: Prior syncopal spell with echo May 2023 showed an EF of 25 to 30%.  Previously advised to undergo subcu ICD placement however this was deferred secondary to anemia.  Continues to have intermittent brief spells of nonsustained VT-9 and 13 beats over the past 24 hours.  Continue beta-blocker therapy.  We will readdress ICD appropriateness in the outpatient setting.  5.  Type 2 diabetes mellitus: A1c 9.6.  Per internal medicine.  6.  Menorrhagia/microcytic anemia: Status post IV iron infusion October 11.  H&H stable but remain low at 7.7/29.2.  Previous evaluated by GYN with recommendation to start oral contraceptives.  Defer to medicine.  7.  Polysubstance use: Tobacco and some cocaine cessation strongly encouraged.  8.  Hypoalbuminemia: Albumin 1.8 admission.  Follow-up.  Signed, Murray Hodgkins, NP  02/13/2022, 10:17 AM    For questions or updates, please contact   Please consult [email or web address] for contact info under Cardiology/STEMI.

## 2022-02-14 DIAGNOSIS — I420 Dilated cardiomyopathy: Secondary | ICD-10-CM | POA: Diagnosis not present

## 2022-02-14 DIAGNOSIS — I5023 Acute on chronic systolic (congestive) heart failure: Secondary | ICD-10-CM | POA: Diagnosis not present

## 2022-02-14 LAB — RENAL FUNCTION PANEL
Albumin: 1.9 g/dL — ABNORMAL LOW (ref 3.5–5.0)
Anion gap: 7 (ref 5–15)
BUN: 25 mg/dL — ABNORMAL HIGH (ref 6–20)
CO2: 31 mmol/L (ref 22–32)
Calcium: 7.7 mg/dL — ABNORMAL LOW (ref 8.9–10.3)
Chloride: 102 mmol/L (ref 98–111)
Creatinine, Ser: 1.26 mg/dL — ABNORMAL HIGH (ref 0.44–1.00)
GFR, Estimated: 56 mL/min — ABNORMAL LOW (ref >60)
Glucose, Bld: 178 mg/dL — ABNORMAL HIGH (ref 70–99)
Phosphorus: 4.6 mg/dL (ref 2.5–4.6)
Potassium: 4.1 mmol/L (ref 3.5–5.1)
Sodium: 140 mmol/L (ref 135–145)

## 2022-02-14 LAB — GLUCOSE, CAPILLARY
Glucose-Capillary: 157 mg/dL — ABNORMAL HIGH (ref 70–99)
Glucose-Capillary: 183 mg/dL — ABNORMAL HIGH (ref 70–99)
Glucose-Capillary: 172 mg/dL — ABNORMAL HIGH (ref 70–99)
Glucose-Capillary: 216 mg/dL — ABNORMAL HIGH (ref 70–99)

## 2022-02-14 LAB — MAGNESIUM: Magnesium: 2 mg/dL (ref 1.7–2.4)

## 2022-02-14 NOTE — Progress Notes (Signed)
PROGRESS NOTE    Molly Howard  JQB:[id number] DOB: Jan 06, 1986 DOA: 02/09/2022 PCP: [name], DO    Brief Narrative:   DEAYSIA GRIGORYAN is a 36 y.o. female with medical history significant of HFrEF with most recent EF 25-30% secondary to nonischemic cardiomyopathy, type 2 diabetes, hypertension, hyperlipidemia, CKD stage II, chronic anemia who presents to the ED from her cardiologist office for acute heart failure.  10/13 sob improving , not at baseline. No cp 10/14 improving slowly, not at baseline with doe, sob, and LE edema. Tele last night with nsvt.  10/15 no overnight issues  Consultants:  cardiology  Procedures:   Antimicrobials:      Subjective: Starting to feel better.  Less short of breath.  Decreased lower extremity edema.  Objective: Vitals:   02/14/22 0010 02/14/22 0401 02/14/22 0403 02/14/22 0741  BP: 114/87 126/89  120/89  Pulse: 82 79  83  Resp: 20 16  16   Temp: 98.1 F (36.7 C) 98.1 F (36.7 C)  98 F (36.7 C)  TempSrc: Oral Oral    SpO2: 100% 100%  100%  Weight:   104.3 kg   Height:        Intake/Output Summary (Last 24 hours) at 02/14/2022 0918 Last data filed at 02/14/2022 0330 Gross per 24 hour  Intake 12.74 ml  Output 1800 ml  Net -1787.26 ml   Filed Weights   02/12/22 0449 02/13/22 0413 02/14/22 0403  Weight: 110.1 kg 106.7 kg 104.3 kg    Examination: Calm, NAD Decreased breath sounds no wheezing Reg s1/s2 no gallop Soft benign +bs + Lower extremity edema bilaterally but improved with Aaoxox3  Mood and affect appropriate in current setting     Data Reviewed: I have personally reviewed following labs and imaging studies  CBC: Recent Labs  Lab 02/09/22 1214 02/10/22 0455 02/13/22 0454  WBC 5.7 6.6 6.5  NEUTROABS  --  4.9  --   HGB 7.5* 7.5* 7.7*  HCT 28.4* 28.9* 29.2*  MCV 73.4* 73.4* 74.9*  PLT 473* 491* 409*   Basic Metabolic Panel: Recent Labs  Lab 02/09/22 1214 02/10/22 0453 02/11/22 0428  02/12/22 2011 02/13/22 0454 02/14/22 0438 02/14/22 0444  NA 137 136 140  --  141  --  140  K 4.5 4.4 3.8 3.5 3.7  --  4.1  CL 105 104 108  --  103  --  102  CO2 26 28 27   --  29  --  31  GLUCOSE 184* 121* 170*  --  126*  --  178*  BUN 24* 26* 26*  --  24*  --  25*  CREATININE 1.14* 1.31* 1.29*  --  1.13*  --  1.26*  CALCIUM 7.6* 7.5* 7.4*  --  7.6*  --  7.7*  MG 1.7 1.9  --  1.6*  --  2.0  --   PHOS  --   --   --   --   --   --  4.6   GFR: Estimated Creatinine Clearance: 76 mL/min (A) (by C-G formula based on SCr of 1.26 mg/dL (H)). Liver Function Tests: Recent Labs  Lab 02/09/22 1214 02/13/22 0452 02/14/22 0444  AST 18  --   --   ALT 17  --   --   ALKPHOS 120  --   --   BILITOT 0.7  --   --   PROT 6.0*  --   --   ALBUMIN 1.8* 1.8* 1.9*   No results  for input(s): "LIPASE", "AMYLASE" in the last 168 hours. No results for input(s): "AMMONIA" in the last 168 hours. Coagulation Profile: No results for input(s): "INR", "PROTIME" in the last 168 hours. Cardiac Enzymes: No results for input(s): "CKTOTAL", "CKMB", "CKMBINDEX", "TROPONINI" in the last 168 hours. BNP (last 3 results) No results for input(s): "PROBNP" in the last 8760 hours. HbA1C: No results for input(s): "HGBA1C" in the last 72 hours.  CBG: Recent Labs  Lab 02/12/22 2118 02/13/22 0745 02/13/22 1152 02/13/22 1639 02/14/22 0740  GLUCAP 171* 126* 205* 126* 157*   Lipid Profile: No results for input(s): "CHOL", "HDL", "LDLCALC", "TRIG", "CHOLHDL", "LDLDIRECT" in the last 72 hours. Thyroid Function Tests: No results for input(s): "TSH", "T4TOTAL", "FREET4", "T3FREE", "THYROIDAB" in the last 72 hours. Anemia Panel: No results for input(s): "VITAMINB12", "FOLATE", "FERRITIN", "TIBC", "IRON", "RETICCTPCT" in the last 72 hours.  Sepsis Labs: No results for input(s): "PROCALCITON", "LATICACIDVEN" in the last 168 hours.  No results found for this or any previous visit (from the past 240 hour(s)).        Radiology Studies: No results found.      Scheduled Meds:  atorvastatin  20 mg Oral Daily   carvedilol  6.25 mg Oral BID WC   dapagliflozin propanediol  10 mg Oral Daily   enoxaparin (LOVENOX) injection  0.5 mg/kg Subcutaneous Q24H   famotidine  20 mg Oral BID   ferrous sulfate  325 mg Oral Q breakfast   furosemide  80 mg Intravenous Q8H   insulin aspart  0-20 Units Subcutaneous TID WC   insulin glargine-yfgn  7 Units Subcutaneous QHS   sacubitril-valsartan  1 tablet Oral BID   sodium chloride flush  3 mL Intravenous Q12H   spironolactone  25 mg Oral Daily   Continuous Infusions:  sodium chloride Stopped (02/13/22 0410)    Assessment & Plan:   Principal Problem:   Acute on chronic HFrEF (heart failure with reduced ejection fraction) (HCC) Active Problems:   Chronic anemia   Essential hypertension   Diabetes mellitus, type II (HCC)   HLD (hyperlipidemia)   Chronic kidney disease (CKD), stage II (mild)   Syncope   Dilated cardiomyopathy (HCC)   Acute pulmonary edema with congestive heart failure (HCC)   Cocaine abuse ([place])   Acute on chronic HFrEF (heart failure with reduced ejection fraction) ([place]) Continue current GDMT including carvedilol, Entresto, spironolactone, and Farxiga Cocaine positive 10/14 continue IV Lasix.  Her right foot pain is likely due to edema is not consistent with gout. Cardiology following Did have NSVT. Keep magnesium around 2 potassium around 4.  Cardiology will readdress ICD appropriateness in the outpatient setting. 10/15 clinically improving but still volume overloaded Continue IV Lasix   Chronic anemia Patient has a long-term history of chronic anemia secondary to iron deficiency in the setting of chronic blood loss secondary to menorrhagia.  Hemoglobin today is low at 7.5.  Iron panel demonstrates severe iron deficiency. 10/11 s/p iv iron yesterday 10/13 continue iron supplement  Follow up with PCP as outpatient 10/15  CBC stable    Essential hypertension Stable Continue current regimen    Diabetes mellitus, type II ([place]) Uncontrolled. A1c 9.6 BG stable and on low side one episode. Decrease lantus to 7unit qhs 10/15 BG stable R-ISS   HLD (hyperlipidemia) - Continue home atorvastatin   Chronic kidney disease (CKD), stage II (mild) Renal function at baseline at this time  monitor  Syncope Patient has a history of syncope with the most recent episode approximately  1 month ago.  This is being monitored by her cardiologist but will admit to a telemetry unit while admitted as she is high risk for ventricular arrhythmia.  AICD placement was intended, but delayed due to anemia.  Drug abuse Cocaine positive. Counseled pt about cessation  DVT prophylaxis:lovenox Code Status:full Family Communication: none Disposition Plan:  Status is: Inpatient Remains inpatient appropriate because: iv treatment      LOS: 5 days   Time spent: 35 min    Nolberto Hanlon, MD [facility] Pager [phone]  If 7PM-7AM, please contact night-coverage 02/14/2022, 9:18 AM

## 2022-02-14 NOTE — Progress Notes (Signed)
Rounding Note    Patient Name: Molly Howard Date of Encounter: 02/14/2022  Icard Cardiologist: [name], MD   Subjective   No acute events overnight. R foot pain similar, not worse. Remains volume overloaded  Inpatient Medications    Scheduled Meds:  atorvastatin  20 mg Oral Daily   carvedilol  6.25 mg Oral BID WC   dapagliflozin propanediol  10 mg Oral Daily   enoxaparin (LOVENOX) injection  0.5 mg/kg Subcutaneous Q24H   famotidine  20 mg Oral BID   ferrous sulfate  325 mg Oral Q breakfast   furosemide  80 mg Intravenous Q8H   insulin aspart  0-20 Units Subcutaneous TID WC   insulin glargine-yfgn  7 Units Subcutaneous QHS   sacubitril-valsartan  1 tablet Oral BID   sodium chloride flush  3 mL Intravenous Q12H   spironolactone  25 mg Oral Daily   Continuous Infusions:  sodium chloride Stopped (02/13/22 0410)   PRN Meds: sodium chloride, acetaminophen, guaiFENesin-dextromethorphan, ondansetron (ZOFRAN) IV, sodium chloride flush   Vital Signs    Vitals:   02/14/22 0401 02/14/22 0403 02/14/22 0741 02/14/22 1200  BP: 126/89  120/89 118/89  Pulse: 79  83 79  Resp: 16  16 17   Temp: 98.1 F (36.7 C)  98 F (36.7 C)   TempSrc: Oral     SpO2: 100%  100% 100%  Weight:  104.3 kg    Height:        Intake/Output Summary (Last 24 hours) at 02/14/2022 1313 Last data filed at 02/14/2022 0330 Gross per 24 hour  Intake 12.74 ml  Output 1800 ml  Net -1787.26 ml      02/14/2022    4:03 AM 02/13/2022    4:13 AM 02/12/2022    4:49 AM  Last 3 Weights  Weight (lbs) 229 lb 15 oz 235 lb 3.7 oz 242 lb 11.2 oz  Weight (kg) 104.3 kg 106.7 kg 110.088 kg      Telemetry    SR, intermittent PVCs - Personally Reviewed  ECG    No new - Personally Reviewed  Physical Exam   GEN: No acute distress.   Neck: No JVD visualized at 90 degrees Cardiac: RRR, no murmurs, rubs, or gallops.  Respiratory: Clear to auscultation bilaterally. GI: Soft,  nontender, non-distended  MS: bilateral 1+ pitting LE edema; No deformity. Neuro:  Nonfocal  Psych: Normal affect   Labs    High Sensitivity Troponin:  No results for input(s): "TROPONINIHS" in the last 720 hours.   Chemistry Recent Labs  Lab 02/09/22 1214 02/10/22 0453 02/11/22 0428 02/12/22 2011 02/13/22 0452 02/13/22 0454 02/14/22 0438 02/14/22 0444  NA 137 136 140  --   --  141  --  140  K 4.5 4.4 3.8 3.5  --  3.7  --  4.1  CL 105 104 108  --   --  103  --  102  CO2 26 28 27   --   --  29  --  31  GLUCOSE 184* 121* 170*  --   --  126*  --  178*  BUN 24* 26* 26*  --   --  24*  --  25*  CREATININE 1.14* 1.31* 1.29*  --   --  1.13*  --  1.26*  CALCIUM 7.6* 7.5* 7.4*  --   --  7.6*  --  7.7*  MG 1.7 1.9  --  1.6*  --   --  2.0  --  PROT 6.0*  --   --   --   --   --   --   --   ALBUMIN 1.8*  --   --   --  1.8*  --   --  1.9*  AST 18  --   --   --   --   --   --   --   ALT 17  --   --   --   --   --   --   --   ALKPHOS 120  --   --   --   --   --   --   --   BILITOT 0.7  --   --   --   --   --   --   --   GFRNONAA >60 54* 55*  --   --  >60  --  56*  ANIONGAP 6 4* 5  --   --  9  --  7    Lipids No results for input(s): "CHOL", "TRIG", "HDL", "LABVLDL", "LDLCALC", "CHOLHDL" in the last 168 hours.  Hematology Recent Labs  Lab 02/09/22 1214 02/10/22 0455 02/13/22 0454  WBC 5.7 6.6 6.5  RBC 3.87 3.94 3.90  HGB 7.5* 7.5* 7.7*  HCT 28.4* 28.9* 29.2*  MCV 73.4* 73.4* 74.9*  MCH 19.4* 19.0* 19.7*  MCHC 26.4* 26.0* 26.4*  RDW 21.9* 22.2* 23.3*  PLT 473* 491* 504*   Thyroid No results for input(s): "TSH", "FREET4" in the last 168 hours.  BNP Recent Labs  Lab 02/09/22 1214  BNP 859.3*    DDimer No results for input(s): "DDIMER" in the last 168 hours.   Radiology    No results found.  Cardiac Studies   Echo 09/18/21  1. Left ventricular ejection fraction, by estimation, is 25 to 30%. Left  ventricular ejection fraction by 3D volume is 28 %. The left ventricle  has  severely decreased function. The left ventricle demonstrates global  hypokinesis. Left ventricular  diastolic parameters are consistent with Grade II diastolic dysfunction  (pseudonormalization). The average left ventricular global longitudinal  strain is -5.9 %. The global longitudinal strain is abnormal.   2. Right ventricular systolic function is mildly reduced. The right  ventricular size is mildly enlarged. Tricuspid regurgitation signal is  inadequate for assessing PA pressure.   3. The mitral valve is normal in structure. No evidence of mitral valve  regurgitation. No evidence of mitral stenosis.   4. The aortic valve is tricuspid. Aortic valve regurgitation is not  visualized. No aortic stenosis is present.   5. The inferior vena cava is normal in size with greater than 50%  respiratory variability, suggesting right atrial pressure of 3 mmHg.    Patient Profile     36 y.o. female with chronic systolic and diastolic heart failure, nonischemic cardiomyopathy, hypertension, type II diabetes presenting with acute on chronic volume overload 2/2 systolic and diastolic heart failure.  Assessment & Plan    Acute on chronic systolic and diastolic heart failure Nonischemic cardiomyopathy -admission weight 119.3 kg, dry weight 84.4 kg 08/2021. Weight today 104.3 kg -still volume overloaded -continue IV lasix -continue beta blocker, entresto, SGLT2i, spironolactone -Cr stable at 1.26 today -hypoalbuminemia likely contributing to edema as well  Hypertension: BP stable on above meds  Type II diabetes: on SGLT2i  Chronic microcytic anemia: stable, per primary team  Substance abuse: cocaine positive on admission  For questions or updates, please contact [place] Please consult [email or web address]  for contact info under        Signed, Buford Dresser, MD  02/14/2022, 1:13 PM

## 2022-02-15 ENCOUNTER — Other Ambulatory Visit (HOSPITAL_COMMUNITY): Payer: Self-pay

## 2022-02-15 ENCOUNTER — Telehealth (HOSPITAL_COMMUNITY): Payer: Self-pay | Admitting: Pharmacy Technician

## 2022-02-15 LAB — BASIC METABOLIC PANEL
Anion gap: 8 (ref 5–15)
BUN: 26 mg/dL — ABNORMAL HIGH (ref 6–20)
CO2: 32 mmol/L (ref 22–32)
Calcium: 7.9 mg/dL — ABNORMAL LOW (ref 8.9–10.3)
Chloride: 99 mmol/L (ref 98–111)
Creatinine, Ser: 1.28 mg/dL — ABNORMAL HIGH (ref 0.44–1.00)
GFR, Estimated: 55 mL/min — ABNORMAL LOW (ref >60)
Glucose, Bld: 190 mg/dL — ABNORMAL HIGH (ref 70–99)
Potassium: 4.4 mmol/L (ref 3.5–5.1)
Sodium: 139 mmol/L (ref 135–145)

## 2022-02-15 LAB — GLUCOSE, CAPILLARY
Glucose-Capillary: 211 mg/dL — ABNORMAL HIGH (ref 70–99)
Glucose-Capillary: 133 mg/dL — ABNORMAL HIGH (ref 70–99)
Glucose-Capillary: 207 mg/dL — ABNORMAL HIGH (ref 70–99)
Glucose-Capillary: 212 mg/dL — ABNORMAL HIGH (ref 70–99)

## 2022-02-15 NOTE — Inpatient Diabetes Management (Addendum)
Inpatient Diabetes Program Recommendations  AACE/ADA: New Consensus Statement on Inpatient Glycemic Control (2015)  Target Ranges:  Prepandial:   less than 140 mg/dL      Peak postprandial:   less than 180 mg/dL (1-2 hours)      Critically ill patients:  140 - 180 mg/dL   Lab Results  Component Value Date   GLUCAP 211 (H) 02/15/2022   HGBA1C 9.6 (H) 02/10/2022    Latest Reference Range & Units 02/14/22 07:40 02/14/22 11:59 02/14/22 16:38 02/14/22 20:08 02/15/22 07:45  Glucose-Capillary 70 - 99 mg/dL 157 (H) 183 (H) 172 (H) 216 (H) 211 (H)  (H): Data is abnormally high  Diabetes history: DM2 Outpatient Diabetes medications: Lantus 15 units BID, Farxiga 10 mg QD Current orders for Inpatient glycemic control: Semglee 7 units QD, Novolog 0-20 units TID, Farxiga 10 mg QD  Inpatient Diabetes Program Recommendations:   Fasting CBG 211. Patient's eating has improved. Please consider: -Increase Semglee to 10 units q hs -Add Novolog 0-5 units hs correction  Thank you, Molly Roys E. Uzoma Vivona, RN, MSN, CDE  Diabetes Coordinator Inpatient Glycemic Control Team Team Pager [phone] (8am-5pm) 02/15/2022 10:35 AM

## 2022-02-15 NOTE — Progress Notes (Signed)
Rounding Note    Patient Name: Molly Howard Date of Encounter: 02/15/2022  [place] Cardiologist: [name], MD   Subjective   Patient seen on a.m. rounds.  Denies any chest pain, or worsening shortness of breath.  Endorses improvement in edema.  -1.2 output in the last 24 hours.  Inpatient Medications    Scheduled Meds:  atorvastatin  20 mg Oral Daily   carvedilol  6.25 mg Oral BID WC   dapagliflozin propanediol  10 mg Oral Daily   enoxaparin (LOVENOX) injection  0.5 mg/kg Subcutaneous Q24H   famotidine  20 mg Oral BID   ferrous sulfate  325 mg Oral Q breakfast   furosemide  80 mg Intravenous Q8H   insulin aspart  0-20 Units Subcutaneous TID WC   insulin glargine-yfgn  7 Units Subcutaneous QHS   sacubitril-valsartan  1 tablet Oral BID   sodium chloride flush  3 mL Intravenous Q12H   spironolactone  25 mg Oral Daily   Continuous Infusions:  sodium chloride Stopped (02/13/22 0410)   PRN Meds: sodium chloride, acetaminophen, guaiFENesin-dextromethorphan, ondansetron (ZOFRAN) IV, sodium chloride flush   Vital Signs    Vitals:   02/15/22 0528 02/15/22 0745 02/15/22 1159 02/15/22 1450  BP: 122/85 (!) 131/92 (!) 130/93 (!) 114/57  Pulse: 90 87 86 87  Resp: 17 17    Temp:  98.2 F (36.8 C)    TempSrc:      SpO2: 99% 100% 100% 100%  Weight:      Height:        Intake/Output Summary (Last 24 hours) at 02/15/2022 1510 Last data filed at 02/15/2022 1425 Gross per 24 hour  Intake 483 ml  Output 1700 ml  Net -1217 ml      02/15/2022    5:27 AM 02/14/2022    4:03 AM 02/13/2022    4:13 AM  Last 3 Weights  Weight (lbs) 223 lb 11.2 oz 229 lb 15 oz 235 lb 3.7 oz  Weight (kg) 101.47 kg 104.3 kg 106.7 kg      Telemetry    Sinus rhythm with rate 80-90, 4 beat run of nonsustained V. tach noted on telemetry overnight.- Personally Reviewed  ECG    No new tracings- Personally Reviewed  Physical Exam   GEN: No acute distress.   Neck: No  JVD Cardiac: RRR, no murmurs, rubs, or gallops.  Respiratory: Clear to auscultation bilaterally. GI: Soft, nontender, non-distended  MS: 1+ pitting edema to BLE; No deformity. Neuro:  Nonfocal  Psych: Normal affect   Labs    High Sensitivity Troponin:  No results for input(s): "TROPONINIHS" in the last 720 hours.   Chemistry Recent Labs  Lab 02/09/22 1214 02/10/22 0453 02/11/22 0428 02/12/22 2011 02/13/22 0452 02/13/22 0454 02/14/22 0438 02/14/22 0444 02/15/22 0922  NA 137 136   < >  --   --  141  --  140 139  K 4.5 4.4   < > 3.5  --  3.7  --  4.1 4.4  CL 105 104   < >  --   --  103  --  102 99  CO2 26 28   < >  --   --  29  --  31 32  GLUCOSE 184* 121*   < >  --   --  126*  --  178* 190*  BUN 24* 26*   < >  --   --  24*  --  25* 26*  CREATININE  1.14* 1.31*   < >  --   --  1.13*  --  1.26* 1.28*  CALCIUM 7.6* 7.5*   < >  --   --  7.6*  --  7.7* 7.9*  MG 1.7 1.9  --  1.6*  --   --  2.0  --   --   PROT 6.0*  --   --   --   --   --   --   --   --   ALBUMIN 1.8*  --   --   --  1.8*  --   --  1.9*  --   AST 18  --   --   --   --   --   --   --   --   ALT 17  --   --   --   --   --   --   --   --   ALKPHOS 120  --   --   --   --   --   --   --   --   BILITOT 0.7  --   --   --   --   --   --   --   --   GFRNONAA >60 54*   < >  --   --  >60  --  56* 55*  ANIONGAP 6 4*   < >  --   --  9  --  7 8   < > = values in this interval not displayed.    Lipids No results for input(s): "CHOL", "TRIG", "HDL", "LABVLDL", "LDLCALC", "CHOLHDL" in the last 168 hours.  Hematology Recent Labs  Lab 02/09/22 1214 02/10/22 0455 02/13/22 0454  WBC 5.7 6.6 6.5  RBC 3.87 3.94 3.90  HGB 7.5* 7.5* 7.7*  HCT 28.4* 28.9* 29.2*  MCV 73.4* 73.4* 74.9*  MCH 19.4* 19.0* 19.7*  MCHC 26.4* 26.0* 26.4*  RDW 21.9* 22.2* 23.3*  PLT 473* 491* 504*   Thyroid No results for input(s): "TSH", "FREET4" in the last 168 hours.  BNP Recent Labs  Lab 02/09/22 1214  BNP 859.3*    DDimer No results for  input(s): "DDIMER" in the last 168 hours.   Radiology    No results found.  Cardiac Studies   Echo 09/18/21  1. Left ventricular ejection fraction, by estimation, is 25 to 30%. Left  ventricular ejection fraction by 3D volume is 28 %. The left ventricle has  severely decreased function. The left ventricle demonstrates global  hypokinesis. Left ventricular  diastolic parameters are consistent with Grade II diastolic dysfunction  (pseudonormalization). The average left ventricular global longitudinal  strain is -5.9 %. The global longitudinal strain is abnormal.   2. Right ventricular systolic function is mildly reduced. The right  ventricular size is mildly enlarged. Tricuspid regurgitation signal is  inadequate for assessing PA pressure.   3. The mitral valve is normal in structure. No evidence of mitral valve  regurgitation. No evidence of mitral stenosis.   4. The aortic valve is tricuspid. Aortic valve regurgitation is not  visualized. No aortic stenosis is present.   5. The inferior vena cava is normal in size with greater than 50%  respiratory variability, suggesting right atrial pressure of 3 mmHg.   Patient Profile     36 y.o. female with a history of chronic systolic and diastolic congestive heart failure, nonischemic cardiomyopathy, hypertension, type 2 diabetes, who has been seen and evaluated for acute  on chronic volume overload secondary to systolic and diastolic heart failure.  Assessment & Plan    Acute on chronic systolic and diastolic heart failure/nonischemic cardiomyopathy -BNP 859.3 --1.2 L output in the last 24 hours -Weight is down approximately 3 kg overnight -Remains volume up -Continue Lasix 80 mg twice daily -Continue beta-blocker, Entresto, SGLT2 inhibitor and spironolactone -Daily weight, I&O, low-sodium diet  Essential hypertension -Blood pressure 114/57 -No changes made to medication regimen today -Vital signs per unit protocol  3.   Stage II  chronic kidney disease -Serum creatinine 1.28 -Remains stable, yesterday 1.26 -Daily BMP -Monitor urine output -Monitor/trend/replace electrolytes as needed -Avoid increasing/adding nephrotoxic agents were able  4.   History of syncope/nonsustained V. Tach -Prior syncopal spell with echo in May 2023 showing an EF of 25-30% -Continues with intermittent nonsustained V. tach episodes -Continues to remain asymptomatic -She has been continued on beta-blocker therapy, blood pressure soft with the inability to escalate -Possible ICD placement if she can show compliance with medication and avoidance of illicit substances  5.   Type 2 diabetes -A1c 9.6 -Management per IM  6.   Menorrhagia/microcytic anemia -Previously had received iron infusion -Hemoglobin 7.7 on 02/13/2022 -Daily CBC -Monitor/trend/replace or transfuse to keep hemoglobin 8 or better with history of acute on chronic systolic and diastolic heart failure and nonsustained VT  7.   Polysubstance abuse -Tobacco and cocaine cessation strongly recommended     For questions or updates, please contact [place] Please consult [email or web address] for contact info under        Signed, Nasim Garofano, NP  02/15/2022, 3:10 PM

## 2022-02-15 NOTE — TOC Initial Note (Signed)
Transition of Care [facility]) - Initial/Assessment Note    Patient Details  Name: Molly Howard MRN: [id number] Date of Birth: 1985/05/05  [facility]) CM/SW Contact:    Candie Chroman, LCSW Phone Number: 02/15/2022, 2:36 PM  Clinical Narrative:   Readmission prevention screen complete. CSW met with patient. No supports at bedside. CSW introduced role and explained that discharge planning would be discussed. PCP is [name], DO at [place]. Patient uses Medicaid Transportation to get to appointments. She uses [facility] but reports issues affording copays at times. Told her about act that was passed regarding inability to afford copays. Patient stated she may need meds to bed service and for [facility] dept to pay for her copays. Explained that we are no longer allowed to do that. Sent secure chat to pharmacist.    No home health prior to admission but she is involved in the paramedicine program. No DME use prior to admission. No further concerns. CSW encouraged patient to contact CSW as needed. CSW will continue to follow patient for support and facilitate return home when stable. Her friend Anderson [place] will transport her home at discharge.            Expected Discharge Plan: Home/Self Care Barriers to Discharge: Continued Medical Work up   Patient Goals and CMS Choice   CMS Medicare.gov Compare Post Acute Care list provided to::  (N/A) Choice offered to / list presented to : NA  Expected Discharge Plan and Services Expected Discharge Plan: Home/Self Care     Post Acute Care Choice: NA Living arrangements for the past 2 months: Single Family Home                                      Prior Living Arrangements/Services Living arrangements for the past 2 months: Single Family Home   Patient language and need for interpreter reviewed:: Yes Do you feel safe going back to the place where you live?: Yes      Need for Family Participation in Patient Care: Yes  (Comment)     Criminal Activity/Legal Involvement Pertinent to Current Situation/Hospitalization: No - Comment as needed  Activities of Daily Living Home Assistive Devices/Equipment: None ADL Screening (condition at time of admission) Patient's cognitive ability adequate to safely complete daily activities?: Yes Is the patient deaf or have difficulty hearing?: No Does the patient have difficulty seeing, even when wearing glasses/contacts?: No Does the patient have difficulty concentrating, remembering, or making decisions?: No Patient able to express need for assistance with ADLs?: Yes Does the patient have difficulty dressing or bathing?: No Independently performs ADLs?: Yes (appropriate for developmental age) Does the patient have difficulty walking or climbing stairs?: No Weakness of Legs: Both Weakness of Arms/Hands: None  Permission Sought/Granted                  Emotional Assessment Appearance:: Appears stated age Attitude/Demeanor/Rapport: Engaged, Gracious Affect (typically observed): Accepting, Appropriate, Calm, Pleasant Orientation: : Oriented to Self, Oriented to Place, Oriented to  Time, Oriented to Situation Alcohol / Substance Use: Not Applicable Psych Involvement: No (comment)  Admission diagnosis:  Anasarca [R60.1] Acute pulmonary edema with congestive heart failure (HCC) [I50.1] Acute on chronic HFrEF (heart failure with reduced ejection fraction) (HCC) [I50.23] Patient Active Problem List   Diagnosis Date Noted   Cocaine abuse ([place]) 02/12/2022   Acute pulmonary edema with congestive heart failure ([place])  Acute on chronic HFrEF (heart failure with reduced ejection fraction) ([place]) 02/09/2022   Syncope 02/09/2022   Menorrhagia with regular cycle 06/22/2021   HLD (hyperlipidemia) 11/27/2020   Type II diabetes mellitus with renal manifestations ([place]) 11/27/2020   Chronic kidney disease (CKD), stage II (mild) 11/27/2020   Iron deficiency anemia  11/27/2020   Hypomagnesemia 10/17/2020   Anasarca 10/11/2020   Dyspnea 05/21/2020   Elevated troponin 05/20/2020   Essential hypertension 05/20/2020   Diabetes mellitus, type II ([place]) 05/20/2020   Chronic anemia 05/20/2020   Reactive thrombocytosis 05/20/2020   Obesity (BMI 30-39.9) 05/20/2020   Chronic combined systolic and diastolic heart failure ([place]) 11/01/2019   Financial difficulties 11/01/2019   Medication management 11/01/2019   Lung nodule 11/01/2019   Dilated cardiomyopathy ([place]) 11/01/2019   Polysubstance abuse ([place]) 11/01/2019   Tobacco use 11/01/2019   HFrEF (heart failure with reduced ejection fraction) ([place])    Swelling    PCP:  [name], DO Pharmacy:   [place], [place] [address] [place] [address] Phone: [phone] Fax: [phone]     Social Determinants of Health (SDOH) Interventions    Readmission Risk Interventions    02/15/2022    2:34 PM 12/07/2020    3:29 PM  Readmission Risk Prevention Plan  Transportation Screening Complete Complete  PCP or Specialist Appt within 3-5 Days Complete   Social Work Consult for [place] Planning/Counseling Complete   Palliative Care Screening Not Applicable   Medication Review Press photographer) Complete Complete  PCP or Specialist appointment within 3-5 days of discharge  Complete  HRI or [place]  Complete  SW Recovery Care/Counseling Consult  Complete  Palliative Care Screening  Not [place]  Not Applicable

## 2022-02-15 NOTE — TOC Benefit Eligibility Note (Signed)
Patient Teacher, English as a foreign language completed.    The patient is currently admitted and upon discharge could be taking Entresto 97-103 mg.  The current 30 day co-pay is $4.00.   The patient is currently admitted and upon discharge could be taking Farxiga 10 mg.  The current 30 day co-pay is $4.00.   The patient is insured through [place] Patient Advocate Specialist [place] Patient Advocate Team Direct Number: [phone]  Fax: [phone]

## 2022-02-15 NOTE — Telephone Encounter (Signed)
Pharmacy Patient Advocate Encounter  Insurance verification completed.    The patient is insured through [facility] [place]   The patient is currently admitted and ran test claims for the following: Delene Loll, Farxiga, Lantus.  Copays and coinsurance results were relayed to Inpatient clinical team.

## 2022-02-15 NOTE — Progress Notes (Signed)
PROGRESS NOTE    Molly Howard  ENI:[id number] DOB: 01-26-1986 DOA: 02/09/2022 PCP: [name], DO    Brief Narrative:   Molly Howard is a 36 y.o. female with medical history significant of HFrEF with most recent EF 25-30% secondary to nonischemic cardiomyopathy, type 2 diabetes, hypertension, hyperlipidemia, CKD stage II, chronic anemia who presents to the ED from her cardiologist office for acute heart failure.  10/13 sob improving , not at baseline. No cp 10/14 improving slowly, not at baseline with doe, sob, and LE edema. Tele last night with nsvt.  10/15 no overnight issues 10/16 still vol. Up  Consultants:  cardiology  Procedures:   Antimicrobials:      Subjective: Feeling better.  Less short of breath but not at baseline   Objective: Vitals:   02/15/22 0344 02/15/22 0527 02/15/22 0528 02/15/22 0745  BP: 117/78  122/85 (!) 131/92  Pulse: 87  90 87  Resp: 16  17 17   Temp: 98.3 F (36.8 C)   98.2 F (36.8 C)  TempSrc: Oral     SpO2: 96%  99% 100%  Weight:  101.5 kg    Height:        Intake/Output Summary (Last 24 hours) at 02/15/2022 0901 Last data filed at 02/15/2022 0500 Gross per 24 hour  Intake 3 ml  Output 2700 ml  Net -2697 ml   Filed Weights   02/13/22 0413 02/14/22 0403 02/15/22 0527  Weight: 106.7 kg 104.3 kg 101.5 kg    Examination: Calm, NAD Cta no w/r Reg s1/s2 no gallop Soft benign +bs Bilateral lower extremity edema improving Aaoxox3  Mood and affect appropriate in current setting     Data Reviewed: I have personally reviewed following labs and imaging studies  CBC: Recent Labs  Lab 02/09/22 1214 02/10/22 0455 02/13/22 0454  WBC 5.7 6.6 6.5  NEUTROABS  --  4.9  --   HGB 7.5* 7.5* 7.7*  HCT 28.4* 28.9* 29.2*  MCV 73.4* 73.4* 74.9*  PLT 473* 491* 614*   Basic Metabolic Panel: Recent Labs  Lab 02/09/22 1214 02/10/22 0453 02/11/22 0428 02/12/22 2011 02/13/22 0454 02/14/22 0438 02/14/22 0444  NA 137 136  140  --  141  --  140  K 4.5 4.4 3.8 3.5 3.7  --  4.1  CL 105 104 108  --  103  --  102  CO2 26 28 27   --  29  --  31  GLUCOSE 184* 121* 170*  --  126*  --  178*  BUN 24* 26* 26*  --  24*  --  25*  CREATININE 1.14* 1.31* 1.29*  --  1.13*  --  1.26*  CALCIUM 7.6* 7.5* 7.4*  --  7.6*  --  7.7*  MG 1.7 1.9  --  1.6*  --  2.0  --   PHOS  --   --   --   --   --   --  4.6   GFR: Estimated Creatinine Clearance: 74.9 mL/min (A) (by C-G formula based on SCr of 1.26 mg/dL (H)). Liver Function Tests: Recent Labs  Lab 02/09/22 1214 02/13/22 0452 02/14/22 0444  AST 18  --   --   ALT 17  --   --   ALKPHOS 120  --   --   BILITOT 0.7  --   --   PROT 6.0*  --   --   ALBUMIN 1.8* 1.8* 1.9*   No results for input(s): "LIPASE", "AMYLASE" in  the last 168 hours. No results for input(s): "AMMONIA" in the last 168 hours. Coagulation Profile: No results for input(s): "INR", "PROTIME" in the last 168 hours. Cardiac Enzymes: No results for input(s): "CKTOTAL", "CKMB", "CKMBINDEX", "TROPONINI" in the last 168 hours. BNP (last 3 results) No results for input(s): "PROBNP" in the last 8760 hours. HbA1C: No results for input(s): "HGBA1C" in the last 72 hours.  CBG: Recent Labs  Lab 02/14/22 0740 02/14/22 1159 02/14/22 1638 02/14/22 2008 02/15/22 0745  GLUCAP 157* 183* 172* 216* 211*   Lipid Profile: No results for input(s): "CHOL", "HDL", "LDLCALC", "TRIG", "CHOLHDL", "LDLDIRECT" in the last 72 hours. Thyroid Function Tests: No results for input(s): "TSH", "T4TOTAL", "FREET4", "T3FREE", "THYROIDAB" in the last 72 hours. Anemia Panel: No results for input(s): "VITAMINB12", "FOLATE", "FERRITIN", "TIBC", "IRON", "RETICCTPCT" in the last 72 hours.  Sepsis Labs: No results for input(s): "PROCALCITON", "LATICACIDVEN" in the last 168 hours.  No results found for this or any previous visit (from the past 240 hour(s)).       Radiology Studies: No results found.      Scheduled Meds:   atorvastatin  20 mg Oral Daily   carvedilol  6.25 mg Oral BID WC   dapagliflozin propanediol  10 mg Oral Daily   enoxaparin (LOVENOX) injection  0.5 mg/kg Subcutaneous Q24H   famotidine  20 mg Oral BID   ferrous sulfate  325 mg Oral Q breakfast   furosemide  80 mg Intravenous Q8H   insulin aspart  0-20 Units Subcutaneous TID WC   insulin glargine-yfgn  7 Units Subcutaneous QHS   sacubitril-valsartan  1 tablet Oral BID   sodium chloride flush  3 mL Intravenous Q12H   spironolactone  25 mg Oral Daily   Continuous Infusions:  sodium chloride Stopped (02/13/22 0410)    Assessment & Plan:   Principal Problem:   Acute on chronic HFrEF (heart failure with reduced ejection fraction) (HCC) Active Problems:   Chronic anemia   Essential hypertension   Diabetes mellitus, type II (HCC)   HLD (hyperlipidemia)   Chronic kidney disease (CKD), stage II (mild)   Syncope   Dilated cardiomyopathy (HCC)   Acute pulmonary edema with congestive heart failure (HCC)   Cocaine abuse ([place])   Acute on chronic HFrEF (heart failure with reduced ejection fraction) ([place]) Continue current GDMT including carvedilol, Entresto, spironolactone, and Farxiga Cocaine positive 10/14 continue IV Lasix.  Her right foot pain is likely due to edema is not consistent with gout. Cardiology following Did have NSVT. Keep magnesium around 2 potassium around 4.  Cardiology will readdress ICD appropriateness in the outpatient setting. 10/16 still volume overloaded Continue IV Lasix for 1 more day   Chronic anemia Patient has a long-term history of chronic anemia secondary to iron deficiency in the setting of chronic blood loss secondary to menorrhagia.  Hemoglobin today is low at 7.5.  Iron panel demonstrates severe iron deficiency. 10/11 s/p iv iron yesterday 10/13 continue iron supplement  Follow up with PCP as outpatient 10/16 CBC stable    Essential hypertension Stable 10/16 continue current regimen    Diabetes mellitus, type II ([place]) Uncontrolled. A1c 9.6 BG stable and on low side one episode. Decrease lantus to 7unit qhs 10/16 BG stable R-ISS  HLD (hyperlipidemia) - Continue home atorvastatin   Chronic kidney disease (CKD), stage II (mild) Renal function at baseline at this time  monitor  Syncope Patient has a history of syncope with the most recent episode approximately 1 month ago.  This  is being monitored by her cardiologist but will admit to a telemetry unit while admitted as she is high risk for ventricular arrhythmia.  AICD placement was intended, but delayed due to anemia.  Drug abuse Cocaine positive. Counseled pt about cessation  DVT prophylaxis:lovenox Code Status:full Family Communication: none Disposition Plan:  Status is: Inpatient Remains inpatient appropriate because: iv treatment      LOS: 6 days   Time spent: 35 min    Nolberto Hanlon, MD [facility] Pager [phone]  If 7PM-7AM, please contact night-coverage 02/15/2022, 9:01 AM

## 2022-02-16 LAB — BASIC METABOLIC PANEL WITH GFR
Anion gap: 9 (ref 5–15)
BUN: 26 mg/dL — ABNORMAL HIGH (ref 6–20)
CO2: 30 mmol/L (ref 22–32)
Calcium: 8.2 mg/dL — ABNORMAL LOW (ref 8.9–10.3)
Chloride: 99 mmol/L (ref 98–111)
Creatinine, Ser: 1.24 mg/dL — ABNORMAL HIGH (ref 0.44–1.00)
GFR, Estimated: 57 mL/min — ABNORMAL LOW
Glucose, Bld: 168 mg/dL — ABNORMAL HIGH (ref 70–99)
Potassium: 3.9 mmol/L (ref 3.5–5.1)
Sodium: 138 mmol/L (ref 135–145)

## 2022-02-16 LAB — CBC
HCT: 31.5 % — ABNORMAL LOW (ref 36.0–46.0)
Hemoglobin: 8.6 g/dL — ABNORMAL LOW (ref 12.0–15.0)
MCH: 20.9 pg — ABNORMAL LOW (ref 26.0–34.0)
MCHC: 27.3 g/dL — ABNORMAL LOW (ref 30.0–36.0)
MCV: 76.5 fL — ABNORMAL LOW (ref 80.0–100.0)
Platelets: 435 10*3/uL — ABNORMAL HIGH (ref 150–400)
RBC: 4.12 MIL/uL (ref 3.87–5.11)
RDW: 26.5 % — ABNORMAL HIGH (ref 11.5–15.5)
WBC: 6 10*3/uL (ref 4.0–10.5)
nRBC: 0.8 % — ABNORMAL HIGH (ref 0.0–0.2)

## 2022-02-16 LAB — GLUCOSE, CAPILLARY: Glucose-Capillary: 143 mg/dL — ABNORMAL HIGH (ref 70–99)

## 2022-02-16 MED ORDER — FERROUS SULFATE 325 (65 FE) MG PO TABS: 325.0000 mg | ORAL_TABLET | Freq: Every day | ORAL | 0 refills | Status: DC

## 2022-02-16 MED ORDER — TORSEMIDE 60 MG PO TABS: 60.0000 mg | ORAL_TABLET | Freq: Two times a day (BID) | ORAL | 0 refills | Status: DC

## 2022-02-16 NOTE — Inpatient Diabetes Management (Signed)
Inpatient Diabetes Program Recommendations  AACE/ADA: New Consensus Statement on Inpatient Glycemic Control   Target Ranges:  Prepandial:   less than 140 mg/dL      Peak postprandial:   less than 180 mg/dL (1-2 hours)      Critically ill patients:  140 - 180 mg/dL    Latest Reference Range & Units 02/15/22 07:45 02/15/22 11:58 02/15/22 16:34 02/15/22 23:12 02/16/22 07:43  Glucose-Capillary 70 - 99 mg/dL 211 (H) 133 (H) 207 (H) 212 (H) 143 (H)   Review of Glycemic Control  Diabetes history: DM2 Outpatient Diabetes medications: Lantus 15 units BID, Farxiga 10 mg daily Current orders for Inpatient glycemic control: Semglee 7 units QHS, Novolog 0-20 units TID with meals, Farxiga 10 mg daily  Inpatient Diabetes Program Recommendations:    Insulin: Please consider adding Novolog 0-5 units QHS and ordering Novolog 3 units TID with meals for meal coverage if patient eats at least 50% of meals.  Thanks, Barnie Alderman, RN, MSN, [place] Diabetes Coordinator Inpatient Diabetes Program [phone] (Team Pager from 8am to [place])

## 2022-02-16 NOTE — TOC Transition Note (Signed)
Transition of Care [facility]) - CM/SW Discharge Note   Patient Details  Name: EVELETTE HOLLERN MRN: [id number] Date of Birth: 05-24-1985  [facility] CM/SW Contact:  Candie Chroman, LCSW Phone Number: 02/16/2022, 12:49 PM   Clinical Narrative: Patient has orders to discharge home today. No further concerns. CSW signing off.    Final next level of care: Home/Self Care Barriers to Discharge: Barriers Resolved   Patient Goals and CMS Choice   CMS Medicare.gov Compare Post Acute Care list provided to::  (N/A) Choice offered to / list presented to : NA  Discharge Placement                Patient to be transferred to facility by: Friend   Patient and family notified of of transfer: 02/16/22  Discharge Plan and Services     Post Acute Care Choice: NA                               Social Determinants of Health (SDOH) Interventions     Readmission Risk Interventions    02/15/2022    2:34 PM 12/07/2020    3:29 PM  Readmission Risk Prevention Plan  Transportation Screening Complete Complete  PCP or Specialist Appt within 3-5 Days Complete   Social Work Consult for [place] Planning/Counseling [place] Not Applicable   Medication Review Press photographer) Complete Complete  PCP or Specialist appointment within 3-5 days of discharge  Complete  HRI or [place]  Complete  SW Recovery Care/Counseling Consult  Complete  [place]  Not Applicable

## 2022-02-16 NOTE — Progress Notes (Signed)
Rounding Note    Patient Name: Molly Howard Date of Encounter: 02/16/2022  [place] Cardiologist: [name], MD   Subjective   UOP -1.2L. Breathing is better. No chest pain. Possible d/c today.  Inpatient Medications    Scheduled Meds:  atorvastatin  20 mg Oral Daily   carvedilol  6.25 mg Oral BID WC   dapagliflozin propanediol  10 mg Oral Daily   enoxaparin (LOVENOX) injection  0.5 mg/kg Subcutaneous Q24H   famotidine  20 mg Oral BID   ferrous sulfate  325 mg Oral Q breakfast   furosemide  80 mg Intravenous Q8H   insulin aspart  0-20 Units Subcutaneous TID WC   insulin glargine-yfgn  7 Units Subcutaneous QHS   sacubitril-valsartan  1 tablet Oral BID   sodium chloride flush  3 mL Intravenous Q12H   spironolactone  25 mg Oral Daily   Continuous Infusions:  sodium chloride Stopped (02/13/22 0410)   PRN Meds: sodium chloride, acetaminophen, guaiFENesin-dextromethorphan, ondansetron (ZOFRAN) IV, sodium chloride flush   Vital Signs    Vitals:   02/15/22 2020 02/15/22 2315 02/16/22 0333 02/16/22 0744  BP: 133/89 (!) 145/97 128/88 116/86  Pulse: 84 93 87 91  Resp: 18 18 18 20   Temp: 98.5 F (36.9 C) 98.5 F (36.9 C) 97.8 F (36.6 C) 98.4 F (36.9 C)  TempSrc:   Oral   SpO2: 100% 95% 100% 99%  Weight:      Height:        Intake/Output Summary (Last 24 hours) at 02/16/2022 1223 Last data filed at 02/16/2022 0330 Gross per 24 hour  Intake 480 ml  Output 1975 ml  Net -1495 ml      02/15/2022    5:27 AM 02/14/2022    4:03 AM 02/13/2022    4:13 AM  Last 3 Weights  Weight (lbs) 223 lb 11.2 oz 229 lb 15 oz 235 lb 3.7 oz  Weight (kg) 101.47 kg 104.3 kg 106.7 kg      Telemetry    N/A - Personally Reviewed  ECG    No new - Personally Reviewed  Physical Exam   GEN: No acute distress.   Neck: No JVD Cardiac: RRR, no murmurs, rubs, or gallops.  Respiratory: Clear to auscultation bilaterally. GI: Soft, nontender, non-distended   MS: minimal lower leg edema; No deformity. Neuro:  Nonfocal  Psych: Normal affect   Labs    High Sensitivity Troponin:  No results for input(s): "TROPONINIHS" in the last 720 hours.   Chemistry Recent Labs  Lab 02/10/22 0453 02/11/22 0428 02/12/22 2011 02/13/22 0452 02/13/22 0454 02/14/22 0438 02/14/22 0444 02/15/22 0922 02/16/22 0447  NA 136   < >  --   --    < >  --  140 139 138  K 4.4   < > 3.5  --    < >  --  4.1 4.4 3.9  CL 104   < >  --   --    < >  --  102 99 99  CO2 28   < >  --   --    < >  --  31 32 30  GLUCOSE 121*   < >  --   --    < >  --  178* 190* 168*  BUN 26*   < >  --   --    < >  --  25* 26* 26*  CREATININE 1.31*   < >  --   --    < >  --  1.26* 1.28* 1.24*  CALCIUM 7.5*   < >  --   --    < >  --  7.7* 7.9* 8.2*  MG 1.9  --  1.6*  --   --  2.0  --   --   --   ALBUMIN  --   --   --  1.8*  --   --  1.9*  --   --   GFRNONAA 54*   < >  --   --    < >  --  56* 55* 57*  ANIONGAP 4*   < >  --   --    < >  --  7 8 9    < > = values in this interval not displayed.    Lipids No results for input(s): "CHOL", "TRIG", "HDL", "LABVLDL", "LDLCALC", "CHOLHDL" in the last 168 hours.  Hematology Recent Labs  Lab 02/10/22 0455 02/13/22 0454 02/16/22 0447  WBC 6.6 6.5 6.0  RBC 3.94 3.90 4.12  HGB 7.5* 7.7* 8.6*  HCT 28.9* 29.2* 31.5*  MCV 73.4* 74.9* 76.5*  MCH 19.0* 19.7* 20.9*  MCHC 26.0* 26.4* 27.3*  RDW 22.2* 23.3* 26.5*  PLT 491* 504* 435*   Thyroid No results for input(s): "TSH", "FREET4" in the last 168 hours.  BNPNo results for input(s): "BNP", "PROBNP" in the last 168 hours.  DDimer No results for input(s): "DDIMER" in the last 168 hours.   Radiology    No results found.  Cardiac Studies   Echo 09/18/21  1. Left ventricular ejection fraction, by estimation, is 25 to 30%. Left  ventricular ejection fraction by 3D volume is 28 %. The left ventricle has  severely decreased function. The left ventricle demonstrates global  hypokinesis. Left  ventricular  diastolic parameters are consistent with Grade II diastolic dysfunction  (pseudonormalization). The average left ventricular global longitudinal  strain is -5.9 %. The global longitudinal strain is abnormal.   2. Right ventricular systolic function is mildly reduced. The right  ventricular size is mildly enlarged. Tricuspid regurgitation signal is  inadequate for assessing PA pressure.   3. The mitral valve is normal in structure. No evidence of mitral valve  regurgitation. No evidence of mitral stenosis.   4. The aortic valve is tricuspid. Aortic valve regurgitation is not  visualized. No aortic stenosis is present.   5. The inferior vena cava is normal in size with greater than 50%  respiratory variability, suggesting right atrial pressure of 3 mmHg.   Patient Profile     36 y.o. female with a history of chronic systolic and diastolic congestive heart failure, nonischemic cardiomyopathy, hypertension, type 2 diabetes, who has been seen and evaluated for acute on chronic volume overload secondary to systolic and diastolic heart failure.  Assessment & Plan    Acute on chronic systolic and diastolic heart failure NICM - BNP 859 - IV lasix 80mg  TID - continue Coreg, Entresto, Farxiga and spiro - Net -16.2L - kidney function is stable  - PTA torsemide 60mg  daily - continue with diuresis,. Given possible d/c today may need higher dose of torsemide at discharge  HTN - BP good, continue current medications  CKD stage 2 - stable Scr 1.2-1.3 - daily BMET  H/o syncope/NSVT - prior syncopal episode in May 2023 showing EF 25-30% - tele shows NSVT - continues to be asymptomatic - continue BB therapy - possible ICD placement if she can show compliance with  medications and avoidance of illicit substances  Menorrhagia/microcytic anemia -  Hgb down to 7.7 on 02/13/22 - daily CBC  Polysubstance abuse - tobacco and cocaine cessation recommended  For questions or updates,  please contact [place] Please consult [email or web address] for contact info under        Signed, Jeorgia Helming Ninfa Meeker, PA-C  02/16/2022, 12:23 PM

## 2022-02-16 NOTE — Discharge Summary (Signed)
Molly Howard SUP:[id number] DOB: 1986-04-14 DOA: 02/09/2022  PCP: Margarita Mail, DO  Admit date: 02/09/2022 Discharge date: 02/16/2022  Admitted From: Home Disposition: Home  Recommendations for Outpatient Follow-up:  Follow up with PCP in 1 week Please obtain BMP/CBC in one week Please follow up cardio allergy and heart failure clinic in 1 week      Discharge Condition:Stable CODE STATUS: Full Diet recommendation: Heart Healthy / Carb Modified /low-sodium  Brief/Interim Summary: Per PFY:TWKMQ Molly Howard is a 36 y.o. female with medical history significant of HFrEF with most recent EF 25-30% secondary to nonischemic cardiomyopathy, type 2 diabetes, hypertension, hyperlipidemia, CKD stage II, chronic anemia who presents to the ED from her cardiologist office for acute heart failure.  Molly Howard states she has been taking all of her guideline directed medical therapy as prescribed in addition to 3 tablets of torsemide daily as instructed by her cardiologist.  Despite this, she has gained approximately 25 pounds in the last month.  She is experiencing shortness of breath with intermittent substernal chest pain and occasional palpitations.  She states that occasionally she does not lose consciousness upon standing with the most recent episode occurring approximately 1 month ago.  She has followed up with her cardiologist regarding this who is working towards getting an ICD placed.   Molly Howard states that she has not yet started her OCP and continues to have heavy menorrhagia.  She is never taken IV iron but is unable to tolerate p.o. iron.   She denies any recent illness including fever, chills, congestion.  She notes a chronic cough that seems to have worsened since she has developed increased weight.   On arrival to the ED, patient was hypertensive at 138/98 with a heart rate of 103.  She was saturating at 95% on room air.  BNP elevated at 859.  Cardiology consulted by ED provider.  [facility]  contacted for admission.  She was started on Lasix 80 mg IV 3 times daily.  She was cocaine positive on admission. As she is more euvolemic and clinically improved.  She is ready for discharge.  We have increased her torsemide outpatient to 60 mg p.o. twice daily.  She will need to follow-up with cardiology closely and get repeat labs.  Acute on chronic HFrEF (heart failure with reduced ejection fraction) (HCC) Continue current GDMT including carvedilol, Entresto, spironolactone, and Farxiga Cocaine positive 10/14 continue IV Lasix.  Her right foot pain is likely due to edema is not consistent with gout. Cardiology following Did have NSVT. Keep magnesium around 2 potassium around 4.  Cardiology will readdress ICD appropriateness in the outpatient setting. 10/16 still volume overloaded Continue IV Lasix for 1 more day     Chronic anemia Patient has a long-term history of chronic anemia secondary to iron deficiency in the setting of chronic blood loss secondary to menorrhagia.  Hemoglobin today is low at 7.5.  Iron panel with iron deficiency. S/p iv iron  Continue iron supplement F/u with pcp as outpatient      Essential hypertension Stable    Diabetes mellitus, type II (HCC) Uncontrolled. A1c 9.6 Continue home meds follow-up with PCP  Follow carb modified diet   HLD (hyperlipidemia) - Continue home atorvastatin   Chronic kidney disease (CKD), stage II (mild) Renal function at baseline at this time Is to monitor closely as outpatient by PCP   Syncope patient has a history of syncope with the most recent episode approximately 1 month ago.   telemetry shows some  NSVT she was asymptomatic  Continue Coreg  possible ICD placement if she can show compliance with  medications and avoidance of illicit substances   Drug abuse Cocaine positive. Counseled pt about cessation     Discharge Diagnoses:  Principal Problem:   Acute on chronic HFrEF (heart failure with reduced  ejection fraction) (HCC) Active Problems:   Chronic anemia   Essential hypertension   Diabetes mellitus, type II (HCC)   HLD (hyperlipidemia)   Chronic kidney disease (CKD), stage II (mild)   Syncope   Dilated cardiomyopathy (HCC)   Acute pulmonary edema with congestive heart failure ([place])   Cocaine abuse ([place])    Discharge Instructions  Discharge Instructions     Diet - low sodium heart healthy   Complete by: As directed    Discharge instructions   Complete by: As directed    No drugs! Take your meds Follow low sodium diet F/u with chf clinic and cardiology in 5-7 days F/u with pcp in one week   Increase activity slowly   Complete by: As directed       Allergies as of 02/16/2022       Reactions   No [employer] Food Allergies Rash, Other (See Comments)   Lemons        Medication List     STOP taking these medications    acetaminophen 500 MG tablet Commonly known as: TYLENOL       TAKE these medications    atorvastatin 20 MG tablet Commonly known as: LIPITOR TAKE 1 TABLET BY MOUTH DAILY   carvedilol 6.25 MG tablet Commonly known as: COREG Take 1 tablet (6.25 mg total) by mouth 2 (two) times daily with a meal.   dapagliflozin propanediol 10 MG Tabs tablet Commonly known as: FARXIGA Take 1 tablet (10 mg total) by mouth once daily.   Entresto 97-103 MG Generic drug: sacubitril-valsartan Take 1 tablet by mouth 2 (two) times daily.   famotidine 20 MG tablet Commonly known as: Pepcid Take 1 tablet (20 mg total) by mouth 2 (two) times daily.   ferrous sulfate 325 (65 FE) MG tablet Take 1 tablet (325 mg total) by mouth daily with breakfast. Start taking on: February 17, 2022   Lantus SoloStar 100 UNIT/ML Solostar Pen Generic drug: insulin glargine Take 10 units in the morning and 15 in the evenings. What changed:  how much to take when to take this   norethindrone 5 MG tablet Commonly known as: AYGESTIN Take 1 tablet (5 mg total) by mouth  daily.   potassium chloride SA 20 MEQ tablet Commonly known as: KLOR-CON M Take 0.5 tablets (10 mEq total) by mouth daily.   spironolactone 25 MG tablet Commonly known as: ALDACTONE TAKE 1 TABLET BY MOUTH DAILY   Torsemide 60 MG Tabs Take 60 mg by mouth 2 (two) times daily. What changed:  medication strength when to take this        Follow-up Information     [name], MD Follow up in 1 week(s).   Specialties: Cardiology, Radiology Why: needs labs Contact information: [place] [address] [phone]         [facility] Follow up.   Specialty: Cardiology Contact information: [place] [phone]               Allergies  Allergen Reactions   No [employer] Food Allergies Rash and Other (See Comments)  Lemons    Consultations: cardiology   Procedures/Studies: DG Chest 2 View  Result Date: 02/09/2022 CLINICAL DATA:  Shortness of breath EXAM: CHEST - 2 VIEW COMPARISON:  07/21/2021 FINDINGS: Cardiomegaly, which appears somewhat similar to 11/19/2020 but increased compared to 07/21/2021. Interstitial opacities, likely pulmonary edema. No pleural effusion or pneumothorax. No acute osseous abnormality. IMPRESSION: Cardiomegaly and mild pulmonary edema. Electronically Signed   By: Merilyn Baba M.D.   On: 02/09/2022 13:14      Subjective: Feels well. She is dressed and ready to go. Tells me her ride will be here by 1pm. Told pt cardiology needs to clear her. Denies sob, or cp. No dizziness  Discharge Exam: Vitals:   02/16/22 0333 02/16/22 0744  BP: 128/88 116/86  Pulse: 87 91  Resp: 18 20  Temp: 97.8 F (36.6 C) 98.4 F (36.9 C)  SpO2: 100% 99%   Vitals:   02/15/22 2020 02/15/22 2315 02/16/22 0333 02/16/22 0744  BP: 133/89 (!) 145/97 128/88 116/86  Pulse: 84 93 87 91  Resp: 18 18 18 20   Temp: 98.5 F (36.9 C)  98.5 F (36.9 C) 97.8 F (36.6 C) 98.4 F (36.9 C)  TempSrc:   Oral   SpO2: 100% 95% 100% 99%  Weight:      Height:        General: Pt is alert, awake, not in acute distress Cardiovascular: RRR, S1/S2 +, no rubs, no gallops Respiratory: CTA bilaterally, no wheezing, no rhonchi Abdominal: Soft, NT, ND, bowel sounds + Extremities: much decrease LE edema, no cyanosis    The results of significant diagnostics from this hospitalization (including imaging, microbiology, ancillary and laboratory) are listed below for reference.     Microbiology: No results found for this or any previous visit (from the past 240 hour(s)).   Labs: BNP (last 3 results) Recent Labs    09/14/21 1507 01/15/22 1422 02/09/22 1214  BNP 632.7* 792.8* [id number]*   Basic Metabolic Panel: Recent Labs  Lab 02/10/22 0453 02/11/22 0428 02/12/22 2011 02/13/22 0454 02/14/22 0438 02/14/22 0444 02/15/22 0922 02/16/22 0447  NA 136 140  --  141  --  140 139 138  K 4.4 3.8 3.5 3.7  --  4.1 4.4 3.9  CL 104 108  --  103  --  102 99 99  CO2 28 27  --  29  --  31 32 30  GLUCOSE 121* 170*  --  126*  --  178* 190* 168*  BUN 26* 26*  --  24*  --  25* 26* 26*  CREATININE 1.31* 1.29*  --  1.13*  --  1.26* 1.28* 1.24*  CALCIUM 7.5* 7.4*  --  7.6*  --  7.7* 7.9* 8.2*  MG 1.9  --  1.6*  --  2.0  --   --   --   PHOS  --   --   --   --   --  4.6  --   --    Liver Function Tests: Recent Labs  Lab 02/13/22 0452 02/14/22 0444  ALBUMIN 1.8* 1.9*   No results for input(s): "LIPASE", "AMYLASE" in the last 168 hours. No results for input(s): "AMMONIA" in the last 168 hours. CBC: Recent Labs  Lab 02/10/22 0455 02/13/22 0454 02/16/22 0447  WBC 6.6 6.5 6.0  NEUTROABS 4.9  --   --   HGB 7.5* 7.7* 8.6*  HCT 28.9* 29.2* 31.5*  MCV 73.4* 74.9* 76.5*  PLT 491* 504* 435*   Cardiac Enzymes: No  results for input(s): "CKTOTAL", "CKMB", "CKMBINDEX", "TROPONINI" in the last 168 hours. BNP: Invalid input(s):  "POCBNP" CBG: Recent Labs  Lab 02/15/22 0745 02/15/22 1158 02/15/22 1634 02/15/22 2312 02/16/22 0743  GLUCAP 211* 133* 207* 212* 143*   D-Dimer No results for input(s): "DDIMER" in the last 72 hours. Hgb A1c No results for input(s): "HGBA1C" in the last 72 hours. Lipid Profile No results for input(s): "CHOL", "HDL", "LDLCALC", "TRIG", "CHOLHDL", "LDLDIRECT" in the last 72 hours. Thyroid function studies No results for input(s): "TSH", "T4TOTAL", "T3FREE", "THYROIDAB" in the last 72 hours.  Invalid input(s): "FREET3" Anemia work up No results for input(s): "VITAMINB12", "FOLATE", "FERRITIN", "TIBC", "IRON", "RETICCTPCT" in the last 72 hours. Urinalysis No results found for: "COLORURINE", "APPEARANCEUR", "LABSPEC", "PHURINE", "GLUCOSEU", "HGBUR", "BILIRUBINUR", "KETONESUR", "PROTEINUR", "UROBILINOGEN", "NITRITE", "LEUKOCYTESUR" Sepsis Labs Recent Labs  Lab 02/10/22 0455 02/13/22 0454 02/16/22 0447  WBC 6.6 6.5 6.0   Microbiology No results found for this or any previous visit (from the past 240 hour(s)).   Time coordinating discharge: Over 30 minutes  SIGNED:   Nolberto Hanlon, MD  [facility] 02/16/2022, 12:53 PM Pager   If 7PM-7AM, please contact night-coverage [email or web address] Password [email or web address]

## 2022-02-16 NOTE — Progress Notes (Signed)
   Heart Failure Nurse Navigator Note  Met with patient today, she is sitting up in the chair at bedside dressed in street close.  She states that she is being discharged home today.  By teach back method went over daily weights and what to report, fluid restriction etc. she still needs reinforcement.  Also made aware that she has an appointment in the heart failure clinic this Friday, October 20 at 11 AM.  She was instructed when she gets home to make arrangements for her transportation to that clinic appointment.  She voices understanding.  Pricilla Riffle RN [facility]

## 2022-02-19 ENCOUNTER — Telehealth (HOSPITAL_COMMUNITY): Payer: Self-pay

## 2022-02-19 ENCOUNTER — Encounter: Payer: Self-pay | Admitting: Cardiology

## 2022-02-19 ENCOUNTER — Ambulatory Visit: Payer: Medicaid Other | Admitting: Family

## 2022-02-19 ENCOUNTER — Ambulatory Visit: Payer: Medicaid Other | Attending: Cardiology | Admitting: Cardiology

## 2022-02-19 NOTE — Telephone Encounter (Signed)
Had attempted several times to contact [place] today to remind her of her appt with cardiology, was no answer.  Just now she answered her phone.  She had no reason for the no show, she said she was sleeping all day.  She states she has not been feeling good since discharge but weight is staying down.  She states yesterday she weighed at 206 lbs.  She states has all her medications.  Attempted to make a home appt with her next week but she states she is going to a friends house to [place] for a week.  Advised her to keep weighing and watch what she eats and drinks. Reminded her of her other upcoming appts.  Advised her to go ahead to schedule transportation now.   Advised her to let me know when she is back in town.   [place] EMT-paramedic [phone]

## 2022-03-04 ENCOUNTER — Ambulatory Visit: Payer: Medicaid Other | Admitting: Internal Medicine

## 2022-03-04 NOTE — Progress Notes (Deleted)
Established Patient Office Visit  Subjective:  Patient ID: Molly Howard, female    DOB: August 16, 1985  Age: 36 y.o. MRN: [id number]  CC:  No chief complaint on file.   HPI Molly Howard presents for hospital follow up.  Discharge Date: 02/09/2022 Diagnosis: acute on chronic HfrEF, chronic anemia Procedures/tests: BNP elevated to 859, found to be cocaine positive on admission. Consultants: Cardiology New medications: *** Discontinued medications: *** Discharge instructions:  *** Status: {Blank multiple:19196::"better","worse","stable","fluctuating"}  Patient presented to the ED sent from her cardiologist office for weight gain and shortness of breath.  BNP was elevated and she was started on Lasix 80 mg IV 3 times daily.  She was found to be cocaine positive on admission.  Her outpatient torsemide was increased to 60 mg twice daily.  Diabetes Type 2: -First diagnosed when she was a kid and told she was a type 1, but then told she was a type 2 later in life so uncertain  -Last P5F 1/63 10.5 -Medications: Fargixa 10, Lantus 10 units BID -Patient is compliant with the above medications and reports no side effects.  -Checking BG at home: fasting 250 average, evening 150, went to 70's in the afternoon once a few days ago  -Diet: recently cut out fried foods, tries to eat fruit for snacks but has been drinking [place] Orange Juice  -Exercise: tries to walk but gets short of breath with the heart failure  -Eye exam: April 22 scheduled but did not go - due  -Foot exam: UTD, saw Podiatrist and had nails cut  -Microalbumin: elevated, on Entresto and Farxiga  -Statin: yes -PNA vaccine: Prevnar 23 in 7/22 -Denies symptoms of hypoglycemia, polyuria, polydipsia, numbness extremities, foot ulcers/trauma.   Hypertension/HFrEF/NICM: -Medications: Torsemide 60 mg twice daily, carvedilol 6.25, Spirnolactone 25, Entresto and KCl 10, Farxiga 10 mg -Patient is compliant with above medications  and reports no side effects. -Denies any SOB, CP, vision changes, LE edema or symptoms of hypotension -Follows with Cardiology note reviewed from 01/15/22 -Planning for subcutaneous ICD but hemoglobin low  -Last echo 09/18/21 showing reduced EF at 25-30% with global hypokinesis of the left ventricle  -Is working with cardiology to have ICD placed.  HLD: -Medications: Lipitor 20 mg for one year -Patient is compliant with above medications and reports no side effects.  -Last lipid panel: Lipid Panel     Component Value Date/Time   CHOL 286 (H) 06/12/2021 1354   TRIG 242 (H) 06/12/2021 1354   HDL 74 06/12/2021 1354   CHOLHDL 3.9 06/12/2021 1354   VLDL 24 05/21/2020 0643   LDLCALC 171 (H) 06/12/2021 1354   GERD: -Not currently on daily medications, taking Pepcid as needed   CKD: -Last creatinine 9/23 1.33, GFR 53   Anemia: -Hgb 10.3 in 2/23, has menorrhagia, referred to Gyn who did Pap and vaginal [place]. US showing endometrial polyp and she was given an OCP in March but she has not started it or picked it up from the pharmacy yet.  - last hgb 7/23 8.6% -Having a period once a month - will bleed for 7 days, light for 3 days then heavy the last 4 days. Has to change pads/tampons every 1 hour on heavy days. Does have clots.   -Had been seeing gynecology and was prescribed an OCP, however the patient states she cannot afford this medication -She stating now that her menstrual cycles are more manageable.  Her LMP was a few days ago.  She states her bleeding  is much lighter and more tolerable.  Her usual menstrual cycle lasts about 1 week.  Past Medical History:  Diagnosis Date   Acid reflux    Chronic HFrEF (heart failure with reduced ejection fraction) ([place])    a. 10/2019 Echo: EF 35-40%, GrII DD; b. 05/2020 Echo: EF 20-25%, glob HK; c. 10/2020 Echo: EF 30-35%, glob HK. GrII DD, Mildly red RV fxn. Mod TR; d.  05/2021 cMRI: EF 42%, no LGE. Nl RV size/fxn.   CKD (chronic kidney disease), stage II     Diabetes mellitus ([place])    H/O medication noncompliance    Hypertension    Microcytic anemia    NICM (nonischemic cardiomyopathy) ([place])    a. 10/2019 Echo: EF 35-40%; b. 10/2019 MV: No ischemia. Small apical defect-->breast attenuation; c. 05/2020 Echo: EF 20-25%; d. 10/2020 Echo: EF 30-35%, glob HK. GrII DD, Mildly red RV fxn. Mod TR; e. 05/2021 cMRI: EF 42%, no LGE. Nl RV size/fxn.   Obesity    Polysubstance abuse ([place])     Past Surgical History:  Procedure Laterality Date   CHOLECYSTECTOMY     HERNIA REPAIR      Family History  Problem Relation Age of Onset   Heart failure Mother        a. onset in her 47s   Diabetes Mother    Hypertension Father    Diabetes Father     Social History   Socioeconomic History   Marital status: Single    Spouse name: Not on file   Number of children: Not on file   Years of education: Not on file   Highest education level: Not on file  Occupational History   Not on file  Tobacco Use   Smoking status: Former    Packs/day: 0.50    Types: Cigarettes    Quit date: 2022    Years since quitting: 1.8   Smokeless tobacco: Never  Vaping Use   Vaping Use: Never used  Substance and Sexual Activity   Alcohol use: Not Currently    Comment: ~ 4 shots/day on weekends only   Drug use: Not Currently    Types: Cocaine    Comment: Last day of use was prior to June 2021 hosp.visit.    Sexual activity: Not Currently    Birth control/protection: None  Other Topics Concern   Not on file  Social History Narrative   Not on file   Social Determinants of Health   Financial Resource Strain: High Risk (06/16/2020)   Overall Financial Resource Strain (CARDIA)    Difficulty of Paying Living Expenses: Hard  Food Insecurity: No Food Insecurity (02/10/2022)   Hunger Vital Sign    Worried About Running Out of Food in the Last Year: Never true    Ran Out of Food in the Last Year: Never true  Transportation Needs: No Transportation Needs (02/10/2022)    PRAPARE - Hydrologist (Medical): No    Lack of Transportation (Non-Medical): No  Physical Activity: Not on file  Stress: Not on file  Social Connections: Not on file  Intimate Partner Violence: Not At Risk (02/10/2022)   Humiliation, Afraid, Rape, and Kick questionnaire    Fear of Current or Ex-Partner: No    Emotionally Abused: No    Physically Abused: No    Sexually Abused: No    Outpatient Medications Prior to Visit  Medication Sig Dispense Refill   atorvastatin (LIPITOR) 20 MG tablet TAKE 1 TABLET BY MOUTH DAILY  90 tablet 3   carvedilol (COREG) 6.25 MG tablet Take 1 tablet (6.25 mg total) by mouth 2 (two) times daily with a meal. 180 tablet 3   dapagliflozin propanediol (FARXIGA) 10 MG TABS tablet Take 1 tablet (10 mg total) by mouth once daily. 90 tablet 3   famotidine (PEPCID) 20 MG tablet Take 1 tablet (20 mg total) by mouth 2 (two) times daily. 90 tablet 3   ferrous sulfate 325 (65 FE) MG tablet Take 1 tablet (325 mg total) by mouth daily with breakfast. 30 tablet 0   insulin glargine (LANTUS SOLOSTAR) 100 UNIT/ML Solostar Pen Take 10 units in the morning and 15 in the evenings. (Patient taking differently: 15 Units 2 (two) times daily. Take 10 units in the morning and 15 in the evenings.) 15 mL PRN   norethindrone (AYGESTIN) 5 MG tablet Take 1 tablet (5 mg total) by mouth daily. 90 tablet 0   potassium chloride SA (KLOR-CON M) 20 MEQ tablet Take 0.5 tablets (10 mEq total) by mouth daily. 45 tablet 3   sacubitril-valsartan (ENTRESTO) 97-103 MG Take 1 tablet by mouth 2 (two) times daily. 180 tablet 3   spironolactone (ALDACTONE) 25 MG tablet TAKE 1 TABLET BY MOUTH DAILY 90 tablet 3   torsemide 60 MG TABS Take 60 mg by mouth 2 (two) times daily. 30 tablet 0   No facility-administered medications prior to visit.    Allergies  Allergen Reactions   No [employer] Food Allergies Rash and Other (See Comments)    Lemons    ROS Review of Systems   Constitutional:  Negative for chills and fever.  Respiratory:  Positive for cough and shortness of breath.   Cardiovascular:  Positive for leg swelling. Negative for chest pain.  Gastrointestinal:  Positive for abdominal distention. Negative for diarrhea, nausea and vomiting.  Skin: Negative.   Neurological:  Positive for syncope and light-headedness.      Objective:    Physical Exam Constitutional:      Appearance: Normal appearance.  HENT:     Head: Normocephalic and atraumatic.  Eyes:     Conjunctiva/sclera: Conjunctivae normal.  Cardiovascular:     Rate and Rhythm: Normal rate and regular rhythm.  Pulmonary:     Effort: Pulmonary effort is normal.     Breath sounds: Normal breath sounds. No rales.  Abdominal:     General: There is distension.  Musculoskeletal:     Right lower leg: Edema present.     Left lower leg: Edema present.     Comments: 1+ bilateral lower extremity pitting edema  Skin:    General: Skin is warm and dry.  Neurological:     General: No focal deficit present.     Mental Status: She is alert. Mental status is at baseline.  Psychiatric:        Mood and Affect: Mood normal.        Behavior: Behavior normal.     LMP 01/31/2022 (Exact Date)  Wt Readings from Last 3 Encounters:  02/15/22 223 lb 11.2 oz (101.5 kg)  02/09/22 263 lb (119.3 kg)  02/04/22 252 lb 12.8 oz (114.7 kg)     Health Maintenance Due  Topic Date Due   COVID-19 Vaccine (1) Never done   OPHTHALMOLOGY EXAM  Never done    There are no preventive care reminders to display for this patient.  Lab Results  Component Value Date   TSH 5.114 (H) 11/09/2021   Lab Results  Component Value Date  WBC 6.0 02/16/2022   HGB 8.6 (L) 02/16/2022   HCT 31.5 (L) 02/16/2022   MCV 76.5 (L) 02/16/2022   PLT 435 (H) 02/16/2022   Lab Results  Component Value Date   NA 138 02/16/2022   K 3.9 02/16/2022   CO2 30 02/16/2022   GLUCOSE 168 (H) 02/16/2022   BUN 26 (H) 02/16/2022    CREATININE 1.24 (H) 02/16/2022   BILITOT 0.7 02/09/2022   ALKPHOS 120 02/09/2022   AST 18 02/09/2022   ALT 17 02/09/2022   PROT 6.0 (L) 02/09/2022   ALBUMIN 1.9 (L) 02/14/2022   CALCIUM 8.2 (L) 02/16/2022   ANIONGAP 9 02/16/2022   EGFR 52 (L) 06/12/2021   Lab Results  Component Value Date   CHOL 286 (H) 06/12/2021   Lab Results  Component Value Date   HDL 74 06/12/2021   Lab Results  Component Value Date   LDLCALC 171 (H) 06/12/2021   Lab Results  Component Value Date   TRIG 242 (H) 06/12/2021   Lab Results  Component Value Date   CHOLHDL 3.9 06/12/2021   Lab Results  Component Value Date   HGBA1C 9.6 (H) 02/10/2022      Assessment & Plan:   1. Type 2 diabetes mellitus with other kidney complication, unspecified whether long term insulin use ([place]): A1c is not well controlled.  It is too soon to recheck, however we will keep her Lantus the same in the morning at 10 units and increase the evening dose to 15 units.  Refill insulin today.  Follow-up in 1 month for A1c recheck.  - insulin glargine (LANTUS SOLOSTAR) 100 UNIT/ML Solostar Pen; Take 10 units in the morning and 15 in the evenings.  Dispense: 15 mL; Refill: PRN  2. Anemia, unspecified type: Patient states that she cannot afford her OCPs, however her menstrual cycles are much more tolerable.  We will recheck CBC, however if hemoglobin is still low she will require referral to hematology.  - CBC w/Diff/Platelet  3. HFrEF (heart failure with reduced ejection fraction) (H[facility])/Syncope, unspecified syncope type: Heart failure worsening.  She is following with the heart failure clinic, note reviewed from 01/15/2022.  Medications reviewed.  Syncope most likely secondary to a combination of the patient being intravascularly dry due to worsening heart failure as well as anemia.  Discussed signs of orthostatic hypotension, standing and walking slowly.  The patient does live with her mother and sister.  Reiterated importance of  following fluid and sodium restriction.  4. Need for influenza vaccination: Flu vaccine administered today.  - Flu Vaccine QUAD 6+ mos PF IM (Fluarix Quad PF)  Follow-up: No follow-ups on file.    [name], DO

## 2022-03-10 NOTE — Telephone Encounter (Signed)
Error

## 2022-03-17 ENCOUNTER — Ambulatory Visit: Payer: Medicaid Other | Admitting: Family

## 2022-04-01 ENCOUNTER — Telehealth (HOSPITAL_COMMUNITY): Payer: Self-pay

## 2022-04-01 NOTE — Telephone Encounter (Signed)
Attempted to contact several times with no answer.  She sometimes does not have minutes on phone so I text her with no response.  I text her appt details for HF clinic appt next week.  Will continue to try to reach.   Earmon [place] Pelican EMT-Paramedic [phone]

## 2022-04-04 NOTE — Progress Notes (Deleted)
Patient ID: Molly Howard, female    DOB: 01/30/1986, 36 y.o.   MRN: [id number]  HPI  Ms Bouska is a 36 y/o female with a history of DM, HTN, previous drug use and chronic heart failure.   Echo report from 09/18/21 reviewed and showed an EF of 25-30%. Echo report from 06/30/21 reviewed and showed an EF of 30-35% along with mild MR. Echo report from 03/20/21 reviewed and showed an EF of 25-30% along with severely elevated PA pressure of 70.8 mmHg, mild LAE, mild MR and mild/moderate TR. Echo report from 10/11/20 reviewed and showed an EF of 30-35% along with moderate TR.   Cardiac MRI 05/19/21 showed an EF of 42%  Admitted 02/09/22 due to shortness of breath with intermittent substernal chest pain and occasional palpitations. Cardiology consult obtained. Initially given IV lasix with transition to oral diuretics. + for cocaine. Did have NSVT. Keep magnesium around 2 potassium around 4. Given IV iron due to anemia. Discharged after 7 days. Was in the ED 11/28/21 due to HF exacerbation. Given additional diuretic with improvement of symptoms and she was released. Was in the ED 11/10/21 due to hemoglobin of 8.0. Transfusion given and she was released. Was in the ED 10/21/21 due to acute right knee pain where she was evaluated and released.   She presents today for a follow-up visit with a chief complaint of     Was supposed to have ICD placed but has become anemic and they are wanting this to be corrected prior to insertion.   Past Medical History:  Diagnosis Date   Acid reflux    Chronic HFrEF (heart failure with reduced ejection fraction) ([facility])    a. 10/2019 Echo: EF 35-40%, GrII DD; b. 05/2020 Echo: EF 20-25%, glob HK; c. 10/2020 Echo: EF 30-35%, glob HK. GrII DD, Mildly red RV fxn. Mod TR; d.  05/2021 cMRI: EF 42%, no LGE. Nl RV size/fxn.   CKD (chronic kidney disease), stage II    Diabetes mellitus ([facility])    H/O medication noncompliance    Hypertension    Microcytic anemia    NICM (nonischemic  cardiomyopathy) ([facility])    a. 10/2019 Echo: EF 35-40%; b. 10/2019 MV: No ischemia. Small apical defect-->breast attenuation; c. 05/2020 Echo: EF 20-25%; d. 10/2020 Echo: EF 30-35%, glob HK. GrII DD, Mildly red RV fxn. Mod TR; e. 05/2021 cMRI: EF 42%, no LGE. Nl RV size/fxn.   Obesity    Polysubstance abuse ([facility])    Past Surgical History:  Procedure Laterality Date   CHOLECYSTECTOMY     HERNIA REPAIR     Family History  Problem Relation Age of Onset   Heart failure Mother        a. onset in her 60s   Diabetes Mother    Hypertension Father    Diabetes Father    Social History   Tobacco Use   Smoking status: Former    Packs/day: 0.50    Types: Cigarettes    Quit date: 2022    Years since quitting: 1.9   Smokeless tobacco: Never  Substance Use Topics   Alcohol use: Not Currently    Comment: ~ 4 shots/day on weekends only   Allergies  Allergen Reactions   No [employer] Food Allergies Rash and Other (See Comments)    Lemons      Review of Systems  Constitutional:  Positive for fatigue. Negative for appetite change.  HENT:  Positive for hearing loss (prior to falls/ passing out). Negative  for congestion, postnasal drip and sore throat.   Eyes: Negative.   Respiratory:  Positive for cough (productive), shortness of breath and wheezing.   Cardiovascular:  Positive for palpitations and leg swelling. Negative for chest pain.  Gastrointestinal:  Positive for abdominal distention. Negative for abdominal pain.  Endocrine: Negative.   Genitourinary: Negative.   Musculoskeletal:  Positive for arthralgias (right knee) and back pain.  Skin: Negative.   Allergic/Immunologic: Negative.   Neurological:  Positive for light-headedness. Negative for dizziness.  Hematological:  Negative for adenopathy. Does not bruise/bleed easily.  Psychiatric/Behavioral:  Positive for sleep disturbance (sleeping on 2 pillows). Negative for dysphoric mood. The patient is not nervous/anxious.      Physical  Exam Vitals and nursing note reviewed.  Constitutional:      Appearance: Normal appearance.  HENT:     Head: Normocephalic and atraumatic.  Cardiovascular:     Rate and Rhythm: Normal rate and regular rhythm.  Pulmonary:     Effort: Pulmonary effort is normal. No respiratory distress.     Breath sounds: No wheezing or rales.  Abdominal:     General: There is distension.  Genitourinary:    General: Normal vulva.  Musculoskeletal:        General: No tenderness.     Cervical back: Normal range of motion and neck supple.     Right lower leg: No tenderness. Edema (2+ pitting) present.     Left lower leg: No tenderness. Edema (2+ pitting) present.  Skin:    General: Skin is warm and dry.  Neurological:     General: No focal deficit present.     Mental Status: She is alert and oriented to person, place, and time.  Psychiatric:        Mood and Affect: Mood normal.        Behavior: Behavior normal.        Thought Content: Thought content normal.     Assessment & Plan:  1: Acute on Chronic heart failure with reduced ejection fraction- - NYHA class III - fluid overloaded today - not weighing daily; reminded to call for an overnight weight gain of > 2 pounds or a weekly weight gain of > 5 pounds - weight 241.2 pounds from last visit here 3 months ago - saw cardiology Brion Aliment) 02/09/22 - saw EP Lalla Brothers) 10/07/21; was to have ICD implanted 10/12/21 but it was cancelled due to anemia - on GDMT of carvedilol, entresto, farxiga and spironolactone  - emphasized the importance of keeping daily fluid intake to 60-64 ounces/ daily; instructed her to measure how many ounces her cup holds - participating in paramedicine program  - says that she's ordered compression socks and is waiting for them to come in the mail; encouraged her to elevate her legs when sitting for long periods of time - BNP 02/09/22 was 859.3  2: HTN- - BP  - saw PCP Caralee Ates) 02/04/22 - BMP 02/16/22 reviewed and showed  sodium 138, potassium 3.9, creatinine 1.24 & GFR 57  3: DM- - A1c 02/10/22 was 9.6% - BG   4: Anemia- - CBC 02/16/22 reviewed and showed hemoglobin 8.6 - received IV iron during recent admission   Medication bottles reviewed.

## 2022-04-05 ENCOUNTER — Ambulatory Visit: Payer: Medicaid Other | Admitting: Family

## 2022-04-05 ENCOUNTER — Telehealth (HOSPITAL_COMMUNITY): Payer: Self-pay

## 2022-04-05 NOTE — Telephone Encounter (Signed)
Spoke with Jaimee today to see if going to her HF clinic appt.  She states her Mom is in Hospice and she is spending as much time as she can with her.  She states her Mom has declined in last few weeks.  She states doing ok and taking her medications.  Advised her will get HF clinic to schedule her in January.  She was appreciative.   [employer] [place] EMT-paramedic [phone]

## 2022-05-10 NOTE — Progress Notes (Deleted)
Patient ID: Molly Howard, female    DOB: 11-29-1985, 37 y.o.   MRN: [id number]  HPI  Ms Carlino is a 37 y/o female with a history of DM, HTN, previous drug use and chronic heart failure.   Echo report from 09/18/21 reviewed and showed an EF of 25-30%. Echo report from 06/30/21 reviewed and showed an EF of 30-35% along with mild MR. Echo report from 03/20/21 reviewed and showed an EF of 25-30% along with severely elevated PA pressure of 70.8 mmHg, mild LAE, mild MR and mild/moderate TR. Echo report from 10/11/20 reviewed and showed an EF of 30-35% along with moderate TR.   Cardiac MRI 05/19/21 showed an EF of 42%  Admitted 02/09/22 due to acute on chronic HF. + cocaine. Discharged after 7 days. Was in the ED 11/28/21 due to HF exacerbation. Given additional diuretic with improvement of symptoms and she was released. Was in the ED 11/10/21 due to hemoglobin of 8.0. Transfusion given and she was released.   She presents today for a follow-up visit with a chief complaint of   Was supposed to have ICD placed but has become anemic and they are wanting this to be corrected prior to insertion.   Past Medical History:  Diagnosis Date   Acid reflux    Chronic HFrEF (heart failure with reduced ejection fraction) ([place])    a. 10/2019 Echo: EF 35-40%, GrII DD; b. 05/2020 Echo: EF 20-25%, glob HK; c. 10/2020 Echo: EF 30-35%, glob HK. GrII DD, Mildly red RV fxn. Mod TR; d.  05/2021 cMRI: EF 42%, no LGE. Nl RV size/fxn.   CKD (chronic kidney disease), stage II    Diabetes mellitus ([place])    H/O medication noncompliance    Hypertension    Microcytic anemia    NICM (nonischemic cardiomyopathy) ([place])    a. 10/2019 Echo: EF 35-40%; b. 10/2019 MV: No ischemia. Small apical defect-->breast attenuation; c. 05/2020 Echo: EF 20-25%; d. 10/2020 Echo: EF 30-35%, glob HK. GrII DD, Mildly red RV fxn. Mod TR; e. 05/2021 cMRI: EF 42%, no LGE. Nl RV size/fxn.   Obesity    Polysubstance abuse ([place])    Past Surgical History:   Procedure Laterality Date   CHOLECYSTECTOMY     HERNIA REPAIR     Family History  Problem Relation Age of Onset   Heart failure Mother        a. onset in her 20s   Diabetes Mother    Hypertension Father    Diabetes Father    Social History   Tobacco Use   Smoking status: Former    Packs/day: 0.50    Types: Cigarettes    Quit date: 2022    Years since quitting: 2.0   Smokeless tobacco: Never  Substance Use Topics   Alcohol use: Not Currently    Comment: ~ 4 shots/day on weekends only   Allergies  Allergen Reactions   No [facility] Food Allergies Rash and Other (See Comments)    Lemons      Review of Systems  Constitutional:  Positive for fatigue. Negative for appetite change.  HENT:  Positive for hearing loss (prior to falls/ passing out). Negative for congestion, postnasal drip and sore throat.   Eyes: Negative.   Respiratory:  Positive for cough (productive), shortness of breath and wheezing.   Cardiovascular:  Positive for palpitations and leg swelling. Negative for chest pain.  Gastrointestinal:  Positive for abdominal distention. Negative for abdominal pain.  Endocrine: Negative.   Genitourinary:  Negative.   Musculoskeletal:  Positive for arthralgias (right knee) and back pain.  Skin: Negative.   Allergic/Immunologic: Negative.   Neurological:  Positive for light-headedness. Negative for dizziness.  Hematological:  Negative for adenopathy. Does not bruise/bleed easily.  Psychiatric/Behavioral:  Positive for sleep disturbance (sleeping on 2 pillows). Negative for dysphoric mood. The patient is not nervous/anxious.      Physical Exam Vitals and nursing note reviewed.  Constitutional:      Appearance: Normal appearance.  HENT:     Head: Normocephalic and atraumatic.  Cardiovascular:     Rate and Rhythm: Normal rate and regular rhythm.  Pulmonary:     Effort: Pulmonary effort is normal. No respiratory distress.     Breath sounds: No wheezing or rales.   Abdominal:     General: There is distension.  Genitourinary:    General: Normal vulva.  Musculoskeletal:        General: No tenderness.     Cervical back: Normal range of motion and neck supple.     Right lower leg: No tenderness. Edema (2+ pitting) present.     Left lower leg: No tenderness. Edema (2+ pitting) present.  Skin:    General: Skin is warm and dry.  Neurological:     General: No focal deficit present.     Mental Status: She is alert and oriented to person, place, and time.  Psychiatric:        Mood and Affect: Mood normal.        Behavior: Behavior normal.        Thought Content: Thought content normal.     Assessment & Plan:  1: Acute on Chronic heart failure with reduced ejection fraction- - NYHA class III - fluid overloaded today - not weighing daily; reminded to call for an overnight weight gain of > 2 pounds or a weekly weight gain of > 5 pounds - weight 241.2 pounds from last visit here 4 months ago - saw cardiology Brion Aliment) 02/09/22 - saw EP Lalla Brothers) 10/07/21; was to have ICD implanted 10/12/21 but it was cancelled due to anemia - on GDMT of carvedilol, entresto, farxiga and spironolactone  - again, emphasized the importance of keeping daily fluid intake to 60-64 ounces/ daily; instructed her to measure how many ounces her cup holds - participating in paramedicine program  - says that she's ordered compression socks and is waiting for them to come in the mail; encouraged her to elevate her legs when sitting for long periods of time - BNP 02/09/22 was 859.3  2: HTN- - BP  - saw PCP Caralee Ates) 02/04/22 - BMP 02/16/22 reviewed and showed sodium 138, potassium 3.9, creatinine 1.24 & GFR 57  3: DM- - A1c 02/10/22 was 9.6% - BG  4: Anemia- - CBC 02/16/22 reviewed and showed hemoglobin 8.6   Medication bottles reviewed.

## 2022-05-11 ENCOUNTER — Ambulatory Visit: Payer: Medicaid Other | Admitting: Family

## 2022-05-12 ENCOUNTER — Other Ambulatory Visit (HOSPITAL_COMMUNITY): Payer: Self-pay

## 2022-05-12 NOTE — Progress Notes (Signed)
Today had a home visit with Molly Howard.  She met me outside, she has dogs inside.  She is asking me about how to turn her Moms service dog into hers.  Advised her she needs to ask her doctor, I am not sure.  Her Mom passed away last month, she appears to be doing ok.  Edema has gone down in lower legs, abdomen is soft.  She living with sister.  She states she has all her medications and she is aware of how to take them.  Reminded her of HF clinic this month and to set up transportation.  She is aware of how to contact her insurance for rides.  She has everything for daily living.  She still has not heard from disability.  It is cold outside so we made visit short.  Will continue to visit for heart failure, diet and medication management.   [place] [phone]

## 2022-05-20 ENCOUNTER — Encounter (HOSPITAL_COMMUNITY): Payer: Self-pay

## 2022-05-20 NOTE — Progress Notes (Signed)
Called her transportation and set up transportation for her PCP appt and HF clinic appt that is coming up in January.   [facility] [phone]

## 2022-05-23 NOTE — Progress Notes (Deleted)
Established Patient Office Visit  Subjective:  Patient ID: Molly Howard, female    DOB: 24-Feb-1986  Age: 37 y.o. MRN: [id number]  CC:  No chief complaint on file.   HPI Molly Howard presents for follow up.  Diabetes Type 2: -First diagnosed when she was a kid and told she was a type 1, but then told she was a type 2 later in life so uncertain  -Last [id number] 9.6% -Medications: Fargixa 10, Lantus 10 units in the am and 15 units in the pm -Patient is compliant with the above medications and reports no side effects.  -Checking BG at home: fasting 250 average, evening 150, went to 70's in the afternoon once a few days ago  -Diet: recently cut out fried foods, tries to eat fruit for snacks but has been drinking Min[place]id Orange Juice  -Exercise: tries to walk but gets short of breath with the heart failure  -Eye exam: April 22 scheduled but did not go - due  -Foot exam: UTD, saw Podiatrist and had nails cut  -Microalbumin: elevated, on Entresto and Farxiga  -Statin: yes -PNA vaccine: Prevnar 23 in 7/22 -Denies symptoms of hypoglycemia, polyuria, polydipsia, numbness extremities, foot ulcers/trauma.   Hypertension/HFrEF/NICM: -Medications: Torsemide 30 once daily, Carvedilol 6.25, Spirnolactone 25, Entresto and KCl 10 -Patient is compliant with above medications and reports no side effects. -Denies any SOB, CP, vision changes, LE edema or symptoms of hypotension -Follows with Cardiology note reviewed from 01/15/22 -Planning for subcutaneous ICD but hemoglobin low  -Last echo 09/18/21 showing reduced EF at 25-30% with global hypokinesis of the left ventricle  -She does endorse today some presyncopal and syncopal episodes.  She states she is passed out twice in the last several months, last of which was last month.  She states she was standing when she heard a "whooshing" sound in her ears and she lost consciousness.  She fell forward onto her face.  Her mother and sister were there to  help her, patient states she was unconscious for 5 to 10 minutes.  The patient states that she does feel unsteady when she stands up and lightheaded when standing at times.  HLD: -Medications: Lipitor 20 mg for one year -Patient is compliant with above medications and reports no side effects.  -Last lipid panel: Lipid Panel     Component Value Date/Time   CHOL 286 (H) 06/12/2021 1354   TRIG 242 (H) 06/12/2021 1354   HDL 74 06/12/2021 1354   CHOLHDL 3.9 06/12/2021 1354   VLDL 24 05/21/2020 0643   LDLCALC 171 (H) 06/12/2021 1354   GERD: -Not currently on daily medications, taking Pepcid as needed   CKD: -Last creatinine 9/23 1.33, GFR 53   Anemia: -Hgb 10.3 in 2/23, has menorrhagia, referred to Gyn who did Pap and vaginal [place]. US showing endometrial polyp and she was given an OCP in March but she has not started it or picked it up from the pharmacy yet.  - last hgb 7/23 8.6% -Having a period once a month - will bleed for 7 days, light for 3 days then heavy the last 4 days. Has to change pads/tampons every 1 hour on heavy days. Does have clots.   -Had been seeing gynecology and was prescribed an OCP, however the patient states she cannot afford this medication -She stating now that her menstrual cycles are more manageable.  Her LMP was a few days ago.  She states her bleeding is much lighter and more  tolerable.  Her usual menstrual cycle lasts about 1 week.  Past Medical History:  Diagnosis Date   Acid reflux    Chronic HFrEF (heart failure with reduced ejection fraction) ([place])    a. 10/2019 Echo: EF 35-40%, GrII DD; b. 05/2020 Echo: EF 20-25%, glob HK; c. 10/2020 Echo: EF 30-35%, glob HK. GrII DD, Mildly red RV fxn. Mod TR; d.  05/2021 cMRI: EF 42%, no LGE. Nl RV size/fxn.   CKD (chronic kidney disease), stage II    Diabetes mellitus ([place])    H/O medication noncompliance    Hypertension    Microcytic anemia    NICM (nonischemic cardiomyopathy) ([place])    a. 10/2019 Echo: EF 35-40%; b.  10/2019 MV: No ischemia. Small apical defect-->breast attenuation; c. 05/2020 Echo: EF 20-25%; d. 10/2020 Echo: EF 30-35%, glob HK. GrII DD, Mildly red RV fxn. Mod TR; e. 05/2021 cMRI: EF 42%, no LGE. Nl RV size/fxn.   Obesity    Polysubstance abuse ([place])     Past Surgical History:  Procedure Laterality Date   CHOLECYSTECTOMY     HERNIA REPAIR      Family History  Problem Relation Age of Onset   Heart failure Mother        a. onset in her 87s   Diabetes Mother    Hypertension Father    Diabetes Father     Social History   Socioeconomic History   Marital status: Single    Spouse name: Not on file   Number of children: Not on file   Years of education: Not on file   Highest education level: Not on file  Occupational History   Not on file  Tobacco Use   Smoking status: Former    Packs/day: 0.50    Types: Cigarettes    Quit date: 2022    Years since quitting: 2.0   Smokeless tobacco: Never  Vaping Use   Vaping Use: Never used  Substance and Sexual Activity   Alcohol use: Not Currently    Comment: ~ 4 shots/day on weekends only   Drug use: Not Currently    Types: Cocaine    Comment: Last day of use was prior to June 2021 hosp.visit.    Sexual activity: Not Currently    Birth control/protection: None  Other Topics Concern   Not on file  Social History Narrative   Not on file   Social Determinants of Health   Financial Resource Strain: High Risk (06/16/2020)   Overall Financial Resource Strain (CARDIA)    Difficulty of Paying Living Expenses: Hard  Food Insecurity: No Food Insecurity (02/10/2022)   Hunger Vital Sign    Worried About Running Out of Food in the Last Year: Never true    Ran Out of Food in the Last Year: Never true  Transportation Needs: No Transportation Needs (02/10/2022)   PRAPARE - Hydrologist (Medical): No    Lack of Transportation (Non-Medical): No  Physical Activity: Not on file  Stress: Not on file  Social  Connections: Not on file  Intimate Partner Violence: Not At Risk (02/10/2022)   Humiliation, Afraid, Rape, and Kick questionnaire    Fear of Current or Ex-Partner: No    Emotionally Abused: No    Physically Abused: No    Sexually Abused: No    Outpatient Medications Prior to Visit  Medication Sig Dispense Refill   atorvastatin (LIPITOR) 20 MG tablet TAKE 1 TABLET BY MOUTH DAILY 90 tablet 3  carvedilol (COREG) 6.25 MG tablet Take 1 tablet (6.25 mg total) by mouth 2 (two) times daily with a meal. 180 tablet 3   dapagliflozin propanediol (FARXIGA) 10 MG TABS tablet Take 1 tablet (10 mg total) by mouth once daily. 90 tablet 3   famotidine (PEPCID) 20 MG tablet Take 1 tablet (20 mg total) by mouth 2 (two) times daily. 90 tablet 3   ferrous sulfate 325 (65 FE) MG tablet Take 1 tablet (325 mg total) by mouth daily with breakfast. 30 tablet 0   insulin glargine (LANTUS SOLOSTAR) 100 UNIT/ML Solostar Pen Take 10 units in the morning and 15 in the evenings. (Patient taking differently: 15 Units 2 (two) times daily. Take 10 units in the morning and 15 in the evenings.) 15 mL PRN   norethindrone (AYGESTIN) 5 MG tablet Take 1 tablet (5 mg total) by mouth daily. (Patient not taking: Reported on 05/12/2022) 90 tablet 0   potassium chloride SA (KLOR-CON M) 20 MEQ tablet Take 0.5 tablets (10 mEq total) by mouth daily. 45 tablet 3   sacubitril-valsartan (ENTRESTO) 97-103 MG Take 1 tablet by mouth 2 (two) times daily. 180 tablet 3   spironolactone (ALDACTONE) 25 MG tablet TAKE 1 TABLET BY MOUTH DAILY 90 tablet 3   torsemide 60 MG TABS Take 60 mg by mouth 2 (two) times daily. 30 tablet 0   No facility-administered medications prior to visit.    Allergies  Allergen Reactions   No [facility] Food Allergies Rash and Other (See Comments)    Lemons    ROS Review of Systems  Constitutional:  Negative for chills and fever.  Respiratory:  Positive for cough and shortness of breath.   Cardiovascular:   Positive for leg swelling. Negative for chest pain.  Gastrointestinal:  Positive for abdominal distention. Negative for diarrhea, nausea and vomiting.  Skin: Negative.   Neurological:  Positive for syncope and light-headedness.      Objective:    Physical Exam Constitutional:      Appearance: Normal appearance.  HENT:     Head: Normocephalic and atraumatic.  Eyes:     Conjunctiva/sclera: Conjunctivae normal.  Cardiovascular:     Rate and Rhythm: Normal rate and regular rhythm.  Pulmonary:     Effort: Pulmonary effort is normal.     Breath sounds: Normal breath sounds. No rales.  Abdominal:     General: There is distension.  Musculoskeletal:     Right lower leg: Edema present.     Left lower leg: Edema present.     Comments: 1+ bilateral lower extremity pitting edema  Skin:    General: Skin is warm and dry.  Neurological:     General: No focal deficit present.     Mental Status: She is alert. Mental status is at baseline.  Psychiatric:        Mood and Affect: Mood normal.        Behavior: Behavior normal.     There were no vitals taken for this visit. Wt Readings from Last 3 Encounters:  02/15/22 223 lb 11.2 oz (101.5 kg)  02/09/22 263 lb (119.3 kg)  02/04/22 252 lb 12.8 oz (114.7 kg)     Health Maintenance Due  Topic Date Due   COVID-19 Vaccine (1) Never done   OPHTHALMOLOGY EXAM  Never done   Diabetic kidney evaluation - Urine ACR  06/12/2022    There are no preventive care reminders to display for this patient.  Lab Results  Component Value Date  TSH 5.114 (H) 11/09/2021   Lab Results  Component Value Date   WBC 6.0 02/16/2022   HGB 8.6 (L) 02/16/2022   HCT 31.5 (L) 02/16/2022   MCV 76.5 (L) 02/16/2022   PLT 435 (H) 02/16/2022   Lab Results  Component Value Date   NA 138 02/16/2022   K 3.9 02/16/2022   CO2 30 02/16/2022   GLUCOSE 168 (H) 02/16/2022   BUN 26 (H) 02/16/2022   CREATININE 1.24 (H) 02/16/2022   BILITOT 0.7 02/09/2022   ALKPHOS  120 02/09/2022   AST 18 02/09/2022   ALT 17 02/09/2022   PROT 6.0 (L) 02/09/2022   ALBUMIN 1.9 (L) 02/14/2022   CALCIUM 8.2 (L) 02/16/2022   ANIONGAP 9 02/16/2022   EGFR 52 (L) 06/12/2021   Lab Results  Component Value Date   CHOL 286 (H) 06/12/2021   Lab Results  Component Value Date   HDL 74 06/12/2021   Lab Results  Component Value Date   LDLCALC 171 (H) 06/12/2021   Lab Results  Component Value Date   TRIG 242 (H) 06/12/2021   Lab Results  Component Value Date   CHOLHDL 3.9 06/12/2021   Lab Results  Component Value Date   HGBA1C 9.6 (H) 02/10/2022      Assessment & Plan:   1. 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  It is too soon to recheck, however we will keep her Lantus the same in the morning at 10 units and increase the evening dose to 15 units.  Refill insulin today.  Follow-up in 1 month for A1c recheck.  - insulin glargine (LANTUS SOLOSTAR) 100 UNIT/ML Solostar Pen; Take 10 units in the morning and 15 in the evenings.  Dispense: 15 mL; Refill: PRN  2. Anemia, unspecified type: Patient states that she cannot afford her OCPs, however her menstrual cycles are much more tolerable.  We will recheck CBC, however if hemoglobin is still low she will require referral to hematology.  - CBC w/Diff/Platelet  3. HFrEF (heart failure with reduced ejection fraction) (H[facility])/Syncope, unspecified syncope type: Heart failure worsening.  She is following with the heart failure clinic, note reviewed from 01/15/2022.  Medications reviewed.  Syncope most likely secondary to a combination of the patient being intravascularly dry due to worsening heart failure as well as anemia.  Discussed signs of orthostatic hypotension, standing and walking slowly.  The patient does live with her mother and sister.  Reiterated importance of following fluid and sodium restriction.  4. Need for influenza  vaccination: Flu vaccine administered today.  - Flu Vaccine QUAD 6+ mos PF IM (Fluarix Quad PF)  Follow-up: No follow-ups on file.    [name], DO

## 2022-05-24 ENCOUNTER — Ambulatory Visit: Payer: Medicaid Other | Admitting: Internal Medicine

## 2022-05-27 ENCOUNTER — Encounter: Payer: Self-pay | Admitting: Pharmacy Technician

## 2022-05-27 ENCOUNTER — Other Ambulatory Visit
Admission: RE | Admit: 2022-05-27 | Discharge: 2022-05-27 | Disposition: A | Discharge status: Home / Self Care | Payer: Medicaid Other | Source: Ambulatory Visit | Attending: Family | Admitting: Family

## 2022-05-27 ENCOUNTER — Emergency Department: Payer: Medicaid Other

## 2022-05-27 ENCOUNTER — Encounter: Payer: Self-pay | Admitting: Family

## 2022-05-27 ENCOUNTER — Ambulatory Visit (HOSPITAL_BASED_OUTPATIENT_CLINIC_OR_DEPARTMENT_OTHER): Payer: Medicaid Other | Admitting: Family

## 2022-05-27 ENCOUNTER — Emergency Department
Admission: EM | Admit: 2022-05-27 | Discharge: 2022-05-27 | Disposition: A | Discharge status: Home / Self Care | Payer: Medicaid Other | Attending: Emergency Medicine | Admitting: Emergency Medicine

## 2022-05-27 ENCOUNTER — Other Ambulatory Visit (HOSPITAL_COMMUNITY): Payer: Self-pay

## 2022-05-27 VITALS — BP 127/83 | HR 96 | Resp 20 | Wt 244.4 lb

## 2022-05-27 DIAGNOSIS — Z794 Long term (current) use of insulin: Secondary | ICD-10-CM

## 2022-05-27 DIAGNOSIS — R059 Cough, unspecified: Secondary | ICD-10-CM | POA: Diagnosis not present

## 2022-05-27 DIAGNOSIS — Z7984 Long term (current) use of oral hypoglycemic drugs: Secondary | ICD-10-CM | POA: Diagnosis not present

## 2022-05-27 DIAGNOSIS — I5022 Chronic systolic (congestive) heart failure: Secondary | ICD-10-CM | POA: Insufficient documentation

## 2022-05-27 DIAGNOSIS — D649 Anemia, unspecified: Secondary | ICD-10-CM | POA: Diagnosis not present

## 2022-05-27 DIAGNOSIS — R42 Dizziness and giddiness: Secondary | ICD-10-CM | POA: Diagnosis not present

## 2022-05-27 DIAGNOSIS — M7989 Other specified soft tissue disorders: Secondary | ICD-10-CM | POA: Insufficient documentation

## 2022-05-27 DIAGNOSIS — E1165 Type 2 diabetes mellitus with hyperglycemia: Secondary | ICD-10-CM

## 2022-05-27 DIAGNOSIS — D509 Iron deficiency anemia, unspecified: Secondary | ICD-10-CM | POA: Diagnosis not present

## 2022-05-27 DIAGNOSIS — M25571 Pain in right ankle and joints of right foot: Secondary | ICD-10-CM | POA: Diagnosis not present

## 2022-05-27 DIAGNOSIS — E119 Type 2 diabetes mellitus without complications: Secondary | ICD-10-CM | POA: Insufficient documentation

## 2022-05-27 DIAGNOSIS — E1122 Type 2 diabetes mellitus with diabetic chronic kidney disease: Secondary | ICD-10-CM | POA: Insufficient documentation

## 2022-05-27 DIAGNOSIS — I5020 Unspecified systolic (congestive) heart failure: Secondary | ICD-10-CM | POA: Insufficient documentation

## 2022-05-27 DIAGNOSIS — M79674 Pain in right toe(s): Secondary | ICD-10-CM | POA: Insufficient documentation

## 2022-05-27 DIAGNOSIS — I110 Hypertensive heart disease with heart failure: Secondary | ICD-10-CM | POA: Diagnosis not present

## 2022-05-27 DIAGNOSIS — I10 Essential (primary) hypertension: Secondary | ICD-10-CM

## 2022-05-27 DIAGNOSIS — I130 Hypertensive heart and chronic kidney disease with heart failure and stage 1 through stage 4 chronic kidney disease, or unspecified chronic kidney disease: Secondary | ICD-10-CM | POA: Insufficient documentation

## 2022-05-27 DIAGNOSIS — N183 Chronic kidney disease, stage 3 unspecified: Secondary | ICD-10-CM | POA: Insufficient documentation

## 2022-05-27 DIAGNOSIS — I509 Heart failure, unspecified: Secondary | ICD-10-CM | POA: Insufficient documentation

## 2022-05-27 DIAGNOSIS — G479 Sleep disorder, unspecified: Secondary | ICD-10-CM | POA: Diagnosis not present

## 2022-05-27 DIAGNOSIS — Z79899 Other long term (current) drug therapy: Secondary | ICD-10-CM | POA: Diagnosis not present

## 2022-05-27 LAB — BASIC METABOLIC PANEL
Anion gap: 9 (ref 5–15)
Anion gap: 11 (ref 5–15)
BUN: 30 mg/dL — ABNORMAL HIGH (ref 6–20)
BUN: 28 mg/dL — ABNORMAL HIGH (ref 6–20)
CO2: 27 mmol/L (ref 22–32)
CO2: 24 mmol/L (ref 22–32)
Calcium: 7.1 mg/dL — ABNORMAL LOW (ref 8.9–10.3)
Calcium: 7.5 mg/dL — ABNORMAL LOW (ref 8.9–10.3)
Chloride: 96 mmol/L — ABNORMAL LOW (ref 98–111)
Chloride: 101 mmol/L (ref 98–111)
Creatinine, Ser: 1.36 mg/dL — ABNORMAL HIGH (ref 0.44–1.00)
Creatinine, Ser: 1.34 mg/dL — ABNORMAL HIGH (ref 0.44–1.00)
GFR, Estimated: 51 mL/min — ABNORMAL LOW (ref >60)
GFR, Estimated: 52 mL/min — ABNORMAL LOW (ref >60)
Glucose, Bld: 259 mg/dL — ABNORMAL HIGH (ref 70–99)
Glucose, Bld: 192 mg/dL — ABNORMAL HIGH (ref 70–99)
Potassium: 3.4 mmol/L — ABNORMAL LOW (ref 3.5–5.1)
Potassium: 3.6 mmol/L (ref 3.5–5.1)
Sodium: 132 mmol/L — ABNORMAL LOW (ref 135–145)
Sodium: 136 mmol/L (ref 135–145)

## 2022-05-27 LAB — CBC
HCT: 39.3 % (ref 36.0–46.0)
Hemoglobin: 11.1 g/dL — ABNORMAL LOW (ref 12.0–15.0)
MCH: 23.1 pg — ABNORMAL LOW (ref 26.0–34.0)
MCHC: 28.2 g/dL — ABNORMAL LOW (ref 30.0–36.0)
MCV: 81.7 fL (ref 80.0–100.0)
Platelets: 407 10*3/uL — ABNORMAL HIGH (ref 150–400)
RBC: 4.81 MIL/uL (ref 3.87–5.11)
RDW: 21.6 % — ABNORMAL HIGH (ref 11.5–15.5)
WBC: 6.4 10*3/uL (ref 4.0–10.5)
nRBC: 0 % (ref 0.0–0.2)

## 2022-05-27 LAB — LIPID PANEL
Cholesterol: 216 mg/dL — ABNORMAL HIGH (ref 0–200)
HDL: 46 mg/dL (ref >40)
LDL Cholesterol: 145 mg/dL — ABNORMAL HIGH (ref 0–99)
Total CHOL/HDL Ratio: 4.7 RATIO
Triglycerides: 124 mg/dL (ref <150)
VLDL: 25 mg/dL (ref 0–40)

## 2022-05-27 LAB — URIC ACID: Uric Acid, Serum: 10 mg/dL — ABNORMAL HIGH (ref 2.5–7.1)

## 2022-05-27 MED ORDER — HYDROCODONE-ACETAMINOPHEN 5-325 MG PO TABS
2.0000 | ORAL_TABLET | Freq: Once | ORAL | Status: AC
  Administered 2022-05-27: 2 via ORAL
  Filled 2022-05-27: qty 2

## 2022-05-27 MED ORDER — IBUPROFEN 600 MG PO TABS
600.0000 mg | ORAL_TABLET | Freq: Once | ORAL | Status: AC
  Administered 2022-05-27: 600 mg via ORAL
  Filled 2022-05-27: qty 1

## 2022-05-27 MED ORDER — HYDROCODONE-ACETAMINOPHEN 5-325 MG PO TABS: 1.0000 | ORAL_TABLET | Freq: Four times a day (QID) | ORAL | 0 refills | Status: DC | PRN

## 2022-05-27 NOTE — ED Triage Notes (Addendum)
Pt sts that for the last two days she has had right ankle pain and swelling. Pt denies any injury. Pt just came from PCP office and pt sts that her PCP thinks that she has gout in the ankle.

## 2022-05-27 NOTE — Progress Notes (Signed)
Patient ID: Molly Howard, female    DOB: 29-Nov-1985, 37 y.o.   MRN: [id number]  HPI  Molly Howard is a 37 y/o female with a history of DM, HTN, previous drug use and chronic heart failure.   Echo report from 09/18/21 reviewed and showed an EF of 25-30%. Echo report from 06/30/21 reviewed and showed an EF of 30-35% along with mild MR. Echo report from 03/20/21 reviewed and showed an EF of 25-30% along with severely elevated PA pressure of 70.8 mmHg, mild LAE, mild MR and mild/moderate TR. Echo report from 10/11/20 reviewed and showed an EF of 30-35% along with moderate TR.   Cardiac MRI 05/19/21 showed an EF of 42%  Admitted 02/09/22 due to acute on chronic HF. + cocaine. Discharged after 7 days. Was in the ED 11/28/21 due to HF exacerbation. Given additional diuretic with improvement of symptoms and she was released.   She presents today for a follow-up visit with a chief complaint of minimal fatigue with moderate exertion. Describes this as chronic in nature. Has associated cough, right foot swelling, light-headedness, difficulty sleeping, right great toe pain and slight weight gain along with this. Denies any abdominal distention, palpitations, chest pain or SOB.   Was supposed to have ICD placed but has become anemic and they are wanting this to be corrected prior to insertion.   Past Medical History:  Diagnosis Date   Acid reflux    Chronic HFrEF (heart failure with reduced ejection fraction) (HCC)    a. 10/2019 Echo: EF 35-40%, GrII DD; b. 05/2020 Echo: EF 20-25%, glob HK; c. 10/2020 Echo: EF 30-35%, glob HK. GrII DD, Mildly red RV fxn. Mod TR; d.  05/2021 cMRI: EF 42%, no LGE. Nl RV size/fxn.   CKD (chronic kidney disease), stage II    Diabetes mellitus (HCC)    H/O medication noncompliance    Hypertension    Microcytic anemia    NICM (nonischemic cardiomyopathy) (HCC)    a. 10/2019 Echo: EF 35-40%; b. 10/2019 MV: No ischemia. Small apical defect-->breast attenuation; c. 05/2020 Echo: EF 20-25%;  d. 10/2020 Echo: EF 30-35%, glob HK. GrII DD, Mildly red RV fxn. Mod TR; e. 05/2021 cMRI: EF 42%, no LGE. Nl RV size/fxn.   Obesity    Polysubstance abuse (HCC)    Past Surgical History:  Procedure Laterality Date   CHOLECYSTECTOMY     HERNIA REPAIR     Family History  Problem Relation Age of Onset   Heart failure Mother        a. onset in her 55s   Diabetes Mother    Hypertension Father    Diabetes Father    Social History   Tobacco Use   Smoking status: Former    Packs/day: 0.50    Types: Cigarettes    Quit date: 2022    Years since quitting: 2.0   Smokeless tobacco: Never  Substance Use Topics   Alcohol use: Not Currently    Comment: ~ 4 shots/day on weekends only   Allergies  Allergen Reactions   No [employer] Food Allergies Rash and Other (See Comments)    Lemons   Prior to Admission medications   Medication Sig Start Date End Date Taking? Authorizing Provider  atorvastatin (LIPITOR) 20 MG tablet TAKE 1 TABLET BY MOUTH DAILY 12/14/21  Yes Clarisa Kindred A, FNP  carvedilol (COREG) 6.25 MG tablet Take 1 tablet (6.25 mg total) by mouth 2 (two) times daily with a meal. 12/11/20  Yes Clarisa Kindred  A, FNP  dapagliflozin propanediol (FARXIGA) 10 MG TABS tablet Take 1 tablet (10 mg total) by mouth once daily. 11/05/21  Yes Darylene Price A, FNP  famotidine (PEPCID) 20 MG tablet Take 1 tablet (20 mg total) by mouth 2 (two) times daily. 06/12/21  Yes [name], DO  ferrous sulfate 325 (65 FE) MG tablet Take 1 tablet (325 mg total) by mouth daily with breakfast. 02/17/22 05/27/22 Yes Amery, Gwynneth Albright, MD  insulin glargine (LANTUS SOLOSTAR) 100 UNIT/ML Solostar Pen Take 10 units in the morning and 15 in the evenings. Patient taking differently: 15 Units 2 (two) times daily. Take 10 units in the morning and 15 in the evenings. 02/04/22  Yes [name], DO  norethindrone (AYGESTIN) 5 MG tablet Take 1 tablet (5 mg total) by mouth daily. 05/03/73  Yes Copland, Alicia B, PA-C   potassium chloride SA (KLOR-CON M) 20 MEQ tablet Take 0.5 tablets (10 mEq total) by mouth daily. 04/29/21  Yes Chase Arnall A, FNP  sacubitril-valsartan (ENTRESTO) 97-103 MG Take 1 tablet by mouth 2 (two) times daily. 07/02/21  Yes [name], MD  torsemide 60 MG TABS Take 60 mg by mouth 2 (two) times daily. 02/16/22 05/27/22 Yes Nolberto Hanlon, MD  spironolactone (ALDACTONE) 25 MG tablet TAKE 1 TABLET BY MOUTH DAILY Patient not taking: Reported on 05/27/2022 12/14/21   Darylene Price A, FNP  Potassium Chloride 40 MEQ/15ML (20%) SOLN Take 40 mEq by mouth 2 (two) times daily. 07/04/20 10/18/20  [name], MD   Review of Systems  Constitutional:  Positive for fatigue. Negative for appetite change.  HENT:  Negative for congestion, postnasal drip and sore throat.   Eyes: Negative.   Respiratory:  Positive for cough. Negative for shortness of breath.   Cardiovascular:  Positive for leg swelling (around right foot). Negative for chest pain and palpitations.  Gastrointestinal:  Negative for abdominal distention and abdominal pain.  Endocrine: Negative.   Genitourinary: Negative.   Musculoskeletal:  Positive for arthralgias (right great toe) and back pain.  Skin: Negative.   Allergic/Immunologic: Negative.   Neurological:  Positive for light-headedness. Negative for dizziness.  Hematological:  Negative for adenopathy. Does not bruise/bleed easily.  Psychiatric/Behavioral:  Positive for sleep disturbance (sleeping on 2 pillows). Negative for dysphoric mood. The patient is not nervous/anxious.    Vitals:   05/27/22 0923  BP: 127/83  Pulse: 96  Resp: 20  SpO2: 99%  Weight: 244 lb 6 oz (110.8 kg)   Wt Readings from Last 3 Encounters:  05/27/22 244 lb 6 oz (110.8 kg)  02/15/22 223 lb 11.2 oz (101.5 kg)  02/09/22 263 lb (119.3 kg)   Lab Results  Component Value Date   CREATININE 1.24 (H) 02/16/2022   CREATININE 1.28 (H) 02/15/2022   CREATININE 1.26 (H) 02/14/2022   Physical  Exam Vitals and nursing note reviewed.  Constitutional:      Appearance: Normal appearance.  HENT:     Head: Normocephalic and atraumatic.  Cardiovascular:     Rate and Rhythm: Normal rate and regular rhythm.  Pulmonary:     Effort: Pulmonary effort is normal. No respiratory distress.     Breath sounds: No wheezing or rales.  Abdominal:     General: There is no distension.  Genitourinary:    General: Normal vulva.  Musculoskeletal:        General: Tenderness (right foot/ right great toe) present.     Cervical back: Normal range of motion and neck supple.     Right lower  leg: No tenderness. Edema (trace pitting) present.     Left lower leg: No tenderness. Edema (trace pitting) present.  Skin:    General: Skin is warm and dry.  Neurological:     General: No focal deficit present.     Mental Status: She is alert and oriented to person, place, and time.  Psychiatric:        Mood and Affect: Mood normal.        Behavior: Behavior normal.        Thought Content: Thought content normal.     Assessment & Plan:  1: Chronic heart failure with reduced ejection fraction- - NYHA class II - euvolemic - not weighing daily; reminded to call for an overnight weight gain of > 2 pounds or a weekly weight gain of > 5 pounds - weight up 3 pounds from last visit here 4 months ago - saw cardiology Molly Howard) 02/09/22 - saw EP Quentin Ore) 10/07/21; was to have ICD implanted 10/12/21 but it was cancelled due to anemia - on GDMT of carvedilol, entresto & farxiga - says that she was told by cardiology in the past to stop spironolactone - check BMP/ lipid panel today; depending on BMP, will resume spiro but will call her once results are back - participating in paramedicine program  - BNP 02/09/22 was 859.3 - PharmD reconciled meds w/ patient  2: HTN- - BP 127/83 - saw PCP Rosana Berger) 02/04/22 - BMP 02/16/22 reviewed and showed sodium 138, potassium 3.9, creatinine 1.24 & GFR 57  3: DM- - A1c 02/10/22  was 9.6%  4: Anemia- - CBC 02/16/22 reviewed and showed hemoglobin 8.6  Advised her to call PCP regarding right great toe pain as it sounds like gout. She says that she has recently eaten shrimp/ pintos.   Medication list reviewed.   Return in 1 week, sooner if needed. Check BMP in 1 week if we resume spironolactone

## 2022-05-27 NOTE — Patient Instructions (Signed)
Continue weighing daily and call for an overnight weight gain of 3 pounds or more or a weekly weight gain of more than 5 pounds.   If you have voicemail, please make sure your mailbox is cleaned out so that we may leave a message and please make sure to listen to any voicemails.     

## 2022-05-27 NOTE — Progress Notes (Signed)
Molly Howard - PHARMACIST COUNSELING NOTE  Guideline-Directed Medical Therapy/Evidence Based Medicine  ACE/ARB/ARNI: Sacubitril-valsartan 97-103 mg twice daily Beta Blocker: Carvedilol 6.25 mg twice daily Aldosterone Antagonist:  None Diuretic: Torsemide 60 mg twice daily SGLT2i: Dapagliflozin 10 mg daily  Adherence Assessment  Do you ever forget to take your medication? [] Yes [x] No  Do you ever skip doses due to side effects? [x] Yes [] No  Do you have trouble affording your medicines? [x] Yes [] No  Are you ever unable to pick up your medication due to transportation difficulties? [] Yes [x] No  Do you ever stop taking your medications because you don't believe they are helping? [] Yes [x] No  Do you check your weight daily? [] Yes [x] No   Adherence strategy: Does not use a pillbox  Barriers to obtaining medications: Has Medicaid, but sometimes has trouble affording the copays  Vital signs: HR 08/2021, BP 127/83, weight (pounds) 244 ECHO: Date 08/2021, EF 25-30%     Latest Ref Rng & Units 05/27/2022    3:16 PM 05/27/2022   10:22 AM 02/16/2022    4:47 AM  BMP  Glucose 70 - 99 mg/dL 192  259  168   BUN 6 - 20 mg/dL 28  30  26    Creatinine 0.44 - 1.00 mg/dL 1.34  1.36  1.24   Sodium 135 - 145 mmol/L 136  132  138   Potassium 3.5 - 5.1 mmol/L 3.6  3.4  3.9   Chloride 98 - 111 mmol/L 101  96  99   CO2 22 - 32 mmol/L 24  27  30    Calcium 8.9 - 10.3 mg/dL 7.5  7.1  8.2     Past Medical History:  Diagnosis Date   Acid reflux    Chronic HFrEF (heart failure with reduced ejection fraction) ([place])    a. 10/2019 Echo: EF 35-40%, GrII DD; b. 05/2020 Echo: EF 20-25%, glob HK; c. 10/2020 Echo: EF 30-35%, glob HK. GrII DD, Mildly red RV fxn. Mod TR; d.  05/2021 cMRI: EF 42%, no LGE. Nl RV size/fxn.   CKD (chronic kidney disease), stage II    Diabetes mellitus ([place])    H/O medication noncompliance    Hypertension    Microcytic anemia    NICM  (nonischemic cardiomyopathy) ([place])    a. 10/2019 Echo: EF 35-40%; b. 10/2019 MV: No ischemia. Small apical defect-->breast attenuation; c. 05/2020 Echo: EF 20-25%; d. 10/2020 Echo: EF 30-35%, glob HK. GrII DD, Mildly red RV fxn. Mod TR; e. 05/2021 cMRI: EF 42%, no LGE. Nl RV size/fxn.   Obesity    Polysubstance abuse [facility])     ASSESSMENT 37 year old female with PMH HTN, T2DM, CKD2, HLD, cocaine use who presents to the HF clinic for follow-up. Most recent ECHO in 08/2021 shows EF 25-30%. Regarding GDMT, patient takes Entresto 97/103mg  twice daily, Farxiga 10 mg daily, and carvedilol 6.25 mg twice daily. Patient had been taking spironolactone but asserts that she was told by cardiology to stop. Patient also takes torsemide 60 mg twice daily. Patient endorses that she is sometimes uncomfortable taking torsemide twice daily due to fears that it might harm her kidneys. Patient also is complaining of pain in the tip of her toe, concerning for gout.  Update: Lab results back. Scr today is 1.34, in according with patient's baseline Scr 1.1-1.3.  Recent ED Visit (past 6 months): Update: Patient went to ED shortly after her visit today due to the pain in her toe Date - 01/2022,  CC - AoCHF  PLAN CHF/HTN Recommend rechecking BMP today and resuming spironolactone if potassium and serum creatinine are WNL and trending appropriately Update: Scr today 1.34, upper end of her baseiine 1.1-1.3. Recommend restarting spironolactone at 12.5 mg daily and checking BMP in one week Continue Entresto, Farxiga, carvedilol, and torsemide  HLD 05/2022 LDL 145 Recommend increasing atorvastatin to 40 mg daily when she returns for her BMP after re-initiating spironolactone  T2DM 01/2022 A1c 9.6% Continue Lantus and Farxiga  Time spent: 20 minutes  Will M. Ouida Sills, PharmD PGY-1 Pharmacy Resident 05/27/2022 5:26 PM   Current Outpatient Medications:    atorvastatin (LIPITOR) 20 MG tablet, TAKE 1 TABLET BY MOUTH DAILY,  Disp: 90 tablet, Rfl: 3   carvedilol (COREG) 6.25 MG tablet, Take 1 tablet (6.25 mg total) by mouth 2 (two) times daily with a meal., Disp: 180 tablet, Rfl: 3   dapagliflozin propanediol (FARXIGA) 10 MG TABS tablet, Take 1 tablet (10 mg total) by mouth once daily., Disp: 90 tablet, Rfl: 3   famotidine (PEPCID) 20 MG tablet, Take 1 tablet (20 mg total) by mouth 2 (two) times daily., Disp: 90 tablet, Rfl: 3   ferrous sulfate 325 (65 FE) MG tablet, Take 1 tablet (325 mg total) by mouth daily with breakfast., Disp: 30 tablet, Rfl: 0   HYDROcodone-acetaminophen (NORCO/VICODIN) 5-325 MG tablet, Take 1-2 tablets by mouth every 6 (six) hours as needed for moderate pain., Disp: 20 tablet, Rfl: 0   insulin glargine (LANTUS SOLOSTAR) 100 UNIT/ML Solostar Pen, Take 10 units in the morning and 15 in the evenings. (Patient taking differently: 15 Units 2 (two) times daily. Take 10 units in the morning and 15 in the evenings.), Disp: 15 mL, Rfl: PRN   norethindrone (AYGESTIN) 5 MG tablet, Take 1 tablet (5 mg total) by mouth daily., Disp: 90 tablet, Rfl: 0   potassium chloride SA (KLOR-CON M) 20 MEQ tablet, Take 0.5 tablets (10 mEq total) by mouth daily., Disp: 45 tablet, Rfl: 3   sacubitril-valsartan (ENTRESTO) 97-103 MG, Take 1 tablet by mouth 2 (two) times daily., Disp: 180 tablet, Rfl: 3   spironolactone (ALDACTONE) 25 MG tablet, TAKE 1 TABLET BY MOUTH DAILY (Patient not taking: Reported on 05/27/2022), Disp: 90 tablet, Rfl: 3   torsemide 60 MG TABS, Take 60 mg by mouth 2 (two) times daily., Disp: 30 tablet, Rfl: 0   DRUGS TO CAUTION IN HEART FAILURE  Drug or Class Mechanism  Analgesics NSAIDs COX-2 inhibitors Glucocorticoids  Sodium and water retention, increased systemic vascular resistance, decreased response to diuretics   Diabetes Medications Metformin Thiazolidinediones Rosiglitazone (Avandia) Pioglitazone (Actos) DPP4 Inhibitors Saxagliptin (Onglyza) Sitagliptin (Januvia)   Lactic  acidosis Possible calcium channel blockade   Unknown  Antiarrhythmics Class I  Flecainide Disopyramide Class III Sotalol Other Dronedarone  Negative inotrope, proarrhythmic   Proarrhythmic, beta blockade  Negative inotrope  Antihypertensives Alpha Blockers Doxazosin Calcium Channel Blockers Diltiazem Verapamil Nifedipine Central Alpha Adrenergics Moxonidine Peripheral Vasodilators Minoxidil  Increases renin and aldosterone  Negative inotrope    Possible sympathetic withdrawal  Unknown  Anti-infective Itraconazole Amphotericin B  Negative inotrope Unknown  Hematologic Anagrelide Cilostazol   Possible inhibition of PD IV Inhibition of PD III causing arrhythmias  Neurologic/Psychiatric Stimulants Anti-Seizure Drugs Carbamazepine Pregabalin Antidepressants Tricyclics Citalopram Parkinsons Bromocriptine Pergolide Pramipexole Antipsychotics Clozapine Antimigraine Ergotamine Methysergide Appetite suppressants Bipolar Lithium  Peripheral alpha and beta agonist activity  Negative inotrope and chronotrope Calcium channel blockade  Negative inotrope, proarrhythmic Dose-dependent QT prolongation  Excessive serotonin activity/valvular damage Excessive serotonin  activity/valvular damage Unknown  IgE mediated hypersensitivy, calcium channel blockade  Excessive serotonin activity/valvular damage Excessive serotonin activity/valvular damage Valvular damage  Direct myofibrillar degeneration, adrenergic stimulation  Antimalarials Chloroquine Hydroxychloroquine Intracellular inhibition of lysosomal enzymes  Urologic Agents Alpha Blockers Doxazosin Prazosin Tamsulosin Terazosin  Increased renin and aldosterone  Adapted from Page Williemae Natter, et al. "Drugs That May Cause or Exacerbate Heart Failure: A Scientific Statement from the American Heart  Association." Circulation 2016; 134:e32-e69. DOI: 10.1161/CIR.0000000000000426   MEDICATION  ADHERENCES TIPS AND STRATEGIES Taking medication as prescribed improves patient outcomes in heart failure (reduces hospitalizations, improves symptoms, increases survival) Side effects of medications can be managed by decreasing doses, switching agents, stopping drugs, or adding additional therapy. Please let someone in the Heart Failure Clinic know if you have having bothersome side effects so we can modify your regimen. Do not alter your medication regimen without talking to [place].  Medication reminders can help patients remember to take drugs on time. If you are missing or forgetting doses you can try linking behaviors, using pill boxes, or an electronic reminder like an alarm on your phone or an app. Some people can also get automated phone calls as medication reminders.

## 2022-05-27 NOTE — Progress Notes (Signed)
Spoke with Molly Howard and she said she was going to pharmacy to get her prescription for her foot.  She states did not have much money but should be able to get it.  She already has an appt with her PCP that transportation is already scheduled for.  Advised her to rest, she said she will.    [place] EMT-paramedic [phone]

## 2022-05-27 NOTE — ED Notes (Signed)
Pt reports rt ankle pain x 2 days- denies injury.

## 2022-05-27 NOTE — ED Provider Notes (Signed)
[facility] Provider Note    Event Date/Time   First MD Initiated Contact with Patient 05/27/22 1323     (approximate)   History   Ankle Pain   HPI  Molly Howard is a 37 y.o. female history of congestive heart failure, hypertension, diabetes  Patient was seen for follow-up visit with congestive heart failure clinic today.  At the visit she reported symptoms of pain in her right foot in her great toe.   Patient reports that for about a day and a half she has been having some pain with use of the right foot in particular pain in the joints of the right great toe.  She reports that the foot seems just a little swollen in the area.  She has had no fevers the area has not looked red.  There is been no skin changes.  It hurts to walk on it and to bend.  No trauma or injury  She was told at the doctor's office that it might be "gout"     Physical Exam   Triage Vital Signs: ED Triage Vitals  Enc Vitals Group     BP 05/27/22 1138 (!) 135/102     Pulse Rate 05/27/22 1138 94     Resp 05/27/22 1138 18     Temp 05/27/22 1138 98.1 F (36.7 C)     Temp Source 05/27/22 1138 Oral     SpO2 05/27/22 1138 99 %     Weight 05/27/22 1139 244 lb (110.7 kg)     Height --      Head Circumference --      Peak Flow --      Pain Score 05/27/22 1139 10     Pain Loc --      Pain Edu? --      Excl. in [place]? --     Most recent vital signs: Vitals:   05/27/22 1138  BP: (!) 135/102  Pulse: 94  Resp: 18  Temp: 98.1 F (36.7 C)  SpO2: 99%     General: Awake, no distress.  Very pleasant resting comfortably. CV:  Good peripheral perfusion.  Resp:  Normal effort.  Abd:  No distention.  Other:  Mild edema of the right foot that does not extend past the foot itself.  No notable peripheral edema of the overlying ankle.  Her right great toe to inspection does not appear erythematous or swollen.  She does however report that through the range of motion that there is pain  that isolates towards the metatarsophalangeal joint of the right fifth toe.  She is able to use and bend her other toes without pain or discomfort.  She has slight diminished sensation attributable to chronic neuropathy.  No erythema.  No major focal swelling over any particular joint but the right foot does seem slightly edematous in general.  She has a strong easily palpable dorsalis pedis pulse on the right foot and her  toes do not feel cold or ischemic   ED Results / Procedures / Treatments   Labs (all labs ordered are listed, but only abnormal results are displayed) Labs Reviewed  CBC  URIC ACID     EKG     RADIOLOGY  X-rays of the right foot interpreted by me as negative for acute fracture..  No findings that would be concerning for gas formation or osteomyelitis   PROCEDURES:  Critical Care performed: No  Procedures   MEDICATIONS ORDERED IN ED: Medications  HYDROcodone-acetaminophen (  NORCO/VICODIN) 5-325 MG per tablet 2 tablet (has no administration in time range)     IMPRESSION / MDM / ASSESSMENT AND PLAN / ED COURSE  I reviewed the triage vital signs and the nursing notes.                              Differential diagnosis includes, but is not limited to, possible arthropathy, ligamentous injury, musculoskeletal injury, crystalloid disease and felt less likely septic joint.  The patient does not have a fever, she does not have overlying erythema and examination inspection of the right foot seem to isolate pain to the right metatarsal phalangeal joint.  She has preserved vascular function.  There is no evidence of ischemia.  In this clinical setting, it seems most likely with her risk factors that this may represent gout or crystalloid disease, I have a low suspicion for septic joint.  Believe the benefits vof attempted joint aspiration are outweighed by the risks at this juncture.  She does have mild renal disease.  Somewhat contraindicated to initiate  colchicine.  She is also diabetic and feels some potential contraindication to use of steroid.  In her particular instance it may be most helpful to treat with NSAIDs and prescription hydrocodone.  Discussed with patient who is understanding agreeable with this plan.  CBC and uric acid level pending, I think if her uric acid level is high this would be highly indicative of possible gout, but if normal feels still likely gout.  Discussed with patient careful return precautions and she is agreeable with this plan  Patient's presentation is most consistent with acute, uncomplicated illness.   ----------------------------------------- 3:41 PM on 05/27/2022 ----------------------------------------- Patient reports good relief of her pain after hydrocodone.  Will provide short prescription, also see contraindications to prednisone and colchicine.  Patient comfortable with plan to follow-up with her primary care doctor.   CBC returned shows normal white count, very mild anemia.  Uric acid level sent, PCP can follow-up on this result.  Will discharge the patient home, presumptively gout but careful return precautions advised  Return precautions and treatment recommendations and follow-up discussed with the patient who is agreeable with the plan.        FINAL CLINICAL IMPRESSION(S) / ED DIAGNOSES   Final diagnoses:  Toe joint pain, right     Rx / DC Orders   ED Discharge Orders     None        Note:  This document was prepared using Dragon voice recognition software and may include unintentional dictation errors.   Delman Kitten, MD 05/27/22 [phone]

## 2022-05-28 ENCOUNTER — Telehealth: Payer: Self-pay | Admitting: Family

## 2022-05-28 ENCOUNTER — Other Ambulatory Visit: Payer: Self-pay | Admitting: Family

## 2022-05-28 MED ORDER — SPIRONOLACTONE 25 MG PO TABS: 12.5000 mg | ORAL_TABLET | Freq: Every day | ORAL | 3 refills | Status: DC

## 2022-05-28 NOTE — Telephone Encounter (Signed)
LM regarding BMP results. Will start spironolactone 12.5mg  daily with lab work checked next week at her next appt.

## 2022-05-31 ENCOUNTER — Telehealth (HOSPITAL_COMMUNITY): Payer: Self-pay

## 2022-05-31 NOTE — Telephone Encounter (Signed)
Spoke to Lambert about HF clinic adding a medication and is waiting for her at her pharmacy.  She states will pick it up today or tomorrow.  Advised her it was spironolactone and to take 1/2 a pill.  She repeated back and appears to understand.  She states had a good birthday last week and doing well.    [place] EMT-paramedic [phone]

## 2022-06-03 NOTE — Progress Notes (Deleted)
Patient ID: Molly Howard, female    DOB: [birth year], 37 y.o.   MRN: [id number]  HPI  Molly Howard is a 37 y/o female with a history of DM, HTN, previous drug use and chronic heart failure.   Echo report from 09/18/21 reviewed and showed an EF of 25-30%. Echo report from 06/30/21 reviewed and showed an EF of 30-35% along with mild MR. Echo report from 03/20/21 reviewed and showed an EF of 25-30% along with severely elevated PA pressure of 70.8 mmHg, mild LAE, mild MR and mild/moderate TR. Echo report from 10/11/20 reviewed and showed an EF of 30-35% along with moderate TR.   Cardiac MRI 05/19/21 showed an EF of 42%  Was in the ED 05/27/22 due to gout. Admitted 02/09/22 due to acute on chronic HF. + cocaine. Discharged after 7 days. Was in the ED 11/28/21 due to HF exacerbation. Given additional diuretic with improvement of symptoms and she was released.   She presents today for a follow-up visit with a chief complaint of   Past Medical History:  Diagnosis Date   Acid reflux    Chronic HFrEF (heart failure with reduced ejection fraction) ([place])    a. 10/2019 Echo: EF 35-40%, GrII DD; b. 05/2020 Echo: EF 20-25%, glob HK; c. 10/2020 Echo: EF 30-35%, glob HK. GrII DD, Mildly red RV fxn. Mod TR; d.  05/2021 cMRI: EF 42%, no LGE. Nl RV size/fxn.   CKD (chronic kidney disease), stage II    Diabetes mellitus ([place])    H/O medication noncompliance    Hypertension    Microcytic anemia    NICM (nonischemic cardiomyopathy) ([place])    a. 10/2019 Echo: EF 35-40%; b. 10/2019 MV: No ischemia. Small apical defect-->breast attenuation; c. 05/2020 Echo: EF 20-25%; d. 10/2020 Echo: EF 30-35%, glob HK. GrII DD, Mildly red RV fxn. Mod TR; e. 05/2021 cMRI: EF 42%, no LGE. Nl RV size/fxn.   Obesity    Polysubstance abuse ([place])    Past Surgical History:  Procedure Laterality Date   CHOLECYSTECTOMY     HERNIA REPAIR     Family History  Problem Relation Age of Onset   Heart failure Mother        a. onset in her 66s   Diabetes  Mother    Hypertension Father    Diabetes Father    Social History   Tobacco Use   Smoking status: Former    Packs/day: 0.50    Types: Cigarettes    Quit date: 2022    Years since quitting: 2.0   Smokeless tobacco: Never  Substance Use Topics   Alcohol use: Not Currently    Comment: ~ 4 shots/day on weekends only   Allergies  Allergen Reactions   No [employer] Food Allergies Rash and Other (See Comments)    Lemons    Review of Systems  Constitutional:  Positive for fatigue. Negative for appetite change.  HENT:  Negative for congestion, postnasal drip and sore throat.   Eyes: Negative.   Respiratory:  Positive for cough. Negative for shortness of breath.   Cardiovascular:  Positive for leg swelling (around right foot). Negative for chest pain and palpitations.  Gastrointestinal:  Negative for abdominal distention and abdominal pain.  Endocrine: Negative.   Genitourinary: Negative.   Musculoskeletal:  Positive for arthralgias (right great toe) and back pain.  Skin: Negative.   Allergic/Immunologic: Negative.   Neurological:  Positive for light-headedness. Negative for dizziness.  Hematological:  Negative for adenopathy. Does not bruise/bleed easily.  Psychiatric/Behavioral:  Positive for sleep disturbance (sleeping on 2 pillows). Negative for dysphoric mood. The patient is not nervous/anxious.      Physical Exam Vitals and nursing note reviewed.  Constitutional:      Appearance: Normal appearance.  HENT:     Head: Normocephalic and atraumatic.  Cardiovascular:     Rate and Rhythm: Normal rate and regular rhythm.  Pulmonary:     Effort: Pulmonary effort is normal. No respiratory distress.     Breath sounds: No wheezing or rales.  Abdominal:     General: There is no distension.  Genitourinary:    General: Normal vulva.  Musculoskeletal:        General: Tenderness (right foot/ right great toe) present.     Cervical back: Normal range of motion and neck supple.      Right lower leg: No tenderness. Edema (trace pitting) present.     Left lower leg: No tenderness. Edema (trace pitting) present.  Skin:    General: Skin is warm and dry.  Neurological:     General: No focal deficit present.     Mental Status: She is alert and oriented to person, place, and time.  Psychiatric:        Mood and Affect: Mood normal.        Behavior: Behavior normal.        Thought Content: Thought content normal.     Assessment & Plan:  1: Chronic heart failure with reduced ejection fraction- - NYHA class II - euvolemic - not weighing daily; reminded to call for an overnight weight gain of > 2 pounds or a weekly weight gain of > 5 pounds - weight 244.6 pounds from last visit here 1 week ago - saw cardiology Sharolyn Douglas) 02/09/22 - saw EP Quentin Ore) 10/07/21; was to have ICD implanted 10/12/21 but it was cancelled due to anemia - on GDMT of carvedilol, entresto, farxiga & spironolactone - BMP today as spiro 12.'5mg'$  started at last visit - participating in paramedicine program  - lipid panel on 05/27/22 showed LDL 145 - BNP 02/09/22 was 859.3 - PharmD reconciled meds w/ patient  2: HTN- - BP  - saw PCP Rosana Berger) 02/04/22 - BMP 05/27/22 reviewed and showed sodium 136, potassium 3.6, creatinine 1.34 & GFR 52  3: DM- - A1c 02/10/22 was 9.6%  4: Anemia- - CBC 02/16/22 reviewed and showed hemoglobin 8.6

## 2022-06-04 ENCOUNTER — Encounter: Payer: Medicaid Other | Admitting: Family

## 2022-06-04 ENCOUNTER — Telehealth: Payer: Self-pay | Admitting: Family

## 2022-06-04 NOTE — Telephone Encounter (Signed)
Patient did not show for her [facility] appointment on 06/04/22. Will attempt to reschedule.   

## 2022-06-08 ENCOUNTER — Encounter: Payer: Medicaid Other | Admitting: Family

## 2022-06-08 ENCOUNTER — Telehealth: Payer: Self-pay | Admitting: Family

## 2022-06-08 ENCOUNTER — Other Ambulatory Visit: Payer: Self-pay

## 2022-06-08 ENCOUNTER — Emergency Department
Admission: EM | Admit: 2022-06-08 | Discharge: 2022-06-08 | Disposition: A | Discharge status: Home / Self Care | Payer: Medicaid Other | Attending: Emergency Medicine | Admitting: Emergency Medicine

## 2022-06-08 ENCOUNTER — Encounter: Payer: Self-pay | Admitting: Emergency Medicine

## 2022-06-08 DIAGNOSIS — I130 Hypertensive heart and chronic kidney disease with heart failure and stage 1 through stage 4 chronic kidney disease, or unspecified chronic kidney disease: Secondary | ICD-10-CM | POA: Insufficient documentation

## 2022-06-08 DIAGNOSIS — I509 Heart failure, unspecified: Secondary | ICD-10-CM | POA: Diagnosis not present

## 2022-06-08 DIAGNOSIS — R059 Cough, unspecified: Secondary | ICD-10-CM | POA: Diagnosis present

## 2022-06-08 DIAGNOSIS — E1122 Type 2 diabetes mellitus with diabetic chronic kidney disease: Secondary | ICD-10-CM | POA: Diagnosis not present

## 2022-06-08 DIAGNOSIS — J101 Influenza due to other identified influenza virus with other respiratory manifestations: Secondary | ICD-10-CM | POA: Insufficient documentation

## 2022-06-08 DIAGNOSIS — N189 Chronic kidney disease, unspecified: Secondary | ICD-10-CM | POA: Insufficient documentation

## 2022-06-08 DIAGNOSIS — Z1152 Encounter for screening for COVID-19: Secondary | ICD-10-CM | POA: Insufficient documentation

## 2022-06-08 LAB — RESP PANEL BY RT-PCR (RSV, FLU A&B, COVID)  RVPGX2
Influenza A by PCR: POSITIVE — AB
Influenza B by PCR: NEGATIVE
Resp Syncytial Virus by PCR: NEGATIVE
SARS Coronavirus 2 by RT PCR: NEGATIVE

## 2022-06-08 MED ORDER — BENZONATATE 100 MG PO CAPS: ORAL_CAPSULE | ORAL | 0 refills | Status: DC

## 2022-06-08 MED ORDER — HYDROCOD POLI-CHLORPHE POLI ER 10-8 MG/5ML PO SUER: 5.0000 mL | Freq: Two times a day (BID) | ORAL | 0 refills | Status: DC | PRN

## 2022-06-08 MED ORDER — OSELTAMIVIR PHOSPHATE 75 MG PO CAPS: 75.0000 mg | ORAL_CAPSULE | Freq: Two times a day (BID) | ORAL | 0 refills | Status: AC

## 2022-06-08 MED ORDER — ALBUTEROL SULFATE HFA 108 (90 BASE) MCG/ACT IN AERS: 2.0000 | INHALATION_SPRAY | Freq: Four times a day (QID) | RESPIRATORY_TRACT | 2 refills | Status: DC | PRN

## 2022-06-08 MED ORDER — ONDANSETRON 4 MG PO TBDP: 4.0000 mg | ORAL_TABLET | Freq: Three times a day (TID) | ORAL | 0 refills | Status: DC | PRN

## 2022-06-08 NOTE — Telephone Encounter (Signed)
Patient did not show for her [facility] Failure [facility] appointment on 06/08/22. Will attempt to reschedule.   

## 2022-06-08 NOTE — ED Notes (Signed)
Pt signed printed d/c paperwork.  

## 2022-06-08 NOTE — ED Notes (Signed)
See triage note. Pt resting calmly in recliner; pt does display strong dry cough; resp reg/unlabored, skin dry.

## 2022-06-08 NOTE — Telephone Encounter (Signed)
LVM with patient trying to schedule a time next week to come in for labs as we have not been able to get her up here and if we dont get labs drawn next week she will be taking off spirolactone per Otila Kluver.   Molly Howard, NT

## 2022-06-08 NOTE — ED Triage Notes (Signed)
Pt via POV from home. Pt c/o cough and nasal congestion since yesterday. Denies fever. Denies pain. Pt is A&OX4 and NAD

## 2022-06-08 NOTE — ED Provider Notes (Signed)
[facility] Emergency Department Provider Note     Event Date/Time   First MD Initiated Contact with Patient 06/08/22 1133     (approximate)   History   Cough   HPI  Molly Howard is a 37 y.o. female with a history of CHF, hypertension, diabetes, obesity, CKD, presents to the ED with complaints of cough and congestion with onset of symptoms yesterday.  Patient denies intermittent fevers.  Denies any frank chest pain or shortness of breath.     Physical Exam   Triage Vital Signs: ED Triage Vitals [06/08/22 1029]  Enc Vitals Group     BP (!) 134/100     Pulse Rate (!) 101     Resp 20     Temp 97.8 F (36.6 C)     Temp Source Oral     SpO2 98 %     Weight 240 lb (108.9 kg)     Height 5\' 7"  (1.702 m)     Head Circumference      Peak Flow      Pain Score 0     Pain Loc      Pain Edu?      Excl. in [place]?     Most recent vital signs: Vitals:   06/08/22 1029  BP: (!) 134/100  Pulse: (!) 101  Resp: 20  Temp: 97.8 F (36.6 C)  SpO2: 98%    General Awake, no distress. NAD HEENT NCAT. PERRL. EOMI. No rhinorrhea. Mucous membranes are moist.  CV:  Good peripheral perfusion.  RESP:  Normal effort. Intermittent cough noted ABD:  No distention. Soft, nontender   ED Results / Procedures / Treatments   Labs (all labs ordered are listed, but only abnormal results are displayed) Labs Reviewed  RESP PANEL BY RT-PCR (RSV, FLU A&B, COVID)  RVPGX2 - Abnormal; Notable for the following components:      Result Value   Influenza A by PCR POSITIVE (*)    All other components within normal limits     EKG    RADIOLOGY  I personally viewed and evaluated these images as part of my medical decision making, as well as reviewing the written report by the radiologist.  ED Provider Interpretation:   No results found.   PROCEDURES:  Critical Care performed: No  Procedures   MEDICATIONS ORDERED IN ED: Medications - No data to  display   IMPRESSION / MDM / [place] / ED COURSE  I reviewed the triage vital signs and the nursing notes.                              Differential diagnosis includes, but is not limited to, COVID, flu, RSV, viral URI, AOM  Patient's presentation is most consistent with acute complicated illness / injury requiring diagnostic workup.  Patient's diagnosis is consistent with influenza. Patient will be discharged home with prescriptions for Tessalon Perles, Tussionex, albuterol inhaler, Tamiflu, and Zofran. Patient is to follow up with her PCP as needed or otherwise directed. Patient is given ED precautions to return to the ED for any worsening or new symptoms.     FINAL CLINICAL IMPRESSION(S) / ED DIAGNOSES   Final diagnoses:  Influenza A     Rx / DC Orders   ED Discharge Orders          Ordered    chlorpheniramine-HYDROcodone (TUSSIONEX) 10-8 MG/5ML  Every 12 hours PRN  06/08/22 1154    benzonatate (TESSALON PERLES) 100 MG capsule        06/08/22 1154    oseltamivir (TAMIFLU) 75 MG capsule  2 times daily        06/08/22 1154    albuterol (VENTOLIN HFA) 108 (90 Base) MCG/ACT inhaler  Every 6 hours PRN        06/08/22 1154    ondansetron (ZOFRAN-ODT) 4 MG disintegrating tablet  Every 8 hours PRN        06/08/22 1154             Note:  This document was prepared using Dragon voice recognition software and may include unintentional dictation errors.    Melvenia Needles, PA-C 06/08/22 1156    Harvest Dark, MD 06/08/22 [phone]

## 2022-06-08 NOTE — Discharge Instructions (Addendum)
Take the prescription meds as directed including Tamiflu.  Continue to take OTC Tylenol or Motrin as needed for body aches and fevers.  Rest, hydrate, and follow-up with your primary provider for ongoing symptoms.  Return to the ED if necessary.

## 2022-07-24 ENCOUNTER — Other Ambulatory Visit: Payer: Self-pay

## 2022-07-24 ENCOUNTER — Inpatient Hospital Stay
Admission: EM | Admit: 2022-07-24 | Discharge: 2022-08-04 | DRG: 286 | Disposition: A | Discharge status: Home / Self Care | Payer: Medicaid Other | Attending: Internal Medicine | Admitting: Internal Medicine

## 2022-07-24 ENCOUNTER — Emergency Department: Payer: Medicaid Other

## 2022-07-24 DIAGNOSIS — E1121 Type 2 diabetes mellitus with diabetic nephropathy: Secondary | ICD-10-CM

## 2022-07-24 DIAGNOSIS — E871 Hypo-osmolality and hyponatremia: Secondary | ICD-10-CM | POA: Diagnosis present

## 2022-07-24 DIAGNOSIS — I5043 Acute on chronic combined systolic (congestive) and diastolic (congestive) heart failure: Secondary | ICD-10-CM | POA: Diagnosis present

## 2022-07-24 DIAGNOSIS — I130 Hypertensive heart and chronic kidney disease with heart failure and stage 1 through stage 4 chronic kidney disease, or unspecified chronic kidney disease: Secondary | ICD-10-CM | POA: Diagnosis present

## 2022-07-24 DIAGNOSIS — Z6839 Body mass index (BMI) 39.0-39.9, adult: Secondary | ICD-10-CM

## 2022-07-24 DIAGNOSIS — Z91148 Patient's other noncompliance with medication regimen for other reason: Secondary | ICD-10-CM | POA: Diagnosis not present

## 2022-07-24 DIAGNOSIS — E876 Hypokalemia: Secondary | ICD-10-CM | POA: Diagnosis present

## 2022-07-24 DIAGNOSIS — N182 Chronic kidney disease, stage 2 (mild): Secondary | ICD-10-CM | POA: Diagnosis present

## 2022-07-24 DIAGNOSIS — I428 Other cardiomyopathies: Secondary | ICD-10-CM | POA: Diagnosis not present

## 2022-07-24 DIAGNOSIS — I509 Heart failure, unspecified: Secondary | ICD-10-CM

## 2022-07-24 DIAGNOSIS — I5084 End stage heart failure: Secondary | ICD-10-CM | POA: Diagnosis present

## 2022-07-24 DIAGNOSIS — I420 Dilated cardiomyopathy: Secondary | ICD-10-CM | POA: Diagnosis present

## 2022-07-24 DIAGNOSIS — F1410 Cocaine abuse, uncomplicated: Secondary | ICD-10-CM | POA: Diagnosis not present

## 2022-07-24 DIAGNOSIS — R600 Localized edema: Secondary | ICD-10-CM

## 2022-07-24 DIAGNOSIS — E1169 Type 2 diabetes mellitus with other specified complication: Secondary | ICD-10-CM | POA: Diagnosis not present

## 2022-07-24 DIAGNOSIS — I517 Cardiomegaly: Secondary | ICD-10-CM | POA: Diagnosis not present

## 2022-07-24 DIAGNOSIS — Z91199 Patient's noncompliance with other medical treatment and regimen due to unspecified reason: Secondary | ICD-10-CM

## 2022-07-24 DIAGNOSIS — Z91018 Allergy to other foods: Secondary | ICD-10-CM

## 2022-07-24 DIAGNOSIS — Z833 Family history of diabetes mellitus: Secondary | ICD-10-CM

## 2022-07-24 DIAGNOSIS — Z79899 Other long term (current) drug therapy: Secondary | ICD-10-CM | POA: Diagnosis not present

## 2022-07-24 DIAGNOSIS — E1129 Type 2 diabetes mellitus with other diabetic kidney complication: Secondary | ICD-10-CM | POA: Diagnosis present

## 2022-07-24 DIAGNOSIS — I272 Pulmonary hypertension, unspecified: Secondary | ICD-10-CM | POA: Diagnosis present

## 2022-07-24 DIAGNOSIS — I071 Rheumatic tricuspid insufficiency: Secondary | ICD-10-CM | POA: Diagnosis present

## 2022-07-24 DIAGNOSIS — K219 Gastro-esophageal reflux disease without esophagitis: Secondary | ICD-10-CM

## 2022-07-24 DIAGNOSIS — E785 Hyperlipidemia, unspecified: Secondary | ICD-10-CM | POA: Diagnosis present

## 2022-07-24 DIAGNOSIS — Z8249 Family history of ischemic heart disease and other diseases of the circulatory system: Secondary | ICD-10-CM | POA: Diagnosis not present

## 2022-07-24 DIAGNOSIS — I5082 Biventricular heart failure: Secondary | ICD-10-CM | POA: Diagnosis present

## 2022-07-24 DIAGNOSIS — E1165 Type 2 diabetes mellitus with hyperglycemia: Secondary | ICD-10-CM | POA: Diagnosis present

## 2022-07-24 DIAGNOSIS — I3139 Other pericardial effusion (noninflammatory): Secondary | ICD-10-CM | POA: Diagnosis present

## 2022-07-24 DIAGNOSIS — E878 Other disorders of electrolyte and fluid balance, not elsewhere classified: Secondary | ICD-10-CM | POA: Diagnosis present

## 2022-07-24 DIAGNOSIS — E669 Obesity, unspecified: Secondary | ICD-10-CM | POA: Diagnosis not present

## 2022-07-24 DIAGNOSIS — E1122 Type 2 diabetes mellitus with diabetic chronic kidney disease: Secondary | ICD-10-CM | POA: Diagnosis present

## 2022-07-24 DIAGNOSIS — J810 Acute pulmonary edema: Secondary | ICD-10-CM | POA: Diagnosis present

## 2022-07-24 DIAGNOSIS — Z794 Long term (current) use of insulin: Secondary | ICD-10-CM

## 2022-07-24 DIAGNOSIS — D509 Iron deficiency anemia, unspecified: Secondary | ICD-10-CM | POA: Diagnosis present

## 2022-07-24 DIAGNOSIS — I10 Essential (primary) hypertension: Secondary | ICD-10-CM | POA: Diagnosis not present

## 2022-07-24 DIAGNOSIS — R809 Proteinuria, unspecified: Secondary | ICD-10-CM | POA: Diagnosis not present

## 2022-07-24 DIAGNOSIS — Z8616 Personal history of COVID-19: Secondary | ICD-10-CM

## 2022-07-24 DIAGNOSIS — Z87891 Personal history of nicotine dependence: Secondary | ICD-10-CM

## 2022-07-24 LAB — COMPREHENSIVE METABOLIC PANEL
ALT: 13 U/L (ref 0–44)
AST: 19 U/L (ref 15–41)
Albumin: 1.7 g/dL — ABNORMAL LOW (ref 3.5–5.0)
Alkaline Phosphatase: 144 U/L — ABNORMAL HIGH (ref 38–126)
Anion gap: 11 (ref 5–15)
BUN: 28 mg/dL — ABNORMAL HIGH (ref 6–20)
CO2: 25 mmol/L (ref 22–32)
Calcium: 6 mg/dL — CL (ref 8.9–10.3)
Chloride: 96 mmol/L — ABNORMAL LOW (ref 98–111)
Creatinine, Ser: 1.23 mg/dL — ABNORMAL HIGH (ref 0.44–1.00)
GFR, Estimated: 58 mL/min — ABNORMAL LOW (ref >60)
Glucose, Bld: 244 mg/dL — ABNORMAL HIGH (ref 70–99)
Potassium: 3.7 mmol/L (ref 3.5–5.1)
Sodium: 132 mmol/L — ABNORMAL LOW (ref 135–145)
Total Bilirubin: 0.7 mg/dL (ref 0.3–1.2)
Total Protein: 6.2 g/dL — ABNORMAL LOW (ref 6.5–8.1)

## 2022-07-24 LAB — CBC WITH DIFFERENTIAL/PLATELET
Abs Immature Granulocytes: 0.01 10*3/uL (ref 0.00–0.07)
Basophils Absolute: 0.1 10*3/uL (ref 0.0–0.1)
Basophils Relative: 2 %
Eosinophils Absolute: 0.2 10*3/uL (ref 0.0–0.5)
Eosinophils Relative: 3 %
HCT: 35.4 % — ABNORMAL LOW (ref 36.0–46.0)
Hemoglobin: 10.2 g/dL — ABNORMAL LOW (ref 12.0–15.0)
Immature Granulocytes: 0 %
Lymphocytes Relative: 15 %
Lymphs Abs: 0.9 10*3/uL (ref 0.7–4.0)
MCH: 24.3 pg — ABNORMAL LOW (ref 26.0–34.0)
MCHC: 28.8 g/dL — ABNORMAL LOW (ref 30.0–36.0)
MCV: 84.3 fL (ref 80.0–100.0)
Monocytes Absolute: 0.5 10*3/uL (ref 0.1–1.0)
Monocytes Relative: 8 %
Neutro Abs: 4.2 10*3/uL (ref 1.7–7.7)
Neutrophils Relative %: 72 %
Platelets: 395 10*3/uL (ref 150–400)
RBC: 4.2 MIL/uL (ref 3.87–5.11)
RDW: 19.3 % — ABNORMAL HIGH (ref 11.5–15.5)
WBC: 5.8 10*3/uL (ref 4.0–10.5)
nRBC: 0 % (ref 0.0–0.2)

## 2022-07-24 LAB — TROPONIN I (HIGH SENSITIVITY)
Troponin I (High Sensitivity): 13 ng/L (ref <18)
Troponin I (High Sensitivity): 14 ng/L (ref <18)

## 2022-07-24 LAB — CBG MONITORING, ED: Glucose-Capillary: 220 mg/dL — ABNORMAL HIGH (ref 70–99)

## 2022-07-24 LAB — BRAIN NATRIURETIC PEPTIDE: B Natriuretic Peptide: 1400.4 pg/mL — ABNORMAL HIGH (ref 0.0–100.0)

## 2022-07-24 MED ORDER — POTASSIUM CHLORIDE CRYS ER 20 MEQ PO TBCR: 10.0000 meq | EXTENDED_RELEASE_TABLET | Freq: Every day | ORAL | Status: DC

## 2022-07-24 MED ORDER — HYDROCOD POLI-CHLORPHE POLI ER 10-8 MG/5ML PO SUER: 5.0000 mL | Freq: Two times a day (BID) | ORAL | Status: DC | PRN

## 2022-07-24 MED ORDER — FUROSEMIDE 10 MG/ML IJ SOLN
80.0000 mg | Freq: Two times a day (BID) | INTRAMUSCULAR | Status: DC
  Administered 2022-07-25: 80 mg via INTRAVENOUS
  Filled 2022-07-24: qty 8

## 2022-07-24 MED ORDER — TRAZODONE HCL 50 MG PO TABS
25.0000 mg | ORAL_TABLET | Freq: Every evening | ORAL | Status: DC | PRN
  Administered 2022-07-26 – 2022-08-03 (×6): 25 mg via ORAL
  Filled 2022-07-24 (×6): qty 1

## 2022-07-24 MED ORDER — ACETAMINOPHEN 650 MG RE SUPP: 650.0000 mg | Freq: Four times a day (QID) | RECTAL | Status: DC | PRN

## 2022-07-24 MED ORDER — MAGNESIUM HYDROXIDE 400 MG/5ML PO SUSP: 30.0000 mL | Freq: Every day | ORAL | Status: DC | PRN

## 2022-07-24 MED ORDER — BENZONATATE 100 MG PO CAPS: 100.0000 mg | ORAL_CAPSULE | Freq: Three times a day (TID) | ORAL | Status: DC | PRN

## 2022-07-24 MED ORDER — DAPAGLIFLOZIN PROPANEDIOL 5 MG PO TABS
10.0000 mg | ORAL_TABLET | Freq: Every day | ORAL | Status: DC
  Administered 2022-07-25 – 2022-08-04 (×10): 10 mg via ORAL
  Filled 2022-07-24 (×2): qty 1
  Filled 2022-07-24 (×3): qty 2
  Filled 2022-07-24: qty 1
  Filled 2022-07-24 (×2): qty 2
  Filled 2022-07-24: qty 1
  Filled 2022-07-24: qty 2
  Filled 2022-07-24 (×2): qty 1
  Filled 2022-07-24 (×2): qty 2

## 2022-07-24 MED ORDER — ONDANSETRON HCL 4 MG/2ML IJ SOLN: 4.0000 mg | Freq: Four times a day (QID) | INTRAMUSCULAR | Status: DC | PRN

## 2022-07-24 MED ORDER — ALBUTEROL SULFATE (2.5 MG/3ML) 0.083% IN NEBU: 2.5000 mg | INHALATION_SOLUTION | Freq: Four times a day (QID) | RESPIRATORY_TRACT | Status: DC | PRN

## 2022-07-24 MED ORDER — FUROSEMIDE 10 MG/ML IJ SOLN
80.0000 mg | Freq: Once | INTRAMUSCULAR | Status: AC
  Administered 2022-07-24: 80 mg via INTRAVENOUS
  Filled 2022-07-24: qty 8

## 2022-07-24 MED ORDER — ONDANSETRON HCL 4 MG PO TABS: 4.0000 mg | ORAL_TABLET | Freq: Four times a day (QID) | ORAL | Status: DC | PRN

## 2022-07-24 MED ORDER — ALBUTEROL SULFATE HFA 108 (90 BASE) MCG/ACT IN AERS: 2.0000 | INHALATION_SPRAY | Freq: Four times a day (QID) | RESPIRATORY_TRACT | Status: DC | PRN

## 2022-07-24 MED ORDER — INSULIN GLARGINE-YFGN 100 UNIT/ML ~~LOC~~ SOLN
15.0000 [IU] | Freq: Two times a day (BID) | SUBCUTANEOUS | Status: DC
  Administered 2022-07-24 – 2022-07-29 (×9): 15 [IU] via SUBCUTANEOUS
  Filled 2022-07-24 (×11): qty 0.15

## 2022-07-24 MED ORDER — CALCIUM CARBONATE 1250 (500 CA) MG PO TABS
1.0000 | ORAL_TABLET | Freq: Once | ORAL | Status: AC
  Administered 2022-07-24: 1250 mg via ORAL
  Filled 2022-07-24: qty 1

## 2022-07-24 MED ORDER — FAMOTIDINE 20 MG PO TABS
20.0000 mg | ORAL_TABLET | Freq: Two times a day (BID) | ORAL | Status: DC
  Administered 2022-07-25 – 2022-08-04 (×20): 20 mg via ORAL
  Filled 2022-07-24 (×20): qty 1

## 2022-07-24 MED ORDER — ACETAMINOPHEN 325 MG PO TABS
650.0000 mg | ORAL_TABLET | Freq: Four times a day (QID) | ORAL | Status: DC | PRN
  Administered 2022-07-26 – 2022-08-01 (×3): 650 mg via ORAL
  Filled 2022-07-24 (×3): qty 2

## 2022-07-24 MED ORDER — FERROUS SULFATE 325 (65 FE) MG PO TABS
325.0000 mg | ORAL_TABLET | Freq: Every day | ORAL | Status: DC
  Administered 2022-07-25 – 2022-08-04 (×10): 325 mg via ORAL
  Filled 2022-07-24 (×10): qty 1

## 2022-07-24 MED ORDER — ENOXAPARIN SODIUM 60 MG/0.6ML IJ SOSY
0.5000 mg/kg | PREFILLED_SYRINGE | INTRAMUSCULAR | Status: DC
  Filled 2022-07-24 (×4): qty 0.6

## 2022-07-24 MED ORDER — ONDANSETRON 4 MG PO TBDP: 4.0000 mg | ORAL_TABLET | Freq: Three times a day (TID) | ORAL | Status: DC | PRN

## 2022-07-24 NOTE — Assessment & Plan Note (Signed)
-   We will continue H2 blocker therapy. 

## 2022-07-24 NOTE — H&P (Addendum)
[place]   PATIENT NAME: Molly Howard    MR#:  [id number]  DATE OF BIRTH:  1985/05/12  DATE OF ADMISSION:  07/24/2022  PRIMARY CARE PHYSICIAN: [name], DO   Patient is coming from: Home  REQUESTING/REFERRING PHYSICIAN: Lucillie Garfinkel, MD  CHIEF COMPLAINT:   Chief Complaint  Patient presents with   Bloated    HISTORY OF PRESENT ILLNESS:  Molly Howard is a 37 y.o. female with medical history significant for combined systolic and diastolic CHF, stage II CKD, type diabetes mellitus, hypertension and nonischemic cardiomyopathy as well as for substance abuse, who presented to the emergency room with a [place] of worsening dyspnea with associated orthopnea, dyspnea on exertion paroxysmal nocturnal dyspnea over the last 3 days.  She admitted to bilateral lower extremity edema and 15 pound weight gain over the last week.  She has been having dry cough and wheezing.  No chest pain or palpitations.  No fever or chills.  No dysuria, oliguria or hematuria or flank pain.  No nausea or vomiting or abdominal pain.  ED Course: When she came to the ER, BP was 134/100 with a heart rate of 101 and otherwise normal vital signs.  Labs revealed mild hyponatremia 132 and hypochloremia 96, BUN of 28 and creatinine 1.23 close to baseline and calcium was 6 with albumin of 1.7 and troponin 6.2.  BNP was 1400 and.  High sensitive troponin was 13 and later 14.  CBC showed anemia close to baseline.  Blood glucose was 244. EKG as reviewed by me : Pending Imaging: Two-view chest x-ray showed cardiomegaly with pulmonary edema. PAST MEDICAL HISTORY:   Past Medical History:  Diagnosis Date   Acid reflux    Chronic HFrEF (heart failure with reduced ejection fraction) ([place])    a. 10/2019 Echo: EF 35-40%, GrII DD; b. 05/2020 Echo: EF 20-25%, glob HK; c. 10/2020 Echo: EF 30-35%, glob HK. GrII DD, Mildly red RV fxn. Mod TR; d.  05/2021 cMRI: EF 42%, no LGE. Nl RV size/fxn.   CKD (chronic kidney disease),  stage II    Diabetes mellitus ([place])    H/O medication noncompliance    Hypertension    Microcytic anemia    NICM (nonischemic cardiomyopathy) ([place])    a. 10/2019 Echo: EF 35-40%; b. 10/2019 MV: No ischemia. Small apical defect-->breast attenuation; c. 05/2020 Echo: EF 20-25%; d. 10/2020 Echo: EF 30-35%, glob HK. GrII DD, Mildly red RV fxn. Mod TR; e. 05/2021 cMRI: EF 42%, no LGE. Nl RV size/fxn.   Obesity    Polysubstance abuse ([place])     PAST SURGICAL HISTORY:   Past Surgical History:  Procedure Laterality Date   CHOLECYSTECTOMY     HERNIA REPAIR      SOCIAL HISTORY:   Social History   Tobacco Use   Smoking status: Former    Packs/day: .5    Types: Cigarettes    Quit date: 2022    Years since quitting: 2.2   Smokeless tobacco: Never  Substance Use Topics   Alcohol use: Not Currently    Comment: ~ 4 shots/day on weekends only    FAMILY HISTORY:   Family History  Problem Relation Age of Onset   Heart failure Mother        a. onset in her 37s   Diabetes Mother    Hypertension Father    Diabetes Father     DRUG ALLERGIES:   Allergies  Allergen Reactions   No [employer] Food Allergies  Rash and Other (See Comments)    Lemons    REVIEW OF SYSTEMS:   ROS As per history of present illness. All pertinent systems were reviewed above. Constitutional, HEENT, cardiovascular, respiratory, GI, GU, musculoskeletal, neuro, psychiatric, endocrine, integumentary and hematologic systems were reviewed and are otherwise negative/unremarkable except for positive findings mentioned above in the HPI.   MEDICATIONS AT HOME:   Prior to Admission medications   Medication Sig Start Date End Date Taking? Authorizing Provider  spironolactone (ALDACTONE) 25 MG tablet Take 0.5 tablets (12.5 mg total) by mouth daily. Patient not taking: Reported on 07/24/2022 05/28/22   Alisa Graff, FNP  torsemide 60 MG TABS Take 60 mg by mouth 2 (two) times daily. Patient taking differently: Take 40 mg  by mouth 2 (two) times daily. 02/16/22 07/24/22 Yes Nolberto Hanlon, MD  albuterol (VENTOLIN HFA) 108 (90 Base) MCG/ACT inhaler Inhale 2 puffs into the lungs every 6 (six) hours as needed for wheezing or shortness of breath. Patient not taking: Reported on 07/24/2022 06/08/22   Menshew, Dannielle Karvonen, PA-C  atorvastatin (LIPITOR) 20 MG tablet TAKE 1 TABLET BY MOUTH DAILY Patient not taking: Reported on 07/24/2022 12/14/21   Alisa Graff, FNP  benzonatate (TESSALON PERLES) 100 MG capsule Take 1-2 tabs TID prn cough Patient not taking: Reported on 07/24/2022 06/08/22   Menshew, Dannielle Karvonen, PA-C  carvedilol (COREG) 6.25 MG tablet Take 1 tablet (6.25 mg total) by mouth 2 (two) times daily with a meal. Patient not taking: Reported on 07/24/2022 12/11/20   Alisa Graff, FNP  chlorpheniramine-HYDROcodone (TUSSIONEX) 10-8 MG/5ML Take 5 mLs by mouth every 12 (twelve) hours as needed for cough. Patient not taking: Reported on 07/24/2022 06/08/22   Menshew, Dannielle Karvonen, PA-C  dapagliflozin propanediol (FARXIGA) 10 MG TABS tablet Take 1 tablet (10 mg total) by mouth once daily. Patient not taking: Reported on 07/24/2022 11/05/21   Alisa Graff, FNP  famotidine (PEPCID) 20 MG tablet Take 1 tablet (20 mg total) by mouth 2 (two) times daily. Patient not taking: Reported on 07/24/2022 06/12/21   [name], DO  ferrous sulfate 325 (65 FE) MG tablet Take 1 tablet (325 mg total) by mouth daily with breakfast. 02/17/22 05/27/22  Nolberto Hanlon, MD  HYDROcodone-acetaminophen (NORCO/VICODIN) 5-325 MG tablet Take 1-2 tablets by mouth every 6 (six) hours as needed for moderate pain. Patient not taking: Reported on 07/24/2022 05/27/22   Delman Kitten, MD  insulin glargine (LANTUS SOLOSTAR) 100 UNIT/ML Solostar Pen Take 10 units in the morning and 15 in the evenings. Patient not taking: Reported on 07/24/2022 02/04/22   [name], DO  norethindrone (AYGESTIN) 5 MG tablet Take 1 tablet (5 mg total) by mouth  daily. Patient not taking: Reported on 07/24/2022 [id number]   Copland, Elmo Putt B, PA-C  ondansetron (ZOFRAN-ODT) 4 MG disintegrating tablet Take 1 tablet (4 mg total) by mouth every 8 (eight) hours as needed for nausea or vomiting. Patient not taking: Reported on 07/24/2022 06/08/22   Menshew, Dannielle Karvonen, PA-C  potassium chloride SA (KLOR-CON M) 20 MEQ tablet Take 0.5 tablets (10 mEq total) by mouth daily. Patient not taking: Reported on 07/24/2022 04/29/21   Alisa Graff, FNP  sacubitril-valsartan (ENTRESTO) 97-103 MG Take 1 tablet by mouth 2 (two) times daily. Patient not taking: Reported on 07/24/2022 07/02/21   [name], MD  Potassium Chloride 40 MEQ/15ML (20%) SOLN Take 40 mEq by mouth 2 (two) times daily. 07/04/20 10/18/20  [name], MD  VITAL SIGNS:  Blood pressure 132/88, pulse (!) 108, temperature 98.5 F (36.9 C), temperature source Oral, resp. rate 16, height 5\' 7"  (1.702 m), weight 113.4 kg, last menstrual period 07/19/2022, SpO2 100 %.  PHYSICAL EXAMINATION:  Physical Exam  GENERAL:  37 y.o.-year-old African-American female patient lying in the bed with mild conversational dyspnea.   EYES: Pupils equal, round, reactive to light and accommodation. No scleral icterus. Extraocular muscles intact.  HEENT: Head atraumatic, normocephalic. Oropharynx and nasopharynx clear.  NECK:  Supple, no jugular venous distention. No thyroid enlargement, no tenderness.  LUNGS: Diminished bibasal breath sounds with  bibasilar rales.  No use of accessory muscles of respiration.  CARDIOVASCULAR: Regular rate and rhythm, S1, S2 normal. No murmurs, rubs, or gallops.  ABDOMEN: Soft, nondistended, nontender. Bowel sounds present. No organomegaly or mass.  EXTREMITIES: 2+ bilateral lower extremity pitting edema with no cyanosis or clubbing.  NEUROLOGIC: Cranial nerves II through XII are intact. Muscle strength 5/5 in all extremities. Sensation intact. Gait not checked.  PSYCHIATRIC: The  patient is alert and oriented x 3.  Normal affect and good eye contact. SKIN: No obvious rash, lesion, or ulcer.   LABORATORY PANEL:   CBC Recent Labs  Lab 07/25/22 0510  WBC 5.5  HGB 9.4*  HCT 32.9*  PLT 393   ------------------------------------------------------------------------------------------------------------------  Chemistries  Recent Labs  Lab 07/24/22 1619 07/25/22 0510  NA 132* 132*  K 3.7 3.4*  CL 96* 96*  CO2 25 26  GLUCOSE 244* 221*  BUN 28* 28*  CREATININE 1.23* 1.09*  CALCIUM 6.0* 6.0*  AST 19  --   ALT 13  --   ALKPHOS 144*  --   BILITOT 0.7  --    ------------------------------------------------------------------------------------------------------------------  Cardiac Enzymes No results for input(s): "TROPONINI" in the last 168 hours. ------------------------------------------------------------------------------------------------------------------  RADIOLOGY:  DG Chest 2 View  Result Date: 07/24/2022 CLINICAL DATA:  CHF EXAM: CHEST - 2 VIEW COMPARISON:  Chest x-ray 02/09/2022 FINDINGS: Enlarged cardiac silhouette. No pericardial effusion identified on lateral view. Otherwise the heart and mediastinal contours are within normal limits. No focal consolidation. Left mid lung zone linear atelectasis versus scarring. Interval increased interstitial markings. No pleural effusion. No pneumothorax. No acute osseous abnormality. IMPRESSION: Cardiomegaly with pulmonary edema. Electronically Signed   By: Iven Finn M.D.   On: 07/24/2022 17:04      IMPRESSION AND PLAN:  Assessment and Plan: * Acute on chronic combined systolic and diastolic ACC/AHA stage C congestive heart failure ([place])  -The patient will be admitted to a cardiac telemetry bed. - We will continue diuresis with IV Lasix. - We Will follow serial troponins. - We will follow I's and O's and daily weights. - We will continue Entresto and Aldactone as well as [place] and Coreg. -  Cardiology consult be obtained. - I notified Dr. Rockey Situ about the patient.   Type II diabetes mellitus with renal manifestations ([place]) - The patient will be placed on supplemental coverage with NovoLog and continue basal coverage.  Hypocalcemia - Corrected calcium 7.4. - We will replace calcium and follow level.  Dyslipidemia - We will continue statin therapy.  GERD without esophagitis - We will continue H2 blocker therapy.    DVT prophylaxis: Lovenox.  Advanced Care Planning:  Code Status: full code.  Family Communication:  The plan of care was discussed in details with the patient (and family). I answered all questions. The patient agreed to proceed with the above mentioned plan. Further management will depend upon hospital course. Disposition  Plan: Back to previous home environment Consults called: none.  All the records are reviewed and case discussed with ED provider.  Status is: Inpatient   At the time of the admission, it appears that the appropriate admission status for this patient is inpatient.  This is judged to be reasonable and necessary in order to provide the required intensity of service to ensure the patient's safety given the presenting symptoms, physical exam findings and initial radiographic and laboratory data in the context of comorbid conditions.  The patient requires inpatient status due to high intensity of service, high risk of further deterioration and high frequency of surveillance required.  I certify that at the time of admission, it is my clinical judgment that the patient will require inpatient hospital care extending more than 2 midnights.                            Dispo: The patient is from: Home              Anticipated d/c is to: Home              Patient currently is not medically stable to d/c.              Difficult to place patient: No  Christel Mormon M.D on 07/25/2022 at 5:34 AM  [facility]   From 7 PM-7 AM, contact  night-coverage [email or web address]  CC: Primary care physician; [name], DO

## 2022-07-24 NOTE — Assessment & Plan Note (Signed)
-  Continue Semglee 15 units twice daily -Continue Farxiga -SSI

## 2022-07-24 NOTE — Progress Notes (Signed)
PHARMACIST - PHYSICIAN COMMUNICATION  CONCERNING:  Enoxaparin (Lovenox) for DVT Prophylaxis    RECOMMENDATION: Patient was prescribed enoxaprin 40mg  q24 hours for VTE prophylaxis.   Filed Weights   07/24/22 1612  Weight: 113.4 kg (250 lb)    Body mass index is 39.16 kg/m.  Estimated Creatinine Clearance: 80.6 mL/min (A) (by C-G formula based on SCr of 1.23 mg/dL (H)).   Based on [place] patient is candidate for enoxaparin 0.5mg /kg TBW SQ every 24 hours based on BMI being >30.  DESCRIPTION: Pharmacy has adjusted enoxaparin dose per [facility] policy.  Patient is now receiving enoxaparin 0.5 mg/kg every 24 hours   Renda Rolls, PharmD, [facility] 07/24/2022 11:04 PM

## 2022-07-24 NOTE — ED Triage Notes (Addendum)
Pt c/o ascites x1 week due to CHF. Abdomen is round and firm, 1+ pitting edema in lower extremities. Pt AOX4, eating chick fila from lobby. Pt denies CP, SHOB, dizziness.

## 2022-07-24 NOTE — ED Provider Notes (Signed)
[facility] Provider Note    Event Date/Time   First MD Initiated Contact with Patient 07/24/22 1705     (approximate)   History   Bloated   HPI  Molly Howard is a 37 y.o. female   Past medical history of CHF with reduced ejection fraction secondary to nonischemic cardiomyopathy, diabetes, hypertension, hyperlipidemia, CKD, chronic anemia, cocaine use who presents to the emergency department with bloating sensation and swelling throughout her body.  This is been ongoing for the last several weeks progressively worsening and associated with some exertional dyspnea and orthopnea.  She has no chest pain.  She has no respiratory infectious symptoms.  She states that she has been compliant with her torsemide and has been taking 40 mg twice daily.  She states 10 to 15 pound weight gain over the last 2 weeks.  She has been trying to avoid high salt diet but has been difficult, has [employer] in hand today.  Her last cocaine use was last week.  She feels similar to when she was hospitalized last year for fluid overload and required diuresis in the hospital.  She has no other acute medical complaints.  External Medical Documents Reviewed: Discharge summary from October 2023 with heart failure exacerbation requiring IV Lasix for diuresis and at that time was discharged with instructions to take 60 mg twice daily of torsemide      Physical Exam   Triage Vital Signs: ED Triage Vitals  Enc Vitals Group     BP 07/24/22 1611 (!) 149/109     Pulse Rate 07/24/22 1611 (!) 106     Resp 07/24/22 1611 18     Temp 07/24/22 1611 98.5 F (36.9 C)     Temp Source 07/24/22 1611 Oral     SpO2 07/24/22 1611 97 %     Weight 07/24/22 1612 250 lb (113.4 kg)     Height 07/24/22 1612 5\' 7"  (1.702 m)     Head Circumference --      Peak Flow --      Pain Score 07/24/22 1612 0     Pain Loc --      Pain Edu? --      Excl. in [place]? --     Most recent vital signs: Vitals:    07/24/22 2030 07/24/22 2100  BP: (!) 145/109 (!) 138/102  Pulse: (!) 102 98  Resp: 19   Temp:    SpO2: 100% 100%    General: Awake, no distress.  CV:  Good peripheral perfusion. Resp:  Normal effort.  Abd:  No distention.  Other:  She has rales to the mid lungs bilaterally no hypoxemia no respiratory distress breathing comfortably on room air with heart rate 100 and hypertensive 140s over 100.  Afebrile.  Abdomen is distended and she has pitting edema to bilateral lower extremities.   ED Results / Procedures / Treatments   Labs (all labs ordered are listed, but only abnormal results are displayed) Labs Reviewed  BRAIN NATRIURETIC PEPTIDE - Abnormal; Notable for the following components:      Result Value   B Natriuretic Peptide 1,400.4 (*)    All other components within normal limits  CBC WITH DIFFERENTIAL/PLATELET - Abnormal; Notable for the following components:   Hemoglobin 10.2 (*)    HCT 35.4 (*)    MCH 24.3 (*)    MCHC 28.8 (*)    RDW 19.3 (*)    All other components within normal limits  COMPREHENSIVE METABOLIC  PANEL - Abnormal; Notable for the following components:   Sodium 132 (*)    Chloride 96 (*)    Glucose, Bld 244 (*)    BUN 28 (*)    Creatinine, Ser 1.23 (*)    Calcium 6.0 (*)    Total Protein 6.2 (*)    Albumin 1.7 (*)    Alkaline Phosphatase 144 (*)    GFR, Estimated 58 (*)    All other components within normal limits  CALCIUM, IONIZED  TROPONIN I (HIGH SENSITIVITY)  TROPONIN I (HIGH SENSITIVITY)     I ordered and reviewed the above labs they are notable for BNP elevated at 1400  EKG  ED ECG REPORT I, Lucillie Garfinkel, the attending physician, personally viewed and interpreted this ECG.   Date: 07/24/2022  EKG Time: 1738  Rate: 101  Rhythm: sinus tachy  Axis: nl  Intervals:long Qtc  ST&T Change: no stemi    RADIOLOGY I independently reviewed and interpreted chest x-ray and see bilateral opacifications concerning for pulmonary  edema   PROCEDURES:  Critical Care performed: No  Procedures   MEDICATIONS ORDERED IN ED: Medications  furosemide (LASIX) injection 80 mg (80 mg Intravenous Given 07/24/22 1742)  calcium carbonate (OS-CAL - dosed in mg of elemental calcium) tablet 1,250 mg (1,250 mg Oral Given 07/24/22 2130)    External physician / consultants:  I spoke with hospitalist for admission & regarding care plan for this patient.   IMPRESSION / MDM / ASSESSMENT AND PLAN / ED COURSE  I reviewed the triage vital signs and the nursing notes.                                Patient's presentation is most consistent with acute presentation with potential threat to life or bodily function.  Differential diagnosis includes, but is not limited to, CHF exacerbation, pulmonary edema, anasarca, heart failure exacerbation, ACS   The patient is on the cardiac monitor to evaluate for evidence of arrhythmia and/or significant heart rate changes.  MDM: This is a patient with pulmonary edema history of CHF consistent with CHF exacerbation both in the setting of dietary changes and underdosing of her torsemide for which she was advised to take 60 mg twice daily and has been taking 40 mg.  Is progressively worsening over several weeks with a 10 to 15 pound weight gain appears fluid overloaded on my exam.  Fortunately she is in no respiratory distress and has no hypoxemia.  Had fleeting sharp chest pain earlier today, none currently; low suspicion ACS will cehck EKG/trops  She required hospitalization in the fall 2023 for diuresis, however the patient would like to trial a course of diuresis and if symptomatically improved in the emergency department continues to be stable would like to be discharged with cardiology follow-up.  Will start diuresis now and reassess for disposition.   -- She has had a significant amount of urine output however remains very symptomatic exertional dyspnea while ambulating to the commode.   Inaccurate urine output measurements because the patient was urinating into the toilet and removing the collection basin.  Will admit for ongoing diuresis in the setting of CHF exacerbation.  --        FINAL CLINICAL IMPRESSION(S) / ED DIAGNOSES   Final diagnoses:  Acute on chronic congestive heart failure, unspecified heart failure type ([place])     Rx / DC Orders   ED Discharge Orders  None        Note:  This document was prepared using Dragon voice recognition software and may include unintentional dictation errors.    Lucillie Garfinkel, MD 07/24/22 2153

## 2022-07-24 NOTE — ED Notes (Addendum)
Pt ambulates to the bathroom. Hat placed in the toilet prior to pt using the bathroom. Pt said she had a bowel movement, so she poured the urine out. Pt educated that, it's important to be able to document the amount of urine that she is putting out, and that there is no concern of recreational drug use. The documentation of urine output is purely for output measuring purposes. Pt voiced understanding.

## 2022-07-24 NOTE — Assessment & Plan Note (Addendum)
Patient admitted with dyspnea, orthopnea, 25 pound weight gain and significant edema involving lower extremities and flanks.  Most likely secondary to being noncompliant and continue to use cocaine. Echocardiogram done in 05/23 with a EF of 25 to 30% and grade 2 diastolic dysfunction.  Cardiology is on board. -Cardiology holding carvedilol and Entresto. -Increasing the dose of Lasix to 80 mg every 8 hourly -Added metolazone -Continue Farxiga -Strict intake and output -Daily weight and BMP -Repeat echocardiogram

## 2022-07-24 NOTE — Assessment & Plan Note (Signed)
-   We will continue statin therapy. 

## 2022-07-25 ENCOUNTER — Encounter: Payer: Self-pay | Admitting: Family Medicine

## 2022-07-25 ENCOUNTER — Inpatient Hospital Stay (HOSPITAL_COMMUNITY)
Admit: 2022-07-25 | Discharge: 2022-07-25 | Disposition: A | Discharge status: Home / Self Care | Payer: Medicaid Other | Attending: Cardiovascular Disease | Admitting: Cardiovascular Disease

## 2022-07-25 DIAGNOSIS — E785 Hyperlipidemia, unspecified: Secondary | ICD-10-CM | POA: Diagnosis not present

## 2022-07-25 DIAGNOSIS — N182 Chronic kidney disease, stage 2 (mild): Secondary | ICD-10-CM

## 2022-07-25 DIAGNOSIS — F1410 Cocaine abuse, uncomplicated: Secondary | ICD-10-CM

## 2022-07-25 DIAGNOSIS — E669 Obesity, unspecified: Secondary | ICD-10-CM

## 2022-07-25 DIAGNOSIS — E1129 Type 2 diabetes mellitus with other diabetic kidney complication: Secondary | ICD-10-CM | POA: Diagnosis not present

## 2022-07-25 DIAGNOSIS — R600 Localized edema: Secondary | ICD-10-CM

## 2022-07-25 DIAGNOSIS — I517 Cardiomegaly: Secondary | ICD-10-CM | POA: Diagnosis not present

## 2022-07-25 DIAGNOSIS — R809 Proteinuria, unspecified: Secondary | ICD-10-CM

## 2022-07-25 DIAGNOSIS — I5043 Acute on chronic combined systolic (congestive) and diastolic (congestive) heart failure: Secondary | ICD-10-CM | POA: Diagnosis not present

## 2022-07-25 LAB — URINE DRUG SCREEN, QUALITATIVE (ARMC ONLY)
Amphetamines, Ur Screen: NOT DETECTED
Barbiturates, Ur Screen: NOT DETECTED
Benzodiazepine, Ur Scrn: NOT DETECTED
Cannabinoid 50 Ng, Ur ~~LOC~~: NOT DETECTED
Cocaine Metabolite,Ur ~~LOC~~: POSITIVE — AB
MDMA (Ecstasy)Ur Screen: NOT DETECTED
Methadone Scn, Ur: NOT DETECTED
Opiate, Ur Screen: NOT DETECTED
Phencyclidine (PCP) Ur S: NOT DETECTED
Tricyclic, Ur Screen: NOT DETECTED

## 2022-07-25 LAB — BASIC METABOLIC PANEL
Anion gap: 10 (ref 5–15)
BUN: 28 mg/dL — ABNORMAL HIGH (ref 6–20)
CO2: 26 mmol/L (ref 22–32)
Calcium: 6 mg/dL — CL (ref 8.9–10.3)
Chloride: 96 mmol/L — ABNORMAL LOW (ref 98–111)
Creatinine, Ser: 1.09 mg/dL — ABNORMAL HIGH (ref 0.44–1.00)
GFR, Estimated: >60 mL/min (ref >60)
Glucose, Bld: 221 mg/dL — ABNORMAL HIGH (ref 70–99)
Potassium: 3.4 mmol/L — ABNORMAL LOW (ref 3.5–5.1)
Sodium: 132 mmol/L — ABNORMAL LOW (ref 135–145)

## 2022-07-25 LAB — ECHOCARDIOGRAM COMPLETE
AR max vel: 2.39 cm2
AV Peak grad: 8.4 mmHg
Ao pk vel: 1.45 m/s
Calc EF: 21.6 %
Height: 67 in
S' Lateral: 4.8 cm
Single Plane A2C EF: 14.1 %
Single Plane A4C EF: 27.2 %
Weight: 4000 oz

## 2022-07-25 LAB — GLUCOSE, CAPILLARY
Glucose-Capillary: 150 mg/dL — ABNORMAL HIGH (ref 70–99)
Glucose-Capillary: 153 mg/dL — ABNORMAL HIGH (ref 70–99)

## 2022-07-25 LAB — CBG MONITORING, ED
Glucose-Capillary: 218 mg/dL — ABNORMAL HIGH (ref 70–99)
Glucose-Capillary: 227 mg/dL — ABNORMAL HIGH (ref 70–99)

## 2022-07-25 LAB — CBC
HCT: 32.9 % — ABNORMAL LOW (ref 36.0–46.0)
Hemoglobin: 9.4 g/dL — ABNORMAL LOW (ref 12.0–15.0)
MCH: 24 pg — ABNORMAL LOW (ref 26.0–34.0)
MCHC: 28.6 g/dL — ABNORMAL LOW (ref 30.0–36.0)
MCV: 84.1 fL (ref 80.0–100.0)
Platelets: 393 10*3/uL (ref 150–400)
RBC: 3.91 MIL/uL (ref 3.87–5.11)
RDW: 19.2 % — ABNORMAL HIGH (ref 11.5–15.5)
WBC: 5.5 10*3/uL (ref 4.0–10.5)
nRBC: 0 % (ref 0.0–0.2)

## 2022-07-25 MED ORDER — POTASSIUM CHLORIDE CRYS ER 20 MEQ PO TBCR
20.0000 meq | EXTENDED_RELEASE_TABLET | Freq: Every day | ORAL | Status: DC
  Administered 2022-07-25: 20 meq via ORAL
  Filled 2022-07-25: qty 1

## 2022-07-25 MED ORDER — POTASSIUM CHLORIDE CRYS ER 20 MEQ PO TBCR: 40.0000 meq | EXTENDED_RELEASE_TABLET | Freq: Every day | ORAL | Status: DC

## 2022-07-25 MED ORDER — CALCIUM GLUCONATE-NACL 1-0.675 GM/50ML-% IV SOLN
1.0000 g | Freq: Once | INTRAVENOUS | Status: AC
  Administered 2022-07-25: 1000 mg via INTRAVENOUS
  Filled 2022-07-25: qty 50

## 2022-07-25 MED ORDER — FUROSEMIDE 10 MG/ML IJ SOLN
80.0000 mg | Freq: Three times a day (TID) | INTRAMUSCULAR | Status: DC
  Administered 2022-07-25 – 2022-07-26 (×3): 80 mg via INTRAVENOUS
  Filled 2022-07-25 (×3): qty 8

## 2022-07-25 MED ORDER — CALCIUM CARBONATE 1250 (500 CA) MG PO TABS
1.0000 | ORAL_TABLET | Freq: Three times a day (TID) | ORAL | Status: DC
  Administered 2022-07-25 – 2022-08-04 (×30): 1250 mg via ORAL
  Filled 2022-07-25 (×34): qty 1

## 2022-07-25 MED ORDER — METOLAZONE 2.5 MG PO TABS
2.5000 mg | ORAL_TABLET | Freq: Once | ORAL | Status: AC
  Administered 2022-07-25: 2.5 mg via ORAL
  Filled 2022-07-25: qty 1

## 2022-07-25 MED ORDER — INSULIN ASPART 100 UNIT/ML IJ SOLN
0.0000 [IU] | Freq: Every day | INTRAMUSCULAR | Status: DC
  Administered 2022-07-27 – 2022-07-31 (×4): 3 [IU] via SUBCUTANEOUS
  Administered 2022-08-01: 4 [IU] via SUBCUTANEOUS
  Administered 2022-08-02 – 2022-08-03 (×2): 5 [IU] via SUBCUTANEOUS
  Filled 2022-07-25 (×7): qty 1

## 2022-07-25 MED ORDER — SACUBITRIL-VALSARTAN 24-26 MG PO TABS
1.0000 | ORAL_TABLET | Freq: Two times a day (BID) | ORAL | Status: DC
  Administered 2022-07-25 – 2022-07-29 (×7): 1 via ORAL
  Filled 2022-07-25 (×8): qty 1

## 2022-07-25 MED ORDER — INSULIN ASPART 100 UNIT/ML IJ SOLN
0.0000 [IU] | Freq: Three times a day (TID) | INTRAMUSCULAR | Status: DC
  Administered 2022-07-25: 1 [IU] via SUBCUTANEOUS
  Administered 2022-07-26 – 2022-07-27 (×5): 2 [IU] via SUBCUTANEOUS
  Administered 2022-07-28 – 2022-07-29 (×2): 3 [IU] via SUBCUTANEOUS
  Administered 2022-07-29 (×2): 2 [IU] via SUBCUTANEOUS
  Administered 2022-07-30: 1 [IU] via SUBCUTANEOUS
  Administered 2022-07-30: 3 [IU] via SUBCUTANEOUS
  Administered 2022-07-30: 2 [IU] via SUBCUTANEOUS
  Administered 2022-07-31: 3 [IU] via SUBCUTANEOUS
  Administered 2022-07-31: 5 [IU] via SUBCUTANEOUS
  Administered 2022-08-01: 7 [IU] via SUBCUTANEOUS
  Administered 2022-08-01: 3 [IU] via SUBCUTANEOUS
  Administered 2022-08-01: 1 [IU] via SUBCUTANEOUS
  Administered 2022-08-02 (×2): 5 [IU] via SUBCUTANEOUS
  Administered 2022-08-02: 7 [IU] via SUBCUTANEOUS
  Administered 2022-08-03 (×2): 5 [IU] via SUBCUTANEOUS
  Administered 2022-08-03: 1 [IU] via SUBCUTANEOUS
  Administered 2022-08-04: 2 [IU] via SUBCUTANEOUS
  Filled 2022-07-25 (×27): qty 1

## 2022-07-25 MED ORDER — POTASSIUM CHLORIDE CRYS ER 20 MEQ PO TBCR
40.0000 meq | EXTENDED_RELEASE_TABLET | Freq: Two times a day (BID) | ORAL | Status: DC
  Administered 2022-07-25 – 2022-07-27 (×4): 40 meq via ORAL
  Filled 2022-07-25 (×4): qty 2

## 2022-07-25 MED ORDER — POTASSIUM CHLORIDE 20 MEQ PO PACK
40.0000 meq | PACK | Freq: Once | ORAL | Status: AC
  Administered 2022-07-25: 40 meq via ORAL
  Filled 2022-07-25: qty 2

## 2022-07-25 NOTE — Assessment & Plan Note (Signed)
UDS still positive for cocaine. Counseling was provided. -This will increase mortality with her cardiac disease

## 2022-07-25 NOTE — Assessment & Plan Note (Signed)
Estimated body mass index is 39.16 kg/m as calculated from the following:   Height as of this encounter: 5\' 7"  (1.702 m).   Weight as of this encounter: [id number] kg.   -Complicates overall prognosis

## 2022-07-25 NOTE — ED Notes (Signed)
Advised nurse that patient has ready bed 

## 2022-07-25 NOTE — Assessment & Plan Note (Signed)
-   Corrected calcium 7.4. - We will replace calcium and follow level.

## 2022-07-25 NOTE — Hospital Course (Addendum)
Taken from H&P.  Molly Howard is a 36 y.o. female with medical history significant for combined systolic and diastolic CHF, stage II CKD, type diabetes mellitus, hypertension and nonischemic cardiomyopathy as well as for substance abuse, who presented to the emergency room with  worsening dyspnea with associated orthopnea, dyspnea on exertion paroxysmal nocturnal dyspnea over the last 3 days.  She admitted to bilateral lower extremity edema and 15 pound weight gain over the last week.  She has been having dry cough and wheezing.  No chest pain or palpitations.  No fever or chills.   ED course.  Vitals within normal range. Labs revealed mild hyponatremia 132 and hypochloremia 96, BUN of 28 and creatinine 1.23 close to baseline and calcium was 6 with albumin of 1.7,  BNP was 1400 and  High sensitive troponin was 13 and later 14.  CBC showed anemia close to baseline.  Blood glucose was 244. EKG:  Ordered but not done yet Imaging: Two-view chest x-ray showed cardiomegaly with pulmonary edema.  Patient admitted for IV diuresis.  3/24: Vital stable, labs with pseudohyponatremia secondary to hyperglycemia, potassium at 3.4, current pain improved to 1.09.  Hemoglobin at 9.4, CBG elevated at 218.  Patient has about 25 pound weight gain and significant edema involving lower extremity and flank.  Cardiology added metolazone.  Holding home Entresto and carvedilol due to borderline blood pressure.  Concern of being noncompliant.  UDS positive for cocaine.  3/25; hemodynamically stable, blood pressure started trending up so home Entresto was restarted.  UOP of 2 L, but weight apparently increased.  Most likely some error.  Renal function stable so we will continue diuresis.  3/26: Vital stable.  Slight worsening of creatinine as compared to yesterday but remained within her baseline.  Will continue diuresis after discussing with cardiology.  She might be going for right heart catheterization tomorrow for better  evaluation of her volume status. Weight and UOP recorded seems inaccurate.

## 2022-07-25 NOTE — Assessment & Plan Note (Signed)
Creatinine currently at baseline. -Monitor renal function while she is being diuresed

## 2022-07-25 NOTE — Progress Notes (Signed)
Progress Note   Patient: Molly Howard [id number] DOB: [birth year] DOA: 07/24/2022     1 DOS: the patient was seen and examined on 07/25/2022   Brief hospital course: Taken from H&P.  Carlon GENISES SCADUTO is a 37 y.o. female with medical history significant for combined systolic and diastolic CHF, stage II CKD, type diabetes mellitus, hypertension and nonischemic cardiomyopathy as well as for substance abuse, who presented to the emergency room with  worsening dyspnea with associated orthopnea, dyspnea on exertion paroxysmal nocturnal dyspnea over the last 3 days.  She admitted to bilateral lower extremity edema and 15 pound weight gain over the last week.  She has been having dry cough and wheezing.  No chest pain or palpitations.  No fever or chills.   ED course.  Vitals within normal range. Labs revealed mild hyponatremia 132 and hypochloremia 96, BUN of 28 and creatinine 1.23 close to baseline and calcium was 6 with albumin of 1.7,  BNP was 1400 and  High sensitive troponin was 13 and later 14.  CBC showed anemia close to baseline.  Blood glucose was 244. EKG:  Ordered but not done yet Imaging: Two-view chest x-ray showed cardiomegaly with pulmonary edema.  Patient admitted for IV diuresis.  3/24: Vital stable, labs with pseudohyponatremia secondary to hyperglycemia, potassium at 3.4, current pain improved to 1.09.  Hemoglobin at 9.4, CBG elevated at 218.  Patient has about 25 pound weight gain and significant edema involving lower extremity and flank.  Cardiology added metolazone.  Holding home Entresto and carvedilol due to borderline blood pressure.  Concern of being noncompliant.  UDS positive for cocaine.   Assessment and Plan: * Acute on chronic combined systolic and diastolic ACC/AHA stage C congestive heart failure [facility]) Patient admitted with dyspnea, orthopnea, 25 pound weight gain and significant edema involving lower extremities and flanks.  Most likely secondary to being noncompliant  and continue to use cocaine. Echocardiogram done in 05/23 with a EF of 25 to 30% and grade 2 diastolic dysfunction.  Cardiology is on board. -Cardiology holding carvedilol and Entresto. -Increasing the dose of Lasix to 80 mg every 8 hourly -Added metolazone -Continue Farxiga -Strict intake and output -Daily weight and BMP -Repeat echocardiogram  Type II diabetes mellitus with renal manifestations ([facility]) -Continue Semglee 15 units twice daily -Continue Farxiga -SSI  Hypocalcemia - Corrected calcium 7.4. - We will replace calcium and follow level.  Dyslipidemia - We will continue statin therapy.  Chronic kidney disease (CKD), stage II (mild) Creatinine currently at baseline. -Monitor renal function while she is being diuresed  GERD without esophagitis - We will continue H2 blocker therapy.  Cocaine abuse ([place]) UDS still positive for cocaine. Counseling was provided. -This will increase mortality with her cardiac disease  Obesity (BMI 30-39.9) Estimated body mass index is 39.16 kg/m as calculated from the following:   Height as of this encounter: 5\' 7"  (1.702 m).   Weight as of this encounter: [id number] kg.   -Complicates overall prognosis    Subjective: Patient was feeling much improved when seen this morning.  She wants to go home.  Physical Exam: Vitals:   07/25/22 0915 07/25/22 0930 07/25/22 1000 07/25/22 1115  BP:  (!) 124/107 99/63 (!) 118/48  Pulse:  100 (!) 103 98  Resp: 20   (!) 24  Temp:      TempSrc:      SpO2:  97% 99% 100%  Weight:      Height:  General.  Obese lady, in no acute distress. Pulmonary.  Lungs clear bilaterally, normal respiratory effort. CV.  Regular rate and rhythm, no JVD, rub or murmur. Abdomen.  Soft, nontender, nondistended, BS positive. CNS.  Alert and oriented .  No focal neurologic deficit. Extremities.  2+ LE edema, no cyanosis, pulses intact and symmetrical. Psychiatry.  Judgment and insight appears normal.   Data  Reviewed: Prior data reviewed  Family Communication: Discussed with patient  Disposition: Status is: Inpatient Remains inpatient appropriate because: Severity of illness  Planned Discharge Destination: Home   Time spent: 45 minutes  This record has been created using Systems analyst. Errors have been sought and corrected,but may not always be located. Such creation errors do not reflect on the standard of care.   Author: Lorella Nimrod, MD 07/25/2022 12:41 PM  For on call review [email or web address].

## 2022-07-25 NOTE — Consult Note (Signed)
Cardiology Consult    Patient ID: MAGABY BEEGLE MRN: [id number], DOB/AGE: 09-12-1985   Admit date: 07/24/2022 Date of Consult: 07/25/2022  Primary Physician: [name], DO Primary Cardiologist: [name], MD Requesting Provider: [name]. Molly Chew, MD  Patient Profile    Molly Howard is a 37 y.o. female with a history of HFrEF, nonischemic cardiomyopathy, hypertension, type 2 diabetes mellitus, stage II chronic kidney disease, GERD, microcytic anemia, obesity, medication nonadherence, and polysubstance abuse, who is being seen today for the evaluation of acute on chronic HFrEF at the request of Dr. Reesa Howard.  Past Medical History   Past Medical History:  Diagnosis Date   Acid reflux    Chronic HFrEF (heart failure with reduced ejection fraction) ([place])    a. 10/2019 Echo: EF 35-40%, GrII DD; b. 05/2020 Echo: EF 20-25%, glob HK; c. 10/2020 Echo: EF 30-35%, glob HK. GrII DD, Mildly red RV fxn. Mod TR; d.  05/2021 cMRI: EF 42%, no LGE. Nl RV size/fxn.   CKD (chronic kidney disease), stage II    Diabetes mellitus ([place])    H/O medication noncompliance    Hypertension    Microcytic anemia    NICM (nonischemic cardiomyopathy) ([place])    a. 10/2019 Echo: EF 35-40%; b. 10/2019 MV: No ischemia. Small apical defect-->breast attenuation; c. 05/2020 Echo: EF 20-25%; d. 10/2020 Echo: EF 30-35%, glob HK. GrII DD, Mildly red RV fxn. Mod TR; e. 05/2021 cMRI: EF 42%, no LGE. Nl RV size/fxn.   Obesity    Polysubstance abuse ([place])     Past Surgical History:  Procedure Laterality Date   CHOLECYSTECTOMY     HERNIA REPAIR       Allergies  Allergies  Allergen Reactions   No [employer] Food Allergies Rash and Other (See Comments)    Lemons    History of Present Illness    37 y/o ? w/ a h/o HFrEF, nonischemic cardiomyopathy, hypertension, diabetes, stage II chronic kidney disease, GERD, microcytic anemia, obesity, medication nonadherence, and polysubstance abuse.  She was initially diagnosed with  heart failure when she presented in June 2021, with dyspnea and volume overload.  Echocardiogram at that time showed an EF of 35 to 40% with grade 2 diastolic dysfunction.  This was followed by stress testing, which was nonischemic, and she was treated for nonischemic cardiomyopathy.  Unfortunately, she struggled affording her medications.  In January 2022, she was admitted with COVID-19 and found to have an EF of 20-25%.  Following this, she was followed closely in cardiology and heart failure clinic, and placed on GDMT.  She was readmitted in June 2022 in the setting of cocaine use.  Echo during that admission showed an EF of 30 to 35% with global hypokinesis, grade 2 diastolic dysfunction, mildly reduced RV function, and moderate TR.  Following discharge, she did not pick up any of her medications required readmission July 2022 due to marked volume overload.  In the setting of ongoing LV dysfunction despite maximally tolerated GDMT, she was referred to electrophysiology.  Cardiac MRI was performed and showed an EF of 42% with no LGE, and normal RV function.  In this setting, ICD therapy was not indicated.  She was referred to genetic counseling in the setting of her mother also having had a cardiomyopathy.  A follow-up limited echo in May 2023 showed an EF of 25 to 30%, and she was referred back to electrophysiology, but placement of [place] ICD was cancelled due to anemia, which was felt to be 2/2 menorrhagia  w/ h/o endometrial polyp and noncompliance w/ oral contraceptives.  At cardiology office visit in 01/2022, she was massively volume overloaded, and she was admitted for aggressive diuresis.  She was discharged home at 101.5 kg, and has since f/u w/ CHF clinic.  Wt was up to 110.7 kg @ 05/27/2022 visit, though notes indicate that she was euvolemic and no changes were made to meds.   Unfortunately, patient has noted progressive lower extremity edema, increasing abdominal girth, weight gain up to 250 pounds on her  home scale, dyspnea on exertion, fatigue, and orthopnea.  She has missed a few heart failure clinic follow-up appointments recently.  Because of progressive symptoms, she presented to the emergency department on March 23.  Here, she was found to be massively volume overloaded.  Chest x-ray with cardiomegaly and pulmonary edema.  BNP 1400.4.  Renal function relatively stable with a BUN/creatinine of 28 and 1.23 respectively.  Glucose was elevated at 244.  H&H relatively stable.  She has been placed on intravenous Lasix and we have been asked to evaluate.  She notes that though she is prescribed torsemide 60 mg twice daily at home, she has only been taking 40 mg daily.  She reports compliance with her other heart failure medications.  She says she last used cocaine about 1 month ago.  Prior to that, she was using every other day.  She denies chest pain, palpitations, dizziness, syncope, or early satiety.  Inpatient Medications     calcium carbonate  1 tablet Oral TID WC   dapagliflozin propanediol  10 mg Oral Daily   enoxaparin (LOVENOX) injection  0.5 mg/kg Subcutaneous Q24H   famotidine  20 mg Oral BID   ferrous sulfate  325 mg Oral Q breakfast   furosemide  80 mg Intravenous Q8H   insulin glargine-yfgn  15 Units Subcutaneous BID   metolazone  2.5 mg Oral Once   [START ON 07/26/2022] potassium chloride SA  40 mEq Oral Daily    Family History    Family History  Problem Relation Age of Onset   Heart failure Mother        Onset of heart failure 1s.  Died in 04-May-2022.   Diabetes Mother    Hypertension Father    Diabetes Father    She indicated that her mother is deceased. She indicated that her father is alive.   Social History    Social History   Socioeconomic History   Marital status: Single    Spouse name: Not on file   Number of children: Not on file   Years of education: Not on file   Highest education level: Not on file  Occupational History   Not on file  Tobacco Use    Smoking status: Former    Packs/day: .5    Types: Cigarettes    Quit date: 2022    Years since quitting: 2.2   Smokeless tobacco: Never  Vaping Use   Vaping Use: Never used  Substance and Sexual Activity   Alcohol use: Not Currently    Comment: ~ 4 shots/day on weekends only   Drug use: Not Currently    Types: Cocaine    Comment: Admits to using cocaine up and will February 2024.   Sexual activity: Not Currently    Birth control/protection: None  Other Topics Concern   Not on file  Social History Narrative   Lives locally.  Does not routinely exercise.  Has been using cocaine.   Social Determinants of Health  Financial Resource Strain: High Risk (06/16/2020)   Overall Financial Resource Strain (CARDIA)    Difficulty of Paying Living Expenses: Hard  Food Insecurity: No Food Insecurity (02/10/2022)   Hunger Vital Sign    Worried About Running Out of Food in the Last Year: Never true    Ran Out of Food in the Last Year: Never true  Transportation Needs: No Transportation Needs (02/10/2022)   PRAPARE - Hydrologist (Medical): No    Lack of Transportation (Non-Medical): No  Physical Activity: Not on file  Stress: Not on file  Social Connections: Not on file  Intimate Partner Violence: Not At Risk (02/10/2022)   Humiliation, Afraid, Rape, and Kick questionnaire    Fear of Current or Ex-Partner: No    Emotionally Abused: No    Physically Abused: No    Sexually Abused: No     Review of Systems    General:  No chills, fever, night sweats. +++  Weight gain. Cardiovascular:  No chest pain, +++ dyspnea on exertion, +++ lower extremity and flank/abdominal edema, +++ orthopnea, no palpitations, paroxysmal nocturnal dyspnea. Dermatological: No rash, lesions/masses Respiratory: No cough, +++ dyspnea Urologic: No hematuria, dysuria Abdominal:   +++ Increase abdominal girth.  No nausea, vomiting, diarrhea, bright red blood per rectum, melena, or  hematemesis Neurologic:  No visual changes, wkns, changes in mental status. All other systems reviewed and are otherwise negative except as noted above.  Physical Exam    Blood pressure 99/63, pulse (!) 103, temperature 98.4 F (36.9 C), temperature source Oral, resp. rate 20, height 5\' 7"  (1.702 m), weight 113.4 kg, last menstrual period 07/19/2022, SpO2 99 %.   General: Pleasant, NAD Psych: Normal affect. Neuro: Alert and oriented X 3. Moves all extremities spontaneously. HEENT: Normal  Neck: Supple.  JVD to jaw.  No bruits. Lungs:  Resp regular and unlabored, bibasilar crackles. Heart: RRR no s3, s4, or murmurs. Abdomen: Firm with 2+ abdominal and flank edema.  Nontender.  Bowel sounds present x 4. Extremities: No clubbing or cyanosis.  3+ bilateral lower extremity edema to the hips.  DP/PT2+, Radials 2+ and equal bilaterally.  Labs    Cardiac Enzymes Recent Labs  Lab 07/24/22 1729 07/24/22 1932  TROPONINIHS 13 14     BNP    Component Value Date/Time   BNP 1,400.4 (H) 07/24/2022 1619   Lab Results  Component Value Date   WBC 5.5 07/25/2022   HGB 9.4 (L) 07/25/2022   HCT 32.9 (L) 07/25/2022   MCV 84.1 07/25/2022   PLT 393 07/25/2022    Recent Labs  Lab 07/24/22 1619 07/25/22 0510  NA 132* 132*  K 3.7 3.4*  CL 96* 96*  CO2 25 26  BUN 28* 28*  CREATININE 1.23* 1.09*  CALCIUM 6.0* 6.0*  PROT 6.2*  --   BILITOT 0.7  --   ALKPHOS 144*  --   ALT 13  --   AST 19  --   GLUCOSE 244* 221*   Lab Results  Component Value Date   CHOL 216 (H) 05/27/2022   HDL 46 05/27/2022   LDLCALC 145 (H) 05/27/2022   TRIG 124 05/27/2022   Lab Results  Component Value Date   HGBA1C 9.6 (H) 02/10/2022      Radiology Studies    DG Chest 2 View  Result Date: 07/24/2022 CLINICAL DATA:  CHF EXAM: CHEST - 2 VIEW COMPARISON:  Chest x-ray 02/09/2022 FINDINGS: Enlarged cardiac silhouette. No pericardial effusion identified on lateral  view. Otherwise the heart and mediastinal  contours are within normal limits. No focal consolidation. Left mid lung zone linear atelectasis versus scarring. Interval increased interstitial markings. No pleural effusion. No pneumothorax. No acute osseous abnormality. IMPRESSION: Cardiomegaly with pulmonary edema. Electronically Signed   By: Iven Finn M.D.   On: 07/24/2022 17:04    ECG & Cardiac Imaging    Twelve-lead ECG not performed.  Assessment & Plan    1.  Acute on chronic heart failure with reduced ejection fraction/nonischemic cardiomyopathy/anasarca: EF 25 to 30% by echo in May 2023 with grade 2 diastolic dysfunction.  Patient required admission October 2023 with aggressive diuresis and discharge weight of 101.5 kg.  She reports compliance with most of her heart failure medications but admits to taking only 40 mg of torsemide daily instead of 60 twice daily as prescribed.  She also continue to use cocaine up until 1 month ago.  She notes progressive weight gain in the outpatient setting and is now up about 12 kg since her October discharge.  She is massively volume overloaded on examination.  Blood pressure is currently soft and therefore we will hold off on home doses of carvedilol, Entresto, and spironolactone for the time being.  Resume home dose of [place].  We have added metolazone for today and will increase Lasix to 80 mg every 8 hours.  Strict I's and O's and daily weights.  Needs significant amount of heart failure education.  If she can prove compliance, would plan to refer back to electrophysiology, as she was previously felt to require a subcutaneous ICD, which was delayed secondary to anemia last summer.  2.  Essential hypertension: Blood pressure currently soft.  Home medications currently on hold including carvedilol, Entresto, and spironolactone.  Will look to resume these as pressure allows.  3.  Type 2 diabetes mellitus: Poorly controlled.  A1c was 9.6 in October.  Continue Farxiga.  Management per internal  medicine.  4.  Stage II chronic kidney disease: Stable.  Follow with diuresis.  5.  Menorrhagia/microcytic anemia: Stable today.  She has not been taking oral contraceptives as prescribed because she cannot afford them.  Perhaps case manager can assist while she is hospitalized.  6.  Syncope: No recent recurrence.  Will need to be seen again by electrophysiology in the outpatient setting given candidacy for subcutaneous ICD.  7.  Medication nonadherence: Patient says that for the most part she has been taking her medicines over the past year.  Discussed the importance of taking diuretics as prescribed as well as not missing appointments.  8.  Polysubstance abuse: Notes that she had been using cocaine every other day until her mother passed in December.  Says she last used 1 month ago.  Will check tox screen.   Risk Assessment/Risk Scores:        New York Heart Association (NYHA) Functional Class NYHA Class IV    Signed, Murray Hodgkins, NP 07/25/2022, 11:05 AM  For questions or updates, please contact   Please consult [email or web address] for contact info under Cardiology/STEMI.

## 2022-07-25 NOTE — ED Notes (Signed)
US at bedside for Echo

## 2022-07-25 NOTE — ED Notes (Signed)
Pt ambulatory to bathroom independently with steady gait.

## 2022-07-25 NOTE — ED Notes (Addendum)
Hospitalist notified of critical calcium level. No new orders at this time

## 2022-07-25 NOTE — TOC Initial Note (Signed)
Transition of Care [facility]) - Initial/Assessment Note    Patient Details  Name: Molly Howard MRN: [id number] Date of Birth: 1986/02/19  Transition of Care [facility]) CM/SW Contact:    Candie Chroman, LCSW Phone Number: 07/25/2022, 10:36 AM  Clinical Narrative:   Readmission prevention screen complete. CSW met with patient. No supports at bedside. CSW introduced role and explained that discharge planning would be discussed. PCP is [name], DO at [facility]. Friends and family transport her to appointments. She uses [facility]. Patient reports having difficulty affording copays at times but family and friends can assist her if needed. Patient lives home with her sister. Her mother passed away in 2023-05-31. No home health or DME use prior to admission. Patient confirmed she has a scale at home and weighs herself daily to monitor weight/fluid buildup. Patient is working on obtaining disability. No further concerns. CSW encouraged patient to contact CSW as needed. CSW will continue to follow patient for support and facilitate return home once stable. Her sister will transport her home at discharge.               Expected Discharge Plan: Home/Self Care Barriers to Discharge: Continued Medical Work up   Patient Goals and CMS Choice            Expected Discharge Plan and Services     Post Acute Care Choice: NA Living arrangements for the past 2 months: Single Family Home                                      Prior Living Arrangements/Services Living arrangements for the past 2 months: Single Family Home Lives with:: Siblings Patient language and need for interpreter reviewed:: Yes Do you feel safe going back to the place where you live?: Yes      Need for Family Participation in Patient Care: Yes (Comment) Care giver support system in place?: Yes (comment)   Criminal Activity/Legal Involvement Pertinent to Current Situation/Hospitalization: No - Comment as  needed  Activities of Daily Living      Permission Sought/Granted                  Emotional Assessment Appearance:: Appears stated age Attitude/Demeanor/Rapport: Engaged, Gracious Affect (typically observed): Accepting, Appropriate, Calm, Pleasant Orientation: : Oriented to Self, Oriented to  Time, Oriented to Place, Oriented to Situation Alcohol / Substance Use: Not Applicable Psych Involvement: No (comment)  Admission diagnosis:  Acute on chronic combined systolic and diastolic ACC/AHA stage C congestive heart failure (HCC) [I50.43] Patient Active Problem List   Diagnosis Date Noted   Hypocalcemia 07/25/2022   Acute on chronic combined systolic and diastolic ACC/AHA stage C congestive heart failure ([place]) 07/24/2022   GERD without esophagitis 07/24/2022   Cocaine abuse ([place]) 02/12/2022   Acute pulmonary edema with congestive heart failure (HCC)    Acute on chronic HFrEF (heart failure with reduced ejection fraction) ([place]) 02/09/2022   Syncope 02/09/2022   Menorrhagia with regular cycle 06/22/2021   Dyslipidemia 11/27/2020   Type II diabetes mellitus with renal manifestations ([place]) 11/27/2020   Chronic kidney disease (CKD), stage II (mild) 11/27/2020   Iron deficiency anemia 11/27/2020   Hypomagnesemia 10/17/2020   Anasarca 10/11/2020   Dyspnea 05/21/2020   Elevated troponin 05/20/2020   Essential hypertension 05/20/2020   Diabetes mellitus, type II ([place]) 05/20/2020   Chronic anemia 05/20/2020   Reactive thrombocytosis 05/20/2020  Obesity (BMI 30-39.9) 05/20/2020   Chronic combined systolic and diastolic heart failure ([place]) 11/01/2019   Financial difficulties 11/01/2019   Medication management 11/01/2019   Lung nodule 11/01/2019   Dilated cardiomyopathy ([place]) 11/01/2019   Polysubstance abuse ([place]) 11/01/2019   Tobacco use 11/01/2019   HFrEF (heart failure with reduced ejection fraction) ([place])    Swelling    PCP:  [name], DO Pharmacy:   [place], [place] - [address] [place] [address] Phone: [phone] Fax: [phone]     Social Determinants of Health ([place]) Social History: [place]: No Food Insecurity (02/10/2022)  Housing: Low Risk  (02/10/2022)  Transportation Needs: No Transportation Needs (02/10/2022)  Utilities: Not At Risk (02/10/2022)  Depression (PHQ2-9): Low Risk  (02/04/2022)  Financial Resource Strain: High Risk (06/16/2020)  Tobacco Use: Medium Risk (07/24/2022)   SDOH Interventions:     Readmission Risk Interventions    07/25/2022   10:35 AM 02/15/2022    2:34 PM 12/07/2020    3:29 PM  Readmission Risk Prevention Plan  Transportation Screening Complete Complete Complete  PCP or Specialist Appt within 3-5 Days Complete Complete   Social Work Consult for [place] Planning/Counseling Complete Complete   Palliative Care Screening Not Applicable Not Applicable   Medication Review Press photographer) Complete Complete Complete  PCP or Specialist appointment within 3-5 days of discharge   Complete  [place]RI or [place]   Complete  SW Recovery Care/Counseling Consult   Complete  [place]   Not Applicable

## 2022-07-25 NOTE — ED Notes (Signed)
Pt hooked back up to the monitor and pt educated that it is important to keep her monitoring on for now. Pt agreeable at this time

## 2022-07-25 NOTE — ED Notes (Signed)
Pt removed vitals monitoring, RN reapplied

## 2022-07-26 ENCOUNTER — Other Ambulatory Visit (HOSPITAL_COMMUNITY): Payer: Self-pay

## 2022-07-26 DIAGNOSIS — F1410 Cocaine abuse, uncomplicated: Secondary | ICD-10-CM | POA: Diagnosis not present

## 2022-07-26 DIAGNOSIS — E1129 Type 2 diabetes mellitus with other diabetic kidney complication: Secondary | ICD-10-CM | POA: Diagnosis not present

## 2022-07-26 DIAGNOSIS — E785 Hyperlipidemia, unspecified: Secondary | ICD-10-CM | POA: Diagnosis not present

## 2022-07-26 DIAGNOSIS — I5043 Acute on chronic combined systolic (congestive) and diastolic (congestive) heart failure: Secondary | ICD-10-CM | POA: Diagnosis not present

## 2022-07-26 LAB — BASIC METABOLIC PANEL
Anion gap: 15 (ref 5–15)
BUN: 26 mg/dL — ABNORMAL HIGH (ref 6–20)
CO2: 31 mmol/L (ref 22–32)
Calcium: 7.3 mg/dL — ABNORMAL LOW (ref 8.9–10.3)
Chloride: 96 mmol/L — ABNORMAL LOW (ref 98–111)
Creatinine, Ser: 1.11 mg/dL — ABNORMAL HIGH (ref 0.44–1.00)
GFR, Estimated: >60 mL/min (ref >60)
Glucose, Bld: 102 mg/dL — ABNORMAL HIGH (ref 70–99)
Potassium: 4.6 mmol/L (ref 3.5–5.1)
Sodium: 137 mmol/L (ref 135–145)

## 2022-07-26 LAB — CALCIUM, IONIZED: Calcium, Ionized, Serum: 3.4 mg/dL — ABNORMAL LOW (ref 4.5–5.6)

## 2022-07-26 LAB — GLUCOSE, CAPILLARY
Glucose-Capillary: 174 mg/dL — ABNORMAL HIGH (ref 70–99)
Glucose-Capillary: 172 mg/dL — ABNORMAL HIGH (ref 70–99)
Glucose-Capillary: 156 mg/dL — ABNORMAL HIGH (ref 70–99)
Glucose-Capillary: 186 mg/dL — ABNORMAL HIGH (ref 70–99)

## 2022-07-26 MED ORDER — SPIRONOLACTONE 12.5 MG HALF TABLET
12.5000 mg | ORAL_TABLET | Freq: Every day | ORAL | Status: DC
  Administered 2022-07-26 – 2022-07-29 (×3): 12.5 mg via ORAL
  Filled 2022-07-26 (×4): qty 1

## 2022-07-26 MED ORDER — CARVEDILOL 3.125 MG PO TABS
3.1250 mg | ORAL_TABLET | Freq: Two times a day (BID) | ORAL | Status: DC
  Administered 2022-07-26 – 2022-08-04 (×16): 3.125 mg via ORAL
  Filled 2022-07-26 (×16): qty 1

## 2022-07-26 MED ORDER — FUROSEMIDE 10 MG/ML IJ SOLN
80.0000 mg | Freq: Two times a day (BID) | INTRAMUSCULAR | Status: DC
  Administered 2022-07-26 – 2022-07-27 (×2): 80 mg via INTRAVENOUS
  Filled 2022-07-26 (×2): qty 8

## 2022-07-26 NOTE — Assessment & Plan Note (Addendum)
Patient admitted with dyspnea, orthopnea, 25 pound weight gain and significant edema involving lower extremities and flanks.  Most likely secondary to being noncompliant and continue to use cocaine. Echocardiogram done in 05/23 with a EF of 25 to 30% and grade 2 diastolic dysfunction.  Cardiology is on board. Repeat echocardiogram with similar findings and a small circumferential pericardial effusion with no evidence of cardiac tamponade. -Home Entresto was restarted -Increasing the dose of Lasix to 80 mg every 8 hourly -Added metolazone -Continue Farxiga -Strict intake and output -Daily weight and BMP -Repeat echocardiogram

## 2022-07-26 NOTE — TOC Benefit Eligibility Note (Signed)
Patient Teacher, English as a foreign language completed.    The patient is currently admitted and upon discharge could be taking Entresto.  The current 30 day co-pay is $4.00.      Patient Teacher, English as a foreign language completed.    The patient is currently admitted and upon discharge could be taking [place].  The current 30 day co-pay is $4.00.   The patient is insured through [employer]   This test claim was processed through [place] amounts may vary at other pharmacies due to [employer], or as the patient moves through the different stages of their insurance plan.

## 2022-07-26 NOTE — Consult Note (Addendum)
   Heart Failure Nurse Navigator Note  HFrEF 5 to 30%.  Left ventricular diastolic parameters were indeterminate.  Right ventricular systolic function is mildly reduced.  Moderately elevated pulmonary artery systolic pressures.  Mildly dilated left atria.  Mild mitral regurgitation.  Tricuspid regurgitation is moderate to severe.  She presented to the emergency room with complaints of dyspnea on exertion, PND and orthopnea, lower extremity edema.  BNP 1400.  Chest x-ray with cardiomegaly and pulmonary edema.  Reported weight gain of 15 to 25 pounds over the last week.  UDS positive for cocaine   Comorbidities:  Diabetes  Chronic kidney disease stage II Hypertension Substance abuse Microcytic anemia Obesity  Medications:  Farxiga 10 mg daily Furosemide 80 mg IV every 8 hours Potassium chloride 40 mEq 2 times a day Entresto 24/26 mg 2 times a day  Labs:  Sodium 137, potassium 4.6, chloride 96, CO2 31, BUN 26, creatinine 1.11, estimated GFR greater than 60. Weight 116.3 kg Intake 50 mL Output 2000 mL  Initial meeting with patient on this admission.  She was sitting up in the chair at bedside in no acute distress.  She states that her lower extremity edema is much improved.   She admitted to eating more fast food here lately.  Although she states that she tries to even it  out by eating a salad if she has eaten some fast food.  States that she was weighing herself daily.  Discussed the importance of calling her providers when she sees a 2 to 3 pound weight gain overnight or 5 pounds within a week or changes in her symptoms such as what brought her into the hospital.  She states that she is living with her sister and they are both still dealing with the death of her mother in 05/10/2023.  She had no further questions and we will continue to follow along.  She has an appointment in the outpatient heart failure clinic on April 1 at 1030.  She has a 23% no-show which is 18 out of 80  appointments.  Pricilla Riffle RN [facility]

## 2022-07-26 NOTE — Progress Notes (Signed)
Rounding Note    Patient Name: Molly Howard Date of Encounter: 07/26/2022  [place] Cardiologist: [name], MD   Subjective   Still with significant shortness of breath and lower extremity edema.  She is -3.7 L for the admission.  Inpatient Medications    Scheduled Meds:  calcium carbonate  1 tablet Oral TID WC   carvedilol  3.125 mg Oral BID WC   dapagliflozin propanediol  10 mg Oral Daily   enoxaparin (LOVENOX) injection  0.5 mg/kg Subcutaneous Q24H   famotidine  20 mg Oral BID   ferrous sulfate  325 mg Oral Q breakfast   furosemide  80 mg Intravenous Q8H   insulin aspart  0-5 Units Subcutaneous QHS   insulin aspart  0-9 Units Subcutaneous TID WC   insulin glargine-yfgn  15 Units Subcutaneous BID   potassium chloride SA  40 mEq Oral BID   sacubitril-valsartan  1 tablet Oral BID   spironolactone  12.5 mg Oral Daily   Continuous Infusions:  PRN Meds: acetaminophen **OR** acetaminophen, albuterol, magnesium hydroxide, ondansetron **OR** ondansetron (ZOFRAN) IV, traZODone   Vital Signs    Vitals:   07/26/22 0117 07/26/22 0500 07/26/22 0700 07/26/22 0812  BP: (!) 139/95 124/69  (!) 138/102  Pulse: (!) 106 79  98  Resp: 20     Temp: 98.7 F (37.1 C) 97.6 F (36.4 C)    TempSrc:  Oral    SpO2: 100% 98%  100%  Weight:   116.3 kg   Height:        Intake/Output Summary (Last 24 hours) at 07/26/2022 1313 Last data filed at 07/26/2022 0924 Gross per 24 hour  Intake --  Output 2900 ml  Net -2900 ml      07/26/2022    7:00 AM 07/24/2022    4:12 PM 06/08/2022   10:29 AM  Last 3 Weights  Weight (lbs) 256 lb 8 oz 250 lb 240 lb  Weight (kg) 116.348 kg 113.399 kg 108.863 kg      Telemetry    Sinus tachycardia- Personally Reviewed  ECG     - Personally Reviewed  Physical Exam   GEN: No acute distress.   Neck: Elevated JVD Cardiac: RRR, no murmurs, rubs, or gallops.  Moderate bilateral leg edema Respiratory: Diminished breath sounds at  the base. GI: Soft, nontender, non-distended  MS:  No deformity. Neuro:  Nonfocal  Psych: Normal affect   Labs    High Sensitivity Troponin:   Recent Labs  Lab 07/24/22 1729 07/24/22 1932  TROPONINIHS 13 14     Chemistry Recent Labs  Lab 07/24/22 1619 07/25/22 0510 07/26/22 0412  NA 132* 132* 137  K 3.7 3.4* 4.6  CL 96* 96* 96*  CO2 25 26 31   GLUCOSE 244* 221* 102*  BUN 28* 28* 26*  CREATININE 1.23* 1.09* 1.11*  CALCIUM 6.0* 6.0* 7.3*  PROT 6.2*  --   --   ALBUMIN 1.7*  --   --   AST 19  --   --   ALT 13  --   --   ALKPHOS 144*  --   --   BILITOT 0.7  --   --   GFRNONAA 58* >60 >60  ANIONGAP 11 10 15     Lipids No results for input(s): "CHOL", "TRIG", "HDL", "LABVLDL", "LDLCALC", "CHOLHDL" in the last 168 hours.  Hematology Recent Labs  Lab 07/24/22 1619 07/25/22 0510  WBC 5.8 5.5  RBC 4.20 3.91  HGB 10.2* 9.4*  HCT 35.4* 32.9*  MCV 84.3 84.1  MCH 24.3* 24.0*  MCHC 28.8* 28.6*  RDW 19.3* 19.2*  PLT 395 393   Thyroid No results for input(s): "TSH", "FREET4" in the last 168 hours.  BNP Recent Labs  Lab 07/24/22 1619  BNP 1,400.4*    DDimer No results for input(s): "DDIMER" in the last 168 hours.   Radiology    ECHOCARDIOGRAM COMPLETE  Result Date: 07/25/2022    ECHOCARDIOGRAM REPORT   Patient Name:   Molly Howard Date of Exam: 07/25/2022 Medical Rec #:  [id number]     Height:       67.0 in Accession #:    [id number]    Weight:       250.0 lb Date of Birth:  April 04, 1986     BSA:          2.223 m Patient Age:    37 years      BP:           99/63 mmHg Patient Gender: F             HR:           97 bpm. Exam Location:  [facility] Procedure: 2D Echo Indications:     Cardiomegaly I51.7  History:         Patient has prior history of Echocardiogram examinations.  Sonographer:     Wayland Salinas RDCS Referring Phys:  [place] Phys: Ida Rogue MD IMPRESSIONS  1. Left ventricular ejection fraction, by estimation, is 25 to 30%. Left ventricular  ejection fraction by 2D MOD biplane is 21.6 %. The left ventricle has severely decreased function. The left ventricle demonstrates global hypokinesis. Left ventricular diastolic parameters are indeterminate.  2. Right ventricular systolic function is mildly reduced. The right ventricular size is normal. There is moderately elevated pulmonary artery systolic pressure. The estimated right ventricular systolic pressure is [id number] mmHg.  3. Left atrial size was mildly dilated.  4. A small pericardial effusion is present. The pericardial effusion is circumferential. There is no evidence of cardiac tamponade.  5. The aortic valve is tricuspid. Aortic valve regurgitation is not visualized. No aortic stenosis is present.  6. The mitral valve is normal in structure. Mild mitral valve regurgitation. No evidence of mitral stenosis.  7. Tricuspid valve regurgitation is moderate to severe. FINDINGS  Left Ventricle: Left ventricular ejection fraction, by estimation, is 25 to 30%. Left ventricular ejection fraction by 2D MOD biplane is 21.6 %. The left ventricle has severely decreased function. The left ventricle demonstrates global hypokinesis. The left ventricular internal cavity size was normal in size. There is no left ventricular hypertrophy. Left ventricular diastolic parameters are indeterminate. Right Ventricle: The right ventricular size is normal. No increase in right ventricular wall thickness. Right ventricular systolic function is mildly reduced. There is moderately elevated pulmonary artery systolic pressure. The tricuspid regurgitant velocity is 3.19 m/s, and with an assumed right atrial pressure of 10 mmHg, the estimated right ventricular systolic pressure is [id number] mmHg. Left Atrium: Left atrial size was mildly dilated. Right Atrium: Right atrial size was normal in size. Pericardium: A small pericardial effusion is present. The pericardial effusion is circumferential. There is no evidence of cardiac tamponade. Mitral  Valve: The mitral valve is normal in structure. There is mild calcification of the mitral valve leaflet(s). Mild mitral valve regurgitation. No evidence of mitral valve stenosis. Tricuspid Valve: The tricuspid valve is normal in structure. Tricuspid valve regurgitation is moderate to severe.  No evidence of tricuspid stenosis. Aortic Valve: The aortic valve is tricuspid. Aortic valve regurgitation is not visualized. No aortic stenosis is present. Aortic valve peak gradient measures 8.4 mmHg. Pulmonic Valve: The pulmonic valve was normal in structure. Pulmonic valve regurgitation is mild. No evidence of pulmonic stenosis. Aorta: The aortic root is normal in size and structure. Venous: The inferior vena cava was not well visualized. IAS/Shunts: No atrial level shunt detected by color flow Doppler.  LEFT VENTRICLE PLAX 2D                        Biplane EF (MOD) LVIDd:         5.40 cm         LV Biplane EF:   Left LVIDs:         4.80 cm                          ventricular LV PW:         1.20 cm                          ejection LV IVS:        1.30 cm                          fraction by LVOT diam:     2.10 cm                          2D MOD LV SV:         55                               biplane is LV SV Index:   25                               21.6 %. LVOT Area:     3.46 cm  LV Volumes (MOD) LV vol d, MOD    135.0 ml A2C:                           3D Volume EF: LV vol d, MOD    123.0 ml      3D EF:        33 % A4C:                           LV EDV:       222 ml LV vol s, MOD    116.0 ml      LV ESV:       149 ml A2C:                           LV SV:        73 ml LV vol s, MOD    89.5 ml A4C: LV SV MOD A2C:   19.0 ml LV SV MOD A4C:   123.0 ml LV SV MOD BP:    28.1 ml RIGHT VENTRICLE RV Basal diam:  4.30 cm RV S prime:     19.90 cm/s TAPSE (M-mode): 1.7 cm LEFT ATRIUM  Index        RIGHT ATRIUM           Index LA diam:        4.30 cm 1.93 cm/m   RA Area:     26.10 cm LA Vol (A2C):   88.8 ml 39.94 ml/m  RA  Volume:   95.30 ml  42.86 ml/m LA Vol (A4C):   72.8 ml 32.74 ml/m LA Biplane Vol: 82.1 ml 36.93 ml/m  AORTIC VALVE                 PULMONIC VALVE AV Area (Vmax): 2.39 cm     PV Vmax:        0.87 m/s AV Vmax:        144.50 cm/s  PV Peak grad:   3.0 mmHg AV Peak Grad:   8.4 mmHg     RVOT Peak grad: 1 mmHg LVOT Vmax:      99.90 cm/s LVOT Vmean:     65.350 cm/s LVOT VTI:       0.158 m  AORTA Ao Root diam: 2.80 cm MV E velocity: 123.00 cm/s  TRICUSPID VALVE MV A velocity: 46.30 cm/s   TR Peak grad:   40.7 mmHg MV E/A ratio:  2.66         TR Vmax:        319.00 cm/s                              SHUNTS                             Systemic VTI:  0.16 m                             Systemic Diam: 2.10 cm Ida Rogue MD Electronically signed by Ida Rogue MD Signature Date/Time: 07/25/2022/12:43:03 PM    Final    DG Chest 2 View  Result Date: 07/24/2022 CLINICAL DATA:  CHF EXAM: CHEST - 2 VIEW COMPARISON:  Chest x-ray 02/09/2022 FINDINGS: Enlarged cardiac silhouette. No pericardial effusion identified on lateral view. Otherwise the heart and mediastinal contours are within normal limits. No focal consolidation. Left mid lung zone linear atelectasis versus scarring. Interval increased interstitial markings. No pleural effusion. No pneumothorax. No acute osseous abnormality. IMPRESSION: Cardiomegaly with pulmonary edema. Electronically Signed   By: Iven Finn M.D.   On: 07/24/2022 17:04    Cardiac Studies   Echocardiogram was done yesterday and was reviewed by me.  It showed an EF of 25 to 30% with moderate pulmonary hypertension, small pericardial effusion, mild mitral regurgitation and moderate to severe tricuspid regurgitation  Patient Profile     37 y.o. female a history of HFrEF, nonischemic cardiomyopathy, hypertension, type 2 diabetes mellitus, stage II chronic kidney disease, GERD, microcytic anemia, obesity, medication nonadherence, and polysubstance abuse including cocaine, who presented with  acute on chronic systolic heart failure  Assessment & Plan    1.  Acute on chronic systolic heart failure with severely reduced LV systolic function.  Massive volume overload as she is about 10 kg above her dry weight.  Will continue IV diuresis but decrease the frequently to twice daily to avoid too fast diuresis as she had good urine output. I resumed small dose spironolactone 12.5 mg once daily. Continue Farxiga 10 mg daily. I resumed small  dose carvedilol 3.125 mg twice daily. Continue Entresto. Will consider inotropic therapy only if situation does not improve.    2.  Polysubstance abuse including cocaine:She has been consistently positive for cocaine on urine drug screen.  As such, she is not a candidate for ICD placement.         For questions or updates, please contact [place] Please consult [email or web address] for contact info under        Signed, Kathlyn Sacramento, MD  07/26/2022, 1:13 PM

## 2022-07-26 NOTE — Discharge Instructions (Signed)

## 2022-07-26 NOTE — Progress Notes (Signed)
Progress Note   Patient: Molly Howard [id number] DOB: [birth year] DOA: 07/24/2022     2 DOS: the patient was seen and examined on 07/26/2022   Brief hospital course: Taken from H&P.  Molly Howard is a 37 y.o. female with medical history significant for combined systolic and diastolic CHF, stage II CKD, type diabetes mellitus, hypertension and nonischemic cardiomyopathy as well as for substance abuse, who presented to the emergency room with  worsening dyspnea with associated orthopnea, dyspnea on exertion paroxysmal nocturnal dyspnea over the last 3 days.  She admitted to bilateral lower extremity edema and 15 pound weight gain over the last week.  She has been having dry cough and wheezing.  No chest pain or palpitations.  No fever or chills.   ED course.  Vitals within normal range. Labs revealed mild hyponatremia 132 and hypochloremia 96, BUN of 28 and creatinine 1.23 close to baseline and calcium was 6 with albumin of 1.7,  BNP was 1400 and  High sensitive troponin was 13 and later 14.  CBC showed anemia close to baseline.  Blood glucose was 244. EKG:  Ordered but not done yet Imaging: Two-view chest x-ray showed cardiomegaly with pulmonary edema.  Patient admitted for IV diuresis.  3/24: Vital stable, labs with pseudohyponatremia secondary to hyperglycemia, potassium at 3.4, current pain improved to 1.09.  Hemoglobin at 9.4, CBG elevated at 218.  Patient has about 25 pound weight gain and significant edema involving lower extremity and flank.  Cardiology added metolazone.  Holding home Entresto and carvedilol due to borderline blood pressure.  Concern of being noncompliant.  UDS positive for cocaine.  3/25; hemodynamically stable, blood pressure started trending up so home Entresto was restarted.  UOP of 2 L, but weight apparently increased.  Most likely some error.  Renal function stable so we will continue diuresis.   Assessment and Plan: * Acute on chronic combined systolic and  diastolic ACC/AHA stage C congestive heart failure [facility]) Patient admitted with dyspnea, orthopnea, 25 pound weight gain and significant edema involving lower extremities and flanks.  Most likely secondary to being noncompliant and continue to use cocaine. Echocardiogram done in 05/23 with a EF of 25 to 30% and grade 2 diastolic dysfunction.  Cardiology is on board. Repeat echocardiogram with similar findings and a small circumferential pericardial effusion with no evidence of cardiac tamponade. -Home Entresto was restarted -Increasing the dose of Lasix to 80 mg every 8 hourly -Added metolazone -Continue Farxiga -Strict intake and output -Daily weight and BMP -Repeat echocardiogram  Type II diabetes mellitus with renal manifestations (HCC) -Continue Semglee 15 units twice daily -Continue Farxiga -SSI  Hypocalcemia - Corrected calcium 7.4. - We will replace calcium and follow level.  Dyslipidemia - We will continue statin therapy.  Chronic kidney disease (CKD), stage II (mild) Creatinine currently at baseline. -Monitor renal function while she is being diuresed  GERD without esophagitis - We will continue H2 blocker therapy.  Cocaine abuse ([place]) UDS still positive for cocaine. Counseling was provided. -This will increase mortality with her cardiac disease  Obesity (BMI 30-39.9) Estimated body mass index is 39.16 kg/m as calculated from the following:   Height as of this encounter: 5\' 7"  (1.702 m).   Weight as of this encounter: [id number] kg.   -Complicates overall prognosis    Subjective: Patient was sitting comfortably in chair when seen today.  No new concerns.  Physical Exam: Vitals:   07/26/22 0117 07/26/22 0500 07/26/22 0700 07/26/22 0812  BP: Marland Kitchen)  139/95 124/69  (!) 138/102  Pulse: (!) 106 79  98  Resp: 20     Temp: 98.7 F (37.1 C) 97.6 F (36.4 C)    TempSrc:  Oral    SpO2: 100% 98%  100%  Weight:   116.3 kg   Height:       General.  Morbidly obese lady,  in no acute distress. Pulmonary.  Lungs clear bilaterally, normal respiratory effort. CV.  Regular rate and rhythm, no JVD, rub or murmur. Abdomen.  Soft, nontender, nondistended, BS positive. CNS.  Alert and oriented .  No focal neurologic deficit. Extremities.  2+ LE edema, no cyanosis, pulses intact and symmetrical. Psychiatry.  Judgment and insight appears normal.   Data Reviewed: Prior data reviewed  Family Communication: Discussed with patient  Disposition: Status is: Inpatient Remains inpatient appropriate because: Severity of illness  Planned Discharge Destination: Home   Time spent: 44 minutes  This record has been created using Systems analyst. Errors have been sought and corrected,but may not always be located. Such creation errors do not reflect on the standard of care.   Author: Lorella Nimrod, MD 07/26/2022 12:21 PM  For on call review [email or web address].

## 2022-07-27 DIAGNOSIS — Z91148 Patient's other noncompliance with medication regimen for other reason: Secondary | ICD-10-CM

## 2022-07-27 DIAGNOSIS — I5043 Acute on chronic combined systolic (congestive) and diastolic (congestive) heart failure: Secondary | ICD-10-CM | POA: Diagnosis not present

## 2022-07-27 DIAGNOSIS — E1129 Type 2 diabetes mellitus with other diabetic kidney complication: Secondary | ICD-10-CM | POA: Diagnosis not present

## 2022-07-27 DIAGNOSIS — E785 Hyperlipidemia, unspecified: Secondary | ICD-10-CM | POA: Diagnosis not present

## 2022-07-27 LAB — GLUCOSE, CAPILLARY
Glucose-Capillary: 116 mg/dL — ABNORMAL HIGH (ref 70–99)
Glucose-Capillary: 175 mg/dL — ABNORMAL HIGH (ref 70–99)
Glucose-Capillary: 194 mg/dL — ABNORMAL HIGH (ref 70–99)
Glucose-Capillary: 260 mg/dL — ABNORMAL HIGH (ref 70–99)

## 2022-07-27 LAB — BASIC METABOLIC PANEL
Anion gap: 6 (ref 5–15)
BUN: 30 mg/dL — ABNORMAL HIGH (ref 6–20)
CO2: 31 mmol/L (ref 22–32)
Calcium: 6.8 mg/dL — ABNORMAL LOW (ref 8.9–10.3)
Chloride: 99 mmol/L (ref 98–111)
Creatinine, Ser: 1.29 mg/dL — ABNORMAL HIGH (ref 0.44–1.00)
GFR, Estimated: 54 mL/min — ABNORMAL LOW (ref >60)
Glucose, Bld: 148 mg/dL — ABNORMAL HIGH (ref 70–99)
Potassium: 4.1 mmol/L (ref 3.5–5.1)
Sodium: 136 mmol/L (ref 135–145)

## 2022-07-27 MED ORDER — FUROSEMIDE 10 MG/ML IJ SOLN
80.0000 mg | Freq: Two times a day (BID) | INTRAMUSCULAR | Status: DC
  Administered 2022-07-27: 80 mg via INTRAVENOUS
  Filled 2022-07-27: qty 8

## 2022-07-27 MED ORDER — SODIUM CHLORIDE 0.9% FLUSH: 3.0000 mL | INTRAVENOUS | Status: DC | PRN

## 2022-07-27 MED ORDER — ASPIRIN 81 MG PO CHEW
81.0000 mg | CHEWABLE_TABLET | ORAL | Status: AC
  Administered 2022-07-28: 81 mg via ORAL
  Filled 2022-07-27: qty 1

## 2022-07-27 MED ORDER — SODIUM CHLORIDE 0.9% FLUSH
3.0000 mL | Freq: Two times a day (BID) | INTRAVENOUS | Status: DC
  Administered 2022-07-27 – 2022-08-04 (×11): 3 mL via INTRAVENOUS

## 2022-07-27 MED ORDER — SODIUM CHLORIDE 0.9 % IV SOLN: INTRAVENOUS | Status: DC

## 2022-07-27 MED ORDER — BENZONATATE 100 MG PO CAPS
100.0000 mg | ORAL_CAPSULE | Freq: Two times a day (BID) | ORAL | Status: DC | PRN
  Administered 2022-07-27: 100 mg via ORAL
  Filled 2022-07-27: qty 1

## 2022-07-27 MED ORDER — SODIUM CHLORIDE 0.9 % IV SOLN
250.0000 mL | INTRAVENOUS | Status: DC | PRN
  Administered 2022-07-28: 250 mL via INTRAVENOUS

## 2022-07-27 NOTE — Plan of Care (Signed)
Patient is participating in goals of care and progressing in goals for discharge.  Celine Ahr, RN     Problem: Education: Goal: Ability to demonstrate management of disease process will improve Outcome: Progressing Goal: Ability to verbalize understanding of medication therapies will improve Outcome: Progressing Goal: Individualized Educational Video(s) Outcome: Progressing   Problem: Activity: Goal: Capacity to carry out activities will improve Outcome: Progressing   Problem: Cardiac: Goal: Ability to achieve and maintain adequate cardiopulmonary perfusion will improve Outcome: Progressing   Problem: Education: Goal: Ability to describe self-care measures that may prevent or decrease complications (Diabetes Survival Skills Education) will improve Outcome: Progressing Goal: Individualized Educational Video(s) Outcome: Progressing   Problem: Coping: Goal: Ability to adjust to condition or change in health will improve Outcome: Progressing   Problem: Fluid Volume: Goal: Ability to maintain a balanced intake and output will improve Outcome: Progressing   Problem: Health Behavior/Discharge Planning: Goal: Ability to identify and utilize available resources and services will improve Outcome: Progressing Goal: Ability to manage health-related needs will improve Outcome: Progressing   Problem: Metabolic: Goal: Ability to maintain appropriate glucose levels will improve Outcome: Progressing   Problem: Nutritional: Goal: Maintenance of adequate nutrition will improve Outcome: Progressing Goal: Progress toward achieving an optimal weight will improve Outcome: Progressing   Problem: Skin Integrity: Goal: Risk for impaired skin integrity will decrease Outcome: Progressing   Problem: Tissue Perfusion: Goal: Adequacy of tissue perfusion will improve Outcome: Progressing   Problem: Education: Goal: Knowledge of General Education information will  improve Description: Including pain rating scale, medication(s)/side effects and non-pharmacologic comfort measures Outcome: Progressing   Problem: Health Behavior/Discharge Planning: Goal: Ability to manage health-related needs will improve Outcome: Progressing   Problem: Clinical Measurements: Goal: Ability to maintain clinical measurements within normal limits will improve Outcome: Progressing Goal: Will remain free from infection Outcome: Progressing Goal: Diagnostic test results will improve Outcome: Progressing Goal: Respiratory complications will improve Outcome: Progressing Goal: Cardiovascular complication will be avoided Outcome: Progressing   Problem: Activity: Goal: Risk for activity intolerance will decrease Outcome: Progressing   Problem: Nutrition: Goal: Adequate nutrition will be maintained Outcome: Progressing   Problem: Coping: Goal: Level of anxiety will decrease Outcome: Progressing   Problem: Elimination: Goal: Will not experience complications related to bowel motility Outcome: Progressing Goal: Will not experience complications related to urinary retention Outcome: Progressing   Problem: Pain Managment: Goal: General experience of comfort will improve Outcome: Progressing   Problem: Safety: Goal: Ability to remain free from injury will improve Outcome: Progressing   Problem: Skin Integrity: Goal: Risk for impaired skin integrity will decrease Outcome: Progressing

## 2022-07-27 NOTE — Assessment & Plan Note (Signed)
Creatinine currently at baseline. -Monitor renal function while she is being diuresed

## 2022-07-27 NOTE — Progress Notes (Signed)
Progress Note   Patient: Molly Howard [id number] DOB: 30-Jul-1985 DOA: 07/24/2022     3 DOS: the patient was seen and examined on 07/27/2022   Brief hospital course: Taken from H&P.  Molly Howard is a 37 y.o. female with medical history significant for combined systolic and diastolic CHF, stage II CKD, type diabetes mellitus, hypertension and nonischemic cardiomyopathy as well as for substance abuse, who presented to the emergency room with  worsening dyspnea with associated orthopnea, dyspnea on exertion paroxysmal nocturnal dyspnea over the last 3 days.  She admitted to bilateral lower extremity edema and 15 pound weight gain over the last week.  She has been having dry cough and wheezing.  No chest pain or palpitations.  No fever or chills.   ED course.  Vitals within normal range. Labs revealed mild hyponatremia 132 and hypochloremia 96, BUN of 28 and creatinine 1.23 close to baseline and calcium was 6 with albumin of 1.7,  BNP was 1400 and  High sensitive troponin was 13 and later 14.  CBC showed anemia close to baseline.  Blood glucose was 244. EKG:  Ordered but not done yet Imaging: Two-view chest x-ray showed cardiomegaly with pulmonary edema.  Patient admitted for IV diuresis.  3/24: Vital stable, labs with pseudohyponatremia secondary to hyperglycemia, potassium at 3.4, current pain improved to 1.09.  Hemoglobin at 9.4, CBG elevated at 218.  Patient has about 25 pound weight gain and significant edema involving lower extremity and flank.  Cardiology added metolazone.  Holding home Entresto and carvedilol due to borderline blood pressure.  Concern of being noncompliant.  UDS positive for cocaine.  3/25; hemodynamically stable, blood pressure started trending up so home Entresto was restarted.  UOP of 2 L, but weight apparently increased.  Most likely some error.  Renal function stable so we will continue diuresis.  3/26: Vital stable.  Slight worsening of creatinine as compared to  yesterday but remained within her baseline.  Will continue diuresis after discussing with cardiology.  She might be going for right heart catheterization tomorrow for better evaluation of her volume status. Weight and UOP recorded seems inaccurate.   Assessment and Plan: * Acute on chronic combined systolic and diastolic ACC/AHA stage C congestive heart failure [facility]) Patient admitted with dyspnea, orthopnea, 25 pound weight gain and significant edema involving lower extremities and flanks.  Most likely secondary to being noncompliant and continue to use cocaine. Echocardiogram done in 05/23 with a EF of 25 to 30% and grade 2 diastolic dysfunction.  Cardiology is on board. Repeat echocardiogram with similar findings and a small circumferential pericardial effusion with no evidence of cardiac tamponade. -Home Entresto was restarted -Continue IV Lasix 80 mg twice daily -Added metolazone -Continue Farxiga -Strict intake and output -Daily weight and BMP -Might be going for right heart catheterization tomorrow  Type II diabetes mellitus with renal manifestations (HCC) -Continue Semglee 15 units twice daily -Continue Farxiga -SSI  Hypocalcemia - Corrected calcium 7.4. - We will replace calcium and follow level.  Dyslipidemia - We will continue statin therapy.  Chronic kidney disease (CKD), stage II (mild) Creatinine currently at baseline. -Monitor renal function while she is being diuresed  GERD without esophagitis - We will continue H2 blocker therapy.  Cocaine abuse ([place]) UDS still positive for cocaine. Counseling was provided. -This will increase mortality with her cardiac disease  Obesity (BMI 30-39.9) Estimated body mass index is 39.16 kg/m as calculated from the following:   Height as of this encounter: 5'  7" (1.702 m).   Weight as of this encounter: [id number] kg.   -Complicates overall prognosis    Subjective: Patient was sitting comfortably in chair when seen today.   No new complaints.  Physical Exam: Vitals:   07/26/22 2347 07/27/22 0423 07/27/22 0500 07/27/22 0842  BP: 109/71 104/67  (!) 128/90  Pulse: 96 97  99  Resp: 20 20    Temp: 97.9 F (36.6 C) 97.8 F (36.6 C)  98 F (36.7 C)  TempSrc:    Oral  SpO2: 97% 98%  98%  Weight:   115 kg   Height:       General.  Morbidly obese lady, in no acute distress. Pulmonary.  Lungs clear bilaterally, normal respiratory effort. CV.  Regular rate and rhythm, no JVD, rub or murmur. Abdomen.  Soft, nontender, nondistended, BS positive. CNS.  Alert and oriented .  No focal neurologic deficit. Extremities.  1+ LE edema, no cyanosis, pulses intact and symmetrical. Psychiatry.  Judgment and insight appears normal.   Data Reviewed: Prior data reviewed  Family Communication: Discussed with patient  Disposition: Status is: Inpatient Remains inpatient appropriate because: Severity of illness  Planned Discharge Destination: Home   Time spent: 43 minutes  This record has been created using Systems analyst. Errors have been sought and corrected,but may not always be located. Such creation errors do not reflect on the standard of care.   Author: Lorella Nimrod, MD 07/27/2022 12:22 PM  For on call review [email or web address].

## 2022-07-27 NOTE — Progress Notes (Signed)
   Heart Failure Nurse Navigator Note  Met with patient, she was sitting up in chair at bedside.  When questioned she does not feel she is back to baseline, still has more fluid, she states especially in her back.  She told me prior to admission she had cut back on her water pill due to the reason she thought she would get into kidney problems like her mom had before she died.  Discussed speaking with Darylene Price or her doctors if she feels she is getting into trouble with her medications.  She voices understanding and not adjusting medications on her own.  She states since the loss of her mother, her Dad who lives in [place] is more in her life.  Also discussed abstaining from cocaine use.  She lives with her sister.   She has no further questions.  Pricilla Riffle RN [facility]

## 2022-07-27 NOTE — Progress Notes (Signed)
Rounding Note    Patient Name: Molly Howard Date of Encounter: 07/27/2022  [place] Cardiologist: [name], MD   Subjective   Still with significant shortness of breath and lower extremity edema.  She is -4.3 L for the admission with slight worsening of renal function.  Inpatient Medications    Scheduled Meds:  calcium carbonate  1 tablet Oral TID WC   carvedilol  3.125 mg Oral BID WC   dapagliflozin propanediol  10 mg Oral Daily   enoxaparin (LOVENOX) injection  0.5 mg/kg Subcutaneous Q24H   famotidine  20 mg Oral BID   ferrous sulfate  325 mg Oral Q breakfast   furosemide  80 mg Intravenous BID   insulin aspart  0-5 Units Subcutaneous QHS   insulin aspart  0-9 Units Subcutaneous TID WC   insulin glargine-yfgn  15 Units Subcutaneous BID   sacubitril-valsartan  1 tablet Oral BID   spironolactone  12.5 mg Oral Daily   Continuous Infusions:  PRN Meds: acetaminophen **OR** acetaminophen, albuterol, magnesium hydroxide, ondansetron **OR** ondansetron (ZOFRAN) IV, traZODone   Vital Signs    Vitals:   07/26/22 2347 07/27/22 0423 07/27/22 0500 07/27/22 0842  BP: 109/71 104/67  (!) 128/90  Pulse: 96 97  99  Resp: 20 20    Temp: 97.9 F (36.6 C) 97.8 F (36.6 C)  98 F (36.7 C)  TempSrc:    Oral  SpO2: 97% 98%  98%  Weight:   115 kg   Height:        Intake/Output Summary (Last 24 hours) at 07/27/2022 1403 Last data filed at 07/27/2022 1200 Gross per 24 hour  Intake 892 ml  Output 1500 ml  Net -608 ml       07/27/2022    5:00 AM 07/26/2022    8:48 PM 07/26/2022    7:00 AM  Last 3 Weights  Weight (lbs) 253 lb 8 oz 253 lb 8 oz 256 lb 8 oz  Weight (kg) 114.987 kg 114.987 kg 116.348 kg      Telemetry    Sinus tachycardia- Personally Reviewed  ECG     - Personally Reviewed  Physical Exam   GEN: No acute distress.   Neck: Elevated JVD Cardiac: RRR, no murmurs, rubs, or gallops.  Moderate bilateral leg edema Respiratory: Diminished  breath sounds at the base. GI: Soft, no tenderness.  The abdomen is distended. MS:  No deformity. Neuro:  Nonfocal  Psych: Normal affect   Labs    High Sensitivity Troponin:   Recent Labs  Lab 07/24/22 1729 07/24/22 1932  TROPONINIHS 13 14      Chemistry Recent Labs  Lab 07/24/22 1619 07/25/22 0510 07/26/22 0412 07/27/22 0620  NA 132* 132* 137 136  K 3.7 3.4* 4.6 4.1  CL 96* 96* 96* 99  CO2 25 26 31 31   GLUCOSE 244* 221* 102* 148*  BUN 28* 28* 26* 30*  CREATININE 1.23* 1.09* 1.11* 1.29*  CALCIUM 6.0* 6.0* 7.3* 6.8*  PROT 6.2*  --   --   --   ALBUMIN 1.7*  --   --   --   AST 19  --   --   --   ALT 13  --   --   --   ALKPHOS 144*  --   --   --   BILITOT 0.7  --   --   --   GFRNONAA 58* >60 >60 54*  ANIONGAP 11 10 15  6  Lipids No results for input(s): "CHOL", "TRIG", "HDL", "LABVLDL", "LDLCALC", "CHOLHDL" in the last 168 hours.  Hematology Recent Labs  Lab 07/24/22 1619 07/25/22 0510  WBC 5.8 5.5  RBC 4.20 3.91  HGB 10.2* 9.4*  HCT 35.4* 32.9*  MCV 84.3 84.1  MCH 24.3* 24.0*  MCHC 28.8* 28.6*  RDW 19.3* 19.2*  PLT 395 393    Thyroid No results for input(s): "TSH", "FREET4" in the last 168 hours.  BNP Recent Labs  Lab 07/24/22 1619  BNP 1,400.4*     DDimer No results for input(s): "DDIMER" in the last 168 hours.   Radiology    No results found.  Cardiac Studies   Echocardiogram was done yesterday and was reviewed by me.  It showed an EF of 25 to 30% with moderate pulmonary hypertension, small pericardial effusion, mild mitral regurgitation and moderate to severe tricuspid regurgitation  Patient Profile     37 y.o. female a history of HFrEF, nonischemic cardiomyopathy, hypertension, type 2 diabetes mellitus, stage II chronic kidney disease, GERD, microcytic anemia, obesity, medication nonadherence, and polysubstance abuse including cocaine, who presented with acute on chronic systolic heart failure  Assessment & Plan    1.  Acute on  chronic systolic heart failure with severely reduced LV systolic function.  Massive volume overload as she is about 10 kg above her dry weight.   Continue IV diuresis but I am concerned about decreased urine output and slight worsening of renal function.  I suspect she has significant RV dysfunction as well and might require inotropic therapy.   I recommend proceeding with a right heart catheterization tomorrow.  I discussed the procedure in details as well as risk and benefits.   Will continue IV diuresis but decrease the frequently to twice daily to avoid too fast diuresis as she had good urine output. Continue small dose spironolactone 12.5 mg once daily, furosemide 80 mg intravenously twice daily, [place] and Entresto.  2.  Polysubstance abuse including cocaine:She has been consistently positive for cocaine on urine drug screen.  As such, she is not a candidate for ICD placement.  3.  Medication nonadherence: This is a significant issue that has led to multiple hospitalizations.       For questions or updates, please contact [place] Please consult [email or web address] for contact info under        Signed, Kathlyn Sacramento, MD  07/27/2022, 2:03 PM

## 2022-07-28 ENCOUNTER — Other Ambulatory Visit: Payer: Self-pay

## 2022-07-28 ENCOUNTER — Encounter: Payer: Self-pay | Admitting: Cardiovascular Disease

## 2022-07-28 ENCOUNTER — Encounter
Admission: EM | Disposition: A | Discharge status: Home / Self Care | Payer: Self-pay | Source: Home / Self Care | Attending: Internal Medicine

## 2022-07-28 DIAGNOSIS — Z91148 Patient's other noncompliance with medication regimen for other reason: Secondary | ICD-10-CM | POA: Diagnosis not present

## 2022-07-28 DIAGNOSIS — I272 Pulmonary hypertension, unspecified: Secondary | ICD-10-CM | POA: Diagnosis not present

## 2022-07-28 DIAGNOSIS — I5043 Acute on chronic combined systolic (congestive) and diastolic (congestive) heart failure: Secondary | ICD-10-CM | POA: Diagnosis not present

## 2022-07-28 DIAGNOSIS — F1410 Cocaine abuse, uncomplicated: Secondary | ICD-10-CM | POA: Diagnosis not present

## 2022-07-28 HISTORY — PX: RIGHT HEART CATH: CATH118263

## 2022-07-28 LAB — BASIC METABOLIC PANEL
Anion gap: 12 (ref 5–15)
BUN: 29 mg/dL — ABNORMAL HIGH (ref 6–20)
CO2: 31 mmol/L (ref 22–32)
Calcium: 7.2 mg/dL — ABNORMAL LOW (ref 8.9–10.3)
Chloride: 92 mmol/L — ABNORMAL LOW (ref 98–111)
Creatinine, Ser: 1.14 mg/dL — ABNORMAL HIGH (ref 0.44–1.00)
GFR, Estimated: >60 mL/min (ref >60)
Glucose, Bld: 118 mg/dL — ABNORMAL HIGH (ref 70–99)
Potassium: 3.8 mmol/L (ref 3.5–5.1)
Sodium: 135 mmol/L (ref 135–145)

## 2022-07-28 LAB — POCT I-STAT EG7
Acid-Base Excess: 9 mmol/L — ABNORMAL HIGH (ref 0.0–2.0)
Bicarbonate: 34.5 mmol/L — ABNORMAL HIGH (ref 20.0–28.0)
Calcium, Ion: 1.01 mmol/L — ABNORMAL LOW (ref 1.15–1.40)
HCT: 31 % — ABNORMAL LOW (ref 36.0–46.0)
Hemoglobin: 10.5 g/dL — ABNORMAL LOW (ref 12.0–15.0)
O2 Saturation: 42 %
Potassium: 4.2 mmol/L (ref 3.5–5.1)
Sodium: 136 mmol/L (ref 135–145)
TCO2: 36 mmol/L — ABNORMAL HIGH (ref 22–32)
pCO2, Ven: 51.5 mmHg (ref 44–60)
pH, Ven: 7.434 — ABNORMAL HIGH (ref 7.25–7.43)
pO2, Ven: 24 mmHg — CL (ref 32–45)

## 2022-07-28 LAB — GLUCOSE, CAPILLARY
Glucose-Capillary: 113 mg/dL — ABNORMAL HIGH (ref 70–99)
Glucose-Capillary: 102 mg/dL — ABNORMAL HIGH (ref 70–99)
Glucose-Capillary: 84 mg/dL (ref 70–99)
Glucose-Capillary: 227 mg/dL — ABNORMAL HIGH (ref 70–99)
Glucose-Capillary: 261 mg/dL — ABNORMAL HIGH (ref 70–99)
Glucose-Capillary: 286 mg/dL — ABNORMAL HIGH (ref 70–99)

## 2022-07-28 LAB — COOXEMETRY PANEL
Carboxyhemoglobin: 1.2 % (ref 0.5–1.5)
Methemoglobin: <0.7 % (ref 0.0–1.5)
O2 Saturation: 44.5 %
Total hemoglobin: 9 g/dL — ABNORMAL LOW (ref 12.0–16.0)
Total oxygen content: 43.7 %

## 2022-07-28 LAB — MRSA NEXT GEN BY PCR, NASAL: MRSA by PCR Next Gen: NOT DETECTED

## 2022-07-28 SURGERY — RIGHT HEART CATH
Anesthesia: Moderate Sedation

## 2022-07-28 MED ORDER — MIDAZOLAM HCL 2 MG/2ML IJ SOLN
INTRAMUSCULAR | Status: AC
  Filled 2022-07-28: qty 2

## 2022-07-28 MED ORDER — SODIUM CHLORIDE 0.9% FLUSH
10.0000 mL | Freq: Two times a day (BID) | INTRAVENOUS | Status: DC
  Administered 2022-07-28 – 2022-08-04 (×12): 10 mL

## 2022-07-28 MED ORDER — FUROSEMIDE 10 MG/ML IJ SOLN
4.0000 mg/h | INTRAVENOUS | Status: DC
  Administered 2022-07-28 – 2022-07-30 (×3): 10 mg/h via INTRAVENOUS
  Administered 2022-07-30 – 2022-08-02 (×2): 4 mg/h via INTRAVENOUS
  Filled 2022-07-28 (×5): qty 20

## 2022-07-28 MED ORDER — SODIUM CHLORIDE 0.9% FLUSH: 10.0000 mL | INTRAVENOUS | Status: DC | PRN

## 2022-07-28 MED ORDER — SODIUM CHLORIDE 0.9% FLUSH
3.0000 mL | Freq: Two times a day (BID) | INTRAVENOUS | Status: DC
  Administered 2022-07-28 – 2022-08-04 (×10): 3 mL via INTRAVENOUS

## 2022-07-28 MED ORDER — HEPARIN (PORCINE) IN NACL 1000-0.9 UT/500ML-% IV SOLN
INTRAVENOUS | Status: AC
  Filled 2022-07-28: qty 1000

## 2022-07-28 MED ORDER — CHLORHEXIDINE GLUCONATE CLOTH 2 % EX PADS
6.0000 | MEDICATED_PAD | Freq: Every day | CUTANEOUS | Status: DC
  Administered 2022-07-28 – 2022-08-04 (×8): 6 via TOPICAL

## 2022-07-28 MED ORDER — HEPARIN (PORCINE) IN NACL 1000-0.9 UT/500ML-% IV SOLN
INTRAVENOUS | Status: DC | PRN
  Administered 2022-07-28: 500 mL

## 2022-07-28 MED ORDER — MILRINONE LACTATE IN DEXTROSE 20-5 MG/100ML-% IV SOLN
0.1250 ug/kg/min | INTRAVENOUS | Status: DC
  Administered 2022-07-28 – 2022-07-29 (×3): 0.25 ug/kg/min via INTRAVENOUS
  Administered 2022-07-30: 0.125 ug/kg/min via INTRAVENOUS
  Administered 2022-07-30: 0.25 ug/kg/min via INTRAVENOUS
  Filled 2022-07-28 (×6): qty 100

## 2022-07-28 MED ORDER — SODIUM CHLORIDE 0.9% FLUSH
3.0000 mL | INTRAVENOUS | Status: DC | PRN
  Administered 2022-07-29 (×2): 3 mL via INTRAVENOUS

## 2022-07-28 MED ORDER — MIDAZOLAM HCL 2 MG/2ML IJ SOLN
INTRAMUSCULAR | Status: DC | PRN
  Administered 2022-07-28: 1 mg via INTRAVENOUS

## 2022-07-28 MED ORDER — SODIUM CHLORIDE 0.9 % IV SOLN: 250.0000 mL | INTRAVENOUS | Status: DC | PRN

## 2022-07-28 MED ORDER — FENTANYL CITRATE (PF) 100 MCG/2ML IJ SOLN
INTRAMUSCULAR | Status: AC
  Filled 2022-07-28: qty 2

## 2022-07-28 SURGICAL SUPPLY — 7 items
CATH BALLN WEDGE 5F 110CM (CATHETERS) IMPLANT
DRAPE BRACHIAL (DRAPES) IMPLANT
GUIDEWIRE .025 260CM (WIRE) IMPLANT
KIT SYRINGE INJ CVI SPIKEX1 (MISCELLANEOUS) IMPLANT
PACK CARDIAC CATH (CUSTOM PROCEDURE TRAY) ×1 IMPLANT
SET ATX-X65L (MISCELLANEOUS) IMPLANT
SHEATH GLIDE SLENDER 4/5FR (SHEATH) IMPLANT

## 2022-07-28 NOTE — Progress Notes (Signed)
Milrinone started at [facility]. PICC line placed shortly before. Co-Ox sent by RT. Drawn from PICC. Previously ordered CVP and Co-ox not sent and not documented earlier d/t delay in PICC placement.

## 2022-07-28 NOTE — Progress Notes (Signed)
Rounding Note    Patient Name: Molly Howard Date of Encounter: 07/28/2022  [place] Cardiologist: [name], MD   Subjective   Still with significant shortness of breath and lower extremity edema.  Even though she is -4.3 L for the admission, her weight has increased. Right heart catheterization was performed this morning which showed severely elevated right and left-sided filling pressures, moderate pulmonary hypertension and moderately reduced cardiac output with severe RV dysfunction  Inpatient Medications    Scheduled Meds:  [MAR Hold] calcium carbonate  1 tablet Oral TID WC   [MAR Hold] carvedilol  3.125 mg Oral BID WC   [MAR Hold] dapagliflozin propanediol  10 mg Oral Daily   [MAR Hold] enoxaparin (LOVENOX) injection  0.5 mg/kg Subcutaneous Q24H   [MAR Hold] famotidine  20 mg Oral BID   [MAR Hold] ferrous sulfate  325 mg Oral Q breakfast   [MAR Hold] insulin aspart  0-5 Units Subcutaneous QHS   [MAR Hold] insulin aspart  0-9 Units Subcutaneous TID WC   [MAR Hold] insulin glargine-yfgn  15 Units Subcutaneous BID   [MAR Hold] sacubitril-valsartan  1 tablet Oral BID   [MAR Hold] sodium chloride flush  3 mL Intravenous Q12H   sodium chloride flush  3 mL Intravenous Q12H   [MAR Hold] spironolactone  12.5 mg Oral Daily   Continuous Infusions:  sodium chloride     furosemide (LASIX) 200 mg in dextrose 5 % 100 mL (2 mg/mL) infusion     milrinone     PRN Meds: sodium chloride, [MAR Hold] acetaminophen **OR** [MAR Hold] acetaminophen, [MAR Hold] albuterol, [MAR Hold] benzonatate, [MAR Hold] magnesium hydroxide, [MAR Hold] ondansetron **OR** [MAR Hold] ondansetron (ZOFRAN) IV, sodium chloride flush, [MAR Hold] traZODone   Vital Signs    Vitals:   07/28/22 1036 07/28/22 1045 07/28/22 1100 07/28/22 1115  BP: (!) 129/98 (!) 123/102 (!) 132/110   Pulse: 90 93 95   Resp: (!) 24 (!) 23 (!) 23 (!) 22  Temp:      TempSrc:      SpO2: 96% 99% 98%   Weight:       Height:        Intake/Output Summary (Last 24 hours) at 07/28/2022 1117 Last data filed at 07/28/2022 0507 Gross per 24 hour  Intake 700 ml  Output 1600 ml  Net -900 ml       07/28/2022    6:30 AM 07/27/2022    5:00 AM 07/26/2022    8:48 PM  Last 3 Weights  Weight (lbs) 253 lb 4.9 oz 253 lb 8 oz 253 lb 8 oz  Weight (kg) 114.9 kg 114.987 kg 114.987 kg      Telemetry    Sinus tachycardia- Personally Reviewed  ECG     - Personally Reviewed  Physical Exam   GEN: No acute distress.   Neck: Elevated JVD Cardiac: RRR, no murmurs, rubs, or gallops.  Moderate bilateral leg edema Respiratory: Diminished breath sounds at the base. GI: Soft, no tenderness.  The abdomen is distended. MS:  No deformity. Neuro:  Nonfocal  Psych: Normal affect   Labs    High Sensitivity Troponin:   Recent Labs  Lab 07/24/22 1729 07/24/22 1932  TROPONINIHS 13 14      Chemistry Recent Labs  Lab 07/24/22 1619 07/25/22 0510 07/26/22 0412 07/27/22 0620 07/28/22 0553 07/28/22 1013  NA 132*   < > 137 136 135 136  K 3.7   < > 4.6 4.1 3.8  4.2  CL 96*   < > 96* 99 92*  --   CO2 25   < > 31 31 31   --   GLUCOSE 244*   < > 102* 148* 118*  --   BUN 28*   < > 26* 30* 29*  --   CREATININE 1.23*   < > 1.11* 1.29* 1.14*  --   CALCIUM 6.0*   < > 7.3* 6.8* 7.2*  --   PROT 6.2*  --   --   --   --   --   ALBUMIN 1.7*  --   --   --   --   --   AST 19  --   --   --   --   --   ALT 13  --   --   --   --   --   ALKPHOS 144*  --   --   --   --   --   BILITOT 0.7  --   --   --   --   --   GFRNONAA 58*   < > >60 54* >60  --   ANIONGAP 11   < > 15 6 12   --    < > = values in this interval not displayed.     Lipids No results for input(s): "CHOL", "TRIG", "HDL", "LABVLDL", "LDLCALC", "CHOLHDL" in the last 168 hours.  Hematology Recent Labs  Lab 07/24/22 1619 07/25/22 0510 07/28/22 1013  WBC 5.8 5.5  --   RBC 4.20 3.91  --   HGB 10.2* 9.4* 10.5*  HCT 35.4* 32.9* 31.0*  MCV 84.3 84.1  --   MCH  24.3* 24.0*  --   MCHC 28.8* 28.6*  --   RDW 19.3* 19.2*  --   PLT 395 393  --     Thyroid No results for input(s): "TSH", "FREET4" in the last 168 hours.  BNP Recent Labs  Lab 07/24/22 1619  BNP 1,400.4*     DDimer No results for input(s): "DDIMER" in the last 168 hours.   Radiology    CARDIAC CATHETERIZATION  Result Date: 07/28/2022 Successful right heart catheterization via the right antecubital vein. Severely elevated right and left-sided filling pressures, moderate pulmonary hypertension and moderately reduced cardiac output. RA: 30 mmHg RV: 56/14 mmHg PW: 34 mmHg PA: 54/30 with a mean of 41 mmHg PA sat is 42% Cardiac output: 4.47 with a cardiac index of 2. CPO is 0.89 PAPI: 0.8 Recommendations: The patient is severely volume overloaded with evidence of biventricular failure right more than left by hemodynamics. Recommend starting milrinone drip and furosemide drip.  Will transfer to stepdown unit.    Cardiac Studies   Echocardiogram was done yesterday and was reviewed by me.  It showed an EF of 25 to 30% with moderate pulmonary hypertension, small pericardial effusion, mild mitral regurgitation and moderate to severe tricuspid regurgitation  Patient Profile     37 y.o. female a history of HFrEF, nonischemic cardiomyopathy, hypertension, type 2 diabetes mellitus, stage II chronic kidney disease, GERD, microcytic anemia, obesity, medication nonadherence, and polysubstance abuse including cocaine, who presented with acute on chronic systolic heart failure  Assessment & Plan    1.  Acute on chronic systolic heart failure with severely reduced LV systolic function.  Massive volume overload as she is about 10 kg above her dry weight.   Continue small dose spironolactone 12.5 mg once daily, [place] and Entresto. Right heart catheterization today showed severely  elevated filling pressures with moderately reduced cardiac output and severe right ventricular dysfunction. Recommend  transferring to stepdown ICU to start milrinone infusion and will switch furosemide to a drip as well. I requested a PICC line and will need to check CVP at least twice a day and mixed venous oxygen saturation daily. Given that cardiac output was not severely reduced and her blood pressure is elevated, I think it is reasonable for now to continue small dose carvedilol.  2.  Polysubstance abuse including cocaine:She has been consistently positive for cocaine on urine drug screen.  As such, she is not a candidate for ICD placement.  3.  Medication nonadherence: This is a significant issue that has led to multiple hospitalizations.   Total encounter time more than 50 minutes. Greater than 50% was spent in counseling and coordination of care with the patient.      For questions or updates, please contact [place] Please consult [email or web address] for contact info under        Signed, Kathlyn Sacramento, MD  07/28/2022, 11:17 AM

## 2022-07-28 NOTE — Progress Notes (Addendum)
Progress Note   Patient: Molly Howard [id number] DOB: [birth year] DOA: 07/24/2022     4 DOS: the patient was seen and examined on 07/28/2022   Brief hospital course: Molly Howard is a 37 y.o. female with medical history significant for combined systolic and diastolic CHF, stage II CKD, type diabetes mellitus, hypertension and nonischemic cardiomyopathy as well as for substance abuse, who presented to the emergency room with  worsening dyspnea with associated orthopnea, dyspnea on exertion paroxysmal nocturnal dyspnea over the last 3 days.  She admitted to bilateral lower extremity edema and 15 pound weight gain over the last week.  She has been having dry cough and wheezing.  No chest pain or palpitations.  No fever or chills.    ED course.  Vitals within normal range. Labs revealed mild hyponatremia 132 and hypochloremia 96, BUN of 28 and creatinine 1.23 close to baseline and calcium was 6 with albumin of 1.7,  BNP was 1400 and  High sensitive troponin was 13 and later 14.  CBC showed anemia close to baseline.  Blood glucose was 244. EKG:  Ordered but not done yet Imaging: Two-view chest x-ray showed cardiomegaly with pulmonary edema.   Patient admitted for IV diuresis.   3/24: Vital stable, labs with pseudohyponatremia secondary to hyperglycemia, potassium at 3.4, current pain improved to 1.09.  Hemoglobin at 9.4, CBG elevated at 218.  Patient has about 25 pound weight gain and significant edema involving lower extremity and flank.  Cardiology added metolazone.  Holding home Entresto and carvedilol due to borderline blood pressure.  Concern of being noncompliant.  UDS positive for cocaine.   3/25; hemodynamically stable, blood pressure started trending up so home Entresto was restarted.  UOP of 2 L, but weight apparently increased.  Most likely some error.  Renal function stable so we will continue diuresis.   3/26: Vital stable.  Slight worsening of creatinine as compared to yesterday but  remained within her baseline.  Will continue diuresis after discussing with cardiology.  She might be going for right heart catheterization tomorrow for better evaluation of her volume status. Weight and UOP recorded seems inaccurate.  3/27: RHC performed showing severely elevated right and left-sided filling pressures, moderate pulmonary hypertension and moderately reduced cardiac output with severe RV dysfunction. Transferred to ICU and started on lasix and milrinone gtts     Assessment and Plan: * Acute on chronic biventricular heart failure Patient admitted with dyspnea, orthopnea, 25 pound weight gain and significant edema involving lower extremities and flanks.  Most likely secondary to being noncompliant and continue to use cocaine. Echocardiogram done in 05/23 with a EF of 25 to 30% and grade 2 diastolic dysfunction.  Cardiology is on board. Out 2.2 liters last 24 Repeat echocardiogram with similar findings and a small circumferential pericardial effusion with no evidence of cardiac tamponade. RHC today showing elevated filling pressures - milrinone gtt - lasix gtt - coreg started - cont spiro, farxiga, entresto   Type II diabetes mellitus with renal manifestations (HCC) -Continue Semglee 15 units twice daily -Continue Farxiga -SSI  Hypocalcemia - ical 3.4. received calcium gluconate - will check mg, vit d, magnesium, phosphate, pth  Dyslipidemia - We will continue statin therapy.  Chronic kidney disease (CKD), stage II (mild) Creatinine currently at baseline. -Monitor renal function while she is being diuresed  GERD without esophagitis - We will continue H2 blocker therapy.  Cocaine abuse ([place]) UDS still positive for cocaine. Denies IVDU Counseling was provided. - check hiv,  hcv, hbv, rpr  Obesity (BMI 30-39.9) Estimated body mass index is 39.16 kg/m as calculated from the following:   Height as of this encounter: 5\' 7"  (1.702 m).   Weight as of this encounter:  [id number] kg.  -Complicates overall prognosis    Subjective: Patient was sitting comfortably in chair when seen today.  No new complaints. Breathing comfortably. No chest pain.  Physical Exam: Vitals:   07/28/22 1100 07/28/22 1115 07/28/22 1145 07/28/22 1200  BP: (!) 132/110  (!) 116/95 (!) 136/96  Pulse: 95  92 (!) 101  Resp: (!) 23 (!) 22  (!) 29  Temp:   98.7 F (37.1 C)   TempSrc:   Oral   SpO2: 98%  99% 97%  Weight:   116 kg   Height:   5\' 7"  (1.702 m)    General.   no acute distress. Pulmonary.  Lungs clear bilaterally, normal respiratory effort. CV.  Regular rate and rhythm, soft systolic murmur Abdomen.  Soft, nontender, nondistended, BS positive. CNS.  Alert and oriented .  No focal neurologic deficit. Extremities.  2+ LE edema, no cyanosis, pulses intact and symmetrical. Psychiatry.  Judgment and insight appears normal.   Data Reviewed: Prior data reviewed  Family Communication: none @ bedside  Disposition: Status is: Inpatient Remains inpatient appropriate because: Severity of illness  Planned Discharge Destination: Home, will need PT eval     Time spent: 35 min  Author: Desma Maxim, MD 07/28/2022 1:07 PM  For on call review [email or web address].

## 2022-07-28 NOTE — Progress Notes (Signed)
Peripherally Inserted Central Catheter Placement  The IV Nurse has discussed with the patient and/or persons authorized to consent for the patient, the purpose of this procedure and the potential benefits and risks involved with this procedure.  The benefits include less needle sticks, lab draws from the catheter, and the patient may be discharged home with the catheter. Risks include, but not limited to, infection, bleeding, blood clot (thrombus formation), and puncture of an artery; nerve damage and irregular heartbeat and possibility to perform a PICC exchange if needed/ordered by physician.  Alternatives to this procedure were also discussed.  B[facility] Power PICC patient education guide, fact sheet on infection prevention and patient information card has been provided to patient /or left at bedside.  PICC inserted by Quin Hoop, RN  PICC Placement Documentation  PICC Triple Lumen 07/28/22 Left Brachial 41 cm 0 cm (Active)  Indication for Insertion or Continuance of Line Chronic illness with exacerbations (CF, Sickle Cell, etc.);Vasoactive infusions 07/28/22 1818  Exposed Catheter (cm) 0 cm 07/28/22 1818  Site Assessment Clean, Dry, Intact 07/28/22 1818  Lumen #1 Status Flushed;Saline locked;Blood return noted 07/28/22 1818  Lumen #2 Status Flushed;Saline locked;Blood return noted 07/28/22 1818  Lumen #3 Status Flushed;Saline locked;Blood return noted 07/28/22 1818  Dressing Type Transparent;Securing device 07/28/22 1818  Dressing Status Antimicrobial disc in place;Clean, Dry, Intact 07/28/22 1818  Safety Lock Not Applicable [id number]  Line Care Connections checked and tightened 07/28/22 1818  Dressing Intervention New dressing 07/28/22 1818  Dressing Change Due 08/04/22 07/28/22 Williamson, Nicolette Bang 07/28/2022, 6:19 PM

## 2022-07-28 NOTE — Progress Notes (Addendum)
Patient arrived to unit from Cath lab with Molly Howard and Molly Howard. Vitals WDL. Purewick in place s/t lasix gtt. Holding milrinone for PICC placement per MD Arida. Patient provided with lunch. All personal belongings at bedside, brought from 2A. Patient confirms all belongings at bedside. All needs met at this time. Will continue to monitor closely.

## 2022-07-29 DIAGNOSIS — F1410 Cocaine abuse, uncomplicated: Secondary | ICD-10-CM | POA: Diagnosis not present

## 2022-07-29 DIAGNOSIS — E1129 Type 2 diabetes mellitus with other diabetic kidney complication: Secondary | ICD-10-CM | POA: Diagnosis not present

## 2022-07-29 DIAGNOSIS — N182 Chronic kidney disease, stage 2 (mild): Secondary | ICD-10-CM | POA: Diagnosis not present

## 2022-07-29 DIAGNOSIS — I5043 Acute on chronic combined systolic (congestive) and diastolic (congestive) heart failure: Secondary | ICD-10-CM | POA: Diagnosis not present

## 2022-07-29 LAB — BASIC METABOLIC PANEL
Anion gap: 7 (ref 5–15)
Anion gap: 10 (ref 5–15)
BUN: 28 mg/dL — ABNORMAL HIGH (ref 6–20)
BUN: 28 mg/dL — ABNORMAL HIGH (ref 6–20)
CO2: 31 mmol/L (ref 22–32)
CO2: 31 mmol/L (ref 22–32)
Calcium: 6.9 mg/dL — ABNORMAL LOW (ref 8.9–10.3)
Calcium: 7.2 mg/dL — ABNORMAL LOW (ref 8.9–10.3)
Chloride: 97 mmol/L — ABNORMAL LOW (ref 98–111)
Chloride: 91 mmol/L — ABNORMAL LOW (ref 98–111)
Creatinine, Ser: 1.11 mg/dL — ABNORMAL HIGH (ref 0.44–1.00)
Creatinine, Ser: 1.23 mg/dL — ABNORMAL HIGH (ref 0.44–1.00)
GFR, Estimated: >60 mL/min (ref >60)
GFR, Estimated: 58 mL/min — ABNORMAL LOW (ref >60)
Glucose, Bld: 229 mg/dL — ABNORMAL HIGH (ref 70–99)
Glucose, Bld: 313 mg/dL — ABNORMAL HIGH (ref 70–99)
Potassium: 3.9 mmol/L (ref 3.5–5.1)
Potassium: 3.5 mmol/L (ref 3.5–5.1)
Sodium: 135 mmol/L (ref 135–145)
Sodium: 132 mmol/L — ABNORMAL LOW (ref 135–145)

## 2022-07-29 LAB — CBC
HCT: 30.6 % — ABNORMAL LOW (ref 36.0–46.0)
Hemoglobin: 8.7 g/dL — ABNORMAL LOW (ref 12.0–15.0)
MCH: 23.9 pg — ABNORMAL LOW (ref 26.0–34.0)
MCHC: 28.4 g/dL — ABNORMAL LOW (ref 30.0–36.0)
MCV: 84.1 fL (ref 80.0–100.0)
Platelets: 356 10*3/uL (ref 150–400)
RBC: 3.64 MIL/uL — ABNORMAL LOW (ref 3.87–5.11)
RDW: 19.4 % — ABNORMAL HIGH (ref 11.5–15.5)
WBC: 5 10*3/uL (ref 4.0–10.5)
nRBC: 0 % (ref 0.0–0.2)

## 2022-07-29 LAB — GLUCOSE, CAPILLARY
Glucose-Capillary: 205 mg/dL — ABNORMAL HIGH (ref 70–99)
Glucose-Capillary: 244 mg/dL — ABNORMAL HIGH (ref 70–99)
Glucose-Capillary: 164 mg/dL — ABNORMAL HIGH (ref 70–99)
Glucose-Capillary: 196 mg/dL — ABNORMAL HIGH (ref 70–99)

## 2022-07-29 LAB — MAGNESIUM: Magnesium: 1.6 mg/dL — ABNORMAL LOW (ref 1.7–2.4)

## 2022-07-29 LAB — HIV ANTIBODY (ROUTINE TESTING W REFLEX): HIV Screen 4th Generation wRfx: NONREACTIVE

## 2022-07-29 LAB — HEPATITIS B SURFACE ANTIGEN: Hepatitis B Surface Ag: NONREACTIVE

## 2022-07-29 LAB — COOXEMETRY PANEL
Carboxyhemoglobin: 2.5 % — ABNORMAL HIGH (ref 0.5–1.5)
Methemoglobin: <0.7 % (ref 0.0–1.5)
O2 Saturation: 87.2 %
Total hemoglobin: 9 g/dL — ABNORMAL LOW (ref 12.0–16.0)
Total oxygen content: 84.9 %

## 2022-07-29 LAB — RPR: RPR Ser Ql: NONREACTIVE

## 2022-07-29 LAB — VITAMIN D 25 HYDROXY (VIT D DEFICIENCY, FRACTURES): Vit D, 25-Hydroxy: 9.64 ng/mL — ABNORMAL LOW (ref 30–100)

## 2022-07-29 LAB — PHOSPHORUS: Phosphorus: 4.3 mg/dL (ref 2.5–4.6)

## 2022-07-29 MED ORDER — SACUBITRIL-VALSARTAN 49-51 MG PO TABS
1.0000 | ORAL_TABLET | Freq: Two times a day (BID) | ORAL | Status: DC
  Administered 2022-07-29 – 2022-08-04 (×12): 1 via ORAL
  Filled 2022-07-29 (×12): qty 1

## 2022-07-29 MED ORDER — METOLAZONE 2.5 MG PO TABS
2.5000 mg | ORAL_TABLET | Freq: Once | ORAL | Status: AC
  Administered 2022-07-29: 2.5 mg via ORAL
  Filled 2022-07-29: qty 1

## 2022-07-29 MED ORDER — MAGNESIUM OXIDE -MG SUPPLEMENT 400 (240 MG) MG PO TABS
800.0000 mg | ORAL_TABLET | Freq: Every day | ORAL | Status: DC
  Administered 2022-07-29: 800 mg via ORAL
  Filled 2022-07-29: qty 2

## 2022-07-29 MED ORDER — VITAMIN D (ERGOCALCIFEROL) 1.25 MG (50000 UNIT) PO CAPS
50000.0000 [IU] | ORAL_CAPSULE | ORAL | Status: DC
  Administered 2022-07-29: 50000 [IU] via ORAL
  Filled 2022-07-29: qty 1

## 2022-07-29 MED ORDER — POTASSIUM CHLORIDE CRYS ER 20 MEQ PO TBCR
40.0000 meq | EXTENDED_RELEASE_TABLET | Freq: Once | ORAL | Status: AC
  Administered 2022-07-29: 40 meq via ORAL
  Filled 2022-07-29: qty 2

## 2022-07-29 MED ORDER — INSULIN GLARGINE-YFGN 100 UNIT/ML ~~LOC~~ SOLN
18.0000 [IU] | Freq: Two times a day (BID) | SUBCUTANEOUS | Status: DC
  Administered 2022-07-29 – 2022-07-31 (×4): 18 [IU] via SUBCUTANEOUS
  Filled 2022-07-29 (×5): qty 0.18

## 2022-07-29 MED ORDER — SPIRONOLACTONE 25 MG PO TABS
25.0000 mg | ORAL_TABLET | Freq: Every day | ORAL | Status: DC
  Administered 2022-07-30 – 2022-08-04 (×6): 25 mg via ORAL
  Filled 2022-07-29 (×6): qty 1

## 2022-07-29 NOTE — Progress Notes (Signed)
Progress Note   Patient: Molly Howard [id number] DOB: March 10, 1986 DOA: 07/24/2022     5 DOS: the patient was seen and examined on 07/29/2022   Brief hospital course: Molly Howard is a 37 y.o. female with medical history significant for combined systolic and diastolic CHF, stage II CKD, type diabetes mellitus, hypertension and nonischemic cardiomyopathy as well as for substance abuse, who presented to the emergency room with  worsening dyspnea with associated orthopnea, dyspnea on exertion paroxysmal nocturnal dyspnea over the last 3 days.  She admitted to bilateral lower extremity edema and 15 pound weight gain over the last week.  She has been having dry cough and wheezing.  No chest pain or palpitations.  No fever or chills.    ED course.  Vitals within normal range. Labs revealed mild hyponatremia 132 and hypochloremia 96, BUN of 28 and creatinine 1.23 close to baseline and calcium was 6 with albumin of 1.7,  BNP was 1400 and  High sensitive troponin was 13 and later 14.  CBC showed anemia close to baseline.  Blood glucose was 244. EKG:  Ordered but not done yet Imaging: Two-view chest x-ray showed cardiomegaly with pulmonary edema.   Patient admitted for IV diuresis.   3/24: Vital stable, labs with pseudohyponatremia secondary to hyperglycemia, potassium at 3.4, current pain improved to 1.09.  Hemoglobin at 9.4, CBG elevated at 218.  Patient has about 25 pound weight gain and significant edema involving lower extremity and flank.  Cardiology added metolazone.  Holding home Entresto and carvedilol due to borderline blood pressure.  Concern of being noncompliant.  UDS positive for cocaine.   3/25; hemodynamically stable, blood pressure started trending up so home Entresto was restarted.  UOP of 2 L, but weight apparently increased.  Most likely some error.  Renal function stable so we will continue diuresis.   3/26: Vital stable.  Slight worsening of creatinine as compared to yesterday but  remained within her baseline.  Will continue diuresis after discussing with cardiology.  She might be going for right heart catheterization tomorrow for better evaluation of her volume status. Weight and UOP recorded seems inaccurate.  3/27: RHC performed showing severely elevated right and left-sided filling pressures, moderate pulmonary hypertension and moderately reduced cardiac output with severe RV dysfunction. Transferred to ICU and started on lasix and milrinone gtts     Assessment and Plan: * Acute on chronic biventricular heart failure Patient admitted with dyspnea, orthopnea, 25 pound weight gain and significant edema involving lower extremities and flanks.  Most likely secondary to being noncompliant and continue to use cocaine. Echocardiogram done in 05/23 with a EF of 25 to 30% and grade 2 diastolic dysfunction.  Cardiology is on board. Out over 6 liters today with lasix infusion Repeat echocardiogram with similar findings and a small circumferential pericardial effusion with no evidence of cardiac tamponade. [place] 3/27 showing elevated filling pressures - milrinone gtt - lasix gtt - coreg started - cont spiro (dose increased today), farxiga, entresto (dose increased today)   Type II diabetes mellitus with renal manifestations (HCC) Glucose elevated today -increase semglee to 18 bid - switch to carb modified diet -Continue Farxiga -SSI  Hypocalcemia Hypomagnesemia Vitamin d low - ical 3.4. received calcium gluconate - mg is low as is vitamin d - will start mag and vit d supplementation - f/u pth  Dyslipidemia - We will continue statin therapy.  Chronic kidney disease (CKD), stage II (mild) Creatinine currently at baseline. -Monitor renal function while she  is being diuresed  GERD without esophagitis - We will continue H2 blocker therapy.  Cocaine abuse ([place]) UDS still positive for cocaine. Denies IVDU Counseling was provided. Hiv, hbv, rpr neg - f/u  hcv  Obesity (BMI 30-39.9) Estimated body mass index is 39.16 kg/m as calculated from the following:   Height as of this encounter: 5\' 7"  (1.702 m).   Weight as of this encounter: [id number] kg.  -Complicates overall prognosis    Subjective: Patient was sitting comfortably in chair when seen today.  No new complaints. Breathing comfortably. No chest pain. Tolerating diet.  Physical Exam: Vitals:   07/29/22 1300 07/29/22 1500 07/29/22 1600 07/29/22 1700  BP: (!) 140/124 (!) 157/96 (!) 158/96 (!) 170/95  Pulse: 96 95 98 (!) 101  Resp: (!) 23 19 (!) 31 13  Temp:   97.9 F (36.6 C)   TempSrc:   Oral   SpO2: 97% 100% 94% 93%  Weight:      Height:       General.   no acute distress. Pulmonary.  Lungs clear bilaterally, normal respiratory effort. CV.  Regular rate and rhythm, soft systolic murmur Abdomen.  Soft, nontender, nondistended, BS positive. CNS.  Alert and oriented .  No focal neurologic deficit. Extremities.  2+ LE edema, no cyanosis, pulses intact and symmetrical. Psychiatry.  Judgment and insight appears normal.   Data Reviewed: Prior data reviewed  Family Communication: none @ bedside  Disposition: Status is: Inpatient Remains inpatient appropriate because: Severity of illness  Planned Discharge Destination: Home    Time spent: 35 min  Author: Desma Maxim, MD 07/29/2022 5:34 PM  For on call review [email or web address].

## 2022-07-29 NOTE — Progress Notes (Signed)
Rounding Note    Patient Name: Molly Howard Date of Encounter: 07/29/2022  [facility] Cardiologist: [name], MD   Subjective   Right heart catheterization 3/27 showed severely elevated right and left-sided filling pressures, moderate pulmonary hypertension and moderately reduced cardiac output with severe RV dysfunction.  Dyspnea persists, improving. Continues to have significant edema. Documented UOP 480 mL for the past 24 hours, though RN reports significant UOP with bed wetting overnight. UOP 4.6 L for the admission. Remains on milrinone and Lasix gtts. Coox 87 this morning. CVP 17. Renal function stable.   Inpatient Medications    Scheduled Meds:  calcium carbonate  1 tablet Oral TID WC   carvedilol  3.125 mg Oral BID WC   Chlorhexidine Gluconate Cloth  6 each Topical Daily   dapagliflozin propanediol  10 mg Oral Daily   enoxaparin (LOVENOX) injection  0.5 mg/kg Subcutaneous Q24H   famotidine  20 mg Oral BID   ferrous sulfate  325 mg Oral Q breakfast   insulin aspart  0-5 Units Subcutaneous QHS   insulin aspart  0-9 Units Subcutaneous TID WC   insulin glargine-yfgn  15 Units Subcutaneous BID   sacubitril-valsartan  1 tablet Oral BID   sodium chloride flush  10-40 mL Intracatheter Q12H   sodium chloride flush  3 mL Intravenous Q12H   sodium chloride flush  3 mL Intravenous Q12H   spironolactone  12.5 mg Oral Daily   Continuous Infusions:  sodium chloride     furosemide (LASIX) 200 mg in dextrose 5 % 100 mL (2 mg/mL) infusion 10 mg/hr (07/29/22 0728)   milrinone 0.25 mcg/kg/min (07/29/22 0728)   PRN Meds: sodium chloride, acetaminophen **OR** acetaminophen, albuterol, benzonatate, magnesium hydroxide, ondansetron **OR** ondansetron (ZOFRAN) IV, sodium chloride flush, sodium chloride flush, traZODone   Vital Signs    Vitals:   07/29/22 0500 07/29/22 0600 07/29/22 0701 07/29/22 0800  BP: 139/67 (!) 152/87 (!) 143/89 (!) 151/103  Pulse: 96 94 98  98  Resp: 20 (!) 28 (!) 24 20  Temp:   97.6 F (36.4 C)   TempSrc:   Oral   SpO2: 100% 97% 99% 100%  Weight: 113.6 kg     Height:        Intake/Output Summary (Last 24 hours) at 07/29/2022 1028 Last data filed at 07/29/2022 [id number] Gross per 24 hour  Intake 597.84 ml  Output 975 ml  Net -377.16 ml       07/29/2022    5:00 AM 07/28/2022   11:45 AM 07/28/2022    6:30 AM  Last 3 Weights  Weight (lbs) 250 lb 7.1 oz 255 lb 11.7 oz 253 lb 4.9 oz  Weight (kg) 113.6 kg 116 kg 114.9 kg      Telemetry    Sinus rhythm with PVCs, ventricular triplets and NSVT lasting up to 6 beats - Personally Reviewed  ECG    No new tracings - Personally Reviewed  Physical Exam   GEN: No acute distress.   Neck: Elevated JVD. Cardiac: RRR, no murmurs, rubs, or gallops.  Moderate bilateral leg edema extending to the flank. Respiratory: Diminished breath sounds at the base. GI: Soft, no tenderness.  The abdomen is distended. MS:  No deformity. Neuro:  Nonfocal  Psych: Normal affect   Labs    High Sensitivity Troponin:   Recent Labs  Lab 07/24/22 1729 07/24/22 1932  TROPONINIHS 13 14      Chemistry Recent Labs  Lab 07/24/22 1619 07/25/22 0510 07/27/22 [id number]  07/28/22 0553 07/28/22 1013 07/29/22 0417  NA 132*   < > 136 135 136 135  K 3.7   < > 4.1 3.8 4.2 3.9  CL 96*   < > 99 92*  --  97*  CO2 25   < > 31 31  --  31  GLUCOSE 244*   < > 148* 118*  --  229*  BUN 28*   < > 30* 29*  --  28*  CREATININE 1.23*   < > 1.29* 1.14*  --  1.11*  CALCIUM 6.0*   < > 6.8* 7.2*  --  6.9*  MG  --   --   --   --   --  1.6*  PROT 6.2*  --   --   --   --   --   ALBUMIN 1.7*  --   --   --   --   --   AST 19  --   --   --   --   --   ALT 13  --   --   --   --   --   ALKPHOS 144*  --   --   --   --   --   BILITOT 0.7  --   --   --   --   --   GFRNONAA 58*   < > 54* >60  --  >60  ANIONGAP 11   < > 6 12  --  7   < > = values in this interval not displayed.     Lipids No results for input(s):  "CHOL", "TRIG", "HDL", "LABVLDL", "LDLCALC", "CHOLHDL" in the last 168 hours.  Hematology Recent Labs  Lab 07/24/22 1619 07/25/22 0510 07/28/22 1013 07/29/22 0417  WBC 5.8 5.5  --  5.0  RBC 4.20 3.91  --  3.64*  HGB 10.2* 9.4* 10.5* 8.7*  HCT 35.4* 32.9* 31.0* 30.6*  MCV 84.3 84.1  --  84.1  MCH 24.3* 24.0*  --  23.9*  MCHC 28.8* 28.6*  --  28.4*  RDW 19.3* 19.2*  --  19.4*  PLT 395 393  --  356    Thyroid No results for input(s): "TSH", "FREET4" in the last 168 hours.  BNP Recent Labs  Lab 07/24/22 1619  BNP 1,400.4*     DDimer No results for input(s): "DDIMER" in the last 168 hours.   Radiology      Cardiac Studies   RHC 07/28/2022: Successful right heart catheterization via the right antecubital vein. Severely elevated right and left-sided filling pressures, moderate pulmonary hypertension and moderately reduced cardiac output.   RA: 30 mmHg RV: 56/14 mmHg PW: 34 mmHg PA: 54/30 with a mean of 41 mmHg PA sat is 42% Cardiac output: 4.47 with a cardiac index of 2. CPO is 0.89 PAPI: 0.8   Recommendations: The patient is severely volume overloaded with evidence of biventricular failure right more than left by hemodynamics. Recommend starting milrinone drip and furosemide drip.  Will transfer to stepdown unit. __________  2D echo 07/25/2022: 1. Left ventricular ejection fraction, by estimation, is 25 to 30%. Left  ventricular ejection fraction by 2D MOD biplane is 21.6 %. The left  ventricle has severely decreased function. The left ventricle demonstrates  global hypokinesis. Left ventricular  diastolic parameters are indeterminate.   2. Right ventricular systolic function is mildly reduced. The right  ventricular size is normal. There is moderately elevated pulmonary artery  systolic pressure. The estimated right  ventricular systolic pressure is  [id number] mmHg.   3. Left atrial size was mildly dilated.   4. A small pericardial effusion is present. The  pericardial effusion is  circumferential. There is no evidence of cardiac tamponade.   5. The aortic valve is tricuspid. Aortic valve regurgitation is not  visualized. No aortic stenosis is present.   6. The mitral valve is normal in structure. Mild mitral valve  regurgitation. No evidence of mitral stenosis.   7. Tricuspid valve regurgitation is moderate to severe.   Patient Profile     37 y.o. female a history of HFrEF, nonischemic cardiomyopathy, hypertension, type 2 diabetes mellitus, stage II chronic kidney disease, GERD, microcytic anemia, obesity, medication nonadherence, and polysubstance abuse including cocaine, who presented with acute on chronic systolic heart failure  Assessment & Plan    1.  Acute on chronic systolic heart failure with severely reduced LV systolic function due to NICM: -Massive volume overload as she is about 10 kg above her dry weight.   -[place] 3/27 showed severely elevated filling pressures with moderately reduced cardiac output and severe right ventricular dysfunction. -Continue IV Lasix with augmentation with milrinone. -Continue small dose spironolactone 12.5 mg once daily, Farxiga, Coreg, and Imogene. -Monitor CVP and Coox. -Given that cardiac output was not severely reduced and her blood pressure is elevated, it is reasonable for now to continue small dose carvedilol. -Follow up with advanced heart failure team  2.  Polysubstance abuse including cocaine: -She has been consistently positive for cocaine on urine drug screen.   -As such, she is not a candidate for ICD placement, or advanced/invasive heart failure therapies. -Cessation is recommended.  3.  Medication nonadherence:  -This is a significant issue that has led to multiple hospitalizations.      For questions or updates, please contact [place] Please consult [email or web address] for contact info under        Signed, Christell Faith, PA-C  07/29/2022, 10:28 AM

## 2022-07-29 NOTE — Progress Notes (Signed)
Noted patient getting OOB to [facility] and removing monitors, leads, equipment about every 20-30 min to use BSC. Patient continues to get OOB without help or using call light to ask for help despite multiple shifts and multiple staff educating patient on how this is unsafe. Patient refuses a purwick stating that she "doesn't like them and they don't work.". Patient became very agitated when staff explained that this behavior was unsafe and could not continue. Not only was patient high risk for fall, but she was a high risk for pulling out CVC line, and was going about 5-10 minutes unmonitored with a lasix and milrinone drip running.  Patient became very upset stating that she was NOT using a purwick and none of her providers told her that she couldn't get up on her own. Staff informed patient that provider would be notified at this time. Patient continued to get OOB on own, at this time, removing all EKG, pulse ox, NIBP cuff and cvp cord for monitoring. Patient is alert and oriented and aware of behavior.   Notified both NP Hassan Rowan and Agricultural engineer. Provider ordered Air cabin crew.   Went back to bedside when provider came to speak with patient. Patient became upset calling staff "a liar" when staff explained that the concern was patient get OOB by herself and removing monitors/equipment. Patient told nurse to "get out of her room."  Staff left room at this time and Tech, Freda Munro went in to sit with patient.

## 2022-07-29 NOTE — Progress Notes (Signed)
       CROSS COVER NOTE  NAME: JAYDEE DACHEL MRN: [id number] DOB : 1986/01/30    HPI/Events of Note   Report:received message from nurse via chat "patient is continuing to remove all leads, lines, purwick and getting self OOB to [facility] about every 20 min ALL day and night. She is on 2 gtts. Milrinone lasix. Will NOT ask for help. Climbs over rails. Has almost pulled out PICC line multiple times last night and today and she said none of her providers said she couldn't do it. She is going to get hurt. She stays unmonitored for 10 min at least each time."   On review of chart:Patient with biventricular heart failure, on IV lasix and milrinone with continuous cardiac and cvp monitoring.  Cocaine abuse ongoing per notes. Day 5-6 of hospitalization in ICU    Assessment and  Interventions   Assessment: Cannot rule out delirium as etiology of impulsive behavior Plan: Safety sitter ordered       Kathlene Cote NP [facility]

## 2022-07-30 DIAGNOSIS — I509 Heart failure, unspecified: Secondary | ICD-10-CM

## 2022-07-30 DIAGNOSIS — E1129 Type 2 diabetes mellitus with other diabetic kidney complication: Secondary | ICD-10-CM | POA: Diagnosis not present

## 2022-07-30 DIAGNOSIS — I428 Other cardiomyopathies: Secondary | ICD-10-CM | POA: Diagnosis not present

## 2022-07-30 DIAGNOSIS — I5043 Acute on chronic combined systolic (congestive) and diastolic (congestive) heart failure: Secondary | ICD-10-CM | POA: Diagnosis not present

## 2022-07-30 DIAGNOSIS — N182 Chronic kidney disease, stage 2 (mild): Secondary | ICD-10-CM | POA: Diagnosis not present

## 2022-07-30 LAB — BASIC METABOLIC PANEL
Anion gap: 9 (ref 5–15)
Anion gap: 13 (ref 5–15)
BUN: 28 mg/dL — ABNORMAL HIGH (ref 6–20)
BUN: 27 mg/dL — ABNORMAL HIGH (ref 6–20)
CO2: 33 mmol/L — ABNORMAL HIGH (ref 22–32)
CO2: 33 mmol/L — ABNORMAL HIGH (ref 22–32)
Calcium: 7.7 mg/dL — ABNORMAL LOW (ref 8.9–10.3)
Calcium: 8.1 mg/dL — ABNORMAL LOW (ref 8.9–10.3)
Chloride: 94 mmol/L — ABNORMAL LOW (ref 98–111)
Chloride: 87 mmol/L — ABNORMAL LOW (ref 98–111)
Creatinine, Ser: 1.21 mg/dL — ABNORMAL HIGH (ref 0.44–1.00)
Creatinine, Ser: 1.13 mg/dL — ABNORMAL HIGH (ref 0.44–1.00)
GFR, Estimated: 59 mL/min — ABNORMAL LOW (ref >60)
GFR, Estimated: >60 mL/min (ref >60)
Glucose, Bld: 214 mg/dL — ABNORMAL HIGH (ref 70–99)
Glucose, Bld: 307 mg/dL — ABNORMAL HIGH (ref 70–99)
Potassium: 3.9 mmol/L (ref 3.5–5.1)
Potassium: 3.9 mmol/L (ref 3.5–5.1)
Sodium: 136 mmol/L (ref 135–145)
Sodium: 133 mmol/L — ABNORMAL LOW (ref 135–145)

## 2022-07-30 LAB — COOXEMETRY PANEL
Carboxyhemoglobin: 2.3 % — ABNORMAL HIGH (ref 0.5–1.5)
Methemoglobin: <0.7 % (ref 0.0–1.5)
O2 Saturation: 99.4 %
Total hemoglobin: 10.3 g/dL — ABNORMAL LOW (ref 12.0–16.0)
Total oxygen content: 96.6 %

## 2022-07-30 LAB — HCV INTERPRETATION

## 2022-07-30 LAB — MAGNESIUM: Magnesium: 1.6 mg/dL — ABNORMAL LOW (ref 1.7–2.4)

## 2022-07-30 LAB — GLUCOSE, CAPILLARY
Glucose-Capillary: 134 mg/dL — ABNORMAL HIGH (ref 70–99)
Glucose-Capillary: 221 mg/dL — ABNORMAL HIGH (ref 70–99)
Glucose-Capillary: 196 mg/dL — ABNORMAL HIGH (ref 70–99)
Glucose-Capillary: 297 mg/dL — ABNORMAL HIGH (ref 70–99)

## 2022-07-30 LAB — PARATHYROID HORMONE, INTACT (NO CA): PTH: 76 pg/mL — ABNORMAL HIGH (ref 15–65)

## 2022-07-30 LAB — HCV AB W REFLEX TO QUANT PCR: HCV Ab: NONREACTIVE

## 2022-07-30 MED ORDER — ISOSORB DINITRATE-HYDRALAZINE 20-37.5 MG PO TABS
0.5000 | ORAL_TABLET | Freq: Three times a day (TID) | ORAL | Status: DC
  Administered 2022-07-30 – 2022-07-31 (×3): 0.5 via ORAL
  Filled 2022-07-30 (×3): qty 0.5

## 2022-07-30 MED ORDER — MAGNESIUM SULFATE 4 GM/100ML IV SOLN
4.0000 g | Freq: Once | INTRAVENOUS | Status: AC
  Administered 2022-07-30: 4 g via INTRAVENOUS
  Filled 2022-07-30: qty 100

## 2022-07-30 NOTE — Progress Notes (Signed)
Progress Note   Patient: Molly Howard [id number] DOB: 04-01-1986 DOA: 07/24/2022     6 DOS: the patient was seen and examined on 07/30/2022   Brief hospital course: Molly Howard is a 37 y.o. female with medical history significant for combined systolic and diastolic CHF, stage II CKD, type diabetes mellitus, hypertension and nonischemic cardiomyopathy as well as for substance abuse, who presented to the emergency room with  worsening dyspnea with associated orthopnea, dyspnea on exertion paroxysmal nocturnal dyspnea over the last 3 days.  She admitted to bilateral lower extremity edema and 15 pound weight gain over the last week.  She has been having dry cough and wheezing.  No chest pain or palpitations.  No fever or chills.    ED course.  Vitals within normal range. Labs revealed mild hyponatremia 132 and hypochloremia 96, BUN of 28 and creatinine 1.23 close to baseline and calcium was 6 with albumin of 1.7,  BNP was 1400 and  High sensitive troponin was 13 and later 14.  CBC showed anemia close to baseline.  Blood glucose was 244. EKG:  Ordered but not done yet Imaging: Two-view chest x-ray showed cardiomegaly with pulmonary edema.   Patient admitted for IV diuresis.   3/24: Vital stable, labs with pseudohyponatremia secondary to hyperglycemia, potassium at 3.4, current pain improved to 1.09.  Hemoglobin at 9.4, CBG elevated at 218.  Patient has about 25 pound weight gain and significant edema involving lower extremity and flank.  Cardiology added metolazone.  Holding home Entresto and carvedilol due to borderline blood pressure.  Concern of being noncompliant.  UDS positive for cocaine.   3/25; hemodynamically stable, blood pressure started trending up so home Entresto was restarted.  UOP of 2 L, but weight apparently increased.  Most likely some error.  Renal function stable so we will continue diuresis.   3/26: Vital stable.  Slight worsening of creatinine as compared to yesterday but  remained within her baseline.  Will continue diuresis after discussing with cardiology.  She might be going for right heart catheterization tomorrow for better evaluation of her volume status. Weight and UOP recorded seems inaccurate.  3/27: RHC performed showing severely elevated right and left-sided filling pressures, moderate pulmonary hypertension and moderately reduced cardiac output with severe RV dysfunction. Transferred to ICU and started on lasix and milrinone gtts  3/29: continuing IV diuresis     Assessment and Plan: * Acute on chronic biventricular heart failure Patient admitted with dyspnea, orthopnea, 25 pound weight gain and significant edema involving lower extremities and flanks.  Most likely secondary to being noncompliant and continue to use cocaine. Echocardiogram done in 05/23 with a EF of 25 to 30% and grade 2 diastolic dysfunction.  Cardiology is on board. Out over 11 liters yesterday Repeat echocardiogram with similar findings and a small circumferential pericardial effusion with no evidence of cardiac tamponade. Molly Howard 3/27 showing elevated filling pressures - milrinone gtt, dose reduced - lasix gtt, dose reduced - coreg started - cont spiro, farxiga, entresto    Type II diabetes mellitus with renal manifestations (HCC) Glucose mildly elevated today -cont semglee 18 bid - cont carb modified diet -Continue Farxiga -SSI  Hypocalcemia Hypomagnesemia Vitamin d low ical 3.4. received calcium gluconate. mg is low as is vitamin d. Pth is elevated, consistent with these as cause or at least contributors of hypocalcemia - have started mag and vit d supplementation  Dyslipidemia - We will continue statin therapy.  Chronic kidney disease (CKD), stage II (mild)  Creatinine currently at baseline. -Monitor renal function while she is being diuresed  GERD without esophagitis - We will continue H2 blocker therapy.  Cocaine abuse ([place]) UDS still positive for cocaine.  Denies IVDU Counseling was provided. Hiv, hbv, rpr, hcv neg  Obesity (BMI 30-39.9) Estimated body mass index is 39.16 kg/m as calculated from the following:   Height as of this encounter: 5\' 7"  (1.702 m).   Weight as of this encounter: [id number] kg.  -Complicates overall prognosis    Subjective: Patient was sitting comfortably in chair when seen today.  No new complaints. Breathing comfortably. No chest pain. Tolerating diet.   Physical Exam: Vitals:   07/30/22 0500 07/30/22 0600 07/30/22 0701 07/30/22 1000  BP: 125/71 137/70 126/73 134/79  Pulse: (!) 103 (!) 101 99   Resp: 19 17 19    Temp:   98 F (36.7 C)   TempSrc:   Oral   SpO2: 95% 100% 99%   Weight:      Height:       General.   no acute distress. Pulmonary.  Lungs clear bilaterally, normal respiratory effort. CV.  Regular rate and rhythm, soft systolic murmur Abdomen.  Soft, nontender, nondistended, BS positive. CNS.  Alert and oriented .  No focal neurologic deficit. Extremities.  2+ LE edema, no cyanosis, pulses intact and symmetrical. Psychiatry.  Judgment and insight appears normal.   Data Reviewed: Prior data reviewed  Family Communication: none @ bedside  Disposition: Status is: Inpatient Remains inpatient appropriate because: Severity of illness  Planned Discharge Destination: Home    Time spent: 25 min  Author: Desma Maxim, MD 07/30/2022 2:08 PM  For on call review [email or web address].

## 2022-07-30 NOTE — Progress Notes (Signed)
Rounding Note    Patient Name: Molly Howard Date of Encounter: 07/30/2022  [place] Cardiologist: [name], MD   Subjective   Dyspnea and lower extremity swelling significantly improved.  Documented UOP 9.9 L for the past 24 hours, net - 14.9 L for the admission. Staff reports a significant loss urine in the bed overnight as well. Remains on milrinone and Lasix gtts. Coox 99 this morning. CVP 11-13. Renal function stable.  CO2 trending up.   Inpatient Medications    Scheduled Meds:  calcium carbonate  1 tablet Oral TID WC   carvedilol  3.125 mg Oral BID WC   Chlorhexidine Gluconate Cloth  6 each Topical Daily   dapagliflozin propanediol  10 mg Oral Daily   enoxaparin (LOVENOX) injection  0.5 mg/kg Subcutaneous Q24H   famotidine  20 mg Oral BID   ferrous sulfate  325 mg Oral Q breakfast   insulin aspart  0-5 Units Subcutaneous QHS   insulin aspart  0-9 Units Subcutaneous TID WC   insulin glargine-yfgn  18 Units Subcutaneous BID   magnesium oxide  800 mg Oral Daily   sacubitril-valsartan  1 tablet Oral BID   sodium chloride flush  10-40 mL Intracatheter Q12H   sodium chloride flush  3 mL Intravenous Q12H   sodium chloride flush  3 mL Intravenous Q12H   spironolactone  25 mg Oral Daily   Vitamin D (Ergocalciferol)  50,000 Units Oral Q7 days   Continuous Infusions:  sodium chloride     furosemide (LASIX) 200 mg in dextrose 5 % 100 mL (2 mg/mL) infusion 10 mg/hr (07/30/22 0619)   milrinone 0.25 mcg/kg/min (07/30/22 0619)   PRN Meds: sodium chloride, acetaminophen **OR** acetaminophen, albuterol, benzonatate, magnesium hydroxide, ondansetron **OR** ondansetron (ZOFRAN) IV, sodium chloride flush, sodium chloride flush, traZODone   Vital Signs    Vitals:   07/30/22 0400 07/30/22 0500 07/30/22 0600 07/30/22 0701  BP: (!) 144/75 125/71 137/70 126/73  Pulse: (!) 102 (!) 103 (!) 101 99  Resp: 19 19 17 19   Temp: 98.1 F (36.7 C)     TempSrc:      SpO2:  94% 95% 100% 99%  Weight:      Height:        Intake/Output Summary (Last 24 hours) at 07/30/2022 0805 Last data filed at 07/30/2022 0744 Gross per 24 hour  Intake 1448.39 ml  Output 11725 ml  Net -10276.61 ml       07/29/2022    5:00 AM 07/28/2022   11:45 AM 07/28/2022    6:30 AM  Last 3 Weights  Weight (lbs) 250 lb 7.1 oz 255 lb 11.7 oz 253 lb 4.9 oz  Weight (kg) 113.6 kg 116 kg 114.9 kg      Telemetry    Sinus rhythm NSVT lasting 6 beats - Personally Reviewed  ECG    No new tracings - Personally Reviewed  Physical Exam   GEN: No acute distress.   Neck: Elevated JVD. Cardiac: RRR, no murmurs, rubs, or gallops.  Much improved bilateral leg and flank edema. Respiratory: Improved breath sounds bilaterally. GI: Soft, no tenderness.  The abdomen is distended. MS:  No deformity. Neuro:  Nonfocal  Psych: Normal affect   Labs    High Sensitivity Troponin:   Recent Labs  Lab 07/24/22 1729 07/24/22 1932  TROPONINIHS 13 14      Chemistry Recent Labs  Lab 07/24/22 1619 07/25/22 0510 07/29/22 0417 07/29/22 2242 07/30/22 0521  NA 132*   < >  135 132* 136  K 3.7   < > 3.9 3.5 3.9  CL 96*   < > 97* 91* 94*  CO2 25   < > 31 31 33*  GLUCOSE 244*   < > 229* 313* 214*  BUN 28*   < > 28* 28* 28*  CREATININE 1.23*   < > 1.11* 1.23* 1.21*  CALCIUM 6.0*   < > 6.9* 7.2* 7.7*  MG  --   --  1.6*  --  1.6*  PROT 6.2*  --   --   --   --   ALBUMIN 1.7*  --   --   --   --   AST 19  --   --   --   --   ALT 13  --   --   --   --   ALKPHOS 144*  --   --   --   --   BILITOT 0.7  --   --   --   --   GFRNONAA 58*   < > >60 58* 59*  ANIONGAP 11   < > 7 10 9    < > = values in this interval not displayed.     Lipids No results for input(s): "CHOL", "TRIG", "HDL", "LABVLDL", "LDLCALC", "CHOLHDL" in the last 168 hours.  Hematology Recent Labs  Lab 07/24/22 1619 07/25/22 0510 07/28/22 1013 07/29/22 0417  WBC 5.8 5.5  --  5.0  RBC 4.20 3.91  --  3.64*  HGB 10.2* 9.4* 10.5*  8.7*  HCT 35.4* 32.9* 31.0* 30.6*  MCV 84.3 84.1  --  84.1  MCH 24.3* 24.0*  --  23.9*  MCHC 28.8* 28.6*  --  28.4*  RDW 19.3* 19.2*  --  19.4*  PLT 395 393  --  356    Thyroid No results for input(s): "TSH", "FREET4" in the last 168 hours.  BNP Recent Labs  Lab 07/24/22 1619  BNP 1,400.4*     DDimer No results for input(s): "DDIMER" in the last 168 hours.   Radiology      Cardiac Studies   RHC 07/28/2022: Successful right heart catheterization via the right antecubital vein. Severely elevated right and left-sided filling pressures, moderate pulmonary hypertension and moderately reduced cardiac output.   RA: 30 mmHg RV: 56/14 mmHg PW: 34 mmHg PA: 54/30 with a mean of 41 mmHg PA sat is 42% Cardiac output: 4.47 with a cardiac index of 2. CPO is 0.89 PAPI: 0.8   Recommendations: The patient is severely volume overloaded with evidence of biventricular failure right more than left by hemodynamics. Recommend starting milrinone drip and furosemide drip.  Will transfer to stepdown unit. __________  2D echo 07/25/2022: 1. Left ventricular ejection fraction, by estimation, is 25 to 30%. Left  ventricular ejection fraction by 2D MOD biplane is 21.6 %. The left  ventricle has severely decreased function. The left ventricle demonstrates  global hypokinesis. Left ventricular  diastolic parameters are indeterminate.   2. Right ventricular systolic function is mildly reduced. The right  ventricular size is normal. There is moderately elevated pulmonary artery  systolic pressure. The estimated right ventricular systolic pressure is  [id number] mmHg.   3. Left atrial size was mildly dilated.   4. A small pericardial effusion is present. The pericardial effusion is  circumferential. There is no evidence of cardiac tamponade.   5. The aortic valve is tricuspid. Aortic valve regurgitation is not  visualized. No aortic stenosis is present.   6. The  mitral valve is normal in structure.  Mild mitral valve  regurgitation. No evidence of mitral stenosis.   7. Tricuspid valve regurgitation is moderate to severe.   Patient Profile     37 y.o. female a history of HFrEF, nonischemic cardiomyopathy, hypertension, type 2 diabetes mellitus, stage II chronic kidney disease, GERD, microcytic anemia, obesity, medication nonadherence, and polysubstance abuse including cocaine, who presented with acute on chronic systolic heart failure  Assessment & Plan    1.  Acute on chronic systolic heart failure with severely reduced LV systolic function due to NICM: -Significant UOP yesterday with Lasix gtt, milrinone and metolazone, appears to be > 10 L in 24 hours. -Coox 99. -Given the above, reduce milrinone gtt to 12.5 and Lasix gtt to 4 mg/hr -Check BMP at noon.  -Continue small dose spironolactone 12.5 mg once daily, Farxiga, Coreg, and [place]. -Monitor CVP and Coox. -Given that cardiac output was not severely reduced and her blood pressure is elevated, it is reasonable for now to continue small dose carvedilol. -Follow up with advanced heart failure team  2.  Polysubstance abuse including cocaine: -She has been consistently positive for cocaine on urine drug screen.   -As such, she is not a candidate for ICD placement, or advanced/invasive heart failure therapies. -Cessation is recommended.  3.  Medication nonadherence:  -This is a significant issue that has led to multiple hospitalizations.      For questions or updates, please contact [place] Please consult [email or web address] for contact info under        Signed, Christell Faith, PA-C  07/30/2022, 8:05 AM

## 2022-07-31 DIAGNOSIS — I428 Other cardiomyopathies: Secondary | ICD-10-CM | POA: Diagnosis not present

## 2022-07-31 DIAGNOSIS — F1410 Cocaine abuse, uncomplicated: Secondary | ICD-10-CM | POA: Diagnosis not present

## 2022-07-31 LAB — GLUCOSE, CAPILLARY
Glucose-Capillary: 120 mg/dL — ABNORMAL HIGH (ref 70–99)
Glucose-Capillary: 209 mg/dL — ABNORMAL HIGH (ref 70–99)
Glucose-Capillary: 262 mg/dL — ABNORMAL HIGH (ref 70–99)
Glucose-Capillary: 284 mg/dL — ABNORMAL HIGH (ref 70–99)

## 2022-07-31 LAB — COOXEMETRY PANEL
Carboxyhemoglobin: 2.8 % — ABNORMAL HIGH (ref 0.5–1.5)
Methemoglobin: 0.8 % (ref 0.0–1.5)
O2 Saturation: 83.8 %
Total hemoglobin: 16.8 g/dL — ABNORMAL HIGH (ref 12.0–16.0)
Total oxygen content: 80.7 %

## 2022-07-31 LAB — BASIC METABOLIC PANEL
Anion gap: 14 (ref 5–15)
BUN: 30 mg/dL — ABNORMAL HIGH (ref 6–20)
CO2: 34 mmol/L — ABNORMAL HIGH (ref 22–32)
Calcium: 8.3 mg/dL — ABNORMAL LOW (ref 8.9–10.3)
Chloride: 89 mmol/L — ABNORMAL LOW (ref 98–111)
Creatinine, Ser: 1.29 mg/dL — ABNORMAL HIGH (ref 0.44–1.00)
GFR, Estimated: 54 mL/min — ABNORMAL LOW (ref >60)
Glucose, Bld: 197 mg/dL — ABNORMAL HIGH (ref 70–99)
Potassium: 3.7 mmol/L (ref 3.5–5.1)
Sodium: 137 mmol/L (ref 135–145)

## 2022-07-31 LAB — MAGNESIUM: Magnesium: 2.3 mg/dL (ref 1.7–2.4)

## 2022-07-31 MED ORDER — INSULIN GLARGINE-YFGN 100 UNIT/ML ~~LOC~~ SOLN
20.0000 [IU] | Freq: Two times a day (BID) | SUBCUTANEOUS | Status: DC
  Administered 2022-07-31 – 2022-08-04 (×8): 20 [IU] via SUBCUTANEOUS
  Filled 2022-07-31 (×9): qty 0.2

## 2022-07-31 MED ORDER — MILRINONE LACTATE IN DEXTROSE 20-5 MG/100ML-% IV SOLN
0.1250 ug/kg/min | INTRAVENOUS | Status: DC
  Administered 2022-07-31 – 2022-08-02 (×4): 0.125 ug/kg/min via INTRAVENOUS
  Filled 2022-07-31 (×3): qty 100

## 2022-07-31 MED ORDER — ENOXAPARIN SODIUM 60 MG/0.6ML IJ SOSY
0.5000 mg/kg | PREFILLED_SYRINGE | INTRAMUSCULAR | Status: DC
  Filled 2022-07-31: qty 0.6

## 2022-07-31 MED ORDER — ORAL CARE MOUTH RINSE: 15.0000 mL | OROMUCOSAL | Status: DC | PRN

## 2022-07-31 NOTE — Progress Notes (Signed)
Education provided to patient regarding heart healthy diet including sodium and fluid restriction  to avoid fluid retention. Patient able to teach back. 1559mL fluid diet recommended by MD, patient states she follows a 1568mL fluid restriction diet at home. Patient states she does not drink any alcohol. Erling Conte, RN

## 2022-07-31 NOTE — Progress Notes (Signed)
   07/31/22 1554  Provider Notification  Provider Name/Title S. Jamse Arn, MD  Date Provider Notified 07/31/22  Time Provider Notified 1619  Method of Notification Page (secure chat)  Notification Reason Red med refusal (Lovenox SQ)   Patient educated on indication for anticoagulant in acute illness, patient verbalized understanding. Decided to refuse.

## 2022-07-31 NOTE — Progress Notes (Signed)
PROGRESS NOTE    Molly Howard  [id number]  DOB: May 04, 1985  DOA: 07/24/2022 PCP: [name], DO Outpatient Specialists:   Hospital course:  37 year old female with history of nonischemic cardiomyopathy, DM2, ongoing cocaine use was admitted for decompensated HFrEF.  EF is 25 to 30% with LV global hypokinesis from echo this admission. Patient admitted for IV diuresis.   3/24: Vital stable, labs with pseudohyponatremia secondary to hyperglycemia, potassium at 3.4, current pain improved to 1.09.  Hemoglobin at 9.4, CBG elevated at 218.  Patient has about 25 pound weight gain and significant edema involving lower extremity and flank.  Cardiology added metolazone.  Holding home Entresto and carvedilol due to borderline blood pressure.  Concern of being noncompliant.  UDS positive for cocaine.   3/25; hemodynamically stable, blood pressure started trending up so home Entresto was restarted.  UOP of 2 L, but weight apparently increased.  Most likely some error.  Renal function stable so we will continue diuresis.   3/26: Vital stable.  Slight worsening of creatinine as compared to yesterday but remained within her baseline.  Will continue diuresis after discussing with cardiology.  She might be going for right heart catheterization tomorrow for better evaluation of her volume status. Weight and UOP recorded seems inaccurate.   3/27: RHC performed showing severely elevated right and left-sided filling pressures, moderate pulmonary hypertension and moderately reduced cardiac output with severe RV dysfunction. Transferred to ICU and started on lasix and milrinone gtts   3/29: continuing IV diuresis  3/30: Dyspnea and LE swelling are improved, patient is net -21 L since admission.  Patient remains on Lasix and milrinone drip.     Subjective:  Patient states she is doing okay.  Has no questions.  Feels better.   Objective: Vitals:   07/31/22 0500 07/31/22 0600 07/31/22 0800  07/31/22 0827  BP: 100/62 (!) 92/53 94/78   Pulse: (!) 101 94 98   Resp: 16 19 (!) 22   Temp:    98.1 F (36.7 C)  TempSrc:    Oral  SpO2: 96% 92% 99%   Weight:      Height:        Intake/Output Summary (Last 24 hours) at 07/31/2022 1620 Last data filed at 07/31/2022 1613 Gross per 24 hour  Intake 2088.05 ml  Output 4400 ml  Net -2311.95 ml   Filed Weights   07/28/22 1145 07/29/22 0500 07/31/22 0345  Weight: 116 kg 113.6 kg 111.8 kg     Exam:  General: Somewhat apathetic female lying in bed watching TV in no respiratory distress Eyes: sclera anicteric, conjuctiva mild injection bilaterally CVS: S1-S2, regular  Respiratory:  decreased air entry bilaterally secondary to decreased inspiratory effort, rales at bases  GI: NABS, soft, NT  LE: Warm and well-perfused  Data Reviewed:  Basic Metabolic Panel: Recent Labs  Lab 07/29/22 0417 07/29/22 2242 07/30/22 0521 07/30/22 1216 07/31/22 0343  NA 135 132* 136 133* 137  K 3.9 3.5 3.9 3.9 3.7  CL 97* 91* 94* 87* 89*  CO2 31 31 33* 33* 34*  GLUCOSE 229* 313* 214* 307* 197*  BUN 28* 28* 28* 27* 30*  CREATININE 1.11* 1.23* 1.21* 1.13* 1.29*  CALCIUM 6.9* 7.2* 7.7* 8.1* 8.3*  MG 1.6*  --  1.6*  --  2.3  PHOS 4.3  --   --   --   --     CBC: Recent Labs  Lab 07/25/22 0510 07/28/22 1013 07/29/22 0417  WBC 5.5  --  5.0  HGB 9.4* 10.5* 8.7*  HCT 32.9* 31.0* 30.6*  MCV 84.1  --  84.1  PLT 393  --  356     Scheduled Meds:  calcium carbonate  1 tablet Oral TID WC   carvedilol  3.125 mg Oral BID WC   Chlorhexidine Gluconate Cloth  6 each Topical Daily   dapagliflozin propanediol  10 mg Oral Daily   [START ON 08/01/2022] enoxaparin (LOVENOX) injection  0.5 mg/kg Subcutaneous Q24H   famotidine  20 mg Oral BID   ferrous sulfate  325 mg Oral Q breakfast   insulin aspart  0-5 Units Subcutaneous QHS   insulin aspart  0-9 Units Subcutaneous TID WC   insulin glargine-yfgn  18 Units Subcutaneous BID   sacubitril-valsartan   1 tablet Oral BID   sodium chloride flush  10-40 mL Intracatheter Q12H   sodium chloride flush  3 mL Intravenous Q12H   sodium chloride flush  3 mL Intravenous Q12H   spironolactone  25 mg Oral Daily   Vitamin D (Ergocalciferol)  50,000 Units Oral Q7 days   Continuous Infusions:  sodium chloride     furosemide (LASIX) 200 mg in dextrose 5 % 100 mL (2 mg/mL) infusion 4 mg/hr (07/31/22 1553)   milrinone 0.125 mcg/kg/min (07/31/22 1553)     Assessment & Plan:   Decompensated HFrEF Patient is doing well on milrinone and Lasix drips. She had put out 6 L yesterday. Is down by 21 L since admission, feels better Continue Lasix and milrinone per cardiology recommendations Continue carvedilol, spironolactone, [place]ade and Entresto as well.  DM 2 Blood sugars remain in the 200s Increase glargine to 20 units twice daily Continue Farxiga and SSI  CKD 2 Continue to follow creatinine as we diurese  Hypomagnesemia Resolved with repletion  Ongoing cocaine use Patient has been extensively counseled and is already suffering the ill effects given her nonischemic cardiomyopathy.  Patient appears to be in precontemplation regarding behavior change.    DVT prophylaxis: Lovenox--however patient has refused Lovenox injections for the past 3 days despite being told that this is to decrease her risk of pulmonary emboli which could easily be life-threatening given her significantly decreased ejection fraction.  Patient continues to refuse prophylaxis.  Consider changing to SCD if patient will allow. Code Status: Full     Studies: No results found.  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 (HCC) Active Problems:   Type II diabetes mellitus with renal manifestations (HCC)   Hypocalcemia   Dyslipidemia   Chronic kidney disease (CKD), stage II (mild)   GERD without esophagitis   Nonischemic cardiomyopathy (HCC)   Acute on  chronic congestive heart failure (HCC)   Obesity (BMI 30-39.9)   Acute pulmonary edema (HCC)   Cocaine abuse (HCC)   Leg edema   Acute on chronic combined systolic and diastolic CHF (congestive heart failure) ([place])     Ailany Koren Derek Jack, [facility]  If 7PM-7AM, please contact night-coverage [email or web address]   LOS: 7 days

## 2022-07-31 NOTE — Progress Notes (Signed)
Rounding Note    Patient Name: Molly Howard Date of Encounter: 07/31/2022  [place] Cardiologist: [name], MD   Subjective   Dyspnea and lower extremity swelling significantly improved. No chest pain. Documented UOP 6.3 L for the past 24 hours, net - 21.4 L for the admission. Remains on Lasix and milrinone gtts. Coox 83. CO2, BUN/SCr starting to trend up.   Inpatient Medications    Scheduled Meds:  calcium carbonate  1 tablet Oral TID WC   carvedilol  3.125 mg Oral BID WC   Chlorhexidine Gluconate Cloth  6 each Topical Daily   dapagliflozin propanediol  10 mg Oral Daily   [START ON 08/01/2022] enoxaparin (LOVENOX) injection  0.5 mg/kg Subcutaneous Q24H   famotidine  20 mg Oral BID   ferrous sulfate  325 mg Oral Q breakfast   insulin aspart  0-5 Units Subcutaneous QHS   insulin aspart  0-9 Units Subcutaneous TID WC   insulin glargine-yfgn  18 Units Subcutaneous BID   sacubitril-valsartan  1 tablet Oral BID   sodium chloride flush  10-40 mL Intracatheter Q12H   sodium chloride flush  3 mL Intravenous Q12H   sodium chloride flush  3 mL Intravenous Q12H   spironolactone  25 mg Oral Daily   Vitamin D (Ergocalciferol)  50,000 Units Oral Q7 days   Continuous Infusions:  sodium chloride     furosemide (LASIX) 200 mg in dextrose 5 % 100 mL (2 mg/mL) infusion 4 mg/hr (07/31/22 0600)   milrinone 0.125 mcg/kg/min (07/31/22 0628)   PRN Meds: sodium chloride, acetaminophen **OR** acetaminophen, albuterol, benzonatate, magnesium hydroxide, ondansetron **OR** ondansetron (ZOFRAN) IV, sodium chloride flush, sodium chloride flush, traZODone   Vital Signs    Vitals:   07/31/22 0500 07/31/22 0600 07/31/22 0800 07/31/22 0827  BP: 100/62 (!) 92/53 94/78   Pulse: (!) 101 94 98   Resp: 16 19 (!) 22   Temp:    98.1 F (36.7 C)  TempSrc:    Oral  SpO2: 96% 92% 99%   Weight:      Height:        Intake/Output Summary (Last 24 hours) at 07/31/2022 1052 Last data  filed at 07/31/2022 1049 Gross per 24 hour  Intake 1666.97 ml  Output 7400 ml  Net -5733.03 ml       07/31/2022    3:45 AM 07/29/2022    5:00 AM 07/28/2022   11:45 AM  Last 3 Weights  Weight (lbs) 246 lb 7.6 oz 250 lb 7.1 oz 255 lb 11.7 oz  Weight (kg) 111.8 kg 113.6 kg 116 kg      Telemetry    Sinus rhythm with a ventricular triplet - Personally Reviewed  ECG    No new tracings - Personally Reviewed  Physical Exam   GEN: No acute distress.   Neck: JVD improved. Cardiac: RRR, no murmurs, rubs, or gallops.  Much improved bilateral leg and flank edema, now trivial. Respiratory: Improved breath sounds bilaterally. GI: Soft, no tenderness.  The abdomen is distended. MS:  No deformity. Neuro:  Nonfocal  Psych: Normal affect   Labs    High Sensitivity Troponin:   Recent Labs  Lab 07/24/22 1729 07/24/22 1932  TROPONINIHS 13 14      Chemistry Recent Labs  Lab 07/24/22 1619 07/25/22 0510 07/29/22 0417 07/29/22 2242 07/30/22 0521 07/30/22 1216 07/31/22 0343  NA 132*   < > 135   < > 136 133* 137  K 3.7   < >  3.9   < > 3.9 3.9 3.7  CL 96*   < > 97*   < > 94* 87* 89*  CO2 25   < > 31   < > 33* 33* 34*  GLUCOSE 244*   < > 229*   < > 214* 307* 197*  BUN 28*   < > 28*   < > 28* 27* 30*  CREATININE 1.23*   < > 1.11*   < > 1.21* 1.13* 1.29*  CALCIUM 6.0*   < > 6.9*   < > 7.7* 8.1* 8.3*  MG  --   --  1.6*  --  1.6*  --  2.3  PROT 6.2*  --   --   --   --   --   --   ALBUMIN 1.7*  --   --   --   --   --   --   AST 19  --   --   --   --   --   --   ALT 13  --   --   --   --   --   --   ALKPHOS 144*  --   --   --   --   --   --   BILITOT 0.7  --   --   --   --   --   --   GFRNONAA 58*   < > >60   < > 59* >60 54*  ANIONGAP 11   < > 7   < > 9 13 14    < > = values in this interval not displayed.     Lipids No results for input(s): "CHOL", "TRIG", "HDL", "LABVLDL", "LDLCALC", "CHOLHDL" in the last 168 hours.  Hematology Recent Labs  Lab 07/24/22 1619 07/25/22 0510  07/28/22 1013 07/29/22 0417  WBC 5.8 5.5  --  5.0  RBC 4.20 3.91  --  3.64*  HGB 10.2* 9.4* 10.5* 8.7*  HCT 35.4* 32.9* 31.0* 30.6*  MCV 84.3 84.1  --  84.1  MCH 24.3* 24.0*  --  23.9*  MCHC 28.8* 28.6*  --  28.4*  RDW 19.3* 19.2*  --  19.4*  PLT 395 393  --  356    Thyroid No results for input(s): "TSH", "FREET4" in the last 168 hours.  BNP Recent Labs  Lab 07/24/22 1619  BNP 1,400.4*     DDimer No results for input(s): "DDIMER" in the last 168 hours.   Radiology      Cardiac Studies   RHC 07/28/2022: Successful right heart catheterization via the right antecubital vein. Severely elevated right and left-sided filling pressures, moderate pulmonary hypertension and moderately reduced cardiac output.   RA: 30 mmHg RV: 56/14 mmHg PW: 34 mmHg PA: 54/30 with a mean of 41 mmHg PA sat is 42% Cardiac output: 4.47 with a cardiac index of 2. CPO is 0.89 PAPI: 0.8   Recommendations: The patient is severely volume overloaded with evidence of biventricular failure right more than left by hemodynamics. Recommend starting milrinone drip and furosemide drip.  Will transfer to stepdown unit. __________  2D echo 07/25/2022: 1. Left ventricular ejection fraction, by estimation, is 25 to 30%. Left  ventricular ejection fraction by 2D MOD biplane is 21.6 %. The left  ventricle has severely decreased function. The left ventricle demonstrates  global hypokinesis. Left ventricular  diastolic parameters are indeterminate.   2. Right ventricular systolic function is mildly reduced. The right  ventricular size is  normal. There is moderately elevated pulmonary artery  systolic pressure. The estimated right ventricular systolic pressure is  [id number] mmHg.   3. Left atrial size was mildly dilated.   4. A small pericardial effusion is present. The pericardial effusion is  circumferential. There is no evidence of cardiac tamponade.   5. The aortic valve is tricuspid. Aortic valve regurgitation  is not  visualized. No aortic stenosis is present.   6. The mitral valve is normal in structure. Mild mitral valve  regurgitation. No evidence of mitral stenosis.   7. Tricuspid valve regurgitation is moderate to severe.   Patient Profile     37 y.o. female a history of HFrEF, nonischemic cardiomyopathy, hypertension, type 2 diabetes mellitus, stage II chronic kidney disease, GERD, microcytic anemia, obesity, medication nonadherence, and polysubstance abuse including cocaine, who presented with acute on chronic systolic heart failure  Assessment & Plan    1.  Acute on chronic systolic heart failure with severely reduced LV systolic function due to NICM: -Improving -Significant UOP.. -Remains on milrinone and Lasix gtts, will discuss with MD regarding plan given up-trending CO2, BUN, and SCr. -Continue small dose spironolactone 12.5 mg once daily, Farxiga, Coreg, and Arp. -Monitor CVP and Coox. -Given that cardiac output was not severely reduced and her blood pressure is elevated, it is reasonable for now to continue small dose carvedilol. -Follow up with advanced heart failure team. -Exacerbated by noncompliance.  -Previously assessed for ICD.   2.  Polysubstance abuse including cocaine: -She has been consistently positive for cocaine on urine drug screen.   -As such, she is not a candidate for ICD placement, or advanced/invasive heart failure therapies. -Cessation is recommended.  3.  Medication nonadherence:  -This is a significant issue that has led to multiple hospitalizations.      For questions or updates, please contact [place] Please consult [email or web address] for contact info under        Signed, Christell Faith, PA-C  07/31/2022, 10:52 AM

## 2022-08-01 DIAGNOSIS — I5043 Acute on chronic combined systolic (congestive) and diastolic (congestive) heart failure: Secondary | ICD-10-CM | POA: Diagnosis not present

## 2022-08-01 LAB — BASIC METABOLIC PANEL
Anion gap: 9 (ref 5–15)
BUN: 35 mg/dL — ABNORMAL HIGH (ref 6–20)
CO2: 35 mmol/L — ABNORMAL HIGH (ref 22–32)
Calcium: 8.5 mg/dL — ABNORMAL LOW (ref 8.9–10.3)
Chloride: 95 mmol/L — ABNORMAL LOW (ref 98–111)
Creatinine, Ser: 1.26 mg/dL — ABNORMAL HIGH (ref 0.44–1.00)
GFR, Estimated: 56 mL/min — ABNORMAL LOW (ref >60)
Glucose, Bld: 141 mg/dL — ABNORMAL HIGH (ref 70–99)
Potassium: 3.6 mmol/L (ref 3.5–5.1)
Sodium: 139 mmol/L (ref 135–145)

## 2022-08-01 LAB — COOXEMETRY PANEL
Carboxyhemoglobin: 3 % — ABNORMAL HIGH (ref 0.5–1.5)
Methemoglobin: <0.7 % (ref 0.0–1.5)
O2 Saturation: 86.6 %
Total hemoglobin: 10.8 g/dL — ABNORMAL LOW (ref 12.0–16.0)
Total oxygen content: 83.7 %

## 2022-08-01 LAB — CBC
HCT: 38.3 % (ref 36.0–46.0)
Hemoglobin: 10.7 g/dL — ABNORMAL LOW (ref 12.0–15.0)
MCH: 23.9 pg — ABNORMAL LOW (ref 26.0–34.0)
MCHC: 27.9 g/dL — ABNORMAL LOW (ref 30.0–36.0)
MCV: 85.7 fL (ref 80.0–100.0)
Platelets: 463 10*3/uL — ABNORMAL HIGH (ref 150–400)
RBC: 4.47 MIL/uL (ref 3.87–5.11)
RDW: 19.9 % — ABNORMAL HIGH (ref 11.5–15.5)
WBC: 6.3 10*3/uL (ref 4.0–10.5)
nRBC: 0 % (ref 0.0–0.2)

## 2022-08-01 LAB — GLUCOSE, CAPILLARY
Glucose-Capillary: 128 mg/dL — ABNORMAL HIGH (ref 70–99)
Glucose-Capillary: 243 mg/dL — ABNORMAL HIGH (ref 70–99)
Glucose-Capillary: 322 mg/dL — ABNORMAL HIGH (ref 70–99)
Glucose-Capillary: 320 mg/dL — ABNORMAL HIGH (ref 70–99)

## 2022-08-01 LAB — MAGNESIUM: Magnesium: 2.1 mg/dL (ref 1.7–2.4)

## 2022-08-01 MED ORDER — ENOXAPARIN SODIUM 60 MG/0.6ML IJ SOSY: 0.5000 mg/kg | PREFILLED_SYRINGE | INTRAMUSCULAR | Status: DC

## 2022-08-01 NOTE — Progress Notes (Addendum)
Rounding Note    Patient Name: Molly Howard Date of Encounter: 08/01/2022  [facility] Cardiologist: Debbe Odea, MD   Subjective   Slight headache yesterday, improved today.  Adequate urination, edema and abdominal distention continue to improve  Inpatient Medications    Scheduled Meds:  calcium carbonate  1 tablet Oral TID WC   carvedilol  3.125 mg Oral BID WC   Chlorhexidine Gluconate Cloth  6 each Topical Daily   dapagliflozin propanediol  10 mg Oral Daily   [START ON 08/02/2022] enoxaparin (LOVENOX) injection  0.5 mg/kg Subcutaneous Q24H   famotidine  20 mg Oral BID   ferrous sulfate  325 mg Oral Q breakfast   insulin aspart  0-5 Units Subcutaneous QHS   insulin aspart  0-9 Units Subcutaneous TID WC   insulin glargine-yfgn  20 Units Subcutaneous BID   sacubitril-valsartan  1 tablet Oral BID   sodium chloride flush  10-40 mL Intracatheter Q12H   sodium chloride flush  3 mL Intravenous Q12H   sodium chloride flush  3 mL Intravenous Q12H   spironolactone  25 mg Oral Daily   Vitamin D (Ergocalciferol)  50,000 Units Oral Q7 days   Continuous Infusions:  sodium chloride     furosemide (LASIX) 200 mg in dextrose 5 % 100 mL (2 mg/mL) infusion 4 mg/hr (08/01/22 1200)   milrinone 0.125 mcg/kg/min (08/01/22 1200)   PRN Meds: sodium chloride, acetaminophen **OR** acetaminophen, albuterol, benzonatate, magnesium hydroxide, ondansetron **OR** ondansetron (ZOFRAN) IV, mouth rinse, sodium chloride flush, sodium chloride flush, traZODone   Vital Signs    Vitals:   08/01/22 0800 08/01/22 0825 08/01/22 1000 08/01/22 1200  BP: 125/76  117/70 111/79  Pulse: (!) 108 (!) 101 (!) 101 100  Resp: 18  20 20   Temp: 98.6 F (37 C)   97.9 F (36.6 C)  TempSrc: Oral   Oral  SpO2: 97%  97% 99%  Weight:  85.6 kg    Height:        Intake/Output Summary (Last 24 hours) at 08/01/2022 1249 Last data filed at 08/01/2022 1204 Gross per 24 hour  Intake 1025.45 ml  Output  4950 ml  Net -3924.55 ml      08/01/2022    8:25 AM 07/31/2022    3:45 AM 07/29/2022    5:00 AM  Last 3 Weights  Weight (lbs) 188 lb 11.4 oz 246 lb 7.6 oz 250 lb 7.1 oz  Weight (kg) 85.6 kg 111.8 kg 113.6 kg      Telemetry    Sinus rhythm, heart rate 101- Personally Reviewed  ECG     - Personally Reviewed  Physical Exam   GEN: No acute distress.   Neck: No JVD Cardiac: RRR, no murmurs, rubs, or gallops.  Respiratory: No wheezing, reduced breath sounds at bases GI: Soft, nontender, distended  MS: Trace edema; No deformity. Neuro:  Nonfocal  Psych: Normal affect   Labs    High Sensitivity Troponin:   Recent Labs  Lab 07/24/22 1729 07/24/22 1932  TROPONINIHS 13 14     Chemistry Recent Labs  Lab 07/30/22 0521 07/30/22 1216 07/31/22 0343 08/01/22 0542  NA 136 133* 137 139  K 3.9 3.9 3.7 3.6  CL 94* 87* 89* 95*  CO2 33* 33* 34* 35*  GLUCOSE 214* 307* 197* 141*  BUN 28* 27* 30* 35*  CREATININE 1.21* 1.13* 1.29* 1.26*  CALCIUM 7.7* 8.1* 8.3* 8.5*  MG 1.6*  --  2.3 2.1  GFRNONAA 59* >60 54*  56*  ANIONGAP 9 13 14 9     Lipids No results for input(s): "CHOL", "TRIG", "HDL", "LABVLDL", "LDLCALC", "CHOLHDL" in the last 168 hours.  Hematology Recent Labs  Lab 07/28/22 1013 07/29/22 0417 08/01/22 0542  WBC  --  5.0 6.3  RBC  --  3.64* 4.47  HGB 10.5* 8.7* 10.7*  HCT 31.0* 30.6* 38.3  MCV  --  84.1 85.7  MCH  --  23.9* 23.9*  MCHC  --  28.4* 27.9*  RDW  --  19.4* 19.9*  PLT  --  356 463*   Thyroid No results for input(s): "TSH", "FREET4" in the last 168 hours.  BNPNo results for input(s): "BNP", "PROBNP" in the last 168 hours.  DDimer No results for input(s): "DDIMER" in the last 168 hours.   Radiology    No results found.  Cardiac Studies   Echocardiogram 3/24 EF 25%  Patient Profile     37 y.o. female female with history of nonischemic cardiomyopathy EF 25 to 30%, cocaine abuse presenting with shortness of breath and volume overload being seen  for CHF exacerbation. Context of medication noncompliance continues cocaine use.   Assessment & Plan    1.  Cardiomyopathy EF 25% -Volume overloaded, improving, net -3.9 L over the past 24 hours. -Continue milrinone and Lasix drip -Continue Coreg, Entresto, Aldactone, Farxiga.  -Not candidate for ICD due to continues cocaine use   2.  Cocaine use -Cessation recommended   Greater than 50% was spent in counseling and coordination of care with patient Total encounter time 50 minutes   Signed, Debbe Odea, MD  08/01/2022, 12:49 PM

## 2022-08-01 NOTE — Progress Notes (Signed)
PROGRESS NOTE    Molly Howard  [id number]  DOB: [birth year]  DOA: 07/24/2022 PCP: [name], DO Outpatient Specialists:   Hospital course:  37 year old female with history of nonischemic cardiomyopathy, DM2, ongoing cocaine use was admitted for decompensated HFrEF.  EF is 25 to 30% with LV global hypokinesis from echo this admission. Patient admitted for IV diuresis.   3/24: Vital stable, labs with pseudohyponatremia secondary to hyperglycemia, potassium at 3.4, current pain improved to 1.09.  Hemoglobin at 9.4, CBG elevated at 218.  Patient has about 25 pound weight gain and significant edema involving lower extremity and flank.  Cardiology added metolazone.  Holding home Entresto and carvedilol due to borderline blood pressure.  Concern of being noncompliant.  UDS positive for cocaine.   3/25; hemodynamically stable, blood pressure started trending up so home Entresto was restarted.  UOP of 2 L, but weight apparently increased.  Most likely some error.  Renal function stable so we will continue diuresis.   3/26: Vital stable.  Slight worsening of creatinine as compared to yesterday but remained within her baseline.  Will continue diuresis after discussing with cardiology.  She might be going for right heart catheterization tomorrow for better evaluation of her volume status. Weight and UOP recorded seems inaccurate.   3/27: RHC performed showing severely elevated right and left-sided filling pressures, moderate pulmonary hypertension and moderately reduced cardiac output with severe RV dysfunction. Transferred to ICU and started on lasix and milrinone gtts   3/29: continuing IV diuresis  3/30: Dyspnea and LE swelling are improved, patient is net -21 L since admission.  Patient remains on Lasix and milrinone drip.     Subjective:  Patient without any new complaints.  She admits that she has been refusing the Lovenox injections, does not want to get any more  injections.  Patient is agreeable to SCDs instead.   Objective: Vitals:   08/01/22 1200 08/01/22 1400 08/01/22 1551 08/01/22 1600  BP: 111/79 125/69 (!) 136/98   Pulse: 100 (!) 105    Resp: 20 (!) 22 (!) 29   Temp: 97.9 F (36.6 C)     TempSrc: Oral     SpO2: 99% 96%  99%  Weight:      Height:        Intake/Output Summary (Last 24 hours) at 08/01/2022 1610 Last data filed at 08/01/2022 1600 Gross per 24 hour  Intake 629.13 ml  Output 5325 ml  Net -4695.87 ml    Filed Weights   07/29/22 0500 07/31/22 0345 08/01/22 0825  Weight: 113.6 kg 111.8 kg 85.6 kg     Exam:  General: Patient lying in bed watching TV in NAD Eyes: sclera anicteric, conjuctiva mild injection bilaterally CVS: S1-S2, regular  Respiratory:  decreased air entry bilaterally secondary to decreased inspiratory effort, rales at bases  GI: NABS, soft, NT  LE: Warm and well-perfused  Data Reviewed:  Basic Metabolic Panel: Recent Labs  Lab 07/29/22 0417 07/29/22 2242 07/30/22 0521 07/30/22 1216 07/31/22 0343 08/01/22 0542  NA 135 132* 136 133* 137 139  K 3.9 3.5 3.9 3.9 3.7 3.6  CL 97* 91* 94* 87* 89* 95*  CO2 31 31 33* 33* 34* 35*  GLUCOSE 229* 313* 214* 307* 197* 141*  BUN 28* 28* 28* 27* 30* 35*  CREATININE 1.11* 1.23* 1.21* 1.13* 1.29* 1.26*  CALCIUM 6.9* 7.2* 7.7* 8.1* 8.3* 8.5*  MG 1.6*  --  1.6*  --  2.3 2.1  PHOS 4.3  --   --   --   --   --  CBC: Recent Labs  Lab 07/28/22 1013 07/29/22 0417 08/01/22 0542  WBC  --  5.0 6.3  HGB 10.5* 8.7* 10.7*  HCT 31.0* 30.6* 38.3  MCV  --  84.1 85.7  PLT  --  356 463*      Scheduled Meds:  calcium carbonate  1 tablet Oral TID WC   carvedilol  3.125 mg Oral BID WC   Chlorhexidine Gluconate Cloth  6 each Topical Daily   dapagliflozin propanediol  10 mg Oral Daily   famotidine  20 mg Oral BID   ferrous sulfate  325 mg Oral Q breakfast   insulin aspart  0-5 Units Subcutaneous QHS   insulin aspart  0-9 Units Subcutaneous TID WC    insulin glargine-yfgn  20 Units Subcutaneous BID   sacubitril-valsartan  1 tablet Oral BID   sodium chloride flush  10-40 mL Intracatheter Q12H   sodium chloride flush  3 mL Intravenous Q12H   sodium chloride flush  3 mL Intravenous Q12H   spironolactone  25 mg Oral Daily   Vitamin D (Ergocalciferol)  50,000 Units Oral Q7 days   Continuous Infusions:  sodium chloride     furosemide (LASIX) 200 mg in dextrose 5 % 100 mL (2 mg/mL) infusion 4 mg/hr (08/01/22 1600)   milrinone 0.125 mcg/kg/min (08/01/22 1600)     Assessment & Plan:   Decompensated HFrEF Patient is doing well on milrinone and Lasix drips. She had put out 3 L yesterday. Is down by 25 L since admission, feels better Continue Lasix and milrinone per cardiology recommendations Continue carvedilol, spironolactone, [place] Glade and Entresto as well.  DM 2 Blood sugars are somewhat improved but not optimized on glargine increased to 20u twice daily yesterday.  Would uptitrate up if sugars remain high. Continue Farxiga and SSI  CKD 2 Continue to follow creatinine as we diurese Recently at baseline  Hypomagnesemia Resolved with repletion  Ongoing cocaine use Patient has been extensively counseled and is already suffering the ill effects given her nonischemic cardiomyopathy.      DVT prophylaxis: SCD--patient does not want to get Lovenox injections, agrees to SCD Code Status: Full     Studies: No results found.  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 (HCC) Active Problems:   Type II diabetes mellitus with renal manifestations (HCC)   Hypocalcemia   Dyslipidemia   Chronic kidney disease (CKD), stage II (mild)   GERD without esophagitis   Nonischemic cardiomyopathy (HCC)   Acute on chronic congestive heart failure (HCC)   Obesity (BMI 30-39.9)   Acute pulmonary edema (HCC)   Cocaine abuse (HCC)   Leg edema   Acute on chronic combined  systolic and diastolic CHF (congestive heart failure) ([place])   NICM (nonischemic cardiomyopathy) ([place])     Kendyn Zaman Tublu Kacie Huxtable, [facility]  If 7PM-7AM, please contact night-coverage [email or web address]   LOS: 8 days

## 2022-08-02 ENCOUNTER — Encounter: Payer: Medicaid Other | Admitting: Family

## 2022-08-02 DIAGNOSIS — E871 Hypo-osmolality and hyponatremia: Secondary | ICD-10-CM

## 2022-08-02 DIAGNOSIS — E876 Hypokalemia: Secondary | ICD-10-CM

## 2022-08-02 DIAGNOSIS — E1169 Type 2 diabetes mellitus with other specified complication: Secondary | ICD-10-CM

## 2022-08-02 DIAGNOSIS — I10 Essential (primary) hypertension: Secondary | ICD-10-CM

## 2022-08-02 DIAGNOSIS — I5043 Acute on chronic combined systolic (congestive) and diastolic (congestive) heart failure: Secondary | ICD-10-CM | POA: Diagnosis not present

## 2022-08-02 LAB — BASIC METABOLIC PANEL
Anion gap: 9 (ref 5–15)
BUN: 35 mg/dL — ABNORMAL HIGH (ref 6–20)
CO2: 31 mmol/L (ref 22–32)
Calcium: 8 mg/dL — ABNORMAL LOW (ref 8.9–10.3)
Chloride: 89 mmol/L — ABNORMAL LOW (ref 98–111)
Creatinine, Ser: 1.22 mg/dL — ABNORMAL HIGH (ref 0.44–1.00)
GFR, Estimated: 58 mL/min — ABNORMAL LOW (ref >60)
Glucose, Bld: 438 mg/dL — ABNORMAL HIGH (ref 70–99)
Potassium: 3.8 mmol/L (ref 3.5–5.1)
Sodium: 129 mmol/L — ABNORMAL LOW (ref 135–145)

## 2022-08-02 LAB — COOXEMETRY PANEL
Carboxyhemoglobin: 2.7 % — ABNORMAL HIGH (ref 0.5–1.5)
Methemoglobin: <0.7 % (ref 0.0–1.5)
O2 Saturation: 73.9 %
Total hemoglobin: 10.5 g/dL — ABNORMAL LOW (ref 12.0–16.0)
Total oxygen content: 71.5 %

## 2022-08-02 LAB — GLUCOSE, CAPILLARY
Glucose-Capillary: 262 mg/dL — ABNORMAL HIGH (ref 70–99)
Glucose-Capillary: 293 mg/dL — ABNORMAL HIGH (ref 70–99)
Glucose-Capillary: 308 mg/dL — ABNORMAL HIGH (ref 70–99)
Glucose-Capillary: 534 mg/dL (ref 70–99)

## 2022-08-02 LAB — MAGNESIUM: Magnesium: 1.9 mg/dL (ref 1.7–2.4)

## 2022-08-02 MED ORDER — MAGNESIUM SULFATE 2 GM/50ML IV SOLN
2.0000 g | Freq: Once | INTRAVENOUS | Status: AC
  Administered 2022-08-02: 2 g via INTRAVENOUS
  Filled 2022-08-02: qty 50

## 2022-08-02 MED ORDER — POTASSIUM CHLORIDE CRYS ER 20 MEQ PO TBCR
40.0000 meq | EXTENDED_RELEASE_TABLET | Freq: Once | ORAL | Status: AC
  Administered 2022-08-02: 40 meq via ORAL
  Filled 2022-08-02: qty 2

## 2022-08-02 MED ORDER — INSULIN ASPART 100 UNIT/ML IJ SOLN
5.0000 [IU] | Freq: Once | INTRAMUSCULAR | Status: AC
  Administered 2022-08-02: 5 [IU] via SUBCUTANEOUS
  Filled 2022-08-02: qty 1

## 2022-08-02 MED ORDER — DIGOXIN 125 MCG PO TABS
0.1250 mg | ORAL_TABLET | Freq: Every day | ORAL | Status: DC
  Administered 2022-08-02 – 2022-08-04 (×3): 0.125 mg via ORAL
  Filled 2022-08-02 (×3): qty 1

## 2022-08-02 NOTE — Consult Note (Addendum)
Advanced Heart Failure Team Consult Note   Primary Physician: [name], DO PCP-Cardiologist:  [name], MD  Reason for Consultation: A/C systolic HF  HPI:    Molly Howard is seen today for evaluation of A/c systolic HF at the request of Dr. Garen Lah.   Molly Howard is a 37 y.o. female with a history of HFrEF due to NICM, DM2, morbid obesity, medication nonadherence, and polysubstance abuse.    Onset of HF in 1/22 in setting of COVID. EF 20-25% Readmission June 2022 with cocaine EF 30 to 35% moderate TR Medication noncompliance, readmission July 2022 with acute CHF  cMRI 1/23 EF 42% RV 50% No LGE.   Admitted October 2023, admitted for diuresis. Dry weight 101.5 kg (223 lbs)   Admitted July 24, 2022 with abdominal distention, leg swelling, cough, PND orthopnea, weight over 250 pounds at home, likely 25 pounds or more above her dry weight Reported she has not used cocaine in 1 month, she was cocaine positive this admission  Echo 07/25/22 LVEF 25-30% RV mildly down Severe TR. RVSP 33mmhG Moderate pericardial effusion   RHC on 07/28/22 with severe biventricular failure. Started on milrinone and lasix gtt.  RA: 30 mmHg RV: 56/14 mmHg PW: 34 mmHg PA: 54/30 with a mean of 41 mmHg PA sat is 42% Cardiac output: 4.47 with a cardiac index of 2.0 CPO is 0.89 PAPI: 0.8  Now on milrinone 0.125 and lasix gtt at 4. Out nearly 30L since admit. Weight recorded at 207 pounds.   Co-ox yesterday 87% (?)   Resting comfortably in bed. Denies CP, SOB, orthopnea or PND   Review of Systems: [y] = yes, [ ]  = no   General: Weight gain [ ] ; Weight loss [ ] ; Anorexia [ ] ; Fatigue Blue.Reese ]; Fever [ ] ; Chills [ ] ; Weakness [ ]   Cardiac: Chest pain/pressure [ ] ; Resting SOB [ y]; Exertional SOB [ y]; Orthopnea Blue.Reese ]; Pedal Edema [ ] ; Palpitations [ ] ; Syncope [ ] ; Presyncope [ ] ; Paroxysmal nocturnal dyspnea[ ]   Pulmonary: Cough [ y]; Wheezing[ ] ; Hemoptysis[ ] ; Sputum [ ] ; Snoring [  ]  GI: Vomiting[ ] ; Dysphagia[ ] ; Melena[ ] ; Hematochezia [ ] ; Heartburn[ ] ; Abdominal pain [ ] ; Constipation [ ] ; Diarrhea [ ] ; BRBPR [ ]   GU: Hematuria[ ] ; Dysuria [ ] ; Nocturia[ ]   Vascular: Pain in legs with walking [ ] ; Pain in feet with lying flat [ ] ; Non-healing sores [ ] ; Stroke [ ] ; TIA [ ] ; Slurred speech [ ] ;  Neuro: Headaches[ ] ; Vertigo[ ] ; Seizures[ ] ; Paresthesias[ ] ;Blurred vision [ ] ; Diplopia [ ] ; Vision changes [ ]   Ortho/Skin: Arthritis [ y]; Joint pain [ y]; Muscle pain [ ] ; Joint swelling [ ] ; Back Pain [ ] ; Rash [ ]   Psych: Depression[y ]; Anxiety[ y]  Heme: Bleeding problems [ ] ; Clotting disorders [ ] ; Anemia [ ]   Endocrine: Diabetes [ y]; Thyroid dysfunction[ ]   Home Medications Prior to Admission medications   Medication Sig Start Date End Date Taking? Authorizing Provider  spironolactone (ALDACTONE) 25 MG tablet Take 0.5 tablets (12.5 mg total) by mouth daily. Patient not taking: Reported on 07/24/2022 05/28/22   Alisa Graff, FNP  torsemide 60 MG TABS Take 60 mg by mouth 2 (two) times daily. Patient taking differently: Take 40 mg by mouth 2 (two) times daily. 02/16/22 07/24/22 Yes Nolberto Hanlon, MD  albuterol (VENTOLIN HFA) 108 (90 Base) MCG/ACT inhaler Inhale 2 puffs into the lungs every 6 (six)  hours as needed for wheezing or shortness of breath. Patient not taking: Reported on 07/24/2022 06/08/22   Menshew, Dannielle Karvonen, PA-C  atorvastatin (LIPITOR) 20 MG tablet TAKE 1 TABLET BY MOUTH DAILY Patient not taking: Reported on 07/24/2022 12/14/21   Alisa Graff, FNP  benzonatate (TESSALON PERLES) 100 MG capsule Take 1-2 tabs TID prn cough Patient not taking: Reported on 07/24/2022 06/08/22   Menshew, Dannielle Karvonen, PA-C  carvedilol (COREG) 6.25 MG tablet Take 1 tablet (6.25 mg total) by mouth 2 (two) times daily with a meal. Patient not taking: Reported on 07/24/2022 12/11/20   Alisa Graff, FNP  chlorpheniramine-HYDROcodone (TUSSIONEX) 10-8 MG/5ML Take 5 mLs by  mouth every 12 (twelve) hours as needed for cough. Patient not taking: Reported on 07/24/2022 06/08/22   Menshew, Dannielle Karvonen, PA-C  dapagliflozin propanediol (FARXIGA) 10 MG TABS tablet Take 1 tablet (10 mg total) by mouth once daily. Patient not taking: Reported on 07/24/2022 11/05/21   Alisa Graff, FNP  famotidine (PEPCID) 20 MG tablet Take 1 tablet (20 mg total) by mouth 2 (two) times daily. Patient not taking: Reported on 07/24/2022 06/12/21   [name], DO  ferrous sulfate 325 (65 FE) MG tablet Take 1 tablet (325 mg total) by mouth daily with breakfast. 02/17/22 05/27/22  Nolberto Hanlon, MD  HYDROcodone-acetaminophen (NORCO/VICODIN) 5-325 MG tablet Take 1-2 tablets by mouth every 6 (six) hours as needed for moderate pain. Patient not taking: Reported on 07/24/2022 05/27/22   Delman Kitten, MD  insulin glargine (LANTUS SOLOSTAR) 100 UNIT/ML Solostar Pen Take 10 units in the morning and 15 in the evenings. Patient not taking: Reported on 07/24/2022 02/04/22   [name], DO  norethindrone (AYGESTIN) 5 MG tablet Take 1 tablet (5 mg total) by mouth daily. Patient not taking: Reported on 07/24/2022 [id number]   Copland, Elmo Putt B, PA-C  ondansetron (ZOFRAN-ODT) 4 MG disintegrating tablet Take 1 tablet (4 mg total) by mouth every 8 (eight) hours as needed for nausea or vomiting. Patient not taking: Reported on 07/24/2022 06/08/22   Menshew, Dannielle Karvonen, PA-C  potassium chloride SA (KLOR-CON M) 20 MEQ tablet Take 0.5 tablets (10 mEq total) by mouth daily. Patient not taking: Reported on 07/24/2022 04/29/21   Alisa Graff, FNP  sacubitril-valsartan (ENTRESTO) 97-103 MG Take 1 tablet by mouth 2 (two) times daily. Patient not taking: Reported on 07/24/2022 07/02/21   [name], MD  Potassium Chloride 40 MEQ/15ML (20%) SOLN Take 40 mEq by mouth 2 (two) times daily. 07/04/20 10/18/20  [name], MD    Past Medical History: Past Medical History:  Diagnosis Date   Acid reflux     Chronic HFrEF (heart failure with reduced ejection fraction) ([place])    a. 10/2019 Echo: EF 35-40%, GrII DD; b. 05/2020 Echo: EF 20-25%, glob HK; c. 10/2020 Echo: EF 30-35%, glob HK. GrII DD, Mildly red RV fxn. Mod TR; d.  05/2021 cMRI: EF 42%, no LGE. Nl RV size/fxn.   CKD (chronic kidney disease), stage II    Diabetes mellitus ([place])    H/O medication noncompliance    Hypertension    Microcytic anemia    NICM (nonischemic cardiomyopathy) ([place])    a. 10/2019 Echo: EF 35-40%; b. 10/2019 MV: No ischemia. Small apical defect-->breast attenuation; c. 05/2020 Echo: EF 20-25%; d. 10/2020 Echo: EF 30-35%, glob HK. GrII DD, Mildly red RV fxn. Mod TR; e. 05/2021 cMRI: EF 42%, no LGE. Nl RV size/fxn.   Obesity    Polysubstance  abuse [facility])     Past Surgical History: Past Surgical History:  Procedure Laterality Date   CHOLECYSTECTOMY     HERNIA REPAIR     RIGHT HEART CATH N/A 07/28/2022   Procedure: RIGHT HEART CATH;  Surgeon: Wellington Hampshire, MD;  Location: [place] [facility];  Service: Cardiovascular;  Laterality: N/A;    Family History: Family History  Problem Relation Age of Onset   Heart failure Mother        Onset of heart failure 20s.  Died in Apr 11, 2022.   Diabetes Mother    Hypertension Father    Diabetes Father     Social History: Social History   Socioeconomic History   Marital status: Single    Spouse name: Not on file   Number of children: Not on file   Years of education: Not on file   Highest education level: Not on file  Occupational History   Not on file  Tobacco Use   Smoking status: Former    Packs/day: .5    Types: Cigarettes    Quit date: 2022    Years since quitting: 2.2   Smokeless tobacco: Never  Vaping Use   Vaping Use: Never used  Substance and Sexual Activity   Alcohol use: Not Currently    Comment: ~ 4 shots/day on weekends only   Drug use: Not Currently    Types: Cocaine    Comment: Admits to using cocaine up and will February 2024.   Sexual  activity: Not Currently    Birth control/protection: None  Other Topics Concern   Not on file  Social History Narrative   Lives locally.  Does not routinely exercise.  Has been using cocaine.   Social Determinants of Health   Financial Resource Strain: High Risk (06/16/2020)   Overall Financial Resource Strain (CARDIA)    Difficulty of Paying Living Expenses: Hard  Food Insecurity: No Food Insecurity (07/25/2022)   Hunger Vital Sign    Worried About Running Out of Food in the Last Year: Never true    Ran Out of Food in the Last Year: Never true  Transportation Needs: No Transportation Needs (07/25/2022)   PRAPARE - Hydrologist (Medical): No    Lack of Transportation (Non-Medical): No  Physical Activity: Not on file  Stress: Not on file  Social Connections: Not on file    Allergies:  Allergies  Allergen Reactions   No [employer] Food Allergies Rash and Other (See Comments)    Lemons    Objective:    Vital Signs:   Temp:  [97.9 F (36.6 C)-98.2 F (36.8 C)] 98 F (36.7 C) (04/01 0432) Pulse Rate:  [100-105] 105 (03/31 1400) Resp:  [16-29] 19 (04/01 0850) BP: (108-136)/(66-98) 122/77 (04/01 0840) SpO2:  [96 %-99 %] 99 % (03/31 1600) Weight:  [94 kg] 94 kg (04/01 0439) Last BM Date : 08/01/22  Weight change: Filed Weights   07/31/22 0345 08/01/22 0825 08/02/22 0439  Weight: 111.8 kg 85.6 kg 94 kg    Intake/Output:   Intake/Output Summary (Last 24 hours) at 08/02/2022 1006 Last data filed at 08/02/2022 0700 Gross per 24 hour  Intake 723.9 ml  Output 5250 ml  Net -4526.1 ml      Physical Exam    General:  Obese woman lying in bed  No resp difficulty HEENT: normal Neck: supple. JVP flat. Carotids 2+ bilat; no bruits. No lymphadenopathy or thyromegaly appreciated. Cor: PMI nondisplaced. Regular tachy 2/6 MR/TR  Lungs: clear Abdomen: obese soft, nontender, nondistended. No hepatosplenomegaly. No bruits or masses. Good bowel  sounds. Extremities: no cyanosis, clubbing, rash, tr edema Neuro: alert & orientedx3, cranial nerves grossly intact. moves all 4 extremities w/o difficulty. Affect pleasant   Telemetry   Sinus 90-110 Personally reviewed  EKG    Sinus tach. Low volts. Normal QRS 02/10/22  Labs   Basic Metabolic Panel: Recent Labs  Lab 07/29/22 0417 07/29/22 2242 07/30/22 0521 07/30/22 1216 07/31/22 0343 08/01/22 0542 08/02/22 0436  NA 135   < > 136 133* 137 139 129*  K 3.9   < > 3.9 3.9 3.7 3.6 3.8  CL 97*   < > 94* 87* 89* 95* 89*  CO2 31   < > 33* 33* 34* 35* 31  GLUCOSE 229*   < > 214* 307* 197* 141* 438*  BUN 28*   < > 28* 27* 30* 35* 35*  CREATININE 1.11*   < > 1.21* 1.13* 1.29* 1.26* 1.22*  CALCIUM 6.9*   < > 7.7* 8.1* 8.3* 8.5* 8.0*  MG 1.6*  --  1.6*  --  2.3 2.1 1.9  PHOS 4.3  --   --   --   --   --   --    < > = values in this interval not displayed.    Liver Function Tests: No results for input(s): "AST", "ALT", "ALKPHOS", "BILITOT", "PROT", "ALBUMIN" in the last 168 hours. No results for input(s): "LIPASE", "AMYLASE" in the last 168 hours. No results for input(s): "AMMONIA" in the last 168 hours.  CBC: Recent Labs  Lab 07/28/22 1013 07/29/22 0417 08/01/22 0542  WBC  --  5.0 6.3  HGB 10.5* 8.7* 10.7*  HCT 31.0* 30.6* 38.3  MCV  --  84.1 85.7  PLT  --  356 463*    Cardiac Enzymes: No results for input(s): "CKTOTAL", "CKMB", "CKMBINDEX", "TROPONINI" in the last 168 hours.  BNP: BNP (last 3 results) Recent Labs    01/15/22 1422 02/09/22 1214 07/24/22 1619  BNP 792.8* 859.3* 1,400.4*    ProBNP (last 3 results) No results for input(s): "PROBNP" in the last 8760 hours.   CBG: Recent Labs  Lab 08/01/22 0755 08/01/22 1156 08/01/22 1614 08/01/22 2110 08/02/22 0731  GLUCAP 128* 243* 322* 320* 262*    Coagulation Studies: No results for input(s): "LABPROT", "INR" in the last 72 hours.   Imaging   No results found.   Medications:      Current Medications:  calcium carbonate  1 tablet Oral TID WC   carvedilol  3.125 mg Oral BID WC   Chlorhexidine Gluconate Cloth  6 each Topical Daily   dapagliflozin propanediol  10 mg Oral Daily   famotidine  20 mg Oral BID   ferrous sulfate  325 mg Oral Q breakfast   insulin aspart  0-5 Units Subcutaneous QHS   insulin aspart  0-9 Units Subcutaneous TID WC   insulin glargine-yfgn  20 Units Subcutaneous BID   sacubitril-valsartan  1 tablet Oral BID   sodium chloride flush  10-40 mL Intracatheter Q12H   sodium chloride flush  3 mL Intravenous Q12H   sodium chloride flush  3 mL Intravenous Q12H   spironolactone  25 mg Oral Daily   Vitamin D (Ergocalciferol)  50,000 Units Oral Q7 days    Infusions:  sodium chloride     furosemide (LASIX) 200 mg in dextrose 5 % 100 mL (2 mg/mL) infusion 4 mg/hr (08/02/22 0700)   milrinone 0.125 mcg/kg/min (  08/02/22 0700)       Assessment/Plan   1.  Acute on chronic systolic heart failure with biventricular HF/shock - Echo 1/22 EF 20-25% in setting of COVID - cMRI 1/23 EF 42% RV 50% No LGE.  - EF 25 to 30% by echo in May 2023 with grade 2 diastolic dysfunction.   Admitted October 2023, admitted for diuresis. Dry weight 101.5 kg (223 lbs)  - Echo 07/25/22 LVEF 25-30% RV mildly down Severe TR. RVSP 74mmhG Moderate pericardial effusion  - RHC 07/28/22: RA 30 PA 54/30 (41) PW 34 mmHg PA sat 42% Fick 4.5/2.0 PAPI:0.8 - Dry weight in October 2023 was 101.5 kg.   - Now on milrinone 0.125 - Volume status much improved on lasix gtt at 4. CVP now 6 with prominent v-waves - Stop lasix gtt. Start po torsemide in am.  - Recheck co-ox. If > 60% stop milrinone  - Continue carvedilol 3.125 bi - Continue farxiga 10  - Continue spiro 25 - Continue Entresto  - Add digoxin - She currently has end-stage biventricular HF likely due to ongoing polysubstance abuse. Stressed need for med compliance and avoidance of drug use. Currently not candidate for advanced  therapies   2. Hyponatremia - restrict FW  3. Hypokalemia/hypomag - supp   4.  Essential hypertension:  - meds as above  5.  Type 2 diabetes mellitus:  - Poorly controlled.  A1c was 9.6 in October.  Continue Farxiga.  Management per internal medicine. - Recheck HGBa1c    6.  Medication nonadherence:  - reports decent compliance - will need close f/u in HF clinic   7.  Polysubstance abuse:  - Notes that she had been using cocaine every other day until her mother passed in December.  Says she last used 1 month ago.  Tox screen positive this admi  CRITICAL CARE Performed by: Glori Bickers  Total critical care time: 35 minutes  Critical care time was exclusive of separately billable procedures and treating other patients.  Critical care was necessary to treat or prevent imminent or life-threatening deterioration.  Critical care was time spent personally by me (independent of midlevel providers or residents) on the following activities: development of treatment plan with patient and/or surrogate as well as nursing, discussions with consultants, evaluation of patient's response to treatment, examination of patient, obtaining history from patient or surrogate, ordering and performing treatments and interventions, ordering and review of laboratory studies, ordering and review of radiographic studies, pulse oximetry and re-evaluation of patient's condition.   Length of Stay: [place], MD  08/02/2022, 10:06 AM  Advanced Heart Failure Team Pager [phone] (M-F; 7a - 5p)  Please contact [facility] for night-coverage after hours (4p -7a ) and weekends on [email or web address]

## 2022-08-02 NOTE — Progress Notes (Signed)
PROGRESS NOTE    Molly Howard  [id number]  DOB: 10/20/1985  DOA: 07/24/2022 PCP: [name], DO Outpatient Specialists:   Hospital course:  37 year old female with history of nonischemic cardiomyopathy, DM2, ongoing cocaine use was admitted for decompensated HFrEF.  EF is 25 to 30% with LV global hypokinesis from echo this admission. Patient admitted for IV diuresis.   3/24: Vital stable, labs with pseudohyponatremia secondary to hyperglycemia, potassium at 3.4, current pain improved to 1.09.  Hemoglobin at 9.4, CBG elevated at 218.  Patient has about 25 pound weight gain and significant edema involving lower extremity and flank.  Cardiology added metolazone.  Holding home Entresto and carvedilol due to borderline blood pressure.  Concern of being noncompliant.  UDS positive for cocaine.   3/25; hemodynamically stable, blood pressure started trending up so home Entresto was restarted.  UOP of 2 L, but weight apparently increased.  Most likely some error.  Renal function stable so we will continue diuresis.   3/26: Vital stable.  Slight worsening of creatinine as compared to yesterday but remained within her baseline.  Will continue diuresis after discussing with cardiology.  She might be going for right heart catheterization tomorrow for better evaluation of her volume status. Weight and UOP recorded seems inaccurate.   3/27: RHC performed showing severely elevated right and left-sided filling pressures, moderate pulmonary hypertension and moderately reduced cardiac output with severe RV dysfunction. Transferred to ICU and started on lasix and milrinone gtts   3/29: continuing IV diuresis  3/30: Dyspnea and LE swelling are improved, patient is net -21 L since admission.  Patient remains on Lasix and milrinone drip.  4/1: Lasix drip was discontinued, torsemide and digoxin started, sodium dropped to 129.     Subjective:  Patient without any new complaints.  Feels like  she is doing okay.    Objective: Vitals:   08/02/22 1200 08/02/22 1300 08/02/22 1500 08/02/22 1518  BP:  127/83  (!) 134/92  Pulse:   98 98  Resp: 18 (!) 40 18 19  Temp:    98.3 F (36.8 C)  TempSrc:    Oral  SpO2:   97% 100%  Weight:      Height:        Intake/Output Summary (Last 24 hours) at 08/02/2022 1621 Last data filed at 08/02/2022 1500 Gross per 24 hour  Intake 1252.77 ml  Output 4900 ml  Net -3647.23 ml    Filed Weights   07/31/22 0345 08/01/22 0825 08/02/22 0439  Weight: 111.8 kg 85.6 kg 94 kg     Exam:  General: Patient lying in bed watching TV in NAD Eyes: sclera anicteric, conjuctiva mild injection bilaterally CVS: S1-S2, regular  Respiratory:  decreased air entry bilaterally secondary to decreased inspiratory effort, rales at bases  GI: NABS, soft, NT  LE: Warm and well-perfused  Data Reviewed:  Basic Metabolic Panel: Recent Labs  Lab 07/29/22 0417 07/29/22 2242 07/30/22 0521 07/30/22 1216 07/31/22 0343 08/01/22 0542 08/02/22 0436  NA 135   < > 136 133* 137 139 129*  K 3.9   < > 3.9 3.9 3.7 3.6 3.8  CL 97*   < > 94* 87* 89* 95* 89*  CO2 31   < > 33* 33* 34* 35* 31  GLUCOSE 229*   < > 214* 307* 197* 141* 438*  BUN 28*   < > 28* 27* 30* 35* 35*  CREATININE 1.11*   < > 1.21* 1.13* 1.29* 1.26* 1.22*  CALCIUM 6.9*   < >  7.7* 8.1* 8.3* 8.5* 8.0*  MG 1.6*  --  1.6*  --  2.3 2.1 1.9  PHOS 4.3  --   --   --   --   --   --    < > = values in this interval not displayed.     CBC: Recent Labs  Lab 07/28/22 1013 07/29/22 0417 08/01/22 0542  WBC  --  5.0 6.3  HGB 10.5* 8.7* 10.7*  HCT 31.0* 30.6* 38.3  MCV  --  84.1 85.7  PLT  --  356 463*      Scheduled Meds:  calcium carbonate  1 tablet Oral TID WC   carvedilol  3.125 mg Oral BID WC   Chlorhexidine Gluconate Cloth  6 each Topical Daily   dapagliflozin propanediol  10 mg Oral Daily   digoxin  0.125 mg Oral Daily   famotidine  20 mg Oral BID   ferrous sulfate  325 mg Oral Q  breakfast   insulin aspart  0-5 Units Subcutaneous QHS   insulin aspart  0-9 Units Subcutaneous TID WC   insulin glargine-yfgn  20 Units Subcutaneous BID   sacubitril-valsartan  1 tablet Oral BID   sodium chloride flush  10-40 mL Intracatheter Q12H   sodium chloride flush  3 mL Intravenous Q12H   sodium chloride flush  3 mL Intravenous Q12H   spironolactone  25 mg Oral Daily   Vitamin D (Ergocalciferol)  50,000 Units Oral Q7 days   Continuous Infusions:  sodium chloride     milrinone 0.125 mcg/kg/min (08/02/22 1500)     Assessment & Plan:   Decompensated HFrEF Sodium down to 129 today Patient was seen by Dr. Haroldine Laws who recommended stopping Lasix drip and starting p.o. torsemide.  Carvedilol/Farxiga/spironolactone/Entresto continued. Digoxin was added.   DM 2 Blood sugars are improved on glargine increased to 20u BID Continue Farxiga and SSI  CKD 2 Continue to follow creatinine as we diurese Recently at baseline  Hypomagnesemia Resolved with repletion  Ongoing cocaine use Patient has been extensively counseled and is already suffering the ill effects given her nonischemic cardiomyopathy.      DVT prophylaxis: SCD--patient does not want to get Lovenox injections, agrees to SCD Code Status: Full     Studies: No results found.  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 Active Problems:   Type II diabetes mellitus with renal manifestations   Hypocalcemia   Dyslipidemia   Chronic kidney disease (CKD), stage II (mild)   GERD without esophagitis   Nonischemic cardiomyopathy   Acute on chronic congestive heart failure   Obesity (BMI 30-39.9)   Acute pulmonary edema   Cocaine abuse   Leg edema   Acute on chronic combined systolic and diastolic CHF (congestive heart failure)   NICM (nonischemic cardiomyopathy)     Astrid Vides Tublu Bowen Goyal, [facility]  If 7PM-7AM, please  contact night-coverage [email or web address]   LOS: 9 days

## 2022-08-02 NOTE — Inpatient Diabetes Management (Signed)
Inpatient Diabetes Program Recommendations  AACE/ADA: New Consensus Statement on Inpatient Glycemic Control (2015)  Target Ranges:  Prepandial:   less than 140 mg/dL      Peak postprandial:   less than 180 mg/dL (1-2 hours)      Critically ill patients:  140 - 180 mg/dL   Lab Results  Component Value Date   GLUCAP 262 (H) 08/02/2022   HGBA1C 9.6 (H) 02/10/2022    Review of Glycemic Control  Latest Reference Range & Units 08/01/22 07:55 08/01/22 11:56 08/01/22 16:14 08/01/22 21:10 08/02/22 07:31  Glucose-Capillary 70 - 99 mg/dL 128 (H) 243 (H) 322 (H) 320 (H) 262 (H)  (H): Data is abnormally high  Diabetes history: DM2 Outpatient Diabetes medications: Lantus 10 units QAM, 15 units QHS, Farxiga 10 mg QD (Not taking) Current orders for Inpatient glycemic control: Semglee 20 units BID, Novolog 0-9 units TID and 0-5 QHS, Farxiga 10 mg QD  Inpatient Diabetes Program Recommendations:    Please consider:  Novolog 3 units TID with meals if she consumes at least 50% Obtaining a current A1C  Will continue to follow while inpatient.  Thank you, Reche Dixon, MSN, [place] Diabetes Coordinator Inpatient Diabetes Program [phone] (team pager from 8a-5p)

## 2022-08-03 LAB — GLUCOSE, CAPILLARY
Glucose-Capillary: 156 mg/dL — ABNORMAL HIGH (ref 70–99)
Glucose-Capillary: 128 mg/dL — ABNORMAL HIGH (ref 70–99)
Glucose-Capillary: 285 mg/dL — ABNORMAL HIGH (ref 70–99)
Glucose-Capillary: 263 mg/dL — ABNORMAL HIGH (ref 70–99)
Glucose-Capillary: 437 mg/dL — ABNORMAL HIGH (ref 70–99)

## 2022-08-03 LAB — COOXEMETRY PANEL
Carboxyhemoglobin: 2 % — ABNORMAL HIGH (ref 0.5–1.5)
Methemoglobin: 1.3 % (ref 0.0–1.5)
O2 Saturation: 60.3 %
Total hemoglobin: 12.2 g/dL (ref 12.0–16.0)
Total oxygen content: 58.3 %

## 2022-08-03 LAB — BASIC METABOLIC PANEL
Anion gap: 7 (ref 5–15)
BUN: 37 mg/dL — ABNORMAL HIGH (ref 6–20)
CO2: 31 mmol/L (ref 22–32)
Calcium: 8.3 mg/dL — ABNORMAL LOW (ref 8.9–10.3)
Chloride: 96 mmol/L — ABNORMAL LOW (ref 98–111)
Creatinine, Ser: 1.21 mg/dL — ABNORMAL HIGH (ref 0.44–1.00)
GFR, Estimated: 59 mL/min — ABNORMAL LOW (ref >60)
Glucose, Bld: 280 mg/dL — ABNORMAL HIGH (ref 70–99)
Potassium: 3.7 mmol/L (ref 3.5–5.1)
Sodium: 134 mmol/L — ABNORMAL LOW (ref 135–145)

## 2022-08-03 LAB — GLUCOSE, RANDOM: Glucose, Bld: 559 mg/dL (ref 70–99)

## 2022-08-03 MED ORDER — INSULIN ASPART 100 UNIT/ML IJ SOLN
5.0000 [IU] | Freq: Once | INTRAMUSCULAR | Status: AC
  Administered 2022-08-03: 5 [IU] via SUBCUTANEOUS

## 2022-08-03 MED ORDER — TORSEMIDE 20 MG PO TABS
80.0000 mg | ORAL_TABLET | Freq: Every day | ORAL | Status: DC
  Administered 2022-08-03 – 2022-08-04 (×2): 80 mg via ORAL
  Filled 2022-08-03 (×2): qty 4

## 2022-08-03 NOTE — Progress Notes (Addendum)
   Heart Failure Nurse Navigator Note  Met with patient today, she remains in the ICU, sitting upright in bed in no acute distress.  In good spirits, she says" look at my skinny legs."  I then asked her what she is going to do to keep it that way?  Her response was "monitor my fluid intake, not eating fast food, eating healthy, taking her meds as ordered, weight daily and reporting weight gain, changes in symptoms.  Also discussed her cocaine use. "She states she realizes it is not good for her heart and needs to not use.  She also voices concern over the cost of her new medications, digoxin and spironolactone.  Told her I would message the pharmacist to give her an answer.  Message sent to [facility] with pharmacy.  We also discussed follow up with her cardiologist and with the heart failure clinic.  She states the last time she tried  to be seen at [facility] they wanted $100.00 (? Debt) and she states she does not have it.  She adds when she sees Otila Kluver there is not a co-pay.   She had no further questions.  Pricilla Riffle RN

## 2022-08-03 NOTE — Progress Notes (Signed)
  Transition of Care (TOC) Screening Note   Patient Details  Name: Molly Howard Date of Birth: 08/30/1985   Transition of Care [facility]) CM/SW Contact:    Tiburcio Bash, LCSW Phone Number: 08/03/2022, 1:49 PM    Transition of Care Department [facility]) has reviewed patient and no TOC needs have been identified at this time. We will continue to monitor patient advancement through interdisciplinary progression rounds. If new patient transition needs arise, please place a TOC consult.  Kelby Fam, [place], MSW, [place]

## 2022-08-03 NOTE — Progress Notes (Addendum)
[facility] NAME: Molly Howard    MR#:  [id number]  DATE OF BIRTH:  25-Nov-1985  SUBJECTIVE:   No family at bedside. Overall feeling better. Sats more than 90% on room air. Patient off IV Lasix drip. Tolerating PO diuretic. Urine output about 40 L total since admission   VITALS:  Blood pressure (!) 133/94, pulse 92, temperature 98.5 F (36.9 C), temperature source Oral, resp. rate 18, height 5\' 7"  (1.702 m), weight 93.7 kg, last menstrual period 07/19/2022, SpO2 100 %.  PHYSICAL EXAMINATION:   GENERAL:  37 y.o.-year-old patient with no acute distress.  LUNGS: Normal breath sounds bilaterally, no wheezing CARDIOVASCULAR: S1, S2 normal. No murmur   ABDOMEN: Soft, nontender, nondistended. Bowel sounds present.  EXTREMITIES: No  edema b/l.    NEUROLOGIC: nonfocal  patient is alert and awake SKIN: No obvious rash, lesion, or ulcer.   LABORATORY PANEL:  CBC Recent Labs  Lab 08/01/22 0542  WBC 6.3  HGB 10.7*  HCT 38.3  PLT 463*    Chemistries  Recent Labs  Lab 08/02/22 0436 08/03/22 0500  NA 129* 134*  K 3.8 3.7  CL 89* 96*  CO2 31 31  GLUCOSE 438* 280*  BUN 35* 37*  CREATININE 1.22* 1.21*  CALCIUM 8.0* 8.3*  MG 1.9  --    Assessment and Plan 37 year old female with history of nonischemic cardiomyopathy, DM2, ongoing cocaine use was admitted for decompensated HFrEF. EF is 25 to 30% with LV global hypokinesis from echo this admission. Patient admitted for IV diuresis.   Decompensated HFrEF in the setting of Polysubstance drug abuse Sodium improved to 134 Patient was seen by Dr. Haroldine Laws who recommended stopping Lasix drip and starting p.o. torsemide.  Carvedilol/Farxiga/spironolactone/Entresto continued. Digoxin was added. Uop about 40 liters Pt on RA    DM 2 with hyperglycemia Blood sugars are improved on glargine increased to 20u BID Continue Farxiga and SSI   CKD 2 Continue to follow creatinine as we  diurese Recently at baseline   Hypomagnesemia Resolved with repletion   Ongoing cocaine use Patient has been extensively counseled and is already suffering the ill effects given her nonischemic cardiomyopathy.     Overall improving. Will discharge to home tomorrow if remains stable.  Family communication :none Consults :[facility] cardiology CODE STATUS: full DVT Prophylaxis :SCD Level of care: Med-Surg Status is: Inpatient Remains inpatient appropriate because: monitor one more day and hoping to discharge tomorrow.    TOTAL TIME TAKING CARE OF THIS PATIENT: 35 minutes.  >50% time spent on counselling and coordination of care  Note: This dictation was prepared with Dragon dictation along with smaller phrase technology. Any transcriptional errors that result from this process are unintentional.  Fritzi Mandes M.D    [facility]   CC: Primary care physician; [name], DO

## 2022-08-03 NOTE — Inpatient Diabetes Management (Signed)
Inpatient Diabetes Program Recommendations  AACE/ADA: New Consensus Statement on Inpatient Glycemic Control (2015)  Target Ranges:  Prepandial:   less than 140 mg/dL      Peak postprandial:   less than 180 mg/dL (1-2 hours)      Critically ill patients:  140 - 180 mg/dL   Lab Results  Component Value Date   GLUCAP 285 (H) 08/03/2022   HGBA1C 9.6 (H) 02/10/2022    Review of Glycemic Control  Latest Reference Range & Units 08/02/22 07:31 08/02/22 11:36 08/02/22 16:44 08/02/22 21:39 08/03/22 06:19 08/03/22 07:31 08/03/22 11:35  Glucose-Capillary 70 - 99 mg/dL 262 (H) 293 (H) 308 (H) 534 (HH) 156 (H) 128 (H) 285 (H)  (HH): Data is critically high (H): Data is abnormally high  Diabetes history: DM2 Outpatient Diabetes medications: Lantus 10 units QAM, 15 units QHS, Farxiga 10 mg QD (Not taking) Current orders for Inpatient glycemic control: Semglee 20 units BID, Novolog 0-9 units TID and 0-5 QHS, Farxiga 10 mg QD   Inpatient Diabetes Program Recommendations:     Please consider:   Novolog 3 units TID with meals if she consumes at least 50%  Will continue to follow while inpatient.  Thank you, Reche Dixon, MSN, [place] Diabetes Coordinator Inpatient Diabetes Program [phone] (team pager from 8a-5p)

## 2022-08-03 NOTE — Progress Notes (Signed)
On call provider notification  2152  S:  POC glucose 437.  STAT lab draw glucose 559 at 2152.  Notify MD greater than 400   B:  25 F history significant for combined systolic and diastolic CHF (EF 123XX123), stage II CKD, DM, HTN, and nonischemic cardiomyopathy as well as for substance abuse, who presented to ED 3/23 with a [place]etti of worsening dyspnea with associated orthopnea, dyspnea on exertion paroxysmal nocturnal dyspnea for 3 days.  She admitted to BLE edema and 15 pound weight gain over one week.   Transferred from ICU this shift (Right heart cath 3/27 required milrinone gtt) now stable awaiting potential DC tomorrow.  \  A: HS Novolog sliding scale and schedule semglee 20 units not given yet.   R:  Awaiting further orders.  2200 response:  5U of novolog for a total of 10U

## 2022-08-03 NOTE — Progress Notes (Addendum)
Advanced Heart Failure Rounding Note   Subjective:    Milrinone and lasix gtt stopped yesterday. Feels good. Denies CP, SOB or orthopnea.  CVP 6 Co-ox 60%    Objective:   Weight Range:  Vital Signs:   Temp:  [97.7 F (36.5 C)-98.5 F (36.9 C)] 98.5 F (36.9 C) (04/02 0838) Pulse Rate:  [93-102] 98 (04/02 0840) Resp:  [15-40] 19 (04/02 1000) BP: (118-145)/(79-97) 122/79 (04/02 0840) SpO2:  [97 %-100 %] 99 % (04/02 0840) Weight:  [93.7 kg] 93.7 kg (04/02 0500) Last BM Date : 08/02/22  Weight change: Filed Weights   08/01/22 0825 08/02/22 0439 08/03/22 0500  Weight: 85.6 kg 94 kg 93.7 kg    Intake/Output:   Intake/Output Summary (Last 24 hours) at 08/03/2022 1126 Last data filed at 08/03/2022 0500 Gross per 24 hour  Intake 430.31 ml  Output 2850 ml  Net -2419.69 ml     Physical Exam: General:  Sitting up on side of bed No resp difficulty HEENT: normal Neck: supple. JVP 6. Carotids 2+ bilat; no bruits. No lymphadenopathy or thryomegaly appreciated. Cor: PMI nondisplaced. Regular rate & rhythm. 2/6 TR Lungs: clear Abdomen: soft, nontender, nondistended. No hepatosplenomegaly. No bruits or masses. Good bowel sounds. Extremities: no cyanosis, clubbing, rash, edema Neuro: alert & orientedx3, cranial nerves grossly intact. moves all 4 extremities w/o difficulty. Affect pleasant  Telemetry: Sinus 90s. Personally reviewed  Labs: Basic Metabolic Panel: Recent Labs  Lab 07/29/22 0417 07/29/22 2242 07/30/22 0521 07/30/22 1216 07/31/22 0343 08/01/22 0542 08/02/22 0436 08/03/22 0500  NA 135   < > 136 133* 137 139 129* 134*  K 3.9   < > 3.9 3.9 3.7 3.6 3.8 3.7  CL 97*   < > 94* 87* 89* 95* 89* 96*  CO2 31   < > 33* 33* 34* 35* 31 31  GLUCOSE 229*   < > 214* 307* 197* 141* 438* 280*  BUN 28*   < > 28* 27* 30* 35* 35* 37*  CREATININE 1.11*   < > 1.21* 1.13* 1.29* 1.26* 1.22* 1.21*  CALCIUM 6.9*   < > 7.7* 8.1* 8.3* 8.5* 8.0* 8.3*  MG 1.6*  --  1.6*  --  2.3  2.1 1.9  --   PHOS 4.3  --   --   --   --   --   --   --    < > = values in this interval not displayed.    Liver Function Tests: No results for input(s): "AST", "ALT", "ALKPHOS", "BILITOT", "PROT", "ALBUMIN" in the last 168 hours. No results for input(s): "LIPASE", "AMYLASE" in the last 168 hours. No results for input(s): "AMMONIA" in the last 168 hours.  CBC: Recent Labs  Lab 07/28/22 1013 07/29/22 0417 08/01/22 0542  WBC  --  5.0 6.3  HGB 10.5* 8.7* 10.7*  HCT 31.0* 30.6* 38.3  MCV  --  84.1 85.7  PLT  --  356 463*    Cardiac Enzymes: No results for input(s): "CKTOTAL", "CKMB", "CKMBINDEX", "TROPONINI" in the last 168 hours.  BNP: BNP (last 3 results) Recent Labs    01/15/22 1422 02/09/22 1214 07/24/22 1619  BNP 792.8* 859.3* 1,400.4*    ProBNP (last 3 results) No results for input(s): "PROBNP" in the last 8760 hours.    Other results:  Imaging: No results found.   Medications:     Scheduled Medications:  calcium carbonate  1 tablet Oral TID WC   carvedilol  3.125 mg Oral BID  WC   Chlorhexidine Gluconate Cloth  6 each Topical Daily   dapagliflozin propanediol  10 mg Oral Daily   digoxin  0.125 mg Oral Daily   famotidine  20 mg Oral BID   ferrous sulfate  325 mg Oral Q breakfast   insulin aspart  0-5 Units Subcutaneous QHS   insulin aspart  0-9 Units Subcutaneous TID WC   insulin glargine-yfgn  20 Units Subcutaneous BID   sacubitril-valsartan  1 tablet Oral BID   sodium chloride flush  10-40 mL Intracatheter Q12H   sodium chloride flush  3 mL Intravenous Q12H   sodium chloride flush  3 mL Intravenous Q12H   spironolactone  25 mg Oral Daily   Vitamin D (Ergocalciferol)  50,000 Units Oral Q7 days    Infusions:  sodium chloride      PRN Medications: sodium chloride, acetaminophen **OR** acetaminophen, albuterol, benzonatate, magnesium hydroxide, ondansetron **OR** ondansetron (ZOFRAN) IV, mouth rinse, sodium chloride flush, sodium chloride  flush, traZODone   Assessment/Plan:   1.  Acute on chronic systolic heart failure with biventricular HF/shock - Echo 1/22 EF 20-25% in setting of COVID - cMRI 1/23 EF 42% RV 50% No LGE.  - EF 25 to 30% by echo in May 2023 with grade 2 diastolic dysfunction.   Admitted October 2023, admitted for diuresis. Dry weight 101.5 kg (223 lbs)  - Echo 07/25/22 LVEF 25-30% RV mildly down Severe TR. RVSP 56mmhG Moderate pericardial effusion  - RHC 07/28/22: RA 30 PA 54/30 (41) PW 34 mmHg PA sat 42% Fick 4.5/2.0 PAPI:0.8 - Dry weight in October 2023 was 101.5 kg.   - Milrinone stopped 4/1. Co-ox stable at 60%  - Volume status looks good.  - Resume torsemide 80 daily - Continue carvedilol 3.125 bi - Continue farxiga 10  - Continue spiro 25 - Continue Entresto  - Continue digoxin 0.125 - She currently has end-stage biventricular HF likely due to ongoing polysubstance abuse. Stressed need for med compliance and avoidance of drug use. Currently not candidate for advanced therapies  - Will follow co-ox one more day off milrinone. If stable, can likely go home tomorrow. Long discussion about severity of her HF and potential need for advanced therapies if not improving but that she would need to be complaints with meds and off drugs to qualify - ok to go to floor from my perspective  2. Hyponatremia - improved 129 -> 134 - restrict FW   3. Hypokalemia/hypomag - supp   4.  Essential hypertension:  - meds as above   5.  Type 2 diabetes mellitus:  - Poorly controlled.  A1c was 9.6 in October.  Continue Farxiga.  Management per internal medicine. - Recheck HGBa1c    6.  Medication nonadherence:  - reports decent compliance - will need close f/u in HF clinic   7.  Polysubstance abuse:  - Notes that she had been using cocaine every other day until her mother passed in December.  Says she last used 1 month ago.  Tox screen positive this admi     Length of Stay: [place] 08/03/2022,  11:26 AM  Advanced Heart Failure Team Pager [phone] (M-F; 7a - 4p)  Please contact [facility] for night-coverage after hours (4p -7a ) and weekends on [email or web address]

## 2022-08-03 NOTE — Progress Notes (Cosign Needed Addendum)
Patient ambulated 3 laps around ICU with RN. All vitals WNL. Patient tolerated activity well.

## 2022-08-04 DIAGNOSIS — N182 Chronic kidney disease, stage 2 (mild): Secondary | ICD-10-CM | POA: Diagnosis not present

## 2022-08-04 DIAGNOSIS — E1129 Type 2 diabetes mellitus with other diabetic kidney complication: Secondary | ICD-10-CM | POA: Diagnosis not present

## 2022-08-04 DIAGNOSIS — F1410 Cocaine abuse, uncomplicated: Secondary | ICD-10-CM | POA: Diagnosis not present

## 2022-08-04 DIAGNOSIS — I5043 Acute on chronic combined systolic (congestive) and diastolic (congestive) heart failure: Secondary | ICD-10-CM | POA: Diagnosis not present

## 2022-08-04 LAB — BASIC METABOLIC PANEL
Anion gap: 7 (ref 5–15)
BUN: 48 mg/dL — ABNORMAL HIGH (ref 6–20)
CO2: 33 mmol/L — ABNORMAL HIGH (ref 22–32)
Calcium: 8.7 mg/dL — ABNORMAL LOW (ref 8.9–10.3)
Chloride: 99 mmol/L (ref 98–111)
Creatinine, Ser: 1.52 mg/dL — ABNORMAL HIGH (ref 0.44–1.00)
GFR, Estimated: 45 mL/min — ABNORMAL LOW (ref >60)
Glucose, Bld: 226 mg/dL — ABNORMAL HIGH (ref 70–99)
Potassium: 3.9 mmol/L (ref 3.5–5.1)
Sodium: 139 mmol/L (ref 135–145)

## 2022-08-04 LAB — COOXEMETRY PANEL
Carboxyhemoglobin: 1.9 % — ABNORMAL HIGH (ref 0.5–1.5)
Methemoglobin: <0.7 % (ref 0.0–1.5)
O2 Saturation: 63.4 %
Total hemoglobin: 11.7 g/dL — ABNORMAL LOW (ref 12.0–16.0)
Total oxygen content: 61.8 %

## 2022-08-04 LAB — MAGNESIUM: Magnesium: 2.4 mg/dL (ref 1.7–2.4)

## 2022-08-04 LAB — HEMOGLOBIN A1C
Hgb A1c MFr Bld: 10.1 % — ABNORMAL HIGH (ref 4.8–5.6)
Mean Plasma Glucose: 243 mg/dL

## 2022-08-04 LAB — PHOSPHORUS: Phosphorus: 7.3 mg/dL — ABNORMAL HIGH (ref 2.5–4.6)

## 2022-08-04 LAB — GLUCOSE, CAPILLARY: Glucose-Capillary: 177 mg/dL — ABNORMAL HIGH (ref 70–99)

## 2022-08-04 MED ORDER — SPIRONOLACTONE 25 MG PO TABS: 25.0000 mg | ORAL_TABLET | Freq: Every day | ORAL | 2 refills | Status: DC

## 2022-08-04 MED ORDER — DAPAGLIFLOZIN PROPANEDIOL 10 MG PO TABS: 10.0000 mg | ORAL_TABLET | Freq: Every day | ORAL | 2 refills | Status: DC

## 2022-08-04 MED ORDER — LANTUS SOLOSTAR 100 UNIT/ML ~~LOC~~ SOPN
20.0000 [IU] | PEN_INJECTOR | Freq: Two times a day (BID) | SUBCUTANEOUS | 2 refills | Status: DC
Start: 2022-08-04 — End: 2022-10-04

## 2022-08-04 MED ORDER — ATORVASTATIN CALCIUM 20 MG PO TABS: 20.0000 mg | ORAL_TABLET | Freq: Every day | ORAL | 3 refills | Status: DC

## 2022-08-04 MED ORDER — SACUBITRIL-VALSARTAN 49-51 MG PO TABS: 1.0000 | ORAL_TABLET | Freq: Two times a day (BID) | ORAL | 2 refills | Status: DC

## 2022-08-04 MED ORDER — CALCIUM CARBONATE 1250 (500 CA) MG PO TABS: 1.0000 | ORAL_TABLET | Freq: Three times a day (TID) | ORAL | 0 refills | Status: DC

## 2022-08-04 MED ORDER — FERROUS SULFATE 325 (65 FE) MG PO TABS: 325.0000 mg | ORAL_TABLET | Freq: Every day | ORAL | 0 refills | Status: DC

## 2022-08-04 MED ORDER — TORSEMIDE 20 MG PO TABS: 80.0000 mg | ORAL_TABLET | Freq: Every day | ORAL | 1 refills | Status: DC

## 2022-08-04 MED ORDER — DIGOXIN 125 MCG PO TABS: 0.1250 mg | ORAL_TABLET | Freq: Every day | ORAL | 2 refills | Status: DC

## 2022-08-04 MED ORDER — CARVEDILOL 3.125 MG PO TABS: 3.1250 mg | ORAL_TABLET | Freq: Two times a day (BID) | ORAL | 2 refills | Status: DC

## 2022-08-04 NOTE — Discharge Summary (Signed)
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 EF is 25 to 30% with LV global hypokinesis from echo this admission. Patient admitted for IV diuresis.    Decompensated HFrEF in the setting of Polysubstance drug abuse --Patient was seen by Dr. Haroldine Laws who recommended stopping Lasix drip and starting p.o. torsemide.  --pt was on milrinone gtt --cont  Carvedilol/Farxiga/spironolactone/Entresto --rx sent to her Pharmacy --Digoxin was added. --Uop about 41 liters with decrease in weight 116 kg >>91 kg --Pt on RA --Co-ox hgb stable    DM 2 with hyperglycemia --Blood sugars are improved on glargine increased to 20u BID --Continue Farxiga and SSI   CKD 2 --Continue to follow creatinine as we diurese --Recently at baseline   Hypomagnesemia --Resolved with repletion   Ongoing cocaine use --Patient has been extensively counseled and is already suffering the ill effects  given her nonischemic cardiomyopathy.     Overall improving. Will discharge to home today   Family communication :none Consults :[place] cardiology CODE STATUS: full       Consultants: [facility] cardiology Disposition: Home Diet recommendation:  Cardiac and Carb modified diet DISCHARGE MEDICATION: Allergies as of 08/04/2022       Reactions   No [employer] Food Allergies Rash, Other (See Comments)   Lemons        Medication List     STOP taking these medications    albuterol 108 (90 Base) MCG/ACT inhaler Commonly known as: VENTOLIN HFA   benzonatate 100 MG capsule Commonly known as: Tessalon Perles   chlorpheniramine-HYDROcodone 10-8 MG/5ML Commonly known as: TUSSIONEX   Entresto 97-103 MG Generic drug: sacubitril-valsartan Replaced by: sacubitril-valsartan 49-51 MG   famotidine 20 MG tablet Commonly known as: Pepcid   HYDROcodone-acetaminophen 5-325 MG tablet Commonly known as: NORCO/VICODIN   norethindrone 5 MG tablet Commonly known as: AYGESTIN   ondansetron 4 MG disintegrating tablet Commonly known as: ZOFRAN-ODT   potassium chloride SA 20 MEQ tablet Commonly known as: KLOR-CON M       TAKE these medications    atorvastatin 20 MG tablet Commonly known as: LIPITOR Take 1 tablet (20 mg total) by mouth daily.   calcium carbonate 1250 (500 Ca) MG tablet Commonly known as: OS-CAL - dosed in mg of elemental calcium Take 1 tablet (1,250 mg total) by mouth 3 (three) times daily with meals.   carvedilol 3.125 MG tablet Commonly known as: COREG Take 1 tablet (3.125 mg total) by mouth 2 (two) times daily with a meal. What changed:  medication strength how much to take   dapagliflozin propanediol 10 MG Tabs tablet Commonly known as: FARXIGA Take 1 tablet (10 mg total) by mouth once daily.   digoxin 0.125 MG tablet Commonly known as: LANOXIN Take 1 tablet (0.125 mg total) by mouth daily. Start taking on: August 05, 2022   ferrous sulfate 325 (65 FE)  MG tablet Take 1 tablet (325 mg total) by mouth daily with breakfast.   Lantus SoloStar 100 UNIT/ML Solostar Pen Generic drug: insulin glargine Inject 20 Units into the skin 2 (two) times daily. Take 10 units in the morning and 15 in the evenings. What changed:  how much to take how to take this when to take this   sacubitril-valsartan 49-51 MG Commonly known as: ENTRESTO Take 1 tablet by mouth 2 (two) times daily. Replaces: Entresto 97-103 MG   spironolactone 25 MG tablet Commonly known as: ALDACTONE Take 1 tablet (25 mg total) by mouth daily. Start taking on: August 05, 2022 What changed: how much to take   torsemide 20 MG tablet Commonly known as: DEMADEX Take 4 tablets (80 mg total) by mouth daily. What changed:  medication strength how much to take when to take this        Follow-up Information     [name], DO. Schedule an appointment as soon as possible for a visit in 1 week(s).   Specialty: Internal Medicine Contact information: [address] [place] [address] [phone]         [place] Follow up on 08/10/2022.   Specialty: Cardiology Why: for CHF f/u Contact information: [address] [phone]        Bensimhon, Shaune Pascal, MD. Schedule an appointment as soon as possible for a visit in 1 week(s).   Specialty: Cardiology Why: CHF f/u Contact information: [place] [address] [phone]                 Rew Weights   08/02/22 0439 08/03/22 0500 08/04/22 0500  Weight: 94 kg 93.7 kg 91.8 kg     Condition at discharge: fair  The results of significant diagnostics from this hospitalization (including imaging, microbiology, ancillary and laboratory) are listed below for reference.   Imaging Studies: [place] EKG SITE RITE  Result Date: 07/28/2022 If Site Rite image not  attached, placement could not be confirmed due to current cardiac rhythm.  CARDIAC CATHETERIZATION  Result Date: 07/28/2022 Successful right heart catheterization via the right antecubital vein. Severely elevated right and left-sided filling pressures, moderate pulmonary hypertension and moderately reduced cardiac output. RA: 30 mmHg RV: 56/14 mmHg PW: 34 mmHg PA: 54/30 with a mean of 41 mmHg PA sat is 42% Cardiac output: 4.47 with a cardiac index of 2. CPO is 0.89 PAPI: 0.8 Recommendations: The patient is severely volume overloaded with evidence of biventricular failure right more than left by hemodynamics. Recommend starting milrinone drip and furosemide drip.  Will transfer to stepdown unit.   ECHOCARDIOGRAM COMPLETE  Result Date: 07/25/2022    ECHOCARDIOGRAM REPORT   Patient Name:   DUANE DEBOISE Date of Exam: 07/25/2022 Medical Rec #:  [id number]     Height:       67.0 in Accession #:    [id number]    Weight:       250.0 lb Date of Birth:  09-Sep-1985     BSA:  2.223 m Patient Age:    7 years      BP:           99/63 mmHg Patient Gender: F             HR:           97 bpm. Exam Location:  [facility] Procedure: 2D Echo Indications:     Cardiomegaly I51.7  History:         Patient has prior history of Echocardiogram examinations.  Sonographer:     Wayland Salinas RDCS Referring Phys:  [place] Phys: Ida Rogue MD IMPRESSIONS  1. Left ventricular ejection fraction, by estimation, is 25 to 30%. Left ventricular ejection fraction by 2D MOD biplane is 21.6 %. The left ventricle has severely decreased function. The left ventricle demonstrates global hypokinesis. Left ventricular diastolic parameters are indeterminate.  2. Right ventricular systolic function is mildly reduced. The right ventricular size is normal. There is moderately elevated pulmonary artery systolic pressure. The estimated right ventricular systolic pressure is [id number] mmHg.  3. Left atrial size was mildly dilated.  4. A  small pericardial effusion is present. The pericardial effusion is circumferential. There is no evidence of cardiac tamponade.  5. The aortic valve is tricuspid. Aortic valve regurgitation is not visualized. No aortic stenosis is present.  6. The mitral valve is normal in structure. Mild mitral valve regurgitation. No evidence of mitral stenosis.  7. Tricuspid valve regurgitation is moderate to severe. FINDINGS  Left Ventricle: Left ventricular ejection fraction, by estimation, is 25 to 30%. Left ventricular ejection fraction by 2D MOD biplane is 21.6 %. The left ventricle has severely decreased function. The left ventricle demonstrates global hypokinesis. The left ventricular internal cavity size was normal in size. There is no left ventricular hypertrophy. Left ventricular diastolic parameters are indeterminate. Right Ventricle: The right ventricular size is normal. No increase in right ventricular wall thickness. Right ventricular systolic function is mildly reduced. There is moderately elevated pulmonary artery systolic pressure. The tricuspid regurgitant velocity is 3.19 m/s, and with an assumed right atrial pressure of 10 mmHg, the estimated right ventricular systolic pressure is [id number] mmHg. Left Atrium: Left atrial size was mildly dilated. Right Atrium: Right atrial size was normal in size. Pericardium: A small pericardial effusion is present. The pericardial effusion is circumferential. There is no evidence of cardiac tamponade. Mitral Valve: The mitral valve is normal in structure. There is mild calcification of the mitral valve leaflet(s). Mild mitral valve regurgitation. No evidence of mitral valve stenosis. Tricuspid Valve: The tricuspid valve is normal in structure. Tricuspid valve regurgitation is moderate to severe. No evidence of tricuspid stenosis. Aortic Valve: The aortic valve is tricuspid. Aortic valve regurgitation is not visualized. No aortic stenosis is present. Aortic valve peak gradient  measures 8.4 mmHg. Pulmonic Valve: The pulmonic valve was normal in structure. Pulmonic valve regurgitation is mild. No evidence of pulmonic stenosis. Aorta: The aortic root is normal in size and structure. Venous: The inferior vena cava was not well visualized. IAS/Shunts: No atrial level shunt detected by color flow Doppler.  LEFT VENTRICLE PLAX 2D                        Biplane EF (MOD) LVIDd:         5.40 cm         LV Biplane EF:   Left LVIDs:         4.80 cm  ventricular LV PW:         1.20 cm                          ejection LV IVS:        1.30 cm                          fraction by LVOT diam:     2.10 cm                          2D MOD LV SV:         55                               biplane is LV SV Index:   25                               21.6 %. LVOT Area:     3.46 cm  LV Volumes (MOD) LV vol d, MOD    135.0 ml A2C:                           3D Volume EF: LV vol d, MOD    123.0 ml      3D EF:        33 % A4C:                           LV EDV:       222 ml LV vol s, MOD    116.0 ml      LV ESV:       149 ml A2C:                           LV SV:        73 ml LV vol s, MOD    89.5 ml A4C: LV SV MOD A2C:   19.0 ml LV SV MOD A4C:   123.0 ml LV SV MOD BP:    28.1 ml RIGHT VENTRICLE RV Basal diam:  4.30 cm RV S prime:     19.90 cm/s TAPSE (M-mode): 1.7 cm LEFT ATRIUM             Index        RIGHT ATRIUM           Index LA diam:        4.30 cm 1.93 cm/m   RA Area:     26.10 cm LA Vol (A2C):   88.8 ml 39.94 ml/m  RA Volume:   95.30 ml  42.86 ml/m LA Vol (A4C):   72.8 ml 32.74 ml/m LA Biplane Vol: 82.1 ml 36.93 ml/m  AORTIC VALVE                 PULMONIC VALVE AV Area (Vmax): 2.39 cm     PV Vmax:        0.87 m/s AV Vmax:        144.50 cm/s  PV Peak grad:   3.0 mmHg AV Peak Grad:   8.4 mmHg     RVOT Peak grad: 1 mmHg LVOT Vmax:  99.90 cm/s LVOT Vmean:     65.350 cm/s LVOT VTI:       0.158 m  AORTA Ao Root diam: 2.80 cm MV E velocity: 123.00 cm/s  TRICUSPID VALVE MV A velocity:  46.30 cm/s   TR Peak grad:   40.7 mmHg MV E/A ratio:  2.66         TR Vmax:        319.00 cm/s                              SHUNTS                             Systemic VTI:  0.16 m                             Systemic Diam: 2.10 cm Ida Rogue MD Electronically signed by Ida Rogue MD Signature Date/Time: 07/25/2022/12:43:03 PM    Final    DG Chest 2 View  Result Date: 07/24/2022 CLINICAL DATA:  CHF EXAM: CHEST - 2 VIEW COMPARISON:  Chest x-ray 02/09/2022 FINDINGS: Enlarged cardiac silhouette. No pericardial effusion identified on lateral view. Otherwise the heart and mediastinal contours are within normal limits. No focal consolidation. Left mid lung zone linear atelectasis versus scarring. Interval increased interstitial markings. No pleural effusion. No pneumothorax. No acute osseous abnormality. IMPRESSION: Cardiomegaly with pulmonary edema. Electronically Signed   By: Iven Finn M.D.   On: 07/24/2022 17:04    Microbiology: Results for orders placed or performed during the hospital encounter of 07/24/22  MRSA Next Gen by PCR, Nasal     Status: None   Collection Time: 07/28/22 11:45 AM   Specimen: Nasal Mucosa; Nasal Swab  Result Value Ref Range Status   MRSA by PCR Next Gen NOT DETECTED NOT DETECTED Final    Comment: (NOTE) The GeneXpert MRSA Assay (FDA approved for NASAL specimens only), is one component of a comprehensive MRSA colonization surveillance program. It is not intended to diagnose MRSA infection nor to guide or monitor treatment for MRSA infections. Test performance is not FDA approved in patients less than 38 years old. Performed at [facility], [address] [place], [place] [address]     Labs: CBC: Recent Labs  Lab 07/29/22 0417 08/01/22 0542  WBC 5.0 6.3  HGB 8.7* 10.7*  HCT 30.6* 38.3  MCV 84.1 85.7  PLT 356 [id number]*   Basic Metabolic Panel: Recent Labs  Lab 07/29/22 0417 07/29/22 2242 07/30/22 0521 07/30/22 1216 07/31/22 0343  08/01/22 0542 08/02/22 0436 08/03/22 0500 08/03/22 2122 08/04/22 0540  NA 135   < > 136   < > 137 139 129* 134*  --  139  K 3.9   < > 3.9   < > 3.7 3.6 3.8 3.7  --  3.9  CL 97*   < > 94*   < > 89* 95* 89* 96*  --  99  CO2 31   < > 33*   < > 34* 35* 31 31  --  33*  GLUCOSE 229*   < > 214*   < > 197* 141* 438* 280* 559* 226*  BUN 28*   < > 28*   < > 30* 35* 35* 37*  --  48*  CREATININE 1.11*   < > 1.21*   < > 1.29* 1.26* 1.22*  1.21*  --  1.52*  CALCIUM 6.9*   < > 7.7*   < > 8.3* 8.5* 8.0* 8.3*  --  8.7*  MG 1.6*  --  1.6*  --  2.3 2.1 1.9  --   --  2.4  PHOS 4.3  --   --   --   --   --   --   --   --  7.3*   < > = values in this interval not displayed.   Liver Function Tests: No results for input(s): "AST", "ALT", "ALKPHOS", "BILITOT", "PROT", "ALBUMIN" in the last 168 hours. CBG: Recent Labs  Lab 08/03/22 0731 08/03/22 1135 08/03/22 1642 08/03/22 2102 08/04/22 0750  GLUCAP 128* 285* 263* 437* 177*    Discharge time spent: greater than 30 minutes.  Signed: Fritzi Mandes, MD [facility] 08/04/2022

## 2022-08-04 NOTE — Progress Notes (Addendum)
Advanced Heart Failure Rounding Note   Subjective:    Milrinone and lasix gtt stopped 08/02/22  Doing well. Denies CP or SOB.   Co-ox 63% Good urine output on po torsemide   Scr 1.20 -> 1.5   Objective:   Weight Range:  Vital Signs:   Temp:  [97.4 F (36.3 C)-98.2 F (36.8 C)] 98.2 F (36.8 C) (04/03 0751) Pulse Rate:  [88-98] 94 (04/03 0751) Resp:  [16-25] 18 (04/03 0751) BP: (112-145)/(76-95) 112/76 (04/03 0751) SpO2:  [93 %-100 %] 100 % (04/03 0751) Weight:  [91.8 kg] 91.8 kg (04/03 0500) Last BM Date : 08/03/22 (per pt)  Weight change: Filed Weights   08/02/22 0439 08/03/22 0500 08/04/22 0500  Weight: 94 kg 93.7 kg 91.8 kg    Intake/Output:   Intake/Output Summary (Last 24 hours) at 08/04/2022 1041 Last data filed at 08/04/2022 1035 Gross per 24 hour  Intake 960 ml  Output 2100 ml  Net -1140 ml      Physical Exam: General:  Well appearing. No resp difficulty HEENT: normal Neck: supple. no JVD. Carotids 2+ bilat; no bruits. No lymphadenopathy or thryomegaly appreciated. Cor: PMI nondisplaced. Regular rate & rhythm. 2/6 TR Lungs: clear Abdomen: soft, nontender, nondistended. No hepatosplenomegaly. No bruits or masses. Good bowel sounds. Extremities: no cyanosis, clubbing, rash, edema Neuro: alert & orientedx3, cranial nerves grossly intact. moves all 4 extremities w/o difficulty. Affect pleasant   Telemetry: Sinus 90. Personally reviewed  Labs: Basic Metabolic Panel: Recent Labs  Lab 07/29/22 0417 07/29/22 2242 07/30/22 0521 07/30/22 1216 07/31/22 0343 08/01/22 0542 08/02/22 0436 08/03/22 0500 08/03/22 2122 08/04/22 0540  NA 135   < > 136   < > 137 139 129* 134*  --  139  K 3.9   < > 3.9   < > 3.7 3.6 3.8 3.7  --  3.9  CL 97*   < > 94*   < > 89* 95* 89* 96*  --  99  CO2 31   < > 33*   < > 34* 35* 31 31  --  33*  GLUCOSE 229*   < > 214*   < > 197* 141* 438* 280* 559* 226*  BUN 28*   < > 28*   < > 30* 35* 35* 37*  --  48*  CREATININE  1.11*   < > 1.21*   < > 1.29* 1.26* 1.22* 1.21*  --  1.52*  CALCIUM 6.9*   < > 7.7*   < > 8.3* 8.5* 8.0* 8.3*  --  8.7*  MG 1.6*  --  1.6*  --  2.3 2.1 1.9  --   --  2.4  PHOS 4.3  --   --   --   --   --   --   --   --  7.3*   < > = values in this interval not displayed.     Liver Function Tests: No results for input(s): "AST", "ALT", "ALKPHOS", "BILITOT", "PROT", "ALBUMIN" in the last 168 hours. No results for input(s): "LIPASE", "AMYLASE" in the last 168 hours. No results for input(s): "AMMONIA" in the last 168 hours.  CBC: Recent Labs  Lab 07/29/22 0417 08/01/22 0542  WBC 5.0 6.3  HGB 8.7* 10.7*  HCT 30.6* 38.3  MCV 84.1 85.7  PLT 356 463*     Cardiac Enzymes: No results for input(s): "CKTOTAL", "CKMB", "CKMBINDEX", "TROPONINI" in the last 168 hours.  BNP: BNP (last 3 results) Recent Labs  01/15/22 1422 02/09/22 1214 07/24/22 1619  BNP 792.8* 859.3* 1,400.4*     ProBNP (last 3 results) No results for input(s): "PROBNP" in the last 8760 hours.    Other results:  Imaging: No results found.   Medications:     Scheduled Medications:  calcium carbonate  1 tablet Oral TID WC   carvedilol  3.125 mg Oral BID WC   Chlorhexidine Gluconate Cloth  6 each Topical Daily   dapagliflozin propanediol  10 mg Oral Daily   digoxin  0.125 mg Oral Daily   famotidine  20 mg Oral BID   ferrous sulfate  325 mg Oral Q breakfast   insulin aspart  0-5 Units Subcutaneous QHS   insulin aspart  0-9 Units Subcutaneous TID WC   insulin glargine-yfgn  20 Units Subcutaneous BID   sacubitril-valsartan  1 tablet Oral BID   sodium chloride flush  10-40 mL Intracatheter Q12H   sodium chloride flush  3 mL Intravenous Q12H   sodium chloride flush  3 mL Intravenous Q12H   spironolactone  25 mg Oral Daily   torsemide  80 mg Oral Daily   Vitamin D (Ergocalciferol)  50,000 Units Oral Q7 days    Infusions:  sodium chloride      PRN Medications: sodium chloride, acetaminophen  **OR** acetaminophen, albuterol, benzonatate, magnesium hydroxide, ondansetron **OR** ondansetron (ZOFRAN) IV, mouth rinse, sodium chloride flush, sodium chloride flush, traZODone   Assessment/Plan:   1.  Acute on chronic systolic heart failure with biventricular HF/shock - Echo 1/22 EF 20-25% in setting of COVID - cMRI 1/23 EF 42% RV 50% No LGE.  - EF 25 to 30% by echo in May 2023 with grade 2 diastolic dysfunction.   Admitted October 2023, admitted for diuresis. Dry weight 101.5 kg (223 lbs)  - Echo 07/25/22 LVEF 25-30% RV mildly down Severe TR. RVSP 52mmhG Moderate pericardial effusion  - RHC 07/28/22: RA 30 PA 54/30 (41) PW 34 mmHg PA sat 42% Fick 4.5/2.0 PAPI:0.8 - Dry weight in October 2023 was 101.5 kg.   - Milrinone stopped 4/1. Co-ox stable at 63%  - Volume status looks low today. SCr up. Suspect over diuresed  - Resume torsemide 80 daily - Continue carvedilol 3.125 bi - Continue farxiga 10  - Continue spiro 25 - Continue Entresto  49/51 bid - Continue digoxin 0.125 - She currently has end-stage biventricular HF likely due to ongoing polysubstance abuse. Stressed need for med compliance and avoidance of drug use. Currently not candidate for advanced therapies  - Has mild AKI but otherwise stable from HF perspective. She can go home from HF perspective with close f/u in HF Clinic (We will arrange). Would hold torsemide today and cut back to 40 daily  Home HF meds: - Torsemide 40 daily - Continue carvedilol 3.125 bid - Continue farxiga 10  - Continue spiro 25 - Continue Entresto 49/51 bid - Continue digoxin 0.125   2. Hyponatremia - resolved 129 -> 134 -> 139 - restrict FW   3. Hypokalemia/hypomag - supp   4.  Essential hypertension:  - meds as above   5.  Type 2 diabetes mellitus:  - Poorly controlled.  A1c 10.1 - Continue Farxiga.   - Management per internal medicine.   6.  Medication nonadherence:  - reports decent compliance - will need close f/u in HF  clinic   7.  Polysubstance abuse:  - Notes that she had been using cocaine every other day until her mother passed in December.  Says  she last used 1 month ago.  Tox screen positive this admi     Length of Stay: [facility] 08/04/2022, 10:41 AM  Advanced Heart Failure Team Pager [phone] (M-F; 7a - 4p)  Please contact [facility] for night-coverage after hours (4p -7a ) and weekends on [email or web address]

## 2022-08-04 NOTE — Inpatient Diabetes Management (Signed)
Inpatient Diabetes Program Recommendations  AACE/ADA: New Consensus Statement on Inpatient Glycemic Control (2015)  Target Ranges:  Prepandial:   less than 140 mg/dL      Peak postprandial:   less than 180 mg/dL (1-2 hours)      Critically ill patients:  140 - 180 mg/dL   Lab Results  Component Value Date   GLUCAP 177 (H) 08/04/2022   HGBA1C 10.1 (H) 08/03/2022    Review of Glycemic Control  Latest Reference Range & Units 08/03/22 07:31 08/03/22 11:35 08/03/22 16:42 08/03/22 21:02 08/04/22 07:50  Glucose-Capillary 70 - 99 mg/dL 128 (H) 285 (H) 263 (H) 437 (H) 177 (H)  (H): Data is abnormally high  Diabetes history: DM2 Outpatient Diabetes medications: Lantus 10 units QAM, 15 units QHS, Farxiga 10 mg QD (Not taking) Current orders for Inpatient glycemic control: Semglee 20 units BID, Novolog 0-9 units TID and 0-5 QHS, Farxiga 10 mg QD   Inpatient Diabetes Program Recommendations:     Please consider:   Novolog 4 units TID with meals if she consumes at least 50%   Will continue to follow while inpatient.   Thank you, Reche Dixon, MSN, [facility] Coordinator Inpatient Diabetes Program [phone] (team pager from 8a-5p)

## 2022-08-08 NOTE — Progress Notes (Deleted)
Established Patient Office Visit  Subjective:  Patient ID: Molly Howard, female    DOB: 12-12-1985  Age: 37 y.o. MRN: [id number]  CC:  No chief complaint on file.   HPI Molly Howard presents for hospital follow up.   Discharge Date: 07/24/22 Diagnosis: decompensated HFrEF in the setting of polysubstance drug abuse Procedures/tests: Echo EF 25-30%, cardiac catheterization  Consultants: Cardiology New medications: Digoxin, Lantus increased to 20 units BID Discontinued medications: None ? Discharge instructions:  Follow up with CHF clinic 4/9 and Cardiology Status: {Blank multiple:19196::"better","worse","stable","fluctuating"}   Diabetes Type 2: -First diagnosed when she was a kid and told she was a type 1, but then told she was a type 2 later in life so uncertain  -Last A1c 7/23 10.5 -Medications: Fargixa 10 mg, Lantus increased to 20 units BID while inpatient with SS -Patient is compliant with the above medications and reports no side effects.  -Checking BG at home: fasting 250 average, evening 150, went to 70's in the afternoon once a few days ago  -Diet: recently cut out fried foods, tries to eat fruit for snacks but has been drinking Min[place]id Orange Juice  -Exercise: tries to walk but gets short of breath with the heart failure  -Eye exam: April 22 scheduled but did not go - due  -Foot exam: UTD, saw Podiatrist and had nails cut  -Microalbumin: elevated, on Entresto and Farxiga  -Statin: yes -PNA vaccine: Prevnar 23 in 7/22 -Denies symptoms of hypoglycemia, polyuria, polydipsia, numbness extremities, foot ulcers/trauma.   Hypertension/HFrEF/NICM: -Medications: Torsemide 30 mg once daily, Carvedilol 6.25 mg, Spirnolactone 25 mg, Entresto and KCl 10 and Digoxin now added -Patient is compliant with above medications and reports no side effects. -Denies any SOB, CP, vision changes, LE edema or symptoms of hypotension -Follows with Cardiology note reviewed from  01/15/22 -Planning for subcutaneous ICD but hemoglobin low  -Last echo 09/18/21 showing reduced EF at 25-30% with global hypokinesis of the left ventricle  -She does endorse today some presyncopal and syncopal episodes.  She states she is passed out twice in the last several months, last of which was last month.  She states she was standing when she heard a "whooshing" sound in her ears and she lost consciousness.  She fell forward onto her face.  Her mother and sister were there to help her, patient states she was unconscious for 5 to 10 minutes.  The patient states that she does feel unsteady when she stands up and lightheaded when standing at times.  HLD: -Medications: Lipitor 20 mg for one year -Patient is compliant with above medications and reports no side effects.  -Last lipid panel: Lipid Panel     Component Value Date/Time   CHOL 216 (H) 05/27/2022 1022   TRIG 124 05/27/2022 1022   HDL 46 05/27/2022 1022   CHOLHDL 4.7 05/27/2022 1022   VLDL 25 05/27/2022 1022   LDLCALC 145 (H) 05/27/2022 1022   LDLCALC 171 (H) 06/12/2021 1354   GERD: -Not currently on daily medications, taking Pepcid as needed   CKD: -Last creatinine 9/23 1.33, GFR 53   Anemia: -Hgb 10.3 in 2/23, has menorrhagia, referred to Gyn who did Pap and vaginal [place]. US showing endometrial polyp and she was given an OCP in March but she has not started it or picked it up from the pharmacy yet.  - last hgb 7/23 8.6% -Having a period once a month - will bleed for 7 days, light for 3 days then heavy  the last 4 days. Has to change pads/tampons every 1 hour on heavy days. Does have clots.   -Had been seeing gynecology and was prescribed an OCP, however the patient states she cannot afford this medication -She stating now that her menstrual cycles are more manageable.  Her LMP was a few days ago.  She states her bleeding is much lighter and more tolerable.  Her usual menstrual cycle lasts about 1 week.  Past Medical History:   Diagnosis Date   Acid reflux    Chronic HFrEF (heart failure with reduced ejection fraction) (HCC)    a. 10/2019 Echo: EF 35-40%, GrII DD; b. 05/2020 Echo: EF 20-25%, glob HK; c. 10/2020 Echo: EF 30-35%, glob HK. GrII DD, Mildly red RV fxn. Mod TR; d.  05/2021 cMRI: EF 42%, no LGE. Nl RV size/fxn.   CKD (chronic kidney disease), stage II    Diabetes mellitus (HCC)    H/O medication noncompliance    Hypertension    Microcytic anemia    NICM (nonischemic cardiomyopathy) (HCC)    a. 10/2019 Echo: EF 35-40%; b. 10/2019 MV: No ischemia. Small apical defect-->breast attenuation; c. 05/2020 Echo: EF 20-25%; d. 10/2020 Echo: EF 30-35%, glob HK. GrII DD, Mildly red RV fxn. Mod TR; e. 05/2021 cMRI: EF 42%, no LGE. Nl RV size/fxn.   Obesity    Polysubstance abuse [facility])     Past Surgical History:  Procedure Laterality Date   CHOLECYSTECTOMY     HERNIA REPAIR     RIGHT HEART CATH N/A 07/28/2022   Procedure: RIGHT HEART CATH;  Surgeon: Iran Ouch, MD;  Location: [facility] INVASIVE CV LAB;  Service: Cardiovascular;  Laterality: N/A;    Family History  Problem Relation Age of Onset   Heart failure Mother        Onset of heart failure 40s.  Died in May 07, 2022.   Diabetes Mother    Hypertension Father    Diabetes Father     Social History   Socioeconomic History   Marital status: Single    Spouse name: Not on file   Number of children: Not on file   Years of education: Not on file   Highest education level: Not on file  Occupational History   Not on file  Tobacco Use   Smoking status: Former    Packs/day: .5    Types: Cigarettes    Quit date: 2022    Years since quitting: 2.2   Smokeless tobacco: Never  Vaping Use   Vaping Use: Never used  Substance and Sexual Activity   Alcohol use: Not Currently    Comment: ~ 4 shots/day on weekends only   Drug use: Not Currently    Types: Cocaine    Comment: Admits to using cocaine up and will February 2024.   Sexual activity: Not Currently     Birth control/protection: None  Other Topics Concern   Not on file  Social History Narrative   Lives locally.  Does not routinely exercise.  Has been using cocaine.   Social Determinants of Health   Financial Resource Strain: High Risk (06/16/2020)   Overall Financial Resource Strain (CARDIA)    Difficulty of Paying Living Expenses: Hard  Food Insecurity: No Food Insecurity (07/25/2022)   Hunger Vital Sign    Worried About Running Out of Food in the Last Year: Never true    Ran Out of Food in the Last Year: Never true  Transportation Needs: No Transportation Needs (07/25/2022)   PRAPARE -  Administrator, Civil Service (Medical): No    Lack of Transportation (Non-Medical): No  Physical Activity: Not on file  Stress: Not on file  Social Connections: Not on file  Intimate Partner Violence: Not At Risk (07/25/2022)   Humiliation, Afraid, Rape, and Kick questionnaire    Fear of Current or Ex-Partner: No    Emotionally Abused: No    Physically Abused: No    Sexually Abused: No    Outpatient Medications Prior to Visit  Medication Sig Dispense Refill   atorvastatin (LIPITOR) 20 MG tablet Take 1 tablet (20 mg total) by mouth daily. 90 tablet 3   calcium carbonate (OS-CAL - DOSED IN MG OF ELEMENTAL CALCIUM) 1250 (500 Ca) MG tablet Take 1 tablet (1,250 mg total) by mouth 3 (three) times daily with meals. 60 tablet 0   carvedilol (COREG) 3.125 MG tablet Take 1 tablet (3.125 mg total) by mouth 2 (two) times daily with a meal. 60 tablet 2   dapagliflozin propanediol (FARXIGA) 10 MG TABS tablet Take 1 tablet (10 mg total) by mouth once daily. 90 tablet 2   digoxin (LANOXIN) 0.125 MG tablet Take 1 tablet (0.125 mg total) by mouth daily. 30 tablet 2   ferrous sulfate 325 (65 FE) MG tablet Take 1 tablet (325 mg total) by mouth daily with breakfast. 30 tablet 0   insulin glargine (LANTUS SOLOSTAR) 100 UNIT/ML Solostar Pen Inject 20 Units into the skin 2 (two) times daily. Take 10 units in  the morning and 15 in the evenings. 15 mL 2   sacubitril-valsartan (ENTRESTO) 49-51 MG Take 1 tablet by mouth 2 (two) times daily. 60 tablet 2   spironolactone (ALDACTONE) 25 MG tablet Take 1 tablet (25 mg total) by mouth daily. 30 tablet 2   torsemide (DEMADEX) 20 MG tablet Take 4 tablets (80 mg total) by mouth daily. 60 tablet 1   No facility-administered medications prior to visit.    Allergies  Allergen Reactions   No [employer] Food Allergies Rash and Other (See Comments)    Lemons    ROS Review of Systems  Constitutional:  Negative for chills and fever.  Respiratory:  Positive for cough and shortness of breath.   Cardiovascular:  Positive for leg swelling. Negative for chest pain.  Gastrointestinal:  Positive for abdominal distention. Negative for diarrhea, nausea and vomiting.  Skin: Negative.   Neurological:  Positive for syncope and light-headedness.      Objective:    Physical Exam Constitutional:      Appearance: Normal appearance.  HENT:     Head: Normocephalic and atraumatic.  Eyes:     Conjunctiva/sclera: Conjunctivae normal.  Cardiovascular:     Rate and Rhythm: Normal rate and regular rhythm.  Pulmonary:     Effort: Pulmonary effort is normal.     Breath sounds: Normal breath sounds. No rales.  Abdominal:     General: There is distension.  Musculoskeletal:     Right lower leg: Edema present.     Left lower leg: Edema present.     Comments: 1+ bilateral lower extremity pitting edema  Skin:    General: Skin is warm and dry.  Neurological:     General: No focal deficit present.     Mental Status: She is alert. Mental status is at baseline.  Psychiatric:        Mood and Affect: Mood normal.        Behavior: Behavior normal.     LMP 07/19/2022  Wt Readings  from Last 3 Encounters:  08/04/22 202 lb 6.1 oz (91.8 kg)  06/08/22 240 lb (108.9 kg)  05/27/22 244 lb (110.7 kg)     Health Maintenance Due  Topic Date Due   COVID-19 Vaccine (1) Never  done   OPHTHALMOLOGY EXAM  Never done   Diabetic kidney evaluation - Urine ACR  06/12/2022   FOOT EXAM  06/18/2022    There are no preventive care reminders to display for this patient.  Lab Results  Component Value Date   TSH 5.114 (H) 11/09/2021   Lab Results  Component Value Date   WBC 6.3 08/01/2022   HGB 10.7 (L) 08/01/2022   HCT 38.3 08/01/2022   MCV 85.7 08/01/2022   PLT 463 (H) 08/01/2022   Lab Results  Component Value Date   NA 139 08/04/2022   K 3.9 08/04/2022   CO2 33 (H) 08/04/2022   GLUCOSE 226 (H) 08/04/2022   BUN 48 (H) 08/04/2022   CREATININE 1.52 (H) 08/04/2022   BILITOT 0.7 07/24/2022   ALKPHOS 144 (H) 07/24/2022   AST 19 07/24/2022   ALT 13 07/24/2022   PROT 6.2 (L) 07/24/2022   ALBUMIN 1.7 (L) 07/24/2022   CALCIUM 8.7 (L) 08/04/2022   ANIONGAP 7 08/04/2022   EGFR 52 (L) 06/12/2021   Lab Results  Component Value Date   CHOL 216 (H) 05/27/2022   Lab Results  Component Value Date   HDL 46 05/27/2022   Lab Results  Component Value Date   LDLCALC 145 (H) 05/27/2022   Lab Results  Component Value Date   TRIG 124 05/27/2022   Lab Results  Component Value Date   CHOLHDL 4.7 05/27/2022   Lab Results  Component Value Date   HGBA1C 10.1 (H) 08/03/2022      Assessment & Plan:   1. 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  It is too soon to recheck, however we will keep her Lantus the same in the morning at 10 units and increase the evening dose to 15 units.  Refill insulin today.  Follow-up in 1 month for A1c recheck.  - insulin glargine (LANTUS SOLOSTAR) 100 UNIT/ML Solostar Pen; Take 10 units in the morning and 15 in the evenings.  Dispense: 15 mL; Refill: PRN  2. Anemia, unspecified type: Patient states that she cannot afford her OCPs, however her menstrual cycles are much more tolerable.  We will recheck CBC, however if hemoglobin is still low  she will require referral to hematology.  - CBC w/Diff/Platelet  3. HFrEF (heart failure with reduced ejection fraction) (HCC)/Syncope, unspecified syncope type: Heart failure worsening.  She is following with the heart failure clinic, note reviewed from 01/15/2022.  Medications reviewed.  Syncope most likely secondary to a combination of the patient being intravascularly dry due to worsening heart failure as well as anemia.  Discussed signs of orthostatic hypotension, standing and walking slowly.  The patient does live with her mother and sister.  Reiterated importance of following fluid and sodium restriction.  4. Need for influenza vaccination: Flu vaccine administered today.  - Flu Vaccine QUAD 6+ mos PF IM (Fluarix Quad PF)  Follow-up: No follow-ups on file.    Margarita MailElisabeth Emrick Hensch, DO

## 2022-08-09 ENCOUNTER — Inpatient Hospital Stay: Payer: Medicaid Other | Admitting: Internal Medicine

## 2022-08-10 ENCOUNTER — Telehealth: Payer: Self-pay | Admitting: Family

## 2022-08-10 ENCOUNTER — Encounter: Payer: Medicaid Other | Admitting: Family

## 2022-08-10 NOTE — Telephone Encounter (Signed)
Patient did not show for her Heart Failure Clinic appointment on 08/10/22

## 2022-08-10 NOTE — Progress Notes (Deleted)
Patient ID: MAIZE LINEBERGER, female    DOB: 1985/09/16, 37 y.o.   MRN: [id number]  Primary cardiologist: Debbe Odea, MD (last seen PCP: Margarita Mail, DO (last seen   HPI  Ms Semo is a 37 y/o female with a history of DM, HTN, previous drug use and chronic heart failure.   Echo 07/25/22: EF 25-30% along with moderate elevated PA pressure of 50.7 mmHg, mild LAE, mild MR and moderate/ severe TR. Echo 09/18/21: EF of 25-30%. Echo 06/30/21: EF of 30-35% along with mild MR. Echo 03/20/21: EF of 25-30% along with severely elevated PA pressure of 70.8 mmHg, mild LAE, mild MR and mild/moderate TR. Echo 10/11/20: EF of 30-35% along with moderate TR.   RHC 07/28/22: Severely elevated right and left-sided filling pressures, moderate pulmonary hypertension and moderately reduced cardiac output.  RA: 30 mmHg RV: 56/14 mmHg PW: 34 mmHg PA: 54/30 with a mean of 41 mmHg PA sat is 42% Cardiac output: 4.47 with a cardiac index of 2. CPO is 0.89 PAPI: 0.8  cMRI 05/19/21 showed an EF of 42%  Admitted 07/24/22 due to decompensated HF. Initially needed lasix gtt and milrinone. RHC per above. + cocaine UOP ~ 41 liters. Was in the ED 06/08/22 due to influenza. Was in the ED 05/27/22 due to toe pain. Admitted 02/09/22 due to acute on chronic HF. + cocaine. Discharged after 7 days. Was in the ED 11/28/21 due to HF exacerbation. Given additional diuretic with improvement of symptoms and she was released.   She presents today for a HF follow-up visit with a chief complaint of   Is supposed to have ICD placed at some point but has been anemic and they are wanting this to be corrected prior to insertion.   Past Medical History:  Diagnosis Date   Acid reflux    Chronic HFrEF (heart failure with reduced ejection fraction) (HCC)    a. 10/2019 Echo: EF 35-40%, GrII DD; b. 05/2020 Echo: EF 20-25%, glob HK; c. 10/2020 Echo: EF 30-35%, glob HK. GrII DD, Mildly red RV fxn. Mod TR; d.  05/2021 cMRI: EF 42%, no LGE. Nl RV  size/fxn.   CKD (chronic kidney disease), stage II    Diabetes mellitus (HCC)    H/O medication noncompliance    Hypertension    Microcytic anemia    NICM (nonischemic cardiomyopathy) (HCC)    a. 10/2019 Echo: EF 35-40%; b. 10/2019 MV: No ischemia. Small apical defect-->breast attenuation; c. 05/2020 Echo: EF 20-25%; d. 10/2020 Echo: EF 30-35%, glob HK. GrII DD, Mildly red RV fxn. Mod TR; e. 05/2021 cMRI: EF 42%, no LGE. Nl RV size/fxn.   Obesity    Polysubstance abuse [facility])    Past Surgical History:  Procedure Laterality Date   CHOLECYSTECTOMY     HERNIA REPAIR     RIGHT HEART CATH N/A 07/28/2022   Procedure: RIGHT HEART CATH;  Surgeon: Iran Ouch, MD;  Location: [facility] INVASIVE CV LAB;  Service: Cardiovascular;  Laterality: N/A;   Family History  Problem Relation Age of Onset   Heart failure Mother        Onset of heart failure 40s.  Died in 2022-05-15.   Diabetes Mother    Hypertension Father    Diabetes Father    Social History   Tobacco Use   Smoking status: Former    Packs/day: .5    Types: Cigarettes    Quit date: 2022    Years since quitting: 2.2   Smokeless tobacco: Never  Substance Use Topics   Alcohol use: Not Currently    Comment: ~ 4 shots/day on weekends only   Allergies  Allergen Reactions   No [employer] Food Allergies Rash and Other (See Comments)    Lemons    Review of Systems  Constitutional:  Positive for fatigue. Negative for appetite change.  HENT:  Negative for congestion, postnasal drip and sore throat.   Eyes: Negative.   Respiratory:  Positive for cough. Negative for shortness of breath.   Cardiovascular:  Positive for leg swelling (around right foot). Negative for chest pain and palpitations.  Gastrointestinal:  Negative for abdominal distention and abdominal pain.  Endocrine: Negative.   Genitourinary: Negative.   Musculoskeletal:  Positive for arthralgias (right great toe) and back pain.  Skin: Negative.   Allergic/Immunologic:  Negative.   Neurological:  Positive for light-headedness. Negative for dizziness.  Hematological:  Negative for adenopathy. Does not bruise/bleed easily.  Psychiatric/Behavioral:  Positive for sleep disturbance (sleeping on 2 pillows). Negative for dysphoric mood. The patient is not nervous/anxious.      Physical Exam Vitals and nursing note reviewed.  Constitutional:      Appearance: Normal appearance.  HENT:     Head: Normocephalic and atraumatic.  Cardiovascular:     Rate and Rhythm: Normal rate and regular rhythm.  Pulmonary:     Effort: Pulmonary effort is normal. No respiratory distress.     Breath sounds: No wheezing or rales.  Abdominal:     General: There is no distension.  Genitourinary:    General: Normal vulva.  Musculoskeletal:        General: Tenderness (right foot/ right great toe) present.     Cervical back: Normal range of motion and neck supple.     Right lower leg: No tenderness. Edema (trace pitting) present.     Left lower leg: No tenderness. Edema (trace pitting) present.  Skin:    General: Skin is warm and dry.  Neurological:     General: No focal deficit present.     Mental Status: She is alert and oriented to person, place, and time.  Psychiatric:        Mood and Affect: Mood normal.        Behavior: Behavior normal.        Thought Content: Thought content normal.     Assessment & Plan:  1: NICM with reduced ejection fraction- - NYHA class II - euvolemic - not weighing daily; reminded to call for an overnight weight gain of > 2 pounds or a weekly weight gain of > 5 pounds - weight up 3 pounds from last visit here 4 months ago  - saw cardiology Brion Aliment) 02/09/22 - saw EP Lalla Brothers) 10/07/21; was to have ICD implanted 10/12/21 but it was cancelled due to anemia - on GDMT of carvedilol, entresto & farxiga  - BNP 02/09/22 was 859.3 - PharmD reconciled meds w/ patient  2: HTN- - BP  - saw PCP Caralee Ates) 02/04/22 - BMP 02/16/22 reviewed and showed  sodium 138, potassium 3.9, creatinine 1.24 & GFR 57  3: DM- - A1c 02/10/22 was 9.6%  4: Anemia- - CBC 02/16/22 reviewed and showed hemoglobin 8.6  Advised her to call PCP regarding right great toe pain as it sounds like gout. She says that she has recently eaten shrimp/ pintos.   Medication list reviewed.   Return in 1 week, sooner if needed. Check BMP in 1 week if we resume spironolactone

## 2022-08-16 ENCOUNTER — Encounter: Payer: Medicaid Other | Admitting: Cardiology

## 2022-10-03 NOTE — Progress Notes (Unsigned)
Established Patient Office Visit  Subjective:  Patient ID: Molly Howard, female    DOB: [birth year]  Age: 37 y.o. MRN: [id number]  CC:  No chief complaint on file.   HPI Molly Howard presents for follow up. Since last office visit patient has been admitted the hospital twice and seen in the ER 3 times with several no-show appointments here.   Diabetes Type 2: -First diagnosed when she was a kid and told she was a type 1, but then told she was a type 2 later in life so uncertain  -Last A1c 4/24 10/1 -Medications: Fargixa 10, Lantus 10 units BID -Patient is compliant with the above medications and reports no side effects.  -Checking BG at home: fasting 250 average, evening 150, went to 70's in the afternoon once a few days ago  -Diet: recently cut out fried foods, tries to eat fruit for snacks but has been drinking [place] [place] Orange Juice  -Exercise: tries to walk but gets short of breath with the heart failure  -Eye exam: April 22 scheduled but did not go - due  -Foot exam: UTD, saw Podiatrist and had nails cut  -Microalbumin: elevated, on Entresto and Farxiga  -Statin: yes -PNA vaccine: Prevnar 23 in 7/22 -Denies symptoms of hypoglycemia, polyuria, polydipsia, numbness extremities, foot ulcers/trauma.   Hypertension/HFrEF/NICM: -Medications: Torsemide 30 once daily, Carvedilol 6.25, Spirnolactone 25, Entresto and KCl 10 -Patient is compliant with above medications and reports no side effects. -Denies any SOB, CP, vision changes, LE edema or symptoms of hypotension -Follows with Cardiology note reviewed from 01/15/22 -Planning for subcutaneous ICD but hemoglobin low  -Last echo 09/18/21 showing reduced EF at 25-30% with global hypokinesis of the left ventricle  -She does endorse today some presyncopal and syncopal episodes.  She states she is passed out twice in the last several months, last of which was last month.  She states she was standing when she heard a "whooshing" sound in  her ears and she lost consciousness.  She fell forward onto her face.  Her mother and sister were there to help her, patient states she was unconscious for 5 to 10 minutes.  The patient states that she does feel unsteady when she stands up and lightheaded when standing at times.  HLD: -Medications: Lipitor 20 mg for one year -Patient is compliant with above medications and reports no side effects.  -Last lipid panel: Lipid Panel     Component Value Date/Time   CHOL 216 (H) 05/27/2022 1022   TRIG 124 05/27/2022 1022   HDL 46 05/27/2022 1022   CHOLHDL 4.7 05/27/2022 1022   VLDL 25 05/27/2022 1022   LDLCALC 145 (H) 05/27/2022 1022   LDLCALC 171 (H) 06/12/2021 1354   GERD: -Not currently on daily medications, taking Pepcid as needed   CKD: -Last creatinine 9/23 1.33, GFR 53   Anemia: -Hgb 10.3 in 2/23, has menorrhagia, referred to Gyn who did Pap and vaginal [place]. US showing endometrial polyp and she was given an OCP in March but she has not started it or picked it up from the pharmacy yet.  - last hgb 7/23 8.6% -Having a period once a month - will bleed for 7 days, light for 3 days then heavy the last 4 days. Has to change pads/tampons every 1 hour on heavy days. Does have clots.   -Had been seeing gynecology and was prescribed an OCP, however the patient states she cannot afford this medication -She stating now that her menstrual  cycles are more manageable.  Her LMP was a few days ago.  She states her bleeding is much lighter and more tolerable.  Her usual menstrual cycle lasts about 1 week.  Past Medical History:  Diagnosis Date   Acid reflux    Chronic HFrEF (heart failure with reduced ejection fraction) (HCC)    a. 10/2019 Echo: EF 35-40%, GrII DD; b. 05/2020 Echo: EF 20-25%, glob HK; c. 10/2020 Echo: EF 30-35%, glob HK. GrII DD, Mildly red RV fxn. Mod TR; d.  05/2021 cMRI: EF 42%, no LGE. Nl RV size/fxn.   CKD (chronic kidney disease), stage II    Diabetes mellitus (HCC)    H/O  medication noncompliance    Hypertension    Microcytic anemia    NICM (nonischemic cardiomyopathy) (HCC)    a. 10/2019 Echo: EF 35-40%; b. 10/2019 MV: No ischemia. Small apical defect-->breast attenuation; c. 05/2020 Echo: EF 20-25%; d. 10/2020 Echo: EF 30-35%, glob HK. GrII DD, Mildly red RV fxn. Mod TR; e. 05/2021 cMRI: EF 42%, no LGE. Nl RV size/fxn.   Obesity    Polysubstance abuse [facility])     Past Surgical History:  Procedure Laterality Date   CHOLECYSTECTOMY     HERNIA REPAIR     RIGHT HEART CATH N/A 07/28/2022   Procedure: RIGHT HEART CATH;  Surgeon: Iran Ouch, MD;  Location: [facility] INVASIVE CV LAB;  Service: Cardiovascular;  Laterality: N/A;    Family History  Problem Relation Age of Onset   Heart failure Mother        Onset of heart failure 40s.  Died in 2022-04-27.   Diabetes Mother    Hypertension Father    Diabetes Father     Social History   Socioeconomic History   Marital status: Single    Spouse name: Not on file   Number of children: Not on file   Years of education: Not on file   Highest education level: Not on file  Occupational History   Not on file  Tobacco Use   Smoking status: Former    Packs/day: .5    Types: Cigarettes    Quit date: 2022    Years since quitting: 2.4   Smokeless tobacco: Never  Vaping Use   Vaping Use: Never used  Substance and Sexual Activity   Alcohol use: Not Currently    Comment: ~ 4 shots/day on weekends only   Drug use: Not Currently    Types: Cocaine    Comment: Admits to using cocaine up and will February 2024.   Sexual activity: Not Currently    Birth control/protection: None  Other Topics Concern   Not on file  Social History Narrative   Lives locally.  Does not routinely exercise.  Has been using cocaine.   Social Determinants of Health   Financial Resource Strain: High Risk (06/16/2020)   Overall Financial Resource Strain (CARDIA)    Difficulty of Paying Living Expenses: Hard  Food Insecurity: No Food  Insecurity (07/25/2022)   Hunger Vital Sign    Worried About Running Out of Food in the Last Year: Never true    Ran Out of Food in the Last Year: Never true  Transportation Needs: No Transportation Needs (07/25/2022)   PRAPARE - Administrator, Civil Service (Medical): No    Lack of Transportation (Non-Medical): No  Physical Activity: Not on file  Stress: Not on file  Social Connections: Not on file  Intimate Partner Violence: Not At Risk (07/25/2022)  Humiliation, Afraid, Rape, and Kick questionnaire    Fear of Current or Ex-Partner: No    Emotionally Abused: No    Physically Abused: No    Sexually Abused: No    Outpatient Medications Prior to Visit  Medication Sig Dispense Refill   atorvastatin (LIPITOR) 20 MG tablet Take 1 tablet (20 mg total) by mouth daily. 90 tablet 3   calcium carbonate (OS-CAL - DOSED IN MG OF ELEMENTAL CALCIUM) 1250 (500 Ca) MG tablet Take 1 tablet (1,250 mg total) by mouth 3 (three) times daily with meals. 60 tablet 0   carvedilol (COREG) 3.125 MG tablet Take 1 tablet (3.125 mg total) by mouth 2 (two) times daily with a meal. 60 tablet 2   dapagliflozin propanediol (FARXIGA) 10 MG TABS tablet Take 1 tablet (10 mg total) by mouth once daily. 90 tablet 2   digoxin (LANOXIN) 0.125 MG tablet Take 1 tablet (0.125 mg total) by mouth daily. 30 tablet 2   ferrous sulfate 325 (65 FE) MG tablet Take 1 tablet (325 mg total) by mouth daily with breakfast. 30 tablet 0   insulin glargine (LANTUS SOLOSTAR) 100 UNIT/ML Solostar Pen Inject 20 Units into the skin 2 (two) times daily. Take 10 units in the morning and 15 in the evenings. 15 mL 2   sacubitril-valsartan (ENTRESTO) 49-51 MG Take 1 tablet by mouth 2 (two) times daily. 60 tablet 2   spironolactone (ALDACTONE) 25 MG tablet Take 1 tablet (25 mg total) by mouth daily. 30 tablet 2   torsemide (DEMADEX) 20 MG tablet Take 4 tablets (80 mg total) by mouth daily. 60 tablet 1   No facility-administered medications  prior to visit.    Allergies  Allergen Reactions   No [employer] Food Allergies Rash and Other (See Comments)    Lemons    ROS Review of Systems  Constitutional:  Negative for chills and fever.  Respiratory:  Positive for cough and shortness of breath.   Cardiovascular:  Positive for leg swelling. Negative for chest pain.  Gastrointestinal:  Positive for abdominal distention. Negative for diarrhea, nausea and vomiting.  Skin: Negative.   Neurological:  Positive for syncope and light-headedness.      Objective:    Physical Exam Constitutional:      Appearance: Normal appearance.  HENT:     Head: Normocephalic and atraumatic.  Eyes:     Conjunctiva/sclera: Conjunctivae normal.  Cardiovascular:     Rate and Rhythm: Normal rate and regular rhythm.  Pulmonary:     Effort: Pulmonary effort is normal.     Breath sounds: Normal breath sounds. No rales.  Abdominal:     General: There is distension.  Musculoskeletal:     Right lower leg: Edema present.     Left lower leg: Edema present.     Comments: 1+ bilateral lower extremity pitting edema  Skin:    General: Skin is warm and dry.  Neurological:     General: No focal deficit present.     Mental Status: She is alert. Mental status is at baseline.  Psychiatric:        Mood and Affect: Mood normal.        Behavior: Behavior normal.     There were no vitals taken for this visit. Wt Readings from Last 3 Encounters:  08/04/22 202 lb 6.1 oz (91.8 kg)  06/08/22 240 lb (108.9 kg)  05/27/22 244 lb (110.7 kg)     Health Maintenance Due  Topic Date Due   COVID-19 Vaccine (  1) Never done   OPHTHALMOLOGY EXAM  Never done   Diabetic kidney evaluation - Urine ACR  06/12/2022   FOOT EXAM  06/18/2022    There are no preventive care reminders to display for this patient.  Lab Results  Component Value Date   TSH 5.114 (H) 11/09/2021   Lab Results  Component Value Date   WBC 6.3 08/01/2022   HGB 10.7 (L) 08/01/2022    HCT 38.3 08/01/2022   MCV 85.7 08/01/2022   PLT 463 (H) 08/01/2022   Lab Results  Component Value Date   NA 139 08/04/2022   K 3.9 08/04/2022   CO2 33 (H) 08/04/2022   GLUCOSE 226 (H) 08/04/2022   BUN 48 (H) 08/04/2022   CREATININE 1.52 (H) 08/04/2022   BILITOT 0.7 07/24/2022   ALKPHOS 144 (H) 07/24/2022   AST 19 07/24/2022   ALT 13 07/24/2022   PROT 6.2 (L) 07/24/2022   ALBUMIN 1.7 (L) 07/24/2022   CALCIUM 8.7 (L) 08/04/2022   ANIONGAP 7 08/04/2022   EGFR 52 (L) 06/12/2021   Lab Results  Component Value Date   CHOL 216 (H) 05/27/2022   Lab Results  Component Value Date   HDL 46 05/27/2022   Lab Results  Component Value Date   LDLCALC 145 (H) 05/27/2022   Lab Results  Component Value Date   TRIG 124 05/27/2022   Lab Results  Component Value Date   CHOLHDL 4.7 05/27/2022   Lab Results  Component Value Date   HGBA1C 10.1 (H) 08/03/2022      Assessment & Plan:   1. 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  It is too soon to recheck, however we will keep her Lantus the same in the morning at 10 units and increase the evening dose to 15 units.  Refill insulin today.  Follow-up in 1 month for A1c recheck.  - insulin glargine (LANTUS SOLOSTAR) 100 UNIT/ML Solostar Pen; Take 10 units in the morning and 15 in the evenings.  Dispense: 15 mL; Refill: PRN  2. Anemia, unspecified type: Patient states that she cannot afford her OCPs, however her menstrual cycles are much more tolerable.  We will recheck CBC, however if hemoglobin is still low she will require referral to hematology.  - CBC w/Diff/Platelet  3. HFrEF (heart failure with reduced ejection fraction) (HCC)/Syncope, unspecified syncope type: Heart failure worsening.  She is following with the heart failure clinic, note reviewed from 01/15/2022.  Medications reviewed.  Syncope most likely secondary to a combination of the  patient being intravascularly dry due to worsening heart failure as well as anemia.  Discussed signs of orthostatic hypotension, standing and walking slowly.  The patient does live with her mother and sister.  Reiterated importance of following fluid and sodium restriction.  4. Need for influenza vaccination: Flu vaccine administered today.  - Flu Vaccine QUAD 6+ mos PF IM (Fluarix Quad PF)  Follow-up: No follow-ups on file.    Margarita Mail, DO

## 2022-10-04 ENCOUNTER — Encounter: Payer: Self-pay | Admitting: Internal Medicine

## 2022-10-04 ENCOUNTER — Ambulatory Visit (INDEPENDENT_AMBULATORY_CARE_PROVIDER_SITE_OTHER): Payer: Medicaid Other | Admitting: Internal Medicine

## 2022-10-04 VITALS — BP 134/82 | HR 101 | Temp 97.9°F | Resp 18 | Ht 67.0 in | Wt 262.2 lb

## 2022-10-04 DIAGNOSIS — I5020 Unspecified systolic (congestive) heart failure: Secondary | ICD-10-CM | POA: Diagnosis not present

## 2022-10-04 DIAGNOSIS — E1129 Type 2 diabetes mellitus with other diabetic kidney complication: Secondary | ICD-10-CM

## 2022-10-04 DIAGNOSIS — Z7984 Long term (current) use of oral hypoglycemic drugs: Secondary | ICD-10-CM

## 2022-10-04 DIAGNOSIS — I428 Other cardiomyopathies: Secondary | ICD-10-CM | POA: Diagnosis not present

## 2022-10-04 DIAGNOSIS — D649 Anemia, unspecified: Secondary | ICD-10-CM | POA: Diagnosis not present

## 2022-10-04 DIAGNOSIS — E782 Mixed hyperlipidemia: Secondary | ICD-10-CM | POA: Diagnosis not present

## 2022-10-04 DIAGNOSIS — I10 Essential (primary) hypertension: Secondary | ICD-10-CM | POA: Diagnosis not present

## 2022-10-04 DIAGNOSIS — Z794 Long term (current) use of insulin: Secondary | ICD-10-CM

## 2022-10-04 MED ORDER — LANTUS SOLOSTAR 100 UNIT/ML ~~LOC~~ SOPN: PEN_INJECTOR | SUBCUTANEOUS | 2 refills | Status: DC

## 2022-10-04 MED ORDER — TORSEMIDE 40 MG PO TABS
40.0000 mg | ORAL_TABLET | Freq: Two times a day (BID) | ORAL | 0 refills | Status: DC
Start: 2022-10-04 — End: 2022-11-15

## 2022-10-04 MED ORDER — CARVEDILOL 3.125 MG PO TABS: 3.1250 mg | ORAL_TABLET | Freq: Two times a day (BID) | ORAL | 0 refills | Status: DC

## 2022-10-04 MED ORDER — ATORVASTATIN CALCIUM 20 MG PO TABS: 20.0000 mg | ORAL_TABLET | Freq: Every day | ORAL | 0 refills | Status: DC

## 2022-10-04 MED ORDER — DAPAGLIFLOZIN PROPANEDIOL 10 MG PO TABS: 10.0000 mg | ORAL_TABLET | Freq: Every day | ORAL | 0 refills | Status: DC

## 2022-10-04 MED ORDER — SPIRONOLACTONE 25 MG PO TABS: 25.0000 mg | ORAL_TABLET | Freq: Every day | ORAL | 2 refills | Status: DC

## 2022-10-04 MED ORDER — SACUBITRIL-VALSARTAN 49-51 MG PO TABS
1.0000 | ORAL_TABLET | Freq: Two times a day (BID) | ORAL | 0 refills | Status: DC
Start: 2022-10-04 — End: 2022-11-15

## 2022-10-05 LAB — BASIC METABOLIC PANEL
BUN/Creatinine Ratio: 21 (calc) (ref 6–22)
BUN: 31 mg/dL — ABNORMAL HIGH (ref 7–25)
CO2: 30 mmol/L (ref 20–32)
Calcium: 7.1 mg/dL — ABNORMAL LOW (ref 8.6–10.2)
Chloride: 97 mmol/L — ABNORMAL LOW (ref 98–110)
Creat: 1.45 mg/dL — ABNORMAL HIGH (ref 0.50–0.97)
Glucose, Bld: 256 mg/dL — ABNORMAL HIGH (ref 65–99)
Potassium: 5.1 mmol/L (ref 3.5–5.3)
Sodium: 136 mmol/L (ref 135–146)

## 2022-10-05 LAB — CBC WITH DIFFERENTIAL/PLATELET
Absolute Monocytes: 400 cells/uL (ref 200–950)
Basophils Absolute: 70 cells/uL (ref 0–200)
Basophils Relative: 1.2 %
Eosinophils Absolute: 220 cells/uL (ref 15–500)
Eosinophils Relative: 3.8 %
HCT: 37.8 % (ref 35.0–45.0)
Hemoglobin: 11.4 g/dL — ABNORMAL LOW (ref 11.7–15.5)
Lymphs Abs: 870 cells/uL (ref 850–3900)
MCH: 27.5 pg (ref 27.0–33.0)
MCHC: 30.2 g/dL — ABNORMAL LOW (ref 32.0–36.0)
MCV: 91.1 fL (ref 80.0–100.0)
MPV: 9.2 fL (ref 7.5–12.5)
Monocytes Relative: 6.9 %
Neutro Abs: 4240 cells/uL (ref 1500–7800)
Neutrophils Relative %: 73.1 %
Platelets: 339 10*3/uL (ref 140–400)
RBC: 4.15 10*6/uL (ref 3.80–5.10)
RDW: 15.4 % — ABNORMAL HIGH (ref 11.0–15.0)
Total Lymphocyte: 15 %
WBC: 5.8 10*3/uL (ref 3.8–10.8)

## 2022-10-05 LAB — MICROALBUMIN / CREATININE URINE RATIO
Creatinine, Urine: 217 mg/dL (ref 20–275)
Microalb, Ur: >600 mg/dL

## 2022-10-19 NOTE — Progress Notes (Deleted)
Established Patient Office Visit  Subjective:  Patient ID: Molly Howard, female    DOB: [birth year]  Age: 37 y.o. MRN: [id number]  CC:  No chief complaint on file.   HPI Molly Howard presents for follow up.  Diabetes Type 2: -Last A1c 4/24 10.1% -Medications: Fargixa 10, Lantus 10 units in the am, 15 in the pm -Patient is compliant with the above medications and reports no side effects.  -Checking BG at home: not checking  -Eye exam: Due -Foot exam: Due -Microalbumin: UTD 6/24 -Statin: yes needs refilled -PNA vaccine: Prevnar 23 in 7/22 -Denies symptoms of hypoglycemia, polyuria, polydipsia, numbness extremities, foot ulcers/trauma.   Hypertension/HFrEF/NICM: -Medications: Torsemide 40 mg BID, Carvedilol 6.25 mg, Spirnolactone 25 mg, Entresto and KCl 10 meQ  -Patient is compliant with above medications and reports no side effects. -Denies any SOB, CP, vision changes, but has swelling in legs and abdominal pain -Follows with Cardiology note reviewed from 01/15/22 but not since  -Had been planning for subcutaneous ICD but hemoglobin low  -Last echo 07/25/22 showing reduced EF at 25-30% with global hypokinesis of the left ventricle   HLD: -Medications: Lipitor 20 mg  -Patient is compliant with above medications and reports no side effects.  -Last lipid panel: Lipid Panel     Component Value Date/Time   CHOL 216 (H) 05/27/2022 1022   TRIG 124 05/27/2022 1022   HDL 46 05/27/2022 1022   CHOLHDL 4.7 05/27/2022 1022   VLDL 25 05/27/2022 1022   LDLCALC 145 (H) 05/27/2022 1022   LDLCALC 171 (H) 06/12/2021 1354   GERD: -Not currently on daily medications, taking Pepcid as needed   CKD: -Last creatinine 1.45 6/24   Anemia: -Hgb 10.7 4/24, has menorrhagia, referred to Gyn who did Pap and vaginal [place]. US showing endometrial polyp and she was given an OCP in March but she has not started it or picked it up from the pharmacy yet.  - last hgb 7/23 8.6% -Having a period once a  month - will bleed for 7 days, light for 3 days then heavy the last 4 days. Has to change pads/tampons every 1 hour on heavy days. Does have clots.   -Had been seeing gynecology and was prescribed an OCP, however the patient states she cannot afford this medication -She stating now that her menstrual cycles are more manageable.  Her LMP was a few days ago.  She states her bleeding is much lighter and more tolerable.  Her usual menstrual cycle lasts about 1 week.  Past Medical History:  Diagnosis Date   Acid reflux    Chronic HFrEF (heart failure with reduced ejection fraction) (HCC)    a. 10/2019 Echo: EF 35-40%, GrII DD; b. 05/2020 Echo: EF 20-25%, glob HK; c. 10/2020 Echo: EF 30-35%, glob HK. GrII DD, Mildly red RV fxn. Mod TR; d.  05/2021 cMRI: EF 42%, no LGE. Nl RV size/fxn.   CKD (chronic kidney disease), stage II    Diabetes mellitus (HCC)    H/O medication noncompliance    Hypertension    Microcytic anemia    NICM (nonischemic cardiomyopathy) (HCC)    a. 10/2019 Echo: EF 35-40%; b. 10/2019 MV: No ischemia. Small apical defect-->breast attenuation; c. 05/2020 Echo: EF 20-25%; d. 10/2020 Echo: EF 30-35%, glob HK. GrII DD, Mildly red RV fxn. Mod TR; e. 05/2021 cMRI: EF 42%, no LGE. Nl RV size/fxn.   Obesity    Polysubstance abuse [facility])     Past Surgical History:  Procedure  Laterality Date   CHOLECYSTECTOMY     HERNIA REPAIR     RIGHT HEART CATH N/A 07/28/2022   Procedure: RIGHT HEART CATH;  Surgeon: Iran Ouch, MD;  Location: [facility] INVASIVE CV LAB;  Service: Cardiovascular;  Laterality: N/A;    Family History  Problem Relation Age of Onset   Heart failure Mother        Onset of heart failure 40s.  Died in 05-10-2022.   Diabetes Mother    Hypertension Father    Diabetes Father     Social History   Socioeconomic History   Marital status: Single    Spouse name: Not on file   Number of children: Not on file   Years of education: Not on file   Highest education level: Not on  file  Occupational History   Not on file  Tobacco Use   Smoking status: Former    Packs/day: .5    Types: Cigarettes    Quit date: 2022    Years since quitting: 2.4   Smokeless tobacco: Never  Vaping Use   Vaping Use: Never used  Substance and Sexual Activity   Alcohol use: Not Currently    Comment: ~ 4 shots/day on weekends only   Drug use: Not Currently    Types: Cocaine    Comment: Admits to using cocaine up and will February 2024.   Sexual activity: Not Currently    Birth control/protection: None  Other Topics Concern   Not on file  Social History Narrative   Lives locally.  Does not routinely exercise.  Has been using cocaine.   Social Determinants of Health   Financial Resource Strain: High Risk (06/16/2020)   Overall Financial Resource Strain (CARDIA)    Difficulty of Paying Living Expenses: Hard  Food Insecurity: No Food Insecurity (07/25/2022)   Hunger Vital Sign    Worried About Running Out of Food in the Last Year: Never true    Ran Out of Food in the Last Year: Never true  Transportation Needs: No Transportation Needs (07/25/2022)   PRAPARE - Administrator, Civil Service (Medical): No    Lack of Transportation (Non-Medical): No  Physical Activity: Not on file  Stress: Not on file  Social Connections: Not on file  Intimate Partner Violence: Not At Risk (07/25/2022)   Humiliation, Afraid, Rape, and Kick questionnaire    Fear of Current or Ex-Partner: No    Emotionally Abused: No    Physically Abused: No    Sexually Abused: No    Outpatient Medications Prior to Visit  Medication Sig Dispense Refill   atorvastatin (LIPITOR) 20 MG tablet Take 1 tablet (20 mg total) by mouth daily. 90 tablet 0   calcium carbonate (OS-CAL - DOSED IN MG OF ELEMENTAL CALCIUM) 1250 (500 Ca) MG tablet Take 1 tablet (1,250 mg total) by mouth 3 (three) times daily with meals. 60 tablet 0   carvedilol (COREG) 3.125 MG tablet Take 1 tablet (3.125 mg total) by mouth 2 (two)  times daily with a meal. 60 tablet 0   dapagliflozin propanediol (FARXIGA) 10 MG TABS tablet Take 1 tablet (10 mg total) by mouth once daily. 90 tablet 0   digoxin (LANOXIN) 0.125 MG tablet Take 1 tablet (0.125 mg total) by mouth daily. 30 tablet 2   ferrous sulfate 325 (65 FE) MG tablet Take 1 tablet (325 mg total) by mouth daily with breakfast. 30 tablet 0   insulin glargine (LANTUS SOLOSTAR) 100 UNIT/ML Solostar Pen  Take 10 units in the morning and 15 in the evenings. 15 mL 2   sacubitril-valsartan (ENTRESTO) 49-51 MG Take 1 tablet by mouth 2 (two) times daily. 60 tablet 0   spironolactone (ALDACTONE) 25 MG tablet Take 1 tablet (25 mg total) by mouth daily. 30 tablet 2   Torsemide 40 MG TABS Take 40 mg by mouth in the morning and at bedtime. 30 tablet 0   No facility-administered medications prior to visit.    Allergies  Allergen Reactions   No [employer] Food Allergies Rash and Other (See Comments)    Lemons    ROS Review of Systems  Constitutional:  Negative for chills and fever.  Respiratory:  Negative for cough and shortness of breath.   Cardiovascular:  Positive for leg swelling. Negative for chest pain.  Gastrointestinal:  Positive for abdominal distention. Negative for diarrhea, nausea and vomiting.  Skin: Negative.       Objective:    Physical Exam Constitutional:      Appearance: Normal appearance.  HENT:     Head: Normocephalic and atraumatic.  Eyes:     Conjunctiva/sclera: Conjunctivae normal.  Cardiovascular:     Rate and Rhythm: Normal rate and regular rhythm.  Pulmonary:     Effort: Pulmonary effort is normal.     Breath sounds: Normal breath sounds. No wheezing, rhonchi or rales.  Abdominal:     General: There is distension.  Musculoskeletal:     Right lower leg: Edema present.     Left lower leg: Edema present.     Comments: 1+ bilateral lower extremity pitting edema  Skin:    General: Skin is warm and dry.  Neurological:     General: No focal  deficit present.     Mental Status: She is alert. Mental status is at baseline.  Psychiatric:        Mood and Affect: Mood normal.        Behavior: Behavior normal.     There were no vitals taken for this visit. Wt Readings from Last 3 Encounters:  10/04/22 262 lb 3.2 oz (118.9 kg)  08/04/22 202 lb 6.1 oz (91.8 kg)  06/08/22 240 lb (108.9 kg)     Health Maintenance Due  Topic Date Due   OPHTHALMOLOGY EXAM  Never done   FOOT EXAM  06/18/2022    There are no preventive care reminders to display for this patient.  Lab Results  Component Value Date   TSH 5.114 (H) 11/09/2021   Lab Results  Component Value Date   WBC 5.8 10/04/2022   HGB 11.4 (L) 10/04/2022   HCT 37.8 10/04/2022   MCV 91.1 10/04/2022   PLT 339 10/04/2022   Lab Results  Component Value Date   NA 136 10/04/2022   K 5.1 10/04/2022   CO2 30 10/04/2022   GLUCOSE 256 (H) 10/04/2022   BUN 31 (H) 10/04/2022   CREATININE 1.45 (H) 10/04/2022   BILITOT 0.7 07/24/2022   ALKPHOS 144 (H) 07/24/2022   AST 19 07/24/2022   ALT 13 07/24/2022   PROT 6.2 (L) 07/24/2022   ALBUMIN 1.7 (L) 07/24/2022   CALCIUM 7.1 (L) 10/04/2022   ANIONGAP 7 08/04/2022   EGFR 52 (L) 06/12/2021   Lab Results  Component Value Date   CHOL 216 (H) 05/27/2022   Lab Results  Component Value Date   HDL 46 05/27/2022   Lab Results  Component Value Date   LDLCALC 145 (H) 05/27/2022   Lab Results  Component Value Date  TRIG 124 05/27/2022   Lab Results  Component Value Date   CHOLHDL 4.7 05/27/2022   Lab Results  Component Value Date   HGBA1C 10.1 (H) 08/03/2022      Assessment & Plan:   1. HFrEF (heart failure with reduced ejection fraction) (HCC)/Essential hypertension/NICM (nonischemic cardiomyopathy) (HCC): Had serious conversation with patient today about compliance. She is out of all of her medications and is fluid overloaded. Refill Torsemide to take 40 mg BID for 2 weeks. Check kidneys and potassium today. Coreg,  Spironolactone and Entreso all refilled but needs to call Cardiology to make an appointment.   - Torsemide 40 MG TABS; Take 40 mg by mouth in the morning and at bedtime.  Dispense: 30 tablet; Refill: 0 - carvedilol (COREG) 3.125 MG tablet; Take 1 tablet (3.125 mg total) by mouth 2 (two) times daily with a meal.  Dispense: 60 tablet; Refill: 0 - sacubitril-valsartan (ENTRESTO) 49-51 MG; Take 1 tablet by mouth 2 (two) times daily.  Dispense: 60 tablet; Refill: 0 - spironolactone (ALDACTONE) 25 MG tablet; Take 1 tablet (25 mg total) by mouth daily.  Dispense: 30 tablet; Refill: 2 - Basic Metabolic Panel (BMET) - dapagliflozin propanediol (FARXIGA) 10 MG TABS tablet; Take 1 tablet (10 mg total) by mouth once daily.  Dispense: 90 tablet; Refill: 0  2. Mixed hyperlipidemia: Refill statin.   - atorvastatin (LIPITOR) 20 MG tablet; Take 1 tablet (20 mg total) by mouth daily.  Dispense: 90 tablet; Refill: 0  3. 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 Restart Lantus 10 units in the morning and 15 units in the evening. Restart Farxiga as well.   - dapagliflozin propanediol (FARXIGA) 10 MG TABS tablet; Take 1 tablet (10 mg total) by mouth once daily.  Dispense: 90 tablet; Refill: 0 - insulin glargine (LANTUS SOLOSTAR) 100 UNIT/ML Solostar Pen; Take 10 units in the morning and 15 in the evenings.  Dispense: 15 mL; Refill: 2 - Urine Microalbumin w/creat. ratio  4. Anemia, unspecified type: Recheck labs.  - CBC w/Diff/Platelet   Follow-up: No follow-ups on file.    Margarita Mail, DO

## 2022-10-21 ENCOUNTER — Ambulatory Visit: Payer: Medicaid Other | Admitting: Internal Medicine

## 2022-11-05 ENCOUNTER — Emergency Department: Payer: Medicaid Other

## 2022-11-05 ENCOUNTER — Other Ambulatory Visit: Payer: Self-pay

## 2022-11-05 ENCOUNTER — Inpatient Hospital Stay
Admission: EM | Admit: 2022-11-05 | Discharge: 2022-11-15 | DRG: 291 | Disposition: A | Discharge status: Home / Self Care | Payer: Medicaid Other | Attending: Internal Medicine | Admitting: Internal Medicine

## 2022-11-05 DIAGNOSIS — I472 Ventricular tachycardia, unspecified: Secondary | ICD-10-CM | POA: Diagnosis present

## 2022-11-05 DIAGNOSIS — I517 Cardiomegaly: Secondary | ICD-10-CM | POA: Diagnosis not present

## 2022-11-05 DIAGNOSIS — I110 Hypertensive heart disease with heart failure: Secondary | ICD-10-CM | POA: Diagnosis not present

## 2022-11-05 DIAGNOSIS — I10 Essential (primary) hypertension: Secondary | ICD-10-CM | POA: Diagnosis present

## 2022-11-05 DIAGNOSIS — F1410 Cocaine abuse, uncomplicated: Secondary | ICD-10-CM | POA: Diagnosis present

## 2022-11-05 DIAGNOSIS — E1129 Type 2 diabetes mellitus with other diabetic kidney complication: Secondary | ICD-10-CM | POA: Diagnosis present

## 2022-11-05 DIAGNOSIS — D649 Anemia, unspecified: Secondary | ICD-10-CM | POA: Diagnosis present

## 2022-11-05 DIAGNOSIS — I130 Hypertensive heart and chronic kidney disease with heart failure and stage 1 through stage 4 chronic kidney disease, or unspecified chronic kidney disease: Principal | ICD-10-CM | POA: Diagnosis present

## 2022-11-05 DIAGNOSIS — N182 Chronic kidney disease, stage 2 (mild): Secondary | ICD-10-CM | POA: Diagnosis present

## 2022-11-05 DIAGNOSIS — K219 Gastro-esophageal reflux disease without esophagitis: Secondary | ICD-10-CM | POA: Diagnosis present

## 2022-11-05 DIAGNOSIS — Z833 Family history of diabetes mellitus: Secondary | ICD-10-CM

## 2022-11-05 DIAGNOSIS — I5043 Acute on chronic combined systolic (congestive) and diastolic (congestive) heart failure: Secondary | ICD-10-CM | POA: Diagnosis present

## 2022-11-05 DIAGNOSIS — E1165 Type 2 diabetes mellitus with hyperglycemia: Secondary | ICD-10-CM | POA: Diagnosis not present

## 2022-11-05 DIAGNOSIS — D509 Iron deficiency anemia, unspecified: Secondary | ICD-10-CM | POA: Diagnosis present

## 2022-11-05 DIAGNOSIS — R600 Localized edema: Secondary | ICD-10-CM | POA: Diagnosis present

## 2022-11-05 DIAGNOSIS — R079 Chest pain, unspecified: Secondary | ICD-10-CM | POA: Diagnosis not present

## 2022-11-05 DIAGNOSIS — I509 Heart failure, unspecified: Principal | ICD-10-CM

## 2022-11-05 DIAGNOSIS — I5023 Acute on chronic systolic (congestive) heart failure: Secondary | ICD-10-CM | POA: Diagnosis present

## 2022-11-05 DIAGNOSIS — R609 Edema, unspecified: Secondary | ICD-10-CM | POA: Diagnosis present

## 2022-11-05 DIAGNOSIS — E872 Acidosis, unspecified: Secondary | ICD-10-CM | POA: Diagnosis not present

## 2022-11-05 DIAGNOSIS — R601 Generalized edema: Secondary | ICD-10-CM | POA: Diagnosis present

## 2022-11-05 DIAGNOSIS — Z6841 Body Mass Index (BMI) 40.0 and over, adult: Secondary | ICD-10-CM

## 2022-11-05 DIAGNOSIS — Z79899 Other long term (current) drug therapy: Secondary | ICD-10-CM

## 2022-11-05 DIAGNOSIS — E782 Mixed hyperlipidemia: Secondary | ICD-10-CM

## 2022-11-05 DIAGNOSIS — T502X5A Adverse effect of carbonic-anhydrase inhibitors, benzothiadiazides and other diuretics, initial encounter: Secondary | ICD-10-CM | POA: Diagnosis not present

## 2022-11-05 DIAGNOSIS — R7989 Other specified abnormal findings of blood chemistry: Secondary | ICD-10-CM | POA: Diagnosis present

## 2022-11-05 DIAGNOSIS — E875 Hyperkalemia: Secondary | ICD-10-CM | POA: Diagnosis not present

## 2022-11-05 DIAGNOSIS — F17200 Nicotine dependence, unspecified, uncomplicated: Secondary | ICD-10-CM | POA: Diagnosis present

## 2022-11-05 DIAGNOSIS — I071 Rheumatic tricuspid insufficiency: Secondary | ICD-10-CM | POA: Diagnosis present

## 2022-11-05 DIAGNOSIS — I5021 Acute systolic (congestive) heart failure: Secondary | ICD-10-CM | POA: Diagnosis present

## 2022-11-05 DIAGNOSIS — R570 Cardiogenic shock: Secondary | ICD-10-CM

## 2022-11-05 DIAGNOSIS — I5020 Unspecified systolic (congestive) heart failure: Secondary | ICD-10-CM

## 2022-11-05 DIAGNOSIS — R0989 Other specified symptoms and signs involving the circulatory and respiratory systems: Secondary | ICD-10-CM | POA: Diagnosis not present

## 2022-11-05 DIAGNOSIS — I428 Other cardiomyopathies: Secondary | ICD-10-CM | POA: Diagnosis present

## 2022-11-05 DIAGNOSIS — E119 Type 2 diabetes mellitus without complications: Secondary | ICD-10-CM

## 2022-11-05 DIAGNOSIS — I5082 Biventricular heart failure: Secondary | ICD-10-CM | POA: Diagnosis present

## 2022-11-05 DIAGNOSIS — Z720 Tobacco use: Secondary | ICD-10-CM | POA: Diagnosis present

## 2022-11-05 DIAGNOSIS — Z91199 Patient's noncompliance with other medical treatment and regimen due to unspecified reason: Secondary | ICD-10-CM

## 2022-11-05 DIAGNOSIS — Z8249 Family history of ischemic heart disease and other diseases of the circulatory system: Secondary | ICD-10-CM

## 2022-11-05 DIAGNOSIS — Z794 Long term (current) use of insulin: Secondary | ICD-10-CM

## 2022-11-05 DIAGNOSIS — E1122 Type 2 diabetes mellitus with diabetic chronic kidney disease: Secondary | ICD-10-CM | POA: Diagnosis present

## 2022-11-05 LAB — CBC
HCT: 38.8 % (ref 36.0–46.0)
Hemoglobin: 11.3 g/dL — ABNORMAL LOW (ref 12.0–15.0)
MCH: 26.7 pg (ref 26.0–34.0)
MCHC: 29.1 g/dL — ABNORMAL LOW (ref 30.0–36.0)
MCV: 91.7 fL (ref 80.0–100.0)
Platelets: 333 10*3/uL (ref 150–400)
RBC: 4.23 MIL/uL (ref 3.87–5.11)
RDW: 16 % — ABNORMAL HIGH (ref 11.5–15.5)
WBC: 4.8 10*3/uL (ref 4.0–10.5)
nRBC: 0 % (ref 0.0–0.2)

## 2022-11-05 LAB — BASIC METABOLIC PANEL
Anion gap: 9 (ref 5–15)
BUN: 23 mg/dL — ABNORMAL HIGH (ref 6–20)
CO2: 30 mmol/L (ref 22–32)
Calcium: 6.6 mg/dL — ABNORMAL LOW (ref 8.9–10.3)
Chloride: 98 mmol/L (ref 98–111)
Creatinine, Ser: 1.24 mg/dL — ABNORMAL HIGH (ref 0.44–1.00)
GFR, Estimated: 57 mL/min — ABNORMAL LOW (ref >60)
Glucose, Bld: 229 mg/dL — ABNORMAL HIGH (ref 70–99)
Potassium: 4.5 mmol/L (ref 3.5–5.1)
Sodium: 137 mmol/L (ref 135–145)

## 2022-11-05 LAB — BRAIN NATRIURETIC PEPTIDE: B Natriuretic Peptide: 1267.5 pg/mL — ABNORMAL HIGH (ref 0.0–100.0)

## 2022-11-05 LAB — TROPONIN I (HIGH SENSITIVITY)
Troponin I (High Sensitivity): 18 ng/L — ABNORMAL HIGH (ref <18)
Troponin I (High Sensitivity): 17 ng/L (ref <18)

## 2022-11-05 LAB — POC URINE PREG, ED: Preg Test, Ur: NEGATIVE

## 2022-11-05 MED ORDER — FUROSEMIDE 10 MG/ML IJ SOLN
80.0000 mg | Freq: Once | INTRAMUSCULAR | Status: AC
  Administered 2022-11-05: 80 mg via INTRAVENOUS
  Filled 2022-11-05: qty 8

## 2022-11-05 NOTE — ED Triage Notes (Addendum)
Pt to ED via POV c/o fluid retention, sob, cp. Pt reporting bilateral arm and bilateral leg swelling. Pt having central chest tightness and SOB that started 1week ago. Pt takes torsemide but hasn't been able to get a refill on prescription for 2 weeks. Hx of CHF

## 2022-11-05 NOTE — ED Provider Notes (Signed)
[facility] Provider Note    Event Date/Time   First MD Initiated Contact with Patient 11/05/22 2108     (approximate)   History   Shortness of Breath and Chest Pain   HPI  Molly Howard is a 37 y.o. female with a history of CHF, CKD, and GERD who presents with increased edema over the last week, in both legs as well as in her abdomen.  Onset is gradual.  The patient states that she has been off of her torsemide for about 2 weeks.  Her prescription ran out and then she had difficulty filling it.  She also reports increased shortness of breath and states she feels short of breath with minimal exertion although not at rest.  She denies any cough or fever.  She has no chest pain at this time.  I reviewed the past medical records.  The patient was most recently admitted to the hospitalist service in late March for CHF exacerbation in the setting of polysubstance abuse.   Physical Exam   Triage Vital Signs: ED Triage Vitals [11/05/22 1949]  Enc Vitals Group     BP (!) 161/108     Pulse Rate 100     Resp 20     Temp 97.7 F (36.5 C)     Temp Source Oral     SpO2 100 %     Weight 264 lb 8.8 oz (120 kg)     Height 5\' 7"  (1.702 m)     Head Circumference      Peak Flow      Pain Score 8     Pain Loc      Pain Edu?      Excl. in GC?     Most recent vital signs: Vitals:   11/05/22 1949 11/05/22 2210  BP: (!) 161/108 (!) 152/98  Pulse: 100 99  Resp: 20 (!) 28  Temp: 97.7 F (36.5 C)   SpO2: 100% 96%     General: Awake, no distress.  CV:  Good peripheral perfusion.  Resp:  Normal effort.  Somewhat diminished breath sounds bilaterally.  No rales. Abd:  No distention.  Abdominal wall edema. Other:  2+ bilateral lower extremity edema.   ED Results / Procedures / Treatments   Labs (all labs ordered are listed, but only abnormal results are displayed) Labs Reviewed  BRAIN NATRIURETIC PEPTIDE - Abnormal; Notable for the following components:       Result Value   B Natriuretic Peptide 1,267.5 (*)    All other components within normal limits  BASIC METABOLIC PANEL - Abnormal; Notable for the following components:   Glucose, Bld 229 (*)    BUN 23 (*)    Creatinine, Ser 1.24 (*)    Calcium 6.6 (*)    GFR, Estimated 57 (*)    All other components within normal limits  CBC - Abnormal; Notable for the following components:   Hemoglobin 11.3 (*)    MCHC 29.1 (*)    RDW 16.0 (*)    All other components within normal limits  TROPONIN I (HIGH SENSITIVITY) - Abnormal; Notable for the following components:   Troponin I (High Sensitivity) 18 (*)    All other components within normal limits  POC URINE PREG, ED  TROPONIN I (HIGH SENSITIVITY)     EKG  ED ECG REPORT I, Dionne Bucy, the attending physician, personally viewed and interpreted this ECG.  Date: 11/05/2022 EKG Time:  Rate:  Rhythm:  ST/T  Wave abnormalities:  Narrative Interpretation:     RADIOLOGY  Chest x-ray: I independently viewed and interpreted the images; there is vascular congestion with no focal consolidation or edema   PROCEDURES:  Critical Care performed: No  Procedures   MEDICATIONS ORDERED IN ED: Medications  furosemide (LASIX) injection 80 mg (80 mg Intravenous Given 11/05/22 2207)     IMPRESSION / MDM / ASSESSMENT AND PLAN / ED COURSE  I reviewed the triage vital signs and the nursing notes.  37 year old female with PMH as noted above presents with increasing bilateral lower extremity edema, abdominal wall edema, and shortness of breath on exertion after being off of her torsemide for the last 2 weeks.  Exam reveals 2+ bilateral lower extremity edema.  The patient is hypertensive with otherwise normal vital signs.  Differential diagnosis includes, but is not limited to, CHF exacerbation, edema due to CKD or other etiology.  Initial troponin is not significantly elevated.  BNP is elevated.  Creatinine is stable for the patient.  Chest  x-ray shows no edema.  Patient's presentation is most consistent with exacerbation of chronic illness.  The patient is on the cardiac monitor to evaluate for evidence of arrhythmia and/or significant heart rate changes.  We will give IV Lasix and reassess.  ----------------------------------------- 11:30 PM on 11/05/2022 -----------------------------------------   The patient has been given IV Lasix.  However given that degree of edema and her shortness of breath with minimal exertion, she will benefit from admission for further treatment.  I consulted Dr. Allena Katz from the hospitalist service; based on our discussion she agrees to evaluate the patient for admission.   FINAL CLINICAL IMPRESSION(S) / ED DIAGNOSES   Final diagnoses:  Acute on chronic congestive heart failure, unspecified heart failure type (HCC)     Rx / DC Orders   ED Discharge Orders     None        Note:  This document was prepared using Dragon voice recognition software and may include unintentional dictation errors.    Dionne Bucy, MD 11/05/22 2348

## 2022-11-05 NOTE — H&P (Incomplete)
History and Physical    Patient: Molly Howard DOB: Mar 13, 1986 DOA: 11/05/2022 DOS: the patient was seen and examined on 11/05/2022 PCP: Margarita Mail, DO  Patient coming from: Home   Chief Complaint:  Chief Complaint  Patient presents with  . Shortness of Breath  . Chest Pain    HPI: Molly Howard is a 37 y.o. female with medical history significant for reduced ejection fraction heart failure, hypertension, diabetes mellitus, CKD, obesity and history of polysubstance abuse coming to [place] with complaints of swelling and fluid retention in arms and legs along with some central chest tightness and shortness of breath and all has been going on for about a week.  Patient has not been able to fill her medications .Chart review shows primary care note from [facility] patient's noncompliance with cardiology and heart failure clinic patient has had a death in her family and her mom has passed away not kept any of her appointments.  Patient also has past medical history of anemia secondary to menorrhagia is being currently followed by OB/GYN and an ultrasound that showed an endometrial polyp.  Patient's been prescribed oral contraceptives that she has not been able to purchase.  Review of Systems: ROS   Past Medical History:  Diagnosis Date  . Acid reflux   . Chronic HFrEF (heart failure with reduced ejection fraction) (HCC)    a. 10/2019 Echo: EF 35-40%, GrII DD; b. 05/2020 Echo: EF 20-25%, glob HK; c. 10/2020 Echo: EF 30-35%, glob HK. GrII DD, Mildly red RV fxn. Mod TR; d.  05/2021 cMRI: EF 42%, no LGE. Nl RV size/fxn.  . CKD (chronic kidney disease), stage II   . Diabetes mellitus (HCC)   . H/O medication noncompliance   . Hypertension   . Microcytic anemia   . NICM (nonischemic cardiomyopathy) (HCC)    a. 10/2019 Echo: EF 35-40%; b. 10/2019 MV: No ischemia. Small apical defect-->breast attenuation; c. 05/2020 Echo: EF 20-25%; d. 10/2020 Echo: EF 30-35%, glob HK. GrII DD, Mildly red RV  fxn. Mod TR; e. 05/2021 cMRI: EF 42%, no LGE. Nl RV size/fxn.  . Obesity   . Polysubstance abuse [facility])    Past Surgical History:  Procedure Laterality Date  . CHOLECYSTECTOMY    . HERNIA REPAIR    . RIGHT HEART CATH N/A 07/28/2022   Procedure: RIGHT HEART CATH;  Surgeon: Iran Ouch, MD;  Location: [facility] INVASIVE CV LAB;  Service: Cardiovascular;  Laterality: N/A;   Social History:  reports that she quit smoking about 2 years ago. Her smoking use included cigarettes. She smoked an average of .5 packs per day. She has never used smokeless tobacco. She reports that she does not currently use alcohol. She reports that she does not currently use drugs after having used the following drugs: Cocaine.  Allergies  Allergen Reactions  . No [employer] Food Allergies Rash and Other (See Comments)    Lemons    Family History  Problem Relation Age of Onset  . Heart failure Mother        Onset of heart failure 40s.  Died in 05-10-22.  . Diabetes Mother   . Hypertension Father   . Diabetes Father     Prior to Admission medications   Medication Sig Start Date End Date Taking? Authorizing Provider  atorvastatin (LIPITOR) 20 MG tablet Take 1 tablet (20 mg total) by mouth daily. 10/04/22   Margarita Mail, DO  calcium carbonate (OS-CAL - DOSED IN MG OF ELEMENTAL CALCIUM) 1250 (  500 Ca) MG tablet Take 1 tablet (1,250 mg total) by mouth 3 (three) times daily with meals. 08/04/22   [name], MD  carvedilol (COREG) 3.125 MG tablet Take 1 tablet (3.125 mg total) by mouth 2 (two) times daily with a meal. 10/04/22   Margarita Mail, DO  dapagliflozin propanediol (FARXIGA) 10 MG TABS tablet Take 1 tablet (10 mg total) by mouth once daily. 10/04/22   Margarita Mail, DO  digoxin (LANOXIN) 0.125 MG tablet Take 1 tablet (0.125 mg total) by mouth daily. 08/05/22   [name], MD  ferrous sulfate 325 (65 FE) MG tablet Take 1 tablet (325 mg total) by mouth daily with breakfast. 08/04/22 09/03/22  [name], MD  insulin glargine (LANTUS SOLOSTAR) 100 UNIT/ML Solostar Pen Take 10 units in the morning and 15 in the evenings. 10/04/22   Margarita Mail, DO  sacubitril-valsartan (ENTRESTO) 49-51 MG Take 1 tablet by mouth 2 (two) times daily. 10/04/22   Margarita Mail, DO  spironolactone (ALDACTONE) 25 MG tablet Take 1 tablet (25 mg total) by mouth daily. 10/04/22   Margarita Mail, DO  Torsemide 40 MG TABS Take 40 mg by mouth in the morning and at bedtime. 10/04/22   Margarita Mail, DO  Potassium Chloride 40 MEQ/15ML (20%) SOLN Take 40 mEq by mouth 2 (two) times daily. 07/04/20 10/18/20  Debbe Odea, MD     Vitals:   11/05/22 1949 11/05/22 2210  BP: (!) 161/108 (!) 152/98  Pulse: 100 99  Resp: 20 (!) 28  Temp: 97.7 F (36.5 C)   TempSrc: Oral   SpO2: 100% 96%  Weight: 120 kg   Height: 5\' 7"  (1.702 m)    Physical Exam   Labs on Admission: I have personally reviewed following labs and imaging studies  CBC: Recent Labs  Lab 11/05/22 1953  WBC 4.8  HGB 11.3*  HCT 38.8  MCV 91.7  PLT 333   Basic Metabolic Panel: Recent Labs  Lab 11/05/22 1953  NA 137  K 4.5  CL 98  CO2 30  GLUCOSE 229*  BUN 23*  CREATININE 1.24*  CALCIUM 6.6*   GFR: Estimated Creatinine Clearance: 82.5 mL/min (A) (by C-G formula based on SCr of 1.24 mg/dL (H)). Liver Function Tests: No results for input(s): "AST", "ALT", "ALKPHOS", "BILITOT", "PROT", "ALBUMIN" in the last 168 hours. No results for input(s): "LIPASE", "AMYLASE" in the last 168 hours. No results for input(s): "AMMONIA" in the last 168 hours. Coagulation Profile: No results for input(s): "INR", "PROTIME" in the last 168 hours. Cardiac Enzymes: No results for input(s): "CKTOTAL", "CKMB", "CKMBINDEX", "TROPONINI" in the last 168 hours. BNP (last 3 results) No results for input(s): "PROBNP" in the last 8760 hours. HbA1C: No results for input(s): "HGBA1C" in the last 72 hours. CBG: No results for input(s): "GLUCAP" in the  last 168 hours. Lipid Profile: No results for input(s): "CHOL", "HDL", "LDLCALC", "TRIG", "CHOLHDL", "LDLDIRECT" in the last 72 hours. Thyroid Function Tests: No results for input(s): "TSH", "T4TOTAL", "FREET4", "T3FREE", "THYROIDAB" in the last 72 hours. Anemia Panel: No results for input(s): "VITAMINB12", "FOLATE", "FERRITIN", "TIBC", "IRON", "RETICCTPCT" in the last 72 hours. Urine analysis: Urinalysis No results found for: "COLORURINE", "APPEARANCEUR", "LABSPEC", "PHURINE", "GLUCOSEU", "HGBUR", "BILIRUBINUR", "KETONESUR", "PROTEINUR", "UROBILINOGEN", "NITRITE", "LEUKOCYTESUR"    Radiological Exams on Admission: DG Chest 2 View  Result Date: 11/05/2022 CLINICAL DATA:  Chest pain EXAM: CHEST - 2 VIEW COMPARISON:  07/24/2022 FINDINGS: Cardiac shadow is enlarged. Scarring is again seen in the left mid lung. No focal infiltrate  is noted. Mild central vascular congestion is seen. No bony abnormality is noted. IMPRESSION: Mild vascular congestion Electronically Signed   By: Alcide Clever M.D.   On: 11/05/2022 20:16     Data Reviewed: Relevant notes from primary care and specialist visits, past discharge summaries as available in EHR, including Care Everywhere. Prior diagnostic testing as pertinent to current admission diagnoses Updated medications and problem lists for reconciliation ED course, including vitals, labs, imaging, treatment and response to treatment Triage notes, nursing and pharmacy notes and ED provider's notes Notable results as noted in HPI Assessment and Plan: Acute on chronic HFrEF (heart failure with reduced ejection fraction) (HCC)-resolved as of 11/05/2022                       DVT prophylaxis:  ***  Consults:  ***  Advance Care Planning:    Code Status: Prior *** Family Communication:  *** Disposition Plan:  Back to previous home environment  Severity of Illness: {Observation/Inpatient:21159}  Author: Gertha Calkin, MD 11/05/2022 11:59  PM  For on call review [email or web address].

## 2022-11-05 NOTE — H&P (Signed)
History and Physical    Patient: Molly Howard ZOX:[id number] DOB: 03-17-1986 DOA: 11/05/2022 DOS: the patient was seen and examined on 11/06/2022 PCP: Margarita Mail, DO  Patient coming from: Home   Chief Complaint:  Chief Complaint  Patient presents with   Shortness of Breath   Chest Pain    HPI: Molly Howard is a 37 y.o. female with medical history significant for reduced ejection fraction heart failure, hypertension, diabetes mellitus, CKD, obesity and history of polysubstance abuse coming to [place] with complaints of swelling and fluid retention in arms and legs along with some central chest tightness and shortness of breath and all has been going on for about a week.  Patient states the chest pain is off and on it is really a chest tightness that is worse only when she is sleeping and it resolves without any medications.  She has not tried any intervention.  Patient has not been able to fill her medications .Chart review shows primary care note from [facility] patient's noncompliance with cardiology and heart failure clinic patient has had a death in her family and her mom has passed away not kept any of her appointments.  Patient also has past medical history of anemia secondary to menorrhagia is being currently followed by OB/GYN and an ultrasound that showed an endometrial polyp.  Patient's been prescribed oral contraceptives that she has not been able to purchase.  Patient's most recent echocardiogram done earlier this year in March showed an EF of 39%. Review of Systems: Review of Systems  Cardiovascular:  Positive for chest pain, orthopnea and leg swelling.  All other systems reviewed and are negative.  Past Medical History:  Diagnosis Date   Acid reflux    Chronic HFrEF (heart failure with reduced ejection fraction) (HCC)    a. 10/2019 Echo: EF 35-40%, GrII DD; b. 05/2020 Echo: EF 20-25%, glob HK; c. 10/2020 Echo: EF 30-35%, glob HK. GrII DD, Mildly red RV fxn. Mod TR; d.  05/2021 cMRI: EF  42%, no LGE. Nl RV size/fxn.   CKD (chronic kidney disease), stage II    Diabetes mellitus (HCC)    H/O medication noncompliance    Hypertension    Microcytic anemia    NICM (nonischemic cardiomyopathy) (HCC)    a. 10/2019 Echo: EF 35-40%; b. 10/2019 MV: No ischemia. Small apical defect-->breast attenuation; c. 05/2020 Echo: EF 20-25%; d. 10/2020 Echo: EF 30-35%, glob HK. GrII DD, Mildly red RV fxn. Mod TR; e. 05/2021 cMRI: EF 42%, no LGE. Nl RV size/fxn.   Obesity    Polysubstance abuse [facility])    Past Surgical History:  Procedure Laterality Date   CHOLECYSTECTOMY     HERNIA REPAIR     RIGHT HEART CATH N/A 07/28/2022   Procedure: RIGHT HEART CATH;  Surgeon: Iran Ouch, MD;  Location: [facility] INVASIVE CV LAB;  Service: Cardiovascular;  Laterality: N/A;   Social History:  reports that she quit smoking about 2 years ago. Her smoking use included cigarettes. She smoked an average of .5 packs per day. She has never used smokeless tobacco. She reports that she does not currently use alcohol. She reports that she does not currently use drugs after having used the following drugs: Cocaine.  Allergies  Allergen Reactions   No [employer] Food Allergies Rash and Other (See Comments)    Lemons   Family History  Problem Relation Age of Onset   Heart failure Mother        Onset of heart failure 107s.  Died in December 2023.   Diabetes Mother    Hypertension Father    Diabetes Father    Prior to Admission medications   Medication Sig Start Date End Date Taking? Authorizing Provider  atorvastatin (LIPITOR) 20 MG tablet Take 1 tablet (20 mg total) by mouth daily. 10/04/22   Margarita Mail, DO  calcium carbonate (OS-CAL - DOSED IN MG OF ELEMENTAL CALCIUM) 1250 (500 Ca) MG tablet Take 1 tablet (1,250 mg total) by mouth 3 (three) times daily with meals. 08/04/22   [name], MD  carvedilol (COREG) 3.125 MG tablet Take 1 tablet (3.125 mg total) by mouth 2 (two) times daily with a meal. 10/04/22    Margarita Mail, DO  dapagliflozin propanediol (FARXIGA) 10 MG TABS tablet Take 1 tablet (10 mg total) by mouth once daily. 10/04/22   Margarita Mail, DO  digoxin (LANOXIN) 0.125 MG tablet Take 1 tablet (0.125 mg total) by mouth daily. 08/05/22   [name], MD  ferrous sulfate 325 (65 FE) MG tablet Take 1 tablet (325 mg total) by mouth daily with breakfast. 08/04/22 09/03/22  [name], MD  insulin glargine (LANTUS SOLOSTAR) 100 UNIT/ML Solostar Pen Take 10 units in the morning and 15 in the evenings. 10/04/22   Margarita Mail, DO  sacubitril-valsartan (ENTRESTO) 49-51 MG Take 1 tablet by mouth 2 (two) times daily. 10/04/22   Margarita Mail, DO  spironolactone (ALDACTONE) 25 MG tablet Take 1 tablet (25 mg total) by mouth daily. 10/04/22   Margarita Mail, DO  Torsemide 40 MG TABS Take 40 mg by mouth in the morning and at bedtime. 10/04/22   Margarita Mail, DO  Potassium Chloride 40 MEQ/15ML (20%) SOLN Take 40 mEq by mouth 2 (two) times daily. 07/04/20 10/18/20  Debbe Odea, MD   Vitals:   11/05/22 1949 11/05/22 2210 11/06/22 0045  BP: (!) 161/108 (!) 152/98   Pulse: 100 99   Resp: 20 (!) 28 19  Temp: 97.7 F (36.5 C)    TempSrc: Oral    SpO2: 100% 96%   Weight: 120 kg    Height: 5\' 7"  (1.702 m)     Physical Exam Vitals and nursing note reviewed.  Constitutional:      General: She is not in acute distress. HENT:     Head: Normocephalic and atraumatic.     Right Ear: Hearing normal.     Left Ear: Hearing normal.     Nose: Nose normal. No nasal deformity.     Mouth/Throat:     Lips: Pink.     Tongue: No lesions.     Pharynx: Oropharynx is clear.  Eyes:     General: Lids are normal.     Extraocular Movements: Extraocular movements intact.  Cardiovascular:     Rate and Rhythm: Normal rate and regular rhythm.     Pulses:          Dorsalis pedis pulses are 2+ on the right side and 2+ on the left side.       Posterior tibial pulses are 2+ on the right side and 2+ on  the left side.     Heart sounds: Normal heart sounds.  Pulmonary:     Effort: Pulmonary effort is normal.     Breath sounds: Examination of the right-lower field reveals rales. Examination of the left-lower field reveals rales. Rales present.  Abdominal:     General: Bowel sounds are normal. There is no distension.     Palpations: Abdomen is soft. There is no mass.  Tenderness: There is no abdominal tenderness.  Musculoskeletal:     Right lower leg: Edema present.     Left lower leg: Edema present.  Skin:    General: Skin is warm.  Neurological:     General: No focal deficit present.     Mental Status: She is alert and oriented to person, place, and time.     Cranial Nerves: Cranial nerves 2-12 are intact.  Psychiatric:        Attention and Perception: Attention normal.        Mood and Affect: Mood normal.        Speech: Speech normal.        Behavior: Behavior normal. Behavior is cooperative.     Labs on Admission: I have personally reviewed following labs and imaging studies  CBC: Recent Labs  Lab 11/05/22 1953  WBC 4.8  HGB 11.3*  HCT 38.8  MCV 91.7  PLT 333   Basic Metabolic Panel: Recent Labs  Lab 11/05/22 1953  NA 137  K 4.5  CL 98  CO2 30  GLUCOSE 229*  BUN 23*  CREATININE 1.24*  CALCIUM 6.6*   GFR: Estimated Creatinine Clearance: 82.5 mL/min (A) (by C-G formula based on SCr of 1.24 mg/dL (H)). Liver Function Tests: Recent Labs  Lab 11/05/22 2159  AST 20  ALT 13  ALKPHOS 164*  BILITOT 0.6  PROT 6.1*  ALBUMIN 1.7*   No results for input(s): "LIPASE", "AMYLASE" in the last 168 hours. No results for input(s): "AMMONIA" in the last 168 hours. Coagulation Profile: No results for input(s): "INR", "PROTIME" in the last 168 hours. Cardiac Enzymes: No results for input(s): "CKTOTAL", "CKMB", "CKMBINDEX", "TROPONINI" in the last 168 hours. BNP (last 3 results) No results for input(s): "PROBNP" in the last 8760 hours. HbA1C: No results for  input(s): "HGBA1C" in the last 72 hours. CBG: No results for input(s): "GLUCAP" in the last 168 hours. Lipid Profile: No results for input(s): "CHOL", "HDL", "LDLCALC", "TRIG", "CHOLHDL", "LDLDIRECT" in the last 72 hours. Thyroid Function Tests: No results for input(s): "TSH", "T4TOTAL", "FREET4", "T3FREE", "THYROIDAB" in the last 72 hours. Anemia Panel: No results for input(s): "VITAMINB12", "FOLATE", "FERRITIN", "TIBC", "IRON", "RETICCTPCT" in the last 72 hours. Urine analysis: Urinalysis No results found for: "COLORURINE", "APPEARANCEUR", "LABSPEC", "PHURINE", "GLUCOSEU", "HGBUR", "BILIRUBINUR", "KETONESUR", "PROTEINUR", "UROBILINOGEN", "NITRITE", "LEUKOCYTESUR"  Radiological Exams on Admission: DG Chest 2 View  Result Date: 11/05/2022 CLINICAL DATA:  Chest pain EXAM: CHEST - 2 VIEW COMPARISON:  07/24/2022 FINDINGS: Cardiac shadow is enlarged. Scarring is again seen in the left mid lung. No focal infiltrate is noted. Mild central vascular congestion is seen. No bony abnormality is noted. IMPRESSION: Mild vascular congestion Electronically Signed   By: Alcide Clever M.D.   On: 11/05/2022 20:16    Data Reviewed: Relevant notes from primary care and specialist visits, past discharge summaries as available in EHR, including Care Everywhere. Prior diagnostic testing as pertinent to current admission diagnoses Updated medications and problem lists for reconciliation ED course, including vitals, labs, imaging, treatment and response to treatment Triage notes, nursing and pharmacy notes and ED provider's notes Notable results as noted in HPI Assessment and Plan: * Acute on chronic combined systolic and diastolic CHF (congestive heart failure) (HCC) Patient presenting with volume overload and edema of both her arms and legs.  Will admit patient to progressive unit and continue with the Lasix 40 mg IV twice daily with daily weight checks and strict I's and O's.  Fluid restriction and sodium  restriction.  Will continue patient on her low-dose Coreg and digoxin currently we will hold Aldactone torsemide and Entresto.  Chronic anemia Hemoglobin of 11.3 today and has remained stable over the past month.  Will follow continue with IV PPI type and screen.  Essential hypertension Vitals:   11/05/22 1949 11/05/22 2210  BP: (!) 161/108 (!) 152/98  Home medication regimen consist of Aldactone, Entresto, Coreg. We will hold Aldactone and Entresto because of abnormal renal function and continue patient on Coreg and as needed Lasix. Type and screen, IV PPI.  Diabetes mellitus, type II (HCC) Repeated A1c previous 1 is over 10 reflecting poorly controlled diabetes mellitus type 2. Currently we will manage patient on glycemic protocol. Continue with Lantus home regimen. Glycemic protocol.  Chronic kidney disease (CKD), stage II (mild) Lab Results  Component Value Date   CREATININE 1.24 (H) 11/05/2022   CREATININE 1.45 (H) 10/04/2022   CREATININE 1.52 (H) 08/04/2022  Creatinine trend is stable we will avoid contrast studies nephrotoxic agents meds and renally dose.  We will currently also hold Aldactone and Entresto as patient is in need of diuretic therapy to allow room for diuresis for CHF exacerbation and resume once patient is euvolemic.   GERD without esophagitis IV PPI therapy.   Cocaine abuse (HCC) Urine drug screen is pending. This also was causing worsening congestive heart failure and heart disease in this patient.  Counseling per a.m. team.  Hypomagnesemia History of hypomagnesemia will replace and follow levels.  Tobacco use Nicotine patch. Counseling once stable.  Swelling Swelling in both her arms and legs consistent with her anasarca picture and CHF. Continue with diuretic therapy and electrolyte management along with monitoring kidney functions.  Acute on chronic HFrEF (heart failure with reduced ejection fraction) (HCC)-resolved as of  11/05/2022                  DVT prophylaxis:  Heparin   Consults:  None   Advance Care Planning:    Code Status: Full Code  Family Communication:  None  Disposition Plan:  Back to previous home environment  Severity of Illness: The appropriate patient status for this patient is INPATIENT. Inpatient status is judged to be reasonable and necessary in order to provide the required intensity of service to ensure the patient's safety. The patient's presenting symptoms, physical exam findings, and initial radiographic and laboratory data in the context of their chronic comorbidities is felt to place them at high risk for further clinical deterioration. Furthermore, it is not anticipated that the patient will be medically stable for discharge from the hospital within 2 midnights of admission.   * I certify that at the point of admission it is my clinical judgment that the patient will require inpatient hospital care spanning beyond 2 midnights from the point of admission due to high intensity of service, high risk for further deterioration and high frequency of surveillance required.*  Author: Gertha Calkin, MD 11/06/2022 1:27 AM  For on call review [email or web address].

## 2022-11-06 ENCOUNTER — Inpatient Hospital Stay: Payer: Medicaid Other

## 2022-11-06 DIAGNOSIS — Z720 Tobacco use: Secondary | ICD-10-CM | POA: Diagnosis not present

## 2022-11-06 DIAGNOSIS — E1165 Type 2 diabetes mellitus with hyperglycemia: Secondary | ICD-10-CM | POA: Diagnosis not present

## 2022-11-06 DIAGNOSIS — F17200 Nicotine dependence, unspecified, uncomplicated: Secondary | ICD-10-CM | POA: Diagnosis present

## 2022-11-06 DIAGNOSIS — H538 Other visual disturbances: Secondary | ICD-10-CM | POA: Diagnosis not present

## 2022-11-06 DIAGNOSIS — I5082 Biventricular heart failure: Secondary | ICD-10-CM | POA: Diagnosis present

## 2022-11-06 DIAGNOSIS — I10 Essential (primary) hypertension: Secondary | ICD-10-CM | POA: Diagnosis not present

## 2022-11-06 DIAGNOSIS — N182 Chronic kidney disease, stage 2 (mild): Secondary | ICD-10-CM | POA: Diagnosis present

## 2022-11-06 DIAGNOSIS — K219 Gastro-esophageal reflux disease without esophagitis: Secondary | ICD-10-CM | POA: Diagnosis present

## 2022-11-06 DIAGNOSIS — Z6841 Body Mass Index (BMI) 40.0 and over, adult: Secondary | ICD-10-CM | POA: Diagnosis not present

## 2022-11-06 DIAGNOSIS — Z794 Long term (current) use of insulin: Secondary | ICD-10-CM | POA: Diagnosis not present

## 2022-11-06 DIAGNOSIS — I5023 Acute on chronic systolic (congestive) heart failure: Secondary | ICD-10-CM | POA: Diagnosis not present

## 2022-11-06 DIAGNOSIS — Z91199 Patient's noncompliance with other medical treatment and regimen due to unspecified reason: Secondary | ICD-10-CM | POA: Diagnosis not present

## 2022-11-06 DIAGNOSIS — M7989 Other specified soft tissue disorders: Secondary | ICD-10-CM | POA: Diagnosis not present

## 2022-11-06 DIAGNOSIS — Z833 Family history of diabetes mellitus: Secondary | ICD-10-CM | POA: Diagnosis not present

## 2022-11-06 DIAGNOSIS — I472 Ventricular tachycardia, unspecified: Secondary | ICD-10-CM | POA: Diagnosis present

## 2022-11-06 DIAGNOSIS — E875 Hyperkalemia: Secondary | ICD-10-CM | POA: Diagnosis not present

## 2022-11-06 DIAGNOSIS — I071 Rheumatic tricuspid insufficiency: Secondary | ICD-10-CM | POA: Diagnosis present

## 2022-11-06 DIAGNOSIS — I428 Other cardiomyopathies: Secondary | ICD-10-CM | POA: Diagnosis present

## 2022-11-06 DIAGNOSIS — R600 Localized edema: Secondary | ICD-10-CM | POA: Diagnosis present

## 2022-11-06 DIAGNOSIS — E1122 Type 2 diabetes mellitus with diabetic chronic kidney disease: Secondary | ICD-10-CM | POA: Diagnosis present

## 2022-11-06 DIAGNOSIS — Z8249 Family history of ischemic heart disease and other diseases of the circulatory system: Secondary | ICD-10-CM | POA: Diagnosis not present

## 2022-11-06 DIAGNOSIS — I5043 Acute on chronic combined systolic (congestive) and diastolic (congestive) heart failure: Secondary | ICD-10-CM | POA: Diagnosis not present

## 2022-11-06 DIAGNOSIS — I130 Hypertensive heart and chronic kidney disease with heart failure and stage 1 through stage 4 chronic kidney disease, or unspecified chronic kidney disease: Secondary | ICD-10-CM | POA: Diagnosis present

## 2022-11-06 DIAGNOSIS — I63522 Cerebral infarction due to unspecified occlusion or stenosis of left anterior cerebral artery: Secondary | ICD-10-CM | POA: Diagnosis not present

## 2022-11-06 DIAGNOSIS — E872 Acidosis, unspecified: Secondary | ICD-10-CM | POA: Diagnosis not present

## 2022-11-06 DIAGNOSIS — Z79899 Other long term (current) drug therapy: Secondary | ICD-10-CM | POA: Diagnosis not present

## 2022-11-06 DIAGNOSIS — T502X5A Adverse effect of carbonic-anhydrase inhibitors, benzothiadiazides and other diuretics, initial encounter: Secondary | ICD-10-CM | POA: Diagnosis not present

## 2022-11-06 DIAGNOSIS — R601 Generalized edema: Secondary | ICD-10-CM | POA: Diagnosis not present

## 2022-11-06 DIAGNOSIS — D649 Anemia, unspecified: Secondary | ICD-10-CM | POA: Diagnosis not present

## 2022-11-06 DIAGNOSIS — I361 Nonrheumatic tricuspid (valve) insufficiency: Secondary | ICD-10-CM | POA: Diagnosis not present

## 2022-11-06 DIAGNOSIS — F1410 Cocaine abuse, uncomplicated: Secondary | ICD-10-CM | POA: Diagnosis present

## 2022-11-06 LAB — URINE DRUG SCREEN, QUALITATIVE (ARMC ONLY)
Amphetamines, Ur Screen: NOT DETECTED
Barbiturates, Ur Screen: NOT DETECTED
Benzodiazepine, Ur Scrn: NOT DETECTED
Cannabinoid 50 Ng, Ur ~~LOC~~: NOT DETECTED
Cocaine Metabolite,Ur ~~LOC~~: POSITIVE — AB
MDMA (Ecstasy)Ur Screen: NOT DETECTED
Methadone Scn, Ur: NOT DETECTED
Opiate, Ur Screen: NOT DETECTED
Phencyclidine (PCP) Ur S: NOT DETECTED
Tricyclic, Ur Screen: NOT DETECTED

## 2022-11-06 LAB — HEPATIC FUNCTION PANEL
ALT: 13 U/L (ref 0–44)
AST: 20 U/L (ref 15–41)
Albumin: 1.7 g/dL — ABNORMAL LOW (ref 3.5–5.0)
Alkaline Phosphatase: 164 U/L — ABNORMAL HIGH (ref 38–126)
Bilirubin, Direct: <0.1 mg/dL (ref 0.0–0.2)
Total Bilirubin: 0.6 mg/dL (ref 0.3–1.2)
Total Protein: 6.1 g/dL — ABNORMAL LOW (ref 6.5–8.1)

## 2022-11-06 LAB — COMPREHENSIVE METABOLIC PANEL
ALT: 11 U/L (ref 0–44)
AST: 18 U/L (ref 15–41)
Albumin: 1.8 g/dL — ABNORMAL LOW (ref 3.5–5.0)
Alkaline Phosphatase: 167 U/L — ABNORMAL HIGH (ref 38–126)
Anion gap: 10 (ref 5–15)
BUN: 23 mg/dL — ABNORMAL HIGH (ref 6–20)
CO2: 26 mmol/L (ref 22–32)
Calcium: 6.5 mg/dL — ABNORMAL LOW (ref 8.9–10.3)
Chloride: 99 mmol/L (ref 98–111)
Creatinine, Ser: 1.11 mg/dL — ABNORMAL HIGH (ref 0.44–1.00)
GFR, Estimated: >60 mL/min (ref >60)
Glucose, Bld: 262 mg/dL — ABNORMAL HIGH (ref 70–99)
Potassium: 4.1 mmol/L (ref 3.5–5.1)
Sodium: 135 mmol/L (ref 135–145)
Total Bilirubin: 0.7 mg/dL (ref 0.3–1.2)
Total Protein: 6 g/dL — ABNORMAL LOW (ref 6.5–8.1)

## 2022-11-06 LAB — MAGNESIUM: Magnesium: 1.4 mg/dL — ABNORMAL LOW (ref 1.7–2.4)

## 2022-11-06 LAB — T4, FREE: Free T4: 1.03 ng/dL (ref 0.61–1.12)

## 2022-11-06 LAB — CBC WITH DIFFERENTIAL/PLATELET
Abs Immature Granulocytes: 0.01 10*3/uL (ref 0.00–0.07)
Basophils Absolute: 0.1 10*3/uL (ref 0.0–0.1)
Basophils Relative: 1 %
Eosinophils Absolute: 0.2 10*3/uL (ref 0.0–0.5)
Eosinophils Relative: 4 %
HCT: 36.5 % (ref 36.0–46.0)
Hemoglobin: 10.6 g/dL — ABNORMAL LOW (ref 12.0–15.0)
Immature Granulocytes: 0 %
Lymphocytes Relative: 16 %
Lymphs Abs: 0.8 10*3/uL (ref 0.7–4.0)
MCH: 26.2 pg (ref 26.0–34.0)
MCHC: 29 g/dL — ABNORMAL LOW (ref 30.0–36.0)
MCV: 90.1 fL (ref 80.0–100.0)
Monocytes Absolute: 0.3 10*3/uL (ref 0.1–1.0)
Monocytes Relative: 7 %
Neutro Abs: 3.3 10*3/uL (ref 1.7–7.7)
Neutrophils Relative %: 72 %
Platelets: 336 10*3/uL (ref 150–400)
RBC: 4.05 MIL/uL (ref 3.87–5.11)
RDW: 16 % — ABNORMAL HIGH (ref 11.5–15.5)
WBC: 4.7 10*3/uL (ref 4.0–10.5)
nRBC: 0 % (ref 0.0–0.2)

## 2022-11-06 LAB — GLUCOSE, CAPILLARY
Glucose-Capillary: 237 mg/dL — ABNORMAL HIGH (ref 70–99)
Glucose-Capillary: 176 mg/dL — ABNORMAL HIGH (ref 70–99)
Glucose-Capillary: 186 mg/dL — ABNORMAL HIGH (ref 70–99)
Glucose-Capillary: 259 mg/dL — ABNORMAL HIGH (ref 70–99)

## 2022-11-06 LAB — HEMOGLOBIN A1C
Hgb A1c MFr Bld: 11.7 % — ABNORMAL HIGH (ref 4.8–5.6)
Mean Plasma Glucose: 289.09 mg/dL

## 2022-11-06 LAB — DIGOXIN LEVEL: Digoxin Level: 0.3 ng/mL — ABNORMAL LOW (ref 0.8–2.0)

## 2022-11-06 MED ORDER — GUAIFENESIN 100 MG/5ML PO LIQD
5.0000 mL | Freq: Four times a day (QID) | ORAL | Status: DC | PRN
  Administered 2022-11-06 – 2022-11-10 (×6): 5 mL via ORAL
  Filled 2022-11-06 (×7): qty 10

## 2022-11-06 MED ORDER — DEXTROMETHORPHAN POLISTIREX ER 30 MG/5ML PO SUER
30.0000 mg | Freq: Once | ORAL | Status: AC
  Administered 2022-11-06: 30 mg via ORAL
  Filled 2022-11-06: qty 5

## 2022-11-06 MED ORDER — HEPARIN SODIUM (PORCINE) 5000 UNIT/ML IJ SOLN
5000.0000 [IU] | Freq: Three times a day (TID) | INTRAMUSCULAR | Status: DC
  Filled 2022-11-06 (×2): qty 1

## 2022-11-06 MED ORDER — NITROGLYCERIN 0.4 MG SL SUBL: 0.4000 mg | SUBLINGUAL_TABLET | SUBLINGUAL | Status: DC | PRN

## 2022-11-06 MED ORDER — PANTOPRAZOLE SODIUM 40 MG IV SOLR
40.0000 mg | Freq: Two times a day (BID) | INTRAVENOUS | Status: DC
  Administered 2022-11-06 (×2): 40 mg via INTRAVENOUS
  Filled 2022-11-06 (×2): qty 10

## 2022-11-06 MED ORDER — ACETAMINOPHEN 325 MG PO TABS
650.0000 mg | ORAL_TABLET | Freq: Four times a day (QID) | ORAL | Status: DC | PRN
  Administered 2022-11-11: 650 mg via ORAL
  Filled 2022-11-06: qty 2

## 2022-11-06 MED ORDER — ONDANSETRON HCL 4 MG PO TABS: 4.0000 mg | ORAL_TABLET | Freq: Four times a day (QID) | ORAL | Status: DC | PRN

## 2022-11-06 MED ORDER — HYDRALAZINE HCL 20 MG/ML IJ SOLN: 5.0000 mg | INTRAMUSCULAR | Status: DC | PRN

## 2022-11-06 MED ORDER — CARVEDILOL 3.125 MG PO TABS
3.1250 mg | ORAL_TABLET | Freq: Two times a day (BID) | ORAL | Status: DC
  Administered 2022-11-06 – 2022-11-15 (×20): 3.125 mg via ORAL
  Filled 2022-11-06 (×20): qty 1

## 2022-11-06 MED ORDER — ONDANSETRON HCL 4 MG/2ML IJ SOLN: 4.0000 mg | Freq: Four times a day (QID) | INTRAMUSCULAR | Status: DC | PRN

## 2022-11-06 MED ORDER — SODIUM CHLORIDE 0.9% FLUSH
3.0000 mL | Freq: Two times a day (BID) | INTRAVENOUS | Status: DC
  Administered 2022-11-06 – 2022-11-15 (×19): 3 mL via INTRAVENOUS

## 2022-11-06 MED ORDER — FUROSEMIDE 10 MG/ML IJ SOLN
40.0000 mg | Freq: Two times a day (BID) | INTRAMUSCULAR | Status: DC
  Administered 2022-11-06 – 2022-11-08 (×5): 40 mg via INTRAVENOUS
  Filled 2022-11-06 (×5): qty 4

## 2022-11-06 MED ORDER — PANTOPRAZOLE SODIUM 40 MG PO TBEC
40.0000 mg | DELAYED_RELEASE_TABLET | Freq: Two times a day (BID) | ORAL | Status: DC
  Administered 2022-11-06 – 2022-11-10 (×8): 40 mg via ORAL
  Filled 2022-11-06 (×8): qty 1

## 2022-11-06 MED ORDER — ACETAMINOPHEN 650 MG RE SUPP: 650.0000 mg | Freq: Four times a day (QID) | RECTAL | Status: DC | PRN

## 2022-11-06 MED ORDER — ENSURE MAX PROTEIN PO LIQD
11.0000 [oz_av] | Freq: Every day | ORAL | Status: DC
  Administered 2022-11-07 – 2022-11-08 (×2): 11 [oz_av] via ORAL
  Filled 2022-11-06: qty 330

## 2022-11-06 MED ORDER — INSULIN GLARGINE-YFGN 100 UNIT/ML ~~LOC~~ SOLN
10.0000 [IU] | Freq: Every day | SUBCUTANEOUS | Status: DC
  Administered 2022-11-06 – 2022-11-09 (×5): 10 [IU] via SUBCUTANEOUS
  Filled 2022-11-06 (×6): qty 0.1

## 2022-11-06 MED ORDER — INSULIN ASPART 100 UNIT/ML IJ SOLN
0.0000 [IU] | Freq: Three times a day (TID) | INTRAMUSCULAR | Status: DC
  Administered 2022-11-06: 5 [IU] via SUBCUTANEOUS
  Administered 2022-11-06 (×2): 3 [IU] via SUBCUTANEOUS
  Administered 2022-11-07: 5 [IU] via SUBCUTANEOUS
  Administered 2022-11-07: 3 [IU] via SUBCUTANEOUS
  Administered 2022-11-08 (×2): 2 [IU] via SUBCUTANEOUS
  Administered 2022-11-08 – 2022-11-09 (×2): 5 [IU] via SUBCUTANEOUS
  Administered 2022-11-09: 3 [IU] via SUBCUTANEOUS
  Administered 2022-11-09: 2 [IU] via SUBCUTANEOUS
  Administered 2022-11-10 – 2022-11-11 (×4): 3 [IU] via SUBCUTANEOUS
  Administered 2022-11-12: 2 [IU] via SUBCUTANEOUS
  Administered 2022-11-12 – 2022-11-13 (×3): 3 [IU] via SUBCUTANEOUS
  Administered 2022-11-13: 5 [IU] via SUBCUTANEOUS
  Administered 2022-11-13: 3 [IU] via SUBCUTANEOUS
  Administered 2022-11-14: 5 [IU] via SUBCUTANEOUS
  Administered 2022-11-14: 3 [IU] via SUBCUTANEOUS
  Administered 2022-11-14 – 2022-11-15 (×2): 5 [IU] via SUBCUTANEOUS
  Administered 2022-11-15: 3 [IU] via SUBCUTANEOUS
  Filled 2022-11-06 (×26): qty 1

## 2022-11-06 MED ORDER — ADULT MULTIVITAMIN W/MINERALS CH
1.0000 | ORAL_TABLET | Freq: Every day | ORAL | Status: DC
  Administered 2022-11-06 – 2022-11-15 (×10): 1 via ORAL
  Filled 2022-11-06 (×9): qty 1

## 2022-11-06 MED ORDER — MAGNESIUM SULFATE 2 GM/50ML IV SOLN
2.0000 g | Freq: Once | INTRAVENOUS | Status: AC
  Administered 2022-11-06: 2 g via INTRAVENOUS
  Filled 2022-11-06: qty 50

## 2022-11-06 MED ORDER — DIGOXIN 125 MCG PO TABS
0.1250 mg | ORAL_TABLET | Freq: Every day | ORAL | Status: DC
  Administered 2022-11-06 – 2022-11-15 (×10): 0.125 mg via ORAL
  Filled 2022-11-06 (×12): qty 1

## 2022-11-06 MED ORDER — ATORVASTATIN CALCIUM 20 MG PO TABS
20.0000 mg | ORAL_TABLET | Freq: Every day | ORAL | Status: DC
  Administered 2022-11-06 – 2022-11-15 (×10): 20 mg via ORAL
  Filled 2022-11-06 (×10): qty 1

## 2022-11-06 NOTE — Assessment & Plan Note (Signed)
Nicotine patch. Counseling once stable.

## 2022-11-06 NOTE — Assessment & Plan Note (Signed)
Stable iron deficiency anemia with a hemoglobin of 11.3 RDW of 16 and platelet counts of 333.  Type and screen, IV PPI.

## 2022-11-06 NOTE — Assessment & Plan Note (Signed)
Patient presenting with volume overload and edema of both her arms and legs.  Will admit patient to progressive unit and continue with the Lasix 40 mg IV twice daily with daily weight checks and strict I's and O's.  Fluid restriction and sodium restriction.  Will continue patient on her low-dose Coreg and digoxin currently we will hold Aldactone torsemide and Entresto.

## 2022-11-06 NOTE — Assessment & Plan Note (Signed)
IV PPI therapy.  

## 2022-11-06 NOTE — Assessment & Plan Note (Signed)
Urine drug screen is pending. This also was causing worsening congestive heart failure and heart disease in this patient.  Counseling per a.m. team.

## 2022-11-06 NOTE — Assessment & Plan Note (Signed)
Vitals:   11/05/22 1949 11/05/22 2210  BP: (!) 161/108 (!) 152/98  Home medication regimen consist of Aldactone, Entresto, Coreg. We will hold Aldactone and Entresto because of abnormal renal function and continue patient on Coreg and as needed Lasix. Type and screen, IV PPI.

## 2022-11-06 NOTE — Discharge Instructions (Signed)

## 2022-11-06 NOTE — Progress Notes (Addendum)
PROGRESS NOTE    Molly Howard  ZOX:[id number] DOB: Feb 15, 1986 DOA: 11/05/2022 PCP: Margarita Mail, DO   Assessment & Plan:   Principal Problem:   Acute on chronic combined systolic and diastolic CHF (congestive heart failure) (HCC) Active Problems:   Chronic anemia   Essential hypertension   Diabetes mellitus, type II (HCC)   Chronic kidney disease (CKD), stage II (mild)   GERD without esophagitis   Swelling   Tobacco use   Hypomagnesemia   Cocaine abuse (HCC)   Bilateral leg edema  Assessment and Plan: Acute on chronic combined CHF exacerbation: continue on IV lasix. Monitor I/Os and daily weights. CXR shows mild vascular congestion. Continue on home dose of coreg. Holding home torsemide, entresto & aldactone. 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 No need for a transfusion currently  HTN: continue on home dose of coreg. Holding entresto, aldactone   DM2: HbA1c 11.7, poorly controlled. Continue on glargine, SSI w/ accuchecks  CKDII: Cr is trending down slightly today. Avoid nephrotoxic meds  Cocaine abuse: urine drug screen was positive for cocaine. Cocaine use cessation counseling  Hypomagnesemia: mg sulfate ordered  Smoker: nicotine patch to prevent w/drawal. Smoking cessation counseling x 5 mins  Morbid obesity: BMI 44.8. Complicates overall care & prognosis       DVT prophylaxis: heparin SQ  Code Status: full  Family Communication: Disposition Plan: likely d/c back home   Level of care: Telemetry Cardiac Status is: Inpatient Remains inpatient appropriate because: requiring IV lasix     Consultants:    Procedures:   Antimicrobials:    Subjective: Pt c/o swelling of legs   Objective: Vitals:   11/06/22 0208 11/06/22 0337 11/06/22 0729 11/06/22 1223  BP: (!) 153/101 (!) 146/110 (!) 149/109 (!) 134/96  Pulse: (!) 101 99 95 92  Resp: 20 20 20 20    Temp: 97.8 F (36.6 C) 98.3 F (36.8 C) 98.1 F (36.7 C) 97.6 F (36.4 C)  TempSrc:      SpO2: 100% 98% 97% 100%  Weight: 130 kg     Height: 5\' 7"  (1.702 m)       Intake/Output Summary (Last 24 hours) at 11/06/2022 1402 Last data filed at 11/06/2022 1037 Gross per 24 hour  Intake 240 ml  Output 200 ml  Net 40 ml   Filed Weights   11/05/22 1949 11/06/22 0208  Weight: 120 kg 130 kg    Examination:  General exam: Appears calm and comfortable  Respiratory system: Clear to auscultation. Respiratory effort normal. Cardiovascular system: S1 & S2+. No  rubs, gallops or clicks. 1+ pitting edema of b/l LE Gastrointestinal system: Abdomen is nondistended, soft and nontender.  Normal bowel sounds heard. Central nervous system: Alert and oriented. Moves all extremities  Psychiatry: Judgement and insight appear normal. Mood & affect appropriate.     Data Reviewed: I have personally reviewed following labs and imaging studies  CBC: Recent Labs  Lab 11/05/22 1953 11/06/22 0223  WBC 4.8 4.7  NEUTROABS  --  3.3  HGB 11.3* 10.6*  HCT 38.8 36.5  MCV 91.7 90.1  PLT 333 336   Basic Metabolic Panel: Recent Labs  Lab 11/05/22 1953 11/06/22 0223  NA 137 135  K 4.5 4.1  CL 98 99  CO2 30 26  GLUCOSE 229* 262*  BUN 23* 23*  CREATININE 1.24* 1.11*  CALCIUM 6.6* 6.5*  MG  --  1.4*   GFR: Estimated Creatinine Clearance: 96.6 mL/min (A) (by C-G formula based on SCr of 1.11 mg/dL (H)). Liver Function Tests: Recent Labs  Lab 11/05/22 2159 11/06/22 0223  AST 20 18  ALT 13 11  ALKPHOS 164* 167*  BILITOT 0.6 0.7  PROT 6.1* 6.0*  ALBUMIN 1.7* 1.8*   No results for input(s): "LIPASE", "AMYLASE" in the last 168 hours. No results for input(s): "AMMONIA" in the last 168 hours. Coagulation Profile: No results for input(s): "INR", "PROTIME" in the last 168 hours. Cardiac Enzymes: No results for input(s): "CKTOTAL", "CKMB", "CKMBINDEX", "TROPONINI" in the last 168 hours. BNP (last  3 results) No results for input(s): "PROBNP" in the last 8760 hours. HbA1C: Recent Labs    11/05/22 1953  HGBA1C 11.7*   CBG: Recent Labs  Lab 11/06/22 0733 11/06/22 1226  GLUCAP 237* 176*   Lipid Profile: No results for input(s): "CHOL", "HDL", "LDLCALC", "TRIG", "CHOLHDL", "LDLDIRECT" in the last 72 hours. Thyroid Function Tests: Recent Labs    11/06/22 0223  FREET4 1.03   Anemia Panel: No results for input(s): "VITAMINB12", "FOLATE", "FERRITIN", "TIBC", "IRON", "RETICCTPCT" in the last 72 hours. Sepsis Labs: No results for input(s): "PROCALCITON", "LATICACIDVEN" in the last 168 hours.  No results found for this or any previous visit (from the past 240 hour(s)).       Radiology Studies: CT HEAD WO CONTRAST ( )  Result Date: 11/06/2022 CLINICAL DATA:  Blurry vision EXAM: CT HEAD WITHOUT CONTRAST TECHNIQUE: Contiguous axial images were obtained from the base of the skull through the vertex without intravenous contrast. RADIATION DOSE REDUCTION: This exam was performed according to the departmental dose-optimization program which includes automated exposure control, adjustment of the mA and/or kV according to patient size and/or use of iterative reconstruction technique. COMPARISON:  None Available. FINDINGS: Brain: There is no mass, hemorrhage or extra-axial collection. The size and configuration of the ventricles and extra-axial CSF spaces are normal. There is hypoattenuation of the white matter, most commonly indicating chronic small vessel disease. Age-indeterminate infarct within the left frontal lobe. Old ischemic insults within the right frontal white matter. Vascular: No abnormal hyperdensity of the major intracranial arteries or dural venous sinuses. No intracranial atherosclerosis. Skull: The visualized skull base, calvarium and extracranial soft tissues are normal. Sinuses/Orbits: No fluid levels or advanced mucosal thickening of the visualized paranasal sinuses. No  mastoid or middle ear effusion. The orbits are normal. IMPRESSION: 1. No acute intracranial hemorrhage. 2. Age-indeterminate infarct within the left frontal lobe. MRI could provide better temporal characterization. 3. Old ischemic insults within the right frontal white matter. Electronically Signed   By: Deatra Robinson M.D.   On: 11/06/2022 03:16   DG Chest 2 View  Result Date: 11/05/2022 CLINICAL DATA:  Chest pain EXAM: CHEST - 2 VIEW COMPARISON:  07/24/2022 FINDINGS: Cardiac shadow is enlarged. Scarring is again seen in the left mid lung. No focal infiltrate is noted. Mild central vascular congestion is seen. No bony abnormality is noted. IMPRESSION: Mild vascular congestion Electronically Signed   By: Alcide Clever M.D.   On: 11/05/2022 20:16        Scheduled Meds:  atorvastatin  20 mg Oral Daily   carvedilol  3.125 mg Oral BID WC   digoxin  0.125 mg Oral Daily   furosemide  40 mg Intravenous BID   heparin  5,000 Units Subcutaneous Q8H   insulin aspart  0-15 Units Subcutaneous TID WC   insulin glargine-yfgn  10 Units Subcutaneous QHS   multivitamin  with minerals  1 tablet Oral Daily   pantoprazole  40 mg Oral BID   Ensure Max Protein  11 oz Oral Daily   sodium chloride flush  3 mL Intravenous Q12H   Continuous Infusions:     LOS: 0 days    Time spent: 35 mins     Charise Killian, MD [facility] Pager [phone]  If 7PM-7AM, please contact night-coverage [email or web address]  11/06/2022, 2:02 PM

## 2022-11-06 NOTE — Progress Notes (Signed)
OT Screen Note  Patient Details Name: LYNITA BUETOW MRN: [id number] DOB: July 30, 1985   Cancelled Treatment:    Reason Eval/Treat Not Completed: OT screened, no needs identified, will sign off. OT order received. Chart reviewed. Per nsg/notes pt independent in room. No skilled OT needs identified. Will sign off at this time. Please re-consult if additional OT needs arise during this hospital stay.   Rockney Ghee, M.S., OTR/L 11/06/22, 9:07 AM

## 2022-11-06 NOTE — Assessment & Plan Note (Signed)
Hemoglobin of 11.3 today and has remained stable over the past month.  Will follow continue with IV PPI type and screen.

## 2022-11-06 NOTE — Assessment & Plan Note (Signed)
History of hypomagnesemia will replace and follow levels.

## 2022-11-06 NOTE — Assessment & Plan Note (Signed)
Repeated A1c previous 1 is over 10 reflecting poorly controlled diabetes mellitus type 2. Currently we will manage patient on glycemic protocol. Continue with Lantus home regimen. Glycemic protocol.

## 2022-11-06 NOTE — Assessment & Plan Note (Signed)
Lab Results  Component Value Date   CREATININE 1.24 (H) 11/05/2022   CREATININE 1.45 (H) 10/04/2022   CREATININE 1.52 (H) 08/04/2022  Creatinine trend is stable we will avoid contrast studies nephrotoxic agents meds and renally dose.  We will currently also hold Aldactone and Entresto as patient is in need of diuretic therapy to allow room for diuresis for CHF exacerbation and resume once patient is euvolemic.

## 2022-11-06 NOTE — Progress Notes (Signed)
Initial Nutrition Assessment  DOCUMENTATION CODES:   Obesity unspecified  INTERVENTION:   -Ensure MAX Protein po daily, each supplement provides 150 kcal and 30 grams of protein   -Multivitamin with minerals daily  -Placed "Low Sodium Nutrition Therapy" handout in AVS  NUTRITION DIAGNOSIS:   Increased nutrient needs related to chronic illness (CHF) as evidenced by estimated needs.  GOAL:   Patient will meet greater than or equal to 90% of their needs  MONITOR:   PO intake, Supplement acceptance, Labs, Weight trends, I & O's  REASON FOR ASSESSMENT:   Consult Other (Comment) (nutrition goals)  ASSESSMENT:   37 y.o. female with medical history significant for reduced ejection fraction heart failure, hypertension, diabetes mellitus, CKD, obesity and history of polysubstance abuse coming to [place] with complaints of swelling and fluid retention in arms and legs along with some central chest tightness and shortness of breath and all has been going on for about a week.  Suspect given pt's substance abuse, pt with poor quality diet. Will order Ensure Max daily for additional protein and daily MVI given increased needs.   Per weight records, pt's weight has increased.  Lowest weight noted: 201 lbs on 4/3.  Highest weight is this admission: 286 lbs 7/6.   Medications: Lasix, IV Mg sulfate  Labs reviewed: CBGs: 237 Low Mg  UDS+ cocaine  NUTRITION - FOCUSED PHYSICAL EXAM:  Unable to complete, RD working remotely.   Diet Order:   Diet Order             Diet heart healthy/carb modified Room service appropriate? Yes; Fluid consistency: Thin; Fluid restriction: 1500 mL Fluid  Diet effective now                   EDUCATION NEEDS:   Education needs have been addressed  Skin:  Skin Assessment: Reviewed RN Assessment  Last BM:  7/5  Height:   Ht Readings from Last 1 Encounters:  11/06/22 5\' 7"  (1.702 m)    Weight:   Wt Readings from Last 1 Encounters:   11/06/22 130 kg    BMI:  Body mass index is 44.89 kg/m.  Estimated Nutritional Needs:   Kcal:  1800-2000  Protein:  90-100g  Fluid:  Per MD  Tilda Franco, MS, RD, LDN Inpatient Clinical Dietitian Contact information available via Amion

## 2022-11-06 NOTE — Assessment & Plan Note (Signed)
Swelling in both her arms and legs consistent with her anasarca picture and CHF. Continue with diuretic therapy and electrolyte management along with monitoring kidney functions.

## 2022-11-06 NOTE — Progress Notes (Signed)
PHARMACIST - PHYSICIAN COMMUNICATION  CONCERNING: IV to Oral Route Change Policy  RECOMMENDATION: This patient is receiving pantoprazole by the intravenous route.  Based on criteria approved by the Pharmacy and Therapeutics Committee, the intravenous medication(s) is/are being converted to the equivalent oral dose form(s).  DESCRIPTION: These criteria include: The patient is eating (either orally or via tube) and/or has been taking other orally administered medications for a least 24 hours The patient has no evidence of active gastrointestinal bleeding or impaired GI absorption (gastrectomy, short bowel, patient on TNA or NPO).  If you have questions about this conversion, please contact the Pharmacy Department   Tressie Ellis, [facility] 11/06/2022 11:46 AM

## 2022-11-06 NOTE — Progress Notes (Signed)
PT Cancellation Note  Patient Details Name: Molly STEHL MRN: [id number] DOB: 01-01-1986   Cancelled Treatment:    Reason Eval/Treat Not Completed: Other (comment). Consult received and chart reviewed. Per documentation, pt indep with mobility efforts, listed as level 6. Discussed with care team, no PT needs at this time. Will sign off. Please re-order if needs change.   Silvio Sausedo 11/06/2022, 8:52 AM Molly Howard, PT, DPT, [facility] [phone]

## 2022-11-07 ENCOUNTER — Inpatient Hospital Stay: Payer: Medicaid Other

## 2022-11-07 DIAGNOSIS — I5043 Acute on chronic combined systolic (congestive) and diastolic (congestive) heart failure: Secondary | ICD-10-CM | POA: Diagnosis not present

## 2022-11-07 LAB — BASIC METABOLIC PANEL
Anion gap: 8 (ref 5–15)
BUN: 26 mg/dL — ABNORMAL HIGH (ref 6–20)
CO2: 27 mmol/L (ref 22–32)
Calcium: 6.5 mg/dL — ABNORMAL LOW (ref 8.9–10.3)
Chloride: 99 mmol/L (ref 98–111)
Creatinine, Ser: 1.16 mg/dL — ABNORMAL HIGH (ref 0.44–1.00)
GFR, Estimated: >60 mL/min (ref >60)
Glucose, Bld: 188 mg/dL — ABNORMAL HIGH (ref 70–99)
Potassium: 3.8 mmol/L (ref 3.5–5.1)
Sodium: 134 mmol/L — ABNORMAL LOW (ref 135–145)

## 2022-11-07 LAB — CBC
HCT: 34.1 % — ABNORMAL LOW (ref 36.0–46.0)
Hemoglobin: 10.2 g/dL — ABNORMAL LOW (ref 12.0–15.0)
MCH: 26.6 pg (ref 26.0–34.0)
MCHC: 29.9 g/dL — ABNORMAL LOW (ref 30.0–36.0)
MCV: 89 fL (ref 80.0–100.0)
Platelets: 304 10*3/uL (ref 150–400)
RBC: 3.83 MIL/uL — ABNORMAL LOW (ref 3.87–5.11)
RDW: 16 % — ABNORMAL HIGH (ref 11.5–15.5)
WBC: 4.4 10*3/uL (ref 4.0–10.5)
nRBC: 0 % (ref 0.0–0.2)

## 2022-11-07 LAB — GLUCOSE, CAPILLARY
Glucose-Capillary: 172 mg/dL — ABNORMAL HIGH (ref 70–99)
Glucose-Capillary: 208 mg/dL — ABNORMAL HIGH (ref 70–99)
Glucose-Capillary: 149 mg/dL — ABNORMAL HIGH (ref 70–99)
Glucose-Capillary: 119 mg/dL — ABNORMAL HIGH (ref 70–99)
Glucose-Capillary: 183 mg/dL — ABNORMAL HIGH (ref 70–99)

## 2022-11-07 LAB — MAGNESIUM: Magnesium: 1.7 mg/dL (ref 1.7–2.4)

## 2022-11-07 MED ORDER — RIVAROXABAN 10 MG PO TABS
10.0000 mg | ORAL_TABLET | Freq: Every day | ORAL | Status: DC
  Administered 2022-11-07 – 2022-11-15 (×9): 10 mg via ORAL
  Filled 2022-11-07 (×9): qty 1

## 2022-11-07 MED ORDER — MORPHINE SULFATE (PF) 2 MG/ML IV SOLN: 1.0000 mg | INTRAVENOUS | Status: DC | PRN

## 2022-11-07 MED ORDER — OXYCODONE-ACETAMINOPHEN 5-325 MG PO TABS
1.0000 | ORAL_TABLET | Freq: Four times a day (QID) | ORAL | Status: DC | PRN
  Administered 2022-11-07: 1 via ORAL
  Filled 2022-11-07: qty 1

## 2022-11-07 NOTE — Progress Notes (Signed)
PROGRESS NOTE   HPI was taken from Dr. Irena Cords: Molly Howard is a 37 y.o. female with medical history significant for reduced ejection fraction heart failure, hypertension, diabetes mellitus, CKD, obesity and history of polysubstance abuse coming to [place] with complaints of swelling and fluid retention in arms and legs along with some central chest tightness and shortness of breath and all has been going on for about a week.  Patient states the chest pain is off and on it is really a chest tightness that is worse only when she is sleeping and it resolves without any medications.  She has not tried any intervention.  Patient has not been able to fill her medications .Chart review shows primary care note from [facility] patient's noncompliance with cardiology and heart failure clinic patient has had a death in her family and her mom has passed away not kept any of her appointments.  Patient also has past medical history of anemia secondary to menorrhagia is being currently followed by OB/GYN and an ultrasound that showed an endometrial polyp.  Patient's been prescribed oral contraceptives that she has not been able to purchase.  Patient's most recent echocardiogram done earlier this year in March showed an EF of 39%.    Molly Howard  ZOX:[id number] DOB: 1986/04/19 DOA: 11/05/2022 PCP: Margarita Mail, DO   Assessment & Plan:   Principal Problem:   Acute on chronic combined systolic and diastolic CHF (congestive heart failure) (HCC) Active Problems:   Chronic anemia   Essential hypertension   Diabetes mellitus, type II (HCC)   Chronic kidney disease (CKD), stage II (mild)   GERD without esophagitis   Swelling   Tobacco use   Hypomagnesemia   Cocaine abuse (HCC)   Bilateral leg edema  Assessment and Plan: Acute on chronic combined CHF exacerbation: continue on IV lasix. Monitor I/Os and daily weights. Continue on home dose of coreg. Holding home torsemide, entresto & aldactone as Cr is labile.  Has not f/u outpatient w/ cardio in at least 1 year secondary to not having transportation as per pt.   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 H&H are stable   HTN: continue on home dose of coreg. Holding aldactone, entresto secondary to labile Cr   DM2: poorly controlled, HbA1c 11.7. Continue on glargine, SSI w/ accuchecks   CKDII: Cr is trending up today. Avoid nephrotoxic meds   Cocaine abuse: urine drug screen was positive for cocaine. Cocaine use cessation counseling  Hypomagnesemia: WNL today   Smoker: nicotine patch to prevent w/drawal. Smoking cessation counseling x 5 mins  Morbid obesity: BMI 44.8. Complicates overall care & prognosis   Left arm & shoulder pain: [place] of LUE was neg for DVT. Recent fall prior to hospitalization. XR left shoulder ordered. Percocet, morphine prn for pain    DVT prophylaxis: heparin SQ but refuses this. Xarelto for DVT prophylaxis inpatient only  Code Status: full  Family Communication: Disposition Plan: likely d/c back home   Level of care: Telemetry Cardiac Status is: Inpatient Remains inpatient appropriate because: requiring IV lasix     Consultants:    Procedures:   Antimicrobials:    Subjective: Pt c/o shortness of breath   Objective: Vitals:   11/07/22 0746 11/07/22 0830 11/07/22 0933 11/07/22 1218  BP: (!) 140/98   (!) 139/92  Pulse: 88   87  Resp: 20   20  Temp: 98.6 F (37 C)   98.1 F (36.7 C)  TempSrc:      SpO2:  92%   98%  Weight:  127.8 kg 130.6 kg   Height:        Intake/Output Summary (Last 24 hours) at 11/07/2022 1357 Last data filed at 11/07/2022 1037 Gross per 24 hour  Intake 723 ml  Output --  Net 723 ml   Filed Weights   11/06/22 0208 11/07/22 0830 11/07/22 0933  Weight: 130 kg 127.8 kg 130.6 kg    Examination:  General exam: Appears calm and comfortable  Respiratory system: Clear to auscultation. Respiratory effort normal. Cardiovascular system: S1 & S2+. No  rubs, gallops or clicks. 1+  pitting edema of b/l LE Gastrointestinal system: Abdomen is nondistended, soft and nontender.  Normal bowel sounds heard. Central nervous system: Alert and oriented. Moves all extremities  Psychiatry: Judgement and insight appear normal. Mood & affect appropriate.     Data Reviewed: I have personally reviewed following labs and imaging studies  CBC: Recent Labs  Lab 11/05/22 1953 11/06/22 0223 11/07/22 0440  WBC 4.8 4.7 4.4  NEUTROABS  --  3.3  --   HGB 11.3* 10.6* 10.2*  HCT 38.8 36.5 34.1*  MCV 91.7 90.1 89.0  PLT 333 336 304   Basic Metabolic Panel: Recent Labs  Lab 11/05/22 1953 11/06/22 0223 11/07/22 0440  NA 137 135 134*  K 4.5 4.1 3.8  CL 98 99 99  CO2 30 26 27   GLUCOSE 229* 262* 188*  BUN 23* 23* 26*  CREATININE 1.24* 1.11* 1.16*  CALCIUM 6.6* 6.5* 6.5*  MG  --  1.4* 1.7   GFR: Estimated Creatinine Clearance: 92.6 mL/min (A) (by C-G formula based on SCr of 1.16 mg/dL (H)). Liver Function Tests: Recent Labs  Lab 11/05/22 2159 11/06/22 0223  AST 20 18  ALT 13 11  ALKPHOS 164* 167*  BILITOT 0.6 0.7  PROT 6.1* 6.0*  ALBUMIN 1.7* 1.8*   No results for input(s): "LIPASE", "AMYLASE" in the last 168 hours. No results for input(s): "AMMONIA" in the last 168 hours. Coagulation Profile: No results for input(s): "INR", "PROTIME" in the last 168 hours. Cardiac Enzymes: No results for input(s): "CKTOTAL", "CKMB", "CKMBINDEX", "TROPONINI" in the last 168 hours. BNP (last 3 results) No results for input(s): "PROBNP" in the last 8760 hours. HbA1C: Recent Labs    11/05/22 1953  HGBA1C 11.7*   CBG: Recent Labs  Lab 11/06/22 1226 11/06/22 1601 11/06/22 2117 11/07/22 0747 11/07/22 1219  GLUCAP 176* 186* 259* 172* 208*   Lipid Profile: No results for input(s): "CHOL", "HDL", "LDLCALC", "TRIG", "CHOLHDL", "LDLDIRECT" in the last 72 hours. Thyroid Function Tests: Recent Labs    11/06/22 0223  FREET4 1.03   Anemia Panel: No results for input(s):  "VITAMINB12", "FOLATE", "FERRITIN", "TIBC", "IRON", "RETICCTPCT" in the last 72 hours. Sepsis Labs: No results for input(s): "PROCALCITON", "LATICACIDVEN" in the last 168 hours.  No results found for this or any previous visit (from the past 240 hour(s)).       Radiology Studies: US Venous Img Upper Uni Left (DVT)  Result Date: 11/06/2022 CLINICAL DATA:  Left arm swelling EXAM: LEFT UPPER EXTREMITY VENOUS DOPPLER ULTRASOUND TECHNIQUE: Gray-scale sonography with graded compression, as well as color Doppler and duplex ultrasound were performed to evaluate the upper extremity deep venous system from the level of the subclavian vein and including the jugular, axillary, basilic, radial, ulnar and upper cephalic vein. Spectral Doppler was utilized to evaluate flow at rest and with distal augmentation maneuvers. COMPARISON:  None Available. FINDINGS: Contralateral Subclavian Vein: Respiratory phasicity is normal and  symmetric with the symptomatic side. No evidence of thrombus. Normal compressibility. Internal Jugular Vein: No evidence of thrombus. Normal compressibility, respiratory phasicity and response to augmentation. Subclavian Vein: No evidence of thrombus. Normal compressibility, respiratory phasicity and response to augmentation. Axillary Vein: No evidence of thrombus. Normal compressibility, respiratory phasicity and response to augmentation. Cephalic Vein: No evidence of thrombus. Normal compressibility, respiratory phasicity and response to augmentation. Basilic Vein: No evidence of thrombus. Normal compressibility, respiratory phasicity and response to augmentation. Brachial Veins: No evidence of thrombus. Normal compressibility, respiratory phasicity and response to augmentation. Radial Veins: No evidence of thrombus. Normal compressibility, respiratory phasicity and response to augmentation. Ulnar Veins: No evidence of thrombus. Normal compressibility, respiratory phasicity and response to  augmentation. Venous Reflux:  None visualized. Other Findings:  None visualized. IMPRESSION: No evidence of DVT within the left upper extremity. Electronically Signed   By: Charlett Nose M.D.   On: 11/06/2022 22:17   CT HEAD WO CONTRAST ( )  Result Date: 11/06/2022 CLINICAL DATA:  Blurry vision EXAM: CT HEAD WITHOUT CONTRAST TECHNIQUE: Contiguous axial images were obtained from the base of the skull through the vertex without intravenous contrast. RADIATION DOSE REDUCTION: This exam was performed according to the departmental dose-optimization program which includes automated exposure control, adjustment of the mA and/or kV according to patient size and/or use of iterative reconstruction technique. COMPARISON:  None Available. FINDINGS: Brain: There is no mass, hemorrhage or extra-axial collection. The size and configuration of the ventricles and extra-axial CSF spaces are normal. There is hypoattenuation of the white matter, most commonly indicating chronic small vessel disease. Age-indeterminate infarct within the left frontal lobe. Old ischemic insults within the right frontal white matter. Vascular: No abnormal hyperdensity of the major intracranial arteries or dural venous sinuses. No intracranial atherosclerosis. Skull: The visualized skull base, calvarium and extracranial soft tissues are normal. Sinuses/Orbits: No fluid levels or advanced mucosal thickening of the visualized paranasal sinuses. No mastoid or middle ear effusion. The orbits are normal. IMPRESSION: 1. No acute intracranial hemorrhage. 2. Age-indeterminate infarct within the left frontal lobe. MRI could provide better temporal characterization. 3. Old ischemic insults within the right frontal white matter. Electronically Signed   By: Deatra Robinson M.D.   On: 11/06/2022 03:16   DG Chest 2 View  Result Date: 11/05/2022 CLINICAL DATA:  Chest pain EXAM: CHEST - 2 VIEW COMPARISON:  07/24/2022 FINDINGS: Cardiac shadow is enlarged. Scarring is  again seen in the left mid lung. No focal infiltrate is noted. Mild central vascular congestion is seen. No bony abnormality is noted. IMPRESSION: Mild vascular congestion Electronically Signed   By: Alcide Clever M.D.   On: 11/05/2022 20:16        Scheduled Meds:  atorvastatin  20 mg Oral Daily   carvedilol  3.125 mg Oral BID WC   digoxin  0.125 mg Oral Daily   furosemide  40 mg Intravenous BID   insulin aspart  0-15 Units Subcutaneous TID WC   insulin glargine-yfgn  10 Units Subcutaneous QHS   multivitamin with minerals  1 tablet Oral Daily   pantoprazole  40 mg Oral BID   Ensure Max Protein  11 oz Oral Daily   rivaroxaban  10 mg Oral Daily   sodium chloride flush  3 mL Intravenous Q12H   Continuous Infusions:     LOS: 1 day    Time spent: 35 mins     Charise Killian, MD [facility] Pager [phone]  If 7PM-7AM, please contact night-coverage [email or web address]  11/07/2022, 1:57 PM

## 2022-11-08 ENCOUNTER — Other Ambulatory Visit: Payer: Self-pay

## 2022-11-08 DIAGNOSIS — E1169 Type 2 diabetes mellitus with other specified complication: Secondary | ICD-10-CM

## 2022-11-08 DIAGNOSIS — I361 Nonrheumatic tricuspid (valve) insufficiency: Secondary | ICD-10-CM

## 2022-11-08 DIAGNOSIS — I5023 Acute on chronic systolic (congestive) heart failure: Secondary | ICD-10-CM

## 2022-11-08 DIAGNOSIS — F1410 Cocaine abuse, uncomplicated: Secondary | ICD-10-CM

## 2022-11-08 LAB — IRON AND TIBC
Iron: 39 ug/dL (ref 28–170)
Saturation Ratios: 13 % (ref 10.4–31.8)
TIBC: 308 ug/dL (ref 250–450)
UIBC: 269 ug/dL

## 2022-11-08 LAB — CBC
HCT: 33.8 % — ABNORMAL LOW (ref 36.0–46.0)
Hemoglobin: 10.2 g/dL — ABNORMAL LOW (ref 12.0–15.0)
MCH: 26.8 pg (ref 26.0–34.0)
MCHC: 30.2 g/dL (ref 30.0–36.0)
MCV: 88.9 fL (ref 80.0–100.0)
Platelets: 313 10*3/uL (ref 150–400)
RBC: 3.8 MIL/uL — ABNORMAL LOW (ref 3.87–5.11)
RDW: 16.3 % — ABNORMAL HIGH (ref 11.5–15.5)
WBC: 4.6 10*3/uL (ref 4.0–10.5)
nRBC: 0 % (ref 0.0–0.2)

## 2022-11-08 LAB — BASIC METABOLIC PANEL
Anion gap: 10 (ref 5–15)
BUN: 32 mg/dL — ABNORMAL HIGH (ref 6–20)
CO2: 27 mmol/L (ref 22–32)
Calcium: 6.8 mg/dL — ABNORMAL LOW (ref 8.9–10.3)
Chloride: 98 mmol/L (ref 98–111)
Creatinine, Ser: 1.25 mg/dL — ABNORMAL HIGH (ref 0.44–1.00)
GFR, Estimated: 57 mL/min — ABNORMAL LOW (ref >60)
Glucose, Bld: 184 mg/dL — ABNORMAL HIGH (ref 70–99)
Potassium: 4.1 mmol/L (ref 3.5–5.1)
Sodium: 135 mmol/L (ref 135–145)

## 2022-11-08 LAB — GLUCOSE, CAPILLARY
Glucose-Capillary: 267 mg/dL — ABNORMAL HIGH (ref 70–99)
Glucose-Capillary: 135 mg/dL — ABNORMAL HIGH (ref 70–99)
Glucose-Capillary: 181 mg/dL — ABNORMAL HIGH (ref 70–99)
Glucose-Capillary: 214 mg/dL — ABNORMAL HIGH (ref 70–99)

## 2022-11-08 LAB — LACTIC ACID, PLASMA
Lactic Acid, Venous: 2 mmol/L (ref 0.5–1.9)
Lactic Acid, Venous: 1.3 mmol/L (ref 0.5–1.9)

## 2022-11-08 LAB — FERRITIN: Ferritin: 13 ng/mL (ref 11–307)

## 2022-11-08 LAB — MAGNESIUM: Magnesium: 1.7 mg/dL (ref 1.7–2.4)

## 2022-11-08 LAB — MRSA NEXT GEN BY PCR, NASAL: MRSA by PCR Next Gen: NOT DETECTED

## 2022-11-08 MED ORDER — MAGNESIUM OXIDE -MG SUPPLEMENT 400 (240 MG) MG PO TABS
800.0000 mg | ORAL_TABLET | Freq: Every day | ORAL | Status: DC
  Administered 2022-11-08 – 2022-11-14 (×7): 800 mg via ORAL
  Filled 2022-11-08 (×7): qty 2

## 2022-11-08 MED ORDER — MILRINONE LACTATE IN DEXTROSE 20-5 MG/100ML-% IV SOLN
0.2500 ug/kg/min | INTRAVENOUS | Status: DC
  Administered 2022-11-08 – 2022-11-14 (×12): 0.25 ug/kg/min via INTRAVENOUS
  Filled 2022-11-08 (×13): qty 100

## 2022-11-08 MED ORDER — FUROSEMIDE 10 MG/ML IJ SOLN
80.0000 mg | Freq: Once | INTRAMUSCULAR | Status: AC
  Administered 2022-11-08: 80 mg via INTRAVENOUS
  Filled 2022-11-08: qty 8

## 2022-11-08 MED ORDER — FUROSEMIDE 10 MG/ML IJ SOLN
12.0000 mg/h | INTRAVENOUS | Status: DC
  Administered 2022-11-08 – 2022-11-12 (×7): 12 mg/h via INTRAVENOUS
  Filled 2022-11-08 (×7): qty 20

## 2022-11-08 MED ORDER — SACUBITRIL-VALSARTAN 24-26 MG PO TABS
1.0000 | ORAL_TABLET | Freq: Two times a day (BID) | ORAL | Status: DC
  Administered 2022-11-08 – 2022-11-11 (×7): 1 via ORAL
  Filled 2022-11-08 (×8): qty 1

## 2022-11-08 MED ORDER — CHLORHEXIDINE GLUCONATE CLOTH 2 % EX PADS
6.0000 | MEDICATED_PAD | Freq: Every day | CUTANEOUS | Status: DC
  Administered 2022-11-08 – 2022-11-15 (×8): 6 via TOPICAL

## 2022-11-08 NOTE — Inpatient Diabetes Management (Addendum)
Inpatient Diabetes Program Recommendations  AACE/ADA: New Consensus Statement on Inpatient Glycemic Control (2015)  Target Ranges:  Prepandial:   less than 140 mg/dL      Peak postprandial:   less than 180 mg/dL (1-2 hours)      Critically ill patients:  140 - 180 mg/dL    Latest Reference Range & Units 11/05/22 21:59  Cocaine Metabolite,Ur [place] NONE DETECTED  POSITIVE !  !: Data is abnormal  Latest Reference Range & Units 08/03/22 12:29 11/05/22 19:53  Hemoglobin A1C 4.8 - 5.6 % 10.1 (H) 11.7 (H)  289 mg/dl  (H): Data is abnormally high  Latest Reference Range & Units 11/07/22 07:47 11/07/22 12:19 11/07/22 15:36 11/07/22 17:03 11/07/22 21:51  Glucose-Capillary 70 - 99 mg/dL 161 (H)  3 units Novolog  208 (H)  5 units Novolog  149 (H) 119 (H) 183 (H)  10 units Semglee  (H): Data is abnormally high  Latest Reference Range & Units 11/08/22 08:29  Glucose-Capillary 70 - 99 mg/dL 096 (H)  (H): Data is abnormally high   Admit with: Acute on chronic combined CHF exacerbation   History: DM, CKD, Polysubstance Abuse  Home DM Meds: Farxiga 10 mg Daily        Lantus 10 units AM/ 15 units PM  Current Orders: Semglee 10 units at bedtime     Novolog Moderate Correction Scale/ SSI (0-15 units) TID AC    CBGs are stable at present  A1c shows very poor CBG control at home--Needs follow up with PCP--Pt will likely not be able to manage her diabetes well if she continues to use illegal drugs    Addendum 12:10pm--Met w/ pt at bedside.  Pt verified home DM med regimen: Farxiga 10 mg daily + Lantus 10 units AM/ 15 units PM.  Stated to me she only misses doses of Insulin when she runs out.  Currently has Insulin at home but states she needs more Insulin Pen Needles.  Has CBG meter at home but only checking CBGs QAM and states she does forget to check CBGs sometimes.  Spoke with patient about her current A1c of 11.7%.  Explained what an A1c is and what it measures.  Reminded patient that her  goal A1c is 7% or less per ADA standards to prevent both acute and long-term complications.  Explained to patient the extreme importance of good glucose control at home.  Encouraged patient to check her CBGs at least bid at home (fasting and another check within the day) and to record all CBGs in a logbook for her PCP to review.  Also reviewed goal CBGs for home.      --Will follow patient during hospitalization--  Ambrose Finland RN, MSN, CDCES Diabetes Coordinator Inpatient Glycemic Control Team Team Pager: [phone] (8a-5p)

## 2022-11-08 NOTE — TOC Initial Note (Signed)
Transition of Care [facility]) - Initial/Assessment Note    Patient Details  Name: Molly Howard MRN: [id number] Date of Birth: [birth year]  Transition of Care [facility]) CM/SW Contact:    Margarito Liner, LCSW Phone Number: 11/08/2022, 3:25 PM  Clinical Narrative: Readmission prevention screen complete. CSW met with patient. No supports at bedside. CSW introduced role and explained that discharge planning would be discussed. PCP is Margarita Mail, DO. She uses Medicaid Transportation to get to appointments. [facility]acy is [facility]. Patient reports issues affording copays. CSW gave her information on Medicaid copays in [place]. Patient lives home with her sister. No home health prior to admission. She uses a single point cane when needed to get around at home. No further concerns. CSW encouraged patient to contact CSW as needed. CSW will continue to follow patient for support and facilitate return home once stable. Her sister will transport her home at discharge.                 Expected Discharge Plan: Home/Self Care Barriers to Discharge: Continued Medical Work up   Patient Goals and CMS Choice            Expected Discharge Plan and Services     Post Acute Care Choice: NA Living arrangements for the past 2 months: Single Family Home                                      Prior Living Arrangements/Services Living arrangements for the past 2 months: Single Family Home Lives with:: Siblings Patient language and need for interpreter reviewed:: Yes Do you feel safe going back to the place where you live?: Yes      Need for Family Participation in Patient Care: Yes (Comment) Care giver support system in place?: Yes (comment) Current home services: DME Criminal Activity/Legal Involvement Pertinent to Current Situation/Hospitalization: No - Comment as needed  Activities of Daily Living Home Assistive Devices/Equipment: Cane (specify quad or straight) ADL Screening (condition at time of  admission) Patient's cognitive ability adequate to safely complete daily activities?: Yes Is the patient deaf or have difficulty hearing?: No Does the patient have difficulty seeing, even when wearing glasses/contacts?: No Does the patient have difficulty concentrating, remembering, or making decisions?: No Patient able to express need for assistance with ADLs?: No Does the patient have difficulty dressing or bathing?: No Independently performs ADLs?: Yes (appropriate for developmental age) Does the patient have difficulty walking or climbing stairs?: Yes Weakness of Legs: None Weakness of Arms/Hands: None  Permission Sought/Granted                  Emotional Assessment Appearance:: Appears stated age Attitude/Demeanor/Rapport: Engaged, Gracious Affect (typically observed): Accepting, Appropriate, Calm, Pleasant Orientation: : Oriented to Self, Oriented to Place, Oriented to  Time, Oriented to Situation Alcohol / Substance Use: Not Applicable Psych Involvement: No (comment)  Admission diagnosis:  Bilateral leg edema [R60.0] Acute on chronic congestive heart failure, unspecified heart failure type [facility]) [I50.9] Patient Active Problem List   Diagnosis Date Noted   Bilateral leg edema 11/06/2022   NICM (nonischemic cardiomyopathy) (HCC) 07/31/2022   Hypocalcemia 07/25/2022   Leg edema 07/25/2022   Acute on chronic combined systolic and diastolic CHF (congestive heart failure) (HCC) 07/25/2022   GERD without esophagitis 07/24/2022   Cocaine abuse (HCC) 02/12/2022   Acute pulmonary edema (HCC)    Syncope 02/09/2022   Menorrhagia with  regular cycle 06/22/2021   Dyslipidemia 11/27/2020   Chronic kidney disease (CKD), stage II (mild) 11/27/2020   Hypomagnesemia 10/17/2020   Dyspnea 05/21/2020   Essential hypertension 05/20/2020   Diabetes mellitus, type II (HCC) 05/20/2020   Chronic anemia 05/20/2020   Reactive thrombocytosis 05/20/2020   Obesity (BMI 30-39.9) 05/20/2020    Chronic combined systolic and diastolic heart failure (HCC) 11/01/2019   Financial difficulties 11/01/2019   Medication management 11/01/2019   Lung nodule 11/01/2019   Nonischemic cardiomyopathy (HCC) 11/01/2019   Polysubstance abuse (HCC) 11/01/2019   Tobacco use 11/01/2019   Swelling    PCP:  Margarita Mail, DO Pharmacy:   [facility] - [place], [place] - [address]enee [place]er [place] [address] Phone: [phone] Fax: [phone]     Social Determinants of Health (SDOH) Social History: SDOH Screenings   Food Insecurity: No Food Insecurity (11/06/2022)  Housing: Low Risk  (11/06/2022)  Transportation Needs: No Transportation Needs (11/06/2022)  Utilities: Not At Risk (11/06/2022)  Depression (PHQ2-9): Low Risk  (10/04/2022)  Financial Resource Strain: High Risk (06/16/2020)  Tobacco Use: Medium Risk (11/05/2022)   SDOH Interventions:     Readmission Risk Interventions    11/08/2022    3:23 PM 07/25/2022   10:35 AM 02/15/2022    2:34 PM  Readmission Risk Prevention Plan  Transportation Screening Complete Complete Complete  PCP or Specialist Appt within 3-5 Days  Complete Complete  Social Work Consult for Recovery Care Planning/Counseling  Complete Complete  Palliative Care Screening  Not Applicable Not Applicable  Medication Review Oceanographer) Complete Complete Complete  PCP or Specialist appointment within 3-5 days of discharge Complete    SW Recovery Care/Counseling Consult Complete    Palliative Care Screening Not Applicable    Skilled Nursing Facility Not Applicable

## 2022-11-08 NOTE — Progress Notes (Signed)
PROGRESS NOTE   HPI was taken from Dr. Irena Cords: Molly Howard is a 37 y.o. female with medical history significant for reduced ejection fraction heart failure, hypertension, diabetes mellitus, CKD, obesity and history of polysubstance abuse coming to [place] with complaints of swelling and fluid retention in arms and legs along with some central chest tightness and shortness of breath and all has been going on for about a week.  Patient states the chest pain is off and on it is really a chest tightness that is worse only when she is sleeping and it resolves without any medications.  She has not tried any intervention.  Patient has not been able to fill her medications .Chart review shows primary care note from [facility] patient's noncompliance with cardiology and heart failure clinic patient has had a death in her family and her mom has passed away not kept any of her appointments.  Patient also has past medical history of anemia secondary to menorrhagia is being currently followed by OB/GYN and an ultrasound that showed an endometrial polyp.  Patient's been prescribed oral contraceptives that she has not been able to purchase.  Patient's most recent echocardiogram done earlier this year in March showed an EF of 39%.    SLOAN SNAWDER  ZOX:[id number] DOB: January 29, 1986 DOA: 11/05/2022 PCP: Margarita Mail, DO   Assessment & Plan:   Principal Problem:   Acute on chronic combined systolic and diastolic CHF (congestive heart failure) (HCC) Active Problems:   Chronic anemia   Essential hypertension   Diabetes mellitus, type II (HCC)   Chronic kidney disease (CKD), stage II (mild)   GERD without esophagitis   Swelling   Tobacco use   Hypomagnesemia   Cocaine abuse (HCC)   Bilateral leg edema  Assessment and Plan: Acute on chronic combined CHF exacerbation: reports dry weight of around 230 pounds. Today 293. Edema to abdomen. Has not diuresed well here.  Has not f/u outpatient w/ cardio in at least 1 year  secondary to not having transportation as per pt. 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 H&H are stable   HTN: continue on home dose of coreg. Holding aldactone, entresto secondary to diuresis  DM2: poorly controlled, HbA1c 11.7. Continue on glargine, SSI w/ accuchecks   CKDII: Cr today is stable at 1.25  Cocaine abuse: urine drug screen was positive for cocaine. Cocaine use cessation counseling  Hypomagnesemia: 1.7 today will start oral mg  Smoker: nicotine patch to prevent w/drawal. Smoking cessation counseling x 5 mins  Morbid obesity: BMI 44.8. Complicates overall care & prognosis   Left arm swelling: pvl negative for dvt. X-ray nothing acute. Possibly 2/2 positioning and edematous state. Will discuss w/ cardiology   DVT prophylaxis: heparin SQ but refuses this. Xarelto for DVT prophylaxis inpatient only  Code Status: full  Family Communication: none @ bedside Disposition Plan: likely d/c back home   Level of care: Telemetry Cardiac Status is: Inpatient Remains inpatient appropriate because: requiring IV diuresis    Consultants:    Procedures:   Antimicrobials:    Subjective: Mild sob, says edema not improved  Objective: Vitals:   11/07/22 1932 11/08/22 0340 11/08/22 0500 11/08/22 0833  BP: (!) 130/90 (!) 138/98  133/89  Pulse: 93 82  80  Resp: 18 14  17   Temp: 98.3 F (36.8 C) 97.7 F (36.5 C)  97.7 F (36.5 C)  TempSrc: Oral Oral  Oral  SpO2: 98% 98%  97%  Weight:  133.1 kg   Height:        Intake/Output Summary (Last 24 hours) at 11/08/2022 1118 Last data filed at 11/08/2022 1022 Gross per 24 hour  Intake 720 ml  Output 350 ml  Net 370 ml   Filed Weights   11/07/22 0830 11/07/22 0933 11/08/22 0500  Weight: 127.8 kg 130.6 kg 133.1 kg    Examination:  General exam: Appears calm and comfortable  Respiratory system: Clear to auscultation. Respiratory effort normal. Cardiovascular system: distant heart  sounds Gastrointestinal system: edema of abdomen Extremities: pitting edema b/l legs and left arm Central nervous system: Alert and oriented. Moves all extremities  Psychiatry: Judgement and insight appear normal. Mood & affect appropriate.     Data Reviewed: I have personally reviewed following labs and imaging studies  CBC: Recent Labs  Lab 11/05/22 1953 11/06/22 0223 11/07/22 0440 11/08/22 0420  WBC 4.8 4.7 4.4 4.6  NEUTROABS  --  3.3  --   --   HGB 11.3* 10.6* 10.2* 10.2*  HCT 38.8 36.5 34.1* 33.8*  MCV 91.7 90.1 89.0 88.9  PLT 333 336 304 313   Basic Metabolic Panel: Recent Labs  Lab 11/05/22 1953 11/06/22 0223 11/07/22 0440 11/08/22 0420  NA 137 135 134* 135  K 4.5 4.1 3.8 4.1  CL 98 99 99 98  CO2 30 26 27 27   GLUCOSE 229* 262* 188* 184*  BUN 23* 23* 26* 32*  CREATININE 1.24* 1.11* 1.16* 1.25*  CALCIUM 6.6* 6.5* 6.5* 6.8*  MG  --  1.4* 1.7 1.7   GFR: Estimated Creatinine Clearance: 86.9 mL/min (A) (by C-G formula based on SCr of 1.25 mg/dL (H)). Liver Function Tests: Recent Labs  Lab 11/05/22 2159 11/06/22 0223  AST 20 18  ALT 13 11  ALKPHOS 164* 167*  BILITOT 0.6 0.7  PROT 6.1* 6.0*  ALBUMIN 1.7* 1.8*   No results for input(s): "LIPASE", "AMYLASE" in the last 168 hours. No results for input(s): "AMMONIA" in the last 168 hours. Coagulation Profile: No results for input(s): "INR", "PROTIME" in the last 168 hours. Cardiac Enzymes: No results for input(s): "CKTOTAL", "CKMB", "CKMBINDEX", "TROPONINI" in the last 168 hours. BNP (last 3 results) No results for input(s): "PROBNP" in the last 8760 hours. HbA1C: Recent Labs    11/05/22 1953  HGBA1C 11.7*   CBG: Recent Labs  Lab 11/07/22 1219 11/07/22 1536 11/07/22 1703 11/07/22 2151 11/08/22 0829  GLUCAP 208* 149* 119* 183* 135*   Lipid Profile: No results for input(s): "CHOL", "HDL", "LDLCALC", "TRIG", "CHOLHDL", "LDLDIRECT" in the last 72 hours. Thyroid Function Tests: Recent Labs     11/06/22 0223  FREET4 1.03   Anemia Panel: No results for input(s): "VITAMINB12", "FOLATE", "FERRITIN", "TIBC", "IRON", "RETICCTPCT" in the last 72 hours. Sepsis Labs: No results for input(s): "PROCALCITON", "LATICACIDVEN" in the last 168 hours.  No results found for this or any previous visit (from the past 240 hour(s)).       Radiology Studies: DG Shoulder Left  Result Date: 11/07/2022 CLINICAL DATA:  Left shoulder pain.  Fall 3 months ago. EXAM: LEFT SHOULDER - 2+ VIEW COMPARISON:  Shoulder radiograph 08/08/2021 FINDINGS: No acute or evidence of prior fracture. Mild degenerative spurring of the glenohumeral joint. Chronic spurring about the acromion. No erosive or bony destructive change. There is mild generalized soft tissue edema. IMPRESSION: 1. Mild degenerative spurring of the glenohumeral joint. 2. Chronic spurring about the acromion. 3. Soft tissue edema. Electronically Signed   By: Narda Rutherford M.D.   On:  11/07/2022 15:21   US Venous Img Upper Uni Left (DVT)  Result Date: 11/06/2022 CLINICAL DATA:  Left arm swelling EXAM: LEFT UPPER EXTREMITY VENOUS DOPPLER ULTRASOUND TECHNIQUE: Gray-scale sonography with graded compression, as well as color Doppler and duplex ultrasound were performed to evaluate the upper extremity deep venous system from the level of the subclavian vein and including the jugular, axillary, basilic, radial, ulnar and upper cephalic vein. Spectral Doppler was utilized to evaluate flow at rest and with distal augmentation maneuvers. COMPARISON:  None Available. FINDINGS: Contralateral Subclavian Vein: Respiratory phasicity is normal and symmetric with the symptomatic side. No evidence of thrombus. Normal compressibility. Internal Jugular Vein: No evidence of thrombus. Normal compressibility, respiratory phasicity and response to augmentation. Subclavian Vein: No evidence of thrombus. Normal compressibility, respiratory phasicity and response to augmentation.  Axillary Vein: No evidence of thrombus. Normal compressibility, respiratory phasicity and response to augmentation. Cephalic Vein: No evidence of thrombus. Normal compressibility, respiratory phasicity and response to augmentation. Basilic Vein: No evidence of thrombus. Normal compressibility, respiratory phasicity and response to augmentation. Brachial Veins: No evidence of thrombus. Normal compressibility, respiratory phasicity and response to augmentation. Radial Veins: No evidence of thrombus. Normal compressibility, respiratory phasicity and response to augmentation. Ulnar Veins: No evidence of thrombus. Normal compressibility, respiratory phasicity and response to augmentation. Venous Reflux:  None visualized. Other Findings:  None visualized. IMPRESSION: No evidence of DVT within the left upper extremity. Electronically Signed   By: Charlett Nose M.D.   On: 11/06/2022 22:17        Scheduled Meds:  atorvastatin  20 mg Oral Daily   carvedilol  3.125 mg Oral BID WC   digoxin  0.125 mg Oral Daily   furosemide  40 mg Intravenous BID   insulin aspart  0-15 Units Subcutaneous TID WC   insulin glargine-yfgn  10 Units Subcutaneous QHS   multivitamin with minerals  1 tablet Oral Daily   pantoprazole  40 mg Oral BID   Ensure Max Protein  11 oz Oral Daily   rivaroxaban  10 mg Oral Daily   sodium chloride flush  3 mL Intravenous Q12H   Continuous Infusions:     LOS: 2 days    Time spent: 35 mins     Silvano Bilis, MD [facility] Pager [phone]  If 7PM-7AM, please contact night-coverage [email or web address]  11/08/2022, 11:18 AM

## 2022-11-08 NOTE — TOC CM/SW Note (Signed)
Went by room for readmission prevention screen. Patient had a visitor. Will try again later.  Charlynn Court, CSW [phone]

## 2022-11-08 NOTE — Progress Notes (Signed)
PICC order received. Spoke with primary RN that PICC will be placed tomorrow 7/9.

## 2022-11-08 NOTE — Progress Notes (Signed)
Nutrition Follow-up  DOCUMENTATION CODES:   Obesity unspecified  INTERVENTION:   -D/c Ensure Max -MVI with minerals daily -Educated pt on heart healthy/ carb modified diet and importance of self-management for both DM and CHF; provided "Heart Healthy, Consistent Carbohydrate Nutrition Therapy" and attached to AVS/ Discharge instructions  NUTRITION DIAGNOSIS:   Increased nutrient needs related to chronic illness (CHF) as evidenced by estimated needs.  Ongoing  GOAL:   Patient will meet greater than or equal to 90% of their needs  Progressing   MONITOR:   PO intake, Supplement acceptance, Labs, Weight trends, I & O's  REASON FOR ASSESSMENT:   Consult Other (Comment) (nutrition goals)  ASSESSMENT:   37 y.o. female with medical history significant for reduced ejection fraction heart failure, hypertension, diabetes mellitus, CKD, obesity and history of polysubstance abuse coming to [place] with complaints of swelling and fluid retention in arms and legs along with some central chest tightness and shortness of breath and all has been going on for about a week.  Pt admitted with CHF exacerbation.   Reviewed I/O's: +240 ml x 24 hours and +893 ml since admission  UOP: 350 ml x 24 hours  Spoke with pt at bedside, who reports feeling a little better today. She shares that she has a good appetite, consuming 100% of breakfast. Documented meal completions reveal she consumed 100% of meals during hospitalization.   Pt reports she eats well at home but does not follow a specific meal schedule ("I just eat all the time"). Pt shares she eats a lot of "what I'm not supposed to eat". Pt and her sister (whom she lives with) share cooking responsibilities. Frequently consumed foods include [place] cheesesteaks.   Pt unsure of her UBW or weight loss. Pt shares tat her weight does up and down. Per pt, she weighs herself weekly. She knows that she should weigh herself daily. When RD explored barriers  to self-management, pt reports "I'm just lazy". Tox screen positive for cocaine.   RD discussed importance of self-management for both CHF and DM. RD stressed importanc eof daily weights to monitor for fluid retention, possible avoidance of hospitalizations, and when to call MD with weight changes. Per pt, she sometimes has difficulty obtaining medications due to cost.   Medications reviewed and include lasix.   Lab Results  Component Value Date   HGBA1C 11.7 (H) 11/05/2022   PTA DM medications are 10 mg farxiga daily, 10 units lantus solostar daily, and 15 units lantus solostar daily.   Labs reviewed: Phos: 7.3, CBGS: 135-181 (inpatient orders for glycemic control are 0-15 units insulin aspart TID with 10 units insulin glargine-yfgn daily).    NUTRITION - FOCUSED PHYSICAL EXAM:  Flowsheet Row Most Recent Value  Orbital Region No depletion  Upper Arm Region No depletion  Thoracic and Lumbar Region No depletion  Buccal Region No depletion  Temple Region No depletion  Clavicle Bone Region No depletion  Clavicle and Acromion Bone Region No depletion  Scapular Bone Region No depletion  Dorsal Hand No depletion  Patellar Region No depletion  Anterior Thigh Region No depletion  Posterior Calf Region No depletion  Edema (RD Assessment) Moderate  Hair Reviewed  Eyes Reviewed  Mouth Reviewed  Skin Reviewed  Nails Reviewed       Diet Order:   Diet Order             Diet heart healthy/carb modified Room service appropriate? Yes; Fluid consistency: Thin; Fluid restriction: 1500 mL Fluid  Diet  effective now                   EDUCATION NEEDS:   Education needs have been addressed  Skin:  Skin Assessment: Reviewed RN Assessment  Last BM:  11/07/22  Height:   Ht Readings from Last 1 Encounters:  11/06/22 5\' 7"  (1.702 m)    Weight:   Wt Readings from Last 1 Encounters:  11/08/22 133.1 kg    Ideal Body Weight:  61.4 kg  BMI:  Body mass index is 45.96  kg/m.  Estimated Nutritional Needs:   Kcal:  1850-2050  Protein:  90-105 grams  Fluid:  1.5 L    Levada Schilling, RD, LDN, CDCES Registered Dietitian II Certified Diabetes Care and Education Specialist Please refer to [facility] for RD and/or RD on-call/weekend/after hours pager

## 2022-11-08 NOTE — Plan of Care (Signed)
?  Problem: Education: ?Goal: Ability to demonstrate management of disease process will improve ?Outcome: Progressing ?  ?

## 2022-11-08 NOTE — Consult Note (Signed)
Advanced Heart Failure Team Consult Note   Primary Physician: Margarita Mail, DO PCP-Cardiologist:  Debbe Odea, MD  Reason for Consultation: CHF  HPI:    Molly Howard is seen today for evaluation of CHF at the request of Dr. Ashok Pall.   37 y.o. with history of uncontrolled DM2 (last hgbA1c 11.7), HTN, active cocaine abuse, and biventricular failure was readmitted with acute on chronic systolic CHF.  Patient has had a cardiomyopathy known since 2021.  Echo in 6/21 showed EF 35-40%.  She had a cardiac MRI in 1/23 with LV EF 42%, mild LV dilation, RV EF 50%, no delayed enhancement.  Most recent echo in 3/24 showed EF 25-30%, mildly decreased RV systolic function, PASP 50 mmHg, moderate-severe TR, mild MR.  RHC done in 3/24 showed elevated R>L heart filling pressures, pulmonary venous hypertension, PAPi 0.8 and CI 2.  During admission in 3/24, she was markedly volume overloaded and was aggressively diuresed with Lasix gtt and milrinone gtt.   After discharge in 3/24, she never followed up with cardiology.  She has continued to use cocaine regularly.  UDS positive for cocaine this admission.  She says she ran out of all her meds at least 2 wks ago.  She has developed progressive leg then abdominal distention.  For the last week, she has been short of breath with just mild exertion.  +Orthopnea.  No lightheadedness.  She was admitted and started on Lasix 40 mg IV bid. I/Os do not appear complete and weight is all over the place. She does not feel any better.  Creatinine stable at 1.25.   Review of Systems: All systems reviewed and negative except as per HPI.   Home Medications Prior to Admission medications   Medication Sig Start Date End Date Taking? Authorizing Provider  atorvastatin (LIPITOR) 20 MG tablet Take 1 tablet (20 mg total) by mouth daily. 10/04/22  Yes Margarita Mail, DO  calcium carbonate (OS-CAL - DOSED IN MG OF ELEMENTAL CALCIUM) 1250 (500 Ca) MG tablet Take 1 tablet  (1,250 mg total) by mouth 3 (three) times daily with meals. 08/04/22  Yes [name], MD  carvedilol (COREG) 3.125 MG tablet Take 1 tablet (3.125 mg total) by mouth 2 (two) times daily with a meal. 10/04/22  Yes Margarita Mail, DO  dapagliflozin propanediol (FARXIGA) 10 MG TABS tablet Take 1 tablet (10 mg total) by mouth once daily. 10/04/22  Yes Margarita Mail, DO  digoxin (LANOXIN) 0.125 MG tablet Take 1 tablet (0.125 mg total) by mouth daily. 08/05/22  Yes [name], MD  ferrous sulfate 325 (65 FE) MG tablet Take 1 tablet (325 mg total) by mouth daily with breakfast. 08/04/22 11/06/22 Yes [name], MD  insulin glargine (LANTUS SOLOSTAR) 100 UNIT/ML Solostar Pen Take 10 units in the morning and 15 in the evenings. 10/04/22  Yes Margarita Mail, DO  sacubitril-valsartan (ENTRESTO) 49-51 MG Take 1 tablet by mouth 2 (two) times daily. 10/04/22  Yes Margarita Mail, DO  spironolactone (ALDACTONE) 25 MG tablet Take 1 tablet (25 mg total) by mouth daily. 10/04/22  Yes Margarita Mail, DO  Torsemide 40 MG TABS Take 40 mg by mouth in the morning and at bedtime. 10/04/22  Yes Margarita Mail, DO  Potassium Chloride 40 MEQ/15ML (20%) SOLN Take 40 mEq by mouth 2 (two) times daily. 07/04/20 10/18/20  Debbe Odea, MD    Past Medical History: Past Medical History:  Diagnosis Date   Acid reflux    Chronic HFrEF (heart failure with reduced  ejection fraction) (HCC)    a. 10/2019 Echo: EF 35-40%, GrII DD; b. 05/2020 Echo: EF 20-25%, glob HK; c. 10/2020 Echo: EF 30-35%, glob HK. GrII DD, Mildly red RV fxn. Mod TR; d.  05/2021 cMRI: EF 42%, no LGE. Nl RV size/fxn.   CKD (chronic kidney disease), stage II    Diabetes mellitus (HCC)    H/O medication noncompliance    Hypertension    Microcytic anemia    NICM (nonischemic cardiomyopathy) (HCC)    a. 10/2019 Echo: EF 35-40%; b. 10/2019 MV: No ischemia. Small apical defect-->breast attenuation; c. 05/2020 Echo: EF 20-25%; d. 10/2020 Echo: EF 30-35%, glob HK. GrII  DD, Mildly red RV fxn. Mod TR; e. 05/2021 cMRI: EF 42%, no LGE. Nl RV size/fxn.   Obesity    Polysubstance abuse [facility] [place])     Past Surgical History: Past Surgical History:  Procedure Laterality Date   CHOLECYSTECTOMY     HERNIA REPAIR     RIGHT HEART CATH N/A 07/28/2022   Procedure: RIGHT HEART CATH;  Surgeon: Iran Ouch, MD;  Location: [facility] INVASIVE CV LAB;  Service: Cardiovascular;  Laterality: N/A;    Family History: Family History  Problem Relation Age of Onset   Heart failure Mother        Onset of heart failure 22s.  Died in 2022/05/06.   Diabetes Mother    Hypertension Father    Diabetes Father     Social History: Social History   Socioeconomic History   Marital status: Single    Spouse name: Not on file   Number of children: Not on file   Years of education: Not on file   Highest education level: Not on file  Occupational History   Not on file  Tobacco Use   Smoking status: Former    Packs/day: .5    Types: Cigarettes    Quit date: 2022    Years since quitting: 2.5   Smokeless tobacco: Never  Vaping Use   Vaping Use: Never used  Substance and Sexual Activity   Alcohol use: Not Currently    Comment: ~ 4 shots/day on weekends only   Drug use: Not Currently    Types: Cocaine    Comment: Admits to using cocaine up and will February 2024.   Sexual activity: Not Currently    Birth control/protection: None  Other Topics Concern   Not on file  Social History Narrative   Lives locally.  Does not routinely exercise.  Has been using cocaine.   Social Determinants of Health   Financial Resource Strain: High Risk (06/16/2020)   Overall Financial Resource Strain (CARDIA)    Difficulty of Paying Living Expenses: Hard  Food Insecurity: No Food Insecurity (11/06/2022)   Hunger Vital Sign    Worried About Running Out of Food in the Last Year: Never true    Ran Out of Food in the Last Year: Never true  Transportation Needs: No Transportation Needs (11/06/2022)    PRAPARE - Administrator, Civil Service (Medical): No    Lack of Transportation (Non-Medical): No  Physical Activity: Not on file  Stress: Not on file  Social Connections: Not on file    Allergies:  Allergies  Allergen Reactions   No [employer] Food Allergies Rash and Other (See Comments)    Lemons    Objective:    Vital Signs:   Temp:  [97.5 F (36.4 C)-98.3 F (36.8 C)] 98.3 F (36.8 C) (07/08 1551) Pulse Rate:  [80-93]  86 (07/08 1551) Resp:  [14-19] 19 (07/08 1551) BP: (124-138)/(81-101) 129/81 (07/08 1551) SpO2:  [94 %-98 %] 94 % (07/08 1551) Weight:  [133.1 kg] 133.1 kg (07/08 0500) Last BM Date : 11/07/22  Weight change: Filed Weights   11/07/22 0830 11/07/22 0933 11/08/22 0500  Weight: 127.8 kg 130.6 kg 133.1 kg    Intake/Output:   Intake/Output Summary (Last 24 hours) at 11/08/2022 1647 Last data filed at 11/08/2022 1525 Gross per 24 hour  Intake 600 ml  Output 400 ml  Net 200 ml      Physical Exam    General:  Well appearing. No resp difficulty HEENT: normal Neck: supple. JVP 16+. Carotids 2+ bilat; no bruits. No lymphadenopathy or thyromegaly appreciated. Cor: PMI lateral. Regular rate & rhythm. +S3, 2/6 HSM LLSB.  Lungs: clear Abdomen: soft, nontender, moderately distended. No hepatosplenomegaly. No bruits or masses. Good bowel sounds. Extremities: no cyanosis, clubbing, rash. 2+ edema to abdomen.  Neuro: alert & orientedx3, cranial nerves grossly intact. moves all 4 extremities w/o difficulty. Affect pleasant   Telemetry   Not on telemetry  EKG    NSR, nonspecific T wave flattening  Labs   Basic Metabolic Panel: Recent Labs  Lab 11/05/22 1953 11/06/22 0223 11/07/22 0440 11/08/22 0420  NA 137 135 134* 135  K 4.5 4.1 3.8 4.1  CL 98 99 99 98  CO2 30 26 27 27   GLUCOSE 229* 262* 188* 184*  BUN 23* 23* 26* 32*  CREATININE 1.24* 1.11* 1.16* 1.25*  CALCIUM 6.6* 6.5* 6.5* 6.8*  MG  --  1.4* 1.7 1.7    Liver Function  Tests: Recent Labs  Lab 11/05/22 2159 11/06/22 0223  AST 20 18  ALT 13 11  ALKPHOS 164* 167*  BILITOT 0.6 0.7  PROT 6.1* 6.0*  ALBUMIN 1.7* 1.8*   No results for input(s): "LIPASE", "AMYLASE" in the last 168 hours. No results for input(s): "AMMONIA" in the last 168 hours.  CBC: Recent Labs  Lab 11/05/22 1953 11/06/22 0223 11/07/22 0440 11/08/22 0420  WBC 4.8 4.7 4.4 4.6  NEUTROABS  --  3.3  --   --   HGB 11.3* 10.6* 10.2* 10.2*  HCT 38.8 36.5 34.1* 33.8*  MCV 91.7 90.1 89.0 88.9  PLT 333 336 304 313    Cardiac Enzymes: No results for input(s): "CKTOTAL", "CKMB", "CKMBINDEX", "TROPONINI" in the last 168 hours.  BNP: BNP (last 3 results) Recent Labs    02/09/22 1214 07/24/22 1619 11/05/22 1953  BNP 859.3* 1,400.4* 1,267.5*    ProBNP (last 3 results) No results for input(s): "PROBNP" in the last 8760 hours.   CBG: Recent Labs  Lab 11/07/22 1703 11/07/22 2151 11/08/22 0829 11/08/22 1200 11/08/22 1544  GLUCAP 119* 183* 135* 181* 214*    Coagulation Studies: No results for input(s): "LABPROT", "INR" in the last 72 hours.   Imaging   [place] EKG SITE RITE  Result Date: 11/08/2022 If Site Rite image not attached, placement could not be confirmed due to current cardiac rhythm.    Medications:     Current Medications:  atorvastatin  20 mg Oral Daily   carvedilol  3.125 mg Oral BID WC   digoxin  0.125 mg Oral Daily   furosemide  80 mg Intravenous Once   insulin aspart  0-15 Units Subcutaneous TID WC   insulin glargine-yfgn  10 Units Subcutaneous QHS   magnesium oxide  800 mg Oral Daily   multivitamin with minerals  1 tablet Oral Daily   pantoprazole  40 mg Oral BID   rivaroxaban  10 mg Oral Daily   sacubitril-valsartan  1 tablet Oral BID   sodium chloride flush  3 mL Intravenous Q12H    Infusions:  furosemide (LASIX) 200 mg in dextrose 5 % 100 mL (2 mg/mL) infusion      Assessment/Plan   1. Acute on chronic systolic CHF: Suspected NICM due  to cocaine and uncontrolled HTN. Cardiac MRI in 1/23 showed no delayed enhancement, so no evidence for prior MI, infiltrative disease, or myocarditis. RHC in 3/24 showed severely elevated R>L heart filling pressures with low PAPi and CI 2.  Worrisome situation as she has quite severe RV failure. Most recent echo in 3/24 showed EF 25-30%, mildly decreased RV systolic function, PASP 50 mmHg, moderate-severe TR, mild MR.  She has not followed up with cardiology and ran out of all meds > 2 wks ago.  She is markedly volume overloaded on exam.  BP is elevated.  Creatinine stable.  At last admission, she required milrinone due to low output. She is still using cocaine.  - Place PICC, follow CVP and co-ox. If co-ox low or lactate elevated, will start milrinone 0.25. Will need transfer to stepdown.  - Lasix 80 mg IV x 1 now then start Lasix gtt at 12 mg/hr.  - OK to continue low dose Coreg 3.125 mg bid for now.  - Continue digoxin 0.125 daily.  - Add Entresto 24/26 bid.  - With active cocaine use, not candidate for advanced therapies or ICD.  Narrow QRS so would not benefit from CRT.  2. Cocaine abuse: UDS positive again.  Urged her to quit.  3. Anemia: H/o menorrhagia.  - Check Fe studies, IV Fe if low.  4. Tricuspid regurgitation: Moderate-severe last echo in setting of RV failure. RV failure will make volume difficult to manage.  5. Type 2 diabetes: Poor control, hgbA1c 11.7.   Length of Stay: 2  Marca Ancona, MD  11/08/2022, 4:47 PM  Advanced Heart Failure Team Pager [phone] (M-F; 7a - 5p)  Please contact [facility] for night-coverage after hours (4p -7a ) and weekends on [email or web address]

## 2022-11-09 LAB — BASIC METABOLIC PANEL
Anion gap: 9 (ref 5–15)
BUN: 37 mg/dL — ABNORMAL HIGH (ref 6–20)
CO2: 28 mmol/L (ref 22–32)
Calcium: 6.7 mg/dL — ABNORMAL LOW (ref 8.9–10.3)
Chloride: 97 mmol/L — ABNORMAL LOW (ref 98–111)
Creatinine, Ser: 1.23 mg/dL — ABNORMAL HIGH (ref 0.44–1.00)
GFR, Estimated: 58 mL/min — ABNORMAL LOW (ref >60)
Glucose, Bld: 173 mg/dL — ABNORMAL HIGH (ref 70–99)
Potassium: 4.1 mmol/L (ref 3.5–5.1)
Sodium: 134 mmol/L — ABNORMAL LOW (ref 135–145)

## 2022-11-09 LAB — CARBOXYHEMOGLOBIN - COOX: Carboxyhemoglobin: 2 % — ABNORMAL HIGH (ref 0.5–1.5)

## 2022-11-09 LAB — GLUCOSE, CAPILLARY
Glucose-Capillary: 143 mg/dL — ABNORMAL HIGH (ref 70–99)
Glucose-Capillary: 154 mg/dL — ABNORMAL HIGH (ref 70–99)
Glucose-Capillary: 164 mg/dL — ABNORMAL HIGH (ref 70–99)
Glucose-Capillary: 171 mg/dL — ABNORMAL HIGH (ref 70–99)
Glucose-Capillary: 224 mg/dL — ABNORMAL HIGH (ref 70–99)
Glucose-Capillary: 229 mg/dL — ABNORMAL HIGH (ref 70–99)
Glucose-Capillary: 235 mg/dL — ABNORMAL HIGH (ref 70–99)

## 2022-11-09 LAB — COOXEMETRY PANEL
Carboxyhemoglobin: 1.3 % (ref 0.5–1.5)
Methemoglobin: <0.7 % (ref 0.0–1.5)
O2 Saturation: 58.6 %
Total hemoglobin: 10.3 g/dL — ABNORMAL LOW (ref 12.0–16.0)
Total oxygen content: 57.6 %

## 2022-11-09 MED ORDER — SODIUM CHLORIDE 0.9 % IV SOLN
250.0000 mg | Freq: Every day | INTRAVENOUS | Status: AC
  Administered 2022-11-09 – 2022-11-10 (×2): 250 mg via INTRAVENOUS
  Filled 2022-11-09 (×2): qty 20

## 2022-11-09 MED ORDER — ORAL CARE MOUTH RINSE: 15.0000 mL | OROMUCOSAL | Status: DC | PRN

## 2022-11-09 MED ORDER — SODIUM CHLORIDE 0.9% FLUSH
10.0000 mL | Freq: Two times a day (BID) | INTRAVENOUS | Status: DC
  Administered 2022-11-09 – 2022-11-15 (×12): 10 mL

## 2022-11-09 MED ORDER — SODIUM CHLORIDE 0.9% FLUSH: 10.0000 mL | INTRAVENOUS | Status: DC | PRN

## 2022-11-09 NOTE — Progress Notes (Signed)
PROGRESS NOTE   HPI was taken from Dr. Irena Cords: Molly Howard is a 37 y.o. female with medical history significant for reduced ejection fraction heart failure, hypertension, diabetes mellitus, CKD, obesity and history of polysubstance abuse coming to [place] with complaints of swelling and fluid retention in arms and legs along with some central chest tightness and shortness of breath and all has been going on for about a week.  Patient states the chest pain is off and on it is really a chest tightness that is worse only when she is sleeping and it resolves without any medications.  She has not tried any intervention.  Patient has not been able to fill her medications .Chart review shows primary care note from [facility] patient's noncompliance with cardiology and heart failure clinic patient has had a death in her family and her mom has passed away not kept any of her appointments.  Patient also has past medical history of anemia secondary to menorrhagia is being currently followed by OB/GYN and an ultrasound that showed an endometrial polyp.  Patient's been prescribed oral contraceptives that she has not been able to purchase.  Patient's most recent echocardiogram done earlier this year in March showed an EF of 39%.    LEANNA Howard  HYQ:[id number] DOB: May 19, 1985 DOA: 11/05/2022 PCP: Margarita Mail, DO   Assessment & Plan:   Principal Problem:   Acute on chronic combined systolic and diastolic CHF (congestive heart failure) (HCC) Active Problems:   Chronic anemia   Essential hypertension   Diabetes mellitus, type II (HCC)   Chronic kidney disease (CKD), stage II (mild)   GERD without esophagitis   Swelling   Tobacco use   Hypomagnesemia   Cocaine abuse (HCC)   Bilateral leg edema  Assessment and Plan: Acute on chronic combined CHF exacerbation with biventricular failure: reports dry weight of around 230 pounds. Edema to abdomen. Has not diuresed well here so advanced heart failure team  consulted on 7/8. Now in step-down with picc, on lasix gtt with milrinone and diuresing well, out little over 3 liters yesterday. Marland Kitchen  Has not f/u outpatient w/ cardio in at least 1 year secondary to not having transportation as per pt.  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 H&H are stable   HTN: continue on home dose of coreg. Cardiology has re-started entresto  DM2: poorly controlled, HbA1c 11.7. Continue on glargine, SSI w/ accuchecks   CKDII: Cr today is stable   Cocaine abuse: urine drug screen was positive for cocaine. Cocaine use cessation counseling  Hypomagnesemia: now on oral Mg  Smoker: nicotine patch to prevent w/drawal.   Morbid obesity: BMI 44.8. Complicates overall care & prognosis   Left arm swelling: pvl negative for dvt/stenosis. X-ray nothing acute. Possibly 2/2 positioning and edematous state, she says she does sleep on her left side. Improving with diuresis   DVT prophylaxis: heparin SQ but refuses this. Xarelto for DVT prophylaxis inpatient only  Code Status: full  Family Communication: none @ bedside Disposition Plan: likely d/c back home   Level of care: Stepdown Status is: Inpatient Remains inpatient appropriate because: requiring IV diuresis    Consultants:    Procedures:   Antimicrobials:    Subjective: Mild sob when lies flat, thinks left arm swelling improving  Objective: Vitals:   11/09/22 0700 11/09/22 0800 11/09/22 0900 11/09/22 1000  BP: 118/71 134/89 123/79 122/85  Pulse: 88 89 91 86  Resp: 13 (!) 7 (!) 24 (!) 0  Temp:  98.3  F (36.8 C)    TempSrc:  Oral    SpO2: 98% 97% 91% 93%  Weight:      Height:        Intake/Output Summary (Last 24 hours) at 11/09/2022 1318 Last data filed at 11/09/2022 1317 Gross per 24 hour  Intake 1117.52 ml  Output 4825 ml  Net -3707.48 ml   Filed Weights   11/08/22 0500 11/08/22 1751 11/09/22 0500  Weight: 133.1 kg (!) 136.2 kg 130.6 kg    Examination:  General exam: Appears calm and  comfortable  Respiratory system: Clear to auscultation. Respiratory effort normal. Cardiovascular system: distant heart sounds Gastrointestinal system: edema of abdomen Extremities: pitting edema b/l legs and left arm Central nervous system: Alert and oriented. Moves all extremities  Psychiatry: Judgement and insight appear normal. Mood & affect appropriate.     Data Reviewed: I have personally reviewed following labs and imaging studies  CBC: Recent Labs  Lab 11/05/22 1953 11/06/22 0223 11/07/22 0440 11/08/22 0420  WBC 4.8 4.7 4.4 4.6  NEUTROABS  --  3.3  --   --   HGB 11.3* 10.6* 10.2* 10.2*  HCT 38.8 36.5 34.1* 33.8*  MCV 91.7 90.1 89.0 88.9  PLT 333 336 304 313   Basic Metabolic Panel: Recent Labs  Lab 11/05/22 1953 11/06/22 0223 11/07/22 0440 11/08/22 0420 11/09/22 0453  NA 137 135 134* 135 134*  K 4.5 4.1 3.8 4.1 4.1  CL 98 99 99 98 97*  CO2 30 26 27 27 28   GLUCOSE 229* 262* 188* 184* 173*  BUN 23* 23* 26* 32* 37*  CREATININE 1.24* 1.11* 1.16* 1.25* 1.23*  CALCIUM 6.6* 6.5* 6.5* 6.8* 6.7*  MG  --  1.4* 1.7 1.7  --    GFR: Estimated Creatinine Clearance: 87.3 mL/min (A) (by C-G formula based on SCr of 1.23 mg/dL (H)). Liver Function Tests: Recent Labs  Lab 11/05/22 2159 11/06/22 0223  AST 20 18  ALT 13 11  ALKPHOS 164* 167*  BILITOT 0.6 0.7  PROT 6.1* 6.0*  ALBUMIN 1.7* 1.8*   No results for input(s): "LIPASE", "AMYLASE" in the last 168 hours. No results for input(s): "AMMONIA" in the last 168 hours. Coagulation Profile: No results for input(s): "INR", "PROTIME" in the last 168 hours. Cardiac Enzymes: No results for input(s): "CKTOTAL", "CKMB", "CKMBINDEX", "TROPONINI" in the last 168 hours. BNP (last 3 results) No results for input(s): "PROBNP" in the last 8760 hours. HbA1C: No results for input(s): "HGBA1C" in the last 72 hours.  CBG: Recent Labs  Lab 11/08/22 1749 11/09/22 0009 11/09/22 0343 11/09/22 0724 11/09/22 1136  GLUCAP 224*  164* 171* 143* 154*   Lipid Profile: No results for input(s): "CHOL", "HDL", "LDLCALC", "TRIG", "CHOLHDL", "LDLDIRECT" in the last 72 hours. Thyroid Function Tests: No results for input(s): "TSH", "T4TOTAL", "FREET4", "T3FREE", "THYROIDAB" in the last 72 hours.  Anemia Panel: Recent Labs    11/08/22 1800  FERRITIN 13  TIBC 308  IRON 39   Sepsis Labs: Recent Labs  Lab 11/08/22 1800 11/08/22 2129  LATICACIDVEN 2.0* 1.3    Recent Results (from the past 240 hour(s))  MRSA Next Gen by PCR, Nasal     Status: None   Collection Time: 11/08/22  6:00 PM   Specimen: Nasal Mucosa; Nasal Swab  Result Value Ref Range Status   MRSA by PCR Next Gen NOT DETECTED NOT DETECTED Final    Comment: (NOTE) The GeneXpert MRSA Assay (FDA approved for NASAL specimens only), is one component of a  comprehensive MRSA colonization surveillance program. It is not intended to diagnose MRSA infection nor to guide or monitor treatment for MRSA infections. Test performance is not FDA approved in patients less than 16 years old. Performed at [facility], [address] [place], [place] [address]          Radiology Studies: [place] EKG SITE RITE  Result Date: 11/08/2022 If [facility] image not attached, placement could not be confirmed due to current cardiac rhythm.  DG Shoulder Left  Result Date: 11/07/2022 CLINICAL DATA:  Left shoulder pain.  Fall 3 months ago. EXAM: LEFT SHOULDER - 2+ VIEW COMPARISON:  Shoulder radiograph 08/08/2021 FINDINGS: No acute or evidence of prior fracture. Mild degenerative spurring of the glenohumeral joint. Chronic spurring about the acromion. No erosive or bony destructive change. There is mild generalized soft tissue edema. IMPRESSION: 1. Mild degenerative spurring of the glenohumeral joint. 2. Chronic spurring about the acromion. 3. Soft tissue edema. Electronically Signed   By: Narda Rutherford M.D.   On: 11/07/2022 15:21        Scheduled Meds:  atorvastatin   20 mg Oral Daily   carvedilol  3.125 mg Oral BID WC   Chlorhexidine Gluconate Cloth  6 each Topical Daily   digoxin  0.125 mg Oral Daily   insulin aspart  0-15 Units Subcutaneous TID WC   insulin glargine-yfgn  10 Units Subcutaneous QHS   magnesium oxide  800 mg Oral Daily   multivitamin with minerals  1 tablet Oral Daily   pantoprazole  40 mg Oral BID   rivaroxaban  10 mg Oral Daily   sacubitril-valsartan  1 tablet Oral BID   sodium chloride flush  10-40 mL Intracatheter Q12H   sodium chloride flush  3 mL Intravenous Q12H   Continuous Infusions:  ferric gluconate (FERRLECIT) IVPB Stopped (11/09/22 1156)   furosemide (LASIX) 200 mg in dextrose 5 % 100 mL (2 mg/mL) infusion 12 mg/hr (11/09/22 1200)   milrinone 0.25 mcg/kg/min (11/09/22 1200)      LOS: 3 days    Time spent: 35 mins     Silvano Bilis, MD [facility]     If 7PM-7AM, please contact night-coverage [email or web address]  11/09/2022, 1:18 PM

## 2022-11-09 NOTE — Progress Notes (Addendum)
1910 patient alert x4 on 2l [place]phir pateint on IV drips per orders no concerns at this time education with fluid restriction as well as diet concerns that patient should be following here and at home.  11/10/22 patient wet bed twice throughout the night bath and CHG completed this morning.

## 2022-11-09 NOTE — Progress Notes (Signed)
Patient ID: Molly Howard, female   DOB: [birth year], 37 y.o.   MRN: [id number]     Advanced Heart Failure Rounding Note  PCP-Cardiologist: Debbe Odea, MD   Subjective:    Feeling better this morning.   Lactate 2 yesterday pm, started on milrinone 0.25 and lactate down to 1.3 this morning.  She is on Lasix gtt 12 mg/hr and diuresed well, I/Os net negative 2426 cc. BP stable. Creatinine 1.25 => 1.23.   Left arm swelling: ultrasound did not show DVT.    Objective:   Weight Range: 130.6 kg Body mass index is 45.09 kg/m.   Vital Signs:   Temp:  [97.5 F (36.4 C)-98.5 F (36.9 C)] 98 F (36.7 C) (07/09 0400) Pulse Rate:  [80-89] 88 (07/09 0700) Resp:  [0-26] 13 (07/09 0700) BP: (118-149)/(71-103) 118/71 (07/09 0700) SpO2:  [78 %-98 %] 98 % (07/09 0700) Weight:  [130.6 kg-136.2 kg] 130.6 kg (07/09 0500) Last BM Date : 11/07/22  Weight change: Filed Weights   11/08/22 0500 11/08/22 1751 11/09/22 0500  Weight: 133.1 kg (!) 136.2 kg 130.6 kg    Intake/Output:   Intake/Output Summary (Last 24 hours) at 11/09/2022 0812 Last data filed at 11/09/2022 0600 Gross per 24 hour  Intake 749.5 ml  Output 3175 ml  Net -2425.5 ml      Physical Exam    General:  Well appearing. No resp difficulty HEENT: Normal Neck: Supple. JVP 16+. Carotids 2+ bilat; no bruits. No lymphadenopathy or thyromegaly appreciated. Cor: PMI lateral. Regular rate & rhythm. +S3. 2/6 HSM LLSB/apex. Lungs: Decreased at bases.  Abdomen: Soft, nontender, nondistended. No hepatosplenomegaly. No bruits or masses. Good bowel sounds. Extremities: No cyanosis, clubbing, rash. 1+ edema to abdomen. Left arm swelling.  Neuro: Alert & orientedx3, cranial nerves grossly intact. moves all 4 extremities w/o difficulty. Affect pleasant   Telemetry   NSR 90s (personally reviewed)  Labs    CBC Recent Labs    11/07/22 0440 11/08/22 0420  WBC 4.4 4.6  HGB 10.2* 10.2*  HCT 34.1* 33.8*  MCV 89.0 88.9  PLT 304  313   Basic Metabolic Panel Recent Labs    40/98/11 0440 11/08/22 0420 11/09/22 0453  NA 134* 135 134*  K 3.8 4.1 4.1  CL 99 98 97*  CO2 27 27 28   GLUCOSE 188* 184* 173*  BUN 26* 32* 37*  CREATININE 1.16* 1.25* 1.23*  CALCIUM 6.5* 6.8* 6.7*  MG 1.7 1.7  --    Liver Function Tests No results for input(s): "AST", "ALT", "ALKPHOS", "BILITOT", "PROT", "ALBUMIN" in the last 72 hours. No results for input(s): "LIPASE", "AMYLASE" in the last 72 hours. Cardiac Enzymes No results for input(s): "CKTOTAL", "CKMB", "CKMBINDEX", "TROPONINI" in the last 72 hours.  BNP: BNP (last 3 results) Recent Labs    02/09/22 1214 07/24/22 1619 11/05/22 1953  BNP 859.3* 1,400.4* 1,267.5*    ProBNP (last 3 results) No results for input(s): "PROBNP" in the last 8760 hours.   D-Dimer No results for input(s): "DDIMER" in the last 72 hours. Hemoglobin A1C No results for input(s): "HGBA1C" in the last 72 hours. Fasting Lipid Panel No results for input(s): "CHOL", "HDL", "LDLCALC", "TRIG", "CHOLHDL", "LDLDIRECT" in the last 72 hours. Thyroid Function Tests No results for input(s): "TSH", "T4TOTAL", "T3FREE", "THYROIDAB" in the last 72 hours.  Invalid input(s): "FREET3"  Other results:   Imaging    [place] EKG SITE RITE  Result Date: 11/08/2022 If Site Rite image not attached, placement could not be confirmed  due to current cardiac rhythm.    Medications:     Scheduled Medications:  atorvastatin  20 mg Oral Daily   carvedilol  3.125 mg Oral BID WC   Chlorhexidine Gluconate Cloth  6 each Topical Daily   digoxin  0.125 mg Oral Daily   insulin aspart  0-15 Units Subcutaneous TID WC   insulin glargine-yfgn  10 Units Subcutaneous QHS   magnesium oxide  800 mg Oral Daily   multivitamin with minerals  1 tablet Oral Daily   pantoprazole  40 mg Oral BID   rivaroxaban  10 mg Oral Daily   sacubitril-valsartan  1 tablet Oral BID   sodium chloride flush  3 mL Intravenous Q12H    Infusions:   furosemide (LASIX) 200 mg in dextrose 5 % 100 mL (2 mg/mL) infusion 12 mg/hr (11/09/22 0741)   milrinone 0.25 mcg/kg/min (11/09/22 0742)    PRN Medications: acetaminophen **OR** acetaminophen, guaiFENesin, hydrALAZINE, nitroGLYCERIN, ondansetron **OR** ondansetron (ZOFRAN) IV, mouth rinse    Assessment/Plan   1. 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 Cardiac MRI in 1/23 showed no delayed enhancement, so no evidence for prior MI, infiltrative disease, or myocarditis. RHC in 3/24 showed severely elevated R>L heart filling pressures with low PAPi and CI 2.  Worrisome situation as she has quite severe RV failure. Most recent echo in 3/24 showed EF 25-30%, mildly decreased RV systolic function, PASP 50 mmHg, moderate-severe TR, mild MR.  She has not followed up with cardiology and ran out of all meds > 2 wks ago.  Still positive for cocaine on UDS.  Lactate elevated at 2, she was started on milrinone 0.25 + Lasix gtt 12 mg/hr for marked volume overload.  Lactate 1.3 today. She is markedly volume overloaded on exam still but starting to diurese.  - Place PICC, follow CVP and co-ox.  - Continue milrinone 0.25 mcg/kg/min. - Continue Lasix gtt at 12 mg/hr.  - OK to continue low dose Coreg 3.125 mg bid for now.  - Continue digoxin 0.125 daily.  - Continue Entresto 24/26 bid.  - If creatinine remains stable, can add spironolactone.  - With active cocaine use, not candidate for advanced therapies or ICD.  Narrow QRS so would not benefit from CRT.  2. Cocaine abuse: UDS positive again.  Urged her to quit.  3. Anemia: H/o menorrhagia. Fe deficient.  - Will give IV Fe.  4. Tricuspid regurgitation: Moderate-severe last echo in setting of RV failure. RV failure will make volume difficult to manage.  5. Type 2 diabetes: Poor control, hgbA1c 11.7.  - Per primary.    Length of Stay: 3  Marca Ancona, MD  11/09/2022, 8:12 AM  Advanced Heart Failure Team Pager  [phone] (M-F; 7a - 5p)  Please contact [facility] for night-coverage after hours (5p -7a ) and weekends on [email or web address]

## 2022-11-09 NOTE — Plan of Care (Signed)
  Problem: Education: Goal: Ability to demonstrate management of disease process will improve Outcome: Not Progressing Goal: Ability to verbalize understanding of medication therapies will improve Outcome: Not Progressing Goal: Individualized Educational Video(s) Outcome: Not Progressing   Problem: Activity: Goal: Capacity to carry out activities will improve Outcome: Not Progressing   Problem: Cardiac: Goal: Ability to achieve and maintain adequate cardiopulmonary perfusion will improve Outcome: Not Progressing   Problem: Education: Goal: Ability to describe self-care measures that may prevent or decrease complications (Diabetes Survival Skills Education) will improve Outcome: Not Progressing Goal: Individualized Educational Video(s) Outcome: Not Progressing   Problem: Coping: Goal: Ability to adjust to condition or change in health will improve Outcome: Not Progressing   Problem: Fluid Volume: Goal: Ability to maintain a balanced intake and output will improve Outcome: Not Progressing   Problem: Health Behavior/Discharge Planning: Goal: Ability to identify and utilize available resources and services will improve Outcome: Not Progressing Goal: Ability to manage health-related needs will improve Outcome: Not Progressing   Problem: Metabolic: Goal: Ability to maintain appropriate glucose levels will improve Outcome: Not Progressing   Problem: Nutritional: Goal: Maintenance of adequate nutrition will improve Outcome: Not Progressing Goal: Progress toward achieving an optimal weight will improve Outcome: Not Progressing   Problem: Skin Integrity: Goal: Risk for impaired skin integrity will decrease Outcome: Not Progressing   Problem: Tissue Perfusion: Goal: Adequacy of tissue perfusion will improve Outcome: Not Progressing   Problem: Education: Goal: Knowledge of General Education information will improve Description: Including pain rating scale,  medication(s)/side effects and non-pharmacologic comfort measures Outcome: Not Progressing Patient not following ordered diet on lasix gtt having good output using BSC no pain

## 2022-11-09 NOTE — Progress Notes (Signed)
Peripherally Inserted Central Catheter Placement  The IV Nurse has discussed with the patient and/or persons authorized to consent for the patient, the purpose of this procedure and the potential benefits and risks involved with this procedure.  The benefits include less needle sticks, lab draws from the catheter, and the patient may be discharged home with the catheter. Risks include, but not limited to, infection, bleeding, blood clot (thrombus formation), and puncture of an artery; nerve damage and irregular heartbeat and possibility to perform a PICC exchange if needed/ordered by physician.  Alternatives to this procedure were also discussed.  Bard Power PICC patient education guide, fact sheet on infection prevention and patient information card has been provided to patient /or left at bedside.    PICC Placement Documentation  PICC Triple Lumen 11/09/22 Right Basilic 38 cm 1 cm (Active)  Indication for Insertion or Continuance of Line Vasoactive infusions 11/09/22 1245  Exposed Catheter (cm) 1 cm 11/09/22 1245  Site Assessment Clean, Dry, Intact 11/09/22 1245  Lumen #1 Status Flushed;Saline locked;Blood return noted 11/09/22 1245  Lumen #2 Status Flushed;Saline locked;Blood return noted 11/09/22 1245  Lumen #3 Status Flushed;Saline locked;Blood return noted 11/09/22 1245  Dressing Type Transparent;Securing device 11/09/22 1245  Dressing Status Antimicrobial disc in place;Clean, Dry, Intact 11/09/22 1245  Dressing Intervention New dressing;Other (Comment) 11/09/22 1245  Dressing Change Due 11/16/22 11/09/22 1245       Reginia Forts Albarece 11/09/2022, 1:12 PM

## 2022-11-10 DIAGNOSIS — I5043 Acute on chronic combined systolic (congestive) and diastolic (congestive) heart failure: Secondary | ICD-10-CM | POA: Diagnosis not present

## 2022-11-10 LAB — COOXEMETRY PANEL
Carboxyhemoglobin: 2.7 % — ABNORMAL HIGH (ref 0.5–1.5)
Methemoglobin: <0.7 % (ref 0.0–1.5)
O2 Saturation: 64.3 %
Total hemoglobin: 11 g/dL — ABNORMAL LOW (ref 12.0–16.0)
Total oxygen content: 62.2 %

## 2022-11-10 LAB — BASIC METABOLIC PANEL
Anion gap: 8 (ref 5–15)
BUN: 37 mg/dL — ABNORMAL HIGH (ref 6–20)
CO2: 29 mmol/L (ref 22–32)
Calcium: 6.8 mg/dL — ABNORMAL LOW (ref 8.9–10.3)
Chloride: 95 mmol/L — ABNORMAL LOW (ref 98–111)
Creatinine, Ser: 1.13 mg/dL — ABNORMAL HIGH (ref 0.44–1.00)
GFR, Estimated: >60 mL/min (ref >60)
Glucose, Bld: 285 mg/dL — ABNORMAL HIGH (ref 70–99)
Potassium: 3.9 mmol/L (ref 3.5–5.1)
Sodium: 132 mmol/L — ABNORMAL LOW (ref 135–145)

## 2022-11-10 LAB — GLUCOSE, CAPILLARY
Glucose-Capillary: 115 mg/dL — ABNORMAL HIGH (ref 70–99)
Glucose-Capillary: 157 mg/dL — ABNORMAL HIGH (ref 70–99)
Glucose-Capillary: 197 mg/dL — ABNORMAL HIGH (ref 70–99)
Glucose-Capillary: 195 mg/dL — ABNORMAL HIGH (ref 70–99)

## 2022-11-10 LAB — MAGNESIUM: Magnesium: 1.6 mg/dL — ABNORMAL LOW (ref 1.7–2.4)

## 2022-11-10 MED ORDER — DAPAGLIFLOZIN PROPANEDIOL 10 MG PO TABS
10.0000 mg | ORAL_TABLET | Freq: Every day | ORAL | Status: DC
  Administered 2022-11-10 – 2022-11-15 (×6): 10 mg via ORAL
  Filled 2022-11-10 (×6): qty 1

## 2022-11-10 MED ORDER — MAGNESIUM SULFATE 2 GM/50ML IV SOLN
2.0000 g | Freq: Once | INTRAVENOUS | Status: AC
  Administered 2022-11-10: 2 g via INTRAVENOUS
  Filled 2022-11-10: qty 50

## 2022-11-10 MED ORDER — SPIRONOLACTONE 12.5 MG HALF TABLET
12.5000 mg | ORAL_TABLET | Freq: Every day | ORAL | Status: DC
  Administered 2022-11-10: 12.5 mg via ORAL
  Filled 2022-11-10 (×2): qty 1

## 2022-11-10 MED ORDER — MAGNESIUM SULFATE 50 % IJ SOLN: 3.0000 g | Freq: Once | INTRAVENOUS | Status: DC

## 2022-11-10 MED ORDER — INSULIN GLARGINE-YFGN 100 UNIT/ML ~~LOC~~ SOLN
12.0000 [IU] | Freq: Every day | SUBCUTANEOUS | Status: DC
  Administered 2022-11-10 – 2022-11-11 (×2): 12 [IU] via SUBCUTANEOUS
  Filled 2022-11-10 (×2): qty 0.12

## 2022-11-10 MED ORDER — POTASSIUM CHLORIDE CRYS ER 20 MEQ PO TBCR
40.0000 meq | EXTENDED_RELEASE_TABLET | Freq: Once | ORAL | Status: AC
  Administered 2022-11-10: 40 meq via ORAL
  Filled 2022-11-10: qty 2

## 2022-11-10 MED ORDER — MAGNESIUM SULFATE IN D5W 1-5 GM/100ML-% IV SOLN
1.0000 g | Freq: Once | INTRAVENOUS | Status: AC
  Administered 2022-11-10: 1 g via INTRAVENOUS
  Filled 2022-11-10: qty 100

## 2022-11-10 NOTE — Progress Notes (Signed)
[facility]  - [place] at [facility]   PATIENT NAME: Molly Howard    MR#:  [id number]  DATE OF BIRTH:  14-Nov-1985  SUBJECTIVE:   No family at bedside. Patient sitting up in the chair. On room air. Vitals stable. On IV milrinone gtt advance heart failure cardiology group. Dietary discretion  VITALS:  Blood pressure (!) 153/85, pulse 91, temperature 98.4 F (36.9 C), temperature source Oral, resp. rate 16, height 5\' 7"  (1.702 m), weight 127.7 kg, SpO2 100 %.  PHYSICAL EXAMINATION:   GENERAL:  37 y.o.-year-old patient with no acute distress. Obese LUNGS: Normal breath sounds bilaterally, no wheezing CARDIOVASCULAR: S1, S2 normal. No murmur   ABDOMEN: Soft, nontender, nondistended. Bowel sounds present.  EXTREMITIES: +edema b/l.    NEUROLOGIC: nonfocal  patient is alert and awake SKIN: No obvious rash, lesion, or ulcer.   LABORATORY PANEL:  CBC Recent Labs  Lab 11/08/22 0420  WBC 4.6  HGB 10.2*  HCT 33.8*  PLT 313    Chemistries  Recent Labs  Lab 11/06/22 0223 11/07/22 0440 11/10/22 0451  NA 135   < > 132*  K 4.1   < > 3.9  CL 99   < > 95*  CO2 26   < > 29  GLUCOSE 262*   < > 285*  BUN 23*   < > 37*  CREATININE 1.11*   < > 1.13*  CALCIUM 6.5*   < > 6.8*  MG 1.4*   < > 1.6*  AST 18  --   --   ALT 11  --   --   ALKPHOS 167*  --   --   BILITOT 0.7  --   --    < > = values in this interval not displayed.   Cardiac Enzymes No results for input(s): "TROPONINI" in the last 168 hours. RADIOLOGY:  [place] EKG SITE RITE  Result Date: 11/08/2022 If Site Rite image not attached, placement could not be confirmed due to current cardiac rhythm.   Assessment and Plan  Molly Howard is a 37 y.o. female with medical history significant for reduced ejection fraction heart failure, hypertension, diabetes mellitus, CKD, obesity and history of polysubstance abuse coming to [place] with complaints of swelling and fluid retention in arms and legs along with some central  chest tightness and shortness of breath and all has been going on for about a week.   Acute on chronic systolic CHF exacerbation with biventricular failure: Noncompliance to diet/Meds and f/u with cardiology -- reports dry weight of around 230 pounds. Edema to abdomen.  --Has not diuresed well here so advanced heart failure team consulted on 7/8.  --Now in step-down with picc, on lasix gtt with milrinone and diuresing well, UOP  5.7 liters yesterday. --Has not f/u outpatient w/ cardio in at least 1 year secondary to not having transportation as per pt. --Most recent echo in 3/24 showed EF 25-30%, mildly decreased RV systolic function, PASP 50 mmHg, moderate-severe TR, mild MR.     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 H&H are stable  Received IV ferrilecit   HTN: continue on home dose of coreg. Cardiology has re-started entresto   DM2: poorly controlled, HbA1c 11.7. Continue on glargine, SSI w/ accuchecks  --cont farxiga   Cocaine abuse: urine drug screen was positive for cocaine. Cocaine use cessation counseling   Hypomagnesemia: now on oral Mg   Smoker: nicotine patch to prevent w/drawal.    Morbid obesity: BMI 44.8. Complicates overall care &  prognosis    Left arm swelling: pvl negative for dvt/stenosis. X-ray nothing acute. Possibly 2/2 positioning and edematous state, she says she does sleep on her left side. Improving with diuresis     DVT prophylaxis: heparin SQ but refuses this. Xarelto for DVT prophylaxis inpatient only  Code Status: full  Family Communication: none @ bedside Disposition Plan: likely d/c back home  Level of care: Stepdown Status is: Inpatient Remains inpatient appropriate because: remains on milrinone and lasix gtt    TOTAL TIME TAKING CARE OF THIS PATIENT: 35 minutes.  >50% time spent on counselling and coordination of care  Note: This dictation was prepared with Dragon dictation along with smaller phrase technology. Any transcriptional errors that result  from this process are unintentional.  [name] M.D    [facility]   CC: Primary care physician; Margarita Mail, DO

## 2022-11-10 NOTE — Inpatient Diabetes Management (Addendum)
Inpatient Diabetes Program Recommendations  AACE/ADA: New Consensus Statement on Inpatient Glycemic Control   Target Ranges:  Prepandial:   less than 140 mg/dL      Peak postprandial:   less than 180 mg/dL (1-2 hours)      Critically ill patients:  140 - 180 mg/dL    Latest Reference Range & Units 11/09/22 03:43 11/09/22 07:24 11/09/22 11:36 11/09/22 16:06 11/09/22 21:02 11/10/22 07:52  Glucose-Capillary 70 - 99 mg/dL 409 (H) 811 (H) 914 (H) 235 (H) 229 (H) 115 (H)   Review of Glycemic Control  Diabetes history: DM2 Outpatient Diabetes medications: Lantus 10 units QAM, Lantus 15 units QPM, Farxiga 10 mg daily Current orders for Inpatient glycemic control: Semglee 10 units at bedtime, Novolog 0-15 units TID with meals, Farxgia 10 mg daily  Inpatient Diabetes Program Recommendations:    Oral DM medications: Noted Farxiga 10 mg daily ordered to start today.  Discharge Recommendations: Other recommendations: Pt states she needs refills on Insulin Pen Needles Supply/Referral recommendations: Pen needles - standard   Thanks, Orlando Penner, RN, MSN, CDCES Diabetes Coordinator Inpatient Diabetes Program [phone] (Team Pager from 8am to 5pm)

## 2022-11-10 NOTE — Progress Notes (Signed)
Nutrition Follow-up  DOCUMENTATION CODES:   Obesity unspecified  INTERVENTION:   -Continue MVI with minerals daily  NUTRITION DIAGNOSIS:   Increased nutrient needs related to chronic illness (CHF) as evidenced by estimated needs.  Ongoing  GOAL:   Patient will meet greater than or equal to 90% of their needs  Progressing  MONITOR:   PO intake, Supplement acceptance, Labs, Weight trends, I & O's  REASON FOR ASSESSMENT:   Consult Other (Comment) (nutrition goals)  ASSESSMENT:   37 y.o. female with medical history significant for reduced ejection fraction heart failure, hypertension, diabetes mellitus, CKD, obesity and history of polysubstance abuse coming to [place] with complaints of swelling and fluid retention in arms and legs along with some central chest tightness and shortness of breath and all has been going on for about a week.  Reviewed I/O's: -3.6 L x 24 hours and -5.1 L since admission  UOP: 5.7 L x 24 hours  Per MD notes, ultrasound due to lt arm swelling did not reveal DVT.   Per Advanced Heart Failure Team notes, pt had not been taking medications for 2 weeks PTA. She is not a candidate for advanced therapies secondary to active cocaine use.   Pt with good appetite. Noted meal completions 100%. Noted pt has also been cosnuming outside food, such as potato chips.   Noted pt with a 17# wt gain since admission. Noted active diuresis.   Medications reviewed and include magnesium oxide, IV lasix, and aldactone.   Labs reviewed: Na: 132, CBGS: 115 (inpatient orders for glycemic control are 0-15 units insulin aspart TID with meals and 10 units insulin glargine-yfgn daily at bedtime).    Diet Order:   Diet Order             Diet heart healthy/carb modified Room service appropriate? Yes; Fluid consistency: Thin; Fluid restriction: 1500 mL Fluid  Diet effective now                   EDUCATION NEEDS:   Education needs have been addressed  Skin:  Skin  Assessment: Reviewed RN Assessment  Last BM:  11/10/22 (type 2)  Height:   Ht Readings from Last 1 Encounters:  11/08/22 5\' 7"  (1.702 m)    Weight:   Wt Readings from Last 1 Encounters:  11/10/22 127.7 kg    Ideal Body Weight:  61.4 kg  BMI:  Body mass index is 44.1 kg/m.  Estimated Nutritional Needs:   Kcal:  1850-2050  Protein:  90-105 grams  Fluid:  1.5 L    Levada Schilling, RD, LDN, CDCES Registered Dietitian II Certified Diabetes Care and Education Specialist Please refer to [facility] for RD and/or RD on-call/weekend/after hours pager

## 2022-11-10 NOTE — Progress Notes (Signed)
Patient ID: Molly Howard, female   DOB: [birth year], 37 y.o.   MRN: [id number]     Advanced Heart Failure Rounding Note  PCP-Cardiologist: Debbe Odea, MD   Subjective:    Co-ox 59% yesterday pm, not done yet today. Creatinine 1.25 => 1.23 => 1.13. I/Os net negative 3609 on milrinone 0.25 + Lasix gtt 12 mg/hr, weight down 6 lbs. CVP still > 20.   Large bag of chips in room.   Left arm swelling: ultrasound did not show DVT.    Objective:   Weight Range: 127.7 kg Body mass index is 44.1 kg/m.   Vital Signs:   Temp:  [97.8 F (36.6 C)-98.9 F (37.2 C)] 98.4 F (36.9 C) (07/10 0200) Pulse Rate:  [52-101] 52 (07/10 0618) Resp:  [0-28] 16 (07/10 0700) BP: (119-150)/(58-116) 132/58 (07/10 0618) SpO2:  [90 %-100 %] 100 % (07/10 0618) Weight:  [127.7 kg] 127.7 kg (07/10 0600) Last BM Date : 11/09/22  Weight change: Filed Weights   11/08/22 1751 11/09/22 0500 11/10/22 0600  Weight: (!) 136.2 kg 130.6 kg 127.7 kg    Intake/Output:   Intake/Output Summary (Last 24 hours) at 11/10/2022 0749 Last data filed at 11/10/2022 0700 Gross per 24 hour  Intake 2115.71 ml  Output 5725 ml  Net -3609.29 ml      Physical Exam    General: NAD Neck: JVP 16+, no thyromegaly or thyroid nodule.  Lungs: Clear to auscultation bilaterally with normal respiratory effort. CV: Nondisplaced PMI.  Heart regular S1/S2, +S3, 1/6 HSM apex.  1+ edema to abdomen  Abdomen: Soft, nontender, no hepatosplenomegaly, no distention.  Skin: Intact without lesions or rashes.  Neurologic: Alert and oriented x 3.  Psych: Normal affect. Extremities: No clubbing or cyanosis.  HEENT: Normal.   Telemetry   NSR 90s (personally reviewed)  Labs    CBC Recent Labs    11/08/22 0420  WBC 4.6  HGB 10.2*  HCT 33.8*  MCV 88.9  PLT 313   Basic Metabolic Panel Recent Labs    82/95/62 0420 11/09/22 0453 11/10/22 0451  NA 135 134* 132*  K 4.1 4.1 3.9  CL 98 97* 95*  CO2 27 28 29   GLUCOSE 184* 173*  285*  BUN 32* 37* 37*  CREATININE 1.25* 1.23* 1.13*  CALCIUM 6.8* 6.7* 6.8*  MG 1.7  --  1.6*   Liver Function Tests No results for input(s): "AST", "ALT", "ALKPHOS", "BILITOT", "PROT", "ALBUMIN" in the last 72 hours. No results for input(s): "LIPASE", "AMYLASE" in the last 72 hours. Cardiac Enzymes No results for input(s): "CKTOTAL", "CKMB", "CKMBINDEX", "TROPONINI" in the last 72 hours.  BNP: BNP (last 3 results) Recent Labs    02/09/22 1214 07/24/22 1619 11/05/22 1953  BNP 859.3* 1,400.4* 1,267.5*    ProBNP (last 3 results) No results for input(s): "PROBNP" in the last 8760 hours.   D-Dimer No results for input(s): "DDIMER" in the last 72 hours. Hemoglobin A1C No results for input(s): "HGBA1C" in the last 72 hours. Fasting Lipid Panel No results for input(s): "CHOL", "HDL", "LDLCALC", "TRIG", "CHOLHDL", "LDLDIRECT" in the last 72 hours. Thyroid Function Tests No results for input(s): "TSH", "T4TOTAL", "T3FREE", "THYROIDAB" in the last 72 hours.  Invalid input(s): "FREET3"  Other results:   Imaging    No results found.   Medications:     Scheduled Medications:  atorvastatin  20 mg Oral Daily   carvedilol  3.125 mg Oral BID WC   Chlorhexidine Gluconate Cloth  6 each Topical Daily  digoxin  0.125 mg Oral Daily   insulin aspart  0-15 Units Subcutaneous TID WC   insulin glargine-yfgn  10 Units Subcutaneous QHS   magnesium oxide  800 mg Oral Daily   multivitamin with minerals  1 tablet Oral Daily   pantoprazole  40 mg Oral BID   potassium chloride  40 mEq Oral Once   rivaroxaban  10 mg Oral Daily   sacubitril-valsartan  1 tablet Oral BID   sodium chloride flush  10-40 mL Intracatheter Q12H   sodium chloride flush  3 mL Intravenous Q12H   spironolactone  12.5 mg Oral Daily    Infusions:  ferric gluconate (FERRLECIT) IVPB Stopped (11/09/22 1156)   furosemide (LASIX) 200 mg in dextrose 5 % 100 mL (2 mg/mL) infusion 12 mg/hr (11/10/22 0700)   magnesium  sulfate bolus IVPB     Followed by   magnesium sulfate bolus IVPB     milrinone 0.25 mcg/kg/min (11/10/22 0700)    PRN Medications: acetaminophen **OR** acetaminophen, guaiFENesin, hydrALAZINE, nitroGLYCERIN, ondansetron **OR** ondansetron (ZOFRAN) IV, mouth rinse, sodium chloride flush    Assessment/Plan   1. 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 Cardiac MRI in 1/23 showed no delayed enhancement, so no evidence for prior MI, infiltrative disease, or myocarditis. RHC in 3/24 showed severely elevated R>L heart filling pressures with low PAPi and CI 2.  Worrisome situation as she has quite severe RV failure. Most recent echo in 3/24 showed EF 25-30%, mildly decreased RV systolic function, PASP 50 mmHg, moderate-severe TR, mild MR.  She has not followed up with cardiology and ran out of all meds > 2 wks ago.  Still positive for cocaine on UDS.  Lactate elevated at 2, she was started on milrinone 0.25 + Lasix gtt 12 mg/hr for marked volume overload.  Co-ox 59% last night.  CVP still > 20 but weight down 6 lbs and diuresing well. She is still markedly volume overloaded.  - Send co-ox this morning.   - Continue milrinone 0.25 mcg/kg/min. - Continue Lasix gtt at 12 mg/hr.  - OK to continue low dose Coreg 3.125 mg bid for now.  - Continue digoxin 0.125 daily.  - Continue Entresto 24/26 bid.  - Add spironolactone 12.5 daily.  - Add Farxiga 10 mg daily.  - With active cocaine use, not candidate for advanced therapies or ICD.  Narrow QRS so would not benefit from CRT.  - Discussed importance of sodium limitation long-term.  2. Cocaine abuse: UDS positive again.  Urged her to quit.  3. Anemia: H/o menorrhagia. Fe deficient.  - Has had IV Fe.  4. Tricuspid regurgitation: Moderate-severe last echo in setting of RV failure. RV failure will make volume difficult to manage.  5. Type 2 diabetes: Poor control, hgbA1c 11.7.  - Per primary.    Length of Stay:  4  Marca Ancona, MD  11/10/2022, 7:49 AM  Advanced Heart Failure Team Pager [phone] (M-F; 7a - 5p)  Please contact [facility] for night-coverage after hours (5p -7a ) and weekends on [email or web address]

## 2022-11-11 DIAGNOSIS — I5043 Acute on chronic combined systolic (congestive) and diastolic (congestive) heart failure: Secondary | ICD-10-CM | POA: Diagnosis not present

## 2022-11-11 LAB — BASIC METABOLIC PANEL
Anion gap: 7 (ref 5–15)
BUN: 35 mg/dL — ABNORMAL HIGH (ref 6–20)
CO2: 33 mmol/L — ABNORMAL HIGH (ref 22–32)
Calcium: 7.5 mg/dL — ABNORMAL LOW (ref 8.9–10.3)
Chloride: 99 mmol/L (ref 98–111)
Creatinine, Ser: 1.2 mg/dL — ABNORMAL HIGH (ref 0.44–1.00)
GFR, Estimated: 59 mL/min — ABNORMAL LOW (ref >60)
Glucose, Bld: 169 mg/dL — ABNORMAL HIGH (ref 70–99)
Potassium: 3.9 mmol/L (ref 3.5–5.1)
Sodium: 137 mmol/L (ref 135–145)

## 2022-11-11 LAB — CARBOXYHEMOGLOBIN - COOX: Carboxyhemoglobin: 2.1 % — ABNORMAL HIGH (ref 0.5–1.5)

## 2022-11-11 LAB — GLUCOSE, CAPILLARY
Glucose-Capillary: 116 mg/dL — ABNORMAL HIGH (ref 70–99)
Glucose-Capillary: 171 mg/dL — ABNORMAL HIGH (ref 70–99)
Glucose-Capillary: 170 mg/dL — ABNORMAL HIGH (ref 70–99)
Glucose-Capillary: 264 mg/dL — ABNORMAL HIGH (ref 70–99)

## 2022-11-11 LAB — COOXEMETRY PANEL
Carboxyhemoglobin: 2.3 % — ABNORMAL HIGH (ref 0.5–1.5)
Methemoglobin: 0.7 % (ref 0.0–1.5)
O2 Saturation: 69.6 %
Total hemoglobin: 10.4 g/dL — ABNORMAL LOW (ref 12.0–16.0)
Total oxygen content: 67.6 %

## 2022-11-11 LAB — MAGNESIUM: Magnesium: 2 mg/dL (ref 1.7–2.4)

## 2022-11-11 MED ORDER — POTASSIUM CHLORIDE CRYS ER 20 MEQ PO TBCR
40.0000 meq | EXTENDED_RELEASE_TABLET | Freq: Two times a day (BID) | ORAL | Status: AC
  Administered 2022-11-11 (×2): 40 meq via ORAL
  Filled 2022-11-11 (×2): qty 2

## 2022-11-11 MED ORDER — SPIRONOLACTONE 25 MG PO TABS
25.0000 mg | ORAL_TABLET | Freq: Every day | ORAL | Status: DC
  Administered 2022-11-11 – 2022-11-14 (×4): 25 mg via ORAL
  Filled 2022-11-11 (×3): qty 1

## 2022-11-11 MED ORDER — POTASSIUM CHLORIDE CRYS ER 20 MEQ PO TBCR: 40.0000 meq | EXTENDED_RELEASE_TABLET | Freq: Once | ORAL | Status: DC

## 2022-11-11 MED ORDER — ACETAZOLAMIDE 250 MG PO TABS
250.0000 mg | ORAL_TABLET | Freq: Once | ORAL | Status: AC
  Administered 2022-11-11: 250 mg via ORAL
  Filled 2022-11-11: qty 1

## 2022-11-11 MED ORDER — ALTEPLASE 2 MG IJ SOLR
2.0000 mg | Freq: Once | INTRAMUSCULAR | Status: AC
  Administered 2022-11-11: 2 mg

## 2022-11-11 NOTE — TOC Progression Note (Signed)
Transition of Care [facility]) - Progression Note    Patient Details  Name: Molly Howard MRN: [id number] Date of Birth: 03-Jan-1986  Transition of Care [facility]) CM/SW Contact  Kreg Shropshire, RN Phone Number: 11/11/2022, 10:55 AM  Clinical Narrative:    Cm still following for toc needs. No needs at this time.   Expected Discharge Plan: Home/Self Care Barriers to Discharge: Continued Medical Work up  Expected Discharge Plan and Services     Post Acute Care Choice: NA Living arrangements for the past 2 months: Single Family Home                                       Social Determinants of Health (SDOH) Interventions SDOH Screenings   Food Insecurity: No Food Insecurity (11/06/2022)  Housing: Low Risk  (11/06/2022)  Transportation Needs: No Transportation Needs (11/06/2022)  Utilities: Not At Risk (11/06/2022)  Depression (PHQ2-9): Low Risk  (10/04/2022)  Financial Resource Strain: High Risk (06/16/2020)  Tobacco Use: Medium Risk (11/05/2022)    Readmission Risk Interventions    11/08/2022    3:23 PM 07/25/2022   10:35 AM 02/15/2022    2:34 PM  Readmission Risk Prevention Plan  Transportation Screening Complete Complete Complete  PCP or Specialist Appt within 3-5 Days  Complete Complete  Social Work Consult for Recovery Care Planning/Counseling  Complete Complete  Palliative Care Screening  Not Applicable Not Applicable  Medication Review Oceanographer) Complete Complete Complete  PCP or Specialist appointment within 3-5 days of discharge Complete    SW Recovery Care/Counseling Consult Complete    Palliative Care Screening Not Applicable    Skilled Nursing Facility Not Applicable

## 2022-11-11 NOTE — Progress Notes (Signed)
[facility]  - [place] at [facility]   PATIENT NAME: Molly Howard    MR#:  [id number]  DATE OF BIRTH:  August 01, 1985  SUBJECTIVE:   No family at bedside. Patient sitting up in the chair. On room air. Vitals stable. On IV milrinone gtt and Lasix Gtt  UOP >9.4 liter yday CVP 15 today VITALS:  Blood pressure (!) 143/91, pulse (!) 101, temperature 97.9 F (36.6 C), temperature source Oral, resp. rate 17, height 5\' 7"  (1.702 m), weight 125.4 kg, SpO2 96%.  PHYSICAL EXAMINATION:   GENERAL:  37 y.o.-year-old patient with no acute distress. Obese LUNGS: Normal breath sounds bilaterally, no wheezing CARDIOVASCULAR: S1, S2 normal. No murmur   ABDOMEN: Soft, nontender, nondistended.abd obesity Bowel sounds present.  EXTREMITIES: ++ edema b/l.    NEUROLOGIC: nonfocal  patient is alert and awake   LABORATORY PANEL:  CBC Recent Labs  Lab 11/08/22 0420  WBC 4.6  HGB 10.2*  HCT 33.8*  PLT 313    Chemistries  Recent Labs  Lab 11/06/22 0223 11/07/22 0440 11/11/22 0400  NA 135   < > 137  K 4.1   < > 3.9  CL 99   < > 99  CO2 26   < > 33*  GLUCOSE 262*   < > 169*  BUN 23*   < > 35*  CREATININE 1.11*   < > 1.20*  CALCIUM 6.5*   < > 7.5*  MG 1.4*   < > 2.0  AST 18  --   --   ALT 11  --   --   ALKPHOS 167*  --   --   BILITOT 0.7  --   --    < > = values in this interval not displayed.   Cardiac Enzymes No results for input(s): "TROPONINI" in the last 168 hours. RADIOLOGY:  No results found.  Assessment and Plan  Aliana L Pacholski is a 37 y.o. female with medical history significant for reduced ejection fraction heart failure, hypertension, diabetes mellitus, CKD, obesity and history of polysubstance abuse coming to [place] with complaints of swelling and fluid retention in arms and legs along with some central chest tightness and shortness of breath and all has been going on for about a week.   Acute on chronic systolic CHF exacerbation with biventricular  failure: Noncompliance to diet/Meds and f/u with cardiology -- reports dry weight of around 230 pounds. Edema to abdomen.  --Has not diuresed well here so advanced heart failure team consulted on 7/8.  --Now in step-down with picc, on lasix gtt with milrinone and diuresing well, UOP 9.4l iters yesterday. --Has not f/u outpatient w/ cardio in at least 1 year secondary to not having transportation as per pt. --Most recent echo in 3/24 showed EF 25-30%, mildly decreased RV systolic function, PASP 50 mmHg, moderate-severe TR, mild MR.    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 H&H are stable  Received IV ferrilecit--(per cardiology orders)   HTN: continue on home dose of coreg. Cardiology has re-started entresto   DM2: poorly controlled, HbA1c 11.7. Continue on glargine, SSI w/ accuchecks  --cont farxiga   Cocaine abuse: urine drug screen was positive for cocaine. Cocaine use cessation counseling   Hypomagnesemia: now on oral Mg   Smoker: nicotine patch to prevent w/drawal.    Morbid obesity: BMI 44.8. Complicates overall care & prognosis    Left arm swelling: pvl negative for dvt/stenosis. X-ray nothing acute. Possibly 2/2 positioning and edematous state, she says she  does sleep on her left side. Improving with diuresis     DVT prophylaxis: heparin SQ but refuses this. Xarelto for DVT prophylaxis inpatient only  Code Status: full  Family Communication: none @ bedside Disposition Plan: likely d/c back home  Level of care: Stepdown Status is: Inpatient Remains inpatient appropriate because: remains on milrinone and lasix gtt    TOTAL TIME TAKING CARE OF THIS PATIENT: 35 minutes.  >50% time spent on counselling and coordination of care  Note: This dictation was prepared with Dragon dictation along with smaller phrase technology. Any transcriptional errors that result from this process are unintentional.  [name] M.D    [facility]   CC: Primary care physician; Margarita Mail, DO

## 2022-11-11 NOTE — Progress Notes (Signed)
Patient ID: Molly Howard, female   DOB: 20-Jul-1985, 37 y.o.   MRN: [id number]     Advanced Heart Failure Rounding Note  PCP-Cardiologist: Debbe Odea, MD   Subjective:    Co-ox 70%. Creatinine 1.25 => 1.23 => 1.13 => 1.2. I/Os net negative 8035 on milrinone 0.25 + Lasix gtt 12 mg/hr, weight down 5 lbs. CVP coming down, now 15.    Left arm swelling: ultrasound did not show DVT.    Objective:   Weight Range: 125.4 kg Body mass index is 43.3 kg/m.   Vital Signs:   Temp:  [98.3 F (36.8 C)-98.6 F (37 C)] 98.3 F (36.8 C) (07/11 0200) Pulse Rate:  [88-100] 96 (07/11 0600) Resp:  [0-25] 17 (07/11 0600) BP: (131-155)/(76-98) 131/76 (07/11 0324) SpO2:  [95 %-98 %] 96 % (07/11 0200) Weight:  [125.4 kg] 125.4 kg (07/11 0245) Last BM Date : 11/10/22  Weight change: Filed Weights   11/09/22 0500 11/10/22 0600 11/11/22 0245  Weight: 130.6 kg 127.7 kg 125.4 kg    Intake/Output:   Intake/Output Summary (Last 24 hours) at 11/11/2022 0747 Last data filed at 11/11/2022 0732 Gross per 24 hour  Intake 1345.25 ml  Output 9850 ml  Net -8504.75 ml      Physical Exam    General: NAD Neck: JVP 14, no thyromegaly or thyroid nodule.  Lungs: Clear to auscultation bilaterally with normal respiratory effort. CV: Lateral PMI.  Heart regular S1/S2, +S3, 2/6 HSM LLSB/apex.  1+ edema to knees.  Abdomen: Soft, nontender, no hepatosplenomegaly, no distention.  Skin: Intact without lesions or rashes.  Neurologic: Alert and oriented x 3.  Psych: Normal affect. Extremities: No clubbing or cyanosis.  HEENT: Normal.   Telemetry   NSR 90s (personally reviewed)  Labs    CBC No results for input(s): "WBC", "NEUTROABS", "HGB", "HCT", "MCV", "PLT" in the last 72 hours.  Basic Metabolic Panel Recent Labs    89/38/10 0451 11/11/22 0400  NA 132* 137  K 3.9 3.9  CL 95* 99  CO2 29 33*  GLUCOSE 285* 169*  BUN 37* 35*  CREATININE 1.13* 1.20*  CALCIUM 6.8* 7.5*  MG 1.6* 2.0    Liver Function Tests No results for input(s): "AST", "ALT", "ALKPHOS", "BILITOT", "PROT", "ALBUMIN" in the last 72 hours. No results for input(s): "LIPASE", "AMYLASE" in the last 72 hours. Cardiac Enzymes No results for input(s): "CKTOTAL", "CKMB", "CKMBINDEX", "TROPONINI" in the last 72 hours.  BNP: BNP (last 3 results) Recent Labs    02/09/22 1214 07/24/22 1619 11/05/22 1953  BNP 859.3* 1,400.4* 1,267.5*    ProBNP (last 3 results) No results for input(s): "PROBNP" in the last 8760 hours.   D-Dimer No results for input(s): "DDIMER" in the last 72 hours. Hemoglobin A1C No results for input(s): "HGBA1C" in the last 72 hours. Fasting Lipid Panel No results for input(s): "CHOL", "HDL", "LDLCALC", "TRIG", "CHOLHDL", "LDLDIRECT" in the last 72 hours. Thyroid Function Tests No results for input(s): "TSH", "T4TOTAL", "T3FREE", "THYROIDAB" in the last 72 hours.  Invalid input(s): "FREET3"  Other results:   Imaging    No results found.   Medications:     Scheduled Medications:  atorvastatin  20 mg Oral Daily   carvedilol  3.125 mg Oral BID WC   Chlorhexidine Gluconate Cloth  6 each Topical Daily   dapagliflozin propanediol  10 mg Oral Daily   digoxin  0.125 mg Oral Daily   insulin aspart  0-15 Units Subcutaneous TID WC   insulin glargine-yfgn  12  Units Subcutaneous QHS   magnesium oxide  800 mg Oral Daily   multivitamin with minerals  1 tablet Oral Daily   potassium chloride  40 mEq Oral Once   rivaroxaban  10 mg Oral Daily   sacubitril-valsartan  1 tablet Oral BID   sodium chloride flush  10-40 mL Intracatheter Q12H   sodium chloride flush  3 mL Intravenous Q12H   spironolactone  25 mg Oral Daily    Infusions:  furosemide (LASIX) 200 mg in dextrose 5 % 100 mL (2 mg/mL) infusion 12 mg/hr (11/11/22 0200)   milrinone 0.25 mcg/kg/min (11/11/22 0506)    PRN Medications: acetaminophen **OR** acetaminophen, guaiFENesin, hydrALAZINE, nitroGLYCERIN, ondansetron  **OR** ondansetron (ZOFRAN) IV, mouth rinse, sodium chloride flush    Assessment/Plan   1. 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 Cardiac MRI in 1/23 showed no delayed enhancement, so no evidence for prior MI, infiltrative disease, or myocarditis. RHC in 3/24 showed severely elevated R>L heart filling pressures with low PAPi and CI 2.  Worrisome situation as she has quite severe RV failure. Most recent echo in 3/24 showed EF 25-30%, mildly decreased RV systolic function, PASP 50 mmHg, moderate-severe TR, mild MR.  She has not followed up with cardiology and ran out of all meds > 2 wks ago.  Still positive for cocaine on UDS.  Lactate elevated at 2, she was started on milrinone 0.25 + Lasix gtt 12 mg/hr for marked volume overload.  Co-ox 70%.  Weight coming down, CVP 15 today.  Still volume overloaded.  - Continue milrinone 0.25 mcg/kg/min. - Continue Lasix gtt at 12 mg/hr.  - Give 1 dose acetazolamide 250 with rising HCO3.  - OK to continue low dose Coreg 3.125 mg bid for now.  - Continue digoxin 0.125 daily.  - Continue Entresto 24/26 bid.  - Increase spironolactone to 25 mg daily.  - Continue Farxiga 10 mg daily.  - With active cocaine use, not candidate for advanced therapies or ICD.  Narrow QRS so would not benefit from CRT.  - Discussed importance of sodium limitation long-term.  2. Cocaine abuse: UDS positive again.  Urged her to quit.  3. Anemia: H/o menorrhagia. Fe deficient.  - Has had IV Fe.  4. Tricuspid regurgitation: Moderate-severe last echo in setting of RV failure. RV failure will make volume difficult to manage.  5. Type 2 diabetes: Poor control, hgbA1c 11.7.  - Per primary.    Length of Stay: 5  Marca Ancona, MD  11/11/2022, 7:47 AM  Advanced Heart Failure Team Pager [phone] (M-F; 7a - 5p)  Please contact [facility] for night-coverage after hours (5p -7a ) and weekends on [email or web address]

## 2022-11-11 NOTE — Progress Notes (Signed)
0800- Pt refuses to keep bp cuff and o2 monitor on.  Pt is resting in bed watching tv. Pts v/s are stable at this time. Pt has no c/o pain or discomfort noted or stated.

## 2022-11-12 DIAGNOSIS — I5043 Acute on chronic combined systolic (congestive) and diastolic (congestive) heart failure: Secondary | ICD-10-CM | POA: Diagnosis not present

## 2022-11-12 LAB — COOXEMETRY PANEL
Carboxyhemoglobin: 2.5 % — ABNORMAL HIGH (ref 0.5–1.5)
Carboxyhemoglobin: 2 % — ABNORMAL HIGH (ref 0.5–1.5)
Methemoglobin: 0.8 % (ref 0.0–1.5)
Methemoglobin: 1.3 % (ref 0.0–1.5)
O2 Saturation: 54.7 %
O2 Saturation: 64.5 %
Total hemoglobin: 10.5 g/dL — ABNORMAL LOW (ref 12.0–16.0)
Total hemoglobin: 11.2 g/dL — ABNORMAL LOW (ref 12.0–16.0)
Total oxygen content: 52.8 %
Total oxygen content: 62.4 %

## 2022-11-12 LAB — CARBOXYHEMOGLOBIN - COOX: Carboxyhemoglobin: 2.1 % — ABNORMAL HIGH (ref 0.5–1.5)

## 2022-11-12 LAB — GLUCOSE, CAPILLARY
Glucose-Capillary: 160 mg/dL — ABNORMAL HIGH (ref 70–99)
Glucose-Capillary: 176 mg/dL — ABNORMAL HIGH (ref 70–99)
Glucose-Capillary: 149 mg/dL — ABNORMAL HIGH (ref 70–99)
Glucose-Capillary: 210 mg/dL — ABNORMAL HIGH (ref 70–99)

## 2022-11-12 LAB — BASIC METABOLIC PANEL
Anion gap: 8 (ref 5–15)
BUN: 32 mg/dL — ABNORMAL HIGH (ref 6–20)
CO2: 31 mmol/L (ref 22–32)
Calcium: 7.5 mg/dL — ABNORMAL LOW (ref 8.9–10.3)
Chloride: 100 mmol/L (ref 98–111)
Creatinine, Ser: 1.11 mg/dL — ABNORMAL HIGH (ref 0.44–1.00)
GFR, Estimated: >60 mL/min (ref >60)
Glucose, Bld: 164 mg/dL — ABNORMAL HIGH (ref 70–99)
Potassium: 3.9 mmol/L (ref 3.5–5.1)
Sodium: 139 mmol/L (ref 135–145)

## 2022-11-12 LAB — MAGNESIUM: Magnesium: 1.9 mg/dL (ref 1.7–2.4)

## 2022-11-12 MED ORDER — ACETAZOLAMIDE 250 MG PO TABS
250.0000 mg | ORAL_TABLET | Freq: Once | ORAL | Status: AC
  Administered 2022-11-12: 250 mg via ORAL
  Filled 2022-11-12: qty 1

## 2022-11-12 MED ORDER — POTASSIUM CHLORIDE CRYS ER 20 MEQ PO TBCR
40.0000 meq | EXTENDED_RELEASE_TABLET | Freq: Two times a day (BID) | ORAL | Status: DC
  Administered 2022-11-12 – 2022-11-14 (×5): 40 meq via ORAL
  Filled 2022-11-12 (×5): qty 2

## 2022-11-12 MED ORDER — INSULIN GLARGINE-YFGN 100 UNIT/ML ~~LOC~~ SOLN
15.0000 [IU] | Freq: Every day | SUBCUTANEOUS | Status: DC
Start: 2022-11-12 — End: 2022-11-12

## 2022-11-12 MED ORDER — SACUBITRIL-VALSARTAN 49-51 MG PO TABS
1.0000 | ORAL_TABLET | Freq: Two times a day (BID) | ORAL | Status: DC
  Administered 2022-11-12 – 2022-11-13 (×4): 1 via ORAL
  Filled 2022-11-12 (×5): qty 1

## 2022-11-12 MED ORDER — INSULIN GLARGINE-YFGN 100 UNIT/ML ~~LOC~~ SOLN
12.0000 [IU] | Freq: Two times a day (BID) | SUBCUTANEOUS | Status: DC
  Administered 2022-11-12 – 2022-11-14 (×5): 12 [IU] via SUBCUTANEOUS
  Filled 2022-11-12 (×5): qty 0.12

## 2022-11-12 NOTE — Plan of Care (Signed)
  Problem: Education: Goal: Ability to demonstrate management of disease process will improve Outcome: Progressing Goal: Ability to verbalize understanding of medication therapies will improve Outcome: Progressing Goal: Individualized Educational Video(s) Outcome: Progressing   Problem: Activity: Goal: Capacity to carry out activities will improve Outcome: Progressing   Problem: Cardiac: Goal: Ability to achieve and maintain adequate cardiopulmonary perfusion will improve Outcome: Progressing   Problem: Education: Goal: Ability to describe self-care measures that may prevent or decrease complications (Diabetes Survival Skills Education) will improve Outcome: Progressing   Problem: Coping: Goal: Ability to adjust to condition or change in health will improve Outcome: Progressing   Problem: Fluid Volume: Goal: Ability to maintain a balanced intake and output will improve Outcome: Progressing   Problem: Metabolic: Goal: Ability to maintain appropriate glucose levels will improve Outcome: Progressing   Problem: Tissue Perfusion: Goal: Adequacy of tissue perfusion will improve Outcome: Progressing   Problem: Education: Goal: Ability to demonstrate management of disease process will improve Outcome: Progressing

## 2022-11-12 NOTE — Progress Notes (Signed)
[facility]  - [place] at [facility]   PATIENT NAME: Molly Howard    MR#:  [id number]  DATE OF BIRTH:  [birth year]  SUBJECTIVE:   No family at bedside. Patient sitting up in the chair. On room air. Vitals stable. On IV milrinone gtt and Lasix Gtt  UOP >8.4 liter yday CVP 17 today VITALS:  Blood pressure 123/72, pulse 98, temperature 98.4 F (36.9 C), temperature source Axillary, resp. rate 20, height 5\' 7"  (1.702 m), weight 110.8 kg, SpO2 92%.  PHYSICAL EXAMINATION:   GENERAL:  37 y.o.-year-old patient with no acute distress. Obese LUNGS: Normal breath sounds bilaterally, no wheezing CARDIOVASCULAR: S1, S2 normal. No murmur   ABDOMEN: Soft, nontender, nondistended.abd obesity Bowel sounds present.  EXTREMITIES: ++ edema b/l.    NEUROLOGIC: nonfocal  patient is alert and awake   LABORATORY PANEL:  CBC Recent Labs  Lab 11/08/22 0420  WBC 4.6  HGB 10.2*  HCT 33.8*  PLT 313    Chemistries  Recent Labs  Lab 11/06/22 0223 11/07/22 0440 11/12/22 0555  NA 135   < > 139  K 4.1   < > 3.9  CL 99   < > 100  CO2 26   < > 31  GLUCOSE 262*   < > 164*  BUN 23*   < > 32*  CREATININE 1.11*   < > 1.11*  CALCIUM 6.5*   < > 7.5*  MG 1.4*   < > 1.9  AST 18  --   --   ALT 11  --   --   ALKPHOS 167*  --   --   BILITOT 0.7  --   --    < > = values in this interval not displayed.   Cardiac Enzymes No results for input(s): "TROPONINI" in the last 168 hours. RADIOLOGY:  No results found.  Assessment and Plan  Molly Howard is a 37 y.o. female with medical history significant for reduced ejection fraction heart failure, hypertension, diabetes mellitus, CKD, obesity and history of polysubstance abuse coming to [place] with complaints of swelling and fluid retention in arms and legs along with some central chest tightness and shortness of breath and all has been going on for about a week.   Acute on chronic systolic CHF exacerbation with biventricular  failure: Noncompliance to diet/Meds and f/u with cardiology -- reports dry weight of around 230 pounds. Edema to abdomen.  --Has not diuresed well here so advanced heart failure team consulted on 7/8.  --Now in step-down with picc, on lasix gtt with milrinone and diuresing well, --Total UOP >27 liters. Weight significantly down --Most recent echo in 3/24 showed EF 25-30%, mildly decreased RV systolic function, PASP 50 mmHg, moderate-severe TR, mild MR.    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 H&H are stable  Received IV ferrilecit--(per cardiology orders)   HTN: continue on home dose of coreg. Cardiology has re-started entresto   DM2: poorly controlled, HbA1c 11.7. Continue on glargine, SSI w/ accuchecks  --cont farxiga   Cocaine abuse: urine drug screen was positive for cocaine. Cocaine use cessation counseling   Hypomagnesemia: now on oral Mg   Smoker: nicotine patch to prevent w/drawal.    Morbid obesity: BMI 44.8. Complicates overall care & prognosis    Left arm swelling: pvl negative for dvt/stenosis. X-ray nothing acute. Possibly 2/2 positioning and edematous state, she says she does sleep on her left side. Improving with diuresis     DVT prophylaxis: heparin SQ but  refuses this. Xarelto for DVT prophylaxis inpatient only  Code Status: full  Family Communication: none @ bedside Disposition Plan: likely d/c back home  Level of care: Stepdown Status is: Inpatient Remains inpatient appropriate because: remains on milrinone and lasix gtt    TOTAL TIME TAKING CARE OF THIS PATIENT: 35 minutes.  >50% time spent on counselling and coordination of care  Note: This dictation was prepared with Dragon dictation along with smaller phrase technology. Any transcriptional errors that result from this process are unintentional.  [name] M.D    [facility]   CC: Primary care physician; Margarita Mail, DO

## 2022-11-12 NOTE — Progress Notes (Addendum)
Patient ID: Molly Howard, female   DOB: Oct 17, 1985, 37 y.o.   MRN: [id number]     Advanced Heart Failure Rounding Note  PCP-Cardiologist: Debbe Odea, MD   Subjective:    Co-ox 55%. Creatinine 1.25 => 1.23 => 1.13 => 1.2 => 1.11. I/Os net negative 7672 on milrinone 0.25 + Lasix gtt 12 mg/hr, weight down 11 lbs. CVP still 17.     Left arm swelling: ultrasound did not show DVT.    Objective:   Weight Range: 120.4 kg Body mass index is 41.57 kg/m.   Vital Signs:   Temp:  [98.4 F (36.9 C)-99 F (37.2 C)] 98.4 F (36.9 C) (07/12 0735) Pulse Rate:  [90-101] 95 (07/12 0735) Resp:  [19-21] 20 (07/11 1605) BP: (134-143)/(77-97) 140/77 (07/12 0735) SpO2:  [89 %-94 %] 93 % (07/12 0735) Weight:  [120.4 kg] 120.4 kg (07/12 0500) Last BM Date : 11/05/22  Weight change: Filed Weights   11/10/22 0600 11/11/22 0245 11/12/22 0500  Weight: 127.7 kg 125.4 kg 120.4 kg    Intake/Output:   Intake/Output Summary (Last 24 hours) at 11/12/2022 0754 Last data filed at 11/12/2022 0700 Gross per 24 hour  Intake 1277.48 ml  Output 8500 ml  Net -7222.52 ml      Physical Exam    General: NAD Neck: JVP 14-16, no thyromegaly or thyroid nodule.  Lungs: Clear to auscultation bilaterally with normal respiratory effort. CV: Lateral PMI.  Heart regular S1/S2, +S3, 1/6 HSM LLSB.  1+ edema to knees.  Abdomen: Soft, nontender, no hepatosplenomegaly, no distention.  Skin: Intact without lesions or rashes.  Neurologic: Alert and oriented x 3.  Psych: Normal affect. Extremities: No clubbing or cyanosis.  HEENT: Normal.    Telemetry   NSR 90s (personally reviewed)  Labs    CBC No results for input(s): "WBC", "NEUTROABS", "HGB", "HCT", "MCV", "PLT" in the last 72 hours.  Basic Metabolic Panel Recent Labs    11/91/47 0400 11/12/22 0555  NA 137 139  K 3.9 3.9  CL 99 100  CO2 33* 31  GLUCOSE 169* 164*  BUN 35* 32*  CREATININE 1.20* 1.11*  CALCIUM 7.5* 7.5*  MG 2.0 1.9   Liver  Function Tests No results for input(s): "AST", "ALT", "ALKPHOS", "BILITOT", "PROT", "ALBUMIN" in the last 72 hours. No results for input(s): "LIPASE", "AMYLASE" in the last 72 hours. Cardiac Enzymes No results for input(s): "CKTOTAL", "CKMB", "CKMBINDEX", "TROPONINI" in the last 72 hours.  BNP: BNP (last 3 results) Recent Labs    02/09/22 1214 07/24/22 1619 11/05/22 1953  BNP 859.3* 1,400.4* 1,267.5*    ProBNP (last 3 results) No results for input(s): "PROBNP" in the last 8760 hours.   D-Dimer No results for input(s): "DDIMER" in the last 72 hours. Hemoglobin A1C No results for input(s): "HGBA1C" in the last 72 hours. Fasting Lipid Panel No results for input(s): "CHOL", "HDL", "LDLCALC", "TRIG", "CHOLHDL", "LDLDIRECT" in the last 72 hours. Thyroid Function Tests No results for input(s): "TSH", "T4TOTAL", "T3FREE", "THYROIDAB" in the last 72 hours.  Invalid input(s): "FREET3"  Other results:   Imaging    No results found.   Medications:     Scheduled Medications:  acetaZOLAMIDE  250 mg Oral Once   atorvastatin  20 mg Oral Daily   carvedilol  3.125 mg Oral BID WC   Chlorhexidine Gluconate Cloth  6 each Topical Daily   dapagliflozin propanediol  10 mg Oral Daily   digoxin  0.125 mg Oral Daily   insulin aspart  0-15  Units Subcutaneous TID WC   insulin glargine-yfgn  12 Units Subcutaneous QHS   magnesium oxide  800 mg Oral Daily   multivitamin with minerals  1 tablet Oral Daily   potassium chloride  40 mEq Oral BID   rivaroxaban  10 mg Oral Daily   sacubitril-valsartan  1 tablet Oral BID   sodium chloride flush  10-40 mL Intracatheter Q12H   sodium chloride flush  3 mL Intravenous Q12H   spironolactone  25 mg Oral Daily    Infusions:  furosemide (LASIX) 200 mg in dextrose 5 % 100 mL (2 mg/mL) infusion 12 mg/hr (11/12/22 0700)   milrinone 0.25 mcg/kg/min (11/12/22 0745)    PRN Medications: acetaminophen **OR** acetaminophen, guaiFENesin, hydrALAZINE,  nitroGLYCERIN, ondansetron **OR** ondansetron (ZOFRAN) IV, mouth rinse, sodium chloride flush    Assessment/Plan   1. 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 Cardiac MRI in 1/23 showed no delayed enhancement, so no evidence for prior MI, infiltrative disease, or myocarditis. RHC in 3/24 showed severely elevated R>L heart filling pressures with low PAPi and CI 2.  Worrisome situation as she has quite severe RV failure. Most recent echo in 3/24 showed EF 25-30%, mildly decreased RV systolic function, PASP 50 mmHg, moderate-severe TR, mild MR.  She has not followed up with cardiology and ran out of all meds > 2 wks ago.  Still positive for cocaine on UDS.  Lactate elevated at 2, she was started on milrinone 0.25 + Lasix gtt 12 mg/hr for marked volume overload.  Co-ox 55%.  Weight coming down, CVP 17 today.  Still volume overloaded.  - Continue milrinone 0.25 mcg/kg/min.  Repeat co-ox as early am reading was a little low.  - Continue Lasix gtt at 12 mg/hr.  - Give 1 dose acetazolamide 250 again.  - OK to continue low dose Coreg 3.125 mg bid for now.  - Continue digoxin 0.125 daily.  - Increase Entresto to 49/51 bid.  - Continue spironolactone 25 mg daily.  - Continue Farxiga 10 mg daily.  - With active cocaine use, not candidate for advanced therapies or ICD.  Narrow QRS so would not benefit from CRT.  - Discussed importance of sodium limitation long-term.  2. Cocaine abuse: UDS positive again.  Urged her to quit.  3. Anemia: H/o menorrhagia. Fe deficient.  - Has had IV Fe.  4. Tricuspid regurgitation: Moderate-severe last echo in setting of RV failure. RV failure will make volume difficult to manage.  5. Type 2 diabetes: Poor control, hgbA1c 11.7.  - Per primary.    Length of Stay: 6  Marca Ancona, MD  11/12/2022, 7:54 AM  Advanced Heart Failure Team Pager [phone] (M-F; 7a - 5p)  Please contact [facility] for night-coverage after hours (5p  -7a ) and weekends on [email or web address]

## 2022-11-13 DIAGNOSIS — D649 Anemia, unspecified: Secondary | ICD-10-CM

## 2022-11-13 DIAGNOSIS — I5043 Acute on chronic combined systolic (congestive) and diastolic (congestive) heart failure: Secondary | ICD-10-CM | POA: Diagnosis not present

## 2022-11-13 DIAGNOSIS — R601 Generalized edema: Secondary | ICD-10-CM

## 2022-11-13 DIAGNOSIS — Z720 Tobacco use: Secondary | ICD-10-CM

## 2022-11-13 LAB — CARBOXYHEMOGLOBIN - COOX: Carboxyhemoglobin: 2.5 % — ABNORMAL HIGH (ref 0.5–1.5)

## 2022-11-13 LAB — COOXEMETRY PANEL
Carboxyhemoglobin: 2.7 % — ABNORMAL HIGH (ref 0.5–1.5)
Methemoglobin: 0.7 % (ref 0.0–1.5)
O2 Saturation: 74.8 %
Total hemoglobin: 11.5 g/dL — ABNORMAL LOW (ref 12.0–16.0)
Total oxygen content: 72.3 %

## 2022-11-13 LAB — BASIC METABOLIC PANEL
Anion gap: 11 (ref 5–15)
BUN: 39 mg/dL — ABNORMAL HIGH (ref 6–20)
CO2: 29 mmol/L (ref 22–32)
Calcium: 8.6 mg/dL — ABNORMAL LOW (ref 8.9–10.3)
Chloride: 94 mmol/L — ABNORMAL LOW (ref 98–111)
Creatinine, Ser: 1.35 mg/dL — ABNORMAL HIGH (ref 0.44–1.00)
GFR, Estimated: 52 mL/min — ABNORMAL LOW (ref >60)
Glucose, Bld: 220 mg/dL — ABNORMAL HIGH (ref 70–99)
Potassium: 4.8 mmol/L (ref 3.5–5.1)
Sodium: 134 mmol/L — ABNORMAL LOW (ref 135–145)

## 2022-11-13 LAB — MAGNESIUM: Magnesium: 2.2 mg/dL (ref 1.7–2.4)

## 2022-11-13 LAB — GLUCOSE, CAPILLARY
Glucose-Capillary: 167 mg/dL — ABNORMAL HIGH (ref 70–99)
Glucose-Capillary: 184 mg/dL — ABNORMAL HIGH (ref 70–99)
Glucose-Capillary: 235 mg/dL — ABNORMAL HIGH (ref 70–99)
Glucose-Capillary: 306 mg/dL — ABNORMAL HIGH (ref 70–99)

## 2022-11-13 MED ORDER — TORSEMIDE 20 MG PO TABS
40.0000 mg | ORAL_TABLET | Freq: Two times a day (BID) | ORAL | Status: DC
  Administered 2022-11-13 – 2022-11-15 (×5): 40 mg via ORAL
  Filled 2022-11-13 (×5): qty 2

## 2022-11-13 NOTE — Progress Notes (Addendum)
Pt. refuses to keep bp cuff and o2 sat on. Pt is alert and oriented. Pts v/s are stable at this time. Pt has no c/o or pain or discomfort noted or stated.

## 2022-11-13 NOTE — Plan of Care (Signed)
  Problem: Education: Goal: Ability to demonstrate management of disease process will improve Outcome: Progressing Goal: Ability to verbalize understanding of medication therapies will improve Outcome: Progressing   Problem: Activity: Goal: Capacity to carry out activities will improve Outcome: Progressing   Problem: Cardiac: Goal: Ability to achieve and maintain adequate cardiopulmonary perfusion will improve Outcome: Progressing   Problem: Education: Goal: Ability to describe self-care measures that may prevent or decrease complications (Diabetes Survival Skills Education) will improve Outcome: Progressing   Problem: Coping: Goal: Ability to adjust to condition or change in health will improve Outcome: Progressing   Problem: Fluid Volume: Goal: Ability to maintain a balanced intake and output will improve Outcome: Progressing   Problem: Metabolic: Goal: Ability to maintain appropriate glucose levels will improve Outcome: Progressing   Problem: Nutritional: Goal: Maintenance of adequate nutrition will improve Outcome: Progressing Goal: Progress toward achieving an optimal weight will improve Outcome: Progressing   Problem: Clinical Measurements: Goal: Ability to maintain clinical measurements within normal limits will improve Outcome: Progressing Goal: Will remain free from infection Outcome: Progressing Goal: Respiratory complications will improve Outcome: Progressing Goal: Cardiovascular complication will be avoided Outcome: Progressing   Problem: Activity: Goal: Risk for activity intolerance will decrease Outcome: Progressing   Problem: Nutrition: Goal: Adequate nutrition will be maintained Outcome: Progressing

## 2022-11-13 NOTE — Progress Notes (Signed)
Patient Name: Molly Howard Date of Encounter: 11/13/2022 [facility] Cardiologist: Debbe Odea, MD   Interval Summary  .    Feeling well.  Feels that she is back to her dry weight.  Swelling in her arms has improved as well.  Diuresed 11 L yesterday.  Vital Signs .    Vitals:   11/13/22 0500 11/13/22 0600 11/13/22 0701 11/13/22 0840  BP: 136/75 121/71 139/88 (!) 138/94  Pulse: 92 91  98  Resp: 18 18    Temp:   98.2 F (36.8 C)   TempSrc:   Oral   SpO2: (!) 86% 90%    Weight: 102 kg     Height:        Intake/Output Summary (Last 24 hours) at 11/13/2022 0937 Last data filed at 11/13/2022 0800 Gross per 24 hour  Intake 582.23 ml  Output 16109 ml  Net -11367.77 ml      11/13/2022    5:00 AM 11/12/2022    7:35 AM 11/12/2022    5:00 AM  Last 3 Weights  Weight (lbs) 224 lb 13.9 oz 244 lb 4.3 oz 265 lb 6.9 oz  Weight (kg) 102 kg 110.8 kg 120.4 kg      Telemetry/ECG    Sinus rhythm.  Short runs of NSVT.- Personally Reviewed  Physical Exam .    VS:  BP (!) 138/94   Pulse 98   Temp 98.2 F (36.8 C) (Oral)   Resp 18   Ht 5\' 7"  (1.702 m)   Wt 102 kg   SpO2 90%   BMI 35.22 kg/m  , BMI Body mass index is 35.22 kg/m. GENERAL:  Well appearing.  Lying flat in bed and resting comfortably. HEENT: Pupils equal round and reactive, fundi not visualized, oral mucosa unremarkable NECK:  No jugular venous distention, waveform within normal limits, carotid upstroke brisk and symmetric, no bruits, no thyromegaly LUNGS:  Clear to auscultation bilaterally HEART:  RRR.  PMI not displaced or sustained,S1 and S2 within normal limits, no S3, no S4, no clicks, no rubs, no murmurs ABD:  Flat, positive bowel sounds normal in frequency in pitch, no bruits, no rebound, no guarding, no midline pulsatile mass, no hepatomegaly, no splenomegaly EXT:  2 plus pulses throughout, no edema, no cyanosis no clubbing SKIN:  No rashes no nodules NEURO:  Cranial nerves II through XII  grossly intact, motor grossly intact throughout PSYCH:  Cognitively intact, oriented to person place and time   Assessment & Plan .     # Acute on chronic systolic and diastolic HF:  # Biventricular heart failure: LVEF 25-30% on most recent echo 07/2022.  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  No delayed enhancement on cardiac MRI.  Patient was out of her medications for 2 weeks prior to admission and has not had regular cardiology follow-up.  UDS was positive for cocaine.  She has been managed by the heart failure service and is on milrinone with a Lasix drip.  She is not a candidate for advanced therapies due to persistently positive tox screens for cocaine.  Yesterday she was net -11.2 L.  CVP this morning was 7.  Weight decreased from 110.8 kg to 102 kg.  She appears to be euvolemic.  She remains on Lasix drip at 12 mg/hr and milrinone 0.25 mcg/kg/min.  She is on good GDMT with carvedilol, Entresto, spironolactone, and [place].  Co.-ox improving.  It was up to 74.8 today from 64.5 yesterday.  Creatinine is starting to increase.  Up to 1.35 from 1.1 yesterday.  We will stop the Lasix drip and start torsemide 40 mg twice daily tonight.  This is her home regimen.  If things are stable likely work on weaning milrinone tomorrow.  # Hypertension: Blood pressure is much better controlled on her current regimen.  Entresto was increased yesterday with improved blood pressure control.  # Cocaine abuse: # Tobacco abuse: Nicotine patch.  Encourage cessation.  # Moderate-severe tricuspid regurgitation: Medical management as above.  # Diabetes mellitus type 2: Hemoglobin A1c 11.7%.  # iron deficiency anemia: Per primary team.   Total critical care time: 40 minutes. Critical care time was exclusive of separately billable procedures and treating other patients. Critical care was necessary to treat or prevent imminent or life-threatening deterioration.  Critical care was time spent personally by me on the following activities: development of treatment plan with patient and/or surrogate as well as nursing, discussions with consultants, evaluation of patient's response to treatment, examination of patient, obtaining history from patient or surrogate, ordering and performing treatments and interventions, ordering and review of laboratory studies, ordering and review of radiographic studies, pulse oximetry and re-evaluation of patient's condition.    For questions or updates, please contact [facility] Please consult [email or web address] for contact info under        Signed, Chilton Si, MD

## 2022-11-13 NOTE — Progress Notes (Signed)
[facility]  - [place] at [facility]   PATIENT NAME: Molly Howard    MR#:  [id number]  DATE OF BIRTH:  Jul 05, 1985  SUBJECTIVE:   No family at bedside. Patient sitting up in the chair. On room air. Vitals stable. On IV milrinone gtt and Lasix Gtt  UOP >12 liter yday CVP 7 today VITALS:  Blood pressure (!) 143/73, pulse 91, temperature 98.4 F (36.9 C), temperature source Oral, resp. rate 19, height 5\' 7"  (1.702 m), weight 102 kg, SpO2 92%.  PHYSICAL EXAMINATION:   GENERAL:  37 y.o.-year-old patient with no acute distress. Obese LUNGS: Normal breath sounds bilaterally, no wheezing CARDIOVASCULAR: S1, S2 normal. No murmur   ABDOMEN: Soft, nontender, nondistended.abd obesity Bowel sounds present.  EXTREMITIES: + edema b/l.    NEUROLOGIC: nonfocal  patient is alert and awake   LABORATORY PANEL:  CBC Recent Labs  Lab 11/08/22 0420  WBC 4.6  HGB 10.2*  HCT 33.8*  PLT 313    Chemistries  Recent Labs  Lab 11/13/22 0402  NA 134*  K 4.8  CL 94*  CO2 29  GLUCOSE 220*  BUN 39*  CREATININE 1.35*  CALCIUM 8.6*  MG 2.2   Cardiac Enzymes No results for input(s): "TROPONINI" in the last 168 hours. RADIOLOGY:  No results found.  Assessment and Plan  Molly Howard is a 37 y.o. female with medical history significant for reduced ejection fraction heart failure, hypertension, diabetes mellitus, CKD, obesity and history of polysubstance abuse coming to [place] with complaints of swelling and fluid retention in arms and legs along with some central chest tightness and shortness of breath and all has been going on for about a week.   Acute on chronic systolic CHF exacerbation with biventricular failure: Noncompliance to diet/Meds and f/u with cardiology -- reports dry weight of around 230 pounds. Edema to abdomen.  --Has not diuresed well here so advanced heart failure team consulted on 7/8.  --Now in step-down with picc, on lasix gtt with milrinone and diuresing  well, --Total UOP >27 liters. Weight significantly down --Most recent echo in 3/24 showed EF 25-30%, mildly decreased RV systolic function, PASP 50 mmHg, moderate-severe TR, mild MR.  --switched to po torsemide. 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 H&H are stable  Received IV ferrilecit--(per cardiology orders)   HTN: continue on home dose of coreg. Cardiology has re-started entresto   DM2: poorly controlled, HbA1c 11.7. Continue on glargine, SSI w/ accuchecks  --cont farxiga   Cocaine abuse: urine drug screen was positive for cocaine. Cocaine use cessation counseling   Hypomagnesemia: now on oral Mg   Smoker: nicotine patch to prevent w/drawal.    Morbid obesity: BMI 44.8. Complicates overall care & prognosis    Left arm swelling: pvl negative for dvt/stenosis. X-ray nothing acute. Possibly 2/2 positioning and edematous state, she says she does sleep on her left side. Improving with diuresis     DVT prophylaxis: heparin SQ but refuses this. Xarelto for DVT prophylaxis inpatient only  Code Status: full  Family Communication: none @ bedside Disposition Plan: likely d/c back home  Level of care: Stepdown Status is: Inpatient Remains inpatient appropriate because: remains on milrinone and lasix gtt    TOTAL TIME TAKING CARE OF THIS PATIENT: 35 minutes.  >50% time spent on counselling and coordination of care  Note: This dictation was prepared with Dragon dictation along with smaller phrase technology. Any transcriptional errors that result from this process  are unintentional.  [name] M.D    [facility]   CC: Primary care physician; Margarita Mail, DO

## 2022-11-13 NOTE — Progress Notes (Signed)
Pt. Is non compliance of keeping the cardiac monitors l on ,keep taking off leads and blood pressure cuff  off in spite of educating of leaving it on at all times to be able to monitor ,vital sign accurately.

## 2022-11-14 DIAGNOSIS — I10 Essential (primary) hypertension: Secondary | ICD-10-CM

## 2022-11-14 DIAGNOSIS — N182 Chronic kidney disease, stage 2 (mild): Secondary | ICD-10-CM

## 2022-11-14 DIAGNOSIS — I5043 Acute on chronic combined systolic (congestive) and diastolic (congestive) heart failure: Secondary | ICD-10-CM | POA: Diagnosis not present

## 2022-11-14 DIAGNOSIS — R570 Cardiogenic shock: Secondary | ICD-10-CM

## 2022-11-14 LAB — BASIC METABOLIC PANEL
Anion gap: 9 (ref 5–15)
BUN: 44 mg/dL — ABNORMAL HIGH (ref 6–20)
CO2: 29 mmol/L (ref 22–32)
Calcium: 8.2 mg/dL — ABNORMAL LOW (ref 8.9–10.3)
Chloride: 97 mmol/L — ABNORMAL LOW (ref 98–111)
Creatinine, Ser: 1.33 mg/dL — ABNORMAL HIGH (ref 0.44–1.00)
GFR, Estimated: 53 mL/min — ABNORMAL LOW (ref >60)
Glucose, Bld: 288 mg/dL — ABNORMAL HIGH (ref 70–99)
Potassium: 5.2 mmol/L — ABNORMAL HIGH (ref 3.5–5.1)
Sodium: 135 mmol/L (ref 135–145)

## 2022-11-14 LAB — COOXEMETRY PANEL
Carboxyhemoglobin: 2.5 % — ABNORMAL HIGH (ref 0.5–1.5)
Methemoglobin: 1 % (ref 0.0–1.5)
O2 Saturation: 85.3 %
Total hemoglobin: 11.1 g/dL — ABNORMAL LOW (ref 12.0–16.0)
Total oxygen content: 82.4 %

## 2022-11-14 LAB — MAGNESIUM: Magnesium: 2.3 mg/dL (ref 1.7–2.4)

## 2022-11-14 LAB — GLUCOSE, CAPILLARY
Glucose-Capillary: 200 mg/dL — ABNORMAL HIGH (ref 70–99)
Glucose-Capillary: 225 mg/dL — ABNORMAL HIGH (ref 70–99)
Glucose-Capillary: 239 mg/dL — ABNORMAL HIGH (ref 70–99)
Glucose-Capillary: 241 mg/dL — ABNORMAL HIGH (ref 70–99)

## 2022-11-14 MED ORDER — INSULIN GLARGINE-YFGN 100 UNIT/ML ~~LOC~~ SOLN
15.0000 [IU] | Freq: Two times a day (BID) | SUBCUTANEOUS | Status: DC
  Administered 2022-11-14 – 2022-11-15 (×2): 15 [IU] via SUBCUTANEOUS
  Filled 2022-11-14 (×4): qty 0.15

## 2022-11-14 MED ORDER — SACUBITRIL-VALSARTAN 49-51 MG PO TABS: 1.0000 | ORAL_TABLET | Freq: Two times a day (BID) | ORAL | Status: DC

## 2022-11-14 MED ORDER — SACUBITRIL-VALSARTAN 24-26 MG PO TABS
1.0000 | ORAL_TABLET | Freq: Two times a day (BID) | ORAL | Status: DC
  Administered 2022-11-14 – 2022-11-15 (×3): 1 via ORAL
  Filled 2022-11-14 (×3): qty 1

## 2022-11-14 NOTE — Progress Notes (Signed)
Patient Name: JAYMESON BAKEMAN Date of Encounter: 11/14/2022 [facility] Cardiologist: Debbe Odea, MD   Interval Summary  .    Feeling well.  Happy that her swelling is gone.  Breathing continues to improve.  Vital Signs .    Vitals:   11/14/22 0504 11/14/22 0800 11/14/22 0802 11/14/22 0900  BP: (!) 98/51 116/65 116/65 (!) 95/53  Pulse: 100 99 (!) 101 98  Resp: (!) 23 (!) 25 (!) 25 18  Temp:  99.1 F (37.3 C)    TempSrc:  Oral    SpO2: 90% 95% 97% 95%  Weight:      Height:        Intake/Output Summary (Last 24 hours) at 11/14/2022 1002 Last data filed at 11/14/2022 0800 Gross per 24 hour  Intake 776.95 ml  Output 3850 ml  Net -3073.05 ml      11/14/2022    5:00 AM 11/13/2022    5:00 AM 11/12/2022    7:35 AM  Last 3 Weights  Weight (lbs) 215 lb 13.3 oz 224 lb 13.9 oz 244 lb 4.3 oz  Weight (kg) 97.9 kg 102 kg 110.8 kg      Telemetry/ECG    Sinus rhythm. Sinus tachycardia.- Personally Reviewed  Physical Exam .    VS:  BP (!) 95/53   Pulse 98   Temp 99.1 F (37.3 C) (Oral)   Resp 18   Ht 5\' 7"  (1.702 m)   Wt 97.9 kg   SpO2 95%   BMI 33.80 kg/m  , BMI Body mass index is 33.8 kg/m. GENERAL:  Well appearing.  Lying flat in bed and resting comfortably. HEENT: Pupils equal round and reactive, fundi not visualized, oral mucosa unremarkable NECK:  No jugular venous distention, waveform within normal limits, carotid upstroke brisk and symmetric, no bruits, no thyromegaly LUNGS:  Clear to auscultation bilaterally HEART:  RRR.  PMI not displaced or sustained,S1 and S2 within normal limits, no S3, no S4, no clicks, no rubs, no murmurs ABD:  Flat, positive bowel sounds normal in frequency in pitch, no bruits, no rebound, no guarding, no midline pulsatile mass, no hepatomegaly, no splenomegaly EXT:  2 plus pulses throughout, no edema, no cyanosis no clubbing SKIN:  No rashes no nodules NEURO:  Cranial nerves II through XII grossly intact, motor grossly  intact throughout PSYCH:  Cognitively intact, oriented to person place and time   Assessment & Plan .     # Acute on chronic systolic and diastolic HF:  # Biventricular heart failure: LVEF 25-30% on most recent echo 07/2022.  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  No delayed enhancement on cardiac MRI.  Patient was out of her medications for 2 weeks prior to admission and has not had regular cardiology follow-up.  UDS was positive for cocaine.  She has been managed by the heart failure service and is on milrinone with a Lasix drip.  She is not a candidate for advanced therapies due to persistently positive tox screens for cocaine.  Transitioned from lasix drip to torsemide yesterday.  Net -4.1L yesterday.  -36L since admission.  This AM CVP ranging 2 to 7.  Co-ox 85%.  We will wean off her milrinone.  BP is low this am, so we will reduce Entresto to 24/26 mg for now.  BMP pending. Continue carvedilol, Farxiga, digoxin, spironolactone and torsemide.  # Hypertension: Blood pressure is much better controlled on her current regimen.  Low today so reducing  Entresto as above.    # Cocaine abuse: # Tobacco abuse: Nicotine patch.  Encourage cessation.  # Moderate-severe tricuspid regurgitation: Medical management as above.  # Diabetes mellitus type 2: Hemoglobin A1c 11.7%.  # iron deficiency anemia: Per primary team.   Total critical care time: 30 minutes. Critical care time was exclusive of separately billable procedures and treating other patients. Critical care was necessary to treat or prevent imminent or life-threatening deterioration. Critical care was time spent personally by me on the following activities: development of treatment plan with patient and/or surrogate as well as nursing, discussions with consultants, evaluation of patient's response to treatment, examination of patient, obtaining history from patient or surrogate, ordering and  performing treatments and interventions, ordering and review of laboratory studies, ordering and review of radiographic studies, pulse oximetry and re-evaluation of patient's condition.    For questions or updates, please contact [facility] Please consult [email or web address] for contact info under        Signed, Chilton Si, MD

## 2022-11-14 NOTE — Progress Notes (Signed)
[facility]  - [place] at [facility]   PATIENT NAME: Molly Howard    MR#:  [id number]  DATE OF BIRTH:  1985-05-26  SUBJECTIVE:   No family at bedside. Patient sitting up in the chair. On room air. Vitals stable. Now weaned off Milirinone gtt UOP >4 liter yday CVP 7 today VITALS:  Blood pressure 115/70, pulse (!) 56, temperature 99.1 F (37.3 C), temperature source Oral, resp. rate 15, height 5\' 7"  (1.702 m), weight 97.9 kg, SpO2 94%.  PHYSICAL EXAMINATION:   GENERAL:  37 y.o.-year-old patient with no acute distress. Obese LUNGS: Normal breath sounds bilaterally, no wheezing CARDIOVASCULAR: S1, S2 normal. No murmur   ABDOMEN: Soft, nontender, nondistended.abd obesity Bowel sounds present.  EXTREMITIES: + edema b/l.   Skin wrinkling+ NEUROLOGIC: nonfocal  patient is alert and awake   LABORATORY PANEL:  CBC Recent Labs  Lab 11/08/22 0420  WBC 4.6  HGB 10.2*  HCT 33.8*  PLT 313    Chemistries  Recent Labs  Lab 11/14/22 0506  NA 135  K 5.2*  CL 97*  CO2 29  GLUCOSE 288*  BUN 44*  CREATININE 1.33*  CALCIUM 8.2*  MG 2.3   Cardiac Enzymes No results for input(s): "TROPONINI" in the last 168 hours. RADIOLOGY:  No results found.  Assessment and Plan  Molly Howard is a 37 y.o. female with medical history significant for reduced ejection fraction heart failure, hypertension, diabetes mellitus, CKD, obesity and history of polysubstance abuse coming to [place] with complaints of swelling and fluid retention in arms and legs along with some central chest tightness and shortness of breath and all has been going on for about a week.   Acute on chronic systolic CHF exacerbation with biventricular failure: Noncompliance to diet/Meds and f/u with cardiology -- reports dry weight of around 230 pounds. Edema to abdomen.  --Has not diuresed well here so advanced heart failure team consulted on 7/8.  --Now in step-down with picc, on lasix gtt with milrinone and  diuresing well, --Total UOP >40 liters. Weight significantly down 130 kg-->97 kg --Most recent echo in 3/24 showed EF 25-30%, mildly decreased RV systolic function, PASP 50 mmHg, moderate-severe TR, mild MR.  --switched to po torsemide. 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 H&H are stable  Received IV ferrilecit--(per cardiology orders)   HTN: continue on home dose of coreg. Cardiology has re-started entresto   DM2: poorly controlled, HbA1c 11.7. Continue on glargine, SSI w/ accuchecks  --cont farxiga   Cocaine abuse: urine drug screen was positive for cocaine. Cocaine use cessation counseling   Hypomagnesemia: now on oral Mg   Smoker: nicotine patch to prevent w/drawal.    Morbid obesity: BMI 44.8. Complicates overall care & prognosis    Left arm swelling: pvl negative for dvt/stenosis. X-ray nothing acute. Possibly 2/2 positioning and edematous state, she says she does sleep on her left side. Improving with diuresis   Transfer to PCU   DVT prophylaxis: heparin SQ but refuses this. Xarelto for DVT prophylaxis inpatient only  Code Status: full  Family Communication: none @ bedside Disposition Plan: likely d/c back home  Level of care: Progressive Status is: Inpatient Remains inpatient appropriate because: remains on milrinone and lasix gtt    TOTAL TIME TAKING CARE OF THIS PATIENT: 35 minutes.  >50% time spent on counselling and coordination of care  Note: This dictation was prepared with [employer]  dictation along with smaller phrase technology. Any transcriptional errors that result from this process are unintentional.  [name] M.D    [facility]   CC: Primary care physician; Margarita Mail, DO

## 2022-11-15 ENCOUNTER — Other Ambulatory Visit: Payer: Self-pay

## 2022-11-15 DIAGNOSIS — I5043 Acute on chronic combined systolic (congestive) and diastolic (congestive) heart failure: Secondary | ICD-10-CM | POA: Diagnosis not present

## 2022-11-15 LAB — GLUCOSE, CAPILLARY
Glucose-Capillary: 171 mg/dL — ABNORMAL HIGH (ref 70–99)
Glucose-Capillary: 242 mg/dL — ABNORMAL HIGH (ref 70–99)

## 2022-11-15 LAB — COOXEMETRY PANEL
Carboxyhemoglobin: 2.5 % — ABNORMAL HIGH (ref 0.5–1.5)
Methemoglobin: 0.9 % (ref 0.0–1.5)
O2 Saturation: 74.9 %
Total hemoglobin: 13.3 g/dL (ref 12.0–16.0)
Total oxygen content: 72.4 %

## 2022-11-15 LAB — BASIC METABOLIC PANEL
Anion gap: 7 (ref 5–15)
BUN: 54 mg/dL — ABNORMAL HIGH (ref 6–20)
CO2: 26 mmol/L (ref 22–32)
Calcium: 8.4 mg/dL — ABNORMAL LOW (ref 8.9–10.3)
Chloride: 104 mmol/L (ref 98–111)
Creatinine, Ser: 1.44 mg/dL — ABNORMAL HIGH (ref 0.44–1.00)
GFR, Estimated: 48 mL/min — ABNORMAL LOW (ref >60)
Glucose, Bld: 211 mg/dL — ABNORMAL HIGH (ref 70–99)
Potassium: 5.3 mmol/L — ABNORMAL HIGH (ref 3.5–5.1)
Sodium: 137 mmol/L (ref 135–145)

## 2022-11-15 LAB — MAGNESIUM: Magnesium: 2.6 mg/dL — ABNORMAL HIGH (ref 1.7–2.4)

## 2022-11-15 LAB — POTASSIUM: Potassium: 5.6 mmol/L — ABNORMAL HIGH (ref 3.5–5.1)

## 2022-11-15 MED ORDER — SODIUM ZIRCONIUM CYCLOSILICATE 5 G PO PACK
10.0000 g | PACK | Freq: Once | ORAL | Status: AC
  Administered 2022-11-15: 10 g via ORAL
  Filled 2022-11-15: qty 2

## 2022-11-15 MED ORDER — TORSEMIDE 20 MG PO TABS
40.0000 mg | ORAL_TABLET | Freq: Two times a day (BID) | ORAL | 2 refills | Status: DC
Start: 2022-11-15 — End: 2023-02-04
  Filled 2022-11-15: qty 120, 30d supply, fill #0

## 2022-11-15 MED ORDER — FERROUS SULFATE 325 (65 FE) MG PO TABS
325.0000 mg | ORAL_TABLET | Freq: Every day | ORAL | 3 refills | Status: DC
  Filled 2022-11-15: qty 30, 30d supply, fill #0

## 2022-11-15 MED ORDER — ATORVASTATIN CALCIUM 20 MG PO TABS
20.0000 mg | ORAL_TABLET | Freq: Every day | ORAL | 3 refills | Status: DC
Start: 2022-11-15 — End: 2023-02-04
  Filled 2022-11-15: qty 90, 90d supply, fill #0

## 2022-11-15 MED ORDER — CARVEDILOL 3.125 MG PO TABS
3.1250 mg | ORAL_TABLET | Freq: Two times a day (BID) | ORAL | 2 refills | Status: DC
Start: 2022-11-15 — End: 2022-12-14
  Filled 2022-11-15 (×2): qty 60, 30d supply, fill #0

## 2022-11-15 MED ORDER — DIGOXIN 125 MCG PO TABS
0.1250 mg | ORAL_TABLET | Freq: Every day | ORAL | 3 refills | Status: DC
  Filled 2022-11-15: qty 30, 30d supply, fill #0

## 2022-11-15 MED ORDER — NITROGLYCERIN 0.4 MG SL SUBL
0.4000 mg | SUBLINGUAL_TABLET | SUBLINGUAL | 12 refills | Status: DC | PRN
  Filled 2022-11-15: qty 25, 9d supply, fill #0

## 2022-11-15 MED ORDER — PATIROMER SORBITEX CALCIUM 8.4 G PO PACK
8.4000 g | PACK | Freq: Once | ORAL | Status: AC
  Administered 2022-11-15: 8.4 g via ORAL
  Filled 2022-11-15: qty 1

## 2022-11-15 MED ORDER — SACUBITRIL-VALSARTAN 24-26 MG PO TABS
1.0000 | ORAL_TABLET | Freq: Two times a day (BID) | ORAL | 2 refills | Status: DC
  Filled 2022-11-15: qty 60, 30d supply, fill #0

## 2022-11-15 MED ORDER — INSULIN GLARGINE 100 UNIT/ML SOLOSTAR PEN
15.0000 [IU] | PEN_INJECTOR | Freq: Two times a day (BID) | SUBCUTANEOUS | 11 refills | Status: DC
  Filled 2022-11-15: qty 15, 50d supply, fill #0

## 2022-11-15 MED ORDER — DAPAGLIFLOZIN PROPANEDIOL 10 MG PO TABS
10.0000 mg | ORAL_TABLET | Freq: Every day | ORAL | 3 refills | Status: DC
Start: 2022-11-15 — End: 2022-12-14

## 2022-11-15 MED ORDER — PATIROMER SORBITEX CALCIUM 8.4 G PO PACK
16.8000 g | PACK | Freq: Once | ORAL | Status: AC
  Administered 2022-11-15: 16.8 g via ORAL
  Filled 2022-11-15: qty 2

## 2022-11-15 NOTE — Inpatient Diabetes Management (Signed)
Inpatient Diabetes Program Recommendations  AACE/ADA: New Consensus Statement on Inpatient Glycemic Control (2015)  Target Ranges:  Prepandial:   less than 140 mg/dL      Peak postprandial:   less than 180 mg/dL (1-2 hours)      Critically ill patients:  140 - 180 mg/dL    Latest Reference Range & Units 11/14/22 08:00 11/14/22 12:26 11/14/22 16:40 11/14/22 20:54  Glucose-Capillary 70 - 99 mg/dL 366 (H)  3 units Novolog  225 (H)  5 units Novolog  12 units Semglee  239 (H)  5 units Novolog @1800  241 (H)     15 units Semglee  (H): Data is abnormally high  Latest Reference Range & Units 11/15/22 07:45 11/15/22 11:27  Glucose-Capillary 70 - 99 mg/dL 440 (H)  3 units Novolog  15 units Semglee 242 (H)  5 units Novolog   (H): Data is abnormally high  Home DM Meds: Farxiga 10 mg Daily                              Lantus 10 units AM/ 15 units PM   Current Orders: Semglee 15 units BID                           Novolog Moderate Correction Scale/ SSI (0-15 units) TID AC      Farxiga 10 mg Daily    MD- Note Semglee insulin increased to 15 units BID yesterday (pt received total of 27 units Semglee insulin yest--will get total of 30 units Semglee insulin today)  Please consider starting Novolog Meal Coverage:  Novolog 4 units TID with meals HOLD if pt NPO HOLD if pt eats <50% meals   Discharge Recommendations: Other recommendations: Pt states she needs refills on Insulin Pen Needles Supply/Referral recommendations: Pen needles - standard    --Will follow patient during hospitalization--  Ambrose Finland RN, MSN, CDCES Diabetes Coordinator Inpatient Glycemic Control Team Team Pager: [phone] (8a-5p)

## 2022-11-15 NOTE — Progress Notes (Signed)
Patient Name: Molly Howard Date of Encounter: 11/15/2022 [facility] Cardiologist: Debbe Odea, MD   Interval Summary  .    Fels good. Eager to go home. No CP or SOB. SBP 110 range  Vital Signs .    Vitals:   11/15/22 0500 11/15/22 0600 11/15/22 0700 11/15/22 0800  BP: 107/76 108/67 113/82   Pulse: 88 88    Resp:  18 (!) 29 (!) 23  Temp:      TempSrc:      SpO2: (!) 81% 91%    Weight: 97.5 kg     Height:        Intake/Output Summary (Last 24 hours) at 11/15/2022 0830 Last data filed at 11/15/2022 0400 Gross per 24 hour  Intake 997.25 ml  Output 5500 ml  Net -4502.75 ml      11/15/2022    5:00 AM 11/14/2022    5:00 AM 11/13/2022    5:00 AM  Last 3 Weights  Weight (lbs) 214 lb 15.2 oz 215 lb 13.3 oz 224 lb 13.9 oz  Weight (kg) 97.5 kg 97.9 kg 102 kg      Telemetry/ECG    Sinus rhythm 80s Personally reviewed  Scheduled Meds:  atorvastatin  20 mg Oral Daily   carvedilol  3.125 mg Oral BID WC   Chlorhexidine Gluconate Cloth  6 each Topical Daily   dapagliflozin propanediol  10 mg Oral Daily   digoxin  0.125 mg Oral Daily   insulin aspart  0-15 Units Subcutaneous TID WC   insulin glargine-yfgn  15 Units Subcutaneous BID   multivitamin with minerals  1 tablet Oral Daily   rivaroxaban  10 mg Oral Daily   sacubitril-valsartan  1 tablet Oral BID   sodium chloride flush  10-40 mL Intracatheter Q12H   sodium chloride flush  3 mL Intravenous Q12H   sodium zirconium cyclosilicate  10 g Oral Once   spironolactone  25 mg Oral Daily   torsemide  40 mg Oral BID   Continuous Infusions:  milrinone Stopped (11/14/22 1100)   PRN Meds:.acetaminophen **OR** acetaminophen, guaiFENesin, hydrALAZINE, nitroGLYCERIN, ondansetron **OR** ondansetron (ZOFRAN) IV, mouth rinse, sodium chloride flush   Physical Exam .    VS:  BP 113/82   Pulse 88   Temp 98.7 F (37.1 C) (Oral)   Resp (!) 23   Ht 5\' 7"  (1.702 m)   Wt 97.5 kg   SpO2 91%   BMI 33.67 kg/m  , BMI  Body mass index is 33.67 kg/m. General:  Well appearing. No resp difficulty HEENT: normal Neck: supple. no JVD. Carotids 2+ bilat; no bruits. No lymphadenopathy or thryomegaly appreciated. Cor: PMI nondisplaced. Regular rate & rhythm. No rubs, gallops or murmurs. Lungs: clear Abdomen:obese soft, nontender, nondistended. No hepatosplenomegaly. No bruits or masses. Good bowel sounds. Extremities: no cyanosis, clubbing, rash, edema Neuro: alert & orientedx3, cranial nerves grossly intact. moves all 4 extremities w/o difficulty. Affect pleasant   Assessment & Plan .     # Acute on chronic systolic and diastolic HF:  # Biventricular heart failure: LVEF 25-30% on most recent echo 07/2022.  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  No delayed enhancement on cardiac MRI.  Patient was out of her medications for 2 weeks prior to admission and has not had regular cardiology follow-up.  UDS was positive for cocaine.  She has been managed by the heart failure service and is on milrinone with a Lasix drip.  She is  not a candidate for advanced therapies due to persistently positive tox screens for cocaine.  Transitioned from lasix drip to torsemide yesterday.  Net -4.1L yesterday.  -36L since admission. - stable off milrinone. Volume looks good. Ok to go home today. With K running > 5.0 and no guarantee that she will comply with f/u would stop spiro  Meds for d/c Entresto 24/26 bid Carvedilol 3.125 bid Digoxin 0.125 daily Torsemide 40 bid Farxiga 10  Atorva 20  Stop spiro  F/u will be arranged in HF clinic   # Hypertension: Blood pressure is much better controlled on her current regimen.  Low today so reducing Entresto as above.    # Cocaine abuse: # Tobacco abuse: Nicotine patch.  Encourage cessation.  # Moderate-severe tricuspid regurgitation: Medical management as above.  # Diabetes mellitus type 2: Hemoglobin A1c 11.7%. - Per primary  #  iron deficiency anemia: Per primary team.   Discussed with PharmD who will help with med access.   For questions or updates, please contact [facility] Please consult [email or web address] for contact info under        Signed, Arvilla Meres, MD

## 2022-11-15 NOTE — Progress Notes (Signed)
[facility]  - [place] at [facility]   PATIENT NAME: Molly Howard    MR#:  [id number]  DATE OF BIRTH:  09-10-1985  SUBJECTIVE:   No family at bedside. Patient sitting up in the chair. On room air. Vitals stable. Pt appears Euvolemic UOP -40L VITALS:  Blood pressure 122/81, pulse 89, temperature 98.8 F (37.1 C), resp. rate (!) 23, height 5\' 7"  (1.702 m), weight 97.5 kg, SpO2 100%.  PHYSICAL EXAMINATION:   GENERAL:  37 y.o.-year-old patient with no acute distress. Obese LUNGS: Normal breath sounds bilaterally, no wheezing CARDIOVASCULAR: S1, S2 normal. No murmur   ABDOMEN: Soft, nontender, nondistended.abd obesity Bowel sounds present.  EXTREMITIES: + edema b/l.   Skin wrinkling+ NEUROLOGIC: nonfocal  patient is alert and awake   LABORATORY PANEL:  CBC No results for input(s): "WBC", "HGB", "HCT", "PLT" in the last 168 hours.   Chemistries  Recent Labs  Lab 11/15/22 0412 11/15/22 1234  NA 137  --   K 5.3* 5.6*  CL 104  --   CO2 26  --   GLUCOSE 211*  --   BUN 54*  --   CREATININE 1.44*  --   CALCIUM 8.4*  --   MG 2.6*  --    Cardiac Enzymes No results for input(s): "TROPONINI" in the last 168 hours. RADIOLOGY:  No results found.  Assessment and Plan  Molly Howard is a 37 y.o. female with medical history significant for reduced ejection fraction heart failure, hypertension, diabetes mellitus, CKD, obesity and history of polysubstance abuse coming to [place] with complaints of swelling and fluid retention in arms and legs along with some central chest tightness and shortness of breath and all has been going on for about a week.   Acute on chronic systolic CHF exacerbation with biventricular failure: Noncompliance to diet/Meds and f/u with cardiology -- reports dry weight of around 230 pounds. Edema to abdomen.  --Has not diuresed well here so advanced heart failure team consulted on 7/8.  --Now in step-down with picc, on lasix gtt with milrinone and  diuresing well, --Total UOP >40 liters. Weight significantly down 130 kg-->97 kg --Most recent echo in 3/24 showed EF 25-30%, mildly decreased RV systolic function, PASP 50 mmHg, moderate-severe TR, mild MR.  --switched to po torsemide. 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 H&H are stable  Received IV ferrilecit--(per cardiology orders)   HTN: continue on home dose of coreg. Cardiology has re-started entresto   DM2: poorly controlled, HbA1c 11.7. Continue on glargine, SSI w/ accuchecks  --cont farxiga   Cocaine abuse: urine drug screen was positive for cocaine. Cocaine use cessation counseling   Hypomagnesemia: now on oral Mg   Smoker: nicotine patch    Morbid obesity: BMI 44.8. Complicates overall care & prognosis    Left arm swelling: pvl negative for dvt/stenosis. X-ray nothing acute. Possibly 2/2 positioning and edematous state, she says she does sleep on her left side. Improving with diuresis   Transfer to med tele   DVT prophylaxis: heparin SQ but refuses this. Xarelto for DVT prophylaxis inpatient only  Code Status: full  Family Communication: none @ bedside Disposition Plan: likely d/c back home  Level of care: Progressive Status is: Inpatient Remains inpatient appropriate because: remains on milrinone and lasix gtt  TOTAL TIME TAKING CARE OF THIS PATIENT: 35 minutes.  >50% time spent on counselling and coordination of care  Note: This dictation was prepared with Dragon dictation along with smaller phrase technology. Any transcriptional errors that result from this process are unintentional.  [name] M.D    [facility]   CC: Primary care physician; Margarita Mail, DO

## 2022-11-15 NOTE — Progress Notes (Signed)
0800 Up in room. Refuses to wear heart monitor. Walking in halls. 1200 Patient put clothes on. Insist she is going home. Potassium rechecked. Potassium increased from 5.3 to 5.6. Dr Allena Katz wishes to keep patient another day. Patient refuses to stay and threatens to go home AMA. Dr. Allena Katz talked with patient. PICC line removed per order. Home medications delivered from out patient pharmacy. 1535 Discharged home with dad.

## 2022-11-15 NOTE — Plan of Care (Signed)
  Problem: Education: Goal: Ability to demonstrate management of disease process will improve Outcome: Progressing Goal: Ability to verbalize understanding of medication therapies will improve Outcome: Progressing   Problem: Activity: Goal: Capacity to carry out activities will improve Outcome: Progressing   Problem: Cardiac: Goal: Ability to achieve and maintain adequate cardiopulmonary perfusion will improve Outcome: Progressing   Problem: Fluid Volume: Goal: Ability to maintain a balanced intake and output will improve Outcome: Progressing   Problem: Health Behavior/Discharge Planning: Goal: Ability to manage health-related needs will improve Outcome: Progressing   Problem: Nutritional: Goal: Maintenance of adequate nutrition will improve Outcome: Progressing

## 2022-11-15 NOTE — Discharge Summary (Signed)
Physician Discharge Summary   Patient: Molly Howard MRN: [id number] DOB: March 30, 1986  Admit date:     11/05/2022  Discharge date: 11/15/22  Discharge Physician: [name]   PCP: Margarita Mail, DO   Recommendations at discharge:   patient to follow-up with PCP in two days to get metabolic panel checked. Patient will call and make appointment she is advised to do so although up [facility] cardiology in one week  Discharge Diagnoses: Principal Problem:   Acute on chronic combined systolic and diastolic CHF (congestive heart failure) (HCC) Active Problems:   Chronic anemia   Essential hypertension   Diabetes mellitus, type II (HCC)   Chronic kidney disease (CKD), stage II (mild)   GERD without esophagitis   Swelling   Tobacco use   Hypomagnesemia   Cocaine abuse (HCC)   Bilateral leg edema   Cardiogenic shock (HCC)   Molly Howard is a 37 y.o. female with medical history significant for reduced ejection fraction heart failure, hypertension, diabetes mellitus, CKD, obesity and history of polysubstance abuse coming to [place] with complaints of swelling and fluid retention in arms and legs along with some central chest tightness and shortness of breath and all has been going on for about a week.    Acute on chronic systolic CHF exacerbation with biventricular failure: Noncompliance to diet/Meds and f/u with cardiology --Has not diuresed well here so advanced heart failure team consulted on 7/8.  --Now in step-down with picc, on lasix gtt with milrinone and diuresing well, --Total UOP >40 liters. Weight significantly down 130 kg-->97 kg --Most recent echo in 3/24 showed EF 25-30%, mildly decreased RV systolic function, PASP 50 mmHg, moderate-severe TR, mild MR.  --switched to po torsemide. Cont IV milrinone for now --weaned off IV milrinone gtt --con cardiac meds --creat 1.33 --cont enteresto,coreg,digoxin,torsemide, farxiga, lipitor --at discharge per Dr Gala Romney-- meds were filled by  [facility] pharmacy --weight down to 97 KG   Hyperkalemia --d/ced spironolactone by cardiology --K 5.3--lokelma,veltassa--K 5.6--will repeat meds today since pt wants to leave today and does not want to wait -- she is advised to ask primary care physician will check her labs in two days to ensure potassium is stable.   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 H&H are stable  Received IV ferrilecit--(per cardiology orders)   HTN: continue on home dose of coreg. Cardiology has re-started entresto   DM2: poorly controlled, HbA1c 11.7. Continue on glargine, SSI w/ accuchecks  --cont farxiga   Cocaine abuse: urine drug screen was positive for cocaine. Cocaine use cessation counseling   Hypomagnesemia: now on oral Mg   Smoker: nicotine patch    Morbid obesity: BMI 44.8. Complicates overall care & prognosis    Left arm swelling: pvl negative for dvt/stenosis. X-ray nothing acute. Possibly 2/2 positioning and edematous state, she says she does sleep on her left side. Improving with diuresis   Transfer to med tele   DVT prophylaxis: heparin SQ but refuses this. Xarelto for DVT prophylaxis inpatient only  Code Status: full  Family Communication: none @ bedside Disposition Plan: d/c back home   Patient understands with her leaving with potassium of 5.6 she is at risk for cardiorespiratory arrest. She is advised to follow-up with her primary care physician in 1 to 2 days to get labs checked. She was received two extra doses of potassium loading meds before she left. I did not want her to leave AMA given her medical condition. She was given meds filled up by [facility] pharmacy.  Consultants: [facility] cardiology Procedures performed: PICC Disposition: Home Diet recommendation:  Cardiac and Carb modified diet DISCHARGE MEDICATION: Allergies as of 11/15/2022       Reactions   No [employer] Food Allergies Rash, Other (See Comments)   Lemons        Medication List     STOP taking these medications     sacubitril-valsartan 49-51 MG Commonly known as: ENTRESTO Replaced by: Sherryll Burger 24-26 MG   spironolactone 25 MG tablet Commonly known as: ALDACTONE       TAKE these medications    atorvastatin 20 MG tablet Commonly known as: LIPITOR Take 1 tablet (20 mg total) by mouth daily.   calcium carbonate 1250 (500 Ca) MG tablet Commonly known as: OS-CAL - dosed in mg of elemental calcium Take 1 tablet (1,250 mg total) by mouth 3 (three) times daily with meals.   carvedilol 3.125 MG tablet Commonly known as: COREG Take 1 tablet (3.125 mg total) by mouth 2 (two) times daily with a meal.   dapagliflozin propanediol 10 MG Tabs tablet Commonly known as: FARXIGA Take 1 tablet (10 mg total) by mouth once daily.   digoxin 0.125 MG tablet Commonly known as: LANOXIN Take 1 tablet (0.125 mg total) by mouth daily.   Entresto 24-26 MG Generic drug: sacubitril-valsartan Take 1 tablet by mouth 2 (two) times daily. Replaces: sacubitril-valsartan 49-51 MG   FeroSul 325 (65 Fe) MG tablet Generic drug: ferrous sulfate Take 1 tablet (325 mg total) by mouth daily with breakfast.   insulin glargine 100 UNIT/ML Solostar Pen Commonly known as: LANTUS Inject 15 Units into the skin 2 (two) times daily. What changed:  how much to take how to take this when to take this additional instructions   nitroGLYCERIN 0.4 MG SL tablet Commonly known as: NITROSTAT Place 1 tablet (0.4 mg total) under the tongue every 5 (five) minutes as needed for chest pain.   torsemide 20 MG tablet Commonly known as: DEMADEX Take 2 tablets (40 mg total) by mouth in the morning and at bedtime. What changed: medication strength        Follow-up Information     Margarita Mail, DO. Schedule an appointment as soon as possible for a visit in 2 day(s).   Specialty: Internal Medicine Why: to get BMP (potassium checked) Contact information: [address] [place]  [address] [phone]         Antonieta Iba, MD. Schedule an appointment as soon as possible for a visit in 1 week(s).   Specialty: Cardiology Why: f/u CHF Contact information: [address] [place] [address] [phone]                Discharge Exam: Filed Weights   11/13/22 0500 11/14/22 0500 11/15/22 0500  Weight: 102 kg 97.9 kg 97.5 kg     Condition at discharge: fair  The results of significant diagnostics from this hospitalization (including imaging, microbiology, ancillary and laboratory) are listed below for reference.   Imaging Studies: [place] EKG SITE RITE  Result Date: 11/08/2022 If Site Rite image not attached, placement could not be confirmed due to current cardiac rhythm.  DG Shoulder Left  Result Date: 11/07/2022 CLINICAL DATA:  Left shoulder pain.  Fall 3 months ago. EXAM: LEFT SHOULDER - 2+ VIEW COMPARISON:  Shoulder radiograph 08/08/2021 FINDINGS: No acute or evidence of prior fracture. Mild degenerative spurring of the glenohumeral joint. Chronic spurring about the acromion. No erosive or bony destructive change. There is mild  generalized soft tissue edema. IMPRESSION: 1. Mild degenerative spurring of the glenohumeral joint. 2. Chronic spurring about the acromion. 3. Soft tissue edema. Electronically Signed   By: Narda Rutherford M.D.   On: 11/07/2022 15:21   US Venous Img Upper Uni Left (DVT)  Result Date: 11/06/2022 CLINICAL DATA:  Left arm swelling EXAM: LEFT UPPER EXTREMITY VENOUS DOPPLER ULTRASOUND TECHNIQUE: Gray-scale sonography with graded compression, as well as color Doppler and duplex ultrasound were performed to evaluate the upper extremity deep venous system from the level of the subclavian vein and including the jugular, axillary, basilic, radial, ulnar and upper cephalic vein. Spectral Doppler was utilized to evaluate flow at rest and with distal augmentation maneuvers. COMPARISON:  None Available. FINDINGS: Contralateral  Subclavian Vein: Respiratory phasicity is normal and symmetric with the symptomatic side. No evidence of thrombus. Normal compressibility. Internal Jugular Vein: No evidence of thrombus. Normal compressibility, respiratory phasicity and response to augmentation. Subclavian Vein: No evidence of thrombus. Normal compressibility, respiratory phasicity and response to augmentation. Axillary Vein: No evidence of thrombus. Normal compressibility, respiratory phasicity and response to augmentation. Cephalic Vein: No evidence of thrombus. Normal compressibility, respiratory phasicity and response to augmentation. Basilic Vein: No evidence of thrombus. Normal compressibility, respiratory phasicity and response to augmentation. Brachial Veins: No evidence of thrombus. Normal compressibility, respiratory phasicity and response to augmentation. Radial Veins: No evidence of thrombus. Normal compressibility, respiratory phasicity and response to augmentation. Ulnar Veins: No evidence of thrombus. Normal compressibility, respiratory phasicity and response to augmentation. Venous Reflux:  None visualized. Other Findings:  None visualized. IMPRESSION: No evidence of DVT within the left upper extremity. Electronically Signed   By: Charlett Nose M.D.   On: 11/06/2022 22:17   CT HEAD WO CONTRAST ( )  Result Date: 11/06/2022 CLINICAL DATA:  Blurry vision EXAM: CT HEAD WITHOUT CONTRAST TECHNIQUE: Contiguous axial images were obtained from the base of the skull through the vertex without intravenous contrast. RADIATION DOSE REDUCTION: This exam was performed according to the departmental dose-optimization program which includes automated exposure control, adjustment of the mA and/or kV according to patient size and/or use of iterative reconstruction technique. COMPARISON:  None Available. FINDINGS: Brain: There is no mass, hemorrhage or extra-axial collection. The size and configuration of the ventricles and extra-axial CSF spaces are  normal. There is hypoattenuation of the white matter, most commonly indicating chronic small vessel disease. Age-indeterminate infarct within the left frontal lobe. Old ischemic insults within the right frontal white matter. Vascular: No abnormal hyperdensity of the major intracranial arteries or dural venous sinuses. No intracranial atherosclerosis. Skull: The visualized skull base, calvarium and extracranial soft tissues are normal. Sinuses/Orbits: No fluid levels or advanced mucosal thickening of the visualized paranasal sinuses. No mastoid or middle ear effusion. The orbits are normal. IMPRESSION: 1. No acute intracranial hemorrhage. 2. Age-indeterminate infarct within the left frontal lobe. MRI could provide better temporal characterization. 3. Old ischemic insults within the right frontal white matter. Electronically Signed   By: Deatra Robinson M.D.   On: 11/06/2022 03:16   DG Chest 2 View  Result Date: 11/05/2022 CLINICAL DATA:  Chest pain EXAM: CHEST - 2 VIEW COMPARISON:  07/24/2022 FINDINGS: Cardiac shadow is enlarged. Scarring is again seen in the left mid lung. No focal infiltrate is noted. Mild central vascular congestion is seen. No bony abnormality is noted. IMPRESSION: Mild vascular congestion Electronically Signed   By: Alcide Clever M.D.   On: 11/05/2022 20:16    Microbiology: Results for orders placed or performed during  the hospital encounter of 11/05/22  MRSA Next Gen by PCR, Nasal     Status: None   Collection Time: 11/08/22  6:00 PM   Specimen: Nasal Mucosa; Nasal Swab  Result Value Ref Range Status   MRSA by PCR Next Gen NOT DETECTED NOT DETECTED Final    Comment: (NOTE) The GeneXpert MRSA Assay (FDA approved for NASAL specimens only), is one component of a comprehensive MRSA colonization surveillance program. It is not intended to diagnose MRSA infection nor to guide or monitor treatment for MRSA infections. Test performance is not FDA approved in patients less than 20  years old. Performed at [facility], [address] [place], [place] [address]     Labs: CBC: No results for input(s): "WBC", "NEUTROABS", "HGB", "HCT", "MCV", "PLT" in the last 168 hours. Basic Metabolic Panel: Recent Labs  Lab 11/11/22 0400 11/12/22 0555 11/13/22 0402 11/14/22 0506 11/15/22 0412 11/15/22 1234  NA 137 139 134* 135 137  --   K 3.9 3.9 4.8 5.2* 5.3* 5.6*  CL 99 100 94* 97* 104  --   CO2 33* 31 29 29 26   --   GLUCOSE 169* 164* 220* 288* 211*  --   BUN 35* 32* 39* 44* 54*  --   CREATININE 1.20* 1.11* 1.35* 1.33* 1.44*  --   CALCIUM 7.5* 7.5* 8.6* 8.2* 8.4*  --   MG 2.0 1.9 2.2 2.3 2.6*  --    Liver Function Tests: No results for input(s): "AST", "ALT", "ALKPHOS", "BILITOT", "PROT", "ALBUMIN" in the last 168 hours. CBG: Recent Labs  Lab 11/14/22 1226 11/14/22 1640 11/14/22 2054 11/15/22 0745 11/15/22 1127  GLUCAP 225* 239* 241* 171* 242*    Discharge time spent: greater than 30 minutes.  Signed: [name], MD [facility] 11/15/2022

## 2022-11-16 ENCOUNTER — Other Ambulatory Visit: Payer: Self-pay

## 2022-11-16 ENCOUNTER — Encounter: Payer: Self-pay | Admitting: Internal Medicine

## 2022-11-16 ENCOUNTER — Ambulatory Visit (INDEPENDENT_AMBULATORY_CARE_PROVIDER_SITE_OTHER): Payer: Medicaid Other | Admitting: Internal Medicine

## 2022-11-16 ENCOUNTER — Other Ambulatory Visit: Payer: Self-pay | Admitting: Internal Medicine

## 2022-11-16 VITALS — BP 140/78 | HR 104 | Temp 97.8°F | Resp 16 | Ht 67.0 in | Wt 212.4 lb

## 2022-11-16 DIAGNOSIS — R809 Proteinuria, unspecified: Secondary | ICD-10-CM

## 2022-11-16 DIAGNOSIS — E875 Hyperkalemia: Secondary | ICD-10-CM

## 2022-11-16 DIAGNOSIS — I428 Other cardiomyopathies: Secondary | ICD-10-CM | POA: Diagnosis not present

## 2022-11-16 DIAGNOSIS — E1129 Type 2 diabetes mellitus with other diabetic kidney complication: Secondary | ICD-10-CM

## 2022-11-16 DIAGNOSIS — Z09 Encounter for follow-up examination after completed treatment for conditions other than malignant neoplasm: Secondary | ICD-10-CM

## 2022-11-16 DIAGNOSIS — N182 Chronic kidney disease, stage 2 (mild): Secondary | ICD-10-CM

## 2022-11-16 DIAGNOSIS — Z794 Long term (current) use of insulin: Secondary | ICD-10-CM

## 2022-11-16 DIAGNOSIS — I5082 Biventricular heart failure: Secondary | ICD-10-CM

## 2022-11-16 DIAGNOSIS — H539 Unspecified visual disturbance: Secondary | ICD-10-CM

## 2022-11-16 DIAGNOSIS — I10 Essential (primary) hypertension: Secondary | ICD-10-CM

## 2022-11-16 NOTE — Progress Notes (Signed)
Established Patient Office Visit  Subjective:  Patient ID: Molly Howard, female    DOB: 29-Sep-1985  Age: 37 y.o. MRN: [id number]  CC:  Chief Complaint  Patient presents with   Follow-up    HPI Molly Howard presents for hospital follow up. She was hospitalized again for CHF exacerbation 11/05/22.  Discharge Date: 11/15/22 Diagnosis: CHF exacerbation with biventricular failure, hyperkalemia, AoCD, DM2, cocaine use Procedures/tests: CT Head showing no acute intracranial hemorrhage but age-indeterminate infarct within the left frontal lobe, old ischemic infarcts within the right frontal white matter. Venous US negative. Chest x-ray with mild vascular congestion. Potassium yesterday 5.6, creatinine 1.44, GFR 48 Consultants: Cardiology New medications: see below  Discontinued medications: See below Discharge instructions:  recheck BMP  Status: stable  Hypertension/HFrEF/NICM: -Medications: Torsemide 40 mg BID, Carvedilol 3.125 mg BID, Entresto, Farxiga 10 mg, Digoxin 0.125 mg  -Follows with Cardiology note reviewed from 01/15/22 but not since, has an appointment in August  -Last echo 07/25/22 showing reduced EF at 25-30% with global hypokinesis of the left ventricle   Diabetes Type 2: -Last A1c 7/24 11.7% -Medications: Fargixa 10mg , Lantus changed to 15 units in the am, 15 units in the pm  -Patient states she is general compliant with the above medications and reports no side effects.  -Checking BG at home: not checking  -Eye exam: Due - having vision changes -Foot exam: Due today -Microalbumin: UTD 6/24 -Statin: yes  -PNA vaccine: Prevnar 23 in 7/22 -Denies symptoms of hypoglycemia, polyuria, polydipsia, numbness extremities, foot ulcers/trauma.   Hyperkalemia:  -Elevated at 5.6 yesterday inpatient  -Spironolactone discontinued, had been treated with Thompson Caul and Veltessa but patient left AMA  HLD: -Medications: Lipitor 20 mg  -Patient is compliant with above medications and  reports no side effects.  -Last lipid panel: Lipid Panel     Component Value Date/Time   CHOL 216 (H) 05/27/2022 1022   TRIG 124 05/27/2022 1022   HDL 46 05/27/2022 1022   CHOLHDL 4.7 05/27/2022 1022   VLDL 25 05/27/2022 1022   LDLCALC 145 (H) 05/27/2022 1022   LDLCALC 171 (H) 06/12/2021 1354   GERD: -Not currently on daily medications, taking Pepcid as needed   CKD/AoCD: -Last creatinine 11/15/22 1.44, GFR 48 -Last hgb 10.2 11/08/22    Past Medical History:  Diagnosis Date   Acid reflux    Chronic HFrEF (heart failure with reduced ejection fraction) (HCC)    a. 10/2019 Echo: EF 35-40%, GrII DD; b. 05/2020 Echo: EF 20-25%, glob HK; c. 10/2020 Echo: EF 30-35%, glob HK. GrII DD, Mildly red RV fxn. Mod TR; d.  05/2021 cMRI: EF 42%, no LGE. Nl RV size/fxn.   CKD (chronic kidney disease), stage II    Diabetes mellitus (HCC)    H/O medication noncompliance    Hypertension    Microcytic anemia    NICM (nonischemic cardiomyopathy) (HCC)    a. 10/2019 Echo: EF 35-40%; b. 10/2019 MV: No ischemia. Small apical defect-->breast attenuation; c. 05/2020 Echo: EF 20-25%; d. 10/2020 Echo: EF 30-35%, glob HK. GrII DD, Mildly red RV fxn. Mod TR; e. 05/2021 cMRI: EF 42%, no LGE. Nl RV size/fxn.   Obesity    Polysubstance abuse [facility])     Past Surgical History:  Procedure Laterality Date   CHOLECYSTECTOMY     HERNIA REPAIR     RIGHT HEART CATH N/A 07/28/2022   Procedure: RIGHT HEART CATH;  Surgeon: Iran Ouch, MD;  Location: [facility] INVASIVE CV LAB;  Service: Cardiovascular;  Laterality: N/A;    Family History  Problem Relation Age of Onset   Heart failure Mother        Onset of heart failure 22s.  Died in 05-05-2022.   Diabetes Mother    Hypertension Father    Diabetes Father     Social History   Socioeconomic History   Marital status: Single    Spouse name: Not on file   Number of children: Not on file   Years of education: Not on file   Highest education level: Not on file   Occupational History   Not on file  Tobacco Use   Smoking status: Former    Current packs/day: 0.00    Types: Cigarettes    Quit date: 2022    Years since quitting: 2.5   Smokeless tobacco: Never  Vaping Use   Vaping status: Never Used  Substance and Sexual Activity   Alcohol use: Not Currently    Comment: ~ 4 shots/day on weekends only   Drug use: Not Currently    Types: Cocaine    Comment: Admits to using cocaine up and will February 2024.   Sexual activity: Not Currently    Birth control/protection: None  Other Topics Concern   Not on file  Social History Narrative   Lives locally.  Does not routinely exercise.  Has been using cocaine.   Social Determinants of Health   Financial Resource Strain: High Risk (06/16/2020)   Overall Financial Resource Strain (CARDIA)    Difficulty of Paying Living Expenses: Hard  Food Insecurity: No Food Insecurity (11/06/2022)   Hunger Vital Sign    Worried About Running Out of Food in the Last Year: Never true    Ran Out of Food in the Last Year: Never true  Transportation Needs: No Transportation Needs (11/06/2022)   PRAPARE - Administrator, Civil Service (Medical): No    Lack of Transportation (Non-Medical): No  Physical Activity: Not on file  Stress: Not on file  Social Connections: Not on file  Intimate Partner Violence: Not At Risk (11/06/2022)   Humiliation, Afraid, Rape, and Kick questionnaire    Fear of Current or Ex-Partner: No    Emotionally Abused: No    Physically Abused: No    Sexually Abused: No    Outpatient Medications Prior to Visit  Medication Sig Dispense Refill   atorvastatin (LIPITOR) 20 MG tablet Take 1 tablet (20 mg total) by mouth daily. 90 tablet 3   calcium carbonate (OS-CAL - DOSED IN MG OF ELEMENTAL CALCIUM) 1250 (500 Ca) MG tablet Take 1 tablet (1,250 mg total) by mouth 3 (three) times daily with meals. 60 tablet 0   carvedilol (COREG) 3.125 MG tablet Take 1 tablet (3.125 mg total) by mouth 2  (two) times daily with a meal. 60 tablet 2   dapagliflozin propanediol (FARXIGA) 10 MG TABS tablet Take 1 tablet (10 mg total) by mouth once daily. 90 tablet 3   digoxin (LANOXIN) 0.125 MG tablet Take 1 tablet (0.125 mg total) by mouth daily. 30 tablet 3   ferrous sulfate 325 (65 FE) MG tablet Take 1 tablet (325 mg total) by mouth daily with breakfast. 30 tablet 3   insulin glargine (LANTUS) 100 UNIT/ML Solostar Pen Inject 15 Units into the skin 2 (two) times daily. 15 mL 11   nitroGLYCERIN (NITROSTAT) 0.4 MG SL tablet Place 1 tablet (0.4 mg total) under the tongue every 5 (five) minutes as needed for chest pain. 25 tablet 12  sacubitril-valsartan (ENTRESTO) 24-26 MG Take 1 tablet by mouth 2 (two) times daily. 60 tablet 2   torsemide (DEMADEX) 20 MG tablet Take 2 tablets (40 mg total) by mouth in the morning and at bedtime. 120 tablet 2   No facility-administered medications prior to visit.    Allergies  Allergen Reactions   No [employer] Food Allergies Rash and Other (See Comments)    Lemons    ROS Review of Systems  Constitutional:  Negative for chills and fever.  Respiratory:  Negative for cough and shortness of breath.   Cardiovascular:  Negative for chest pain and leg swelling.  Gastrointestinal:  Negative for diarrhea, nausea and vomiting.  Skin: Negative.       Objective:    Physical Exam Constitutional:      Appearance: Normal appearance.  HENT:     Head: Normocephalic and atraumatic.  Eyes:     Conjunctiva/sclera: Conjunctivae normal.  Cardiovascular:     Rate and Rhythm: Normal rate and regular rhythm.     Pulses:          Dorsalis pedis pulses are 2+ on the right side and 2+ on the left side.  Pulmonary:     Effort: Pulmonary effort is normal.     Breath sounds: Normal breath sounds. No wheezing, rhonchi or rales.  Musculoskeletal:     Right lower leg: No edema.     Left lower leg: No edema.     Right foot: Normal range of motion. No deformity, bunion,  Charcot foot, foot drop or prominent metatarsal heads.     Left foot: Normal range of motion. No deformity, bunion, Charcot foot, foot drop or prominent metatarsal heads.  Feet:     Right foot:     Protective Sensation: 6 sites tested.  6 sites sensed.     Skin integrity: Skin integrity normal.     Toenail Condition: Right toenails are abnormally thick.     Left foot:     Protective Sensation: 6 sites tested.  6 sites sensed.     Skin integrity: Skin integrity normal.     Toenail Condition: Left toenails are abnormally thick.  Skin:    General: Skin is warm and dry.  Neurological:     General: No focal deficit present.     Mental Status: She is alert. Mental status is at baseline.  Psychiatric:        Mood and Affect: Mood normal.        Behavior: Behavior normal.     BP (!) 140/78   Pulse (!) 104   Temp 97.8 F (36.6 C)   Resp 16   Ht 5\' 7"  (1.702 m)   Wt 212 lb 6.4 oz (96.3 kg)   SpO2 96%   BMI 33.27 kg/m  Wt Readings from Last 3 Encounters:  11/16/22 212 lb 6.4 oz (96.3 kg)  11/15/22 214 lb 15.2 oz (97.5 kg)  10/04/22 262 lb 3.2 oz (118.9 kg)     Health Maintenance Due  Topic Date Due   OPHTHALMOLOGY EXAM  Never done   FOOT EXAM  06/18/2022    There are no preventive care reminders to display for this patient.  Lab Results  Component Value Date   TSH 5.114 (H) 11/09/2021   Lab Results  Component Value Date   WBC 4.6 11/08/2022   HGB 10.2 (L) 11/08/2022   HCT 33.8 (L) 11/08/2022   MCV 88.9 11/08/2022   PLT 313 11/08/2022   Lab Results  Component  Value Date   NA 137 11/15/2022   K 5.6 (H) 11/15/2022   CO2 26 11/15/2022   GLUCOSE 211 (H) 11/15/2022   BUN 54 (H) 11/15/2022   CREATININE 1.44 (H) 11/15/2022   BILITOT 0.7 11/06/2022   ALKPHOS 167 (H) 11/06/2022   AST 18 11/06/2022   ALT 11 11/06/2022   PROT 6.0 (L) 11/06/2022   ALBUMIN 1.8 (L) 11/06/2022   CALCIUM 8.4 (L) 11/15/2022   ANIONGAP 7 11/15/2022   EGFR 52 (L) 06/12/2021   Lab Results   Component Value Date   CHOL 216 (H) 05/27/2022   Lab Results  Component Value Date   HDL 46 05/27/2022   Lab Results  Component Value Date   LDLCALC 145 (H) 05/27/2022   Lab Results  Component Value Date   TRIG 124 05/27/2022   Lab Results  Component Value Date   CHOLHDL 4.7 05/27/2022   Lab Results  Component Value Date   HGBA1C 11.7 (H) 11/05/2022      Assessment & Plan:   1. Hospital discharge follow-up/Biventricular congestive heart failure (HCC)/Nonischemic cardiomyopathy (HCC)/Essential hypertension/Chronic kidney disease (CKD), stage II (mild):: Hospital discharge summary, labs and imaging reviewed. Patient left AMA. Had frank conversation with the patient about importance of medical compliance, both with medications and appointments. Referral to CCM placed to help with organization, substance abuse and transportation. Reviewed medication changes, will recheck BMP. Patient told that if potassium still elevated she will need to go back to the hospital. Follow up here in 1 month. Has Cardiology appointment in August.   - Basic Metabolic Panel (BMET) - AMB Referral to Managed Medicaid Care Management  2. Hyperkalemia: Recheck potassium levels, now on Torsemide 80 mg daily, Spironolactone discontinued.   - Basic Metabolic Panel (BMET)  3. Type 2 diabetes mellitus with microalbuminuria, with long-term current use of insulin (HCC): Lantus increased to 15 units BID, patient to pick it up at the pharmacy. A1c elevated. Foot exam today.   - AMB Referral to Managed Medicaid Care Management - HM Diabetes Foot Exam  4. Vision changes: CT head showing old infarcts, referral placed for diabetic eye exam and vision changes.   - Ambulatory referral to Ophthalmology   Follow-up: Return in about 4 weeks (around 12/14/2022).    Margarita Mail, DO

## 2022-11-17 ENCOUNTER — Telehealth: Payer: Self-pay | Admitting: Internal Medicine

## 2022-11-17 ENCOUNTER — Other Ambulatory Visit: Payer: Self-pay

## 2022-11-17 LAB — BASIC METABOLIC PANEL
BUN/Creatinine Ratio: 34 (calc) — ABNORMAL HIGH (ref 6–22)
BUN: 57 mg/dL — ABNORMAL HIGH (ref 7–25)
CO2: 27 mmol/L (ref 20–32)
Calcium: 8.8 mg/dL (ref 8.6–10.2)
Chloride: 100 mmol/L (ref 98–110)
Creat: 1.66 mg/dL — ABNORMAL HIGH (ref 0.50–0.97)
Glucose, Bld: 311 mg/dL — ABNORMAL HIGH (ref 65–99)
Potassium: 5.1 mmol/L (ref 3.5–5.3)
Sodium: 138 mmol/L (ref 135–146)

## 2022-11-17 NOTE — Telephone Encounter (Signed)
Patient called stated she needs the needles to prick herself for her insulin. She says she has everything except the needles. Please f/u with patient

## 2022-11-18 ENCOUNTER — Other Ambulatory Visit: Payer: Self-pay | Admitting: Internal Medicine

## 2022-11-18 ENCOUNTER — Other Ambulatory Visit: Payer: Self-pay

## 2022-11-18 DIAGNOSIS — E1129 Type 2 diabetes mellitus with other diabetic kidney complication: Secondary | ICD-10-CM

## 2022-11-18 MED ORDER — ACCU-CHEK SOFTCLIX LANCETS MISC
12 refills | Status: AC
Start: 2022-11-18 — End: ?

## 2022-11-22 ENCOUNTER — Other Ambulatory Visit: Payer: Medicaid Other

## 2022-11-22 NOTE — Patient Outreach (Signed)
Medicaid Managed Care Social Work Note  11/22/2022 Name:  Molly Howard MRN:  [id number] DOB:  February 23, 1986  Molly Howard is an 37 y.o. year old female who is a primary patient of Molly Mail, DO.  The [facility] Managed Care Coordination team was consulted for assistance with:  Food Insecurity  Molly Howard was given information about Medicaid Managed Care Coordination team services today. Molly Howard Patient agreed to services and verbal consent obtained.  Engaged with patient  for by telephone forinitial visit in response to referral for case management and/or care coordination services.   Assessments/Interventions:  Review of past medical history, allergies, medications, health status, including review of consultants reports, laboratory and other test data, was performed as part of comprehensive evaluation and provision of chronic care management services.  SDOH: (Social Determinant of Health) assessments and interventions performed: SDOH Interventions    Flowsheet Row Telephone from 06/30/2020 in [facility] Telephone from 06/16/2020 in [facility]  SDOH Interventions    Food Insecurity Interventions -- Other (Comment)  [will help with transportation to get to food pantries as that was the main barrier]  Housing Interventions -- Other (Comment)  [referred to [facility] to apply for low income water assistance program]  Transportation Interventions [employer] Strain Interventions -- Other (Comment)  [providing [employer] gift cards to help with medication costs]     BSW completed a telephone outreach with patient she states she needs assistance with food. She states she has applied for foodstamps but has already used her 3 months. She does not have any income and is waiting on disability. BSW offered to send patient food pantries but states she does not have transportation. BSW will  place a referral for [employer].   Advanced Directives Status:  Not addressed in this encounter.  Care Plan                 Allergies  Allergen Reactions   No [employer] Food Allergies Rash and Other (See Comments)    Lemons    Medications Reviewed Today     Reviewed by Molly Mail, DO (Physician) on 11/16/22 at 1402  Med List Status: <None>   Medication Order Taking? Sig Documenting Provider Last Dose Status Informant  atorvastatin (LIPITOR) 20 MG tablet [id number] Yes Take 1 tablet (20 mg total) by mouth daily. [name], MD Taking Active   calcium carbonate (OS-CAL - DOSED IN MG OF ELEMENTAL CALCIUM) 1250 (500 Ca) MG tablet [id number] Yes Take 1 tablet (1,250 mg total) by mouth 3 (three) times daily with meals. [name], MD Taking Active Self  carvedilol (COREG) 3.125 MG tablet [id number] Yes Take 1 tablet (3.125 mg total) by mouth 2 (two) times daily with a meal. [name], MD Taking Active   dapagliflozin propanediol (FARXIGA) 10 MG TABS tablet [id number] Yes Take 1 tablet (10 mg total) by mouth once daily. [name], MD Taking Active   digoxin (LANOXIN) 0.125 MG tablet [id number] Yes Take 1 tablet (0.125 mg total) by mouth daily. [name], MD Taking Active   ferrous sulfate 325 (65 FE) MG tablet [id number] Yes Take 1 tablet (325 mg total) by mouth daily with breakfast. [name], MD Taking Active   insulin glargine (LANTUS) 100 UNIT/ML Solostar Pen [id number] Yes Inject 15 Units into the skin 2 (two) times daily. [name], MD Taking Active   nitroGLYCERIN (NITROSTAT) 0.4 MG SL tablet [id number] Yes Place  1 tablet (0.4 mg total) under the tongue every 5 (five) minutes as needed for chest pain. [name], MD Taking Active     Discontinued 10/18/20 1034 (Reorder) sacubitril-valsartan (ENTRESTO) 24-26 MG [id number] Yes Take 1 tablet by mouth 2 (two) times daily. [name], MD Taking Active   torsemide [facility]) 20 MG tablet [id number] Yes Take 2 tablets (40 mg  total) by mouth in the morning and at bedtime. [name], MD Taking Active   Med List Note Erasmo Leventhal 11/27/20 8657): Patient states she has been out of her medications for "a while now". It is due to financial limitations.            Patient Active Problem List   Diagnosis Date Noted   Cardiogenic shock (HCC) 11/14/2022   Bilateral leg edema 11/06/2022   NICM (nonischemic cardiomyopathy) (HCC) 07/31/2022   Hypocalcemia 07/25/2022   Leg edema 07/25/2022   Acute on chronic combined systolic and diastolic CHF (congestive heart failure) (HCC) 07/25/2022   GERD without esophagitis 07/24/2022   Cocaine abuse (HCC) 02/12/2022   Acute pulmonary edema (HCC)    Syncope 02/09/2022   Menorrhagia with regular cycle 06/22/2021   Dyslipidemia 11/27/2020   Chronic kidney disease (CKD), stage II (mild) 11/27/2020   Hypomagnesemia 10/17/2020   Dyspnea 05/21/2020   Essential hypertension 05/20/2020   Diabetes mellitus, type II (HCC) 05/20/2020   Chronic anemia 05/20/2020   Reactive thrombocytosis 05/20/2020   Obesity (BMI 30-39.9) 05/20/2020   Chronic combined systolic and diastolic heart failure (HCC) 11/01/2019   Financial difficulties 11/01/2019   Medication management 11/01/2019   Lung nodule 11/01/2019   Nonischemic cardiomyopathy (HCC) 11/01/2019   Polysubstance abuse (HCC) 11/01/2019   Tobacco use 11/01/2019   Swelling     Conditions to be addressed/monitored per PCP order:   food   There are no care plans that you recently modified to display for this patient.   Follow up:  Patient agrees to Care Plan and Follow-up.  Plan: The Managed Medicaid care management team will reach out to the patient again over the next 30 days.  Date/time of next scheduled Social Work care management/care coordination outreach:  12/22/22  Molly Howard, Molly Howard, [facility]  Managed [facility] Social Worker [phone]

## 2022-11-22 NOTE — Patient Instructions (Signed)
Visit Information  Molly Howard was given information about Medicaid Managed Care team care coordination services as a part of their [facility] Medicaid benefit. [name] verbally consented to engagement with the [facility] Managed Care team.   If you are experiencing a medical emergency, please call 911 or report to your local emergency department or urgent care.   If you have a non-emergency medical problem during routine business hours, please contact your provider's office and ask to speak with a nurse.   For questions related to your [facility], please call: [phone] or visit the homepage here: [email or web address]  If you would like to schedule transportation through your [facility], please call the following number at least 2 days in advance of your appointment: [phone]   Rides for urgent appointments can also be made after hours by calling Member Services.  Call the Behavioral Health Crisis Line at [phone], at any time, 24 hours a day, 7 days a week. If you are in danger or need immediate medical attention call 911.  If you would like help to quit smoking, call 1-800-QUIT-NOW (806-455-4207) OR Espaol: 1-855-Djelo-Ya (0-272-536-6440) o para ms informacin haga clic aqu or Text READY to 347-425 to register via text  [name] - following are the goals we discussed in your visit today:   Goals Addressed   None       Social Worker will follow up in 30 days.   Gus Puma, Kenard Gower, MHA [facility]  Managed Medicaid Social Worker [phone]   Following is a copy of your plan of care:  There are no care plans that you recently modified to display for this patient.

## 2022-11-23 ENCOUNTER — Other Ambulatory Visit: Payer: Self-pay

## 2022-11-24 ENCOUNTER — Encounter: Payer: Self-pay | Admitting: *Deleted

## 2022-11-24 ENCOUNTER — Other Ambulatory Visit: Payer: Medicaid Other | Admitting: *Deleted

## 2022-11-24 NOTE — Patient Instructions (Signed)
Visit Information  [name] was given information about Medicaid Managed Care team care coordination services as a part of their [facility] Community Plan Medicaid benefit. Molly Howard verbally consented to engagement with the [facility] Managed Care team.   If you are experiencing a medical emergency, please call 911 or report to your local emergency department or urgent care.   If you have a non-emergency medical problem during routine business hours, please contact your provider's office and ask to speak with a nurse.   For questions related to your [facility], please call: [phone] or visit the homepage here: [email or web address]  If you would like to schedule transportation through your [facility], please call the following number at least 2 days in advance of your appointment: [phone]   Rides for urgent appointments can also be made after hours by calling Member Services.  Call the Behavioral Health Crisis Line at [phone], at any time, 24 hours a day, 7 days a week. If you are in danger or need immediate medical attention call 911.  If you would like help to quit smoking, call 1-800-QUIT-NOW ((647)499-9508) OR Espaol: 1-855-Djelo-Ya (1-324-401-0272) o para ms informacin haga clic aqu or Text READY to 536-644 to register via text  Molly Howard,   Please see education materials related to diabetes provided as Financial risk analyst.   The patient verbalized understanding of instructions, educational materials, and care plan provided today and agreed to receive a mailed copy of patient instructions, educational materials, and care plan.   Telephone follow up appointment with Managed Medicaid care management team member scheduled for:12/27/22 @ 2:30 pm  Molly Emms RN, BSN [place]  Managed [facility] RN Care Coordinator [phone]   Following is a copy of  your plan of care:  Care Plan : RN Care Manager Plan of Care  Updates made by Molly Dach, RN since 11/24/2022 12:00 AM     Problem: Health Management needs related to DMII      Long-Range Goal: Development of Plan of Care to address Health Management needs related to DMII   Start Date: 11/24/2022  Expected End Date: 02/22/2023  Note:   Current Barriers:  Chronic Disease Management support and education needs related to DMII  RNCM Clinical Goal(s):  Patient will verbalize understanding of plan for management of DMII as evidenced by patient reports take all medications exactly as prescribed and will call provider for medication related questions as evidenced by patient reports    attend all scheduled medical appointments: 12/14/22 with PCP and 12/21/22 with Cardiology as evidenced by provider documentation in EMR        continue to work with RN Care Manager and/or Social Worker to address care management and care coordination needs related to DMII as evidenced by adherence to CM Team Scheduled appointments     work with Child psychotherapist to address Financial constraints related to no income and Limited access to food related to the management of DMII as evidenced by review of EMR and patient or Child psychotherapist report     through collaboration with Medical illustrator, provider, and care team.   Interventions: Inter-disciplinary care team collaboration (see longitudinal plan of care) Evaluation of current treatment plan related to  self management and patient's adherence to plan as established by provider   Diabetes:  (Status: New goal.) Long Term Goal   Lab Results  Component Value Date   HGBA1C 11.7 (H) 11/05/2022   @ Assessed patient's  understanding of A1c goal: <7% Provided education to patient about basic DM disease process; Reviewed medications with patient and discussed importance of medication adherence;        Reviewed prescribed diet with patient MyPlate method, decrease  carbohydrates, sweets and sweet drinks; Discussed plans with patient for ongoing care management follow up and provided patient with direct contact information for care management team;      Reviewed scheduled/upcoming provider appointments including: 12/14/22 with Dr. Caralee Ates and 12/21/22 with Dr. Shea Evans ;         Review of patient status, including review of consultants reports, relevant laboratory and other test results, and medications completed;       Assessed social determinant of health barriers;        Provided patient with [facility] transportation [phone], advised to call today to arrange transportation on 8/13 and 8/20, address and phone number provided Advised patient to contact [facility] member services [phone] for member benefitss(fresh fruit and vegetables) Provided patient with [facility] [phone] to call and schedule a diabetic eye exam Collaborate with Molly Howard regarding patient needing assistance with disability application  Patient Goals/Self-Care Activities: Take medications as prescribed   Attend all scheduled provider appointments Call provider office for new concerns or questions  Work with the social worker to address care coordination needs and will continue to work with the clinical team to address health care and disease management related needs schedule appointment with eye doctor drink 6 to 8 glasses of water each day fill half of plate with vegetables limit fast food meals to no more than 1 per week

## 2022-11-24 NOTE — Patient Outreach (Signed)
Medicaid Managed Care   Nurse Care Manager Note  11/24/2022 Name:  Molly Howard MRN:  [id number] DOB:  04/29/1986  Molly Howard is an 37 y.o. year old female who is a primary patient of Margarita Mail, DO.  The [facility] Managed Care Coordination team was consulted for assistance with:    DMII  [name] was given information about Medicaid Managed Care Coordination team services today. Molly Howard Patient agreed to services and verbal consent obtained.  Engaged with patient by telephone for initial visit in response to provider referral for case management and/or care coordination services.   Assessments/Interventions:  Review of past medical history, allergies, medications, health status, including review of consultants reports, laboratory and other test data, was performed as part of comprehensive evaluation and provision of chronic care management services.  SDOH (Social Determinants of Health) assessments and interventions performed: SDOH Interventions    Flowsheet Row Patient Outreach Telephone from 11/24/2022 in [place] POPULATION HEALTH DEPARTMENT Telephone from 06/30/2020 in [facility] [place] Telephone from 06/16/2020 in [facility]  SDOH Interventions     Food Insecurity Interventions Other (Comment)  [working with BSW for food assistance] -- Other (Comment)  [will help with transportation to get to food pantries as that was the main barrier]  Housing Interventions Intervention Not Indicated -- Other (Comment)  [referred to [employer] to apply for low income water assistance program]  Transportation Interventions Payor Benefit  [Provided with [employer] transportation [phone] [employer]  Utilities Interventions Intervention Not Indicated -- --  Financial Strain Interventions -- -- Other (Comment)  [providing [employer] gift cards to help with medication costs]       Care  Plan  Allergies  Allergen Reactions   No Healthtouch Food Allergies Rash and Other (See Comments)    Lemons    Medications Reviewed Today     Reviewed by Heidi Dach, RN (Registered Nurse) on 11/24/22 at 1248  Med List Status: <None>   Medication Order Taking? Sig Documenting Provider Last Dose Status Informant  Accu-Chek Softclix Lancets lancets [id number] Yes Use as instructed Margarita Mail, DO Taking Active   atorvastatin (LIPITOR) 20 MG tablet [id number] Yes Take 1 tablet (20 mg total) by mouth daily. [name], MD Taking Active   calcium carbonate (OS-CAL - DOSED IN MG OF ELEMENTAL CALCIUM) 1250 (500 Ca) MG tablet [id number] Yes Take 1 tablet (1,250 mg total) by mouth 3 (three) times daily with meals. [name], MD Taking Active Self  carvedilol (COREG) 3.125 MG tablet [id number] Yes Take 1 tablet (3.125 mg total) by mouth 2 (two) times daily with a meal. [name], MD Taking Active   dapagliflozin propanediol (FARXIGA) 10 MG TABS tablet [id number] Yes Take 1 tablet (10 mg total) by mouth once daily. [name], MD Taking Active   digoxin (LANOXIN) 0.125 MG tablet [id number] Yes Take 1 tablet (0.125 mg total) by mouth daily. [name], MD Taking Active   ferrous sulfate 325 (65 FE) MG tablet [id number] Yes Take 1 tablet (325 mg total) by mouth daily with breakfast. [name], MD Taking Active   insulin glargine (LANTUS) 100 UNIT/ML Solostar Pen [id number] Yes Inject 15 Units into the skin 2 (two) times daily. [name], MD Taking Active   nitroGLYCERIN (NITROSTAT) 0.4 MG SL tablet [id number] Yes Place 1 tablet (0.4 mg total) under the tongue every 5 (five) minutes as needed for chest pain. [name], MD Taking Active  Discontinued 10/18/20 1034 (Reorder) sacubitril-valsartan (ENTRESTO) 24-26 MG [id number] Yes Take 1 tablet by mouth 2 (two) times daily. [name], MD Taking Active   torsemide [facility]) 20 MG tablet [id number] Yes Take 2 tablets (40 mg total) by mouth in  the morning and at bedtime. [name], MD Taking Active   Med List Note Erasmo Leventhal 11/27/20 3086): Patient states she has been out of her medications for "a while now". It is due to financial limitations.            Patient Active Problem List   Diagnosis Date Noted   Cardiogenic shock (HCC) 11/14/2022   Bilateral leg edema 11/06/2022   NICM (nonischemic cardiomyopathy) (HCC) 07/31/2022   Hypocalcemia 07/25/2022   Leg edema 07/25/2022   Acute on chronic combined systolic and diastolic CHF (congestive heart failure) (HCC) 07/25/2022   GERD without esophagitis 07/24/2022   Cocaine abuse (HCC) 02/12/2022   Acute pulmonary edema (HCC)    Syncope 02/09/2022   Menorrhagia with regular cycle 06/22/2021   Dyslipidemia 11/27/2020   Chronic kidney disease (CKD), stage II (mild) 11/27/2020   Hypomagnesemia 10/17/2020   Dyspnea 05/21/2020   Essential hypertension 05/20/2020   Diabetes mellitus, type II (HCC) 05/20/2020   Chronic anemia 05/20/2020   Reactive thrombocytosis 05/20/2020   Obesity (BMI 30-39.9) 05/20/2020   Chronic combined systolic and diastolic heart failure (HCC) 11/01/2019   Financial difficulties 11/01/2019   Medication management 11/01/2019   Lung nodule 11/01/2019   Nonischemic cardiomyopathy (HCC) 11/01/2019   Polysubstance abuse (HCC) 11/01/2019   Tobacco use 11/01/2019   Swelling     Conditions to be addressed/monitored per PCP order:  DMII  Care Plan : RN Care Manager Plan of Care  Updates made by Heidi Dach, RN since 11/24/2022 12:00 AM     Problem: Health Management needs related to DMII      Long-Range Goal: Development of Plan of Care to address Health Management needs related to DMII   Start Date: 11/24/2022  Expected End Date: 02/22/2023  Note:   Current Barriers:  Chronic Disease Management support and education needs related to DMII  RNCM Clinical Goal(s):  Patient will verbalize understanding of plan for management of  DMII as evidenced by patient reports take all medications exactly as prescribed and will call provider for medication related questions as evidenced by patient reports    attend all scheduled medical appointments: 12/14/22 with PCP and 12/21/22 with Cardiology as evidenced by provider documentation in EMR        continue to work with RN Care Manager and/or Social Worker to address care management and care coordination needs related to DMII as evidenced by adherence to CM Team Scheduled appointments     work with Child psychotherapist to address Financial constraints related to no income and Limited access to food related to the management of DMII as evidenced by review of EMR and patient or Child psychotherapist report     through collaboration with Medical illustrator, provider, and care team.   Interventions: Inter-disciplinary care team collaboration (see longitudinal plan of care) Evaluation of current treatment plan related to  self management and patient's adherence to plan as established by provider   Diabetes:  (Status: New goal.) Long Term Goal   Lab Results  Component Value Date   HGBA1C 11.7 (H) 11/05/2022   @ Assessed patient's understanding of A1c goal: <7% Provided education to patient about basic DM disease process; Reviewed medications with patient and discussed importance of  medication adherence;        Reviewed prescribed diet with patient MyPlate method, decrease carbohydrates, sweets and sweet drinks; Discussed plans with patient for ongoing care management follow up and provided patient with direct contact information for care management team;      Reviewed scheduled/upcoming provider appointments including: 12/14/22 with Dr. Caralee Ates and 12/21/22 with Dr. Shea Evans ;         Review of patient status, including review of consultants reports, relevant laboratory and other test results, and medications completed;       Assessed social determinant of health barriers;        Provided patient with [facility]  transportation [phone], advised to call today to arrange transportation on 8/13 and 8/20, address and phone number provided Advised patient to contact [facility] member services [phone] for member benefitss(fresh fruit and vegetables) Provided patient with [facility] [phone] to call and schedule a diabetic eye exam Collaborate with Jon Gills regarding patient needing assistance with disability application  Patient Goals/Self-Care Activities: Take medications as prescribed   Attend all scheduled provider appointments Call provider office for new concerns or questions  Work with the social worker to address care coordination needs and will continue to work with the clinical team to address health care and disease management related needs schedule appointment with eye doctor drink 6 to 8 glasses of water each day fill half of plate with vegetables limit fast food meals to no more than 1 per week       Follow Up:  Patient agrees to Care Plan and Follow-up.  Plan: The Managed Medicaid care management team will reach out to the patient again over the next 30 days.  Date/time of next scheduled RN care management/care coordination outreach:  12/27/22 @ 2:30pm  Estanislado Emms RN, BSN [place]  Managed [facility] RN Care Coordinator [phone]

## 2022-11-25 ENCOUNTER — Other Ambulatory Visit: Payer: Medicaid Other | Admitting: Licensed Clinical Social Worker

## 2022-11-25 NOTE — Patient Instructions (Signed)
Visit Information  Molly Howard was given information about Medicaid Managed Care team care coordination services as a part of their [facility] Community Plan Medicaid benefit. Molly Howard verbally consented to engagement with the [facility] Managed Care team.   If you are experiencing a medical emergency, please call 911 or report to your local emergency department or urgent care.   If you have a non-emergency medical problem during routine business hours, please contact your provider's office and ask to speak with a nurse.   For questions related to your [facility], please call: [phone] or visit the homepage here: [email or web address]  If you would like to schedule transportation through your [facility], please call the following number at least 2 days in advance of your appointment: [phone]   Rides for urgent appointments can also be made after hours by calling Member Services.  Call the Behavioral Health Crisis Line at [phone], at any time, 24 hours a day, 7 days a week. If you are in danger or need immediate medical attention call 911.  If you would like help to quit smoking, call 1-800-QUIT-NOW (479-736-8321) OR Espaol: 1-855-Djelo-Ya (1-324-401-0272) o para ms informacin haga clic aqu or Text READY to 536-644 to register via text     24- Hour Availability:    [facility]  [address] [place], [place] [phone] Crisis [phone]   [facility] Service [facility] the [facility] [phone]  [place] Crisis Service  [phone]    [facility]  [phone] (after hours)   Therapeutic Alternative/Mobile Crisis   [phone]   [place] National Suicide Hotline  540-009-3617 Len Childs) [place] [address]   Call [phone] for mental health emergencies   [facility]  [phone]);  [place] and  [employer]  [phone]); [place], [place], [place], [address], [place], [place], [place]    [place] [facility] Residents For 24/7 walk-up access to mental health services for [facility] children (4+), adolescents and adults, please visit the [facility] located at [address] in [place], [place].  *[place] also provides comprehensive outpatient behavioral health services in a variety of locations around the [place].  Connect With [place] [address] [place], [place] [address] HelpLine: 8307654369 or 1-(613)473-7854  Get Directions  Find Help 24/7 By Phone Call our 24-hour HelpLine at (563)812-9529 or 214-453-2909 for immediate assistance for mental health and substance abuse issues.  Walk-In Help [place]: [facility] (Ages 4 and Up) [place]: Emergency Dept., [facility] Additional Resources National Hopeline Network: 1-800-SUICIDE The National Suicide Prevention Lifeline: 678-482-1551      Call me if you change your mind and wish to gain mental health support and resource connection! Molly Howard, BSW, MSW, [employer] Managed Medicaid LCSW [facility]  [facility] [email or web address] Phone: [phone]

## 2022-11-25 NOTE — Patient Outreach (Signed)
Medicaid Managed Care Social Work Note  11/25/2022 Name:  Molly Howard MRN:  [id number] DOB:  1985/06/13  Molly Howard is an 37 y.o. year old female who is a primary patient of Margarita Mail, DO.  The Medicaid Managed Care Coordination team was consulted for assistance with:  Mental Health Counseling and Resources  [name] was given information about Medicaid Managed Care Coordination team services today. Molly Howard Patient agreed to services and verbal consent obtained.  Engaged with patient  for by telephone forinitial visit in response to referral for case management and/or care coordination services.   Assessments/Interventions:  Review of past medical history, allergies, medications, health status, including review of consultants reports, laboratory and other test data, was performed as part of comprehensive evaluation and provision of chronic care management services.  SDOH: (Social Determinant of Health) assessments and interventions performed: SDOH Interventions    Flowsheet Row Patient Outreach Telephone from 11/24/2022 in [place] POPULATION HEALTH DEPARTMENT Telephone from 06/30/2020 in [facility] [place] Telephone from 06/16/2020 in [facility] Specialty Clinics  SDOH Interventions     Food Insecurity Interventions Other (Comment)  [working with BSW for food assistance] -- Other (Comment)  [will help with transportation to get to food pantries as that was the main barrier]  Housing Interventions Intervention Not Indicated -- Other (Comment)  [referred to [employer] to apply for low income water assistance program]  Transportation Interventions Payor Benefit  [Provided with [employer] transportation [phone] [employer]  Utilities Interventions Intervention Not Indicated -- --  Financial Strain Interventions -- -- Other (Comment)  [providing [employer] gift cards to help with medication costs]        Advanced Directives Status:  Not addressed in this encounter.  Care Plan                 Allergies  Allergen Reactions   No Healthtouch Food Allergies Rash and Other (See Comments)    Lemons    Medications Reviewed Today   Medications were not reviewed in this encounter     Patient Active Problem List   Diagnosis Date Noted   Cardiogenic shock (HCC) 11/14/2022   Bilateral leg edema 11/06/2022   NICM (nonischemic cardiomyopathy) (HCC) 07/31/2022   Hypocalcemia 07/25/2022   Leg edema 07/25/2022   Acute on chronic combined systolic and diastolic CHF (congestive heart failure) (HCC) 07/25/2022   GERD without esophagitis 07/24/2022   Cocaine abuse (HCC) 02/12/2022   Acute pulmonary edema (HCC)    Syncope 02/09/2022   Menorrhagia with regular cycle 06/22/2021   Dyslipidemia 11/27/2020   Chronic kidney disease (CKD), stage II (mild) 11/27/2020   Hypomagnesemia 10/17/2020   Dyspnea 05/21/2020   Essential hypertension 05/20/2020   Diabetes mellitus, type II (HCC) 05/20/2020   Chronic anemia 05/20/2020   Reactive thrombocytosis 05/20/2020   Obesity (BMI 30-39.9) 05/20/2020   Chronic combined systolic and diastolic heart failure (HCC) 11/01/2019   Financial difficulties 11/01/2019   Medication management 11/01/2019   Lung nodule 11/01/2019   Nonischemic cardiomyopathy (HCC) 11/01/2019   Polysubstance abuse (HCC) 11/01/2019   Tobacco use 11/01/2019   Swelling    [facility] LCSW completed initial outreach to patient on 11/25/22. Patient was very appreciative of resource information provided on mental health support but reports that she does not wish to pursue substance abuse or mental health treatment at this time but will notify her PCP if this changes. She was provided crisis resource education in her area  and patient agreed to save the number [phone] in her phone in case of emergencies. [facility] LCSW will sign off at this time and update [facility] team who remain involved with patient.       24- Hour Availability:    [facility]  [address] [place], [place] [phone] Crisis [phone]   [facility] [phone]  [place] Crisis Service  [phone]    [facility]  [phone] (after hours)   Therapeutic Alternative/Mobile Crisis   [phone]   [place] National Suicide Hotline  862-847-7421 Len Childs) [place] 884   Call [phone] for mental health emergencies   [facility]  [phone]);  [place] and [employer]  [phone]); [place], [place], [place], [place], [place], [place], [place]    [place] [facility] Urgent Care for [facility] Residents For 24/7 walk-up access to mental health services for [facility] children (4+), adolescents and adults, please visit the [facility] located at [address] in [place], [place].  *[place] also provides comprehensive outpatient behavioral health services in a variety of locations around the [place].  Connect With [place] [address] [place], [place] [address] HelpLine: (870) 082-8675 or 1-779 169 0804  Get Directions  Find Help 24/7 By Phone Call our 24-hour HelpLine at 330-400-8524 or 667-441-1095 for immediate assistance for mental health and substance abuse issues.  Walk-In Help [place]: [facility] (Ages 4 and Up) [place]: Emergency Dept., [facility] Additional Resources National Hopeline Network: 1-800-SUICIDE The National Suicide Prevention Lifeline: 1-800-273-TALK     Follow up:  Patient requests no follow-up at this time.  Plan: The Managed Medicaid care management team will reach out to the patient again over the next 30 days.  Dickie La, BSW, MSW, [employer] Managed Medicaid LCSW [facility]  [facility] [email or web address] Phone: [phone]

## 2022-11-26 ENCOUNTER — Other Ambulatory Visit: Payer: Self-pay | Admitting: Internal Medicine

## 2022-11-26 ENCOUNTER — Other Ambulatory Visit: Payer: Self-pay

## 2022-11-26 DIAGNOSIS — E1129 Type 2 diabetes mellitus with other diabetic kidney complication: Secondary | ICD-10-CM

## 2022-11-26 DIAGNOSIS — H2513 Age-related nuclear cataract, bilateral: Secondary | ICD-10-CM | POA: Diagnosis not present

## 2022-11-26 DIAGNOSIS — E113393 Type 2 diabetes mellitus with moderate nonproliferative diabetic retinopathy without macular edema, bilateral: Secondary | ICD-10-CM | POA: Diagnosis not present

## 2022-11-26 DIAGNOSIS — H348322 Tributary (branch) retinal vein occlusion, left eye, stable: Secondary | ICD-10-CM | POA: Diagnosis not present

## 2022-11-26 DIAGNOSIS — Z794 Long term (current) use of insulin: Secondary | ICD-10-CM

## 2022-11-26 LAB — HM DIABETES EYE EXAM

## 2022-11-26 NOTE — Telephone Encounter (Signed)
Requested medication (s) are due for refill today: Yes  Requested medication (s) are on the active medication list: No  Last refill:  07/16/21  Future visit scheduled: Yes  Notes to clinic:  Not on current medication list. D/C 02/09/22     Requested Prescriptions  Pending Prescriptions Disp Refills   GLOBAL EASE INJECT PEN NEEDLES 31G X 5 MM MISC [Pharmacy Med Name: GLOBAL EASE INJECT PEN NEEDLES 31G] 100 each 3    Sig: USE TWICE DAILY     Endocrinology: Diabetes - Testing Supplies Passed - 11/26/2022  4:03 PM      Passed - Valid encounter within last 12 months    Recent Outpatient Visits           1 week ago Hospital discharge follow-up   [facility] Margarita Mail, DO   1 month ago HFrEF (heart failure with reduced ejection fraction) [facility])   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

## 2022-11-26 NOTE — Telephone Encounter (Signed)
Medication Refill - Medication:  Accu-Chek Softclix Lancets lancets  Has the patient contacted their pharmacy? No. Patient stated she would call after she got off the phone with me  Preferred Pharmacy (with phone number or street name): [facility] - [place], [place] - [address]  Phone: [phone] Fax: [phone]  Has the patient been seen for an appointment in the last year OR does the patient have an upcoming appointment? Yes.  F/U on 12/14/2022

## 2022-11-26 NOTE — Telephone Encounter (Signed)
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, unspecified whether long term insulin use [facility])   [facility] Margarita Mail, DO   1 year ago 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

## 2022-12-09 NOTE — Progress Notes (Signed)
Established Patient Office Visit  Subjective:  Patient ID: Molly Howard, female    DOB: Jul 03, 1985  Age: 37 y.o. MRN: [id number]  CC:  Chief Complaint  Patient presents with   Follow-up    HPI [name] presents for follow up.   Hypertension/HFrEF/NICM: -Medications: Torsemide 40 mg BID, Carvedilol 3.125 mg BID, Entresto, Farxiga 10 mg, Digoxin 0.125 mg  -Has been compliant with her medications for the last month and doing well overall -Follows with Cardiology note reviewed from 01/15/22 but not since, has an appointment next week -Last echo 07/25/22 showing reduced EF at 25-30% with global hypokinesis of the left ventricle   Diabetes Type 2: -Last A1c 7/24 11.7% -Medications: Fargixa 10mg , Lantus changed to 15 units in the am, 15 units in the pm  -Patient states she is general compliant with the above medications and reports no side effects.  -Checking BG at home: fasting 250. Post-prandial 140 -Eye exam: Up-to-date 7/24 -Foot exam: Up-to-date 7/24 -Microalbumin: UTD 6/24 -Statin: yes  -PNA vaccine: Prevnar 23 in 7/22 -Denies symptoms of hypoglycemia, polyuria, polydipsia, numbness extremities, foot ulcers/trauma.   HLD: -Medications: Lipitor 20 mg  -Patient is compliant with above medications and reports no side effects.  -Last lipid panel: Lipid Panel     Component Value Date/Time   CHOL 216 (H) 05/27/2022 1022   TRIG 124 05/27/2022 1022   HDL 46 05/27/2022 1022   CHOLHDL 4.7 05/27/2022 1022   VLDL 25 05/27/2022 1022   LDLCALC 145 (H) 05/27/2022 1022   LDLCALC 171 (H) 06/12/2021 1354   GERD: -Not currently on daily medications, taking Pepcid as needed   CKD/AoCD: -Last creatinine 11/16/2022 1.66 -Last hgb 10.2 11/08/22    Past Medical History:  Diagnosis Date   Acid reflux    Chronic HFrEF (heart failure with reduced ejection fraction) (HCC)    a. 10/2019 Echo: EF 35-40%, GrII DD; b. 05/2020 Echo: EF 20-25%, glob HK; c. 10/2020 Echo: EF 30-35%, glob HK.  GrII DD, Mildly red RV fxn. Mod TR; d.  05/2021 cMRI: EF 42%, no LGE. Nl RV size/fxn.   CKD (chronic kidney disease), stage II    Diabetes mellitus (HCC)    H/O medication noncompliance    Hypertension    Microcytic anemia    NICM (nonischemic cardiomyopathy) (HCC)    a. 10/2019 Echo: EF 35-40%; b. 10/2019 MV: No ischemia. Small apical defect-->breast attenuation; c. 05/2020 Echo: EF 20-25%; d. 10/2020 Echo: EF 30-35%, glob HK. GrII DD, Mildly red RV fxn. Mod TR; e. 05/2021 cMRI: EF 42%, no LGE. Nl RV size/fxn.   Obesity    Polysubstance abuse [facility])     Past Surgical History:  Procedure Laterality Date   CHOLECYSTECTOMY     HERNIA REPAIR     RIGHT HEART CATH N/A 07/28/2022   Procedure: RIGHT HEART CATH;  Surgeon: Iran Ouch, MD;  Location: [facility] INVASIVE CV LAB;  Service: Cardiovascular;  Laterality: N/A;    Family History  Problem Relation Age of Onset   Heart failure Mother        Onset of heart failure 40s.  Died in May 03, 2022.   Diabetes Mother    Hypertension Father    Diabetes Father     Social History   Socioeconomic History   Marital status: Single    Spouse name: Not on file   Number of children: Not on file   Years of education: Not on file   Highest education level: Not on file  Occupational History   Not on file  Tobacco Use   Smoking status: Former    Current packs/day: 0.00    Types: Cigarettes    Quit date: 2022    Years since quitting: 2.6   Smokeless tobacco: Never  Vaping Use   Vaping status: Never Used  Substance and Sexual Activity   Alcohol use: Not Currently    Comment: ~ 4 shots/day on weekends only   Drug use: Not Currently    Types: Cocaine    Comment: Admits to using cocaine up and will February 2024.   Sexual activity: Not Currently    Birth control/protection: None  Other Topics Concern   Not on file  Social History Narrative   Lives locally.  Does not routinely exercise.  Has been using cocaine.   Social Determinants of  Health   Financial Resource Strain: High Risk (06/16/2020)   Overall Financial Resource Strain (CARDIA)    Difficulty of Paying Living Expenses: Hard  Food Insecurity: Food Insecurity Present (11/24/2022)   Hunger Vital Sign    Worried About Running Out of Food in the Last Year: Sometimes true    Ran Out of Food in the Last Year: Sometimes true  Transportation Needs: Unmet Transportation Needs (11/24/2022)   PRAPARE - Administrator, Civil Service (Medical): Yes    Lack of Transportation (Non-Medical): Yes  Physical Activity: Not on file  Stress: Not on file  Social Connections: Not on file  Intimate Partner Violence: Not At Risk (11/06/2022)   Humiliation, Afraid, Rape, and Kick questionnaire    Fear of Current or Ex-Partner: No    Emotionally Abused: No    Physically Abused: No    Sexually Abused: No    Outpatient Medications Prior to Visit  Medication Sig Dispense Refill   Accu-Chek Softclix Lancets lancets Use as instructed 100 each 12   atorvastatin (LIPITOR) 20 MG tablet Take 1 tablet (20 mg total) by mouth daily. 90 tablet 3   calcium carbonate (OS-CAL - DOSED IN MG OF ELEMENTAL CALCIUM) 1250 (500 Ca) MG tablet Take 1 tablet (1,250 mg total) by mouth 3 (three) times daily with meals. 60 tablet 0   carvedilol (COREG) 3.125 MG tablet Take 1 tablet (3.125 mg total) by mouth 2 (two) times daily with a meal. 60 tablet 2   dapagliflozin propanediol (FARXIGA) 10 MG TABS tablet Take 1 tablet (10 mg total) by mouth once daily. 90 tablet 3   digoxin (LANOXIN) 0.125 MG tablet Take 1 tablet (0.125 mg total) by mouth daily. 30 tablet 3   ferrous sulfate 325 (65 FE) MG tablet Take 1 tablet (325 mg total) by mouth daily with breakfast. 30 tablet 3   insulin glargine (LANTUS) 100 UNIT/ML Solostar Pen Inject 15 Units into the skin 2 (two) times daily. 15 mL 11   nitroGLYCERIN (NITROSTAT) 0.4 MG SL tablet Place 1 tablet (0.4 mg total) under the tongue every 5 (five) minutes as needed for  chest pain. 25 tablet 12   sacubitril-valsartan (ENTRESTO) 24-26 MG Take 1 tablet by mouth 2 (two) times daily. 60 tablet 2   torsemide (DEMADEX) 20 MG tablet Take 2 tablets (40 mg total) by mouth in the morning and at bedtime. 120 tablet 2   No facility-administered medications prior to visit.    Allergies  Allergen Reactions   No [employer] Food Allergies Rash and Other (See Comments)    Lemons    ROS Review of Systems  Constitutional:  Negative for chills  and fever.  Respiratory:  Negative for cough and shortness of breath.   Cardiovascular:  Negative for chest pain and leg swelling.  Gastrointestinal:  Negative for diarrhea, nausea and vomiting.  Skin: Negative.       Objective:    Physical Exam Constitutional:      Appearance: Normal appearance.  HENT:     Head: Normocephalic and atraumatic.  Eyes:     Conjunctiva/sclera: Conjunctivae normal.  Cardiovascular:     Rate and Rhythm: Normal rate and regular rhythm.     Pulses:          Dorsalis pedis pulses are 2+ on the right side and 2+ on the left side.  Pulmonary:     Effort: Pulmonary effort is normal.     Breath sounds: Normal breath sounds. No wheezing, rhonchi or rales.  Musculoskeletal:     Right lower leg: No edema.     Left lower leg: No edema.     Right foot: Normal range of motion. No deformity, bunion, Charcot foot, foot drop or prominent metatarsal heads.     Left foot: Normal range of motion. No deformity, bunion, Charcot foot, foot drop or prominent metatarsal heads.  Feet:     Right foot:     Protective Sensation: 6 sites tested.  6 sites sensed.     Skin integrity: Skin integrity normal.     Toenail Condition: Right toenails are abnormally thick.     Left foot:     Protective Sensation: 6 sites tested.  6 sites sensed.     Skin integrity: Skin integrity normal.     Toenail Condition: Left toenails are abnormally thick.  Skin:    General: Skin is warm and dry.  Neurological:     General: No  focal deficit present.     Mental Status: She is alert. Mental status is at baseline.  Psychiatric:        Mood and Affect: Mood normal.        Behavior: Behavior normal.     BP 124/72   Pulse 74   Temp 98 F (36.7 C)   Resp 18   Ht 5\' 7"  (1.702 m)   Wt 221 lb 8 oz (100.5 kg)   SpO2 96%   BMI 34.69 kg/m  Wt Readings from Last 3 Encounters:  12/14/22 221 lb 8 oz (100.5 kg)  11/16/22 212 lb 6.4 oz (96.3 kg)  11/15/22 214 lb 15.2 oz (97.5 kg)     Health Maintenance Due  Topic Date Due   COVID-19 Vaccine (1) Never done   INFLUENZA VACCINE  12/02/2022    There are no preventive care reminders to display for this patient.  Lab Results  Component Value Date   TSH 5.114 (H) 11/09/2021   Lab Results  Component Value Date   WBC 4.6 11/08/2022   HGB 10.2 (L) 11/08/2022   HCT 33.8 (L) 11/08/2022   MCV 88.9 11/08/2022   PLT 313 11/08/2022   Lab Results  Component Value Date   NA 138 11/16/2022   K 5.1 11/16/2022   CO2 27 11/16/2022   GLUCOSE 311 (H) 11/16/2022   BUN 57 (H) 11/16/2022   CREATININE 1.66 (H) 11/16/2022   BILITOT 0.7 11/06/2022   ALKPHOS 167 (H) 11/06/2022   AST 18 11/06/2022   ALT 11 11/06/2022   PROT 6.0 (L) 11/06/2022   ALBUMIN 1.8 (L) 11/06/2022   CALCIUM 8.8 11/16/2022   ANIONGAP 7 11/15/2022   EGFR 52 (L) 06/12/2021  Lab Results  Component Value Date   CHOL 216 (H) 05/27/2022   Lab Results  Component Value Date   HDL 46 05/27/2022   Lab Results  Component Value Date   LDLCALC 145 (H) 05/27/2022   Lab Results  Component Value Date   TRIG 124 05/27/2022   Lab Results  Component Value Date   CHOLHDL 4.7 05/27/2022   Lab Results  Component Value Date   HGBA1C 11.7 (H) 11/05/2022      Assessment & Plan:   1. 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  Will increase Lantus to 20 units twice daily, continue Farxiga 10 mg.   Will prescribe Mounjaro 2.5 mg weekly and attempt to better control diabetes without having to continue to increase insulin.  Will follow-up in 1 month on medication.  - dapagliflozin propanediol (FARXIGA) 10 MG TABS tablet; Take 1 tablet (10 mg total) by mouth once daily.  Dispense: 90 tablet; Refill: 1 - tirzepatide (MOUNJARO) 2.5 MG/0.5ML Pen; Inject 2.5 mg into the skin once a week.  Dispense: 2 mL; Refill: 0  2. Essential hypertension/HFrEF (heart failure with reduced ejection fraction) (HCC)/NICM (nonischemic cardiomyopathy) [facility]): Doing much better now that she is compliant with her medications, will refill Coreg.  Following up with cardiology next week. Recheck kidney function and potassium today.  - carvedilol (COREG) 3.125 MG tablet; Take 1 tablet (3.125 mg total) by mouth 2 (two) times daily with a meal.  Dispense: 60 tablet; Refill: 3 - Basic Metabolic Panel (BMET)   Follow-up: Return in about 4 weeks (around 01/11/2023).    Margarita Mail, DO

## 2022-12-14 ENCOUNTER — Encounter: Payer: Self-pay | Admitting: Internal Medicine

## 2022-12-14 ENCOUNTER — Ambulatory Visit (INDEPENDENT_AMBULATORY_CARE_PROVIDER_SITE_OTHER): Payer: Medicaid Other | Admitting: Internal Medicine

## 2022-12-14 ENCOUNTER — Other Ambulatory Visit: Payer: Self-pay

## 2022-12-14 VITALS — BP 124/72 | HR 74 | Temp 98.0°F | Resp 18 | Ht 67.0 in | Wt 221.5 lb

## 2022-12-14 DIAGNOSIS — I10 Essential (primary) hypertension: Secondary | ICD-10-CM

## 2022-12-14 DIAGNOSIS — Z794 Long term (current) use of insulin: Secondary | ICD-10-CM

## 2022-12-14 DIAGNOSIS — I5020 Unspecified systolic (congestive) heart failure: Secondary | ICD-10-CM | POA: Diagnosis not present

## 2022-12-14 DIAGNOSIS — I428 Other cardiomyopathies: Secondary | ICD-10-CM

## 2022-12-14 DIAGNOSIS — E1129 Type 2 diabetes mellitus with other diabetic kidney complication: Secondary | ICD-10-CM | POA: Diagnosis not present

## 2022-12-14 MED ORDER — DAPAGLIFLOZIN PROPANEDIOL 10 MG PO TABS
10.0000 mg | ORAL_TABLET | Freq: Every day | ORAL | 1 refills | Status: DC
Start: 2022-12-14 — End: 2023-02-17
  Filled 2022-12-14: qty 90, 90d supply, fill #0

## 2022-12-14 MED ORDER — CARVEDILOL 3.125 MG PO TABS
3.1250 mg | ORAL_TABLET | Freq: Two times a day (BID) | ORAL | 3 refills | Status: DC
Start: 2022-12-14 — End: 2023-02-04
  Filled 2022-12-14: qty 60, 30d supply, fill #0

## 2022-12-14 MED ORDER — TIRZEPATIDE 2.5 MG/0.5ML ~~LOC~~ SOAJ
2.5000 mg | SUBCUTANEOUS | 0 refills | Status: DC
Start: 2022-12-14 — End: 2023-01-13
  Filled 2022-12-14: qty 2, 28d supply, fill #0

## 2022-12-14 NOTE — Patient Instructions (Addendum)
It was great seeing you today!  Plan discussed at today's visit: -Blood work ordered today, results will be uploaded to MyChart.  -Try Mounjaro 2.5 mg once a week for diabetes, will see if insurance will cover -Continue all other medications, increase Lantus to 20 units twice a day  Follow up in: 1 month   Take care and let us know if you have any questions or concerns prior to your next visit.  Dr. Caralee Ates

## 2022-12-17 ENCOUNTER — Other Ambulatory Visit: Payer: Self-pay

## 2022-12-20 IMAGING — CR DG CHEST 2V
1 series · 2 of 2 positions shown · non-contrast
Comparison: Chest x-ray dated May 19, 2020.

CLINICAL DATA: Shortness of breath.  Fluid retention.

EXAM:
CHEST - 2 VIEW

[Series 1: dg chest 2 view · 0.14mm/px · 2 of 2 slices shown]
[im 1/2]
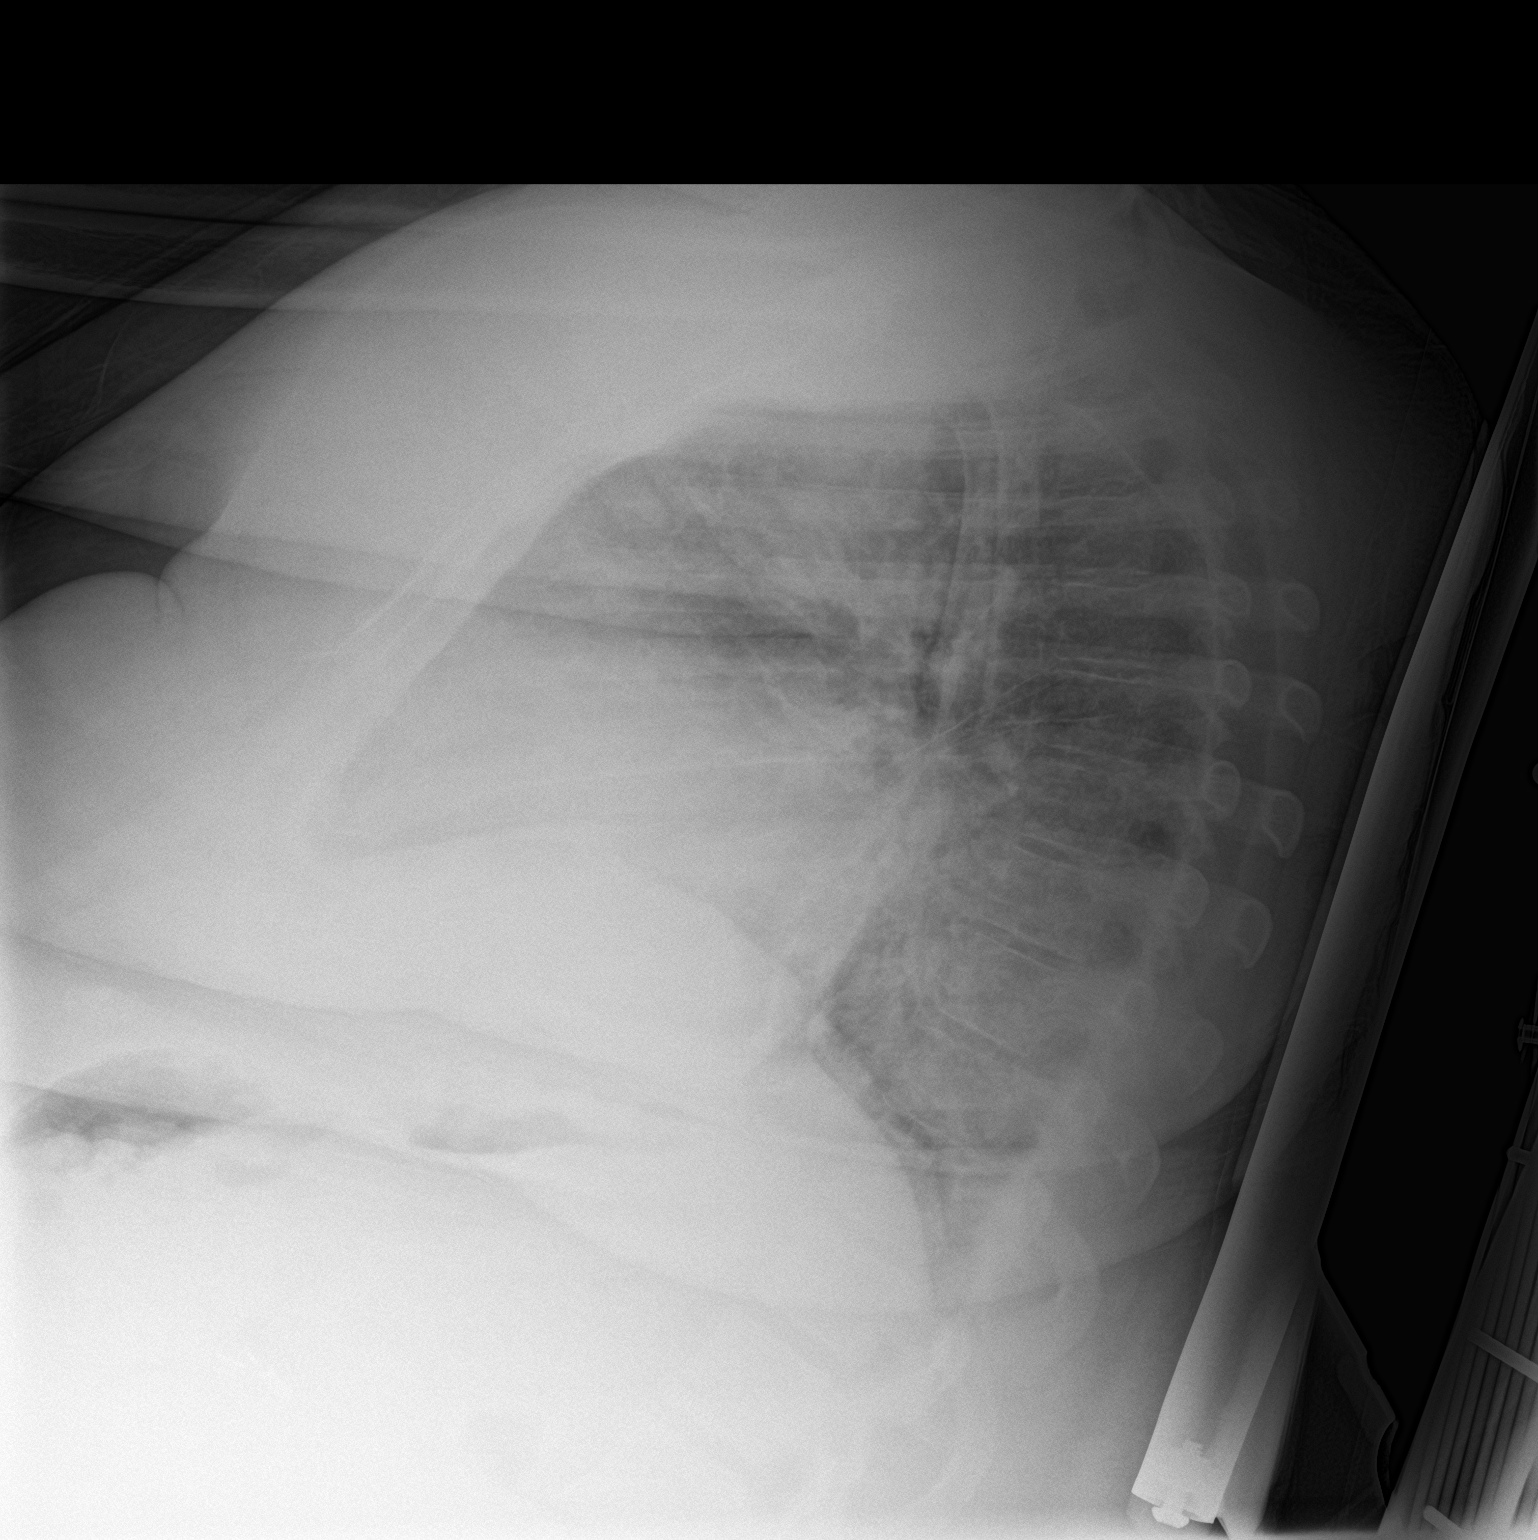
[im 2/2]
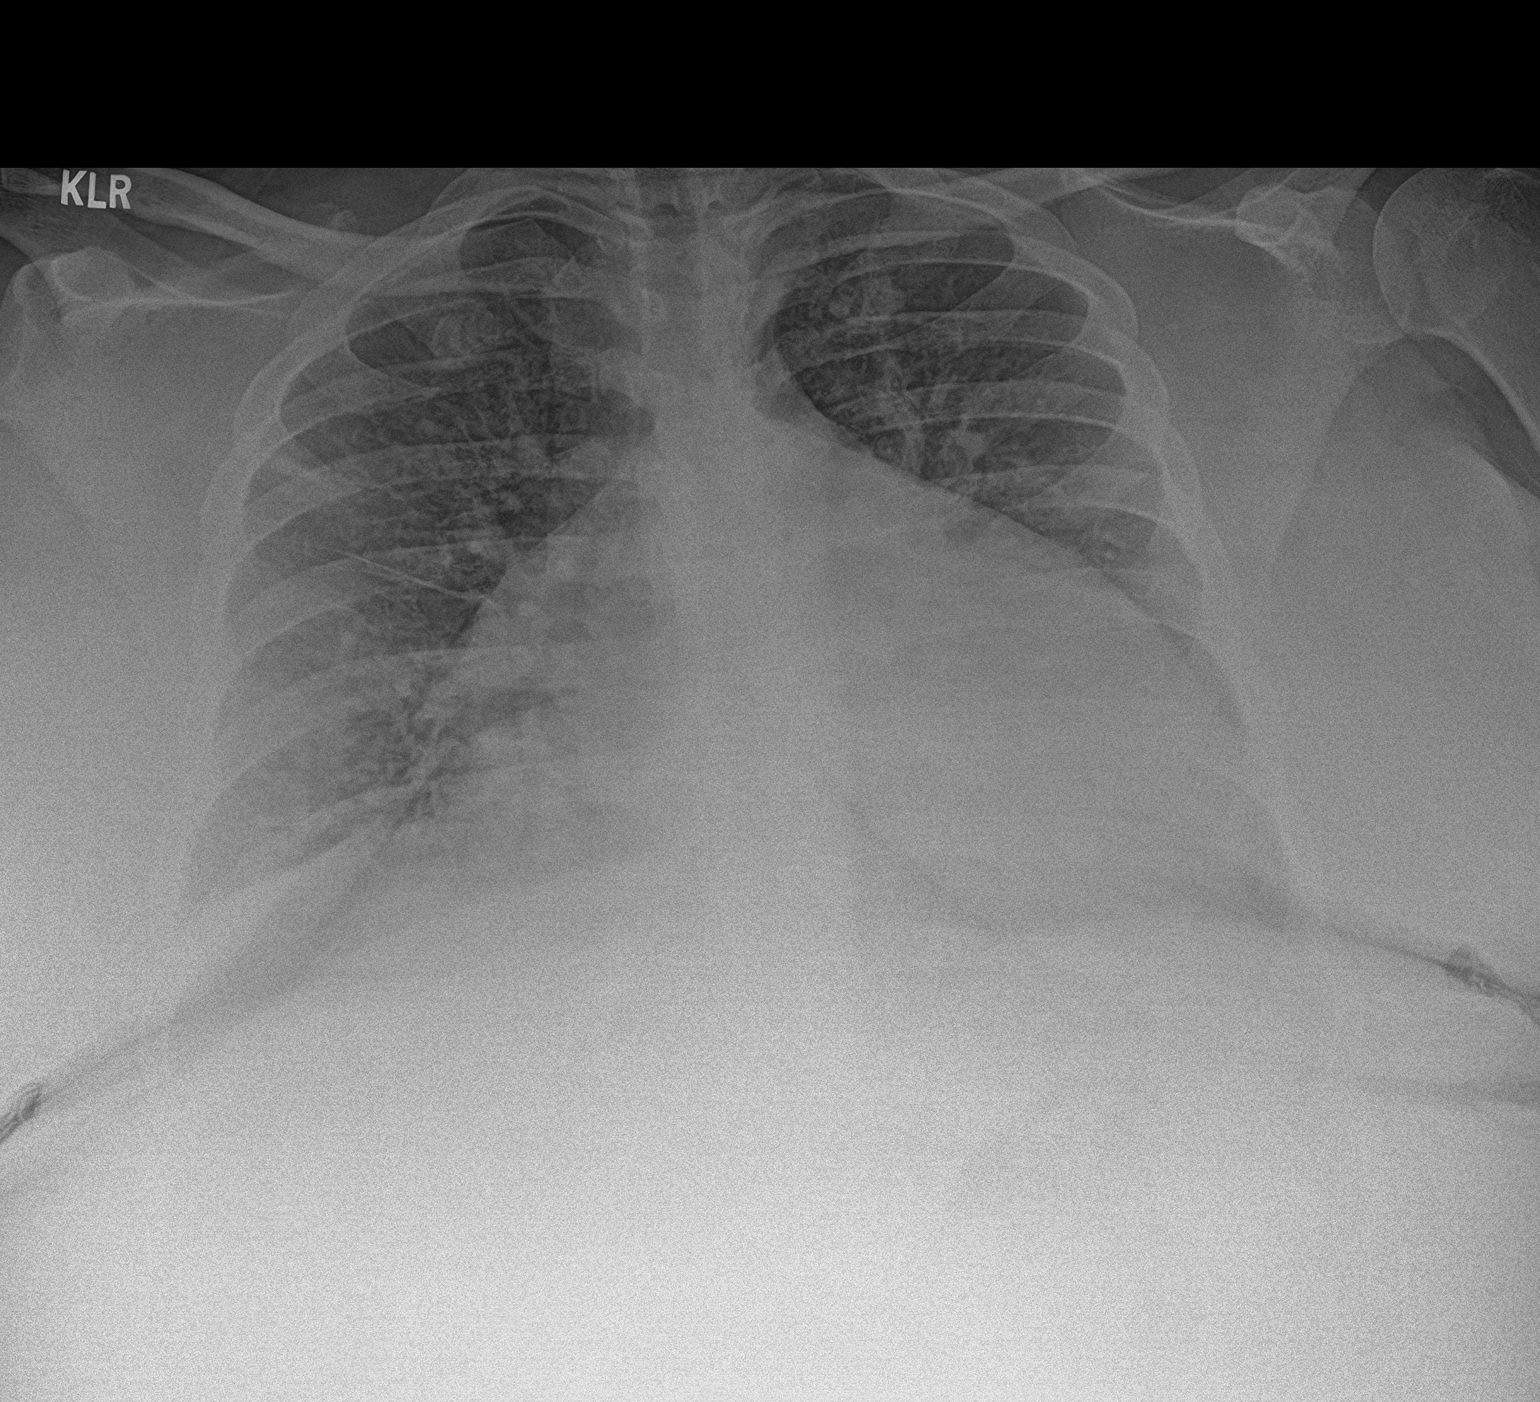

[2 of 2 positions shown; findings below may reference images not displayed]

FINDINGS: Cardiopericardial silhouette appears enlarged compared to the prior
study. Continued pulmonary vascular congestion and interstitial
edema. Right lower lobe atelectasis. No focal consolidation, pleural
effusion, or pneumothorax. No acute osseous abnormality.
IMPRESSION: 1. Enlarging cardiopericardial silhouette concerning for underlying
pericardial effusion.
2. Continued pulmonary vascular congestion and interstitial edema.

## 2022-12-20 NOTE — Progress Notes (Unsigned)
Cardiology Office Note    Date:  12/21/2022   ID:  [name], Molly Howard 10/23/1985, MRN [id number]  PCP:  Margarita Mail, DO  Cardiologist:  Debbe Odea, MD  Electrophysiologist:  Lanier Prude, MD   Chief Complaint: Hospital follow up  History of Present Illness:   Molly Howard is a 37 y.o. 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   Her cardiomyopathy dates back to 2021 with echo at that time showing an EF of 35 to 40%.  Cardiac MRI in 05/2020 showed an EF of 42%, mild LV dilatation, RVEF 50%, and no delayed enhancement.  Echo from 07/2022 showed an EF of 25 to 30%, mildly decreased RV systolic function, PASP 50 mmHg, moderate to severe tricuspid regurgitation, and mild mitral regurgitation.  RHC in 07/2022 showed elevated right greater than left heart filling pressures, pulmonary venous hypertension, and moderately reduced cardiac output.  She has required ongoing hospital admissions for volume overload requiring aggressive diuresis and augmentation with milrinone.  Following discharge in 07/2022, she never followed up with cardiology.  She was most recently admitted to the hospital in 11/2022 for volume overload after running out of all of her medications.  She continued to use cocaine regularly and was cocaine positive that admission.  She was followed by the advanced heart failure service during the admission with symptomatic improvement following aggressive diuresis, net -36 L for the admission.   Meds for discharge included Entresto 24/26 mg twice daily, carvedilol 3.25 mg twice daily, digoxin 0.125 mg daily, torsemide 40 mg twice daily, Farxiga 10 mg daily, and atorvastatin 20 mg daily.  With potassium running high normal and concerns regarding  compliance, spironolactone was discontinued.  She comes in doing well from a cardiac perspective and denies symptoms of angina, dyspnea, or cardiac decompensation.  No lower extremity swelling, abdominal distention, or, PND, or early satiety.  She does add a little salt to her food at times.  Drinking less than 2 L of liquid daily.  She reports she has not smoked tobacco or used cocaine since prior to her admission in July 2024.  She does drink 2-3 beers nightly.  She reports adherence to cardiac medications reviewed individually.  She reports intermittent daily palpitations that will last for approximately 20 minutes and are not associated with dizziness, presyncope, or syncope.  When compared to her last visit in our office from 01/2022, her weight is down 54 pounds.  She reports weighing herself daily at home and denies any weight gain greater than 3 pounds overnight or 5 pounds in a 1 week timeframe.   Labs independently reviewed: 12/2022 - potassium 4.4, BUN 38, serum creatinine 1.45 11/2022 - magnesium 2.6, Hgb 10.2, PLT 313, digoxin 0.3, free T4 normal, albumin 1.8, AST/ALT normal, urine drug screen positive for cocaine, A1c 11.7 05/2022 - TC 216, TG 124, HDL 46, LDL 145  Past Medical History:  Diagnosis Date   Acid reflux    Chronic HFrEF (heart failure with reduced ejection fraction) (HCC)    a. 10/2019 Echo: EF 35-40%, GrII DD; b. 05/2020 Echo: EF 20-25%, glob HK; c. 10/2020 Echo: EF 30-35%, glob HK. GrII DD, Mildly red RV fxn. Mod TR; d.  05/2021 cMRI: EF 42%, no LGE. Nl RV size/fxn.   CKD (chronic kidney disease), stage II  Diabetes mellitus (HCC)    H/O medication noncompliance    Hypertension    Microcytic anemia    NICM (nonischemic cardiomyopathy) (HCC)    a. 10/2019 Echo: EF 35-40%; b. 10/2019 MV: No ischemia. Small apical defect-->breast attenuation; c. 05/2020 Echo: EF 20-25%; d. 10/2020 Echo: EF 30-35%, glob HK. GrII DD, Mildly red RV fxn. Mod TR; e. 05/2021 cMRI: EF 42%, no LGE. Nl RV  size/fxn.   Obesity    Polysubstance abuse [facility])     Past Surgical History:  Procedure Laterality Date   CHOLECYSTECTOMY     HERNIA REPAIR     RIGHT HEART CATH N/A 07/28/2022   Procedure: RIGHT HEART CATH;  Surgeon: Iran Ouch, MD;  Location: [facility] INVASIVE CV LAB;  Service: Cardiovascular;  Laterality: N/A;    Current Medications: Current Meds  Medication Sig   Accu-Chek Softclix Lancets lancets Use as instructed   atorvastatin (LIPITOR) 20 MG tablet Take 1 tablet (20 mg total) by mouth daily.   calcium carbonate (OS-CAL - DOSED IN MG OF ELEMENTAL CALCIUM) 1250 (500 Ca) MG tablet Take 1 tablet (1,250 mg total) by mouth 3 (three) times daily with meals.   carvedilol (COREG) 3.125 MG tablet Take 1 tablet (3.125 mg total) by mouth 2 (two) times daily with a meal.   dapagliflozin propanediol (FARXIGA) 10 MG TABS tablet Take 1 tablet (10 mg total) by mouth once daily.   digoxin (LANOXIN) 0.125 MG tablet Take 1 tablet (0.125 mg total) by mouth daily.   ferrous sulfate 325 (65 FE) MG tablet Take 1 tablet (325 mg total) by mouth daily with breakfast.   insulin glargine (LANTUS) 100 UNIT/ML Solostar Pen Inject 15 Units into the skin 2 (two) times daily.   nitroGLYCERIN (NITROSTAT) 0.4 MG SL tablet Place 1 tablet (0.4 mg total) under the tongue every 5 (five) minutes as needed for chest pain.   sacubitril-valsartan (ENTRESTO) 49-51 MG Take 1 tablet by mouth 2 (two) times daily.   tirzepatide [facility]) 2.5 MG/0.5ML Pen Inject 2.5 mg into the skin once a week.   torsemide (DEMADEX) 20 MG tablet Take 2 tablets (40 mg total) by mouth in the morning and at bedtime.   [DISCONTINUED] sacubitril-valsartan (ENTRESTO) 24-26 MG Take 1 tablet by mouth 2 (two) times daily.    Allergies:   No healthtouch food allergies   Social History   Socioeconomic History   Marital status: Single    Spouse name: Not on file   Number of children: Not on file   Years of education: Not on file   Highest  education level: Not on file  Occupational History   Not on file  Tobacco Use   Smoking status: Former    Current packs/day: 0.00    Types: Cigarettes    Quit date: 2022    Years since quitting: 2.6   Smokeless tobacco: Never  Vaping Use   Vaping status: Never Used  Substance and Sexual Activity   Alcohol use: Not Currently    Comment: ~ 4 shots/day on weekends only   Drug use: Not Currently    Types: Cocaine    Comment: Admits to using cocaine up and will February 2024.   Sexual activity: Not Currently    Birth control/protection: None  Other Topics Concern   Not on file  Social History Narrative   Lives locally.  Does not routinely exercise.  Has been using cocaine.   Social Determinants of Health   Financial Resource Strain: High Risk (06/16/2020)  Overall Financial Resource Strain (CARDIA)    Difficulty of Paying Living Expenses: Hard  Food Insecurity: Food Insecurity Present (11/24/2022)   Hunger Vital Sign    Worried About Running Out of Food in the Last Year: Sometimes true    Ran Out of Food in the Last Year: Sometimes true  Transportation Needs: Unmet Transportation Needs (11/24/2022)   PRAPARE - Administrator, Civil Service (Medical): Yes    Lack of Transportation (Non-Medical): Yes  Physical Activity: Not on file  Stress: Not on file  Social Connections: Not on file     Family History:  The patient's family history includes Diabetes in her father and mother; Heart failure in her mother; Hypertension in her father.  ROS:   12-point review of systems is negative unless otherwise noted in the HPI.   EKGs/Labs/Other Studies Reviewed:    Studies reviewed were summarized above. The additional studies were reviewed today:  RHC 07/28/2022: Successful right heart catheterization via the right antecubital vein. Severely elevated right and left-sided filling pressures, moderate pulmonary hypertension and moderately reduced cardiac output.   RA: 30  mmHg RV: 56/14 mmHg PW: 34 mmHg PA: 54/30 with a mean of 41 mmHg PA sat is 42% Cardiac output: 4.47 with a cardiac index of 2. CPO is 0.89 PAPI: 0.8   Recommendations: The patient is severely volume overloaded with evidence of biventricular failure right more than left by hemodynamics. Recommend starting milrinone drip and furosemide drip.  Will transfer to stepdown unit. __________   2D echo 07/25/2022: 1. Left ventricular ejection fraction, by estimation, is 25 to 30%. Left  ventricular ejection fraction by 2D MOD biplane is 21.6 %. The left  ventricle has severely decreased function. The left ventricle demonstrates  global hypokinesis. Left ventricular  diastolic parameters are indeterminate.   2. Right ventricular systolic function is mildly reduced. The right  ventricular size is normal. There is moderately elevated pulmonary artery  systolic pressure. The estimated right ventricular systolic pressure is  50.7 mmHg.   3. Left atrial size was mildly dilated.   4. A small pericardial effusion is present. The pericardial effusion is  circumferential. There is no evidence of cardiac tamponade.   5. The aortic valve is tricuspid. Aortic valve regurgitation is not  visualized. No aortic stenosis is present.   6. The mitral valve is normal in structure. Mild mitral valve  regurgitation. No evidence of mitral stenosis.   7. Tricuspid valve regurgitation is moderate to severe.  __________  Limited echo 09/18/2021: 1. Left ventricular ejection fraction, by estimation, is 25 to 30%. Left  ventricular ejection fraction by 3D volume is 28 %. The left ventricle has  severely decreased function. The left ventricle demonstrates global  hypokinesis. Left ventricular  diastolic parameters are consistent with Grade II diastolic dysfunction  (pseudonormalization). The average left ventricular global longitudinal  strain is -5.9 %. The global longitudinal strain is abnormal.   2. Right  ventricular systolic function is mildly reduced. The right  ventricular size is mildly enlarged. Tricuspid regurgitation signal is  inadequate for assessing PA pressure.   3. The mitral valve is normal in structure. No evidence of mitral valve  regurgitation. No evidence of mitral stenosis.   4. The aortic valve is tricuspid. Aortic valve regurgitation is not  visualized. No aortic stenosis is present.   5. The inferior vena cava is normal in size with greater than 50%  respiratory variability, suggesting right atrial pressure of 3 mmHg.  __________  2D echo 06/30/2021: 1. Left ventricular ejection fraction, by estimation, is 30 to 35%. The  left ventricle has moderately decreased function. The left ventricle  demonstrates global hypokinesis. The left ventricular internal cavity size  was mildly dilated. Left ventricular  diastolic parameters are consistent with Grade II diastolic dysfunction  (pseudonormalization).   2. Right ventricular systolic function is normal. The right ventricular  size is normal. Tricuspid regurgitation signal is inadequate for assessing  PA pressure.   3. The mitral valve is normal in structure. Mild mitral valve  regurgitation. No evidence of mitral stenosis.   4. The aortic valve is tricuspid. Aortic valve regurgitation is not  visualized. No aortic stenosis is present.   5. The inferior vena cava is normal in size with greater than 50%  respiratory variability, suggesting right atrial pressure of 3 mmHg.  __________  Cardiac MRI 05/19/2021: IMPRESSION: 1. Mild LV dilatation, mild hypertrophy, and mild systolic dysfunction (EF 42%)  2.  No late gadolinium enhancement to suggest myocardial scar 3.  Normal RV size and systolic function (EF 50%) ___________  2D echo 03/20/2021: 1. Left ventricular ejection fraction, by estimation, is 25 to 30%. The  left ventricle has severely decreased function. The left ventricle  demonstrates global hypokinesis. Left  ventricular diastolic parameters are  consistent with Grade II diastolic  dysfunction (pseudonormalization). The average left ventricular global  longitudinal strain is -9.2 %. The global longitudinal strain is abnormal.   2. Right ventricular systolic function is mildly reduced. The right  ventricular size is mildly enlarged. There is severely elevated pulmonary  artery systolic pressure. The estimated right ventricular systolic  pressure is 70.8 mmHg.   3. Left atrial size was mildly dilated.   4. The mitral valve is normal in structure. Mild mitral valve  regurgitation. No evidence of mitral stenosis.   5. Tricuspid valve regurgitation is mild to moderate.   6. The inferior vena cava is dilated in size with <50% respiratory  variability, suggesting right atrial pressure of 15 mmHg.  __________  2D echo 10/11/2020: 1. Left ventricular ejection fraction, by estimation, is 30 to 35%. The  left ventricle has moderate to severely decreased function. The left  ventricle demonstrates global hypokinesis. Left ventricular diastolic  parameters are consistent with Grade II  diastolic dysfunction (pseudonormalization).   2. Right ventricular systolic function is mildly reduced. The right  ventricular size is normal.   3. A small pericardial effusion is present.   4. The mitral valve is normal in structure. No evidence of mitral valve  regurgitation.   5. Tricuspid valve regurgitation is moderate.   6. The aortic valve is tricuspid. Aortic valve regurgitation is not  visualized.  __________  2D echo 05/20/2020: 1. Left ventricular ejection fraction, by estimation, is 20 to 25%. The  left ventricle has severely decreased function. The left ventricle  demonstrates global hypokinesis. The left ventricular internal cavity size  was mildly dilated. Indeterminate  diastolic filling due to E-A fusion.   2. Right ventricular systolic function is normal. The right ventricular  size is mildly  enlarged. There is severely elevated pulmonary artery  systolic pressure.   3. The mitral valve is normal in structure. Trivial mitral valve  regurgitation. No evidence of mitral stenosis.   4. Tricuspid valve regurgitation is moderate.   5. The aortic valve was not well visualized. Aortic valve regurgitation  is not visualized. No aortic stenosis is present.   6. The inferior vena cava is dilated in size with <50%  respiratory  variability, suggesting right atrial pressure of 15 mmHg.  __________  Eugenie Birks MPI 10/05/2019: There was no ST segment deviation noted during stress. T wave inversion was noted during stress in the I and aVL leads. Defect 1: There is a small defect of mild severity present in the apex location. This is likely due to breast attenuation artifact Nuclear stress EF: 30%. This is a high risk study due to low EF. Low EF seems to be due to nonischemic cardiomyopathy. __________  2D echo 10/03/2019: 1. Left ventricular ejection fraction, by estimation, is 35 to 40%. The  left ventricle has moderately decreased function. The left ventricle  demonstrates global hypokinesis. Left ventricular diastolic parameters are  consistent with Grade II diastolic  dysfunction (pseudonormalization).   2. Right ventricular systolic function is normal. The right ventricular  size is normal.   3. The mitral valve is normal in structure. No evidence of mitral valve  regurgitation. No evidence of mitral stenosis.   4. The aortic valve is normal in structure. Aortic valve regurgitation is  not visualized. No aortic stenosis is present.   5. The inferior vena cava is normal in size with greater than 50%  respiratory variability, suggesting right atrial pressure of 3 mmHg.    EKG:  EKG is ordered today.  The EKG ordered today demonstrates NSR, 71 bpm, nonspecific inferolateral ST-T changes, consistent with prior tracings  Recent Labs: 11/05/2022: B Natriuretic Peptide 1,267.5 11/06/2022: ALT  11 11/08/2022: Hemoglobin 10.2; Platelets 313 11/15/2022: Magnesium 2.6 12/14/2022: BUN 38; Creat 1.45; Potassium 4.4; Sodium 143  Recent Lipid Panel    Component Value Date/Time   CHOL 216 (H) 05/27/2022 1022   TRIG 124 05/27/2022 1022   HDL 46 05/27/2022 1022   CHOLHDL 4.7 05/27/2022 1022   VLDL 25 05/27/2022 1022   LDLCALC 145 (H) 05/27/2022 1022   LDLCALC 171 (H) 06/12/2021 1354    PHYSICAL EXAM:    VS:  BP (!) 150/98 (BP Location: Left Arm, Patient Position: Sitting, Cuff Size: Normal)   Pulse 69   Ht 5\' 7"  (1.702 m)   Wt 209 lb (94.8 kg)   SpO2 94%   BMI 32.73 kg/m   BMI: Body mass index is 32.73 kg/m.  Physical Exam Vitals reviewed.  Constitutional:      Appearance: She is well-developed.  HENT:     Head: Normocephalic and atraumatic.  Eyes:     General:        Right eye: No discharge.        Left eye: No discharge.  Neck:     Vascular: No JVD.  Cardiovascular:     Rate and Rhythm: Normal rate and regular rhythm.     Heart sounds: S1 normal and S2 normal. Heart sounds not distant. No midsystolic click and no opening snap. Murmur heard.     Systolic murmur is present with a grade of 1/6 at the upper left sternal border.     No friction rub.  Pulmonary:     Effort: Pulmonary effort is normal. No respiratory distress.     Breath sounds: Normal breath sounds. No decreased breath sounds, wheezing or rales.  Chest:     Chest wall: No tenderness.  Abdominal:     General: There is no distension.     Palpations: Abdomen is soft.     Tenderness: There is no abdominal tenderness.  Musculoskeletal:     Cervical back: Normal range of motion.     Right lower leg: No  edema.     Left lower leg: No edema.  Skin:    General: Skin is warm and dry.     Nails: There is no clubbing.  Neurological:     Mental Status: She is alert and oriented to person, place, and time.  Psychiatric:        Speech: Speech normal.        Behavior: Behavior normal.        Thought Content:  Thought content normal.        Judgment: Judgment normal.     Wt Readings from Last 3 Encounters:  12/21/22 209 lb (94.8 kg)  12/14/22 221 lb 8 oz (100.5 kg)  11/16/22 212 lb 6.4 oz (96.3 kg)     ASSESSMENT & PLAN:   Chronic combined systolic and diastolic CHF with BiV failure: Cardiomyopathy felt to be nonischemic in etiology due to ongoing cocaine abuse and uncontrolled hypertension.  No delayed enhancement on cardiac MRI.  She has been followed by the advanced heart failure service and is not a candidate for advanced therapies due to persistently positive tox screens for cocaine.  Titrate Entresto to 49/51 mg twice daily with a follow-up BMP 1 week thereafter.  Continue current dose carvedilol, Farxiga, torsemide and digoxin.  Not on spironolactone given concerns for medication adherence and follow-up.  Check digoxin level today.  Recent BMP last week showed improved serum creatinine with stable potassium.  CHF education.  Advanced heart failure team recommends heart failure follow-up.  Moderate to severe tricuspid regurgitation: Not a candidate for invasive procedure given persistently positive urine toxicology screens.  Continue medical therapy as outlined above.  Palpitations: Place Zio patch.  Remains on carvedilol.  Recent potassium and magnesium at goal.  Free T4 normal.  Possibly exacerbated/driven by cocaine use.  HTN: Blood pressure is mildly elevated in the office today.  Titrate Entresto with continuation of carvedilol as outlined above.  Cocaine, tobacco, and ongoing alcohol abuse: Reports that abstaining from cocaine and tobacco use.  Drinking 2-3 beers nightly.  Complete cessation and continued abstinence of polysubstance abuse is encouraged.  Iron deficiency anemia: Hemoglobin stable during check last month.  Follow-up with PCP   Uncontrolled DM2: A1c 11.7 last month.  Ongoing management per PCP.  CKD stage IIIa: Renal function improved on labs obtained last  week.    Disposition: F/u with Dr. Azucena Cecil or an APP in 6 weeks.   Medication Adjustments/Labs and Tests Ordered: Current medicines are reviewed at length with the patient today.  Concerns regarding medicines are outlined above. Medication changes, Labs and Tests ordered today are summarized above and listed in the Patient Instructions accessible in Encounters.   Signed, Eula Listen, PA-C 12/21/2022 4:46 PM     [facility] [place] [address] [place], [place] [address] [phone]

## 2022-12-21 ENCOUNTER — Ambulatory Visit (INDEPENDENT_AMBULATORY_CARE_PROVIDER_SITE_OTHER): Payer: Medicaid Other

## 2022-12-21 ENCOUNTER — Ambulatory Visit: Payer: Medicaid Other | Attending: Physician Assistant | Admitting: Physician Assistant

## 2022-12-21 ENCOUNTER — Other Ambulatory Visit: Payer: Self-pay

## 2022-12-21 ENCOUNTER — Encounter: Payer: Self-pay | Admitting: Physician Assistant

## 2022-12-21 VITALS — BP 150/98 | HR 69 | Ht 67.0 in | Wt 209.0 lb

## 2022-12-21 DIAGNOSIS — I428 Other cardiomyopathies: Secondary | ICD-10-CM | POA: Diagnosis not present

## 2022-12-21 DIAGNOSIS — I5082 Biventricular heart failure: Secondary | ICD-10-CM

## 2022-12-21 DIAGNOSIS — I071 Rheumatic tricuspid insufficiency: Secondary | ICD-10-CM

## 2022-12-21 DIAGNOSIS — I5042 Chronic combined systolic (congestive) and diastolic (congestive) heart failure: Secondary | ICD-10-CM | POA: Diagnosis not present

## 2022-12-21 DIAGNOSIS — R002 Palpitations: Secondary | ICD-10-CM

## 2022-12-21 DIAGNOSIS — F1910 Other psychoactive substance abuse, uncomplicated: Secondary | ICD-10-CM

## 2022-12-21 DIAGNOSIS — N1831 Chronic kidney disease, stage 3a: Secondary | ICD-10-CM

## 2022-12-21 DIAGNOSIS — E1165 Type 2 diabetes mellitus with hyperglycemia: Secondary | ICD-10-CM

## 2022-12-21 DIAGNOSIS — I4891 Unspecified atrial fibrillation: Secondary | ICD-10-CM | POA: Diagnosis not present

## 2022-12-21 DIAGNOSIS — D509 Iron deficiency anemia, unspecified: Secondary | ICD-10-CM

## 2022-12-21 DIAGNOSIS — I10 Essential (primary) hypertension: Secondary | ICD-10-CM

## 2022-12-21 DIAGNOSIS — I4892 Unspecified atrial flutter: Secondary | ICD-10-CM | POA: Diagnosis not present

## 2022-12-21 MED ORDER — ENTRESTO 49-51 MG PO TABS
1.0000 | ORAL_TABLET | Freq: Two times a day (BID) | ORAL | 3 refills | Status: DC
Start: 2022-12-21 — End: 2023-03-10
  Filled 2022-12-21 – 2023-01-14 (×2): qty 180, 90d supply, fill #0

## 2022-12-21 NOTE — Patient Instructions (Addendum)
Medication Instructions:  Your physician recommends the following medication changes.  INCREASE: Sacubitril-Valsartan (Entresto 49-51 mg) one tablet by mouth twice daily  *If you need a refill on your cardiac medications before your next appointment, please call your pharmacy*   Lab Work: Your provider would like for you to have following labs drawn today Digoxin level.   If you have labs (blood work) drawn today and your tests are completely normal, you will receive your results only by: MyChart Message (if you have MyChart) OR A paper copy in the mail If you have any lab test that is abnormal or we need to change your treatment, we will call you to review the results.  Your provider would like for you to return in 1 week to have the following labs drawn: BMP.   Please go to [facility] [address] ([facility] [address], [place] [address] You do not need an appointment.  They are open from 7:30 am-4 pm.  Lunch from 1:00 pm- 2:00 pm You don't need to be fasting.   You may also go to any of these [facility] locations:  [employer]  - [address] [employer] [address] [employer] Officer)   Testing/Procedures: Your physician has recommended that you wear a 14 day Zio monitor.   This monitor is a medical device that records the heart's electrical activity. Doctors most often use these monitors to diagnose arrhythmias. Arrhythmias are problems with the speed or rhythm of the heartbeat. The monitor is a small device applied to your chest. You can wear one while you do your normal daily activities. While wearing this monitor if you have any symptoms to push the button and record what you felt. Once you have worn this monitor for the period of time provider prescribed (Usually 14 days), you will return the monitor device in the postage paid box. Once it is returned they will download the data collected and provide [place] with a report which the provider will then review and  we will call you with those results. Important tips:  Avoid showering during the first 24 hours of wearing the monitor. Avoid excessive sweating to help maximize wear time. Do not submerge the device, no hot tubs, and no swimming pools. Keep any lotions or oils away from the patch. After 24 hours you may shower with the patch on. Take brief showers with your back facing the shower head.  Do not remove patch once it has been placed because that will interrupt data and decrease adhesive wear time. Push the button when you have any symptoms and write down what you were feeling. Once you have completed wearing your monitor, remove and place into box which has postage paid and place in your outgoing mailbox.  If for some reason you have misplaced your box then call our office and we can provide another box and/or mail it off for you.     Follow-Up: At [facility], you and your health needs are our priority.  As part of our continuing mission to provide you with exceptional heart care, we have created designated Provider Care Teams.  These Care Teams include your primary Cardiologist (physician) and Advanced Practice Providers (APPs -  Physician Assistants and Nurse Practitioners) who all work together to provide you with the care you need, when you need it.  We recommend signing up for the patient portal called "MyChart".  Sign up information is provided on this After Visit Summary.  MyChart is used to  connect with patients for Virtual Visits (Telemedicine).  Patients are able to view lab/test results, encounter notes, upcoming appointments, etc.  Non-urgent messages can be sent to your provider as well.   To learn more about what you can do with MyChart, go to [email or web address].    Your next appointment:   6 week(s)  Provider:   Eula Listen, PA-C

## 2022-12-22 ENCOUNTER — Other Ambulatory Visit: Payer: Medicaid Other

## 2022-12-22 LAB — DIGOXIN LEVEL: Digoxin, Serum: 0.9 ng/mL (ref 0.5–0.9)

## 2022-12-22 NOTE — Patient Outreach (Signed)
  Medicaid Managed Care   Unsuccessful Outreach Note  12/22/2022 Name: Molly Howard MRN: [id number] DOB: 12-30-1985  Referred by: Margarita Mail, DO Reason for referral : High Risk Managed Medicaid (MM social work unsuccessful telephone outreach )   An unsuccessful telephone outreach was attempted today. The patient was referred to the case management team for assistance with care management and care coordination.   Follow Up Plan: The patient has been provided with contact information for the care management team and has been advised to call with any health related questions or concerns.   Abelino Derrick, MHA [facility]  Managed [facility] Social Worker [phone]

## 2022-12-22 NOTE — Patient Instructions (Signed)
  Medicaid Managed Care   Unsuccessful Outreach Note  12/22/2022 Name: Molly Howard MRN: [id number] DOB: 12-30-1985  Referred by: Margarita Mail, DO Reason for referral : High Risk Managed Medicaid (MM social work unsuccessful telephone outreach )   An unsuccessful telephone outreach was attempted today. The patient was referred to the case management team for assistance with care management and care coordination.   Follow Up Plan: The patient has been provided with contact information for the care management team and has been advised to call with any health related questions or concerns.   Abelino Derrick, MHA [facility]  Managed [facility] Social Worker [phone]

## 2022-12-23 ENCOUNTER — Other Ambulatory Visit: Payer: Self-pay

## 2022-12-23 ENCOUNTER — Telehealth: Payer: Self-pay | Admitting: Emergency Medicine

## 2022-12-23 DIAGNOSIS — Z79899 Other long term (current) drug therapy: Secondary | ICD-10-CM

## 2022-12-23 MED ORDER — DIGOXIN 125 MCG PO TABS
0.0625 mg | ORAL_TABLET | Freq: Every day | ORAL | 3 refills | Status: DC
  Filled 2022-12-23 – 2023-01-14 (×2): qty 30, 60d supply, fill #0
  Filled 2023-04-04: qty 30, 60d supply, fill #1

## 2022-12-23 NOTE — Telephone Encounter (Signed)
Called and spoke with patient. Informed her of the following from Eula Listen, PA-C.   Please inform the patient her digoxin is high normal.  Reduce digoxin to 0.0625 mg daily.  Check digoxin when she comes in for follow-up BMP next week.   Patient verbalized understanding. Prescription sent to preferred pharmacy. Orders placed for lab.

## 2022-12-23 NOTE — Telephone Encounter (Signed)
-----   Message from Eula Listen sent at 12/23/2022  1:26 PM EDT ----- Please inform the patient her digoxin is high normal.  Reduce digoxin to 0.0625 mg daily.  Check digoxin when she comes in for follow-up BMP next week.

## 2022-12-27 ENCOUNTER — Other Ambulatory Visit: Payer: Medicaid Other | Admitting: *Deleted

## 2022-12-27 ENCOUNTER — Encounter: Payer: Self-pay | Admitting: *Deleted

## 2022-12-27 DIAGNOSIS — R002 Palpitations: Secondary | ICD-10-CM | POA: Diagnosis not present

## 2022-12-27 NOTE — Patient Outreach (Signed)
Medicaid Managed Care   Nurse Care Manager Note  12/27/2022 Name:  Molly Howard MRN:  [id number] DOB:  10/02/1985  Molly Howard is an 37 y.o. year old female who is a primary patient of Margarita Mail, DO.  The [facility] Managed Care Coordination team was consulted for assistance with:    DMII  Ms. Hewett was given information about Medicaid Managed Care Coordination team services today. Cristie L Achor Patient agreed to services and verbal consent obtained.  Engaged with patient by telephone for follow up visit in response to provider referral for case management and/or care coordination services.   Assessments/Interventions:  Review of past medical history, allergies, medications, health status, including review of consultants reports, laboratory and other test data, was performed as part of comprehensive evaluation and provision of chronic care management services.  SDOH (Social Determinants of Health) assessments and interventions performed: SDOH Interventions    Flowsheet Row Patient Outreach Telephone from 11/24/2022 in [place] [facility] Telephone from 06/30/2020 in [facility] [place] Telephone from 06/16/2020 in [facility]  SDOH Interventions     Food Insecurity Interventions Other (Comment)  [working with BSW for food assistance] -- Other (Comment)  [will help with transportation to get to food pantries as that was the main barrier]  Housing Interventions Intervention Not Indicated -- Other (Comment)  [referred to [employer] to apply for low income water assistance program]  Transportation Interventions Payor Benefit  [Provided with [employer] transportation [phone] [employer] [facility] Transportation Services  Utilities Interventions Intervention Not Indicated -- --  Financial Strain Interventions -- -- Other (Comment)  [providing [employer] gift cards to help with medication costs]       Care  Plan  Allergies  Allergen Reactions   No [employer] Food Allergies Rash and Other (See Comments)    Lemons    Medications Reviewed Today     Reviewed by Heidi Dach, RN (Registered Nurse) on 12/27/22 at 1441  Med List Status: <None>   Medication Order Taking? Sig Documenting Provider Last Dose Status Informant  Accu-Chek Softclix Lancets lancets [id number] Yes Use as instructed Margarita Mail, DO  Active   atorvastatin (LIPITOR) 20 MG tablet [id number] Yes Take 1 tablet (20 mg total) by mouth daily. [name], MD  Active   calcium carbonate (OS-CAL - DOSED IN MG OF ELEMENTAL CALCIUM) 1250 (500 Ca) MG tablet [id number] Yes Take 1 tablet (1,250 mg total) by mouth 3 (three) times daily with meals. [name], MD  Active Self  carvedilol (COREG) 3.125 MG tablet [id number] Yes Take 1 tablet (3.125 mg total) by mouth 2 (two) times daily with a meal. Margarita Mail, DO  Active   dapagliflozin propanediol (FARXIGA) 10 MG TABS tablet [id number] Yes Take 1 tablet (10 mg total) by mouth once daily. Margarita Mail, DO  Active   digoxin (LANOXIN) 0.125 MG tablet [id number] Yes Take 0.5 tablets (0.0625 mg total) by mouth daily. Sondra Barges, PA-C  Active   ferrous sulfate 325 (65 FE) MG tablet [id number] Yes Take 1 tablet (325 mg total) by mouth daily with breakfast. [name], MD  Active   insulin glargine (LANTUS) 100 UNIT/ML Solostar Pen [id number] Yes Inject 15 Units into the skin 2 (two) times daily. [name], MD  Active   nitroGLYCERIN (NITROSTAT) 0.4 MG SL tablet [id number] Yes Place 1 tablet (0.4 mg total) under the tongue every 5 (five) minutes as needed for chest pain. [name], MD  Active  Discontinued 10/18/20 1034 (Reorder) sacubitril-valsartan (ENTRESTO) 49-51 MG [id number] Yes Take 1 tablet by mouth 2 (two) times daily. Sondra Barges, PA-C  Active   tirzepatide [facility]) 2.5 MG/0.5ML Pen [id number] Yes Inject 2.5 mg into the skin once a week. Margarita Mail, DO  Active    torsemide (DEMADEX) 20 MG tablet [id number] Yes Take 2 tablets (40 mg total) by mouth in the morning and at bedtime. [name], MD  Active   Med List Note Erasmo Leventhal 11/27/20 6644): Patient states she has been out of her medications for "a while now". It is due to financial limitations.            Patient Active Problem List   Diagnosis Date Noted   Cardiogenic shock (HCC) 11/14/2022   Bilateral leg edema 11/06/2022   NICM (nonischemic cardiomyopathy) (HCC) 07/31/2022   Hypocalcemia 07/25/2022   Leg edema 07/25/2022   Acute on chronic combined systolic and diastolic CHF (congestive heart failure) (HCC) 07/25/2022   GERD without esophagitis 07/24/2022   Cocaine abuse (HCC) 02/12/2022   Acute pulmonary edema (HCC)    Syncope 02/09/2022   Menorrhagia with regular cycle 06/22/2021   Dyslipidemia 11/27/2020   Chronic kidney disease (CKD), stage II (mild) 11/27/2020   Hypomagnesemia 10/17/2020   Dyspnea 05/21/2020   Essential hypertension 05/20/2020   Diabetes mellitus, type II (HCC) 05/20/2020   Chronic anemia 05/20/2020   Reactive thrombocytosis 05/20/2020   Obesity (BMI 30-39.9) 05/20/2020   Chronic combined systolic and diastolic heart failure (HCC) 11/01/2019   Financial difficulties 11/01/2019   Medication management 11/01/2019   Lung nodule 11/01/2019   Nonischemic cardiomyopathy (HCC) 11/01/2019   Polysubstance abuse (HCC) 11/01/2019   Tobacco use 11/01/2019   Swelling     Conditions to be addressed/monitored per PCP order:  DMII  Care Plan : RN Care Manager Plan of Care  Updates made by Heidi Dach, RN since 12/27/2022 12:00 AM     Problem: Health Management needs related to DMII      Long-Range Goal: Development of Plan of Care to address Health Management needs related to DMII   Start Date: 11/24/2022  Expected End Date: 02/22/2023  Note:   Current Barriers:  Chronic Disease Management support and education needs related to  DMII  RNCM Clinical Goal(s):  Patient will verbalize understanding of plan for management of DMII as evidenced by patient reports take all medications exactly as prescribed and will call provider for medication related questions as evidenced by patient reports    attend all scheduled medical appointments: 01/11/23 with PCP  as evidenced by provider documentation in EMR        continue to work with RN Care Manager and/or Social Worker to address care management and care coordination needs related to DMII as evidenced by adherence to CM Team Scheduled appointments     work with Child psychotherapist to address Financial constraints related to no income and Limited access to food related to the management of DMII as evidenced by review of EMR and patient or Child psychotherapist report     through collaboration with Medical illustrator, provider, and care team.   Interventions: Inter-disciplinary care team collaboration (see longitudinal plan of care) Evaluation of current treatment plan related to  self management and patient's adherence to plan as established by provider   Diabetes:  (Status: Goal on Track (progressing): YES.) Long Term Goal   Lab Results  Component Value Date   HGBA1C 11.7 (H) 11/05/2022   @  Assessed patient's understanding of A1c goal: <7% Provided education to patient about basic DM disease process; Reviewed medications with patient and discussed importance of medication adherence;        Reviewed prescribed diet with patient MyPlate method, decrease carbohydrates, sweets and sweet drinks; Discussed plans with patient for ongoing care management follow up and provided patient with direct contact information for care management team;      Reviewed scheduled/upcoming provider appointments including: 01/11/23 with Dr. Caralee Ates  ;         Review of patient status, including review of consultants reports, relevant laboratory and other test results, and medications completed;       Assessed social  determinant of health barriers;        Provided patient with [facility] transportation [phone], advised to call today to arrange transportation on 9/10, address and phone number provided Advised patient to contact [facility] member services [phone] for member benefitss(fresh fruit and vegetables)-revisited Discussed disability application-patient waiting on determination Advised patient to call [facility] to check on prescription glasses Advised patient to journal BS readings and take to next PCP appointment  Patient Goals/Self-Care Activities: Take medications as prescribed   Attend all scheduled provider appointments Call provider office for new concerns or questions  Work with the social worker to address care coordination needs and will continue to work with the clinical team to address health care and disease management related needs schedule appointment with eye doctor drink 6 to 8 glasses of water each day fill half of plate with vegetables limit fast food meals to no more than 1 per week       Follow Up:  Patient agrees to Care Plan and Follow-up.  Plan: The Managed Medicaid care management team will reach out to the patient again over the next 60 days.  Date/time of next scheduled RN care management/care coordination outreach:  03/01/23 @ 1:15pm  Estanislado Emms RN, BSN [place]  Value-Based Care [facility] Health RN Care Coordinator [phone]

## 2022-12-27 NOTE — Patient Instructions (Signed)
Visit Information  Ms. Mcnear was given information about Medicaid Managed Care team care coordination services as a part of their [facility] Community Plan Medicaid benefit. Stefana L Roanhorse verbally consented to engagement with the [facility] Managed Care team.   If you are experiencing a medical emergency, please call 911 or report to your local emergency department or urgent care.   If you have a non-emergency medical problem during routine business hours, please contact your provider's office and ask to speak with a nurse.   For questions related to your [facility], please call: [phone] or visit the homepage here: [email or web address]  If you would like to schedule transportation through your [facility], please call the following number at least 2 days in advance of your appointment: [phone]   Rides for urgent appointments can also be made after hours by calling Member Services.  Call the Behavioral Health Crisis Line at [phone], at any time, 24 hours a day, 7 days a week. If you are in danger or need immediate medical attention call 911.  If you would like help to quit smoking, call 1-800-QUIT-NOW (5730613560) OR Espaol: 1-855-Djelo-Ya (3-474-259-5638) o para ms informacin haga clic aqu or Text READY to 756-433 to register via text  Ms. Herringshaw,   Please see education materials related to diabetes provided as Financial risk analyst.   The patient verbalized understanding of instructions, educational materials, and care plan provided today and agreed to receive a mailed copy of patient instructions, educational materials, and care plan.   Telephone follow up appointment with Managed Medicaid care management team member scheduled for:03/01/23 @ 1:15pm  Estanislado Emms RN, BSN [place]  Value-Based Care [facility] Health RN Care  Coordinator [phone]   Following is a copy of your plan of care:  Care Plan : RN Care Manager Plan of Care  Updates made by Heidi Dach, RN since 12/27/2022 12:00 AM     Problem: Health Management needs related to DMII      Long-Range Goal: Development of Plan of Care to address Health Management needs related to DMII   Start Date: 11/24/2022  Expected End Date: 02/22/2023  Note:   Current Barriers:  Chronic Disease Management support and education needs related to DMII  RNCM Clinical Goal(s):  Patient will verbalize understanding of plan for management of DMII as evidenced by patient reports take all medications exactly as prescribed and will call provider for medication related questions as evidenced by patient reports    attend all scheduled medical appointments: 01/11/23 with PCP  as evidenced by provider documentation in EMR        continue to work with RN Care Manager and/or Social Worker to address care management and care coordination needs related to DMII as evidenced by adherence to CM Team Scheduled appointments     work with Child psychotherapist to address Financial constraints related to no income and Limited access to food related to the management of DMII as evidenced by review of EMR and patient or Child psychotherapist report     through collaboration with Medical illustrator, provider, and care team.   Interventions: Inter-disciplinary care team collaboration (see longitudinal plan of care) Evaluation of current treatment plan related to  self management and patient's adherence to plan as established by provider   Diabetes:  (Status: Goal on Track (progressing): YES.) Long Term Goal   Lab Results  Component Value Date   HGBA1C 11.7 (H) 11/05/2022   @  Assessed patient's understanding of A1c goal: <7% Provided education to patient about basic DM disease process; Reviewed medications with patient and discussed importance of medication adherence;        Reviewed prescribed  diet with patient MyPlate method, decrease carbohydrates, sweets and sweet drinks; Discussed plans with patient for ongoing care management follow up and provided patient with direct contact information for care management team;      Reviewed scheduled/upcoming provider appointments including: 01/11/23 with Dr. Caralee Ates  ;         Review of patient status, including review of consultants reports, relevant laboratory and other test results, and medications completed;       Assessed social determinant of health barriers;        Provided patient with [facility] transportation [phone], advised to call today to arrange transportation on 9/10, address and phone number provided Advised patient to contact [facility] member services [phone] for member benefitss(fresh fruit and vegetables)-revisited Discussed disability application-patient waiting on determination Advised patient to call [facility] to check on prescription glasses Advised patient to journal BS readings and take to next PCP appointment  Patient Goals/Self-Care Activities: Take medications as prescribed   Attend all scheduled provider appointments Call provider office for new concerns or questions  Work with the social worker to address care coordination needs and will continue to work with the clinical team to address health care and disease management related needs schedule appointment with eye doctor drink 6 to 8 glasses of water each day fill half of plate with vegetables limit fast food meals to no more than 1 per week

## 2022-12-28 DIAGNOSIS — R002 Palpitations: Secondary | ICD-10-CM | POA: Diagnosis not present

## 2022-12-28 DIAGNOSIS — I4892 Unspecified atrial flutter: Secondary | ICD-10-CM | POA: Diagnosis not present

## 2022-12-28 DIAGNOSIS — Z79899 Other long term (current) drug therapy: Secondary | ICD-10-CM | POA: Diagnosis not present

## 2022-12-28 DIAGNOSIS — I4891 Unspecified atrial fibrillation: Secondary | ICD-10-CM | POA: Diagnosis not present

## 2022-12-29 LAB — BASIC METABOLIC PANEL
BUN/Creatinine Ratio: 16 (ref 9–23)
BUN: 23 mg/dL — ABNORMAL HIGH (ref 6–20)
CO2: 27 mmol/L (ref 20–29)
Calcium: 8.2 mg/dL — ABNORMAL LOW (ref 8.7–10.2)
Chloride: 102 mmol/L (ref 96–106)
Creatinine, Ser: 1.44 mg/dL — ABNORMAL HIGH (ref 0.57–1.00)
Glucose: 148 mg/dL — ABNORMAL HIGH (ref 70–99)
Potassium: 4.5 mmol/L (ref 3.5–5.2)
Sodium: 146 mmol/L — ABNORMAL HIGH (ref 134–144)
eGFR: 48 mL/min/{1.73_m2} — ABNORMAL LOW (ref >59)

## 2022-12-29 LAB — DIGOXIN LEVEL: Digoxin, Serum: 0.4 ng/mL — ABNORMAL LOW (ref 0.5–0.9)

## 2023-01-06 ENCOUNTER — Telehealth: Payer: Self-pay

## 2023-01-06 NOTE — Telephone Encounter (Signed)
..   Medicaid Managed Care   Unsuccessful Outreach Note  01/06/2023 Name: Molly Howard MRN: [id number] DOB: 27-Apr-1986  Referred by: Margarita Mail, DO Reason for referral : Appointment (I called the patient to reschedule her missed phone appt with the MM BSW. I had to leave a message on her VM.)   A second unsuccessful telephone outreach was attempted today. The patient was referred to the case management team for assistance with care management and care coordination.   Follow Up Plan: A HIPAA compliant phone message was left for the patient providing contact information and requesting a return call.  The care management team will reach out to the patient again over the next 7 days.   Weston Settle Care Guide  [facility] Managed  Care Guide [facility]  [phone]

## 2023-01-07 ENCOUNTER — Other Ambulatory Visit: Payer: Self-pay

## 2023-01-10 ENCOUNTER — Ambulatory Visit (HOSPITAL_BASED_OUTPATIENT_CLINIC_OR_DEPARTMENT_OTHER): Payer: Medicaid Other | Admitting: Family

## 2023-01-10 ENCOUNTER — Encounter: Payer: Self-pay | Admitting: Family

## 2023-01-10 ENCOUNTER — Other Ambulatory Visit
Admission: RE | Admit: 2023-01-10 | Discharge: 2023-01-10 | Disposition: A | Discharge status: Home / Self Care | Payer: Medicaid Other | Source: Ambulatory Visit | Attending: Family | Admitting: Family

## 2023-01-10 VITALS — BP 124/80 | HR 73 | Ht 67.0 in | Wt 214.0 lb

## 2023-01-10 DIAGNOSIS — I110 Hypertensive heart disease with heart failure: Secondary | ICD-10-CM | POA: Diagnosis not present

## 2023-01-10 DIAGNOSIS — Z5982 Transportation insecurity: Secondary | ICD-10-CM | POA: Insufficient documentation

## 2023-01-10 DIAGNOSIS — I5082 Biventricular heart failure: Secondary | ICD-10-CM | POA: Diagnosis not present

## 2023-01-10 DIAGNOSIS — Z87891 Personal history of nicotine dependence: Secondary | ICD-10-CM | POA: Insufficient documentation

## 2023-01-10 DIAGNOSIS — I10 Essential (primary) hypertension: Secondary | ICD-10-CM

## 2023-01-10 DIAGNOSIS — Z833 Family history of diabetes mellitus: Secondary | ICD-10-CM | POA: Diagnosis not present

## 2023-01-10 DIAGNOSIS — I5022 Chronic systolic (congestive) heart failure: Secondary | ICD-10-CM

## 2023-01-10 DIAGNOSIS — E1165 Type 2 diabetes mellitus with hyperglycemia: Secondary | ICD-10-CM

## 2023-01-10 DIAGNOSIS — Z5986 Financial insecurity: Secondary | ICD-10-CM | POA: Diagnosis not present

## 2023-01-10 DIAGNOSIS — I428 Other cardiomyopathies: Secondary | ICD-10-CM | POA: Diagnosis not present

## 2023-01-10 DIAGNOSIS — D649 Anemia, unspecified: Secondary | ICD-10-CM | POA: Diagnosis not present

## 2023-01-10 DIAGNOSIS — Z794 Long term (current) use of insulin: Secondary | ICD-10-CM | POA: Diagnosis not present

## 2023-01-10 DIAGNOSIS — Z79899 Other long term (current) drug therapy: Secondary | ICD-10-CM | POA: Diagnosis not present

## 2023-01-10 DIAGNOSIS — D509 Iron deficiency anemia, unspecified: Secondary | ICD-10-CM | POA: Diagnosis not present

## 2023-01-10 DIAGNOSIS — Z8249 Family history of ischemic heart disease and other diseases of the circulatory system: Secondary | ICD-10-CM | POA: Insufficient documentation

## 2023-01-10 DIAGNOSIS — Z7984 Long term (current) use of oral hypoglycemic drugs: Secondary | ICD-10-CM | POA: Diagnosis not present

## 2023-01-10 DIAGNOSIS — E119 Type 2 diabetes mellitus without complications: Secondary | ICD-10-CM | POA: Insufficient documentation

## 2023-01-10 LAB — BASIC METABOLIC PANEL
Anion gap: 9 (ref 5–15)
BUN: 34 mg/dL — ABNORMAL HIGH (ref 6–20)
CO2: 26 mmol/L (ref 22–32)
Calcium: 8.2 mg/dL — ABNORMAL LOW (ref 8.9–10.3)
Chloride: 103 mmol/L (ref 98–111)
Creatinine, Ser: 1.25 mg/dL — ABNORMAL HIGH (ref 0.44–1.00)
GFR, Estimated: 57 mL/min — ABNORMAL LOW (ref >60)
Glucose, Bld: 219 mg/dL — ABNORMAL HIGH (ref 70–99)
Potassium: 3.9 mmol/L (ref 3.5–5.1)
Sodium: 138 mmol/L (ref 135–145)

## 2023-01-10 LAB — CBC
HCT: 36.3 % (ref 36.0–46.0)
Hemoglobin: 10.9 g/dL — ABNORMAL LOW (ref 12.0–15.0)
MCH: 27.8 pg (ref 26.0–34.0)
MCHC: 30 g/dL (ref 30.0–36.0)
MCV: 92.6 fL (ref 80.0–100.0)
Platelets: 262 10*3/uL (ref 150–400)
RBC: 3.92 MIL/uL (ref 3.87–5.11)
RDW: 15.5 % (ref 11.5–15.5)
WBC: 6 10*3/uL (ref 4.0–10.5)
nRBC: 0 % (ref 0.0–0.2)

## 2023-01-10 LAB — MAGNESIUM: Magnesium: 1.9 mg/dL (ref 1.7–2.4)

## 2023-01-10 NOTE — Progress Notes (Signed)
PCP: Margarita Mail, DO (last seen 08/24) Primary Cardiologist: Debbe Odea, MD (last seen 08/24)  HPI:  Molly Howard is a 37 y/o female with a history of DM, HTN, previous drug use and chronic biventricular heart failure.   Admitted 02/09/22 due to acute on chronic HF. + cocaine. Discharged after 7 days. Admitted 07/24/22 due to decompensated HFrEF. Received IV Lasix. Was on milrinone drip. Digoxin added. Uop about 41 liters with decrease in weight 116 kg >>91 kg. Admitted 11/05/22 due to swelling and fluid retention in arms and legs along with some central chest tightness and shortness of breath and all has been going on for about a week. Needed lasix gtt with milrinone and diuresing well,  total UOP >40 liters. Weight significantly down 130 kg-->97 kg. Weaned off of IV drips. Spironolactone stopped due to hyperkalemia. Urine drug screen + for cocaine. Negative for dvt/stenosis. X-ray nothing acute to explain left arm swelling.    Echo 10/11/20: EF 30-35% along with moderate TR.  Echo 03/20/21: EF 25-30% along with severely elevated PA pressure of 70.8 mmHg, mild LAE, mild MR and mild/moderate TR Echo 06/30/21: EF  30-35% along with mild MR. Echo 09/18/21: EF  25-30%.   Echo 07/25/22: EF 25-30% with moderately elevated PA pressure of 50.7 mmHg, mild LAE, mild MR, small pericardial effusion and moderate/ severe TR  Cardiac MRI 05/19/21:  1. Mild LV dilatation, mild hypertrophy, and mild systolic dysfunction (EF 42%)  2.  No late gadolinium enhancement to suggest myocardial scar  3.  Normal RV size and systolic function (EF 50%)  She presents today for a follow-up visit with a chief complaint of minimal shortness of breath with moderate exertion. Chronic in nature. Has associated fatigue, intermittent chest pain, palpitations and intermittent dizziness along with this. Denies cough, abdominal swelling, pedal edema, weight gain or difficulty sleeping. Reports sleeping well on 2 pillows. Has worn a  holter X 2 weeks. Just removed it yesterday and has to mail it back.   Says that she's been out of her entresto for the ~ 3 days because she has to go pick it up. She is asking if she can participate in the paramedicine program once again. She is wanting to keep herself out of the hospital and says that when her mom died ~ 2023/05/10, she just let herself and everything else go by the wayside until she couldn't go any further and had to come to the hospital.   ROS: All systems negative except as listed in HPI, PMH and Problem List.  SH:  Social History   Socioeconomic History   Marital status: Single    Spouse name: Not on file   Number of children: Not on file   Years of education: Not on file   Highest education level: Not on file  Occupational History   Not on file  Tobacco Use   Smoking status: Former    Current packs/day: 0.00    Types: Cigarettes    Quit date: 2022    Years since quitting: 2.6   Smokeless tobacco: Never  Vaping Use   Vaping status: Never Used  Substance and Sexual Activity   Alcohol use: Not Currently    Comment: ~ 4 shots/day on weekends only   Drug use: Not Currently    Types: Cocaine    Comment: Admits to using cocaine up and will February 2024.   Sexual activity: Not Currently    Birth control/protection: None  Other Topics Concern   Not  on file  Social History Narrative   Lives locally.  Does not routinely exercise.  Has been using cocaine.   Social Determinants of Health   Financial Resource Strain: High Risk (06/16/2020)   Overall Financial Resource Strain (CARDIA)    Difficulty of Paying Living Expenses: Hard  Food Insecurity: Food Insecurity Present (11/24/2022)   Hunger Vital Sign    Worried About Running Out of Food in the Last Year: Sometimes true    Ran Out of Food in the Last Year: Sometimes true  Transportation Needs: Unmet Transportation Needs (11/24/2022)   PRAPARE - Administrator, Civil Service (Medical): Yes    Lack of  Transportation (Non-Medical): Yes  Physical Activity: Not on file  Stress: Not on file  Social Connections: Not on file  Intimate Partner Violence: Not At Risk (11/06/2022)   Humiliation, Afraid, Rape, and Kick questionnaire    Fear of Current or Ex-Partner: No    Emotionally Abused: No    Physically Abused: No    Sexually Abused: No    FH:  Family History  Problem Relation Age of Onset   Heart failure Mother        Onset of heart failure 40s.  Died in 04/23/22.   Diabetes Mother    Hypertension Father    Diabetes Father     Past Medical History:  Diagnosis Date   Acid reflux    Chronic HFrEF (heart failure with reduced ejection fraction) (HCC)    a. 10/2019 Echo: EF 35-40%, GrII DD; b. 05/2020 Echo: EF 20-25%, glob HK; c. 10/2020 Echo: EF 30-35%, glob HK. GrII DD, Mildly red RV fxn. Mod TR; d.  05/2021 cMRI: EF 42%, no LGE. Nl RV size/fxn.   CKD (chronic kidney disease), stage II    Diabetes mellitus (HCC)    H/O medication noncompliance    Hypertension    Microcytic anemia    NICM (nonischemic cardiomyopathy) (HCC)    a. 10/2019 Echo: EF 35-40%; b. 10/2019 MV: No ischemia. Small apical defect-->breast attenuation; c. 05/2020 Echo: EF 20-25%; d. 10/2020 Echo: EF 30-35%, glob HK. GrII DD, Mildly red RV fxn. Mod TR; e. 05/2021 cMRI: EF 42%, no LGE. Nl RV size/fxn.   Obesity    Polysubstance abuse (HCC)     Current Outpatient Medications  Medication Sig Dispense Refill   Accu-Chek Softclix Lancets lancets Use as instructed 100 each 12   atorvastatin (LIPITOR) 20 MG tablet Take 1 tablet (20 mg total) by mouth daily. 90 tablet 3   calcium carbonate (OS-CAL - DOSED IN MG OF ELEMENTAL CALCIUM) 1250 (500 Ca) MG tablet Take 1 tablet (1,250 mg total) by mouth 3 (three) times daily with meals. 60 tablet 0   carvedilol (COREG) 3.125 MG tablet Take 1 tablet (3.125 mg total) by mouth 2 (two) times daily with a meal. 60 tablet 3   dapagliflozin propanediol (FARXIGA) 10 MG TABS tablet Take 1  tablet (10 mg total) by mouth once daily. 90 tablet 1   digoxin (LANOXIN) 0.125 MG tablet Take 0.5 tablets (0.0625 mg total) by mouth daily. 30 tablet 3   ferrous sulfate 325 (65 FE) MG tablet Take 1 tablet (325 mg total) by mouth daily with breakfast. 30 tablet 3   insulin glargine (LANTUS) 100 UNIT/ML Solostar Pen Inject 15 Units into the skin 2 (two) times daily. 15 mL 11   nitroGLYCERIN (NITROSTAT) 0.4 MG SL tablet Place 1 tablet (0.4 mg total) under the tongue every 5 (five) minutes as  needed for chest pain. 25 tablet 12   sacubitril-valsartan (ENTRESTO) 49-51 MG Take 1 tablet by mouth 2 (two) times daily. 180 tablet 3   tirzepatide (MOUNJARO) 2.5 MG/0.5ML Pen Inject 2.5 mg into the skin once a week. 2 mL 0   torsemide (DEMADEX) 20 MG tablet Take 2 tablets (40 mg total) by mouth in the morning and at bedtime. 120 tablet 2   No current facility-administered medications for this visit.   Vitals:   01/10/23 1554  BP: 124/80  Pulse: 73  SpO2: 100%  Weight: 214 lb (97.1 kg)  Height: 5\' 7"  (1.702 m)   Wt Readings from Last 3 Encounters:  01/10/23 214 lb (97.1 kg)  12/21/22 209 lb (94.8 kg)  12/14/22 221 lb 8 oz (100.5 kg)   Lab Results  Component Value Date   CREATININE 1.44 (H) 12/28/2022   CREATININE 1.45 (H) 12/14/2022   CREATININE 1.66 (H) 11/16/2022   PHYSICAL EXAM:  General:  Well appearing. No resp difficulty HEENT: normal Neck: supple. JVP flat. No lymphadenopathy or thryomegaly appreciated. Cor: PMI normal. Regular rate & rhythm. No rubs, gallops or murmurs. Lungs: clear Abdomen: soft, nontender, nondistended. No hepatosplenomegaly. No bruits or masses.  Extremities: no cyanosis, clubbing, rash, edema Neuro: alert & orientedx3, cranial nerves grossly intact. Moves all 4 extremities w/o difficulty. Affect pleasant.   ECG: not done   ASSESSMENT & PLAN:  1: NICM with reduced ejection fraction- - suspect due to cocaine use and uncontrolled HTN - NYHA class II -  euvolemic - not weighing daily; reminded to call for an overnight weight gain of > 2 pounds or a weekly weight gain of > 5 pounds - weight down 30 pounds from last visit here 8 months ago - Echo 10/11/20: EF 30-35% along with moderate TR.  - Echo 03/20/21: EF 25-30% along with severely elevated PA pressure of 70.8 mmHg, mild LAE, mild MR and mild/moderate TR - Echo 06/30/21: EF  30-35% along with mild MR. - Echo 09/18/21: EF  25-30%.   - Echo 07/25/22: EF 25-30% with moderately elevated PA pressure of 50.7 mmHg, mild LAE, mild MR, small pericardial effusion and moderate/ severe TR - Cardiac MRI 05/19/21:    1. Mild LV dilatation, mild hypertrophy, and mild systolic   dysfunction (EF 42%)    2. No late gadolinium enhancement to suggest myocardial scar   3. Normal RV size and systolic function (EF 50%) - saw cardiology ([place]) 08/24 - saw EP Lalla Brothers) 06/23; was to have ICD implanted 10/12/21 but it was cancelled due to anemia - continue carvedilol 3.125mg  BID - continue entresto 49/51mg  BID (she will go pick it up today) - continue farxiga 10mg  daily - continue digoxin 0.0625mg  daily; dig level 11/06/22 was 0.3 - continue torsemide 40mg  BID - spironolactone had been stopped due to hyperkalemia and concern of appropriate f/u - BMP/ Mg today - not a candidate for advanced therapies due to repeated + drug screens - will make another referral to paramedicine program to get her back in so they can help assist with medication compliance - BNP 11/05/22 was 1267.5  2: HTN- - BP 124/80 - saw PCP Caralee Ates) 08/24 - BMP 12/28/22 showed sodium 146, potassium 4.5, creatinine 1.44 & GFR 48 - BMP today  3: DM- - A1c 11/05/22 was 9.6%  4: Anemia- - Hg 11/08/22 showed hemoglobin 10.2 - CBC today  Return in 1 month, sooner if needed.

## 2023-01-10 NOTE — Progress Notes (Unsigned)
Established Patient Office Visit  Subjective:  Patient ID: Molly Howard, female    DOB: [birth year]  Age: 37 y.o. MRN: [id number]  CC:  No chief complaint on file.   HPI Molly Howard presents for follow up.   Hypertension/HFrEF/NICM: -Medications: Torsemide 40 mg BID, Carvedilol 3.125 mg BID, Entresto, Farxiga 10 mg, Digoxin 0.125 mg  -Has been compliant with her medications for the last month and doing well overall -Follows with Cardiology, last seen 01/09/23 -Last echo 07/25/22 showing reduced EF at 25-30% with global hypokinesis of the left ventricle   Diabetes Type 2: -Last A1c 7/24 11.7% -Medications: Fargixa 10 mg, Lantus changed to 20 units BID, started on Mounjaro at LOV  -Patient states she is general compliant with the above medications and reports no side effects.  -Checking BG at home: fasting 250. Post-prandial 140 -Eye exam: Up-to-date 7/24 -Foot exam: Up-to-date 7/24 -Microalbumin: UTD 6/24 -Statin: yes  -PNA vaccine: Prevnar 23 in 7/22 -Denies symptoms of hypoglycemia, polyuria, polydipsia, numbness extremities, foot ulcers/trauma.   HLD: -Medications: Lipitor 20 mg  -Patient is compliant with above medications and reports no side effects.  -Last lipid panel: Lipid Panel     Component Value Date/Time   CHOL 216 (H) 05/27/2022 1022   TRIG 124 05/27/2022 1022   HDL 46 05/27/2022 1022   CHOLHDL 4.7 05/27/2022 1022   VLDL 25 05/27/2022 1022   LDLCALC 145 (H) 05/27/2022 1022   LDLCALC 171 (H) 06/12/2021 1354   GERD: -Not currently on daily medications, taking Pepcid as needed   CKD/AoCD: -Last creatinine 11/16/2022 1.66 -Last hgb 10.2 11/08/22    Past Medical History:  Diagnosis Date   Acid reflux    Chronic HFrEF (heart failure with reduced ejection fraction) (HCC)    a. 10/2019 Echo: EF 35-40%, GrII DD; b. 05/2020 Echo: EF 20-25%, glob HK; c. 10/2020 Echo: EF 30-35%, glob HK. GrII DD, Mildly red RV fxn. Mod TR; d.  05/2021 cMRI: EF 42%, no LGE. Nl RV  size/fxn.   CKD (chronic kidney disease), stage II    Diabetes mellitus (HCC)    H/O medication noncompliance    Hypertension    Microcytic anemia    NICM (nonischemic cardiomyopathy) (HCC)    a. 10/2019 Echo: EF 35-40%; b. 10/2019 MV: No ischemia. Small apical defect-->breast attenuation; c. 05/2020 Echo: EF 20-25%; d. 10/2020 Echo: EF 30-35%, glob HK. GrII DD, Mildly red RV fxn. Mod TR; e. 05/2021 cMRI: EF 42%, no LGE. Nl RV size/fxn.   Obesity    Polysubstance abuse [facility])     Past Surgical History:  Procedure Laterality Date   CHOLECYSTECTOMY     HERNIA REPAIR     RIGHT HEART CATH N/A 07/28/2022   Procedure: RIGHT HEART CATH;  Surgeon: Iran Ouch, MD;  Location: [facility] INVASIVE CV LAB;  Service: Cardiovascular;  Laterality: N/A;    Family History  Problem Relation Age of Onset   Heart failure Mother        Onset of heart failure 40s.  Died in April 23, 2022.   Diabetes Mother    Hypertension Father    Diabetes Father     Social History   Socioeconomic History   Marital status: Single    Spouse name: Not on file   Number of children: Not on file   Years of education: Not on file   Highest education level: Not on file  Occupational History   Not on file  Tobacco Use   Smoking status:  Former    Current packs/day: 0.00    Types: Cigarettes    Quit date: 2022    Years since quitting: 2.6   Smokeless tobacco: Never  Vaping Use   Vaping status: Never Used  Substance and Sexual Activity   Alcohol use: Not Currently    Comment: ~ 4 shots/day on weekends only   Drug use: Not Currently    Types: Cocaine    Comment: Admits to using cocaine up and will February 2024.   Sexual activity: Not Currently    Birth control/protection: None  Other Topics Concern   Not on file  Social History Narrative   Lives locally.  Does not routinely exercise.  Has been using cocaine.   Social Determinants of Health   Financial Resource Strain: High Risk (06/16/2020)   Overall Financial  Resource Strain (CARDIA)    Difficulty of Paying Living Expenses: Hard  Food Insecurity: Food Insecurity Present (11/24/2022)   Hunger Vital Sign    Worried About Running Out of Food in the Last Year: Sometimes true    Ran Out of Food in the Last Year: Sometimes true  Transportation Needs: Unmet Transportation Needs (11/24/2022)   PRAPARE - Administrator, Civil Service (Medical): Yes    Lack of Transportation (Non-Medical): Yes  Physical Activity: Not on file  Stress: Not on file  Social Connections: Not on file  Intimate Partner Violence: Not At Risk (11/06/2022)   Humiliation, Afraid, Rape, and Kick questionnaire    Fear of Current or Ex-Partner: No    Emotionally Abused: No    Physically Abused: No    Sexually Abused: No    Outpatient Medications Prior to Visit  Medication Sig Dispense Refill   Accu-Chek Softclix Lancets lancets Use as instructed 100 each 12   atorvastatin (LIPITOR) 20 MG tablet Take 1 tablet (20 mg total) by mouth daily. 90 tablet 3   carvedilol (COREG) 3.125 MG tablet Take 1 tablet (3.125 mg total) by mouth 2 (two) times daily with a meal. 60 tablet 3   dapagliflozin propanediol (FARXIGA) 10 MG TABS tablet Take 1 tablet (10 mg total) by mouth once daily. 90 tablet 1   digoxin (LANOXIN) 0.125 MG tablet Take 0.5 tablets (0.0625 mg total) by mouth daily. 30 tablet 3   ferrous sulfate 325 (65 FE) MG tablet Take 1 tablet (325 mg total) by mouth daily with breakfast. 30 tablet 3   insulin glargine (LANTUS) 100 UNIT/ML Solostar Pen Inject 15 Units into the skin 2 (two) times daily. 15 mL 11   nitroGLYCERIN (NITROSTAT) 0.4 MG SL tablet Place 1 tablet (0.4 mg total) under the tongue every 5 (five) minutes as needed for chest pain. 25 tablet 12   sacubitril-valsartan (ENTRESTO) 49-51 MG Take 1 tablet by mouth 2 (two) times daily. (Patient not taking: Reported on 01/10/2023) 180 tablet 3   tirzepatide (MOUNJARO) 2.5 MG/0.5ML Pen Inject 2.5 mg into the skin once a  week. 2 mL 0   torsemide (DEMADEX) 20 MG tablet Take 2 tablets (40 mg total) by mouth in the morning and at bedtime. 120 tablet 2   No facility-administered medications prior to visit.    Allergies  Allergen Reactions   No [facility] Food Allergies Rash and Other (See Comments)    Lemons    ROS Review of Systems  Constitutional:  Negative for chills and fever.  Respiratory:  Negative for cough and shortness of breath.   Cardiovascular:  Negative for chest pain and leg swelling.  Gastrointestinal:  Negative for diarrhea, nausea and vomiting.  Skin: Negative.       Objective:    Physical Exam Constitutional:      Appearance: Normal appearance.  HENT:     Head: Normocephalic and atraumatic.  Eyes:     Conjunctiva/sclera: Conjunctivae normal.  Cardiovascular:     Rate and Rhythm: Normal rate and regular rhythm.     Pulses:          Dorsalis pedis pulses are 2+ on the right side and 2+ on the left side.  Pulmonary:     Effort: Pulmonary effort is normal.     Breath sounds: Normal breath sounds. No wheezing, rhonchi or rales.  Musculoskeletal:     Right lower leg: No edema.     Left lower leg: No edema.     Right foot: Normal range of motion. No deformity, bunion, Charcot foot, foot drop or prominent metatarsal heads.     Left foot: Normal range of motion. No deformity, bunion, Charcot foot, foot drop or prominent metatarsal heads.  Feet:     Right foot:     Protective Sensation: 6 sites tested.  6 sites sensed.     Skin integrity: Skin integrity normal.     Toenail Condition: Right toenails are abnormally thick.     Left foot:     Protective Sensation: 6 sites tested.  6 sites sensed.     Skin integrity: Skin integrity normal.     Toenail Condition: Left toenails are abnormally thick.  Skin:    General: Skin is warm and dry.  Neurological:     General: No focal deficit present.     Mental Status: She is alert. Mental status is at baseline.  Psychiatric:         Mood and Affect: Mood normal.        Behavior: Behavior normal.     There were no vitals taken for this visit. Wt Readings from Last 3 Encounters:  01/10/23 214 lb (97.1 kg)  12/21/22 209 lb (94.8 kg)  12/14/22 221 lb 8 oz (100.5 kg)     Health Maintenance Due  Topic Date Due   COVID-19 Vaccine (1) Never done   INFLUENZA VACCINE  12/02/2022    There are no preventive care reminders to display for this patient.  Lab Results  Component Value Date   TSH 5.114 (H) 11/09/2021   Lab Results  Component Value Date   WBC 6.0 01/10/2023   HGB 10.9 (L) 01/10/2023   HCT 36.3 01/10/2023   MCV 92.6 01/10/2023   PLT 262 01/10/2023   Lab Results  Component Value Date   NA 138 01/10/2023   K 3.9 01/10/2023   CO2 26 01/10/2023   GLUCOSE 219 (H) 01/10/2023   BUN 34 (H) 01/10/2023   CREATININE 1.25 (H) 01/10/2023   BILITOT 0.7 11/06/2022   ALKPHOS 167 (H) 11/06/2022   AST 18 11/06/2022   ALT 11 11/06/2022   PROT 6.0 (L) 11/06/2022   ALBUMIN 1.8 (L) 11/06/2022   CALCIUM 8.2 (L) 01/10/2023   ANIONGAP 9 01/10/2023   EGFR 48 (L) 12/28/2022   Lab Results  Component Value Date   CHOL 216 (H) 05/27/2022   Lab Results  Component Value Date   HDL 46 05/27/2022   Lab Results  Component Value Date   LDLCALC 145 (H) 05/27/2022   Lab Results  Component Value Date   TRIG 124 05/27/2022   Lab Results  Component Value Date  CHOLHDL 4.7 05/27/2022   Lab Results  Component Value Date   HGBA1C 11.7 (H) 11/05/2022      Assessment & Plan:   1. 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  Will increase Lantus to 20 units twice daily, continue Farxiga 10 mg.  Will prescribe Mounjaro 2.5 mg weekly and attempt to better control diabetes without having to continue to increase insulin.  Will follow-up in 1 month on medication.  - dapagliflozin propanediol (FARXIGA) 10 MG TABS  tablet; Take 1 tablet (10 mg total) by mouth once daily.  Dispense: 90 tablet; Refill: 1 - tirzepatide (MOUNJARO) 2.5 MG/0.5ML Pen; Inject 2.5 mg into the skin once a week.  Dispense: 2 mL; Refill: 0  2. Essential hypertension/HFrEF (heart failure with reduced ejection fraction) (HCC)/NICM (nonischemic cardiomyopathy) [facility]): Doing much better now that she is compliant with her medications, will refill Coreg.  Following up with cardiology next week. Recheck kidney function and potassium today.  - carvedilol (COREG) 3.125 MG tablet; Take 1 tablet (3.125 mg total) by mouth 2 (two) times daily with a meal.  Dispense: 60 tablet; Refill: 3 - Basic Metabolic Panel (BMET)   Follow-up: No follow-ups on file.    Margarita Mail, DO

## 2023-01-10 NOTE — Patient Instructions (Addendum)
Go to the [facility] pharmacy across the street and pick up your entresto  Go to the [facility] to have your blood work completed

## 2023-01-11 ENCOUNTER — Encounter: Payer: Self-pay | Admitting: Internal Medicine

## 2023-01-11 ENCOUNTER — Ambulatory Visit
Admission: RE | Admit: 2023-01-11 | Discharge: 2023-01-11 | Disposition: A | Discharge status: Home / Self Care | Payer: Medicaid Other | Attending: Internal Medicine | Admitting: Internal Medicine

## 2023-01-11 ENCOUNTER — Ambulatory Visit (INDEPENDENT_AMBULATORY_CARE_PROVIDER_SITE_OTHER): Payer: Medicaid Other | Admitting: Internal Medicine

## 2023-01-11 ENCOUNTER — Ambulatory Visit
Admission: RE | Admit: 2023-01-11 | Discharge: 2023-01-11 | Disposition: A | Discharge status: Home / Self Care | Payer: Medicaid Other | Source: Ambulatory Visit | Attending: Internal Medicine

## 2023-01-11 VITALS — BP 124/82 | HR 75 | Temp 98.1°F | Ht 67.0 in | Wt 215.1 lb

## 2023-01-11 DIAGNOSIS — I5020 Unspecified systolic (congestive) heart failure: Secondary | ICD-10-CM

## 2023-01-11 DIAGNOSIS — E1129 Type 2 diabetes mellitus with other diabetic kidney complication: Secondary | ICD-10-CM | POA: Diagnosis not present

## 2023-01-11 DIAGNOSIS — Z794 Long term (current) use of insulin: Secondary | ICD-10-CM

## 2023-01-11 DIAGNOSIS — M25561 Pain in right knee: Secondary | ICD-10-CM

## 2023-01-11 DIAGNOSIS — Z23 Encounter for immunization: Secondary | ICD-10-CM

## 2023-01-11 NOTE — Patient Instructions (Addendum)
It was great seeing you today!  Plan discussed at today's visit: -Referral placed to pharmacy today to discuss [facility] cost -For now increase Lantus to 25 units twice a day  -Knee x-ray today, can use over the counter Diclofenac or Voltaren gel for pain  -Please call [phone] to [facility] to schedule an appointment -Pneumonia and flu vaccines today  Follow up in: 1 month   Take care and let us know if you have any questions or concerns prior to your next visit.  Dr. Caralee Ates

## 2023-01-12 ENCOUNTER — Other Ambulatory Visit: Payer: Medicaid Other | Admitting: Pharmacist

## 2023-01-12 NOTE — Progress Notes (Signed)
01/12/2023 Name: Molly Howard MRN: [id number] DOB: 1985/12/24  Chief Complaint  Patient presents with   Medication Access    Keith L Buckles is a 37 y.o. year old female who presented for a telephone visit.   They were referred to the pharmacist by their PCP for assistance in managing medication access.   Per referral from PCP, patient needs help with affording Mounjaro  Unable to review medications today; will plan to complete medication review during our next telephone call.  Subjective:  Care Team: Primary Care Provider: Margarita Mail, DO Social Worker: Shaune Leeks; Next Scheduled Visit: 01/19/2023 Nurse Care Manager: Heidi Dach, RN; Next Schedule Visit: 03/01/2023 Cardiologist: Donald Siva; Next Schedule Visit: 02/04/2023 Heart Failure Specialist: Delma Freeze, FNP; Next Schedule Visit: 02/09/2023  Medication Access/Adherence  Current Pharmacy:  [facility] [address] [place] [address] Phone: [phone] Fax: [phone]   Patient reports affordability concerns with their medications: Yes  Patient reports access/transportation concerns to their pharmacy: No  Patient reports adherence concerns with their medications:  No    Today patient reports that she ran out of Entresto and digoxin this week and had not yet picked up from pharmacy as her Medicaid copayments are sometimes difficult to afford.    Diabetes:  Current medications:  - Lantus 25 units twice daily (reports increased to this dose today) - Farxiga 10 mg daily  Medications tried in the past: Mounjaro (cost)  Current glucose readings: last checked this morning before breakfast, reading ~200 - Reports recent blood sugar readings have been elevated, but advises just made adjustment to her Lantus dosing today (as recommended from office visit with PCP yesterday)   Objective:  Lab Results  Component Value Date   HGBA1C 11.7  (H) 11/05/2022    Lab Results  Component Value Date   CREATININE 1.25 (H) 01/10/2023   BUN 34 (H) 01/10/2023   NA 138 01/10/2023   K 3.9 01/10/2023   CL 103 01/10/2023   CO2 26 01/10/2023    Outpatient Encounter Medications as of 01/12/2023  Medication Sig   Accu-Chek Softclix Lancets lancets Use as instructed   atorvastatin (LIPITOR) 20 MG tablet Take 1 tablet (20 mg total) by mouth daily.   carvedilol (COREG) 3.125 MG tablet Take 1 tablet (3.125 mg total) by mouth 2 (two) times daily with a meal.   dapagliflozin propanediol (FARXIGA) 10 MG TABS tablet Take 1 tablet (10 mg total) by mouth once daily.   digoxin (LANOXIN) 0.125 MG tablet Take 0.5 tablets (0.0625 mg total) by mouth daily.   ferrous sulfate 325 (65 FE) MG tablet Take 1 tablet (325 mg total) by mouth daily with breakfast.   insulin glargine (LANTUS) 100 UNIT/ML Solostar Pen Inject 15 Units into the skin 2 (two) times daily.   nitroGLYCERIN (NITROSTAT) 0.4 MG SL tablet Place 1 tablet (0.4 mg total) under the tongue every 5 (five) minutes as needed for chest pain.   sacubitril-valsartan (ENTRESTO) 49-51 MG Take 1 tablet by mouth 2 (two) times daily. (Patient not taking: Reported on 01/10/2023)   tirzepatide [employer]) 2.5 MG/0.5ML Pen Inject 2.5 mg into the skin once a week.   torsemide (DEMADEX) 20 MG tablet Take 2 tablets (40 mg total) by mouth in the morning and at bedtime.   [DISCONTINUED] Potassium Chloride 40 MEQ/15ML (20%) SOLN Take 40 mEq by mouth 2 (two) times daily.   No facility-administered encounter medications on file as of 01/12/2023.  Assessment/Plan:   Unable to review medications today; will plan to complete medication review during our next telephone call.  Counsel patient on importance of adherence to heart failure medication regimen. Recommend patient follow up with [facility] today to find out if pharmacy might be able to bill her for her copayment for Entresto and digoxin so that she  can pick up these medications from pharmacy today.  Diabetes: - Currently uncontrolled - Reviewed long term cardiovascular and renal outcomes of uncontrolled blood sugar - Counsel patient on signs and symptoms of hypoglycemia and encourage patient to carry glucose tablets with her to use if needed in case of hypoglycemia - Recommend to check glucose, keep log of results and have this record to review at upcoming medical appointments. Patient to contact provider office sooner if needed for readings outside of established parameters or symptoms    Follow Up Plan: Clinical Pharmacist will follow up with patient by telephone within the next 7 days  Estelle Grumbles, PharmD, [facility] [phone]

## 2023-01-12 NOTE — Patient Instructions (Signed)
Goals Addressed             This Visit's Progress    Pharmacy Goals       Please follow up with [facility] to pick up your medications.  [facility] [place] [facility] [address] [place], [place], [place] [address] [phone]  Thank you!  Estelle Grumbles, PharmD, [facility] [phone]

## 2023-01-13 ENCOUNTER — Other Ambulatory Visit: Payer: Self-pay

## 2023-01-13 ENCOUNTER — Other Ambulatory Visit: Payer: Self-pay | Admitting: Internal Medicine

## 2023-01-13 DIAGNOSIS — E1129 Type 2 diabetes mellitus with other diabetic kidney complication: Secondary | ICD-10-CM

## 2023-01-13 DIAGNOSIS — H5212 Myopia, left eye: Secondary | ICD-10-CM | POA: Diagnosis not present

## 2023-01-13 MED ORDER — OZEMPIC (0.25 OR 0.5 MG/DOSE) 2 MG/3ML ~~LOC~~ SOPN
0.2500 mg | PEN_INJECTOR | SUBCUTANEOUS | 1 refills | Status: DC
Start: 2023-01-13 — End: 2023-02-09
  Filled 2023-01-13 – 2023-01-14 (×2): qty 3, 28d supply, fill #0

## 2023-01-14 ENCOUNTER — Other Ambulatory Visit: Payer: Self-pay

## 2023-01-14 ENCOUNTER — Other Ambulatory Visit: Payer: Medicaid Other | Admitting: Pharmacist

## 2023-01-14 DIAGNOSIS — E1129 Type 2 diabetes mellitus with other diabetic kidney complication: Secondary | ICD-10-CM

## 2023-01-14 MED ORDER — FREESTYLE LIBRE 3 SENSOR MISC
12 refills | Status: DC
  Filled 2023-01-14: qty 2, 28d supply, fill #0
  Filled 2023-04-04 – 2023-06-06 (×3): qty 2, 28d supply, fill #1

## 2023-01-14 NOTE — Patient Instructions (Signed)
Goals Addressed             This Visit's Progress    Pharmacy Goals       Please follow up with [facility] to pick up your medications.  [facility] [place] [facility] [address] [place], [place], [place] [address] [phone]  Start Ozempic 0.25 mg injection weekly as recommended by your doctor. This medication may cause stomach upset, queasiness, or constipation, especially when first starting. This generally improves over time. Call our office if these symptoms occur and worsen, or if you have severe symptoms such as vomiting, diarrhea, or stomach pain.   Please copy and paste the following web address into your web browser for the [employer] 3 Videos:  [email or web address]/us-[email or web address]-[email or web address]   Also, you can reach out to the My[employer] Support at [phone]. Their customer support is available 7 days a week 8 am to 8 pm.  Thank you!  Estelle Grumbles, PharmD, [facility] [phone]

## 2023-01-14 NOTE — Progress Notes (Signed)
01/14/2023 Name: Molly Howard MRN: [id number] DOB: July 31, 1985  Chief Complaint  Patient presents with   Medication Management   Medication access    Molly Howard is a 37 y.o. year old female who presented for a telephone visit.   They were referred to the pharmacist by their PCP for assistance in managing medication access.    Subjective:  Care Team: Primary Care Provider: Margarita Mail, DO Social Worker: Shaune Leeks; Next Scheduled Visit: 01/19/2023 Nurse Care Manager: Heidi Dach, RN; Next Schedule Visit: 03/01/2023 Cardiologist: Donald Siva; Next Schedule Visit: 02/04/2023 Heart Failure Specialist: Delma Freeze, FNP; Next Schedule Visit: 02/09/2023  Medication Access/Adherence  Current Pharmacy:  [facility] [address] [place] [address] Phone: [phone] Fax: [phone]   Patient reports affordability concerns with their medications: Yes  Patient reports access/transportation concerns to their pharmacy: No  Patient reports adherence concerns with their medications:  No     Today denies having yet picked up her Entresto or digoxin as her Medicaid copayments are sometimes difficult to afford  Patient requests to hold off on completing comprehensive medication review until our next telephone appointment     Diabetes:   Current medications:  - Lantus 25 units twice daily - Reports increased to this dose as of 9/11 (s recommended by PCP on 9/10) - Farxiga 10 mg daily   Medications tried in the past: Mounjaro (cost)   Current glucose readings: last checked this morning before breakfast, reading ~200  Patient interested in using continuous glucose monitoring to aid with blood sugar control/feedback on dietary choices  Patient denies symptoms of hypoglycemia  Current dietary habits: - Breakfast: an egg and bacon with 1 piece of toast - Lunch: sandwich with meat, cheese, mayo and  sometimes handful of chips - Supper: varies; hot dog - 1 with but and 1 without with ketchup and mustard and tater tots - Snacks: hot tamales candies - Drinks: water and sometimes a soda (zero sugar cherry coke) Reports following fluid limits from Heart Failure  Reports has been working on making positive dietary changes, including cutting back on fast food consumption     Objective:  Lab Results  Component Value Date   HGBA1C 11.7 (H) 11/05/2022    Lab Results  Component Value Date   CREATININE 1.25 (H) 01/10/2023   BUN 34 (H) 01/10/2023   NA 138 01/10/2023   K 3.9 01/10/2023   CL 103 01/10/2023   CO2 26 01/10/2023    Lab Results  Component Value Date   CHOL 216 (H) 05/27/2022   HDL 46 05/27/2022   LDLCALC 145 (H) 05/27/2022   TRIG 124 05/27/2022   CHOLHDL 4.7 05/27/2022    Outpatient Encounter Medications as of 01/14/2023  Medication Sig   Continuous Glucose Sensor (FREESTYLE LIBRE 3 SENSOR) MISC Place 1 sensor on the skin every 14 days. Use to check glucose continuously   Accu-Chek Softclix Lancets lancets Use as instructed   atorvastatin (LIPITOR) 20 MG tablet Take 1 tablet (20 mg total) by mouth daily.   carvedilol (COREG) 3.125 MG tablet Take 1 tablet (3.125 mg total) by mouth 2 (two) times daily with a meal.   dapagliflozin propanediol (FARXIGA) 10 MG TABS tablet Take 1 tablet (10 mg total) by mouth once daily.   digoxin (LANOXIN) 0.125 MG tablet Take 0.5 tablets (0.0625 mg total) by mouth daily.   ferrous sulfate 325 (65 FE) MG tablet Take 1 tablet (  325 mg total) by mouth daily with breakfast.   insulin glargine (LANTUS) 100 UNIT/ML Solostar Pen Inject 15 Units into the skin 2 (two) times daily. (Patient taking differently: Inject 25 Units into the skin 2 (two) times daily.)   nitroGLYCERIN (NITROSTAT) 0.4 MG SL tablet Place 1 tablet (0.4 mg total) under the tongue every 5 (five) minutes as needed for chest pain.   sacubitril-valsartan (ENTRESTO) 49-51 MG Take 1  tablet by mouth 2 (two) times daily. (Patient not taking: Reported on 01/10/2023)   Semaglutide,0.25 or 0.5MG /DOS, (OZEMPIC, 0.25 OR 0.5 MG/DOSE,) 2 MG/3ML SOPN Inject 0.25 mg into the skin once a week.   torsemide (DEMADEX) 20 MG tablet Take 2 tablets (40 mg total) by mouth in the morning and at bedtime.   [DISCONTINUED] Potassium Chloride 40 MEQ/15ML (20%) SOLN Take 40 mEq by mouth 2 (two) times daily.   No facility-administered encounter medications on file as of 01/14/2023.     Assessment/Plan:   Unable to review medications today; will plan to complete medication review during our next telephone call.   Counsel patient on importance of adherence to heart failure medication regimen.  - Follow up with [facility] today on behalf of pharmacy. Speak with Morrie Sheldon who confirms pharmacy able to bill her for her copayment for Entresto and digoxin so that she can pick up these medications from pharmacy today. Also confirms pharmacy able to fill patient's Ozempic and Freestyle Libre 3 sensor for patient today   Diabetes: - Currently uncontrolled - Reviewed long term cardiovascular and renal outcomes of uncontrolled blood sugar - Counsel patient on signs and symptoms of hypoglycemia and encourage patient to carry glucose tablets with her to use if needed in case of hypoglycemia - Reviewed dietary modifications including importance of having regular well-balanced meals throughout the day, while controlling carbohydrate portion sizes             Encourage patient to increase consumption of non-starchy vegetables  Encourage to review nutrition labels for carbohydrate content of foods  Discuss balanced snack ideas - Collaborated with patient's PCP on 9/11 to recommend change from [facility] (non-preferred through patient's Medicaid plan) to [facility] for patient (Medicaid preferred alternative  Provider agreed and sent prescription to pharmacy on 9/12 Complete prior authorization for Ozempic for  patient today. Approved through 01/14/2024 - Counseled on Ozempic, including mechanism of action, side effects, and benefits. No personal or family history of medullary thyroid cancer, personal history of pancreatitis or gallbladder disease. Counseled on potential side effects of nausea, stomach upset, queasiness, constipation, and that these generally improve over time. Advised to contact our office with more severe symptoms, including nausea, diarrhea, stomach pain. Patient verbalized understanding. - Sent prescription for Freestyle Libre 3 sensors to pharmacy for patient as requested per protocol Complete prior authorization for [employer] sensors for patient today. Approved through 07/14/2023 - Provide patient with counseling on Freestyle Libre 3 CGM.  Patient plans to download and use Freestyle Libre 3 app on smart phone to use phone in place of reader device Advise patient to review Freestyle Libre 3 setup videos from manufacturer website and reach out to clinical pharmacist, pharmacy or office with any questions prior to starting use of this device Provide patient with phone number for [employer] Support at [phone]. Their customer support is available 7 days a week 8 am to 8 pm. - Recommend to start using Fre[employer] Libre 3 CGM to monitor blood sugar/as feedback on dietary choices Recommend to check glucose with  fingerstick check when needed for symptoms and as back up to CGM. Patient to contact office if needed for readings outside of established parameters or symptoms     Follow Up Plan: Clinical Pharmacist will follow up with patient by telephone on 01/31/2023 at 12:00 PM   Estelle Grumbles, PharmD, [facility] [phone]

## 2023-01-16 ENCOUNTER — Other Ambulatory Visit: Payer: Self-pay

## 2023-01-16 ENCOUNTER — Emergency Department
Admission: EM | Admit: 2023-01-16 | Discharge: 2023-01-16 | Disposition: A | Discharge status: Home / Self Care | Payer: Medicaid Other | Attending: Emergency Medicine | Admitting: Emergency Medicine

## 2023-01-16 ENCOUNTER — Emergency Department: Payer: Medicaid Other

## 2023-01-16 DIAGNOSIS — E119 Type 2 diabetes mellitus without complications: Secondary | ICD-10-CM | POA: Diagnosis not present

## 2023-01-16 DIAGNOSIS — M25561 Pain in right knee: Secondary | ICD-10-CM | POA: Insufficient documentation

## 2023-01-16 MED ORDER — OXYCODONE-ACETAMINOPHEN 5-325 MG PO TABS
1.0000 | ORAL_TABLET | Freq: Four times a day (QID) | ORAL | 0 refills | Status: AC | PRN
Start: 2023-01-16 — End: 2023-01-19
  Filled 2023-01-16: qty 8, 2d supply, fill #0

## 2023-01-16 MED ORDER — PREDNISONE 10 MG PO TABS
30.0000 mg | ORAL_TABLET | Freq: Every day | ORAL | 0 refills | Status: AC
  Filled 2023-01-16: qty 15, 5d supply, fill #0

## 2023-01-16 MED ORDER — PREDNISONE 20 MG PO TABS
40.0000 mg | ORAL_TABLET | Freq: Once | ORAL | Status: AC
  Administered 2023-01-16: 40 mg via ORAL
  Filled 2023-01-16: qty 2

## 2023-01-16 MED ORDER — OXYCODONE-ACETAMINOPHEN 5-325 MG PO TABS
1.0000 | ORAL_TABLET | Freq: Once | ORAL | Status: AC
  Administered 2023-01-16: 1 via ORAL
  Filled 2023-01-16: qty 1

## 2023-01-16 NOTE — ED Provider Notes (Signed)
[facility] Provider Note    Event Date/Time   First MD Initiated Contact with Patient 01/16/23 1958     (approximate)   History   Knee Pain   HPI  Molly Howard is a 37 y.o. female past medical history significant for diabetes, history of gout, who presents to the emergency department with right knee pain.  States that she has had progressively worsening right knee pain over the past couple of days.  Denies any trauma.  States that she has a history of kidney disease so she has been limited on what she can take.  States that it hurts significantly to bear weight.  Denies any swelling to her leg.  Denies any history of DVT or PE.  No chest pain or shortness of breath.  No fever or chills.     Physical Exam   Triage Vital Signs: ED Triage Vitals [01/16/23 1830]  Encounter Vitals Group     BP (!) 160/105     Systolic BP Percentile      Diastolic BP Percentile      Pulse Rate 81     Resp 18     Temp 98.3 F (36.8 C)     Temp Source Oral     SpO2 100 %     Weight 214 lb (97.1 kg)     Height 5\' 7"  (1.702 m)     Head Circumference      Peak Flow      Pain Score 10     Pain Loc      Pain Education      Exclude from Growth Chart     Most recent vital signs: Vitals:   01/16/23 1830 01/16/23 2117  BP: (!) 160/105 (!) 154/97  Pulse: 81 77  Resp: 18 17  Temp: 98.3 F (36.8 C) 98.1 F (36.7 C)  SpO2: 100% 98%    Physical Exam Constitutional:      Appearance: She is well-developed.  HENT:     Head: Atraumatic.  Eyes:     Conjunctiva/sclera: Conjunctivae normal.  Cardiovascular:     Rate and Rhythm: Regular rhythm.  Pulmonary:     Effort: No respiratory distress.  Abdominal:     General: There is no distension.  Musculoskeletal:        General: Normal range of motion.     Cervical back: Normal range of motion.     Comments: Mild tenderness palpation to the right knee.  Full flexion and extension.  No swelling to the right leg.  +2 DP  pulses.  Skin:    General: Skin is warm.  Neurological:     Mental Status: She is alert. Mental status is at baseline.      IMPRESSION / MDM / ASSESSMENT AND PLAN / ED COURSE  I reviewed the triage vital signs and the nursing notes.  Differential diagnosis including gout, pseudogout, arthritis, fracture, dislocation, knee strain   RADIOLOGY I independently reviewed imaging, my interpretation of imaging: X-ray with no acute fracture or dislocation.  No joint effusion.   Labs (all labs ordered are listed, but only abnormal results are displayed) Labs interpreted as -    Labs Reviewed - No data to display  Likely having a gout flare given her history of gout.  No concern for septic joint.  No findings concerning for cellulitis.  No significant swelling to the leg and have a low suspicion for DVT.  No acute fracture or dislocation and no  large joint effusion on x-ray for arthrocentesis.  Given her limitations of NSAIDs we will start the patient on a Percocet.  Given steroids.  Discussed side effects of prednisone and diabetes.  Given return precautions and discussed follow-up with orthopedics.     PROCEDURES:  Critical Care performed: No  Procedures  Patient's presentation is most consistent with acute presentation with potential threat to life or bodily function.   MEDICATIONS ORDERED IN ED: Medications  oxyCODONE-acetaminophen (PERCOCET/ROXICET) 5-325 MG per tablet 1 tablet (1 tablet Oral Given 01/16/23 2052)  predniSONE (DELTASONE) tablet 40 mg (40 mg Oral Given 01/16/23 2113)    FINAL CLINICAL IMPRESSION(S) / ED DIAGNOSES   Final diagnoses:  Acute pain of right knee     Rx / DC Orders   ED Discharge Orders          Ordered    oxyCODONE-acetaminophen (PERCOCET) 5-325 MG tablet  Every 6 hours PRN        01/16/23 2108    predniSONE (DELTASONE) 10 MG tablet  Daily        01/16/23 2108             Note:  This document was prepared using Dragon voice  recognition software and may include unintentional dictation errors.   Corena Herter, MD 01/16/23 2237

## 2023-01-16 NOTE — ED Triage Notes (Signed)
Pt sts that she has been having right knee pain. Pt sts that her pcp believes that she could have fluid on the knee. Pt is ambulatory in triage.

## 2023-01-16 NOTE — Discharge Instructions (Addendum)
You were seen in the emergency department for knee pain.  Concerned that it is gout since you have had gout in the past.  You do not have any signs of a broken bone.  There was no large joint effusion on your x-ray.  It is importantly follow-up closely with your primary care physician.  You are given information to follow-up with orthopedics.   You were given a prescription for narcotic pain medications.  Take only if in severe pain.  These are very addictive medications.  These medications can make you constipated.  If you need to take more than 1-2 doses, start a stool softner.  If you become constipated, take 1 capfull of MiraLAX, can repeat untill having regular bowel movements.  Keep this medication out of reach of any children.  Prednisone -you are given a prescription for a steroid.  It is important that you take this medication with food.  This medication can cause an upset stomach.  It also can increase your glucose if you have a history of diabetes, so it is important that you check your glucose frequently while you are on this medication.

## 2023-01-16 NOTE — ED Notes (Signed)
Discharge instructions reviewed with patient. Patient questions answered and opportunity for education reviewed. Patient voices understanding of discharge instructions with no further questions. Patient ambulatory with steady gait to lobby.  

## 2023-01-17 ENCOUNTER — Other Ambulatory Visit: Payer: Self-pay

## 2023-01-17 NOTE — Telephone Encounter (Signed)
Copied from CRM [id number]. Topic: General - Inquiry >> Jan 17, 2023 10:32 AM Marlow Baars wrote: Reason for CRM: The patient called in stating her knees were bothering her so bad from the gout she had to go to [facility] ED to be seen on Sunday 9/15. She got 2 prescriptions, one for pain and the other a steroid. She just wanted her provider to know. She also has a follow up next month with her provider. Please assist further

## 2023-01-19 ENCOUNTER — Other Ambulatory Visit: Payer: Medicaid Other

## 2023-01-19 NOTE — Patient Outreach (Signed)
Medicaid Managed Care Social Work Note  01/19/2023 Name:  Molly Howard MRN:  [id number] DOB:  24-Oct-1985  Molly Howard is an 37 y.o. year old female who is a primary patient of Molly Mail, DO.  The [facility] Managed Care Coordination team was consulted for assistance with:  Food Insecurity  Molly Howard was given information about Medicaid Managed Care Coordination team services today. Molly Howard Patient agreed to services and verbal consent obtained.  Engaged with patient  for by telephone forfollow up visit in response to referral for case management and/or care coordination services.   Assessments/Interventions:  Review of past medical history, allergies, medications, health status, including review of consultants reports, laboratory and other test data, was performed as part of comprehensive evaluation and provision of chronic care management services.  SDOH: (Social Determinant of Health) assessments and interventions performed: SDOH Interventions    Flowsheet Row Patient Outreach Telephone from 11/24/2022 in [place] POPULATION HEALTH DEPARTMENT Telephone from 06/30/2020 in [facility] Telephone from 06/16/2020 in [facility]  SDOH Interventions     Food Insecurity Interventions Other (Comment)  [working with Molly Howard for food assistance] -- Other (Comment)  [will help with transportation to get to food pantries as that was the main barrier]  Housing Interventions Intervention Not Indicated -- Other (Comment)  [referred to [employer] to apply for low income water assistance program]  Transportation Interventions Payor Benefit  [Provided with UHC transportation [phone] [employer]  Utilities Interventions Intervention Not Indicated -- --  Financial Strain Interventions -- -- Other (Comment)  [providing [employer] gift cards to help with medication costs]     Molly Howard completed a  telephone outreach with patient, she states she did receive the resources Molly Howard sent to her. Patient stated she did not receive the food bag Molly Howard submitted referral for, after researching they only service [employer].  Advanced Directives Status:  Not addressed in this encounter.  Care Plan                 Allergies  Allergen Reactions   No [employer] Food Allergies Rash and Other (See Comments)    Lemons    Medications Reviewed Today   Medications were not reviewed in this encounter     Patient Active Problem List   Diagnosis Date Noted   Cardiogenic shock (HCC) 11/14/2022   Bilateral leg edema 11/06/2022   NICM (nonischemic cardiomyopathy) (HCC) 07/31/2022   Hypocalcemia 07/25/2022   Leg edema 07/25/2022   Acute on chronic combined systolic and diastolic CHF (congestive heart failure) (HCC) 07/25/2022   GERD without esophagitis 07/24/2022   Cocaine abuse (HCC) 02/12/2022   Acute pulmonary edema (HCC)    Syncope 02/09/2022   Menorrhagia with regular cycle 06/22/2021   Dyslipidemia 11/27/2020   Chronic kidney disease (CKD), stage II (mild) 11/27/2020   Hypomagnesemia 10/17/2020   Dyspnea 05/21/2020   Essential hypertension 05/20/2020   Diabetes mellitus, type II (HCC) 05/20/2020   Chronic anemia 05/20/2020   Reactive thrombocytosis 05/20/2020   Obesity (BMI 30-39.9) 05/20/2020   Chronic combined systolic and diastolic heart failure (HCC) 11/01/2019   Financial difficulties 11/01/2019   Medication management 11/01/2019   Lung nodule 11/01/2019   Nonischemic cardiomyopathy (HCC) 11/01/2019   Polysubstance abuse (HCC) 11/01/2019   Tobacco use 11/01/2019   Swelling     Conditions to be addressed/monitored per PCP order:   food pantries  There are no care plans that you recently  modified to display for this patient.   Follow up:  Patient agrees to Care Plan and Follow-up.  Plan: The  Patient has been provided with contact information for the Managed Medicaid care  management team and has been advised to call with any health related questions or concerns.  Molly Howard, MHA [facility]  Managed [facility] Social Worker [phone]

## 2023-01-19 NOTE — Patient Instructions (Signed)
Visit Information  Molly Howard was given information about Medicaid Managed Care team care coordination services as a part of their [facility] Community [facility] Medicaid benefit. Molly Howard verbally consented to engagement with the [facility] Managed Care team.   If you are experiencing a medical emergency, please call 911 or report to your local emergency department or urgent care.   If you have a non-emergency medical problem during routine business hours, please contact your provider's office and ask to speak with a nurse.   For questions related to your [facility], please call: [phone] or visit the homepage here: [email or web address]  If you would like to schedule transportation through your [facility], please call the following number at least 2 days in advance of your appointment: [phone]   Rides for urgent appointments can also be made after hours by calling Member Services.  Call the Behavioral Health Crisis Line at [phone], at any time, 24 hours a day, 7 days a week. If you are in danger or need immediate medical attention call 911.  If you would like help to quit smoking, call 1-800-QUIT-NOW (364-511-7024) OR Espaol: 1-855-Djelo-Ya (1-324-401-0272) o para ms informacin haga clic aqu or Text READY to 536-644 to register via text  Molly Howard - following are the goals we discussed in your visit today:   Goals Addressed   None      The  Patient                                              has been provided with contact information for the Managed Medicaid care management team and has been advised to call with any health related questions or concerns.   Gus Puma, Kenard Gower, MHA [facility]  Managed Medicaid Social Worker [phone]   Following is a copy of your plan of care:  There are no care plans that you recently modified to display for this  patient.

## 2023-01-31 ENCOUNTER — Other Ambulatory Visit: Payer: Medicaid Other | Admitting: Pharmacist

## 2023-01-31 ENCOUNTER — Telehealth: Payer: Self-pay | Admitting: Pharmacist

## 2023-01-31 NOTE — Progress Notes (Signed)
Outreach Note  01/31/2023 Name: Molly Howard MRN: [id number] DOB: [birth year]  Referred by: Margarita Mail, DO Reason for referral : No chief complaint on file.   Was unable to reach patient via telephone today and unable to leave a message as no voicemail picks up   Follow Up Plan: Will attempt to reach patient by telephone again within the next 2 weeks  Estelle Grumbles, PharmD, [facility] [phone]

## 2023-02-01 ENCOUNTER — Other Ambulatory Visit: Payer: Self-pay | Admitting: Emergency Medicine

## 2023-02-01 DIAGNOSIS — I5043 Acute on chronic combined systolic (congestive) and diastolic (congestive) heart failure: Secondary | ICD-10-CM

## 2023-02-01 DIAGNOSIS — I4729 Other ventricular tachycardia: Secondary | ICD-10-CM

## 2023-02-01 DIAGNOSIS — I428 Other cardiomyopathies: Secondary | ICD-10-CM

## 2023-02-01 NOTE — Progress Notes (Signed)
Third attempt at contact -- As per the DPR, detailed message left on pt's answering service discussing the following:  "Sondra Barges, PA-C 01/21/2023  8:28 AM  Please inform the patient her heart monitor showed a predominant rhythm of sinus with an average rate of 76 bpm (range 62 to 187 bpm), 24 episodes of NSVT, (fast heartbeats coming from the bottom portion of the heart) lasting up to 13 beats, 1 episode of SVT (fast heartbeats coming from the top portion of the heart, lasting 6 beats), and rare extra beats coming from the top and bottom portions of the heart.  Recommendations: -With severe cardiomyopathy, I am hesitant to demonstrate further -Advanced heart failure has been follow-up with him, I do not see that she has an appointment with -Please schedule patient to be evaluated by advanced heart failure -Please refer to EP as well"  Pt reminded that two referrals would be placed for pt.  Pt given our phone number and encouraged to call if any concerns or questions arise.  Referrals placed for EP and CHF clinic

## 2023-02-04 ENCOUNTER — Other Ambulatory Visit: Payer: Self-pay

## 2023-02-04 ENCOUNTER — Encounter: Payer: Self-pay | Admitting: Physician Assistant

## 2023-02-04 ENCOUNTER — Ambulatory Visit: Payer: Medicaid Other | Attending: Physician Assistant | Admitting: Physician Assistant

## 2023-02-04 VITALS — BP 126/92 | HR 80 | Ht 67.0 in | Wt 202.8 lb

## 2023-02-04 DIAGNOSIS — F1910 Other psychoactive substance abuse, uncomplicated: Secondary | ICD-10-CM

## 2023-02-04 DIAGNOSIS — I10 Essential (primary) hypertension: Secondary | ICD-10-CM | POA: Diagnosis not present

## 2023-02-04 DIAGNOSIS — I5042 Chronic combined systolic (congestive) and diastolic (congestive) heart failure: Secondary | ICD-10-CM

## 2023-02-04 DIAGNOSIS — I428 Other cardiomyopathies: Secondary | ICD-10-CM | POA: Diagnosis not present

## 2023-02-04 DIAGNOSIS — N1831 Chronic kidney disease, stage 3a: Secondary | ICD-10-CM

## 2023-02-04 DIAGNOSIS — I071 Rheumatic tricuspid insufficiency: Secondary | ICD-10-CM | POA: Diagnosis not present

## 2023-02-04 DIAGNOSIS — I5082 Biventricular heart failure: Secondary | ICD-10-CM

## 2023-02-04 DIAGNOSIS — D509 Iron deficiency anemia, unspecified: Secondary | ICD-10-CM

## 2023-02-04 DIAGNOSIS — I2510 Atherosclerotic heart disease of native coronary artery without angina pectoris: Secondary | ICD-10-CM

## 2023-02-04 MED ORDER — TORSEMIDE 20 MG PO TABS
40.0000 mg | ORAL_TABLET | Freq: Two times a day (BID) | ORAL | 3 refills | Status: DC
  Filled 2023-02-04: qty 120, 30d supply, fill #0

## 2023-02-04 MED ORDER — ATORVASTATIN CALCIUM 20 MG PO TABS
20.0000 mg | ORAL_TABLET | Freq: Every day | ORAL | 3 refills | Status: DC
  Filled 2023-02-04: qty 90, 90d supply, fill #0

## 2023-02-04 MED ORDER — CARVEDILOL 6.25 MG PO TABS
6.2500 mg | ORAL_TABLET | Freq: Two times a day (BID) | ORAL | 3 refills | Status: DC
  Filled 2023-02-04 (×2): qty 180, 90d supply, fill #0

## 2023-02-04 NOTE — Patient Instructions (Signed)
Medication Instructions:  Your physician recommends the following medication changes.  INCREASE: Coreg 6.25 mg twice daily    *If you need a refill on your cardiac medications before your next appointment, please call your pharmacy*   Lab Work: Your provider would like for you to have following labs drawn today BMET and Digoxin Level.   If you have labs (blood work) drawn today and your tests are completely normal, you will receive your results only by: MyChart Message (if you have MyChart) OR A paper copy in the mail If you have any lab test that is abnormal or we need to change your treatment, we will call you to review the results.   Follow-Up: At [facility], you and your health needs are our priority.  As part of our continuing mission to provide you with exceptional heart care, we have created designated Provider Care Teams.  These Care Teams include your primary Cardiologist (physician) and Advanced Practice Providers (APPs -  Physician Assistants and Nurse Practitioners) who all work together to provide you with the care you need, when you need it.  We recommend signing up for the patient portal called "MyChart".  Sign up information is provided on this After Visit Summary.  MyChart is used to connect with patients for Virtual Visits (Telemedicine).  Patients are able to view lab/test results, encounter notes, upcoming appointments, etc.  Non-urgent messages can be sent to your provider as well.   To learn more about what you can do with MyChart, go to [email or web address].    Your next appointment:   1 month(s)  Provider:   You may see Debbe Odea, MD or one of the following Advanced Practice Providers on your designated Care Team:   Eula Listen, [place]

## 2023-02-04 NOTE — Progress Notes (Signed)
Cardiology Office Note    Date:  02/04/2023   ID:  Molly Howard, DOB 22-Feb-1986, MRN [id number]  PCP:  Margarita Mail, DO  Cardiologist:  Debbe Odea, MD  Electrophysiologist:  Lanier Prude, MD   Chief Complaint: Follow up  History of Present Illness:   Molly Howard is a 37 y.o. 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    Her cardiomyopathy dates back to 2021 with echo at that time showing an EF of 35 to 40%.  Cardiac MRI in 05/2020 showed an EF of 42%, mild LV dilatation, RVEF 50%, and no delayed enhancement.  Echo from 07/2022 showed an EF of 25 to 30%, mildly decreased RV systolic function, PASP 50 mmHg, moderate to severe tricuspid regurgitation, and mild mitral regurgitation.  RHC in 07/2022 showed elevated right greater than left heart filling pressures, pulmonary venous hypertension, and moderately reduced cardiac output.  She has required ongoing hospital admissions for volume overload requiring aggressive diuresis and augmentation with milrinone.  Following discharge in 07/2022, she never followed up with cardiology.  She was most recently admitted to the hospital in 11/2022 for volume overload after running out of all of her medications.  She continued to use cocaine regularly and was cocaine positive that admission.  She was followed by the advanced heart failure service during the admission with symptomatic improvement following aggressive diuresis, net -36 L for the admission.    She was seen in hospital follow-up on 12/29/2022 and reported adherence to cardiac medications.  She denies smoking tobacco or using cocaine since hospital discharge.  She was drinking 2-3 beers nightly.  When compared to her  visit in our office in 01/2022, her weight was down 54 pounds.  She reported intermittent palpitations.  Entresto was titrated to 49/51 mg twice daily with continuation of low, Farxiga, torsemide, and digoxin.  Zio patch showed a predominant rhythm of sinus with an average rate of 76 bpm with 24 episodes of NSVT lasting up to 13 beats and 1 episode of SVT lasting 6 beats.  She comes in doing well from a cardiac perspective and is without symptoms of angina or cardiac decompensation.  Her weight is down another 7 pounds today when compared to her visit in our office in 12/2022.  She reports that she has been out of torsemide for approximately 2 weeks and atorvastatin for 2 to 3 days.  She reports adherence to remaining cardiac medications with individual review.  She is adding salt to food and eats out at restaurants/takeout food including [employer] occasionally.  She continues to drink 2-3 beers nightly and is drinking greater than 2 L of liquid on top of this.  Stable two-pillow orthopnea.  No lower extremity swelling or abdominal distention.  She reports that she has not smoked tobacco or used cocaine since leading up to her hospital admission in 11/2022.  She does continue to note intermittent palpitations.  She is without near-syncope or syncope.   Labs independently reviewed: 01/2023 - magnesium 1.9, Hgb 10.9, PLT 262, potassium 3.9, BUN 34, serum creatinine 1.25 12/2022 - digoxin 0.4 11/2022 - Hgb 10.2, PLT 313, free T4 normal, albumin 1.8, AST/ALT normal, urine drug screen positive for cocaine, A1c 11.7 05/2022 - TC 216, TG 124, HDL 46,  LDL 145  Past Medical History:  Diagnosis Date   Acid reflux    Chronic HFrEF (heart failure with reduced ejection fraction) (HCC)    a. 10/2019 Echo: EF 35-40%, GrII DD; b. 05/2020 Echo: EF 20-25%, glob HK; c. 10/2020 Echo: EF 30-35%, glob HK. GrII DD, Mildly red RV fxn. Mod TR; d.  05/2021 cMRI: EF 42%, no LGE. Nl RV size/fxn.   CKD (chronic kidney disease), stage II     Diabetes mellitus (HCC)    H/O medication noncompliance    Hypertension    Microcytic anemia    NICM (nonischemic cardiomyopathy) (HCC)    a. 10/2019 Echo: EF 35-40%; b. 10/2019 MV: No ischemia. Small apical defect-->breast attenuation; c. 05/2020 Echo: EF 20-25%; d. 10/2020 Echo: EF 30-35%, glob HK. GrII DD, Mildly red RV fxn. Mod TR; e. 05/2021 cMRI: EF 42%, no LGE. Nl RV size/fxn.   Obesity    Polysubstance abuse [facility])     Past Surgical History:  Procedure Laterality Date   CHOLECYSTECTOMY     HERNIA REPAIR     RIGHT HEART CATH N/A 07/28/2022   Procedure: RIGHT HEART CATH;  Surgeon: Iran Ouch, MD;  Location: [facility] INVASIVE CV LAB;  Service: Cardiovascular;  Laterality: N/A;    Current Medications: Current Meds  Medication Sig   Accu-Chek Softclix Lancets lancets Use as instructed   Continuous Glucose Sensor (FREESTYLE LIBRE 3 SENSOR) MISC Place 1 sensor on the skin every 14 days. Use to check glucose continuously   dapagliflozin propanediol (FARXIGA) 10 MG TABS tablet Take 1 tablet (10 mg total) by mouth once daily.   digoxin (LANOXIN) 0.125 MG tablet Take 0.5 tablets (0.0625 mg total) by mouth daily.   ferrous sulfate 325 (65 FE) MG tablet Take 1 tablet (325 mg total) by mouth daily with breakfast.   insulin glargine (LANTUS) 100 UNIT/ML Solostar Pen Inject 15 Units into the skin 2 (two) times daily. (Patient taking differently: Inject 25 Units into the skin 2 (two) times daily.)   nitroGLYCERIN (NITROSTAT) 0.4 MG SL tablet Place 1 tablet (0.4 mg total) under the tongue every 5 (five) minutes as needed for chest pain.   sacubitril-valsartan (ENTRESTO) 49-51 MG Take 1 tablet by mouth 2 (two) times daily.   Semaglutide,0.25 or 0.5MG /DOS, (OZEMPIC, 0.25 OR 0.5 MG/DOSE,) 2 MG/3ML SOPN Inject 0.25 mg into the skin once a week.   [DISCONTINUED] carvedilol (COREG) 3.125 MG tablet Take 1 tablet (3.125 mg total) by mouth 2 (two) times daily with a meal.    Allergies:   No healthtouch  food allergies   Social History   Socioeconomic History   Marital status: Single    Spouse name: Not on file   Number of children: Not on file   Years of education: Not on file   Highest education level: Not on file  Occupational History   Not on file  Tobacco Use   Smoking status: Former    Current packs/day: 0.00    Types: Cigarettes    Quit date: 2022    Years since quitting: 2.7   Smokeless tobacco: Never  Vaping Use   Vaping status: Never Used  Substance and Sexual Activity   Alcohol use: Not Currently    Comment: ~ 4 shots/day on weekends only   Drug use: Not Currently    Types: Cocaine    Comment: Admits to using cocaine up and will February 2024.   Sexual activity: Not Currently    Birth control/protection: None  Other Topics  Concern   Not on file  Social History Narrative   Lives locally.  Does not routinely exercise.  Has been using cocaine.   Social Determinants of Health   Financial Resource Strain: High Risk (06/16/2020)   Overall Financial Resource Strain (CARDIA)    Difficulty of Paying Living Expenses: Hard  Food Insecurity: Food Insecurity Present (11/24/2022)   Hunger Vital Sign    Worried About Running Out of Food in the Last Year: Sometimes true    Ran Out of Food in the Last Year: Sometimes true  Transportation Needs: Unmet Transportation Needs (11/24/2022)   PRAPARE - Administrator, Civil Service (Medical): Yes    Lack of Transportation (Non-Medical): Yes  Physical Activity: Not on file  Stress: Not on file  Social Connections: Not on file     Family History:  The patient's family history includes Diabetes in her father and mother; Heart failure in her mother; Hypertension in her father.  ROS:   12-point review of systems is negative unless otherwise noted in the HPI.   EKGs/Labs/Other Studies Reviewed:    Studies reviewed were summarized above. The additional studies were reviewed today:  Zio patch 12/2022: Patient had a  min HR of 62 bpm, max HR of 187 bpm, and avg HR of 76 bpm. Predominant underlying rhythm was Sinus Rhythm. 24 Ventricular Tachycardia runs occurred, the run with the fastest interval lasting 7 beats with a max rate of 187 bpm, the longest  lasting 13 beats with an avg rate of 128 bpm. 1 run of Supraventricular Tachycardia occurred lasting 6 beats with a max rate of 135 bpm (avg 116 bpm). Isolated SVEs were rare (<1.0%), SVE Couplets were rare (<1.0%), and SVE Triplets were rare (<1.0%).  Isolated VEs were rare (<1.0%, 1371), VE Couplets were rare (<1.0%, 120), and VE Triplets were rare (<1.0%, 26). Ventricular Bigeminy was present.    Conclusion Occasional nonsustained VT. 1 episode of paroxysmal SVT. No sustained arrhythmias. No atrial fibrillation or atrial flutter. __________  RHC 07/28/2022: Successful right heart catheterization via the right antecubital vein. Severely elevated right and left-sided filling pressures, moderate pulmonary hypertension and moderately reduced cardiac output.   RA: 30 mmHg RV: 56/14 mmHg PW: 34 mmHg PA: 54/30 with a mean of 41 mmHg PA sat is 42% Cardiac output: 4.47 with a cardiac index of 2. CPO is 0.89 PAPI: 0.8   Recommendations: The patient is severely volume overloaded with evidence of biventricular failure right more than left by hemodynamics. Recommend starting milrinone drip and furosemide drip.  Will transfer to stepdown unit. __________   2D echo 07/25/2022: 1. Left ventricular ejection fraction, by estimation, is 25 to 30%. Left  ventricular ejection fraction by 2D MOD biplane is 21.6 %. The left  ventricle has severely decreased function. The left ventricle demonstrates  global hypokinesis. Left ventricular  diastolic parameters are indeterminate.   2. Right ventricular systolic function is mildly reduced. The right  ventricular size is normal. There is moderately elevated pulmonary artery  systolic pressure. The estimated right  ventricular systolic pressure is  50.7 mmHg.   3. Left atrial size was mildly dilated.   4. A small pericardial effusion is present. The pericardial effusion is  circumferential. There is no evidence of cardiac tamponade.   5. The aortic valve is tricuspid. Aortic valve regurgitation is not  visualized. No aortic stenosis is present.   6. The mitral valve is normal in structure. Mild mitral valve  regurgitation. No evidence of  mitral stenosis.   7. Tricuspid valve regurgitation is moderate to severe.  __________   Limited echo 09/18/2021: 1. Left ventricular ejection fraction, by estimation, is 25 to 30%. Left  ventricular ejection fraction by 3D volume is 28 %. The left ventricle has  severely decreased function. The left ventricle demonstrates global  hypokinesis. Left ventricular  diastolic parameters are consistent with Grade II diastolic dysfunction  (pseudonormalization). The average left ventricular global longitudinal  strain is -5.9 %. The global longitudinal strain is abnormal.   2. Right ventricular systolic function is mildly reduced. The right  ventricular size is mildly enlarged. Tricuspid regurgitation signal is  inadequate for assessing PA pressure.   3. The mitral valve is normal in structure. No evidence of mitral valve  regurgitation. No evidence of mitral stenosis.   4. The aortic valve is tricuspid. Aortic valve regurgitation is not  visualized. No aortic stenosis is present.   5. The inferior vena cava is normal in size with greater than 50%  respiratory variability, suggesting right atrial pressure of 3 mmHg.  __________   2D echo 06/30/2021: 1. Left ventricular ejection fraction, by estimation, is 30 to 35%. The  left ventricle has moderately decreased function. The left ventricle  demonstrates global hypokinesis. The left ventricular internal cavity size  was mildly dilated. Left ventricular  diastolic parameters are consistent with Grade II diastolic  dysfunction  (pseudonormalization).   2. Right ventricular systolic function is normal. The right ventricular  size is normal. Tricuspid regurgitation signal is inadequate for assessing  PA pressure.   3. The mitral valve is normal in structure. Mild mitral valve  regurgitation. No evidence of mitral stenosis.   4. The aortic valve is tricuspid. Aortic valve regurgitation is not  visualized. No aortic stenosis is present.   5. The inferior vena cava is normal in size with greater than 50%  respiratory variability, suggesting right atrial pressure of 3 mmHg.  __________   Cardiac MRI 05/19/2021: IMPRESSION: 1. Mild LV dilatation, mild hypertrophy, and mild systolic dysfunction (EF 42%)  2.  No late gadolinium enhancement to suggest myocardial scar 3.  Normal RV size and systolic function (EF 50%) ___________   2D echo 03/20/2021: 1. Left ventricular ejection fraction, by estimation, is 25 to 30%. The  left ventricle has severely decreased function. The left ventricle  demonstrates global hypokinesis. Left ventricular diastolic parameters are  consistent with Grade II diastolic  dysfunction (pseudonormalization). The average left ventricular global  longitudinal strain is -9.2 %. The global longitudinal strain is abnormal.   2. Right ventricular systolic function is mildly reduced. The right  ventricular size is mildly enlarged. There is severely elevated pulmonary  artery systolic pressure. The estimated right ventricular systolic  pressure is 70.8 mmHg.   3. Left atrial size was mildly dilated.   4. The mitral valve is normal in structure. Mild mitral valve  regurgitation. No evidence of mitral stenosis.   5. Tricuspid valve regurgitation is mild to moderate.   6. The inferior vena cava is dilated in size with <50% respiratory  variability, suggesting right atrial pressure of 15 mmHg.  __________   2D echo 10/11/2020: 1. Left ventricular ejection fraction, by estimation, is 30  to 35%. The  left ventricle has moderate to severely decreased function. The left  ventricle demonstrates global hypokinesis. Left ventricular diastolic  parameters are consistent with Grade II  diastolic dysfunction (pseudonormalization).   2. Right ventricular systolic function is mildly reduced. The right  ventricular size is  normal.   3. A small pericardial effusion is present.   4. The mitral valve is normal in structure. No evidence of mitral valve  regurgitation.   5. Tricuspid valve regurgitation is moderate.   6. The aortic valve is tricuspid. Aortic valve regurgitation is not  visualized.  __________   2D echo 05/20/2020: 1. Left ventricular ejection fraction, by estimation, is 20 to 25%. The  left ventricle has severely decreased function. The left ventricle  demonstrates global hypokinesis. The left ventricular internal cavity size  was mildly dilated. Indeterminate  diastolic filling due to E-A fusion.   2. Right ventricular systolic function is normal. The right ventricular  size is mildly enlarged. There is severely elevated pulmonary artery  systolic pressure.   3. The mitral valve is normal in structure. Trivial mitral valve  regurgitation. No evidence of mitral stenosis.   4. Tricuspid valve regurgitation is moderate.   5. The aortic valve was not well visualized. Aortic valve regurgitation  is not visualized. No aortic stenosis is present.   6. The inferior vena cava is dilated in size with <50% respiratory  variability, suggesting right atrial pressure of 15 mmHg.  __________   Eugenie Birks MPI 10/05/2019: There was no ST segment deviation noted during stress. T wave inversion was noted during stress in the I and aVL leads. Defect 1: There is a small defect of mild severity present in the apex location. This is likely due to breast attenuation artifact Nuclear stress EF: 30%. This is a high risk study due to low EF. Low EF seems to be due to nonischemic  cardiomyopathy. __________   2D echo 10/03/2019: 1. Left ventricular ejection fraction, by estimation, is 35 to 40%. The  left ventricle has moderately decreased function. The left ventricle  demonstrates global hypokinesis. Left ventricular diastolic parameters are  consistent with Grade II diastolic  dysfunction (pseudonormalization).   2. Right ventricular systolic function is normal. The right ventricular  size is normal.   3. The mitral valve is normal in structure. No evidence of mitral valve  regurgitation. No evidence of mitral stenosis.   4. The aortic valve is normal in structure. Aortic valve regurgitation is  not visualized. No aortic stenosis is present.   5. The inferior vena cava is normal in size with greater than 50%  respiratory variability, suggesting right atrial pressure of 3 mmHg.    EKG:  EKG is ordered today.  The EKG ordered today demonstrates NSR, 80 bpm, poor R wave progression along the precordial leads, nonspecific lateral st/t changes  Recent Labs: 11/05/2022: B Natriuretic Peptide 1,267.5 11/06/2022: ALT 11 01/10/2023: BUN 34; Creatinine, Ser 1.25; Hemoglobin 10.9; Magnesium 1.9; Platelets 262; Potassium 3.9; Sodium 138  Recent Lipid Panel    Component Value Date/Time   CHOL 216 (H) 05/27/2022 1022   TRIG 124 05/27/2022 1022   HDL 46 05/27/2022 1022   CHOLHDL 4.7 05/27/2022 1022   VLDL 25 05/27/2022 1022   LDLCALC 145 (H) 05/27/2022 1022   LDLCALC 171 (H) 06/12/2021 1354    PHYSICAL EXAM:    VS:  BP (!) 126/92 (BP Location: Left Arm, Patient Position: Sitting, Cuff Size: Normal)   Pulse 80   Ht 5\' 7"  (1.702 m)   Wt 202 lb 12.8 oz (92 kg)   LMP 12/28/2022 (Approximate)   SpO2 98%   BMI 31.76 kg/m   BMI: Body mass index is 31.76 kg/m.  Physical Exam Vitals reviewed.  Constitutional:      Appearance: She is  well-developed.  HENT:     Head: Normocephalic and atraumatic.  Eyes:     General:        Right eye: No discharge.        Left eye: No  discharge.  Neck:     Vascular: No JVD.  Cardiovascular:     Rate and Rhythm: Normal rate and regular rhythm.     Heart sounds: S1 normal and S2 normal. Heart sounds not distant. No midsystolic click and no opening snap. Murmur heard.     Systolic murmur is present with a grade of 1/6 at the upper left sternal border.     No friction rub.  Pulmonary:     Effort: Pulmonary effort is normal. No respiratory distress.     Breath sounds: Normal breath sounds. No decreased breath sounds, wheezing or rales.  Chest:     Chest wall: No tenderness.  Abdominal:     General: There is no distension.  Musculoskeletal:     Cervical back: Normal range of motion.     Right lower leg: No edema.     Left lower leg: No edema.  Skin:    General: Skin is warm and dry.     Nails: There is no clubbing.  Neurological:     Mental Status: She is alert and oriented to person, place, and time.  Psychiatric:        Speech: Speech normal.        Behavior: Behavior normal.        Thought Content: Thought content normal.        Judgment: Judgment normal.     Wt Readings from Last 3 Encounters:  02/04/23 202 lb 12.8 oz (92 kg)  01/16/23 214 lb (97.1 kg)  01/11/23 215 lb 1.6 oz (97.6 kg)     ASSESSMENT & PLAN:   Chronic combined systolic and diastolic CHF with BiV failure: Her cardiomyopathy has been felt to be nonischemic in etiology due to ongoing cocaine abuse and uncontrolled hypertension with intermittent medical compliance.  No delayed enhancement on cardiac MRI.  She has been followed by the advanced heart failure service and is not a candidate for advanced therapies due to persistently positive tox screens for cocaine and medical nonadherence.  Titrate carvedilol to 6.25 mg twice daily with continuation of Entresto 49/51 mg twice daily, Farxiga 10 mg, and digoxin 0.125 mg.  Restart torsemide 40 mg twice daily with KCl repletion.  Check BMP and digoxin level today and BMP 1 week after resuming torsemide.   She has not been maintained on spironolactone given concerns for medication adherence and follow-up.  Continue to follow-up with the heart failure clinic.  CHF education.  Moderate to severe tricuspid regurgitation: Not a candidate for invasive procedure given persistently positive urine toxicology screens.  Medical therapy as outlined above.  Nonobstructive CAD: No symptoms of angina or cardiac decompensation.  Restart atorvastatin as outlined above.  Palpitations/NSVT/PSVT: Titrate carvedilol to 6.25 mg twice daily.  Possibly exacerbated/driven by cocaine use.  HTN: Blood pressure is well-controlled in the office today.  Medical therapy as outlined above.  Cocaine, tobacco, and ongoing alcohol abuse: Reports abstaining from cocaine and tobacco use.  Continues to drink 2-3 beers nightly.  Complete cessation and continued abstinence of polysubstance abuse is encouraged.  Iron deficiency anemia: Follow-up with PCP as directed.  Uncontrolled DM2: A1c 11.7.  Ongoing management per PCP.  CKD stage IIIa: Stable on last check.  Check BMP.   Disposition: F/u with Dr.  Agbor-Etang or an APP in 1 month.   Medication Adjustments/Labs and Tests Ordered: Current medicines are reviewed at length with the patient today.  Concerns regarding medicines are outlined above. Medication changes, Labs and Tests ordered today are summarized above and listed in the Patient Instructions accessible in Encounters.   Signed, Eula Listen, PA-C 02/04/2023 2:51 PM     [facility] [place] [address] [place], [place] [address] [phone]

## 2023-02-05 LAB — BASIC METABOLIC PANEL
BUN/Creatinine Ratio: 14 (ref 9–23)
BUN: 16 mg/dL (ref 6–20)
CO2: 25 mmol/L (ref 20–29)
Calcium: 8.4 mg/dL — ABNORMAL LOW (ref 8.7–10.2)
Chloride: 102 mmol/L (ref 96–106)
Creatinine, Ser: 1.16 mg/dL — ABNORMAL HIGH (ref 0.57–1.00)
Glucose: 154 mg/dL — ABNORMAL HIGH (ref 70–99)
Potassium: 3.8 mmol/L (ref 3.5–5.2)
Sodium: 141 mmol/L (ref 134–144)
eGFR: 62 mL/min/{1.73_m2} (ref >59)

## 2023-02-05 LAB — DIGOXIN LEVEL: Digoxin, Serum: 0.6 ng/mL (ref 0.5–0.9)

## 2023-02-06 IMAGING — CR DG CHEST 2V
1 series · 2 of 2 positions shown · non-contrast
Comparison: Chest radiograph 10/10/2020 and earlier.

CLINICAL DATA: 35-year-old female with chest pain, nonproductive
cough, shortness of breath and lower extremity swelling.
Unintentional weight gain. History of CHF.

EXAM:
CHEST - 2 VIEW

[Series 1: dg chest 2 view · 0.14mm/px · 2 of 2 slices shown]
[im 1/2]
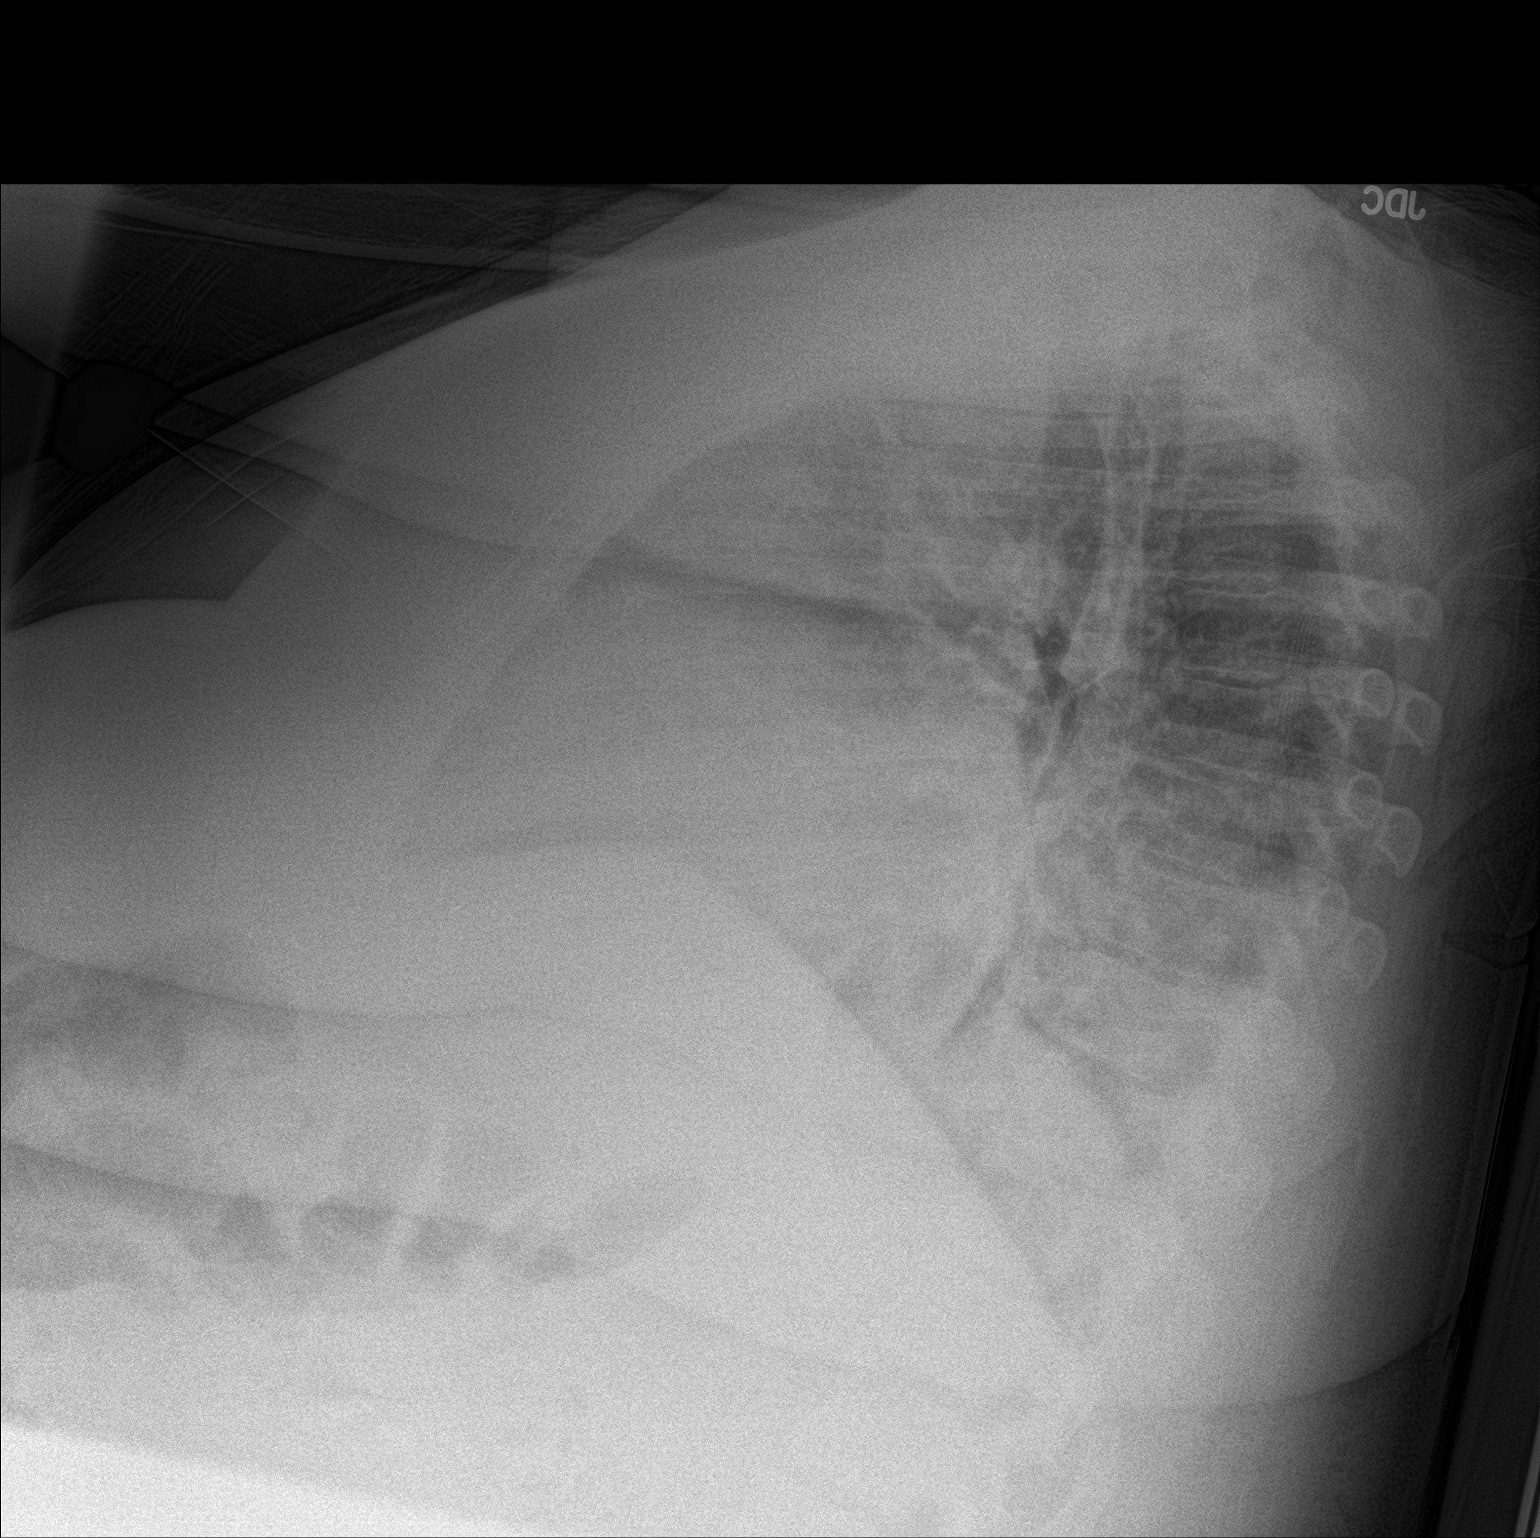
[im 2/2]
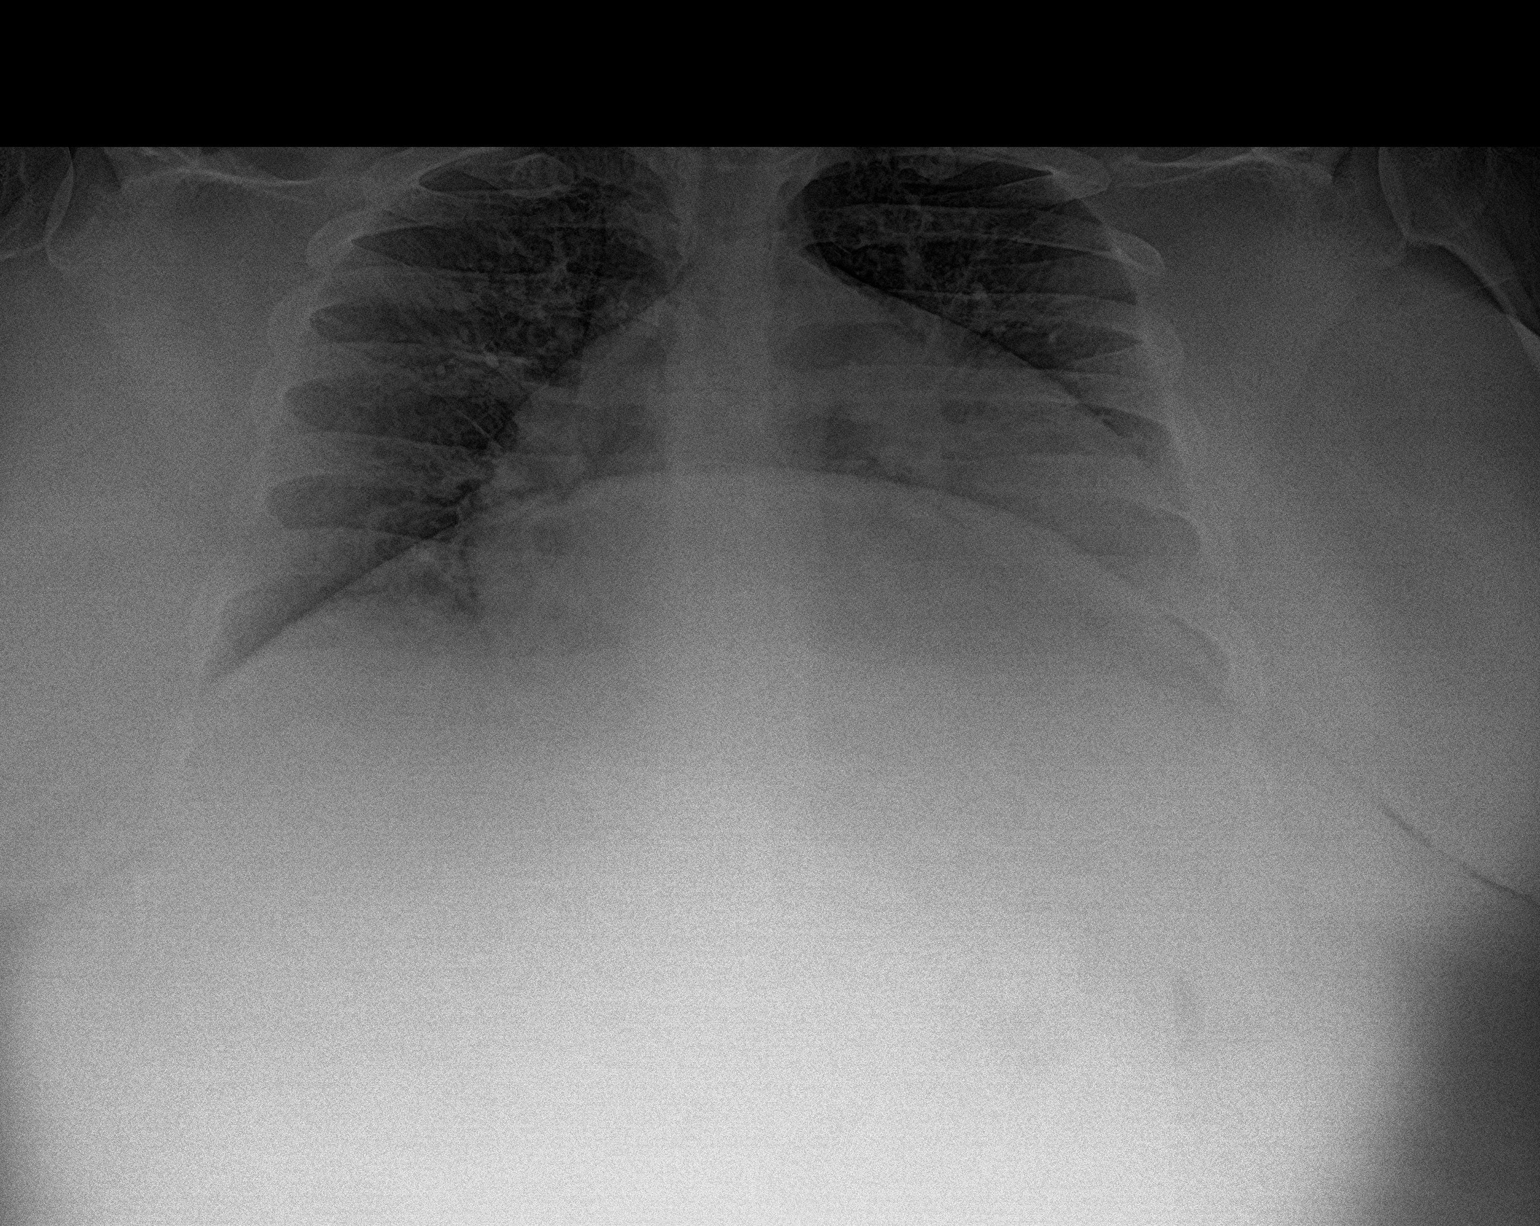

[2 of 2 positions shown; findings below may reference images not displayed]

FINDINGS: Seated AP and lateral views of the chest. Large body habitus.
Lordotic AP view. Stable cardiomegaly and mediastinal contours.
Other mediastinal contours are within normal limits. Visualized
tracheal air column is within normal limits. Pulmonary vascularity
appears stable since last month, no overt edema. No pneumothorax. No
pleural effusion is evident. No consolidation. No acute osseous
abnormality identified. Negative visible bowel gas pattern.
IMPRESSION: Stable cardiomegaly and pulmonary vascular congestion since last
month without overt edema.

## 2023-02-08 NOTE — Progress Notes (Unsigned)
PCP: Margarita Mail, DO (last seen 09/24) Primary Cardiologist: Debbe Odea, MD (last seen 10/24)  HPI:  Molly Howard is a 37 y/o female with a history of DM, HTN, previous drug use, NSVT, PSVT, mod/severe TR, CKD, anemia and chronic biventricular heart failure.   Admitted 02/09/22 due to acute on chronic HF. + cocaine. Discharged after 7 days. Admitted 07/24/22 due to decompensated HFrEF. Received IV Lasix. Was on milrinone drip. Digoxin added. Uop about 41 liters with decrease in weight 116 kg >>91 kg. Admitted 11/05/22 due to swelling and fluid retention in arms and legs along with some central chest tightness and shortness of breath and all has been going on for about a week. Needed lasix gtt with milrinone and diuresing well,  total UOP >40 liters. Weight significantly down 130 kg-->97 kg. Weaned off of IV drips. Spironolactone stopped due to hyperkalemia. Urine drug screen + for cocaine. Negative for dvt/stenosis. X-ray nothing acute to explain left arm swelling.    Echo 10/11/20: EF 30-35% along with moderate TR.  Echo 03/20/21: EF 25-30% along with severely elevated PA pressure of 70.8 mmHg, mild LAE, mild MR and mild/moderate TR Echo 06/30/21: EF  30-35% along with mild MR. Echo 09/18/21: EF  25-30%.   Echo 07/25/22: EF 25-30% with moderately elevated PA pressure of 50.7 mmHg, mild LAE, mild MR, small pericardial effusion and moderate/ severe TR  Cardiac MRI 05/19/21:  1. Mild LV dilatation, mild hypertrophy, and mild systolic dysfunction (EF 42%)  2.  No late gadolinium enhancement to suggest myocardial scar  3.  Normal RV size and systolic function (EF 50%)  RHC 07/28/22: Severely elevated right and left-sided filling pressures, moderate pulmonary hypertension and moderately reduced cardiac output. RA: 30 mmHg RV: 56/14 mmHg PW: 34 mmHg PA: 54/30 with a mean of 41 mmHg PA sat is 42% Cardiac output: 4.47 with a cardiac index of 2. CPO is 0.89 PAPI: 0.8  Zio patch  12/2022: Patient had a min HR of 62 bpm, max HR of 187 bpm, and avg HR of 76 bpm. Predominant underlying rhythm was Sinus Rhythm. 24 Ventricular Tachycardia runs occurred, the run with the fastest interval lasting 7 beats with a max rate of 187 bpm, the longest lasting 13 beats with an avg rate of 128 bpm. 1 run of Supraventricular Tachycardia occurred lasting 6 beats with a max rate of 135 bpm (avg 116 bpm). Isolated SVEs were rare (<1.0%), SVE Couplets were rare (<1.0%), and SVE Triplets were rare (<1.0%). Isolated VEs were rare (<1.0%, 1371), VE Couplets were rare (<1.0%, 120), and VE Triplets were rare (<1.0%, 26). Ventricular Bigeminy was present.  Conclusion Occasional nonsustained VT. 1 episode of paroxysmal SVT. No sustained arrhythmias. No atrial fibrillation or atrial flutter.  She presents today for a HF follow-up visit with a chief complaint of minimal SOB with exertion. Chronic in nature. Has associated fatigue, palpitations, occasional PND and dizziness at times. Denies chest pain, cough, abdominal distention, pedal edema or weight gain.   Says that she has been out of torsemide for ~ 3 weeks because she can't afford her copays.   Drinks ~ 60 oz soda and some water daily. Drinks 10 twelve ounce cans of beer ~ once/ week. No drug use since 07/24.  ROS: All systems negative except as listed in HPI, PMH and Problem List.  SH:  Social History   Socioeconomic History   Marital status: Single    Spouse name: Not on file   Number of children: Not on  file   Years of education: Not on file   Highest education level: Not on file  Occupational History   Not on file  Tobacco Use   Smoking status: Former    Current packs/day: 0.00    Types: Cigarettes    Quit date: 2022    Years since quitting: 2.7   Smokeless tobacco: Never  Vaping Use   Vaping status: Never Used  Substance and Sexual Activity   Alcohol use: Not Currently    Comment: ~ 4 shots/day on weekends only   Drug use:  Not Currently    Types: Cocaine    Comment: Admits to using cocaine up and will February 2024.   Sexual activity: Not Currently    Birth control/protection: None  Other Topics Concern   Not on file  Social History Narrative   Lives locally.  Does not routinely exercise.  Has been using cocaine.   Social Determinants of Health   Financial Resource Strain: High Risk (06/16/2020)   Overall Financial Resource Strain (CARDIA)    Difficulty of Paying Living Expenses: Hard  Food Insecurity: Food Insecurity Present (11/24/2022)   Hunger Vital Sign    Worried About Running Out of Food in the Last Year: Sometimes true    Ran Out of Food in the Last Year: Sometimes true  Transportation Needs: Unmet Transportation Needs (11/24/2022)   PRAPARE - Administrator, Civil Service (Medical): Yes    Lack of Transportation (Non-Medical): Yes  Physical Activity: Not on file  Stress: Not on file  Social Connections: Not on file  Intimate Partner Violence: Not At Risk (11/06/2022)   Humiliation, Afraid, Rape, and Kick questionnaire    Fear of Current or Ex-Partner: No    Emotionally Abused: No    Physically Abused: No    Sexually Abused: No    FH:  Family History  Problem Relation Age of Onset   Heart failure Mother        Onset of heart failure 40s.  Died in 05/15/2022.   Diabetes Mother    Hypertension Father    Diabetes Father     Past Medical History:  Diagnosis Date   Acid reflux    Chronic HFrEF (heart failure with reduced ejection fraction) (HCC)    a. 10/2019 Echo: EF 35-40%, GrII DD; b. 05/2020 Echo: EF 20-25%, glob HK; c. 10/2020 Echo: EF 30-35%, glob HK. GrII DD, Mildly red RV fxn. Mod TR; d.  05/2021 cMRI: EF 42%, no LGE. Nl RV size/fxn.   CKD (chronic kidney disease), stage II    Diabetes mellitus (HCC)    H/O medication noncompliance    Hypertension    Microcytic anemia    NICM (nonischemic cardiomyopathy) (HCC)    a. 10/2019 Echo: EF 35-40%; b. 10/2019 MV: No  ischemia. Small apical defect-->breast attenuation; c. 05/2020 Echo: EF 20-25%; d. 10/2020 Echo: EF 30-35%, glob HK. GrII DD, Mildly red RV fxn. Mod TR; e. 05/2021 cMRI: EF 42%, no LGE. Nl RV size/fxn.   Obesity    Polysubstance abuse (HCC)     Current Outpatient Medications  Medication Sig Dispense Refill   Accu-Chek Softclix Lancets lancets Use as instructed 100 each 12   atorvastatin (LIPITOR) 20 MG tablet Take 1 tablet (20 mg total) by mouth daily. 90 tablet 3   carvedilol (COREG) 6.25 MG tablet Take 1 tablet (6.25 mg total) by mouth 2 (two) times daily with a meal. 180 tablet 3   Continuous Glucose Sensor (FREESTYLE LIBRE  3 SENSOR) MISC Place 1 sensor on the skin every 14 days. Use to check glucose continuously 2 each 12   dapagliflozin propanediol (FARXIGA) 10 MG TABS tablet Take 1 tablet (10 mg total) by mouth once daily. 90 tablet 1   digoxin (LANOXIN) 0.125 MG tablet Take 0.5 tablets (0.0625 mg total) by mouth daily. 30 tablet 3   ferrous sulfate 325 (65 FE) MG tablet Take 1 tablet (325 mg total) by mouth daily with breakfast. 30 tablet 3   insulin glargine (LANTUS) 100 UNIT/ML Solostar Pen Inject 15 Units into the skin 2 (two) times daily. (Patient taking differently: Inject 25 Units into the skin 2 (two) times daily.) 15 mL 11   nitroGLYCERIN (NITROSTAT) 0.4 MG SL tablet Place 1 tablet (0.4 mg total) under the tongue every 5 (five) minutes as needed for chest pain. 25 tablet 12   sacubitril-valsartan (ENTRESTO) 49-51 MG Take 1 tablet by mouth 2 (two) times daily. 180 tablet 3   Semaglutide,0.25 or 0.5MG /DOS, (OZEMPIC, 0.25 OR 0.5 MG/DOSE,) 2 MG/3ML SOPN Inject 0.25 mg into the skin once a week. 3 mL 1   torsemide (DEMADEX) 20 MG tablet Take 2 tablets (40 mg total) by mouth in the morning and at bedtime. 180 tablet 3   No current facility-administered medications for this visit.   Vitals:   02/09/23 1541  BP: (!) 126/99  Pulse: 85  SpO2: 97%  Weight: 208 lb (94.3 kg)   Wt Readings  from Last 3 Encounters:  02/09/23 208 lb (94.3 kg)  02/04/23 202 lb 12.8 oz (92 kg)  01/16/23 214 lb (97.1 kg)   Lab Results  Component Value Date   CREATININE 1.16 (H) 02/04/2023   CREATININE 1.25 (H) 01/10/2023   CREATININE 1.44 (H) 12/28/2022   PHYSICAL EXAM:  General:  Well appearing. No resp difficulty HEENT: normal Neck: supple. JVP flat. No lymphadenopathy or thryomegaly appreciated. Cor: PMI normal. Regular rate & rhythm. No rubs, gallops. II/ VI murmur LSB Lungs: clear Abdomen: soft, nontender, nondistended. No hepatosplenomegaly. No bruits or masses.  Extremities: no cyanosis, clubbing, rash, edema Neuro: alert & orientedx3, cranial nerves grossly intact. Moves all 4 extremities w/o difficulty. Affect pleasant.   ECG: not done   ASSESSMENT & PLAN:  1: NICM with reduced ejection fraction- - suspect due to previous cocaine use and uncontrolled HTN  - NYHA class II - euvolemic - not weighing daily; reminded to call for an overnight weight gain of > 2 pounds or a weekly weight gain of > 5 pounds - weight down 6 pounds from last visit here 1 month ago - Echo 10/11/20: EF 30-35% along with moderate TR.  - Echo 03/20/21: EF 25-30% along with severely elevated PA pressure of 70.8 mmHg, mild LAE, mild MR and mild/moderate TR - Echo 06/30/21: EF  30-35% along with mild MR. - Echo 09/18/21: EF  25-30%.   - Echo 07/25/22: EF 25-30% with moderately elevated PA pressure of 50.7 mmHg, mild LAE, mild MR, small pericardial effusion and moderate/ severe TR - Cardiac MRI 05/19/21:    1. Mild LV dilatation, mild hypertrophy, and mild systolic   dysfunction (EF 42%)    2. No late gadolinium enhancement to suggest myocardial scar   3. Normal RV size and systolic function (EF 50%) - reviewed keeping total daily fluid intake to 60-64 ounces - saw cardiology Shea Evans) 10/24 - saw EP Lalla Brothers) 06/23; was to have ICD implanted 10/12/21 but it was cancelled due to anemia - continue carvedilol  6.25mg   BID - continue entresto 49/51mg  BID  - continue farxiga 10mg  daily - continue digoxin 0.0625mg  daily; dig level 11/06/22 was 0.3 - continue torsemide 40mg  BID (she will go to [facility] pharmacy today and let them know she can't afford her copays) - BMP next visit if not done elsewhere - spironolactone had been stopped due to hyperkalemia and concern of appropriate f/u - not a candidate for advanced therapies due to repeated + drug screens - BNP 11/05/22 was 1267.5  2: HTN- - BP 126/99 (resuming torsemide today once she picks it up) - saw PCP Caralee Ates) 09/24 - BMP 02/04/23 showed sodium 141, potassium 3.8, creatinine 1.16 & GFR 62  3: DM- - A1c 11/05/22 was 9.6%  4: Anemia- - Hg 01/10/23 showed hemoglobin 10.9  5: Substance use- - no tobacco - no drug use since 07/24 - continues to drink 10 twelve cans of beer ~ once/ week; encouraged her to decrease consumption of any alcohol  She has upcoming appt with PCP, cardiology & EP so will have her return here in 6 weeks, sooner if needed.

## 2023-02-09 ENCOUNTER — Other Ambulatory Visit: Payer: Self-pay

## 2023-02-09 ENCOUNTER — Encounter: Payer: Self-pay | Admitting: Family

## 2023-02-09 ENCOUNTER — Ambulatory Visit: Payer: Medicaid Other | Attending: Family | Admitting: Family

## 2023-02-09 VITALS — BP 126/99 | HR 85 | Wt 208.0 lb

## 2023-02-09 DIAGNOSIS — I5022 Chronic systolic (congestive) heart failure: Secondary | ICD-10-CM | POA: Insufficient documentation

## 2023-02-09 DIAGNOSIS — I130 Hypertensive heart and chronic kidney disease with heart failure and stage 1 through stage 4 chronic kidney disease, or unspecified chronic kidney disease: Secondary | ICD-10-CM | POA: Diagnosis not present

## 2023-02-09 DIAGNOSIS — E119 Type 2 diabetes mellitus without complications: Secondary | ICD-10-CM

## 2023-02-09 DIAGNOSIS — D509 Iron deficiency anemia, unspecified: Secondary | ICD-10-CM

## 2023-02-09 DIAGNOSIS — I5043 Acute on chronic combined systolic (congestive) and diastolic (congestive) heart failure: Secondary | ICD-10-CM | POA: Diagnosis present

## 2023-02-09 DIAGNOSIS — D631 Anemia in chronic kidney disease: Secondary | ICD-10-CM | POA: Diagnosis not present

## 2023-02-09 DIAGNOSIS — N182 Chronic kidney disease, stage 2 (mild): Secondary | ICD-10-CM | POA: Insufficient documentation

## 2023-02-09 DIAGNOSIS — E1122 Type 2 diabetes mellitus with diabetic chronic kidney disease: Secondary | ICD-10-CM | POA: Diagnosis not present

## 2023-02-09 DIAGNOSIS — F1090 Alcohol use, unspecified, uncomplicated: Secondary | ICD-10-CM | POA: Diagnosis not present

## 2023-02-09 DIAGNOSIS — I428 Other cardiomyopathies: Secondary | ICD-10-CM

## 2023-02-09 DIAGNOSIS — F1910 Other psychoactive substance abuse, uncomplicated: Secondary | ICD-10-CM

## 2023-02-09 DIAGNOSIS — I10 Essential (primary) hypertension: Secondary | ICD-10-CM

## 2023-02-09 DIAGNOSIS — Z794 Long term (current) use of insulin: Secondary | ICD-10-CM | POA: Diagnosis not present

## 2023-02-09 MED ORDER — OZEMPIC (0.25 OR 0.5 MG/DOSE) 2 MG/3ML ~~LOC~~ SOPN
0.2500 mg | PEN_INJECTOR | SUBCUTANEOUS | 1 refills | Status: DC
  Filled 2023-02-09: qty 3, 28d supply, fill #0

## 2023-02-09 NOTE — Patient Instructions (Addendum)
Go to the pharmacy and pick up your torsemide.    Keep daily fluid intake to 60-64 ounces daily

## 2023-02-11 ENCOUNTER — Telehealth: Payer: Self-pay | Admitting: Pharmacist

## 2023-02-11 ENCOUNTER — Other Ambulatory Visit: Payer: Medicaid Other | Admitting: Pharmacist

## 2023-02-11 NOTE — Progress Notes (Signed)
Outreach Note  02/11/2023 Name: YICHEN COTRELL MRN: [id number] DOB: 1985-11-13  Referred by: Margarita Mail, DO Reason for referral : No chief complaint on file.   Was unable to reach patient via telephone today and have left HIPAA compliant voicemail asking patient to return my call. Outreach attempt #2    Follow Up Plan: Will attempt to reach patient by telephone again within the next 2 weeks  Estelle Grumbles, PharmD, [facility] [phone]

## 2023-02-15 NOTE — Progress Notes (Unsigned)
Established Patient Office Visit  Subjective:  Patient ID: Molly Howard, female    DOB: [birth year]  Age: 37 y.o. MRN: [id number]  CC:  No chief complaint on file.   HPI Molly Howard presents for follow up. Is having some right knee pain that started a few days ago. Pain is on the lateral aspect and worse with walking. Cannot take NSAID's. Does feel like the knee is unstable, especially with prolonged standing.   Hypertension/HFrEF/NICM: -Medications: Torsemide 40 mg BID, Carvedilol 3.125 mg BID, Entresto, Farxiga 10 mg, Digoxin 0.125 mg  -Has been compliant with her medications for the last month and doing well overall -Follows with Cardiology, last seen 01/09/23 -Last echo 07/25/22 showing reduced EF at 25-30% with global hypokinesis of the left ventricle   Diabetes Type 2: -Last A1c 7/24 11.7% -Medications: Fargixa 10 mg, Lantus changed to 20 units BID, started on Mounjaro at LOV but did not pick it up due to cost -Patient states she is general compliant with the above medications and reports no side effects.  -Checking BG at home: fasting 250. Post-prandial 140 -Eye exam: Referral placed at last visit, not scheduled, will give phone number to call -Foot exam: Up-to-date 7/24 -Microalbumin: UTD 6/24 -Statin: yes  -PNA vaccine: Prevnar 23 in 7/22, Prevnar 20 due today -Denies symptoms of hypoglycemia, polyuria, polydipsia, numbness extremities, foot ulcers/trauma.   HLD: -Medications: Lipitor 20 mg  -Patient is compliant with above medications and reports no side effects.  -Last lipid panel: Lipid Panel     Component Value Date/Time   CHOL 216 (H) 05/27/2022 1022   TRIG 124 05/27/2022 1022   HDL 46 05/27/2022 1022   CHOLHDL 4.7 05/27/2022 1022   VLDL 25 05/27/2022 1022   LDLCALC 145 (H) 05/27/2022 1022   LDLCALC 171 (H) 06/12/2021 1354   GERD: -Not currently on daily medications, taking Pepcid as needed   CKD/AoCD: -Last creatinine 9/24 1.25 -Last hgb 9/24 10.9     Past Medical History:  Diagnosis Date   Acid reflux    Chronic HFrEF (heart failure with reduced ejection fraction) (HCC)    a. 10/2019 Echo: EF 35-40%, GrII DD; b. 05/2020 Echo: EF 20-25%, glob HK; c. 10/2020 Echo: EF 30-35%, glob HK. GrII DD, Mildly red RV fxn. Mod TR; d.  05/2021 cMRI: EF 42%, no LGE. Nl RV size/fxn.   CKD (chronic kidney disease), stage II    Diabetes mellitus (HCC)    H/O medication noncompliance    Hypertension    Microcytic anemia    NICM (nonischemic cardiomyopathy) (HCC)    a. 10/2019 Echo: EF 35-40%; b. 10/2019 MV: No ischemia. Small apical defect-->breast attenuation; c. 05/2020 Echo: EF 20-25%; d. 10/2020 Echo: EF 30-35%, glob HK. GrII DD, Mildly red RV fxn. Mod TR; e. 05/2021 cMRI: EF 42%, no LGE. Nl RV size/fxn.   Obesity    Polysubstance abuse [facility])     Past Surgical History:  Procedure Laterality Date   CHOLECYSTECTOMY     HERNIA REPAIR     RIGHT HEART CATH N/A 07/28/2022   Procedure: RIGHT HEART CATH;  Surgeon: Iran Ouch, MD;  Location: [facility] INVASIVE CV LAB;  Service: Cardiovascular;  Laterality: N/A;    Family History  Problem Relation Age of Onset   Heart failure Mother        Onset of heart failure 40s.  Died in 2022-05-15.   Diabetes Mother    Hypertension Father    Diabetes Father  Social History   Socioeconomic History   Marital status: Single    Spouse name: Not on file   Number of children: Not on file   Years of education: Not on file   Highest education level: Not on file  Occupational History   Not on file  Tobacco Use   Smoking status: Former    Current packs/day: 0.00    Types: Cigarettes    Quit date: 2022    Years since quitting: 2.7   Smokeless tobacco: Never  Vaping Use   Vaping status: Never Used  Substance and Sexual Activity   Alcohol use: Not Currently    Comment: ~ 4 shots/day on weekends only   Drug use: Not Currently    Types: Cocaine    Comment: Admits to using cocaine up and will February  2024.   Sexual activity: Not Currently    Birth control/protection: None  Other Topics Concern   Not on file  Social History Narrative   Lives locally.  Does not routinely exercise.  Has been using cocaine.   Social Determinants of Health   Financial Resource Strain: High Risk (06/16/2020)   Overall Financial Resource Strain (CARDIA)    Difficulty of Paying Living Expenses: Hard  Food Insecurity: Food Insecurity Present (11/24/2022)   Hunger Vital Sign    Worried About Running Out of Food in the Last Year: Sometimes true    Ran Out of Food in the Last Year: Sometimes true  Transportation Needs: Unmet Transportation Needs (11/24/2022)   PRAPARE - Administrator, Civil Service (Medical): Yes    Lack of Transportation (Non-Medical): Yes  Physical Activity: Not on file  Stress: Not on file  Social Connections: Not on file  Intimate Partner Violence: Not At Risk (11/06/2022)   Humiliation, Afraid, Rape, and Kick questionnaire    Fear of Current or Ex-Partner: No    Emotionally Abused: No    Physically Abused: No    Sexually Abused: No    Outpatient Medications Prior to Visit  Medication Sig Dispense Refill   Accu-Chek Softclix Lancets lancets Use as instructed 100 each 12   atorvastatin (LIPITOR) 20 MG tablet Take 1 tablet (20 mg total) by mouth daily. 90 tablet 3   carvedilol (COREG) 6.25 MG tablet Take 1 tablet (6.25 mg total) by mouth 2 (two) times daily with a meal. 180 tablet 3   Continuous Glucose Sensor (FREESTYLE LIBRE 3 SENSOR) MISC Place 1 sensor on the skin every 14 days. Use to check glucose continuously 2 each 12   dapagliflozin propanediol (FARXIGA) 10 MG TABS tablet Take 1 tablet (10 mg total) by mouth once daily. 90 tablet 1   digoxin (LANOXIN) 0.125 MG tablet Take 0.5 tablets (0.0625 mg total) by mouth daily. 30 tablet 3   ferrous sulfate 325 (65 FE) MG tablet Take 1 tablet (325 mg total) by mouth daily with breakfast. (Patient not taking: Reported on  02/09/2023) 30 tablet 3   nitroGLYCERIN (NITROSTAT) 0.4 MG SL tablet Place 1 tablet (0.4 mg total) under the tongue every 5 (five) minutes as needed for chest pain. (Patient not taking: Reported on 02/09/2023) 25 tablet 12   sacubitril-valsartan (ENTRESTO) 49-51 MG Take 1 tablet by mouth 2 (two) times daily. 180 tablet 3   Semaglutide,0.25 or 0.5MG /DOS, (OZEMPIC, 0.25 OR 0.5 MG/DOSE,) 2 MG/3ML SOPN Inject 0.25 mg into the skin once a week. 3 mL 1   torsemide (DEMADEX) 20 MG tablet Take 2 tablets (40 mg total) by mouth  in the morning and at bedtime. (Patient not taking: Reported on 02/09/2023) 180 tablet 3   No facility-administered medications prior to visit.    Allergies  Allergen Reactions   No [employer] Food Allergies Rash and Other (See Comments)    Lemons    ROS Review of Systems  Constitutional:  Negative for chills and fever.  Respiratory:  Negative for cough and shortness of breath.   Cardiovascular:  Negative for chest pain and leg swelling.  Gastrointestinal:  Negative for diarrhea, nausea and vomiting.  Musculoskeletal:  Positive for joint swelling.      Objective:    Physical Exam Constitutional:      Appearance: Normal appearance.  HENT:     Head: Normocephalic and atraumatic.  Eyes:     Conjunctiva/sclera: Conjunctivae normal.  Cardiovascular:     Rate and Rhythm: Normal rate and regular rhythm.  Pulmonary:     Effort: Pulmonary effort is normal.     Breath sounds: Normal breath sounds.  Musculoskeletal:     Right knee: Effusion present. No bony tenderness or crepitus. Normal range of motion. Tenderness present over the lateral joint line.     Right lower leg: No edema.     Left lower leg: No edema.  Skin:    General: Skin is warm and dry.  Neurological:     General: No focal deficit present.     Mental Status: She is alert. Mental status is at baseline.  Psychiatric:        Mood and Affect: Mood normal.        Behavior: Behavior normal.     There  were no vitals taken for this visit. Wt Readings from Last 3 Encounters:  02/09/23 208 lb (94.3 kg)  02/04/23 202 lb 12.8 oz (92 kg)  01/16/23 214 lb (97.1 kg)     Health Maintenance Due  Topic Date Due   COVID-19 Vaccine (1) Never done    There are no preventive care reminders to display for this patient.  Lab Results  Component Value Date   TSH 5.114 (H) 11/09/2021   Lab Results  Component Value Date   WBC 6.0 01/10/2023   HGB 10.9 (L) 01/10/2023   HCT 36.3 01/10/2023   MCV 92.6 01/10/2023   PLT 262 01/10/2023   Lab Results  Component Value Date   NA 141 02/04/2023   K 3.8 02/04/2023   CO2 25 02/04/2023   GLUCOSE 154 (H) 02/04/2023   BUN 16 02/04/2023   CREATININE 1.16 (H) 02/04/2023   BILITOT 0.7 11/06/2022   ALKPHOS 167 (H) 11/06/2022   AST 18 11/06/2022   ALT 11 11/06/2022   PROT 6.0 (L) 11/06/2022   ALBUMIN 1.8 (L) 11/06/2022   CALCIUM 8.4 (L) 02/04/2023   ANIONGAP 9 01/10/2023   EGFR 62 02/04/2023   Lab Results  Component Value Date   CHOL 216 (H) 05/27/2022   Lab Results  Component Value Date   HDL 46 05/27/2022   Lab Results  Component Value Date   LDLCALC 145 (H) 05/27/2022   Lab Results  Component Value Date   TRIG 124 05/27/2022   Lab Results  Component Value Date   CHOLHDL 4.7 05/27/2022   Lab Results  Component Value Date   HGBA1C 11.7 (H) 11/05/2022      Assessment & Plan:   1. 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 On Farxiga and will  increase Lantus to 25 units BID. Recheck A1c in 1 month.   - AMB Referral to [facility] ([facility] Patients)  2. HFrEF (heart failure with reduced ejection fraction) (HCC): Following back with the heart failure clinic, note and labs reviewed from 01/10/23.  3. Acute pain of right knee: X-ray ordered, for now recommend Voltaren gel or Tylenol.   - DG Knee  Complete 4 Views Right; Future  4. Vaccine for streptococcus pneumoniae and influenza: Flu and Prevnar 20 vaccines administered today.   - Flu vaccine trivalent PF, 6mos and older(Flulaval,Afluria,Fluarix,Fluzone) - Pneumococcal conjugate vaccine 20-valent (Prevnar 20)   Follow-up: No follow-ups on file.    Margarita Mail, DO

## 2023-02-16 ENCOUNTER — Telehealth: Payer: Self-pay

## 2023-02-16 ENCOUNTER — Other Ambulatory Visit: Payer: Self-pay | Admitting: Pharmacist

## 2023-02-16 NOTE — Progress Notes (Signed)
02/16/2023 Name: Molly Howard MRN: [id number] DOB: 1985-11-18  Chief Complaint  Patient presents with   Medication Adherence   Medication Management    Molly Howard is a 37 y.o. year old female who presented for a telephone visit.   They were referred to the pharmacist by their PCP for assistance in managing medication access.      Subjective:   Care Team: Primary Care Provider: Margarita Mail, DO; Next Scheduled Visit: 02/17/2023 Social Worker: Shaune Leeks Nurse Care Manager: Heidi Dach, RN; Next Schedule Visit: 03/01/2023 Cardiologist: Donald Siva; Next Schedule Visit: 03/10/2023 Heart Failure Specialist: Delma Freeze, FNP; Next Schedule Visit: 03/24/2023  Medication Access/Adherence  Current Pharmacy:  [facility] [address] [place] [address] Phone: [phone] Fax: [phone]   Patient reports affordability concerns with their medications: Yes  Patient reports access/transportation concerns to their pharmacy: No  Patient reports adherence concerns with their medications:  No    Reports sometimes difficult to afford her Medicaid copayments from the pharmacy - Note have advised patient that she can ask pharmacy to bill her for copayment amount if she is unable to afford cost so that she can pick up her medications  Diabetes:   Current medications:  - Farxiga 10 mg daily - Ozempic 0.25 mg weekly - Reports stopped taking her Lantus   Medications tried in the past: Mounjaro (cost)   Current glucose readings: recalls last checked yesterday and reading in 200s, but does not recall specific reading   Reports picked up and started using Freestyle Libre 3 continuous glucose monitor (CGM), but reports has not changed sensor in >2 weeks. Also reports linked the CGM to her sister's phone, rather than her own as her phone was not in service a the time   Patient denies recent symptoms of  hypoglycemia    Objective:  Lab Results  Component Value Date   HGBA1C 11.7 (H) 11/05/2022    Lab Results  Component Value Date   CREATININE 1.16 (H) 02/04/2023   BUN 16 02/04/2023   NA 141 02/04/2023   K 3.8 02/04/2023   CL 102 02/04/2023   CO2 25 02/04/2023    Lab Results  Component Value Date   CHOL 216 (H) 05/27/2022   HDL 46 05/27/2022   LDLCALC 145 (H) 05/27/2022   TRIG 124 05/27/2022   CHOLHDL 4.7 05/27/2022    Current Outpatient Medications on File Prior to Visit  Medication Sig Dispense Refill   dapagliflozin propanediol (FARXIGA) 10 MG TABS tablet Take 1 tablet (10 mg total) by mouth once daily. 90 tablet 1   Accu-Chek Softclix Lancets lancets Use as instructed 100 each 12   atorvastatin (LIPITOR) 20 MG tablet Take 1 tablet (20 mg total) by mouth daily. 90 tablet 3   carvedilol (COREG) 6.25 MG tablet Take 1 tablet (6.25 mg total) by mouth 2 (two) times daily with a meal. 180 tablet 3   Continuous Glucose Sensor (FREESTYLE LIBRE 3 SENSOR) MISC Place 1 sensor on the skin every 14 days. Use to check glucose continuously 2 each 12   digoxin (LANOXIN) 0.125 MG tablet Take 0.5 tablets (0.0625 mg total) by mouth daily. 30 tablet 3   ferrous sulfate 325 (65 FE) MG tablet Take 1 tablet (325 mg total) by mouth daily with breakfast. (Patient not taking: Reported on 02/09/2023) 30 tablet 3   nitroGLYCERIN (NITROSTAT) 0.4 MG SL tablet Place 1 tablet (0.4 mg total) under  the tongue every 5 (five) minutes as needed for chest pain. (Patient not taking: Reported on 02/09/2023) 25 tablet 12   sacubitril-valsartan (ENTRESTO) 49-51 MG Take 1 tablet by mouth 2 (two) times daily. 180 tablet 3   Semaglutide,0.25 or 0.5MG /DOS, (OZEMPIC, 0.25 OR 0.5 MG/DOSE,) 2 MG/3ML SOPN Inject 0.25 mg into the skin once a week. 3 mL 1   torsemide (DEMADEX) 20 MG tablet Take 2 tablets (40 mg total) by mouth in the morning and at bedtime. (Patient not taking: Reported on 02/09/2023) 180 tablet 3    [DISCONTINUED] Potassium Chloride 40 MEQ/15ML (20%) SOLN Take 40 mEq by mouth 2 (two) times daily. 900 mL 5   No current facility-administered medications on file prior to visit.     Assessment/Plan:   Encourage patient to attend appointment with PCP tomorrow as scheduled.  Again patient declined to review medications today. However, agreed to bring her medications with her to appointment with PCP tomorrow  Diabetes: - Currently uncontrolled - Have reviewed long term cardiovascular and renal outcomes of uncontrolled blood sugar - Have counseled patient on signs and symptoms of hypoglycemia and encourage patient to carry glucose tablets with her to use if needed in case of hypoglycemia - Have reviewed dietary modifications including importance of having regular well-balanced meals throughout the day, while controlling carbohydrate portion sizes - Provide patient with counseling on Freestyle Libre 3 CGM.  Patient to download and use Freestyle Libre 3 app on smart phone, apply new sensor and start using her own smart phone to view blood sugar readings/monitor blood sugar - Send message to PCP to collaborate regarding information received from patient today and concern about patient having self-discontinued her insulin     Follow Up Plan: Clinical Pharmacist will follow up with patient by telephone on 03/14/2023 at 11:00 AM     Estelle Grumbles, PharmD, [facility] [phone]

## 2023-02-16 NOTE — Telephone Encounter (Signed)
Transition Care Management Unsuccessful Follow-up Telephone Call  Date of discharge and from where:  [place] 9/15  Attempts:  1st Attempt  Reason for unsuccessful TCM follow-up call:  No answer/busy   Lenard Forth [place]  [facility], [facility] Guide, Phone: [phone] Website: [email or web address]

## 2023-02-16 NOTE — Patient Instructions (Signed)
Goals Addressed             This Visit's Progress    Pharmacy Goals       Please follow up with [facility] to pick up your medications.  [facility] [place] [address] [place], [place], [place] [address] [phone]  Thank you!  Estelle Grumbles, PharmD, [facility] [phone]

## 2023-02-17 ENCOUNTER — Other Ambulatory Visit: Payer: Self-pay

## 2023-02-17 ENCOUNTER — Ambulatory Visit: Payer: Medicaid Other | Admitting: Internal Medicine

## 2023-02-17 ENCOUNTER — Encounter: Payer: Self-pay | Admitting: Internal Medicine

## 2023-02-17 VITALS — HR 79 | Temp 97.3°F | Resp 18 | Ht 67.0 in | Wt 205.7 lb

## 2023-02-17 DIAGNOSIS — I5020 Unspecified systolic (congestive) heart failure: Secondary | ICD-10-CM

## 2023-02-17 DIAGNOSIS — E1129 Type 2 diabetes mellitus with other diabetic kidney complication: Secondary | ICD-10-CM

## 2023-02-17 DIAGNOSIS — R269 Unspecified abnormalities of gait and mobility: Secondary | ICD-10-CM | POA: Diagnosis not present

## 2023-02-17 DIAGNOSIS — Z7984 Long term (current) use of oral hypoglycemic drugs: Secondary | ICD-10-CM | POA: Diagnosis not present

## 2023-02-17 LAB — POCT GLYCOSYLATED HEMOGLOBIN (HGB A1C): Hemoglobin A1C: 7.7 % — AB (ref 4.0–5.6)

## 2023-02-17 MED ORDER — OZEMPIC (0.25 OR 0.5 MG/DOSE) 2 MG/3ML ~~LOC~~ SOPN
0.5000 mg | PEN_INJECTOR | SUBCUTANEOUS | 1 refills | Status: DC
  Filled 2023-02-17: qty 3, fill #0
  Filled 2023-04-04: qty 3, 28d supply, fill #0

## 2023-02-17 MED ORDER — DAPAGLIFLOZIN PROPANEDIOL 10 MG PO TABS
10.0000 mg | ORAL_TABLET | Freq: Every day | ORAL | 1 refills | Status: AC
Start: 2023-02-17 — End: ?
  Filled 2023-02-17 – 2023-04-04 (×2): qty 90, 90d supply, fill #0

## 2023-02-18 ENCOUNTER — Telehealth: Payer: Self-pay

## 2023-02-18 NOTE — Telephone Encounter (Signed)
Transition Care Management Unsuccessful Follow-up Telephone Call  Date of discharge and from where:  [place] 9/15  Attempts:  2nd Attempt  Reason for unsuccessful TCM follow-up call:  No answer/busy   Lenard Forth [place]  [facility], [facility] Guide, Phone: [phone] Website: [email or web address]

## 2023-03-01 ENCOUNTER — Ambulatory Visit: Payer: Self-pay

## 2023-03-01 ENCOUNTER — Telehealth: Payer: Medicaid Other | Admitting: Physician Assistant

## 2023-03-01 ENCOUNTER — Other Ambulatory Visit: Payer: Self-pay | Admitting: *Deleted

## 2023-03-01 NOTE — Patient Outreach (Signed)
Medicaid Managed Care   Nurse Care Manager Note  03/01/2023 Name:  Molly Howard MRN:  [id number] DOB:  [birth year]  Molly Howard is an 37 y.o. year old female who is a primary patient of Molly Mail, DO.  The [facility] Managed Care Coordination team was consulted for assistance with:    DMII  Molly Howard was given information about Medicaid Managed Care Coordination team services today. Molly Howard Patient agreed to services and verbal consent obtained.  Engaged with patient by telephone for follow up visit in response to provider referral for case management and/or care coordination services.   Assessments/Interventions:  Review of past medical history, allergies, medications, health status, including review of consultants reports, laboratory and other test data, was performed as part of comprehensive evaluation and provision of chronic care management services.  SDOH (Social Determinants of Health) assessments and interventions performed: SDOH Interventions    Flowsheet Row Patient Outreach Telephone from 11/24/2022 in [place] [facility] Telephone from 06/30/2020 in [facility] Telephone from 06/16/2020 in [facility]  SDOH Interventions     Food Insecurity Interventions Other (Comment)  [working with BSW for food assistance] -- Other (Comment)  [will help with transportation to get to food pantries as that was the main barrier]  Housing Interventions Intervention Not Indicated -- Other (Comment)  [referred to [employer] to apply for low income water assistance program]  Transportation Interventions Payor Benefit  [Provided with [employer] transportation [phone] [employer]  Utilities Interventions Intervention Not Indicated -- --  Financial Strain Interventions -- -- Other (Comment)  [providing [employer] gift cards to help with medication costs]       Care  Plan  Allergies  Allergen Reactions   No [employer] Food Allergies Rash and Other (See Comments)    Lemons    Medications Reviewed Today     Reviewed by Heidi Dach, RN (Registered Nurse) on 03/01/23 at 1343  Med List Status: <None>   Medication Order Taking? Sig Documenting Provider Last Dose Status Informant  Accu-Chek Softclix Lancets lancets [id number] Yes Use as instructed Molly Mail, DO Taking Active   atorvastatin (LIPITOR) 20 MG tablet [id number] Yes Take 1 tablet (20 mg total) by mouth daily. Sondra Barges, PA-C Taking Active   carvedilol (COREG) 6.25 MG tablet [id number] Yes Take 1 tablet (6.25 mg total) by mouth 2 (two) times daily with a meal. Dunn, Raymon Mutton, PA-C Taking Active   Continuous Glucose Sensor (FREESTYLE LIBRE 3 SENSOR) [place] [id number] Yes Place 1 sensor on the skin every 14 days. Use to check glucose continuously Molly Mail, DO Taking Active   dapagliflozin propanediol (FARXIGA) 10 MG TABS tablet [id number] Yes Take 1 tablet (10 mg total) by mouth once daily. Molly Mail, DO Taking Active   digoxin (LANOXIN) 0.125 MG tablet [id number] Yes Take 0.5 tablets (0.0625 mg total) by mouth daily. Sondra Barges, PA-C Taking Active     Discontinued 10/18/20 1034 (Reorder) sacubitril-valsartan (ENTRESTO) 49-51 MG [id number] Yes Take 1 tablet by mouth 2 (two) times daily. Sondra Barges, PA-C Taking Active   Semaglutide,0.25 or 0.5MG /DOS, (OZEMPIC, 0.25 OR 0.5 MG/DOSE,) 2 MG/3ML SOPN [id number] Yes Inject 0.5 mg into the skin once a week. Molly Mail, DO Taking Active   torsemide (DEMADEX) 20 MG tablet [id number] Yes Take 20 mg by mouth 2 (two) times daily. [provider] Taking Active   Med List Note Molly Howard, CPhT 11/27/20  1610): Patient states she has been out of her medications for "a while now". It is due to financial limitations.            Patient Active Problem List   Diagnosis Date Noted   Cardiogenic shock (HCC)  11/14/2022   Bilateral leg edema 11/06/2022   NICM (nonischemic cardiomyopathy) (HCC) 07/31/2022   Hypocalcemia 07/25/2022   Leg edema 07/25/2022   Acute on chronic combined systolic and diastolic CHF (congestive heart failure) (HCC) 07/25/2022   GERD without esophagitis 07/24/2022   Cocaine abuse (HCC) 02/12/2022   Acute pulmonary edema (HCC)    Syncope 02/09/2022   Menorrhagia with regular cycle 06/22/2021   Dyslipidemia 11/27/2020   Chronic kidney disease (CKD), stage II (mild) 11/27/2020   Hypomagnesemia 10/17/2020   Dyspnea 05/21/2020   Essential hypertension 05/20/2020   Diabetes mellitus, type II (HCC) 05/20/2020   Chronic anemia 05/20/2020   Reactive thrombocytosis 05/20/2020   Obesity (BMI 30-39.9) 05/20/2020   Chronic combined systolic and diastolic heart failure (HCC) 11/01/2019   Financial difficulties 11/01/2019   Medication management 11/01/2019   Lung nodule 11/01/2019   Nonischemic cardiomyopathy (HCC) 11/01/2019   Polysubstance abuse (HCC) 11/01/2019   Tobacco use 11/01/2019   Swelling     Conditions to be addressed/monitored per PCP order:  DMII  Care Plan : RN Care Manager Plan of Care  Updates made by Heidi Dach, RN since 03/01/2023 12:00 AM     Problem: Health Management needs related to DMII      Long-Range Goal: Development of Plan of Care to address Health Management needs related to DMII   Start Date: 11/24/2022  Expected End Date: 02/22/2023  Note:   Current Barriers:  Chronic Disease Management support and education needs related to DMII  RNCM Clinical Goal(s):  Patient will verbalize understanding of plan for management of DMII as evidenced by patient reports take all medications exactly as prescribed and will call provider for medication related questions as evidenced by patient reports    attend all scheduled medical appointments: 11/7 with Cardiology, 11/11 with Pharmacist, 11/13 for Consult with Dr. Lalla Brothers, 11/21 with [facility] as  evidenced by provider documentation in EMR        continue to work with RN Care Manager and/or Social Worker to address care management and care coordination needs related to DMII as evidenced by adherence to CM Team Scheduled appointments     work with Child psychotherapist to address Financial constraints related to no income and Limited access to food related to the management of DMII as evidenced by review of EMR and patient or Child psychotherapist report     through collaboration with Medical illustrator, provider, and care team.   Interventions: Inter-disciplinary care team collaboration (see longitudinal plan of care) Evaluation of current treatment plan related to  self management and patient's adherence to plan as established by provider   Diabetes:  (Status: Goal on Track (progressing): YES.) Long Term Goal   Lab Results  Component Value Date   HGBA1C 7.7 (A) 02/17/2023   @ Assessed patient's understanding of A1c goal: <7% Provided education to patient about basic DM disease process; Reviewed medications with patient and discussed importance of medication adherence;        Reviewed prescribed diet with patient MyPlate method, decrease carbohydrates, sweets and sweet drinks; Discussed plans with patient for ongoing care management follow up and provided patient with direct contact information for care management team;      Reviewed scheduled/upcoming  provider appointments including: 11/7 with Cardiology, 11/11 with Pharmacist, 11/13 for Consult with Dr. Lalla Brothers, 11/21 with [facility];         Review of patient status, including review of consultants reports, relevant laboratory and other test results, and medications completed;       Assessed social determinant of health barriers;        Provided patient with [facility] transportation [phone], advised to call today to arrange transportation on 9/10, address and phone number provided Advised patient to contact [facility] member services [phone] for  member benefitss(fresh fruit and vegetables)-revisited Discussed disability application-patient waiting on determination Advised patient to call [facility] to check on prescription glasses-ensured patient has new glasses Advised patient to journal BS readings and take to next PCP appointment Assisted patient with scheduling PT evaluation-scheduled on 03/07/23 Advised patient to contact PCP with concerns of right knee pain and swelling Congratulated patient in improving her A1C, provided encouragement to continue exercising and making changes to her diet  Patient Goals/Self-Care Activities: Take medications as prescribed   Attend all scheduled provider appointments Call provider office for new concerns or questions  Work with the social worker to address care coordination needs and will continue to work with the clinical team to address health care and disease management related needs schedule appointment with eye doctor drink 6 to 8 glasses of water each day fill half of plate with vegetables limit fast food meals to no more than 1 per week       Follow Up:  Patient agrees to Care Plan and Follow-up.  Plan: The Managed Medicaid care management team will reach out to the patient again over the next 30 days.  Date/time of next scheduled RN care management/care coordination outreach:  04/04/23 at 1:15pm  Estanislado Emms RN, BSN [place]  [facility] RN Care Coordinator [phone]

## 2023-03-01 NOTE — Telephone Encounter (Signed)
  Chief Complaint: Right knee swollen and painful - gout Symptoms: above Frequency: 3 days - but is an ongoing issue Pertinent Negatives: Patient denies  Disposition: [] ED /[] Urgent Care (no appt availability in office) / [x] Appointment(In office/virtual)/ []  [place] [facility]/ [] Home Care/ [] Refused Recommended Disposition /[] [place] [facility]/ []  Follow-up with PCP Additional Notes: Pt thinks she is having a gout attack. Right knee is very swollen and painful. VV with Erin mecum for this afternoon.   Summary: Pt knee is still swollen (last 3 days) and she feel she has gout   Pt requested to speak with her provider to advise that her knee is still swollen (last 3 days) and she feels that she has gout. There were no appts available with pcp until 03/21/23. Pt requests call back. Cb# [phone]       Reason for Disposition  [1] SEVERE pain (e.g., excruciating, unable to walk) AND [2] not improved after 2 hours of pain medicine  Answer Assessment - Initial Assessment Questions 1. LOCATION: "Where is the swelling located?"  (e.g., left, right, both knees)     right 2. ONSET: "When did the swelling start?" "Does it come and go, or is it there all the time?"      3 days 3. SWELLING: "How bad is the swelling?" Or, "How large is it?" (e.g., mild, moderate, severe; size of localized swelling)    - NONE: No joint swelling.   - LOCALIZED: Localized; small area of puffy or swollen skin (e.g., insect bite, skin irritation).   - MILD: Joint looks or feels mildly swollen or puffy.   - MODERATE: Swollen; interferes with normal activities (e.g., work or school); can't move joint normally (bend and straighten completely); may be limping.   - SEVERE: Very swollen; can't move swollen joint at all; limping a lot or unable to walk.     Has gone down a bit 4. PAIN: "Is there any pain?" If Yes, ask: "How bad is it?" (Scale 1-10; or mild, moderate, severe)   - NONE (0): no pain.   - MILD (1-3):  doesn't interfere with normal activities.    - MODERATE (4-7): interferes with normal activities (e.g., work or school) or awakens from sleep, limping.    - SEVERE (8-10): excruciating pain, unable to do any normal activities, unable to walk.      severe 5. SETTING: "Has there been any recent work, exercise or other activity that involved that part of the body?"      no 7. ASSOCIATED SYMPTOMS: "Is there any pain or redness?"     Pain yes  - redness unsure 8. OTHER SYMPTOMS: "Do you have any other symptoms?" (e.g., chest pain, difficulty breathing, fever, calf pain)     no  Protocols used: Knee Swelling-A-AH

## 2023-03-01 NOTE — Patient Instructions (Signed)
Visit Information  Molly Howard was given information about Medicaid Managed Care team care coordination services as a part of their [facility] Community Plan Medicaid benefit. Molly Howard verbally consented to engagement with the [facility] Managed Care team.   If you are experiencing a medical emergency, please call 911 or report to your local emergency department or urgent care.   If you have a non-emergency medical problem during routine business hours, please contact your provider's office and ask to speak with a nurse.   For questions related to your [facility], please call: [phone] or visit the homepage here: [email or web address]  If you would like to schedule transportation through your [employer] [facility], please call the following number at least 2 days in advance of your appointment: [phone]   Rides for urgent appointments can also be made after hours by calling Member Services.  Call the Behavioral Health Crisis Line at [phone], at any time, 24 hours a day, 7 days a week. If you are in danger or need immediate medical attention call 911.  If you would like help to quit smoking, call 1-800-QUIT-NOW ((613)578-5142) OR Espaol: 1-855-Djelo-Ya (6-010-932-3557) o para ms informacin haga clic aqu or Text READY to 322-025 to register via text  Molly Howard,   Please see education materials related to diabetes provided as Financial risk analyst.   The patient verbalized understanding of instructions, educational materials, and care plan provided today and agreed to receive a mailed copy of patient instructions, educational materials, and care plan.   Telephone follow up appointment with Managed Medicaid care management team member scheduled for:04/04/23 at 1:15pm  Molly Emms RN, BSN   [facility] RN Care  Coordinator [phone]   Following is a copy of your plan of care:  Care Plan : RN Care Manager Plan of Care  Updates made by Molly Dach, RN since 03/01/2023 12:00 AM     Problem: Health Management needs related to DMII      Long-Range Goal: Development of Plan of Care to address Health Management needs related to DMII   Start Date: 11/24/2022  Expected End Date: 02/22/2023  Note:   Current Barriers:  Chronic Disease Management support and education needs related to DMII  RNCM Clinical Goal(s):  Patient will verbalize understanding of plan for management of DMII as evidenced by patient reports take all medications exactly as prescribed and will call provider for medication related questions as evidenced by patient reports    attend all scheduled medical appointments: 11/7 with Cardiology, 11/11 with Pharmacist, 11/13 for Consult with Dr. Lalla Brothers, 11/21 with [facility] as evidenced by provider documentation in EMR        continue to work with RN Care Manager and/or Social Worker to address care management and care coordination needs related to DMII as evidenced by adherence to CM Team Scheduled appointments     work with Child psychotherapist to address Financial constraints related to no income and Limited access to food related to the management of DMII as evidenced by review of EMR and patient or Child psychotherapist report     through collaboration with Medical illustrator, provider, and care team.   Interventions: Inter-disciplinary care team collaboration (see longitudinal plan of care) Evaluation of current treatment plan related to  self management and patient's adherence to plan as established by provider   Diabetes:  (Status: Goal on Track (progressing): YES.) Long Term Goal   Lab Results  Component Value Date   HGBA1C 7.7 (A) 02/17/2023   @ Assessed patient's understanding of A1c goal: <7% Provided education to patient about basic DM disease process; Reviewed medications with  patient and discussed importance of medication adherence;        Reviewed prescribed diet with patient MyPlate method, decrease carbohydrates, sweets and sweet drinks; Discussed plans with patient for ongoing care management follow up and provided patient with direct contact information for care management team;      Reviewed scheduled/upcoming provider appointments including: 11/7 with Cardiology, 11/11 with Pharmacist, 11/13 for Consult with Dr. Lalla Brothers, 11/21 with [facility];         Review of patient status, including review of consultants reports, relevant laboratory and other test results, and medications completed;       Assessed social determinant of health barriers;        Provided patient with [facility] transportation [phone], advised to call today to arrange transportation on 9/10, address and phone number provided Advised patient to contact [facility] member services [phone] for member benefitss(fresh fruit and vegetables)-revisited Discussed disability application-patient waiting on determination Advised patient to call [facility] to check on prescription glasses-ensured patient has new glasses Advised patient to journal BS readings and take to next PCP appointment Assisted patient with scheduling PT evaluation-scheduled on 03/07/23 Advised patient to contact PCP with concerns of right knee pain and swelling Congratulated patient in improving her A1C, provided encouragement to continue exercising and making changes to her diet  Patient Goals/Self-Care Activities: Take medications as prescribed   Attend all scheduled provider appointments Call provider office for new concerns or questions  Work with the social worker to address care coordination needs and will continue to work with the clinical team to address health care and disease management related needs schedule appointment with eye doctor drink 6 to 8 glasses of water each day fill half of plate with vegetables limit  fast food meals to no more than 1 per week

## 2023-03-02 ENCOUNTER — Ambulatory Visit (INDEPENDENT_AMBULATORY_CARE_PROVIDER_SITE_OTHER): Payer: Medicaid Other | Admitting: Physician Assistant

## 2023-03-02 ENCOUNTER — Encounter: Payer: Self-pay | Admitting: Family Medicine

## 2023-03-02 ENCOUNTER — Other Ambulatory Visit: Payer: Self-pay

## 2023-03-02 VITALS — BP 142/88 | HR 79 | Temp 97.9°F | Resp 16 | Ht 67.0 in | Wt 202.7 lb

## 2023-03-02 DIAGNOSIS — M25561 Pain in right knee: Secondary | ICD-10-CM

## 2023-03-02 MED ORDER — PREDNISONE 20 MG PO TABS
40.0000 mg | ORAL_TABLET | Freq: Every day | ORAL | 0 refills | Status: AC
Start: 2023-03-02 — End: 2023-03-07
  Filled 2023-03-02: qty 10, 5d supply, fill #0

## 2023-03-02 NOTE — Patient Instructions (Addendum)
[facility] [address] [place], [place] [address] Phone: [phone] [email or web address]   Call tomorrow and see if they have an appointment for you. You also can do a Urgent Care walk in appointment with [facility]

## 2023-03-02 NOTE — Progress Notes (Signed)
Patient ID: AUTUMM STROHMAIER, female    DOB: 10-27-1985, 37 y.o.   MRN: [id number]  PCP: Margarita Mail, DO  Chief Complaint  Patient presents with   Knee Pain    R knee, possible gout onset for 3 days    Subjective:   Molly Howard is a 37 y.o. female, presents to clinic with CC of the following:  HPI  PT with knee pain on and off for the past 2 months She was changing a tire and was kneeling down and after that her right knee became swollen and painful, it hurts more when bearing weight and it does feel like it is going to give out on her, she did not have any injury or strain It is very painful similar to when she gets gout but she usually gets that in her foot, she has not had any redness or warmth in her knee She recently saw her PCP and also had a ER visit for right knee pain in the month of September she was referred to Ortho but she has not seen a specialist She has not worn a brace or took any medications  Hx of gout, last uric acid high but not on any medications Lab Results  Component Value Date   LABURIC 10.0 (H) 05/27/2022     Patient Active Problem List   Diagnosis Date Noted   Cardiogenic shock (HCC) 11/14/2022   Bilateral leg edema 11/06/2022   NICM (nonischemic cardiomyopathy) (HCC) 07/31/2022   Hypocalcemia 07/25/2022   Leg edema 07/25/2022   Acute on chronic combined systolic and diastolic CHF (congestive heart failure) (HCC) 07/25/2022   GERD without esophagitis 07/24/2022   Cocaine abuse (HCC) 02/12/2022   Acute pulmonary edema (HCC)    Syncope 02/09/2022   Menorrhagia with regular cycle 06/22/2021   Dyslipidemia 11/27/2020   Chronic kidney disease (CKD), stage II (mild) 11/27/2020   Hypomagnesemia 10/17/2020   Dyspnea 05/21/2020   Essential hypertension 05/20/2020   Diabetes mellitus, type II (HCC) 05/20/2020   Chronic anemia 05/20/2020   Reactive thrombocytosis 05/20/2020   Obesity (BMI 30-39.9) 05/20/2020   Chronic combined systolic and  diastolic heart failure (HCC) 11/01/2019   Financial difficulties 11/01/2019   Medication management 11/01/2019   Lung nodule 11/01/2019   Nonischemic cardiomyopathy (HCC) 11/01/2019   Polysubstance abuse (HCC) 11/01/2019   Tobacco use 11/01/2019   Swelling       Current Outpatient Medications:    Accu-Chek Softclix Lancets lancets, Use as instructed, Disp: 100 each, Rfl: 12   atorvastatin (LIPITOR) 20 MG tablet, Take 1 tablet (20 mg total) by mouth daily., Disp: 90 tablet, Rfl: 3   carvedilol (COREG) 6.25 MG tablet, Take 1 tablet (6.25 mg total) by mouth 2 (two) times daily with a meal., Disp: 180 tablet, Rfl: 3   Continuous Glucose Sensor (FREESTYLE LIBRE 3 SENSOR) MISC, Place 1 sensor on the skin every 14 days. Use to check glucose continuously, Disp: 2 each, Rfl: 12   dapagliflozin propanediol (FARXIGA) 10 MG TABS tablet, Take 1 tablet (10 mg total) by mouth once daily., Disp: 90 tablet, Rfl: 1   digoxin (LANOXIN) 0.125 MG tablet, Take 0.5 tablets (0.0625 mg total) by mouth daily., Disp: 30 tablet, Rfl: 3   sacubitril-valsartan (ENTRESTO) 49-51 MG, Take 1 tablet by mouth 2 (two) times daily., Disp: 180 tablet, Rfl: 3   Semaglutide,0.25 or 0.5MG /DOS, (OZEMPIC, 0.25 OR 0.5 MG/DOSE,) 2 MG/3ML SOPN, Inject 0.5 mg into the skin once a week., Disp: 3 mL,  Rfl: 1   torsemide (DEMADEX) 20 MG tablet, Take 20 mg by mouth 2 (two) times daily., Disp: , Rfl:    Allergies  Allergen Reactions   No [employer] Food Allergies Rash and Other (See Comments)    Lemons     Social History   Tobacco Use   Smoking status: Former    Current packs/day: 0.00    Types: Cigarettes    Quit date: 2022    Years since quitting: 2.8   Smokeless tobacco: Never  Vaping Use   Vaping status: Never Used  Substance Use Topics   Alcohol use: Not Currently    Comment: ~ 4 shots/day on weekends only   Drug use: Not Currently    Types: Cocaine    Comment: Admits to using cocaine up and will February 2024.       Chart Review Today: I personally reviewed active problem list, medication list, allergies, family history, social history, health maintenance, notes from last encounter, lab results, imaging with the patient/caregiver today.   Review of Systems  Constitutional: Negative.  Negative for chills, diaphoresis, fatigue and fever.  HENT: Negative.    Eyes: Negative.   Respiratory: Negative.    Cardiovascular: Negative.   Gastrointestinal: Negative.   Endocrine: Negative.   Genitourinary: Negative.   Musculoskeletal:  Positive for joint swelling. Negative for myalgias.  Skin: Negative.  Negative for color change.  Allergic/Immunologic: Negative.   Neurological: Negative.   Hematological: Negative.   Psychiatric/Behavioral: Negative.    All other systems reviewed and are negative.      Objective:   Vitals:   03/02/23 1029  BP: (!) 142/88  Pulse: 79  Resp: 16  Temp: 97.9 F (36.6 C)  TempSrc: Oral  SpO2: 98%  Weight: 202 lb 11.2 oz (91.9 kg)  Height: 5\' 7"  (1.702 m)    Body mass index is 31.75 kg/m.  Physical Exam Vitals and nursing note reviewed.  Constitutional:      Appearance: She is well-developed.  HENT:     Head: Normocephalic and atraumatic.     Nose: Nose normal.  Eyes:     General:        Right eye: No discharge.        Left eye: No discharge.     Conjunctiva/sclera: Conjunctivae normal.  Neck:     Trachea: No tracheal deviation.  Cardiovascular:     Rate and Rhythm: Normal rate and regular rhythm.  Pulmonary:     Effort: Pulmonary effort is normal. No respiratory distress.     Breath sounds: No stridor.  Musculoskeletal:     Right knee: Swelling and bony tenderness present. No erythema or crepitus. Decreased range of motion. Tenderness present over the lateral joint line, LCL and patellar tendon.  Skin:    General: Skin is warm and dry.     Findings: No rash.  Neurological:     Mental Status: She is alert.     Motor: No abnormal muscle tone.  Weakness: antalgic gait.    Coordination: Coordination normal.     Gait: Gait abnormal.  Psychiatric:        Behavior: Behavior normal.      Results for orders placed or performed in visit on 02/17/23  POCT HgB A1C  Result Value Ref Range   Hemoglobin A1C 7.7 (A) 4.0 - 5.6 %   HbA1c POC (<> result, manual entry)     HbA1c, POC (prediabetic range)     HbA1c, POC (controlled diabetic range)  Assessment & Plan:     ICD-10-CM   1. Acute pain of right knee  M25.561 Ambulatory referral to Orthopedic Surgery    Right knee pain x 2 months, no specific injury, recurrent swelling and pain, today pain is 2 tibial tuberosity and lateral right knee over LCL, with edema, no erythema, good ROM but she reports pain to actively flex knee, she could dangle knees from exam table and in chair to 90 degrees, and she could fully extend.   Encouraged her to get f/up with knee specialists asap for further assessment. She recently had 2 neg knee xrays - no further imaging indicated today She was given contact info for emergortho Steroids sent in - with cardiac meds and renal function avoid NSAIDs      Danelle Berry, PA-C 03/02/23 10:40 AM

## 2023-03-04 NOTE — Telephone Encounter (Signed)
Copied from CRM [id number]. Topic: General - Other >> Mar 04, 2023  7:57 AM Everette C wrote: Reason for CRM: The patient has called to request a letter of documentation stating that they are unable to work and will be filing for disability assistance   The patient is in need of the document for their assistance   The patient shares that they need the document by 03/04/23  Please contact the patient further when possible

## 2023-03-04 NOTE — Telephone Encounter (Signed)
Pt verbalized understanding.

## 2023-03-04 NOTE — Progress Notes (Unsigned)
Cardiology Office Note    Date:  03/10/2023   ID:  Molly Howard, DOB 1985-07-13, MRN [id number]  PCP:  Margarita Mail, DO  Cardiologist:  Debbe Odea, MD  Electrophysiologist:  Lanier Prude, MD   Chief Complaint: Follow up  History of Present Illness:   Molly Howard is a 37 y.o. 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    Her cardiomyopathy dates back to 2021 with echo at that time showing an EF of 35 to 40%.  Cardiac MRI in 05/2020 showed an EF of 42%, mild LV dilatation, RVEF 50%, and no delayed enhancement.  Echo from 07/2022 showed an EF of 25 to 30%, mildly decreased RV systolic function, PASP 50 mmHg, moderate to severe tricuspid regurgitation, and mild mitral regurgitation.  RHC in 07/2022 showed elevated right greater than left heart filling pressures, pulmonary venous hypertension, and moderately reduced cardiac output.  She has required ongoing hospital admissions for volume overload requiring aggressive diuresis and augmentation with milrinone.  Following discharge in 07/2022, she never followed up with cardiology.  She was most recently admitted to the hospital in 11/2022 for volume overload after running out of all of her medications.  She continued to use cocaine regularly and was cocaine positive that admission.  She was followed by the advanced heart failure service during the admission with symptomatic improvement following aggressive diuresis, net -36 L for the admission.     She was seen in hospital follow-up on 12/29/2022 and reported adherence to cardiac medications.  She denies smoking tobacco or using cocaine since hospital discharge.  She was drinking 2-3 beers nightly.  When compared to her visit in our  office in 01/2022, her weight was down 54 pounds.  She reported intermittent palpitations.  Entresto was titrated to 49/51 mg twice daily with continuation of low, Farxiga, torsemide, and digoxin.  Zio patch showed a predominant rhythm of sinus with an average rate of 76 bpm with 24 episodes of NSVT lasting up to 13 beats and 1 episode of SVT lasting 6 beats.  She was last seen in the office on 02/04/2023 and remained without symptoms of angina or cardiac decompensation.  Her weight was down another 7 pounds when compared to her visit in 12/2022.  She reported having been out of torsemide for approximately 2 weeks and atorvastatin for 2 to 3 days.  She reported adherence to remaining cardiac medications.  She was adding salt to food and eating out at restaurants/takeout food occasionally.  She continued to drink 2-3 beers nightly and would drink greater than 2 L of liquid on top of this.  She reported she had not smoked tobacco or used cocaine since leading up to her admission in 11/2022.  She did continue to note intermittent palpitations.  Carvedilol was titrated to 6.25 mg twice daily with continuation of Entresto, Farxiga, and digoxin.  She was restarted on torsemide 40 mg daily.  She comes in doing recently well from a cardiac perspective.  She is without symptoms of angina or cardiac decompensation.  She does note some intermittent shortness of breath at times as well as some intermittent lower extremity swelling and abdominal distention.  Stable two-pillow orthopnea.  Occasional early satiety.  No dizziness, presyncope, or syncope.  Weight at times will  increase as much as 5 pounds overnight and then decrease over the next day or 2.  Her weight is up approximately 5 pounds today when compared to her clinic visit in 02/2023.  Reports adherence to cardiac medications when reviewed individually.  Does not add salt to food.  Drinks two 40-ounce beers per day along with remaining fluids that she is unable to  quantify at this time.  Has started smoking again due to increased stress.  Reports that she has not used cocaine since 11/2022.  Reports elevated BP readings at home, numbers unclear.  Has food insecurities.   Labs independently reviewed: 02/2023 - A1c 7.7, digoxin 0.6, BUN 16, serum creatinine 1.16, potassium 3.8 01/2023 - magnesium 1.9 11/2022 - Hgb 10.2, PLT 313, free T4 normal, albumin 1.8, AST/ALT normal, urine drug screen positive for cocaine, A1c 11.7 05/2022 - TC 216, TG 124, HDL 46, LDL 145  Past Medical History:  Diagnosis Date   Acid reflux    Chronic HFrEF (heart failure with reduced ejection fraction) (HCC)    a. 10/2019 Echo: EF 35-40%, GrII DD; b. 05/2020 Echo: EF 20-25%, glob HK; c. 10/2020 Echo: EF 30-35%, glob HK. GrII DD, Mildly red RV fxn. Mod TR; d.  05/2021 cMRI: EF 42%, no LGE. Nl RV size/fxn.   CKD (chronic kidney disease), stage II    Diabetes mellitus (HCC)    H/O medication noncompliance    Hypertension    Microcytic anemia    NICM (nonischemic cardiomyopathy) (HCC)    a. 10/2019 Echo: EF 35-40%; b. 10/2019 MV: No ischemia. Small apical defect-->breast attenuation; c. 05/2020 Echo: EF 20-25%; d. 10/2020 Echo: EF 30-35%, glob HK. GrII DD, Mildly red RV fxn. Mod TR; e. 05/2021 cMRI: EF 42%, no LGE. Nl RV size/fxn.   Obesity    Polysubstance abuse [facility])     Past Surgical History:  Procedure Laterality Date   CHOLECYSTECTOMY     HERNIA REPAIR     RIGHT HEART CATH N/A 07/28/2022   Procedure: RIGHT HEART CATH;  Surgeon: Iran Ouch, MD;  Location: [facility] INVASIVE CV LAB;  Service: Cardiovascular;  Laterality: N/A;    Current Medications: Current Meds  Medication Sig   Accu-Chek Softclix Lancets lancets Use as instructed   atorvastatin (LIPITOR) 20 MG tablet Take 1 tablet (20 mg total) by mouth daily.   carvedilol (COREG) 6.25 MG tablet Take 1 tablet (6.25 mg total) by mouth 2 (two) times daily with a meal.   Continuous Glucose Sensor (FREESTYLE LIBRE 3 SENSOR) MISC  Place 1 sensor on the skin every 14 days. Use to check glucose continuously   dapagliflozin propanediol (FARXIGA) 10 MG TABS tablet Take 1 tablet (10 mg total) by mouth once daily.   digoxin (LANOXIN) 0.125 MG tablet Take 0.5 tablets (0.0625 mg total) by mouth daily.   sacubitril-valsartan (ENTRESTO) 97-103 MG Take 1 tablet by mouth 2 (two) times daily.   Semaglutide,0.25 or 0.5MG /DOS, (OZEMPIC, 0.25 OR 0.5 MG/DOSE,) 2 MG/3ML SOPN Inject 0.5 mg into the skin once a week.   torsemide (DEMADEX) 20 MG tablet Take 20 mg by mouth 2 (two) times daily.   [DISCONTINUED] sacubitril-valsartan (ENTRESTO) 49-51 MG Take 1 tablet by mouth 2 (two) times daily.    Allergies:   No healthtouch food allergies   Social History   Socioeconomic History   Marital status: Single    Spouse name: Not on file   Number of children: Not on file   Years of education: Not on file  Highest education level: Not on file  Occupational History   Not on file  Tobacco Use   Smoking status: Former    Current packs/day: 0.00    Types: Cigarettes    Quit date: 2022    Years since quitting: 2.8   Smokeless tobacco: Never  Vaping Use   Vaping status: Never Used  Substance and Sexual Activity   Alcohol use: Not Currently    Comment: ~ 4 shots/day on weekends only   Drug use: Not Currently    Types: Cocaine    Comment: Admits to using cocaine up and will February 2024.   Sexual activity: Not Currently    Birth control/protection: None  Other Topics Concern   Not on file  Social History Narrative   Lives locally.  Does not routinely exercise.  Has been using cocaine.   Social Determinants of Health   Financial Resource Strain: High Risk (06/16/2020)   Overall Financial Resource Strain (CARDIA)    Difficulty of Paying Living Expenses: Hard  Food Insecurity: Food Insecurity Present (11/24/2022)   Hunger Vital Sign    Worried About Running Out of Food in the Last Year: Sometimes true    Ran Out of Food in the Last  Year: Sometimes true  Transportation Needs: Unmet Transportation Needs (11/24/2022)   PRAPARE - Administrator, Civil Service (Medical): Yes    Lack of Transportation (Non-Medical): Yes  Physical Activity: Not on file  Stress: Not on file  Social Connections: Not on file     Family History:  The patient's family history includes Diabetes in her father and mother; Heart failure in her mother; Hypertension in her father.  ROS:   12-point review of systems is negative unless otherwise noted in the HPI.   EKGs/Labs/Other Studies Reviewed:    Studies reviewed were summarized above. The additional studies were reviewed today:  Zio patch 12/2022: Patient had a min HR of 62 bpm, max HR of 187 bpm, and avg HR of 76 bpm. Predominant underlying rhythm was Sinus Rhythm. 24 Ventricular Tachycardia runs occurred, the run with the fastest interval lasting 7 beats with a max rate of 187 bpm, the longest  lasting 13 beats with an avg rate of 128 bpm. 1 run of Supraventricular Tachycardia occurred lasting 6 beats with a max rate of 135 bpm (avg 116 bpm). Isolated SVEs were rare (<1.0%), SVE Couplets were rare (<1.0%), and SVE Triplets were rare (<1.0%).  Isolated VEs were rare (<1.0%, 1371), VE Couplets were rare (<1.0%, 120), and VE Triplets were rare (<1.0%, 26). Ventricular Bigeminy was present.    Conclusion Occasional nonsustained VT. 1 episode of paroxysmal SVT. No sustained arrhythmias. No atrial fibrillation or atrial flutter. __________   RHC 07/28/2022: Successful right heart catheterization via the right antecubital vein. Severely elevated right and left-sided filling pressures, moderate pulmonary hypertension and moderately reduced cardiac output.   RA: 30 mmHg RV: 56/14 mmHg PW: 34 mmHg PA: 54/30 with a mean of 41 mmHg PA sat is 42% Cardiac output: 4.47 with a cardiac index of 2. CPO is 0.89 PAPI: 0.8   Recommendations: The patient is severely volume overloaded with  evidence of biventricular failure right more than left by hemodynamics. Recommend starting milrinone drip and furosemide drip.  Will transfer to stepdown unit. __________   2D echo 07/25/2022: 1. Left ventricular ejection fraction, by estimation, is 25 to 30%. Left  ventricular ejection fraction by 2D MOD biplane is 21.6 %. The left  ventricle has severely  decreased function. The left ventricle demonstrates  global hypokinesis. Left ventricular  diastolic parameters are indeterminate.   2. Right ventricular systolic function is mildly reduced. The right  ventricular size is normal. There is moderately elevated pulmonary artery  systolic pressure. The estimated right ventricular systolic pressure is  50.7 mmHg.   3. Left atrial size was mildly dilated.   4. A small pericardial effusion is present. The pericardial effusion is  circumferential. There is no evidence of cardiac tamponade.   5. The aortic valve is tricuspid. Aortic valve regurgitation is not  visualized. No aortic stenosis is present.   6. The mitral valve is normal in structure. Mild mitral valve  regurgitation. No evidence of mitral stenosis.   7. Tricuspid valve regurgitation is moderate to severe.  __________   Limited echo 09/18/2021: 1. Left ventricular ejection fraction, by estimation, is 25 to 30%. Left  ventricular ejection fraction by 3D volume is 28 %. The left ventricle has  severely decreased function. The left ventricle demonstrates global  hypokinesis. Left ventricular  diastolic parameters are consistent with Grade II diastolic dysfunction  (pseudonormalization). The average left ventricular global longitudinal  strain is -5.9 %. The global longitudinal strain is abnormal.   2. Right ventricular systolic function is mildly reduced. The right  ventricular size is mildly enlarged. Tricuspid regurgitation signal is  inadequate for assessing PA pressure.   3. The mitral valve is normal in structure. No evidence  of mitral valve  regurgitation. No evidence of mitral stenosis.   4. The aortic valve is tricuspid. Aortic valve regurgitation is not  visualized. No aortic stenosis is present.   5. The inferior vena cava is normal in size with greater than 50%  respiratory variability, suggesting right atrial pressure of 3 mmHg.  __________   2D echo 06/30/2021: 1. Left ventricular ejection fraction, by estimation, is 30 to 35%. The  left ventricle has moderately decreased function. The left ventricle  demonstrates global hypokinesis. The left ventricular internal cavity size  was mildly dilated. Left ventricular  diastolic parameters are consistent with Grade II diastolic dysfunction  (pseudonormalization).   2. Right ventricular systolic function is normal. The right ventricular  size is normal. Tricuspid regurgitation signal is inadequate for assessing  PA pressure.   3. The mitral valve is normal in structure. Mild mitral valve  regurgitation. No evidence of mitral stenosis.   4. The aortic valve is tricuspid. Aortic valve regurgitation is not  visualized. No aortic stenosis is present.   5. The inferior vena cava is normal in size with greater than 50%  respiratory variability, suggesting right atrial pressure of 3 mmHg.  __________   Cardiac MRI 05/19/2021: IMPRESSION: 1. Mild LV dilatation, mild hypertrophy, and mild systolic dysfunction (EF 42%)  2.  No late gadolinium enhancement to suggest myocardial scar 3.  Normal RV size and systolic function (EF 50%) ___________   2D echo 03/20/2021: 1. Left ventricular ejection fraction, by estimation, is 25 to 30%. The  left ventricle has severely decreased function. The left ventricle  demonstrates global hypokinesis. Left ventricular diastolic parameters are  consistent with Grade II diastolic  dysfunction (pseudonormalization). The average left ventricular global  longitudinal strain is -9.2 %. The global longitudinal strain is abnormal.    2. Right ventricular systolic function is mildly reduced. The right  ventricular size is mildly enlarged. There is severely elevated pulmonary  artery systolic pressure. The estimated right ventricular systolic  pressure is 70.8 mmHg.   3. Left atrial size  was mildly dilated.   4. The mitral valve is normal in structure. Mild mitral valve  regurgitation. No evidence of mitral stenosis.   5. Tricuspid valve regurgitation is mild to moderate.   6. The inferior vena cava is dilated in size with <50% respiratory  variability, suggesting right atrial pressure of 15 mmHg.  __________   2D echo 10/11/2020: 1. Left ventricular ejection fraction, by estimation, is 30 to 35%. The  left ventricle has moderate to severely decreased function. The left  ventricle demonstrates global hypokinesis. Left ventricular diastolic  parameters are consistent with Grade II  diastolic dysfunction (pseudonormalization).   2. Right ventricular systolic function is mildly reduced. The right  ventricular size is normal.   3. A small pericardial effusion is present.   4. The mitral valve is normal in structure. No evidence of mitral valve  regurgitation.   5. Tricuspid valve regurgitation is moderate.   6. The aortic valve is tricuspid. Aortic valve regurgitation is not  visualized.  __________   2D echo 05/20/2020: 1. Left ventricular ejection fraction, by estimation, is 20 to 25%. The  left ventricle has severely decreased function. The left ventricle  demonstrates global hypokinesis. The left ventricular internal cavity size  was mildly dilated. Indeterminate  diastolic filling due to E-A fusion.   2. Right ventricular systolic function is normal. The right ventricular  size is mildly enlarged. There is severely elevated pulmonary artery  systolic pressure.   3. The mitral valve is normal in structure. Trivial mitral valve  regurgitation. No evidence of mitral stenosis.   4. Tricuspid valve regurgitation is  moderate.   5. The aortic valve was not well visualized. Aortic valve regurgitation  is not visualized. No aortic stenosis is present.   6. The inferior vena cava is dilated in size with <50% respiratory  variability, suggesting right atrial pressure of 15 mmHg.  __________   Eugenie Birks MPI 10/05/2019: There was no ST segment deviation noted during stress. T wave inversion was noted during stress in the I and aVL leads. Defect 1: There is a small defect of mild severity present in the apex location. This is likely due to breast attenuation artifact Nuclear stress EF: 30%. This is a high risk study due to low EF. Low EF seems to be due to nonischemic cardiomyopathy. __________   2D echo 10/03/2019: 1. Left ventricular ejection fraction, by estimation, is 35 to 40%. The  left ventricle has moderately decreased function. The left ventricle  demonstrates global hypokinesis. Left ventricular diastolic parameters are  consistent with Grade II diastolic  dysfunction (pseudonormalization).   2. Right ventricular systolic function is normal. The right ventricular  size is normal.   3. The mitral valve is normal in structure. No evidence of mitral valve  regurgitation. No evidence of mitral stenosis.   4. The aortic valve is normal in structure. Aortic valve regurgitation is  not visualized. No aortic stenosis is present.   5. The inferior vena cava is normal in size with greater than 50%  respiratory variability, suggesting right atrial pressure of 3 mmHg.    EKG:  EKG is not ordered today.    Recent Labs: 11/05/2022: B Natriuretic Peptide 1,267.5 11/06/2022: ALT 11 01/10/2023: Hemoglobin 10.9; Magnesium 1.9; Platelets 262 02/04/2023: BUN 16; Creatinine, Ser 1.16; Potassium 3.8; Sodium 141  Recent Lipid Panel    Component Value Date/Time   CHOL 216 (H) 05/27/2022 1022   TRIG 124 05/27/2022 1022   HDL 46 05/27/2022 1022   CHOLHDL  4.7 05/27/2022 1022   VLDL 25 05/27/2022 1022   LDLCALC 145 (H)  05/27/2022 1022   LDLCALC 171 (H) 06/12/2021 1354    PHYSICAL EXAM:    VS:  BP 130/82 (BP Location: Left Arm, Patient Position: Sitting, Cuff Size: Normal)   Pulse 84   Ht 5\' 7"  (1.702 m)   Wt 207 lb (93.9 kg)   SpO2 97%   BMI 32.42 kg/m   BMI: Body mass index is 32.42 kg/m.  Physical Exam Vitals reviewed.  Constitutional:      Appearance: She is well-developed.  HENT:     Head: Normocephalic and atraumatic.  Eyes:     General:        Right eye: No discharge.        Left eye: No discharge.  Neck:     Vascular: JVD present.     Comments: JVD mildly elevated. Cardiovascular:     Rate and Rhythm: Normal rate and regular rhythm.     Pulses:          Posterior tibial pulses are 2+ on the right side and 2+ on the left side.     Heart sounds: S1 normal and S2 normal. Heart sounds not distant. No midsystolic click and no opening snap. Murmur heard.     Systolic murmur is present with a grade of 1/6 at the upper left sternal border.     No friction rub.  Pulmonary:     Effort: Pulmonary effort is normal. No respiratory distress.     Breath sounds: Normal breath sounds. No decreased breath sounds, wheezing, rhonchi or rales.  Chest:     Chest wall: No tenderness.  Abdominal:     General: There is distension.     Palpations: Abdomen is soft.     Tenderness: There is no abdominal tenderness.  Musculoskeletal:     Cervical back: Normal range of motion.     Right lower leg: No edema.     Left lower leg: No edema.     Comments: Right knee brace noted.  Skin:    General: Skin is warm and dry.     Nails: There is no clubbing.  Neurological:     Mental Status: She is alert and oriented to person, place, and time.  Psychiatric:        Speech: Speech normal.        Behavior: Behavior normal.        Thought Content: Thought content normal.        Judgment: Judgment normal.     Wt Readings from Last 3 Encounters:  03/10/23 207 lb (93.9 kg)  03/02/23 202 lb 11.2 oz (91.9 kg)   02/17/23 205 lb 11.2 oz (93.3 kg)     ASSESSMENT & PLAN:   Chronic combined systolic and diastolic CHF with BiV failure: Appears mildly volume up today.  This is likely in the setting of excessive fluid intake.  Her cardiomyopathy has been felt to be nonischemic etiology in the context of uncontrolled hypertension, cocaine abuse, and written medical compliance.  No delayed enhancement on cardiac MRI.  She has been evaluated by the advanced heart failure service and is not deemed to be a candidate for advanced therapies due to persistently positive tox screens for cocaine and medical nonadherence.  However, she reports no cocaine use since 11/2022 and adherence to pharmacotherapy.  She is scheduled to be evaluated by EP next week for reconsideration of ICD as this was previously canceled in 10/2021  due to anemia.  Titrate Entresto to 97/103 mg twice daily.  Increase torsemide to 20 mg 3 times daily for 3 days followed by resumption of 20 mg twice daily thereafter.  She will continue carvedilol 6.25 mg twice daily, digoxin 0.125 mg daily, and Farxiga 10 mg.  Historically, she has not been maintained on MRA in the context of medical nonadherence and underlying renal dysfunction.  Continue to follow-up with the heart failure clinic.  CHF education.  Future orders placed for BMP and digoxin to be drawn when she is in the office next week.  Moderate to severe tricuspid regurgitation: Previously not felt to be a candidate for invasive procedure given persistently positive urine toxicology screens.  Medical therapy as outlined above.  Nonobstructive CAD: No symptoms of angina or cardiac decompensation.  Now back on atorvastatin 20 mg.  Palpitations/NSVT/PSVT: Quiescent.  Continue carvedilol 6.25 mg twice daily.  HTN: Blood pressure is reasonably controlled in the office today.  Pharmacotherapy as outlined above.  Cocaine, tobacco, and ongoing alcohol use: Reports abstaining from cocaine since 11/2022.   Continues to drink two 40-ounce beers daily.  Has restarted smoking.  Complete cessation and abstinence of polysubstance use is encouraged.  Iron deficiency anemia: Hemoglobin stable on most recent check.  Follow-up with PCP.  She denies symptoms concerning for bleeding.  Uncontrolled DM2: A1c 11.7.  Ongoing management per PCP.  CKD stage IIIa: Future orders for BMP placed.  To be drawn next week.  Food insecurities: Refer to social work.     Disposition: F/u with Dr. Azucena Cecil or an APP in 1 month, and EP as scheduled next week.    Medication Adjustments/Labs and Tests Ordered: Current medicines are reviewed at length with the patient today.  Concerns regarding medicines are outlined above. Medication changes, Labs and Tests ordered today are summarized above and listed in the Patient Instructions accessible in Encounters.   Signed, Eula Listen, PA-C 03/10/2023 2:33 PM     [facility] [address] [place], [place] [address] [phone]

## 2023-03-07 ENCOUNTER — Ambulatory Visit (HOSPITAL_COMMUNITY): Payer: Medicaid Other | Attending: Internal Medicine

## 2023-03-07 ENCOUNTER — Other Ambulatory Visit: Payer: Self-pay

## 2023-03-07 DIAGNOSIS — G8929 Other chronic pain: Secondary | ICD-10-CM | POA: Insufficient documentation

## 2023-03-07 DIAGNOSIS — R2689 Other abnormalities of gait and mobility: Secondary | ICD-10-CM | POA: Insufficient documentation

## 2023-03-07 DIAGNOSIS — M25561 Pain in right knee: Secondary | ICD-10-CM | POA: Diagnosis present

## 2023-03-07 DIAGNOSIS — R269 Unspecified abnormalities of gait and mobility: Secondary | ICD-10-CM | POA: Insufficient documentation

## 2023-03-07 NOTE — Therapy (Cosign Needed Addendum)
OUTPATIENT PHYSICAL THERAPY LOWER EXTREMITY EVALUATION   Patient Name: Molly Howard MRN: [id number] DOB:07/19/1985, 37 y.o., female Today's Date: 03/08/2023  END OF SESSION:  PT End of Session - 03/07/23 1150     Visit Number 1    Number of Visits 8    Date for PT Re-Evaluation 04/04/23    Authorization Type [place] Medicaid Prepaid    PT Start Time 1151    PT Stop Time 1230    PT Time Calculation (min) 39 min    Activity Tolerance Patient tolerated treatment well    Behavior During Therapy WFL for tasks assessed/performed             Past Medical History:  Diagnosis Date   Acid reflux    Chronic HFrEF (heart failure with reduced ejection fraction) (HCC)    a. 10/2019 Echo: EF 35-40%, GrII DD; b. 05/2020 Echo: EF 20-25%, glob HK; c. 10/2020 Echo: EF 30-35%, glob HK. GrII DD, Mildly red RV fxn. Mod TR; d.  05/2021 cMRI: EF 42%, no LGE. Nl RV size/fxn.   CKD (chronic kidney disease), stage II    Diabetes mellitus (HCC)    H/O medication noncompliance    Hypertension    Microcytic anemia    NICM (nonischemic cardiomyopathy) (HCC)    a. 10/2019 Echo: EF 35-40%; b. 10/2019 MV: No ischemia. Small apical defect-->breast attenuation; c. 05/2020 Echo: EF 20-25%; d. 10/2020 Echo: EF 30-35%, glob HK. GrII DD, Mildly red RV fxn. Mod TR; e. 05/2021 cMRI: EF 42%, no LGE. Nl RV size/fxn.   Obesity    Polysubstance abuse [facility])    Past Surgical History:  Procedure Laterality Date   CHOLECYSTECTOMY     HERNIA REPAIR     RIGHT HEART CATH N/A 07/28/2022   Procedure: RIGHT HEART CATH;  Surgeon: Iran Ouch, MD;  Location: [facility] INVASIVE CV LAB;  Service: Cardiovascular;  Laterality: N/A;   Patient Active Problem List   Diagnosis Date Noted   Cardiogenic shock (HCC) 11/14/2022   Bilateral leg edema 11/06/2022   NICM (nonischemic cardiomyopathy) (HCC) 07/31/2022   Hypocalcemia 07/25/2022   Leg edema 07/25/2022   Acute on chronic combined systolic and diastolic CHF (congestive heart failure)  (HCC) 07/25/2022   GERD without esophagitis 07/24/2022   Cocaine abuse (HCC) 02/12/2022   Acute pulmonary edema (HCC)    Syncope 02/09/2022   Menorrhagia with regular cycle 06/22/2021   Dyslipidemia 11/27/2020   Chronic kidney disease (CKD), stage II (mild) 11/27/2020   Hypomagnesemia 10/17/2020   Dyspnea 05/21/2020   Essential hypertension 05/20/2020   Diabetes mellitus, type II (HCC) 05/20/2020   Chronic anemia 05/20/2020   Reactive thrombocytosis 05/20/2020   Obesity (BMI 30-39.9) 05/20/2020   Chronic combined systolic and diastolic heart failure (HCC) 11/01/2019   Financial difficulties 11/01/2019   Medication management 11/01/2019   Lung nodule 11/01/2019   Nonischemic cardiomyopathy (HCC) 11/01/2019   Polysubstance abuse (HCC) 11/01/2019   Tobacco use 11/01/2019   Swelling     PCP: Margarita Mail, DO  REFERRING PROVIDER: Margarita Mail, DO  REFERRING DIAG:  R26.9 (ICD-10-CM) - Gait abnormality M25.561 (ICD-10-CM) - Acute pain of right knee    THERAPY DIAG:  Other abnormalities of gait and mobility  Rationale for Evaluation and Treatment: Rehabilitation  ONSET DATE: 11/05/2022  SUBJECTIVE:   SUBJECTIVE STATEMENT: Presents to the clinic wearing a brace on right knee that was given during a recent doctors visit on 03/04/2023. Does not wear the brace all the time. Notices some help  with it but not much. Patient also received injection into right knee (unable to confirm if it was a steroid injection or not). Patient states that her balance problems started after she left a hospital in early July of 2024 when she was admitted for fluid problems, systolic and diastolic heart failure. Patient states they also found an old blood clot in her left hemisphere. Reports potentially needing a pacemaker in near future; to be discussed at upcoming cardiology appointment. Patient is currently unemployed trying to get social security. Knee pain exaggerated when in any bent position  or prolonged standing. Notices that she staggers backward when walking on flat surface. Can only go up steps if there is a railing; limited by pain. Has noticed some swelling and sharp pain. Pain is slightly worse in the morning. Patient denies any regular physical exercise or physical leisure activity.    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  PAIN:  Are you having pain? Yes: NPRS scale: 7/10 Pain location: right knee joint inferior lateral  Pain description: Sharp  Aggravating factors: stairs, walking, after prolonged sitting  Relieving factors: heating pad helps some  PRECAUTIONS: Other: Heart problems; potentially getting a pacemaker. "My heart is too big"  RED FLAGS: None   WEIGHT BEARING RESTRICTIONS: No  FALLS:  Has patient fallen in last 6 months? Yes. Number of falls 3  LIVING ENVIRONMENT: Lives in: Mobile home Stairs: Yes: External: 7 steps; can reach both  Has following equipment at home: None  OCCUPATION: unemployed   PLOF: Independent with basic ADLs  PATIENT GOALS: Walk with less pain and no falls.  NEXT MD VISIT: 03/10/23  OBJECTIVE:  Note: Objective measures were completed at Evaluation unless otherwise noted.  DIAGNOSTIC FINDINGS: 01/16/23 x-ray unremarkable; no acute abnormality noted  PATIENT SURVEYS:  LEFS 25/80 (31.3)  COGNITION: Overall cognitive status: Within functional limits for tasks assessed     SENSATION: Not tested   POSTURE: rounded shoulders, forward head, and increased thoracic kyphosis  PALPATION: Painful over anterior, inferior lateral knee, even when palpating through jeans and knee brace  LOWER EXTREMITY ROM:  Active ROM Right eval Left eval  Hip flexion    Hip extension    Hip abduction    Hip adduction    Hip internal rotation    Hip external rotation    Knee flexion 65 125  Knee extension -5 0   Ankle dorsiflexion    Ankle plantarflexion    Ankle inversion    Ankle eversion     (Blank rows = not tested)  LOWER EXTREMITY MMT:  MMT Right eval Left eval  Hip flexion    Hip extension    Hip abduction    Hip adduction    Hip internal rotation    Hip external rotation    Knee flexion 3- 4+  Knee extension 3+ 4+  Ankle dorsiflexion    Ankle plantarflexion    Ankle inversion    Ankle eversion     (Blank rows = not tested)  LOWER EXTREMITY SPECIAL TESTS:  Unable to perform due to patient's clothing   FUNCTIONAL TESTS:  30 seconds chair stand test 3.5 reps with marked forward trunk lean during rising and sitting.  SLS: Right: 2 sec, painful   Left: 5 sec  GAIT: Distance walked: 50 feet Assistive device utilized: None Level of assistance: SBA Comments: Externally rotated/ toe-out bilaterally, decreased stance time on  right LE, decreased stride length bilaterally (R>L).    TODAY'S TREATMENT:                                                                                                                              DATE: 03/07/2023    PATIENT EDUCATION:  Education details: pain dominant presentation  Person educated: Patient Education method: Explanation and Handouts Education comprehension: verbalized understanding and returned demonstration  HOME EXERCISE PROGRAM: Medbridge code: [id number]  ASSESSMENT:  CLINICAL IMPRESSION: Patient is a 37 y.o. female who was seen today for physical therapy evaluation and treatment for gait disturbances and right knee pain. Patient's cardiac involvement, knee pain, and RLE weakness limits her daily activity. Antalgic and labored gait. PT was unable to accurately assess origin of right knee pain due to patient presenting to the clinic in jeans with a knee brace on underneath without a change of clothes present. Currently patient is pain dominant during almost every motion; especially squatting and stair climbing. Falls have been  related to stairs and transfers. RLE weakness and hx of left hemispheric stroke likely contributing to poor balance and gait control. Hx of gout could contribute pain dominance. Patient reports imaging shows evidence of some OA, but notes from x-ray on 9/15 were unremarkable.   OBJECTIVE IMPAIRMENTS: Abnormal gait, cardiopulmonary status limiting activity, decreased activity tolerance, decreased balance, decreased mobility, difficulty walking, decreased ROM, decreased strength, improper body mechanics, and pain.   ACTIVITY LIMITATIONS: carrying, lifting, bending, sitting, standing, squatting, stairs, transfers, and dressing  PARTICIPATION LIMITATIONS: meal prep, cleaning, and driving  PERSONAL FACTORS: Fitness and Social background are also affecting patient's functional outcome.   REHAB POTENTIAL: Good  CLINICAL DECISION MAKING: Stable/uncomplicated  EVALUATION COMPLEXITY: Moderate   GOALS: Goals reviewed with patient? No  SHORT TERM GOALS: Target date: 03/21/2023 Patient will be independent and compliant with initial HEP. Baseline: Goal status: INITIAL  2.  By reevaluation patient will be to complete >=5 squats during the 30 sec chair to stand test.  Baseline: 3 Goal status: INITIAL   LONG TERM GOALS: Target date: 04/04/2023  By discharge patient will have 4+/5 MMT for right knee EXT and FLEX in order to navigate stairs with minimal railing assistance.  Baseline:  Goal status: INITIAL  2.  By discharge patient will achieve an LEFS score of >40/80 in order to increase perceived function. Baseline: 25/80 Goal status: INITIAL  3.  By discharge patient will be able to walk for 15 minutes without pain greater than 4/10 in order to increase community participation.  Baseline:  Goal status: INITIAL   PLAN:  PT FREQUENCY: 2x/week  PT DURATION: 4 weeks  Depends on patient availability   PLANNED INTERVENTIONS: 97164- PT Re-evaluation, 97110-Therapeutic exercises, 97530-  Therapeutic activity, [id number]- Neuromuscular re-education, 97535- Self Care, 60454- Manual therapy, and 97116- Gait training  PLAN FOR NEXT SESSION: Review goals, HEP, and availability for PT. Continue physical exam of knee to assess ligamentous integrity and point  tenderness with knee exposed.   7:35 AM, 03/08/23 Karna Christmas, SPT   " I agree with the following treatment note after reviewing documentation. This session was performed under the supervision of a licensed clinician."     7:30 AM, 03/08/23 Amy Small Lynch MPT [facility] [place] [phone]

## 2023-03-08 NOTE — Addendum Note (Signed)
Addended byWilhemena Durie S on: 03/08/2023 07:39 AM   Modules accepted: Orders

## 2023-03-10 ENCOUNTER — Ambulatory Visit: Payer: Medicaid Other | Attending: Physician Assistant | Admitting: Physician Assistant

## 2023-03-10 ENCOUNTER — Encounter: Payer: Self-pay | Admitting: Physician Assistant

## 2023-03-10 ENCOUNTER — Other Ambulatory Visit: Payer: Self-pay

## 2023-03-10 VITALS — BP 130/82 | HR 84 | Ht 67.0 in | Wt 207.0 lb

## 2023-03-10 DIAGNOSIS — R002 Palpitations: Secondary | ICD-10-CM

## 2023-03-10 DIAGNOSIS — Z5941 Food insecurity: Secondary | ICD-10-CM

## 2023-03-10 DIAGNOSIS — Z794 Long term (current) use of insulin: Secondary | ICD-10-CM

## 2023-03-10 DIAGNOSIS — I5082 Biventricular heart failure: Secondary | ICD-10-CM

## 2023-03-10 DIAGNOSIS — N1831 Chronic kidney disease, stage 3a: Secondary | ICD-10-CM

## 2023-03-10 DIAGNOSIS — I428 Other cardiomyopathies: Secondary | ICD-10-CM | POA: Diagnosis not present

## 2023-03-10 DIAGNOSIS — I2510 Atherosclerotic heart disease of native coronary artery without angina pectoris: Secondary | ICD-10-CM

## 2023-03-10 DIAGNOSIS — F1910 Other psychoactive substance abuse, uncomplicated: Secondary | ICD-10-CM

## 2023-03-10 DIAGNOSIS — I10 Essential (primary) hypertension: Secondary | ICD-10-CM

## 2023-03-10 DIAGNOSIS — D509 Iron deficiency anemia, unspecified: Secondary | ICD-10-CM

## 2023-03-10 DIAGNOSIS — I5042 Chronic combined systolic (congestive) and diastolic (congestive) heart failure: Secondary | ICD-10-CM

## 2023-03-10 DIAGNOSIS — E119 Type 2 diabetes mellitus without complications: Secondary | ICD-10-CM

## 2023-03-10 DIAGNOSIS — I071 Rheumatic tricuspid insufficiency: Secondary | ICD-10-CM

## 2023-03-10 DIAGNOSIS — I4729 Other ventricular tachycardia: Secondary | ICD-10-CM

## 2023-03-10 MED ORDER — SACUBITRIL-VALSARTAN 97-103 MG PO TABS
1.0000 | ORAL_TABLET | Freq: Two times a day (BID) | ORAL | 3 refills | Status: DC
  Filled 2023-03-10: qty 180, 90d supply, fill #0

## 2023-03-10 NOTE — Patient Instructions (Addendum)
Medication Instructions:  Your physician has recommended you make the following change in your medication:  1) INCREASE Entresto to 97-103 mg twice daily  2) INCREASE torsemide to 20 mg three times per day for three days, then decrease back to 20 mg twice daily   *If you need a refill on your cardiac medications before your next appointment, please call your pharmacy*  Lab Work: 11/13: BMET and digoxin   Follow-Up: At [facility], you and your health needs are our priority.  As part of our continuing mission to provide you with exceptional heart care, we have created designated Provider Care Teams.  These Care Teams include your primary Cardiologist (physician) and Advanced Practice Providers (APPs -  Physician Assistants and Nurse Practitioners) who all work together to provide you with the care you need, when you need it.  Your next appointment:   1 month  Provider:   Eula Listen, PA-C    Other: You have been referred to our Social Workers

## 2023-03-11 ENCOUNTER — Telehealth (HOSPITAL_COMMUNITY): Payer: Self-pay | Admitting: Licensed Clinical Social Worker

## 2023-03-11 NOTE — Telephone Encounter (Signed)
CSW contacted patient to discuss referral for food insecurity. Patient states she needs a letter stating she can't work for food stamps. CSW reviewed care and recent notes with patient regarding her health status. CSW inquired about reports of alcohol use 2 40 oz cans a day as well as tobacco use  in which patient denied either as daily use only occasional. Patient states she is not currently working and has no income. CSW will reach out to MD to inquire about ability to work and return call to patient if letter can be completed by MD. CSW also suggested that in the meantime patient reach out to her PCP to ask for the letter as well. Patient verbalizes understanding and agreeable to plan. Lasandra Beech, LCSW, CCSW-MCS [phone]

## 2023-03-14 ENCOUNTER — Other Ambulatory Visit: Payer: Medicaid Other | Admitting: Pharmacist

## 2023-03-14 NOTE — Progress Notes (Unsigned)
03/14/2023 Name: Molly Howard MRN: [id number] DOB: July 21, 1985  Chief Complaint  Patient presents with   Medication Management   Medication Adherence    Molly Howard is a 37 y.o. year old female who presented for a telephone visit.   They were referred to the pharmacist by their PCP for assistance in managing medication access.      Subjective:   Care Team: Primary Care Provider: Margarita Mail, DO; Next Scheduled Visit: 02/17/2023 Social Worker: Shaune Leeks Nurse Care Manager: Heidi Dach, RN; Next Schedule Visit: 03/01/2023 Cardiologist: Donald Siva; Next Schedule Visit: 03/10/2023 Heart Failure Specialist: Delma Freeze, FNP; Next Schedule Visit: 03/24/2023 Electrophysiologist: Lanier Prude, MD; 03/16/2023  Medication Access/Adherence  Current Pharmacy:  [facility] [address] [place] [address] Phone: [phone] Fax: [phone]   Patient reports affordability concerns with their medications: Yes  Patient reports access/transportation concerns to their pharmacy: No  Patient reports adherence concerns with their medications:  No     Reports sometimes difficult to afford her Medicaid copayments from the pharmacy - Note have advised patient that she can ask pharmacy to bill her for copayment amount if she is unable to afford cost so that she can pick up her medications   Diabetes:   Current medications:  - Farxiga 10 mg daily - Ozempic 0.5 mg weekly   Medications tried in the past: Mounjaro (cost), Lantus   Current glucose readings: recalls last checked yesterday and reading in 200s, but does not recall specific reading   Reports picked up and started using Freestyle Libre 3 continuous glucose monitor (CGM), but reports has not changed sensor in >2 weeks. Also reports linked the CGM to her sister's phone, rather than her own as her phone was not in service a the time   Statin:  atorvastatin 20 mg daily  Patient denies recent symptoms of hypoglycemia  Heart Failure (EF ***%):  Current medications:  ACEi/ARB/ARNI: *** SGLT2i: *** Beta blocker: carvedilol  Mineralocorticoid Receptor Antagonist: *** Diuretic regimen: ***  Current home blood pressure readings: *** Current home weights: ***  Current physical activity: ***  Current medication access support: ***   Objective:  Lab Results  Component Value Date   HGBA1C 7.7 (A) 02/17/2023    Lab Results  Component Value Date   CREATININE 1.16 (H) 02/04/2023   BUN 16 02/04/2023   NA 141 02/04/2023   K 3.8 02/04/2023   CL 102 02/04/2023   CO2 25 02/04/2023    Lab Results  Component Value Date   CHOL 216 (H) 05/27/2022   HDL 46 05/27/2022   LDLCALC 145 (H) 05/27/2022   TRIG 124 05/27/2022   CHOLHDL 4.7 05/27/2022   BP Readings from Last 3 Encounters:  03/10/23 130/82  03/02/23 (!) 142/88  02/09/23 (!) 126/99   Pulse Readings from Last 3 Encounters:  03/10/23 84  03/02/23 79  02/17/23 79     Medications Reviewed Today     Reviewed by Manuela Neptune, RPH-CPP (Pharmacist) on 03/14/23 at 1133  Med List Status: <None>   Medication Order Taking? Sig Documenting Provider Last Dose Status Informant  Accu-Chek Softclix Lancets lancets [id number]  Use as instructed Molly Mail, DO  Active   atorvastatin (LIPITOR) 20 MG tablet [id number] Yes Take 1 tablet (20 mg total) by mouth daily. Sondra Barges, PA-C Taking Active   carvedilol (COREG) 6.25 MG tablet [id number] Yes Take 1 tablet (6.25 mg total) by  mouth 2 (two) times daily with a meal. Dunn, Raymon Mutton, PA-C Taking Active   Continuous Glucose Sensor (FREESTYLE LIBRE 3 SENSOR) [place] [id number]  Place 1 sensor on the skin every 14 days. Use to check glucose continuously Molly Mail, DO  Active   dapagliflozin propanediol (FARXIGA) 10 MG TABS tablet [id number] Yes Take 1 tablet (10 mg total) by mouth once daily. Molly Mail, DO  Taking Active   digoxin (LANOXIN) 0.125 MG tablet [id number]  Take 0.5 tablets (0.0625 mg total) by mouth daily. Sondra Barges, PA-C  Active     Discontinued 10/18/20 1034 (Reorder) sacubitril-valsartan (ENTRESTO) 97-103 MG [id number]  Take 1 tablet by mouth 2 (two) times daily. Sondra Barges, PA-C  Active   Semaglutide,0.25 or 0.5MG /DOS, (OZEMPIC, 0.25 OR 0.5 MG/DOSE,) 2 MG/3ML SOPN [id number] Yes Inject 0.5 mg into the skin once a week. Molly Mail, DO Taking Active   torsemide (DEMADEX) 20 MG tablet [id number] Yes Take 20 mg by mouth 2 (two) times daily. [provider] Taking Active   Med List Note Sharia Reeve, CPhT 11/27/20 3220): Patient states she has been out of her medications for "a while now". It is due to financial limitations.              Assessment/Plan:   Encourage patient to start using weekly pillbox  {Pharmacy A/P Choices:26421}  Follow Up Plan: ***  ***

## 2023-03-15 NOTE — Telephone Encounter (Signed)
CSW contacted patient to inform hat the requested letter for Food Stamps will be ready in the clinic for pick up. Message left. Lasandra Beech, LCSW, CCSW-MCS [phone]

## 2023-03-16 ENCOUNTER — Encounter: Payer: Self-pay | Admitting: Cardiology

## 2023-03-16 ENCOUNTER — Ambulatory Visit: Payer: Medicaid Other | Attending: Cardiology | Admitting: Cardiology

## 2023-03-16 VITALS — BP 134/96 | HR 85 | Ht 67.0 in | Wt 205.2 lb

## 2023-03-16 DIAGNOSIS — F1910 Other psychoactive substance abuse, uncomplicated: Secondary | ICD-10-CM | POA: Diagnosis not present

## 2023-03-16 DIAGNOSIS — I5042 Chronic combined systolic (congestive) and diastolic (congestive) heart failure: Secondary | ICD-10-CM | POA: Diagnosis not present

## 2023-03-16 DIAGNOSIS — I428 Other cardiomyopathies: Secondary | ICD-10-CM | POA: Diagnosis not present

## 2023-03-16 NOTE — Progress Notes (Signed)
Electrophysiology Office Follow up Visit Note:    Date:  03/16/2023   ID:  Molly Howard, DOB [birth year], MRN [id number]  PCP:  Margarita Mail, DO  [facility] Cardiologist:  Debbe Odea, MD  [facility] Electrophysiologist:  Lanier Prude, MD    Interval History:     Molly Howard is a 37 y.o. female who presents for a follow up visit.   Discussed the use of AI scribe software for clinical note transcription with the patient, who gave verbal consent to proceed.  History of Present Illness   Miss Molly Howard, a patient with non-ischemic cardiomyopathy and chronic systolic heart failure, presents for follow-up. Her ejection fraction (EF) has been fluctuating, with a recent echocardiogram in March showing an EF of 25-30%. She has been hospitalized multiple times in the past year for decompensated heart failure. A subcutaneous ICD was planned but has been postponed twice due to transient improvement in EF and anemia.  Recently, she has been experiencing changes in her heart rate, which she describes as going fast and then slowing down. This symptom has worsened recently, leading to an increase in her medication. She also reports fluid retention, particularly in her abdomen, and has to prop herself up when sleeping due to shortness of breath. She denies any episodes of syncope but mentions staggering when she walks, which occurs all the time. She has not been thinking about the defibrillator recently and denies any current cocaine use.            Past medical, surgical, social and family history were reviewed.  ROS:   Please see the history of present illness.    All other systems reviewed and are negative.  EKGs/Labs/Other Studies Reviewed:    The following studies were reviewed today:       Physical Exam:    VS:  BP (!) 134/96 (BP Location: Left Arm, Patient Position: Sitting, Cuff Size: Large)   Pulse 85   Ht 5\' 7"  (1.702 m)   Wt 205 lb 3.2 oz (93.1 kg)    SpO2 99%   BMI 32.14 kg/m     Wt Readings from Last 3 Encounters:  03/16/23 205 lb 3.2 oz (93.1 kg)  03/10/23 207 lb (93.9 kg)  03/02/23 202 lb 11.2 oz (91.9 kg)     Physical Exam   CHEST: lungs clear to auscultation CARDIOVASCULAR: regular rhythm, no murmurs EXTREMITIES: no edema in the lower extremities          ASSESSMENT:    1. Chronic combined systolic and diastolic heart failure (HCC)   2. Nonischemic cardiomyopathy (HCC)   3. Polysubstance abuse (HCC)    PLAN:    In order of problems listed above:  Assessment and Plan    Chronic Systolic Heart Failure EF 25-30% with mildly reduced right ventricular function. Multiple hospitalizations for decompensated heart failure in 2024. Patient reports palpitations, fluid retention in the abdomen, and shortness of breath requiring propping up at night. No syncope. -Plan for subcutaneous ICD implantation in the next few weeks under general anesthesia. Procedure discussed including the risks and recovery and she wishes to proceed.  -Continue current medications.  Anemia Previously led to cancellation of ICD implant, now improved.  Cocaine Use Patient reports abstinence. -Continue abstinence for cardiac health.       The patient has a nonischemic CM (EF 25%), NYHA Class III CHF, and CAD.  He is referred by Dr Azucena Cecil for risk stratification of sudden death and consideration of ICD  implantation.  At this time, she meets criteria for ICD implantation for primary prevention of sudden death.  I have had a thorough discussion with the patient reviewing options.  The patient and their family (if available) have had opportunities to ask questions and have them answered. The patient and I have decided together through a shared decision making process to proceed with ICD implant at this time.    Risks, benefits, alternatives to ICD implantation were discussed in detail with the patient today. The patient understands that the risks  include but are not limited to bleeding, infection, pneumothorax, perforation, tamponade, vascular damage, renal failure, MI, stroke, death, inappropriate shocks, and lead dislodgement and wishes to proceed.  We will therefore schedule device implantation at the next available time.  Plan for subcutaneous ICD.       Signed, Steffanie Dunn, MD, [facility], [facility] 03/16/2023 5:10 PM    Electrophysiology [facility]

## 2023-03-16 NOTE — Patient Instructions (Signed)
Medication Instructions:  Your physician recommends that you continue on your current medications as directed. Please refer to the Current Medication list given to you today.  *If you need a refill on your cardiac medications before your next appointment, please call your pharmacy*  Lab Work: BMET and CBC  Testing/Procedures: Your physician has recommended that you have a defibrillator inserted. An implantable cardioverter defibrillator (ICD) is a small device that is placed in your chest or, in rare cases, your abdomen. This device uses electrical pulses or shocks to help control life-threatening, irregular heartbeats that could lead the heart to suddenly stop beating (sudden cardiac arrest). Leads are attached to the ICD that goes into your heart. This is done in the hospital and usually requires an overnight stay. Please see the instruction sheet given to you today for more information.  Follow-Up: At [facility], you and your health needs are our priority.  As part of our continuing mission to provide you with exceptional heart care, we have created designated Provider Care Teams.  These Care Teams include your primary Cardiologist (physician) and Advanced Practice Providers (APPs -  Physician Assistants and Nurse Practitioners) who all work together to provide you with the care you need, when you need it.   Your next appointment:   We will call you to arrange your follow up appointments

## 2023-03-17 NOTE — Patient Instructions (Signed)
Goals Addressed             This Visit's Progress    Pharmacy Goals       Please follow up with [facility] to pick up your medications.  [facility] [place] [address] [place], [place], [place] [address] [phone]  Thank you!  Estelle Grumbles, PharmD, [facility] [phone]

## 2023-03-22 ENCOUNTER — Other Ambulatory Visit: Payer: Self-pay

## 2023-03-23 ENCOUNTER — Telehealth: Payer: Self-pay | Admitting: Family

## 2023-03-23 NOTE — Telephone Encounter (Signed)
pt confirmed appt 03/23/23

## 2023-03-23 NOTE — Progress Notes (Unsigned)
PCP: Margarita Mail, DO (last seen 10/24) Primary Cardiologist: Debbe Odea, MD/ Eula Listen, PA (last seen 11/24)  HPI:  Molly Howard is a 37 y/o female with a history of DM, HTN, previous drug use, NSVT, PSVT, mod/severe TR, CKD, anemia and chronic biventricular heart failure.   Admitted 02/09/22 due to acute on chronic HF. + cocaine. Discharged after 7 days. Admitted 07/24/22 due to decompensated HFrEF. Received IV Lasix. Was on milrinone drip. Digoxin added. Uop about 41 liters with decrease in weight 116 kg >>91 kg. Admitted 11/05/22 due to swelling and fluid retention in arms and legs along with some central chest tightness and shortness of breath and all has been going on for about a week. Needed lasix gtt with milrinone and diuresing well,  total UOP >40 liters. Weight significantly down 130 kg-->97 kg. Weaned off of IV drips. Spironolactone stopped due to hyperkalemia. Urine drug screen + for cocaine. Negative for dvt/stenosis. X-ray nothing acute to explain left arm swelling.    Echo 10/11/20: EF 30-35% along with moderate TR.  Echo 03/20/21: EF 25-30% along with severely elevated PA pressure of 70.8 mmHg, mild LAE, mild MR and mild/moderate TR Echo 06/30/21: EF  30-35% along with mild MR. Echo 09/18/21: EF  25-30%.   Echo 07/25/22: EF 25-30% with moderately elevated PA pressure of 50.7 mmHg, mild LAE, mild MR, small pericardial effusion and moderate/ severe TR  Cardiac MRI 05/19/21:  1. Mild LV dilatation, mild hypertrophy, and mild systolic dysfunction (EF 42%)  2.  No late gadolinium enhancement to suggest myocardial scar  3.  Normal RV size and systolic function (EF 50%)  RHC 07/28/22: Severely elevated right and left-sided filling pressures, moderate pulmonary hypertension and moderately reduced cardiac output. RA: 30 mmHg RV: 56/14 mmHg PW: 34 mmHg PA: 54/30 with a mean of 41 mmHg PA sat is 42% Cardiac output: 4.47 with a cardiac index of 2. CPO is 0.89 PAPI: 0.8  Zio  patch 12/2022: Patient had a min HR of 62 bpm, max HR of 187 bpm, and avg HR of 76 bpm. Predominant underlying rhythm was Sinus Rhythm. 24 Ventricular Tachycardia runs occurred, the run with the fastest interval lasting 7 beats with a max rate of 187 bpm, the longest lasting 13 beats with an avg rate of 128 bpm. 1 run of Supraventricular Tachycardia occurred lasting 6 beats with a max rate of 135 bpm (avg 116 bpm). Isolated SVEs were rare (<1.0%), SVE Couplets were rare (<1.0%), and SVE Triplets were rare (<1.0%). Isolated VEs were rare (<1.0%, 1371), VE Couplets were rare (<1.0%, 120), and VE Triplets were rare (<1.0%, 26). Ventricular Bigeminy was present.  Conclusion Occasional nonsustained VT. 1 episode of paroxysmal SVT. No sustained arrhythmias. No atrial fibrillation or atrial flutter.  She presents today for a HF follow-up visit with a chief complaint of minimal shortness of breath with moderate exertion. Chronic in nature. Has associated fatigue, sensation of being off-balance, occasional chest pain, palpitations and chronic difficulty sleeping along with this. Denies cough, dizziness, weight gain or pedal edema.     Will be having ICD implanted 04/19/23  Drinks ~ 32 oz soda and 32 oz water daily. Drinks 10 twelve ounce cans of beer ~ once/ week. No drug use since 07/24.  ROS: All systems negative except as listed in HPI, PMH and Problem List.  SH:  Social History   Socioeconomic History   Marital status: Single    Spouse name: Not on file   Number of children: Not  on file   Years of education: Not on file   Highest education level: Not on file  Occupational History   Not on file  Tobacco Use   Smoking status: Former    Current packs/day: 0.00    Types: Cigarettes    Quit date: 2022    Years since quitting: 2.8   Smokeless tobacco: Never  Vaping Use   Vaping status: Never Used  Substance and Sexual Activity   Alcohol use: Not Currently    Comment: ~ 4 shots/day on  weekends only   Drug use: Not Currently    Types: Cocaine    Comment: Admits to using cocaine up and will February 2024.   Sexual activity: Not Currently    Birth control/protection: None  Other Topics Concern   Not on file  Social History Narrative   Lives locally.  Does not routinely exercise.  Has been using cocaine.   Social Determinants of Health   Financial Resource Strain: High Risk (06/16/2020)   Overall Financial Resource Strain (CARDIA)    Difficulty of Paying Living Expenses: Hard  Food Insecurity: Food Insecurity Present (11/24/2022)   Hunger Vital Sign    Worried About Running Out of Food in the Last Year: Sometimes true    Ran Out of Food in the Last Year: Sometimes true  Transportation Needs: Unmet Transportation Needs (11/24/2022)   PRAPARE - Administrator, Civil Service (Medical): Yes    Lack of Transportation (Non-Medical): Yes  Physical Activity: Not on file  Stress: Not on file  Social Connections: Not on file  Intimate Partner Violence: Not At Risk (11/06/2022)   Humiliation, Afraid, Rape, and Kick questionnaire    Fear of Current or Ex-Partner: No    Emotionally Abused: No    Physically Abused: No    Sexually Abused: No    FH:  Family History  Problem Relation Age of Onset   Heart failure Mother        Onset of heart failure 40s.  Died in 2022-05-08.   Diabetes Mother    Hypertension Father    Diabetes Father     Past Medical History:  Diagnosis Date   Acid reflux    Chronic HFrEF (heart failure with reduced ejection fraction) (HCC)    a. 10/2019 Echo: EF 35-40%, GrII DD; b. 05/2020 Echo: EF 20-25%, glob HK; c. 10/2020 Echo: EF 30-35%, glob HK. GrII DD, Mildly red RV fxn. Mod TR; d.  05/2021 cMRI: EF 42%, no LGE. Nl RV size/fxn.   CKD (chronic kidney disease), stage II    Diabetes mellitus (HCC)    H/O medication noncompliance    Hypertension    Microcytic anemia    NICM (nonischemic cardiomyopathy) (HCC)    a. 10/2019 Echo: EF  35-40%; b. 10/2019 MV: No ischemia. Small apical defect-->breast attenuation; c. 05/2020 Echo: EF 20-25%; d. 10/2020 Echo: EF 30-35%, glob HK. GrII DD, Mildly red RV fxn. Mod TR; e. 05/2021 cMRI: EF 42%, no LGE. Nl RV size/fxn.   Obesity    Polysubstance abuse (HCC)     Current Outpatient Medications  Medication Sig Dispense Refill   Accu-Chek Softclix Lancets lancets Use as instructed 100 each 12   atorvastatin (LIPITOR) 20 MG tablet Take 1 tablet (20 mg total) by mouth daily. 90 tablet 3   carvedilol (COREG) 6.25 MG tablet Take 1 tablet (6.25 mg total) by mouth 2 (two) times daily with a meal. 180 tablet 3   Continuous Glucose Sensor (FREESTYLE  LIBRE 3 SENSOR) MISC Place 1 sensor on the skin every 14 days. Use to check glucose continuously 2 each 12   dapagliflozin propanediol (FARXIGA) 10 MG TABS tablet Take 1 tablet (10 mg total) by mouth once daily. 90 tablet 1   digoxin (LANOXIN) 0.125 MG tablet Take 0.5 tablets (0.0625 mg total) by mouth daily. 30 tablet 3   sacubitril-valsartan (ENTRESTO) 97-103 MG Take 1 tablet by mouth 2 (two) times daily. 180 tablet 3   Semaglutide,0.25 or 0.5MG /DOS, (OZEMPIC, 0.25 OR 0.5 MG/DOSE,) 2 MG/3ML SOPN Inject 0.5 mg into the skin once a week. 3 mL 1   torsemide (DEMADEX) 20 MG tablet Take 40 mg by mouth 3 (three) times daily.     No current facility-administered medications for this visit.   Vitals:   03/24/23 1509  BP: 123/82  Pulse: 81  SpO2: 100%  Weight: 202 lb 4 oz (91.7 kg)   Wt Readings from Last 3 Encounters:  03/24/23 202 lb 4 oz (91.7 kg)  03/16/23 205 lb 3.2 oz (93.1 kg)  03/10/23 207 lb (93.9 kg)   Lab Results  Component Value Date   CREATININE 1.16 (H) 02/04/2023   CREATININE 1.25 (H) 01/10/2023   CREATININE 1.44 (H) 12/28/2022    PHYSICAL EXAM:  General:  Well appearing. No resp difficulty HEENT: normal Neck: supple. JVP flat. No lymphadenopathy or thryomegaly appreciated. Cor: PMI normal. Regular rate & rhythm. No rubs,  gallops. II/ VI murmur LSB Lungs: clear Abdomen: soft, nontender, nondistended. No hepatosplenomegaly. No bruits or masses.  Extremities: no cyanosis, clubbing, rash, edema Neuro: alert & orientedx3, cranial nerves grossly intact. Moves all 4 extremities w/o difficulty. Affect pleasant.   ECG: not done   ASSESSMENT & PLAN:  1: NICM with reduced ejection fraction- - suspect due to previous cocaine use and uncontrolled HTN  - NYHA class II - euvolemic - not weighing daily; reminded to call for an overnight weight gain of > 2 pounds or a weekly weight gain of > 5 pounds - weight down 6 pounds from last visit here 6 weeks ago - Echo 10/11/20: EF 30-35% along with moderate TR.  - Echo 03/20/21: EF 25-30% along with severely elevated PA pressure of 70.8 mmHg, mild LAE, mild MR and mild/moderate TR - Echo 06/30/21: EF  30-35% along with mild MR. - Echo 09/18/21: EF  25-30%.   - Echo 07/25/22: EF 25-30% with moderately elevated PA pressure of 50.7 mmHg, mild LAE, mild MR, small pericardial effusion and moderate/ severe TR - Cardiac MRI 05/19/21:    1. Mild LV dilatation, mild hypertrophy, and mild systolic   dysfunction (EF 42%)    2. No late gadolinium enhancement to suggest myocardial scar   3. Normal RV size and systolic function (EF 50%) - saw cardiology (D[place]) 11/24 - saw EP Lalla Brothers) 11/24 & will be having ICD implanted in a few weeks - having labs drawn today for upcoming ICD - continue carvedilol 6.25mg  BID - continue farxiga 10mg  daily - continue digoxin 0.0625mg  daily; dig level 11/06/22 was 0.3 - continue entresto 97/103mg  BID  - continue torsemide 40mg  TID  - spironolactone had been stopped due to hyperkalemia and concern of appropriate f/u - not a candidate for advanced therapies due to repeated + drug screens - BNP 11/05/22 was 1267.5  2: HTN- - BP 123/82 - saw PCP Angelica Chessman) 10/24 - BMP 02/04/23 showed sodium 141, potassium 3.8, creatinine 1.16 & GFR 62  3: DM- - A1c 11/05/22  was 9.6%  4:  Anemia- - Hg 01/10/23 showed hemoglobin 10.9  5: Substance use- - no tobacco - no drug use since 07/24 - continues to drink 10 twelve cans of beer over the course of the week;  encouraged her to decrease consumption of any alcohol  Return in 3 months, sooner if needed.

## 2023-03-24 ENCOUNTER — Ambulatory Visit: Payer: Medicaid Other | Attending: Family | Admitting: Family

## 2023-03-24 ENCOUNTER — Other Ambulatory Visit: Payer: Self-pay

## 2023-03-24 ENCOUNTER — Encounter: Payer: Self-pay | Admitting: Family

## 2023-03-24 VITALS — BP 123/82 | HR 81 | Wt 202.2 lb

## 2023-03-24 DIAGNOSIS — D509 Iron deficiency anemia, unspecified: Secondary | ICD-10-CM | POA: Diagnosis not present

## 2023-03-24 DIAGNOSIS — I130 Hypertensive heart and chronic kidney disease with heart failure and stage 1 through stage 4 chronic kidney disease, or unspecified chronic kidney disease: Secondary | ICD-10-CM | POA: Insufficient documentation

## 2023-03-24 DIAGNOSIS — I10 Essential (primary) hypertension: Secondary | ICD-10-CM

## 2023-03-24 DIAGNOSIS — I5022 Chronic systolic (congestive) heart failure: Secondary | ICD-10-CM | POA: Diagnosis not present

## 2023-03-24 DIAGNOSIS — I428 Other cardiomyopathies: Secondary | ICD-10-CM

## 2023-03-24 DIAGNOSIS — F1090 Alcohol use, unspecified, uncomplicated: Secondary | ICD-10-CM | POA: Diagnosis not present

## 2023-03-24 DIAGNOSIS — I5020 Unspecified systolic (congestive) heart failure: Secondary | ICD-10-CM

## 2023-03-24 DIAGNOSIS — Z794 Long term (current) use of insulin: Secondary | ICD-10-CM

## 2023-03-24 DIAGNOSIS — I361 Nonrheumatic tricuspid (valve) insufficiency: Secondary | ICD-10-CM | POA: Diagnosis not present

## 2023-03-24 DIAGNOSIS — I5082 Biventricular heart failure: Secondary | ICD-10-CM | POA: Diagnosis not present

## 2023-03-24 DIAGNOSIS — D631 Anemia in chronic kidney disease: Secondary | ICD-10-CM | POA: Diagnosis not present

## 2023-03-24 DIAGNOSIS — E1122 Type 2 diabetes mellitus with diabetic chronic kidney disease: Secondary | ICD-10-CM | POA: Diagnosis present

## 2023-03-24 DIAGNOSIS — N182 Chronic kidney disease, stage 2 (mild): Secondary | ICD-10-CM | POA: Diagnosis not present

## 2023-03-24 DIAGNOSIS — F1411 Cocaine abuse, in remission: Secondary | ICD-10-CM | POA: Insufficient documentation

## 2023-03-24 DIAGNOSIS — E119 Type 2 diabetes mellitus without complications: Secondary | ICD-10-CM

## 2023-03-24 DIAGNOSIS — Z87891 Personal history of nicotine dependence: Secondary | ICD-10-CM | POA: Diagnosis not present

## 2023-03-24 DIAGNOSIS — N189 Chronic kidney disease, unspecified: Secondary | ICD-10-CM | POA: Diagnosis present

## 2023-03-24 DIAGNOSIS — I5042 Chronic combined systolic (congestive) and diastolic (congestive) heart failure: Secondary | ICD-10-CM

## 2023-03-24 DIAGNOSIS — F1910 Other psychoactive substance abuse, uncomplicated: Secondary | ICD-10-CM

## 2023-03-24 NOTE — Addendum Note (Signed)
Addended by: Rocky Link A on: 03/24/2023 03:35 PM   Modules accepted: Orders

## 2023-03-24 NOTE — Addendum Note (Signed)
Addended by: Rocky Link A on: 03/24/2023 03:34 PM   Modules accepted: Orders

## 2023-03-25 LAB — CBC WITH DIFFERENTIAL/PLATELET
Basophils Absolute: 0.1 10*3/uL (ref 0.0–0.2)
Basos: 2 %
EOS (ABSOLUTE): 0.2 10*3/uL (ref 0.0–0.4)
Eos: 3 %
Hematocrit: 37.6 % (ref 34.0–46.6)
Hemoglobin: 12.3 g/dL (ref 11.1–15.9)
Immature Grans (Abs): 0 10*3/uL (ref 0.0–0.1)
Immature Granulocytes: 0 %
Lymphocytes Absolute: 1.3 10*3/uL (ref 0.7–3.1)
Lymphs: 22 %
MCH: 29.9 pg (ref 26.6–33.0)
MCHC: 32.7 g/dL (ref 31.5–35.7)
MCV: 91 fL (ref 79–97)
Monocytes Absolute: 0.4 10*3/uL (ref 0.1–0.9)
Monocytes: 6 %
Neutrophils Absolute: 4 10*3/uL (ref 1.4–7.0)
Neutrophils: 67 %
Platelets: 368 10*3/uL (ref 150–450)
RBC: 4.12 x10E6/uL (ref 3.77–5.28)
RDW: 12.5 % (ref 11.7–15.4)
WBC: 5.9 10*3/uL (ref 3.4–10.8)

## 2023-03-25 LAB — BASIC METABOLIC PANEL
BUN/Creatinine Ratio: 20 (ref 9–23)
BUN: 27 mg/dL — ABNORMAL HIGH (ref 6–20)
CO2: 26 mmol/L (ref 20–29)
Calcium: 8.5 mg/dL — ABNORMAL LOW (ref 8.7–10.2)
Chloride: 100 mmol/L (ref 96–106)
Creatinine, Ser: 1.34 mg/dL — ABNORMAL HIGH (ref 0.57–1.00)
Glucose: 122 mg/dL — ABNORMAL HIGH (ref 70–99)
Potassium: 4.5 mmol/L (ref 3.5–5.2)
Sodium: 143 mmol/L (ref 134–144)
eGFR: 52 mL/min/{1.73_m2} — ABNORMAL LOW (ref >59)

## 2023-03-25 LAB — DIGOXIN LEVEL: Digoxin, Serum: <0.4 ng/mL — ABNORMAL LOW (ref 0.5–0.9)

## 2023-03-30 ENCOUNTER — Other Ambulatory Visit: Payer: Self-pay | Admitting: Pharmacist

## 2023-03-30 ENCOUNTER — Telehealth: Payer: Self-pay | Admitting: Pharmacist

## 2023-03-30 NOTE — Progress Notes (Signed)
   Outreach Note  03/30/2023 Name: BULA JINKS MRN: [id number] DOB: Aug 09, 1985  Referred by: Margarita Mail, DO  Reach patient by telephone today. She requests that we reschedule this call for another time.  Follow Up Plan: Will outreach to patient by telephone in the next 60 days  Estelle Grumbles, PharmD, [facility] [phone]

## 2023-04-04 ENCOUNTER — Other Ambulatory Visit: Payer: Self-pay | Admitting: Physician Assistant

## 2023-04-04 ENCOUNTER — Other Ambulatory Visit: Payer: Self-pay

## 2023-04-04 ENCOUNTER — Other Ambulatory Visit: Payer: Self-pay | Admitting: *Deleted

## 2023-04-04 DIAGNOSIS — I428 Other cardiomyopathies: Secondary | ICD-10-CM

## 2023-04-04 MED FILL — Torsemide Tab 20 MG: ORAL | 90 days supply | Qty: 180 | Fill #0 | Status: CN

## 2023-04-04 NOTE — Patient Outreach (Signed)
Medicaid Managed Care   Nurse Care Manager Note  04/04/2023 Name:  Molly Howard MRN:  [id number] DOB:  [birth year]  Molly Howard is an 37 y.o. year old female who is a primary patient of Margarita Mail, DO.  The [facility] Managed Care Coordination team was consulted for assistance with:    DMII  Molly Howard was given information about Medicaid Managed Care Coordination team services today. Molly Howard Patient agreed to services and verbal consent obtained.  Engaged with patient by telephone for follow up visit in response to provider referral for case management and/or care coordination services.   Assessments/Interventions:  Review of past medical history, allergies, medications, health status, including review of consultants reports, laboratory and other test data, was performed as part of comprehensive evaluation and provision of chronic care management services.  SDOH (Social Determinants of Health) assessments and interventions performed: SDOH Interventions    Flowsheet Row Patient Outreach Telephone from 04/04/2023 in [place] POPUL[facility] DEPARTMENT Patient Outreach Telephone from 11/24/2022 in [place] POPULATION HEALTH DEPARTMENT Telephone from 06/30/2020 in [facility] Telephone from 06/16/2020 in [facility]  SDOH Interventions      Food Insecurity Interventions Other (Comment)  [BSW referral] Other (Comment)  [working with BSW for food assistance] -- Other (Comment)  [will help with transportation to get to food pantries as that was the main barrier]  Housing Interventions Other (Comment)  [BSW referral] Intervention Not Indicated -- Other (Comment)  [referred to [employer] to apply for low income water assistance program]  Transportation Interventions -- Payor Benefit  [Provided with [employer] transportation [phone] [employer]  Utilities Interventions -- Intervention Not  Indicated -- --  Financial Strain Interventions -- -- -- Other (Comment)  [providing [employer] gift cards to help with medication costs]       Care Plan  Allergies  Allergen Reactions   No [employer] Food Allergies Rash and Other (See Comments)    Lemons    Medications Reviewed Today     Reviewed by Heidi Dach, RN (Registered Nurse) on 04/04/23 at 1340  Med List Status: <None>   Medication Order Taking? Sig Documenting Provider Last Dose Status Informant  Accu-Chek Softclix Lancets lancets [id number] No Use as instructed  Patient not taking: Reported on 04/04/2023   Margarita Mail, DO Not Taking Active   atorvastatin (LIPITOR) 20 MG tablet [id number] No Take 1 tablet (20 mg total) by mouth daily.  Patient not taking: Reported on 04/04/2023   Sondra Barges, PA-C Not Taking Active   carvedilol (COREG) 6.25 MG tablet [id number] No Take 1 tablet (6.25 mg total) by mouth 2 (two) times daily with a meal.  Patient not taking: Reported on 04/04/2023   Sondra Barges, PA-C Not Taking Active   Continuous Glucose Sensor (FREESTYLE LIBRE 3 SENSOR) [place] [id number] No Place 1 sensor on the skin every 14 days. Use to check glucose continuously  Patient not taking: Reported on 04/04/2023   Margarita Mail, DO Not Taking Active   dapagliflozin propanediol (FARXIGA) 10 MG TABS tablet [id number] No Take 1 tablet (10 mg total) by mouth once daily.  Patient not taking: Reported on 04/04/2023   Margarita Mail, DO Not Taking Active   digoxin (LANOXIN) 0.125 MG tablet [id number] No Take 0.5 tablets (0.0625 mg total) by mouth daily.  Patient not taking: Reported on 04/04/2023   Donald Siva Not Taking Active     Discontinued 10/18/20  1034 (Reorder) sacubitril-valsartan (ENTRESTO) 97-103 MG [id number] No Take 1 tablet by mouth 2 (two) times daily.  Patient not taking: Reported on 04/04/2023   Sondra Barges, PA-C Not Taking Active   Semaglutide,0.25 or 0.5MG /DOS, (OZEMPIC, 0.25 OR 0.5 MG/DOSE,) 2  MG/3ML SOPN [id number] No Inject 0.5 mg into the skin once a week.  Patient not taking: Reported on 04/04/2023   Margarita Mail, DO Not Taking Active   torsemide (DEMADEX) 20 MG tablet [id number] No Take 40 mg by mouth 3 (three) times daily.  Patient not taking: Reported on 04/04/2023   [provider] Not Taking Active   Med List Note Erasmo Leventhal 11/27/20 0865): Patient states she has been out of her medications for "a while now". It is due to financial limitations.            Patient Active Problem List   Diagnosis Date Noted   Cardiogenic shock (HCC) 11/14/2022   Bilateral leg edema 11/06/2022   NICM (nonischemic cardiomyopathy) (HCC) 07/31/2022   Hypocalcemia 07/25/2022   Leg edema 07/25/2022   Acute on chronic combined systolic and diastolic CHF (congestive heart failure) (HCC) 07/25/2022   GERD without esophagitis 07/24/2022   Cocaine abuse (HCC) 02/12/2022   Acute pulmonary edema (HCC)    Syncope 02/09/2022   Menorrhagia with regular cycle 06/22/2021   Dyslipidemia 11/27/2020   Chronic kidney disease (CKD), stage II (mild) 11/27/2020   Hypomagnesemia 10/17/2020   Dyspnea 05/21/2020   Essential hypertension 05/20/2020   Diabetes mellitus, type II (HCC) 05/20/2020   Chronic anemia 05/20/2020   Reactive thrombocytosis 05/20/2020   Obesity (BMI 30-39.9) 05/20/2020   Chronic combined systolic and diastolic heart failure (HCC) 11/01/2019   Financial difficulties 11/01/2019   Medication management 11/01/2019   Lung nodule 11/01/2019   Nonischemic cardiomyopathy (HCC) 11/01/2019   Polysubstance abuse (HCC) 11/01/2019   Tobacco use 11/01/2019   Swelling     Conditions to be addressed/monitored per PCP order:  DMII  Care Plan : RN Care Manager Plan of Care  Updates made by Heidi Dach, RN since 04/04/2023 12:00 AM     Problem: Health Management needs related to DMII      Long-Range Goal: Development of Plan of Care to address Health  Management needs related to DMII   Start Date: 11/24/2022  Expected End Date: 02/22/2023  Note:   Current Barriers:  Chronic Disease Management support and education needs related to DMII-Molly Howard has not been taking any of her medications due to not have the medicines. She was living with her sister and they had do move out of the home. They need $1700 to be able to move back into the home.  RNCM Clinical Goal(s):  Patient will verbalize understanding of plan for management of DMII as evidenced by patient reports take all medications exactly as prescribed and will call provider for medication related questions as evidenced by patient reports    attend all scheduled medical appointments: 12/6 with Cardiology, 1/6 with Pharmacist, 1/17 with PCP, 2/21 with HF Clinic as evidenced by provider documentation in EMR        continue to work with RN Care Manager and/or Social Worker to address care management and care coordination needs related to DMII as evidenced by adherence to CM Team Scheduled appointments     work with Child psychotherapist to address Financial constraints related to no income and Limited access to food related to the management of DMII as evidenced by review of EMR and  patient or Child psychotherapist report     through collaboration with Medical illustrator, provider, and care team.   Interventions: Inter-disciplinary care team collaboration (see longitudinal plan of care) Evaluation of current treatment plan related to  self management and patient's adherence to plan as established by provider   Diabetes:  (Status: Goal on Track (progressing): YES.) Long Term Goal   Lab Results  Component Value Date   HGBA1C 7.7 (A) 02/17/2023   @ Assessed patient's understanding of A1c goal: <7% Provided education to patient about basic DM disease process; Reviewed medications with patient and discussed importance of medication adherence;        Reviewed prescribed diet with patient MyPlate method, decrease  carbohydrates, sweets and sweet drinks; Discussed plans with patient for ongoing care management follow up and provided patient with direct contact information for care management team;      Reviewed scheduled/upcoming provider appointments including: 12/6 with Cardiology, 12/17 for ICD placement, 1/6 with Pharmacist, 11/21 with HF Clinic;         Review of patient status, including review of consultants reports, relevant laboratory and other test results, and medications completed;       Assessed social determinant of health barriers;        Provided patient with [facility] transportation [phone], advised to call today to arrange transportation to appointment on 12/6, address and phone number provided Advised patient to contact [facility] member services [phone] for member benefitss(fresh fruit and vegetables)-revisited Discussed disability application-patient waiting on determination Advised patient to journal BS readings and take to next PCP appointment Scheduled with BSW for housing and food resources-04/05/23 at 4pm Attempted to reach patient's landlord to discuss patient getting her medications out to the home-left message with RNCM contact and Molly Howard's contact Contacted pharmacy requesting any medications that can be refilled-pharmacy will fill torsemide, digoxin, farxiga and freestyle sensors   Patient Goals/Self-Care Activities: Take medications as prescribed   Attend all scheduled provider appointments Call provider office for new concerns or questions  Work with the social worker to address care coordination needs and will continue to work with the clinical team to address health care and disease management related needs schedule appointment with eye doctor drink 6 to 8 glasses of water each day fill half of plate with vegetables limit fast food meals to no more than 1 per week       Follow Up:  Patient agrees to Care Plan and Follow-up.  Plan: The Managed Medicaid care  management team will reach out to the patient again over the next 30 days.  Date/time of next scheduled RN care management/care coordination outreach:  04/28/23 at 3:15pm  Estanislado Emms RN, BSN   Value-Based Care [facility] RN Care Coordinator [phone]

## 2023-04-04 NOTE — Patient Instructions (Signed)
Visit Information  Molly Howard was given information about Medicaid Managed Care team care coordination services as a part of their [facility] Community Plan Medicaid benefit. Molly Howard verbally consented to engagement with the [facility] Managed Care team.   If you are experiencing a medical emergency, please call 911 or report to your local emergency department or urgent care.   If you have a non-emergency medical problem during routine business hours, please contact your provider's office and ask to speak with a nurse.   For questions related to your [facility], please call: [phone] or visit the homepage here: [email or web address]  If you would like to schedule transportation through your [facility], please call the following number at least 2 days in advance of your appointment: [phone]   Rides for urgent appointments can also be made after hours by calling Member Services.  Call the Behavioral Health Crisis Line at [phone], at any time, 24 hours a day, 7 days a week. If you are in danger or need immediate medical attention call 911.  If you would like help to quit smoking, call 1-800-QUIT-NOW ((267)579-1444) OR Espaol: 1-855-Djelo-Ya (9-371-696-7893) o para ms informacin haga clic aqu or Text READY to 810-175 to register via text  Molly Howard,   Please see education materials related to DM provided as print materials.   The patient verbalized understanding of instructions, educational materials, and care plan provided today and agreed to receive a mailed copy of patient instructions, educational materials, and care plan.   Telephone follow up appointment with Managed Medicaid care management team member scheduled for:04/28/23 at 3:15pm  Molly Emms RN, BSN Molly Howard  [facility] RN Care  Coordinator [phone]   Following is a copy of your plan of care:  Care Plan : RN Care Manager Plan of Care  Updates made by Molly Dach, RN since 04/04/2023 12:00 AM     Problem: Health Management needs related to DMII      Long-Range Goal: Development of Plan of Care to address Health Management needs related to DMII   Start Date: 11/24/2022  Expected End Date: 02/22/2023  Note:   Current Barriers:  Chronic Disease Management support and education needs related to DMII-Molly Howard has not been taking any of her medications due to not have the medicines. She was living with her sister and they had do move out of the home. They need $1700 to be able to move back into the home.  RNCM Clinical Goal(s):  Patient will verbalize understanding of plan for management of DMII as evidenced by patient reports take all medications exactly as prescribed and will call provider for medication related questions as evidenced by patient reports    attend all scheduled medical appointments: 12/6 with Cardiology, 1/6 with Pharmacist, 1/17 with PCP, 2/21 with HF Clinic as evidenced by provider documentation in EMR        continue to work with RN Care Manager and/or Social Worker to address care management and care coordination needs related to DMII as evidenced by adherence to CM Team Scheduled appointments     work with Child psychotherapist to address Financial constraints related to no income and Limited access to food related to the management of DMII as evidenced by review of EMR and patient or Child psychotherapist report     through collaboration with Medical illustrator, provider, and care team.   Interventions: Inter-disciplinary care team collaboration (see longitudinal plan  of care) Evaluation of current treatment plan related to  self management and patient's adherence to plan as established by provider   Diabetes:  (Status: Goal on Track (progressing): YES.) Long Term Goal   Lab Results  Component Value  Date   HGBA1C 7.7 (A) 02/17/2023   @ Assessed patient's understanding of A1c goal: <7% Provided education to patient about basic DM disease process; Reviewed medications with patient and discussed importance of medication adherence;        Reviewed prescribed diet with patient MyPlate method, decrease carbohydrates, sweets and sweet drinks; Discussed plans with patient for ongoing care management follow up and provided patient with direct contact information for care management team;      Reviewed scheduled/upcoming provider appointments including: 12/6 with Cardiology, 12/17 for ICD placement, 1/6 with Pharmacist, 11/21 with HF Clinic;         Review of patient status, including review of consultants reports, relevant laboratory and other test results, and medications completed;       Assessed social determinant of health barriers;        Provided patient with [facility] transportation [phone], advised to call today to arrange transportation to appointment on 12/6, address and phone number provided Advised patient to contact [facility] member services [phone] for member benefitss(fresh fruit and vegetables)-revisited Discussed disability application-patient waiting on determination Advised patient to journal BS readings and take to next PCP appointment Scheduled with BSW for housing and food resources-04/05/23 at 4pm Attempted to reach patient's landlord to discuss patient getting her medications out to the home-left message with RNCM contact and Ms. Crevier's contact Contacted pharmacy requesting any medications that can be refilled-pharmacy will fill torsemide, digoxin, farxiga and freestyle sensors   Patient Goals/Self-Care Activities: Take medications as prescribed   Attend all scheduled provider appointments Call provider office for new concerns or questions  Work with the social worker to address care coordination needs and will continue to work with the clinical team to address health  care and disease management related needs schedule appointment with eye doctor drink 6 to 8 glasses of water each day fill half of plate with vegetables limit fast food meals to no more than 1 per week

## 2023-04-05 ENCOUNTER — Other Ambulatory Visit: Payer: Self-pay

## 2023-04-05 NOTE — Patient Outreach (Signed)
Medicaid Managed Care Social Work Note  04/05/2023 Name:  Molly Howard MRN:  [id number] DOB:  1985/05/05  Molly Howard is an 37 y.o. year old female who is a primary patient of Margarita Mail, DO.  The Medicaid Managed Care Coordination team was consulted for assistance with:  [employer]  housing  Ms. Evetts was given information about Medicaid Managed Care Coordination team services today. Matty L Beier Patient agreed to services and verbal consent obtained.  Engaged with patient  for by telephone forfollow up visit in response to referral for case management and/or care coordination services.   Assessments/Interventions:  Review of past medical history, allergies, medications, health status, including review of consultants reports, laboratory and other test data, was performed as part of comprehensive evaluation and provision of chronic care management services.  SDOH: (Social Determinant of Health) assessments and interventions performed: SDOH Interventions    Flowsheet Row Patient Outreach Telephone from 04/04/2023 in [place] POPULATION HEALTH DEPARTMENT Patient Outreach Telephone from 11/24/2022 in [place] POPULATION HEALTH DEPARTMENT Telephone from 06/30/2020 in [facility] [place] Telephone from 06/16/2020 in [facility]  SDOH Interventions      Food Insecurity Interventions Other (Comment)  [BSW referral] Other (Comment)  [working with BSW for food assistance] -- Other (Comment)  [will help with transportation to get to food pantries as that was the main barrier]  Housing Interventions Other (Comment)  [BSW referral] Intervention Not Indicated -- Other (Comment)  [referred to [employer] to apply for low income water assistance program]  Transportation Interventions -- Payor Benefit  [Provided with [employer] transportation [phone] [employer]  Utilities Interventions --  Intervention Not Indicated -- --  Financial Strain Interventions -- -- -- Other (Comment)  [providing [employer] gift cards to help with medication costs]     BSW completed a telephone outreach with patient, she states she and her sister are living in a hotel, she states she is in need of food. BSW and patient agreed for resources to be emailed to her at [email or web address]  Advanced Directives Status:  Not addressed in this encounter.  Care Plan                 Allergies  Allergen Reactions   No [employer] Food Allergies Rash and Other (See Comments)    Lemons    Medications Reviewed Today   Medications were not reviewed in this encounter     Patient Active Problem List   Diagnosis Date Noted   Cardiogenic shock (HCC) 11/14/2022   Bilateral leg edema 11/06/2022   NICM (nonischemic cardiomyopathy) (HCC) 07/31/2022   Hypocalcemia 07/25/2022   Leg edema 07/25/2022   Acute on chronic combined systolic and diastolic CHF (congestive heart failure) (HCC) 07/25/2022   GERD without esophagitis 07/24/2022   Cocaine abuse (HCC) 02/12/2022   Acute pulmonary edema (HCC)    Syncope 02/09/2022   Menorrhagia with regular cycle 06/22/2021   Dyslipidemia 11/27/2020   Chronic kidney disease (CKD), stage II (mild) 11/27/2020   Hypomagnesemia 10/17/2020   Dyspnea 05/21/2020   Essential hypertension 05/20/2020   Diabetes mellitus, type II (HCC) 05/20/2020   Chronic anemia 05/20/2020   Reactive thrombocytosis 05/20/2020   Obesity (BMI 30-39.9) 05/20/2020   Chronic combined systolic and diastolic heart failure (HCC) 11/01/2019   Financial difficulties 11/01/2019   Medication management 11/01/2019   Lung nodule 11/01/2019   Nonischemic cardiomyopathy (HCC) 11/01/2019   Polysubstance abuse (HCC) 11/01/2019   Tobacco use  11/01/2019   Swelling     Conditions to be addressed/monitored per PCP order:   community and housing resources  There are no care plans that you recently modified to  display for this patient.   Follow up:  Patient agrees to Care Plan and Follow-up.  Plan: The Managed Medicaid care management team will reach out to the patient again over the next 10 days.  Date/time of next scheduled Social Work care management/care coordination outreach:  04/19/23  Gus Puma, Kenard Gower, [facility]  Managed [facility] Social Worker [phone]

## 2023-04-05 NOTE — Patient Instructions (Signed)
Visit Information  Ms. Grimmer was given information about Medicaid Managed Care team care coordination services as a part of their [facility] Community Plan Medicaid benefit. Lakenzie L Hemberger verbally consented to engagement with the [facility] Managed Care team.   If you are experiencing a medical emergency, please call 911 or report to your local emergency department or urgent care.   If you have a non-emergency medical problem during routine business hours, please contact your provider's office and ask to speak with a nurse.   For questions related to your [facility], please call: [phone] or visit the homepage here: [email or web address]  If you would like to schedule transportation through your [facility], please call the following number at least 2 days in advance of your appointment: [phone]   Rides for urgent appointments can also be made after hours by calling Member Services.  Call the Behavioral Health Crisis Line at [phone], at any time, 24 hours a day, 7 days a week. If you are in danger or need immediate medical attention call 911.  If you would like help to quit smoking, call 1-800-QUIT-NOW (701-468-5966) OR Espaol: 1-855-Djelo-Ya (4-403-474-2595) o para ms informacin haga clic aqu or Text READY to 638-756 to register via text  Ms. Varnell - following are the goals we discussed in your visit today:   Goals Addressed   None       Social Worker will follow up in 10 days.   Gus Puma, Kenard Gower, MHA [facility] Health  Managed Medicaid Social Worker [phone]   Following is a copy of your plan of care:  There are no care plans that you recently modified to display for this patient.

## 2023-04-07 NOTE — Progress Notes (Unsigned)
Cardiology Office Note    Date:  04/07/2023   ID:  AERICA Howard, DOB 09/21/1985, MRN [id number]  PCP:  Molly Mail, DO  Cardiologist:  Molly Odea, MD  Electrophysiologist:  Molly Prude, MD   Chief Complaint: Follow-up  History of Present Illness:   Molly Howard is a 37 y.o. 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    Her cardiomyopathy dates back to 2021 with echo at that time showing an EF of 35 to 40%.  Cardiac MRI in 05/2020 showed an EF of 42%, mild LV dilatation, RVEF 50%, and no delayed enhancement.  Echo from 07/2022 showed an EF of 25 to 30%, mildly decreased RV systolic function, PASP 50 mmHg, moderate to severe tricuspid regurgitation, and mild mitral regurgitation.  RHC in 07/2022 showed elevated right greater than left heart filling pressures, pulmonary venous hypertension, and moderately reduced cardiac output.  She has required ongoing hospital admissions for volume overload requiring aggressive diuresis and augmentation with milrinone.  Following discharge in 07/2022, she never followed up with cardiology.  She was most recently admitted to the hospital in 11/2022 for volume overload after running out of all of her medications.  She continued to use cocaine regularly and was cocaine positive that admission.  She was followed by the advanced heart failure service during the admission with symptomatic improvement following aggressive diuresis, net -36 L for the admission.     She was seen in hospital follow-up on 12/29/2022 and reported adherence to cardiac medications.  She denies smoking tobacco or using cocaine since hospital discharge.  She was drinking 2-3 beers nightly.  When compared to her visit in our  office in 01/2022, her weight was down 54 pounds.  She reported intermittent palpitations.  Entresto was titrated to 49/51 mg twice daily with continuation of low, Farxiga, torsemide, and digoxin.  Zio patch showed a predominant rhythm of sinus with an average rate of 76 bpm with 24 episodes of NSVT lasting up to 13 beats and 1 episode of SVT lasting 6 beats.   She was seen in the office on 02/04/2023 and remained without symptoms of angina or cardiac decompensation.  Her weight was down another 7 pounds when compared to her visit in 12/2022.  She reported having been out of torsemide for approximately 2 weeks and atorvastatin for 2 to 3 days.  She reported adherence to remaining cardiac medications.  She was adding salt to food and eating out at restaurants/takeout food occasionally.  She continued to drink 2-3 beers nightly and would drink greater than 2 L of liquid on top of this.  She reported she had not smoked tobacco or used cocaine since leading up to her admission in 11/2022.  She did continue to note intermittent palpitations.  Carvedilol was titrated to 6.25 mg twice daily with continuation of Entresto, Farxiga, and digoxin.  She was restarted on torsemide 40 mg daily.  She was last seen by general cardiology on 03/10/2023 and remained without symptoms of angina or cardiac decompensation.  She reported some intermittent shortness of breath and intermittent lower extremity swelling.  Entresto was titrated to 97/23 mg twice daily with titration of torsemide to 20 mg 3 times daily for 3 days followed by 20 mg twice daily thereafter.  She  reported no cocaine use since 11/2022 and adherence to pharmacotherapy.  She was also referred to our Child psychotherapist for food insecurities.  She was reevaluated by EP later in 03/2023 and is scheduled for subcutaneous ICD implant on 04/19/2023.  ***   Labs independently reviewed: 03/2023 - digoxin less than 0.4, Hgb 12.3, PLT 368, BUN 27, serum creatinine 1.34, potassium  4.5 02/2023 - A1c 7.7 01/2023 - magnesium 1.9 11/2022 - free T4 normal, albumin 1.8, AST/ALT normal, urine drug screen positive for cocaine, A1c 11.7 05/2022 - TC 216, TG 124, HDL 46, LDL 145  Past Medical History:  Diagnosis Date   Acid reflux    Chronic HFrEF (heart failure with reduced ejection fraction) (HCC)    a. 10/2019 Echo: EF 35-40%, GrII DD; b. 05/2020 Echo: EF 20-25%, glob HK; c. 10/2020 Echo: EF 30-35%, glob HK. GrII DD, Mildly red RV fxn. Mod TR; d.  05/2021 cMRI: EF 42%, no LGE. Nl RV size/fxn.   CKD (chronic kidney disease), stage II    Diabetes mellitus (HCC)    H/O medication noncompliance    Hypertension    Microcytic anemia    NICM (nonischemic cardiomyopathy) (HCC)    a. 10/2019 Echo: EF 35-40%; b. 10/2019 MV: No ischemia. Small apical defect-->breast attenuation; c. 05/2020 Echo: EF 20-25%; d. 10/2020 Echo: EF 30-35%, glob HK. GrII DD, Mildly red RV fxn. Mod TR; e. 05/2021 cMRI: EF 42%, no LGE. Nl RV size/fxn.   Obesity    Polysubstance abuse [facility])     Past Surgical History:  Procedure Laterality Date   CHOLECYSTECTOMY     HERNIA REPAIR     RIGHT HEART CATH N/A 07/28/2022   Procedure: RIGHT HEART CATH;  Surgeon: Iran Ouch, MD;  Location: [facility] INVASIVE CV LAB;  Service: Cardiovascular;  Laterality: N/A;    Current Medications: No outpatient medications have been marked as taking for the 04/08/23 encounter (Appointment) with Molly Barges, PA-C.    Allergies:   No healthtouch food allergies   Social History   Socioeconomic History   Marital status: Single    Spouse name: Not on file   Number of children: Not on file   Years of education: Not on file   Highest education level: Not on file  Occupational History   Not on file  Tobacco Use   Smoking status: Former    Current packs/day: 0.00    Types: Cigarettes    Quit date: 2022    Years since quitting: 2.9   Smokeless tobacco: Never  Vaping Use   Vaping status: Never Used  Substance and Sexual  Activity   Alcohol use: Not Currently    Comment: ~ 4 shots/day on weekends only   Drug use: Not Currently    Types: Cocaine    Comment: Admits to using cocaine up and will February 2024.   Sexual activity: Not Currently    Birth control/protection: None  Other Topics Concern   Not on file  Social History Narrative   Lives locally.  Does not routinely exercise.  Has been using cocaine.   Social Determinants of Health   Financial Resource Strain: High Risk (06/16/2020)   Overall Financial Resource Strain (CARDIA)    Difficulty of Paying Living Expenses: Hard  Food Insecurity: Food Insecurity Present (04/04/2023)   Hunger Vital Sign    Worried About Running Out of Food in the Last Year: Sometimes true    Ran Out of Food in the Last Year: Sometimes true  Transportation  Needs: Unmet Transportation Needs (11/24/2022)   PRAPARE - Administrator, Civil Service (Medical): Yes    Lack of Transportation (Non-Medical): Yes  Physical Activity: Not on file  Stress: Not on file  Social Connections: Not on file     Family History:  The patient's family history includes Diabetes in her father and mother; Heart failure in her mother; Hypertension in her father.  ROS:   12-point review of systems is negative unless otherwise noted in the HPI.   EKGs/Labs/Other Studies Reviewed:    Studies reviewed were summarized above. The additional studies were reviewed today:  Zio patch 12/2022: Patient had a min HR of 62 bpm, max HR of 187 bpm, and avg HR of 76 bpm. Predominant underlying rhythm was Sinus Rhythm. 24 Ventricular Tachycardia runs occurred, the run with the fastest interval lasting 7 beats with a max rate of 187 bpm, the longest  lasting 13 beats with an avg rate of 128 bpm. 1 run of Supraventricular Tachycardia occurred lasting 6 beats with a max rate of 135 bpm (avg 116 bpm). Isolated SVEs were rare (<1.0%), SVE Couplets were rare (<1.0%), and SVE Triplets were rare (<1.0%).   Isolated VEs were rare (<1.0%, 1371), VE Couplets were rare (<1.0%, 120), and VE Triplets were rare (<1.0%, 26). Ventricular Bigeminy was present.    Conclusion Occasional nonsustained VT. 1 episode of paroxysmal SVT. No sustained arrhythmias. No atrial fibrillation or atrial flutter. __________   RHC 07/28/2022: Successful right heart catheterization via the right antecubital vein. Severely elevated right and left-sided filling pressures, moderate pulmonary hypertension and moderately reduced cardiac output.   RA: 30 mmHg RV: 56/14 mmHg PW: 34 mmHg PA: 54/30 with a mean of 41 mmHg PA sat is 42% Cardiac output: 4.47 with a cardiac index of 2. CPO is 0.89 PAPI: 0.8   Recommendations: The patient is severely volume overloaded with evidence of biventricular failure right more than left by hemodynamics. Recommend starting milrinone drip and furosemide drip.  Will transfer to stepdown unit. __________   2D echo 07/25/2022: 1. Left ventricular ejection fraction, by estimation, is 25 to 30%. Left  ventricular ejection fraction by 2D MOD biplane is 21.6 %. The left  ventricle has severely decreased function. The left ventricle demonstrates  global hypokinesis. Left ventricular  diastolic parameters are indeterminate.   2. Right ventricular systolic function is mildly reduced. The right  ventricular size is normal. There is moderately elevated pulmonary artery  systolic pressure. The estimated right ventricular systolic pressure is  50.7 mmHg.   3. Left atrial size was mildly dilated.   4. A small pericardial effusion is present. The pericardial effusion is  circumferential. There is no evidence of cardiac tamponade.   5. The aortic valve is tricuspid. Aortic valve regurgitation is not  visualized. No aortic stenosis is present.   6. The mitral valve is normal in structure. Mild mitral valve  regurgitation. No evidence of mitral stenosis.   7. Tricuspid valve regurgitation is  moderate to severe.  __________   Limited echo 09/18/2021: 1. Left ventricular ejection fraction, by estimation, is 25 to 30%. Left  ventricular ejection fraction by 3D volume is 28 %. The left ventricle has  severely decreased function. The left ventricle demonstrates global  hypokinesis. Left ventricular  diastolic parameters are consistent with Grade II diastolic dysfunction  (pseudonormalization). The average left ventricular global longitudinal  strain is -5.9 %. The global longitudinal strain is abnormal.   2. Right ventricular systolic function is  mildly reduced. The right  ventricular size is mildly enlarged. Tricuspid regurgitation signal is  inadequate for assessing PA pressure.   3. The mitral valve is normal in structure. No evidence of mitral valve  regurgitation. No evidence of mitral stenosis.   4. The aortic valve is tricuspid. Aortic valve regurgitation is not  visualized. No aortic stenosis is present.   5. The inferior vena cava is normal in size with greater than 50%  respiratory variability, suggesting right atrial pressure of 3 mmHg.  __________   2D echo 06/30/2021: 1. Left ventricular ejection fraction, by estimation, is 30 to 35%. The  left ventricle has moderately decreased function. The left ventricle  demonstrates global hypokinesis. The left ventricular internal cavity size  was mildly dilated. Left ventricular  diastolic parameters are consistent with Grade II diastolic dysfunction  (pseudonormalization).   2. Right ventricular systolic function is normal. The right ventricular  size is normal. Tricuspid regurgitation signal is inadequate for assessing  PA pressure.   3. The mitral valve is normal in structure. Mild mitral valve  regurgitation. No evidence of mitral stenosis.   4. The aortic valve is tricuspid. Aortic valve regurgitation is not  visualized. No aortic stenosis is present.   5. The inferior vena cava is normal in size with greater than  50%  respiratory variability, suggesting right atrial pressure of 3 mmHg.  __________   Cardiac MRI 05/19/2021: IMPRESSION: 1. Mild LV dilatation, mild hypertrophy, and mild systolic dysfunction (EF 42%)  2.  No late gadolinium enhancement to suggest myocardial scar 3.  Normal RV size and systolic function (EF 50%) ___________   2D echo 03/20/2021: 1. Left ventricular ejection fraction, by estimation, is 25 to 30%. The  left ventricle has severely decreased function. The left ventricle  demonstrates global hypokinesis. Left ventricular diastolic parameters are  consistent with Grade II diastolic  dysfunction (pseudonormalization). The average left ventricular global  longitudinal strain is -9.2 %. The global longitudinal strain is abnormal.   2. Right ventricular systolic function is mildly reduced. The right  ventricular size is mildly enlarged. There is severely elevated pulmonary  artery systolic pressure. The estimated right ventricular systolic  pressure is 70.8 mmHg.   3. Left atrial size was mildly dilated.   4. The mitral valve is normal in structure. Mild mitral valve  regurgitation. No evidence of mitral stenosis.   5. Tricuspid valve regurgitation is mild to moderate.   6. The inferior vena cava is dilated in size with <50% respiratory  variability, suggesting right atrial pressure of 15 mmHg.  __________   2D echo 10/11/2020: 1. Left ventricular ejection fraction, by estimation, is 30 to 35%. The  left ventricle has moderate to severely decreased function. The left  ventricle demonstrates global hypokinesis. Left ventricular diastolic  parameters are consistent with Grade II  diastolic dysfunction (pseudonormalization).   2. Right ventricular systolic function is mildly reduced. The right  ventricular size is normal.   3. A small pericardial effusion is present.   4. The mitral valve is normal in structure. No evidence of mitral valve  regurgitation.   5. Tricuspid  valve regurgitation is moderate.   6. The aortic valve is tricuspid. Aortic valve regurgitation is not  visualized.  __________   2D echo 05/20/2020: 1. Left ventricular ejection fraction, by estimation, is 20 to 25%. The  left ventricle has severely decreased function. The left ventricle  demonstrates global hypokinesis. The left ventricular internal cavity size  was mildly dilated. Indeterminate  diastolic filling due to E-A fusion.   2. Right ventricular systolic function is normal. The right ventricular  size is mildly enlarged. There is severely elevated pulmonary artery  systolic pressure.   3. The mitral valve is normal in structure. Trivial mitral valve  regurgitation. No evidence of mitral stenosis.   4. Tricuspid valve regurgitation is moderate.   5. The aortic valve was not well visualized. Aortic valve regurgitation  is not visualized. No aortic stenosis is present.   6. The inferior vena cava is dilated in size with <50% respiratory  variability, suggesting right atrial pressure of 15 mmHg.  __________   Eugenie Birks MPI 10/05/2019: There was no ST segment deviation noted during stress. T wave inversion was noted during stress in the I and aVL leads. Defect 1: There is a small defect of mild severity present in the apex location. This is likely due to breast attenuation artifact Nuclear stress EF: 30%. This is a high risk study due to low EF. Low EF seems to be due to nonischemic cardiomyopathy. __________   2D echo 10/03/2019: 1. Left ventricular ejection fraction, by estimation, is 35 to 40%. The  left ventricle has moderately decreased function. The left ventricle  demonstrates global hypokinesis. Left ventricular diastolic parameters are  consistent with Grade II diastolic  dysfunction (pseudonormalization).   2. Right ventricular systolic function is normal. The right ventricular  size is normal.   3. The mitral valve is normal in structure. No evidence of mitral  valve  regurgitation. No evidence of mitral stenosis.   4. The aortic valve is normal in structure. Aortic valve regurgitation is  not visualized. No aortic stenosis is present.   5. The inferior vena cava is normal in size with greater than 50%  respiratory variability, suggesting right atrial pressure of 3 mmHg.    EKG:  EKG is ordered today.  The EKG ordered today demonstrates ***  Recent Labs: 11/05/2022: B Natriuretic Peptide 1,267.5 11/06/2022: ALT 11 01/10/2023: Magnesium 1.9 03/24/2023: BUN 27; Creatinine, Ser 1.34; Hemoglobin 12.3; Platelets 368; Potassium 4.5; Sodium 143  Recent Lipid Panel    Component Value Date/Time   CHOL 216 (H) 05/27/2022 1022   TRIG 124 05/27/2022 1022   HDL 46 05/27/2022 1022   CHOLHDL 4.7 05/27/2022 1022   VLDL 25 05/27/2022 1022   LDLCALC 145 (H) 05/27/2022 1022   LDLCALC 171 (H) 06/12/2021 1354    PHYSICAL EXAM:    VS:  There were no vitals taken for this visit.  BMI: There is no height or weight on file to calculate BMI.  Physical Exam  Wt Readings from Last 3 Encounters:  03/24/23 202 lb 4 oz (91.7 kg)  03/16/23 205 lb 3.2 oz (93.1 kg)  03/10/23 207 lb (93.9 kg)     ASSESSMENT & PLAN:   Chronic combined systolic and diastolic CHF with BiV failure:   Moderate to severe tricuspid regurgitation:   Nonobstructive CAD:   Palpitations/NSVT/PSVT:  HTN: Blood pressure  Cocaine/tobacco/alcohol use:  Iron deficiency anemia:  Uncontrolled DM2:  DJD stage IIIa:   {Are you ordering a CV Procedure (e.g. stress test, cath, DCCV, TEE, etc)?   Press F2        :[id number]}     Disposition: F/u with Dr. Azucena Cecil or an APP in ***, and EP as directed.    Medication Adjustments/Labs and Tests Ordered: Current medicines are reviewed at length with the patient today.  Concerns regarding medicines are outlined above. Medication changes, Labs and Tests ordered  today are summarized above and listed in the Patient Instructions accessible in  Encounters.   Signed, Eula Listen, PA-C 04/07/2023 10:08 AM     [facility] [address] [place], [place] [address] [phone]

## 2023-04-08 ENCOUNTER — Ambulatory Visit: Payer: Medicaid Other | Admitting: Physician Assistant

## 2023-04-10 NOTE — Pre-Procedure Instructions (Signed)
Patient is scheduled for procedure Dec 17 with anesthesia.  Anesthesia requires Ozemic to be held for 7 days prior to the procedure.  Last dose can be tomorrow.  Don't take any after tomorrow until after the procedure.

## 2023-04-11 ENCOUNTER — Encounter: Payer: Self-pay | Admitting: Physician Assistant

## 2023-04-14 ENCOUNTER — Emergency Department: Payer: Medicaid Other

## 2023-04-14 ENCOUNTER — Encounter: Payer: Self-pay | Admitting: Emergency Medicine

## 2023-04-14 ENCOUNTER — Other Ambulatory Visit: Payer: Self-pay

## 2023-04-14 DIAGNOSIS — I5043 Acute on chronic combined systolic (congestive) and diastolic (congestive) heart failure: Secondary | ICD-10-CM | POA: Diagnosis present

## 2023-04-14 DIAGNOSIS — E66811 Obesity, class 1: Secondary | ICD-10-CM | POA: Diagnosis present

## 2023-04-14 DIAGNOSIS — Z833 Family history of diabetes mellitus: Secondary | ICD-10-CM

## 2023-04-14 DIAGNOSIS — N182 Chronic kidney disease, stage 2 (mild): Secondary | ICD-10-CM | POA: Diagnosis present

## 2023-04-14 DIAGNOSIS — Z91148 Patient's other noncompliance with medication regimen for other reason: Secondary | ICD-10-CM

## 2023-04-14 DIAGNOSIS — I445 Left posterior fascicular block: Secondary | ICD-10-CM | POA: Diagnosis present

## 2023-04-14 DIAGNOSIS — Z6831 Body mass index (BMI) 31.0-31.9, adult: Secondary | ICD-10-CM

## 2023-04-14 DIAGNOSIS — I5082 Biventricular heart failure: Secondary | ICD-10-CM | POA: Diagnosis present

## 2023-04-14 DIAGNOSIS — Z8249 Family history of ischemic heart disease and other diseases of the circulatory system: Secondary | ICD-10-CM

## 2023-04-14 DIAGNOSIS — I428 Other cardiomyopathies: Secondary | ICD-10-CM | POA: Diagnosis present

## 2023-04-14 DIAGNOSIS — D509 Iron deficiency anemia, unspecified: Secondary | ICD-10-CM | POA: Diagnosis present

## 2023-04-14 DIAGNOSIS — I2510 Atherosclerotic heart disease of native coronary artery without angina pectoris: Secondary | ICD-10-CM | POA: Diagnosis present

## 2023-04-14 DIAGNOSIS — F1410 Cocaine abuse, uncomplicated: Secondary | ICD-10-CM | POA: Diagnosis present

## 2023-04-14 DIAGNOSIS — F17210 Nicotine dependence, cigarettes, uncomplicated: Secondary | ICD-10-CM | POA: Diagnosis present

## 2023-04-14 DIAGNOSIS — K219 Gastro-esophageal reflux disease without esophagitis: Secondary | ICD-10-CM | POA: Diagnosis present

## 2023-04-14 DIAGNOSIS — Z5941 Food insecurity: Secondary | ICD-10-CM

## 2023-04-14 DIAGNOSIS — E1122 Type 2 diabetes mellitus with diabetic chronic kidney disease: Secondary | ICD-10-CM | POA: Diagnosis present

## 2023-04-14 DIAGNOSIS — E785 Hyperlipidemia, unspecified: Secondary | ICD-10-CM | POA: Diagnosis present

## 2023-04-14 DIAGNOSIS — Z79899 Other long term (current) drug therapy: Secondary | ICD-10-CM

## 2023-04-14 DIAGNOSIS — I272 Pulmonary hypertension, unspecified: Secondary | ICD-10-CM | POA: Diagnosis present

## 2023-04-14 DIAGNOSIS — I130 Hypertensive heart and chronic kidney disease with heart failure and stage 1 through stage 4 chronic kidney disease, or unspecified chronic kidney disease: Principal | ICD-10-CM | POA: Diagnosis present

## 2023-04-14 DIAGNOSIS — Z5986 Financial insecurity: Secondary | ICD-10-CM

## 2023-04-14 DIAGNOSIS — Z5982 Transportation insecurity: Secondary | ICD-10-CM

## 2023-04-14 LAB — CBC
HCT: 30.9 % — ABNORMAL LOW (ref 36.0–46.0)
Hemoglobin: 9.8 g/dL — ABNORMAL LOW (ref 12.0–15.0)
MCH: 31.3 pg (ref 26.0–34.0)
MCHC: 31.7 g/dL (ref 30.0–36.0)
MCV: 98.7 fL (ref 80.0–100.0)
Platelets: 341 10*3/uL (ref 150–400)
RBC: 3.13 MIL/uL — ABNORMAL LOW (ref 3.87–5.11)
RDW: 14.4 % (ref 11.5–15.5)
WBC: 6.9 10*3/uL (ref 4.0–10.5)
nRBC: 0 % (ref 0.0–0.2)

## 2023-04-14 NOTE — ED Triage Notes (Signed)
Patient ambulatory to triage with complaints of feeling like she has too much fluid on her. States she is supposed to have pacemaker placement on the 17th at [facility]. Patient is supposed to be on lasix but states she was kicked out of her house so she has not taken them in a week. Endorses shortness of breath and leg swelling.

## 2023-04-15 ENCOUNTER — Encounter: Payer: Self-pay | Admitting: Family Medicine

## 2023-04-15 ENCOUNTER — Inpatient Hospital Stay
Admission: EM | Admit: 2023-04-15 | Discharge: 2023-04-20 | DRG: 291 | Disposition: A | Discharge status: Home / Self Care | Payer: Medicaid Other | Attending: Obstetrics and Gynecology | Admitting: Obstetrics and Gynecology

## 2023-04-15 DIAGNOSIS — Z5986 Financial insecurity: Secondary | ICD-10-CM | POA: Diagnosis not present

## 2023-04-15 DIAGNOSIS — Z6831 Body mass index (BMI) 31.0-31.9, adult: Secondary | ICD-10-CM | POA: Diagnosis not present

## 2023-04-15 DIAGNOSIS — I5021 Acute systolic (congestive) heart failure: Secondary | ICD-10-CM | POA: Diagnosis not present

## 2023-04-15 DIAGNOSIS — I272 Pulmonary hypertension, unspecified: Secondary | ICD-10-CM | POA: Diagnosis present

## 2023-04-15 DIAGNOSIS — I5020 Unspecified systolic (congestive) heart failure: Secondary | ICD-10-CM

## 2023-04-15 DIAGNOSIS — D649 Anemia, unspecified: Secondary | ICD-10-CM | POA: Diagnosis present

## 2023-04-15 DIAGNOSIS — I509 Heart failure, unspecified: Principal | ICD-10-CM

## 2023-04-15 DIAGNOSIS — E785 Hyperlipidemia, unspecified: Secondary | ICD-10-CM | POA: Diagnosis present

## 2023-04-15 DIAGNOSIS — Z91148 Patient's other noncompliance with medication regimen for other reason: Secondary | ICD-10-CM | POA: Diagnosis not present

## 2023-04-15 DIAGNOSIS — Z833 Family history of diabetes mellitus: Secondary | ICD-10-CM | POA: Diagnosis not present

## 2023-04-15 DIAGNOSIS — F1410 Cocaine abuse, uncomplicated: Secondary | ICD-10-CM | POA: Diagnosis present

## 2023-04-15 DIAGNOSIS — E119 Type 2 diabetes mellitus without complications: Secondary | ICD-10-CM

## 2023-04-15 DIAGNOSIS — I428 Other cardiomyopathies: Secondary | ICD-10-CM | POA: Diagnosis present

## 2023-04-15 DIAGNOSIS — I5043 Acute on chronic combined systolic (congestive) and diastolic (congestive) heart failure: Secondary | ICD-10-CM | POA: Diagnosis present

## 2023-04-15 DIAGNOSIS — Z5982 Transportation insecurity: Secondary | ICD-10-CM | POA: Diagnosis not present

## 2023-04-15 DIAGNOSIS — I5023 Acute on chronic systolic (congestive) heart failure: Secondary | ICD-10-CM

## 2023-04-15 DIAGNOSIS — D509 Iron deficiency anemia, unspecified: Secondary | ICD-10-CM | POA: Diagnosis present

## 2023-04-15 DIAGNOSIS — E1122 Type 2 diabetes mellitus with diabetic chronic kidney disease: Secondary | ICD-10-CM | POA: Diagnosis present

## 2023-04-15 DIAGNOSIS — I2510 Atherosclerotic heart disease of native coronary artery without angina pectoris: Secondary | ICD-10-CM

## 2023-04-15 DIAGNOSIS — E1129 Type 2 diabetes mellitus with other diabetic kidney complication: Secondary | ICD-10-CM

## 2023-04-15 DIAGNOSIS — I10 Essential (primary) hypertension: Secondary | ICD-10-CM | POA: Diagnosis not present

## 2023-04-15 DIAGNOSIS — F17210 Nicotine dependence, cigarettes, uncomplicated: Secondary | ICD-10-CM | POA: Diagnosis present

## 2023-04-15 DIAGNOSIS — E66811 Obesity, class 1: Secondary | ICD-10-CM | POA: Diagnosis present

## 2023-04-15 DIAGNOSIS — N182 Chronic kidney disease, stage 2 (mild): Secondary | ICD-10-CM | POA: Diagnosis present

## 2023-04-15 DIAGNOSIS — I130 Hypertensive heart and chronic kidney disease with heart failure and stage 1 through stage 4 chronic kidney disease, or unspecified chronic kidney disease: Secondary | ICD-10-CM | POA: Diagnosis present

## 2023-04-15 DIAGNOSIS — K219 Gastro-esophageal reflux disease without esophagitis: Secondary | ICD-10-CM | POA: Diagnosis present

## 2023-04-15 DIAGNOSIS — Z8249 Family history of ischemic heart disease and other diseases of the circulatory system: Secondary | ICD-10-CM | POA: Diagnosis not present

## 2023-04-15 DIAGNOSIS — Z79899 Other long term (current) drug therapy: Secondary | ICD-10-CM | POA: Diagnosis not present

## 2023-04-15 DIAGNOSIS — Z5941 Food insecurity: Secondary | ICD-10-CM | POA: Diagnosis not present

## 2023-04-15 DIAGNOSIS — I5082 Biventricular heart failure: Secondary | ICD-10-CM | POA: Diagnosis present

## 2023-04-15 DIAGNOSIS — I445 Left posterior fascicular block: Secondary | ICD-10-CM | POA: Diagnosis present

## 2023-04-15 LAB — BASIC METABOLIC PANEL
Anion gap: 7 (ref 5–15)
Anion gap: 9 (ref 5–15)
BUN: 23 mg/dL — ABNORMAL HIGH (ref 6–20)
BUN: 25 mg/dL — ABNORMAL HIGH (ref 6–20)
CO2: 24 mmol/L (ref 22–32)
CO2: 24 mmol/L (ref 22–32)
Calcium: 7.3 mg/dL — ABNORMAL LOW (ref 8.9–10.3)
Calcium: 7.6 mg/dL — ABNORMAL LOW (ref 8.9–10.3)
Chloride: 102 mmol/L (ref 98–111)
Chloride: 103 mmol/L (ref 98–111)
Creatinine, Ser: 1.09 mg/dL — ABNORMAL HIGH (ref 0.44–1.00)
Creatinine, Ser: 1.24 mg/dL — ABNORMAL HIGH (ref 0.44–1.00)
GFR, Estimated: 57 mL/min — ABNORMAL LOW (ref >60)
GFR, Estimated: >60 mL/min (ref >60)
Glucose, Bld: 167 mg/dL — ABNORMAL HIGH (ref 70–99)
Glucose, Bld: 210 mg/dL — ABNORMAL HIGH (ref 70–99)
Potassium: 3.9 mmol/L (ref 3.5–5.1)
Potassium: 4.2 mmol/L (ref 3.5–5.1)
Sodium: 133 mmol/L — ABNORMAL LOW (ref 135–145)
Sodium: 136 mmol/L (ref 135–145)

## 2023-04-15 LAB — IRON AND TIBC
Iron: 22 ug/dL — ABNORMAL LOW (ref 28–170)
Saturation Ratios: 9 % — ABNORMAL LOW (ref 10.4–31.8)
TIBC: 242 ug/dL — ABNORMAL LOW (ref 250–450)
UIBC: 220 ug/dL

## 2023-04-15 LAB — RETICULOCYTES
Immature Retic Fract: 26 % — ABNORMAL HIGH (ref 2.3–15.9)
RBC.: 2.82 MIL/uL — ABNORMAL LOW (ref 3.87–5.11)
Retic Count, Absolute: 91.9 10*3/uL (ref 19.0–186.0)
Retic Ct Pct: 3.3 % — ABNORMAL HIGH (ref 0.4–3.1)

## 2023-04-15 LAB — CBC
HCT: 27 % — ABNORMAL LOW (ref 36.0–46.0)
Hemoglobin: 8.7 g/dL — ABNORMAL LOW (ref 12.0–15.0)
MCH: 31 pg (ref 26.0–34.0)
MCHC: 32.2 g/dL (ref 30.0–36.0)
MCV: 96.1 fL (ref 80.0–100.0)
Platelets: 316 10*3/uL (ref 150–400)
RBC: 2.81 MIL/uL — ABNORMAL LOW (ref 3.87–5.11)
RDW: 14.2 % (ref 11.5–15.5)
WBC: 6.2 10*3/uL (ref 4.0–10.5)
nRBC: 0 % (ref 0.0–0.2)

## 2023-04-15 LAB — TROPONIN I (HIGH SENSITIVITY)
Troponin I (High Sensitivity): 20 ng/L — ABNORMAL HIGH (ref <18)
Troponin I (High Sensitivity): 20 ng/L — ABNORMAL HIGH (ref <18)

## 2023-04-15 LAB — BRAIN NATRIURETIC PEPTIDE: B Natriuretic Peptide: 1519.4 pg/mL — ABNORMAL HIGH (ref 0.0–100.0)

## 2023-04-15 LAB — FOLATE: Folate: 9.4 ng/mL (ref >5.9)

## 2023-04-15 LAB — LACTATE DEHYDROGENASE: LDH: 216 U/L — ABNORMAL HIGH (ref 98–192)

## 2023-04-15 LAB — FERRITIN: Ferritin: 44 ng/mL (ref 11–307)

## 2023-04-15 LAB — POC URINE PREG, ED: Preg Test, Ur: NEGATIVE — NL

## 2023-04-15 MED ORDER — FUROSEMIDE 10 MG/ML IJ SOLN
60.0000 mg | Freq: Once | INTRAMUSCULAR | Status: AC
  Administered 2023-04-15: 60 mg via INTRAVENOUS
  Filled 2023-04-15: qty 8

## 2023-04-15 MED ORDER — ENOXAPARIN SODIUM 60 MG/0.6ML IJ SOSY
45.0000 mg | PREFILLED_SYRINGE | INTRAMUSCULAR | Status: DC
  Filled 2023-04-15 (×5): qty 0.6

## 2023-04-15 MED ORDER — ONDANSETRON HCL 4 MG PO TABS: 4.0000 mg | ORAL_TABLET | Freq: Four times a day (QID) | ORAL | Status: DC | PRN

## 2023-04-15 MED ORDER — DAPAGLIFLOZIN PROPANEDIOL 10 MG PO TABS
10.0000 mg | ORAL_TABLET | Freq: Every day | ORAL | Status: DC
  Administered 2023-04-18 – 2023-04-20 (×3): 10 mg via ORAL
  Filled 2023-04-15 (×6): qty 1

## 2023-04-15 MED ORDER — ATORVASTATIN CALCIUM 20 MG PO TABS
20.0000 mg | ORAL_TABLET | Freq: Every day | ORAL | Status: DC
  Administered 2023-04-15 – 2023-04-20 (×6): 20 mg via ORAL
  Filled 2023-04-15 (×5): qty 1
  Filled 2023-04-15: qty 2

## 2023-04-15 MED ORDER — ACETAMINOPHEN 325 MG PO TABS
650.0000 mg | ORAL_TABLET | Freq: Four times a day (QID) | ORAL | Status: DC | PRN
  Administered 2023-04-16: 650 mg via ORAL
  Filled 2023-04-15: qty 2

## 2023-04-15 MED ORDER — ACETAMINOPHEN 650 MG RE SUPP: 650.0000 mg | Freq: Four times a day (QID) | RECTAL | Status: DC | PRN

## 2023-04-15 MED ORDER — GUAIFENESIN-DM 100-10 MG/5ML PO SYRP
5.0000 mL | ORAL_SOLUTION | ORAL | Status: DC | PRN
  Administered 2023-04-15 – 2023-04-17 (×4): 5 mL via ORAL
  Filled 2023-04-15 (×4): qty 10

## 2023-04-15 MED ORDER — DIGOXIN 125 MCG PO TABS
0.0625 mg | ORAL_TABLET | Freq: Every day | ORAL | Status: DC
  Administered 2023-04-15 – 2023-04-20 (×6): 0.0625 mg via ORAL
  Filled 2023-04-15 (×6): qty 0.5

## 2023-04-15 MED ORDER — MAGNESIUM HYDROXIDE 400 MG/5ML PO SUSP
30.0000 mL | Freq: Every day | ORAL | Status: DC | PRN
Start: 2023-04-15 — End: 2023-04-20

## 2023-04-15 MED ORDER — ONDANSETRON HCL 4 MG/2ML IJ SOLN: 4.0000 mg | Freq: Four times a day (QID) | INTRAMUSCULAR | Status: DC | PRN

## 2023-04-15 MED ORDER — FUROSEMIDE 10 MG/ML IJ SOLN
40.0000 mg | Freq: Two times a day (BID) | INTRAMUSCULAR | Status: DC
  Administered 2023-04-15 – 2023-04-16 (×3): 40 mg via INTRAVENOUS
  Filled 2023-04-15 (×3): qty 4

## 2023-04-15 MED ORDER — TRAZODONE HCL 50 MG PO TABS: 25.0000 mg | ORAL_TABLET | Freq: Every evening | ORAL | Status: DC | PRN

## 2023-04-15 MED ORDER — CARVEDILOL 6.25 MG PO TABS
6.2500 mg | ORAL_TABLET | Freq: Two times a day (BID) | ORAL | Status: DC
  Administered 2023-04-15 – 2023-04-20 (×11): 6.25 mg via ORAL
  Filled 2023-04-15 (×11): qty 1

## 2023-04-15 MED ORDER — SACUBITRIL-VALSARTAN 97-103 MG PO TABS
1.0000 | ORAL_TABLET | Freq: Two times a day (BID) | ORAL | Status: DC
Start: 2023-04-15 — End: 2023-04-20
  Administered 2023-04-15 – 2023-04-20 (×11): 1 via ORAL
  Filled 2023-04-15 (×12): qty 1

## 2023-04-15 NOTE — Assessment & Plan Note (Signed)
-   This is likely contributing to her acute CHF. - Anemia workup will be obtained.

## 2023-04-15 NOTE — ED Notes (Signed)
Sandwich tray given. Lotion and deodorant provided at pt request.

## 2023-04-15 NOTE — Assessment & Plan Note (Signed)
-   The patient will be placed on supplemental coverage with NovoLog. - She has been continued on Farxiga.

## 2023-04-15 NOTE — Assessment & Plan Note (Addendum)
-  The patient will be admitted to a cardiac telemetry bed. - We will continue diuresis with IV Lasix. - We will continue Coreg and resume Entresto as well as [place] and Lanoxin. - We Will follow serial troponins. - We will follow I's and O's and daily weights. - Cardiology consult be obtained. - I notified Dr. Duke Salvia about the patient.

## 2023-04-15 NOTE — Progress Notes (Signed)
Heart Failure Nurse Navigator Progress Note  PCP: Margarita Mail, DO PCP-Cardiologist: Debbe Odea, MD Admission Diagnosis: Congestive heart failure, unspecified HF chronicity, unspecified heart failure type Admitted from: Recently displaced from her home-living @ [employer]  Presentation:   Molly Howard presented with swelling and shortness of breath. BNP 1,519.4. Troponin I was 20. CXR on admission with mild interstitial edema and vascular congestion.  ECHO/ LVEF: 07/25/22 (25-30%)  Clinical Course:  Past Medical History:  Diagnosis Date   Acid reflux    Chronic HFrEF (heart failure with reduced ejection fraction) (HCC)    a. 10/2019 Echo: EF 35-40%, GrII DD; b. 05/2020 Echo: EF 20-25%, glob HK; c. 10/2020 Echo: EF 30-35%, glob HK. GrII DD, Mildly red RV fxn. Mod TR; d.  05/2021 cMRI: EF 42%, no LGE. Nl RV size/fxn.   CKD (chronic kidney disease), stage II    Diabetes mellitus (HCC)    H/O medication noncompliance    Hypertension    Microcytic anemia    NICM (nonischemic cardiomyopathy) (HCC)    a. 10/2019 Echo: EF 35-40%; b. 10/2019 MV: No ischemia. Small apical defect-->breast attenuation; c. 05/2020 Echo: EF 20-25%; d. 10/2020 Echo: EF 30-35%, glob HK. GrII DD, Mildly red RV fxn. Mod TR; e. 05/2021 cMRI: EF 42%, no LGE. Nl RV size/fxn.   Obesity    Polysubstance abuse (HCC)      Social History   Socioeconomic History   Marital status: Single    Spouse name: Not on file   Number of children: 0   Years of education: Not on file   Highest education level: 12th grade  Occupational History   Occupation: Diaabled  Tobacco Use   Smoking status: Former    Current packs/day: 0.00    Types: Cigarettes    Quit date: 2022    Years since quitting: 2.9   Smokeless tobacco: Never  Vaping Use   Vaping status: Never Used  Substance and Sexual Activity   Alcohol use: Not Currently   Drug use: Not Currently    Types: Cocaine    Comment: Admits to using cocaine up and will July  2024.   Sexual activity: Not Currently    Birth control/protection: None  Other Topics Concern   Not on file  Social History Narrative   Lives locally.  Does not routinely exercise.  Has been using cocaine.   Social Drivers of Corporate investment banker Strain: High Risk (04/15/2023)   Overall Financial Resource Strain (CARDIA)    Difficulty of Paying Living Expenses: Very hard  Food Insecurity: Food Insecurity Present (04/04/2023)   Hunger Vital Sign    Worried About Running Out of Food in the Last Year: Sometimes true    Ran Out of Food in the Last Year: Sometimes true  Transportation Needs: No Transportation Needs (04/15/2023)   PRAPARE - Administrator, Civil Service (Medical): No    Lack of Transportation (Non-Medical): No  Physical Activity: Not on file  Stress: Not on file  Social Connections: Not on file   Education Assessment and Provision:  Detailed education and instructions provided on heart failure disease management including the following:  Signs and symptoms of Heart Failure When to call the physician Importance of daily weights Low sodium diet Fluid restriction Medication management Anticipated future follow-up appointments  Patient education given on each of the above topics.  Patient acknowledges understanding via teach back method and acceptance of all instructions.  Education Materials:  "Living Better With  Heart Failure" Booklet, HF zone tool, & Daily Weight Tracker Tool.  Patient has scale at home: Yes-but no access to it right now. Patient has pill box at home: Yes-but no access to home meds right now.    High Risk Criteria for Readmission and/or Poor Patient Outcomes: Heart failure hospital admissions (last 6 months): 2  No Show rate: 21 Difficult social situation: Housing & Medication concerns Demonstrates medication adherence: Not right now.  Medications are locked in trailer that she was recently evicted from. Primary Language:  English Literacy level: Reading, Writing and Comprehension  Barriers of Care:  Diet & Fluid Restrictions Daily Weights Medication Compliance  Considerations/Referrals:   Referral made to Heart Failure Pharmacist Stewardship: Yes Referral made to CSW/NCM TOC: Yes for housing  Referral made to Heart & Vascular TOC clinic: Yes-Established Patient TOC scheduled.  Per patient she will arrange transportation to appointment on 04/22/23 @ 3:30 PM  Items for Follow-up on DC/TOC: Diet & Fluid Restrictions Daily Weights-Scale is locked in home she was evicted from.  Make sure she was able to get that at follow-up visit. Medication Compliance-has been unable to obtain her medications from home she was living in.  Continued Heart Failure Education  Roxy Horseman, RN, BSN [facility] Heart Failure Navigator Secure Chat Only
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
## 2023-04-15 NOTE — Assessment & Plan Note (Signed)
-   We will continue anti-hypertensive therapy. 

## 2023-04-15 NOTE — Consult Note (Signed)
Cardiology Consultation   Patient ID: ADEJA BERINGER MRN: [id number]; DOB: 1985-11-14  Admit date: 04/15/2023 Date of Consult: 04/15/2023  PCP:  Margarita Mail, DO   [facility] Providers Cardiologist:  Debbe Odea, MD  Electrophysiologist:  Lanier Prude, MD       Patient Profile:   Molly Howard is a 37 y.o. female with a hx of combined systolic and diastolic CHF, CKD II, T2DM, hypertension, nonischemic cardiomyopathy, GERD, and polysubstance abuse who is being seen 04/15/2023 for the evaluation of CHF exacerbation at the request of Dr. Arville Care.  History of Present Illness:   Molly Howard is a patient of Dr. Azucena Cecil and follows with him for management of combined systolic and diastolic HF, tricuspid regurg, nonobstructive CAD, and HTN. Her cardiomyopathy dates back to 2021 with echo at that time showing an EF of 35 to 40%.  Cardiac MRI in 05/2020 showed an EF of 42%, mild LV dilatation, RVEF 50%, and no delayed enhancement. Echo from 07/2022 showed an EF of 25 to 30%, mildly decreased RV systolic function, PASP 50 mmHg, moderate to severe tricuspid regurgitation, and mild mitral regurgitation. RHC in 07/2022 showed elevated right greater than left heart filling pressures, pulmonary venous hypertension, and moderately reduced cardiac output. She has required ongoing hospital admissions for volume overload requiring aggressive diuresis and augmentation with milrinone. Following discharge in 07/2022, she never followed up with cardiology. She was most recently admitted to the hospital in 11/2022 for volume overload after running out of all of her medications. She continued to use cocaine regularly and was cocaine positive that admission. She was followed by the advanced heart failure service during the admission with symptomatic improvement following aggressive diuresis, net -36 L for the admission. She has been evaluated by the advanced heart failure service and is not deemed  to be a candidate for advanced therapies due to persistently positive tox screens for cocaine and medical nonadherence.   She was last seen by Eula Listen, PA-C on 03/10/23 during which she was doing well from a cardiac perspective. She had no symptoms of angina of cardiac decompensation. She did note intermittent SOB, LE edema, and abdominal distention with weight varying by 5 lb at times. Stable two pillow orthopnea. She reported compliance with her medication. At that time she admitted to drinking 80 oz of beer per day and smoking cigarettes. She denied cocaine use since 11/2022. Mildly volume up on exam. Entresto and torsemide were increased and remaining GDMT was continued.   She was seem by Dr. Lalla Brothers on 03/16/23 for evaluation for ICD placement. She reports some heart rate abnormalities, fluid retention, shortness of breath, and orthopnea. Denied syncope. She did meet criteria for ICD implantation and was scheduled for procedure 04/19/23.   She presented to the ED on the evening of 04/14/23 with increased LE and abdominal swelling and shortness of breath worsening for the past week. She has noted 15 lb weight gain. She reports some sharp chest pain that occurred yesterday. She has had this pain before when she is volume overloaded. She has been without her medications since 03/30/23 when she was kicked out of her house. She has been staying at [facility] in the meantime. Prior to this she was stable. She is scheduled to have a defibrillator placed 04/19/23.   In the ED, BP 153/101, RR 24, and HR 95. Pertinent labs include BUN 25, creatinine 1.24 (near baseline), glucose 210, and calcium 7.6. BNP 1519.4 and troponin 20>>20. EKG without  ischemic changes. CXR showed cardiomegaly with vascular congestion and mild interstitial edema. The patient was given IV lasix.    Past Medical History:  Diagnosis Date   Acid reflux    Chronic HFrEF (heart failure with reduced ejection fraction) (HCC)    a. 10/2019  Echo: EF 35-40%, GrII DD; b. 05/2020 Echo: EF 20-25%, glob HK; c. 10/2020 Echo: EF 30-35%, glob HK. GrII DD, Mildly red RV fxn. Mod TR; d.  05/2021 cMRI: EF 42%, no LGE. Nl RV size/fxn.   CKD (chronic kidney disease), stage II    Diabetes mellitus (HCC)    H/O medication noncompliance    Hypertension    Microcytic anemia    NICM (nonischemic cardiomyopathy) (HCC)    a. 10/2019 Echo: EF 35-40%; b. 10/2019 MV: No ischemia. Small apical defect-->breast attenuation; c. 05/2020 Echo: EF 20-25%; d. 10/2020 Echo: EF 30-35%, glob HK. GrII DD, Mildly red RV fxn. Mod TR; e. 05/2021 cMRI: EF 42%, no LGE. Nl RV size/fxn.   Obesity    Polysubstance abuse [facility])     Past Surgical History:  Procedure Laterality Date   CHOLECYSTECTOMY     HERNIA REPAIR     RIGHT HEART CATH N/A 07/28/2022   Procedure: RIGHT HEART CATH;  Surgeon: Iran Ouch, MD;  Location: [facility] INVASIVE CV LAB;  Service: Cardiovascular;  Laterality: N/A;     Home Medications:  Prior to Admission medications   Medication Sig Start Date End Date Taking? Authorizing Provider  Accu-Chek Softclix Lancets lancets Use as instructed Patient not taking: Reported on 04/04/2023 11/18/22   Margarita Mail, DO  atorvastatin (LIPITOR) 20 MG tablet Take 1 tablet (20 mg total) by mouth daily. Patient not taking: Reported on 04/04/2023 02/04/23   Sondra Barges, PA-C  carvedilol (COREG) 6.25 MG tablet Take 1 tablet (6.25 mg total) by mouth 2 (two) times daily with a meal. Patient not taking: Reported on 04/04/2023 02/04/23   Sondra Barges, PA-C  Continuous Glucose Sensor (FREESTYLE LIBRE 3 SENSOR) MISC Place 1 sensor on the skin every 14 days. Use to check glucose continuously Patient not taking: Reported on 04/04/2023 01/14/23   Margarita Mail, DO  dapagliflozin propanediol (FARXIGA) 10 MG TABS tablet Take 1 tablet (10 mg total) by mouth once daily. Patient not taking: Reported on 04/04/2023 02/17/23   Margarita Mail, DO  digoxin (LANOXIN) 0.125 MG  tablet Take 0.5 tablets (0.0625 mg total) by mouth daily. Patient not taking: Reported on 04/04/2023 12/23/22   Sondra Barges, PA-C  sacubitril-valsartan (ENTRESTO) 97-103 MG Take 1 tablet by mouth 2 (two) times daily. Patient not taking: Reported on 04/04/2023 03/10/23   Sondra Barges, PA-C  Semaglutide,0.25 or 0.5MG /DOS, (OZEMPIC, 0.25 OR 0.5 MG/DOSE,) 2 MG/3ML SOPN Inject 0.5 mg into the skin once a week. Patient not taking: Reported on 04/04/2023 02/17/23   Margarita Mail, DO  torsemide (DEMADEX) 20 MG tablet Take 1 tablet (20 mg total) by mouth 2 (two) times daily. 04/04/23   Dunn, Raymon Mutton, PA-C  Potassium Chloride 40 MEQ/15ML (20%) SOLN Take 40 mEq by mouth 2 (two) times daily. 07/04/20 10/18/20  Debbe Odea, MD    Inpatient Medications: Scheduled Meds:  atorvastatin  20 mg Oral Daily   carvedilol  6.25 mg Oral BID WC   dapagliflozin propanediol  10 mg Oral Daily   digoxin  0.0625 mg Oral Daily   enoxaparin (LOVENOX) injection  45 mg Subcutaneous Q24H   furosemide  40 mg Intravenous Q12H   sacubitril-valsartan  1  tablet Oral BID   Continuous Infusions:  PRN Meds: acetaminophen **OR** acetaminophen, guaiFENesin-dextromethorphan, magnesium hydroxide, ondansetron **OR** ondansetron (ZOFRAN) IV, traZODone  Allergies:    Allergies  Allergen Reactions   No [employer] Food Allergies Rash and Other (See Comments)    Lemons    Social History:   Social History   Socioeconomic History   Marital status: Single    Spouse name: Not on file   Number of children: Not on file   Years of education: Not on file   Highest education level: Not on file  Occupational History   Not on file  Tobacco Use   Smoking status: Former    Current packs/day: 0.00    Types: Cigarettes    Quit date: 01-May-2021    Years since quitting: 2.9   Smokeless tobacco: Never  Vaping Use   Vaping status: Never Used  Substance and Sexual Activity   Alcohol use: Not Currently    Comment: ~ 4 shots/day on  weekends only   Drug use: Not Currently    Types: Cocaine    Comment: Admits to using cocaine up and will February 2024.   Sexual activity: Not Currently    Birth control/protection: None  Other Topics Concern   Not on file  Social History Narrative   Lives locally.  Does not routinely exercise.  Has been using cocaine.   Social Drivers of Corporate investment banker Strain: High Risk (06/16/2020)   Overall Financial Resource Strain (CARDIA)    Difficulty of Paying Living Expenses: Hard  Food Insecurity: Food Insecurity Present (04/04/2023)   Hunger Vital Sign    Worried About Running Out of Food in the Last Year: Sometimes true    Ran Out of Food in the Last Year: Sometimes true  Transportation Needs: Unmet Transportation Needs (11/24/2022)   PRAPARE - Administrator, Civil Service (Medical): Yes    Lack of Transportation (Non-Medical): Yes  Physical Activity: Not on file  Stress: Not on file  Social Connections: Not on file  Intimate Partner Violence: Not At Risk (11/06/2022)   Humiliation, Afraid, Rape, and Kick questionnaire    Fear of Current or Ex-Partner: No    Emotionally Abused: No    Physically Abused: No    Sexually Abused: No    Family History:    Family History  Problem Relation Age of Onset   Heart failure Mother        Onset of heart failure 40s.  Died in 05-01-2022.   Diabetes Mother    Hypertension Father    Diabetes Father      ROS:  Please see the history of present illness.   Physical Exam/Data:   Vitals:   04/15/23 0600 04/15/23 0615 04/15/23 0645 04/15/23 0759  BP:    (!) 138/90  Pulse: 88 84 84 90  Resp: (!) 27 (!) 24 13 (!) 23  Temp:    97.9 F (36.6 C)  TempSrc:    Oral  SpO2: 97% 93% 98% 99%  Weight:      Height:        Intake/Output Summary (Last 24 hours) at 04/15/2023 0806 Last data filed at 04/15/2023 0534 Gross per 24 hour  Intake --  Output 1350 ml  Net -1350 ml      04/14/2023   11:27 PM 03/24/2023     3:09 PM 03/16/2023    3:52 PM  Last 3 Weights  Weight (lbs) 202 lb 13.2 oz 202 lb 4  oz 205 lb 3.2 oz  Weight (kg) 92 kg 91.74 kg 93.078 kg     Body mass index is 31.77 kg/m.  General:  Well nourished, well developed, in no acute distress HEENT: normal Neck: Unable to evaluate JVD Cardiac:  normal S1, S2; RRR; no murmur  Lungs:  clear to auscultation bilaterally, no wheezing, rhonchi or rales  Abd: moderately distended, soft, nontender Ext: LE edema present Skin: warm and dry  Psych:  Normal affect   EKG:  The EKG was personally reviewed and demonstrates:  Sinus rhythm rate 96 with bi-atrial enlargement and LPFB Telemetry:  Telemetry was personally reviewed and demonstrates:  sinus rhythm rate 80-90  Relevant CV Studies:  Zio patch 12/2022: Patient had a min HR of 62 bpm, max HR of 187 bpm, and avg HR of 76 bpm. Predominant underlying rhythm was Sinus Rhythm. 24 Ventricular Tachycardia runs occurred, the run with the fastest interval lasting 7 beats with a max rate of 187 bpm, the longest  lasting 13 beats with an avg rate of 128 bpm. 1 run of Supraventricular Tachycardia occurred lasting 6 beats with a max rate of 135 bpm (avg 116 bpm). Isolated SVEs were rare (<1.0%), SVE Couplets were rare (<1.0%), and SVE Triplets were rare (<1.0%).  Isolated VEs were rare (<1.0%, 1371), VE Couplets were rare (<1.0%, 120), and VE Triplets were rare (<1.0%, 26). Ventricular Bigeminy was present.    Conclusion Occasional nonsustained VT. 1 episode of paroxysmal SVT. No sustained arrhythmias. No atrial fibrillation or atrial flutter. __________   RHC 07/28/2022: Successful right heart catheterization via the right antecubital vein. Severely elevated right and left-sided filling pressures, moderate pulmonary hypertension and moderately reduced cardiac output.   RA: 30 mmHg RV: 56/14 mmHg PW: 34 mmHg PA: 54/30 with a mean of 41 mmHg PA sat is 42% Cardiac output: 4.47 with a cardiac index of  2. CPO is 0.89 PAPI: 0.8   Recommendations: The patient is severely volume overloaded with evidence of biventricular failure right more than left by hemodynamics. Recommend starting milrinone drip and furosemide drip.  Will transfer to stepdown unit. __________   2D echo 07/25/2022: 1. Left ventricular ejection fraction, by estimation, is 25 to 30%. Left  ventricular ejection fraction by 2D MOD biplane is 21.6 %. The left  ventricle has severely decreased function. The left ventricle demonstrates  global hypokinesis. Left ventricular  diastolic parameters are indeterminate.   2. Right ventricular systolic function is mildly reduced. The right  ventricular size is normal. There is moderately elevated pulmonary artery  systolic pressure. The estimated right ventricular systolic pressure is  50.7 mmHg.   3. Left atrial size was mildly dilated.   4. A small pericardial effusion is present. The pericardial effusion is  circumferential. There is no evidence of cardiac tamponade.   5. The aortic valve is tricuspid. Aortic valve regurgitation is not  visualized. No aortic stenosis is present.   6. The mitral valve is normal in structure. Mild mitral valve  regurgitation. No evidence of mitral stenosis.   7. Tricuspid valve regurgitation is moderate to severe.  __________   Limited echo 09/18/2021: 1. Left ventricular ejection fraction, by estimation, is 25 to 30%. Left  ventricular ejection fraction by 3D volume is 28 %. The left ventricle has  severely decreased function. The left ventricle demonstrates global  hypokinesis. Left ventricular  diastolic parameters are consistent with Grade II diastolic dysfunction  (pseudonormalization). The average left ventricular global longitudinal  strain is -5.9 %. The  global longitudinal strain is abnormal.   2. Right ventricular systolic function is mildly reduced. The right  ventricular size is mildly enlarged. Tricuspid regurgitation signal is   inadequate for assessing PA pressure.   3. The mitral valve is normal in structure. No evidence of mitral valve  regurgitation. No evidence of mitral stenosis.   4. The aortic valve is tricuspid. Aortic valve regurgitation is not  visualized. No aortic stenosis is present.   5. The inferior vena cava is normal in size with greater than 50%  respiratory variability, suggesting right atrial pressure of 3 mmHg.  __________   2D echo 06/30/2021: 1. Left ventricular ejection fraction, by estimation, is 30 to 35%. The  left ventricle has moderately decreased function. The left ventricle  demonstrates global hypokinesis. The left ventricular internal cavity size  was mildly dilated. Left ventricular  diastolic parameters are consistent with Grade II diastolic dysfunction  (pseudonormalization).   2. Right ventricular systolic function is normal. The right ventricular  size is normal. Tricuspid regurgitation signal is inadequate for assessing  PA pressure.   3. The mitral valve is normal in structure. Mild mitral valve  regurgitation. No evidence of mitral stenosis.   4. The aortic valve is tricuspid. Aortic valve regurgitation is not  visualized. No aortic stenosis is present.   5. The inferior vena cava is normal in size with greater than 50%  respiratory variability, suggesting right atrial pressure of 3 mmHg.  __________   Cardiac MRI 05/19/2021: IMPRESSION: 1. Mild LV dilatation, mild hypertrophy, and mild systolic dysfunction (EF 42%)  2.  No late gadolinium enhancement to suggest myocardial scar 3.  Normal RV size and systolic function (EF 50%) ___________   2D echo 03/20/2021: 1. Left ventricular ejection fraction, by estimation, is 25 to 30%. The  left ventricle has severely decreased function. The left ventricle  demonstrates global hypokinesis. Left ventricular diastolic parameters are  consistent with Grade II diastolic  dysfunction (pseudonormalization). The average left  ventricular global  longitudinal strain is -9.2 %. The global longitudinal strain is abnormal.   2. Right ventricular systolic function is mildly reduced. The right  ventricular size is mildly enlarged. There is severely elevated pulmonary  artery systolic pressure. The estimated right ventricular systolic  pressure is 70.8 mmHg.   3. Left atrial size was mildly dilated.   4. The mitral valve is normal in structure. Mild mitral valve  regurgitation. No evidence of mitral stenosis.   5. Tricuspid valve regurgitation is mild to moderate.   6. The inferior vena cava is dilated in size with <50% respiratory  variability, suggesting right atrial pressure of 15 mmHg.  __________   2D echo 10/11/2020: 1. Left ventricular ejection fraction, by estimation, is 30 to 35%. The  left ventricle has moderate to severely decreased function. The left  ventricle demonstrates global hypokinesis. Left ventricular diastolic  parameters are consistent with Grade II  diastolic dysfunction (pseudonormalization).   2. Right ventricular systolic function is mildly reduced. The right  ventricular size is normal.   3. A small pericardial effusion is present.   4. The mitral valve is normal in structure. No evidence of mitral valve  regurgitation.   5. Tricuspid valve regurgitation is moderate.   6. The aortic valve is tricuspid. Aortic valve regurgitation is not  visualized.  __________   2D echo 05/20/2020: 1. Left ventricular ejection fraction, by estimation, is 20 to 25%. The  left ventricle has severely decreased function. The left ventricle  demonstrates global  hypokinesis. The left ventricular internal cavity size  was mildly dilated. Indeterminate  diastolic filling due to E-A fusion.   2. Right ventricular systolic function is normal. The right ventricular  size is mildly enlarged. There is severely elevated pulmonary artery  systolic pressure.   3. The mitral valve is normal in structure. Trivial  mitral valve  regurgitation. No evidence of mitral stenosis.   4. Tricuspid valve regurgitation is moderate.   5. The aortic valve was not well visualized. Aortic valve regurgitation  is not visualized. No aortic stenosis is present.   6. The inferior vena cava is dilated in size with <50% respiratory  variability, suggesting right atrial pressure of 15 mmHg.  __________   Eugenie Birks MPI 10/05/2019: There was no ST segment deviation noted during stress. T wave inversion was noted during stress in the I and aVL leads. Defect 1: There is a small defect of mild severity present in the apex location. This is likely due to breast attenuation artifact Nuclear stress EF: 30%. This is a high risk study due to low EF. Low EF seems to be due to nonischemic cardiomyopathy. __________   2D echo 10/03/2019: 1. Left ventricular ejection fraction, by estimation, is 35 to 40%. The  left ventricle has moderately decreased function. The left ventricle  demonstrates global hypokinesis. Left ventricular diastolic parameters are  consistent with Grade II diastolic  dysfunction (pseudonormalization).   2. Right ventricular systolic function is normal. The right ventricular  size is normal.   3. The mitral valve is normal in structure. No evidence of mitral valve  regurgitation. No evidence of mitral stenosis.   4. The aortic valve is normal in structure. Aortic valve regurgitation is  not visualized. No aortic stenosis is present.   5. The inferior vena cava is normal in size with greater than 50%  respiratory variability, suggesting right atrial pressure of 3 mmHg.   Laboratory Data:  High Sensitivity Troponin:   Recent Labs  Lab 04/14/23 2334 04/15/23 0127  TROPONINIHS 20* 20*     Chemistry Recent Labs  Lab 04/14/23 2334  NA 136  K 4.2  CL 103  CO2 24  GLUCOSE 210*  BUN 25*  CREATININE 1.24*  CALCIUM 7.6*  GFRNONAA 57*  ANIONGAP 9    No results for input(s): "PROT", "ALBUMIN", "AST",  "ALT", "ALKPHOS", "BILITOT" in the last 168 hours. Lipids No results for input(s): "CHOL", "TRIG", "HDL", "LABVLDL", "LDLCALC", "CHOLHDL" in the last 168 hours.  Hematology Recent Labs  Lab 04/14/23 2334 04/15/23 0509  WBC 6.9 6.2  RBC 3.13* 2.81*  2.82*  HGB 9.8* 8.7*  HCT 30.9* 27.0*  MCV 98.7 96.1  MCH 31.3 31.0  MCHC 31.7 32.2  RDW 14.4 14.2  PLT 341 316   Thyroid No results for input(s): "TSH", "FREET4" in the last 168 hours.  BNP Recent Labs  Lab 04/14/23 2334  BNP 1,519.4*    DDimer No results for input(s): "DDIMER" in the last 168 hours.   Radiology/Studies:  DG Chest 2 View Result Date: 04/14/2023 CLINICAL DATA:  Shortness of breath. EXAM: CHEST - 2 VIEW COMPARISON:  Chest radiograph dated 11/05/2022. FINDINGS: There is mild cardiomegaly with vascular congestion and mild interstitial edema. Pneumonia is not excluded. No consolidative changes. There is no pleural effusion or pneumothorax. No acute osseous pathology. IMPRESSION: Mild cardiomegaly with vascular congestion and mild interstitial edema. Electronically Signed   By: Elgie Collard M.D.   On: 04/14/2023 23:49     Assessment and Plan:  Acute on chronic combined systolic and diastolic CHF - Patient with known CHF presumed to be nonischemic in etiology due to ongoing cocaine abuse and uncontrolled hypertension with intermittent medical compliance, last echo 07/2022 LVEF 25-30% - Has been without home medication since 03/30/2023 - Patient was scheduled for implantation of defibrillator on 04/19/2023 with Dr. Lalla Brothers. He was notified of admission and procedure has been cancelled.  - Up 15 lbs from baseline weight, per patient - Appears volume overloaded on exam - Echo ordered - UDS ordered - Given IV Lasix 60 mg in ED - Total output since admission 1.35 L - Cr 1.24>>1.09 - Restarted patient's home GDMT including carvedilol 6.25 mg BID, Farxiga 10 mg, digoxin 0.0625 mg, and Entresto 97-103 mg - Continue  IV lasix 40 mg q12h - Continue to monitor kidney function, strict I/Os, and daily weights  Essential HTN - BP elevated since admission but trending down with reintroduction of PTA medications  Dyslipidemia - Most recent lipid panel 05/27/22 TC 216, LDL 145, TG 124 - Continue PTA atorvastatin 20 mg  T2DM - Management per IM  Symptomatic anemia - Management per IM  For questions or updates, please contact [facility] Please consult [email or web address] for contact info under    Signed, Orion Crook, PA-C  04/15/2023 8:06 AM

## 2023-04-15 NOTE — Assessment & Plan Note (Signed)
-   We will continue statin therapy. 

## 2023-04-15 NOTE — ED Provider Notes (Signed)
[facility] Provider Note    Event Date/Time   First MD Initiated Contact with Patient 04/15/23 [phone]     (approximate)   History   Shortness of Breath and Leg Swelling   HPI  Molly Howard is a 37 y.o. female  with history of CHF who presents to the emergency department today because of concerns for swelling and shortness of breath.  She states her symptoms have been getting worse over the past week.  She also has not been able to take her torsemide over the past week because she got kicked out of her house and she did not have it with her.  She states that her shortness of breath is accompanied by a cough.  She has noticed the swelling in her legs and stomach.  She is scheduled to have a pacemaker placed later this month.     Physical Exam   Triage Vital Signs: ED Triage Vitals [04/14/23 2327]  Encounter Vitals Group     BP (!) 153/101     Systolic BP Percentile      Diastolic BP Percentile      Pulse Rate 95     Resp (!) 24     Temp 99.1 F (37.3 C)     Temp Source Oral     SpO2 95 %     Weight 202 lb 13.2 oz (92 kg)     Height 5\' 7"  (1.702 m)     Head Circumference      Peak Flow      Pain Score 0     Pain Loc      Pain Education      Exclude from Growth Chart     Most recent vital signs: Vitals:   04/14/23 2327  BP: (!) 153/101  Pulse: 95  Resp: (!) 24  Temp: 99.1 F (37.3 C)  SpO2: 95%   General: Awake, alert, oriented. CV:  Good peripheral perfusion. Regular rate and rhythm. Resp:  Slightly increased work of breathing. No wheezing/crackles appreciated. Abd:  No distention.  Other:  Trace lower extremity edema.    ED Results / Procedures / Treatments   Labs (all labs ordered are listed, but only abnormal results are displayed) Labs Reviewed  BASIC METABOLIC PANEL - Abnormal; Notable for the following components:      Result Value   Glucose, Bld 210 (*)    BUN 25 (*)    Creatinine, Ser 1.24 (*)    Calcium 7.6 (*)     GFR, Estimated 57 (*)    All other components within normal limits  CBC - Abnormal; Notable for the following components:   RBC 3.13 (*)    Hemoglobin 9.8 (*)    HCT 30.9 (*)    All other components within normal limits  BRAIN NATRIURETIC PEPTIDE - Abnormal; Notable for the following components:   B Natriuretic Peptide 1,519.4 (*)    All other components within normal limits  TROPONIN I (HIGH SENSITIVITY) - Abnormal; Notable for the following components:   Troponin I (High Sensitivity) 20 (*)    All other components within normal limits  POC URINE PREG, ED     EKG  I, Phineas Semen, attending physician, personally viewed and interpreted this EKG  EKG Time: 2331 Rate: 96 Rhythm: normal sinus rhythm Axis: right axis deviation Intervals: qtc 469 QRS: LAFB ST changes: no st elevation Impression: abnormal ekg   RADIOLOGY I independently interpreted and visualized the CXR. My interpretation:  Cardiomegaly. Pulmonary edema. Radiology interpretation:  IMPRESSION:  Mild cardiomegaly with vascular congestion and mild interstitial  edema.     PROCEDURES:  Critical Care performed: No   MEDICATIONS ORDERED IN ED: Medications - No data to display   IMPRESSION / MDM / ASSESSMENT AND PLAN / ED COURSE  I reviewed the triage vital signs and the nursing notes.                              Differential diagnosis includes, but is not limited to, CHF, pneumonia  Patient's presentation is most consistent with acute presentation with potential threat to life or bodily function.   Patient presents to the emergency department today because of concerns for swelling and shortness of breath.  Patient with history of CHF.  Workup today is consistent with CHF with elevated BNP and pulmonary edema on chest x-ray.  I do think patient would benefit from IV diuresis.  Will give first dose of Lasix here in the emergency department.  Discussed with Dr. Arville Care with the hospitalist service who  will evaluate for admission.      FINAL CLINICAL IMPRESSION(S) / ED DIAGNOSES   Final diagnoses:  Congestive heart failure, unspecified HF chronicity, unspecified heart failure type [facility])      Note:  This document was prepared using Dragon voice recognition software and may include unintentional dictation errors.    Phineas Semen, MD 04/15/23 [phone]

## 2023-04-15 NOTE — Progress Notes (Signed)
  Brief Progress Note (See full H&P from earlier today)   Subjective: Pt seen and examined in ED Reports feeling SOB but overall better compared to yesterday    Objective: Relevant new results:  none Physical Exam:  BP (!) 124/92 (BP Location: Left Arm)   Pulse 80   Temp 97.8 F (36.6 C) (Oral)   Resp 19   Ht 5\' 7"  (1.702 m)   Wt 92 kg   LMP 04/09/2023 (Exact Date)   SpO2 99%   BMI 31.77 kg/m  Constitutional:  General Appearance: alert, well-developed, well-nourished, NAD Respiratory: Normal respiratory effort Breath sounds slight rales Cardiovascular: S1/S2  trace lower extremity edema Musculoskeletal:  No clubbing/cyanosis of digits Neurological: No cranial nerve deficit on limited exam Psychiatric: Normal judgment/insight Normal mood and affect   Assessment/Plan changes or updates compared to H&P:  Acute on chronic combined systolic and diastolic CHF  HTN HLD Patient was scheduled for implantation of defibrillator on 04/19/2023 with Dr. Lalla Brothers. He was notified of admission and procedure has been cancelled.  Echo pending  Continue diresus Cardiology following   Anemia  Iron deficiency/chronic inflammation Monitor CBC  DM2 Recent A1C 7.7 02/2023 Continue farxiga

## 2023-04-15 NOTE — Progress Notes (Signed)
PHARMACIST - PHYSICIAN COMMUNICATION  CONCERNING:  Enoxaparin (Lovenox) for DVT Prophylaxis    RECOMMENDATION: Patient was prescribed enoxaprin 40mg  q24 hours for VTE prophylaxis.   Filed Weights   04/14/23 2327  Weight: 92 kg (202 lb 13.2 oz)    Body mass index is 31.77 kg/m.  Estimated Creatinine Clearance: 71.7 mL/min (A) (by C-G formula based on SCr of 1.24 mg/dL (H)).   Based on [facility] policy patient is candidate for enoxaparin 0.5mg /kg TBW SQ every 24 hours based on BMI being >30.  DESCRIPTION: Pharmacy has adjusted enoxaparin dose per [facility] policy.  Patient is now receiving enoxaparin 0.5 mg/kg every 24 hours   Otelia Sergeant, PharmD, [facility] 04/15/2023 2:04 AM

## 2023-04-15 NOTE — ED Notes (Signed)
Confirmed admission to [facility] with Dwana Curd NT

## 2023-04-16 DIAGNOSIS — I10 Essential (primary) hypertension: Secondary | ICD-10-CM

## 2023-04-16 DIAGNOSIS — I5021 Acute systolic (congestive) heart failure: Secondary | ICD-10-CM

## 2023-04-16 DIAGNOSIS — I5043 Acute on chronic combined systolic (congestive) and diastolic (congestive) heart failure: Secondary | ICD-10-CM | POA: Diagnosis not present

## 2023-04-16 LAB — BASIC METABOLIC PANEL
Anion gap: 6 (ref 5–15)
BUN: 24 mg/dL — ABNORMAL HIGH (ref 6–20)
CO2: 27 mmol/L (ref 22–32)
Calcium: 7.6 mg/dL — ABNORMAL LOW (ref 8.9–10.3)
Chloride: 105 mmol/L (ref 98–111)
Creatinine, Ser: 1.16 mg/dL — ABNORMAL HIGH (ref 0.44–1.00)
GFR, Estimated: >60 mL/min (ref >60)
Glucose, Bld: 143 mg/dL — ABNORMAL HIGH (ref 70–99)
Potassium: 4.1 mmol/L (ref 3.5–5.1)
Sodium: 138 mmol/L (ref 135–145)

## 2023-04-16 LAB — URINE DRUG SCREEN, QUALITATIVE (ARMC ONLY)
Amphetamines, Ur Screen: NOT DETECTED
Barbiturates, Ur Screen: NOT DETECTED
Benzodiazepine, Ur Scrn: NOT DETECTED
Cannabinoid 50 Ng, Ur ~~LOC~~: NOT DETECTED
Cocaine Metabolite,Ur ~~LOC~~: NOT DETECTED
MDMA (Ecstasy)Ur Screen: NOT DETECTED
Methadone Scn, Ur: NOT DETECTED
Opiate, Ur Screen: NOT DETECTED
Phencyclidine (PCP) Ur S: NOT DETECTED
Tricyclic, Ur Screen: NOT DETECTED

## 2023-04-16 LAB — CBC
HCT: 27.4 % — ABNORMAL LOW (ref 36.0–46.0)
Hemoglobin: 8.7 g/dL — ABNORMAL LOW (ref 12.0–15.0)
MCH: 30.9 pg (ref 26.0–34.0)
MCHC: 31.8 g/dL (ref 30.0–36.0)
MCV: 97.2 fL (ref 80.0–100.0)
Platelets: 327 10*3/uL (ref 150–400)
RBC: 2.82 MIL/uL — ABNORMAL LOW (ref 3.87–5.11)
RDW: 14.2 % (ref 11.5–15.5)
WBC: 5.6 10*3/uL (ref 4.0–10.5)
nRBC: 0 % (ref 0.0–0.2)

## 2023-04-16 LAB — VITAMIN B12: Vitamin B-12: 226 pg/mL (ref 180–914)

## 2023-04-16 MED ORDER — FUROSEMIDE 10 MG/ML IJ SOLN
40.0000 mg | Freq: Three times a day (TID) | INTRAMUSCULAR | Status: DC
Start: 2023-04-16 — End: 2023-04-19
  Administered 2023-04-16 – 2023-04-19 (×8): 40 mg via INTRAVENOUS
  Filled 2023-04-16 (×8): qty 4

## 2023-04-16 MED ORDER — CYCLOBENZAPRINE HCL 10 MG PO TABS
5.0000 mg | ORAL_TABLET | Freq: Three times a day (TID) | ORAL | Status: DC | PRN
  Administered 2023-04-16: 5 mg via ORAL
  Filled 2023-04-16: qty 1

## 2023-04-16 NOTE — Plan of Care (Signed)
  Problem: Clinical Measurements: Goal: Ability to maintain clinical measurements within normal limits will improve Outcome: Progressing Goal: Will remain free from infection Outcome: Progressing Goal: Cardiovascular complication will be avoided Outcome: Progressing   Problem: Activity: Goal: Risk for activity intolerance will decrease Outcome: Progressing   Problem: Nutrition: Goal: Adequate nutrition will be maintained Outcome: Progressing   Problem: Coping: Goal: Level of anxiety will decrease Outcome: Progressing   

## 2023-04-16 NOTE — Progress Notes (Signed)
Cardiology Progress Note   Patient Name: Molly Howard Date of Encounter: 04/16/2023  Primary Cardiologist: Debbe Odea, MD  Subjective   Breathing somewhat better.  Notes good urine output though not saving.  Abdomen remains firm but slightly less so. Objective   Inpatient Medications    Scheduled Meds:  atorvastatin  20 mg Oral Daily   carvedilol  6.25 mg Oral BID WC   dapagliflozin propanediol  10 mg Oral Daily   digoxin  0.0625 mg Oral Daily   enoxaparin (LOVENOX) injection  45 mg Subcutaneous Q24H   furosemide  40 mg Intravenous Q12H   sacubitril-valsartan  1 tablet Oral BID   Continuous Infusions:  PRN Meds: acetaminophen **OR** acetaminophen, guaiFENesin-dextromethorphan, magnesium hydroxide, ondansetron **OR** ondansetron (ZOFRAN) IV, traZODone   Vital Signs    Vitals:   04/16/23 0611 04/16/23 0645 04/16/23 0700 04/16/23 0800  BP: 136/87   (!) 121/104  Pulse: 79 81 76 83  Resp:   17 18  Temp:      TempSrc:      SpO2: 99% 100% 99% 99%  Weight:      Height:       No intake or output data in the 24 hours ending 04/16/23 0838 Filed Weights   04/14/23 2327  Weight: 92 kg    Physical Exam   GEN: Well nourished, well developed, in no acute distress.  HEENT: Grossly normal.  Neck: Supple, JVD to jaw.  No bruits or masses. Cardiac: RRR, no murmurs, rubs, or gallops. No clubbing, cyanosis, edema.  Radials 2+, DP/PT 2+ and equal bilaterally.  Respiratory:  Respirations regular and unlabored, bibasilar crackles. GI: Soft, protuberant, nontender, BS + x 4. MS: no deformity or atrophy. Skin: warm and dry, no rash. Neuro:  Strength and sensation are intact. Psych: AAOx3.  Normal affect.  Labs    Chemistry Recent Labs  Lab 04/14/23 2334 04/15/23 0509 04/16/23 0612  NA 136 133* 138  K 4.2 3.9 4.1  CL 103 102 105  CO2 24 24 27   GLUCOSE 210* 167* 143*  BUN 25* 23* 24*  CREATININE 1.24* 1.09* 1.16*  CALCIUM 7.6* 7.3* 7.6*  GFRNONAA 57* >60 >60   ANIONGAP 9 7 6      Hematology Recent Labs  Lab 04/14/23 2334 04/15/23 0509 04/16/23 0612  WBC 6.9 6.2 5.6  RBC 3.13* 2.81*  2.82* 2.82*  HGB 9.8* 8.7* 8.7*  HCT 30.9* 27.0* 27.4*  MCV 98.7 96.1 97.2  MCH 31.3 31.0 30.9  MCHC 31.7 32.2 31.8  RDW 14.4 14.2 14.2  PLT 341 316 327    Cardiac Enzymes  Recent Labs  Lab 04/14/23 2334 04/15/23 0127  TROPONINIHS 20* 20*      BNP    Component Value Date/Time   BNP 1,519.4 (H) 04/14/2023 2334   Lipids  Lab Results  Component Value Date   CHOL 216 (H) 05/27/2022   HDL 46 05/27/2022   LDLCALC 145 (H) 05/27/2022   TRIG 124 05/27/2022   CHOLHDL 4.7 05/27/2022    HbA1c  Lab Results  Component Value Date   HGBA1C 7.7 (A) 02/17/2023    Radiology    DG Chest 2 View Result Date: 04/14/2023 CLINICAL DATA:  Shortness of breath. EXAM: CHEST - 2 VIEW COMPARISON:  Chest radiograph dated 11/05/2022. FINDINGS: There is mild cardiomegaly with vascular congestion and mild interstitial edema. Pneumonia is not excluded. No consolidative changes. There is no pleural effusion or pneumothorax. No acute osseous pathology. IMPRESSION: Mild cardiomegaly with vascular congestion  and mild interstitial edema. Electronically Signed   By: Elgie Collard M.D.   On: 04/14/2023 23:49     Telemetry    Sinus rhythm- Personally Reviewed  Cardiac Studies   2D Echocardiogram 3.2024  1. Left ventricular ejection fraction, by estimation, is 25 to 30%. Left  ventricular ejection fraction by 2D MOD biplane is 21.6 %. The left  ventricle has severely decreased function. The left ventricle demonstrates  global hypokinesis. Left ventricular  diastolic parameters are indeterminate.   2. Right ventricular systolic function is mildly reduced. The right  ventricular size is normal. There is moderately elevated pulmonary artery  systolic pressure. The estimated right ventricular systolic pressure is  50.7 mmHg.   3. Left atrial size was mildly  dilated.   4. A small pericardial effusion is present. The pericardial effusion is  circumferential. There is no evidence of cardiac tamponade.   5. The aortic valve is tricuspid. Aortic valve regurgitation is not  visualized. No aortic stenosis is present.   6. The mitral valve is normal in structure. Mild mitral valve  regurgitation. No evidence of mitral stenosis.   7. Tricuspid valve regurgitation is moderate to severe.   Patient Profile     37 y.o. female with a hx of combined systolic and diastolic CHF, CKD II, T2DM, hypertension, nonischemic cardiomyopathy, GERD, and polysubstance abuse, admitted 12/13 w/ recurrent CHF in the setting of noncompliance.  Assessment & Plan    1.  Acute on chronic HFrEF/NICM:  EF 25-30% in 07/2022.  No meds after being kicked out of home on 11/27.  Presented 12/12 w/ massive volume overload.  Says she is responding well to IV Lasix though I's and O's are inaccurate. Renal fxn relatively stable (24/1.16).  Bicarb 27.  UDS neg. continue Lasix 40 IV twice daily in addition to beta-blocker, Sherryll Burger, Farxiga, and digoxin.  Consider spironolactone though may not be ideal in the setting of noncompliance with follow-up.  2.  Primary HTN: Relatively stable.  Continue current regimen and follow with diuresis.  3.  HL:  LDL 145 earlier this year.  Cont statin - poor compliance.  4.  DMII: A1c 7.7 in 02/2023.  Per IM.  5.  Normocytic anemia:  stable @ 8.7/27.4.  6.  Substance abuse:  UDS neg.  Signed, Nicolasa Ducking, NP  04/16/2023, 8:38 AM    For questions or updates, please contact   Please consult [email or web address] for contact info under Cardiology/STEMI.

## 2023-04-16 NOTE — ED Notes (Signed)
Pt given breakfast tray

## 2023-04-16 NOTE — Progress Notes (Signed)
PROGRESS NOTE    Molly Howard   QIO:[id number] DOB: [birth year]  DOA: 04/15/2023 Date of Service: 04/16/23 which is hospital day 1  PCP: Margarita Mail, DO    HPI: Molly Howard is a 37 y.o. African-American female with medical history significant for combined systolic and diastolic CHF, stage II chronic kidney disease, type 2 diabetes mellitus, hypertension, and nonischemic cardiomyopathy, GERD as well as polysubstance abuse, who presented to the emergency room with acute onset of worsening dyspnea with associated bilateral lower extremity edema which have been getting worse over the last week.  She admits to orthopnea and paroxysmal nocturnal dyspnea as well as dyspnea on exertion.   Hospital course / significant events:  12/13: admitted to hospitalist service for CHF exacerbation.  12/14: continue diuresis   Consultants:  cardiology  Procedures/Surgeries: none      ASSESSMENT & PLAN:   Acute on chronic combined systolic and diastolic CHF (congestive heart failure)  EF 25-30% in 07/2022  Unable to adhere to medications recently due to kicked out of home Cardiology following Continue diuresis Lasix 40 mg IV bid Continue Coreg, Entresto, Farxiga, digoxin Consider spironolactone per cardiology.   Chronic anemia Monitor CBC   Controlled type 2 diabetes mellitus without complication, without long-term current use of insulin (HCC) continued on Farxiga.   Dyslipidemia statin   Essential hypertension Regimen as above   Class 1 obesity based on BMI: Body mass index is 31.77 kg/m.  Underweight - under 18  overweight - 25 to 29 obese - 30 or more Class 1 obesity: BMI of 30.0 to 34 Class 2 obesity: BMI of 35.0 to 39 Class 3 obesity: BMI of 40.0 to 49 Super Morbid Obesity: BMI 50-59 Super-super Morbid Obesity: BMI 60+ Significantly low or high BMI is associated with higher medical risk.  Weight management advised as adjunct to other disease management and risk  reduction treatments    DVT prophylaxis: lovenox IV fluids: no continuous IV fluids  Nutrition: cardiac diet Central lines / invasive devices: none  Code Status: FULL CODE ACP documentation reviewed:  none on file in [facility]  TOC needs: none at this time Barriers to dispo / significant pending items: diuresing, cardiology following              Subjective / Brief ROS:  Patient reports breathing is a bit better today, still significant UOP but seems to be slowing down some  Denies CP/SOB.  Pain controlled.  Denies new weakness.  Tolerating diet.  Reports no concerns w/ urination/defecation.   Family Communication:  none    Objective Findings:  Vitals:   04/16/23 0800 04/16/23 0959 04/16/23 1313 04/16/23 1459  BP: (!) 121/104  (!) 142/99 (!) 121/97  Pulse: 83  83 83  Resp: 18  17 18   Temp:  98.2 F (36.8 C)  97.8 F (36.6 C)  TempSrc:  Oral  Oral  SpO2: 99%  100% 92%  Weight:      Height:       No intake or output data in the 24 hours ending 04/16/23 1634 Filed Weights   04/14/23 2327  Weight: 92 kg    Examination:  Physical Exam Constitutional:      General: She is not in acute distress. Pulmonary:     Effort: Pulmonary effort is normal.     Breath sounds: Examination of the right-lower field reveals rales. Examination of the left-lower field reveals rales. Rales present.  Musculoskeletal:     Right lower leg: No edema.  Left lower leg: No edema.  Neurological:     General: No focal deficit present.     Mental Status: She is alert and oriented to person, place, and time.  Psychiatric:        Mood and Affect: Mood normal.        Behavior: Behavior normal.          Scheduled Medications:   atorvastatin  20 mg Oral Daily   carvedilol  6.25 mg Oral BID WC   dapagliflozin propanediol  10 mg Oral Daily   digoxin  0.0625 mg Oral Daily   enoxaparin (LOVENOX) injection  45 mg Subcutaneous Q24H   furosemide  40 mg Intravenous Q12H    sacubitril-valsartan  1 tablet Oral BID    Continuous Infusions:   PRN Medications:  acetaminophen **OR** acetaminophen, cyclobenzaprine, guaiFENesin-dextromethorphan, magnesium hydroxide, ondansetron **OR** ondansetron (ZOFRAN) IV, traZODone  Antimicrobials from admission:  Anti-infectives (From admission, onward)    None           Data Reviewed:  I have personally reviewed the following...  CBC: Recent Labs  Lab 04/14/23 2334 04/15/23 0509 04/16/23 0612  WBC 6.9 6.2 5.6  HGB 9.8* 8.7* 8.7*  HCT 30.9* 27.0* 27.4*  MCV 98.7 96.1 97.2  PLT 341 316 327   Basic Metabolic Panel: Recent Labs  Lab 04/14/23 2334 04/15/23 0509 04/16/23 0612  NA 136 133* 138  K 4.2 3.9 4.1  CL 103 102 105  CO2 24 24 27   GLUCOSE 210* 167* 143*  BUN 25* 23* 24*  CREATININE 1.24* 1.09* 1.16*  CALCIUM 7.6* 7.3* 7.6*   GFR: Estimated Creatinine Clearance: 76.6 mL/min (A) (by C-G formula based on SCr of 1.16 mg/dL (H)). Liver Function Tests: No results for input(s): "AST", "ALT", "ALKPHOS", "BILITOT", "PROT", "ALBUMIN" in the last 168 hours. No results for input(s): "LIPASE", "AMYLASE" in the last 168 hours. No results for input(s): "AMMONIA" in the last 168 hours. Coagulation Profile: No results for input(s): "INR", "PROTIME" in the last 168 hours. Cardiac Enzymes: No results for input(s): "CKTOTAL", "CKMB", "CKMBINDEX", "TROPONINI" in the last 168 hours. BNP (last 3 results) No results for input(s): "PROBNP" in the last 8760 hours. HbA1C: No results for input(s): "HGBA1C" in the last 72 hours. CBG: No results for input(s): "GLUCAP" in the last 168 hours. Lipid Profile: No results for input(s): "CHOL", "HDL", "LDLCALC", "TRIG", "CHOLHDL", "LDLDIRECT" in the last 72 hours. Thyroid Function Tests: No results for input(s): "TSH", "T4TOTAL", "FREET4", "T3FREE", "THYROIDAB" in the last 72 hours. Anemia Panel: Recent Labs    04/15/23 0127 04/15/23 0509 04/16/23 0612   VITAMINB12  --   --  226  FOLATE 9.4  --   --   FERRITIN 44  --   --   TIBC 242*  --   --   IRON 22*  --   --   RETICCTPCT  --  3.3*  --    Most Recent Urinalysis On File:  No results found for: "COLORURINE", "APPEARANCEUR", "LABSPEC", "PHURINE", "GLUCOSEU", "HGBUR", "BILIRUBINUR", "KETONESUR", "PROTEINUR", "UROBILINOGEN", "NITRITE", "LEUKOCYTESUR" Sepsis Labs: @LABRCNTIP (procalcitonin:4,lacticidven:4) Microbiology: No results found for this or any previous visit (from the past 240 hours).    Radiology Studies last 3 days: DG Chest 2 View Result Date: 04/14/2023 CLINICAL DATA:  Shortness of breath. EXAM: CHEST - 2 VIEW COMPARISON:  Chest radiograph dated 11/05/2022. FINDINGS: There is mild cardiomegaly with vascular congestion and mild interstitial edema. Pneumonia is not excluded. No consolidative changes. There is no pleural effusion or pneumothorax. No  acute osseous pathology. IMPRESSION: Mild cardiomegaly with vascular congestion and mild interstitial edema. Electronically Signed   By: Elgie Collard M.D.   On: 04/14/2023 23:49     Sunnie Nielsen, DO [facility] 04/16/2023, 4:34 PM    Dictation software may have been used to generate the above note. Typos may occur and escape review in typed/dictated notes. Please contact Dr Lyn Hollingshead directly for clarity if needed.  Staff may message me via secure chat in Epic  but this may not receive an immediate response,  please page me for urgent matters!  If 7PM-7AM, please contact night coverage [email or web address]

## 2023-04-17 DIAGNOSIS — I10 Essential (primary) hypertension: Secondary | ICD-10-CM | POA: Diagnosis not present

## 2023-04-17 DIAGNOSIS — I5043 Acute on chronic combined systolic (congestive) and diastolic (congestive) heart failure: Secondary | ICD-10-CM | POA: Diagnosis not present

## 2023-04-17 LAB — BASIC METABOLIC PANEL
Anion gap: 5 (ref 5–15)
BUN: 34 mg/dL — ABNORMAL HIGH (ref 6–20)
CO2: 31 mmol/L (ref 22–32)
Calcium: 7.7 mg/dL — ABNORMAL LOW (ref 8.9–10.3)
Chloride: 101 mmol/L (ref 98–111)
Creatinine, Ser: 1.15 mg/dL — ABNORMAL HIGH (ref 0.44–1.00)
GFR, Estimated: >60 mL/min (ref >60)
Glucose, Bld: 168 mg/dL — ABNORMAL HIGH (ref 70–99)
Potassium: 4.7 mmol/L (ref 3.5–5.1)
Sodium: 137 mmol/L (ref 135–145)

## 2023-04-17 LAB — MAGNESIUM: Magnesium: 2 mg/dL (ref 1.7–2.4)

## 2023-04-17 LAB — CBC
HCT: 27.6 % — ABNORMAL LOW (ref 36.0–46.0)
Hemoglobin: 8.6 g/dL — ABNORMAL LOW (ref 12.0–15.0)
MCH: 29.9 pg (ref 26.0–34.0)
MCHC: 31.2 g/dL (ref 30.0–36.0)
MCV: 95.8 fL (ref 80.0–100.0)
Platelets: 370 10*3/uL (ref 150–400)
RBC: 2.88 MIL/uL — ABNORMAL LOW (ref 3.87–5.11)
RDW: 14 % (ref 11.5–15.5)
WBC: 5.8 10*3/uL (ref 4.0–10.5)
nRBC: 0 % (ref 0.0–0.2)

## 2023-04-17 MED ORDER — BENZONATATE 100 MG PO CAPS
100.0000 mg | ORAL_CAPSULE | Freq: Once | ORAL | Status: AC
  Administered 2023-04-17: 100 mg via ORAL
  Filled 2023-04-17: qty 1

## 2023-04-17 NOTE — Progress Notes (Signed)
PROGRESS NOTE    Molly Howard   IHK:[id number] DOB: December 21, 1985  DOA: 04/15/2023 Date of Service: 04/17/23 which is hospital day 2  PCP: Margarita Mail, DO    HPI: Molly Howard is a 37 y.o. African-American female with medical history significant for combined systolic and diastolic CHF, stage II chronic kidney disease, type 2 diabetes mellitus, hypertension, and nonischemic cardiomyopathy, GERD as well as polysubstance abuse, who presented to the emergency room with acute onset of worsening dyspnea with associated bilateral lower extremity edema which have been getting worse over the last week.  She admits to orthopnea and paroxysmal nocturnal dyspnea as well as dyspnea on exertion.   Hospital course / significant events:  12/13: admitted to hospitalist service for CHF exacerbation.  12/14-12/15: continue diuresis per cardiology   Consultants:  cardiology  Procedures/Surgeries: none      ASSESSMENT & PLAN:   Acute on chronic combined systolic and diastolic CHF (congestive heart failure)  EF 25-30% in 07/2022  Unable to adhere to medications recently due to kicked out of home Cardiology following Continue diuresis Lasix 40 mg IV tid  Continue Coreg, Entresto, Farxiga, digoxin Consider spironolactone per cardiology. Will need to confirm outpatient f/u plan for ICD which was initially scheduled for this week  Monitor BMP on diuresis    Chronic anemia Monitor CBC   Controlled type 2 diabetes mellitus without complication, without long-term current use of insulin (HCC) continued on Farxiga.   Dyslipidemia statin   Essential hypertension Regimen as above   Class 1 obesity based on BMI: Body mass index is 31.77 kg/m.  Underweight - under 18  overweight - 25 to 29 obese - 30 or more Class 1 obesity: BMI of 30.0 to 34 Class 2 obesity: BMI of 35.0 to 39 Class 3 obesity: BMI of 40.0 to 49 Super Morbid Obesity: BMI 50-59 Super-super Morbid Obesity: BMI  60+ Significantly low or high BMI is associated with higher medical risk.  Weight management advised as adjunct to other disease management and risk reduction treatments    DVT prophylaxis: lovenox IV fluids: no continuous IV fluids  Nutrition: cardiac diet Central lines / invasive devices: none  Code Status: FULL CODE ACP documentation reviewed:  none on file in [facility]  TOC needs: none at this time Barriers to dispo / significant pending items: diuresing, cardiology following              Subjective / Brief ROS:  Patient reports breathing is a bit better today Denies CP/SOB.  Pain controlled.  Denies new weakness.  Tolerating diet.  Reports no concerns w/ urination/defecation.   Family Communication:  none    Objective Findings:  Vitals:   04/16/23 2324 04/17/23 0350 04/17/23 0941 04/17/23 1203  BP: (!) 140/83 128/80 (!) 142/91 119/82  Pulse: 83 72 82 76  Resp: 18 16 17    Temp: 98.2 F (36.8 C) 98 F (36.7 C) 97.6 F (36.4 C) 98.1 F (36.7 C)  TempSrc:  Oral  Oral  SpO2: 100% 100% 100% 98%  Weight:      Height:        Intake/Output Summary (Last 24 hours) at 04/17/2023 1340 Last data filed at 04/17/2023 0350 Gross per 24 hour  Intake 360 ml  Output 650 ml  Net -290 ml   Filed Weights   04/14/23 2327  Weight: 92 kg    Examination:  Physical Exam Constitutional:      General: She is not in acute distress. Pulmonary:  Effort: Pulmonary effort is normal.     Breath sounds: Examination of the right-lower field reveals rales. Examination of the left-lower field reveals rales. Rales present.  Musculoskeletal:     Right lower leg: No edema.     Left lower leg: No edema.  Neurological:     General: No focal deficit present.     Mental Status: She is alert and oriented to person, place, and time.  Psychiatric:        Mood and Affect: Mood normal.        Behavior: Behavior normal.          Scheduled Medications:   atorvastatin  20  mg Oral Daily   carvedilol  6.25 mg Oral BID WC   dapagliflozin propanediol  10 mg Oral Daily   digoxin  0.0625 mg Oral Daily   enoxaparin (LOVENOX) injection  45 mg Subcutaneous Q24H   furosemide  40 mg Intravenous TID   sacubitril-valsartan  1 tablet Oral BID    Continuous Infusions:   PRN Medications:  acetaminophen **OR** acetaminophen, cyclobenzaprine, guaiFENesin-dextromethorphan, magnesium hydroxide, ondansetron **OR** ondansetron (ZOFRAN) IV, traZODone  Antimicrobials from admission:  Anti-infectives (From admission, onward)    None           Data Reviewed:  I have personally reviewed the following...  CBC: Recent Labs  Lab 04/14/23 2334 04/15/23 0509 04/16/23 0612 04/17/23 0502  WBC 6.9 6.2 5.6 5.8  HGB 9.8* 8.7* 8.7* 8.6*  HCT 30.9* 27.0* 27.4* 27.6*  MCV 98.7 96.1 97.2 95.8  PLT 341 316 327 370   Basic Metabolic Panel: Recent Labs  Lab 04/14/23 2334 04/15/23 0509 04/16/23 0612 04/17/23 0502  NA 136 133* 138 137  K 4.2 3.9 4.1 4.7  CL 103 102 105 101  CO2 24 24 27 31   GLUCOSE 210* 167* 143* 168*  BUN 25* 23* 24* 34*  CREATININE 1.24* 1.09* 1.16* 1.15*  CALCIUM 7.6* 7.3* 7.6* 7.7*  MG  --   --   --  2.0   GFR: Estimated Creatinine Clearance: 77.3 mL/min (A) (by C-G formula based on SCr of 1.15 mg/dL (H)). Liver Function Tests: No results for input(s): "AST", "ALT", "ALKPHOS", "BILITOT", "PROT", "ALBUMIN" in the last 168 hours. No results for input(s): "LIPASE", "AMYLASE" in the last 168 hours. No results for input(s): "AMMONIA" in the last 168 hours. Coagulation Profile: No results for input(s): "INR", "PROTIME" in the last 168 hours. Cardiac Enzymes: No results for input(s): "CKTOTAL", "CKMB", "CKMBINDEX", "TROPONINI" in the last 168 hours. BNP (last 3 results) No results for input(s): "PROBNP" in the last 8760 hours. HbA1C: No results for input(s): "HGBA1C" in the last 72 hours. CBG: No results for input(s): "GLUCAP" in the last  168 hours. Lipid Profile: No results for input(s): "CHOL", "HDL", "LDLCALC", "TRIG", "CHOLHDL", "LDLDIRECT" in the last 72 hours. Thyroid Function Tests: No results for input(s): "TSH", "T4TOTAL", "FREET4", "T3FREE", "THYROIDAB" in the last 72 hours. Anemia Panel: Recent Labs    04/15/23 0127 04/15/23 0509 04/16/23 0612  VITAMINB12  --   --  226  FOLATE 9.4  --   --   FERRITIN 44  --   --   TIBC 242*  --   --   IRON 22*  --   --   RETICCTPCT  --  3.3*  --    Most Recent Urinalysis On File:  No results found for: "COLORURINE", "APPEARANCEUR", "LABSPEC", "PHURINE", "GLUCOSEU", "HGBUR", "BILIRUBINUR", "KETONESUR", "PROTEINUR", "UROBILINOGEN", "NITRITE", "LEUKOCYTESUR" Sepsis Labs: @LABRCNTIP (procalcitonin:4,lacticidven:4) Microbiology: No results  found for this or any previous visit (from the past 240 hours).    Radiology Studies last 3 days: DG Chest 2 View Result Date: 04/14/2023 CLINICAL DATA:  Shortness of breath. EXAM: CHEST - 2 VIEW COMPARISON:  Chest radiograph dated 11/05/2022. FINDINGS: There is mild cardiomegaly with vascular congestion and mild interstitial edema. Pneumonia is not excluded. No consolidative changes. There is no pleural effusion or pneumothorax. No acute osseous pathology. IMPRESSION: Mild cardiomegaly with vascular congestion and mild interstitial edema. Electronically Signed   By: Elgie Collard M.D.   On: 04/14/2023 23:49     Sunnie Nielsen, DO [facility] 04/17/2023, 1:40 PM    Dictation software may have been used to generate the above note. Typos may occur and escape review in typed/dictated notes. Please contact Dr Lyn Hollingshead directly for clarity if needed.  Staff may message me via secure chat in Epic  but this may not receive an immediate response,  please page me for urgent matters!  If 7PM-7AM, please contact night coverage [email or web address]

## 2023-04-17 NOTE — Progress Notes (Signed)
Rounding Note    Patient Name: Molly Howard Date of Encounter: 04/17/2023   [facility] Cardiologist: Debbe Odea, MD   Subjective   Shortness of breath improving, abdominal swelling and edema is improving.  Inpatient Medications    Scheduled Meds:  atorvastatin  20 mg Oral Daily   carvedilol  6.25 mg Oral BID WC   dapagliflozin propanediol  10 mg Oral Daily   digoxin  0.0625 mg Oral Daily   enoxaparin (LOVENOX) injection  45 mg Subcutaneous Q24H   furosemide  40 mg Intravenous TID   sacubitril-valsartan  1 tablet Oral BID   Continuous Infusions:  PRN Meds: acetaminophen **OR** acetaminophen, cyclobenzaprine, guaiFENesin-dextromethorphan, magnesium hydroxide, ondansetron **OR** ondansetron (ZOFRAN) IV, traZODone   Vital Signs    Vitals:   04/16/23 2324 04/17/23 0350 04/17/23 0941 04/17/23 1203  BP: (!) 140/83 128/80 (!) 142/91 119/82  Pulse: 83 72 82 76  Resp: 18 16 17    Temp: 98.2 F (36.8 C) 98 F (36.7 C) 97.6 F (36.4 C) 98.1 F (36.7 C)  TempSrc:  Oral  Oral  SpO2: 100% 100% 100% 98%  Weight:      Height:        Intake/Output Summary (Last 24 hours) at 04/17/2023 1217 Last data filed at 04/17/2023 0350 Gross per 24 hour  Intake 360 ml  Output 650 ml  Net -290 ml      04/14/2023   11:27 PM 03/24/2023    3:09 PM 03/16/2023    3:52 PM  Last 3 Weights  Weight (lbs) 202 lb 13.2 oz 202 lb 4 oz 205 lb 3.2 oz  Weight (kg) 92 kg 91.74 kg 93.078 kg      Telemetry    Sinus rhythm- Personally Reviewed  ECG     - Personally Reviewed  Physical Exam   GEN: No acute distress.   Neck: No JVD Cardiac: RRR, no murmurs, rubs, or gallops.  Respiratory: Clear to auscultation bilaterally. GI: Soft, nontender, abdominal distention MS: No edema; No deformity. Neuro:  Nonfocal  Psych: Normal affect   Labs    High Sensitivity Troponin:   Recent Labs  Lab 04/14/23 2334 04/15/23 0127  TROPONINIHS 20* 20*     Chemistry Recent  Labs  Lab 04/15/23 0509 04/16/23 0612 04/17/23 0502  NA 133* 138 137  K 3.9 4.1 4.7  CL 102 105 101  CO2 24 27 31   GLUCOSE 167* 143* 168*  BUN 23* 24* 34*  CREATININE 1.09* 1.16* 1.15*  CALCIUM 7.3* 7.6* 7.7*  MG  --   --  2.0  GFRNONAA >60 >60 >60  ANIONGAP 7 6 5     Lipids No results for input(s): "CHOL", "TRIG", "HDL", "LABVLDL", "LDLCALC", "CHOLHDL" in the last 168 hours.  Hematology Recent Labs  Lab 04/15/23 0509 04/16/23 0612 04/17/23 0502  WBC 6.2 5.6 5.8  RBC 2.81*  2.82* 2.82* 2.88*  HGB 8.7* 8.7* 8.6*  HCT 27.0* 27.4* 27.6*  MCV 96.1 97.2 95.8  MCH 31.0 30.9 29.9  MCHC 32.2 31.8 31.2  RDW 14.2 14.2 14.0  PLT 316 327 370   Thyroid No results for input(s): "TSH", "FREET4" in the last 168 hours.  BNP Recent Labs  Lab 04/14/23 2334  BNP 1,519.4*    DDimer No results for input(s): "DDIMER" in the last 168 hours.   Radiology    No results found.  Cardiac Studies   Echo with EF 25-30%  Patient Profile     37 y.o. female with history of  nonischemic cardiomyopathy, hypertension, diabetes, CKD presenting with worsening shortness of breat   Assessment & Plan    1.  Nonischemic cardiomyopathy EF 30% -Edema improving.  Abdomen still distended -Continue IV Lasix to 40 mg 3 times daily. -Monitor ins and out, creatinine. -Continue Coreg 6.25 mg twice daily, Entresto 97-103 mg twice daily.   2.  Hypertension -BP controlled -Coreg, Entresto as above.       Signed, Debbe Odea, MD  04/17/2023, 12:17 PM

## 2023-04-18 ENCOUNTER — Telehealth (HOSPITAL_COMMUNITY): Payer: Self-pay

## 2023-04-18 ENCOUNTER — Other Ambulatory Visit (HOSPITAL_COMMUNITY): Payer: Self-pay

## 2023-04-18 ENCOUNTER — Other Ambulatory Visit: Payer: Self-pay

## 2023-04-18 DIAGNOSIS — I5043 Acute on chronic combined systolic (congestive) and diastolic (congestive) heart failure: Secondary | ICD-10-CM | POA: Diagnosis not present

## 2023-04-18 LAB — BASIC METABOLIC PANEL
Anion gap: 7 (ref 5–15)
BUN: 34 mg/dL — ABNORMAL HIGH (ref 6–20)
CO2: 29 mmol/L (ref 22–32)
Calcium: 8.1 mg/dL — ABNORMAL LOW (ref 8.9–10.3)
Chloride: 101 mmol/L (ref 98–111)
Creatinine, Ser: 1.14 mg/dL — ABNORMAL HIGH (ref 0.44–1.00)
GFR, Estimated: >60 mL/min (ref >60)
Glucose, Bld: 166 mg/dL — ABNORMAL HIGH (ref 70–99)
Potassium: 4.4 mmol/L (ref 3.5–5.1)
Sodium: 137 mmol/L (ref 135–145)

## 2023-04-18 LAB — CBC
HCT: 27 % — ABNORMAL LOW (ref 36.0–46.0)
Hemoglobin: 8.6 g/dL — ABNORMAL LOW (ref 12.0–15.0)
MCH: 30.7 pg (ref 26.0–34.0)
MCHC: 31.9 g/dL (ref 30.0–36.0)
MCV: 96.4 fL (ref 80.0–100.0)
Platelets: 394 10*3/uL (ref 150–400)
RBC: 2.8 MIL/uL — ABNORMAL LOW (ref 3.87–5.11)
RDW: 14.1 % (ref 11.5–15.5)
WBC: 6.5 10*3/uL (ref 4.0–10.5)
nRBC: 0 % (ref 0.0–0.2)

## 2023-04-18 MED ORDER — BENZONATATE 100 MG PO CAPS
200.0000 mg | ORAL_CAPSULE | Freq: Two times a day (BID) | ORAL | Status: DC | PRN
  Administered 2023-04-18: 200 mg via ORAL
  Filled 2023-04-18: qty 2

## 2023-04-18 NOTE — Plan of Care (Signed)
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
## 2023-04-18 NOTE — Telephone Encounter (Signed)
Pharmacy Patient Advocate Encounter  Insurance verification completed.    The patient is insured through [facility] MEDICAID.     Ran test claim for Jardiance and the current 30 day co-pay is PA required.  Ran test claim for Marcelline Deist and the current 30 day co-pay is PA required.  Ran test claim for Entresto and the current 30 day co-pay is $4.00.   This test claim was processed through [facility]- copay amounts may vary at other pharmacies due to pharmacy/plan contracts, or as the patient moves through the different stages of their insurance plan.

## 2023-04-18 NOTE — Progress Notes (Addendum)
Heart Failure Navigator Progress Note  Patient follow-up with Molly Howard in the Heart Failure Clinic was rescheduled since she was still inpatient in the hospital. She is unsure if she will still have surgery tomorrow for a pacemaker at [facility]. New appointment 04/25/23 @ 3:00 PM.  Patient aware of new appointment and said she will set up her own transportation.Contact information updated in EPIC for appointment reminder call. Pt ws able to obtain her scale and medications form her trailer.  Navigator will sign off at this time.  Molly Horseman, RN, BSN [facility] Heart Failure Navigator Secure Chat Only

## 2023-04-18 NOTE — Progress Notes (Signed)
PROGRESS NOTE    Molly Howard   WUJ:[id number] DOB: 1986-01-04  DOA: 04/15/2023 Date of Service: 04/18/23 which is hospital day 3  PCP: Margarita Mail, DO    HPI: Molly Howard is a 37 y.o. African-American female with medical history significant for combined systolic and diastolic CHF, stage II chronic kidney disease, type 2 diabetes mellitus, hypertension, and nonischemic cardiomyopathy, GERD as well as polysubstance abuse, who presented to the emergency room with acute onset of worsening dyspnea with associated bilateral lower extremity edema which have been getting worse over the last week.  She admits to orthopnea and paroxysmal nocturnal dyspnea as well as dyspnea on exertion.   Hospital course / significant events:  12/13: admitted to hospitalist service for CHF exacerbation.  12/14-12/16: continue diuresis per cardiology   Consultants:  cardiology  Procedures/Surgeries: none      ASSESSMENT & PLAN:   Acute on chronic combined systolic and diastolic CHF (congestive heart failure)  EF 25-30% in 07/2022  Unable to adhere to medications recently due to kicked out of home Out 1.75 liters last 24 Cardiology following Continue diuresis Lasix 40 mg IV tid  Continue Coreg, Entresto, Farxiga, digoxin ICD placement will need to be re-scheduled (was scheduled for tomorrow)   Chronic anemia Hgb stable Monitor CBC   Controlled type 2 diabetes mellitus without complication, without long-term current use of insulin (HCC) controlled continued on Farxiga.   Dyslipidemia statin   Essential hypertension controlled Regimen as above   Class 1 obesity based on BMI: Body mass index is 31.77 kg/m.     DVT prophylaxis: lovenox IV fluids: no continuous IV fluids  Nutrition: cardiac diet Central lines / invasive devices: none  Code Status: FULL CODE ACP documentation reviewed:  none on file in [facility]  TOC needs: none at this time Barriers to dispo / significant  pending items: diuresing, cardiology following              Subjective / Brief ROS:  Reports legs remain swollen but improving, no dyspnea or chest pain, no cough or fever, tolerating diet  Family Communication:  none    Objective Findings:  Vitals:   04/17/23 1934 04/18/23 0018 04/18/23 0523 04/18/23 1120  BP: (!) 141/84 (!) 147/91 131/79 131/82  Pulse: 82 75 72 73  Resp: 16 16 14 20   Temp: 98.2 F (36.8 C) 98.1 F (36.7 C) 98.3 F (36.8 C) 98.5 F (36.9 C)  TempSrc: Oral Oral Oral Oral  SpO2: 99% 99% 94% 99%  Weight:   98.6 kg   Height:        Intake/Output Summary (Last 24 hours) at 04/18/2023 1521 Last data filed at 04/18/2023 0351 Gross per 24 hour  Intake 600 ml  Output 1425 ml  Net -825 ml   Filed Weights   04/14/23 2327 04/18/23 0523  Weight: 92 kg 98.6 kg    Examination:  NAD Rales at bases otherwise clear Distant heart sounds, s1/s2, systolic murmur Abdomen obese, non-distended and not tender Pitting edema to thighs Ext warm       Scheduled Medications:   atorvastatin  20 mg Oral Daily   carvedilol  6.25 mg Oral BID WC   dapagliflozin propanediol  10 mg Oral Daily   digoxin  0.0625 mg Oral Daily   enoxaparin (LOVENOX) injection  45 mg Subcutaneous Q24H   furosemide  40 mg Intravenous TID   sacubitril-valsartan  1 tablet Oral BID    Continuous Infusions:   PRN Medications:  acetaminophen **  OR** acetaminophen, cyclobenzaprine, guaiFENesin-dextromethorphan, magnesium hydroxide, ondansetron **OR** ondansetron (ZOFRAN) IV, traZODone  Antimicrobials from admission:  Anti-infectives (From admission, onward)    None           Data Reviewed:  I have personally reviewed the following...  CBC: Recent Labs  Lab 04/14/23 2334 04/15/23 0509 04/16/23 0612 04/17/23 0502 04/18/23 0621  WBC 6.9 6.2 5.6 5.8 6.5  HGB 9.8* 8.7* 8.7* 8.6* 8.6*  HCT 30.9* 27.0* 27.4* 27.6* 27.0*  MCV 98.7 96.1 97.2 95.8 96.4  PLT 341 316 327  370 394   Basic Metabolic Panel: Recent Labs  Lab 04/14/23 2334 04/15/23 0509 04/16/23 0612 04/17/23 0502 04/18/23 0621  NA 136 133* 138 137 137  K 4.2 3.9 4.1 4.7 4.4  CL 103 102 105 101 101  CO2 24 24 27 31 29   GLUCOSE 210* 167* 143* 168* 166*  BUN 25* 23* 24* 34* 34*  CREATININE 1.24* 1.09* 1.16* 1.15* 1.14*  CALCIUM 7.6* 7.3* 7.6* 7.7* 8.1*  MG  --   --   --  2.0  --    GFR: Estimated Creatinine Clearance: 80.7 mL/min (A) (by C-G formula based on SCr of 1.14 mg/dL (H)). Liver Function Tests: No results for input(s): "AST", "ALT", "ALKPHOS", "BILITOT", "PROT", "ALBUMIN" in the last 168 hours. No results for input(s): "LIPASE", "AMYLASE" in the last 168 hours. No results for input(s): "AMMONIA" in the last 168 hours. Coagulation Profile: No results for input(s): "INR", "PROTIME" in the last 168 hours. Cardiac Enzymes: No results for input(s): "CKTOTAL", "CKMB", "CKMBINDEX", "TROPONINI" in the last 168 hours. BNP (last 3 results) No results for input(s): "PROBNP" in the last 8760 hours. HbA1C: No results for input(s): "HGBA1C" in the last 72 hours. CBG: No results for input(s): "GLUCAP" in the last 168 hours. Lipid Profile: No results for input(s): "CHOL", "HDL", "LDLCALC", "TRIG", "CHOLHDL", "LDLDIRECT" in the last 72 hours. Thyroid Function Tests: No results for input(s): "TSH", "T4TOTAL", "FREET4", "T3FREE", "THYROIDAB" in the last 72 hours. Anemia Panel: Recent Labs    04/16/23 0612  VITAMINB12 226   Most Recent Urinalysis On File:  No results found for: "COLORURINE", "APPEARANCEUR", "LABSPEC", "PHURINE", "GLUCOSEU", "HGBUR", "BILIRUBINUR", "KETONESUR", "PROTEINUR", "UROBILINOGEN", "NITRITE", "LEUKOCYTESUR" Sepsis Labs: @LABRCNTIP (procalcitonin:4,lacticidven:4) Microbiology: No results found for this or any previous visit (from the past 240 hours).    Radiology Studies last 3 days: DG Chest 2 View Result Date: 04/14/2023 CLINICAL DATA:  Shortness of  breath. EXAM: CHEST - 2 VIEW COMPARISON:  Chest radiograph dated 11/05/2022. FINDINGS: There is mild cardiomegaly with vascular congestion and mild interstitial edema. Pneumonia is not excluded. No consolidative changes. There is no pleural effusion or pneumothorax. No acute osseous pathology. IMPRESSION: Mild cardiomegaly with vascular congestion and mild interstitial edema. Electronically Signed   By: Elgie Collard M.D.   On: 04/14/2023 23:49     Silvano Bilis, MD [facility] 04/18/2023, 3:21 PM     If 7PM-7AM, please contact night coverage [email or web address]

## 2023-04-19 ENCOUNTER — Encounter (HOSPITAL_COMMUNITY): Payer: Self-pay

## 2023-04-19 ENCOUNTER — Telehealth (HOSPITAL_COMMUNITY): Payer: Self-pay | Admitting: Pharmacy Technician

## 2023-04-19 ENCOUNTER — Ambulatory Visit (HOSPITAL_COMMUNITY): Admit: 2023-04-19 | Payer: Medicaid Other | Admitting: Cardiology

## 2023-04-19 ENCOUNTER — Other Ambulatory Visit (HOSPITAL_COMMUNITY): Payer: Self-pay

## 2023-04-19 DIAGNOSIS — I5043 Acute on chronic combined systolic (congestive) and diastolic (congestive) heart failure: Secondary | ICD-10-CM | POA: Diagnosis not present

## 2023-04-19 LAB — BASIC METABOLIC PANEL
Anion gap: 10 (ref 5–15)
BUN: 34 mg/dL — ABNORMAL HIGH (ref 6–20)
CO2: 29 mmol/L (ref 22–32)
Calcium: 8.3 mg/dL — ABNORMAL LOW (ref 8.9–10.3)
Chloride: 98 mmol/L (ref 98–111)
Creatinine, Ser: 1.14 mg/dL — ABNORMAL HIGH (ref 0.44–1.00)
GFR, Estimated: >60 mL/min (ref >60)
Glucose, Bld: 193 mg/dL — ABNORMAL HIGH (ref 70–99)
Potassium: 3.9 mmol/L (ref 3.5–5.1)
Sodium: 137 mmol/L (ref 135–145)

## 2023-04-19 LAB — MAGNESIUM: Magnesium: 2.1 mg/dL (ref 1.7–2.4)

## 2023-04-19 SURGERY — SUBQ ICD IMPLANT
Anesthesia: General

## 2023-04-19 MED ORDER — TORSEMIDE 20 MG PO TABS
40.0000 mg | ORAL_TABLET | Freq: Every day | ORAL | Status: DC
  Administered 2023-04-19 – 2023-04-20 (×2): 40 mg via ORAL
  Filled 2023-04-19 (×2): qty 2

## 2023-04-19 MED ORDER — SPIRONOLACTONE 12.5 MG HALF TABLET
12.5000 mg | ORAL_TABLET | Freq: Every day | ORAL | Status: DC
  Administered 2023-04-19 – 2023-04-20 (×2): 12.5 mg via ORAL
  Filled 2023-04-19 (×2): qty 1

## 2023-04-19 NOTE — Consult Note (Signed)
Advanced Heart Failure Team Consult Note   Primary Physician: Margarita Mail, DO Cardiologist:  Debbe Odea, MD  Reason for Consultation: Heart Failure   HPI:    Molly Howard is seen today for evaluation of heart failure at the request of Dr Ashok Pall.   Ms Thorpe is a 37 year old with a history of  biventricular HFrEF, DMII, NSVT, PSVT, mod/sever TR, cocaine abuse, and anemia.   Most recent ECHO 07/2022 EF 25-30% , RV mildly reduced.   Recently lost housing and had to move to [facility]. She was not able to take some of her medications including torsemide for at least 1 week. Developed orthopnea, dyspnea with with exertion, PND, and cough. Symptoms worsened. Previously set up for ICD 04/19/23  Presented to [facility] ED with shortness of breath and leg edema. Temp 99.1, hypertensive.  CXR with pulmonary edema. In the ED she was given IV lasix. Admitted with A/C HFrEF. Started on scheduled IV lasix. HF meds restarted.    Home Medications Prior to Admission medications   Medication Sig Start Date End Date Taking? Authorizing Provider  Accu-Chek Softclix Lancets lancets Use as instructed Patient not taking: Reported on 04/04/2023 11/18/22   Margarita Mail, DO  atorvastatin (LIPITOR) 20 MG tablet Take 1 tablet (20 mg total) by mouth daily. Patient not taking: Reported on 04/04/2023 02/04/23   Sondra Barges, PA-C  carvedilol (COREG) 6.25 MG tablet Take 1 tablet (6.25 mg total) by mouth 2 (two) times daily with a meal. Patient not taking: Reported on 04/04/2023 02/04/23   Sondra Barges, PA-C  Continuous Glucose Sensor (FREESTYLE LIBRE 3 SENSOR) MISC Place 1 sensor on the skin every 14 days. Use to check glucose continuously Patient not taking: Reported on 04/04/2023 01/14/23   Margarita Mail, DO  dapagliflozin propanediol (FARXIGA) 10 MG TABS tablet Take 1 tablet (10 mg total) by mouth once daily. Patient not taking: Reported on 04/04/2023 02/17/23   Margarita Mail, DO  digoxin  (LANOXIN) 0.125 MG tablet Take 0.5 tablets (0.0625 mg total) by mouth daily. Patient not taking: Reported on 04/04/2023 12/23/22   Sondra Barges, PA-C  sacubitril-valsartan (ENTRESTO) 97-103 MG Take 1 tablet by mouth 2 (two) times daily. Patient not taking: Reported on 04/04/2023 03/10/23   Sondra Barges, PA-C  Semaglutide,0.25 or 0.5MG /DOS, (OZEMPIC, 0.25 OR 0.5 MG/DOSE,) 2 MG/3ML SOPN Inject 0.5 mg into the skin once a week. Patient not taking: Reported on 04/04/2023 02/17/23   Margarita Mail, DO  torsemide (DEMADEX) 20 MG tablet Take 1 tablet (20 mg total) by mouth 2 (two) times daily. 04/04/23   Dunn, Raymon Mutton, PA-C  Potassium Chloride 40 MEQ/15ML (20%) SOLN Take 40 mEq by mouth 2 (two) times daily. 07/04/20 10/18/20  Debbe Odea, MD    Past Medical History: Past Medical History:  Diagnosis Date   Acid reflux    Chronic HFrEF (heart failure with reduced ejection fraction) (HCC)    a. 10/2019 Echo: EF 35-40%, GrII DD; b. 05/2020 Echo: EF 20-25%, glob HK; c. 10/2020 Echo: EF 30-35%, glob HK. GrII DD, Mildly red RV fxn. Mod TR; d.  05/2021 cMRI: EF 42%, no LGE. Nl RV size/fxn.   CKD (chronic kidney disease), stage II    Diabetes mellitus (HCC)    H/O medication noncompliance    Hypertension    Microcytic anemia    NICM (nonischemic cardiomyopathy) (HCC)    a. 10/2019 Echo: EF 35-40%; b. 10/2019 MV: No ischemia. Small apical defect-->breast attenuation; c.  05/2020 Echo: EF 20-25%; d. 10/2020 Echo: EF 30-35%, glob HK. GrII DD, Mildly red RV fxn. Mod TR; e. 05/2021 cMRI: EF 42%, no LGE. Nl RV size/fxn.   Obesity    Polysubstance abuse [facility])     Past Surgical History: Past Surgical History:  Procedure Laterality Date   CHOLECYSTECTOMY     HERNIA REPAIR     RIGHT HEART CATH N/A 07/28/2022   Procedure: RIGHT HEART CATH;  Surgeon: Iran Ouch, MD;  Location: [facility] INVASIVE CV LAB;  Service: Cardiovascular;  Laterality: N/A;    Family History: Family History  Problem Relation Age of Onset    Heart failure Mother        Onset of heart failure 68s.  Died in 05-08-22.   Diabetes Mother    Hypertension Father    Diabetes Father     Social History: Social History   Socioeconomic History   Marital status: Single    Spouse name: Not on file   Number of children: 0   Years of education: Not on file   Highest education level: 12th grade  Occupational History   Occupation: Diaabled  Tobacco Use   Smoking status: Former    Current packs/day: 0.00    Types: Cigarettes    Quit date: 05/08/2021    Years since quitting: 2.9   Smokeless tobacco: Never  Vaping Use   Vaping status: Never Used  Substance and Sexual Activity   Alcohol use: Not Currently   Drug use: Not Currently    Types: Cocaine    Comment: Admits to using cocaine up and will July 2024.   Sexual activity: Not Currently    Birth control/protection: None  Other Topics Concern   Not on file  Social History Narrative   Lives locally.  Does not routinely exercise.  Has been using cocaine.   Social Drivers of Corporate investment banker Strain: High Risk (04/15/2023)   Overall Financial Resource Strain (CARDIA)    Difficulty of Paying Living Expenses: Very hard  Food Insecurity: No Food Insecurity (04/16/2023)   Hunger Vital Sign    Worried About Running Out of Food in the Last Year: Never true    Ran Out of Food in the Last Year: Never true  Recent Concern: Food Insecurity - Food Insecurity Present (04/04/2023)   Hunger Vital Sign    Worried About Running Out of Food in the Last Year: Sometimes true    Ran Out of Food in the Last Year: Sometimes true  Transportation Needs: No Transportation Needs (04/15/2023)   PRAPARE - Administrator, Civil Service (Medical): No    Lack of Transportation (Non-Medical): No  Physical Activity: Not on file  Stress: Not on file  Social Connections: Not on file    Allergies:  Allergies  Allergen Reactions   No [employer] Food Allergies Rash and Other (See  Comments)    Lemons    Objective:    Vital Signs:   Temp:  [97.8 F (36.6 C)-98.7 F (37.1 C)] 98.7 F (37.1 C) (12/17 0436) Pulse Rate:  [72-76] 76 (12/17 0436) Resp:  [16-20] 17 (12/17 0436) BP: (130-139)/(78-85) 134/79 (12/17 0436) SpO2:  [95 %-100 %] 99 % (12/17 0436)    Weight change: Filed Weights   04/14/23 2327 04/18/23 0523  Weight: 92 kg 98.6 kg    Intake/Output:   Intake/Output Summary (Last 24 hours) at 04/19/2023 0918 Last data filed at 04/19/2023 0900 Gross per 24 hour  Intake 240  ml  Output --  Net 240 ml      Physical Exam    General:  In the chair.  No resp difficulty HEENT: normal Neck: supple. JVP 7-8 . Carotids 2+ bilat; no bruits. No lymphadenopathy or thyromegaly appreciated. Cor: PMI nondisplaced. Regular rate & rhythm. No rubs, gallops or murmurs. Lungs: clear Abdomen: soft, nontender, nondistended. No hepatosplenomegaly. No bruits or masses. Good bowel sounds. Extremities: no cyanosis, clubbing, rash, edema Neuro: alert & orientedx3, cranial nerves grossly intact. moves all 4 extremities w/o difficulty. Affect pleasant   Telemetry   SR  EKG    Admit SR 96 bpm   Labs   Basic Metabolic Panel: Recent Labs  Lab 04/15/23 0509 04/16/23 0612 04/17/23 0502 04/18/23 0621 04/19/23 0514  NA 133* 138 137 137 137  K 3.9 4.1 4.7 4.4 3.9  CL 102 105 101 101 98  CO2 24 27 31 29 29   GLUCOSE 167* 143* 168* 166* 193*  BUN 23* 24* 34* 34* 34*  CREATININE 1.09* 1.16* 1.15* 1.14* 1.14*  CALCIUM 7.3* 7.6* 7.7* 8.1* 8.3*  MG  --   --  2.0  --  2.1    Liver Function Tests: No results for input(s): "AST", "ALT", "ALKPHOS", "BILITOT", "PROT", "ALBUMIN" in the last 168 hours. No results for input(s): "LIPASE", "AMYLASE" in the last 168 hours. No results for input(s): "AMMONIA" in the last 168 hours.  CBC: Recent Labs  Lab 04/14/23 2334 04/15/23 0509 04/16/23 0612 04/17/23 0502 04/18/23 0621  WBC 6.9 6.2 5.6 5.8 6.5  HGB 9.8* 8.7*  8.7* 8.6* 8.6*  HCT 30.9* 27.0* 27.4* 27.6* 27.0*  MCV 98.7 96.1 97.2 95.8 96.4  PLT 341 316 327 370 394    Cardiac Enzymes: No results for input(s): "CKTOTAL", "CKMB", "CKMBINDEX", "TROPONINI" in the last 168 hours.  BNP: BNP (last 3 results) Recent Labs    07/24/22 1619 11/05/22 1953 04/14/23 2334  BNP 1,400.4* 1,267.5* 1,519.4*    ProBNP (last 3 results) No results for input(s): "PROBNP" in the last 8760 hours.   CBG: No results for input(s): "GLUCAP" in the last 168 hours.  Coagulation Studies: No results for input(s): "LABPROT", "INR" in the last 72 hours.   Imaging   No results found.   Medications:     Current Medications:  atorvastatin  20 mg Oral Daily   carvedilol  6.25 mg Oral BID WC   dapagliflozin propanediol  10 mg Oral Daily   digoxin  0.0625 mg Oral Daily   enoxaparin (LOVENOX) injection  45 mg Subcutaneous Q24H   furosemide  40 mg Intravenous TID   sacubitril-valsartan  1 tablet Oral BID    Infusions:     Patient Profile   Ms Bacote is a 38 year old with a history of  biventricular HFrEF, DMII, NSVT, PSVT, mod/sever TR, cocaine abuse, and anemia.   Admitted with A/C HFrEF   Assessment/Plan   1. A/C Biventricular HFrEF, NICM  Echo 07/2022 EF 25-30% RV mildly reduced. Reduced EF for the last few years.  NYHA III. Volume status improved. Stop IV lasix. Start torsemide 40 mg daily.  GDMT- Continue coreg , entresto, and farxiga.  Add 12.5 mg spiro daily . Previously stopped due to hyperkalemia. Will follow labs.  Of note ICD deferred with exacerbation Dr Gasper Lloyd   2. Primary HTN  Controlled. Continue current regimen. Adding spironolactone.   3. Anemia  Hgb 8.6  Iron stats 9  Iron 22 Ferritin 44 Meets criteria for  IV Iron based  on ACC/ AHA guidelines.  Consider Venofer  4. H/O Substance Abuse Last used cocaine over the summer.  UDS negative.   5. SDOH She will be at high risk for readmits. She will need assistance with  transportation. Will consult TOC.  We have scheduled HF follow up for next week.   Length of Stay: 4  Tavonte Seybold, NP  04/19/2023, 9:18 AM  Advanced Heart Failure Team Pager [phone] (M-F; 7a - 5p)  Please contact [facility] for night-coverage after hours (4p -7a ) and weekends on [email or web address]

## 2023-04-19 NOTE — Progress Notes (Signed)
PROGRESS NOTE    Molly Howard   ZOX:[id number] DOB: May 05, 1985  DOA: 04/15/2023 Date of Service: 04/19/23 which is hospital day 4  PCP: Margarita Mail, DO    HPI: Molly Howard is a 37 y.o. African-American female with medical history significant for combined systolic and diastolic CHF, stage II chronic kidney disease, type 2 diabetes mellitus, hypertension, and nonischemic cardiomyopathy, GERD as well as polysubstance abuse, who presented to the emergency room with acute onset of worsening dyspnea with associated bilateral lower extremity edema which have been getting worse over the last week.  She admits to orthopnea and paroxysmal nocturnal dyspnea as well as dyspnea on exertion.   Hospital course / significant events:  12/13: admitted to hospitalist service for CHF exacerbation.  12/14-12/16: continue diuresis per cardiology   Consultants:  cardiology  Procedures/Surgeries: none      ASSESSMENT & PLAN:   Acute on chronic combined systolic and diastolic CHF (congestive heart failure)  EF 25-30% in 07/2022  Unable to adhere to medications recently due to kicked out of home Out 1.75 liters last 24 Cardiology following Continue diuresis, iv lasix transitioned to oral today, torsemide Continue Coreg, Entresto, Farxiga, digoxin. Cleda Daub added D/c tomorrow if stable ICD placement will need to be re-scheduled (was scheduled for tomorrow)   Chronic anemia Hgb stable   Controlled type 2 diabetes mellitus without complication, without long-term current use of insulin (HCC) controlled continued on Farxiga.   Dyslipidemia statin   Essential hypertension controlled Regimen as above   Class 1 obesity based on BMI: Body mass index is 31.77 kg/m.     DVT prophylaxis: lovenox IV fluids: no continuous IV fluids  Nutrition: cardiac diet Central lines / invasive devices: none  Code Status: FULL CODE ACP documentation reviewed:  none on file in [facility]  TOC needs:  none at this time Barriers to dispo / significant pending items: diuresing, cardiology following              Subjective / Brief ROS:  Reports legs remain swollen but improving, no dyspnea or chest pain, no cough or fever, tolerating diet. Reports progress from yesterday.  Family Communication:  none    Objective Findings:  Vitals:   04/18/23 2341 04/19/23 0436 04/19/23 1020 04/19/23 1245  BP: 130/79 134/79 124/79 131/83  Pulse: 73 76 74 75  Resp: 17 17 16 16   Temp: 97.9 F (36.6 C) 98.7 F (37.1 C) 98.3 F (36.8 C) 99 F (37.2 C)  TempSrc:      SpO2: 100% 99% 97% 97%  Weight:      Height:        Intake/Output Summary (Last 24 hours) at 04/19/2023 1441 Last data filed at 04/19/2023 1200 Gross per 24 hour  Intake 600 ml  Output 1600 ml  Net -1000 ml   Filed Weights   04/14/23 2327 04/18/23 0523  Weight: 92 kg 98.6 kg    Examination:  NAD Rales at bases otherwise clear Distant heart sounds, s1/s2, systolic murmur Abdomen obese, non-distended and not tender Pitting edema to thighs, improving Ext warm       Scheduled Medications:   atorvastatin  20 mg Oral Daily   carvedilol  6.25 mg Oral BID WC   dapagliflozin propanediol  10 mg Oral Daily   digoxin  0.0625 mg Oral Daily   enoxaparin (LOVENOX) injection  45 mg Subcutaneous Q24H   sacubitril-valsartan  1 tablet Oral BID   spironolactone  12.5 mg Oral Daily   torsemide  40 mg Oral Daily    Continuous Infusions:   PRN Medications:  acetaminophen **OR** acetaminophen, benzonatate, cyclobenzaprine, guaiFENesin-dextromethorphan, magnesium hydroxide, ondansetron **OR** ondansetron (ZOFRAN) IV, traZODone  Antimicrobials from admission:  Anti-infectives (From admission, onward)    None           Data Reviewed:  I have personally reviewed the following...  CBC: Recent Labs  Lab 04/14/23 2334 04/15/23 0509 04/16/23 0612 04/17/23 0502 04/18/23 0621  WBC 6.9 6.2 5.6 5.8 6.5  HGB  9.8* 8.7* 8.7* 8.6* 8.6*  HCT 30.9* 27.0* 27.4* 27.6* 27.0*  MCV 98.7 96.1 97.2 95.8 96.4  PLT 341 316 327 370 394   Basic Metabolic Panel: Recent Labs  Lab 04/15/23 0509 04/16/23 0612 04/17/23 0502 04/18/23 0621 04/19/23 0514  NA 133* 138 137 137 137  K 3.9 4.1 4.7 4.4 3.9  CL 102 105 101 101 98  CO2 24 27 31 29 29   GLUCOSE 167* 143* 168* 166* 193*  BUN 23* 24* 34* 34* 34*  CREATININE 1.09* 1.16* 1.15* 1.14* 1.14*  CALCIUM 7.3* 7.6* 7.7* 8.1* 8.3*  MG  --   --  2.0  --  2.1   GFR: Estimated Creatinine Clearance: 80.7 mL/min (A) (by C-G formula based on SCr of 1.14 mg/dL (H)). Liver Function Tests: No results for input(s): "AST", "ALT", "ALKPHOS", "BILITOT", "PROT", "ALBUMIN" in the last 168 hours. No results for input(s): "LIPASE", "AMYLASE" in the last 168 hours. No results for input(s): "AMMONIA" in the last 168 hours. Coagulation Profile: No results for input(s): "INR", "PROTIME" in the last 168 hours. Cardiac Enzymes: No results for input(s): "CKTOTAL", "CKMB", "CKMBINDEX", "TROPONINI" in the last 168 hours. BNP (last 3 results) No results for input(s): "PROBNP" in the last 8760 hours. HbA1C: No results for input(s): "HGBA1C" in the last 72 hours. CBG: No results for input(s): "GLUCAP" in the last 168 hours. Lipid Profile: No results for input(s): "CHOL", "HDL", "LDLCALC", "TRIG", "CHOLHDL", "LDLDIRECT" in the last 72 hours. Thyroid Function Tests: No results for input(s): "TSH", "T4TOTAL", "FREET4", "T3FREE", "THYROIDAB" in the last 72 hours. Anemia Panel: No results for input(s): "VITAMINB12", "FOLATE", "FERRITIN", "TIBC", "IRON", "RETICCTPCT" in the last 72 hours.  Most Recent Urinalysis On File:  No results found for: "COLORURINE", "APPEARANCEUR", "LABSPEC", "PHURINE", "GLUCOSEU", "HGBUR", "BILIRUBINUR", "KETONESUR", "PROTEINUR", "UROBILINOGEN", "NITRITE", "LEUKOCYTESUR" Sepsis Labs: @LABRCNTIP (procalcitonin:4,lacticidven:4) Microbiology: No results found  for this or any previous visit (from the past 240 hours).    Radiology Studies last 3 days: No results found.    Silvano Bilis, MD [facility] 04/19/2023, 2:41 PM     If 7PM-7AM, please contact night coverage [email or web address]

## 2023-04-19 NOTE — TOC Initial Note (Signed)
Transition of Care [facility]) - Initial/Assessment Note    Patient Details  Name: Molly Howard MRN: [id number] Date of Birth: January 20, 1986  Transition of Care [facility]) CM/SW Contact:    Truddie Hidden, RN Phone Number: 04/19/2023, 4:17 PM  Clinical Narrative:                 Risk assessment complete  Admitted for: CHF  Admitted from: Home. Will be discharging back to [employer] with her sister  PCP: Dr. Margarita Mail Pharmacy:[facility]  Current home health/prior home health/DME:None Transportation: sister will transport.   Patient advised resources for housing, homeless sheltersf, and substance abuse was added to AVS.  Expected Discharge Plan: Home/Self Care Barriers to Discharge: Continued Medical Work up   Patient Goals and CMS Choice Patient states their goals for this hospitalization and ongoing recovery are:: home          Expected Discharge Plan and Services       Living arrangements for the past 2 months: Single Family Home                                      Prior Living Arrangements/Services Living arrangements for the past 2 months: Single Family Home Lives with:: Self Patient language and need for interpreter reviewed:: Yes Do you feel safe going back to the place where you live?: Yes      Need for Family Participation in Patient Care: Yes (Comment) Care giver support system in place?: Yes (comment)   Criminal Activity/Legal Involvement Pertinent to Current Situation/Hospitalization: No - Comment as needed  Activities of Daily Living   ADL Screening (condition at time of admission) Independently performs ADLs?: Yes (appropriate for developmental age) Is the patient deaf or have difficulty hearing?: No Does the patient have difficulty seeing, even when wearing glasses/contacts?: No Does the patient have difficulty concentrating, remembering, or making decisions?: No  Permission Sought/Granted                  Emotional  Assessment Appearance:: Appears stated age Attitude/Demeanor/Rapport: Gracious, Engaged Affect (typically observed): Accepting Orientation: : Oriented to Self, Oriented to Place, Oriented to  Time, Oriented to Situation Alcohol / Substance Use: Not Applicable Psych Involvement: No (comment)  Admission diagnosis:  Acute on chronic combined systolic and diastolic CHF (congestive heart failure) (HCC) [I50.43] Congestive heart failure, unspecified HF chronicity, unspecified heart failure type (HCC) [I50.9] Patient Active Problem List   Diagnosis Date Noted   Primary hypertension 04/16/2023   Controlled type 2 diabetes mellitus without complication, without long-term current use of insulin (HCC) 04/15/2023   Cardiogenic shock (HCC) 11/14/2022   Bilateral leg edema 11/06/2022   NICM (nonischemic cardiomyopathy) (HCC) 07/31/2022   Hypocalcemia 07/25/2022   Leg edema 07/25/2022   Acute on chronic combined systolic and diastolic CHF (congestive heart failure) (HCC) 07/25/2022   GERD without esophagitis 07/24/2022   Cocaine abuse (HCC) 02/12/2022   Acute pulmonary edema (HCC)    Acute systolic heart failure (HCC) 02/09/2022   Syncope 02/09/2022   Menorrhagia with regular cycle 06/22/2021   Dyslipidemia 11/27/2020   Chronic kidney disease (CKD), stage II (mild) 11/27/2020   Hypomagnesemia 10/17/2020   Dyspnea 05/21/2020   Essential hypertension 05/20/2020   Diabetes mellitus, type II (HCC) 05/20/2020   Chronic anemia 05/20/2020   Reactive thrombocytosis 05/20/2020   Obesity (BMI 30-39.9) 05/20/2020   Chronic combined systolic and diastolic heart failure (HCC)  11/01/2019   Financial difficulties 11/01/2019   Medication management 11/01/2019   Lung nodule 11/01/2019   Nonischemic cardiomyopathy (HCC) 11/01/2019   Polysubstance abuse (HCC) 11/01/2019   Tobacco use 11/01/2019   Swelling    Symptomatic anemia 10/02/2019   PCP:  Margarita Mail, DO Pharmacy:   [facility] -  [facility] [address] [place] [address] Phone: [phone] Fax: [phone]     Social Drivers of Health (SDOH) Social History: SDOH Screenings   Food Insecurity: No Food Insecurity (04/16/2023)  Recent Concern: Food Insecurity - Food Insecurity Present (04/04/2023)  Housing: High Risk (04/16/2023)  Transportation Needs: No Transportation Needs (04/15/2023)  Utilities: Not At Risk (04/16/2023)  Alcohol Screen: Low Risk  (04/15/2023)  Depression (PHQ2-9): Low Risk  (03/02/2023)  Financial Resource Strain: High Risk (04/15/2023)  Tobacco Use: Medium Risk (04/15/2023)   SDOH Interventions: Housing Interventions: Inpatient TOC Transportation Interventions: Other (Comment) (Pt uses Medicaid for transportation) Alcohol Usage Interventions: Intervention Not Indicated (Score <7) Financial Strain Interventions: Inpatient TOC   Readmission Risk Interventions    11/08/2022    3:23 PM 07/25/2022   10:35 AM 02/15/2022    2:34 PM  Readmission Risk Prevention Plan  Transportation Screening Complete Complete Complete  PCP or Specialist Appt within 3-5 Days  Complete Complete  Social Work Consult for Recovery Care Planning/Counseling  Complete Complete  Palliative Care Screening  Not Applicable Not Applicable  Medication Review Oceanographer) Complete Complete Complete  PCP or Specialist appointment within 3-5 days of discharge Complete    SW Recovery Care/Counseling Consult Complete    Palliative Care Screening Not Applicable    Skilled Nursing Facility Not Applicable

## 2023-04-19 NOTE — Telephone Encounter (Signed)
Patient Product/process development scientist completed.    The patient is insured through [facility] MEDICAID.     Ran test claim for Farxiga 10 mg and the current 30 day co-pay is $4.00.   This test claim was processed through [facility]- copay amounts may vary at other pharmacies due to pharmacy/plan contracts, or as the patient moves through the different stages of their insurance plan.     Molly Howard, CPHT Pharmacy Technician III Certified Patient Advocate [facility] Pharmacy Patient Advocate Team Direct Number: [phone]  Fax: [phone]

## 2023-04-19 NOTE — Plan of Care (Signed)

## 2023-04-19 NOTE — Plan of Care (Signed)
  Problem: Education: Goal: Knowledge of General Education information will improve Description: Including pain rating scale, medication(s)/side effects and non-pharmacologic comfort measures 04/19/2023 1246 by Ansel Bong, RN Outcome: Progressing 04/19/2023 1246 by Ansel Bong, RN Outcome: Progressing   Problem: Health Behavior/Discharge Planning: Goal: Ability to manage health-related needs will improve 04/19/2023 1246 by Ansel Bong, RN Outcome: Progressing 04/19/2023 1246 by Ansel Bong, RN Outcome: Progressing   Problem: Clinical Measurements: Goal: Ability to maintain clinical measurements within normal limits will improve 04/19/2023 1246 by Ansel Bong, RN Outcome: Progressing 04/19/2023 1246 by Ansel Bong, RN Outcome: Progressing Goal: Will remain free from infection 04/19/2023 1246 by Ansel Bong, RN Outcome: Progressing 04/19/2023 1246 by Ansel Bong, RN Outcome: Progressing Goal: Diagnostic test results will improve 04/19/2023 1246 by Ansel Bong, RN Outcome: Progressing 04/19/2023 1246 by Ansel Bong, RN Outcome: Progressing Goal: Respiratory complications will improve 04/19/2023 1246 by Ansel Bong, RN Outcome: Progressing 04/19/2023 1246 by Ansel Bong, RN Outcome: Progressing Goal: Cardiovascular complication will be avoided 04/19/2023 1246 by Ansel Bong, RN Outcome: Progressing 04/19/2023 1246 by Ansel Bong, RN Outcome: Progressing

## 2023-04-20 ENCOUNTER — Other Ambulatory Visit: Payer: Self-pay

## 2023-04-20 DIAGNOSIS — I5043 Acute on chronic combined systolic (congestive) and diastolic (congestive) heart failure: Secondary | ICD-10-CM | POA: Diagnosis not present

## 2023-04-20 LAB — DIGOXIN LEVEL: Digoxin Level: 0.3 ng/mL — ABNORMAL LOW (ref 0.8–2.0)

## 2023-04-20 LAB — BASIC METABOLIC PANEL
Anion gap: 11 (ref 5–15)
BUN: 34 mg/dL — ABNORMAL HIGH (ref 6–20)
CO2: 31 mmol/L (ref 22–32)
Calcium: 8.1 mg/dL — ABNORMAL LOW (ref 8.9–10.3)
Chloride: 95 mmol/L — ABNORMAL LOW (ref 98–111)
Creatinine, Ser: 0.96 mg/dL (ref 0.44–1.00)
GFR, Estimated: >60 mL/min (ref >60)
Glucose, Bld: 146 mg/dL — ABNORMAL HIGH (ref 70–99)
Potassium: 4 mmol/L (ref 3.5–5.1)
Sodium: 137 mmol/L (ref 135–145)

## 2023-04-20 LAB — MAGNESIUM: Magnesium: 2.1 mg/dL (ref 1.7–2.4)

## 2023-04-20 MED ORDER — IRON SUCROSE 500 MG IVPB - SIMPLE MED
500.0000 mg | Freq: Once | INTRAVENOUS | Status: AC
  Administered 2023-04-20: 500 mg via INTRAVENOUS
  Filled 2023-04-20: qty 275

## 2023-04-20 MED ORDER — SPIRONOLACTONE 25 MG PO TABS
25.0000 mg | ORAL_TABLET | Freq: Every day | ORAL | Status: DC
Start: 2023-04-21 — End: 2023-04-20

## 2023-04-20 MED ORDER — SPIRONOLACTONE 25 MG PO TABS
25.0000 mg | ORAL_TABLET | Freq: Every day | ORAL | 1 refills | Status: DC
  Filled 2023-04-20: qty 30, 30d supply, fill #0
  Filled 2023-06-03 – 2023-06-06 (×2): qty 30, 30d supply, fill #1

## 2023-04-20 MED ORDER — TORSEMIDE 20 MG PO TABS
40.0000 mg | ORAL_TABLET | Freq: Every day | ORAL | 1 refills | Status: DC
  Filled 2023-04-20: qty 60, 30d supply, fill #0
  Filled 2023-06-03: qty 60, 30d supply, fill #1

## 2023-04-20 MED ORDER — DAPAGLIFLOZIN PROPANEDIOL 10 MG PO TABS
10.0000 mg | ORAL_TABLET | Freq: Every day | ORAL | 1 refills | Status: DC
  Filled 2023-04-20: qty 30, 30d supply, fill #0
  Filled 2023-06-03 – 2023-06-06 (×2): qty 30, 30d supply, fill #1

## 2023-04-20 MED ORDER — DIGOXIN 125 MCG PO TABS
0.0625 mg | ORAL_TABLET | Freq: Every day | ORAL | 3 refills | Status: DC
  Filled 2023-04-20: qty 30, 60d supply, fill #0
  Filled 2023-06-03 – 2023-06-06 (×2): qty 30, 60d supply, fill #1

## 2023-04-20 MED ORDER — SACUBITRIL-VALSARTAN 97-103 MG PO TABS
1.0000 | ORAL_TABLET | Freq: Two times a day (BID) | ORAL | 1 refills | Status: DC
  Filled 2023-04-20: qty 60, 30d supply, fill #0
  Filled 2023-06-03 – 2023-06-06 (×2): qty 60, 30d supply, fill #1

## 2023-04-20 MED ORDER — ATORVASTATIN CALCIUM 20 MG PO TABS
20.0000 mg | ORAL_TABLET | Freq: Every day | ORAL | 1 refills | Status: DC
  Filled 2023-04-20: qty 30, 30d supply, fill #0

## 2023-04-20 MED ORDER — CARVEDILOL 12.5 MG PO TABS: 12.5000 mg | ORAL_TABLET | Freq: Two times a day (BID) | ORAL | Status: DC

## 2023-04-20 MED ORDER — CARVEDILOL 12.5 MG PO TABS
12.5000 mg | ORAL_TABLET | Freq: Two times a day (BID) | ORAL | 1 refills | Status: DC
  Filled 2023-04-20: qty 60, 30d supply, fill #0
  Filled 2023-06-03 – 2023-06-06 (×2): qty 60, 30d supply, fill #1

## 2023-04-20 NOTE — Discharge Summary (Signed)
Molly Howard QMV:[id number] DOB: 02-25-1986 DOA: 04/15/2023  PCP: Margarita Mail, DO  Admit date: 04/15/2023 Discharge date: 04/20/2023  Time spent: 35 minutes  Recommendations for Outpatient Follow-up:  Chf clinic f/u next week as scheduled Pcp f/u, consider anemia w/u    Discharge Diagnoses:  Principal Problem:   Acute on chronic combined systolic and diastolic CHF (congestive heart failure) (HCC) Active Problems:   Symptomatic anemia   Controlled type 2 diabetes mellitus without complication, without long-term current use of insulin (HCC)   Essential hypertension   Dyslipidemia   Acute systolic heart failure (HCC)   Primary hypertension   Discharge Condition: stable  Diet recommendation: heart healthy  Filed Weights   04/14/23 2327 04/18/23 0523 04/20/23 0807  Weight: 92 kg 98.6 kg 92.1 kg    History of present illness:  From admission h and p Molly Howard is a 37 y.o. African-American female with medical history significant for combined systolic and diastolic CHF, stage II chronic kidney disease, type 2 diabetes mellitus, hypertension, and nonischemic cardiomyopathy, GERD as well as polysubstance abuse, who presented to the emergency room with acute onset of worsening dyspnea with associated bilateral lower extremity edema which have been getting worse over the last week.  She admits to orthopnea and paroxysmal nocturnal dyspnea as well as dyspnea on exertion.  She has been having dry cough and wheezing.  No fever no headache she has not been able to get her torsemide over the last week as she stated she was kicked out of her house and did not have it with her.  She admitted to associated cough.  She is scheduled to have a pacemaker placement later this month.  No nausea or vomiting or abdominal pain.  No chest pain or palpitations.  No fever or chills.  No dysuria, oliguria or hematuria or flank pain.   Hospital Course:  Patient presents with fluid overload from  bi-ventriculrar hfref, secondary to medication non-compliance. Treated with IV diuresis. No o2 requirement. Seen by advanced heart failure team. Discharged on coreg, entresto, farxiga, spiro. Venofer ordered prior to d/c for stable iron deficiency anemia, will need ongoing evaluation of that. ICD placement will need to be rescheduled as was scheduled during this hospitalization - hf team aware and will coordinate. UDS negative for cocaine and she reports abstinence for several months - cocaine use has been a big barrier to care thus far. Also with food insecurity, SW provided patient with resources.  Procedures: none   Consultations: Heart failure team  Discharge Exam: Vitals:   04/20/23 0316 04/20/23 0807  BP: 132/86 (!) 146/95  Pulse: 71 77  Resp: 17 16  Temp: 97.7 F (36.5 C) 97.9 F (36.6 C)  SpO2: 95% 95%    General: NAD Cardiovascular: RRR, distant heart sounds, systolic murmur Respiratory: ctab Ext: LE edema much improved  Discharge Instructions   Discharge Instructions     Diet - low sodium heart healthy   Complete by: As directed    Increase activity slowly   Complete by: As directed       Allergies as of 04/20/2023       Reactions   No Healthtouch Food Allergies Rash, Other (See Comments)   Lemons        Medication List     TAKE these medications    Accu-Chek Softclix Lancets lancets Use as instructed   atorvastatin 20 MG tablet Commonly known as: LIPITOR Take 1 tablet (20 mg total) by mouth daily.   carvedilol  12.5 MG tablet Commonly known as: COREG Take 1 tablet (12.5 mg total) by mouth 2 (two) times daily with a meal. What changed:  medication strength how much to take   dapagliflozin propanediol 10 MG Tabs tablet Commonly known as: FARXIGA Take 1 tablet (10 mg total) by mouth once daily.   digoxin 0.125 MG tablet Commonly known as: LANOXIN Take 0.5 tablets (0.0625 mg total) by mouth daily.   FreeStyle Libre 3 Sensor Misc Place 1  sensor on the skin every 14 days. Use to check glucose continuously   Ozempic (0.25 or 0.5 MG/DOSE) 2 MG/3ML Sopn Generic drug: Semaglutide(0.25 or 0.5MG /DOS) Inject 0.5 mg into the skin once a week.   sacubitril-valsartan 97-103 MG Commonly known as: ENTRESTO Take 1 tablet by mouth 2 (two) times daily.   spironolactone 25 MG tablet Commonly known as: ALDACTONE Take 1 tablet (25 mg total) by mouth daily. Start taking on: April 21, 2023   Torsemide 40 MG Tabs Take 40 mg by mouth daily for 30 doses. Start taking on: April 21, 2023 What changed:  medication strength how much to take when to take this       Allergies  Allergen Reactions   No Healthtouch Food Allergies Rash and Other (See Comments)    Lemons    Follow-up Information     [facility]. Go in 6 day(s).   Specialty: Cardiology Why: Hospital Follow-Up 04/25/23 @ 3:00 PM Please bring all medications to follow-up appointment [facility] Free Valet Parking at the Advertising account planner information: [address] [place] [address] [phone]        Margarita Mail, DO Follow up.   Specialty: Internal Medicine Contact information: [address] [place] [address] [phone]                  The results of significant diagnostics from this hospitalization (including imaging, microbiology, ancillary and laboratory) are listed below for reference.    Significant Diagnostic Studies: DG Chest 2 View Result Date: 04/14/2023 CLINICAL DATA:  Shortness of breath. EXAM: CHEST - 2 VIEW COMPARISON:  Chest radiograph dated 11/05/2022. FINDINGS: There is mild cardiomegaly with vascular congestion and mild interstitial edema. Pneumonia is not excluded. No consolidative changes. There is no pleural effusion or pneumothorax. No acute osseous pathology. IMPRESSION: Mild cardiomegaly  with vascular congestion and mild interstitial edema. Electronically Signed   By: Elgie Collard M.D.   On: 04/14/2023 23:49    Microbiology: No results found for this or any previous visit (from the past 240 hours).   Labs: Basic Metabolic Panel: Recent Labs  Lab 04/16/23 0612 04/17/23 0502 04/18/23 0621 04/19/23 0514 04/20/23 0508  NA 138 137 137 137 137  K 4.1 4.7 4.4 3.9 4.0  CL 105 101 101 98 95*  CO2 27 31 29 29 31   GLUCOSE 143* 168* 166* 193* 146*  BUN 24* 34* 34* 34* 34*  CREATININE 1.16* 1.15* 1.14* 1.14* 0.96  CALCIUM 7.6* 7.7* 8.1* 8.3* 8.1*  MG  --  2.0  --  2.1 2.1   Liver Function Tests: No results for input(s): "AST", "ALT", "ALKPHOS", "BILITOT", "PROT", "ALBUMIN" in the last 168 hours. No results for input(s): "LIPASE", "AMYLASE" in the last 168 hours. No results for input(s): "AMMONIA" in the last 168 hours. CBC: Recent Labs  Lab 04/14/23 2334 04/15/23 0509 04/16/23 0612 04/17/23 0502 04/18/23 0621  WBC 6.9 6.2 5.6 5.8  6.5  HGB 9.8* 8.7* 8.7* 8.6* 8.6*  HCT 30.9* 27.0* 27.4* 27.6* 27.0*  MCV 98.7 96.1 97.2 95.8 96.4  PLT 341 316 327 370 394   Cardiac Enzymes: No results for input(s): "CKTOTAL", "CKMB", "CKMBINDEX", "TROPONINI" in the last 168 hours. BNP: BNP (last 3 results) Recent Labs    07/24/22 1619 11/05/22 1953 04/14/23 2334  BNP 1,400.4* 1,267.5* 1,519.4*    ProBNP (last 3 results) No results for input(s): "PROBNP" in the last 8760 hours.  CBG: No results for input(s): "GLUCAP" in the last 168 hours.     Signed:  Silvano Bilis MD.  [facility] 04/20/2023, 10:33 AM

## 2023-04-20 NOTE — Plan of Care (Signed)
?  Problem: Clinical Measurements: ?Goal: Ability to maintain clinical measurements within normal limits will improve ?Outcome: Progressing ?Goal: Will remain free from infection ?Outcome: Progressing ?Goal: Diagnostic test results will improve ?Outcome: Progressing ?  ?

## 2023-04-20 NOTE — Patient Instructions (Signed)
Visit Information  Ms. Splawn was given information about Medicaid Managed Care team care coordination services as a part of their [facility] Community Plan Medicaid benefit. [name] verbally consented to engagement with the [facility] Managed Care team.   If you are experiencing a medical emergency, please call 911 or report to your local emergency department or urgent care.   If you have a non-emergency medical problem during routine business hours, please contact your provider's office and ask to speak with a nurse.   For questions related to your [facility], please call: [phone] or visit the homepage here: [email or web address]  If you would like to schedule transportation through your [facility], please call the following number at least 2 days in advance of your appointment: [phone]   Rides for urgent appointments can also be made after hours by calling Member Services.  Call the Behavioral Health Crisis Line at [phone], at any time, 24 hours a day, 7 days a week. If you are in danger or need immediate medical attention call 911.  If you would like help to quit smoking, call 1-800-QUIT-NOW (806-455-4207) OR Espaol: 1-855-Djelo-Ya (0-272-536-6440) o para ms informacin haga clic aqu or Text READY to 347-425 to register via text  [name] - following are the goals we discussed in your visit today:   Goals Addressed   None       Social Worker will follow up in 30 days.   Gus Puma, Kenard Gower, MHA [facility]  Managed Medicaid Social Worker [phone]   Following is a copy of your plan of care:  There are no care plans that you recently modified to display for this patient.

## 2023-04-20 NOTE — Patient Outreach (Signed)
Medicaid Managed Care Social Work Note  04/20/2023 Name:  Molly Howard MRN:  [id number] DOB:  11-20-1985  Molly Howard is an 37 y.o. year old female who is a primary patient of Margarita Mail, DO.  The Medicaid Managed Care Coordination team was consulted for assistance with:  Community Resources   [name] was given information about Medicaid Managed Care Coordination team services today. Molly Howard Patient agreed to services and verbal consent obtained.  Engaged with patient  for by telephone forfollow up visit in response to referral for case management and/or care coordination services.   Patient is participating in a Managed Medicaid Plan:  Yes  Assessments/Interventions:  Review of past medical history, allergies, medications, health status, including review of consultants reports, laboratory and other test data, was performed as part of comprehensive evaluation and provision of chronic care management services.  SDOH: (Social Drivers of Health) assessments and interventions performed: SDOH Interventions    Flowsheet Row ED to Hosp-Admission (Current) from 04/15/2023 in [facility] CARDI[facility] PCU Patient Outreach Telephone from 04/04/2023 in [facility]ALTH DEPARTMENT Patient Outreach Telephone from 11/24/2022 in  POPULATION HEALTH DEPARTMENT Telephone from 06/30/2020 in [facility] Telephone from 06/16/2020 in [facility]  SDOH Interventions       Food Insecurity Interventions -- Other (Comment)  [BSW referral] Other (Comment)  [working with BSW for food assistance] -- Other (Comment)  [will help with transportation to get to food pantries as that was the main barrier]  Housing Interventions Inpatient TOC Other (Comment)  [BSW referral] Intervention Not Indicated -- Other (Comment)  [referred to [employer] to apply for low income water assistance program]  Transportation Interventions Other  (Comment)  [Pt uses Medicaid for transportation] -- Payor Benefit  [Provided with UHC transportation [phone] [employer]  Utilities Interventions -- -- Intervention Not Indicated -- --  Alcohol Usage Interventions Intervention Not Indicated (Score <7) -- -- -- --  Financial Strain Interventions Inpatient TOC -- -- -- Other (Comment)  [providing [employer] gift cards to help with medication costs]      BSW completed a telephone outreach with patient, she states they are still in the hotel and need assistance with the hotel cost. BSW resent housing resources for patient to get assistance with hotel cost.  Advanced Directives Status:  Not addressed in this encounter.  Care Plan                 Allergies  Allergen Reactions   No Healthtouch Food Allergies Rash and Other (See Comments)    Lemons    Medications Reviewed Today   Medications were not reviewed in this encounter     Patient Active Problem List   Diagnosis Date Noted   Primary hypertension 04/16/2023   Controlled type 2 diabetes mellitus without complication, without long-term current use of insulin (HCC) 04/15/2023   Cardiogenic shock (HCC) 11/14/2022   Bilateral leg edema 11/06/2022   NICM (nonischemic cardiomyopathy) (HCC) 07/31/2022   Hypocalcemia 07/25/2022   Leg edema 07/25/2022   Acute on chronic combined systolic and diastolic CHF (congestive heart failure) (HCC) 07/25/2022   GERD without esophagitis 07/24/2022   Cocaine abuse (HCC) 02/12/2022   Acute pulmonary edema (HCC)    Acute systolic heart failure (HCC) 02/09/2022   Syncope 02/09/2022   Menorrhagia with regular cycle 06/22/2021   Dyslipidemia 11/27/2020   Chronic kidney disease (CKD), stage II (mild) 11/27/2020   Hypomagnesemia  10/17/2020   Dyspnea 05/21/2020   Essential hypertension 05/20/2020   Diabetes mellitus, type II (HCC) 05/20/2020   Chronic anemia 05/20/2020   Reactive thrombocytosis  05/20/2020   Obesity (BMI 30-39.9) 05/20/2020   Chronic combined systolic and diastolic heart failure (HCC) 11/01/2019   Financial difficulties 11/01/2019   Medication management 11/01/2019   Lung nodule 11/01/2019   Nonischemic cardiomyopathy (HCC) 11/01/2019   Polysubstance abuse (HCC) 11/01/2019   Tobacco use 11/01/2019   Swelling    Symptomatic anemia 10/02/2019    Conditions to be addressed/monitored per PCP order:   community resouces  There are no care plans that you recently modified to display for this patient.   Follow up:  Patient agrees to Care Plan and Follow-up.  Plan: The Managed Medicaid care management team will reach out to the patient again over the next 30 days.  Date/time of next scheduled Social Work care management/care coordination outreach:  05/23/23  Gus Puma, Kenard Gower, [facility]  Managed [facility] Social Worker [phone]

## 2023-04-20 NOTE — Progress Notes (Signed)
 This RN provided discharge instructions and teaching to the patient. The patient verbalized and demonstrated understanding of the provided instructions. All outstanding questions resolved. L arm PIV removed. Cannula intact. Pt tolerated well. All belongings packed and in tow.

## 2023-04-20 NOTE — TOC Transition Note (Signed)
Transition of Care [facility]) - Progression Note    Patient Details  Name: Molly Howard MRN: [id number] Date of Birth: 1986/01/31  Transition of Care [facility]) CM/SW Contact  Truddie Hidden, RN Phone Number: 04/20/2023, 2:22 PM  Clinical Narrative:    Cab voucher left at patient's bed.  TOC signing off.     Expected Discharge Plan: Home/Self Care Barriers to Discharge: Continued Medical Work up  Expected Discharge Plan and Services       Living arrangements for the past 2 months: Single Family Home Expected Discharge Date: 04/20/23                                     Social Determinants of Health (SDOH) Interventions SDOH Screenings   Food Insecurity: No Food Insecurity (04/16/2023)  Recent Concern: Food Insecurity - Food Insecurity Present (04/04/2023)  Housing: High Risk (04/16/2023)  Transportation Needs: No Transportation Needs (04/15/2023)  Utilities: Not At Risk (04/16/2023)  Alcohol Screen: Low Risk  (04/15/2023)  Depression (PHQ2-9): Low Risk  (03/02/2023)  Financial Resource Strain: High Risk (04/15/2023)  Tobacco Use: Medium Risk (04/15/2023)    Readmission Risk Interventions    11/08/2022    3:23 PM 07/25/2022   10:35 AM 02/15/2022    2:34 PM  Readmission Risk Prevention Plan  Transportation Screening Complete Complete Complete  PCP or Specialist Appt within 3-5 Days  Complete Complete  Social Work Consult for Recovery Care Planning/Counseling  Complete Complete  Palliative Care Screening  Not Applicable Not Applicable  Medication Review Oceanographer) Complete Complete Complete  PCP or Specialist appointment within 3-5 days of discharge Complete    SW Recovery Care/Counseling Consult Complete    Palliative Care Screening Not Applicable    Skilled Nursing Facility Not Applicable

## 2023-04-20 NOTE — TOC Progression Note (Addendum)
Transition of Care [facility]) - Progression Note    Patient Details  Name: Molly Howard MRN: [id number] Date of Birth: 1986-03-09  Transition of Care [facility]) CM/SW Contact  Truddie Hidden, RN Phone Number: 04/20/2023, 12:12 PM  Clinical Narrative:    Spoke with patient regarding discharge today. Patient will need transportation. She is not able to pay to a cab. [facility] arranged a taxi to [address] [place], [place], [place] [address] per patient request.    Expected Discharge Plan: Home/Self Care Barriers to Discharge: Continued Medical Work up  Expected Discharge Plan and Services       Living arrangements for the past 2 months: Single Family Home Expected Discharge Date: 04/20/23                                     Social Determinants of Health (SDOH) Interventions SDOH Screenings   Food Insecurity: No Food Insecurity (04/16/2023)  Recent Concern: Food Insecurity - Food Insecurity Present (04/04/2023)  Housing: High Risk (04/16/2023)  Transportation Needs: No Transportation Needs (04/15/2023)  Utilities: Not At Risk (04/16/2023)  Alcohol Screen: Low Risk  (04/15/2023)  Depression (PHQ2-9): Low Risk  (03/02/2023)  Financial Resource Strain: High Risk (04/15/2023)  Tobacco Use: Medium Risk (04/15/2023)    Readmission Risk Interventions    11/08/2022    3:23 PM 07/25/2022   10:35 AM 02/15/2022    2:34 PM  Readmission Risk Prevention Plan  Transportation Screening Complete Complete Complete  PCP or Specialist Appt within 3-5 Days  Complete Complete  Social Work Consult for Recovery Care Planning/Counseling  Complete Complete  Palliative Care Screening  Not Applicable Not Applicable  Medication Review Oceanographer) Complete Complete Complete  PCP or Specialist appointment within 3-5 days of discharge Complete    SW Recovery Care/Counseling Consult Complete    Palliative Care Screening Not Applicable    Skilled Nursing Facility Not Applicable

## 2023-04-20 NOTE — Progress Notes (Signed)
Advanced Heart Failure Rounding Note  Cardiologist: Molly Odea, MD   Subjective:   Chief Complaint: Heart Failure   Yesterday diuresed with IV lasix and started on spiro. Negative 1.5 liters.   Denies SOB.    Objective:   Weight Range: 98.6 kg Body mass index is 34.05 kg/m.   Vital Signs:   Temp:  [97.5 F (36.4 C)-99 F (37.2 C)] 97.7 F (36.5 C) (12/18 0316) Pulse Rate:  [68-75] 71 (12/18 0316) Resp:  [16-19] 17 (12/18 0316) BP: (124-144)/(79-88) 132/86 (12/18 0316) SpO2:  [93 %-100 %] 95 % (12/18 0316)    Weight change: Filed Weights   04/14/23 2327 04/18/23 0523  Weight: 92 kg 98.6 kg    Intake/Output:   Intake/Output Summary (Last 24 hours) at 04/20/2023 0725 Last data filed at 04/19/2023 1935 Gross per 24 hour  Intake 1000 ml  Output 2500 ml  Net -1500 ml      Physical Exam    General:  Sitting in the chair. No resp difficulty HEENT: normal Neck: supple. no JVD. Carotids 2+ bilat; no bruits. No lymphadenopathy or thryomegaly appreciated. Cor: PMI nondisplaced. Regular rate & rhythm. No rubs, gallops or murmurs. Lungs: clear on room air.  Abdomen: soft, nontender, nondistended. No hepatosplenomegaly. No bruits or masses. Good bowel sounds. Extremities: no cyanosis, clubbing, rash, edema Neuro: alert & orientedx3, cranial nerves grossly intact. moves all 4 extremities w/o difficulty. Affect pleasant   Telemetry   SR 60-70s   EKG    N/A  Labs    CBC Recent Labs    04/18/23 0621  WBC 6.5  HGB 8.6*  HCT 27.0*  MCV 96.4  PLT 394   Basic Metabolic Panel Recent Labs    84/69/62 0514 04/20/23 0508  NA 137 137  K 3.9 4.0  CL 98 95*  CO2 29 31  GLUCOSE 193* 146*  BUN 34* 34*  CREATININE 1.14* 0.96  CALCIUM 8.3* 8.1*  MG 2.1 2.1   Liver Function Tests No results for input(s): "AST", "ALT", "ALKPHOS", "BILITOT", "PROT", "ALBUMIN" in the last 72 hours. No results for input(s): "LIPASE", "AMYLASE" in the last 72  hours. Cardiac Enzymes No results for input(s): "CKTOTAL", "CKMB", "CKMBINDEX", "TROPONINI" in the last 72 hours.  BNP: BNP (last 3 results) Recent Labs    07/24/22 1619 11/05/22 1953 04/14/23 2334  BNP 1,400.4* 1,267.5* 1,519.4*    ProBNP (last 3 results) No results for input(s): "PROBNP" in the last 8760 hours.   D-Dimer No results for input(s): "DDIMER" in the last 72 hours. Hemoglobin A1C No results for input(s): "HGBA1C" in the last 72 hours. Fasting Lipid Panel No results for input(s): "CHOL", "HDL", "LDLCALC", "TRIG", "CHOLHDL", "LDLDIRECT" in the last 72 hours. Thyroid Function Tests No results for input(s): "TSH", "T4TOTAL", "T3FREE", "THYROIDAB" in the last 72 hours.  Invalid input(s): "FREET3"  Other results:   Imaging    No results found.   Medications:     Scheduled Medications:  atorvastatin  20 mg Oral Daily   carvedilol  6.25 mg Oral BID WC   dapagliflozin propanediol  10 mg Oral Daily   digoxin  0.0625 mg Oral Daily   enoxaparin (LOVENOX) injection  45 mg Subcutaneous Q24H   sacubitril-valsartan  1 tablet Oral BID   spironolactone  12.5 mg Oral Daily   torsemide  40 mg Oral Daily    Infusions:   PRN Medications: acetaminophen **OR** acetaminophen, benzonatate, cyclobenzaprine, guaiFENesin-dextromethorphan, magnesium hydroxide, ondansetron **OR** ondansetron (ZOFRAN) IV, traZODone  Patient Profile   Molly Howard is a 37 year old with a history of  biventricular HFrEF, DMII, NSVT, PSVT, mod/sever TR, cocaine abuse, and anemia.    Admitted with A/C HFrEF    Assessment/Plan  1. A/C Biventricular HFrEF, NICM  Echo 07/2022 EF 25-30% RV mildly reduced. Reduced EF for the last few years.  NYHA III. Volume status stable. Continue torsemide 40 mg daily.  GDMT-  -Increase coreg 12.5 mg twice a day  -Continue entresto 97-103 twice a day  - Continue farxiga 10 mg daily.   -Increase spiro to 25 mg daily. .  -Of note ICD deferred with  exacerbation . EP rescheduling.    2. Primary HTN  Elevated. Increasing spiro    3. Anemia  Hgb 8.6  Iron stats 9  Iron 22 Ferritin 44 Meets criteria for  IV Iron based on ACC/ AHA guidelines.   Give dose of Venofer prior to discharge   4. H/O Substance Abuse Last used cocaine over the summer.  UDS negative.    5. SDOH She will be at high risk for readmits.. Currently living in a motel. Marland Kitchen  She will need assistance with transportation. Will consult TOC.  We provided a list of local food pantries.   HF Meds for discharge.  Coreg 12.5 mg twice a day  Entresto 97-103 twice a day  Farxiga 10 mg daily  Spironolactone 25 mg daily  Torsemide 40 mg daily   HF follow up next week and is on the chart. She will need 30 day supply medications from [facility]. Meds to bed. Down the road she would benefit bubble packs once HF meds stabilized.     Length of Stay: 5  Molly Hlavaty, NP  04/20/2023, 7:25 AM  Advanced Heart Failure Team Pager [phone] (M-F; 7a - 5p)  Please contact [facility] for night-coverage after hours (5p -7a ) and weekends on [email or web address]

## 2023-04-20 NOTE — Progress Notes (Signed)
Heart Failure Navigator Progress Note  Patient provided with food pantry items, hat, gloves and socks prior to discharge. Also handouts for [employer], [employer], [employer], and [employer]. Patient aware of her upcoming follow-up appointment at the Heart Failure Clinic.  Navigator will sign off at this time.  Roxy Horseman, RN, BSN [facility] Heart Failure Navigator Secure Chat Only

## 2023-04-20 NOTE — Discharge Instructions (Addendum)
Rent/Utility/Housing  Agency Name: [facility] Address: [address] [place], [place] [address] Phone: [phone] Email: [email or web address] Website: [email or web address] Service(s) Offered: Housing services, self-sufficiency, congregate meal program, weatherization program, Field seismologist program, emergency food assistance,  housing counseling, home ownership program, wheels -towork program.  Agency Name: [facility] Address: [address] [place], [place] [address] Phone: [phone] (8a-4p) [phone] (8p- 10p) Email: [email or web address] Website: [email or web address] Service(s) Offered: A program for homeless and/or needy men that includes one-on-one counseling, life skills training and job rehabilitation.  Agency Name: [facility] [place] Address: [address], [place], [place] [address] Phone: [phone] Website: www.[email or web address].org Service(s) Offered: Assistance to needy in emergency with utility bills, heating fuel, and prescriptions. Shelter for homeless 7pm-7am. August 26, 2016 15  Agency Name: [facility] [place] (Developmentally Disabled) Address: [address], [place], [place] [address] Phone: [phone] Contact Person: Cathleen Corti Email: [email or web address] Website: [email or web address] Service(s) Offered: Helps individuals with developmental disabilities move from housing that is more restrictive to homes where they  can achieve greater independence and have more  opportunities.  Agency Name: [facility] Address: [address], [place], [place] [address] Phone: [phone] Email: [email or web address] Website: www.[email or web address].org Service(s) Offered: Provides affordable housing for low-income families, elderly, and disabled individuals. Offer a wide range of  programs and services, from financial planning to  afterschool and summer programs.  Agency Name: Department of Social Services Address: [address] [place], [place] [address] Phone: [phone] Service(s) Offered: Child support services; child welfare services; food stamps; Medicaid; work first family assistance; and aid with fuel,  rent, food and medicine.  Agency Name: [facility] [place], [employer]. Address: [facility] [address], [place], [place]  [address] Phone: [phone] Website: www.familyabuseservices.org Service(s) Offered: 24 hour Crisis Line: [phone]; 24 hour Emergency Shelter; Transitional Housing; Support Groups; Scientist, physiological; [employer]; Hispanic Outreach: 6057447059;  Visitation Center: [phone].  Agency Name: [facility] [place]. Address: [address] [place], [place] [address] Phone: [phone] Service(s) Offered: CAP Services; Home and [employer]; Individual or Group Supports; Respite Care Non-Institutional Nursing;  Residential Supports; Respite Care and Personal Care Services; Transportation; Family and Friends Night; Recreational Activities; Three Nutritious Meals/Snacks; Consultation with Registered Dietician; Twenty-four hour Registered Nurse Access; Daily and [employer]; Camp Green Leaves; [place] for the [employer] (During Summer Months) Bingo Night (Every  Wednesday Night); Special Populations Dance Night  (Every Tuesday Night); Professional Hair Care Services.  Agency Name: [facility] Address: [address], [place], [place] [address] Phone: [phone] Contact Person: Jabier Mutton Website: [email or web address].Physicist, medical) Offered: Residential treatment facility for women; food and  clothing, educational & employment development and  transportation to work; Counsellor of financial skills;  parenting and family reunification; emotional and spiritual  support;  transitional housing for program graduates.  Agency Name: [facility] Address: [address] [place], [place] [address] Phone: [phone] Email: [email or web address] Website: TaskTown.es Service(s) Offered: Public housing units for elderly, disabled, and low income people; housing choice vouchers for income eligible  applicants; shelter plus care vouchers; and Psychologist, clinical.  Agency Name: [facility] [employer] Address: [address], [place], [place] [address] Phone: [phone] Email: [email or web address] Website: www.habitatalamance.org Service(s) Offered: Build houses for families in need of decent housing. Each adult in the family must invest 200 hours of labor on  someone else's house, work with volunteers to build their own house, attend classes  on budgeting, home maintenance, yard care, and attend homeowner association meetings.  Agency Name: [facility] Address: [address], [place], [place] [address] Phone: [phone] Website: www.rsli.org Service(s) Offered: Intermediate care facilities for intellectually delayed, Supervised Living in group homes for adults with developmental disabilities, Supervised Living for people who have dual diagnoses (MRMI), Independent Living, Supported Living, respite and a variety of CAP services, pre-vocational services, day supports, and [employer].  Agency Name: [place] [employer] Prevention [employer] Phone: [phone] Website: www.NCForeclosurePrevention.gov Service(s) Offered: Zero-interest, deferred loans to homeowners struggling to pay their mortgage. Call for more information.   Shelters Resource List  [employer] RESCUE MISSION PROVIDED BY: [facility] [address] [place], [place], [place] Offers a faith-based shelter for homeless men, usually with substance use disorders. Residents receive counseling, life skills training, and help finding a  job.  HOMELESS SHELTER PROVIDED BY: ALLIED CHURCHES OF [facility] [address] [place], [place], [place] Offers a shelter for men, women, and families experiencing homelessness. Food, clothing and other items are available for residents. Also offers support and services to help residents become self-sufficient. Offers temporary emergency housing for 30 days. Additional shelter may be available when temperatures drop below freezing but is not guaranteed.  FAMILY ABUSE SERVICES OF [facility] PROVIDED BY: FAMILY ABUSE SERVICES OF [facility] [address], [place], [place] Offers services for victims of domestic violence. Offers a 24-hour crisis line and emergency shelter. Offers information and referrals to other community resources. Also offers court advocacy and support groups.  HOUSING CHOICE VOUCHER PROGRAM PROVIDED BY: [facility]OUSING AUTHORITY - [place] [address], [place], [place] Offers vouchers for approved Section 8 properties. Vouchers offer financial help with rent    FRUIT TREE MINISTRIES PROVIDED BY: [facility], [place], [place] Offers emergency shelter for victims of domestic violence. Also offers a 24-hour crisis hotline for victims of domestic violence, safety planning, information and referrals, case management, and support groups for victims of domestic violence.   ACT TOGETHER EMERGENCY SHELTER PROVIDED BY: [facility] [address], [place], [place] Offers a 21-day emergency shelter for youth experiencing a family crisis, abuse, or homelessness. Case management, supportive services, healthcare services, and more are available for residents. SHELTER PROVIDED BY: [facility] [address], [place], [place] Offers a homeless shelter for people and families. Meals, showers, community referrals, case management, and more are available for residents. HEARTH TRANSITIONAL LIVING PROGRAM PROVIDED BY: [facility] [address],  [place], [place] Offers an 42-month homeless shelter for younger adults experiencing homelessness. Case management, independent living skills education, and more are available for residents. PARTNERSHIP VILLAGE PROVIDED BY: [place] [facility] [address], [place], [place] Offers transitional housing for families and single people experiencing homelessness. Residents meet regularly with a case manager to work towards self-sufficiency TRANSITIONAL HOUSING PROVIDED BY: [facility] [address], [place], [place] Offers transitional housing for female veterans with disabilities. Residents receive meals, transportation, and clothing. Also offers support groups, nutrition classes, and peer support to residents.   WEAVER HOUSE PROVIDED BY: [place] [facility] [address], [place], [place] Offers shelter to adult men and women. Guests receive hot meals and case management. Also offers overnight shelter when temperatures drop during cold winter months  EMERGENCY FAMILY SHELTER PROVIDED BY: [facility] [address], [place], [place] Offers shelter and support services for families experiencing homelessness.   Intensive Outpatient Programs   [facility] [address] [place],  [place], [place] [phone]  [facility] (Inpatient and outpatient)[phone] (Suboxone and Methadone) [address] [phone]  ADS: Alcohol & Drug [facility] Programs - Intensive Outpatient [address] [place], [place] [address], [place]  [phone]  Fellowship Margo Aye (Outpatient, Inpatient, Chemical Caring Services (Groups and Residental) (insurance only) [phone] [place], [place] [phone]   [facility] (for caregivers and  family) [address] Laurell Josephs [address], [place], [place] [phone]  Residential Treatment Programs  [facility] Work Farm(2 years) Residential: 66 days)[facility] ([facility] [address] [place], [place], [place] [phone] or [phone]  D.R.E.A.M.S Treatment [facility] [address] [place], [place], [place] [phone]  [facility] Residential Treatment FacilityResidential Treatment Services (RTS) [address] [place], [place], [place] [phone] Admissions: 8am-3pm M-F  BATS Program: Residential Program [phone] Days)             ADATC: [facility]  [place], [place], [place]  [phone] or [phone] in Hours over the weekend or by referral)  [facility] [address] [place], [place] [address] [phone] (Do virtual or phone assessment, offer transportation within 25 miles, have in patient and Outpatient options)   Mobil Crisis: Therapeutic Alternatives:[phone] (for crisis response 24 hours a day)   Food Resources  Agency Name: [facility] Agency Address: [address], [place], [place] [address] Phone: [phone] Website: www.alamanceservices.org Service(s) Offered: Housing services, self-sufficiency, congregate meal program, weatherization program, Event organiser program, emergency food assistance,  housing counseling, home ownership program, wheels - to work program.  Dole Food free for 60 and older at various locations from [employer], Monday-Friday:  [employer], [address] [place], [phone] -[facility], [address], [place] [phone]  -[facility], [address] [place] [phone]  -[address] [place], [phone]  Agency Name: [facility] Address: [phone] [address], [place], [place] [address] Phone: [phone] Website: www.alamancemow.org Service(s) Offered: Home delivered hot, frozen, and emergency  meals. Grocery assistance program which matches  volunteers one-on-one with seniors unable to grocery shop  for themselves. Must be 60 years and older; less than 20  hours of in-home aide service, limited or no driving ability;  live alone or with someone with a disability; live in  [place].  Agency Name: [facility]ologist [facility]) Address: [address] [place], [place] [address] Phone: [phone] Service(s) Offered: Food is served to shut-ins, homeless, elderly, and low income people in the community every Saturday (11:30 am-12:30 pm) and Sunday (12:30 pm-1:30pm). Volunteers also offer help and encouragement in seeking employment,  and spiritual guidance.  Agency Name: Department of Social Services Address: [address] [place], [place] [address] Phone: [phone] Service(s) Offered: Child support services; child welfare services; food stamps; Medicaid; work first family assistance; and aid with fuel,  rent, food and medicine.  Agency Name: Dietitian Address: [address] [place], [place] Phone: [phone] Website: www.dreamalign.com Services Offered: Monday 10:00am-12:00, 8:00pm-9:00pm, and Friday 10:00am-12:00.  Agency Name: [facility] [place] Address: [address] [place], [place] [address] Phone: [phone] Website: www.alliedchurches.org Service(s) Offered: Serves weekday meals, open from 11:30 am- 1:00 pm., and 6:30-7:30pm, Monday-Wednesday-Friday distributes food 3:30-6pm, Monday-Wednesday-Friday.  Agency Name: [facility] Address:  [address], [place], [place] Phone: [phone] Website:  [email or web address].[email or web address].[email or web address] Services Offered: Distributes food the 4th Saturday of the month, starting at 8:00 am  Agency Name: [facility] Address: [phone] [address] [place], [place] [address] Phone: [phone] Website: [email or web address] Service(s) Offered: Bread of life, weekly food pantry. Open Wednesdays from 10:00am-noon.  Agency Name: [facility] [employer] Address: [address] [place], [place], [place] Phone: [phone] Services Offered: Distributes food 9am-1pm, Monday-Thursday. Call for details.  Agency Name: [facility] Address: [address], [place], [place] [address] Phone: [phone] Website: firstbaptistburlington.com Service(s) Offered: Games developer. Call for assistance.  Agency Name: [facility] Address: [address] [place], [place] [address] Phone: [phone] Service Offered: Emergency Food Pantry. Call for appointment.  Agency Name: [facility] Address: [address] [place], [place] [address] Phone: [phone] Website: [email or web address] Services Offered: Games developer. Call for details  Agency Name: [facility] Address: [address] [place], [place] Phone: [phone] Website: [email or web address] Service(s) Offered: Emergency Food Pantry. Call for details.  Agency Name: Holiday representative Address: [address], [place], [place] [address] Phone: [phone] or [phone] Website: [email or web address] Service(s) Offered: Distribute food 9am-11:30 am, Tuesday-Friday, and 1-3:30pm, Monday-Friday. Food pantry Monday-Friday 1pm-3pm, fresh items, Mon.-Wed.-Fri.  Agency Name: [facility] ([facility]) Address: [address] [place], [place] [address] Phone: [phone] Website: www.safealamance.org Services Offered: Distribute food Tues and Sats from 9:00am-noon. Closed 1st Saturday of each month. Call for  details  Agency Name: [facility] [employer] Address: Reynaldo [facility] [address], [place] [address] Phone: [phone]  Services Offered: Delivers meals every Thursday

## 2023-04-21 ENCOUNTER — Telehealth: Payer: Self-pay

## 2023-04-21 NOTE — Transitions of Care (Post Inpatient/ED Visit) (Signed)
04/21/2023  Name: Molly Howard MRN: [id number] DOB: 12-10-1985  Today's TOC FU Call Status: Today's TOC FU Call Status:: Successful TOC FU Call Completed TOC FU Call Complete Date: 04/21/23 Patient's Name and Date of Birth confirmed.  Transition Care Management Follow-up Telephone Call Date of Discharge: 04/20/23 Discharge Facility: [facility]) Type of Discharge: Inpatient Admission Primary Inpatient Discharge Diagnosis:: Acute on chronic combined systolic and diastolic CHF (congestive heart failure) How have you been since you were released from the hospital?: Better Any questions or concerns?: No  Items Reviewed: Did you receive and understand the discharge instructions provided?: Yes Medications obtained,verified, and reconciled?: Yes (Medications Reviewed) Any new allergies since your discharge?: No Dietary orders reviewed?: Yes Type of Diet Ordered:: Reg, Heart Healthy, Do you have support at home?: Yes People in Home: sibling(s) Name of Support/Comfort Primary Source: Sister Sktrow  Medications Reviewed Today: Medications Reviewed Today     Reviewed by Johnnette Barrios, RN (Registered Nurse) on 04/21/23 at 1020  Med List Status: <None>   Medication Order Taking? Sig Documenting Provider Last Dose Status Informant  Accu-Chek Softclix Lancets lancets [id number] No Use as instructed  Patient not taking: Reported on 04/21/2023   Margarita Mail, DO Not Taking Active            Med Note Sharon Seller, Joel Mericle L   Thu Apr 21, 2023 10:18 AM) Does not have glucometer   atorvastatin (LIPITOR) 20 MG tablet [id number] Yes Take 1 tablet (20 mg total) by mouth daily. Wouk, Wilfred Curtis, MD Taking Active   carvedilol (COREG) 12.5 MG tablet [id number] Yes Take 1 tablet (12.5 mg total) by mouth 2 (two) times daily with a meal. Wouk, Wilfred Curtis, MD Taking Active   Continuous Glucose Sensor (FREESTYLE LIBRE 3 SENSOR) [place] [id number] No Place 1 sensor on the skin  every 14 days. Use to check glucose continuously  Patient not taking: Reported on 04/21/2023   Margarita Mail, DO Not Taking Active            Med Note Sharon Seller, Malick Netz L   Thu Apr 21, 2023 10:19 AM) Does not have this ,needs refill   dapagliflozin propanediol (FARXIGA) 10 MG TABS tablet [id number] Yes Take 1 tablet (10 mg total) by mouth once daily. Wouk, Wilfred Curtis, MD Taking Active   digoxin (LANOXIN) 0.125 MG tablet [id number] Yes Take 0.5 tablets (0.0625 mg total) by mouth daily. Wouk, Wilfred Curtis, MD Taking Active     Discontinued 10/18/20 1034 (Reorder) sacubitril-valsartan (ENTRESTO) 97-103 MG [id number] Yes Take 1 tablet by mouth 2 (two) times daily. Wouk, Wilfred Curtis, MD Taking Active   Semaglutide,0.25 or 0.5MG /DOS, (OZEMPIC, 0.25 OR 0.5 MG/DOSE,) 2 MG/3ML SOPN [id number] No Inject 0.5 mg into the skin once a week.  Patient not taking: Reported on 04/04/2023   Margarita Mail, DO Not Taking Active            Med Note Sharon Seller, Amoura Ransier L   Thu Apr 21, 2023 10:20 AM) Needs new prescription , does not have   spironolactone (ALDACTONE) 25 MG tablet [id number] Yes Take 1 tablet (25 mg total) by mouth daily. Wouk, Wilfred Curtis, MD Taking Active   torsemide [facility]) 20 MG tablet [id number] Yes Take 2 tablets (40 mg total) by mouth daily Wouk, Wilfred Curtis, MD Taking Active   Med List Note Erasmo Leventhal 11/27/20 2355): Patient states she has been out of her medications for "a while now". It is due to financial limitations.  Medication reconciliation/ review completed; confirmed patient is taking all newly prescribed medications as instructed; Aware of changes to previous medications and dosage adjustments. Patient expressed  concerns regarding needing new prescriptions and issues with paying for these medications today  Home Care and Equipment/Supplies: Were Home Health Services Ordered?: No Any new equipment or medical supplies ordered?: No  Functional  Questionnaire: Do you need assistance with bathing/showering or dressing?: No Do you need assistance with meal preparation?: No Do you need assistance with eating?: No Do you have difficulty maintaining continence: No Do you need assistance with getting out of bed/getting out of a chair/moving?: No Do you have difficulty managing or taking your medications?: Yes (needs multiple medications , unable ro obtain due to financial constraints)  Follow up appointments reviewed: PCP Follow-up appointment confirmed?: Yes Date of PCP follow-up appointment?: 04/28/23 Follow-up Provider: Margarita Mail Specialist [facility] Follow-up appointment confirmed?: Yes Date of Specialist follow-up appointment?: 04/25/23 Follow-Up Specialty Provider:: Cardioogy 12/23 Do you need transportation to your follow-up appointment?: Yes Transportation Need Intervention Addressed By:: Other: (will use Insurance supported transport , has voucher for transport) Do you understand care options if your condition(s) worsen?: Yes-patient verbalized understanding  SDOH Interventions Today    Flowsheet Row Most Recent Value  SDOH Interventions   Food Insecurity Interventions AMB Referral, Community Resources Provided  Housing Interventions AMB Referral, Other (Comment)  [Followed by Longitudinal [facility]BCI CM and SW]  Transportation Interventions AMB Referral, Other (Comment)  Utilities Interventions AMB Referral, Other (Comment)  [currently lives in motel]      Patient is at high risk for readmission and/or has history of  high utilization  Discussed [facility]BCI  TOC program and weekly calls to patient to assess condition/status, medication management  and provide support/education as indicated . Patient/ Caregiver voiced understanding and is currently followed by Longitudinal Case Management with Nursing and Social Work support  She notes she resides in a motel with her sister, they are insure of how they will pay for the room. She  also stated they need food resources. They are followed by VBCI C< and she has previously spoken with SW . She has been unable ro obtain several of her medications due to financial limitations. She is followed by VBCI CM  MM and stated she has made them aware of these concerns.   Appts are previously scheduled with Social Work and Nursing - next call 04/28/23   Susa Loffler , BSN, RN Care Management Coordinator [facility]   [facility] [email or web address] Direct Dial: [phone]

## 2023-04-21 NOTE — Patient Instructions (Signed)
   Visit Information  Thank you for taking time to visit with me today. Please don't hesitate to contact me if I can be of assistance to you before our next scheduled telephone appointment. Your next [facility]BCI Case Management call  appointment is  on 12/26 at 3:15pm     Reviewed goals for care Patient/ Caregiver  verbalizes understanding of instructions and care plan provided. Patient / Caregiver was encouraged to make informed decisions about care, actively participate in managing health conditions, and implement lifestyle changes as needed to promote independence and self-management of their medical care     Please call the care guide team at [phone]  if you need to cancel or reschedule your appointment . For scheduled calls -Three attempts will be made to reach you if the scheduled call is missed or  we are unable to reach the you  after 3 attempts the case will be closed and no additional outreach attempts will be made.   If you need to speak to a Nurse you may  call me directly at the number below or if I am unavailable,and  your need is urgent  please call the main VBCI number at [phone] and ask to speak with one of the [facility] ( Transition of Care )  Nurses  .     Additionally, If you experience worsening of your symptoms, develop shortness of breath, If you are experiencing a medical emergency,  develop suicidal or homicidal thoughts you must seek medical attention immediately by calling 911 or report to your local emergency department or urgent care.   If you have a non-emergency medical problem during routine business hours, please contact your provider's office and ask to speak with a nurse.       Please take the time to read instructions/literature along with the possible adverse reactions/side effects for all the Medicines that have been prescribed to you. Only take newly prescribed  Medications after you have completely understood and accept all the possible adverse  reactions/side effects.   Do not take more than prescribed Medications for  Pain, Sleep and Anxiety. Do not drive when taking Pain medications or sleep aid/ insomnia  medications It is not advisable to combine anxiety, sleep and pain medications without talking with your primary care practitioner    If you are experiencing a Mental Health or Behavioral Health Crisis or need someone to talk to Please call the Suicide and Crisis Lifeline: 988 You may also call the [place] National Suicide Prevention Lifeline: 262-608-8305 or TTY: (905) 014-7790 TTY 219-714-9841) to talk to a trained counselor.  You may call the Behavioral Health Crisis Line at 343-004-1402, at any time, 24 hours a day, 7 days a week- however If you are in danger or need immediate medical attention, call 911.   If you would like help to quit smoking, call 1-800-QUIT-NOW ( 684-831-3261) OR Espaol: 1-855-Djelo-Ya (4-332-951-8841) o para ms informacin haga clic aqu or Text READY to 660-630 to register via text.   Susa Loffler , BSN, RN Care Management Coordinator [place]   [facility] [email or web address] Direct Dial: [phone]

## 2023-04-22 ENCOUNTER — Ambulatory Visit: Payer: Medicaid Other | Admitting: Family

## 2023-04-25 ENCOUNTER — Other Ambulatory Visit: Payer: Self-pay

## 2023-04-25 ENCOUNTER — Encounter: Payer: Self-pay | Admitting: Family

## 2023-04-25 ENCOUNTER — Ambulatory Visit (HOSPITAL_BASED_OUTPATIENT_CLINIC_OR_DEPARTMENT_OTHER): Payer: Medicaid Other | Admitting: Family

## 2023-04-25 ENCOUNTER — Other Ambulatory Visit
Admission: RE | Admit: 2023-04-25 | Discharge: 2023-04-25 | Disposition: A | Discharge status: Home / Self Care | Payer: Medicaid Other | Attending: Family | Admitting: Family

## 2023-04-25 VITALS — BP 108/65 | HR 70 | Wt 205.4 lb

## 2023-04-25 DIAGNOSIS — Z7984 Long term (current) use of oral hypoglycemic drugs: Secondary | ICD-10-CM | POA: Insufficient documentation

## 2023-04-25 DIAGNOSIS — I5022 Chronic systolic (congestive) heart failure: Secondary | ICD-10-CM | POA: Diagnosis not present

## 2023-04-25 DIAGNOSIS — E1129 Type 2 diabetes mellitus with other diabetic kidney complication: Secondary | ICD-10-CM

## 2023-04-25 DIAGNOSIS — R5383 Other fatigue: Secondary | ICD-10-CM | POA: Diagnosis present

## 2023-04-25 DIAGNOSIS — I471 Supraventricular tachycardia, unspecified: Secondary | ICD-10-CM | POA: Insufficient documentation

## 2023-04-25 DIAGNOSIS — I10 Essential (primary) hypertension: Secondary | ICD-10-CM

## 2023-04-25 DIAGNOSIS — I130 Hypertensive heart and chronic kidney disease with heart failure and stage 1 through stage 4 chronic kidney disease, or unspecified chronic kidney disease: Secondary | ICD-10-CM | POA: Insufficient documentation

## 2023-04-25 DIAGNOSIS — D631 Anemia in chronic kidney disease: Secondary | ICD-10-CM | POA: Insufficient documentation

## 2023-04-25 DIAGNOSIS — I5082 Biventricular heart failure: Secondary | ICD-10-CM | POA: Insufficient documentation

## 2023-04-25 DIAGNOSIS — E1122 Type 2 diabetes mellitus with diabetic chronic kidney disease: Secondary | ICD-10-CM | POA: Insufficient documentation

## 2023-04-25 DIAGNOSIS — D509 Iron deficiency anemia, unspecified: Secondary | ICD-10-CM | POA: Diagnosis not present

## 2023-04-25 DIAGNOSIS — N189 Chronic kidney disease, unspecified: Secondary | ICD-10-CM | POA: Diagnosis not present

## 2023-04-25 DIAGNOSIS — F1910 Other psychoactive substance abuse, uncomplicated: Secondary | ICD-10-CM

## 2023-04-25 DIAGNOSIS — I428 Other cardiomyopathies: Secondary | ICD-10-CM | POA: Diagnosis not present

## 2023-04-25 LAB — BASIC METABOLIC PANEL
Anion gap: 11 (ref 5–15)
BUN: 26 mg/dL — ABNORMAL HIGH (ref 6–20)
CO2: 26 mmol/L (ref 22–32)
Calcium: 7.8 mg/dL — ABNORMAL LOW (ref 8.9–10.3)
Chloride: 96 mmol/L — ABNORMAL LOW (ref 98–111)
Creatinine, Ser: 1.55 mg/dL — ABNORMAL HIGH (ref 0.44–1.00)
GFR, Estimated: 44 mL/min — ABNORMAL LOW (ref >60)
Glucose, Bld: 283 mg/dL — ABNORMAL HIGH (ref 70–99)
Potassium: 4.6 mmol/L (ref 3.5–5.1)
Sodium: 133 mmol/L — ABNORMAL LOW (ref 135–145)

## 2023-04-25 MED ORDER — OZEMPIC (0.25 OR 0.5 MG/DOSE) 2 MG/3ML ~~LOC~~ SOPN
0.5000 mg | PEN_INJECTOR | SUBCUTANEOUS | 1 refills | Status: DC
  Filled 2023-04-25: qty 3, 28d supply, fill #0
  Filled 2023-06-03 – 2023-06-06 (×2): qty 3, 28d supply, fill #1

## 2023-04-25 NOTE — Progress Notes (Signed)
PCP: Margarita Mail, DO (last seen 10/24) Primary Cardiologist: Debbe Odea, MD/ Eula Listen, PA (last seen 11/24)  Chief Complaint: Fatigue   HPI:  Molly Howard is a 37 y/o female with a history of DM, HTN, previous drug use, NSVT, PSVT, mod/severe TR, CKD, anemia and chronic biventricular heart failure.   Admitted 02/09/22 due to acute on chronic HF. + cocaine. Discharged after 7 days. Admitted 07/24/22 due to decompensated HFrEF. Received IV Lasix. Was on milrinone drip. Digoxin added. Uop about 41 liters with decrease in weight 116 kg >>91 kg. Admitted 11/05/22 due to swelling and fluid retention in arms and legs along with some central chest tightness and shortness of breath and all has been going on for about a week. Needed lasix gtt with milrinone and diuresing well,  total UOP >40 liters. Weight significantly down 130 kg-->97 kg. Weaned off of IV drips. Spironolactone stopped due to hyperkalemia. Urine drug screen + for cocaine. Negative for dvt/stenosis. X-ray nothing acute to explain left arm swelling.    Admitted 04/15/23 due to worsening dyspnea with associated bilateral lower extremity edema. Had orthopnea and PND. Had not been on her diuretic because she had gotten kicked out of the house and was unable to go back for her medications. IV diuresed. Advanced HF team consulted. Venofer given for anemia. ICD deferred due to HF exacerbation.   Echo 10/11/20: EF 30-35% along with moderate TR.  Echo 03/20/21: EF 25-30% along with severely elevated PA pressure of 70.8 mmHg, mild LAE, mild MR and mild/moderate TR Echo 06/30/21: EF  30-35% along with mild MR. Echo 09/18/21: EF  25-30%.   Echo 07/25/22: EF 25-30% with moderately elevated PA pressure of 50.7 mmHg, mild LAE, mild MR, small pericardial effusion and moderate/ severe TR  Cardiac MRI 05/19/21:  1. Mild LV dilatation, mild hypertrophy, and mild systolic dysfunction (EF 42%)  2.  No late gadolinium enhancement to suggest myocardial  scar  3.  Normal RV size and systolic function (EF 50%)  RHC 07/28/22: Severely elevated right and left-sided filling pressures, moderate pulmonary hypertension and moderately reduced cardiac output. RA: 30 mmHg RV: 56/14 mmHg PW: 34 mmHg PA: 54/30 with a mean of 41 mmHg PA sat is 42% Cardiac output: 4.47 with a cardiac index of 2. CPO is 0.89 PAPI: 0.8  Zio patch 12/2022: Patient had a min HR of 62 bpm, max HR of 187 bpm, and avg HR of 76 bpm. Predominant underlying rhythm was Sinus Rhythm. 24 Ventricular Tachycardia runs occurred, the run with the fastest interval lasting 7 beats with a max rate of 187 bpm, the longest lasting 13 beats with an avg rate of 128 bpm. 1 run of Supraventricular Tachycardia occurred lasting 6 beats with a max rate of 135 bpm (avg 116 bpm). Isolated SVEs were rare (<1.0%), SVE Couplets were rare (<1.0%), and SVE Triplets were rare (<1.0%). Isolated VEs were rare (<1.0%, 1371), VE Couplets were rare (<1.0%, 120), and VE Triplets were rare (<1.0%, 26). Ventricular Bigeminy was present.  Conclusion Occasional nonsustained VT. 1 episode of paroxysmal SVT. No sustained arrhythmias. No atrial fibrillation or atrial flutter.  She presents today for a HF follow-up visit with a chief complaint of minimal fatigue with moderate exertion. Chronic in nature although improving. Has associated headache, palpitations, light-headedness along with this. Denies shortness of breath, chest pain, cough, abdominal distention or pedal edema. Reports sleeping well on 2 pillows chronically.   Due to having HF exacerbation with hospital stay, ICD implantation had to  be deferred. She is hoping to have the ICD implanted early May 02, 2024.  Drinks ~ 32 oz soda and 32 oz water daily. No further alcohol. No drug use since 07/24. Currently living at [facility] with her sister.    ROS: All systems negative except as listed in HPI, PMH and Problem List.  SH:  Social History   Socioeconomic History    Marital status: Single    Spouse name: Not on file   Number of children: 0   Years of education: Not on file   Highest education level: 12th grade  Occupational History   Occupation: Diaabled  Tobacco Use   Smoking status: Former    Current packs/day: 0.00    Types: Cigarettes    Quit date: May 02, 2021    Years since quitting: 2.9   Smokeless tobacco: Never  Vaping Use   Vaping status: Never Used  Substance and Sexual Activity   Alcohol use: Not Currently   Drug use: Not Currently    Types: Cocaine    Comment: Admits to using cocaine up and will July 2024.   Sexual activity: Not Currently    Birth control/protection: None  Other Topics Concern   Not on file  Social History Narrative   Lives locally.  Does not routinely exercise.  Has been using cocaine.   Social Drivers of Corporate investment banker Strain: High Risk (04/15/2023)   Overall Financial Resource Strain (CARDIA)    Difficulty of Paying Living Expenses: Very hard  Food Insecurity: Food Insecurity Present (04/21/2023)   Hunger Vital Sign    Worried About Running Out of Food in the Last Year: Often true    Ran Out of Food in the Last Year: Often true  Transportation Needs: Unmet Transportation Needs (04/21/2023)   PRAPARE - Administrator, Civil Service (Medical): Yes    Lack of Transportation (Non-Medical): No  Physical Activity: Not on file  Stress: Not on file  Social Connections: Not on file  Intimate Partner Violence: Not At Risk (04/21/2023)   Humiliation, Afraid, Rape, and Kick questionnaire    Fear of Current or Ex-Partner: No    Emotionally Abused: No    Physically Abused: No    Sexually Abused: No    FH:  Family History  Problem Relation Age of Onset   Heart failure Mother        Onset of heart failure 40s.  Died in 2022-05-02.   Diabetes Mother    Hypertension Father    Diabetes Father     Past Medical History:  Diagnosis Date   Acid reflux    Chronic HFrEF (heart failure  with reduced ejection fraction) (HCC)    a. 10/2019 Echo: EF 35-40%, GrII DD; b. 05/2020 Echo: EF 20-25%, glob HK; c. 10/2020 Echo: EF 30-35%, glob HK. GrII DD, Mildly red RV fxn. Mod TR; d.  05/2021 cMRI: EF 42%, no LGE. Nl RV size/fxn.   CKD (chronic kidney disease), stage II    Diabetes mellitus (HCC)    H/O medication noncompliance    Hypertension    Microcytic anemia    NICM (nonischemic cardiomyopathy) (HCC)    a. 10/2019 Echo: EF 35-40%; b. 10/2019 MV: No ischemia. Small apical defect-->breast attenuation; c. 05/2020 Echo: EF 20-25%; d. 10/2020 Echo: EF 30-35%, glob HK. GrII DD, Mildly red RV fxn. Mod TR; e. 05/2021 cMRI: EF 42%, no LGE. Nl RV size/fxn.   Obesity    Polysubstance abuse (HCC)  Current Outpatient Medications  Medication Sig Dispense Refill   Accu-Chek Softclix Lancets lancets Use as instructed (Patient not taking: Reported on 04/21/2023) 100 each 12   atorvastatin (LIPITOR) 20 MG tablet Take 1 tablet (20 mg total) by mouth daily. 30 tablet 1   carvedilol (COREG) 12.5 MG tablet Take 1 tablet (12.5 mg total) by mouth 2 (two) times daily with a meal. 60 tablet 1   Continuous Glucose Sensor (FREESTYLE LIBRE 3 SENSOR) MISC Place 1 sensor on the skin every 14 days. Use to check glucose continuously (Patient not taking: Reported on 04/21/2023) 2 each 12   dapagliflozin propanediol (FARXIGA) 10 MG TABS tablet Take 1 tablet (10 mg total) by mouth once daily. 30 tablet 1   digoxin (LANOXIN) 0.125 MG tablet Take 0.5 tablets (0.0625 mg total) by mouth daily. 30 tablet 3   sacubitril-valsartan (ENTRESTO) 97-103 MG Take 1 tablet by mouth 2 (two) times daily. 60 tablet 1   Semaglutide,0.25 or 0.5MG /DOS, (OZEMPIC, 0.25 OR 0.5 MG/DOSE,) 2 MG/3ML SOPN Inject 0.5 mg into the skin once a week. (Patient not taking: Reported on 04/04/2023) 3 mL 1   spironolactone (ALDACTONE) 25 MG tablet Take 1 tablet (25 mg total) by mouth daily. 30 tablet 1   torsemide (DEMADEX) 20 MG tablet Take 2 tablets (40 mg  total) by mouth daily 60 tablet 1   No current facility-administered medications for this visit.   Vitals:   04/25/23 1509  BP: 108/65  Pulse: 70  SpO2: 99%  Weight: 205 lb 6.4 oz (93.2 kg)   Wt Readings from Last 3 Encounters:  04/25/23 205 lb 6.4 oz (93.2 kg)  04/20/23 203 lb (92.1 kg)  03/24/23 202 lb 4 oz (91.7 kg)   Lab Results  Component Value Date   CREATININE 1.55 (H) 04/25/2023   CREATININE 0.96 04/20/2023   CREATININE 1.14 (H) 04/19/2023    PHYSICAL EXAM:  General:  Well appearing. No resp difficulty HEENT: normal Neck: supple. JVP flat. No lymphadenopathy or thryomegaly appreciated. Cor: PMI normal. Regular rate & rhythm. No rubs, gallops. II/ VI murmur LSB Lungs: clear Abdomen: soft, nontender, nondistended. No hepatosplenomegaly. No bruits or masses.  Extremities: no cyanosis, clubbing, rash, edema Neuro: alert & orientedx3, cranial nerves grossly intact. Moves all 4 extremities w/o difficulty. Affect pleasant.   ECG: not done   ASSESSMENT & PLAN:  1: NICM with reduced ejection fraction- - suspect due to previous cocaine use and uncontrolled HTN  - NYHA class II - euvolemic - weighing daily; reminded to call for an overnight weight gain of > 2 pounds or a weekly weight gain of > 5 pounds - weight weight up 3 pounds from last visit here 1 month ago - Echo 10/11/20: EF 30-35% along with moderate TR.  - Echo 03/20/21: EF 25-30% along with severely elevated PA pressure of 70.8 mmHg, mild LAE, mild MR and mild/moderate TR - Echo 06/30/21: EF  30-35% along with mild MR. - Echo 09/18/21: EF  25-30%.   - Echo 07/25/22: EF 25-30% with moderately elevated PA pressure of 50.7 mmHg, mild LAE, mild MR, small pericardial effusion and moderate/ severe TR - Cardiac MRI 05/19/21:    1. Mild LV dilatation, mild hypertrophy, and mild systolic   dysfunction (EF 42%)    2. No late gadolinium enhancement to suggest myocardial scar   3. Normal RV size and systolic function (EF  50%) - saw cardiology ([place]) 11/24 - saw EP Lalla Brothers) 11/24 & will hopefully be having ICD implanted early  2025  - continue carvedilol 6.25mg  BID - continue farxiga 10mg  daily - continue digoxin 0.0625mg  daily; dig level 04/20/23 was 0.3 - continue entresto 97/103mg  BID  - continue spironolatone 25mg  daily - continue torsemide 40mg  daily - BMET today - not a candidate for advanced therapies due to repeated + drug screens - BNP 04/14/23 was 1519.4  2: HTN- - BP 108/65 - saw PCP Angelica Chessman) 10/24 - BMP 04/20/23 showed sodium 137, potassium 4.0, creatinine 0.96 & GFR >60 - BMET today  3: DM- - A1c 02/17/23 was 7.7% - ozempic 0.25mg  weekly re- started today  4: Anemia- - Hg 04/18/23 showed hemoglobin 8.6  5: Substance use- - no tobacco - no drug use since 07/24 - denies any recent alcohol use  Return in 3 weeks, sooner if needed.

## 2023-04-25 NOTE — Patient Instructions (Signed)
Labs done today. We will contact you only if your labs are abnormal.  No medication changes were made. Please continue all current medications as prescribed.  Your physician recommends that you schedule a follow-up appointment in: 3 weeks  If you have any questions or concerns before your next appointment please send [place] a message through [place] or call our office at [phone].    TO LEAVE A MESSAGE FOR THE NURSE SELECT OPTION 2, PLEASE LEAVE A MESSAGE INCLUDING: YOUR NAME DATE OF BIRTH CALL BACK NUMBER REASON FOR CALL**this is important as we prioritize the call backs  YOU WILL RECEIVE A CALL BACK THE SAME DAY AS LONG AS YOU CALL BEFORE 4:00 PM   Do the following things EVERYDAY: Weigh yourself in the morning before breakfast. Write it down and keep it in a log. Take your medicines as prescribed Eat low salt foods--Limit salt (sodium) to 2000 mg per day.  Stay as active as you can everyday Limit all fluids for the day to less than 2 liters   At the [facility], you and your health needs are our priority. As part of our continuing mission to provide you with exceptional heart care, we have created designated Provider Care Teams. These Care Teams include your primary Cardiologist (physician) and Advanced Practice Providers (APPs- Physician Assistants and Nurse Practitioners) who all work together to provide you with the care you need, when you need it.   You may see any of the following providers on your designated Care Team at your next follow up: Dr Arvilla Meres Dr Marca Ancona Dr. Marcos Eke, NP Robbie Lis, [place] [facility] [place], [place] Brynda Peon, NP Karle Plumber, PharmD   Please be sure to bring in all your medications bottles to every appointment.    Thank you for choosing [facility]

## 2023-04-26 ENCOUNTER — Telehealth: Payer: Self-pay

## 2023-04-26 DIAGNOSIS — I5022 Chronic systolic (congestive) heart failure: Secondary | ICD-10-CM

## 2023-04-26 NOTE — Telephone Encounter (Signed)
-----   Message from Delma Freeze sent at 04/25/2023  4:19 PM EST ----- Kidney function a little worse since recent admission. Make sure drinking 60-64 ounces of fluid daily. Recheck BMET in 2 weeks.

## 2023-04-28 ENCOUNTER — Ambulatory Visit: Payer: Self-pay | Admitting: *Deleted

## 2023-04-29 ENCOUNTER — Other Ambulatory Visit: Payer: Self-pay | Admitting: *Deleted

## 2023-04-29 NOTE — Patient Outreach (Signed)
Medicaid Managed Care   Nurse Care Manager Note  04/29/2023 Name:  Molly Howard MRN:  [id number] DOB:  1986-05-03  Molly Howard is an 37 y.o. year old female who is a primary patient of Molly Mail, DO.  The [facility] Managed Care Coordination team was consulted for assistance with:    DMII  [name] was given information about Medicaid Managed Care Coordination team services today. Molly Howard Patient agreed to services and verbal consent obtained.  Engaged with patient by telephone for follow up visit in response to provider referral for case management and/or care coordination services.   Patient is participating in a Managed Medicaid Plan:  Yes  Assessments/Interventions:  Review of past medical history, allergies, medications, health status, including review of consultants reports, laboratory and other test data, was performed as part of comprehensive evaluation and provision of chronic care management services.  SDOH (Social Drivers of Health) assessments and interventions performed: SDOH Interventions    Flowsheet Row Telephone from 04/21/2023 in [place] POPULATION HEALTH DEPARTMENT ED to Hosp-Admission (Discharged) from 04/15/2023 in [facility] REGIONAL CARDIAC MED PCU Patient Outreach Telephone from 04/04/2023 in [place] POPULATION HEALTH DEPARTMENT Patient Outreach Telephone from 11/24/2022 in [place] POPULATION HEALTH DEPARTMENT Telephone from 06/30/2020 in [facility] [place] Telephone from 06/16/2020 in [facility]  SDOH Interventions        Food Insecurity Interventions AMB Referral, Community Resources Provided -- Other (Comment)  [BSW referral] Other (Comment)  [working with BSW for food assistance] -- Other (Comment)  [will help with transportation to get to food pantries as that was the main barrier]  Housing Interventions AMB Referral, Other (Comment)  [Followed by [facility] CM and SW] Inpatient TOC  Other (Comment)  [BSW referral] Intervention Not Indicated -- Other (Comment)  [referred to [facility] to apply for low income water assistance program]  Transportation Interventions AMB Referral, Other (Comment) Other (Comment)  [Pt uses Medicaid for transportation] -- Payor Benefit  [Provided with UHC transportation [phone] [employer] [facility] [employer] [facility]  Utilities Interventions AMB Referral, Other (Comment)  [currently lives in motel] -- -- Intervention Not Indicated -- --  Alcohol Usage Interventions -- Intervention Not Indicated (Score <7) -- -- -- --  Financial Strain Interventions -- Inpatient TOC -- -- -- Other (Comment)  [providing [employer] gift cards to help with medication costs]       Care Plan  Allergies  Allergen Reactions   No [employer] Food Allergies Rash and Other (See Comments)    Lemons    Medications Reviewed Today     Reviewed by Heidi Dach, RN (Registered Nurse) on 04/29/23 at 1529  Med List Status: <None>   Medication Order Taking? Sig Documenting Provider Last Dose Status Informant  Accu-Chek Softclix Lancets lancets [id number] No Use as instructed  Patient not taking: Reported on 04/29/2023   Molly Mail, DO Not Taking Active            Med Note Sharon Seller, CRYSTAL L   Thu Apr 21, 2023 10:18 AM) Does not have glucometer   carvedilol (COREG) 12.5 MG tablet [id number] Yes Take 1 tablet (12.5 mg total) by mouth 2 (two) times daily with a meal. Wouk, Wilfred Curtis, MD Taking Active   Continuous Glucose Sensor (FREESTYLE LIBRE 3 SENSOR) [place] [id number] No Place 1 sensor on the skin every 14 days. Use to check glucose continuously  Patient not taking: Reported on 04/29/2023   Molly Mail, DO Not Taking  Active            Med Note (LLOYD, CRYSTAL L   Thu Apr 21, 2023 10:19 AM) Does not have this ,needs refill   dapagliflozin propanediol (FARXIGA) 10 MG TABS tablet [id number] Yes Take 1 tablet (10 mg total) by mouth once daily.  Wouk, Wilfred Curtis, MD Taking Active   digoxin (LANOXIN) 0.125 MG tablet [id number] Yes Take 0.5 tablets (0.0625 mg total) by mouth daily. Wouk, Wilfred Curtis, MD Taking Active     Discontinued 10/18/20 1034 (Reorder) sacubitril-valsartan (ENTRESTO) 97-103 MG [id number] Yes Take 1 tablet by mouth 2 (two) times daily. Wouk, Wilfred Curtis, MD Taking Active   Semaglutide,0.25 or 0.5MG /DOS, (OZEMPIC, 0.25 OR 0.5 MG/DOSE,) 2 MG/3ML SOPN [id number] Yes Inject 0.5 mg into the skin once a week. Delma Freeze, FNP Taking Active   spironolactone (ALDACTONE) 25 MG tablet [id number] Yes Take 1 tablet (25 mg total) by mouth daily. Wouk, Wilfred Curtis, MD Taking Active   torsemide [employer]) 20 MG tablet [id number] Yes Take 2 tablets (40 mg total) by mouth daily Wouk, Wilfred Curtis, MD Taking Active   Med List Note Erasmo Leventhal 11/27/20 0254): Patient states she has been out of her medications for "a while now". It is due to financial limitations.            Patient Active Problem List   Diagnosis Date Noted   Primary hypertension 04/16/2023   Controlled type 2 diabetes mellitus without complication, without long-term current use of insulin (HCC) 04/15/2023   Cardiogenic shock (HCC) 11/14/2022   Bilateral leg edema 11/06/2022   NICM (nonischemic cardiomyopathy) (HCC) 07/31/2022   Hypocalcemia 07/25/2022   Leg edema 07/25/2022   Acute on chronic combined systolic and diastolic CHF (congestive heart failure) (HCC) 07/25/2022   GERD without esophagitis 07/24/2022   Cocaine abuse (HCC) 02/12/2022   Acute pulmonary edema (HCC)    Acute systolic heart failure (HCC) 02/09/2022   Syncope 02/09/2022   Menorrhagia with regular cycle 06/22/2021   Dyslipidemia 11/27/2020   Chronic kidney disease (CKD), stage II (mild) 11/27/2020   Hypomagnesemia 10/17/2020   Dyspnea 05/21/2020   Essential hypertension 05/20/2020   Diabetes mellitus, type II (HCC) 05/20/2020   Chronic anemia 05/20/2020   Reactive  thrombocytosis 05/20/2020   Obesity (BMI 30-39.9) 05/20/2020   Chronic combined systolic and diastolic heart failure (HCC) 11/01/2019   Financial difficulties 11/01/2019   Medication management 11/01/2019   Lung nodule 11/01/2019   Nonischemic cardiomyopathy (HCC) 11/01/2019   Polysubstance abuse (HCC) 11/01/2019   Tobacco use 11/01/2019   Swelling    Symptomatic anemia 10/02/2019    Conditions to be addressed/monitored per PCP order:  DMII  Care Plan : RN Care Manager Plan of Care  Updates made by Heidi Dach, RN since 04/29/2023 12:00 AM     Problem: Health Management needs related to DMII      Long-Range Goal: Development of Plan of Care to address Health Management needs related to DMII   Start Date: 11/24/2022  Expected End Date: 02/22/2023  Note:   Current Barriers:  Chronic Disease Management support and education needs related to DMII-Ms. Mehler is living in [place] with her sister. She has all of her medications and is aware of her upcoming appointments.  RNCM Clinical Goal(s):  Patient will verbalize understanding of plan for management of DMII as evidenced by patient reports take all medications exactly as prescribed and will call provider for medication related questions as evidenced by patient reports  attend all scheduled medical appointments: 1/6 with Pharmacist, 05/16/23 with HF Clinic, 05/20/23 with PCP, 05/23/23 with BSW and 05/31/23 with Cardiology as evidenced by provider documentation in EMR        continue to work with RN Care Manager and/or Social Worker to address care management and care coordination needs related to DMII as evidenced by adherence to CM Team Scheduled appointments     work with Child psychotherapist to address Financial constraints related to no income and Limited access to food related to the management of DMII as evidenced by review of EMR and patient or Child psychotherapist report     through collaboration with Medical illustrator, provider, and care team.    Interventions: Inter-disciplinary care team collaboration (see longitudinal plan of care) Evaluation of current treatment plan related to  self management and patient's adherence to plan as established by provider   Diabetes:  (Status: Goal on Track (progressing): YES.) Long Term Goal   Lab Results  Component Value Date   HGBA1C 7.7 (A) 02/17/2023   @ Assessed patient's understanding of A1c goal: <7% Provided education to patient about basic DM disease process; Reviewed medications with patient and discussed importance of medication adherence;        Reviewed prescribed diet with patient MyPlate method, decrease carbohydrates, sweets and sweet drinks; Discussed plans with patient for ongoing care management follow up and provided patient with direct contact information for care management team;      Reviewed scheduled/upcoming provider appointments including:  05/09/23 with Pharmacist, 05/16/23 with HF Clinic, 05/20/23 with PCP, 05/23/23 with BSW and 05/31/23 with Cardiology;         Review of patient status, including review of consultants reports, relevant laboratory and other test results, and medications completed;       Assessed social determinant of health barriers;        Advised patient to contact [facility] member services [phone] for member benefits(fresh fruit and vegetables)-revisited Discussed disability application-patient now working with a lawyer Advised patient to journal BS readings and take to next PCP appointment Advised patient to drink 60-64 ounces of water a day, encouraged to cut down on soda intake   Patient Goals/Self-Care Activities: Take medications as prescribed   Attend all scheduled provider appointments Call provider office for new concerns or questions  Work with the social worker to address care coordination needs and will continue to work with the clinical team to address health care and disease management related needs schedule appointment with eye  doctor drink 6 to 8 glasses of water each day fill half of plate with vegetables limit fast food meals to no more than 1 per week       Follow Up:  Patient agrees to Care Plan and Follow-up.  Plan: The Managed Medicaid care management team will reach out to the patient again over the next 30 days.  Date/time of next scheduled RN care management/care coordination outreach:  06/03/23 at 3:15pm  Estanislado Emms RN, BSN [place]  [facility] RN Care Coordinator [phone]

## 2023-04-29 NOTE — Patient Instructions (Signed)
Visit Information  [name] was given information about Medicaid Managed Care team care coordination services as a part of their [facility] Community Plan Medicaid benefit. Molly Howard verbally consented to engagement with the [facility] Managed Care team.   If you are experiencing a medical emergency, please call 911 or report to your local emergency department or urgent care.   If you have a non-emergency medical problem during routine business hours, please contact your provider's office and ask to speak with a nurse.   For questions related to your [facility], please call: [phone] or visit the homepage here: [email or web address]  If you would like to schedule transportation through your [facility], please call the following number at least 2 days in advance of your appointment: [phone]   Rides for urgent appointments can also be made after hours by calling Member Services.  Call the Behavioral Health Crisis Line at [phone], at any time, 24 hours a day, 7 days a week. If you are in danger or need immediate medical attention call 911.  If you would like help to quit smoking, call 1-800-QUIT-NOW (838-704-2026) OR Espaol: 1-855-Djelo-Ya (3-016-010-9323) o para ms informacin haga clic aqu or Text READY to 557-322 to register via text  Molly Howard,   Please see education materials related to diabetes provided by MyChart link.  Patient verbalizes understanding of instructions and care plan provided today and agrees to view in MyChart. Active MyChart status and patient understanding of how to access instructions and care plan via MyChart confirmed with patient.     Telephone follow up appointment with Managed Medicaid care management team member scheduled for:06/03/23 at 3:15pm  Molly Emms RN, BSN [place]  [facility] RN  Care Coordinator [phone]   Following is a copy of your plan of care:  Care Plan : RN Care Manager Plan of Care  Updates made by Molly Dach, RN since 04/29/2023 12:00 AM     Problem: Health Management needs related to DMII      Long-Range Goal: Development of Plan of Care to address Health Management needs related to DMII   Start Date: 11/24/2022  Expected End Date: 02/22/2023  Note:   Current Barriers:  Chronic Disease Management support and education needs related to DMII-[name] is living in [place] with her sister. She has all of her medications and is aware of her upcoming appointments.  RNCM Clinical Goal(s):  Patient will verbalize understanding of plan for management of DMII as evidenced by patient reports take all medications exactly as prescribed and will call provider for medication related questions as evidenced by patient reports    attend all scheduled medical appointments: 1/6 with Pharmacist, 05/16/23 with HF Clinic, 05/20/23 with PCP, 05/23/23 with BSW and 05/31/23 with Cardiology as evidenced by provider documentation in EMR        continue to work with RN Care Manager and/or Social Worker to address care management and care coordination needs related to DMII as evidenced by adherence to CM Team Scheduled appointments     work with Child psychotherapist to address Financial constraints related to no income and Limited access to food related to the management of DMII as evidenced by review of EMR and patient or Child psychotherapist report     through collaboration with Medical illustrator, provider, and care team.   Interventions: Inter-disciplinary care team collaboration (see longitudinal plan of care) Evaluation of current treatment plan related  to  self management and patient's adherence to plan as established by provider   Diabetes:  (Status: Goal on Track (progressing): YES.) Long Term Goal   Lab Results  Component Value Date   HGBA1C 7.7 (A) 02/17/2023   @ Assessed  patient's understanding of A1c goal: <7% Provided education to patient about basic DM disease process; Reviewed medications with patient and discussed importance of medication adherence;        Reviewed prescribed diet with patient MyPlate method, decrease carbohydrates, sweets and sweet drinks; Discussed plans with patient for ongoing care management follow up and provided patient with direct contact information for care management team;      Reviewed scheduled/upcoming provider appointments including:  05/09/23 with Pharmacist, 05/16/23 with HF Clinic, 05/20/23 with PCP, 05/23/23 with BSW and 05/31/23 with Cardiology;         Review of patient status, including review of consultants reports, relevant laboratory and other test results, and medications completed;       Assessed social determinant of health barriers;        Advised patient to contact [facility] member services [phone] for member benefits(fresh fruit and vegetables)-revisited Discussed disability application-patient now working with a lawyer Advised patient to journal BS readings and take to next PCP appointment Advised patient to drink 60-64 ounces of water a day, encouraged to cut down on soda intake   Patient Goals/Self-Care Activities: Take medications as prescribed   Attend all scheduled provider appointments Call provider office for new concerns or questions  Work with the social worker to address care coordination needs and will continue to work with the clinical team to address health care and disease management related needs schedule appointment with eye doctor drink 6 to 8 glasses of water each day fill half of plate with vegetables limit fast food meals to no more than 1 per week

## 2023-05-09 ENCOUNTER — Other Ambulatory Visit: Payer: Self-pay | Admitting: Pharmacist

## 2023-05-09 ENCOUNTER — Telehealth: Payer: Self-pay | Admitting: Pharmacist

## 2023-05-09 NOTE — Progress Notes (Signed)
   Outreach Note  05/09/2023 Name: Molly Howard MRN: [id number] DOB: 10-14-1985  Referred by: [name], DO    Was unable to reach patient via telephone today and unable to leave a message as no voicemail picks up   Follow Up Plan: Will attempt to reach patient by telephone again within the next 30 days  Fend Sia, PharmD, [facility] [phone]

## 2023-05-12 NOTE — Progress Notes (Deleted)
 PCP: [name], DO (last seen 10/24) Primary Cardiologist: Darliss Rogue, MD/ Abigail Motto, PA (last seen 11/24)  Chief Complaint:    HPI:  Ms Artz is a 38 y/o female with a history of DM, HTN, previous drug use, NSVT, PSVT, mod/severe TR, CKD, anemia and chronic biventricular heart failure.   Admitted 02/09/22 due to acute on chronic HF. + cocaine. Discharged after 7 days. Admitted 07/24/22 due to decompensated HFrEF. Received IV Lasix . Was on milrinone  drip. Digoxin  added. Uop about 41 liters with decrease in weight 116 kg >>91 kg. Admitted 11/05/22 due to swelling and fluid retention in arms and legs along with some central chest tightness and shortness of breath and all has been going on for about a week. Needed lasix  gtt with milrinone  and diuresing well,  total UOP >40 liters. Weight significantly down 130 kg-->97 kg. Weaned off of IV drips. Spironolactone  stopped due to hyperkalemia. Urine drug screen + for cocaine. Negative for dvt/stenosis. X-ray nothing acute to explain left arm swelling.    Admitted 04/15/23 due to worsening dyspnea with associated bilateral lower extremity edema. Had orthopnea and PND. Had not been on her diuretic because she had gotten kicked out of the house and was unable to go back for her medications. IV diuresed. Advanced HF team consulted. Venofer  given for anemia. ICD deferred due to HF exacerbation.   Echo 10/11/20: EF 30-35% along with moderate TR.  Echo 03/20/21: EF 25-30% along with severely elevated PA pressure of 70.8 mmHg, mild LAE, mild MR and mild/moderate TR Echo 06/30/21: EF  30-35% along with mild MR. Echo 09/18/21: EF  25-30%.   Echo 07/25/22: EF 25-30% with moderately elevated PA pressure of 50.7 mmHg, mild LAE, mild MR, small pericardial effusion and moderate/ severe TR  Cardiac MRI 05/19/21:  1. Mild LV dilatation, mild hypertrophy, and mild systolic dysfunction (EF 42%)  2.  No late gadolinium enhancement to suggest myocardial scar   3.  Normal RV size and systolic function (EF 50%)  RHC 07/28/22: Severely elevated right and left-sided filling pressures, moderate pulmonary hypertension and moderately reduced cardiac output. RA: 30 mmHg RV: 56/14 mmHg PW: 34 mmHg PA: 54/30 with a mean of 41 mmHg PA sat is 42% Cardiac output: 4.47 with a cardiac index of 2. CPO is 0.89 PAPI: 0.8  Zio patch 12/2022: Patient had a min HR of 62 bpm, max HR of 187 bpm, and avg HR of 76 bpm. Predominant underlying rhythm was Sinus Rhythm. 24 Ventricular Tachycardia runs occurred, the run with the fastest interval lasting 7 beats with a max rate of 187 bpm, the longest lasting 13 beats with an avg rate of 128 bpm. 1 run of Supraventricular Tachycardia occurred lasting 6 beats with a max rate of 135 bpm (avg 116 bpm). Isolated SVEs were rare (<1.0%), SVE Couplets were rare (<1.0%), and SVE Triplets were rare (<1.0%). Isolated VEs were rare (<1.0%, 1371), VE Couplets were rare (<1.0%, 120), and VE Triplets were rare (<1.0%, 26). Ventricular Bigeminy was present.  Conclusion Occasional nonsustained VT. 1 episode of paroxysmal SVT. No sustained arrhythmias. No atrial fibrillation or atrial flutter.  She presents today for a HF follow-up visit with a chief complaint of    Due to having HF exacerbation with hospital stay, ICD implantation had to be deferred. She is hoping to have the ICD implanted early 2025.  Drinks ~ 32 oz soda and 32 oz water daily. No further alcohol. No drug use since 07/24. Currently living at [employer]  with her sister.    ROS: All systems negative except as listed in HPI, PMH and Problem List.  SH:  Social History   Socioeconomic History   Marital status: Single    Spouse name: Not on file   Number of children: 0   Years of education: Not on file   Highest education level: 12th grade  Occupational History   Occupation: Diaabled  Tobacco Use   Smoking status: Former    Current packs/day: 0.00    Types: Cigarettes     Quit date: Apr 16, 2021    Years since quitting: 3.0   Smokeless tobacco: Never  Vaping Use   Vaping status: Never Used  Substance and Sexual Activity   Alcohol use: Not Currently   Drug use: Not Currently    Types: Cocaine    Comment: Admits to using cocaine up and will July 2024.   Sexual activity: Not Currently    Birth control/protection: None  Other Topics Concern   Not on file  Social History Narrative   Lives locally.  Does not routinely exercise.  Has been using cocaine.   Social Drivers of Corporate Investment Banker Strain: High Risk (04/15/2023)   Overall Financial Resource Strain (CARDIA)    Difficulty of Paying Living Expenses: Very hard  Food Insecurity: Food Insecurity Present (04/21/2023)   Hunger Vital Sign    Worried About Running Out of Food in the Last Year: Often true    Ran Out of Food in the Last Year: Often true  Transportation Needs: Unmet Transportation Needs (04/21/2023)   PRAPARE - Administrator, Civil Service (Medical): Yes    Lack of Transportation (Non-Medical): No  Physical Activity: Not on file  Stress: Not on file  Social Connections: Not on file  Intimate Partner Violence: Not At Risk (04/21/2023)   Humiliation, Afraid, Rape, and Kick questionnaire    Fear of Current or Ex-Partner: No    Emotionally Abused: No    Physically Abused: No    Sexually Abused: No    FH:  Family History  Problem Relation Age of Onset   Heart failure Mother        Onset of heart failure 40s.  Died in 04-16-2022.   Diabetes Mother    Hypertension Father    Diabetes Father     Past Medical History:  Diagnosis Date   Acid reflux    Chronic HFrEF (heart failure with reduced ejection fraction) (HCC)    a. 10/2019 Echo: EF 35-40%, GrII DD; b. 05/2020 Echo: EF 20-25%, glob HK; c. 10/2020 Echo: EF 30-35%, glob HK. GrII DD, Mildly red RV fxn. Mod TR; d.  05/2021 cMRI: EF 42%, no LGE. Nl RV size/fxn.   CKD (chronic kidney disease), stage II    Diabetes  mellitus (HCC)    H/O medication noncompliance    Hypertension    Microcytic anemia    NICM (nonischemic cardiomyopathy) (HCC)    a. 10/2019 Echo: EF 35-40%; b. 10/2019 MV: No ischemia. Small apical defect-->breast attenuation; c. 05/2020 Echo: EF 20-25%; d. 10/2020 Echo: EF 30-35%, glob HK. GrII DD, Mildly red RV fxn. Mod TR; e. 05/2021 cMRI: EF 42%, no LGE. Nl RV size/fxn.   Obesity    Polysubstance abuse (HCC)     Current Outpatient Medications  Medication Sig Dispense Refill   Accu-Chek Softclix Lancets lancets Use as instructed (Patient not taking: Reported on 04/29/2023) 100 each 12   carvedilol  (COREG ) 12.5 MG tablet Take 1 tablet (  12.5 mg total) by mouth 2 (two) times daily with a meal. 60 tablet 1   Continuous Glucose Sensor (FREESTYLE LIBRE 3 SENSOR) MISC Place 1 sensor on the skin every 14 days. Use to check glucose continuously (Patient not taking: Reported on 04/29/2023) 2 each 12   dapagliflozin  propanediol (FARXIGA ) 10 MG TABS tablet Take 1 tablet (10 mg total) by mouth once daily. 30 tablet 1   digoxin  (LANOXIN ) 0.125 MG tablet Take 0.5 tablets (0.0625 mg total) by mouth daily. 30 tablet 3   sacubitril -valsartan  (ENTRESTO ) 97-103 MG Take 1 tablet by mouth 2 (two) times daily. 60 tablet 1   Semaglutide ,0.25 or 0.5MG /DOS, (OZEMPIC , 0.25 OR 0.5 MG/DOSE,) 2 MG/3ML SOPN Inject 0.5 mg into the skin once a week. 3 mL 1   spironolactone  (ALDACTONE ) 25 MG tablet Take 1 tablet (25 mg total) by mouth daily. 30 tablet 1   torsemide  (DEMADEX ) 20 MG tablet Take 2 tablets (40 mg total) by mouth daily 60 tablet 1   No current facility-administered medications for this visit.      PHYSICAL EXAM:  General:  Well appearing. No resp difficulty HEENT: normal Neck: supple. JVP flat. No lymphadenopathy or thryomegaly appreciated. Cor: PMI normal. Regular rate & rhythm. No rubs, gallops. II/ VI murmur LSB Lungs: clear Abdomen: soft, nontender, nondistended. No hepatosplenomegaly. No bruits or  masses.  Extremities: no cyanosis, clubbing, rash, edema Neuro: alert & orientedx3, cranial nerves grossly intact. Moves all 4 extremities w/o difficulty. Affect pleasant.   ECG: not done   ASSESSMENT & PLAN:  1: NICM with reduced ejection fraction- - suspect due to previous cocaine use and uncontrolled HTN  - NYHA class II - euvolemic - weighing daily; reminded to call for an overnight weight gain of > 2 pounds or a weekly weight gain of > 5 pounds - weight 205.4 pounds from last visit here 3 weeks ago - Echo 10/11/20: EF 30-35% along with moderate TR.  - Echo 03/20/21: EF 25-30% along with severely elevated PA pressure of 70.8 mmHg, mild LAE, mild MR and mild/moderate TR - Echo 06/30/21: EF  30-35% along with mild MR. - Echo 09/18/21: EF  25-30%.   - Echo 07/25/22: EF 25-30% with moderately elevated PA pressure of 50.7 mmHg, mild LAE, mild MR, small pericardial effusion and moderate/ severe TR - Cardiac MRI 05/19/21:    1. Mild LV dilatation, mild hypertrophy, and mild systolic   dysfunction (EF 42%)    2. No late gadolinium enhancement to suggest myocardial scar   3. Normal RV size and systolic function (EF 50%) - saw cardiology (Dunn) 11/24 - saw EP Marny) 11/24 & will hopefully be having ICD implanted early 2025  - continue carvedilol  6.25mg  BID - continue farxiga  10mg  daily - continue digoxin  0.0625mg  daily; dig level 04/20/23 was 0.3 - continue entresto  97/103mg  BID  - continue spironolatone 25mg  daily - continue torsemide  40mg  daily - not a candidate for advanced therapies due to repeated + drug screens - BNP 04/14/23 was 1519.4  2: HTN- - BP  - saw PCP Arla) 10/24 - BMP 04/25/23 showed sodium 133, potassium 4.6, creatinine 155 & GFR 44   3: DM- - A1c 02/17/23 was 7.7% - continue ozempic  0.25mg  weekly   4: Anemia- - Hg 04/18/23 showed hemoglobin 8.6  5: Substance use- - no tobacco - no drug use since 07/24 - denies any recent alcohol use

## 2023-05-16 ENCOUNTER — Encounter: Payer: Medicaid Other | Admitting: Family

## 2023-05-16 ENCOUNTER — Other Ambulatory Visit
Admission: RE | Admit: 2023-05-16 | Discharge: 2023-05-16 | Disposition: A | Discharge status: Home / Self Care | Payer: Medicaid Other | Source: Ambulatory Visit | Attending: Family | Admitting: Family

## 2023-05-16 ENCOUNTER — Encounter: Payer: Self-pay | Admitting: Family

## 2023-05-16 ENCOUNTER — Ambulatory Visit: Payer: Medicaid Other | Admitting: Family

## 2023-05-16 VITALS — BP 157/89 | HR 83 | Wt 215.0 lb

## 2023-05-16 DIAGNOSIS — D509 Iron deficiency anemia, unspecified: Secondary | ICD-10-CM

## 2023-05-16 DIAGNOSIS — I110 Hypertensive heart disease with heart failure: Secondary | ICD-10-CM | POA: Diagnosis not present

## 2023-05-16 DIAGNOSIS — E1129 Type 2 diabetes mellitus with other diabetic kidney complication: Secondary | ICD-10-CM

## 2023-05-16 DIAGNOSIS — I5022 Chronic systolic (congestive) heart failure: Secondary | ICD-10-CM | POA: Insufficient documentation

## 2023-05-16 DIAGNOSIS — F1910 Other psychoactive substance abuse, uncomplicated: Secondary | ICD-10-CM

## 2023-05-16 DIAGNOSIS — I10 Essential (primary) hypertension: Secondary | ICD-10-CM

## 2023-05-16 LAB — BASIC METABOLIC PANEL
Anion gap: 11 (ref 5–15)
BUN: 22 mg/dL — ABNORMAL HIGH (ref 6–20)
CO2: 25 mmol/L (ref 22–32)
Calcium: 7.9 mg/dL — ABNORMAL LOW (ref 8.9–10.3)
Chloride: 104 mmol/L (ref 98–111)
Creatinine, Ser: 1.1 mg/dL — ABNORMAL HIGH (ref 0.44–1.00)
GFR, Estimated: >60 mL/min (ref >60)
Glucose, Bld: 125 mg/dL — ABNORMAL HIGH (ref 70–99)
Potassium: 3.9 mmol/L (ref 3.5–5.1)
Sodium: 140 mmol/L (ref 135–145)

## 2023-05-16 NOTE — Progress Notes (Signed)
 PCP: [name], DO (last seen 10/24) Primary Cardiologist: Darliss Rogue, MD/ Abigail Motto, PA (last seen 11/24)  Chief Complaint: shortness of breath   HPI:  Molly Howard is a 38 y/o female with a history of DM, HTN, previous drug use, NSVT, PSVT, mod/severe TR, CKD, anemia and chronic biventricular heart failure.   Admitted 02/09/22 due to acute on chronic HF. + cocaine. Discharged after 7 days. Admitted 07/24/22 due to decompensated HFrEF. Received IV Lasix . Was on milrinone  drip. Digoxin  added. Uop about 41 liters with decrease in weight 116 kg >>91 kg. Admitted 11/05/22 due to swelling and fluid retention in arms and legs along with some central chest tightness and shortness of breath and all has been going on for about a week. Needed lasix  gtt with milrinone  and diuresing well,  total UOP >40 liters. Weight significantly down 130 kg-->97 kg. Weaned off of IV drips. Spironolactone  stopped due to hyperkalemia. Urine drug screen + for cocaine. Negative for dvt/stenosis. X-ray nothing acute to explain left arm swelling.    Admitted 04/15/23 due to worsening dyspnea with associated bilateral lower extremity edema. Had orthopnea and PND. Had not been on her diuretic because she had gotten kicked out of the house and was unable to go back for her medications. IV diuresed. Advanced HF team consulted. Venofer  given for anemia. ICD deferred due to HF exacerbation.   Echo 10/11/20: EF 30-35% along with moderate TR.  Echo 03/20/21: EF 25-30% along with severely elevated PA pressure of 70.8 mmHg, mild LAE, mild MR and mild/moderate TR Echo 06/30/21: EF  30-35% along with mild MR. Echo 09/18/21: EF  25-30%.   Echo 07/25/22: EF 25-30% with moderately elevated PA pressure of 50.7 mmHg, mild LAE, mild MR, small pericardial effusion and moderate/ severe TR  Cardiac MRI 05/19/21:  1. Mild LV dilatation, mild hypertrophy, and mild systolic dysfunction (EF 42%)  2.  No late gadolinium enhancement to suggest  myocardial scar  3.  Normal RV size and systolic function (EF 50%)  RHC 07/28/22: Severely elevated right and left-sided filling pressures, moderate pulmonary hypertension and moderately reduced cardiac output. RA: 30 mmHg RV: 56/14 mmHg PW: 34 mmHg PA: 54/30 with a mean of 41 mmHg PA sat is 42% Cardiac output: 4.47 with a cardiac index of 2. CPO is 0.89 PAPI: 0.8  Zio patch 12/2022: Patient had a min HR of 62 bpm, max HR of 187 bpm, and avg HR of 76 bpm. Predominant underlying rhythm was Sinus Rhythm. 24 Ventricular Tachycardia runs occurred, the run with the fastest interval lasting 7 beats with a max rate of 187 bpm, the longest lasting 13 beats with an avg rate of 128 bpm. 1 run of Supraventricular Tachycardia occurred lasting 6 beats with a max rate of 135 bpm (avg 116 bpm). Isolated SVEs were rare (<1.0%), SVE Couplets were rare (<1.0%), and SVE Triplets were rare (<1.0%). Isolated VEs were rare (<1.0%, 1371), VE Couplets were rare (<1.0%, 120), and VE Triplets were rare (<1.0%, 26). Ventricular Bigeminy was present.  Conclusion Occasional nonsustained VT. 1 episode of paroxysmal SVT. No sustained arrhythmias. No atrial fibrillation or atrial flutter.  She presents today for a HF follow-up visit with a chief complaint of minimal shortness of breath with moderate exertion. Chronic in nature. Has associated fatigue, difficulty sleeping, intermittent chest pain, palpitations, dizziness and edema along with this. Denies cough or abdominal distention. She hasn't taken any of her medications yet today but plans to do so upon her return home.  She is still living at [facility] [address] with her sister. Has been eating saltine crackers and more convenient foods because of living in the motel. Has her scales but says they are packed and she has to find them.  Due to having HF exacerbation with hospital stay 12/24, ICD implantation had to be deferred. She is hoping to have the ICD implanted early  05-07-2024.  Drinks ~ 32 oz soda and 32 oz water daily. No further alcohol. No drug use since 07/24.  ROS: All systems negative except as listed in HPI, PMH and Problem List.  SH:  Social History   Socioeconomic History   Marital status: Single    Spouse name: Not on file   Number of children: 0   Years of education: Not on file   Highest education level: 12th grade  Occupational History   Occupation: Diaabled  Tobacco Use   Smoking status: Former    Current packs/day: 0.00    Types: Cigarettes    Quit date: 05-07-2021    Years since quitting: 3.0   Smokeless tobacco: Never  Vaping Use   Vaping status: Never Used  Substance and Sexual Activity   Alcohol use: Not Currently   Drug use: Not Currently    Types: Cocaine    Comment: Admits to using cocaine up and will July 2024.   Sexual activity: Not Currently    Birth control/protection: None  Other Topics Concern   Not on file  Social History Narrative   Lives locally.  Does not routinely exercise.  Has been using cocaine.   Social Drivers of Corporate Investment Banker Strain: High Risk (04/15/2023)   Overall Financial Resource Strain (CARDIA)    Difficulty of Paying Living Expenses: Very hard  Food Insecurity: Food Insecurity Present (04/21/2023)   Hunger Vital Sign    Worried About Running Out of Food in the Last Year: Often true    Ran Out of Food in the Last Year: Often true  Transportation Needs: Unmet Transportation Needs (04/21/2023)   PRAPARE - Administrator, Civil Service (Medical): Yes    Lack of Transportation (Non-Medical): No  Physical Activity: Not on file  Stress: Not on file  Social Connections: Not on file  Intimate Partner Violence: Not At Risk (04/21/2023)   Humiliation, Afraid, Rape, and Kick questionnaire    Fear of Current or Ex-Partner: No    Emotionally Abused: No    Physically Abused: No    Sexually Abused: No    FH:  Family History  Problem Relation Age of Onset   Heart failure  Mother        Onset of heart failure 40s.  Died in 05-07-2022.   Diabetes Mother    Hypertension Father    Diabetes Father     Past Medical History:  Diagnosis Date   Acid reflux    Chronic HFrEF (heart failure with reduced ejection fraction) (HCC)    a. 10/2019 Echo: EF 35-40%, GrII DD; b. 05/2020 Echo: EF 20-25%, glob HK; c. 10/2020 Echo: EF 30-35%, glob HK. GrII DD, Mildly red RV fxn. Mod TR; d.  05/2021 cMRI: EF 42%, no LGE. Nl RV size/fxn.   CKD (chronic kidney disease), stage II    Diabetes mellitus (HCC)    H/O medication noncompliance    Hypertension    Microcytic anemia    NICM (nonischemic cardiomyopathy) (HCC)    a. 10/2019 Echo: EF 35-40%; b. 10/2019 MV: No ischemia. Small apical defect-->breast attenuation;  c. 05/2020 Echo: EF 20-25%; d. 10/2020 Echo: EF 30-35%, glob HK. GrII DD, Mildly red RV fxn. Mod TR; e. 05/2021 cMRI: EF 42%, no LGE. Nl RV size/fxn.   Obesity    Polysubstance abuse (HCC)     Current Outpatient Medications  Medication Sig Dispense Refill   Accu-Chek Softclix Lancets lancets Use as instructed (Patient not taking: Reported on 04/29/2023) 100 each 12   carvedilol  (COREG ) 12.5 MG tablet Take 1 tablet (12.5 mg total) by mouth 2 (two) times daily with a meal. 60 tablet 1   Continuous Glucose Sensor (FREESTYLE LIBRE 3 SENSOR) MISC Place 1 sensor on the skin every 14 days. Use to check glucose continuously (Patient not taking: Reported on 04/29/2023) 2 each 12   dapagliflozin  propanediol (FARXIGA ) 10 MG TABS tablet Take 1 tablet (10 mg total) by mouth once daily. 30 tablet 1   digoxin  (LANOXIN ) 0.125 MG tablet Take 0.5 tablets (0.0625 mg total) by mouth daily. 30 tablet 3   sacubitril -valsartan  (ENTRESTO ) 97-103 MG Take 1 tablet by mouth 2 (two) times daily. 60 tablet 1   Semaglutide ,0.25 or 0.5MG /DOS, (OZEMPIC , 0.25 OR 0.5 MG/DOSE,) 2 MG/3ML SOPN Inject 0.5 mg into the skin once a week. 3 mL 1   spironolactone  (ALDACTONE ) 25 MG tablet Take 1 tablet (25 mg total)  by mouth daily. 30 tablet 1   torsemide  (DEMADEX ) 20 MG tablet Take 2 tablets (40 mg total) by mouth daily 60 tablet 1   No current facility-administered medications for this visit.   Vitals:   05/16/23 1115  BP: (!) 157/89  Pulse: 83  SpO2: 99%  Weight: 215 lb (97.5 kg)   Wt Readings from Last 3 Encounters:  05/16/23 215 lb (97.5 kg)  04/25/23 205 lb 6.4 oz (93.2 kg)  04/20/23 203 lb (92.1 kg)   Lab Results  Component Value Date   CREATININE 1.55 (H) 04/25/2023   CREATININE 0.96 04/20/2023   CREATININE 1.14 (H) 04/19/2023    PHYSICAL EXAM:  General:  Well appearing. No resp difficulty HEENT: normal Neck: supple. JVP flat. No lymphadenopathy or thryomegaly appreciated. Cor: PMI normal. Regular rate & rhythm. No rubs, gallops. II/ VI murmur LSB Lungs: clear Abdomen: soft, nontender, nondistended. No hepatosplenomegaly. No bruits or masses.  Extremities: no cyanosis, clubbing, rash, trace pitting edema left lower leg Neuro: alert & oriented x3, cranial nerves grossly intact. Moves all 4 extremities w/o difficulty. Affect pleasant.   ECG: NSR with HR 76 (personally reviewed)  ReDs: 41%   ASSESSMENT & PLAN:  1: NICM with reduced ejection fraction- - suspect due to previous cocaine use and uncontrolled HTN  - NYHA class II - minimally fluid overloaded with increased weight, elevated ReDs and symptoms - not weighing daily as she has to find which box her scales are in - weight up 10 pounds from last visit here 3 weeks ago - ReDs 41% - Echo 06/30/21: EF  30-35% along with mild MR. - Echo 09/18/21: EF  25-30%.   - Echo 07/25/22: EF 25-30% with moderately elevated PA pressure of 50.7 mmHg, mild LAE, mild MR, small pericardial effusion and moderate/ severe TR - Cardiac MRI 05/19/21:    1. Mild LV dilatation, mild hypertrophy, and mild systolic   dysfunction (EF 42%)    2. No late gadolinium enhancement to suggest myocardial scar   3. Normal RV size and systolic function (EF  50%) - saw cardiology (Dunn) 11/24 - saw EP Marny) 11/24 & will hopefully be having ICD implanted early 2025  -  EKG today - continue carvedilol  6.25mg  BID - continue farxiga  10mg  daily - continue digoxin  0.0625mg  daily; dig level 04/20/23 was 0.3 - continue entresto  97/103mg  BID  - continue spironolatone 25mg  daily - increase torsemide  to 80mg  daily for 2 days and then decrease it back down to 40mg  daily - BMET/ proBNP today - with living in a motel, eating more convenient foods - not a candidate for advanced therapies due to repeated + drug screens - BNP 04/14/23 was 1519.4  2: HTN- - BP 161/89 & repeat 157/89 but she hasn't taken any of her medications yet today; will do so upon her return to the motel - saw PCP Arla) 10/24; returns later this week - BMP 04/25/23 showed sodium 133, potassium 4.6, creatinine 155 & GFR 44 - BMET today  3: DM- - A1c 02/17/23 was 7.7% - continue ozempic  0.25mg  weekly   4: Anemia- - Hg 04/18/23 showed hemoglobin 8.6  5: Substance use- - no tobacco - no drug use since 07/24 - denies any recent alcohol use - encouraged her to continue abstaining  Return in 1 week, sooner if needed.

## 2023-05-16 NOTE — Progress Notes (Signed)
 REDS VEST READING= 41% CHEST RULER=37 HEIGHT MARKER=D

## 2023-05-16 NOTE — Patient Instructions (Addendum)
 Double your current Torsemide  prescription for the next two days.  Go over to the [facility]. Go pass the gift shop and have your blood work completed. (BMP, Pro-BNP)  We will only call you if the results are abnormal or if the provider would like to make medication changes.

## 2023-05-17 LAB — MISC LABCORP TEST (SEND OUT): Labcorp test code: 143000

## 2023-05-20 ENCOUNTER — Ambulatory Visit: Payer: Medicaid Other | Admitting: Internal Medicine

## 2023-05-23 ENCOUNTER — Other Ambulatory Visit: Payer: Self-pay

## 2023-05-23 NOTE — Patient Outreach (Signed)
Medicaid Managed Care Social Work Note  05/23/2023 Name:  Molly Howard MRN:  [id number] DOB:  Jul 24, 1985  Molly Howard is an 38 y.o. year old female who is a primary patient of Molly Mail, DO.  The Medicaid Managed Care Coordination team was consulted for assistance with:  Community Resources   Ms. Favero was given information about Medicaid Managed Care Coordination team services today. Molly Howard Patient agreed to services and verbal consent obtained.  Engaged with patient  for by telephone forfollow up visit in response to referral for case management and/or care coordination services.   Patient is participating in a Managed Medicaid Plan:  Yes  Assessments/Interventions:  Review of past medical history, allergies, medications, health status, including review of consultants reports, laboratory and other test data, was performed as part of comprehensive evaluation and provision of chronic care management services.  SDOH: (Social Drivers of Health) assessments and interventions performed: SDOH Interventions    Flowsheet Row Telephone from 04/21/2023 in [place] POPULATION HEALTH DEPARTMENT ED to Hosp-Admission (Discharged) from 04/15/2023 in [facility]IONAL CARDIAC MED PCU Patient Outreach Telephone from 04/04/2023 in [place] POPULATION HEALTH DEPARTMENT Patient Outreach Telephone from 11/24/2022 in [place] POPULATION HEALTH DEPARTMENT Telephone from 06/30/2020 in [facility] [place] Telephone from 06/16/2020 in [facility]  SDOH Interventions        Food Insecurity Interventions AMB Referral, Community Resources Provided -- Other (Comment)  [BSW referral] Other (Comment)  [working with BSW for food assistance] -- Other (Comment)  [will help with transportation to get to food pantries as that was the main barrier]  Housing Interventions AMB Referral, Other (Comment)  [Followed by [facility] CM and SW] Inpatient TOC  Other (Comment)  [BSW referral] Intervention Not Indicated -- Other (Comment)  [referred to [facility] to apply for low income water assistance program]  Transportation Interventions AMB Referral, Other (Comment) Other (Comment)  [Pt uses Medicaid for transportation] -- Payor Benefit  [Provided with UHC transportation [phone] [employer] [facility] [employer] [facility]  Utilities Interventions AMB Referral, Other (Comment)  [currently lives in motel] -- -- Intervention Not Indicated -- --  Alcohol Usage Interventions -- Intervention Not Indicated (Score <7) -- -- -- --  Financial Strain Interventions -- Inpatient TOC -- -- -- Other (Comment)  [providing [employer] gift cards to help with medication costs]     BSW completed a telephone outreach with patient, she states they are no longer living in the hotel and they are staying with a friend. They are able to stay there until they find a place. Patient states she has received an update on her disability and has to Howard some forms back. No resources are needed at this time.   Advanced Directives Status:  Not addressed in this encounter.  Care Plan                 Allergies  Allergen Reactions   No [employer] Food Allergies Rash and Other (See Comments)    Lemons    Medications Reviewed Today   Medications were not reviewed in this encounter     Patient Active Problem List   Diagnosis Date Noted   Primary hypertension 04/16/2023   Controlled type 2 diabetes mellitus without complication, without long-term current use of insulin (HCC) 04/15/2023   Cardiogenic shock (HCC) 11/14/2022   Bilateral leg edema 11/06/2022   NICM (nonischemic cardiomyopathy) (HCC) 07/31/2022   Hypocalcemia 07/25/2022   Leg edema 07/25/2022   Acute  on chronic combined systolic and diastolic CHF (congestive heart failure) (HCC) 07/25/2022   GERD without esophagitis 07/24/2022   Cocaine abuse (HCC) 02/12/2022   Acute pulmonary edema (HCC)    Acute  systolic heart failure (HCC) 02/09/2022   Syncope 02/09/2022   Menorrhagia with regular cycle 06/22/2021   Dyslipidemia 11/27/2020   Chronic kidney disease (CKD), stage II (mild) 11/27/2020   Hypomagnesemia 10/17/2020   Dyspnea 05/21/2020   Essential hypertension 05/20/2020   Diabetes mellitus, type II (HCC) 05/20/2020   Chronic anemia 05/20/2020   Reactive thrombocytosis 05/20/2020   Obesity (BMI 30-39.9) 05/20/2020   Chronic combined systolic and diastolic heart failure (HCC) 11/01/2019   Financial difficulties 11/01/2019   Medication management 11/01/2019   Lung nodule 11/01/2019   Nonischemic cardiomyopathy (HCC) 11/01/2019   Polysubstance abuse (HCC) 11/01/2019   Tobacco use 11/01/2019   Swelling    Symptomatic anemia 10/02/2019    Conditions to be addressed/monitored per PCP order:   community resources  There are no care plans that you recently modified to display for this patient.   Follow up:  Patient agrees to Care Plan and Follow-up.  Plan: The  Patient has been provided with contact information for the Managed Medicaid care management team and has been advised to call with any health related questions or concerns.  Date/time of next scheduled Social Work care management/care coordination outreach:   Molly Howard, Molly Howard, [facility]  Managed [facility] Social Worker [phone]

## 2023-05-23 NOTE — Patient Instructions (Signed)
Visit Information  Molly Howard was given information about Medicaid Managed Care team care coordination services as a part of their [facility] Community Plan Medicaid benefit. Molly Howard verbally consented to engagement with the [facility] Managed Care team.   If you are experiencing a medical emergency, please call 911 or report to your local emergency department or urgent care.   If you have a non-emergency medical problem during routine business hours, please contact your provider's office and ask to speak with a nurse.   For questions related to your [facility], please call: [phone] or visit the homepage here: [email or web address]  If you would like to schedule transportation through your [facility], please call the following number at least 2 days in advance of your appointment: [phone]   Rides for urgent appointments can also be made after hours by calling Member Services.  Call the Behavioral Health Crisis Line at [phone], at any time, 24 hours a day, 7 days a week. If you are in danger or need immediate medical attention call 911.  If you would like help to quit smoking, call 1-800-QUIT-NOW (364-511-7024) OR Espaol: 1-855-Djelo-Ya (1-324-401-0272) o para ms informacin haga clic aqu or Text READY to 536-644 to register via text  Ms. Biederman - following are the goals we discussed in your visit today:   Goals Addressed   None      The  Patient                                              has been provided with contact information for the Managed Medicaid care management team and has been advised to call with any health related questions or concerns.   Gus Puma, Kenard Gower, MHA [facility]  Managed Medicaid Social Worker [phone]   Following is a copy of your plan of care:  There are no care plans that you recently modified to display for this  patient.

## 2023-05-25 ENCOUNTER — Telehealth: Payer: Self-pay | Admitting: Family

## 2023-05-25 ENCOUNTER — Telehealth: Payer: Self-pay | Admitting: Pharmacist

## 2023-05-25 ENCOUNTER — Other Ambulatory Visit: Payer: Self-pay | Admitting: Pharmacist

## 2023-05-25 ENCOUNTER — Encounter: Payer: Medicaid Other | Admitting: Family

## 2023-05-25 NOTE — Progress Notes (Signed)
   Outreach Note  05/25/2023 Name: TALEEYA HAMLYN MRN: [id number] DOB: February 23, 1986  Referred by: Margarita Mail, DO   Reach patient by telephone today, but she states that now is not a good time and asks that I call back another day     Follow Up Plan: Will attempt to reach patient by telephone again next month   Estelle Grumbles, PharmD, [facility] [phone]

## 2023-05-25 NOTE — Telephone Encounter (Signed)
Patient did not show for her Heart Failure Clinic appointment on 05/25/23.

## 2023-05-25 NOTE — Progress Notes (Deleted)
PCP: Margarita Mail, DO (last seen 10/24) Primary Cardiologist: Debbe Odea, MD/ Eula Listen, PA (last seen 11/24)  Chief Complaint:    HPI:  Molly Howard is a 38 y/o female with a history of DM, HTN, previous drug use, NSVT, PSVT, mod/severe TR, CKD, anemia and chronic biventricular heart failure.   Admitted 02/09/22 due to acute on chronic HF. + cocaine. Discharged after 7 days. Admitted 07/24/22 due to decompensated HFrEF. Received IV Lasix. Was on milrinone drip. Digoxin added. Uop about 41 liters with decrease in weight 116 kg >>91 kg. Admitted 11/05/22 due to swelling and fluid retention in arms and legs along with some central chest tightness and shortness of breath and all has been going on for about a week. Needed lasix gtt with milrinone and diuresing well,  total UOP >40 liters. Weight significantly down 130 kg-->97 kg. Weaned off of IV drips. Spironolactone stopped due to hyperkalemia. Urine drug screen + for cocaine. Negative for dvt/stenosis. X-ray nothing acute to explain left arm swelling.    Admitted 04/15/23 due to worsening dyspnea with associated bilateral lower extremity edema. Had orthopnea and PND. Had not been on her diuretic because she had gotten kicked out of the house and was unable to go back for her medications. IV diuresed. Advanced HF team consulted. Venofer given for anemia. ICD deferred due to HF exacerbation.   Echo 10/11/20: EF 30-35% along with moderate TR.  Echo 03/20/21: EF 25-30% along with severely elevated PA pressure of 70.8 mmHg, mild LAE, mild MR and mild/moderate TR Echo 06/30/21: EF  30-35% along with mild MR. Echo 09/18/21: EF  25-30%.   Echo 07/25/22: EF 25-30% with moderately elevated PA pressure of 50.7 mmHg, mild LAE, mild MR, small pericardial effusion and moderate/ severe TR  Cardiac MRI 05/19/21:  1. Mild LV dilatation, mild hypertrophy, and mild systolic dysfunction (EF 42%)  2.  No late gadolinium enhancement to suggest myocardial scar   3.  Normal RV size and systolic function (EF 50%)  RHC 07/28/22: Severely elevated right and left-sided filling pressures, moderate pulmonary hypertension and moderately reduced cardiac output. RA: 30 mmHg RV: 56/14 mmHg PW: 34 mmHg PA: 54/30 with a mean of 41 mmHg PA sat is 42% Cardiac output: 4.47 with a cardiac index of 2. CPO is 0.89 PAPI: 0.8  Zio patch 12/2022: Patient had a min HR of 62 bpm, max HR of 187 bpm, and avg HR of 76 bpm. Predominant underlying rhythm was Sinus Rhythm. 24 Ventricular Tachycardia runs occurred, the run with the fastest interval lasting 7 beats with a max rate of 187 bpm, the longest lasting 13 beats with an avg rate of 128 bpm. 1 run of Supraventricular Tachycardia occurred lasting 6 beats with a max rate of 135 bpm (avg 116 bpm). Isolated SVEs were rare (<1.0%), SVE Couplets were rare (<1.0%), and SVE Triplets were rare (<1.0%). Isolated VEs were rare (<1.0%, 1371), VE Couplets were rare (<1.0%, 120), and VE Triplets were rare (<1.0%, 26). Ventricular Bigeminy was present.  Conclusion Occasional nonsustained VT. 1 episode of paroxysmal SVT. No sustained arrhythmias. No atrial fibrillation or atrial flutter.  She presents today for a HF follow-up visit with a chief complaint of    Due to having HF exacerbation with hospital stay, ICD implantation had to be deferred. She is hoping to have the ICD implanted early 2025.  Drinks ~ 32 oz soda and 32 oz water daily. No further alcohol. No drug use since 07/24. Currently living at [facility]  with her sister.    ROS: All systems negative except as listed in HPI, PMH and Problem List.  SH:  Social History   Socioeconomic History   Marital status: Single    Spouse name: Not on file   Number of children: 0   Years of education: Not on file   Highest education level: 12th grade  Occupational History   Occupation: Diaabled  Tobacco Use   Smoking status: Former    Current packs/day: 0.00    Types: Cigarettes     Quit date: 2022    Years since quitting: 3.0   Smokeless tobacco: Never  Vaping Use   Vaping status: Never Used  Substance and Sexual Activity   Alcohol use: Not Currently   Drug use: Not Currently    Types: Cocaine    Comment: Admits to using cocaine up and will July 2024.   Sexual activity: Not Currently    Birth control/protection: None  Other Topics Concern   Not on file  Social History Narrative   Lives locally.  Does not routinely exercise.  Has been using cocaine.   Social Drivers of Corporate investment banker Strain: High Risk (04/15/2023)   Overall Financial Resource Strain (CARDIA)    Difficulty of Paying Living Expenses: Very hard  Food Insecurity: Food Insecurity Present (04/21/2023)   Hunger Vital Sign    Worried About Running Out of Food in the Last Year: Often true    Ran Out of Food in the Last Year: Often true  Transportation Needs: Unmet Transportation Needs (04/21/2023)   PRAPARE - Administrator, Civil Service (Medical): Yes    Lack of Transportation (Non-Medical): No  Physical Activity: Not on file  Stress: Not on file  Social Connections: Not on file  Intimate Partner Violence: Not At Risk (04/21/2023)   Humiliation, Afraid, Rape, and Kick questionnaire    Fear of Current or Ex-Partner: No    Emotionally Abused: No    Physically Abused: No    Sexually Abused: No    FH:  Family History  Problem Relation Age of Onset   Heart failure Mother        Onset of heart failure 40s.  Died in 05/23/2022.   Diabetes Mother    Hypertension Father    Diabetes Father     Past Medical History:  Diagnosis Date   Acid reflux    Chronic HFrEF (heart failure with reduced ejection fraction) (HCC)    a. 10/2019 Echo: EF 35-40%, GrII DD; b. 05/2020 Echo: EF 20-25%, glob HK; c. 10/2020 Echo: EF 30-35%, glob HK. GrII DD, Mildly red RV fxn. Mod TR; d.  05/2021 cMRI: EF 42%, no LGE. Nl RV size/fxn.   CKD (chronic kidney disease), stage II    Diabetes  mellitus (HCC)    H/O medication noncompliance    Hypertension    Microcytic anemia    NICM (nonischemic cardiomyopathy) (HCC)    a. 10/2019 Echo: EF 35-40%; b. 10/2019 MV: No ischemia. Small apical defect-->breast attenuation; c. 05/2020 Echo: EF 20-25%; d. 10/2020 Echo: EF 30-35%, glob HK. GrII DD, Mildly red RV fxn. Mod TR; e. 05/2021 cMRI: EF 42%, no LGE. Nl RV size/fxn.   Obesity    Polysubstance abuse (HCC)     Current Outpatient Medications  Medication Sig Dispense Refill   Accu-Chek Softclix Lancets lancets Use as instructed 100 each 12   carvedilol (COREG) 12.5 MG tablet Take 1 tablet (12.5 mg total) by mouth 2 (  two) times daily with a meal. 60 tablet 1   Continuous Glucose Sensor (FREESTYLE LIBRE 3 SENSOR) MISC Place 1 sensor on the skin every 14 days. Use to check glucose continuously (Patient not taking: Reported on 04/29/2023) 2 each 12   dapagliflozin propanediol (FARXIGA) 10 MG TABS tablet Take 1 tablet (10 mg total) by mouth once daily. 30 tablet 1   digoxin (LANOXIN) 0.125 MG tablet Take 0.5 tablets (0.0625 mg total) by mouth daily. 30 tablet 3   sacubitril-valsartan (ENTRESTO) 97-103 MG Take 1 tablet by mouth 2 (two) times daily. 60 tablet 1   Semaglutide,0.25 or 0.5MG /DOS, (OZEMPIC, 0.25 OR 0.5 MG/DOSE,) 2 MG/3ML SOPN Inject 0.5 mg into the skin once a week. 3 mL 1   spironolactone (ALDACTONE) 25 MG tablet Take 1 tablet (25 mg total) by mouth daily. 30 tablet 1   torsemide (DEMADEX) 20 MG tablet Take 2 tablets (40 mg total) by mouth daily 60 tablet 1   No current facility-administered medications for this visit.      PHYSICAL EXAM:  General:  Well appearing. No resp difficulty HEENT: normal Neck: supple. JVP flat. No lymphadenopathy or thryomegaly appreciated. Cor: PMI normal. Regular rate & rhythm. No rubs, gallops. II/ VI murmur LSB Lungs: clear Abdomen: soft, nontender, nondistended. No hepatosplenomegaly. No bruits or masses.  Extremities: no cyanosis, clubbing,  rash, edema Neuro: alert & orientedx3, cranial nerves grossly intact. Moves all 4 extremities w/o difficulty. Affect pleasant.   ECG: not done   ASSESSMENT & PLAN:  1: NICM with reduced ejection fraction- - suspect due to previous cocaine use and uncontrolled HTN  - NYHA class II - euvolemic - weighing daily; reminded to call for an overnight weight gain of > 2 pounds or a weekly weight gain of > 5 pounds - weight 205.4 pounds from last visit here 3 weeks ago - Echo 10/11/20: EF 30-35% along with moderate TR.  - Echo 03/20/21: EF 25-30% along with severely elevated PA pressure of 70.8 mmHg, mild LAE, mild MR and mild/moderate TR - Echo 06/30/21: EF  30-35% along with mild MR. - Echo 09/18/21: EF  25-30%.   - Echo 07/25/22: EF 25-30% with moderately elevated PA pressure of 50.7 mmHg, mild LAE, mild MR, small pericardial effusion and moderate/ severe TR - Cardiac MRI 05/19/21:    1. Mild LV dilatation, mild hypertrophy, and mild systolic   dysfunction (EF 42%)    2. No late gadolinium enhancement to suggest myocardial scar   3. Normal RV size and systolic function (EF 50%) - saw cardiology (Dunn) 11/24 - saw EP Lalla Brothers) 11/24 & will hopefully be having ICD implanted early 2025  - continue carvedilol 6.25mg  BID - continue farxiga 10mg  daily - continue digoxin 0.0625mg  daily; dig level 04/20/23 was 0.3 - continue entresto 97/103mg  BID  - continue spironolatone 25mg  daily - continue torsemide 40mg  daily - not a candidate for advanced therapies due to repeated + drug screens - BNP 04/14/23 was 1519.4  2: HTN- - BP  - saw PCP Angelica Chessman) 10/24 - BMP 04/25/23 showed sodium 133, potassium 4.6, creatinine 155 & GFR 44   3: DM- - A1c 02/17/23 was 7.7% - continue ozempic 0.25mg  weekly   4: Anemia- - Hg 04/18/23 showed hemoglobin 8.6  5: Substance use- - no tobacco - no drug use since 07/24 - denies any recent alcohol use

## 2023-05-30 NOTE — Progress Notes (Deleted)
Electrophysiology Clinic Note    Date:  05/30/2023  Patient ID:  Molly Howard, Molly Howard 05-28-1985, MRN [id number] PCP:  Margarita Mail, DO  Cardiologist:  Debbe Odea, MD Electrophysiologist: Lanier Prude, MD  ***refresh  Discussed the use of AI scribe software for clinical note transcription with the patient, who gave verbal consent to proceed.   Patient Profile    Chief Complaint: ICD discussion  History of Present Illness: Molly Howard is a 38 y.o. female with PMH notable for NICM, HFrEF, HTN, T2DM, IDA, T2DM, HTN, cocaine use; seen today for Lanier Prude, MD for post hospital follow up.   She last saw Dr. Lalla Brothers 03/2023. ICD discussed many times but deferred d/t variable LVEF. At that appt, planning for sub-Q ICD in 04/2023. Unfortunately, patient was admitted to [facility] with HF exacerbation at that time and procedure cancelled.  She had follow-up with NP [facility] 1/13 where she was again fluid overloaded and torsemide increased for 2 days.     - 2/14, 2/24, 3/5, 3/7   Since discharge from hospital the patient reports doing ***.  she denies chest pain, palpitations, dyspnea, PND, orthopnea, nausea, vomiting, dizziness, syncope, edema, weight gain, or early satiety.     Device Information: None   AAD History: None     ROS:  Please see the history of present illness. All other systems are reviewed and otherwise negative.    Physical Exam    VS:  There were no vitals taken for this visit. BMI: There is no height or weight on file to calculate BMI.  Wt Readings from Last 3 Encounters:  05/16/23 215 lb (97.5 kg)  04/25/23 205 lb 6.4 oz (93.2 kg)  04/20/23 203 lb (92.1 kg)     GEN- The patient is well appearing, alert and oriented x 3 today.   Lungs- Clear to ausculation bilaterally, normal work of breathing.  Heart- {Blank single:19197::"Regular","Irregularly irregular"} rate and rhythm, no murmurs, rubs or gallops Extremities- {EDEMA  LEVEL:28147::"No"} peripheral edema, warm, dry    Studies Reviewed   Previous EP, cardiology notes.    EKG is not ordered. Personal review of EKG from *** shows:  SR at 76bpm QRS 46ms        Long term monitor, 12/21/2022 Patient had a min HR of 62 bpm, max HR of 187 bpm, and avg HR of 76 bpm. Predominant underlying rhythm was Sinus Rhythm. 24 Ventricular Tachycardia runs occurred, the run with the fastest interval lasting 7 beats with a max rate of 187 bpm, the longest  lasting 13 beats with an avg rate of 128 bpm. 1 run of Supraventricular Tachycardia occurred lasting 6 beats with a max rate of 135 bpm (avg 116 bpm). Isolated SVEs were rare (<1.0%), SVE Couplets were rare (<1.0%), and SVE Triplets were rare (<1.0%).  Isolated VEs were rare (<1.0%, 1371), VE Couplets were rare (<1.0%, 120), and VE Triplets were rare (<1.0%, 26). Ventricular Bigeminy was present.    Conclusion Occasional nonsustained VT. 1 episode of paroxysmal SVT. No sustained arrhythmias. No atrial fibrillation or atrial flutter.  RHC, 07/28/2022 Successful right heart catheterization via the right antecubital vein. Severely elevated right and left-sided filling pressures, moderate pulmonary hypertension and moderately reduced cardiac output.   RA: 30 mmHg RV: 56/14 mmHg PW: 34 mmHg PA: 54/30 with a mean of 41 mmHg PA sat is 42% Cardiac output: 4.47 with a cardiac index of 2. CPO is 0.89 PAPI: 0.8  TTE, 07/25/2022  1. Left  ventricular ejection fraction, by estimation, is 25 to 30%. Left ventricular ejection fraction by 2D MOD biplane is 21.6 %. The left ventricle has severely decreased function. The left ventricle demonstrates global hypokinesis. Left ventricular diastolic parameters are indeterminate.   2. Right ventricular systolic function is mildly reduced. The right ventricular size is normal. There is moderately elevated pulmonary artery systolic pressure. The estimated right ventricular systolic pressure  is 50.7 mmHg.   3. Left atrial size was mildly dilated.   4. A small pericardial effusion is present. The pericardial effusion is circumferential. There is no evidence of cardiac tamponade.   5. The aortic valve is tricuspid. Aortic valve regurgitation is not visualized. No aortic stenosis is present.   6. The mitral valve is normal in structure. Mild mitral valve regurgitation. No evidence of mitral stenosis.   7. Tricuspid valve regurgitation is moderate to severe.   TTE, 09/18/2021  1. Left ventricular ejection fraction, by estimation, is 25 to 30%. Left ventricular ejection fraction by 3D volume is 28 %. The left ventricle has severely decreased function. The left ventricle demonstrates global hypokinesis. Left ventricular  diastolic parameters are consistent with Grade II diastolic dysfunction (pseudonormalization). The average left ventricular global longitudinal strain is -5.9 %. The global longitudinal strain is abnormal.   2. Right ventricular systolic function is mildly reduced. The right ventricular size is mildly enlarged. Tricuspid regurgitation signal is inadequate for assessing PA pressure.   3. The mitral valve is normal in structure. No evidence of mitral valve regurgitation. No evidence of mitral stenosis.   4. The aortic valve is tricuspid. Aortic valve regurgitation is not visualized. No aortic stenosis is present.   5. The inferior vena cava is normal in size with greater than 50% respiratory variability, suggesting right atrial pressure of 3 mmHg.    Assessment and Plan     #) NICM #) HFrEF LVEF remains reduced at 25-30% Narrow QRS Discussed ICD implant procedure, previously recommended sub-Q ***   #) ***   {Are you ordering a CV Procedure (e.g. stress test, cath, DCCV, TEE, etc)?   Press F2        :[id number]}   Current medicines are reviewed at length with the patient today.   The patient {ACTIONS; HAS/DOES NOT HAVE:19233} concerns regarding her medicines.  The  following changes were made today:  {NONE DEFAULTED:18576}  Labs/ tests ordered today include: *** No orders of the defined types were placed in this encounter.    Disposition: Follow up with {EPMDS:28135} or EP APP {EPFOLLOW UP:28173}   Signed, Sherie Don, NP  05/30/23  1:02 PM  Electrophysiology [facility]

## 2023-05-31 ENCOUNTER — Ambulatory Visit: Payer: Medicaid Other | Admitting: Cardiology

## 2023-06-03 ENCOUNTER — Other Ambulatory Visit: Payer: Self-pay

## 2023-06-03 ENCOUNTER — Other Ambulatory Visit: Payer: Self-pay | Admitting: *Deleted

## 2023-06-03 NOTE — Patient Outreach (Signed)
Medicaid Managed Care   Nurse Care Manager Note  06/03/2023 Name:  Molly Howard MRN:  [id number] DOB:  29-Jul-1985  Molly Howard is an 38 y.o. year old female who is a primary patient of Molly Mail, DO.  The [facility] Managed Care Coordination team was consulted for assistance with:    DMII  Molly Howard was given information about Medicaid Managed Care Coordination team services today. Molly Howard Patient agreed to services and verbal consent obtained.  Engaged with patient by telephone for follow up visit in response to provider referral for case management and/or care coordination services.   Patient is participating in a Managed Medicaid Plan:  Yes  Assessments/Interventions:  Review of past medical history, allergies, medications, health status, including review of consultants reports, laboratory and other test data, was performed as part of comprehensive evaluation and provision of chronic care management services.  SDOH (Social Drivers of Health) assessments and interventions performed: SDOH Interventions    Flowsheet Row Telephone from 04/21/2023 in [place] POPULATION HEALTH DEPARTMENT ED to Hosp-Admission (Discharged) from 04/15/2023 in [facility]IONAL CARDIAC MED PCU Patient Outreach Telephone from 04/04/2023 in [place] POPULATION HEALTH DEPARTMENT Patient Outreach Telephone from 11/24/2022 in [place] POPULATION HEALTH DEPARTMENT Telephone from 06/30/2020 in [facility] [place] Telephone from 06/16/2020 in [facility]  SDOH Interventions        Food Insecurity Interventions AMB Referral, Community Resources Provided -- Other (Comment)  [BSW referral] Other (Comment)  [working with BSW for food assistance] -- Other (Comment)  [will help with transportation to get to food pantries as that was the main barrier]  Housing Interventions AMB Referral, Other (Comment)  [Followed by Longitudinal [facility] CM and SW] Inpatient TOC  Other (Comment)  [BSW referral] Intervention Not Indicated -- Other (Comment)  [referred to [facility] to apply for low income water assistance program]  Transportation Interventions AMB Referral, Other (Comment) Other (Comment)  [Pt uses Medicaid for transportation] -- Payor Benefit  [Provided with UHC transportation [phone] [employer] [facility] [employer] [facility]  Utilities Interventions AMB Referral, Other (Comment)  [currently lives in motel] -- -- Intervention Not Indicated -- --  Alcohol Usage Interventions -- Intervention Not Indicated (Score <7) -- -- -- --  Financial Strain Interventions -- Inpatient TOC -- -- -- Other (Comment)  [providing [employer] gift cards to help with medication costs]       Care Plan  Allergies  Allergen Reactions   No [employer] Food Allergies Rash and Other (See Comments)    Lemons    Medications Reviewed Today     Reviewed by Molly Dach, RN (Registered Nurse) on 06/03/23 at 1504  Med List Status: <None>   Medication Order Taking? Sig Documenting Provider Last Dose Status Informant  Accu-Chek Softclix Lancets lancets [id number] No Use as instructed  Patient not taking: Reported on 06/03/2023   Molly Mail, DO Not Taking Active            Med Note Molly Howard   Thu Apr 21, 2023 10:18 AM) Does not have glucometer   carvedilol (COREG) 12.5 MG tablet [id number] Yes Take 1 tablet (12.5 mg total) by mouth 2 (two) times daily with a meal. Howard, Molly Curtis, MD Taking Active   Continuous Glucose Sensor (FREESTYLE LIBRE 3 SENSOR) [place] [id number] No Place 1 sensor on the skin every 14 days. Use to check glucose continuously  Patient not taking: Reported on 06/03/2023   Molly Mail, DO Not Taking  Active            Med Note (Howard, Molly Howard   Thu Apr 21, 2023 10:19 AM) Does not have this ,needs refill   dapagliflozin propanediol (FARXIGA) 10 MG TABS tablet [id number] Yes Take 1 tablet (10 mg total) by mouth once daily.  Howard, Molly Curtis, MD Taking Active   digoxin (LANOXIN) 0.125 MG tablet [id number] Yes Take 0.5 tablets (0.0625 mg total) by mouth daily. Howard, Molly Curtis, MD Taking Active     Discontinued 10/18/20 1034 (Reorder) sacubitril-valsartan (ENTRESTO) 97-103 MG [id number] Yes Take 1 tablet by mouth 2 (two) times daily. Howard, Molly Curtis, MD Taking Active   Semaglutide,0.25 or 0.5MG /DOS, (OZEMPIC, 0.25 OR 0.5 MG/DOSE,) 2 MG/3ML SOPN [id number] Yes Inject 0.5 mg into the skin once a week. Molly Freeze, FNP Taking Active   spironolactone (ALDACTONE) 25 MG tablet [id number] Yes Take 1 tablet (25 mg total) by mouth daily. Howard, Molly Curtis, MD Taking Active   torsemide [facility]) 20 MG tablet [id number] Yes Take 2 tablets (40 mg total) by mouth daily Howard, Molly Curtis, MD Taking Active   Med List Note Molly Howard 11/27/20 5366): Patient states she has been out of her medications for "a while now". It is due to financial limitations.            Patient Active Problem List   Diagnosis Date Noted   Primary hypertension 04/16/2023   Controlled type 2 diabetes mellitus without complication, without long-term current use of insulin (HCC) 04/15/2023   Cardiogenic shock (HCC) 11/14/2022   Bilateral leg edema 11/06/2022   NICM (nonischemic cardiomyopathy) (HCC) 07/31/2022   Hypocalcemia 07/25/2022   Leg edema 07/25/2022   Acute on chronic combined systolic and diastolic CHF (congestive heart failure) (HCC) 07/25/2022   GERD without esophagitis 07/24/2022   Cocaine abuse (HCC) 02/12/2022   Acute pulmonary edema (HCC)    Acute systolic heart failure (HCC) 02/09/2022   Syncope 02/09/2022   Menorrhagia with regular cycle 06/22/2021   Dyslipidemia 11/27/2020   Chronic kidney disease (CKD), stage II (mild) 11/27/2020   Hypomagnesemia 10/17/2020   Dyspnea 05/21/2020   Essential hypertension 05/20/2020   Diabetes mellitus, type II (HCC) 05/20/2020   Chronic anemia 05/20/2020   Reactive  thrombocytosis 05/20/2020   Obesity (BMI 30-39.9) 05/20/2020   Chronic combined systolic and diastolic heart failure (HCC) 11/01/2019   Financial difficulties 11/01/2019   Medication management 11/01/2019   Lung nodule 11/01/2019   Nonischemic cardiomyopathy (HCC) 11/01/2019   Polysubstance abuse (HCC) 11/01/2019   Tobacco use 11/01/2019   Swelling    Symptomatic anemia 10/02/2019    Conditions to be addressed/monitored per PCP order:  DMII  Care Plan : RN Care Manager Plan of Care  Updates made by Molly Dach, RN since 06/03/2023 12:00 AM     Problem: Health Management needs related to DMII      Long-Range Goal: Development of Plan of Care to address Health Management needs related to DMII   Start Date: 11/24/2022  Expected End Date: 02/22/2023  Note:   Current Barriers:  Chronic Disease Management support and education needs related to DMII-Molly Howard is living with her friend reports taking all prescribed medication. She is frustrated that she has not had surgery for pacemaker placement. Recently missed visit with HF Clinic and PCP.  RNCM Clinical Goal(s):  Patient will verbalize understanding of plan for management of DMII as evidenced by patient reports take all medications exactly as prescribed and will call provider  for medication related questions as evidenced by patient reports    attend all scheduled medical appointments: reschedule missed visit with PCP and HF Clinic, Cardiology on 06/06/23 and Pharmacist on 06/29/23 as evidenced by provider documentation in EMR        continue to work with RN Care Manager and/or Social Worker to address care management and care coordination needs related to DMII as evidenced by adherence to CM Team Scheduled appointments     work with Child psychotherapist to address Financial constraints related to no income and Limited access to food related to the management of DMII as evidenced by review of EMR and patient or Child psychotherapist report     through  collaboration with Medical illustrator, provider, and care team.   Interventions: Inter-disciplinary care team collaboration (see longitudinal plan of care) Evaluation of current treatment plan related to  self management and patient's adherence to plan as established by provider   Diabetes:  (Status: Goal on Track (progressing): YES.) Long Term Goal   Lab Results  Component Value Date   HGBA1C 7.7 (A) 02/17/2023   @ Assessed patient's understanding of A1c goal: <7% Provided education to patient about basic DM disease process; Reviewed medications with patient and discussed importance of medication adherence;        Reviewed prescribed diet with patient MyPlate method, decrease carbohydrates, sweets and sweet drinks; Discussed plans with patient for ongoing care management follow up and provided patient with direct contact information for care management team;      Reviewed scheduled/upcoming provider appointments including: rescheduling with PCP and HF Clinic, Cardiology on 06/06/23 and Pharmacist on 06/29/23;         Review of patient status, including review of consultants reports, relevant laboratory and other test results, and medications completed;       Assessed social determinant of health barriers;        Advised patient to contact [facility] member services [phone] for member benefits(fresh fruit and vegetables)-revisited Discussed disability application-patient now working with a lawyer Advised patient to journal BS readings and take to next PCP appointment Advised patient to drink 60-64 ounces of water a day, encouraged to cut down on soda intake Assisted with requesting medication refills, advised she can pick up torsemide today and all other medications on Monday Ensured patient has transportation for appointment on 06/06/23 Provided with therapeutic listening   Patient Goals/Self-Care Activities: Take medications as prescribed   Attend all scheduled provider appointments Call  provider office for new concerns or questions  Work with the social worker to address care coordination needs and will continue to work with the clinical team to address health care and disease management related needs schedule appointment with eye doctor drink 6 to 8 glasses of water each day fill half of plate with vegetables limit fast food meals to no more than 1 per week       Follow Up:  Patient agrees to Care Plan and Follow-up.  Plan: The Managed Medicaid care management team will reach out to the patient again over the next 30 days.  Date/time of next scheduled RN care management/care coordination outreach:  07/05/23 at 1:15pm  Estanislado Emms RN, BSN [place]  [facility] RN Care Manager [phone]

## 2023-06-05 NOTE — Progress Notes (Deleted)
 Electrophysiology Clinic Note    Date:  06/05/2023  Patient ID:  Molly Howard June 02, 1985, MRN [id number] PCP:  Molly Mail, DO  Cardiologist:  Molly Odea, MD Electrophysiologist: Molly Prude, MD  ***refresh  Discussed the use of AI scribe software for clinical note transcription with the patient, who gave verbal consent to proceed.   Patient Profile    Chief Complaint: ICD discussion  History of Present Illness: Molly Howard is a 38 y.o. female with PMH notable for NICM, HFrEF, HTN, T2DM, IDA, T2DM, HTN, cocaine use; seen today for Molly Prude, MD for post hospital follow up.   She last saw Dr. Lalla Howard 03/2023. ICD discussed many times but deferred d/t variable LVEF. At that appt, planning for sub-Q ICD in 04/2023. Unfortunately, patient was admitted to [facility] with HF exacerbation at that time and procedure cancelled.  She had follow-up with NP Molly Howard 1/13 where she was again fluid overloaded and torsemide increased for 2 days.     - 2/14, 2/24, 3/5, 3/7   Since discharge from hospital the patient reports doing ***.  she denies chest pain, palpitations, dyspnea, PND, orthopnea, nausea, vomiting, dizziness, syncope, edema, weight gain, or early satiety.     Device Information: None   AAD History: None     ROS:  Please see the history of present illness. All other systems are reviewed and otherwise negative.    Physical Exam    VS:  There were no vitals taken for this visit. BMI: There is no height or weight on file to calculate BMI.  Wt Readings from Last 3 Encounters:  05/16/23 215 lb (97.5 kg)  04/25/23 205 lb 6.4 oz (93.2 kg)  04/20/23 203 lb (92.1 kg)     GEN- The patient is well appearing, alert and oriented x 3 today.   Lungs- Clear to ausculation bilaterally, normal work of breathing.  Heart- {Blank single:19197::"Regular","Irregularly irregular"} rate and rhythm, no murmurs, rubs or gallops Extremities- {EDEMA  LEVEL:28147::"No"} peripheral edema, warm, dry    Studies Reviewed   Previous EP, cardiology notes.    EKG is not ordered. Personal review of EKG from  05/16/2023  shows:  SR at 76bpm QRS 46ms        Long term monitor, 12/21/2022 Patient had a min HR of 62 bpm, max HR of 187 bpm, and avg HR of 76 bpm. Predominant underlying rhythm was Sinus Rhythm. 24 Ventricular Tachycardia runs occurred, the run with the fastest interval lasting 7 beats with a max rate of 187 bpm, the longest  lasting 13 beats with an avg rate of 128 bpm. 1 run of Supraventricular Tachycardia occurred lasting 6 beats with a max rate of 135 bpm (avg 116 bpm). Isolated SVEs were rare (<1.0%), SVE Couplets were rare (<1.0%), and SVE Triplets were rare (<1.0%).  Isolated VEs were rare (<1.0%, 1371), VE Couplets were rare (<1.0%, 120), and VE Triplets were rare (<1.0%, 26). Ventricular Bigeminy was present.    Conclusion Occasional nonsustained VT. 1 episode of paroxysmal SVT. No sustained arrhythmias. No atrial fibrillation or atrial flutter.  RHC, 07/28/2022 Successful right heart catheterization via the right antecubital vein. Severely elevated right and left-sided filling pressures, moderate pulmonary hypertension and moderately reduced cardiac output.   RA: 30 mmHg RV: 56/14 mmHg PW: 34 mmHg PA: 54/30 with a mean of 41 mmHg PA sat is 42% Cardiac output: 4.47 with a cardiac index of 2. CPO is 0.89 PAPI: 0.8  TTE, 07/25/2022  1. Left ventricular ejection fraction, by estimation, is 25 to 30%. Left ventricular ejection fraction by 2D MOD biplane is 21.6 %. The left ventricle has severely decreased function. The left ventricle demonstrates global hypokinesis. Left ventricular diastolic parameters are indeterminate.   2. Right ventricular systolic function is mildly reduced. The right ventricular size is normal. There is moderately elevated pulmonary artery systolic pressure. The estimated right ventricular systolic  pressure is 50.7 mmHg.   3. Left atrial size was mildly dilated.   4. A small pericardial effusion is present. The pericardial effusion is circumferential. There is no evidence of cardiac tamponade.   5. The aortic valve is tricuspid. Aortic valve regurgitation is not visualized. No aortic stenosis is present.   6. The mitral valve is normal in structure. Mild mitral valve regurgitation. No evidence of mitral stenosis.   7. Tricuspid valve regurgitation is moderate to severe.   TTE, 09/18/2021  1. Left ventricular ejection fraction, by estimation, is 25 to 30%. Left ventricular ejection fraction by 3D volume is 28 %. The left ventricle has severely decreased function. The left ventricle demonstrates global hypokinesis. Left ventricular  diastolic parameters are consistent with Grade II diastolic dysfunction (pseudonormalization). The average left ventricular global longitudinal strain is -5.9 %. The global longitudinal strain is abnormal.   2. Right ventricular systolic function is mildly reduced. The right ventricular size is mildly enlarged. Tricuspid regurgitation signal is inadequate for assessing PA pressure.   3. The mitral valve is normal in structure. No evidence of mitral valve regurgitation. No evidence of mitral stenosis.   4. The aortic valve is tricuspid. Aortic valve regurgitation is not visualized. No aortic stenosis is present.   5. The inferior vena cava is normal in size with greater than 50% respiratory variability, suggesting right atrial pressure of 3 mmHg.    Assessment and Plan     #) NICM #) HFrEF LVEF remains reduced at 25-30% Narrow QRS Discussed ICD implant procedure, previously recommended sub-Q ***   #) ***   {Are you ordering a CV Procedure (e.g. stress test, cath, DCCV, TEE, etc)?   Press F2        :[id number]}   Current medicines are reviewed at length with the patient today.   The patient {ACTIONS; HAS/DOES NOT HAVE:19233} concerns regarding her  medicines.  The following changes were made today:  {NONE DEFAULTED:18576}  Labs/ tests ordered today include: *** No orders of the defined types were placed in this encounter.    Disposition: Follow up with {EPMDS:28135} or EP APP {EPFOLLOW UP:28173}   Signed, Sherie Don, NP  06/05/23  5:15 PM  Electrophysiology [facility]

## 2023-06-06 ENCOUNTER — Ambulatory Visit: Payer: Medicaid Other | Admitting: Cardiology

## 2023-06-06 ENCOUNTER — Other Ambulatory Visit: Payer: Self-pay

## 2023-06-06 DIAGNOSIS — I428 Other cardiomyopathies: Secondary | ICD-10-CM

## 2023-06-06 DIAGNOSIS — I5020 Unspecified systolic (congestive) heart failure: Secondary | ICD-10-CM

## 2023-06-06 NOTE — Progress Notes (Signed)
 Electrophysiology Clinic Note    Date:  06/07/2023  Patient ID:  Molly Howard, Molly Howard 08-Apr-1986, MRN [id number] PCP:  [name], DO  Cardiologist:  Redell Cave, MD HF Cardiologist: NP Donette Electrophysiologist: OLE ONEIDA HOLTS, MD   Discussed the use of AI scribe software for clinical note transcription with the patient, who gave verbal consent to proceed.   Patient Profile    Chief Complaint: ICD discussion  History of Present Illness: Molly Howard is a 38 y.o. female with PMH notable for NICM, HFrEF, HTN, T2DM, IDA, T2DM, HTN, cocaine use; seen today for OLE ONEIDA HOLTS, MD for post hospital follow up.   She last saw Dr. Holts 03/2023. ICD discussed many times but deferred d/t variable LVEF. At that appt, planning for sub-Q ICD in 04/2023. Unfortunately, patient was admitted to [facility] with HF exacerbation at that time and procedure cancelled.  She had follow-up with NP [facility] 1/13 where she was again fluid overloaded and torsemide  increased for 2 days.   On follow-up today, she ran out of her medication last week and has been off all meds for 4 complete days. She has been talking with pharmacy who told her they would be ready today. She is having increased swelling with abd fullness. Also had some SOB walking from parking spot to the building today.  She missed a follow-up with NP [place] last week, she is planning to go upstairs to get rescheduled after our visit.  She is eager to move forward with ICD placement.      Device Information: None   AAD History: None     ROS:  Please see the history of present illness. All other systems are reviewed and otherwise negative.    Physical Exam    VS:  BP (!) 174/114 (BP Location: Left Arm, Patient Position: Sitting, Cuff Size: Normal)   Pulse 95   Ht 5' 7 (1.702 m)   Wt 219 lb 6 oz (99.5 kg)   SpO2 95%   BMI 34.36 kg/m  BMI: Body mass index is 34.36 kg/m.  Wt Readings from Last 3 Encounters:   06/07/23 219 lb 6 oz (99.5 kg)  05/16/23 215 lb (97.5 kg)  04/25/23 205 lb 6.4 oz (93.2 kg)     GEN- The patient is well appearing, alert and oriented x 3 today.   Lungs- Clear to ausculation bilaterally, normal work of breathing.  Heart- Regular rate and rhythm, no murmurs, rubs or gallops Extremities- 1+ peripheral edema, warm, dry    Studies Reviewed   Previous EP, cardiology notes.    EKG is not ordered. Personal review of EKG from  05/16/2023  shows:  SR at 76bpm QRS 46ms        Long term monitor, 12/21/2022 Patient had a min HR of 62 bpm, max HR of 187 bpm, and avg HR of 76 bpm. Predominant underlying rhythm was Sinus Rhythm. 24 Ventricular Tachycardia runs occurred, the run with the fastest interval lasting 7 beats with a max rate of 187 bpm, the longest  lasting 13 beats with an avg rate of 128 bpm. 1 run of Supraventricular Tachycardia occurred lasting 6 beats with a max rate of 135 bpm (avg 116 bpm). Isolated SVEs were rare (<1.0%), SVE Couplets were rare (<1.0%), and SVE Triplets were rare (<1.0%).  Isolated VEs were rare (<1.0%, 1371), VE Couplets were rare (<1.0%, 120), and VE Triplets were rare (<1.0%, 26). Ventricular Bigeminy was present.    Conclusion Occasional nonsustained  VT. 1 episode of paroxysmal SVT. No sustained arrhythmias. No atrial fibrillation or atrial flutter.  RHC, 07/28/2022 Successful right heart catheterization via the right antecubital vein. Severely elevated right and left-sided filling pressures, moderate pulmonary hypertension and moderately reduced cardiac output.   RA: 30 mmHg RV: 56/14 mmHg PW: 34 mmHg PA: 54/30 with a mean of 41 mmHg PA sat is 42% Cardiac output: 4.47 with a cardiac index of 2. CPO is 0.89 PAPI: 0.8  TTE, 07/25/2022  1. Left ventricular ejection fraction, by estimation, is 25 to 30%. Left ventricular ejection fraction by 2D MOD biplane is 21.6 %. The left ventricle has severely decreased function. The left  ventricle demonstrates global hypokinesis. Left ventricular diastolic parameters are indeterminate.   2. Right ventricular systolic function is mildly reduced. The right ventricular size is normal. There is moderately elevated pulmonary artery systolic pressure. The estimated right ventricular systolic pressure is 50.7 mmHg.   3. Left atrial size was mildly dilated.   4. A small pericardial effusion is present. The pericardial effusion is circumferential. There is no evidence of cardiac tamponade.   5. The aortic valve is tricuspid. Aortic valve regurgitation is not visualized. No aortic stenosis is present.   6. The mitral valve is normal in structure. Mild mitral valve regurgitation. No evidence of mitral stenosis.   7. Tricuspid valve regurgitation is moderate to severe.   TTE, 09/18/2021  1. Left ventricular ejection fraction, by estimation, is 25 to 30%. Left ventricular ejection fraction by 3D volume is 28 %. The left ventricle has severely decreased function. The left ventricle demonstrates global hypokinesis. Left ventricular  diastolic parameters are consistent with Grade II diastolic dysfunction (pseudonormalization). The average left ventricular global longitudinal strain is -5.9 %. The global longitudinal strain is abnormal.   2. Right ventricular systolic function is mildly reduced. The right ventricular size is mildly enlarged. Tricuspid regurgitation signal is inadequate for assessing PA pressure.   3. The mitral valve is normal in structure. No evidence of mitral valve regurgitation. No evidence of mitral stenosis.   4. The aortic valve is tricuspid. Aortic valve regurgitation is not visualized. No aortic stenosis is present.   5. The inferior vena cava is normal in size with greater than 50% respiratory variability, suggesting right atrial pressure of 3 mmHg.    Assessment and Plan     #) NICM #) HFrEF #) HTN LVEF remains reduced at 25-30% Narrow QRS Discussed ICD implant  procedure, previously recommended sub-Q Will message EP scheduler to identify procedure date and notify patient She will need to stop ozempic  for 1 week prior to procedure, and stop farxiga  for 3 days prior to procedure.  Recommended she proceed to pharmacy after our visit to obtain her medications Recommended she reschedule HF follow-up for ongoing mgmt.          Current medicines are reviewed at length with the patient today.   The patient does not have concerns regarding her medicines.  The following changes were made today:  none  Labs/ tests ordered today include:  No orders of the defined types were placed in this encounter.    Disposition: Follow up with Dr. Cindie or EP APP as usual post procedure   Signed, Chantal Needle, NP  06/07/23  1:45 PM  Electrophysiology [facility]

## 2023-06-07 ENCOUNTER — Other Ambulatory Visit: Payer: Self-pay

## 2023-06-07 ENCOUNTER — Encounter: Payer: Self-pay | Admitting: Cardiology

## 2023-06-07 ENCOUNTER — Ambulatory Visit: Payer: Medicaid Other | Attending: Cardiology | Admitting: Cardiology

## 2023-06-07 VITALS — BP 174/114 | HR 95 | Ht 67.0 in | Wt 219.4 lb

## 2023-06-07 DIAGNOSIS — I5022 Chronic systolic (congestive) heart failure: Secondary | ICD-10-CM

## 2023-06-07 DIAGNOSIS — I5020 Unspecified systolic (congestive) heart failure: Secondary | ICD-10-CM

## 2023-06-07 DIAGNOSIS — I428 Other cardiomyopathies: Secondary | ICD-10-CM | POA: Diagnosis not present

## 2023-06-10 ENCOUNTER — Telehealth: Payer: Self-pay | Admitting: Family

## 2023-06-10 NOTE — Telephone Encounter (Signed)
 Pt confirmed appt on 06/13/23

## 2023-06-12 NOTE — Progress Notes (Signed)
 Advanced Heart Failure Clinic Note   PCP: Rockney Cid, DO (last seen 10/24) Primary Cardiologist: Constancia Delton, MD/ Varney Gentleman, PA (last seen 11/24)  Chief Complaint: shortness of breath   HPI:  Molly Howard is a 38 y/o female with a history of DM, HTN, previous drug use, NSVT, PSVT, mod/severe TR, CKD, anemia and chronic biventricular heart failure.   Admitted 02/09/22 due to acute on chronic HF. + cocaine. Discharged after 7 days. Admitted 07/24/22 due to decompensated HFrEF. Received IV Lasix . Was on milrinone  drip. Digoxin  added. Uop about 41 liters with decrease in weight 116 kg >>91 kg. Admitted 11/05/22 due to swelling and fluid retention in arms and legs along with some central chest tightness and shortness of breath and all has been going on for about a week. Needed lasix  gtt with milrinone  and diuresing well,  total UOP >40 liters. Weight significantly down 130 kg-->97 kg. Weaned off of IV drips. Spironolactone  stopped due to hyperkalemia. Urine drug screen + for cocaine. Negative for dvt/stenosis. X-ray nothing acute to explain left arm swelling.    Admitted 04/15/23 due to worsening dyspnea with associated bilateral lower extremity edema. Had orthopnea and PND. Had not been on her diuretic because she had gotten kicked out of the house and was unable to go back for her medications. IV diuresed. Advanced HF team consulted. Venofer  given for anemia. ICD deferred due to HF exacerbation.   She presents today for a HF follow-up visit with a chief complaint of minimal shortness of breath. Has associated fatigue, occasional chest pain, palpitations and dizziness after medications along with this. Denies cough, abdominal distention, pedal edema, weight gain or difficulty sleeping. Tolerating ozempic  although does get nauseous if she eats too much or too much fried foods. ICD to be implanted 06/27/23.  Drinks ~ 32 oz soda and 32 oz water daily. No further alcohol. No drug use since  07/24. Currently living with some friends at their house and says this is much better than the [employer] she was living in previously.    Echo 10/11/20: EF 30-35% along with moderate TR.  Echo 03/20/21: EF 25-30% along with severely elevated PA pressure of 70.8 mmHg, mild LAE, mild MR and mild/moderate TR Echo 06/30/21: EF  30-35% along with mild MR. Echo 09/18/21: EF  25-30%.   Echo 07/25/22: EF 25-30% with moderately elevated PA pressure of 50.7 mmHg, mild LAE, mild MR, small pericardial effusion and moderate/ severe TR  Cardiac MRI 05/19/21:  1. Mild LV dilatation, mild hypertrophy, and mild systolic dysfunction (EF 42%)  2.  No late gadolinium enhancement to suggest myocardial scar  3.  Normal RV size and systolic function (EF 50%)  RHC 07/28/22: Severely elevated right and left-sided filling pressures, moderate pulmonary hypertension and moderately reduced cardiac output. RA: 30 mmHg RV: 56/14 mmHg PW: 34 mmHg PA: 54/30 with a mean of 41 mmHg PA sat is 42% Cardiac output: 4.47 with a cardiac index of 2. CPO is 0.89 PAPI: 0.8  Zio patch 12/2022: Patient had a min HR of 62 bpm, max HR of 187 bpm, and avg HR of 76 bpm. Predominant underlying rhythm was Sinus Rhythm. 24 Ventricular Tachycardia runs occurred, the run with the fastest interval lasting 7 beats with a max rate of 187 bpm, the longest lasting 13 beats with an avg rate of 128 bpm. 1 run of Supraventricular Tachycardia occurred lasting 6 beats with a max rate of 135 bpm (avg 116 bpm). Isolated SVEs were rare (<1.0%), SVE  Couplets were rare (<1.0%), and SVE Triplets were rare (<1.0%). Isolated VEs were rare (<1.0%, 1371), VE Couplets were rare (<1.0%, 120), and VE Triplets were rare (<1.0%, 26). Ventricular Bigeminy was present.  Conclusion Occasional nonsustained VT. 1 episode of paroxysmal SVT. No sustained arrhythmias. No atrial fibrillation or atrial flutter.  ROS: All systems negative except as listed in HPI, PMH and Problem  List.  SH:  Social History   Socioeconomic History   Marital status: Single    Spouse name: Not on file   Number of children: 0   Years of education: Not on file   Highest education level: 12th grade  Occupational History   Occupation: Diaabled  Tobacco Use   Smoking status: Former    Current packs/day: 0.00    Types: Cigarettes    Quit date: 2022    Years since quitting: 3.1   Smokeless tobacco: Never  Vaping Use   Vaping status: Never Used  Substance and Sexual Activity   Alcohol use: Not Currently   Drug use: Not Currently    Types: Cocaine    Comment: Admits to using cocaine up and will July 2024.   Sexual activity: Not Currently    Birth control/protection: None  Other Topics Concern   Not on file  Social History Narrative   Lives locally.  Does not routinely exercise.  Has been using cocaine.   Social Drivers of Corporate investment banker Strain: High Risk (04/15/2023)   Overall Financial Resource Strain (CARDIA)    Difficulty of Paying Living Expenses: Very hard  Food Insecurity: Food Insecurity Present (04/21/2023)   Hunger Vital Sign    Worried About Running Out of Food in the Last Year: Often true    Ran Out of Food in the Last Year: Often true  Transportation Needs: Unmet Transportation Needs (04/21/2023)   PRAPARE - Administrator, Civil Service (Medical): Yes    Lack of Transportation (Non-Medical): No  Physical Activity: Not on file  Stress: Not on file  Social Connections: Not on file  Intimate Partner Violence: Not At Risk (04/21/2023)   Humiliation, Afraid, Rape, and Kick questionnaire    Fear of Current or Ex-Partner: No    Emotionally Abused: No    Physically Abused: No    Sexually Abused: No    FH:  Family History  Problem Relation Age of Onset   Heart failure Mother        Onset of heart failure 40s.  Died in 26-Apr-2022.   Diabetes Mother    Hypertension Father    Diabetes Father     Past Medical History:   Diagnosis Date   Acid reflux    Chronic HFrEF (heart failure with reduced ejection fraction) (HCC)    a. 10/2019 Echo: EF 35-40%, GrII DD; b. 05/2020 Echo: EF 20-25%, glob HK; c. 10/2020 Echo: EF 30-35%, glob HK. GrII DD, Mildly red RV fxn. Mod TR; d.  05/2021 cMRI: EF 42%, no LGE. Nl RV size/fxn.   CKD (chronic kidney disease), stage II    Diabetes mellitus (HCC)    H/O medication noncompliance    Hypertension    Microcytic anemia    NICM (nonischemic cardiomyopathy) (HCC)    a. 10/2019 Echo: EF 35-40%; b. 10/2019 MV: No ischemia. Small apical defect-->breast attenuation; c. 05/2020 Echo: EF 20-25%; d. 10/2020 Echo: EF 30-35%, glob HK. GrII DD, Mildly red RV fxn. Mod TR; e. 05/2021 cMRI: EF 42%, no LGE. Nl RV size/fxn.  Obesity    Polysubstance abuse (HCC)     Current Outpatient Medications  Medication Sig Dispense Refill   Accu-Chek Softclix Lancets lancets Use as instructed (Patient not taking: Reported on 06/07/2023) 100 each 12   carvedilol  (COREG ) 12.5 MG tablet Take 1 tablet (12.5 mg total) by mouth 2 (two) times daily with a meal. 60 tablet 1   Continuous Glucose Sensor (FREESTYLE LIBRE 3 SENSOR) MISC Place 1 sensor on the skin every 14 days. Use to check glucose continuously (Patient not taking: Reported on 06/07/2023) 2 each 12   dapagliflozin  propanediol (FARXIGA ) 10 MG TABS tablet Take 1 tablet (10 mg total) by mouth once daily. 30 tablet 1   digoxin  (LANOXIN ) 0.125 MG tablet Take 0.5 tablets (0.0625 mg total) by mouth daily. 30 tablet 3   sacubitril -valsartan  (ENTRESTO ) 97-103 MG Take 1 tablet by mouth 2 (two) times daily. 60 tablet 1   Semaglutide ,0.25 or 0.5MG /DOS, (OZEMPIC , 0.25 OR 0.5 MG/DOSE,) 2 MG/3ML SOPN Inject 0.5 mg into the skin once a week. 3 mL 1   spironolactone  (ALDACTONE ) 25 MG tablet Take 1 tablet (25 mg total) by mouth daily. 30 tablet 1   torsemide  (DEMADEX ) 20 MG tablet Take 2 tablets (40 mg total) by mouth daily 60 tablet 1   No current facility-administered  medications for this visit.   Vitals:   06/13/23 1332  BP: 125/81  Pulse: 73  SpO2: 99%  Weight: 211 lb (95.7 kg)   Wt Readings from Last 3 Encounters:  06/13/23 211 lb (95.7 kg)  06/07/23 219 lb 6 oz (99.5 kg)  05/16/23 215 lb (97.5 kg)   Lab Results  Component Value Date   CREATININE 1.10 (H) 05/16/2023   CREATININE 1.55 (H) 04/25/2023   CREATININE 0.96 04/20/2023    PHYSICAL EXAM:  General: Well appearing. No resp difficulty HEENT: normal Neck: supple, no JVD Cor: Regular rhythm, rate. No rubs, gallops, II/VI systolic murmur LSB Lungs: clear Abdomen: soft, nontender, nondistended. Extremities: no cyanosis, clubbing, rash, edema Neuro: alert & oriented X 3. Moves all 4 extremities w/o difficulty. Affect pleasant   ECG: not done   ASSESSMENT & PLAN:  1: NICM with reduced ejection fraction- - suspect due to previous cocaine use and uncontrolled HTN  - NYHA class II - euvolemic - weighing daily; reminded to call for an overnight weight gain of > 2 pounds or a weekly weight gain of > 5 pounds - weight down 4 pounds from last visit here 1 month ago - Echo 06/30/21: EF  30-35% along with mild MR. - Echo 09/18/21: EF  25-30%.   - Echo 07/25/22: EF 25-30% with moderately elevated PA pressure of 50.7 mmHg, mild LAE, mild MR, small pericardial effusion and moderate/ severe TR - Cardiac MRI 05/19/21:    1. Mild LV dilatation, mild hypertrophy, and mild systolic   dysfunction (EF 42%)    2. No late gadolinium enhancement to suggest myocardial scar   3. Normal RV size and systolic function (EF 50%) - saw cardiology (Dunn) 11/24 - saw EP (Riddle) 02/25 - ICD to be implanted later this month - continue carvedilol  12.5mg  BID - continue farxiga  10mg  daily - continue digoxin  0.0625mg  daily; dig level 04/20/23 was 0.3 - continue entresto  97/103mg  BID  - continue spironolatone 25mg  daily - continue torsemide  40mg  daily - not a candidate for advanced therapies due to repeated +  drug screens - BNP 04/14/23 was 1519.4  2: HTN- - BP 125/81 - saw PCP Jefm Minium) 10/24 - BMP 05/16/23  reviewed and showed sodium 140, potassium 3.9, creatinine 1.10 & GFR >60 - to get labs drawn today for upcoming ICD implantation  3: DM- - A1c 02/17/23 was 7.7% - continue ozempic  0.25mg  weekly   4: Anemia- - Hg 04/18/23 showed hemoglobin 8.6  5: Substance use- - no tobacco - no drug use since 07/24 - denies any recent alcohol use  Return in 3 months, sooner if needed.   Charlette Console, FNP 06/12/23

## 2023-06-13 ENCOUNTER — Encounter: Payer: Self-pay | Admitting: Family

## 2023-06-13 ENCOUNTER — Ambulatory Visit: Payer: Medicaid Other | Attending: Family | Admitting: Family

## 2023-06-13 VITALS — BP 125/81 | HR 73 | Wt 211.0 lb

## 2023-06-13 DIAGNOSIS — Z87891 Personal history of nicotine dependence: Secondary | ICD-10-CM | POA: Insufficient documentation

## 2023-06-13 DIAGNOSIS — D631 Anemia in chronic kidney disease: Secondary | ICD-10-CM | POA: Insufficient documentation

## 2023-06-13 DIAGNOSIS — I130 Hypertensive heart and chronic kidney disease with heart failure and stage 1 through stage 4 chronic kidney disease, or unspecified chronic kidney disease: Secondary | ICD-10-CM | POA: Diagnosis not present

## 2023-06-13 DIAGNOSIS — I5020 Unspecified systolic (congestive) heart failure: Secondary | ICD-10-CM

## 2023-06-13 DIAGNOSIS — E1129 Type 2 diabetes mellitus with other diabetic kidney complication: Secondary | ICD-10-CM | POA: Diagnosis not present

## 2023-06-13 DIAGNOSIS — Z79899 Other long term (current) drug therapy: Secondary | ICD-10-CM | POA: Diagnosis not present

## 2023-06-13 DIAGNOSIS — N182 Chronic kidney disease, stage 2 (mild): Secondary | ICD-10-CM | POA: Insufficient documentation

## 2023-06-13 DIAGNOSIS — I5022 Chronic systolic (congestive) heart failure: Secondary | ICD-10-CM | POA: Diagnosis not present

## 2023-06-13 DIAGNOSIS — I472 Ventricular tachycardia, unspecified: Secondary | ICD-10-CM | POA: Insufficient documentation

## 2023-06-13 DIAGNOSIS — R0602 Shortness of breath: Secondary | ICD-10-CM | POA: Insufficient documentation

## 2023-06-13 DIAGNOSIS — I10 Essential (primary) hypertension: Secondary | ICD-10-CM

## 2023-06-13 DIAGNOSIS — D509 Iron deficiency anemia, unspecified: Secondary | ICD-10-CM

## 2023-06-13 DIAGNOSIS — E1122 Type 2 diabetes mellitus with diabetic chronic kidney disease: Secondary | ICD-10-CM | POA: Diagnosis not present

## 2023-06-13 DIAGNOSIS — I428 Other cardiomyopathies: Secondary | ICD-10-CM | POA: Diagnosis not present

## 2023-06-13 DIAGNOSIS — I5082 Biventricular heart failure: Secondary | ICD-10-CM | POA: Insufficient documentation

## 2023-06-13 DIAGNOSIS — Z7984 Long term (current) use of oral hypoglycemic drugs: Secondary | ICD-10-CM | POA: Diagnosis not present

## 2023-06-13 DIAGNOSIS — Z7985 Long-term (current) use of injectable non-insulin antidiabetic drugs: Secondary | ICD-10-CM | POA: Diagnosis not present

## 2023-06-13 DIAGNOSIS — F1910 Other psychoactive substance abuse, uncomplicated: Secondary | ICD-10-CM

## 2023-06-13 DIAGNOSIS — I3139 Other pericardial effusion (noninflammatory): Secondary | ICD-10-CM | POA: Diagnosis not present

## 2023-06-13 NOTE — Patient Instructions (Signed)
Good to see you today!  

## 2023-06-14 LAB — CBC WITH DIFFERENTIAL/PLATELET
Basophils Absolute: 0.1 10*3/uL (ref 0.0–0.2)
Basos: 2 %
EOS (ABSOLUTE): 0.4 10*3/uL (ref 0.0–0.4)
Eos: 6 %
Hematocrit: 35.6 % (ref 34.0–46.6)
Hemoglobin: 11.5 g/dL (ref 11.1–15.9)
Immature Grans (Abs): 0 10*3/uL (ref 0.0–0.1)
Immature Granulocytes: 1 %
Lymphocytes Absolute: 1.1 10*3/uL (ref 0.7–3.1)
Lymphs: 18 %
MCH: 30.3 pg (ref 26.6–33.0)
MCHC: 32.3 g/dL (ref 31.5–35.7)
MCV: 94 fL (ref 79–97)
Monocytes Absolute: 0.5 10*3/uL (ref 0.1–0.9)
Monocytes: 8 %
Neutrophils Absolute: 3.9 10*3/uL (ref 1.4–7.0)
Neutrophils: 65 %
Platelets: 448 10*3/uL (ref 150–450)
RBC: 3.8 x10E6/uL (ref 3.77–5.28)
RDW: 13.4 % (ref 11.7–15.4)
WBC: 6 10*3/uL (ref 3.4–10.8)

## 2023-06-14 LAB — BASIC METABOLIC PANEL
BUN/Creatinine Ratio: 13 (ref 9–23)
BUN: 15 mg/dL (ref 6–20)
CO2: 27 mmol/L (ref 20–29)
Calcium: 7.4 mg/dL — ABNORMAL LOW (ref 8.7–10.2)
Chloride: 100 mmol/L (ref 96–106)
Creatinine, Ser: 1.2 mg/dL — ABNORMAL HIGH (ref 0.57–1.00)
Glucose: 127 mg/dL — ABNORMAL HIGH (ref 70–99)
Potassium: 4.4 mmol/L (ref 3.5–5.2)
Sodium: 142 mmol/L (ref 134–144)
eGFR: 59 mL/min/{1.73_m2} — ABNORMAL LOW (ref >59)

## 2023-06-19 NOTE — Pre-Procedure Instructions (Signed)
Attempted to call patient regarding procedure on 2/24 with anesthesia.  Unable to get call to go thru, will try again.

## 2023-06-24 ENCOUNTER — Encounter: Payer: Medicaid Other | Admitting: Family

## 2023-06-24 NOTE — Pre-Procedure Instructions (Signed)
 Instructed patient on the following items: Arrival time 1000 Nothing to eat or drink after midnight No meds AM of procedure Responsible person to drive you home and stay with you for 24 hrs

## 2023-06-26 NOTE — Anesthesia Preprocedure Evaluation (Signed)
 Anesthesia Evaluation  Patient identified by MRN, date of birth, ID band Patient awake    Reviewed: Allergy & Precautions, NPO status , Patient's Chart, lab work & pertinent test results  Airway Mallampati: III  TM Distance: >3 FB Neck ROM: Full    Dental  (+) Dental Advisory Given, Poor Dentition, Missing, Chipped, Partial Lower, Upper Dentures   Pulmonary shortness of breath, former smoker   Pulmonary exam normal breath sounds clear to auscultation       Cardiovascular hypertension, Pt. on home beta blockers and Pt. on medications pulmonary hypertension+CHF  Normal cardiovascular exam+ Valvular Problems/Murmurs (Mod-Severe TR) MR  Rhythm:Regular Rate:Normal  Echo 07/2022  1. Left ventricular ejection fraction, by estimation, is 25 to 30%. Left ventricular ejection fraction by 2D MOD biplane is 21.6 %. The left ventricle has severely decreased function. The left ventricle demonstrates global hypokinesis. Left ventricular diastolic parameters are indeterminate.   2. Right ventricular systolic function is mildly reduced. The right ventricular size is normal. There is moderately elevated pulmonary artery systolic pressure. The estimated right ventricular systolic pressure is 50.7 mmHg.   3. Left atrial size was mildly dilated.   4. A small pericardial effusion is present. The pericardial effusion is circumferential. There is no evidence of cardiac tamponade.   5. The aortic valve is tricuspid. Aortic valve regurgitation is not visualized. No aortic stenosis is present.   6. The mitral valve is normal in structure. Mild mitral valve regurgitation. No evidence of mitral stenosis.   7. Tricuspid valve regurgitation is moderate to severe.    RHC 07/2022 Severely elevated right and left-sided filling pressures, moderate pulmonary hypertension and moderately reduced cardiac output.   RA: 30 mmHg RV: 56/14 mmHg PW: 34 mmHg PA: 54/30 with a  mean of 41 mmHg PA sat is 42% Cardiac output: 4.47 with a cardiac index of 2. CPO is 0.89 PAPI: 0.8   Recommendations: The patient is severely volume overloaded with evidence of biventricular failure right more than left by hemodynamics. Recommend starting milrinone drip and furosemide drip.  Will transfer to stepdown unit.     Neuro/Psych negative neurological ROS     GI/Hepatic Neg liver ROS,GERD  Controlled,,  Endo/Other  diabetes  Class 3 obesity  Renal/GU Renal disease     Musculoskeletal negative musculoskeletal ROS (+)    Abdominal   Peds  Hematology  (+) Blood dyscrasia, anemia   Anesthesia Other Findings   Reproductive/Obstetrics                             Anesthesia Physical Anesthesia Plan  ASA: 4  Anesthesia Plan: General   Post-op Pain Management: Tylenol PO (pre-op)* and Gabapentin PO (pre-op)*   Induction: Intravenous  PONV Risk Score and Plan: Ondansetron, Dexamethasone and Treatment may vary due to age or medical condition  Airway Management Planned: Oral ETT  Additional Equipment: Arterial line and ClearSight  Intra-op Plan:   Post-operative Plan: Extubation in OR  Informed Consent: I have reviewed the patients History and Physical, chart, labs and discussed the procedure including the risks, benefits and alternatives for the proposed anesthesia with the patient or authorized representative who has indicated his/her understanding and acceptance.     Dental advisory given  Plan Discussed with: CRNA  Anesthesia Plan Comments:         Anesthesia Quick Evaluation

## 2023-06-27 ENCOUNTER — Other Ambulatory Visit: Payer: Self-pay

## 2023-06-27 ENCOUNTER — Encounter (HOSPITAL_COMMUNITY)
Admission: RE | Discharge status: Against Medical Advice | Payer: Self-pay | Source: Ambulatory Visit | Attending: Cardiology

## 2023-06-27 ENCOUNTER — Ambulatory Visit (HOSPITAL_BASED_OUTPATIENT_CLINIC_OR_DEPARTMENT_OTHER): Payer: Self-pay | Admitting: Anesthesiology

## 2023-06-27 ENCOUNTER — Encounter (HOSPITAL_COMMUNITY): Payer: Self-pay | Admitting: Cardiology

## 2023-06-27 ENCOUNTER — Ambulatory Visit (HOSPITAL_COMMUNITY): Payer: Self-pay | Admitting: Anesthesiology

## 2023-06-27 ENCOUNTER — Observation Stay (HOSPITAL_COMMUNITY)
Admission: RE | Admit: 2023-06-27 | Discharge: 2023-06-28 | Discharge status: Against Medical Advice | Payer: Medicaid Other | Source: Ambulatory Visit | Attending: Cardiology | Admitting: Cardiology

## 2023-06-27 DIAGNOSIS — I130 Hypertensive heart and chronic kidney disease with heart failure and stage 1 through stage 4 chronic kidney disease, or unspecified chronic kidney disease: Secondary | ICD-10-CM

## 2023-06-27 DIAGNOSIS — I5042 Chronic combined systolic (congestive) and diastolic (congestive) heart failure: Principal | ICD-10-CM | POA: Insufficient documentation

## 2023-06-27 DIAGNOSIS — N182 Chronic kidney disease, stage 2 (mild): Secondary | ICD-10-CM

## 2023-06-27 DIAGNOSIS — F1910 Other psychoactive substance abuse, uncomplicated: Secondary | ICD-10-CM | POA: Insufficient documentation

## 2023-06-27 DIAGNOSIS — Z95810 Presence of automatic (implantable) cardiac defibrillator: Principal | ICD-10-CM | POA: Diagnosis present

## 2023-06-27 DIAGNOSIS — Z87891 Personal history of nicotine dependence: Secondary | ICD-10-CM

## 2023-06-27 DIAGNOSIS — I428 Other cardiomyopathies: Secondary | ICD-10-CM | POA: Insufficient documentation

## 2023-06-27 DIAGNOSIS — I429 Cardiomyopathy, unspecified: Secondary | ICD-10-CM | POA: Insufficient documentation

## 2023-06-27 DIAGNOSIS — D649 Anemia, unspecified: Secondary | ICD-10-CM | POA: Diagnosis not present

## 2023-06-27 DIAGNOSIS — I5022 Chronic systolic (congestive) heart failure: Secondary | ICD-10-CM | POA: Diagnosis not present

## 2023-06-27 HISTORY — PX: SUBQ ICD IMPLANT: EP1223

## 2023-06-27 LAB — GLUCOSE, CAPILLARY
Glucose-Capillary: 147 mg/dL — ABNORMAL HIGH (ref 70–99)
Glucose-Capillary: 125 mg/dL — ABNORMAL HIGH (ref 70–99)

## 2023-06-27 LAB — PREGNANCY, URINE: Preg Test, Ur: NEGATIVE

## 2023-06-27 SURGERY — SUBQ ICD IMPLANT
Anesthesia: General

## 2023-06-27 MED ORDER — SACUBITRIL-VALSARTAN 97-103 MG PO TABS
1.0000 | ORAL_TABLET | Freq: Two times a day (BID) | ORAL | Status: DC
  Administered 2023-06-28: 1 via ORAL
  Filled 2023-06-27: qty 1

## 2023-06-27 MED ORDER — POVIDONE-IODINE 10 % EX SWAB
2.0000 | Freq: Once | CUTANEOUS | Status: AC
  Administered 2023-06-27: 2 via TOPICAL

## 2023-06-27 MED ORDER — SODIUM CHLORIDE 0.9 % IV SOLN
INTRAVENOUS | Status: AC
  Filled 2023-06-27: qty 2

## 2023-06-27 MED ORDER — OXYCODONE HCL 5 MG/5ML PO SOLN: 5.0000 mg | Freq: Once | ORAL | Status: DC | PRN

## 2023-06-27 MED ORDER — ONDANSETRON HCL 4 MG/2ML IJ SOLN: 4.0000 mg | Freq: Four times a day (QID) | INTRAMUSCULAR | Status: DC | PRN

## 2023-06-27 MED ORDER — ACETAMINOPHEN 325 MG PO TABS
325.0000 mg | ORAL_TABLET | ORAL | Status: DC | PRN
  Administered 2023-06-27 – 2023-06-28 (×2): 650 mg via ORAL
  Filled 2023-06-27 (×2): qty 2

## 2023-06-27 MED ORDER — HEPARIN (PORCINE) IN NACL 1000-0.9 UT/500ML-% IV SOLN
INTRAVENOUS | Status: DC | PRN
  Administered 2023-06-27: 500 mL

## 2023-06-27 MED ORDER — HYDROMORPHONE HCL 1 MG/ML IJ SOLN
0.2500 mg | INTRAMUSCULAR | Status: DC | PRN
  Administered 2023-06-27: 0.5 mg via INTRAVENOUS

## 2023-06-27 MED ORDER — DIGOXIN 0.0625 MG HALF TABLET
0.0625 mg | ORAL_TABLET | Freq: Every day | ORAL | Status: DC
Start: 2023-06-28 — End: 2023-06-28
  Administered 2023-06-28: 0.0625 mg via ORAL
  Filled 2023-06-27: qty 1

## 2023-06-27 MED ORDER — MIDAZOLAM HCL 2 MG/2ML IJ SOLN
INTRAMUSCULAR | Status: AC
  Filled 2023-06-27: qty 2

## 2023-06-27 MED ORDER — FENTANYL CITRATE (PF) 100 MCG/2ML IJ SOLN
INTRAMUSCULAR | Status: AC
Start: 2023-06-27 — End: 2023-06-27
  Filled 2023-06-27: qty 2

## 2023-06-27 MED ORDER — FENTANYL CITRATE (PF) 250 MCG/5ML IJ SOLN
INTRAMUSCULAR | Status: DC | PRN
Start: 2023-06-27 — End: 2023-06-27
  Administered 2023-06-27: 100 ug via INTRAVENOUS

## 2023-06-27 MED ORDER — SODIUM CHLORIDE 0.9 % IV SOLN
80.0000 mg | INTRAVENOUS | Status: AC
  Administered 2023-06-27: 80 mg

## 2023-06-27 MED ORDER — ACETAMINOPHEN 500 MG PO TABS
1000.0000 mg | ORAL_TABLET | Freq: Once | ORAL | Status: AC
  Administered 2023-06-27: 1000 mg via ORAL

## 2023-06-27 MED ORDER — CHLORHEXIDINE GLUCONATE 4 % EX SOLN
4.0000 | Freq: Once | CUTANEOUS | Status: AC
  Administered 2023-06-27: 4 via TOPICAL
  Filled 2023-06-27: qty 60

## 2023-06-27 MED ORDER — DAPAGLIFLOZIN PROPANEDIOL 10 MG PO TABS
10.0000 mg | ORAL_TABLET | Freq: Every day | ORAL | Status: DC
  Administered 2023-06-28: 10 mg via ORAL
  Filled 2023-06-27: qty 1

## 2023-06-27 MED ORDER — PROPOFOL 10 MG/ML IV BOLUS
INTRAVENOUS | Status: DC | PRN
  Administered 2023-06-27: 20 mg via INTRAVENOUS
  Administered 2023-06-27: 100 mg via INTRAVENOUS

## 2023-06-27 MED ORDER — SUGAMMADEX SODIUM 200 MG/2ML IV SOLN
INTRAVENOUS | Status: DC | PRN
  Administered 2023-06-27: 235 mg via INTRAVENOUS

## 2023-06-27 MED ORDER — CARVEDILOL 12.5 MG PO TABS
12.5000 mg | ORAL_TABLET | Freq: Two times a day (BID) | ORAL | Status: DC
  Administered 2023-06-27 – 2023-06-28 (×3): 12.5 mg via ORAL
  Filled 2023-06-27 (×3): qty 1

## 2023-06-27 MED ORDER — PHENYLEPHRINE 80 MCG/ML (10ML) SYRINGE FOR IV PUSH (FOR BLOOD PRESSURE SUPPORT)
PREFILLED_SYRINGE | INTRAVENOUS | Status: DC | PRN
Start: 2023-06-27 — End: 2023-06-27
  Administered 2023-06-27 (×2): 80 ug via INTRAVENOUS
  Administered 2023-06-27: 160 ug via INTRAVENOUS
  Administered 2023-06-27 (×4): 80 ug via INTRAVENOUS

## 2023-06-27 MED ORDER — MIDAZOLAM HCL 2 MG/2ML IJ SOLN
INTRAMUSCULAR | Status: DC | PRN
  Administered 2023-06-27 (×2): 1 mg via INTRAVENOUS

## 2023-06-27 MED ORDER — LIDOCAINE 2% (20 MG/ML) 5 ML SYRINGE
INTRAMUSCULAR | Status: DC | PRN
  Administered 2023-06-27: 80 mg via INTRAVENOUS

## 2023-06-27 MED ORDER — CEFAZOLIN SODIUM-DEXTROSE 2-4 GM/100ML-% IV SOLN
INTRAVENOUS | Status: AC
Start: 2023-06-27 — End: 2023-06-27
  Filled 2023-06-27: qty 100

## 2023-06-27 MED ORDER — BUPIVACAINE HCL (PF) 0.25 % IJ SOLN
INTRAMUSCULAR | Status: AC
  Filled 2023-06-27: qty 30

## 2023-06-27 MED ORDER — ACETAMINOPHEN 500 MG PO TABS
ORAL_TABLET | ORAL | Status: AC
  Filled 2023-06-27: qty 2

## 2023-06-27 MED ORDER — ONDANSETRON HCL 4 MG/2ML IJ SOLN
INTRAMUSCULAR | Status: DC | PRN
  Administered 2023-06-27: 4 mg via INTRAVENOUS

## 2023-06-27 MED ORDER — CEFAZOLIN SODIUM-DEXTROSE 2-4 GM/100ML-% IV SOLN
2.0000 g | INTRAVENOUS | Status: AC
  Administered 2023-06-27: 2 g via INTRAVENOUS

## 2023-06-27 MED ORDER — ROCURONIUM BROMIDE 10 MG/ML (PF) SYRINGE
PREFILLED_SYRINGE | INTRAVENOUS | Status: DC | PRN
  Administered 2023-06-27: 60 mg via INTRAVENOUS

## 2023-06-27 MED ORDER — OXYCODONE HCL 5 MG PO TABS: 5.0000 mg | ORAL_TABLET | Freq: Once | ORAL | Status: DC | PRN

## 2023-06-27 MED ORDER — HYDROMORPHONE HCL 1 MG/ML IJ SOLN
INTRAMUSCULAR | Status: AC
Start: 2023-06-27 — End: 2023-06-28
  Filled 2023-06-27: qty 0.5

## 2023-06-27 MED ORDER — BUPIVACAINE HCL (PF) 0.25 % IJ SOLN
INTRAMUSCULAR | Status: DC | PRN
  Administered 2023-06-27: 30 mL

## 2023-06-27 MED ORDER — SODIUM CHLORIDE 0.9 % IV SOLN: INTRAVENOUS | Status: DC

## 2023-06-27 SURGICAL SUPPLY — 5 items
ICD SUBCU MRI EMBLEM A219 (ICD Generator) IMPLANT
LEAD SUBQU EMBLEM 3501 (Pacemaker) IMPLANT
MAT PREVALON FULL STRYKER (MISCELLANEOUS) IMPLANT
PAD DEFIB RADIO PHYSIO CONN (PAD) ×1 IMPLANT
TRAY PACEMAKER INSERTION (PACKS) ×1 IMPLANT

## 2023-06-27 NOTE — Anesthesia Postprocedure Evaluation (Signed)
 Anesthesia Post Note  Patient: Molly Howard  Procedure(s) Performed: SUBQ ICD IMPLANT     Patient location during evaluation: PACU Anesthesia Type: General Level of consciousness: sedated and patient cooperative Pain management: pain level controlled Vital Signs Assessment: post-procedure vital signs reviewed and stable Respiratory status: spontaneous breathing Cardiovascular status: stable Anesthetic complications: no  No notable events documented.  Last Vitals:  Vitals:   06/27/23 1630 06/27/23 1711  BP: (!) 155/89 (!) 159/92  Pulse: 79   Resp: (!) 23 17  Temp:  36.9 C  SpO2: 99%     Last Pain:  Vitals:   06/27/23 1711  TempSrc: Oral  PainSc:                  Lewie Loron

## 2023-06-27 NOTE — Plan of Care (Signed)

## 2023-06-27 NOTE — Anesthesia Procedure Notes (Signed)
 Procedure Name: Intubation Date/Time: 06/27/2023 1:15 PM  Performed by: Thomasene Ripple, CRNAPre-anesthesia Checklist: Patient identified, Emergency Drugs available, Suction available and Patient being monitored Patient Re-evaluated:Patient Re-evaluated prior to induction Oxygen Delivery Method: Circle System Utilized Preoxygenation: Pre-oxygenation with 100% oxygen Induction Type: IV induction Ventilation: Mask ventilation without difficulty and Oral airway inserted - appropriate to patient size Laryngoscope Size: Glidescope and 3 Grade View: Grade I Tube type: Oral Tube size: 7.0 mm Number of attempts: 1 Airway Equipment and Method: Stylet and Oral airway Placement Confirmation: ETT inserted through vocal cords under direct vision, positive ETCO2 and breath sounds checked- equal and bilateral Secured at: 21 cm Tube secured with: Tape Dental Injury: Teeth and Oropharynx as per pre-operative assessment

## 2023-06-27 NOTE — H&P (Signed)
 Electrophysiology Office Follow up Visit Note:     Date:  06/27/2023    ID:  Molly Howard, DOB 31-Jul-1985, MRN [id number]   PCP:  Margarita Mail, DO            [facility] Cardiologist:  Debbe Odea, MD  [facility] Electrophysiologist:  Lanier Prude, MD      Interval History:       Molly Howard is a 38 y.o. female who presents for a follow up visit.    Discussed the use of AI scribe software for clinical note transcription with the patient, who gave verbal consent to proceed.   History of Present Illness   Molly Howard, a patient with non-ischemic cardiomyopathy and chronic systolic heart failure, presents for follow-up. Her ejection fraction (EF) has been fluctuating, with a recent echocardiogram in March showing an EF of 25-30%. She has been hospitalized multiple times in the past year for decompensated heart failure. A subcutaneous ICD was planned but has been postponed twice due to transient improvement in EF and anemia.   Recently, she has been experiencing changes in her heart rate, which she describes as going fast and then slowing down. This symptom has worsened recently, leading to an increase in her medication. She also reports fluid retention, particularly in her abdomen, and has to prop herself up when sleeping due to shortness of breath. She denies any episodes of syncope but mentions staggering when she walks, which occurs all the time. She has not been thinking about the defibrillator recently and denies any current cocaine use.          Presents for S-ICD implant. Procedure reviewed.    Objective Past medical, surgical, social and family history were reviewed.   ROS:   Please see the history of present illness.    All other systems reviewed and are negative.   EKGs/Labs/Other Studies Reviewed:     The following studies were reviewed today:         Physical Exam:     VS:  BP 149/94 (BP Location: Left Arm, Patient Position: Sitting, Cuff  Size: Large)   Pulse 67   Ht 5\' 7"  (1.702 m)   Wt 205 lb 3.2 oz (93.1 kg)   SpO2 99%   BMI 32.14 kg/m         Wt Readings from Last 3 Encounters:  03/16/23 205 lb 3.2 oz (93.1 kg)  03/10/23 207 lb (93.9 kg)  03/02/23 202 lb 11.2 oz (91.9 kg)      Physical Exam   CHEST: lungs clear to auscultation CARDIOVASCULAR: regular rhythm, no murmurs EXTREMITIES: no edema in the lower extremities         Assessment ASSESSMENT:     1. Chronic combined systolic and diastolic heart failure (HCC)   2. Nonischemic cardiomyopathy (HCC)   3. Polysubstance abuse (HCC)     PLAN:     In order of problems listed above:   Assessment and Plan    Chronic Systolic Heart Failure EF 25-30% with mildly reduced right ventricular function. Multiple hospitalizations for decompensated heart failure in 2024. Patient reports palpitations, fluid retention in the abdomen, and shortness of breath requiring propping up at night. No syncope. -Plan for subcutaneous ICD implantation in the next few weeks under general anesthesia. Procedure discussed including the risks and recovery and she wishes to proceed.  -Continue current medications.   Anemia Previously led to cancellation of ICD implant, now improved.   Cocaine Use Patient reports  abstinence. -Continue abstinence for cardiac health.         The patient has a nonischemic CM (EF 25%), NYHA Class III CHF, and CAD.  He is referred by Dr Azucena Cecil for risk stratification of sudden death and consideration of ICD implantation.  At this time, she meets criteria for ICD implantation for primary prevention of sudden death.  I have had a thorough discussion with the patient reviewing options.  The patient and their family (if available) have had opportunities to ask questions and have them answered. The patient and I have decided together through a shared decision making process to proceed with ICD implant at this time.     Risks, benefits, alternatives to ICD  implantation were discussed in detail with the patient today. The patient understands that the risks include but are not limited to bleeding, infection, pneumothorax, perforation, tamponade, vascular damage, renal failure, MI, stroke, death, inappropriate shocks, and lead dislodgement and wishes to proceed.  We will therefore schedule device implantation at the next available time.   Plan for subcutaneous ICD.        Presents for S-ICD today. Procedure reviewed.     Signed, Steffanie Dunn, MD, [facility], [facility] 06/27/2023 Electrophysiology [facility]

## 2023-06-27 NOTE — Transfer of Care (Signed)
 Immediate Anesthesia Transfer of Care Note  Patient: Molly Howard  Procedure(s) Performed: SUBQ ICD IMPLANT  Patient Location: PACU  Anesthesia Type:General  Level of Consciousness: awake, alert , and oriented  Airway & Oxygen Therapy: Patient Spontanous Breathing and Patient connected to face mask oxygen  Post-op Assessment: Report given to RN and Post -op Vital signs reviewed and stable  Post vital signs: Reviewed and stable  Last Vitals:  Vitals Value Taken Time  BP 162/89 06/27/23 1506  Temp 35.5 C 06/27/23 1503  Pulse 73 06/27/23 1508  Resp 24 06/27/23 1508  SpO2 100 % 06/27/23 1508  Vitals shown include unfiled device data.  Last Pain:  Vitals:   06/27/23 1503  TempSrc: Temporal  PainSc: 9          Complications: No notable events documented.

## 2023-06-28 ENCOUNTER — Telehealth: Payer: Self-pay

## 2023-06-28 ENCOUNTER — Ambulatory Visit (HOSPITAL_COMMUNITY): Payer: 59

## 2023-06-28 ENCOUNTER — Encounter (HOSPITAL_COMMUNITY): Payer: Self-pay | Admitting: Cardiology

## 2023-06-28 DIAGNOSIS — Z95810 Presence of automatic (implantable) cardiac defibrillator: Secondary | ICD-10-CM

## 2023-06-28 DIAGNOSIS — I5042 Chronic combined systolic (congestive) and diastolic (congestive) heart failure: Secondary | ICD-10-CM | POA: Diagnosis not present

## 2023-06-28 MED ORDER — TRAMADOL HCL 50 MG PO TABS
50.0000 mg | ORAL_TABLET | Freq: Four times a day (QID) | ORAL | Status: DC | PRN
  Administered 2023-06-28 (×3): 50 mg via ORAL
  Filled 2023-06-28 (×4): qty 1

## 2023-06-28 NOTE — Progress Notes (Signed)
  Called by industry with inappropriate therapy by her device due to OVERSENSING.   Unclear what precipitated the change in amplitude, with no pause or tachy on telemetry preceding. Discussion with industry describes possibility of air in the wound as a confounding factor.   Adjustments made to Secondary Vector on her device with 4.5s detection interval delay.   Will observe overnight again.   Will also order nocturnal oximetry to evaluate for sleep apnea, which she has been told that she has.   Casimiro Needle [address] [place], [place]  06/28/2023 8:58 AM

## 2023-06-28 NOTE — Telephone Encounter (Signed)
 Follow-up after same day discharge: Implant date: 06/27/2023 MD: CL Device: ICD BSX Location: L chest   Wound check visit: yes 90 day MD follow-up: yes  Remote Transmission received:not yet   Dressing/sling removed: n/a  Confirm OAC restart on: n/a  Please continue to monitor your cardiac device site for redness, swelling, and drainage. Call the device clinic at [phone] if you experience these symptoms, fever/chills, or have questions about your device.   Remote monitoring is used to monitor your cardiac device from home. This monitoring is scheduled every 91 days by our office. It allows [place] to keep an eye on the functioning of your device to ensure it is working properly.   LVM for pt to call back pt is still admitted in hospital

## 2023-06-28 NOTE — Progress Notes (Signed)
  Patient Name: Molly Howard Date of Encounter: 06/28/2023  Primary Cardiologist: Debbe Odea, MD Electrophysiologist: Lanier Prude, MD  Interval Summary   Noted to have ICD shock overnight, in setting of oversensing.   Industry in to review. Pt overall feeling OK. Reports "sleep apnea" but no prior sleep study or CPAP.   Vital Signs    Vitals:   06/28/23 0006 06/28/23 0356 06/28/23 0823 06/28/23 1207  BP: 134/76 (!) 145/89 (!) 155/93 125/78  Pulse: 71 75 73 71  Resp: 16 16  18   Temp: 98.8 F (37.1 C) 98.5 F (36.9 C) 98.5 F (36.9 C) 98 F (36.7 C)  TempSrc: Oral Oral Oral Oral  SpO2:   99% 99%  Weight:      Height:        Intake/Output Summary (Last 24 hours) at 06/28/2023 1459 Last data filed at 06/28/2023 1208 Gross per 24 hour  Intake 354 ml  Output --  Net 354 ml   Filed Weights   06/27/23 1007  Weight: 113.4 kg    Physical Exam    GEN- The patient is well appearing, alert and oriented x 3 today.   Lungs- Clear to ausculation bilaterally, normal work of breathing Cardiac- Regular rate and rhythm, no murmurs, rubs or gallops GI- soft, NT, ND, + BS Extremities- no clubbing or cyanosis. No edema  Telemetry    NSR 70s, ICD shock artifact occurred around 0419 overnight (personally reviewed)  Hospital Course    Jacques L Fielding is a 38 y.o. female admitted for planned S-ICD. Planned for discharge after overnight observation but had inappropriate ICD Shock, felt likely due to "air bubble".  Assessment & Plan    Chronic systolic CHF S/p S-ICD Wound stable. Outer dressings will be removed tomorrow prior to d/c.  Will review wound care and restrictions again tomorrow as well.  Overall stable post procedure.   Inappropriate ICD shock Likely in setting of wound settling ? Possible air per Industry and tech support.  No further.  Programming changes made to both sensing vector and detection interval extended to 4.5s.  For questions or updates,  please contact [facility] Please consult [email or web address] for contact info under Cardiology/STEMI.  Signed, Graciella Freer, PA-C  06/28/2023, 2:59 PM

## 2023-06-28 NOTE — TOC CM/SW Note (Signed)
 Transition of Care [facility]) - Inpatient Brief Assessment   Patient Details  Name: Molly Howard MRN: [id number] Date of Birth: 03/01/1986  [facility] CM/SW Contact:    Gala Lewandowsky, RN Phone Number: 06/28/2023, 3:45 PM   Clinical Narrative: Patient presented s/p ICD implant. Patient was discussed in progression meeting and patient will stay overnight for overnight pulse oximetry. Case Manager will continue to follow for additional transition of care needs.    Transition of Care Asessment: Insurance and Status: Insurance coverage has been reviewed Patient has primary care physician: Yes Prior/Current Home Services: No current home services Social Drivers of Health Review: SDOH reviewed needs interventions Readmission risk has been reviewed: Yes Transition of care needs: transition of care needs identified, TOC will continue to follow

## 2023-06-28 NOTE — Plan of Care (Signed)

## 2023-06-28 NOTE — Progress Notes (Addendum)
 Patient signed out AMA.   Patient received inappropriate ICD shock over night. She was informed by both Rozann Lesches PA and Dr. Lalla Brothers that they would keep her one more night to monitor her rhythm and ICD for proper function. Patient was aware and agreed with staying through tomorrow morning until this evening. The patient's sister came to the hospital and the patient stated she was going home. She said her sister is low on gas and has to work the next day and does not get out until 3pm. She stated she has no one else who can pick her up and that her sister can't make multiple trips with no gas. The patient was offered hospital set-up transportation home tomorrow so she could be monitored over night and she declined saying she "just wanted to go home". She could not provide any other reason why she wanted to leave. Dr. Lalla Brothers was notified and this RN called Dr. Lalla Brothers from the patients room so he could speak with her. Dr. Lalla Brothers had a lengthy conversation with the patient and made her aware it was in her best interest to be monitored over night and to leave in the morning. This RN informed the patient that leaving AMA could affect insurance paying for the hospital stay.The patient still insisted on signing out AMA. Her PIVs were removed and paperwork signed. The patient left the unit with all personal belongings with sister at side.

## 2023-06-28 NOTE — Discharge Instructions (Signed)
 Subcutaneous Cardioverter Defibrillator Implantation, Care After  After Your ICD (Implantable Cardiac Defibrillator)   You have a Environmental manager Subcutaneous ICD  ACTIVITY  Do not lift, push, pull, or carry anything over 10 pounds with the until you are instructed it is safe to do so by your provider.   You may drive AFTER your wound check, unless you have been told otherwise by your provider.   Ask your healthcare provider when you can go back to work and resume heavy lifting or exertion.    INCISION/Dressing If you are on a blood thinner such as Coumadin, Xarelto, Eliquis, Plavix, or Pradaxa please confirm with your provider when this should be resumed.   If large square, outer bandage is left in place, this can be removed after 24 hours from your procedure. Do not remove steri-strips or glue as below.   Monitor your defibrillator sites for redness, swelling, and drainage. Call the device clinic at [phone] if you experience these symptoms or fever/chills.  If your incision is sealed with Steri-strips or staples, you may shower 7 days after your procedure or when told by your provider. Do not remove the steri-strips or let the shower hit directly on your site. You may wash around your site with soap and water.    If you were discharged in a sling, please do not wear this during the day more than 48 hours after your surgery unless otherwise instructed. This may increase the risk of stiffness and soreness in your shoulder.   Avoid lotions, ointments, or perfumes over your incision until it is well-healed.  You may use a hot tub or a pool AFTER your wound check appointment if the incision is completely closed.  Your ICD is designed to protect you from life threatening heart rhythms. Because of this, you may receive a shock.   1 shock with no symptoms:  Call the office during business hours. 1 shock with symptoms (chest pain, chest pressure, dizziness, lightheadedness,  shortness of breath, overall feeling unwell):  Call 911. If you experience 2 or more shocks in 24 hours:  Call 911. If you receive a shock, you should not drive for 6 months per the [place] DMV IF you receive appropriate therapy from your ICD.   ICD Alerts:  Some alerts are vibratory and others beep. These are NOT emergencies. Please call our office to let us know. If this occurs at night or on weekends, it can wait until the next business day. Send a remote transmission.  DEVICE MANAGEMENT Remote monitoring is used to monitor your ICD from home. This monitoring is scheduled every 91 days by our office. It allows [place] to keep an eye on the functioning of your device to ensure it is working properly. You will routinely see your Electrophysiologist annually (more often if necessary).   You should receive your ID card for your new device in 4-8 weeks. Keep this card with you at all times once received. Consider wearing a medical alert bracelet or necklace.  Your ICD may be MRI compatible. This will be discussed at your next office visit/wound check.  You should avoid contact with strong electric or magnetic fields.   Do not use amateur (ham) radio equipment or electric (arc) welding torches. MP3 player headphones with magnets should not be used. Some devices are safe to use if held at least 12 inches (30 cm) from your defibrillator. These include power tools, lawn mowers, and speakers. If you are unsure if something is safe to  use, ask your health care provider.  When using your cell phone, hold it to the ear that is on the opposite side from the defibrillator. Do not leave your cell phone in a pocket over the defibrillator.  You may safely use electric blankets, heating pads, computers, and microwave ovens.  Call the office right away if: You have chest pain. You feel more than one shock. You feel more short of breath than you have felt before. You feel more light-headed than you have felt  before. Your incision starts to open up.  This information is not intended to replace advice given to you by your health care provider. Make sure you discuss any questions you have with your health care provider.

## 2023-06-28 NOTE — Progress Notes (Signed)
 CSW received consult for patient. CSW offered patient outpatient substance use treatment services resources. Patient accepted. All questions answered. No further questions reported at this time.

## 2023-06-29 ENCOUNTER — Other Ambulatory Visit: Payer: Self-pay | Admitting: Pharmacist

## 2023-06-29 ENCOUNTER — Telehealth: Payer: Self-pay | Admitting: Pharmacist

## 2023-06-29 NOTE — Progress Notes (Signed)
   Outreach Note  06/29/2023 Name: Molly Howard MRN: [id number] DOB: 04-10-1986  Referred by: Margarita Mail, DO Reason for referral : Medication Adherence, Medication Management, and Medication Assistance   Third unsuccessful telephone outreach was attempted today to contact the patient about Care Management needs.The patient was referred to the case management team by the provider who initiated CCM services. The provider has been notified of our unsuccessful attempts to make or maintain contact with the patient.   Follow Up Plan: Will collaborate with Care Guide to outreach to schedule follow up with me  Estelle Grumbles, PharmD, [facility] [phone]

## 2023-07-05 ENCOUNTER — Other Ambulatory Visit: Payer: Self-pay | Admitting: *Deleted

## 2023-07-05 NOTE — Patient Outreach (Signed)
  Medicaid Managed Care   Unsuccessful Attempt Note   07/05/2023 Name: Molly Howard MRN: [id number] DOB: 24-Jan-1986  Referred by: Margarita Mail, DO Reason for referral : High Risk Managed Medicaid (Unsuccessful RNCM follow up telephone outreach)   An unsuccessful telephone outreach was attempted today. The patient was referred to the case management team for assistance with care management and care coordination.    Follow Up Plan: A HIPAA compliant phone message was left for the patient providing contact information and requesting a return call. and The Managed Medicaid care management team will reach out to the patient again over the next 7 days.    Estanislado Emms RN, BSN [place]  [facility] RN Care Manager [phone]

## 2023-07-05 NOTE — Patient Instructions (Signed)
 Visit Information  [name]  - as a part of your Medicaid benefit, you are eligible for care management and care coordination services at no cost or copay. I was unable to reach you by phone today but would be happy to help you with your health related needs. Please feel free to call me @ [phone].   A member of the Managed Medicaid care management team will reach out to you again over the next 7 days.   Estanislado Emms RN, BSN [place]  [facility] RN Care Manager [phone]

## 2023-07-07 ENCOUNTER — Ambulatory Visit: Attending: Cardiology | Admitting: Cardiology

## 2023-07-07 ENCOUNTER — Encounter: Payer: Self-pay | Admitting: Cardiology

## 2023-07-07 ENCOUNTER — Ambulatory Visit: Payer: Medicaid Other

## 2023-07-07 VITALS — BP 128/84 | HR 87 | Ht 67.0 in | Wt 218.0 lb

## 2023-07-07 DIAGNOSIS — Z95810 Presence of automatic (implantable) cardiac defibrillator: Secondary | ICD-10-CM

## 2023-07-07 DIAGNOSIS — I5020 Unspecified systolic (congestive) heart failure: Secondary | ICD-10-CM

## 2023-07-07 LAB — CUP PACEART INCLINIC DEVICE CHECK
Date Time Interrogation Session: 20250306122443
Implantable Lead Connection Status: 753985
Implantable Lead Implant Date: 20250224
Implantable Lead Location: 753860
Implantable Lead Model: 3501
Implantable Lead Serial Number: 264353
Implantable Pulse Generator Implant Date: 20250224
Pulse Gen Serial Number: 321419

## 2023-07-07 NOTE — Telephone Encounter (Signed)
 Transmission received 07/04/2023.

## 2023-07-07 NOTE — Patient Instructions (Signed)
 Medication Instructions:  The current medical regimen is effective;  continue present plan and medications.  *If you need a refill on your cardiac medications before your next appointment, please call your pharmacy*   Follow-Up: At [facility], you and your health needs are our priority.  As part of our continuing mission to provide you with exceptional heart care, we have created designated Provider Care Teams.  These Care Teams include your primary Cardiologist (physician) and Advanced Practice Providers (APPs -  Physician Assistants and Nurse Practitioners) who all work together to provide you with the care you need, when you need it.  We recommend signing up for the patient portal called "MyChart".  Sign up information is provided on this After Visit Summary.  MyChart is used to connect with patients for Virtual Visits (Telemedicine).  Patients are able to view lab/test results, encounter notes, upcoming appointments, etc.  Non-urgent messages can be sent to your provider as well.   To learn more about what you can do with MyChart, go to [email or web address].    Your next appointment:   1 week(s)  Provider:   Sherie Don, NP    Other Instructions  NO SHOWERING, WEAR BRA AS LITTLE AS POSSIBLE

## 2023-07-07 NOTE — Progress Notes (Signed)
 S-ICD Wound check appointment.  Steri-strips removed from L flank and central chest wounds. Wound without redness or edema. Incision edges approximated with elevated skin lip at L flank wound. Steri-strips re-applied at skin lip site.  Normal device function. SMART pass turned on. Impedence stable.  No high ventricular rates noted. Patient educated about wound care. ROV in 1 week to reassess L flank incision.

## 2023-07-15 ENCOUNTER — Telehealth: Payer: Self-pay

## 2023-07-15 NOTE — Progress Notes (Signed)
..   Medicaid Managed Care   Unsuccessful Outreach Note  07/15/2023 Name: Molly Howard MRN: [id number] DOB: 19-May-1985  Referred by: Margarita Mail, DO Reason for referral : High Risk Managed Medicaid (Attempted to reach the patient today to reschedule her missed phone appt with the MM RNCM. Her number was not in order.)   A second unsuccessful telephone outreach was attempted today. The patient was referred to the case management team for assistance with care management and care coordination.   Follow Up Plan: The care management team will reach out to the patient again over the next 7 days.   Weston Settle [facility], [facility] Guide Direct Dial: [phone]  Fax: [phone]

## 2023-07-18 ENCOUNTER — Ambulatory Visit: Attending: Cardiology | Admitting: Cardiology

## 2023-08-05 ENCOUNTER — Ambulatory Visit: Payer: Self-pay

## 2023-08-05 NOTE — Patient Outreach (Signed)
 Care Coordination   08/05/2023 Name: Molly Howard MRN: [id number] DOB: 1985-08-16   Care Coordination Outreach Attempts:  An unsuccessful outreach was attempted for an appointment today. Unable to reach patient or leave voice message. Message states" your call cannot be completed as dialed."  Attempted call x 3.   Follow Up Plan:  Additional outreach attempts will be made to offer the patient complex care management information and services.   Encounter Outcome:  No Answer   Care Coordination Interventions:  No, not indicated    George Ina RN, BSN, CCM [employer], Population Health Case Manager Phone: [phone]

## 2023-08-06 ENCOUNTER — Emergency Department

## 2023-08-06 ENCOUNTER — Other Ambulatory Visit: Payer: Self-pay

## 2023-08-06 ENCOUNTER — Observation Stay
Admission: EM | Admit: 2023-08-06 | Discharge: 2023-08-09 | Disposition: A | Discharge status: Home / Self Care | Attending: Internal Medicine | Admitting: Internal Medicine

## 2023-08-06 DIAGNOSIS — S2020XA Contusion of thorax, unspecified, initial encounter: Secondary | ICD-10-CM | POA: Insufficient documentation

## 2023-08-06 DIAGNOSIS — N182 Chronic kidney disease, stage 2 (mild): Secondary | ICD-10-CM | POA: Insufficient documentation

## 2023-08-06 DIAGNOSIS — E1122 Type 2 diabetes mellitus with diabetic chronic kidney disease: Secondary | ICD-10-CM | POA: Insufficient documentation

## 2023-08-06 DIAGNOSIS — I130 Hypertensive heart and chronic kidney disease with heart failure and stage 1 through stage 4 chronic kidney disease, or unspecified chronic kidney disease: Secondary | ICD-10-CM | POA: Diagnosis not present

## 2023-08-06 DIAGNOSIS — I5022 Chronic systolic (congestive) heart failure: Secondary | ICD-10-CM | POA: Insufficient documentation

## 2023-08-06 DIAGNOSIS — E1129 Type 2 diabetes mellitus with other diabetic kidney complication: Secondary | ICD-10-CM

## 2023-08-06 DIAGNOSIS — R0789 Other chest pain: Secondary | ICD-10-CM | POA: Diagnosis present

## 2023-08-06 DIAGNOSIS — Z87891 Personal history of nicotine dependence: Secondary | ICD-10-CM | POA: Diagnosis not present

## 2023-08-06 DIAGNOSIS — Z79899 Other long term (current) drug therapy: Secondary | ICD-10-CM | POA: Diagnosis not present

## 2023-08-06 DIAGNOSIS — I428 Other cardiomyopathies: Secondary | ICD-10-CM

## 2023-08-06 DIAGNOSIS — R7989 Other specified abnormal findings of blood chemistry: Secondary | ICD-10-CM | POA: Diagnosis not present

## 2023-08-06 DIAGNOSIS — I5020 Unspecified systolic (congestive) heart failure: Secondary | ICD-10-CM

## 2023-08-06 DIAGNOSIS — Z95810 Presence of automatic (implantable) cardiac defibrillator: Secondary | ICD-10-CM | POA: Diagnosis not present

## 2023-08-06 DIAGNOSIS — Z7984 Long term (current) use of oral hypoglycemic drugs: Secondary | ICD-10-CM | POA: Diagnosis not present

## 2023-08-06 DIAGNOSIS — S20212A Contusion of left front wall of thorax, initial encounter: Principal | ICD-10-CM

## 2023-08-06 DIAGNOSIS — E119 Type 2 diabetes mellitus without complications: Secondary | ICD-10-CM

## 2023-08-06 DIAGNOSIS — I10 Essential (primary) hypertension: Secondary | ICD-10-CM | POA: Diagnosis present

## 2023-08-06 DIAGNOSIS — R079 Chest pain, unspecified: Secondary | ICD-10-CM | POA: Diagnosis present

## 2023-08-06 DIAGNOSIS — Z7985 Long-term (current) use of injectable non-insulin antidiabetic drugs: Secondary | ICD-10-CM | POA: Insufficient documentation

## 2023-08-06 LAB — CBC
HCT: 33.6 % — ABNORMAL LOW (ref 36.0–46.0)
Hemoglobin: 10.2 g/dL — ABNORMAL LOW (ref 12.0–15.0)
MCH: 30.1 pg (ref 26.0–34.0)
MCHC: 30.4 g/dL (ref 30.0–36.0)
MCV: 99.1 fL (ref 80.0–100.0)
Platelets: 373 10*3/uL (ref 150–400)
RBC: 3.39 MIL/uL — ABNORMAL LOW (ref 3.87–5.11)
RDW: 13.8 % (ref 11.5–15.5)
WBC: 5.8 10*3/uL (ref 4.0–10.5)
nRBC: 0 % (ref 0.0–0.2)

## 2023-08-06 LAB — POC URINE PREG, ED: Preg Test, Ur: NEGATIVE

## 2023-08-06 LAB — TROPONIN I (HIGH SENSITIVITY)
Troponin I (High Sensitivity): 46 ng/L — ABNORMAL HIGH (ref <18)
Troponin I (High Sensitivity): 65 ng/L — ABNORMAL HIGH (ref <18)

## 2023-08-06 LAB — BASIC METABOLIC PANEL WITH GFR
Anion gap: 9 (ref 5–15)
BUN: 36 mg/dL — ABNORMAL HIGH (ref 6–20)
CO2: 19 mmol/L — ABNORMAL LOW (ref 22–32)
Calcium: 7.5 mg/dL — ABNORMAL LOW (ref 8.9–10.3)
Chloride: 114 mmol/L — ABNORMAL HIGH (ref 98–111)
Creatinine, Ser: 1.44 mg/dL — ABNORMAL HIGH (ref 0.44–1.00)
GFR, Estimated: 47 mL/min — ABNORMAL LOW (ref >60)
Glucose, Bld: 146 mg/dL — ABNORMAL HIGH (ref 70–99)
Potassium: 4.3 mmol/L (ref 3.5–5.1)
Sodium: 142 mmol/L (ref 135–145)

## 2023-08-06 MED ORDER — IOHEXOL 300 MG/ML  SOLN
100.0000 mL | Freq: Once | INTRAMUSCULAR | Status: AC | PRN
  Administered 2023-08-06: 100 mL via INTRAVENOUS

## 2023-08-06 MED ORDER — OXYCODONE-ACETAMINOPHEN 5-325 MG PO TABS
1.0000 | ORAL_TABLET | Freq: Once | ORAL | Status: AC
  Administered 2023-08-06: 1 via ORAL
  Filled 2023-08-06: qty 1

## 2023-08-06 NOTE — Group Note (Deleted)
 Date:  08/06/2023 Time:  9:53 PM  Group Topic/Focus:  Wrap-Up Group:   The focus of this group is to help patients review their daily goal of treatment and discuss progress on daily workbooks.     Participation Level:  {BHH PARTICIPATION WUJWJ:19147}  Participation Quality:  {BHH PARTICIPATION QUALITY:22265}  Affect:  {BHH AFFECT:22266}  Cognitive:  {BHH COGNITIVE:22267}  Insight: {BHH Insight2:20797}  Engagement in Group:  {BHH ENGAGEMENT IN WGNFA:21308}  Modes of Intervention:  {BHH MODES OF INTERVENTION:22269}  Additional Comments:  ***  Maglione,Angell Honse E 08/06/2023, 9:53 PM

## 2023-08-06 NOTE — ED Notes (Signed)
 Encouraged pt to provide urine sample as soon as possible.

## 2023-08-06 NOTE — ED Notes (Signed)
 This RN went to check on pt and to see if pt had been able to provide a urine sample. Pt stated she had not and that she still was not able to urinate at this time. Pt talking on phone. No noted distress.
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
## 2023-08-06 NOTE — ED Notes (Signed)
 Went to see if pt could provide a urine sample. Pt stated she had already urinated and could not at this time.

## 2023-08-06 NOTE — ED Notes (Signed)
 Pt given two glasses of water to drink per MD provider so that pt could provide urine sample. Pt encouraged to drink them.
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
## 2023-08-07 ENCOUNTER — Observation Stay (HOSPITAL_BASED_OUTPATIENT_CLINIC_OR_DEPARTMENT_OTHER)
Admit: 2023-08-07 | Discharge: 2023-08-07 | Disposition: A | Discharge status: Home / Self Care | Attending: Medical | Admitting: Medical

## 2023-08-07 DIAGNOSIS — Z95810 Presence of automatic (implantable) cardiac defibrillator: Secondary | ICD-10-CM

## 2023-08-07 DIAGNOSIS — R079 Chest pain, unspecified: Secondary | ICD-10-CM

## 2023-08-07 DIAGNOSIS — E119 Type 2 diabetes mellitus without complications: Secondary | ICD-10-CM | POA: Diagnosis not present

## 2023-08-07 DIAGNOSIS — I10 Essential (primary) hypertension: Secondary | ICD-10-CM

## 2023-08-07 DIAGNOSIS — R7989 Other specified abnormal findings of blood chemistry: Secondary | ICD-10-CM

## 2023-08-07 DIAGNOSIS — I428 Other cardiomyopathies: Secondary | ICD-10-CM | POA: Diagnosis not present

## 2023-08-07 LAB — URINE DRUG SCREEN, QUALITATIVE (ARMC ONLY)
Amphetamines, Ur Screen: NOT DETECTED
Barbiturates, Ur Screen: NOT DETECTED
Benzodiazepine, Ur Scrn: NOT DETECTED
Cannabinoid 50 Ng, Ur ~~LOC~~: NOT DETECTED
Cocaine Metabolite,Ur ~~LOC~~: NOT DETECTED
MDMA (Ecstasy)Ur Screen: NOT DETECTED
Methadone Scn, Ur: NOT DETECTED
Opiate, Ur Screen: NOT DETECTED
Phencyclidine (PCP) Ur S: NOT DETECTED
Tricyclic, Ur Screen: NOT DETECTED

## 2023-08-07 LAB — ECHOCARDIOGRAM COMPLETE
AR max vel: 1.68 cm2
AV Peak grad: 6.5 mmHg
Ao pk vel: 1.27 m/s
Area-P 1/2: 4.01 cm2
Calc EF: 52.6 %
Height: 67 in
S' Lateral: 4.4 cm
Single Plane A2C EF: 51.3 %
Single Plane A4C EF: 52 %
Weight: 3488 [oz_av]

## 2023-08-07 LAB — CBC
HCT: 30.5 % — ABNORMAL LOW (ref 36.0–46.0)
Hemoglobin: 9.1 g/dL — ABNORMAL LOW (ref 12.0–15.0)
MCH: 29.4 pg (ref 26.0–34.0)
MCHC: 29.8 g/dL — ABNORMAL LOW (ref 30.0–36.0)
MCV: 98.7 fL (ref 80.0–100.0)
Platelets: 365 10*3/uL (ref 150–400)
RBC: 3.09 MIL/uL — ABNORMAL LOW (ref 3.87–5.11)
RDW: 14 % (ref 11.5–15.5)
WBC: 5.4 10*3/uL (ref 4.0–10.5)
nRBC: 0 % (ref 0.0–0.2)

## 2023-08-07 LAB — BASIC METABOLIC PANEL WITH GFR
Anion gap: 5 (ref 5–15)
BUN: 33 mg/dL — ABNORMAL HIGH (ref 6–20)
CO2: 24 mmol/L (ref 22–32)
Calcium: 7.3 mg/dL — ABNORMAL LOW (ref 8.9–10.3)
Chloride: 111 mmol/L (ref 98–111)
Creatinine, Ser: 1.29 mg/dL — ABNORMAL HIGH (ref 0.44–1.00)
GFR, Estimated: 54 mL/min — ABNORMAL LOW (ref >60)
Glucose, Bld: 154 mg/dL — ABNORMAL HIGH (ref 70–99)
Potassium: 4.1 mmol/L (ref 3.5–5.1)
Sodium: 140 mmol/L (ref 135–145)

## 2023-08-07 LAB — CBG MONITORING, ED
Glucose-Capillary: 124 mg/dL — ABNORMAL HIGH (ref 70–99)
Glucose-Capillary: 143 mg/dL — ABNORMAL HIGH (ref 70–99)
Glucose-Capillary: 147 mg/dL — ABNORMAL HIGH (ref 70–99)
Glucose-Capillary: 160 mg/dL — ABNORMAL HIGH (ref 70–99)

## 2023-08-07 LAB — GLUCOSE, CAPILLARY
Glucose-Capillary: 181 mg/dL — ABNORMAL HIGH (ref 70–99)
Glucose-Capillary: 184 mg/dL — ABNORMAL HIGH (ref 70–99)

## 2023-08-07 LAB — TROPONIN I (HIGH SENSITIVITY): Troponin I (High Sensitivity): 53 ng/L — ABNORMAL HIGH (ref <18)

## 2023-08-07 LAB — HIV ANTIBODY (ROUTINE TESTING W REFLEX): HIV Screen 4th Generation wRfx: NONREACTIVE

## 2023-08-07 MED ORDER — TRAZODONE HCL 50 MG PO TABS: 25.0000 mg | ORAL_TABLET | Freq: Every evening | ORAL | Status: DC | PRN

## 2023-08-07 MED ORDER — SODIUM CHLORIDE 0.9 % IV SOLN: INTRAVENOUS | Status: AC

## 2023-08-07 MED ORDER — DAPAGLIFLOZIN PROPANEDIOL 10 MG PO TABS
10.0000 mg | ORAL_TABLET | Freq: Every day | ORAL | Status: DC
  Administered 2023-08-07 – 2023-08-09 (×3): 10 mg via ORAL
  Filled 2023-08-07 (×3): qty 1

## 2023-08-07 MED ORDER — CARVEDILOL 6.25 MG PO TABS
ORAL_TABLET | ORAL | Status: AC
  Filled 2023-08-07: qty 2

## 2023-08-07 MED ORDER — SPIRONOLACTONE 25 MG PO TABS
25.0000 mg | ORAL_TABLET | Freq: Every day | ORAL | Status: DC
  Administered 2023-08-07 – 2023-08-09 (×3): 25 mg via ORAL
  Filled 2023-08-07 (×3): qty 1

## 2023-08-07 MED ORDER — CARVEDILOL 12.5 MG PO TABS
12.5000 mg | ORAL_TABLET | Freq: Two times a day (BID) | ORAL | Status: DC
  Administered 2023-08-07 – 2023-08-09 (×5): 12.5 mg via ORAL
  Filled 2023-08-07: qty 2
  Filled 2023-08-07 (×4): qty 1

## 2023-08-07 MED ORDER — ACETAMINOPHEN 325 MG PO TABS: 650.0000 mg | ORAL_TABLET | Freq: Four times a day (QID) | ORAL | Status: DC | PRN

## 2023-08-07 MED ORDER — DIGOXIN 125 MCG PO TABS
0.0625 mg | ORAL_TABLET | Freq: Every day | ORAL | Status: DC
  Administered 2023-08-07 – 2023-08-09 (×3): 0.0625 mg via ORAL
  Filled 2023-08-07 (×3): qty 0.5

## 2023-08-07 MED ORDER — ACETAMINOPHEN 650 MG RE SUPP: 650.0000 mg | Freq: Four times a day (QID) | RECTAL | Status: DC | PRN

## 2023-08-07 MED ORDER — INSULIN ASPART 100 UNIT/ML IJ SOLN: 0.0000 [IU] | Freq: Every day | INTRAMUSCULAR | Status: DC

## 2023-08-07 MED ORDER — TORSEMIDE 20 MG PO TABS
40.0000 mg | ORAL_TABLET | Freq: Every day | ORAL | Status: DC
  Administered 2023-08-07 – 2023-08-09 (×3): 40 mg via ORAL
  Filled 2023-08-07 (×3): qty 2

## 2023-08-07 MED ORDER — MAGNESIUM HYDROXIDE 400 MG/5ML PO SUSP: 30.0000 mL | Freq: Every day | ORAL | Status: DC | PRN

## 2023-08-07 MED ORDER — INSULIN ASPART 100 UNIT/ML IJ SOLN
0.0000 [IU] | Freq: Three times a day (TID) | INTRAMUSCULAR | Status: DC
  Administered 2023-08-08: 3 [IU] via SUBCUTANEOUS
  Filled 2023-08-07 (×2): qty 1

## 2023-08-07 MED ORDER — ENOXAPARIN SODIUM 60 MG/0.6ML IJ SOSY
50.0000 mg | PREFILLED_SYRINGE | INTRAMUSCULAR | Status: DC
  Filled 2023-08-07 (×3): qty 0.6

## 2023-08-07 MED ORDER — ACETAMINOPHEN 325 MG RE SUPP: 650.0000 mg | Freq: Four times a day (QID) | RECTAL | Status: DC | PRN

## 2023-08-07 MED ORDER — ONDANSETRON HCL 4 MG/2ML IJ SOLN: 4.0000 mg | Freq: Four times a day (QID) | INTRAMUSCULAR | Status: DC | PRN

## 2023-08-07 MED ORDER — ONDANSETRON HCL 4 MG PO TABS: 4.0000 mg | ORAL_TABLET | Freq: Four times a day (QID) | ORAL | Status: DC | PRN

## 2023-08-07 MED ORDER — SACUBITRIL-VALSARTAN 97-103 MG PO TABS
1.0000 | ORAL_TABLET | Freq: Two times a day (BID) | ORAL | Status: DC
  Administered 2023-08-07 – 2023-08-09 (×6): 1 via ORAL
  Filled 2023-08-07 (×7): qty 1

## 2023-08-07 NOTE — Consult Note (Signed)
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 function - Status post S-ICD implantation February 2025 - Coreg 6.25 mg  twice daily - Farxiga 10mg  daily - Digoxin 0.0625 mg daily - Entresto 97-103 mg twice daily - Spironolactone 25 mg daily - restart Torsemide 40 mg daily - she appears euvolemic  Hypertension - Blood pressures mildly elevated  Substance use -UDS ordered   For questions or updates, please contact [facility] Please consult [email or web address] for contact info under    Signed, Michell Kader David Stall, PA-C  08/07/2023 7:13 AM

## 2023-08-07 NOTE — Assessment & Plan Note (Signed)
-   The patient will be placed on supplemental coverage with NovoLog. 
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
## 2023-08-07 NOTE — Progress Notes (Signed)
  Echocardiogram 2D Echocardiogram has been performed.  Lenor Coffin 08/07/2023, 8:51 AM
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
## 2023-08-07 NOTE — Assessment & Plan Note (Addendum)
-   We will continue Entresto, Aldactone, Demadex, digoxin, Coreg and [place].

## 2023-08-07 NOTE — ED Notes (Signed)
Hospitalist to bedside.
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
## 2023-08-07 NOTE — ED Notes (Signed)
 Device interrogation failed x 3. MD notified.

## 2023-08-07 NOTE — Assessment & Plan Note (Signed)
-   We will continue antihypertensive therapy.
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
## 2023-08-07 NOTE — Progress Notes (Signed)
 PHARMACIST - PHYSICIAN COMMUNICATION  CONCERNING:  Enoxaparin (Lovenox) for DVT Prophylaxis    RECOMMENDATION: Patient was prescribed enoxaprin 40mg  q24 hours for VTE prophylaxis.   Filed Weights   08/07/23 0237  Weight: 98.9 kg (218 lb)    Body mass index is 34.14 kg/m.  Estimated Creatinine Clearance: 63.3 mL/min (A) (by C-G formula based on SCr of 1.44 mg/dL (H)).   Based on [facility] policy patient is candidate for enoxaparin 0.5mg /kg TBW SQ every 24 hours based on BMI being >30.  DESCRIPTION: Pharmacy has adjusted enoxaparin dose per [facility] policy.  Patient is now receiving enoxaparin 0.5 mg/kg every 24 hours   Otelia Sergeant, PharmD, [facility] 08/07/2023 3:18 AM

## 2023-08-07 NOTE — ED Notes (Signed)
Pt given sandwich tray and diet soda

## 2023-08-08 ENCOUNTER — Other Ambulatory Visit: Payer: Self-pay

## 2023-08-08 ENCOUNTER — Other Ambulatory Visit: Payer: Self-pay | Admitting: Family

## 2023-08-08 DIAGNOSIS — I428 Other cardiomyopathies: Secondary | ICD-10-CM

## 2023-08-08 DIAGNOSIS — I10 Essential (primary) hypertension: Secondary | ICD-10-CM | POA: Diagnosis not present

## 2023-08-08 DIAGNOSIS — E1129 Type 2 diabetes mellitus with other diabetic kidney complication: Secondary | ICD-10-CM

## 2023-08-08 DIAGNOSIS — R0789 Other chest pain: Principal | ICD-10-CM

## 2023-08-08 LAB — HEMOGLOBIN A1C
Hgb A1c MFr Bld: 6.7 % — ABNORMAL HIGH (ref 4.8–5.6)
Mean Plasma Glucose: 146 mg/dL

## 2023-08-08 LAB — GLUCOSE, CAPILLARY
Glucose-Capillary: 119 mg/dL — ABNORMAL HIGH (ref 70–99)
Glucose-Capillary: 189 mg/dL — ABNORMAL HIGH (ref 70–99)
Glucose-Capillary: 137 mg/dL — ABNORMAL HIGH (ref 70–99)

## 2023-08-08 MED ORDER — CARVEDILOL 12.5 MG PO TABS
12.5000 mg | ORAL_TABLET | Freq: Two times a day (BID) | ORAL | 1 refills | Status: DC
  Filled 2023-08-08: qty 60, 30d supply, fill #0

## 2023-08-08 MED ORDER — SACUBITRIL-VALSARTAN 97-103 MG PO TABS
1.0000 | ORAL_TABLET | Freq: Two times a day (BID) | ORAL | 1 refills | Status: DC
  Filled 2023-08-08: qty 60, 30d supply, fill #0

## 2023-08-08 MED ORDER — TORSEMIDE 20 MG PO TABS
40.0000 mg | ORAL_TABLET | Freq: Every day | ORAL | 1 refills | Status: DC
  Filled 2023-08-08: qty 60, 30d supply, fill #0

## 2023-08-08 MED ORDER — DIGOXIN 125 MCG PO TABS
0.0625 mg | ORAL_TABLET | Freq: Every day | ORAL | 1 refills | Status: DC
  Filled 2023-08-08: qty 15, 30d supply, fill #0

## 2023-08-08 MED ORDER — DAPAGLIFLOZIN PROPANEDIOL 10 MG PO TABS
10.0000 mg | ORAL_TABLET | Freq: Every day | ORAL | 1 refills | Status: DC
  Filled 2023-08-08: qty 30, 30d supply, fill #0

## 2023-08-08 MED ORDER — SPIRONOLACTONE 25 MG PO TABS
25.0000 mg | ORAL_TABLET | Freq: Every day | ORAL | 1 refills | Status: DC
  Filled 2023-08-08: qty 30, 30d supply, fill #0

## 2023-08-08 NOTE — TOC Progression Note (Addendum)
 Transition of Care [facility]) - Progression Note    Patient Details  Name: Molly Howard MRN: [id number] Date of Birth: 06-15-1985  Transition of Care [facility] CM/SW Contact  Truddie Hidden, RN Phone Number: 08/08/2023, 2:44 PM  Clinical Narrative:    Spoke with patient regarding discharge plan. She is unable to return to her prior living situation at her sister's home.She intermittently lives at [facility]. Patient stated she has tried to obtain housing. She is currently waiting on disability approval to attempt housing via the housing authority. She reports she has tried to obtain assistance with housing via DSS, and local shelters unsuccessfully. Patient states she does not have a phone to call resources provided. She has a friend in [place]  that will allow her to stay  with her. Patient stated she also does not have transportation to get to [place]. Patient advised housing and transportation resources would be added to her AVS.   2:48pm Spoke with patient to get address of her friend in [place] to arrange transportation. Patient stated she did not have the address and is unable to get in touch with her friend.   3:21pm Address obtained for patient's friend address. [address] [place] [address]. Patient will be allowed to stay at her friend's house one day. She plans to go to DSS there to obtain assistance for housing voucher.           Expected Discharge Plan and Services         Expected Discharge Date: 08/08/23                                     Social Determinants of Health (SDOH) Interventions SDOH Screenings   Food Insecurity: No Food Insecurity (08/07/2023)  Housing: High Risk (08/07/2023)  Transportation Needs: No Transportation Needs (08/07/2023)  Utilities: Not At Risk (08/07/2023)  Alcohol Screen: Low Risk  (04/15/2023)  Depression (PHQ2-9): Low Risk  (03/02/2023)  Financial Resource Strain: High Risk (04/15/2023)  Tobacco Use: Medium Risk  (08/06/2023)    Readmission Risk Interventions    11/08/2022    3:23 PM 07/25/2022   10:35 AM 02/15/2022    2:34 PM  Readmission Risk Prevention Plan  Transportation Screening Complete Complete Complete  PCP or Specialist Appt within 3-5 Days  Complete Complete  Social Work Consult for Recovery Care Planning/Counseling  Complete Complete  Palliative Care Screening  Not Applicable Not Applicable  Medication Review Ocean[employer]) Complete Complete Complete  PCP or Specialist appointment within 3-5 days of discharge Complete    SW Recovery Care/Counseling Consult Complete    Palliative Care Screening Not Applicable    Skilled Nursing Facility Not Applicable
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
## 2023-08-08 NOTE — Progress Notes (Signed)
 Patient discharged to wait for cab with signed voucher provided by case manager.Then informed that cab service no longer agreeable to take patient to destination. Patient returned to floor. Offered cab voucher to local destination, patient waiting to see if dad is aggregate to her coming to his house in [place]. MD made aware. Will continue to follow up.

## 2023-08-08 NOTE — Plan of Care (Signed)
   Problem: Activity: Goal: Risk for activity intolerance will decrease Outcome: Progressing   Problem: Pain Managment: Goal: General experience of comfort will improve and/or be controlled Outcome: Progressing   Problem: Safety: Goal: Ability to remain free from injury will improve Outcome: Progressing
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
## 2023-08-08 NOTE — Plan of Care (Signed)
  Problem: Fluid Volume: Goal: Ability to maintain a balanced intake and output will improve Outcome: Progressing   Problem: Nutritional: Goal: Maintenance of adequate nutrition will improve Outcome: Progressing   Problem: Clinical Measurements: Goal: Will remain free from infection Outcome: Progressing

## 2023-08-08 NOTE — TOC Transition Note (Signed)
 Transition of Care [facility]) - Discharge Note   Patient Details  Name: Molly Howard MRN: [id number] Date of Birth: 1986-04-01  [facility] CM/SW Contact:  Truddie Hidden, RN Phone Number: 08/08/2023, 4:22 PM   Clinical Narrative:    Per initial contact with [employer]  they were agreeable to transport. RNCM called back to schedule ride per dispatch they do not travel beyond 50 miles one way. Patient and nurse notified. Bus tickets provided by [facility] are for local transport. Patient does not wish to receive bus or taxi for local shelter transport. She stated she would call her father to see if he would be able to take her to [place].   TOC signing off.           Patient Goals and CMS Choice            Discharge Placement                       Discharge Plan and Services Additional resources added to the After Visit Summary for                                       Social Drivers of Health (SDOH) Interventions SDOH Screenings   Food Insecurity: No Food Insecurity (08/07/2023)  Housing: High Risk (08/07/2023)  Transportation Needs: No Transportation Needs (08/07/2023)  Utilities: Not At Risk (08/07/2023)  Alcohol Screen: Low Risk  (04/15/2023)  Depression (PHQ2-9): Low Risk  (03/02/2023)  Financial Resource Strain: High Risk (04/15/2023)  Tobacco Use: Medium Risk (08/06/2023)     Readmission Risk Interventions    11/08/2022    3:23 PM 07/25/2022   10:35 AM 02/15/2022    2:34 PM  Readmission Risk Prevention Plan  Transportation Screening Complete Complete Complete  PCP or Specialist Appt within 3-5 Days  Complete Complete  Social Work Consult for Recovery Care Planning/Counseling  Complete Complete  Palliative Care Screening  Not Applicable Not Applicable  Medication Review Oceanographer) Complete Complete Complete  PCP or Specialist appointment within 3-5 days of discharge Complete    SW Recovery Care/Counseling Consult Complete     Palliative Care Screening Not Applicable    Skilled Nursing Facility Not Applicable

## 2023-08-09 ENCOUNTER — Other Ambulatory Visit (HOSPITAL_COMMUNITY): Payer: Self-pay

## 2023-08-09 ENCOUNTER — Telehealth (HOSPITAL_COMMUNITY): Payer: Self-pay | Admitting: Pharmacy Technician

## 2023-08-09 ENCOUNTER — Other Ambulatory Visit: Payer: Self-pay

## 2023-08-09 ENCOUNTER — Telehealth: Payer: Self-pay | Admitting: Pharmacist

## 2023-08-09 DIAGNOSIS — E1129 Type 2 diabetes mellitus with other diabetic kidney complication: Secondary | ICD-10-CM

## 2023-08-09 LAB — GLUCOSE, CAPILLARY: Glucose-Capillary: 120 mg/dL — ABNORMAL HIGH (ref 70–99)

## 2023-08-09 MED ORDER — OZEMPIC (0.25 OR 0.5 MG/DOSE) 2 MG/3ML ~~LOC~~ SOPN
0.5000 mg | PEN_INJECTOR | SUBCUTANEOUS | 1 refills | Status: DC
Start: 2023-08-09 — End: 2023-10-04
  Filled 2023-08-09: qty 3, 28d supply, fill #0

## 2023-08-09 NOTE — Telephone Encounter (Signed)
 Patient Product/process development scientist completed.    The patient is insured through [facility] MEDICAID.     Ran test claim for Ozempic 2 mg/3 mland the current 30 day co-pay is $4.00.  Ran test claim for Wegovy 0.25 mg/0.5 ml and Requires Prior Authorizaiton  This test claim was processed through [facility]- copay amounts may vary at other pharmacies due to [employer], or as the patient moves through the different stages of their insurance plan.     Roland Earl, CPHT Pharmacy Technician III Certified Patient Advocate [facility] Patient Advocate Team Direct Number: [phone]  Fax: [phone]

## 2023-08-09 NOTE — Telephone Encounter (Signed)
Ozempic refill sent.
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
## 2023-08-09 NOTE — TOC Transition Note (Signed)
 Transition of Care [facility]) - Discharge Note   Patient Details  Name: Molly Howard MRN: [id number] Date of Birth: 12-15-1985  Transition of Care [facility]) CM/SW Contact:  Truddie Hidden, RN Phone Number: 08/09/2023, 11:01 AM   Clinical Narrative:     Spoke with patient regarding discharge today. She is requesting a cab [facility] in [place]. Cab arranged for [address] [place].   TOC signing off.           Patient Goals and CMS Choice            Discharge Placement                       Discharge Plan and Services Additional resources added to the After Visit Summary for                                       Social Drivers of Health (SDOH) Interventions SDOH Screenings   Food Insecurity: No Food Insecurity (08/07/2023)  Housing: High Risk (08/07/2023)  Transportation Needs: No Transportation Needs (08/07/2023)  Utilities: Not At Risk (08/07/2023)  Alcohol Screen: Low Risk  (04/15/2023)  Depression (PHQ2-9): Low Risk  (03/02/2023)  Financial Resource Strain: High Risk (04/15/2023)  Tobacco Use: Medium Risk (08/06/2023)     Readmission Risk Interventions    11/08/2022    3:23 PM 07/25/2022   10:35 AM 02/15/2022    2:34 PM  Readmission Risk Prevention Plan  Transportation Screening Complete Complete Complete  PCP or Specialist Appt within 3-5 Days  Complete Complete  Social Work Consult for Recovery Care Planning/Counseling  Complete Complete  Palliative Care Screening  Not Applicable Not Applicable  Medication Review Oceanographer) Complete Complete Complete  PCP or Specialist appointment within 3-5 days of discharge Complete    SW Recovery Care/Counseling Consult Complete    Palliative Care Screening Not Applicable    Skilled Nursing Facility Not Applicable
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
## 2023-08-09 NOTE — Progress Notes (Signed)
 Heart Failure Navigator Progress Note  Education Assessment and Provision:  Detailed education and instructions provided on heart failure disease management including the following:  Signs and symptoms of Heart Failure When to call the physician Importance of daily weights Low sodium diet Fluid restriction Medication management Anticipated future follow-up appointments  Patient education given on each of the above topics.  Patient acknowledges understanding via teach back method and acceptance of all instructions.  Patient was scheduled for a hospital follow-up 08/16/23 @ 2:00 PM. Patient reminded to call us with any questions or concerns if needed before this appointment.  Navigator will sign off at this time.  Roxy Horseman, RN, BSN [facility] Heart Failure Navigator Secure Chat Only

## 2023-08-09 NOTE — Plan of Care (Signed)
   Problem: Activity: Goal: Risk for activity intolerance will decrease Outcome: Progressing   Problem: Nutrition: Goal: Adequate nutrition will be maintained Outcome: Progressing   Problem: Coping: Goal: Level of anxiety will decrease Outcome: Progressing   Problem: Skin Integrity: Goal: Risk for impaired skin integrity will decrease Outcome: Progressing

## 2023-08-10 ENCOUNTER — Telehealth: Payer: Self-pay | Admitting: Physician Assistant

## 2023-08-10 NOTE — Telephone Encounter (Signed)
-----   Message from Cadence David Stall sent at 08/07/2023 10:50 AM EDT ----- Regarding: hosp follow-up Pt needs device clinic follow-up in 1-2 weeks and general cardiology follow-up in 1 month

## 2023-08-10 NOTE — Telephone Encounter (Signed)
Called and left a message for follow up.

## 2023-08-15 ENCOUNTER — Emergency Department

## 2023-08-15 ENCOUNTER — Telehealth: Payer: Self-pay | Admitting: Family

## 2023-08-15 ENCOUNTER — Other Ambulatory Visit: Payer: Self-pay

## 2023-08-15 ENCOUNTER — Observation Stay
Admission: EM | Admit: 2023-08-15 | Discharge: 2023-08-16 | Disposition: A | Discharge status: Home / Self Care | Attending: Emergency Medicine | Admitting: Emergency Medicine

## 2023-08-15 DIAGNOSIS — D649 Anemia, unspecified: Secondary | ICD-10-CM | POA: Diagnosis present

## 2023-08-15 DIAGNOSIS — Z79899 Other long term (current) drug therapy: Secondary | ICD-10-CM | POA: Insufficient documentation

## 2023-08-15 DIAGNOSIS — N179 Acute kidney failure, unspecified: Secondary | ICD-10-CM | POA: Diagnosis not present

## 2023-08-15 DIAGNOSIS — Z87891 Personal history of nicotine dependence: Secondary | ICD-10-CM | POA: Insufficient documentation

## 2023-08-15 DIAGNOSIS — R079 Chest pain, unspecified: Secondary | ICD-10-CM | POA: Insufficient documentation

## 2023-08-15 DIAGNOSIS — E1122 Type 2 diabetes mellitus with diabetic chronic kidney disease: Secondary | ICD-10-CM | POA: Diagnosis not present

## 2023-08-15 DIAGNOSIS — N189 Chronic kidney disease, unspecified: Secondary | ICD-10-CM | POA: Insufficient documentation

## 2023-08-15 DIAGNOSIS — Z95810 Presence of automatic (implantable) cardiac defibrillator: Secondary | ICD-10-CM | POA: Insufficient documentation

## 2023-08-15 DIAGNOSIS — N390 Urinary tract infection, site not specified: Secondary | ICD-10-CM | POA: Diagnosis not present

## 2023-08-15 DIAGNOSIS — E119 Type 2 diabetes mellitus without complications: Secondary | ICD-10-CM

## 2023-08-15 DIAGNOSIS — N1831 Chronic kidney disease, stage 3a: Secondary | ICD-10-CM | POA: Insufficient documentation

## 2023-08-15 DIAGNOSIS — I10 Essential (primary) hypertension: Secondary | ICD-10-CM | POA: Diagnosis present

## 2023-08-15 DIAGNOSIS — I429 Cardiomyopathy, unspecified: Secondary | ICD-10-CM | POA: Diagnosis not present

## 2023-08-15 DIAGNOSIS — E860 Dehydration: Secondary | ICD-10-CM | POA: Diagnosis not present

## 2023-08-15 DIAGNOSIS — I428 Other cardiomyopathies: Secondary | ICD-10-CM

## 2023-08-15 DIAGNOSIS — I5022 Chronic systolic (congestive) heart failure: Secondary | ICD-10-CM | POA: Diagnosis not present

## 2023-08-15 DIAGNOSIS — E1129 Type 2 diabetes mellitus with other diabetic kidney complication: Secondary | ICD-10-CM | POA: Diagnosis present

## 2023-08-15 DIAGNOSIS — Z7985 Long-term (current) use of injectable non-insulin antidiabetic drugs: Secondary | ICD-10-CM | POA: Diagnosis not present

## 2023-08-15 DIAGNOSIS — R55 Syncope and collapse: Secondary | ICD-10-CM | POA: Diagnosis present

## 2023-08-15 DIAGNOSIS — I130 Hypertensive heart and chronic kidney disease with heart failure and stage 1 through stage 4 chronic kidney disease, or unspecified chronic kidney disease: Secondary | ICD-10-CM | POA: Insufficient documentation

## 2023-08-15 DIAGNOSIS — I5020 Unspecified systolic (congestive) heart failure: Secondary | ICD-10-CM | POA: Diagnosis present

## 2023-08-15 LAB — BASIC METABOLIC PANEL WITH GFR
Anion gap: 10 (ref 5–15)
BUN: 24 mg/dL — ABNORMAL HIGH (ref 6–20)
CO2: 26 mmol/L (ref 22–32)
Calcium: 7.3 mg/dL — ABNORMAL LOW (ref 8.9–10.3)
Chloride: 102 mmol/L (ref 98–111)
Creatinine, Ser: 2.03 mg/dL — ABNORMAL HIGH (ref 0.44–1.00)
GFR, Estimated: 31 mL/min — ABNORMAL LOW (ref >60)
Glucose, Bld: 173 mg/dL — ABNORMAL HIGH (ref 70–99)
Potassium: 4.3 mmol/L (ref 3.5–5.1)
Sodium: 138 mmol/L (ref 135–145)

## 2023-08-15 LAB — URINALYSIS, W/ REFLEX TO CULTURE (INFECTION SUSPECTED)
Bilirubin Urine: NEGATIVE
Glucose, UA: >=500 mg/dL — AB
Hgb urine dipstick: NEGATIVE
Ketones, ur: NEGATIVE mg/dL
Nitrite: NEGATIVE
Protein, ur: >=300 mg/dL — AB
Specific Gravity, Urine: 1.024 (ref 1.005–1.030)
pH: 5 (ref 5.0–8.0)

## 2023-08-15 LAB — CBC
HCT: 33.6 % — ABNORMAL LOW (ref 36.0–46.0)
Hemoglobin: 10.5 g/dL — ABNORMAL LOW (ref 12.0–15.0)
MCH: 30.2 pg (ref 26.0–34.0)
MCHC: 31.3 g/dL (ref 30.0–36.0)
MCV: 96.6 fL (ref 80.0–100.0)
Platelets: 446 10*3/uL — ABNORMAL HIGH (ref 150–400)
RBC: 3.48 MIL/uL — ABNORMAL LOW (ref 3.87–5.11)
RDW: 14.3 % (ref 11.5–15.5)
WBC: 6.9 10*3/uL (ref 4.0–10.5)
nRBC: 0 % (ref 0.0–0.2)

## 2023-08-15 LAB — POC URINE PREG, ED: Preg Test, Ur: NEGATIVE

## 2023-08-15 LAB — BRAIN NATRIURETIC PEPTIDE: B Natriuretic Peptide: 254 pg/mL — ABNORMAL HIGH (ref 0.0–100.0)

## 2023-08-15 LAB — TROPONIN I (HIGH SENSITIVITY): Troponin I (High Sensitivity): 15 ng/L (ref <18)

## 2023-08-15 LAB — PREGNANCY, URINE: Preg Test, Ur: NEGATIVE

## 2023-08-15 MED ORDER — CARVEDILOL 6.25 MG PO TABS
12.5000 mg | ORAL_TABLET | Freq: Two times a day (BID) | ORAL | Status: DC
  Administered 2023-08-16: 12.5 mg via ORAL
  Filled 2023-08-15: qty 2

## 2023-08-15 MED ORDER — INSULIN ASPART 100 UNIT/ML IJ SOLN: 0.0000 [IU] | Freq: Three times a day (TID) | INTRAMUSCULAR | Status: DC

## 2023-08-15 MED ORDER — SODIUM CHLORIDE 0.9% FLUSH
3.0000 mL | Freq: Two times a day (BID) | INTRAVENOUS | Status: DC
  Administered 2023-08-16 (×2): 3 mL via INTRAVENOUS

## 2023-08-15 MED ORDER — HYDROCODONE-ACETAMINOPHEN 5-325 MG PO TABS: 1.0000 | ORAL_TABLET | ORAL | Status: DC | PRN

## 2023-08-15 MED ORDER — TORSEMIDE 20 MG PO TABS
20.0000 mg | ORAL_TABLET | Freq: Every day | ORAL | Status: DC
Start: 2023-08-16 — End: 2023-08-16
  Administered 2023-08-16: 20 mg via ORAL
  Filled 2023-08-15: qty 1

## 2023-08-15 MED ORDER — DAPAGLIFLOZIN PROPANEDIOL 10 MG PO TABS
10.0000 mg | ORAL_TABLET | Freq: Every day | ORAL | Status: DC
  Administered 2023-08-16: 10 mg via ORAL
  Filled 2023-08-15: qty 1

## 2023-08-15 MED ORDER — ACETAMINOPHEN 325 MG PO TABS: 650.0000 mg | ORAL_TABLET | Freq: Four times a day (QID) | ORAL | Status: DC | PRN

## 2023-08-15 MED ORDER — ACETAMINOPHEN 650 MG RE SUPP: 650.0000 mg | Freq: Four times a day (QID) | RECTAL | Status: DC | PRN

## 2023-08-15 MED ORDER — DIGOXIN 125 MCG PO TABS
0.0625 mg | ORAL_TABLET | Freq: Every day | ORAL | Status: DC
Start: 2023-08-16 — End: 2023-08-16
  Administered 2023-08-16: 0.0625 mg via ORAL
  Filled 2023-08-15: qty 0.5

## 2023-08-15 MED ORDER — FOSFOMYCIN TROMETHAMINE 3 G PO PACK
3.0000 g | PACK | Freq: Once | ORAL | Status: AC
  Administered 2023-08-15: 3 g via ORAL
  Filled 2023-08-15: qty 3

## 2023-08-15 MED ORDER — SPIRONOLACTONE 25 MG PO TABS
25.0000 mg | ORAL_TABLET | Freq: Every day | ORAL | Status: DC
  Administered 2023-08-16: 25 mg via ORAL
  Filled 2023-08-15: qty 1

## 2023-08-15 MED ORDER — ONDANSETRON HCL 4 MG/2ML IJ SOLN: 4.0000 mg | Freq: Four times a day (QID) | INTRAMUSCULAR | Status: DC | PRN

## 2023-08-15 MED ORDER — ONDANSETRON HCL 4 MG PO TABS: 4.0000 mg | ORAL_TABLET | Freq: Four times a day (QID) | ORAL | Status: DC | PRN

## 2023-08-15 MED ORDER — SACUBITRIL-VALSARTAN 97-103 MG PO TABS
1.0000 | ORAL_TABLET | Freq: Two times a day (BID) | ORAL | Status: DC
  Administered 2023-08-16: 1 via ORAL
  Filled 2023-08-15: qty 1

## 2023-08-15 MED ORDER — LACTATED RINGERS IV BOLUS
500.0000 mL | Freq: Once | INTRAVENOUS | Status: AC
  Administered 2023-08-15: 500 mL via INTRAVENOUS

## 2023-08-15 MED ORDER — INSULIN ASPART 100 UNIT/ML IJ SOLN: 0.0000 [IU] | Freq: Every day | INTRAMUSCULAR | Status: DC

## 2023-08-15 MED ORDER — ENOXAPARIN SODIUM 60 MG/0.6ML IJ SOSY
45.0000 mg | PREFILLED_SYRINGE | INTRAMUSCULAR | Status: DC
  Filled 2023-08-15: qty 0.6

## 2023-08-15 NOTE — Progress Notes (Deleted)
 Advanced Heart Failure Clinic Note   PCP: Margarita Mail, DO (last seen 10/24) Primary Cardiologist: Debbe Odea, MD/ Eula Listen, PA (last seen 11/24)  Chief Complaint: shortness of breath   HPI:  Molly Howard is a 37 y/o female with a history of DM, HTN, previous drug use, NSVT, PSVT, mod/severe TR, CKD, anemia and chronic biventricular heart failure.   Admitted 02/09/22 due to acute on chronic HF. + cocaine. Discharged after 7 days. Admitted 07/24/22 due to decompensated HFrEF. Received IV Lasix. Was on milrinone drip. Digoxin added. Uop about 41 liters with decrease in weight 116 kg >>91 kg. Admitted 11/05/22 due to swelling and fluid retention in arms and legs along with some central chest tightness and shortness of breath and all has been going on for about a week. Needed lasix gtt with milrinone and diuresing well,  total UOP >40 liters. Weight significantly down 130 kg-->97 kg. Weaned off of IV drips. Spironolactone stopped due to hyperkalemia. Urine drug screen + for cocaine. Negative for dvt/stenosis. X-ray nothing acute to explain left arm swelling.    Admitted 04/15/23 due to worsening dyspnea with associated bilateral lower extremity edema. Had orthopnea and PND. Had not been on her diuretic because she had gotten kicked out of the house and was unable to go back for her medications. IV diuresed. Advanced HF team consulted. Venofer given for anemia. ICD deferred due to HF exacerbation.   She presents today for a HF follow-up visit with a chief complaint of minimal shortness of breath. Has associated fatigue, occasional chest pain, palpitations and dizziness after medications along with this. Denies cough, abdominal distention, pedal edema, weight gain or difficulty sleeping. Tolerating ozempic although does get nauseous if she eats too much or too much fried foods. ICD to be implanted 06/27/23.  Drinks ~ 32 oz soda and 32 oz water daily. No further alcohol. No drug use since  07/24. Currently living with some friends at their house and says this is much better than the [employer] she was living in previously.    Echo 10/11/20: EF 30-35% along with moderate TR.  Echo 03/20/21: EF 25-30% along with severely elevated PA pressure of 70.8 mmHg, mild LAE, mild MR and mild/moderate TR Echo 06/30/21: EF  30-35% along with mild MR. Echo 09/18/21: EF  25-30%.   Echo 07/25/22: EF 25-30% with moderately elevated PA pressure of 50.7 mmHg, mild LAE, mild MR, small pericardial effusion and moderate/ severe TR  Cardiac MRI 05/19/21:  1. Mild LV dilatation, mild hypertrophy, and mild systolic dysfunction (EF 42%)  2.  No late gadolinium enhancement to suggest myocardial scar  3.  Normal RV size and systolic function (EF 50%)  RHC 07/28/22: Severely elevated right and left-sided filling pressures, moderate pulmonary hypertension and moderately reduced cardiac output. RA: 30 mmHg RV: 56/14 mmHg PW: 34 mmHg PA: 54/30 with a mean of 41 mmHg PA sat is 42% Cardiac output: 4.47 with a cardiac index of 2. CPO is 0.89 PAPI: 0.8  Zio patch 12/2022: Patient had a min HR of 62 bpm, max HR of 187 bpm, and avg HR of 76 bpm. Predominant underlying rhythm was Sinus Rhythm. 24 Ventricular Tachycardia runs occurred, the run with the fastest interval lasting 7 beats with a max rate of 187 bpm, the longest lasting 13 beats with an avg rate of 128 bpm. 1 run of Supraventricular Tachycardia occurred lasting 6 beats with a max rate of 135 bpm (avg 116 bpm). Isolated SVEs were rare (<1.0%), SVE  Couplets were rare (<1.0%), and SVE Triplets were rare (<1.0%). Isolated VEs were rare (<1.0%, 1371), VE Couplets were rare (<1.0%, 120), and VE Triplets were rare (<1.0%, 26). Ventricular Bigeminy was present.  Conclusion Occasional nonsustained VT. 1 episode of paroxysmal SVT. No sustained arrhythmias. No atrial fibrillation or atrial flutter.  ROS: All systems negative except as listed in HPI, PMH and Problem  List.  SH:  Social History   Socioeconomic History   Marital status: Single    Spouse name: Not on file   Number of children: 0   Years of education: Not on file   Highest education level: 12th grade  Occupational History   Occupation: Diaabled  Tobacco Use   Smoking status: Former    Current packs/day: 0.00    Types: Cigarettes    Quit date: 2022    Years since quitting: 3.2   Smokeless tobacco: Never  Vaping Use   Vaping status: Never Used  Substance and Sexual Activity   Alcohol use: Not Currently   Drug use: Not Currently    Types: Cocaine    Comment: Admits to using cocaine up and will July 2024.   Sexual activity: Not Currently    Birth control/protection: None  Other Topics Concern   Not on file  Social History Narrative   Lives locally.  Does not routinely exercise.  Has been using cocaine.   Social Drivers of Corporate investment banker Strain: High Risk (04/15/2023)   Overall Financial Resource Strain (CARDIA)    Difficulty of Paying Living Expenses: Very hard  Food Insecurity: No Food Insecurity (08/07/2023)   Hunger Vital Sign    Worried About Running Out of Food in the Last Year: Never true    Ran Out of Food in the Last Year: Never true  Transportation Needs: No Transportation Needs (08/07/2023)   PRAPARE - Administrator, Civil Service (Medical): No    Lack of Transportation (Non-Medical): No  Physical Activity: Not on file  Stress: Not on file  Social Connections: Not on file  Intimate Partner Violence: Not At Risk (08/07/2023)   Humiliation, Afraid, Rape, and Kick questionnaire    Fear of Current or Ex-Partner: No    Emotionally Abused: No    Physically Abused: No    Sexually Abused: No    FH:  Family History  Problem Relation Age of Onset   Heart failure Mother        Onset of heart failure 40s.  Died in 2022/04/30.   Diabetes Mother    Hypertension Father    Diabetes Father     Past Medical History:  Diagnosis Date    Acid reflux    Chronic HFrEF (heart failure with reduced ejection fraction) (HCC)    a. 10/2019 Echo: EF 35-40%, GrII DD; b. 05/2020 Echo: EF 20-25%, glob HK; c. 10/2020 Echo: EF 30-35%, glob HK. GrII DD, Mildly red RV fxn. Mod TR; d.  05/2021 cMRI: EF 42%, no LGE. Nl RV size/fxn.   CKD (chronic kidney disease), stage II    Diabetes mellitus (HCC)    H/O medication noncompliance    Hypertension    Microcytic anemia    NICM (nonischemic cardiomyopathy) (HCC)    a. 10/2019 Echo: EF 35-40%; b. 10/2019 MV: No ischemia. Small apical defect-->breast attenuation; c. 05/2020 Echo: EF 20-25%; d. 10/2020 Echo: EF 30-35%, glob HK. GrII DD, Mildly red RV fxn. Mod TR; e. 05/2021 cMRI: EF 42%, no LGE. Nl RV size/fxn.  Obesity    Polysubstance abuse (HCC)     Current Outpatient Medications  Medication Sig Dispense Refill   Accu-Chek Softclix Lancets lancets Use as instructed 100 each 12   carvedilol (COREG) 12.5 MG tablet Take 1 tablet (12.5 mg total) by mouth 2 (two) times daily with a meal. 60 tablet 1   Continuous Glucose Sensor (FREESTYLE LIBRE 3 SENSOR) MISC Place 1 sensor on the skin every 14 days. Use to check glucose continuously 2 each 12   dapagliflozin propanediol (FARXIGA) 10 MG TABS tablet Take 1 tablet (10 mg total) by mouth once daily. 30 tablet 1   digoxin (LANOXIN) 0.125 MG tablet Take 0.5 tablets (0.0625 mg total) by mouth daily. 30 tablet 1   sacubitril-valsartan (ENTRESTO) 97-103 MG Take 1 tablet by mouth 2 (two) times daily. 60 tablet 1   Semaglutide,0.25 or 0.5MG /DOS, (OZEMPIC, 0.25 OR 0.5 MG/DOSE,) 2 MG/3ML SOPN Inject 0.5 mg into the skin once a week. 3 mL 1   spironolactone (ALDACTONE) 25 MG tablet Take 1 tablet (25 mg total) by mouth daily. 30 tablet 1   torsemide (DEMADEX) 20 MG tablet Take 2 tablets (40 mg total) by mouth daily 60 tablet 1   No current facility-administered medications for this visit.   There were no vitals filed for this visit.  Wt Readings from Last 3 Encounters:   08/07/23 218 lb (98.9 kg)  07/07/23 218 lb (98.9 kg)  06/27/23 250 lb (113.4 kg)   Lab Results  Component Value Date   CREATININE 1.29 (H) 08/07/2023   CREATININE 1.44 (H) 08/06/2023   CREATININE 1.20 (H) 06/13/2023    PHYSICAL EXAM:  General: Well appearing. No resp difficulty HEENT: normal Neck: supple, no JVD Cor: Regular rhythm, rate. No rubs, gallops, II/VI systolic murmur LSB Lungs: clear Abdomen: soft, nontender, nondistended. Extremities: no cyanosis, clubbing, rash, edema Neuro: alert & oriented X 3. Moves all 4 extremities w/o difficulty. Affect pleasant   ECG: not done   ASSESSMENT & PLAN:  1: NICM with reduced ejection fraction- - suspect due to previous cocaine use and uncontrolled HTN  - NYHA class II - euvolemic - weighing daily; reminded to call for an overnight weight gain of > 2 pounds or a weekly weight gain of > 5 pounds - weight down 4 pounds from last visit here 1 month ago - Echo 06/30/21: EF  30-35% along with mild MR. - Echo 09/18/21: EF  25-30%.   - Echo 07/25/22: EF 25-30% with moderately elevated PA pressure of 50.7 mmHg, mild LAE, mild MR, small pericardial effusion and moderate/ severe TR - Cardiac MRI 05/19/21:    1. Mild LV dilatation, mild hypertrophy, and mild systolic   dysfunction (EF 42%)    2. No late gadolinium enhancement to suggest myocardial scar   3. Normal RV size and systolic function (EF 50%) - saw cardiology (Dunn) 11/24 - saw EP (Riddle) 02/25 - ICD to be implanted later this month - continue carvedilol 12.5mg  BID - continue farxiga 10mg  daily - continue digoxin 0.0625mg  daily; dig level 04/20/23 was 0.3 - continue entresto 97/103mg  BID  - continue spironolatone 25mg  daily - continue torsemide 40mg  daily - not a candidate for advanced therapies due to repeated + drug screens - BNP 04/14/23 was 1519.4  2: HTN- - BP 125/81 - saw PCP Angelica Chessman) 10/24 - BMP 05/16/23 reviewed and showed sodium 140, potassium 3.9, creatinine  1.10 & GFR >60 - to get labs drawn today for upcoming ICD implantation  3: DM- -  A1c 02/17/23 was 7.7% - continue ozempic 0.25mg  weekly   4: Anemia- - Hg 04/18/23 showed hemoglobin 8.6  5: Substance use- - no tobacco - no drug use since 07/24 - denies any recent alcohol use  Return in 3 months, sooner if needed.   Charlette Console, FNP 08/15/23
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
## 2023-08-15 NOTE — ED Provider Triage Note (Signed)
 Emergency Medicine Provider Triage Evaluation Note  Molly Howard , a 38 y.o. female  was evaluated in triage.  Pt complains of near syncope.  Review of Systems  Positive: dizziness Negative:   Physical Exam  LMP 08/05/2023 (Exact Date)  Gen:   Awake, no distress   Resp:  Normal effort  MSK:   Moves extremities without difficulty  Other:    Medical Decision Making  Medically screening exam initiated at 5:37 PM.  Appropriate orders placed.  Molly Howard was informed that the remainder of the evaluation will be completed by another provider, this initial triage assessment does not replace that evaluation, and the importance of remaining in the ED until their evaluation is complete.     Phyliss Breen, PA-C 08/15/23 1738
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
## 2023-08-15 NOTE — Assessment & Plan Note (Addendum)
 Nonischemic cardiomyopathy s/p AICD-last EF 30% BNP slightly elevated but clinically euvolemic Continue GDMT with Entresto, Aldactone, torsemide, digoxin, Coreg and Farxiga

## 2023-08-15 NOTE — ED Provider Notes (Signed)
 [facility] Provider Note    Event Date/Time   First MD Initiated Contact with Patient 08/15/23 2058     (approximate)   History   Near Syncope   HPI  Leslieann L Gambrill is a 38 y.o. female history of heart failure with reduced ejection fraction, CKD, diabetes, status post AICD, presenting with syncopal episode.  States that she was at a park, felt very lightheaded, states that she passed out for couple seconds on the bench.  Noted some intermittent chest pain but no shortness of breath, no leg swelling, states that she is been taking her medications.  States that her AICD was placed a month ago.  No cough, nausea, vomiting, diarrhea, urinary symptoms.  Patient has stated that it was been a long time since she has seen her cardiologist or got an echo done.  On admission chart review, she was admitted in early April for elevated troponin, had chest pain at the time that was thought to be musculoskeletal in nature, she does have history of nonischemic cardiomyopathy, on Entresto, Aldactone, Demadex, digoxin, Coreg and [place].  Had an echo that was done on 6 April that showed ejection fraction of 30 to 35%.     Physical Exam   Triage Vital Signs: ED Triage Vitals  Encounter Vitals Group     BP 08/15/23 1740 105/71     Systolic BP Percentile --      Diastolic BP Percentile --      Pulse Rate 08/15/23 1740 86     Resp 08/15/23 1740 19     Temp 08/15/23 1740 98.7 F (37.1 C)     Temp Source 08/15/23 1740 Oral     SpO2 08/15/23 1740 98 %     Weight --      Height --      Head Circumference --      Peak Flow --      Pain Score 08/15/23 1711 0     Pain Loc --      Pain Education --      Exclude from Growth Chart --     Most recent vital signs: Vitals:   08/15/23 2130 08/15/23 2211  BP: (!) 144/85   Pulse: 74   Resp: 19   Temp:  98.6 F (37 C)  SpO2: 98%      General: Awake, no distress.  CV:  Good peripheral perfusion.  AICD is located at her  left lateral chest, insertion site is intact, clean, dry, no overlying erythema Resp:  Normal effort.  Clear Abd:  No distention.  Nontender Other:  Trace lower extremity edema, no unilateral calf swelling or tenderness, dry mucous membranes   ED Results / Procedures / Treatments   Labs (all labs ordered are listed, but only abnormal results are displayed) Labs Reviewed  BASIC METABOLIC PANEL WITH GFR - Abnormal; Notable for the following components:      Result Value   Glucose, Bld 173 (*)    BUN 24 (*)    Creatinine, Ser 2.03 (*)    Calcium 7.3 (*)    GFR, Estimated 31 (*)    All other components within normal limits  CBC - Abnormal; Notable for the following components:   RBC 3.48 (*)    Hemoglobin 10.5 (*)    HCT 33.6 (*)    Platelets 446 (*)    All other components within normal limits  BRAIN NATRIURETIC PEPTIDE - Abnormal; Notable for the following components:  B Natriuretic Peptide 254.0 (*)    All other components within normal limits  URINALYSIS, W/ REFLEX TO CULTURE (INFECTION SUSPECTED) - Abnormal; Notable for the following components:   Color, Urine AMBER (*)    APPearance CLOUDY (*)    Glucose, UA >=500 (*)    Protein, ur >=300 (*)    Leukocytes,Ua TRACE (*)    Bacteria, UA RARE (*)    All other components within normal limits  PREGNANCY, URINE  POC URINE PREG, ED  CBG MONITORING, ED  TROPONIN I (HIGH SENSITIVITY)  TROPONIN I (HIGH SENSITIVITY)     EKG  Normal sinus rhythm, rate 85 normal QRS, normal QTc, T wave inversion to aVL, V5, V6, no ischemic ST elevation, mild lateral ST depressions, ST depressions and T wave changes are new compared to prior   RADIOLOGY Chest x-ray on my independent interpretation without obvious consolidation   PROCEDURES:  Critical Care performed: No  Procedures   MEDICATIONS ORDERED IN ED: Medications  lactated ringers bolus 500 mL (0 mLs Intravenous Stopped 08/15/23 2305)  fosfomycin (MONUROL) packet 3 g (3 g  Oral Given 08/15/23 2312)     IMPRESSION / MDM / ASSESSMENT AND PLAN / ED COURSE  I reviewed the triage vital signs and the nursing notes.                              Differential diagnosis includes, but is not limited to, dehydration, electrolyte derangements, cardiomyopathy, ACS.  Get labs, EKG, troponin, chest x-ray, IV fluids.  AICD interrogation  Patient's presentation is most consistent with acute presentation with potential threat to life or bodily function.  Independent review of labs and imaging below.  On reassessment patient still feels some lightheadedness, discussed with her about imaging and lab results.  She denies any cocaine or drug use, no alcohol use.  Patient states that she is compliant with her medications.  Given the new EKG changes, her lightheadedness and syncope earlier, UTI, I believe she should be admitted overnight for observation and serial troponins.  Consult hospitalist who is agreeable with plan for admission and will evaluate the patient.  She is admitted.  Clinical Course as of 08/15/23 2325  Mon Aug 15, 2023  2254 DG Chest 2 View No acute abnormality seen.  [TT]  2254 Independent review of labs, she has an AKI, electrolyte severely deranged, UA is consistent with UTI, no leukocytosis, troponin is not elevated, pregnancy test negative. [TT]    Clinical Course User Index [TT] Drenda Gentle Richard Champion, MD     FINAL CLINICAL IMPRESSION(S) / ED DIAGNOSES   Final diagnoses:  Syncope, unspecified syncope type  Dehydration  Urinary tract infection without hematuria, site unspecified  Chest pain, unspecified type     Rx / DC Orders   ED Discharge Orders     None        Note:  This document was prepared using Dragon voice recognition software and may include unintentional dictation errors.    Shane Darling, MD 08/15/23 [phone]

## 2023-08-15 NOTE — ED Triage Notes (Signed)
 Pt comes via [facility] from [place]. Pt got dizzy and felt she was going to pass out. Pt has recent pacemaker placed last month.  VSS

## 2023-08-15 NOTE — Assessment & Plan Note (Addendum)
 Mild AKI with creatinine 2.03 above baseline 1.29 Possibly related to diuretic therapy on torsemide 40 mg-2 tabs daily Received a fluid bolus in the ED Will decrease home torsemide once patient clinically euvolemic to dry Continue to monitor

## 2023-08-15 NOTE — Telephone Encounter (Signed)
 Called to confirm/remind patient of their appointment [facility] on 08/16/[facility].   Appointment:   [x] Confirmed  [] Left mess   [] No answer/No voice mail  [] Phone not in service  Patient reminded to bring all medications and/or complete list.  Confirmed patient has transportation. Gave directions, instructed to utilize valet parking.

## 2023-08-16 ENCOUNTER — Observation Stay

## 2023-08-16 ENCOUNTER — Encounter: Admitting: Family

## 2023-08-16 ENCOUNTER — Other Ambulatory Visit: Payer: Self-pay

## 2023-08-16 DIAGNOSIS — E860 Dehydration: Secondary | ICD-10-CM | POA: Diagnosis not present

## 2023-08-16 LAB — URINE DRUG SCREEN, QUALITATIVE (ARMC ONLY)
Amphetamines, Ur Screen: NOT DETECTED
Barbiturates, Ur Screen: NOT DETECTED
Benzodiazepine, Ur Scrn: NOT DETECTED
Cannabinoid 50 Ng, Ur ~~LOC~~: NOT DETECTED
Cocaine Metabolite,Ur ~~LOC~~: NOT DETECTED
MDMA (Ecstasy)Ur Screen: NOT DETECTED
Methadone Scn, Ur: NOT DETECTED
Opiate, Ur Screen: NOT DETECTED
Phencyclidine (PCP) Ur S: NOT DETECTED
Tricyclic, Ur Screen: NOT DETECTED

## 2023-08-16 LAB — TROPONIN I (HIGH SENSITIVITY): Troponin I (High Sensitivity): 15 ng/L (ref <18)

## 2023-08-16 LAB — CBG MONITORING, ED
Glucose-Capillary: 101 mg/dL — ABNORMAL HIGH (ref 70–99)
Glucose-Capillary: 103 mg/dL — ABNORMAL HIGH (ref 70–99)
Glucose-Capillary: 115 mg/dL — ABNORMAL HIGH (ref 70–99)
Glucose-Capillary: 115 mg/dL — ABNORMAL HIGH (ref 70–99)

## 2023-08-16 LAB — BASIC METABOLIC PANEL WITH GFR
Anion gap: 8 (ref 5–15)
BUN: 23 mg/dL — ABNORMAL HIGH (ref 6–20)
CO2: 26 mmol/L (ref 22–32)
Calcium: 7.4 mg/dL — ABNORMAL LOW (ref 8.9–10.3)
Chloride: 103 mmol/L (ref 98–111)
Creatinine, Ser: 1.39 mg/dL — ABNORMAL HIGH (ref 0.44–1.00)
GFR, Estimated: 50 mL/min — ABNORMAL LOW (ref >60)
Glucose, Bld: 113 mg/dL — ABNORMAL HIGH (ref 70–99)
Potassium: 4 mmol/L (ref 3.5–5.1)
Sodium: 137 mmol/L (ref 135–145)

## 2023-08-16 MED ORDER — SACUBITRIL-VALSARTAN 24-26 MG PO TABS: 1.0000 | ORAL_TABLET | Freq: Two times a day (BID) | ORAL | Status: DC

## 2023-08-16 MED ORDER — SACUBITRIL-VALSARTAN 24-26 MG PO TABS
1.0000 | ORAL_TABLET | Freq: Two times a day (BID) | ORAL | 2 refills | Status: DC
  Filled 2023-08-16: qty 60, 30d supply, fill #0
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
## 2023-08-16 NOTE — Progress Notes (Signed)
 Heart Failure Stewardship Pharmacy Note  PCP: Margarita Mail, DO PCP-Cardiologist: Debbe Odea, MD  HPI: Molly Howard is a 38 y.o. female with combined systolic and diastolic CHF, stage II chronic kidney disease, type 2 diabetes mellitus, hypertension, and nonischemic cardiomyopathy, GERD as well as polysubstance abuse with recent admission for injury to area surrounding ICD who presented after a syncopal episode. Previously reported difficulty with medication adherence and appears to have been out of medications for ~1 month, but reports taking medications as prescribed since hospital discharge. She at first denies feeling abnormal prior to syncope, then did report dizziness/lightheadedness after prompting. She also endorses shortness of breath. She has been alternating between staying in a motel and a park since discharge. On admission, BNP 254, HS-troponin was 15, UA not a clean catch evidenced by squamous epithelial cells, and UDS was negative. Chest x-ray noted no acute findings. CT head noted no acute issues, but remote left frontal and cerebella infarcts.   Pertinent Cardiac History: Noted to have CHF in 2021 where echo showed LVEF of 35-40%. Cardiac MRI in 05/2020 showed an EF of 42%, mild LV dilatation, RVEF 50%, with no delayed enhancement. Echo in 07/2022 with LVEF of 25 to 30%, mildly decreased RV systolic function, moderate to severe tricuspid regurgitation, and mild mitral regurgitation. RHC in 07/2022 with severely elevated RA pressure of 30, CO/CI low at 4.47/2, COP of 0.89, PAPI of 0.8,  and high PA pressures due to volume overload. Since, she has had multiple admissions for CHF. Admitted 02/09/22 due to acute on chronic HF. + cocaine. Admitted 07/24/22 due to decompensated HFrEF. Required milrinone drip and diuresed from 116 kg >> 91 kg. Admitted 11/05/22 due to swelling and fluid retention in arms and legs along with some central chest tightness and shortness of breath. Required  milrinone for diuresis. Zio patch 12/2022 with 24 VT runs with no sustained arrhythmias. Received subcutaneous ICD 06/2023. Echocardiogram 08/07/23 showed LVEF of 30-35%, mild LVH, low normal RV function, mild MR, mild-moderate TR, mild PR.   Pertinent Lab Values: Creat  Date Value Ref Range Status  12/14/2022 1.45 (H) 0.50 - 0.97 mg/dL Final   Creatinine, Ser  Date Value Ref Range Status  08/15/2023 2.03 (H) 0.44 - 1.00 mg/dL Final   BUN  Date Value Ref Range Status  08/15/2023 24 (H) 6 - 20 mg/dL Final  16/02/9603 15 6 - 20 mg/dL Final   Potassium  Date Value Ref Range Status  08/15/2023 4.3 3.5 - 5.1 mmol/L Final    Comment:    HEMOLYSIS AT THIS LEVEL MAY AFFECT RESULT   Sodium  Date Value Ref Range Status  08/15/2023 138 135 - 145 mmol/L Final  06/13/2023 142 134 - 144 mmol/L Final   B Natriuretic Peptide  Date Value Ref Range Status  08/15/2023 254.0 (H) 0.0 - 100.0 pg/mL Final    Comment:    Performed at [facility], [address] [place], [place] [address]   Magnesium  Date Value Ref Range Status  04/20/2023 2.1 1.7 - 2.4 mg/dL Final    Comment:    Performed at [facility], [address] [place], [place] [address]   Hgb A1c MFr Bld  Date Value Ref Range Status  08/06/2023 6.7 (H) 4.8 - 5.6 % Final    Comment:    (NOTE)         Prediabetes: 5.7 - 6.4         Diabetes: >6.4  Glycemic control for adults with diabetes: <7.0    Digoxin Level  Date Value Ref Range Status  04/20/2023 0.3 (L) 0.8 - 2.0 ng/mL Final    Comment:    Performed at [facility], [address] [place], [place] [address]   TSH  Date Value Ref Range Status  11/09/2021 5.114 (H) 0.350 - 4.500 uIU/mL Final    Comment:    Performed by a 3rd Generation assay with a functional sensitivity of <=0.01 uIU/mL. Performed at [facility], [address] [place], [place] [address]    LDH  Date Value Ref Range Status  04/15/2023 216 (H)  98 - 192 U/L Final    Comment:    Performed at [facility], [address] [place], [place] [address]    Vital Signs:  Temp:  [98.2 F (36.8 C)-98.7 F (37.1 C)] 98.2 F (36.8 C) (04/15 0545) Pulse Rate:  [70-86] 77 (04/15 0800) Cardiac Rhythm: Normal sinus rhythm (04/15 0032) Resp:  [16-19] 16 (04/15 0800) BP: (105-148)/(71-105) 148/105 (04/15 0800) SpO2:  [92 %-98 %] 96 % (04/15 0800) Weight:  [95.3 kg (210 lb)] 95.3 kg (210 lb) (04/14 2203) No intake or output data in the 24 hours ending 08/16/23 0945  Current Heart Failure Medications:  Loop diuretic: torsemide 40 mg daily Beta-Blocker: carvedilol 12.5 mg BID  ACEI/ARB/ARNI: Entresto 97-103 mg BID MRA: spironolactone 25 mg daily SGLT2i: Farxiga 10 mg daily Other: digoxin 0.0625 mg daily  Prior to admission Heart Failure Medications:  Out of medications for ~1 month Loop diuretic: torsemide 40 mg daily Beta-Blocker: carvedilol 12.5 mg BID  ACEI/ARB/ARNI: Entresto 97-103 mg BID MRA: spironolactone 25 mg daily SGLT2i: Farxiga 10 mg daily Other: digoxin 0.0625 mg daily  Assessment: 1. Acute on chronic systolic heart failure (LVEF 30-35%) with low normal RV function, due to NICM. NYHA class II symptoms.  -Symptoms: Reports mild dyspnea with exertion. Reports occasional forgetfulness. Denies LEE. Reports poor appetite.  -Volume: Urine output not charted. Home torsemide 40 mg daily reduced to 20 mg daily. Creatinine up from baseline on admission. No LEE. Would consider REDS to direct diuresis.  -Hemodynamics: BP lower on admission, despite missing Entresto yesterday. HR 70s.  -BB: Continue carvedilol 12.5 mg daily.  -ACEI/ARB/ARNI: Consider decreasing Entresto to 24-26 mg BID given lightheadedness and lower BP on admission. Received 97-103 mg this AM, will monitor response. Can increase slowly as tolerated.  -MRA: Continue spironolactone 25 mg daily.  -SGLT2i: Continue Farxiga 10 mg daily  Plan: 1) Medication  changes recommended at this time: -Consider decreasing Entresto to 24-26 mg BID  2) Patient assistance: -Copays are $4  3) Education: - Patient has been educated on current HF medications and potential additions to HF medication regimen - Patient verbalizes understanding that over the next few months, these medication doses may change and more medications may be added to optimize HF regimen - Patient has been educated on basic disease state pathophysiology and goals of therapy  Medication Assistance / Insurance Benefits Check: Does the patient have prescription insurance?    Type of insurance plan:  Does the patient qualify for medication assistance through manufacturers or grants? No  Outpatient Pharmacy: Prior to admission outpatient pharmacy: [facility]      Please do not hesitate to reach out with questions or concerns,  Enos Fling, PharmD, CPP, BCPS Heart Failure Pharmacist  Phone - [phone] 08/16/2023 9:45 AM

## 2023-08-16 NOTE — Progress Notes (Signed)
 PT Cancellation Note  Patient Details Name: [name] MRN: [id number] DOB: [birth year]   Cancelled Treatment:    Reason Eval/Treat Not Completed: Other (comment). Chart reviewed, pt ambulatory and no PT needs. Listed at level 6 by nursing, confirmed by bedside nurse.   Oreta Soloway 08/16/2023, 11:17 AM Amparo Balk, PT, DPT, GCS [phone]

## 2023-08-16 NOTE — ED Notes (Signed)
 Toiletries given to patient at this time

## 2023-08-23 ENCOUNTER — Telehealth: Payer: Self-pay | Admitting: Family

## 2023-08-23 NOTE — Telephone Encounter (Signed)
 Called to confirm/remind patient of their appointment at [facility] on 08/24/23.   Appointment:   [x] Confirmed  [] Left mess   [] No answer/No voice mail  [] VM Full/unable to leave message  [] Phone not in service  Patient reminded to bring all medications and/or complete list.  Confirmed patient has transportation. Gave directions, instructed to utilize valet parking.

## 2023-08-23 NOTE — Progress Notes (Deleted)
 [facility] Note   PCP: Molly Cid, DO (last seen 10/24) Primary Cardiologist: Molly Delton, MD/ Molly Gentleman, PA (last seen 11/24)  Chief Complaint: shortness of breath   HPI:  [name] is a 38 y/o female with a history of DM, HTN, previous drug use, NSVT, PSVT, mod/severe TR, CKD, anemia and chronic biventricular heart failure.   Admitted 02/09/22 due to acute on chronic HF. + cocaine. Discharged after 7 days. Admitted 07/24/22 due to decompensated HFrEF. Received IV Lasix . Was on milrinone  drip. Digoxin  added. Uop about 41 liters with decrease in weight 116 kg >>91 kg. Admitted 11/05/22 due to swelling and fluid retention in arms and legs along with some central chest tightness and shortness of breath and all has been going on for about a week. Needed lasix  gtt with milrinone  and diuresing well,  total UOP >40 liters. Weight significantly down 130 kg-->97 kg. Weaned off of IV drips. Spironolactone  stopped due to hyperkalemia. Urine drug screen + for cocaine. Negative for dvt/stenosis. X-ray nothing acute to explain left arm swelling.    Admitted 04/15/23 due to worsening dyspnea with associated bilateral lower extremity edema. Had orthopnea and PND. Had not been on her diuretic because she had gotten kicked out of the house and was unable to go back for her medications. IV diuresed. Advanced HF team consulted. Venofer  given for anemia. ICD deferred due to HF exacerbation.   She presents today for a HF follow-up visit with a chief complaint of minimal shortness of breath. Has associated fatigue, occasional chest pain, palpitations and dizziness after medications along with this. Denies cough, abdominal distention, pedal edema, weight gain or difficulty sleeping. Tolerating ozempic  although does get nauseous if she eats too much or too much fried foods. ICD to be implanted 06/27/23.  Drinks ~ 32 oz soda and 32 oz water daily. No further alcohol. No drug use since  07/24. Currently living with some friends at their house and says this is much better than the [employer] she was living in previously.    Echo 10/11/20: EF 30-35% along with moderate TR.  Echo 03/20/21: EF 25-30% along with severely elevated PA pressure of 70.8 mmHg, mild LAE, mild MR and mild/moderate TR Echo 06/30/21: EF  30-35% along with mild MR. Echo 09/18/21: EF  25-30%.   Echo 07/25/22: EF 25-30% with moderately elevated PA pressure of 50.7 mmHg, mild LAE, mild MR, small pericardial effusion and moderate/ severe TR  Cardiac MRI 05/19/21:  1. Mild LV dilatation, mild hypertrophy, and mild systolic dysfunction (EF 42%)  2.  No late gadolinium enhancement to suggest myocardial scar  3.  Normal RV size and systolic function (EF 50%)  RHC 07/28/22: Severely elevated right and left-sided filling pressures, moderate pulmonary hypertension and moderately reduced cardiac output. RA: 30 mmHg RV: 56/14 mmHg PW: 34 mmHg PA: 54/30 with a mean of 41 mmHg PA sat is 42% Cardiac output: 4.47 with a cardiac index of 2. CPO is 0.89 PAPI: 0.8  Zio patch 12/2022: Patient had a min HR of 62 bpm, max HR of 187 bpm, and avg HR of 76 bpm. Predominant underlying rhythm was Sinus Rhythm. 24 Ventricular Tachycardia runs occurred, the run with the fastest interval lasting 7 beats with a max rate of 187 bpm, the longest lasting 13 beats with an avg rate of 128 bpm. 1 run of Supraventricular Tachycardia occurred lasting 6 beats with a max rate of 135 bpm (avg 116 bpm). Isolated SVEs were rare (<1.0%), SVE  Couplets were rare (<1.0%), and SVE Triplets were rare (<1.0%). Isolated VEs were rare (<1.0%, 1371), VE Couplets were rare (<1.0%, 120), and VE Triplets were rare (<1.0%, 26). Ventricular Bigeminy was present.  Conclusion Occasional nonsustained VT. 1 episode of paroxysmal SVT. No sustained arrhythmias. No atrial fibrillation or atrial flutter.  ROS: All systems negative except as listed in HPI, PMH and Problem  List.  SH:  Social History   Socioeconomic History   Marital status: Single    Spouse name: Not on file   Number of children: 0   Years of education: Not on file   Highest education level: 12th grade  Occupational History   Occupation: Diaabled  Tobacco Use   Smoking status: Former    Current packs/day: 0.00    Types: Cigarettes    Quit date: 2022    Years since quitting: 3.3   Smokeless tobacco: Never  Vaping Use   Vaping status: Never Used  Substance and Sexual Activity   Alcohol use: Not Currently   Drug use: Not Currently    Types: Cocaine    Comment: Admits to using cocaine up and will July 2024.   Sexual activity: Not Currently    Birth control/protection: None  Other Topics Concern   Not on file  Social History Narrative   Lives locally.  Does not routinely exercise.  Has been using cocaine.   Social Drivers of Corporate investment banker Strain: High Risk (04/15/2023)   Overall Financial Resource Strain (CARDIA)    Difficulty of Paying Living Expenses: Very hard  Food Insecurity: No Food Insecurity (08/16/2023)   Hunger Vital Sign    Worried About Running Out of Food in the Last Year: Never true    Ran Out of Food in the Last Year: Never true  Transportation Needs: No Transportation Needs (08/16/2023)   PRAPARE - Administrator, Civil Service (Medical): No    Lack of Transportation (Non-Medical): No  Physical Activity: Not on file  Stress: Not on file  Social Connections: Unknown (08/16/2023)   Social Connection and Isolation Panel [NHANES]    Frequency of Communication with Friends and Family: Not on file    Frequency of Social Gatherings with Friends and Family: Not on file    Attends Religious Services: Not on file    Active Member of Clubs or Organizations: Not on file    Attends Banker Meetings: Not on file    Marital Status: Never married  Intimate Partner Violence: Not At Risk (08/16/2023)   Humiliation, Afraid, Rape, and  Kick questionnaire    Fear of Current or Ex-Partner: No    Emotionally Abused: No    Physically Abused: No    Sexually Abused: No    FH:  Family History  Problem Relation Age of Onset   Heart failure Mother        Onset of heart failure 40s.  Died in 18-May-2022.   Diabetes Mother    Hypertension Father    Diabetes Father     Past Medical History:  Diagnosis Date   Acid reflux    Chronic HFrEF (heart failure with reduced ejection fraction) (HCC)    a. 10/2019 Echo: EF 35-40%, GrII DD; b. 05/2020 Echo: EF 20-25%, glob HK; c. 10/2020 Echo: EF 30-35%, glob HK. GrII DD, Mildly red RV fxn. Mod TR; d.  05/2021 cMRI: EF 42%, no LGE. Nl RV size/fxn.   CKD (chronic kidney disease), stage II    Diabetes  mellitus (HCC)    H/O medication noncompliance    Hypertension    Microcytic anemia    NICM (nonischemic cardiomyopathy) (HCC)    a. 10/2019 Echo: EF 35-40%; b. 10/2019 MV: No ischemia. Small apical defect-->breast attenuation; c. 05/2020 Echo: EF 20-25%; d. 10/2020 Echo: EF 30-35%, glob HK. GrII DD, Mildly red RV fxn. Mod TR; e. 05/2021 cMRI: EF 42%, no LGE. Nl RV size/fxn.   Obesity    Polysubstance abuse (HCC)     Current Outpatient Medications  Medication Sig Dispense Refill   Accu-Chek Softclix Lancets lancets Use as instructed 100 each 12   carvedilol  (COREG ) 12.5 MG tablet Take 1 tablet (12.5 mg total) by mouth 2 (two) times daily with a meal. 60 tablet 1   Continuous Glucose Sensor (FREESTYLE LIBRE 3 SENSOR) MISC Place 1 sensor on the skin every 14 days. Use to check glucose continuously 2 each 12   dapagliflozin  propanediol (FARXIGA ) 10 MG TABS tablet Take 1 tablet (10 mg total) by mouth once daily. 30 tablet 1   digoxin  (LANOXIN ) 0.125 MG tablet Take 0.5 tablets (0.0625 mg total) by mouth daily. 30 tablet 1   sacubitril -valsartan  (ENTRESTO ) 24-26 MG Take 1 tablet by mouth 2 (two) times daily. 60 tablet 2   Semaglutide ,0.25 or 0.5MG /DOS, (OZEMPIC , 0.25 OR 0.5 MG/DOSE,) 2 MG/3ML SOPN  Inject 0.5 mg into the skin once a week. 3 mL 1   spironolactone  (ALDACTONE ) 25 MG tablet Take 1 tablet (25 mg total) by mouth daily. 30 tablet 1   torsemide  (DEMADEX ) 20 MG tablet Take 2 tablets (40 mg total) by mouth daily 60 tablet 1   No current facility-administered medications for this visit.   There were no vitals filed for this visit.  Wt Readings from Last 3 Encounters:  08/15/23 210 lb (95.3 kg)  08/07/23 218 lb (98.9 kg)  07/07/23 218 lb (98.9 kg)   Lab Results  Component Value Date   CREATININE 1.39 (H) 08/16/2023   CREATININE 2.03 (H) 08/15/2023   CREATININE 1.29 (H) 08/07/2023    PHYSICAL EXAM:  General: Well appearing. No resp difficulty HEENT: normal Neck: supple, no JVD Cor: Regular rhythm, rate. No rubs, gallops, II/VI systolic murmur LSB Lungs: clear Abdomen: soft, nontender, nondistended. Extremities: no cyanosis, clubbing, rash, edema Neuro: alert & oriented X 3. Moves all 4 extremities w/o difficulty. Affect pleasant   ECG: not done   ASSESSMENT & PLAN:  1: NICM with reduced ejection fraction- - suspect due to previous cocaine use and uncontrolled HTN  - NYHA class II - euvolemic - weighing daily; reminded to call for an overnight weight gain of > 2 pounds or a weekly weight gain of > 5 pounds - weight down 4 pounds from last visit here 1 month ago - Echo 06/30/21: EF  30-35% along with mild MR. - Echo 09/18/21: EF  25-30%.   - Echo 07/25/22: EF 25-30% with moderately elevated PA pressure of 50.7 mmHg, mild LAE, mild MR, small pericardial effusion and moderate/ severe TR - Cardiac MRI 05/19/21:    1. Mild LV dilatation, mild hypertrophy, and mild systolic   dysfunction (EF 42%)    2. No late gadolinium enhancement to suggest myocardial scar   3. Normal RV size and systolic function (EF 50%) - saw cardiology (Molly Howard) 11/24 - saw EP (Molly Howard) 02/25 - ICD to be implanted later this month - continue carvedilol  12.5mg  BID - continue farxiga  10mg   daily - continue digoxin  0.0625mg  daily; dig level 04/20/23 was 0.3 -  continue entresto  97/103mg  BID  - continue spironolatone 25mg  daily - continue torsemide  40mg  daily - not a candidate for advanced therapies due to repeated + drug screens - BNP 04/14/23 was 1519.4  2: HTN- - BP 125/81 - saw PCP Molly Howard) 10/24 - BMP 05/16/23 reviewed and showed sodium 140, potassium 3.9, creatinine 1.10 & GFR >60 - to get labs drawn today for upcoming ICD implantation  3: DM- - A1c 02/17/23 was 7.7% - continue ozempic  0.25mg  weekly   4: Anemia- - Hg 04/18/23 showed hemoglobin 8.6  5: Substance use- - no tobacco - no drug use since 07/24 - denies any recent alcohol use  Return in 3 months, sooner if needed.   Charlette Console, FNP 08/23/23

## 2023-08-24 ENCOUNTER — Encounter: Payer: Self-pay | Admitting: Family

## 2023-08-25 ENCOUNTER — Telehealth: Payer: Self-pay | Admitting: Family

## 2023-08-25 ENCOUNTER — Telehealth (HOSPITAL_COMMUNITY): Payer: Self-pay | Admitting: Licensed Clinical Social Worker

## 2023-08-25 NOTE — Telephone Encounter (Signed)
 CSW consulted to speak with pt regarding current concerns with homelessness and lack of transportation.  CSW attempted to call but unable to reach- left VM requesting return call  [name], LCSW Clinical Social Worker [facility] Desk#: [phone] Cell#: [phone]

## 2023-08-25 NOTE — Telephone Encounter (Signed)
 Called to confirm/remind patient of their appointment at the [facility] on 08/26/23.   Appointment:   [x] Confirmed  [] Left mess   [] No answer/No voice mail  [] VM Full/unable to leave message  [] Phone not in service  Patient reminded to bring all medications and/or complete list.  Confirmed patient has transportation. Gave directions, instructed to utilize valet parking.
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
## 2023-08-26 ENCOUNTER — Ambulatory Visit (HOSPITAL_BASED_OUTPATIENT_CLINIC_OR_DEPARTMENT_OTHER): Payer: Self-pay | Admitting: Family

## 2023-08-26 ENCOUNTER — Encounter: Payer: Self-pay | Admitting: Family

## 2023-08-26 ENCOUNTER — Other Ambulatory Visit: Payer: Self-pay | Admitting: Family

## 2023-08-26 ENCOUNTER — Ambulatory Visit
Admission: RE | Admit: 2023-08-26 | Discharge: 2023-08-26 | Disposition: A | Discharge status: Home / Self Care | Source: Ambulatory Visit | Attending: Family | Admitting: Family

## 2023-08-26 ENCOUNTER — Telehealth (HOSPITAL_COMMUNITY): Payer: Self-pay | Admitting: Licensed Clinical Social Worker

## 2023-08-26 VITALS — BP 154/94 | HR 94 | Wt 223.0 lb

## 2023-08-26 DIAGNOSIS — N182 Chronic kidney disease, stage 2 (mild): Secondary | ICD-10-CM | POA: Insufficient documentation

## 2023-08-26 DIAGNOSIS — Z87891 Personal history of nicotine dependence: Secondary | ICD-10-CM | POA: Diagnosis not present

## 2023-08-26 DIAGNOSIS — R0602 Shortness of breath: Secondary | ICD-10-CM | POA: Diagnosis present

## 2023-08-26 DIAGNOSIS — I5020 Unspecified systolic (congestive) heart failure: Secondary | ICD-10-CM

## 2023-08-26 DIAGNOSIS — I361 Nonrheumatic tricuspid (valve) insufficiency: Secondary | ICD-10-CM | POA: Diagnosis not present

## 2023-08-26 DIAGNOSIS — F1910 Other psychoactive substance abuse, uncomplicated: Secondary | ICD-10-CM

## 2023-08-26 DIAGNOSIS — E1122 Type 2 diabetes mellitus with diabetic chronic kidney disease: Secondary | ICD-10-CM | POA: Insufficient documentation

## 2023-08-26 DIAGNOSIS — I10 Essential (primary) hypertension: Secondary | ICD-10-CM

## 2023-08-26 DIAGNOSIS — D509 Iron deficiency anemia, unspecified: Secondary | ICD-10-CM | POA: Diagnosis not present

## 2023-08-26 DIAGNOSIS — Z5902 Unsheltered homelessness: Secondary | ICD-10-CM | POA: Insufficient documentation

## 2023-08-26 DIAGNOSIS — I130 Hypertensive heart and chronic kidney disease with heart failure and stage 1 through stage 4 chronic kidney disease, or unspecified chronic kidney disease: Secondary | ICD-10-CM | POA: Insufficient documentation

## 2023-08-26 DIAGNOSIS — Z5900 Homelessness unspecified: Secondary | ICD-10-CM

## 2023-08-26 DIAGNOSIS — N189 Chronic kidney disease, unspecified: Secondary | ICD-10-CM | POA: Diagnosis not present

## 2023-08-26 DIAGNOSIS — I4729 Other ventricular tachycardia: Secondary | ICD-10-CM | POA: Insufficient documentation

## 2023-08-26 DIAGNOSIS — I428 Other cardiomyopathies: Secondary | ICD-10-CM | POA: Diagnosis not present

## 2023-08-26 DIAGNOSIS — Z7984 Long term (current) use of oral hypoglycemic drugs: Secondary | ICD-10-CM | POA: Diagnosis not present

## 2023-08-26 DIAGNOSIS — I5082 Biventricular heart failure: Secondary | ICD-10-CM | POA: Diagnosis not present

## 2023-08-26 DIAGNOSIS — I5022 Chronic systolic (congestive) heart failure: Secondary | ICD-10-CM | POA: Diagnosis not present

## 2023-08-26 DIAGNOSIS — E1129 Type 2 diabetes mellitus with other diabetic kidney complication: Secondary | ICD-10-CM

## 2023-08-26 DIAGNOSIS — D631 Anemia in chronic kidney disease: Secondary | ICD-10-CM | POA: Diagnosis not present

## 2023-08-26 DIAGNOSIS — Z7985 Long-term (current) use of injectable non-insulin antidiabetic drugs: Secondary | ICD-10-CM | POA: Insufficient documentation

## 2023-08-26 LAB — BRAIN NATRIURETIC PEPTIDE: B Natriuretic Peptide: 1211.1 pg/mL — ABNORMAL HIGH (ref 0.0–100.0)

## 2023-08-26 LAB — BASIC METABOLIC PANEL WITH GFR
Anion gap: 4 — ABNORMAL LOW (ref 5–15)
BUN: 28 mg/dL — ABNORMAL HIGH (ref 6–20)
CO2: 23 mmol/L (ref 22–32)
Calcium: 7.7 mg/dL — ABNORMAL LOW (ref 8.9–10.3)
Chloride: 106 mmol/L (ref 98–111)
Creatinine, Ser: 1.33 mg/dL — ABNORMAL HIGH (ref 0.44–1.00)
GFR, Estimated: 52 mL/min — ABNORMAL LOW (ref >60)
Glucose, Bld: 168 mg/dL — ABNORMAL HIGH (ref 70–99)
Potassium: 4.2 mmol/L (ref 3.5–5.1)
Sodium: 133 mmol/L — ABNORMAL LOW (ref 135–145)

## 2023-08-26 MED ORDER — FUROSEMIDE 10 MG/ML IJ SOLN
80.0000 mg | Freq: Once | INTRAMUSCULAR | Status: AC
Start: 2023-08-26 — End: 2023-08-26
  Administered 2023-08-26: 80 mg via INTRAVENOUS

## 2023-08-26 MED ORDER — FUROSEMIDE 10 MG/ML IJ SOLN
INTRAMUSCULAR | Status: AC
  Filled 2023-08-26: qty 8

## 2023-08-26 MED ORDER — POTASSIUM CHLORIDE CRYS ER 20 MEQ PO TBCR
40.0000 meq | EXTENDED_RELEASE_TABLET | Freq: Once | ORAL | Status: AC
Start: 2023-08-26 — End: 2023-08-26
  Administered 2023-08-26: 40 meq via ORAL

## 2023-08-26 MED ORDER — POTASSIUM CHLORIDE CRYS ER 20 MEQ PO TBCR
EXTENDED_RELEASE_TABLET | ORAL | Status: AC
  Filled 2023-08-26: qty 2

## 2023-08-26 NOTE — Telephone Encounter (Signed)
 H&V Care Navigation CSW Progress Note  Clinical Social Worker called pt to remind of ride to clinic today for appt- confirms she remembers and will be at [facility] waiting.  CSW able to help get some food for patient through [employer] which she will pick up following her appt.  Patient is participating in a Managed Medicaid Plan:  Yes  SDOH Screenings   Food Insecurity: Food Insecurity Present (08/25/2023)  Housing: High Risk (08/16/2023)  Transportation Needs: Unmet Transportation Needs (08/25/2023)  Utilities: Not At Risk (08/16/2023)  Alcohol Screen: Low Risk  (04/15/2023)  Depression (PHQ2-9): Low Risk  (03/02/2023)  Financial Resource Strain: High Risk (08/25/2023)  Social Connections: Unknown (08/16/2023)  Tobacco Use: Medium Risk (08/15/2023)   [name], LCSW Clinical Social Worker [facility] Desk#: [phone] Cell#: [phone]

## 2023-08-26 NOTE — Patient Instructions (Signed)
 Medication Changes:  Please report to same day surgery at 2pm in order to receive your IV Lasix   Lab Work:  Lab work will be done at the 2pm appointment   Follow-Up in: Please follow up with the [facility] in 1 week with Shawnee Dellen, FNP.  At the [facility], you and your health needs are our priority. We have a designated team specialized in the treatment of Heart Failure. This Care Team includes your primary Heart Failure Specialized Cardiologist (physician), Advanced Practice Providers (APPs- Physician Assistants and Nurse Practitioners), and Pharmacist who all work together to provide you with the care you need, when you need it.   You may see any of the following providers on your designated Care Team at your next follow up:  Dr. Jules Oar Dr. Peder Bourdon Dr. Alwin Baars Dr. Judyth Nunnery Shawnee Dellen, FNP Bevely Brush, RPH-CPP  Please be sure to bring in all your medications bottles to every appointment.   Need to Contact Us :  If you have any questions or concerns before your next appointment please send us  a message through [place] or call our office at [phone].    TO LEAVE A MESSAGE FOR THE NURSE SELECT OPTION 2, PLEASE LEAVE A MESSAGE INCLUDING: YOUR NAME DATE OF BIRTH CALL BACK NUMBER REASON FOR CALL**this is important as we prioritize the call backs  YOU WILL RECEIVE A CALL BACK THE SAME DAY AS LONG AS YOU CALL BEFORE 4:00 PM

## 2023-08-28 ENCOUNTER — Other Ambulatory Visit: Payer: Self-pay

## 2023-08-28 ENCOUNTER — Inpatient Hospital Stay
Admission: EM | Admit: 2023-08-28 | Discharge: 2023-09-01 | DRG: 291 | Disposition: A | Discharge status: Home / Self Care | Attending: Internal Medicine | Admitting: Internal Medicine

## 2023-08-28 ENCOUNTER — Encounter: Payer: Self-pay | Admitting: Emergency Medicine

## 2023-08-28 ENCOUNTER — Emergency Department

## 2023-08-28 DIAGNOSIS — E119 Type 2 diabetes mellitus without complications: Secondary | ICD-10-CM

## 2023-08-28 DIAGNOSIS — I428 Other cardiomyopathies: Secondary | ICD-10-CM | POA: Diagnosis present

## 2023-08-28 DIAGNOSIS — N179 Acute kidney failure, unspecified: Secondary | ICD-10-CM | POA: Diagnosis present

## 2023-08-28 DIAGNOSIS — Z87891 Personal history of nicotine dependence: Secondary | ICD-10-CM

## 2023-08-28 DIAGNOSIS — Z6833 Body mass index (BMI) 33.0-33.9, adult: Secondary | ICD-10-CM

## 2023-08-28 DIAGNOSIS — N182 Chronic kidney disease, stage 2 (mild): Secondary | ICD-10-CM | POA: Diagnosis present

## 2023-08-28 DIAGNOSIS — F1910 Other psychoactive substance abuse, uncomplicated: Secondary | ICD-10-CM | POA: Diagnosis present

## 2023-08-28 DIAGNOSIS — K219 Gastro-esophageal reflux disease without esophagitis: Secondary | ICD-10-CM | POA: Diagnosis present

## 2023-08-28 DIAGNOSIS — E1122 Type 2 diabetes mellitus with diabetic chronic kidney disease: Secondary | ICD-10-CM | POA: Diagnosis present

## 2023-08-28 DIAGNOSIS — Z7985 Long-term (current) use of injectable non-insulin antidiabetic drugs: Secondary | ICD-10-CM

## 2023-08-28 DIAGNOSIS — I5023 Acute on chronic systolic (congestive) heart failure: Principal | ICD-10-CM | POA: Diagnosis present

## 2023-08-28 DIAGNOSIS — E871 Hypo-osmolality and hyponatremia: Secondary | ICD-10-CM | POA: Insufficient documentation

## 2023-08-28 DIAGNOSIS — E1169 Type 2 diabetes mellitus with other specified complication: Secondary | ICD-10-CM

## 2023-08-28 DIAGNOSIS — Z91018 Allergy to other foods: Secondary | ICD-10-CM

## 2023-08-28 DIAGNOSIS — Z95810 Presence of automatic (implantable) cardiac defibrillator: Secondary | ICD-10-CM | POA: Diagnosis not present

## 2023-08-28 DIAGNOSIS — J441 Chronic obstructive pulmonary disease with (acute) exacerbation: Secondary | ICD-10-CM

## 2023-08-28 DIAGNOSIS — F1410 Cocaine abuse, uncomplicated: Secondary | ICD-10-CM | POA: Diagnosis present

## 2023-08-28 DIAGNOSIS — Z7984 Long term (current) use of oral hypoglycemic drugs: Secondary | ICD-10-CM

## 2023-08-28 DIAGNOSIS — T380X5A Adverse effect of glucocorticoids and synthetic analogues, initial encounter: Secondary | ICD-10-CM | POA: Diagnosis present

## 2023-08-28 DIAGNOSIS — I10 Essential (primary) hypertension: Secondary | ICD-10-CM | POA: Diagnosis present

## 2023-08-28 DIAGNOSIS — Z833 Family history of diabetes mellitus: Secondary | ICD-10-CM

## 2023-08-28 DIAGNOSIS — E669 Obesity, unspecified: Secondary | ICD-10-CM | POA: Diagnosis present

## 2023-08-28 DIAGNOSIS — E66811 Obesity, class 1: Secondary | ICD-10-CM | POA: Insufficient documentation

## 2023-08-28 DIAGNOSIS — Z79899 Other long term (current) drug therapy: Secondary | ICD-10-CM

## 2023-08-28 DIAGNOSIS — E1165 Type 2 diabetes mellitus with hyperglycemia: Secondary | ICD-10-CM | POA: Diagnosis present

## 2023-08-28 DIAGNOSIS — Z5900 Homelessness unspecified: Secondary | ICD-10-CM

## 2023-08-28 DIAGNOSIS — I5021 Acute systolic (congestive) heart failure: Secondary | ICD-10-CM | POA: Diagnosis present

## 2023-08-28 DIAGNOSIS — I130 Hypertensive heart and chronic kidney disease with heart failure and stage 1 through stage 4 chronic kidney disease, or unspecified chronic kidney disease: Principal | ICD-10-CM | POA: Diagnosis present

## 2023-08-28 DIAGNOSIS — Z8249 Family history of ischemic heart disease and other diseases of the circulatory system: Secondary | ICD-10-CM

## 2023-08-28 LAB — MAGNESIUM: Magnesium: 1.9 mg/dL (ref 1.7–2.4)

## 2023-08-28 LAB — CBC WITH DIFFERENTIAL/PLATELET
Abs Immature Granulocytes: 0.02 10*3/uL (ref 0.00–0.07)
Basophils Absolute: 0.1 10*3/uL (ref 0.0–0.1)
Basophils Relative: 1 %
Eosinophils Absolute: 0.4 10*3/uL (ref 0.0–0.5)
Eosinophils Relative: 6 %
HCT: 33 % — ABNORMAL LOW (ref 36.0–46.0)
Hemoglobin: 10.4 g/dL — ABNORMAL LOW (ref 12.0–15.0)
Immature Granulocytes: 0 %
Lymphocytes Relative: 13 %
Lymphs Abs: 0.8 10*3/uL (ref 0.7–4.0)
MCH: 30.7 pg (ref 26.0–34.0)
MCHC: 31.5 g/dL (ref 30.0–36.0)
MCV: 97.3 fL (ref 80.0–100.0)
Monocytes Absolute: 0.6 10*3/uL (ref 0.1–1.0)
Monocytes Relative: 10 %
Neutro Abs: 4 10*3/uL (ref 1.7–7.7)
Neutrophils Relative %: 70 %
Platelets: 291 10*3/uL (ref 150–400)
RBC: 3.39 MIL/uL — ABNORMAL LOW (ref 3.87–5.11)
RDW: 14.5 % (ref 11.5–15.5)
WBC: 5.7 10*3/uL (ref 4.0–10.5)
nRBC: 0 % (ref 0.0–0.2)

## 2023-08-28 LAB — TROPONIN I (HIGH SENSITIVITY)
Troponin I (High Sensitivity): 23 ng/L — ABNORMAL HIGH (ref <18)
Troponin I (High Sensitivity): 22 ng/L — ABNORMAL HIGH (ref <18)

## 2023-08-28 LAB — URINE DRUG SCREEN, QUALITATIVE (ARMC ONLY)
Amphetamines, Ur Screen: NOT DETECTED
Barbiturates, Ur Screen: NOT DETECTED
Benzodiazepine, Ur Scrn: NOT DETECTED
Cannabinoid 50 Ng, Ur ~~LOC~~: NOT DETECTED
Cocaine Metabolite,Ur ~~LOC~~: NOT DETECTED
MDMA (Ecstasy)Ur Screen: NOT DETECTED
Methadone Scn, Ur: NOT DETECTED
Opiate, Ur Screen: NOT DETECTED
Phencyclidine (PCP) Ur S: NOT DETECTED
Tricyclic, Ur Screen: NOT DETECTED

## 2023-08-28 LAB — URINALYSIS, ROUTINE W REFLEX MICROSCOPIC
Bilirubin Urine: NEGATIVE
Glucose, UA: NEGATIVE mg/dL
Ketones, ur: NEGATIVE mg/dL
Nitrite: NEGATIVE
Protein, ur: >=300 mg/dL — AB
Specific Gravity, Urine: 1.012 (ref 1.005–1.030)
pH: 5 (ref 5.0–8.0)

## 2023-08-28 LAB — BASIC METABOLIC PANEL WITH GFR
Anion gap: 4 — ABNORMAL LOW (ref 5–15)
BUN: 46 mg/dL — ABNORMAL HIGH (ref 6–20)
CO2: 23 mmol/L (ref 22–32)
Calcium: 8.2 mg/dL — ABNORMAL LOW (ref 8.9–10.3)
Chloride: 105 mmol/L (ref 98–111)
Creatinine, Ser: 1.08 mg/dL — ABNORMAL HIGH (ref 0.44–1.00)
GFR, Estimated: >60 mL/min (ref >60)
Glucose, Bld: 105 mg/dL — ABNORMAL HIGH (ref 70–99)
Potassium: 4.4 mmol/L (ref 3.5–5.1)
Sodium: 132 mmol/L — ABNORMAL LOW (ref 135–145)

## 2023-08-28 LAB — GLUCOSE, CAPILLARY
Glucose-Capillary: 183 mg/dL — ABNORMAL HIGH (ref 70–99)
Glucose-Capillary: 396 mg/dL — ABNORMAL HIGH (ref 70–99)

## 2023-08-28 LAB — BRAIN NATRIURETIC PEPTIDE: B Natriuretic Peptide: 733.6 pg/mL — ABNORMAL HIGH (ref 0.0–100.0)

## 2023-08-28 MED ORDER — CARVEDILOL 12.5 MG PO TABS
12.5000 mg | ORAL_TABLET | Freq: Two times a day (BID) | ORAL | Status: DC
  Administered 2023-08-28 – 2023-09-01 (×8): 12.5 mg via ORAL
  Filled 2023-08-28 (×8): qty 1

## 2023-08-28 MED ORDER — DIGOXIN 125 MCG PO TABS
0.0625 mg | ORAL_TABLET | Freq: Every day | ORAL | Status: DC
  Administered 2023-08-29 – 2023-09-01 (×4): 0.0625 mg via ORAL
  Filled 2023-08-28 (×4): qty 0.5

## 2023-08-28 MED ORDER — ENOXAPARIN SODIUM 40 MG/0.4ML IJ SOSY: 40.0000 mg | PREFILLED_SYRINGE | INTRAMUSCULAR | Status: DC

## 2023-08-28 MED ORDER — METHYLPREDNISOLONE SODIUM SUCC 125 MG IJ SOLR
125.0000 mg | Freq: Two times a day (BID) | INTRAMUSCULAR | Status: AC
  Administered 2023-08-28 – 2023-08-29 (×2): 125 mg via INTRAVENOUS
  Filled 2023-08-28 (×2): qty 2

## 2023-08-28 MED ORDER — POTASSIUM CHLORIDE CRYS ER 20 MEQ PO TBCR
40.0000 meq | EXTENDED_RELEASE_TABLET | Freq: Once | ORAL | Status: AC
  Administered 2023-08-28: 40 meq via ORAL
  Filled 2023-08-28: qty 2

## 2023-08-28 MED ORDER — TORSEMIDE 20 MG PO TABS
40.0000 mg | ORAL_TABLET | Freq: Every day | ORAL | Status: DC
  Administered 2023-08-28 – 2023-08-29 (×2): 40 mg via ORAL
  Filled 2023-08-28 (×2): qty 2

## 2023-08-28 MED ORDER — ALBUTEROL SULFATE (2.5 MG/3ML) 0.083% IN NEBU
2.5000 mg | INHALATION_SOLUTION | RESPIRATORY_TRACT | Status: DC | PRN
  Administered 2023-08-28: 2.5 mg via RESPIRATORY_TRACT
  Filled 2023-08-28: qty 3

## 2023-08-28 MED ORDER — ENOXAPARIN SODIUM 60 MG/0.6ML IJ SOSY
0.5000 mg/kg | PREFILLED_SYRINGE | INTRAMUSCULAR | Status: DC
  Filled 2023-08-28: qty 0.6

## 2023-08-28 MED ORDER — PREDNISONE 20 MG PO TABS
40.0000 mg | ORAL_TABLET | Freq: Every day | ORAL | Status: AC
  Administered 2023-08-29 – 2023-09-01 (×4): 40 mg via ORAL
  Filled 2023-08-28 (×4): qty 2

## 2023-08-28 MED ORDER — AZITHROMYCIN 250 MG PO TABS
500.0000 mg | ORAL_TABLET | Freq: Every day | ORAL | Status: AC
  Administered 2023-08-29 – 2023-08-30 (×2): 500 mg via ORAL
  Filled 2023-08-28 (×2): qty 2

## 2023-08-28 MED ORDER — SODIUM CHLORIDE 0.9 % IV SOLN
500.0000 mg | INTRAVENOUS | Status: AC
  Administered 2023-08-28: 500 mg via INTRAVENOUS
  Filled 2023-08-28: qty 5

## 2023-08-28 MED ORDER — GUAIFENESIN-DM 100-10 MG/5ML PO SYRP
5.0000 mL | ORAL_SOLUTION | ORAL | Status: DC | PRN
  Administered 2023-08-28 – 2023-08-30 (×6): 5 mL via ORAL
  Filled 2023-08-28 (×6): qty 10

## 2023-08-28 MED ORDER — FUROSEMIDE 10 MG/ML IJ SOLN
80.0000 mg | Freq: Once | INTRAMUSCULAR | Status: AC
  Administered 2023-08-28: 80 mg via INTRAVENOUS
  Filled 2023-08-28: qty 8

## 2023-08-28 MED ORDER — INSULIN ASPART 100 UNIT/ML IJ SOLN
0.0000 [IU] | Freq: Three times a day (TID) | INTRAMUSCULAR | Status: DC
  Administered 2023-08-29 – 2023-08-30 (×4): 5 [IU] via SUBCUTANEOUS
  Administered 2023-08-30: 7 [IU] via SUBCUTANEOUS
  Administered 2023-08-31: 2 [IU] via SUBCUTANEOUS
  Administered 2023-08-31: 5 [IU] via SUBCUTANEOUS
  Administered 2023-09-01: 2 [IU] via SUBCUTANEOUS
  Filled 2023-08-28 (×9): qty 1

## 2023-08-28 NOTE — Assessment & Plan Note (Signed)
-   Continue Coreg, Entresto, spironolactone

## 2023-08-28 NOTE — Assessment & Plan Note (Signed)
 Urine toxicology negative.

## 2023-08-28 NOTE — Progress Notes (Signed)
 PHARMACIST - PHYSICIAN COMMUNICATION  CONCERNING:  Enoxaparin  (Lovenox ) for DVT Prophylaxis    RECOMMENDATION: Patient was prescribed enoxaprin 40mg  q24 hours for VTE prophylaxis.   Filed Weights   08/28/23 0949  Weight: 101.2 kg (223 lb)    Body mass index is 34.93 kg/m.  Estimated Creatinine Clearance: 85.5 mL/min (A) (by C-G formula based on SCr of 1.08 mg/dL (H)).   Based on [facility] policy patient is candidate for enoxaparin  0.5mg /kg TBW SQ every 24 hours based on BMI being >30.  DESCRIPTION: Pharmacy has adjusted enoxaparin  dose per [facility] policy.  Patient is now receiving enoxaparin  50 mg every 24 hours   Tyrese Capriotti Rodriguez-Guzman PharmD, BCPS 08/28/2023 1:40 PM

## 2023-08-28 NOTE — ED Triage Notes (Signed)
 Pt [place] [facility] [place] park for SOB, pt reports she had fluid removed from lungs 4/25 but continues to have SOB, new onset non-productive after recently starting Lisinopril , hypertensive en route, EDP at bedside during triage

## 2023-08-28 NOTE — ED Notes (Signed)
 Pt given blue scrubs, mesh underwear, deodorant, toothbrush, toothpaste per request; pt states she urinated on herself PTA, pt obtaining urine specimen at this time

## 2023-08-28 NOTE — Assessment & Plan Note (Signed)
 With hyperglycemia Sugar is up secondary to high-dose steroids given.  Previous hemoglobin A1c 6.7.  Restarted Jardiance.

## 2023-08-28 NOTE — Plan of Care (Signed)
  Problem: Education: Goal: Knowledge of General Education information will improve Description: Including pain rating scale, medication(s)/side effects and non-pharmacologic comfort measures Outcome: Progressing   Problem: Health Behavior/Discharge Planning: Goal: Ability to manage health-related needs will improve Outcome: Progressing   Problem: Clinical Measurements: Goal: Ability to maintain clinical measurements within normal limits will improve Outcome: Progressing Goal: Will remain free from infection Outcome: Progressing Goal: Diagnostic test results will improve Outcome: Progressing Goal: Respiratory complications will improve Outcome: Progressing Goal: Cardiovascular complication will be avoided Outcome: Progressing   Problem: Activity: Goal: Risk for activity intolerance will decrease Outcome: Progressing   Problem: Nutrition: Goal: Adequate nutrition will be maintained Outcome: Progressing   Problem: Coping: Goal: Level of anxiety will decrease Outcome: Progressing   Problem: Elimination: Goal: Will not experience complications related to bowel motility Outcome: Progressing Goal: Will not experience complications related to urinary retention Outcome: Progressing   Problem: Pain Managment: Goal: General experience of comfort will improve and/or be controlled Outcome: Progressing   Problem: Safety: Goal: Ability to remain free from injury will improve Outcome: Progressing   Problem: Skin Integrity: Goal: Risk for impaired skin integrity will decrease Outcome: Progressing   Problem: Education: Goal: Ability to describe self-care measures that may prevent or decrease complications (Diabetes Survival Skills Education) will improve Outcome: Progressing Goal: Individualized Educational Video(s) Outcome: Progressing   Problem: Coping: Goal: Ability to adjust to condition or change in health will improve Outcome: Progressing   Problem: Fluid  Volume: Goal: Ability to maintain a balanced intake and output will improve Outcome: Progressing   Problem: Health Behavior/Discharge Planning: Goal: Ability to identify and utilize available resources and services will improve Outcome: Progressing Goal: Ability to manage health-related needs will improve Outcome: Progressing   Problem: Metabolic: Goal: Ability to maintain appropriate glucose levels will improve Outcome: Progressing   Problem: Nutritional: Goal: Maintenance of adequate nutrition will improve Outcome: Progressing Goal: Progress toward achieving an optimal weight will improve Outcome: Progressing   Problem: Skin Integrity: Goal: Risk for impaired skin integrity will decrease Outcome: Progressing   Problem: Tissue Perfusion: Goal: Adequacy of tissue perfusion will improve Outcome: Progressing   Problem: Education: Goal: Ability to demonstrate management of disease process will improve Outcome: Progressing Goal: Ability to verbalize understanding of medication therapies will improve Outcome: Progressing Goal: Individualized Educational Video(s) Outcome: Progressing   Problem: Activity: Goal: Capacity to carry out activities will improve Outcome: Progressing   Problem: Cardiac: Goal: Ability to achieve and maintain adequate cardiopulmonary perfusion will improve Outcome: Progressing   Problem: Education: Goal: Knowledge of disease or condition will improve Outcome: Progressing Goal: Knowledge of the prescribed therapeutic regimen will improve Outcome: Progressing Goal: Individualized Educational Video(s) Outcome: Progressing   Problem: Activity: Goal: Ability to tolerate increased activity will improve Outcome: Progressing Goal: Will verbalize the importance of balancing activity with adequate rest periods Outcome: Progressing   Problem: Respiratory: Goal: Ability to maintain a clear airway will improve Outcome: Progressing Goal: Levels of  oxygenation will improve Outcome: Progressing Goal: Ability to maintain adequate ventilation will improve Outcome: Progressing

## 2023-08-28 NOTE — Assessment & Plan Note (Signed)
 PPI ?

## 2023-08-28 NOTE — Assessment & Plan Note (Addendum)
 2D echo April 2025 with EF of 30 to 35% S/p AICD  Positive dyspnea on exertion, orthopnea PND Baseline homelessness with Lasix  compliance difficulty BNP 734 Chest x-ray with cardiomegaly interstitial edema IV Lasix  Strict Is and Os and daily weights Monitor volume status closely

## 2023-08-28 NOTE — ED Notes (Signed)
 Warm blanket and sandwich tray provided

## 2023-08-28 NOTE — H&P (Addendum)
 History and Physical    Patient: Molly Howard:[id number] DOB: [birth year] DOA: 08/28/2023 DOS: the patient was seen and examined on 08/28/2023 PCP: Rockney Cid, DO  Patient coming from: Home  Chief Complaint:  Chief Complaint  Patient presents with   Shortness of Breath   HPI: Molly Howard is a 38 y.o. female with medical history significant of homelessness, chronic HFrEF status post AICD, type 2 diabetes, polysubstance abuse, obesity presenting with acute on chronic HFrEF, COPD exacerbation.  Patient reports increased work of breathing over the past 3 to 4 days.  Noted to have been admitted April 14 through 15 for syncope secondary to dehydration.  Patient states that she has torsemide  dosing.  However because she does not live near a bathroom, she is hesitant take the medication because she does not have anywhere to fully urinate.  Has not taken medication in at least 1 to 2 weeks.  Positive worsening swelling.  Also with cough and wheezing.  Noted prior extensive prior smoking history.  Quit within the past 2 to 4 weeks.  No reported tobacco use.  Denies any recent polysubstance abuse though remote history of cocaine use.  Minimal chest pain.  No focal hemiparesis or confusion.  No abdominal pain or diarrhea. Presented to the ER afebrile, hemodynamically stable.  Satting well on room air.  Review of Systems: As mentioned in the history of present illness. All other systems reviewed and are negative. Past Medical History:  Diagnosis Date   Acid reflux    Chronic HFrEF (heart failure with reduced ejection fraction) (HCC)    a. 10/2019 Echo: EF 35-40%, GrII DD; b. 05/2020 Echo: EF 20-25%, glob HK; c. 10/2020 Echo: EF 30-35%, glob HK. GrII DD, Mildly red RV fxn. Mod TR; d.  05/2021 cMRI: EF 42%, no LGE. Nl RV size/fxn.   CKD (chronic kidney disease), stage II    Diabetes mellitus (HCC)    H/O medication noncompliance    Hypertension    Microcytic anemia    NICM (nonischemic  cardiomyopathy) (HCC)    a. 10/2019 Echo: EF 35-40%; b. 10/2019 MV: No ischemia. Small apical defect-->breast attenuation; c. 05/2020 Echo: EF 20-25%; d. 10/2020 Echo: EF 30-35%, glob HK. GrII DD, Mildly red RV fxn. Mod TR; e. 05/2021 cMRI: EF 42%, no LGE. Nl RV size/fxn.   Obesity    Polysubstance abuse [facility])    Past Surgical History:  Procedure Laterality Date   CHOLECYSTECTOMY     HERNIA REPAIR     ICD IMPLANT     RIGHT HEART CATH N/A 07/28/2022   Procedure: RIGHT HEART CATH;  Surgeon: Wenona Hamilton, MD;  Location: [facility] INVASIVE CV LAB;  Service: Cardiovascular;  Laterality: N/A;   SUBQ ICD IMPLANT N/A 06/27/2023   Procedure: SUBQ ICD IMPLANT;  Surgeon: Boyce Byes, MD;  Location: [facility] INVASIVE CV LAB;  Service: Cardiovascular;  Laterality: N/A;   Social History:  reports that she quit smoking about 3 years ago. Her smoking use included cigarettes. She has never used smokeless tobacco. She reports that she does not currently use alcohol. She reports that she does not currently use drugs after having used the following drugs: Cocaine.  Allergies  Allergen Reactions   Other Rash    Lemons     Family History  Problem Relation Age of Onset   Heart failure Mother        Onset of heart failure 40s.  Died in 10-Apr-2022.   Diabetes Mother  Hypertension Father    Diabetes Father     Prior to Admission medications   Medication Sig Start Date End Date Taking? Authorizing Provider  carvedilol  (COREG ) 12.5 MG tablet Take 1 tablet (12.5 mg total) by mouth 2 (two) times daily with a meal. 08/08/23  Yes Darus Engels A, DO  dapagliflozin  propanediol (FARXIGA ) 10 MG TABS tablet Take 1 tablet (10 mg total) by mouth once daily. 08/08/23  Yes Darus Engels A, DO  digoxin  (LANOXIN ) 0.125 MG tablet Take 0.5 tablets (0.0625 mg total) by mouth daily. 08/08/23  Yes Darus Engels A, DO  sacubitril -valsartan  (ENTRESTO ) 97-103 MG Take 2 tablets by mouth 2 (two) times daily.   Yes [provider]  Semaglutide ,0.25 or 0.5MG /DOS, (OZEMPIC , 0.25 OR 0.5 MG/DOSE,) 2 MG/3ML SOPN Inject 0.5 mg into the skin once a week. 08/09/23  Yes Hackney, Brian Campanile A, FNP  spironolactone  (ALDACTONE ) 25 MG tablet Take 1 tablet (25 mg total) by mouth daily. 08/08/23  Yes Darus Engels A, DO  torsemide  (DEMADEX ) 20 MG tablet Take 2 tablets (40 mg total) by mouth daily 08/08/23 09/07/23 Yes Darus Engels A, DO  Accu-Chek Softclix Lancets lancets Use as instructed 11/18/22   Rockney Cid, DO  Continuous Glucose Sensor (FREESTYLE LIBRE 3 SENSOR) MISC Place 1 sensor on the skin every 14 days. Use to check glucose continuously Patient not taking: Reported on 08/26/2023 01/14/23   Rockney Cid, DO  sacubitril -valsartan  (ENTRESTO ) 24-26 MG Take 1 tablet by mouth 2 (two) times daily. Patient not taking: Reported on 08/28/2023 08/16/23   Ezzard Holms, MD  Potassium Chloride  40 MEQ/15ML (20%) SOLN Take 40 mEq by mouth 2 (two) times daily. Patient not taking: Reported on 06/07/2023 07/04/20 10/18/20  Constancia Delton, MD    Physical Exam: Vitals:   08/28/23 1030 08/28/23 1145 08/28/23 1300 08/28/23 1318  BP: (!) 140/106 129/71 (!) 141/80   Pulse: 92 90 88 93  Resp: (!) 22 17 (!) 28 20  Temp:      TempSrc:      SpO2: 97% 95% (!) 86% 94%  Weight:      Height:       Physical Exam Constitutional:      Appearance: She is obese.  HENT:     Head: Normocephalic and atraumatic.     Nose: Nose normal.     Mouth/Throat:     Mouth: Mucous membranes are moist.  Eyes:     Pupils: Pupils are equal, round, and reactive to light.  Cardiovascular:     Rate and Rhythm: Normal rate and regular rhythm.  Pulmonary:     Effort: Pulmonary effort is normal.     Breath sounds: Wheezing present.  Abdominal:     General: Bowel sounds are normal.  Musculoskeletal:     Right lower leg: Edema present.     Left lower leg: Edema present.  Skin:    General: Skin is warm.  Neurological:     General: No focal deficit  present.  Psychiatric:        Mood and Affect: Mood normal.     Data Reviewed:  There are no new results to review at this time.  DG Chest 2 View CLINICAL DATA:  CHF, shortness of breath  EXAM: CHEST - 2 VIEW  COMPARISON:  08/15/2023  FINDINGS: Cardiomegaly. Direct epicardial defibrillator in stable position. Diffuse interstitial opacity with some Kerley lines and fissure thickening. No effusion or pneumothorax. No air bronchogram.  IMPRESSION: Cardiomegaly and mild interstitial edema.  Electronically  Signed   By: Ronnette Coke M.D.   On: 08/28/2023 10:30  Lab Results  Component Value Date   WBC 5.7 08/28/2023   HGB 10.4 (L) 08/28/2023   HCT 33.0 (L) 08/28/2023   MCV 97.3 08/28/2023   PLT 291 08/28/2023   Last metabolic panel Lab Results  Component Value Date   GLUCOSE 105 (H) 08/28/2023   NA 132 (L) 08/28/2023   K 4.4 08/28/2023   CL 105 08/28/2023   CO2 23 08/28/2023   BUN 46 (H) 08/28/2023   CREATININE 1.08 (H) 08/28/2023   GFRNONAA >60 08/28/2023   CALCIUM  8.2 (L) 08/28/2023   PHOS 7.3 (H) 08/04/2022   PROT 6.0 (L) 11/06/2022   ALBUMIN 1.8 (L) 11/06/2022   BILITOT 0.7 11/06/2022   ALKPHOS 167 (H) 11/06/2022   AST 18 11/06/2022   ALT 11 11/06/2022   ANIONGAP 4 (L) 08/28/2023    Assessment and Plan: * Acute on chronic HFrEF (heart failure with reduced ejection fraction) (HCC) 2D echo April 2025 with EF of 30 to 35% S/p AICD  Positive dyspnea on exertion, orthopnea PND Baseline homelessness with Lasix  compliance difficulty BNP 734 Chest x-ray with cardiomegaly interstitial edema IV Lasix  Strict Is and Os and daily weights Monitor volume status closely    COPD exacerbation (HCC) Positive cough wheezing and increased sputum production in the setting of overlapping acute on chronic HFpEF Noted longstanding smoking history though recently quit Positive diffuse wheezing on exam Suspect component of obstructive lung disease Heart rate  Solu-Medrol  DuoNebs IV azithromycin Continue to monitor respiratory status closely  Diabetes mellitus, type II (HCC) Creatinine 100s SSI Monitor with steroid use  Essential hypertension BP stable Titrate home regimen  GERD without esophagitis PPI    Polysubstance abuse (HCC) UDS pending      Advance Care Planning:   Code Status: Full Code   Consults: None   Family Communication: No family at the bedside  Severity of Illness: The appropriate patient status for this patient is OBSERVATION. Observation status is judged to be reasonable and necessary in order to provide the required intensity of service to ensure the patient's safety. The patient's presenting symptoms, physical exam findings, and initial radiographic and laboratory data in the context of their medical condition is felt to place them at decreased risk for further clinical deterioration. Furthermore, it is anticipated that the patient will be medically stable for discharge from the hospital within 2 midnights of admission.   Author: Corrinne Din, MD 08/28/2023 1:34 PM  For on call review [email or web address].

## 2023-08-28 NOTE — ED Provider Notes (Signed)
 [facility] Provider Note    Event Date/Time   First MD Initiated Contact with Patient 08/28/23 [phone]     (approximate)   History   No chief complaint on file.   HPI  Molly Howard is a 38 y.o. female who presents to the ED for evaluation of No chief complaint on file.   I review CHF clinic visit from 2 days ago.  History of CHF and polysubstance abuse including cocaine abuse.  ICD in place.  EF 25%.  Signs that she is undomiciled and not taking her medications.  Received 80 mg IV Lasix  in the clinic  Patient presents to the ED due to continued shortness of breath, orthopnea and fluid retention.  Reports that she is currently undomiciled and does not have her medications, [place]  Some left-sided chest discomfort without ICD shocks, syncope, fever.  Cough productive of small-volume thin frothy sputum.   Physical Exam   Triage Vital Signs: ED Triage Vitals  Encounter Vitals Group     BP      Systolic BP Percentile      Diastolic BP Percentile      Pulse      Resp      Temp      Temp src      SpO2      Weight      Height      Head Circumference      Peak Flow      Pain Score      Pain Loc      Pain Education      Exclude from Growth Chart     Most recent vital signs: Vitals:   08/28/23 1000 08/28/23 1030  BP: (!) 147/90 (!) 140/106  Pulse: 87 92  Resp: 20 (!) 22  Temp:    SpO2: 96% 97%    General: Awake, no distress.  CV:  Good peripheral perfusion.  Resp:  Mild tachypnea to the low 20s without distress Abd:  No distention.  MSK:  No deformity noted.  Trace bilateral pitting edema Neuro:  No focal deficits appreciated. Other:     ED Results / Procedures / Treatments   Labs (all labs ordered are listed, but only abnormal results are displayed) Labs Reviewed  CBC WITH DIFFERENTIAL/PLATELET - Abnormal; Notable for the following components:      Result Value   RBC 3.39 (*)    Hemoglobin 10.4 (*)     HCT 33.0 (*)    All other components within normal limits  BRAIN NATRIURETIC PEPTIDE - Abnormal; Notable for the following components:   B Natriuretic Peptide 733.6 (*)    All other components within normal limits  BASIC METABOLIC PANEL WITH GFR - Abnormal; Notable for the following components:   Sodium 132 (*)    Glucose, Bld 105 (*)    BUN 46 (*)    Creatinine, Ser 1.08 (*)    Calcium  8.2 (*)    Anion gap 4 (*)    All other components within normal limits  URINALYSIS, ROUTINE W REFLEX MICROSCOPIC - Abnormal; Notable for the following components:   Color, Urine YELLOW (*)    APPearance HAZY (*)    Hgb urine dipstick MODERATE (*)    Protein, ur >=300 (*)    Leukocytes,Ua TRACE (*)    Bacteria, UA RARE (*)    All other components within normal limits  TROPONIN I (HIGH SENSITIVITY) - Abnormal;  Notable for the following components:   Troponin I (High Sensitivity) 23 (*)    All other components within normal limits  MAGNESIUM   URINE DRUG SCREEN, QUALITATIVE ([facility] ONLY)    EKG Sinus rhythm with a rate of 93 bpm.  Normal axis and intervals.  No for signs of acute ischemia  RADIOLOGY 2 view CXR interpreted by me with pulmonary vascular congestion without pleural effusion  Official radiology report(s): DG Chest 2 View Result Date: 08/28/2023 CLINICAL DATA:  CHF, shortness of breath EXAM: CHEST - 2 VIEW COMPARISON:  08/15/2023 FINDINGS: Cardiomegaly. Direct epicardial defibrillator in stable position. Diffuse interstitial opacity with some Kerley lines and fissure thickening. No effusion or pneumothorax. No air bronchogram. IMPRESSION: Cardiomegaly and mild interstitial edema. Electronically Signed   By: Ronnette Coke M.D.   On: 08/28/2023 10:30    PROCEDURES and INTERVENTIONS:  .1-3 Lead EKG Interpretation  Performed by: Arline Bennett, MD Authorized by: Arline Bennett, MD     Interpretation: normal     ECG rate:  90   ECG rate assessment: normal     Rhythm: sinus rhythm      Ectopy: none     Conduction: normal     Medications  furosemide  (LASIX ) injection 80 mg (80 mg Intravenous Given 08/28/23 1112)  potassium chloride  SA (KLOR-CON  M) CR tablet 40 mEq (40 mEq Oral Given 08/28/23 1112)     IMPRESSION / MDM / ASSESSMENT AND PLAN / ED COURSE  I reviewed the triage vital signs and the nursing notes.  Differential diagnosis includes, but is not limited to, ACS, PTX, PNA, muscle strain/spasm, PE, dissection, anxiety, pleural effusion, symptomatic CHF exacerbation  {Patient presents with symptoms of an acute illness or injury that is potentially life-threatening.  Patient presents to the ED with signs of CHF exacerbation requiring medical admission.  She is symptomatic and tachypneic but not hypoxic.  No distress or indications for BiPAP at this point.  Generally reassuring blood work with renal dysfunction around baseline, no leukocytosis.  BNP is elevated.  Troponin is mildly elevated and we will trend this.  CXR is congested without localized infiltrate.  Initiate diuresis, potassium supplementation and consult medicine for admission.  Clinical Course as of 08/28/23 1115  Sun Aug 28, 2023  1111 I consult with medicine who agrees to admit [DS]    Clinical Course User Index [DS] Arline Bennett, MD     FINAL CLINICAL IMPRESSION(S) / ED DIAGNOSES   Final diagnoses:  Acute on chronic systolic congestive heart failure (HCC)     Rx / DC Orders   ED Discharge Orders     None        Note:  This document was prepared using Dragon voice recognition software and may include unintentional dictation errors.   Arline Bennett, MD 08/28/23 1116

## 2023-08-28 NOTE — ED Notes (Signed)
 Pt O2 saturations while sleeping 87%. Pt placed on [place] 2 LPM. O2 saturations 93%.

## 2023-08-28 NOTE — Assessment & Plan Note (Signed)
 Positive cough wheezing and increased sputum production in the setting of overlapping acute on chronic HFpEF Noted longstanding smoking history though recently quit Positive diffuse wheezing on exam Suspect component of obstructive lung disease Heart rate Solu-Medrol  DuoNebs IV azithromycin Continue to monitor respiratory status closely

## 2023-08-29 ENCOUNTER — Other Ambulatory Visit: Payer: Self-pay

## 2023-08-29 ENCOUNTER — Telehealth (HOSPITAL_COMMUNITY): Payer: Self-pay | Admitting: Licensed Clinical Social Worker

## 2023-08-29 DIAGNOSIS — K219 Gastro-esophageal reflux disease without esophagitis: Secondary | ICD-10-CM

## 2023-08-29 DIAGNOSIS — E871 Hypo-osmolality and hyponatremia: Secondary | ICD-10-CM | POA: Diagnosis present

## 2023-08-29 DIAGNOSIS — Z833 Family history of diabetes mellitus: Secondary | ICD-10-CM | POA: Diagnosis not present

## 2023-08-29 DIAGNOSIS — N179 Acute kidney failure, unspecified: Secondary | ICD-10-CM | POA: Diagnosis present

## 2023-08-29 DIAGNOSIS — Z91018 Allergy to other foods: Secondary | ICD-10-CM | POA: Diagnosis not present

## 2023-08-29 DIAGNOSIS — T380X5A Adverse effect of glucocorticoids and synthetic analogues, initial encounter: Secondary | ICD-10-CM | POA: Diagnosis present

## 2023-08-29 DIAGNOSIS — I10 Essential (primary) hypertension: Secondary | ICD-10-CM | POA: Diagnosis not present

## 2023-08-29 DIAGNOSIS — E66811 Obesity, class 1: Secondary | ICD-10-CM | POA: Diagnosis present

## 2023-08-29 DIAGNOSIS — Z7984 Long term (current) use of oral hypoglycemic drugs: Secondary | ICD-10-CM | POA: Diagnosis not present

## 2023-08-29 DIAGNOSIS — Z95810 Presence of automatic (implantable) cardiac defibrillator: Secondary | ICD-10-CM | POA: Diagnosis not present

## 2023-08-29 DIAGNOSIS — Z79899 Other long term (current) drug therapy: Secondary | ICD-10-CM | POA: Diagnosis not present

## 2023-08-29 DIAGNOSIS — Z6833 Body mass index (BMI) 33.0-33.9, adult: Secondary | ICD-10-CM | POA: Diagnosis not present

## 2023-08-29 DIAGNOSIS — E1159 Type 2 diabetes mellitus with other circulatory complications: Secondary | ICD-10-CM | POA: Diagnosis not present

## 2023-08-29 DIAGNOSIS — Z8249 Family history of ischemic heart disease and other diseases of the circulatory system: Secondary | ICD-10-CM | POA: Diagnosis not present

## 2023-08-29 DIAGNOSIS — I5021 Acute systolic (congestive) heart failure: Secondary | ICD-10-CM | POA: Diagnosis present

## 2023-08-29 DIAGNOSIS — I428 Other cardiomyopathies: Secondary | ICD-10-CM | POA: Diagnosis present

## 2023-08-29 DIAGNOSIS — E1165 Type 2 diabetes mellitus with hyperglycemia: Secondary | ICD-10-CM | POA: Diagnosis present

## 2023-08-29 DIAGNOSIS — J441 Chronic obstructive pulmonary disease with (acute) exacerbation: Secondary | ICD-10-CM | POA: Diagnosis present

## 2023-08-29 DIAGNOSIS — I130 Hypertensive heart and chronic kidney disease with heart failure and stage 1 through stage 4 chronic kidney disease, or unspecified chronic kidney disease: Secondary | ICD-10-CM | POA: Diagnosis present

## 2023-08-29 DIAGNOSIS — I5023 Acute on chronic systolic (congestive) heart failure: Secondary | ICD-10-CM | POA: Diagnosis present

## 2023-08-29 DIAGNOSIS — E1122 Type 2 diabetes mellitus with diabetic chronic kidney disease: Secondary | ICD-10-CM | POA: Diagnosis present

## 2023-08-29 DIAGNOSIS — N182 Chronic kidney disease, stage 2 (mild): Secondary | ICD-10-CM | POA: Diagnosis present

## 2023-08-29 DIAGNOSIS — Z7985 Long-term (current) use of injectable non-insulin antidiabetic drugs: Secondary | ICD-10-CM | POA: Diagnosis not present

## 2023-08-29 DIAGNOSIS — Z87891 Personal history of nicotine dependence: Secondary | ICD-10-CM | POA: Diagnosis not present

## 2023-08-29 DIAGNOSIS — Z5900 Homelessness unspecified: Secondary | ICD-10-CM | POA: Diagnosis not present

## 2023-08-29 DIAGNOSIS — N189 Chronic kidney disease, unspecified: Secondary | ICD-10-CM

## 2023-08-29 DIAGNOSIS — F1410 Cocaine abuse, uncomplicated: Secondary | ICD-10-CM | POA: Diagnosis present

## 2023-08-29 LAB — BASIC METABOLIC PANEL WITH GFR
Anion gap: 10 (ref 5–15)
BUN: 48 mg/dL — ABNORMAL HIGH (ref 6–20)
CO2: 24 mmol/L (ref 22–32)
Calcium: 8.3 mg/dL — ABNORMAL LOW (ref 8.9–10.3)
Chloride: 102 mmol/L (ref 98–111)
Creatinine, Ser: 1.41 mg/dL — ABNORMAL HIGH (ref 0.44–1.00)
GFR, Estimated: 49 mL/min — ABNORMAL LOW (ref >60)
Glucose, Bld: 356 mg/dL — ABNORMAL HIGH (ref 70–99)
Potassium: 4.7 mmol/L (ref 3.5–5.1)
Sodium: 136 mmol/L (ref 135–145)

## 2023-08-29 LAB — GLUCOSE, CAPILLARY
Glucose-Capillary: 279 mg/dL — ABNORMAL HIGH (ref 70–99)
Glucose-Capillary: 273 mg/dL — ABNORMAL HIGH (ref 70–99)
Glucose-Capillary: 218 mg/dL — ABNORMAL HIGH (ref 70–99)
Glucose-Capillary: 347 mg/dL — ABNORMAL HIGH (ref 70–99)

## 2023-08-29 MED ORDER — SACUBITRIL-VALSARTAN 24-26 MG PO TABS
1.0000 | ORAL_TABLET | Freq: Two times a day (BID) | ORAL | Status: DC
  Administered 2023-08-29 – 2023-09-01 (×6): 1 via ORAL
  Filled 2023-08-29 (×6): qty 1

## 2023-08-29 MED ORDER — DAPAGLIFLOZIN PROPANEDIOL 10 MG PO TABS
10.0000 mg | ORAL_TABLET | Freq: Every day | ORAL | Status: DC
  Administered 2023-08-29 – 2023-09-01 (×4): 10 mg via ORAL
  Filled 2023-08-29 (×4): qty 1

## 2023-08-29 MED ORDER — FUROSEMIDE 10 MG/ML IJ SOLN
40.0000 mg | Freq: Two times a day (BID) | INTRAMUSCULAR | Status: DC
  Administered 2023-08-29: 40 mg via INTRAVENOUS
  Filled 2023-08-29: qty 4

## 2023-08-29 MED ORDER — FUROSEMIDE 10 MG/ML IJ SOLN: 40.0000 mg | Freq: Two times a day (BID) | INTRAMUSCULAR | Status: DC

## 2023-08-29 MED ORDER — ENOXAPARIN SODIUM 60 MG/0.6ML IJ SOSY
0.5000 mg/kg | PREFILLED_SYRINGE | INTRAMUSCULAR | Status: DC
  Filled 2023-08-29 (×2): qty 0.6

## 2023-08-29 MED ORDER — FUROSEMIDE 10 MG/ML IJ SOLN
60.0000 mg | Freq: Two times a day (BID) | INTRAMUSCULAR | Status: DC
  Administered 2023-08-29: 60 mg via INTRAVENOUS

## 2023-08-29 NOTE — Plan of Care (Signed)
 Problem: Education: Goal: Knowledge of General Education information will improve Description: Including pain rating scale, medication(s)/side effects and non-pharmacologic comfort measures 08/29/2023 1925 by Waneta Gut, RN Outcome: Progressing 08/29/2023 1924 by Waneta Gut, RN Outcome: Progressing   Problem: Health Behavior/Discharge Planning: Goal: Ability to manage health-related needs will improve 08/29/2023 1925 by Waneta Gut, RN Outcome: Progressing 08/29/2023 1924 by Waneta Gut, RN Outcome: Progressing   Problem: Clinical Measurements: Goal: Ability to maintain clinical measurements within normal limits will improve 08/29/2023 1925 by Waneta Gut, RN Outcome: Progressing 08/29/2023 1924 by Waneta Gut, RN Outcome: Progressing Goal: Will remain free from infection 08/29/2023 1925 by Waneta Gut, RN Outcome: Progressing 08/29/2023 1924 by Waneta Gut, RN Outcome: Progressing Goal: Diagnostic test results will improve 08/29/2023 1925 by Waneta Gut, RN Outcome: Progressing 08/29/2023 1924 by Waneta Gut, RN Outcome: Progressing Goal: Respiratory complications will improve 08/29/2023 1925 by Waneta Gut, RN Outcome: Progressing 08/29/2023 1924 by Waneta Gut, RN Outcome: Progressing Goal: Cardiovascular complication will be avoided 08/29/2023 1925 by Waneta Gut, RN Outcome: Progressing 08/29/2023 1924 by Waneta Gut, RN Outcome: Progressing   Problem: Activity: Goal: Risk for activity intolerance will decrease 08/29/2023 1925 by Waneta Gut, RN Outcome: Progressing 08/29/2023 1924 by Waneta Gut, RN Outcome: Progressing   Problem: Nutrition: Goal: Adequate nutrition will be maintained 08/29/2023 1925 by Waneta Gut, RN Outcome: Progressing 08/29/2023 1924 by Waneta Gut, RN Outcome: Progressing   Problem:  Coping: Goal: Level of anxiety will decrease 08/29/2023 1925 by Waneta Gut, RN Outcome: Progressing 08/29/2023 1924 by Waneta Gut, RN Outcome: Progressing   Problem: Elimination: Goal: Will not experience complications related to bowel motility 08/29/2023 1925 by Waneta Gut, RN Outcome: Progressing 08/29/2023 1924 by Waneta Gut, RN Outcome: Progressing Goal: Will not experience complications related to urinary retention 08/29/2023 1925 by Waneta Gut, RN Outcome: Progressing 08/29/2023 1924 by Waneta Gut, RN Outcome: Progressing   Problem: Pain Managment: Goal: General experience of comfort will improve and/or be controlled 08/29/2023 1925 by Waneta Gut, RN Outcome: Progressing 08/29/2023 1924 by Waneta Gut, RN Outcome: Progressing   Problem: Safety: Goal: Ability to remain free from injury will improve 08/29/2023 1925 by Waneta Gut, RN Outcome: Progressing 08/29/2023 1924 by Waneta Gut, RN Outcome: Progressing   Problem: Skin Integrity: Goal: Risk for impaired skin integrity will decrease 08/29/2023 1925 by Waneta Gut, RN Outcome: Progressing 08/29/2023 1924 by Waneta Gut, RN Outcome: Progressing   Problem: Education: Goal: Ability to describe self-care measures that may prevent or decrease complications (Diabetes Survival Skills Education) will improve 08/29/2023 1925 by Waneta Gut, RN Outcome: Progressing 08/29/2023 1924 by Waneta Gut, RN Outcome: Progressing Goal: Individualized Educational Video(s) 08/29/2023 1925 by Waneta Gut, RN Outcome: Progressing 08/29/2023 1924 by Waneta Gut, RN Outcome: Progressing   Problem: Coping: Goal: Ability to adjust to condition or change in health will improve 08/29/2023 1925 by Waneta Gut, RN Outcome: Progressing 08/29/2023 1924 by Waneta Gut, RN Outcome:  Progressing   Problem: Fluid Volume: Goal: Ability to maintain a balanced intake and output will improve 08/29/2023 1925 by Waneta Gut, RN Outcome: Progressing 08/29/2023 1924 by Waneta Gut, RN Outcome: Progressing   Problem: Health Behavior/Discharge Planning: Goal: Ability to identify and utilize available resources and services will improve 08/29/2023 1925 by Waneta Gut, RN Outcome: Progressing 08/29/2023 1924 by  Waneta Gut, RN Outcome: Progressing Goal: Ability to manage health-related needs will improve 08/29/2023 1925 by Waneta Gut, RN Outcome: Progressing 08/29/2023 1924 by Waneta Gut, RN Outcome: Progressing   Problem: Metabolic: Goal: Ability to maintain appropriate glucose levels will improve 08/29/2023 1925 by Waneta Gut, RN Outcome: Progressing 08/29/2023 1924 by Waneta Gut, RN Outcome: Progressing   Problem: Nutritional: Goal: Maintenance of adequate nutrition will improve 08/29/2023 1925 by Waneta Gut, RN Outcome: Progressing 08/29/2023 1924 by Waneta Gut, RN Outcome: Progressing Goal: Progress toward achieving an optimal weight will improve 08/29/2023 1925 by Waneta Gut, RN Outcome: Progressing 08/29/2023 1924 by Waneta Gut, RN Outcome: Progressing   Problem: Skin Integrity: Goal: Risk for impaired skin integrity will decrease 08/29/2023 1925 by Waneta Gut, RN Outcome: Progressing 08/29/2023 1924 by Waneta Gut, RN Outcome: Progressing   Problem: Tissue Perfusion: Goal: Adequacy of tissue perfusion will improve 08/29/2023 1925 by Waneta Gut, RN Outcome: Progressing 08/29/2023 1924 by Waneta Gut, RN Outcome: Progressing   Problem: Education: Goal: Ability to demonstrate management of disease process will improve 08/29/2023 1925 by Waneta Gut, RN Outcome: Progressing 08/29/2023 1924 by Waneta Gut, RN Outcome: Progressing Goal: Ability to verbalize understanding of medication therapies will improve 08/29/2023 1925 by Waneta Gut, RN Outcome: Progressing 08/29/2023 1924 by Waneta Gut, RN Outcome: Progressing Goal: Individualized Educational Video(s) 08/29/2023 1925 by Waneta Gut, RN Outcome: Progressing 08/29/2023 1924 by Waneta Gut, RN Outcome: Progressing   Problem: Activity: Goal: Capacity to carry out activities will improve 08/29/2023 1925 by Waneta Gut, RN Outcome: Progressing 08/29/2023 1924 by Waneta Gut, RN Outcome: Progressing   Problem: Cardiac: Goal: Ability to achieve and maintain adequate cardiopulmonary perfusion will improve 08/29/2023 1925 by Waneta Gut, RN Outcome: Progressing 08/29/2023 1924 by Waneta Gut, RN Outcome: Progressing   Problem: Education: Goal: Knowledge of disease or condition will improve 08/29/2023 1925 by Waneta Gut, RN Outcome: Progressing 08/29/2023 1924 by Waneta Gut, RN Outcome: Progressing Goal: Knowledge of the prescribed therapeutic regimen will improve 08/29/2023 1925 by Waneta Gut, RN Outcome: Progressing 08/29/2023 1924 by Waneta Gut, RN Outcome: Progressing Goal: Individualized Educational Video(s) 08/29/2023 1925 by Waneta Gut, RN Outcome: Progressing 08/29/2023 1924 by Waneta Gut, RN Outcome: Progressing   Problem: Activity: Goal: Ability to tolerate increased activity will improve 08/29/2023 1925 by Waneta Gut, RN Outcome: Progressing 08/29/2023 1924 by Waneta Gut, RN Outcome: Progressing Goal: Will verbalize the importance of balancing activity with adequate rest periods 08/29/2023 1925 by Waneta Gut, RN Outcome: Progressing 08/29/2023 1924 by Waneta Gut, RN Outcome: Progressing   Problem: Respiratory: Goal: Ability to maintain a clear  airway will improve 08/29/2023 1925 by Waneta Gut, RN Outcome: Progressing 08/29/2023 1924 by Waneta Gut, RN Outcome: Progressing Goal: Levels of oxygenation will improve 08/29/2023 1925 by Waneta Gut, RN Outcome: Progressing 08/29/2023 1924 by Waneta Gut, RN Outcome: Progressing Goal: Ability to maintain adequate ventilation will improve 08/29/2023 1925 by Waneta Gut, RN Outcome: Progressing 08/29/2023 1924 by Waneta Gut, RN Outcome: Progressing

## 2023-08-29 NOTE — Hospital Course (Addendum)
 38 y.o. female with medical history significant of homelessness, chronic HFrEF status post AICD, type 2 diabetes, polysubstance abuse, obesity presenting with acute on chronic HFrEF, COPD exacerbation.  Patient reports increased work of breathing over the past 3 to 4 days.  Noted to have been admitted April 14 through 15 for syncope secondary to dehydration.  Patient states that she has torsemide  dosing.  However because she does not live near a bathroom, she is hesitant take the medication because she does not have anywhere to fully urinate.  Has not taken medication in at least 1 to 2 weeks.  Positive worsening swelling.  Also with cough and wheezing.   Clinically, patient has volume overload.  Chest x-ray showed cardiomegaly with interstitial edema.  Elevated BNP.  Patient was treated with IV Lasix . Condition much improved, renal function slightly worsened.  Medically stable for discharge. Currently homeless, discussed with TOC to see if patient can find shelter.

## 2023-08-29 NOTE — Plan of Care (Signed)
 Problem: Education: Goal: Knowledge of General Education information will improve Description: Including pain rating scale, medication(s)/side effects and non-pharmacologic comfort measures 08/29/2023 1925 by Waneta Gut, RN Outcome: Progressing 08/29/2023 1925 by Waneta Gut, RN Outcome: Progressing 08/29/2023 1924 by Waneta Gut, RN Outcome: Progressing   Problem: Health Behavior/Discharge Planning: Goal: Ability to manage health-related needs will improve 08/29/2023 1925 by Waneta Gut, RN Outcome: Progressing 08/29/2023 1925 by Waneta Gut, RN Outcome: Progressing 08/29/2023 1924 by Waneta Gut, RN Outcome: Progressing   Problem: Clinical Measurements: Goal: Ability to maintain clinical measurements within normal limits will improve 08/29/2023 1925 by Waneta Gut, RN Outcome: Progressing 08/29/2023 1925 by Waneta Gut, RN Outcome: Progressing 08/29/2023 1924 by Waneta Gut, RN Outcome: Progressing Goal: Will remain free from infection 08/29/2023 1925 by Waneta Gut, RN Outcome: Progressing 08/29/2023 1925 by Waneta Gut, RN Outcome: Progressing 08/29/2023 1924 by Waneta Gut, RN Outcome: Progressing Goal: Diagnostic test results will improve 08/29/2023 1925 by Waneta Gut, RN Outcome: Progressing 08/29/2023 1925 by Waneta Gut, RN Outcome: Progressing 08/29/2023 1924 by Waneta Gut, RN Outcome: Progressing Goal: Respiratory complications will improve 08/29/2023 1925 by Waneta Gut, RN Outcome: Progressing 08/29/2023 1925 by Waneta Gut, RN Outcome: Progressing 08/29/2023 1924 by Waneta Gut, RN Outcome: Progressing Goal: Cardiovascular complication will be avoided 08/29/2023 1925 by Waneta Gut, RN Outcome: Progressing 08/29/2023 1925 by Waneta Gut, RN Outcome: Progressing 08/29/2023 1924 by  Waneta Gut, RN Outcome: Progressing   Problem: Activity: Goal: Risk for activity intolerance will decrease 08/29/2023 1925 by Waneta Gut, RN Outcome: Progressing 08/29/2023 1925 by Waneta Gut, RN Outcome: Progressing 08/29/2023 1924 by Waneta Gut, RN Outcome: Progressing   Problem: Nutrition: Goal: Adequate nutrition will be maintained 08/29/2023 1925 by Waneta Gut, RN Outcome: Progressing 08/29/2023 1925 by Waneta Gut, RN Outcome: Progressing 08/29/2023 1924 by Waneta Gut, RN Outcome: Progressing   Problem: Coping: Goal: Level of anxiety will decrease 08/29/2023 1925 by Waneta Gut, RN Outcome: Progressing 08/29/2023 1925 by Waneta Gut, RN Outcome: Progressing 08/29/2023 1924 by Waneta Gut, RN Outcome: Progressing   Problem: Elimination: Goal: Will not experience complications related to bowel motility 08/29/2023 1925 by Waneta Gut, RN Outcome: Progressing 08/29/2023 1925 by Waneta Gut, RN Outcome: Progressing 08/29/2023 1924 by Waneta Gut, RN Outcome: Progressing Goal: Will not experience complications related to urinary retention 08/29/2023 1925 by Waneta Gut, RN Outcome: Progressing 08/29/2023 1925 by Waneta Gut, RN Outcome: Progressing 08/29/2023 1924 by Waneta Gut, RN Outcome: Progressing   Problem: Pain Managment: Goal: General experience of comfort will improve and/or be controlled 08/29/2023 1925 by Waneta Gut, RN Outcome: Progressing 08/29/2023 1925 by Waneta Gut, RN Outcome: Progressing 08/29/2023 1924 by Waneta Gut, RN Outcome: Progressing   Problem: Safety: Goal: Ability to remain free from injury will improve 08/29/2023 1925 by Waneta Gut, RN Outcome: Progressing 08/29/2023 1925 by Waneta Gut, RN Outcome: Progressing 08/29/2023 1924 by Waneta Gut,  RN Outcome: Progressing   Problem: Skin Integrity: Goal: Risk for impaired skin integrity will decrease 08/29/2023 1925 by Waneta Gut, RN Outcome: Progressing 08/29/2023 1925 by Waneta Gut, RN Outcome: Progressing 08/29/2023 1924 by Waneta Gut, RN Outcome: Progressing   Problem: Education: Goal: Ability to describe self-care measures that may prevent or decrease complications (Diabetes Survival Skills Education) will improve 08/29/2023 1925 by  Waneta Gut, RN Outcome: Progressing 08/29/2023 1925 by Waneta Gut, RN Outcome: Progressing 08/29/2023 1924 by Waneta Gut, RN Outcome: Progressing Goal: Individualized Educational Video(s) 08/29/2023 1925 by Waneta Gut, RN Outcome: Progressing 08/29/2023 1925 by Waneta Gut, RN Outcome: Progressing 08/29/2023 1924 by Waneta Gut, RN Outcome: Progressing   Problem: Coping: Goal: Ability to adjust to condition or change in health will improve 08/29/2023 1925 by Waneta Gut, RN Outcome: Progressing 08/29/2023 1925 by Waneta Gut, RN Outcome: Progressing 08/29/2023 1924 by Waneta Gut, RN Outcome: Progressing   Problem: Fluid Volume: Goal: Ability to maintain a balanced intake and output will improve 08/29/2023 1925 by Waneta Gut, RN Outcome: Progressing 08/29/2023 1925 by Waneta Gut, RN Outcome: Progressing 08/29/2023 1924 by Waneta Gut, RN Outcome: Progressing   Problem: Health Behavior/Discharge Planning: Goal: Ability to identify and utilize available resources and services will improve 08/29/2023 1925 by Waneta Gut, RN Outcome: Progressing 08/29/2023 1925 by Waneta Gut, RN Outcome: Progressing 08/29/2023 1924 by Waneta Gut, RN Outcome: Progressing Goal: Ability to manage health-related needs will improve 08/29/2023 1925 by Waneta Gut, RN Outcome:  Progressing 08/29/2023 1925 by Waneta Gut, RN Outcome: Progressing 08/29/2023 1924 by Waneta Gut, RN Outcome: Progressing   Problem: Metabolic: Goal: Ability to maintain appropriate glucose levels will improve 08/29/2023 1925 by Waneta Gut, RN Outcome: Progressing 08/29/2023 1925 by Waneta Gut, RN Outcome: Progressing 08/29/2023 1924 by Waneta Gut, RN Outcome: Progressing   Problem: Nutritional: Goal: Maintenance of adequate nutrition will improve 08/29/2023 1925 by Waneta Gut, RN Outcome: Progressing 08/29/2023 1925 by Waneta Gut, RN Outcome: Progressing 08/29/2023 1924 by Waneta Gut, RN Outcome: Progressing Goal: Progress toward achieving an optimal weight will improve 08/29/2023 1925 by Waneta Gut, RN Outcome: Progressing 08/29/2023 1925 by Waneta Gut, RN Outcome: Progressing 08/29/2023 1924 by Waneta Gut, RN Outcome: Progressing   Problem: Skin Integrity: Goal: Risk for impaired skin integrity will decrease 08/29/2023 1925 by Waneta Gut, RN Outcome: Progressing 08/29/2023 1925 by Waneta Gut, RN Outcome: Progressing 08/29/2023 1924 by Waneta Gut, RN Outcome: Progressing   Problem: Tissue Perfusion: Goal: Adequacy of tissue perfusion will improve 08/29/2023 1925 by Waneta Gut, RN Outcome: Progressing 08/29/2023 1925 by Waneta Gut, RN Outcome: Progressing 08/29/2023 1924 by Waneta Gut, RN Outcome: Progressing   Problem: Education: Goal: Ability to demonstrate management of disease process will improve 08/29/2023 1925 by Waneta Gut, RN Outcome: Progressing 08/29/2023 1925 by Waneta Gut, RN Outcome: Progressing 08/29/2023 1924 by Waneta Gut, RN Outcome: Progressing Goal: Ability to verbalize understanding of medication therapies will improve 08/29/2023 1925 by Waneta Gut,  RN Outcome: Progressing 08/29/2023 1925 by Waneta Gut, RN Outcome: Progressing 08/29/2023 1924 by Waneta Gut, RN Outcome: Progressing Goal: Individualized Educational Video(s) 08/29/2023 1925 by Waneta Gut, RN Outcome: Progressing 08/29/2023 1925 by Waneta Gut, RN Outcome: Progressing 08/29/2023 1924 by Waneta Gut, RN Outcome: Progressing   Problem: Activity: Goal: Capacity to carry out activities will improve 08/29/2023 1925 by Waneta Gut, RN Outcome: Progressing 08/29/2023 1925 by Waneta Gut, RN Outcome: Progressing 08/29/2023 1924 by Waneta Gut, RN Outcome: Progressing   Problem: Cardiac: Goal: Ability to achieve and maintain adequate cardiopulmonary perfusion will improve 08/29/2023 1925 by Waneta Gut, RN Outcome: Progressing 08/29/2023 1925 by Waneta Gut, RN Outcome: Progressing 08/29/2023 1924 by  Waneta Gut, RN Outcome: Progressing   Problem: Education: Goal: Knowledge of disease or condition will improve 08/29/2023 1925 by Waneta Gut, RN Outcome: Progressing 08/29/2023 1925 by Waneta Gut, RN Outcome: Progressing 08/29/2023 1924 by Waneta Gut, RN Outcome: Progressing Goal: Knowledge of the prescribed therapeutic regimen will improve 08/29/2023 1925 by Waneta Gut, RN Outcome: Progressing 08/29/2023 1925 by Waneta Gut, RN Outcome: Progressing 08/29/2023 1924 by Waneta Gut, RN Outcome: Progressing Goal: Individualized Educational Video(s) 08/29/2023 1925 by Waneta Gut, RN Outcome: Progressing 08/29/2023 1925 by Waneta Gut, RN Outcome: Progressing 08/29/2023 1924 by Waneta Gut, RN Outcome: Progressing   Problem: Activity: Goal: Ability to tolerate increased activity will improve 08/29/2023 1925 by Waneta Gut, RN Outcome: Progressing 08/29/2023 1925 by Waneta Gut, RN Outcome: Progressing 08/29/2023 1924 by Waneta Gut, RN Outcome: Progressing Goal: Will verbalize the importance of balancing activity with adequate rest periods 08/29/2023 1925 by Waneta Gut, RN Outcome: Progressing 08/29/2023 1925 by Waneta Gut, RN Outcome: Progressing 08/29/2023 1924 by Waneta Gut, RN Outcome: Progressing   Problem: Respiratory: Goal: Ability to maintain a clear airway will improve 08/29/2023 1925 by Waneta Gut, RN Outcome: Progressing 08/29/2023 1925 by Waneta Gut, RN Outcome: Progressing 08/29/2023 1924 by Waneta Gut, RN Outcome: Progressing Goal: Levels of oxygenation will improve 08/29/2023 1925 by Waneta Gut, RN Outcome: Progressing 08/29/2023 1925 by Waneta Gut, RN Outcome: Progressing 08/29/2023 1924 by Waneta Gut, RN Outcome: Progressing Goal: Ability to maintain adequate ventilation will improve 08/29/2023 1925 by Waneta Gut, RN Outcome: Progressing 08/29/2023 1925 by Waneta Gut, RN Outcome: Progressing 08/29/2023 1924 by Waneta Gut, RN Outcome: Progressing

## 2023-08-29 NOTE — Plan of Care (Signed)
  Problem: Education: Goal: Knowledge of General Education information will improve Description: Including pain rating scale, medication(s)/side effects and non-pharmacologic comfort measures Outcome: Progressing   Problem: Health Behavior/Discharge Planning: Goal: Ability to manage health-related needs will improve Outcome: Progressing   Problem: Clinical Measurements: Goal: Ability to maintain clinical measurements within normal limits will improve Outcome: Progressing Goal: Will remain free from infection Outcome: Progressing Goal: Diagnostic test results will improve Outcome: Progressing Goal: Respiratory complications will improve Outcome: Progressing Goal: Cardiovascular complication will be avoided Outcome: Progressing   Problem: Activity: Goal: Risk for activity intolerance will decrease Outcome: Progressing   Problem: Nutrition: Goal: Adequate nutrition will be maintained Outcome: Progressing   Problem: Coping: Goal: Level of anxiety will decrease Outcome: Progressing   Problem: Elimination: Goal: Will not experience complications related to bowel motility Outcome: Progressing Goal: Will not experience complications related to urinary retention Outcome: Progressing   Problem: Pain Managment: Goal: General experience of comfort will improve and/or be controlled Outcome: Progressing   Problem: Safety: Goal: Ability to remain free from injury will improve Outcome: Progressing   Problem: Skin Integrity: Goal: Risk for impaired skin integrity will decrease Outcome: Progressing   Problem: Education: Goal: Ability to describe self-care measures that may prevent or decrease complications (Diabetes Survival Skills Education) will improve Outcome: Progressing Goal: Individualized Educational Video(s) Outcome: Progressing   Problem: Coping: Goal: Ability to adjust to condition or change in health will improve Outcome: Progressing   Problem: Fluid  Volume: Goal: Ability to maintain a balanced intake and output will improve Outcome: Progressing   Problem: Health Behavior/Discharge Planning: Goal: Ability to identify and utilize available resources and services will improve Outcome: Progressing Goal: Ability to manage health-related needs will improve Outcome: Progressing   Problem: Metabolic: Goal: Ability to maintain appropriate glucose levels will improve Outcome: Progressing   Problem: Nutritional: Goal: Maintenance of adequate nutrition will improve Outcome: Progressing Goal: Progress toward achieving an optimal weight will improve Outcome: Progressing   Problem: Skin Integrity: Goal: Risk for impaired skin integrity will decrease Outcome: Progressing   Problem: Tissue Perfusion: Goal: Adequacy of tissue perfusion will improve Outcome: Progressing   Problem: Education: Goal: Ability to demonstrate management of disease process will improve Outcome: Progressing Goal: Ability to verbalize understanding of medication therapies will improve Outcome: Progressing Goal: Individualized Educational Video(s) Outcome: Progressing   Problem: Activity: Goal: Capacity to carry out activities will improve Outcome: Progressing   Problem: Cardiac: Goal: Ability to achieve and maintain adequate cardiopulmonary perfusion will improve Outcome: Progressing   Problem: Education: Goal: Knowledge of disease or condition will improve Outcome: Progressing Goal: Knowledge of the prescribed therapeutic regimen will improve Outcome: Progressing Goal: Individualized Educational Video(s) Outcome: Progressing   Problem: Activity: Goal: Ability to tolerate increased activity will improve Outcome: Progressing Goal: Will verbalize the importance of balancing activity with adequate rest periods Outcome: Progressing   Problem: Respiratory: Goal: Ability to maintain a clear airway will improve Outcome: Progressing Goal: Levels of  oxygenation will improve Outcome: Progressing Goal: Ability to maintain adequate ventilation will improve Outcome: Progressing

## 2023-08-29 NOTE — Progress Notes (Addendum)
 Heart Failure Stewardship Pharmacy Note  PCP: Rockney Cid, DO PCP-Cardiologist: Constancia Delton, MD   HPI: Molly Howard is a 38 y.o. female with combined systolic and diastolic CHF, stage II chronic kidney disease, type 2 diabetes mellitus, hypertension, and nonischemic cardiomyopathy, GERD as well as polysubstance abuse who presented with worsening shortness of breath and swelling. Multiple recent admissions noted. Recently seen by CHF clinic where she was noted to be hypervolemic and not taking medications. She reports nonadherence to CHF medications because she is homeless and does not have a bathroom close by the park she stays. On admission, BNP 733.6, HS-troponin was 23, and UDS was negative. Chest x-ray noted cardiomegaly with mild interstitial edema. CT head noted no acute issues, but remote left frontal and cerebella infarcts.   Pertinent Cardiac History: Noted to have CHF in 2021 where echo showed LVEF of 35-40%. Cardiac MRI in 05/2020 showed an EF of 42%, mild LV dilatation, RVEF 50%, with no delayed enhancement. Echo in 07/2022 with LVEF of 25 to 30%, mildly decreased RV systolic function, moderate to severe tricuspid regurgitation, and mild mitral regurgitation. RHC in 07/2022 with severely elevated RA pressure of 30, CO/CI low at 4.47/2, COP of 0.89, PAPI of 0.8,  and high PA pressures due to volume overload. Since, she has had multiple admissions for CHF. Admitted 02/09/22 due to acute on chronic HF. + cocaine. Admitted 07/24/22 due to decompensated HFrEF. Required milrinone  drip and diuresed from 116 kg >> 91 kg. Admitted 11/05/22 due to swelling and fluid retention in arms and legs along with some central chest tightness and shortness of breath. Required milrinone  for diuresis. Zio patch 12/2022 with 24 VT runs with no sustained arrhythmias. Received subcutaneous ICD 06/2023. Echocardiogram 08/07/23 showed LVEF of 30-35%, mild LVH, low normal RV function, mild MR, mild-moderate TR, mild  PR.    Pertinent Lab Values: Creat  Date Value Ref Range Status  12/14/2022 1.45 (H) 0.50 - 0.97 mg/dL Final   Creatinine, Ser  Date Value Ref Range Status  08/29/2023 1.41 (H) 0.44 - 1.00 mg/dL Final   BUN  Date Value Ref Range Status  08/29/2023 48 (H) 6 - 20 mg/dL Final  78/29/5621 15 6 - 20 mg/dL Final   Potassium  Date Value Ref Range Status  08/29/2023 4.7 3.5 - 5.1 mmol/L Final   Sodium  Date Value Ref Range Status  08/29/2023 136 135 - 145 mmol/L Final  06/13/2023 142 134 - 144 mmol/L Final   B Natriuretic Peptide  Date Value Ref Range Status  08/28/2023 733.6 (H) 0.0 - 100.0 pg/mL Final    Comment:    Performed at [facility], [address] [place], [place] [address]   Magnesium   Date Value Ref Range Status  08/28/2023 1.9 1.7 - 2.4 mg/dL Final    Comment:    Performed at [facility], [address] [place], [place] [address]   Hgb A1c MFr Bld  Date Value Ref Range Status  08/06/2023 6.7 (H) 4.8 - 5.6 % Final    Comment:    (NOTE)         Prediabetes: 5.7 - 6.4         Diabetes: >6.4         Glycemic control for adults with diabetes: <7.0    Digoxin  Level  Date Value Ref Range Status  04/20/2023 0.3 (L) 0.8 - 2.0 ng/mL Final    Comment:    Performed at [facility], [address] [place], [place] [address]   TSH  Date Value Ref Range Status  11/09/2021 5.114 (H) 0.350 - 4.500 uIU/mL Final    Comment:    Performed by a 3rd Generation assay with a functional sensitivity of <=0.01 uIU/mL. Performed at [facility], [address] [place], [place] [address]    LDH  Date Value Ref Range Status  04/15/2023 216 (H) 98 - 192 U/L Final    Comment:    Performed at [facility], [address] [place], [place] [address]    Vital Signs: Admission weight: 223 lbs Temp:  [97.9 F (36.6 C)-98.6 F (37 C)] 98.6 F (37 C) (04/28 1149) Pulse Rate:  [84-100] 85 (04/28 1149) Cardiac Rhythm:  Normal sinus rhythm (04/28 0818) Resp:  [18-28] 18 (04/28 1149) BP: (117-173)/(80-102) 145/89 (04/28 1149) SpO2:  [86 %-100 %] 94 % (04/28 1149) Weight:  [99 kg (218 lb 4.1 oz)] 99 kg (218 lb 4.1 oz) (04/28 0500)  Intake/Output Summary (Last 24 hours) at 08/29/2023 1151 Last data filed at 08/29/2023 0900 Gross per 24 hour  Intake 240 ml  Output 1400 ml  Net -1160 ml    Current Heart Failure Medications:  Loop diuretic: furosemide  40 mg IV BID Beta-Blocker: carvedilol  12.5 mg BID ACEI/ARB/ARNI:  none MRA: none SGLT2i: none Other: digoxin  0.0625 mg daily  Prior to admission Heart Failure Medications:  Not taking medications other than digoxin  Loop diuretic: torsemide  40 mg daily Beta-Blocker: carvedilol  12.5 mg BID  ACEI/ARB/ARNI: Entresto  24-26 mg BID MRA: spironolactone  25 mg daily SGLT2i: Farxiga  10 mg daily Other: digoxin  0.0625 mg daily   Assessment: 1. Acute on chronic systolic heart failure (LVEF 30-35%) with low normal RV function, due to NICM. NYHA class II symptoms.  -Symptoms: Reports shortness of breath, fatigue, and poor appetite.  -Volume: Charted urine output is not high. Weight is charted as down 2 lbs. Received Lasix  80 mg IV once yesterday, then torsemide  40 mg this morning before being changed to Lasix  40 mg IV BID today. Creatinine is up today. Will monitor creatinine trend and urine output -Hemodynamics: BP elevated today. HR 70s.  -BB: Continue carvedilol  12.5 mg daily. Could consider decreasing to 6.25 while hypervolemic. -ACEI/ARB/ARNI: Consider adding back Entresto  24-26 mg BID given hypertension.   -MRA: Consider adding back home spironolactone  25 mg daily when renal function and potassium are stable.  -SGLT2i: Consider adding back Farxiga  10 mg daily   Plan: 1) Medication changes recommended at this time: -Consider adding back home Entresto  to 24-26 mg BID. Will monitor creatinine trend.  -Consider adding back home Farxiga  10 mg daily   2) Patient  assistance: -Copays are $4, eligible to have copay waived through [facility] pharmacy   3) Education: - Patient has been educated on current HF medications and potential additions to HF medication regimen - Patient verbalizes understanding that over the next few months, these medication doses may change and more medications may be added to optimize HF regimen - Patient has been educated on basic disease state pathophysiology and goals of therapy   Medication Assistance / Insurance Benefits Check: Does the patient have prescription insurance?     Type of insurance plan:  Does the patient qualify for medication assistance through manufacturers or grants? No   Outpatient Pharmacy: Prior to admission outpatient pharmacy: [facility] ([place])   Please do not hesitate to reach out with questions or concerns,   Bevely Brush, PharmD, CPP, BCPS Heart Failure Pharmacist  Phone - [phone] 08/16/2023 9:45 AM  ADDENDUM:  Obtained ReDs reading, which was significantly elevated at 46%. Patient will require further diuresis. Discussed with Dr Tyler Gallant.

## 2023-08-29 NOTE — Telephone Encounter (Addendum)
 entered in error

## 2023-08-29 NOTE — Plan of Care (Signed)
 On room air while at rest o2 sat; 99% On room air while Ambulating o2 sat 95%

## 2023-08-29 NOTE — Telephone Encounter (Signed)
 H&V Care Navigation CSW Progress Note  Clinical Social Worker received call from pt requesting help getting to appt on Friday.  CSW set up [employer] cab to pick up at 1pm at [place] as pt is unhoused.  Patient is participating in a Managed Medicaid Plan:  Yes  SDOH Screenings   Food Insecurity: Food Insecurity Present (08/28/2023)  Housing: High Risk (08/28/2023)  Transportation Needs: Unmet Transportation Needs (08/29/2023)  Utilities: Not At Risk (08/28/2023)  Alcohol Screen: Low Risk  (04/15/2023)  Depression (PHQ2-9): Low Risk  (03/02/2023)  Financial Resource Strain: High Risk (08/25/2023)  Social Connections: Unknown (08/28/2023)  Tobacco Use: Medium Risk (08/28/2023)   08/29/2023  Molly Howard DOB: 1986/04/27 MRN: [id number]   RIDER WAIVER AND RELEASE OF LIABILITY  For the purposes of helping with transportation needs, [facility] partners with outside transportation providers (taxi companies, [place], Catering manager.) to give Molly Howard patients or other approved people the choice of on-demand rides [employer]") to our buildings for non-emergency visits.  By using [employer], I, the person signing this document, on behalf of myself and/or any legal minors (in my care using the [employer]), agree:  Science writer given to me are supplied by independent, outside transportation providers who do not work for, or have any affiliation with, [employer]. [facility] is not a transportation company. [facility] has no control over the quality or safety of the rides I get using [employer]. [place] has no control over whether any outside ride will happen on time or not. [facility] gives no guarantee on the reliability, quality, safety, or availability on any rides, or that no mistakes will happen. I know and accept that traveling by vehicle (car, truck, SVU, Molly Howard, bus, taxi, etc.) has risks of serious injuries such as disability, being paralyzed, and death. I  know and agree the risk of using [employer] is mine alone, and not [employer]. Transport Services are provided "as is" and as are available. The transportation providers are in charge for all inspections and care of the vehicles used to provide these rides. I agree not to take legal action against [place], its agents, employees, officers, directors, representatives, insurers, attorneys, assigns, successors, subsidiaries, and affiliates at any time for any reasons related directly or indirectly to using [employer]. I also agree not to take legal action against [facility] or its affiliates for any injury, death, or damage to property caused by or related to using [employer]. I have read this Waiver and Release of Liability, and I understand the terms used in it and their legal meaning. This Waiver is freely and voluntarily given with the understanding that my right (or any legal minors) to legal action against  relating to [employer] is knowingly given up to use these services.   I attest that I read the Ride Waiver and Release of Liability to Molly Howard, gave Molly Howard the opportunity to ask questions and answered the questions asked (if any). I affirm that Molly Howard then provided consent for assistance with transportation.    [name], LCSW Clinical Social Worker [facility] Failure Clinic Desk#: [phone] Cell#: [phone]

## 2023-08-29 NOTE — Assessment & Plan Note (Signed)
 When the patient does not take her water pills her creatinine usually lower and then 1 will give diuresis creatinine goes up to 1.41.  Continue to monitor with diuresis.

## 2023-08-29 NOTE — Inpatient Diabetes Management (Signed)
 Inpatient Diabetes Program Recommendations  AACE/ADA: New Consensus Statement on Inpatient Glycemic Control (2015)  Target Ranges:  Prepandial:   less than 140 mg/dL      Peak postprandial:   less than 180 mg/dL (1-2 hours)      Critically ill patients:  140 - 180 mg/dL   Lab Results  Component Value Date   GLUCAP 279 (H) 08/29/2023   HGBA1C 6.7 (H) 08/06/2023    Review of Glycemic Control  Latest Reference Range & Units 08/28/23 17:53 08/28/23 21:02 08/29/23 07:42  Glucose-Capillary 70 - 99 mg/dL 161 (H) 096 (H) 045 (H)  (H): Data is abnormally high Diabetes history: Type 2 DM Outpatient Diabetes medications: Ozempic  0.5 mg qwk, Farxiga  10 mg QD Current orders for Inpatient glycemic control: Novolog  0-9 units TID Solumedrol 125 mg x 1 tapered to Prednisone  40 mg QD  Inpatient Diabetes Program Recommendations:    In the setting of steroids, consider: Novolog  3 units TID (assuming patient consuming >50% of meals)  Thanks, Marjo Sievert, MSN, RNC-OB Diabetes Coordinator [phone] (8a-5p)

## 2023-08-29 NOTE — TOC Progression Note (Signed)
 Transition of Care [facility]) - Progression Note    Patient Details  Name: Molly Howard MRN: [id number] Date of Birth: [birth year]  Transition of Care [facility]) CM/SW Contact  Baird Bombard, RN Phone Number: 08/29/2023, 4:24 PM  Clinical Narrative:    Spoke with patient at bedside. She inquired about housing. Patient is homeless and has nowhere to discharge to. She stated she had been living at [place] and will need a ride back there. She is unable to stay with her sister in her car. Her father won't take her in either. She stated she is unable to discharge to shelter. "They wont let me in because of my medical condition." Patient advised she would be provided with homelessness resources, and a cab will be provided at discharge.         Expected Discharge Plan and Services                                               Social Determinants of Health (SDOH) Interventions SDOH Screenings   Food Insecurity: Food Insecurity Present (08/28/2023)  Housing: High Risk (08/28/2023)  Transportation Needs: Unmet Transportation Needs (08/29/2023)  Utilities: Not At Risk (08/28/2023)  Alcohol Screen: Low Risk  (04/15/2023)  Depression (PHQ2-9): Low Risk  (03/02/2023)  Financial Resource Strain: High Risk (08/25/2023)  Social Connections: Unknown (08/28/2023)  Tobacco Use: Medium Risk (08/28/2023)    Readmission Risk Interventions    11/08/2022    3:23 PM 07/25/2022   10:35 AM 02/15/2022    2:34 PM  Readmission Risk Prevention Plan  Transportation Screening Complete Complete Complete  PCP or Specialist Appt within 3-5 Days  Complete Complete  Social Work Consult for Recovery Care Planning/Counseling  Complete Complete  Palliative Care Screening  Not Applicable Not Applicable  Medication Review Oceanographer) Complete Complete Complete  PCP or Specialist appointment within 3-5 days of discharge Complete    SW Recovery Care/Counseling Consult Complete    Palliative Care Screening Not  Applicable    Skilled Nursing Facility Not Applicable

## 2023-08-29 NOTE — Progress Notes (Signed)
 Progress Note   Patient: Molly Howard UEA:[id number] DOB: 10-25-1985 DOA: 08/28/2023     0 DOS: the patient was seen and examined on 08/29/2023   Brief hospital course: 38 y.o. female with medical history significant of homelessness, chronic HFrEF status post AICD, type 2 diabetes, polysubstance abuse, obesity presenting with acute on chronic HFrEF, COPD exacerbation.  Patient reports increased work of breathing over the past 3 to 4 days.  Noted to have been admitted April 14 through 15 for syncope secondary to dehydration.  Patient states that she has torsemide  dosing.  However because she does not live near a bathroom, she is hesitant take the medication because she does not have anywhere to fully urinate.  Has not taken medication in at least 1 to 2 weeks.  Positive worsening swelling.  Also with cough and wheezing.  Noted prior extensive prior smoking history.  Quit within the past 2 to 4 weeks.  No reported tobacco use.  Denies any recent polysubstance abuse though remote history of cocaine use.  Minimal chest pain.  No focal hemiparesis or confusion.  No abdominal pain or diarrhea. Presented to the ER afebrile, hemodynamically stable.  Satting well on room air.  4/28.  Patient feels that she still has weight on her.  She states that she does not take her water pills as outpatient.  Patient received 40 mg of torsemide  this morning and switch over to Lasix  60 mg IV this morning also.  Since creatinine went up we will get a Reds vest reading and determine further diuresis or not.  Assessment and Plan: * Acute on chronic HFrEF (heart failure with reduced ejection fraction) (HCC) 2D echo April 2025 with EF of 30 to 35% S/p AICD  Patient received torsemide  40 mg this morning and I did change to Lasix  60 IV once. Restart Farxiga , hold Aldactone .  Restart Entresto  this evening.    COPD exacerbation (HCC) Patient given 125 mg of Solu-Medrol  x 2 doses and then will be on prednisone .  Diabetes  mellitus, type II (HCC) With hyperglycemia Sugar is up secondary to high-dose steroids given.  Previous hemoglobin A1c 6.7.  Restarted Jardiance.  Essential hypertension Continue Coreg , restart Entresto   GERD without esophagitis PPI    Acute kidney injury superimposed on CKD (HCC) When the patient does not take her water pills her creatinine usually lower and then 1 will give diuresis creatinine goes up to 1.41.  Continue to monitor with diuresis.  Polysubstance abuse (HCC) Urine toxicology negative        Subjective: Patient still feeling short of breath with walking around and still feeling swollen.  Patient states that she does not take her water pills because she has to walk all the way to the bathroom which is across the park.  Physical Exam: Vitals:   08/29/23 0450 08/29/23 0500 08/29/23 0741 08/29/23 1149  BP: (!) 147/89  (!) 173/102 (!) 145/89  Pulse: 84  90 85  Resp: 18  19 18   Temp: 97.9 F (36.6 C)  98.6 F (37 C) 98.6 F (37 C)  TempSrc:      SpO2: 97%  91% 94%  Weight:  99 kg    Height:       Physical Exam HENT:     Head: Normocephalic.     Mouth/Throat:     Pharynx: No oropharyngeal exudate.  Eyes:     General: Lids are normal.     Conjunctiva/sclera: Conjunctivae normal.  Cardiovascular:     Rate and  Rhythm: Normal rate and regular rhythm.     Heart sounds: Normal heart sounds, S1 normal and S2 normal.  Pulmonary:     Breath sounds: Examination of the right-lower field reveals decreased breath sounds. Examination of the left-lower field reveals decreased breath sounds. Decreased breath sounds present. No wheezing, rhonchi or rales.  Abdominal:     Palpations: Abdomen is soft.     Tenderness: There is no abdominal tenderness.  Musculoskeletal:     Right lower leg: Swelling present.     Left lower leg: Swelling present.  Skin:    General: Skin is warm.     Findings: No rash.  Neurological:     Mental Status: She is alert and oriented to  person, place, and time.     Data Reviewed: Creatinine up to 1.41 today, glucose 356   Disposition: Status is: Observation Continue diuresis today and watch creatinine.  Change Lasix  to 60 mg IV this morning.  Planned Discharge Destination: Home    Time spent: 28 minutes  Author: Verla Glaze, MD 08/29/2023 12:22 PM  For on call review [email or web address].

## 2023-08-29 NOTE — Telephone Encounter (Signed)
 Sign encounter

## 2023-08-30 DIAGNOSIS — I5023 Acute on chronic systolic (congestive) heart failure: Secondary | ICD-10-CM | POA: Diagnosis not present

## 2023-08-30 DIAGNOSIS — E669 Obesity, unspecified: Secondary | ICD-10-CM

## 2023-08-30 DIAGNOSIS — E1165 Type 2 diabetes mellitus with hyperglycemia: Secondary | ICD-10-CM | POA: Diagnosis not present

## 2023-08-30 DIAGNOSIS — I10 Essential (primary) hypertension: Secondary | ICD-10-CM | POA: Diagnosis not present

## 2023-08-30 DIAGNOSIS — J441 Chronic obstructive pulmonary disease with (acute) exacerbation: Secondary | ICD-10-CM | POA: Diagnosis not present

## 2023-08-30 LAB — GLUCOSE, CAPILLARY
Glucose-Capillary: 129 mg/dL — ABNORMAL HIGH (ref 70–99)
Glucose-Capillary: 330 mg/dL — ABNORMAL HIGH (ref 70–99)
Glucose-Capillary: 319 mg/dL — ABNORMAL HIGH (ref 70–99)
Glucose-Capillary: 136 mg/dL — ABNORMAL HIGH (ref 70–99)

## 2023-08-30 LAB — BASIC METABOLIC PANEL WITH GFR
Anion gap: 11 (ref 5–15)
BUN: 56 mg/dL — ABNORMAL HIGH (ref 6–20)
CO2: 23 mmol/L (ref 22–32)
Calcium: 8.5 mg/dL — ABNORMAL LOW (ref 8.9–10.3)
Chloride: 102 mmol/L (ref 98–111)
Creatinine, Ser: 1.28 mg/dL — ABNORMAL HIGH (ref 0.44–1.00)
GFR, Estimated: 55 mL/min — ABNORMAL LOW (ref >60)
Glucose, Bld: 180 mg/dL — ABNORMAL HIGH (ref 70–99)
Potassium: 4 mmol/L (ref 3.5–5.1)
Sodium: 136 mmol/L (ref 135–145)

## 2023-08-30 MED ORDER — INSULIN ASPART 100 UNIT/ML IJ SOLN
3.0000 [IU] | Freq: Three times a day (TID) | INTRAMUSCULAR | Status: DC
  Administered 2023-08-30 – 2023-09-01 (×6): 3 [IU] via SUBCUTANEOUS
  Filled 2023-08-30 (×6): qty 1

## 2023-08-30 MED ORDER — SPIRONOLACTONE 25 MG PO TABS
25.0000 mg | ORAL_TABLET | Freq: Every day | ORAL | Status: DC
  Administered 2023-08-30 – 2023-09-01 (×3): 25 mg via ORAL
  Filled 2023-08-30 (×3): qty 1

## 2023-08-30 MED ORDER — FUROSEMIDE 10 MG/ML IJ SOLN
60.0000 mg | Freq: Two times a day (BID) | INTRAMUSCULAR | Status: DC
  Administered 2023-08-30 – 2023-09-01 (×5): 60 mg via INTRAVENOUS
  Filled 2023-08-30 (×5): qty 6

## 2023-08-30 NOTE — Progress Notes (Signed)
 ReDS Vest / Clip - 08/30/23 0908       ReDS Vest / Clip   Station Marker D    Ruler Value 32    ReDS Value Range Moderate volume overload    ReDS Actual Value 38            Weight today performed -215 pounds  Celedonio Coil, Charity fundraiser, BSN [facility] Heart Failure Navigator Secure Chat Only

## 2023-08-30 NOTE — Progress Notes (Signed)
 Progress Note   Patient: Molly Howard:[id number] DOB: March 07, 1986 DOA: 08/28/2023     1 DOS: the patient was seen and examined on 08/30/2023   Brief hospital course: 38 y.o. female with medical history significant of homelessness, chronic HFrEF status post AICD, type 2 diabetes, polysubstance abuse, obesity presenting with acute on chronic HFrEF, COPD exacerbation.  Patient reports increased work of breathing over the past 3 to 4 days.  Noted to have been admitted April 14 through 15 for syncope secondary to dehydration.  Patient states that she has torsemide  dosing.  However because she does not live near a bathroom, she is hesitant take the medication because she does not have anywhere to fully urinate.  Has not taken medication in at least 1 to 2 weeks.  Positive worsening swelling.  Also with cough and wheezing.  Noted prior extensive prior smoking history.  Quit within the past 2 to 4 weeks.  No reported tobacco use.  Denies any recent polysubstance abuse though remote history of cocaine use.  Minimal chest pain.  No focal hemiparesis or confusion.  No abdominal pain or diarrhea. Presented to the ER afebrile, hemodynamically stable.  Satting well on room air.  4/28.  Patient feels that she still has weight on her.  She states that she does not take her water pills as outpatient.  Patient received 40 mg of torsemide  this morning and switch over to Lasix  60 mg IV this morning also.  Reds vest reading 40.  Creatinine went up to 1.41. 4/29.  Today's creatinine 1.28.  Continue Lasix  60 mg IV twice daily.  Restart Aldactone .  Creatinine down to 1.28.  Reds vest reading 38.   Assessment and Plan: * Acute on chronic HFrEF (heart failure with reduced ejection fraction) (HCC) 2D echo April 2025 with EF of 30 to 35% S/p AICD  Patient's reds vest reading 38 today.  Continue diuresis with Lasix  60 mg IV twice daily.  Continue low-dose Entresto , Farxiga  and Coreg .  Restart Aldactone .  Patient also on  digoxin .  Patient's weight 217    COPD exacerbation (HCC) Patient given 125 mg of Solu-Medrol  x 2 doses and then will be on prednisone .  Diabetes mellitus, type II (HCC) With hyperglycemia Sugar is up secondary to high-dose steroids given.  Previous hemoglobin A1c 6.7.  Restarted Jardiance.  Sliding scale insulin  +3 units prior to meals.  Essential hypertension Continue Coreg , Entresto , spironolactone   GERD without esophagitis PPI    Acute kidney injury superimposed on CKD (HCC) When the patient does not take her water pills her creatinine usually lower (1.08) and then with diuresis creatinine goes up to 1.41.  Continue to monitor with diuresis.  Today's creatinine 1.28.  Obesity (BMI 30-39.9) BMI 33.99  Polysubstance abuse (HCC) Urine toxicology negative        Subjective: Patient is short of breath.  Was placed on oxygen overnight.  Today's creatinine down to 1.28.  Physical Exam: Vitals:   08/30/23 0500 08/30/23 0517 08/30/23 0734 08/30/23 0908  BP:  128/78 125/75   Pulse:  86 82   Resp:  18 18   Temp:  98.5 F (36.9 C) 97.8 F (36.6 C)   TempSrc:  Oral    SpO2:  98% 93%   Weight: 98.6 kg   98.4 kg  Height:    5\' 7"  (1.702 m)   Physical Exam HENT:     Head: Normocephalic.     Mouth/Throat:     Pharynx: No oropharyngeal exudate.  Eyes:  General: Lids are normal.     Conjunctiva/sclera: Conjunctivae normal.  Cardiovascular:     Rate and Rhythm: Normal rate and regular rhythm.     Heart sounds: Normal heart sounds, S1 normal and S2 normal.  Pulmonary:     Breath sounds: Examination of the right-lower field reveals decreased breath sounds. Examination of the left-lower field reveals decreased breath sounds. Decreased breath sounds present. No wheezing, rhonchi or rales.  Abdominal:     Palpations: Abdomen is soft.     Tenderness: There is no abdominal tenderness.  Musculoskeletal:     Right lower leg: Swelling present.     Left lower leg: Swelling  present.  Skin:    General: Skin is warm.     Findings: No rash.  Neurological:     Mental Status: She is alert and oriented to person, place, and time.     Data Reviewed: Creatinine 1.28, potassium 4.0  Disposition: Status is: Inpatient Remains inpatient appropriate because: With Reds vest reading of 38 continue IV diuresis with Lasix  60 mg IV twice daily.  Planned Discharge Destination: Patient is homeless    Time spent: 28 minutes  Author: Verla Glaze, MD 08/30/2023 10:35 AM  For on call review [email or web address].

## 2023-08-30 NOTE — Assessment & Plan Note (Signed)
 BMI 33.99

## 2023-08-30 NOTE — TOC Progression Note (Signed)
 Transition of Care [facility]) - Progression Note    Patient Details  Name: Molly Howard MRN: [id number] Date of Birth: 09-18-1985  Transition of Care [facility]) CM/SW Contact  Baird Bombard, RN Phone Number: 08/30/2023, 2:35 PM  Clinical Narrative:    Homelessness resources added to AVS.   RNCM contacted [facility] to inquire about criteria for admittance and other resources.          Expected Discharge Plan and Services                                               Social Determinants of Health (SDOH) Interventions SDOH Screenings   Food Insecurity: Food Insecurity Present (08/28/2023)  Housing: High Risk (08/28/2023)  Transportation Needs: Unmet Transportation Needs (08/29/2023)  Utilities: Not At Risk (08/28/2023)  Alcohol Screen: Low Risk  (04/15/2023)  Depression (PHQ2-9): Low Risk  (03/02/2023)  Financial Resource Strain: High Risk (08/25/2023)  Social Connections: Unknown (08/28/2023)  Tobacco Use: Medium Risk (08/28/2023)    Readmission Risk Interventions    11/08/2022    3:23 PM 07/25/2022   10:35 AM 02/15/2022    2:34 PM  Readmission Risk Prevention Plan  Transportation Screening Complete Complete Complete  PCP or Specialist Appt within 3-5 Days  Complete Complete  Social Work Consult for Recovery Care Planning/Counseling  Complete Complete  Palliative Care Screening  Not Applicable Not Applicable  Medication Review [facility]) Complete Complete Complete  PCP or Specialist appointment within 3-5 days of discharge Complete    SW Recovery Care/Counseling Consult Complete    Palliative Care Screening Not Applicable    Skilled Nursing Facility Not Applicable

## 2023-08-30 NOTE — Discharge Instructions (Signed)
Shelters Resource List  [employer] [facility] PROVIDED BY: [facility] [address] [place], [place], [place] Offers a faith-based shelter for homeless men, usually with substance use disorders. Residents receive counseling, life skills training, and help finding a job.  HOMELESS SHELTER PROVIDED BY: [facility] [address] [place], [place], [place] Offers a shelter for men, women, and families experiencing homelessness. Food, clothing and other items are available for residents. Also offers support and services to help residents become self-sufficient. Offers temporary emergency housing for 30 days. Additional shelter may be available when temperatures drop below freezing but is not guaranteed.  FAMILY ABUSE SERVICES OF [facility] COUNTY PROVIDED BY: FAMILY ABUSE SERVICES OF [facility] [address], [place], [place] Offers services for victims of domestic violence. Offers a 24-hour crisis line and emergency shelter. Offers information and referrals to other community resources. Also offers court advocacy and support groups.  HOUSING CHOICE VOUCHER PROGRAM PROVIDED BY: [facility] [place] [address], [place], [place] Offers vouchers for approved Section 8 properties. Vouchers offer financial help with rent    [employer] PROVIDED BY: [facility], [place], [place] Offers emergency shelter for victims of domestic violence. Also offers a 24-hour crisis hotline for victims of domestic violence, safety planning, information and referrals, case management, and support groups for victims of domestic violence.   ACT TOGETHER EMERGENCY SHELTER PROVIDED BY: [facility] [address], [place], [place] Offers a 21-day emergency shelter for youth experiencing a family crisis, abuse, or homelessness. Case management, supportive services, healthcare services, and more are available for  residents. SHELTER PROVIDED BY: [facility] [address], [place], [place] Offers a homeless shelter for people and families. Meals, showers, community referrals, case management, and more are available for residents. HEARTH TRANSITIONAL LIVING PROGRAM PROVIDED BY: [facility] [address], [place], [place] Offers an 42-month homeless shelter for younger adults experiencing homelessness. Case management, independent living skills education, and more are available for residents. [facility] PROVIDED BY: [facility] [facility] [address], [place], [place] Offers transitional housing for families and single people experiencing homelessness. Residents meet regularly with a case manager to work towards self-sufficiency TRANSITIONAL HOUSING PROVIDED BY: [facility] [address], [place], [place] Offers transitional housing for female veterans with disabilities. Residents receive meals, transportation, and clothing. Also offers support groups, nutrition classes, and peer support to residents.   [facility] HOUSE PROVIDED BY: [place] [facility] [address], [place], [place] Offers shelter to adult men and women. Guests receive hot meals and case management. Also offers overnight shelter when temperatures drop during cold winter months  EMERGENCY FAMILY SHELTER PROVIDED BY: [facility] [address], [place], [place] Offers shelter and support services for families experiencing homelessness.

## 2023-08-30 NOTE — Progress Notes (Signed)
 Heart Failure Navigator Progress Note  Assessed for Heart & Vascular TOC clinic readiness. Patient does not meet criteria due to current Advanced Heart Failure Team patient. Hospital follow-up already scheduled for 09/02/2023 @ 1:30 PM after a recent scheduled HF appointment.  Patient aware of appointment details. Per patient transportation set up by CSW for follow-up appointment.  Navigator will sign off at this time.  Celedonio Coil, RN, BSN [facility] Heart Failure Navigator Secure Chat Only

## 2023-08-30 NOTE — Progress Notes (Signed)
 Heart Failure Stewardship Pharmacy Note  PCP: Rockney Cid, DO PCP-Cardiologist: Constancia Delton, MD   HPI: Molly Howard is a 38 y.o. female with combined systolic and diastolic CHF, stage II chronic kidney disease, type 2 diabetes mellitus, hypertension, and nonischemic cardiomyopathy, GERD as well as polysubstance abuse who presented with worsening shortness of breath and swelling. Multiple recent admissions noted. Recently seen by CHF clinic where she was noted to be hypervolemic and not taking medications. She reports nonadherence to CHF medications because she is homeless and does not have a bathroom close by the park she stays. On admission, BNP 733.6, HS-troponin was 23, and UDS was negative. Chest x-ray noted cardiomegaly with mild interstitial edema. CT head noted no acute issues, but remote left frontal and cerebella infarcts.   Pertinent Cardiac History: Noted to have CHF in 2021 where echo showed LVEF of 35-40%. Cardiac MRI in 05/2020 showed an EF of 42%, mild LV dilatation, RVEF 50%, with no delayed enhancement. Echo in 07/2022 with LVEF of 25 to 30%, mildly decreased RV systolic function, moderate to severe tricuspid regurgitation, and mild mitral regurgitation. RHC in 07/2022 with severely elevated RA pressure of 30, CO/CI low at 4.47/2, COP of 0.89, PAPI of 0.8,  and high PA pressures due to volume overload. Since, she has had multiple admissions for CHF. Admitted 02/09/22 due to acute on chronic HF. + cocaine. Admitted 07/24/22 due to decompensated HFrEF. Required milrinone  drip and diuresed from 116 kg >> 91 kg. Admitted 11/05/22 due to swelling and fluid retention in arms and legs along with some central chest tightness and shortness of breath. Required milrinone  for diuresis. Zio patch 12/2022 with 24 VT runs with no sustained arrhythmias. Received subcutaneous ICD 06/2023. Echocardiogram 08/07/23 showed LVEF of 30-35%, mild LVH, low normal RV function, mild MR, mild-moderate TR, mild  PR.    Pertinent Lab Values: Creat  Date Value Ref Range Status  12/14/2022 1.45 (H) 0.50 - 0.97 mg/dL Final   Creatinine, Ser  Date Value Ref Range Status  08/30/2023 1.28 (H) 0.44 - 1.00 mg/dL Final   BUN  Date Value Ref Range Status  08/30/2023 56 (H) 6 - 20 mg/dL Final  91/47/8295 15 6 - 20 mg/dL Final   Potassium  Date Value Ref Range Status  08/30/2023 4.0 3.5 - 5.1 mmol/L Final   Sodium  Date Value Ref Range Status  08/30/2023 136 135 - 145 mmol/L Final  06/13/2023 142 134 - 144 mmol/L Final   B Natriuretic Peptide  Date Value Ref Range Status  08/28/2023 733.6 (H) 0.0 - 100.0 pg/mL Final    Comment:    Performed at [facility], [address] [place], [place] [address]   Magnesium   Date Value Ref Range Status  08/28/2023 1.9 1.7 - 2.4 mg/dL Final    Comment:    Performed at [facility], [address] [place], [place] [address]   Hgb A1c MFr Bld  Date Value Ref Range Status  08/06/2023 6.7 (H) 4.8 - 5.6 % Final    Comment:    (NOTE)         Prediabetes: 5.7 - 6.4         Diabetes: >6.4         Glycemic control for adults with diabetes: <7.0    Digoxin  Level  Date Value Ref Range Status  04/20/2023 0.3 (L) 0.8 - 2.0 ng/mL Final    Comment:    Performed at [facility], [address] [place], [place] [address]   TSH  Date Value Ref Range Status  11/09/2021 5.114 (H) 0.350 - 4.500 uIU/mL Final    Comment:    Performed by a 3rd Generation assay with a functional sensitivity of <=0.01 uIU/mL. Performed at [facility], [address] [place], [place] [address]    LDH  Date Value Ref Range Status  04/15/2023 216 (H) 98 - 192 U/L Final    Comment:    Performed at [facility], [address] [place], [place] [address]    Vital Signs: Admission weight: 223 lbs Temp:  [97.4 F (36.3 C)-98.7 F (37.1 C)] 97.8 F (36.6 C) (04/29 0734) Pulse Rate:  [79-86] 82 (04/29 0734) Cardiac Rhythm:  Normal sinus rhythm (04/28 1953) Resp:  [18] 18 (04/29 0734) BP: (125-154)/(75-89) 125/75 (04/29 0734) SpO2:  [93 %-98 %] 93 % (04/29 0734) Weight:  [98.4 kg (217 lb)-98.6 kg (217 lb 6 oz)] 98.4 kg (217 lb) (04/29 0908)  Intake/Output Summary (Last 24 hours) at 08/30/2023 0913 Last data filed at 08/30/2023 0517 Gross per 24 hour  Intake 480 ml  Output 1300 ml  Net -820 ml    Current Heart Failure Medications:  Loop diuretic: furosemide  60 mg IV BID Beta-Blocker: carvedilol  12.5 mg BID ACEI/ARB/ARNI:  Entresto  24-26 mg BID MRA: spironolactone  25 mg daily SGLT2i: Farxiga  10 mg daily Other: digoxin  0.0625 mg daily  Prior to admission Heart Failure Medications:  Not taking medications other than digoxin  Loop diuretic: torsemide  40 mg daily Beta-Blocker: carvedilol  12.5 mg BID  ACEI/ARB/ARNI: Entresto  24-26 mg BID MRA: spironolactone  25 mg daily SGLT2i: Farxiga  10 mg daily Other: digoxin  0.0625 mg daily   Assessment: 1. Acute on chronic systolic heart failure (LVEF 30-35%) with low normal RV function, due to NICM. NYHA class II symptoms.  -Symptoms: Reports shortness of breath, fatigue, and poor appetite are persistent.  -Volume: Still hypervolemic, ReDs of 38%. Charted urine output is not high. Weight done by CHF team today is 215.7 lbs, down ~8 lbs from admission. Currently on Lasix  60 mg IV BID today. Creatinine is trending down today, though BUN is elevated. Will monitor creatinine trend and urine output. -Hemodynamics: BP better controlled today. HR 70s.  -BB: Continue carvedilol  12.5 mg daily. Would not titrate while hypervolemic. -ACEI/ARB/ARNI: Continue Entresto  24-26 mg BID.   -MRA: Continue spironolactone  25 mg daily. -SGLT2i: Continue Farxiga  10 mg daily   Plan: 1) Medication changes recommended at this time: -Agree with adding back spironolactone .   2) Patient assistance: -Copays are $4, eligible to have copay waived through [facility] pharmacy   3) Education: -  Patient has been educated on current HF medications and potential additions to HF medication regimen - Patient verbalizes understanding that over the next few months, these medication doses may change and more medications may be added to optimize HF regimen - Patient has been educated on basic disease state pathophysiology and goals of therapy   Medication Assistance / Insurance Benefits Check: Does the patient have prescription insurance?     Type of insurance plan:  Does the patient qualify for medication assistance through manufacturers or grants? No   Outpatient Pharmacy: Prior to admission outpatient pharmacy: [facility]   Please do not hesitate to reach out with questions or concerns,   Bevely Brush, PharmD, CPP, BCPS Heart Failure Pharmacist  Phone - [phone]

## 2023-08-31 ENCOUNTER — Telehealth (HOSPITAL_COMMUNITY): Payer: Self-pay | Admitting: Licensed Clinical Social Worker

## 2023-08-31 DIAGNOSIS — E1159 Type 2 diabetes mellitus with other circulatory complications: Secondary | ICD-10-CM

## 2023-08-31 DIAGNOSIS — E871 Hypo-osmolality and hyponatremia: Secondary | ICD-10-CM | POA: Insufficient documentation

## 2023-08-31 DIAGNOSIS — J441 Chronic obstructive pulmonary disease with (acute) exacerbation: Secondary | ICD-10-CM | POA: Diagnosis not present

## 2023-08-31 DIAGNOSIS — E66811 Obesity, class 1: Secondary | ICD-10-CM | POA: Insufficient documentation

## 2023-08-31 DIAGNOSIS — I5023 Acute on chronic systolic (congestive) heart failure: Secondary | ICD-10-CM | POA: Diagnosis not present

## 2023-08-31 LAB — GLUCOSE, CAPILLARY
Glucose-Capillary: 91 mg/dL (ref 70–99)
Glucose-Capillary: 181 mg/dL — ABNORMAL HIGH (ref 70–99)
Glucose-Capillary: 285 mg/dL — ABNORMAL HIGH (ref 70–99)
Glucose-Capillary: 310 mg/dL — ABNORMAL HIGH (ref 70–99)

## 2023-08-31 LAB — BASIC METABOLIC PANEL WITH GFR
Anion gap: 9 (ref 5–15)
BUN: 49 mg/dL — ABNORMAL HIGH (ref 6–20)
CO2: 25 mmol/L (ref 22–32)
Calcium: 8.1 mg/dL — ABNORMAL LOW (ref 8.9–10.3)
Chloride: 105 mmol/L (ref 98–111)
Creatinine, Ser: 1.17 mg/dL — ABNORMAL HIGH (ref 0.44–1.00)
GFR, Estimated: >60 mL/min (ref >60)
Glucose, Bld: 90 mg/dL (ref 70–99)
Potassium: 3.8 mmol/L (ref 3.5–5.1)
Sodium: 139 mmol/L (ref 135–145)

## 2023-08-31 NOTE — Telephone Encounter (Signed)
 CSW received call from Celedonio Coil, HF Navigator requesting CSW assistance with advocating for patient at the homeless shelter. Patient is currently hospitalized although will be homeless when discharged. She contacted [facility] [phone] and was told that due to health issues she could not be admitted there post hospital. CSW contacted shelter to advocate for patient and assist with providing letter from provider regarding health status. Message left for return call. Aubry Blase, LCSW, CCSW-MCS [phone]

## 2023-08-31 NOTE — Progress Notes (Signed)
 Heart Failure Stewardship Pharmacy Note  PCP: Rockney Cid, DO PCP-Cardiologist: Constancia Delton, MD   HPI: Molly Howard is a 38 y.o. female with combined systolic and diastolic CHF, stage II chronic kidney disease, type 2 diabetes mellitus, hypertension, and nonischemic cardiomyopathy, GERD as well as polysubstance abuse who presented with worsening shortness of breath and swelling. Multiple recent admissions noted. Recently seen by CHF clinic where she was noted to be hypervolemic and not taking medications. She reports nonadherence to CHF medications because she is homeless and does not have a bathroom close by the park she stays. On admission, BNP 733.6, HS-troponin was 23, and UDS was negative. Chest x-ray noted cardiomegaly with mild interstitial edema. CT head noted no acute issues, but remote left frontal and cerebella infarcts.   Pertinent Cardiac History: Noted to have CHF in 2021 where echo showed LVEF of 35-40%. Cardiac MRI in 05/2020 showed an EF of 42%, mild LV dilatation, RVEF 50%, with no delayed enhancement. Echo in 07/2022 with LVEF of 25 to 30%, mildly decreased RV systolic function, moderate to severe tricuspid regurgitation, and mild mitral regurgitation. RHC in 07/2022 with severely elevated RA pressure of 30, CO/CI low at 4.47/2, COP of 0.89, PAPI of 0.8,  and high PA pressures due to volume overload. Since, she has had multiple admissions for CHF. Admitted 02/09/22 due to acute on chronic HF. + cocaine. Admitted 07/24/22 due to decompensated HFrEF. Required milrinone  drip and diuresed from 116 kg >> 91 kg. Admitted 11/05/22 due to swelling and fluid retention in arms and legs along with some central chest tightness and shortness of breath. Required milrinone  for diuresis. Zio patch 12/2022 with 24 VT runs with no sustained arrhythmias. Received subcutaneous ICD 06/2023. Echocardiogram 08/07/23 showed LVEF of 30-35%, mild LVH, low normal RV function, mild MR, mild-moderate TR, mild  PR.    Pertinent Lab Values: Creat  Date Value Ref Range Status  12/14/2022 1.45 (H) 0.50 - 0.97 mg/dL Final   Creatinine, Ser  Date Value Ref Range Status  08/31/2023 1.17 (H) 0.44 - 1.00 mg/dL Final   BUN  Date Value Ref Range Status  08/31/2023 49 (H) 6 - 20 mg/dL Final  40/98/1191 15 6 - 20 mg/dL Final   Potassium  Date Value Ref Range Status  08/31/2023 3.8 3.5 - 5.1 mmol/L Final   Sodium  Date Value Ref Range Status  08/31/2023 139 135 - 145 mmol/L Final  06/13/2023 142 134 - 144 mmol/L Final   B Natriuretic Peptide  Date Value Ref Range Status  08/28/2023 733.6 (H) 0.0 - 100.0 pg/mL Final    Comment:    Performed at [facility], [address] [place], [place] [address]   Magnesium   Date Value Ref Range Status  08/28/2023 1.9 1.7 - 2.4 mg/dL Final    Comment:    Performed at [facility], [address] [place], [place] [address]   Hgb A1c MFr Bld  Date Value Ref Range Status  08/06/2023 6.7 (H) 4.8 - 5.6 % Final    Comment:    (NOTE)         Prediabetes: 5.7 - 6.4         Diabetes: >6.4         Glycemic control for adults with diabetes: <7.0    Digoxin  Level  Date Value Ref Range Status  04/20/2023 0.3 (L) 0.8 - 2.0 ng/mL Final    Comment:    Performed at [facility], [address] [place], [place] [address]   TSH  Date Value Ref Range Status  11/09/2021 5.114 (H) 0.350 - 4.500 uIU/mL Final    Comment:    Performed by a 3rd Generation assay with a functional sensitivity of <=0.01 uIU/mL. Performed at [facility], [address] [place], [place] [address]    LDH  Date Value Ref Range Status  04/15/2023 216 (H) 98 - 192 U/L Final    Comment:    Performed at [facility], [address] [place], [place] [address]    Vital Signs: Admission weight: 223 lbs Temp:  [97.8 F (36.6 C)-99.6 F (37.6 C)] 99.6 F (37.6 C) (04/30 0349) Pulse Rate:  [81-95] 90 (04/30 0349) Cardiac Rhythm:  Normal sinus rhythm (04/29 1900) Resp:  [16-19] 16 (04/30 0007) BP: (124-144)/(75-93) 128/80 (04/30 0349) SpO2:  [93 %-100 %] 93 % (04/30 0349) Weight:  [98.3 kg (216 lb 11.4 oz)-98.4 kg (217 lb)] 98.3 kg (216 lb 11.4 oz) (04/30 0325)  Intake/Output Summary (Last 24 hours) at 08/31/2023 0728 Last data filed at 08/30/2023 1559 Gross per 24 hour  Intake 480 ml  Output --  Net 480 ml    Current Heart Failure Medications:  Loop diuretic: furosemide  60 mg IV BID Beta-Blocker: carvedilol  12.5 mg BID ACEI/ARB/ARNI:  Entresto  24-26 mg BID MRA: spironolactone  25 mg daily SGLT2i: Farxiga  10 mg daily Other: digoxin  0.0625 mg daily  Prior to admission Heart Failure Medications:  Not taking medications other than digoxin  Loop diuretic: torsemide  40 mg daily Beta-Blocker: carvedilol  12.5 mg BID  ACEI/ARB/ARNI: Entresto  24-26 mg BID MRA: spironolactone  25 mg daily SGLT2i: Farxiga  10 mg daily Other: digoxin  0.0625 mg daily   Assessment: 1. Acute on chronic systolic heart failure (LVEF 30-35%) with low normal RV function, due to NICM. NYHA class II symptoms.  -Symptoms: Reports shortness of breath, fatigue, and poor appetite are mildly improved, but still present.  -Volume: Still hypervolemic with ReDs of 39%, however weight continues to trend down. Charted urine output is not high, though may be inaccurate. Weight done by CHF team today is 212 lbs, down ~11 lbs from admission. Currently on Lasix  60 mg IV BID today. Creatinine is trending down today along with BUN. Would consider doubling furosemide  dose or adding an agent to augment diuresis, such as metolazone  or acetazolamide . -Hemodynamics: BP remains elevated today. HR 70s.  -BB: Continue carvedilol  12.5 mg daily. Would not titrate while hypervolemic. -ACEI/ARB/ARNI: Consider increasing Entresto  to 49/51 mg BID.   -MRA: Continue spironolactone  25 mg daily. -SGLT2i: Continue Farxiga  10 mg daily   Plan: 1) Medication changes recommended at  this time: -Consider increasing Entresto  to 49/51 mg BID -Consider adding metolazone  2.5 mg daily today along with 40 meq of potassium x 2   2) Patient assistance: -Copays are $4, eligible to have copay waived through [facility] pharmacy   3) Education: - Patient has been educated on current HF medications and potential additions to HF medication regimen - Patient verbalizes understanding that over the next few months, these medication doses may change and more medications may be added to optimize HF regimen - Patient has been educated on basic disease state pathophysiology and goals of therapy   Medication Assistance / Insurance Benefits Check: Does the patient have prescription insurance?     Type of insurance plan:  Does the patient qualify for medication assistance through manufacturers or grants? No   Outpatient Pharmacy: Prior to admission outpatient pharmacy: [facility]   Please do not hesitate to reach  out with questions or concerns,   Bevely Brush, PharmD, CPP, BCPS Heart Failure Pharmacist  Phone - [phone]

## 2023-08-31 NOTE — Progress Notes (Signed)
 ReDS Vest / Clip - 08/31/23 0815       ReDS Vest / Clip   Station Marker D    Ruler Value 32    ReDS Value Range Moderate volume overload    ReDS Actual Value 39             Celedonio Coil, RN, BSN [facility] Heart Failure Navigator Secure Chat Only

## 2023-08-31 NOTE — Telephone Encounter (Signed)
 H&V Care Navigation CSW Progress Note  Clinical Social Worker received message from inpatient team that pt appt with [facility] HF clinic being changed to 5/5.  CSW canceled ride that had been set for 5/2 with [name] and rearranged fro 3pm pick up on 5/5- pick up from [facility] at [address] [place].  Pt informed of above  Patient is participating in a Managed Medicaid Plan:  Yes  SDOH Screenings   Food Insecurity: Food Insecurity Present (08/28/2023)  Housing: High Risk (08/28/2023)  Transportation Needs: Unmet Transportation Needs (08/29/2023)  Utilities: Not At Risk (08/28/2023)  Alcohol Screen: Low Risk  (04/15/2023)  Depression (PHQ2-9): Low Risk  (03/02/2023)  Financial Resource Strain: High Risk (08/25/2023)  Social Connections: Unknown (08/28/2023)  Tobacco Use: Medium Risk (08/28/2023)    [name], LCSW Clinical Social Worker Advanced Heart Failure Clinic Desk#: [phone] Cell#: [phone]

## 2023-08-31 NOTE — Plan of Care (Signed)
  Problem: Education: Goal: Knowledge of General Education information will improve Description: Including pain rating scale, medication(s)/side effects and non-pharmacologic comfort measures Outcome: Progressing   Problem: Health Behavior/Discharge Planning: Goal: Ability to manage health-related needs will improve Outcome: Progressing   Problem: Clinical Measurements: Goal: Ability to maintain clinical measurements within normal limits will improve Outcome: Progressing Goal: Will remain free from infection Outcome: Progressing Goal: Diagnostic test results will improve Outcome: Progressing Goal: Respiratory complications will improve Outcome: Progressing Goal: Cardiovascular complication will be avoided Outcome: Progressing   Problem: Activity: Goal: Risk for activity intolerance will decrease Outcome: Progressing   Problem: Nutrition: Goal: Adequate nutrition will be maintained Outcome: Progressing   Problem: Coping: Goal: Level of anxiety will decrease Outcome: Progressing   Problem: Elimination: Goal: Will not experience complications related to bowel motility Outcome: Progressing Goal: Will not experience complications related to urinary retention Outcome: Progressing   Problem: Pain Managment: Goal: General experience of comfort will improve and/or be controlled Outcome: Progressing   Problem: Safety: Goal: Ability to remain free from injury will improve Outcome: Progressing   Problem: Skin Integrity: Goal: Risk for impaired skin integrity will decrease Outcome: Progressing   Problem: Education: Goal: Ability to describe self-care measures that may prevent or decrease complications (Diabetes Survival Skills Education) will improve Outcome: Progressing Goal: Individualized Educational Video(s) Outcome: Progressing   Problem: Coping: Goal: Ability to adjust to condition or change in health will improve Outcome: Progressing   Problem: Fluid  Volume: Goal: Ability to maintain a balanced intake and output will improve Outcome: Progressing   Problem: Health Behavior/Discharge Planning: Goal: Ability to identify and utilize available resources and services will improve Outcome: Progressing Goal: Ability to manage health-related needs will improve Outcome: Progressing   Problem: Metabolic: Goal: Ability to maintain appropriate glucose levels will improve Outcome: Progressing   Problem: Nutritional: Goal: Maintenance of adequate nutrition will improve Outcome: Progressing Goal: Progress toward achieving an optimal weight will improve Outcome: Progressing   Problem: Skin Integrity: Goal: Risk for impaired skin integrity will decrease Outcome: Progressing   Problem: Tissue Perfusion: Goal: Adequacy of tissue perfusion will improve Outcome: Progressing   Problem: Education: Goal: Ability to demonstrate management of disease process will improve Outcome: Progressing Goal: Ability to verbalize understanding of medication therapies will improve Outcome: Progressing Goal: Individualized Educational Video(s) Outcome: Progressing   Problem: Activity: Goal: Capacity to carry out activities will improve Outcome: Progressing   Problem: Cardiac: Goal: Ability to achieve and maintain adequate cardiopulmonary perfusion will improve Outcome: Progressing   Problem: Education: Goal: Knowledge of disease or condition will improve Outcome: Progressing Goal: Knowledge of the prescribed therapeutic regimen will improve Outcome: Progressing Goal: Individualized Educational Video(s) Outcome: Progressing   Problem: Activity: Goal: Ability to tolerate increased activity will improve Outcome: Progressing Goal: Will verbalize the importance of balancing activity with adequate rest periods Outcome: Progressing   Problem: Respiratory: Goal: Ability to maintain a clear airway will improve Outcome: Progressing Goal: Levels of  oxygenation will improve Outcome: Progressing Goal: Ability to maintain adequate ventilation will improve Outcome: Progressing

## 2023-08-31 NOTE — Progress Notes (Signed)
 Progress Note   Patient: Molly Howard ZOX:[id number] DOB: 11/15/1985 DOA: 08/28/2023     2 DOS: the patient was seen and examined on 08/31/2023   Brief hospital course: 38 y.o. female with medical history significant of homelessness, chronic HFrEF status post AICD, type 2 diabetes, polysubstance abuse, obesity presenting with acute on chronic HFrEF, COPD exacerbation.  Patient reports increased work of breathing over the past 3 to 4 days.  Noted to have been admitted April 14 through 15 for syncope secondary to dehydration.  Patient states that she has torsemide  dosing.  However because she does not live near a bathroom, she is hesitant take the medication because she does not have anywhere to fully urinate.  Has not taken medication in at least 1 to 2 weeks.  Positive worsening swelling.  Also with cough and wheezing.   Clinically, patient has volume overload.  Chest x-ray showed cardiomegaly with interstitial edema.  Elevated BNP.  Patient was treated with IV Lasix .     Principal Problem:   Acute on chronic HFrEF (heart failure with reduced ejection fraction) (HCC) Active Problems:   COPD exacerbation (HCC)   Essential hypertension   Diabetes mellitus, type II (HCC)   GERD without esophagitis   Polysubstance abuse (HCC)   Obesity (BMI 30-39.9)   Uncontrolled type 2 diabetes mellitus with hyperglycemia, without long-term current use of insulin  (HCC)   Acute kidney injury superimposed on CKD (HCC)   Class 1 obesity   Hyponatremia   Assessment and Plan: * Acute on chronic HFrEF (heart failure with reduced ejection fraction) (HCC) 2D echo April 2025 with EF of 30 to 35% S/p AICD  4/29.Patient's reds vest reading 39 today.  Continue diuresis with Lasix  60 mg IV twice daily.  Continue low-dose Entresto , Farxiga  and Coreg .  Restart Aldactone .  Patient also on digoxin .  Patient's weight 217 4/30.  Renal function still improving, benefit for another day of IV Lasix .  Also a dose of  metolazone .  COPD exacerbation (HCC) Patient given 125 mg of Solu-Medrol  x 2 doses and then will be on prednisone . No longer has any bronchospasm.  Diabetes mellitus, type II (HCC) With hyperglycemia Sugar is up secondary to high-dose steroids given.  Previous hemoglobin A1c 6.7.  Restarted Jardiance.  Continue sliding scale insulin .  Essential hypertension Continue Coreg , Entresto , spironolactone   GERD without esophagitis PPI    CKD (HCC) 3a Acute kidney injury ruled out. Reviewed medical record, patient renal oxygen has been stable.  Class I obesity (BMI 30-39.9) BMI 33.99  Polysubstance abuse (HCC) Urine toxicology negative       Subjective: \ Patient feels better, no short of breath today.  Physical Exam: Vitals:   08/31/23 0325 08/31/23 0349 08/31/23 0737 08/31/23 0800  BP:  128/80 138/83   Pulse:  90 95   Resp:   16   Temp:  99.6 F (37.6 C) 99.3 F (37.4 C)   TempSrc:   Oral   SpO2:  93% 91%   Weight: 98.3 kg   96.2 kg  Height:       General exam: Appears calm and comfortable  Respiratory system: Clear to auscultation. Respiratory effort normal. Cardiovascular system: S1 & S2 heard, RRR. No JVD, murmurs, rubs, gallops or clicks. No pedal edema. Gastrointestinal system: Abdomen is nondistended, soft and nontender. No organomegaly or masses felt. Normal bowel sounds heard. Central nervous system: Alert and oriented. No focal neurological deficits. Extremities: Symmetric 5 x 5 power. Skin: No rashes, lesions or ulcers Psychiatry: Judgement  and insight appear normal. Mood & affect appropriate.    Data Reviewed:  Chest x-ray and lab results reviewed.  Family Communication: None  Disposition: Status is: Inpatient Remains inpatient appropriate because: Severity of disease, IV treatment.     Time spent: 35 minutes  Author: Donaciano Frizzle, MD 08/31/2023 3:23 PM  For on call review [email or web address].

## 2023-09-01 DIAGNOSIS — I5023 Acute on chronic systolic (congestive) heart failure: Secondary | ICD-10-CM | POA: Diagnosis not present

## 2023-09-01 DIAGNOSIS — E66811 Obesity, class 1: Secondary | ICD-10-CM

## 2023-09-01 DIAGNOSIS — J441 Chronic obstructive pulmonary disease with (acute) exacerbation: Secondary | ICD-10-CM | POA: Diagnosis not present

## 2023-09-01 LAB — GLUCOSE, CAPILLARY
Glucose-Capillary: 125 mg/dL — ABNORMAL HIGH (ref 70–99)
Glucose-Capillary: 167 mg/dL — ABNORMAL HIGH (ref 70–99)

## 2023-09-01 LAB — BASIC METABOLIC PANEL WITH GFR
Anion gap: 8 (ref 5–15)
BUN: 52 mg/dL — ABNORMAL HIGH (ref 6–20)
CO2: 24 mmol/L (ref 22–32)
Calcium: 7.7 mg/dL — ABNORMAL LOW (ref 8.9–10.3)
Chloride: 104 mmol/L (ref 98–111)
Creatinine, Ser: 1.25 mg/dL — ABNORMAL HIGH (ref 0.44–1.00)
GFR, Estimated: 56 mL/min — ABNORMAL LOW (ref >60)
Glucose, Bld: 135 mg/dL — ABNORMAL HIGH (ref 70–99)
Potassium: 3.6 mmol/L (ref 3.5–5.1)
Sodium: 136 mmol/L (ref 135–145)

## 2023-09-01 LAB — MAGNESIUM: Magnesium: 2 mg/dL (ref 1.7–2.4)

## 2023-09-01 NOTE — Plan of Care (Signed)
  Problem: Education: Goal: Knowledge of General Education information will improve Description: Including pain rating scale, medication(s)/side effects and non-pharmacologic comfort measures Outcome: Progressing   Problem: Health Behavior/Discharge Planning: Goal: Ability to manage health-related needs will improve Outcome: Progressing   Problem: Clinical Measurements: Goal: Ability to maintain clinical measurements within normal limits will improve Outcome: Progressing Goal: Will remain free from infection Outcome: Progressing Goal: Diagnostic test results will improve Outcome: Progressing Goal: Respiratory complications will improve Outcome: Progressing Goal: Cardiovascular complication will be avoided Outcome: Progressing   Problem: Activity: Goal: Risk for activity intolerance will decrease Outcome: Progressing   Problem: Nutrition: Goal: Adequate nutrition will be maintained Outcome: Progressing   Problem: Coping: Goal: Level of anxiety will decrease Outcome: Progressing   Problem: Elimination: Goal: Will not experience complications related to bowel motility Outcome: Progressing Goal: Will not experience complications related to urinary retention Outcome: Progressing   Problem: Pain Managment: Goal: General experience of comfort will improve and/or be controlled Outcome: Progressing   Problem: Safety: Goal: Ability to remain free from injury will improve Outcome: Progressing   Problem: Skin Integrity: Goal: Risk for impaired skin integrity will decrease Outcome: Progressing   Problem: Education: Goal: Ability to describe self-care measures that may prevent or decrease complications (Diabetes Survival Skills Education) will improve Outcome: Progressing Goal: Individualized Educational Video(s) Outcome: Progressing   Problem: Coping: Goal: Ability to adjust to condition or change in health will improve Outcome: Progressing   Problem: Fluid  Volume: Goal: Ability to maintain a balanced intake and output will improve Outcome: Progressing   Problem: Health Behavior/Discharge Planning: Goal: Ability to identify and utilize available resources and services will improve Outcome: Progressing Goal: Ability to manage health-related needs will improve Outcome: Progressing   Problem: Metabolic: Goal: Ability to maintain appropriate glucose levels will improve Outcome: Progressing   Problem: Nutritional: Goal: Maintenance of adequate nutrition will improve Outcome: Progressing Goal: Progress toward achieving an optimal weight will improve Outcome: Progressing   Problem: Skin Integrity: Goal: Risk for impaired skin integrity will decrease Outcome: Progressing   Problem: Tissue Perfusion: Goal: Adequacy of tissue perfusion will improve Outcome: Progressing   Problem: Education: Goal: Ability to demonstrate management of disease process will improve Outcome: Progressing Goal: Ability to verbalize understanding of medication therapies will improve Outcome: Progressing Goal: Individualized Educational Video(s) Outcome: Progressing   Problem: Activity: Goal: Capacity to carry out activities will improve Outcome: Progressing   Problem: Cardiac: Goal: Ability to achieve and maintain adequate cardiopulmonary perfusion will improve Outcome: Progressing   Problem: Education: Goal: Knowledge of disease or condition will improve Outcome: Progressing Goal: Knowledge of the prescribed therapeutic regimen will improve Outcome: Progressing Goal: Individualized Educational Video(s) Outcome: Progressing   Problem: Activity: Goal: Ability to tolerate increased activity will improve Outcome: Progressing Goal: Will verbalize the importance of balancing activity with adequate rest periods Outcome: Progressing   Problem: Respiratory: Goal: Ability to maintain a clear airway will improve Outcome: Progressing Goal: Levels of  oxygenation will improve Outcome: Progressing Goal: Ability to maintain adequate ventilation will improve Outcome: Progressing
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
## 2023-09-01 NOTE — Progress Notes (Signed)
 Heart Failure Stewardship Pharmacy Note  PCP: Rockney Cid, DO PCP-Cardiologist: Constancia Delton, MD   HPI: Molly Howard is a 38 y.o. female with combined systolic and diastolic CHF, stage II chronic kidney disease, type 2 diabetes mellitus, hypertension, and nonischemic cardiomyopathy, GERD as well as polysubstance abuse who presented with worsening shortness of breath and swelling. Multiple recent admissions noted. Recently seen by CHF clinic where she was noted to be hypervolemic and not taking medications. She reports nonadherence to CHF medications because she is homeless and does not have a bathroom close by the park she stays. On admission, BNP 733.6, HS-troponin was 23, and UDS was negative. Chest x-ray noted cardiomegaly with mild interstitial edema. CT head noted no acute issues, but remote left frontal and cerebella infarcts.   Pertinent Cardiac History: Noted to have CHF in 2021 where echo showed LVEF of 35-40%. Cardiac MRI in 05/2020 showed an EF of 42%, mild LV dilatation, RVEF 50%, with no delayed enhancement. Echo in 07/2022 with LVEF of 25 to 30%, mildly decreased RV systolic function, moderate to severe tricuspid regurgitation, and mild mitral regurgitation. RHC in 07/2022 with severely elevated RA pressure of 30, CO/CI low at 4.47/2, COP of 0.89, PAPI of 0.8,  and high PA pressures due to volume overload. Since, she has had multiple admissions for CHF. Admitted 02/09/22 due to acute on chronic HF. + cocaine. Admitted 07/24/22 due to decompensated HFrEF. Required milrinone  drip and diuresed from 116 kg >> 91 kg. Admitted 11/05/22 due to swelling and fluid retention in arms and legs along with some central chest tightness and shortness of breath. Required milrinone  for diuresis. Zio patch 12/2022 with 24 VT runs with no sustained arrhythmias. Received subcutaneous ICD 06/2023. Echocardiogram 08/07/23 showed LVEF of 30-35%, mild LVH, low normal RV function, mild MR, mild-moderate TR, mild  PR.    Pertinent Lab Values: Creat  Date Value Ref Range Status  12/14/2022 1.45 (H) 0.50 - 0.97 mg/dL Final   Creatinine, Ser  Date Value Ref Range Status  08/31/2023 1.17 (H) 0.44 - 1.00 mg/dL Final   BUN  Date Value Ref Range Status  08/31/2023 49 (H) 6 - 20 mg/dL Final  40/34/7425 15 6 - 20 mg/dL Final   Potassium  Date Value Ref Range Status  08/31/2023 3.8 3.5 - 5.1 mmol/L Final   Sodium  Date Value Ref Range Status  08/31/2023 139 135 - 145 mmol/L Final  06/13/2023 142 134 - 144 mmol/L Final   B Natriuretic Peptide  Date Value Ref Range Status  08/28/2023 733.6 (H) 0.0 - 100.0 pg/mL Final    Comment:    Performed at [facility], [address] [place], [place] [address]   Magnesium   Date Value Ref Range Status  08/28/2023 1.9 1.7 - 2.4 mg/dL Final    Comment:    Performed at [facility], [address] [place], [place] [address]   Hgb A1c MFr Bld  Date Value Ref Range Status  08/06/2023 6.7 (H) 4.8 - 5.6 % Final    Comment:    (NOTE)         Prediabetes: 5.7 - 6.4         Diabetes: >6.4         Glycemic control for adults with diabetes: <7.0    Digoxin  Level  Date Value Ref Range Status  04/20/2023 0.3 (L) 0.8 - 2.0 ng/mL Final    Comment:    Performed at [facility], [address] [place], [place] [address]   TSH  Date Value Ref Range Status  11/09/2021 5.114 (H) 0.350 - 4.500 uIU/mL Final    Comment:    Performed by a 3rd Generation assay with a functional sensitivity of <=0.01 uIU/mL. Performed at [facility], [address] [place], [place] [address]    LDH  Date Value Ref Range Status  04/15/2023 216 (H) 98 - 192 U/L Final    Comment:    Performed at [facility], [address] [place], [place] [address]    Vital Signs: Admission weight: 223 lbs Temp:  [97.6 F (36.4 C)-99.3 F (37.4 C)] 98.1 F (36.7 C) (05/01 0408) Pulse Rate:  [79-95] 79 (05/01 0408) Cardiac Rhythm:  Normal sinus rhythm (04/30 1907) Resp:  [16-20] 20 (05/01 0408) BP: (123-146)/(83-93) 135/93 (05/01 0408) SpO2:  [91 %-98 %] 98 % (05/01 0408) Weight:  [95.8 kg (211 lb 4.8 oz)-96.2 kg (212 lb)] 95.8 kg (211 lb 4.8 oz) (05/01 0525)  Intake/Output Summary (Last 24 hours) at 09/01/2023 0717 Last data filed at 08/31/2023 1928 Gross per 24 hour  Intake 480 ml  Output --  Net 480 ml    Current Heart Failure Medications:  Loop diuretic: furosemide  60 mg IV BID Beta-Blocker: carvedilol  12.5 mg BID ACEI/ARB/ARNI:  Entresto  24-26 mg BID MRA: spironolactone  25 mg daily SGLT2i: Farxiga  10 mg daily Other: digoxin  0.0625 mg daily  Prior to admission Heart Failure Medications:  Not taking medications other than digoxin  Loop diuretic: torsemide  40 mg daily Beta-Blocker: carvedilol  12.5 mg BID  ACEI/ARB/ARNI: Entresto  24-26 mg BID MRA: spironolactone  25 mg daily SGLT2i: Farxiga  10 mg daily Other: digoxin  0.0625 mg daily   Assessment: 1. Acute on chronic systolic heart failure (LVEF 30-35%) with low normal RV function, due to NICM. NYHA class II symptoms.  -Symptoms: Reports shortness of breath, and fatigue remain mildly improved, but still present.  -Volume: Charted urine output is not high, though likely inaccurate. Weight is down 1 lbs today, ~12 since admission. Currently on Lasix  60 mg IV BID today. Creatinine is trending up today along with BUN. Likely close to euvolemic. -Hemodynamics: BP remains elevated today. HR 70s.  -BB: Continue carvedilol  12.5 mg daily. Would not titrate while hypervolemic. -ACEI/ARB/ARNI: Consider increasing Entresto  to 49/51 mg BID.   -MRA: Continue spironolactone  25 mg daily. -SGLT2i: Continue Farxiga  10 mg daily   Plan: 1) Medication changes recommended at this time: -Consider increasing Entresto  to 49/51 mg BID    2) Patient assistance: -Copays are $4, eligible to have copay waived through [facility] pharmacy   3) Education: - Patient has been educated on  current HF medications and potential additions to HF medication regimen - Patient verbalizes understanding that over the next few months, these medication doses may change and more medications may be added to optimize HF regimen - Patient has been educated on basic disease state pathophysiology and goals of therapy   Medication Assistance / Insurance Benefits Check: Does the patient have prescription insurance?     Type of insurance plan:  Does the patient qualify for medication assistance through manufacturers or grants? No   Outpatient Pharmacy: Prior to admission outpatient pharmacy: [facility]   Please do not hesitate to reach out with questions or concerns,   Bevely Brush, PharmD, CPP, BCPS Heart Failure Pharmacist  Phone - [phone]

## 2023-09-01 NOTE — Progress Notes (Signed)
 I have reviewed and concur with this student's documentation.   Saagar Tortorella, RN 09/01/2023 12:39 PM

## 2023-09-01 NOTE — TOC Transition Note (Signed)
 Transition of Care [facility]) - Discharge Note   Patient Details  Name: Molly Howard MRN: [id number] Date of Birth: Sep 02, 1985  Transition [facility]) CM/SW Contact:  Altheia Shafran C Cambell Rickenbach, RN Phone Number: 09/01/2023, 10:54 AM   Clinical Narrative:    Spoke with Alton Jewel, Executive Director from [employer] regarding shelter admissions criteria. He stated he had received multiple calls from [facility] regarding the same issue. He stated a medical condition is not a reason to deny entrance. There is no availability at the shelter. He provided information about this patient not accepting a plan coordinated by [facility] to go to [facility] 4/15. Patient was offered a bus ticket and had identified someone to stay with. She would not accept the help offered.   11:04am Spoke with patient regarding discharge today. She stated she has been on the phone with family to see who she maybe able to stay with. She is unsure if they will be able to pick her. She was advised a taxi is available.          Patient Goals and CMS Choice            Discharge Placement                       Discharge Plan and Services Additional resources added to the After Visit Summary for                                       Social Drivers of Health (SDOH) Interventions SDOH Screenings   Food Insecurity: Food Insecurity Present (08/28/2023)  Housing: High Risk (08/28/2023)  Transportation Needs: Unmet Transportation Needs (08/29/2023)  Utilities: Not At Risk (08/28/2023)  Alcohol Screen: Low Risk  (04/15/2023)  Depression (PHQ2-9): Low Risk  (03/02/2023)  Financial Resource Strain: High Risk (08/25/2023)  Social Connections: Unknown (08/28/2023)  Tobacco Use: Medium Risk (08/28/2023)     Readmission Risk Interventions    11/08/2022    3:23 PM 07/25/2022   10:35 AM 02/15/2022    2:34 PM  Readmission Risk Prevention Plan  Transportation Screening Complete Complete Complete  PCP or  Specialist Appt within 3-5 Days  Complete Complete  Social Work Consult for Recovery Care Planning/Counseling  Complete Complete  Palliative Care Screening  Not Applicable Not Applicable  Medication Review Oceanographer) Complete Complete Complete  PCP or Specialist appointment within 3-5 days of discharge Complete    SW Recovery Care/Counseling Consult Complete    Palliative Care Screening Not Applicable    Skilled Nursing Facility Not Applicable

## 2023-09-02 ENCOUNTER — Telehealth: Payer: Self-pay | Admitting: Family

## 2023-09-02 ENCOUNTER — Encounter: Admitting: Family

## 2023-09-02 NOTE — Telephone Encounter (Signed)
 Called to confirm/remind patient of their appointment at the Advanced Heart Failure Clinic on 09/05/23.   Appointment:   [x] Confirmed  [] Left mess   [] No answer/No voice mail  [] VM Full/unable to leave message  [] Phone not in service  Patient reminded to bring all medications and/or complete list.  Confirmed patient has transportation. Gave directions, instructed to utilize valet parking.
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
## 2023-09-05 ENCOUNTER — Telehealth (HOSPITAL_COMMUNITY): Payer: Self-pay | Admitting: Licensed Clinical Social Worker

## 2023-09-05 ENCOUNTER — Encounter: Payer: Self-pay | Admitting: Family

## 2023-09-05 ENCOUNTER — Ambulatory Visit (HOSPITAL_BASED_OUTPATIENT_CLINIC_OR_DEPARTMENT_OTHER): Admitting: Family

## 2023-09-05 ENCOUNTER — Other Ambulatory Visit
Admission: RE | Admit: 2023-09-05 | Discharge: 2023-09-05 | Disposition: A | Discharge status: Home / Self Care | Source: Ambulatory Visit | Attending: Cardiology | Admitting: Cardiology

## 2023-09-05 ENCOUNTER — Other Ambulatory Visit: Payer: Self-pay

## 2023-09-05 VITALS — BP 155/100 | HR 94 | Wt 212.2 lb

## 2023-09-05 DIAGNOSIS — I472 Ventricular tachycardia, unspecified: Secondary | ICD-10-CM | POA: Insufficient documentation

## 2023-09-05 DIAGNOSIS — D509 Iron deficiency anemia, unspecified: Secondary | ICD-10-CM | POA: Diagnosis not present

## 2023-09-05 DIAGNOSIS — R008 Other abnormalities of heart beat: Secondary | ICD-10-CM | POA: Diagnosis not present

## 2023-09-05 DIAGNOSIS — Z7985 Long-term (current) use of injectable non-insulin antidiabetic drugs: Secondary | ICD-10-CM | POA: Diagnosis not present

## 2023-09-05 DIAGNOSIS — E1129 Type 2 diabetes mellitus with other diabetic kidney complication: Secondary | ICD-10-CM | POA: Diagnosis not present

## 2023-09-05 DIAGNOSIS — E1122 Type 2 diabetes mellitus with diabetic chronic kidney disease: Secondary | ICD-10-CM | POA: Diagnosis not present

## 2023-09-05 DIAGNOSIS — Z7984 Long term (current) use of oral hypoglycemic drugs: Secondary | ICD-10-CM | POA: Diagnosis not present

## 2023-09-05 DIAGNOSIS — I10 Essential (primary) hypertension: Secondary | ICD-10-CM

## 2023-09-05 DIAGNOSIS — I5020 Unspecified systolic (congestive) heart failure: Secondary | ICD-10-CM

## 2023-09-05 DIAGNOSIS — Z79899 Other long term (current) drug therapy: Secondary | ICD-10-CM | POA: Diagnosis not present

## 2023-09-05 DIAGNOSIS — I5082 Biventricular heart failure: Secondary | ICD-10-CM | POA: Insufficient documentation

## 2023-09-05 DIAGNOSIS — Z5902 Unsheltered homelessness: Secondary | ICD-10-CM | POA: Diagnosis not present

## 2023-09-05 DIAGNOSIS — Z5900 Homelessness unspecified: Secondary | ICD-10-CM

## 2023-09-05 DIAGNOSIS — I428 Other cardiomyopathies: Secondary | ICD-10-CM | POA: Insufficient documentation

## 2023-09-05 DIAGNOSIS — Z95810 Presence of automatic (implantable) cardiac defibrillator: Secondary | ICD-10-CM | POA: Diagnosis not present

## 2023-09-05 DIAGNOSIS — D631 Anemia in chronic kidney disease: Secondary | ICD-10-CM | POA: Diagnosis not present

## 2023-09-05 DIAGNOSIS — I5022 Chronic systolic (congestive) heart failure: Secondary | ICD-10-CM | POA: Insufficient documentation

## 2023-09-05 DIAGNOSIS — I130 Hypertensive heart and chronic kidney disease with heart failure and stage 1 through stage 4 chronic kidney disease, or unspecified chronic kidney disease: Secondary | ICD-10-CM | POA: Insufficient documentation

## 2023-09-05 DIAGNOSIS — Z91148 Patient's other noncompliance with medication regimen for other reason: Secondary | ICD-10-CM | POA: Insufficient documentation

## 2023-09-05 DIAGNOSIS — F1910 Other psychoactive substance abuse, uncomplicated: Secondary | ICD-10-CM

## 2023-09-05 DIAGNOSIS — I3139 Other pericardial effusion (noninflammatory): Secondary | ICD-10-CM | POA: Diagnosis not present

## 2023-09-05 DIAGNOSIS — N182 Chronic kidney disease, stage 2 (mild): Secondary | ICD-10-CM | POA: Insufficient documentation

## 2023-09-05 LAB — BASIC METABOLIC PANEL WITH GFR
Anion gap: 11 (ref 5–15)
BUN: 46 mg/dL — ABNORMAL HIGH (ref 6–20)
CO2: 25 mmol/L (ref 22–32)
Calcium: 7.4 mg/dL — ABNORMAL LOW (ref 8.9–10.3)
Chloride: 100 mmol/L (ref 98–111)
Creatinine, Ser: 1.81 mg/dL — ABNORMAL HIGH (ref 0.44–1.00)
GFR, Estimated: 36 mL/min — ABNORMAL LOW (ref >60)
Glucose, Bld: 135 mg/dL — ABNORMAL HIGH (ref 70–99)
Potassium: 4.6 mmol/L (ref 3.5–5.1)
Sodium: 136 mmol/L (ref 135–145)

## 2023-09-05 MED ORDER — AMLODIPINE BESYLATE 10 MG PO TABS
10.0000 mg | ORAL_TABLET | Freq: Every day | ORAL | 3 refills | Status: DC
  Filled 2023-09-05: qty 90, 90d supply, fill #0

## 2023-09-05 NOTE — Telephone Encounter (Signed)
 H&V Care Navigation CSW Progress Note  Clinical Social Worker called pt and reminded of appt this afternoon with clinic and of transport to pick up from [facility] at 3pm- pt acknowledged and has no barriers to attending.  CSW also confirmed ride with [name]  Patient is participating in a Managed Medicaid Plan:  Yes  SDOH Screenings   Food Insecurity: Food Insecurity Present (08/28/2023)  Housing: High Risk (08/28/2023)  Transportation Needs: Unmet Transportation Needs (08/29/2023)  Utilities: Not At Risk (08/28/2023)  Alcohol Screen: Low Risk  (04/15/2023)  Depression (PHQ2-9): Low Risk  (03/02/2023)  Financial Resource Strain: High Risk (08/25/2023)  Social Connections: Unknown (08/28/2023)  Tobacco Use: Medium Risk (08/28/2023)    [name], LCSW Clinical Social Worker Advanced Heart Failure Clinic Desk#: [phone] Cell#: [phone]

## 2023-09-05 NOTE — Patient Instructions (Addendum)
 Medication Changes:  START AMLODIPINE 10 MG ONCE DAILY   Lab Work:  Go over to the [facility]. Go pass the gift shop and have your blood work completed.  We will only call you if the results are abnormal or if the provider would like to make medication changes.   Follow-Up On: MONDAY, MAY 19TH AT 1:30 PM  At the [facility], you and your health needs are our priority. We have a designated team specialized in the treatment of Heart Failure. This Care Team includes your primary Heart Failure Specialized Cardiologist (physician), Advanced Practice Providers (APPs- Physician Assistants and Nurse Practitioners), and Pharmacist who all work together to provide you with the care you need, when you need it.   You may see any of the following providers on your designated Care Team at your next follow up:  Dr. Jules Oar Dr. Peder Bourdon Dr. Alwin Baars Dr. Judyth Nunnery Shawnee Dellen, FNP Bevely Brush, RPH-CPP  Please be sure to bring in all your medications bottles to every appointment.   Need to Contact Us :  If you have any questions or concerns before your next appointment please send us  a message through [place] or call our office at [phone].    TO LEAVE A MESSAGE FOR THE NURSE SELECT OPTION 2, PLEASE LEAVE A MESSAGE INCLUDING: YOUR NAME DATE OF BIRTH CALL BACK NUMBER REASON FOR CALL**this is important as we prioritize the call backs  YOU WILL RECEIVE A CALL BACK THE SAME DAY AS LONG AS YOU CALL BEFORE 4:00 PM

## 2023-09-05 NOTE — Progress Notes (Signed)
 [facility] - HEART FAILURE CLINIC - PHARMACIST COUNSELING NOTE  Guideline-Directed Medical Therapy/Evidence Based Medicine  ACE/ARB/ARNI: Sacubitril -valsartan  97-103 mg twice daily Beta Blocker: Carvedilol  12.5 mg twice daily Aldosterone Antagonist: Spironolactone  25 mg daily Diuretic: Torsemide  40 mg daily SGLT2i: Dapagliflozin  10 mg daily  Adherence Assessment  Do you ever forget to take your medication? [] Yes [x] No  Do you ever skip doses due to side effects? [] Yes [x] No  Do you have trouble affording your medicines? [x] Yes [] No  Are you ever unable to pick up your medication due to transportation difficulties? [] Yes [x] No  Do you ever stop taking your medications because you don't believe they are helping? [] Yes [x] No  Do you check your weight daily? [] Yes [x] No   Adherence strategy: Hard to take medications due to housing and bathroom situation   Barriers to obtaining medications:  -- They get their medications filled at [facility] pharmacy (eligible to have co-pays waiver) -- They are currently homeless and are in touch with social worker Aubry Blase, [place])  -- They do not forget to take their medications, but chose not to due to lack of access to a bathroom   Vital signs: HR 94, BP 155/100, weight (pounds) 212.1 lbs Renal Function: GFR 56 09/01/23 Echo 10/11/20: EF 30-35% along with moderate TR.  Echo 03/20/21: EF 25-30% along with severely elevated PA pressure of 70.8 mmHg, mild LAE, mild MR and mild/moderate TR Echo 06/30/21: EF  30-35% along with mild MR. Echo 09/18/21: EF  25-30%.   Echo 07/25/22: EF 25-30% with moderately elevated PA pressure of 50.7 mmHg, mild LAE, mild MR, small pericardial effusion and moderate/ severe TR     Latest Ref Rng & Units 09/01/2023    5:24 AM 08/31/2023    5:35 AM 08/30/2023    5:25 AM  BMP  Glucose 70 - 99 mg/dL 161  90  096   BUN 6 - 20 mg/dL 52  49  56   Creatinine 0.44 - 1.00 mg/dL 0.45  4.09  8.11   Sodium 135  - 145 mmol/L 136  139  136   Potassium 3.5 - 5.1 mmol/L 3.6  3.8  4.0   Chloride 98 - 111 mmol/L 104  105  102   CO2 22 - 32 mmol/L 24  25  23    Calcium  8.9 - 10.3 mg/dL 7.7  8.1  8.5    Past Medical History:  Diagnosis Date   Acid reflux    Chronic HFrEF (heart failure with reduced ejection fraction) (HCC)    a. 10/2019 Echo: EF 35-40%, GrII DD; b. 05/2020 Echo: EF 20-25%, glob HK; c. 10/2020 Echo: EF 30-35%, glob HK. GrII DD, Mildly red RV fxn. Mod TR; d.  05/2021 cMRI: EF 42%, no LGE. Nl RV size/fxn.   CKD (chronic kidney disease), stage II    Diabetes mellitus (HCC)    H/O medication noncompliance    Hypertension    Microcytic anemia    NICM (nonischemic cardiomyopathy) (HCC)    a. 10/2019 Echo: EF 35-40%; b. 10/2019 MV: No ischemia. Small apical defect-->breast attenuation; c. 05/2020 Echo: EF 20-25%; d. 10/2020 Echo: EF 30-35%, glob HK. GrII DD, Mildly red RV fxn. Mod TR; e. 05/2021 cMRI: EF 42%, no LGE. Nl RV size/fxn.   Obesity    Polysubstance abuse (HCC)    ASSESSMENT AS is a 38 year old female who presents to the HF clinic for a follow-up visit one week post IV lasix . They have a past medical history  significant for DM, HTN, previous drug use, NSVT, PSVT, mod/severe TR, CKD, anemia and chronic biventricular heart failure. Last visit they had to clinic 08/26/2023, they were 12 lbs up from 2 months ago. They were sent to receive 80 mg of IV lasix  and 40 mEq of potassium. Reported they had not been taking any of her medications that last week due to limited access to a bathroom. Resumed all medications: Coreg , Farxiga , spironolactone , Entresto , torsemide . They are not a candidate for advanced therapies due to repeated positive drug screens. Patient recently was hospitalized 08/28/2023 due to increased SOB with significant fluid overload. They were treated with IV lasix  inpatient.   Medications review with patient today. They are on all components of GDMT. Their blood pressure is uncontrolled at  this time. First they reported not taking their medications for over a week, but then said that this was only their fluid pills that they didn't take for over a week (since hospital discharge). The biggest hurdle they face is medication access and taking medications while having trouble with regular access to a bathroom. They've been in touch with social work and they were unable to help them with housing. Patient reported they do have access to transportation and food stamps. No shelters are currently available per patient.   When discussing their symptoms, they report they are fluid overloaded similar to how they were last time before going to the hospital. Patient reports shortness of breath, cough with fluid (clear), and chest pain at times. Additionally, the area their pacemaker is hurts.   Recent ED Visit (past 6 months):  -- Date - 08/28/2023, [facility] - Fluid overload, acute on chronic HFrEF  PLAN  Prevention LDL 145 (05/27/22) -- atorvastatin  20 mg daily (last fill 02/10/2023 [facility]) A1c 6.7 (08/06/23) -- Dapagliflozin  10 mg daily -- Semaglutide   BP -- Digoxin  0.0625 mg daily   Recommendations -- Consider treatment with IV lasix  to help with fluid status. Encouraged them take their fluid pills regularly to stay out of the hospital, this is difficult since they don't have access to a bathroom -- Continue current regimen as directed by NP, if confirm they took their medications today except for the fluid pills consider adding amlodipine 5 mg daily  -- Consider re-starting statin in the future since they haven't had a recent fill since 02/10/2023  Thank you for allowing pharmacy to participate in this patient's care.   Time spent: 15 minutes  Craven Do, PharmD Pharmacy Resident  09/05/2023 10:51 AM

## 2023-09-06 ENCOUNTER — Other Ambulatory Visit: Payer: Self-pay | Admitting: Family

## 2023-09-06 ENCOUNTER — Telehealth: Payer: Self-pay | Admitting: Family

## 2023-09-06 DIAGNOSIS — I5020 Unspecified systolic (congestive) heart failure: Secondary | ICD-10-CM

## 2023-09-06 NOTE — Telephone Encounter (Signed)
 Patient called to say that she's been having more SOB and cough since she was seen in clinic yesterday. Has not taken torsemide  or spironolactone  since hospital discharge 5 days ago as she is still living at [facility] and is too far away from the bathroom.   She says that she can call someone who gives her rides to her appointments about bringing her in tomorrow to receive IV lasix . Staff will call same day surgery to get a time for IV lasix  tomorrow as it's too late in the day today for them to accommodate this.   Encouraged her to take even just 20mg  torsemide  in the mornings and stay closer to the bathroom if possible. Explained that we wanted to keep her out of the hospital if possible but that it was going to be extremely difficult to do so if she can't take her diuretic/ MRA. She says that she will try to do this but isn't sure she can 100% commit to this.   She was appreciative of the assistance.

## 2023-09-06 NOTE — Progress Notes (Signed)
 IV lasix  orders placed

## 2023-09-07 ENCOUNTER — Ambulatory Visit
Admission: RE | Admit: 2023-09-07 | Discharge: 2023-09-07 | Disposition: A | Discharge status: Home / Self Care | Source: Ambulatory Visit | Attending: Family | Admitting: Family

## 2023-09-07 ENCOUNTER — Telehealth (HOSPITAL_COMMUNITY): Payer: Self-pay | Admitting: Licensed Clinical Social Worker

## 2023-09-07 DIAGNOSIS — I5020 Unspecified systolic (congestive) heart failure: Secondary | ICD-10-CM | POA: Diagnosis present

## 2023-09-07 LAB — BASIC METABOLIC PANEL WITH GFR
Anion gap: 6 (ref 5–15)
BUN: 39 mg/dL — ABNORMAL HIGH (ref 6–20)
CO2: 25 mmol/L (ref 22–32)
Calcium: 7.8 mg/dL — ABNORMAL LOW (ref 8.9–10.3)
Chloride: 106 mmol/L (ref 98–111)
Creatinine, Ser: 1.43 mg/dL — ABNORMAL HIGH (ref 0.44–1.00)
GFR, Estimated: 48 mL/min — ABNORMAL LOW (ref >60)
Glucose, Bld: 137 mg/dL — ABNORMAL HIGH (ref 70–99)
Potassium: 4.5 mmol/L (ref 3.5–5.1)
Sodium: 137 mmol/L (ref 135–145)

## 2023-09-07 LAB — BRAIN NATRIURETIC PEPTIDE: B Natriuretic Peptide: 448.6 pg/mL — ABNORMAL HIGH (ref 0.0–100.0)

## 2023-09-07 MED ORDER — FUROSEMIDE 10 MG/ML IJ SOLN
INTRAMUSCULAR | Status: AC
  Filled 2023-09-07: qty 4

## 2023-09-07 MED ORDER — POTASSIUM CHLORIDE CRYS ER 20 MEQ PO TBCR
EXTENDED_RELEASE_TABLET | ORAL | Status: AC
  Filled 2023-09-07: qty 2

## 2023-09-07 MED ORDER — FUROSEMIDE 10 MG/ML IJ SOLN
80.0000 mg | Freq: Once | INTRAMUSCULAR | Status: AC
  Administered 2023-09-07: 80 mg via INTRAVENOUS

## 2023-09-07 MED ORDER — POTASSIUM CHLORIDE CRYS ER 20 MEQ PO TBCR
40.0000 meq | EXTENDED_RELEASE_TABLET | Freq: Once | ORAL | Status: AC
  Administered 2023-09-07: 40 meq via ORAL

## 2023-09-07 NOTE — Telephone Encounter (Signed)
 H&V Care Navigation CSW Progress Note  Clinical Social Worker consulted to assist with transportation to IV lasix  today.  CSW able to get pt taxi ride- pick up from [employer] at 10am and plan to be dropped off at [place] following appt- pt provided with taxi company number to call for return ride.   SDOH Screenings   Food Insecurity: Food Insecurity Present (08/28/2023)  Housing: High Risk (08/28/2023)  Transportation Needs: Unmet Transportation Needs (08/29/2023)  Utilities: Not At Risk (08/28/2023)  Alcohol Screen: Low Risk  (04/15/2023)  Depression (PHQ2-9): Low Risk  (03/02/2023)  Financial Resource Strain: High Risk (08/25/2023)  Social Connections: Unknown (08/28/2023)  Tobacco Use: Medium Risk (09/05/2023)    [name], LCSW Clinical Social Worker [facility] Desk#: [phone] Cell#: [phone]

## 2023-09-15 ENCOUNTER — Other Ambulatory Visit: Payer: Self-pay

## 2023-09-16 ENCOUNTER — Encounter: Payer: Medicaid Other | Admitting: Family

## 2023-09-16 ENCOUNTER — Telehealth: Payer: Self-pay | Admitting: Family

## 2023-09-16 NOTE — Telephone Encounter (Signed)
 Called to confirm/remind patient of their appointment at the [facility] on 09/19/23.   Appointment:   [] Confirmed  [x] Left mess   [] No answer/No voice mail  [] VM Full/unable to leave message  [] Phone not in service  Patient reminded to bring all medications and/or complete list.  Confirmed patient has transportation. Gave directions, instructed to utilize valet parking.

## 2023-09-19 ENCOUNTER — Telehealth: Payer: Self-pay | Admitting: Family

## 2023-09-19 ENCOUNTER — Encounter: Admitting: Family

## 2023-09-19 NOTE — Telephone Encounter (Signed)
 Patient did not show for her [facility] appointment on 09/19/23.

## 2023-09-21 ENCOUNTER — Ambulatory Visit: Payer: Self-pay

## 2023-09-21 NOTE — Telephone Encounter (Signed)
  Chief Complaint: palpitations Symptoms: palpitations and irregular heartbeat per pt, SOB but not present currently  Frequency: ongoing issue Pertinent Negatives: Patient denies chest pain  Disposition: [] ED /[] Urgent Care (no appt availability in office) / [] Appointment(In office/virtual)/ []  [facility]/ [] Home Care/ [] Refused Recommended Disposition /[] [facility]/ []  Follow-up with PCP Additional Notes: pt states she has pacemaker but having irregular HR and palpitations at times. Asked pt if she has experienced any alarms with pacemaker but she said she doesn't have computer that gives that info. I advised pt to call [facility] this morning to see if she can be seen today since pt is followed by them. Pt still wanted to schedule appt with PCP for DM FU. Appt scheduled for 10/03/23 with PCP.   Copied from CRM [id number]. Topic: Clinical - Red Word Triage >> Sep 21, 2023 10:29 AM Kevelyn M wrote: I was attempting to schedule an appointment and the patient mentioned that a condition that she has already been seen for is getting worse. Patient has a pacemaker and she says she's been experience irregular heart beats/fast heart beats last week. Patient was diagnosed with kidney disease. Reason for Disposition  New-onset of palpitations (skipped beats or fluttering in chest)  Answer Assessment - Initial Assessment Questions 1. DESCRIPTION: "Please describe the concern you have with your pacemaker or defibrillator" (e.g.,  delivered a shock, palpitations, beeping heard)     Irregular heartbeat and palpitations  2. ONSET: "When did this occur?" (e.g., minutes, hours or days)     Ongoing  3. DURATION: "How long did it last" (e.g., seconds, minutes, hours)      Ongoing sx  4. RECURRENT SYMPTOM: "Have you ever had this before?" If YES,  "When was the last time?" and "What happened that time?"      All the time  6. OTHER SYMPTOMS: "Do you have any other symptoms?" (e.g., dizziness,  chest pain, fever, sweating, difficulty breathing)     SOB but not present right now  Protocols used: ICD and Pacemaker Symptoms and Questions-A-AH

## 2023-09-22 ENCOUNTER — Telehealth: Payer: Self-pay | Admitting: Family
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
## 2023-09-22 NOTE — Telephone Encounter (Signed)
 Called to confirm/remind patient of their appointment at the Advanced Heart Failure Clinic on 09/23/23.   Appointment:   [] Confirmed  [x] Left mess   [] No answer/No voice mail  [] VM Full/unable to leave message  [] Phone not in service  Patient reminded to bring all medications and/or complete list.  Confirmed patient has transportation. Gave directions, instructed to utilize valet parking.

## 2023-09-23 ENCOUNTER — Encounter: Admitting: Family

## 2023-09-26 NOTE — Progress Notes (Deleted)
 Electrophysiology Clinic Note    Date:  09/26/2023  Patient ID:  Molly Howard, Molly Howard 12/04/1985, MRN [id number] PCP:  Rockney Cid, DO  Cardiologist:  Constancia Delton, MD Electrophysiologist: Boyce Byes, MD  ***refresh  Discussed the use of AI scribe software for clinical note transcription with the patient, who gave verbal consent to proceed.   Patient Profile    Chief Complaint: routine ICD follow-up  History of Present Illness: Molly Howard is a 38 y.o. female with PMH notable for NICM, HFrEF, SubQ ICD, HTN, T2DM, COPD, IDA, T2DM, cocaine use ; seen today for Boyce Byes, MD for routine electrophysiology followup.   She is s/p subcutaneous icd implant 06/2023.   Since last being seen in our clinic the patient reports doing ***.  she denies chest pain, palpitations, dyspnea, PND, orthopnea, nausea, vomiting, dizziness, syncope, edema, weight gain, or early satiety.      Arrhythmia/Device History Bos sci Sub-q ICD, imp 06/2023; dx HFrEF    [facility] Lab ONLY, Do NOT send to [facility]   ROS:  Please see the history of present illness. All other systems are reviewed and otherwise negative.    Physical Exam    VS:  There were no vitals taken for this visit. BMI: There is no height or weight on file to calculate BMI.  Wt Readings from Last 3 Encounters:  09/05/23 212 lb 3.2 oz (96.3 kg)  09/01/23 211 lb 4.8 oz (95.8 kg)  08/26/23 223 lb (101.2 kg)     GEN- The patient is well appearing, alert and oriented x 3 today.   Lungs- Clear to ausculation bilaterally, normal work of breathing.  Heart- {Blank single:19197::"Regular","Irregularly irregular"} rate and rhythm, no murmurs, rubs or gallops Extremities- {EDEMA LEVEL:28147::"No"} peripheral edema, warm, dry Skin-  *** device pocket well-healed, no tethering   Device interrogation done today and reviewed by myself:  Battery *** Lead thresholds, impedence, sensing stable *** *** episodes *** changes  made today   Studies Reviewed   Previous EP, cardiology notes.    EKG is not ordered. Personal review of EKG from 08/28/2023 shows:  SR at 93bpm;         TTE, 08/07/2023  1. Left ventricular ejection fraction, by estimation, is 30 to 35%. The left ventricle has moderate to severely decreased function. The left ventricle demonstrates global hypokinesis. There is mild left ventricular hypertrophy. Left ventricular diastolic parameters are indeterminate.   2. Right ventricular systolic function is low normal. The right ventricular size is normal.   3. The mitral valve is normal in structure. Mild mitral valve regurgitation.   4. Tricuspid valve regurgitation is mild to moderate.   5. The aortic valve is tricuspid. Aortic valve regurgitation is not visualized.   6. The inferior vena cava is normal in size with <50% respiratory variability, suggesting right atrial pressure of 8 mmHg.   Long term monitor, 12/21/2022 Patient had a min HR of 62 bpm, max HR of 187 bpm, and avg HR of 76 bpm. Predominant underlying rhythm was Sinus Rhythm. 24 Ventricular Tachycardia runs occurred, the run with the fastest interval lasting 7 beats with a max rate of 187 bpm, the longest lasting 13 beats with an avg rate of 128 bpm. 1 run of Supraventricular Tachycardia occurred lasting 6 beats with a max rate of 135 bpm (avg 116 bpm). Isolated SVEs were rare (<1.0%), SVE Couplets were rare (<1.0%), and SVE Triplets were rare (<1.0%).  Isolated VEs were rare (<1.0%, 1371),  VE Couplets were rare (<1.0%, 120), and VE Triplets were rare (<1.0%, 26). Ventricular Bigeminy was present.    Conclusion Occasional nonsustained VT. 1 episode of paroxysmal SVT. No sustained arrhythmias. No atrial fibrillation or atrial flutter.   RHC, 07/28/2022 Successful right heart catheterization via the right antecubital vein. Severely elevated right and left-sided filling pressures, moderate pulmonary hypertension and moderately reduced  cardiac output.   RA: 30 mmHg RV: 56/14 mmHg PW: 34 mmHg PA: 54/30 with a mean of 41 mmHg PA sat is 42% Cardiac output: 4.47 with a cardiac index of 2. CPO is 0.89 PAPI: 0.8   TTE, 07/25/2022  1. Left ventricular ejection fraction, by estimation, is 25 to 30%. Left ventricular ejection fraction by 2D MOD biplane is 21.6 %. The left ventricle has severely decreased function. The left ventricle demonstrates global hypokinesis. Left ventricular diastolic parameters are indeterminate.   2. Right ventricular systolic function is mildly reduced. The right ventricular size is normal. There is moderately elevated pulmonary artery systolic pressure. The estimated right ventricular systolic pressure is 50.7 mmHg.   3. Left atrial size was mildly dilated.   4. A small pericardial effusion is present. The pericardial effusion is circumferential. There is no evidence of cardiac tamponade.   5. The aortic valve is tricuspid. Aortic valve regurgitation is not visualized. No aortic stenosis is present.   6. The mitral valve is normal in structure. Mild mitral valve regurgitation. No evidence of mitral stenosis.   7. Tricuspid valve regurgitation is moderate to severe.     Assessment and Plan     #) NICM #) HFrEF #) ICD in situ Subcutaneous ICD implant 06/2023    #) ***   {Are you ordering a CV Procedure (e.g. stress test, cath, DCCV, TEE, etc)?   Press F2        :[id number]}   Current medicines are reviewed at length with the patient today.   The patient {ACTIONS; HAS/DOES NOT HAVE:19233} concerns regarding her medicines.  The following changes were made today:  {NONE DEFAULTED:18576}  Labs/ tests ordered today include: *** No orders of the defined types were placed in this encounter.    Disposition: Follow up with {EPMDS:28135} or EP APP {EPFOLLOW UP:28173}   Signed, Adaline Holly, NP  09/26/23  2:19 PM  Electrophysiology [facility]

## 2023-09-27 ENCOUNTER — Ambulatory Visit: Payer: Medicaid Other | Admitting: Cardiology

## 2023-09-27 DIAGNOSIS — Z95810 Presence of automatic (implantable) cardiac defibrillator: Secondary | ICD-10-CM

## 2023-09-27 DIAGNOSIS — I5020 Unspecified systolic (congestive) heart failure: Secondary | ICD-10-CM

## 2023-09-27 DIAGNOSIS — I428 Other cardiomyopathies: Secondary | ICD-10-CM

## 2023-09-29 NOTE — Progress Notes (Deleted)
 Electrophysiology Clinic Note    Date:  09/29/2023  Patient ID:  Abel, Ra 1985/10/17, MRN [id number] PCP:  Rockney Cid, DO  Cardiologist:  Constancia Delton, MD Electrophysiologist: Boyce Byes, MD  ***refresh  Discussed the use of AI scribe software for clinical note transcription with the patient, who gave verbal consent to proceed.   Patient Profile    Chief Complaint: routine ICD follow-up  History of Present Illness: Domenique L Inch is a 38 y.o. female with PMH notable for NICM, HFrEF, SubQ ICD, HTN, T2DM, COPD, IDA, T2DM, cocaine use ; seen today for Boyce Byes, MD for routine electrophysiology followup.   She is s/p subcutaneous icd implant 06/2023.   Since last being seen in our clinic the patient reports doing ***.  she denies chest pain, palpitations, dyspnea, PND, orthopnea, nausea, vomiting, dizziness, syncope, edema, weight gain, or early satiety.      Arrhythmia/Device History Bos sci Sub-q ICD, imp 06/2023; dx HFrEF    [facility] ONLY, Do NOT send to [facility]   ROS:  Please see the history of present illness. All other systems are reviewed and otherwise negative.    Physical Exam    VS:  There were no vitals taken for this visit. BMI: There is no height or weight on file to calculate BMI.  Wt Readings from Last 3 Encounters:  09/05/23 212 lb 3.2 oz (96.3 kg)  09/01/23 211 lb 4.8 oz (95.8 kg)  08/26/23 223 lb (101.2 kg)     GEN- The patient is well appearing, alert and oriented x 3 today.   Lungs- Clear to ausculation bilaterally, normal work of breathing.  Heart- {Blank single:19197::"Regular","Irregularly irregular"} rate and rhythm, no murmurs, rubs or gallops Extremities- {EDEMA LEVEL:28147::"No"} peripheral edema, warm, dry Skin-  *** device pocket well-healed, no tethering   Device interrogation done today and reviewed by myself:  Battery *** Lead thresholds, impedence, sensing stable *** *** episodes *** changes  made today   Studies Reviewed   Previous EP, cardiology notes.    EKG is not ordered. Personal review of EKG from 08/28/2023 shows:  SR at 93bpm;         TTE, 08/07/2023  1. Left ventricular ejection fraction, by estimation, is 30 to 35%. The left ventricle has moderate to severely decreased function. The left ventricle demonstrates global hypokinesis. There is mild left ventricular hypertrophy. Left ventricular diastolic parameters are indeterminate.   2. Right ventricular systolic function is low normal. The right ventricular size is normal.   3. The mitral valve is normal in structure. Mild mitral valve regurgitation.   4. Tricuspid valve regurgitation is mild to moderate.   5. The aortic valve is tricuspid. Aortic valve regurgitation is not visualized.   6. The inferior vena cava is normal in size with <50% respiratory variability, suggesting right atrial pressure of 8 mmHg.   Long term monitor, 12/21/2022 Patient had a min HR of 62 bpm, max HR of 187 bpm, and avg HR of 76 bpm. Predominant underlying rhythm was Sinus Rhythm. 24 Ventricular Tachycardia runs occurred, the run with the fastest interval lasting 7 beats with a max rate of 187 bpm, the longest lasting 13 beats with an avg rate of 128 bpm. 1 run of Supraventricular Tachycardia occurred lasting 6 beats with a max rate of 135 bpm (avg 116 bpm). Isolated SVEs were rare (<1.0%), SVE Couplets were rare (<1.0%), and SVE Triplets were rare (<1.0%).  Isolated VEs were rare (<1.0%, 1371),  VE Couplets were rare (<1.0%, 120), and VE Triplets were rare (<1.0%, 26). Ventricular Bigeminy was present.    Conclusion Occasional nonsustained VT. 1 episode of paroxysmal SVT. No sustained arrhythmias. No atrial fibrillation or atrial flutter.   RHC, 07/28/2022 Successful right heart catheterization via the right antecubital vein. Severely elevated right and left-sided filling pressures, moderate pulmonary hypertension and moderately reduced  cardiac output.   RA: 30 mmHg RV: 56/14 mmHg PW: 34 mmHg PA: 54/30 with a mean of 41 mmHg PA sat is 42% Cardiac output: 4.47 with a cardiac index of 2. CPO is 0.89 PAPI: 0.8   TTE, 07/25/2022  1. Left ventricular ejection fraction, by estimation, is 25 to 30%. Left ventricular ejection fraction by 2D MOD biplane is 21.6 %. The left ventricle has severely decreased function. The left ventricle demonstrates global hypokinesis. Left ventricular diastolic parameters are indeterminate.   2. Right ventricular systolic function is mildly reduced. The right ventricular size is normal. There is moderately elevated pulmonary artery systolic pressure. The estimated right ventricular systolic pressure is 50.7 mmHg.   3. Left atrial size was mildly dilated.   4. A small pericardial effusion is present. The pericardial effusion is circumferential. There is no evidence of cardiac tamponade.   5. The aortic valve is tricuspid. Aortic valve regurgitation is not visualized. No aortic stenosis is present.   6. The mitral valve is normal in structure. Mild mitral valve regurgitation. No evidence of mitral stenosis.   7. Tricuspid valve regurgitation is moderate to severe.     Assessment and Plan     #) NICM #) HFrEF #) ICD in situ Subcutaneous ICD implant 06/2023    #) ***   {Are you ordering a CV Procedure (e.g. stress test, cath, DCCV, TEE, etc)?   Press F2        :[id number]}   Current medicines are reviewed at length with the patient today.   The patient {ACTIONS; HAS/DOES NOT HAVE:19233} concerns regarding her medicines.  The following changes were made today:  {NONE DEFAULTED:18576}  Labs/ tests ordered today include: *** No orders of the defined types were placed in this encounter.    Disposition: Follow up with {EPMDS:28135} or EP APP {EPFOLLOW UP:28173}   Signed, Adaline Holly, NP  09/29/23  8:45 PM  Electrophysiology [facility]

## 2023-09-30 ENCOUNTER — Ambulatory Visit: Attending: Cardiology | Admitting: Cardiology

## 2023-09-30 DIAGNOSIS — Z95810 Presence of automatic (implantable) cardiac defibrillator: Secondary | ICD-10-CM

## 2023-09-30 DIAGNOSIS — I428 Other cardiomyopathies: Secondary | ICD-10-CM

## 2023-09-30 DIAGNOSIS — I5020 Unspecified systolic (congestive) heart failure: Secondary | ICD-10-CM

## 2023-09-30 IMAGING — CR DG CHEST 1V
1 series · 1 of 1 positions shown · non-contrast
Comparison: 11/27/2020

CLINICAL DATA: Swelling, history of CHF

EXAM:
CHEST  1 VIEW

[chest pa]
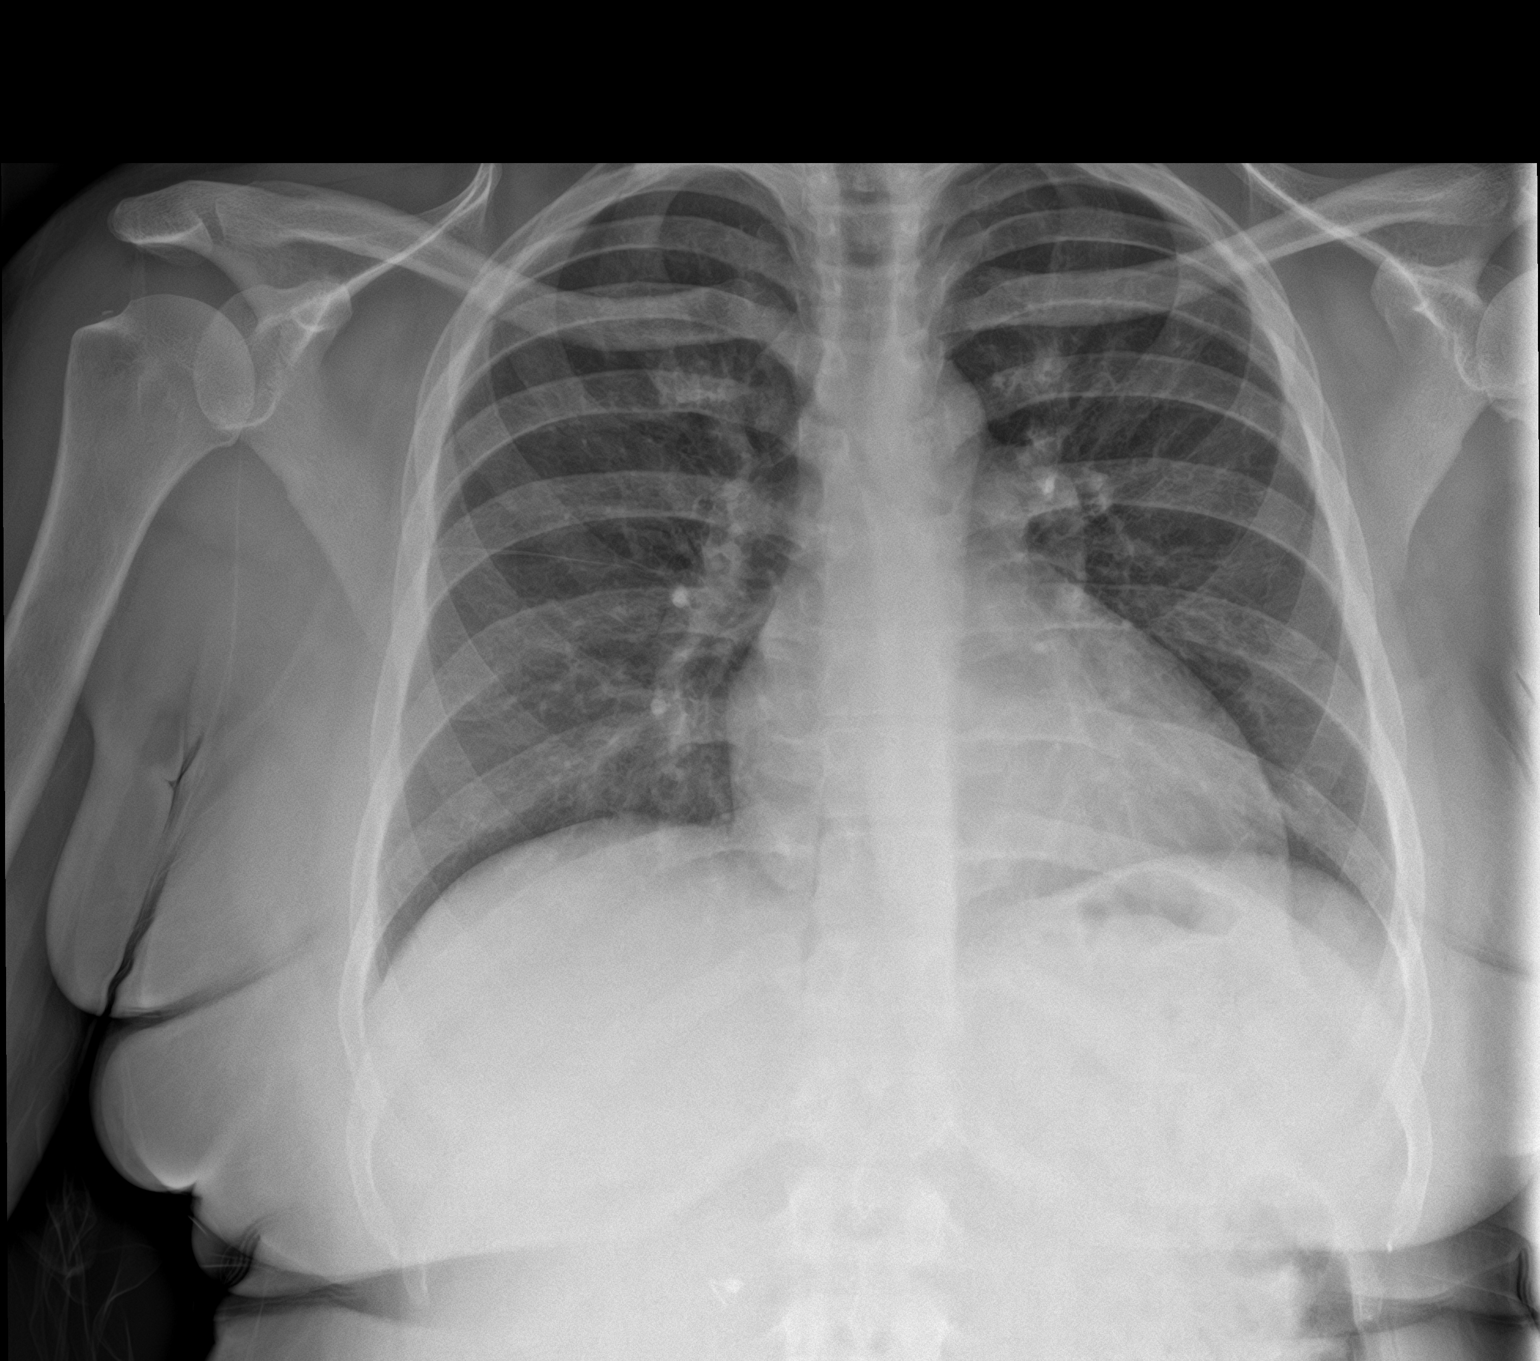

[1 of 1 positions shown; findings below may reference images not displayed]

FINDINGS: Lungs are clear. No frank interstitial edema. No pleural effusion or
pneumothorax.

The heart is normal in size.
IMPRESSION: No evidence of acute cardiopulmonary disease.

## 2023-10-02 ENCOUNTER — Emergency Department

## 2023-10-02 ENCOUNTER — Other Ambulatory Visit: Payer: Self-pay

## 2023-10-02 ENCOUNTER — Emergency Department
Admission: EM | Admit: 2023-10-02 | Discharge: 2023-10-02 | Disposition: A | Discharge status: Home / Self Care | Attending: Emergency Medicine | Admitting: Emergency Medicine

## 2023-10-02 DIAGNOSIS — R112 Nausea with vomiting, unspecified: Secondary | ICD-10-CM | POA: Diagnosis present

## 2023-10-02 DIAGNOSIS — R109 Unspecified abdominal pain: Secondary | ICD-10-CM | POA: Insufficient documentation

## 2023-10-02 DIAGNOSIS — E1122 Type 2 diabetes mellitus with diabetic chronic kidney disease: Secondary | ICD-10-CM | POA: Insufficient documentation

## 2023-10-02 DIAGNOSIS — I130 Hypertensive heart and chronic kidney disease with heart failure and stage 1 through stage 4 chronic kidney disease, or unspecified chronic kidney disease: Secondary | ICD-10-CM | POA: Insufficient documentation

## 2023-10-02 DIAGNOSIS — N189 Chronic kidney disease, unspecified: Secondary | ICD-10-CM | POA: Diagnosis not present

## 2023-10-02 DIAGNOSIS — I509 Heart failure, unspecified: Secondary | ICD-10-CM | POA: Diagnosis not present

## 2023-10-02 DIAGNOSIS — R519 Headache, unspecified: Secondary | ICD-10-CM | POA: Insufficient documentation

## 2023-10-02 LAB — TROPONIN I (HIGH SENSITIVITY)
Troponin I (High Sensitivity): 19 ng/L — ABNORMAL HIGH (ref <18)
Troponin I (High Sensitivity): 21 ng/L — ABNORMAL HIGH (ref <18)

## 2023-10-02 LAB — CBC WITH DIFFERENTIAL/PLATELET
Abs Immature Granulocytes: 0.03 10*3/uL (ref 0.00–0.07)
Basophils Absolute: 0.1 10*3/uL (ref 0.0–0.1)
Basophils Relative: 1 %
Eosinophils Absolute: 0.1 10*3/uL (ref 0.0–0.5)
Eosinophils Relative: 2 %
HCT: 34.2 % — ABNORMAL LOW (ref 36.0–46.0)
Hemoglobin: 10.5 g/dL — ABNORMAL LOW (ref 12.0–15.0)
Immature Granulocytes: 0 %
Lymphocytes Relative: 18 %
Lymphs Abs: 1.2 10*3/uL (ref 0.7–4.0)
MCH: 28 pg (ref 26.0–34.0)
MCHC: 30.7 g/dL (ref 30.0–36.0)
MCV: 91.2 fL (ref 80.0–100.0)
Monocytes Absolute: 0.3 10*3/uL (ref 0.1–1.0)
Monocytes Relative: 4 %
Neutro Abs: 5.2 10*3/uL (ref 1.7–7.7)
Neutrophils Relative %: 75 %
Platelets: 447 10*3/uL — ABNORMAL HIGH (ref 150–400)
RBC: 3.75 MIL/uL — ABNORMAL LOW (ref 3.87–5.11)
RDW: 14.9 % (ref 11.5–15.5)
WBC: 6.9 10*3/uL (ref 4.0–10.5)
nRBC: 0 % (ref 0.0–0.2)

## 2023-10-02 LAB — COMPREHENSIVE METABOLIC PANEL WITH GFR
ALT: 21 U/L (ref 0–44)
AST: 28 U/L (ref 15–41)
Albumin: 3.1 g/dL — ABNORMAL LOW (ref 3.5–5.0)
Alkaline Phosphatase: 85 U/L (ref 38–126)
Anion gap: 7 (ref 5–15)
BUN: 49 mg/dL — ABNORMAL HIGH (ref 6–20)
CO2: 27 mmol/L (ref 22–32)
Calcium: 8.4 mg/dL — ABNORMAL LOW (ref 8.9–10.3)
Chloride: 101 mmol/L (ref 98–111)
Creatinine, Ser: 1.42 mg/dL — ABNORMAL HIGH (ref 0.44–1.00)
GFR, Estimated: 48 mL/min — ABNORMAL LOW (ref >60)
Glucose, Bld: 123 mg/dL — ABNORMAL HIGH (ref 70–99)
Potassium: 4.6 mmol/L (ref 3.5–5.1)
Sodium: 135 mmol/L (ref 135–145)
Total Bilirubin: 0.4 mg/dL (ref 0.0–1.2)
Total Protein: 7.5 g/dL (ref 6.5–8.1)

## 2023-10-02 LAB — BRAIN NATRIURETIC PEPTIDE: B Natriuretic Peptide: 168.8 pg/mL — ABNORMAL HIGH (ref 0.0–100.0)

## 2023-10-02 LAB — RESP PANEL BY RT-PCR (RSV, FLU A&B, COVID)  RVPGX2
Influenza A by PCR: NEGATIVE
Influenza B by PCR: NEGATIVE
Resp Syncytial Virus by PCR: NEGATIVE
SARS Coronavirus 2 by RT PCR: NEGATIVE

## 2023-10-02 LAB — HCG, QUANTITATIVE, PREGNANCY: hCG, Beta Chain, Quant, S: 1 m[IU]/mL (ref <5)

## 2023-10-02 LAB — MAGNESIUM: Magnesium: 2.5 mg/dL — ABNORMAL HIGH (ref 1.7–2.4)

## 2023-10-02 LAB — LIPASE, BLOOD: Lipase: 42 U/L (ref 11–51)

## 2023-10-02 MED ORDER — ONDANSETRON 4 MG PO TBDP
4.0000 mg | ORAL_TABLET | Freq: Three times a day (TID) | ORAL | 0 refills | Status: DC | PRN
  Filled 2023-10-02: qty 20, 7d supply, fill #0

## 2023-10-02 MED ORDER — METOCLOPRAMIDE HCL 5 MG/ML IJ SOLN
10.0000 mg | Freq: Once | INTRAMUSCULAR | Status: AC
  Administered 2023-10-02: 10 mg via INTRAVENOUS
  Filled 2023-10-02: qty 2

## 2023-10-02 MED ORDER — ONDANSETRON HCL 4 MG/2ML IJ SOLN
4.0000 mg | Freq: Once | INTRAMUSCULAR | Status: AC
  Administered 2023-10-02: 4 mg via INTRAVENOUS
  Filled 2023-10-02: qty 2

## 2023-10-02 MED ORDER — ACETAMINOPHEN 500 MG PO TABS
1000.0000 mg | ORAL_TABLET | Freq: Once | ORAL | Status: AC
  Administered 2023-10-02: 1000 mg via ORAL
  Filled 2023-10-02: qty 2

## 2023-10-02 NOTE — ED Notes (Signed)
Pt given bag lunch and ginger ale.

## 2023-10-02 NOTE — ED Provider Notes (Signed)
 [facility] Provider Note    Event Date/Time   First MD Initiated Contact with Patient 10/02/23 [phone]     (approximate)   History   Emesis and Headache   HPI  Molly Howard is a 38 y.o. female who presents to the ED for evaluation of Emesis and Headache   Review of 5/5 CHF clinic visit.  Homeless patient with a history of DM, CKD, nonischemic CHF EF around 30%, ICD in place.  Cocaine abuse./[place]  Patient presents to the ED for evaluation of emesis, abdominal cramping and headache over the past few hours this morning.  Denies shortness of breath, chest pain, syncope.  Reports compliance with her medications.  No dysuria, urinary frequency or changes to voiding.   Physical Exam   Triage Vital Signs: ED Triage Vitals [10/02/23 0810]  Encounter Vitals Group     BP      Systolic BP Percentile      Diastolic BP Percentile      Pulse      Resp      Temp      Temp src      SpO2      Weight 215 lb 8 oz (97.8 kg)     Height 5\' 7"  (1.702 m)     Head Circumference      Peak Flow      Pain Score 8     Pain Loc      Pain Education      Exclude from Growth Chart     Most recent vital signs: Vitals:   10/02/23 1145 10/02/23 1159  BP:  (!) 157/99  Pulse:    Resp: 19   Temp:    SpO2:      General: Awake, no distress.  CV:  Good peripheral perfusion.  Resp:  Normal effort.  Abd:  No distention.  Soft and benign throughout without guarding, tenderness or peritoneal features. MSK:  No deformity noted.  Neuro:  No focal deficits appreciated. Other:     ED Results / Procedures / Treatments   Labs (all labs ordered are listed, but only abnormal results are displayed) Labs Reviewed  COMPREHENSIVE METABOLIC PANEL WITH GFR - Abnormal; Notable for the following components:      Result Value   Glucose, Bld 123 (*)    BUN 49 (*)    Creatinine, Ser 1.42 (*)    Calcium  8.4 (*)    Albumin 3.1 (*)    GFR, Estimated 48 (*)    All  other components within normal limits  CBC WITH DIFFERENTIAL/PLATELET - Abnormal; Notable for the following components:   RBC 3.75 (*)    Hemoglobin 10.5 (*)    HCT 34.2 (*)    Platelets 447 (*)    All other components within normal limits  MAGNESIUM  - Abnormal; Notable for the following components:   Magnesium  2.5 (*)    All other components within normal limits  BRAIN NATRIURETIC PEPTIDE - Abnormal; Notable for the following components:   B Natriuretic Peptide 168.8 (*)    All other components within normal limits  TROPONIN I (HIGH SENSITIVITY) - Abnormal; Notable for the following components:   Troponin I (High Sensitivity) 19 (*)    All other components within normal limits  TROPONIN I (HIGH SENSITIVITY) - Abnormal; Notable for the following components:   Troponin I (High Sensitivity) 21 (*)    All other components within normal limits  RESP PANEL BY  RT-PCR (RSV, FLU A&B, COVID)  RVPGX2  LIPASE, BLOOD  HCG, QUANTITATIVE, PREGNANCY  URINALYSIS, ROUTINE W REFLEX MICROSCOPIC  POC URINE PREG, ED    EKG Sinus rhythm with a rate of 87 bpm.  Normal axis and intervals without clear signs of acute ischemia.  RADIOLOGY CT abdomen/pelvis interpreted by me without evidence of acute pathology  Official radiology report(s): CT ABDOMEN PELVIS WO CONTRAST Result Date: 10/02/2023 CLINICAL DATA:  Nausea and vomiting EXAM: CT ABDOMEN AND PELVIS WITHOUT CONTRAST TECHNIQUE: Multidetector CT imaging of the abdomen and pelvis was performed following the standard protocol without IV contrast. RADIATION DOSE REDUCTION: This exam was performed according to the departmental dose-optimization program which includes automated exposure control, adjustment of the mA and/or kV according to patient size and/or use of iterative reconstruction technique. COMPARISON:  08/07/2023 FINDINGS: Lower chest: Lung bases are free of acute infiltrate or sizable effusion. Minimal scarring is seen. Small 3-4 mm nodule is noted  in the right lower lobe medially stable from the prior exam as well as an exam dating back to 2021 consistent with a benign etiology. Hepatobiliary: No focal liver abnormality is seen. Status post cholecystectomy. No biliary dilatation. Pancreas: Unremarkable. No pancreatic ductal dilatation or surrounding inflammatory changes. Spleen: Normal in size without focal abnormality. Adrenals/Urinary Tract: Adrenal glands are within normal limits. Kidneys demonstrate no renal calculi or obstructive changes. The bladder is partially distended. Stomach/Bowel: The appendix is within normal limits. No obstructive or inflammatory changes of colon are seen. Small bowel and stomach are within normal limits. Vascular/Lymphatic: Aortic atherosclerosis. No enlarged abdominal or pelvic lymph nodes. Reproductive: Uterus and bilateral adnexa are unremarkable. Other: No abdominal wall hernia or abnormality. No abdominopelvic ascites. Musculoskeletal: No acute or significant osseous findings. IMPRESSION: No acute abnormality noted. Small right lower lobe nodule stable from 2021 consistent with a benign etiology. No follow-up is recommended. Electronically Signed   By: Violeta Grey M.D.   On: 10/02/2023 10:06   DG Chest Portable 1 View Result Date: 10/02/2023 CLINICAL DATA:  hx CHF. vague acute illness EXAM: PORTABLE CHEST - 1 VIEW COMPARISON:  August 28, 2023 FINDINGS: Low lung volumes. No focal airspace consolidation, pleural effusion, or pneumothorax. Unchanged cardiomegaly. Left chest wall subcutaneous AICD with a single lead overlying the midline lower chest. No acute fracture or destructive lesion. IMPRESSION: Cardiomegaly.  Otherwise, no acute cardiopulmonary abnormality. Electronically Signed   By: Rance Burrows M.D.   On: 10/02/2023 08:46    PROCEDURES and INTERVENTIONS:  Procedures  Medications  ondansetron  (ZOFRAN ) injection 4 mg (4 mg Intravenous Given 10/02/23 0824)  acetaminophen  (TYLENOL ) tablet 1,000 mg (1,000 mg  Oral Given 10/02/23 0824)  metoCLOPramide (REGLAN) injection 10 mg (10 mg Intravenous Given 10/02/23 1018)     IMPRESSION / MDM / ASSESSMENT AND PLAN / ED COURSE  I reviewed the triage vital signs and the nursing notes.  Differential diagnosis includes, but is not limited to, anasarca or acute volume overload, viral syndrome, acute appendicitis  {Patient presents with symptoms of an acute illness or injury that is potentially life-threatening.  Patient presents for evaluation of headache and emesis, possibly viral syndrome versus secondary gain, without evidence of acute pathology and suitable for outpatient management.  Benign exam and reassuring workup.  CKD near baseline, no leukocytosis, normal lipase.  CT without acute features.  Tolerating p.o. and suitable for outpatient management.  Clinical Course as of 10/02/23 1203  Sun Oct 02, 2023  1015 Reassessed.  Reports some mild persistent nausea without emesis.  We discussed workup so far [DS]  1150 Reassessed.  Patient was feeling okay.  Requesting time to sleep [DS]    Clinical Course User Index [DS] Arline Bennett, MD     FINAL CLINICAL IMPRESSION(S) / ED DIAGNOSES   Final diagnoses:  Nausea and vomiting, unspecified vomiting type     Rx / DC Orders   ED Discharge Orders          Ordered    ondansetron  (ZOFRAN -ODT) 4 MG disintegrating tablet  Every 8 hours PRN        10/02/23 1158             Note:  This document was prepared using Dragon voice recognition software and may include unintentional dictation errors.   Arline Bennett, MD 10/02/23 [phone]

## 2023-10-02 NOTE — ED Notes (Signed)
 Lab notified of serum hcg quant add-on

## 2023-10-02 NOTE — ED Triage Notes (Signed)
 Pt [place] via [facility] [place] for emesis this morning x1 and headache since last night. Pt endorses mild periumbilical abdominal pain. Pt axox4 in no distress.

## 2023-10-02 NOTE — ED Notes (Signed)
 Pt able to disconnect herself from cardiac monitoring, get out of bed, and ambulate to toilet without difficulty. Pt had BM.

## 2023-10-03 ENCOUNTER — Telehealth: Payer: Self-pay | Admitting: Family

## 2023-10-03 ENCOUNTER — Other Ambulatory Visit: Payer: Self-pay

## 2023-10-03 ENCOUNTER — Telehealth (HOSPITAL_COMMUNITY): Payer: Self-pay | Admitting: Licensed Clinical Social Worker

## 2023-10-03 ENCOUNTER — Ambulatory Visit: Admitting: Internal Medicine
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
## 2023-10-03 NOTE — Telephone Encounter (Signed)
 H&V Care Navigation CSW Progress Note  Clinical Social Worker contacted patient by phone to assist with transportation.  Patient is participating in a Managed Medicaid Plan:  Yes  CSW assisted patient with transportation to clinic appointment tomorrow as it is too late to utilize insurance. Patient will be picked up at the [facility] in [place] as she does not have an address. Aubry Blase, LCSW, CCSW-MCS [phone]   SDOH Screenings   Food Insecurity: Food Insecurity Present (08/28/2023)  Housing: High Risk (08/28/2023)  Transportation Needs: Unmet Transportation Needs (10/03/2023)  Utilities: Not At Risk (08/28/2023)  Alcohol Screen: Low Risk  (04/15/2023)  Depression (PHQ2-9): Low Risk  (03/02/2023)  Financial Resource Strain: High Risk (08/25/2023)  Social Connections: Unknown (08/28/2023)  Tobacco Use: Medium Risk (09/05/2023)

## 2023-10-03 NOTE — Telephone Encounter (Signed)
 Called to confirm/remind patient of their appointment at the Advanced Heart Failure Clinic on 10/04/23.   Appointment:   [x] Confirmed  [] Left mess   [] No answer/No voice mail  [] VM Full/unable to leave message  [] Phone not in service  Patient reminded to bring all medications and/or complete list.  Confirmed patient has transportation. Gave directions, instructed to utilize valet parking.

## 2023-10-04 ENCOUNTER — Encounter: Payer: Self-pay | Admitting: Family

## 2023-10-04 ENCOUNTER — Other Ambulatory Visit: Payer: Self-pay

## 2023-10-04 ENCOUNTER — Ambulatory Visit: Attending: Family | Admitting: Family

## 2023-10-04 VITALS — BP 145/76 | HR 95 | Wt 214.0 lb

## 2023-10-04 DIAGNOSIS — I471 Supraventricular tachycardia, unspecified: Secondary | ICD-10-CM | POA: Diagnosis not present

## 2023-10-04 DIAGNOSIS — F17210 Nicotine dependence, cigarettes, uncomplicated: Secondary | ICD-10-CM | POA: Insufficient documentation

## 2023-10-04 DIAGNOSIS — Z91148 Patient's other noncompliance with medication regimen for other reason: Secondary | ICD-10-CM | POA: Diagnosis not present

## 2023-10-04 DIAGNOSIS — I5020 Unspecified systolic (congestive) heart failure: Secondary | ICD-10-CM

## 2023-10-04 DIAGNOSIS — I071 Rheumatic tricuspid insufficiency: Secondary | ICD-10-CM | POA: Insufficient documentation

## 2023-10-04 DIAGNOSIS — Z5902 Unsheltered homelessness: Secondary | ICD-10-CM | POA: Diagnosis not present

## 2023-10-04 DIAGNOSIS — Z5986 Financial insecurity: Secondary | ICD-10-CM | POA: Diagnosis not present

## 2023-10-04 DIAGNOSIS — I10 Essential (primary) hypertension: Secondary | ICD-10-CM

## 2023-10-04 DIAGNOSIS — D631 Anemia in chronic kidney disease: Secondary | ICD-10-CM | POA: Insufficient documentation

## 2023-10-04 DIAGNOSIS — Z79899 Other long term (current) drug therapy: Secondary | ICD-10-CM | POA: Insufficient documentation

## 2023-10-04 DIAGNOSIS — N182 Chronic kidney disease, stage 2 (mild): Secondary | ICD-10-CM | POA: Insufficient documentation

## 2023-10-04 DIAGNOSIS — Z7985 Long-term (current) use of injectable non-insulin antidiabetic drugs: Secondary | ICD-10-CM | POA: Diagnosis not present

## 2023-10-04 DIAGNOSIS — Z5982 Transportation insecurity: Secondary | ICD-10-CM | POA: Insufficient documentation

## 2023-10-04 DIAGNOSIS — I5022 Chronic systolic (congestive) heart failure: Secondary | ICD-10-CM | POA: Diagnosis present

## 2023-10-04 DIAGNOSIS — E1122 Type 2 diabetes mellitus with diabetic chronic kidney disease: Secondary | ICD-10-CM | POA: Insufficient documentation

## 2023-10-04 DIAGNOSIS — I130 Hypertensive heart and chronic kidney disease with heart failure and stage 1 through stage 4 chronic kidney disease, or unspecified chronic kidney disease: Secondary | ICD-10-CM | POA: Insufficient documentation

## 2023-10-04 DIAGNOSIS — Z7984 Long term (current) use of oral hypoglycemic drugs: Secondary | ICD-10-CM | POA: Insufficient documentation

## 2023-10-04 DIAGNOSIS — I428 Other cardiomyopathies: Secondary | ICD-10-CM | POA: Insufficient documentation

## 2023-10-04 DIAGNOSIS — F1910 Other psychoactive substance abuse, uncomplicated: Secondary | ICD-10-CM

## 2023-10-04 DIAGNOSIS — D509 Iron deficiency anemia, unspecified: Secondary | ICD-10-CM | POA: Diagnosis not present

## 2023-10-04 DIAGNOSIS — E1129 Type 2 diabetes mellitus with other diabetic kidney complication: Secondary | ICD-10-CM | POA: Diagnosis not present

## 2023-10-04 DIAGNOSIS — I5082 Biventricular heart failure: Secondary | ICD-10-CM | POA: Diagnosis not present

## 2023-10-04 DIAGNOSIS — Z95810 Presence of automatic (implantable) cardiac defibrillator: Secondary | ICD-10-CM | POA: Diagnosis not present

## 2023-10-04 DIAGNOSIS — Z5900 Homelessness unspecified: Secondary | ICD-10-CM

## 2023-10-04 MED ORDER — OZEMPIC (0.25 OR 0.5 MG/DOSE) 2 MG/3ML ~~LOC~~ SOPN
0.5000 mg | PEN_INJECTOR | SUBCUTANEOUS | 1 refills | Status: DC
  Filled 2023-10-04: qty 3, 28d supply, fill #0

## 2023-10-04 NOTE — Patient Instructions (Addendum)
 Medication Changes:  REFILLED Ozempic    Follow-Up in: Please follow up with the Advanced Heart Failure Clinic in 1 month with Dr. Alease Amend.   At the Advanced Heart Failure Clinic, you and your health needs are our priority. We have a designated team specialized in the treatment of Heart Failure. This Care Team includes your primary Heart Failure Specialized Cardiologist (physician), Advanced Practice Providers (APPs- Physician Assistants and Nurse Practitioners), and Pharmacist who all work together to provide you with the care you need, when you need it.   You may see any of the following providers on your designated Care Team at your next follow up:  Dr. Jules Oar Dr. Peder Bourdon Dr. Alwin Baars Dr. Judyth Nunnery Shawnee Dellen, FNP Bevely Brush, RPH-CPP  Please be sure to bring in all your medications bottles to every appointment.   Need to Contact Us :  If you have any questions or concerns before your next appointment please send us  a message through [place] or call our office at [phone].    TO LEAVE A MESSAGE FOR THE NURSE SELECT OPTION 2, PLEASE LEAVE A MESSAGE INCLUDING: YOUR NAME DATE OF BIRTH CALL BACK NUMBER REASON FOR CALL**this is important as we prioritize the call backs  YOU WILL RECEIVE A CALL BACK THE SAME DAY AS LONG AS YOU CALL BEFORE 4:00 PM

## 2023-10-10 ENCOUNTER — Other Ambulatory Visit: Payer: Self-pay

## 2023-10-10 ENCOUNTER — Emergency Department

## 2023-10-10 DIAGNOSIS — E1122 Type 2 diabetes mellitus with diabetic chronic kidney disease: Secondary | ICD-10-CM | POA: Insufficient documentation

## 2023-10-10 DIAGNOSIS — I129 Hypertensive chronic kidney disease with stage 1 through stage 4 chronic kidney disease, or unspecified chronic kidney disease: Secondary | ICD-10-CM | POA: Insufficient documentation

## 2023-10-10 DIAGNOSIS — Z5900 Homelessness unspecified: Secondary | ICD-10-CM | POA: Insufficient documentation

## 2023-10-10 DIAGNOSIS — F1910 Other psychoactive substance abuse, uncomplicated: Secondary | ICD-10-CM | POA: Insufficient documentation

## 2023-10-10 DIAGNOSIS — D500 Iron deficiency anemia secondary to blood loss (chronic): Secondary | ICD-10-CM | POA: Diagnosis not present

## 2023-10-10 DIAGNOSIS — Z91199 Patient's noncompliance with other medical treatment and regimen due to unspecified reason: Secondary | ICD-10-CM | POA: Insufficient documentation

## 2023-10-10 DIAGNOSIS — Z87891 Personal history of nicotine dependence: Secondary | ICD-10-CM | POA: Insufficient documentation

## 2023-10-10 DIAGNOSIS — I5022 Chronic systolic (congestive) heart failure: Principal | ICD-10-CM | POA: Insufficient documentation

## 2023-10-10 DIAGNOSIS — F1410 Cocaine abuse, uncomplicated: Secondary | ICD-10-CM | POA: Insufficient documentation

## 2023-10-10 DIAGNOSIS — I428 Other cardiomyopathies: Secondary | ICD-10-CM | POA: Insufficient documentation

## 2023-10-10 DIAGNOSIS — N1831 Chronic kidney disease, stage 3a: Secondary | ICD-10-CM | POA: Insufficient documentation

## 2023-10-10 DIAGNOSIS — Z79899 Other long term (current) drug therapy: Secondary | ICD-10-CM | POA: Insufficient documentation

## 2023-10-10 DIAGNOSIS — R0602 Shortness of breath: Secondary | ICD-10-CM | POA: Diagnosis present

## 2023-10-10 LAB — BASIC METABOLIC PANEL WITH GFR
Anion gap: 9 (ref 5–15)
BUN: 43 mg/dL — ABNORMAL HIGH (ref 6–20)
CO2: 21 mmol/L — ABNORMAL LOW (ref 22–32)
Calcium: 7.7 mg/dL — ABNORMAL LOW (ref 8.9–10.3)
Chloride: 110 mmol/L (ref 98–111)
Creatinine, Ser: 1.51 mg/dL — ABNORMAL HIGH (ref 0.44–1.00)
GFR, Estimated: 45 mL/min — ABNORMAL LOW (ref >60)
Glucose, Bld: 144 mg/dL — ABNORMAL HIGH (ref 70–99)
Potassium: 4.3 mmol/L (ref 3.5–5.1)
Sodium: 140 mmol/L (ref 135–145)

## 2023-10-10 LAB — CBC
HCT: 26 % — ABNORMAL LOW (ref 36.0–46.0)
Hemoglobin: 7.8 g/dL — ABNORMAL LOW (ref 12.0–15.0)
MCH: 28.5 pg (ref 26.0–34.0)
MCHC: 30 g/dL (ref 30.0–36.0)
MCV: 94.9 fL (ref 80.0–100.0)
Platelets: 383 10*3/uL (ref 150–400)
RBC: 2.74 MIL/uL — ABNORMAL LOW (ref 3.87–5.11)
RDW: 15.8 % — ABNORMAL HIGH (ref 11.5–15.5)
WBC: 9.4 10*3/uL (ref 4.0–10.5)
nRBC: 0 % (ref 0.0–0.2)

## 2023-10-10 LAB — BRAIN NATRIURETIC PEPTIDE: B Natriuretic Peptide: 934.8 pg/mL — ABNORMAL HIGH (ref 0.0–100.0)

## 2023-10-10 LAB — TROPONIN I (HIGH SENSITIVITY): Troponin I (High Sensitivity): 16 ng/L (ref <18)

## 2023-10-10 NOTE — ED Triage Notes (Signed)
 EMS brings pt in from local store for c/o CP since 1pm; hx CHF, defib; refused ASA and NTG

## 2023-10-10 NOTE — ED Triage Notes (Signed)
 Pt reports chest pain that started earlier today, pt reports she has hx chf. Pt also having some shortness of breath.

## 2023-10-11 ENCOUNTER — Observation Stay

## 2023-10-11 ENCOUNTER — Observation Stay
Admission: EM | Admit: 2023-10-11 | Discharge: 2023-10-12 | Disposition: A | Discharge status: Home / Self Care | Attending: Internal Medicine | Admitting: Internal Medicine

## 2023-10-11 DIAGNOSIS — Z5900 Homelessness unspecified: Secondary | ICD-10-CM

## 2023-10-11 DIAGNOSIS — I5023 Acute on chronic systolic (congestive) heart failure: Principal | ICD-10-CM

## 2023-10-11 DIAGNOSIS — I509 Heart failure, unspecified: Secondary | ICD-10-CM

## 2023-10-11 DIAGNOSIS — Z91148 Patient's other noncompliance with medication regimen for other reason: Secondary | ICD-10-CM

## 2023-10-11 DIAGNOSIS — E1129 Type 2 diabetes mellitus with other diabetic kidney complication: Secondary | ICD-10-CM

## 2023-10-11 DIAGNOSIS — I5033 Acute on chronic diastolic (congestive) heart failure: Secondary | ICD-10-CM

## 2023-10-11 DIAGNOSIS — I5020 Unspecified systolic (congestive) heart failure: Secondary | ICD-10-CM

## 2023-10-11 DIAGNOSIS — D649 Anemia, unspecified: Secondary | ICD-10-CM | POA: Diagnosis not present

## 2023-10-11 LAB — URINE DRUG SCREEN, QUALITATIVE (ARMC ONLY)
Amphetamines, Ur Screen: NOT DETECTED
Barbiturates, Ur Screen: NOT DETECTED
Benzodiazepine, Ur Scrn: NOT DETECTED
Cannabinoid 50 Ng, Ur ~~LOC~~: NOT DETECTED
Cocaine Metabolite,Ur ~~LOC~~: POSITIVE — AB
MDMA (Ecstasy)Ur Screen: NOT DETECTED
Methadone Scn, Ur: NOT DETECTED
Opiate, Ur Screen: NOT DETECTED
Phencyclidine (PCP) Ur S: NOT DETECTED
Tricyclic, Ur Screen: NOT DETECTED

## 2023-10-11 LAB — CBC
HCT: 24.4 % — ABNORMAL LOW (ref 36.0–46.0)
Hemoglobin: 7.2 g/dL — ABNORMAL LOW (ref 12.0–15.0)
MCH: 27.8 pg (ref 26.0–34.0)
MCHC: 29.5 g/dL — ABNORMAL LOW (ref 30.0–36.0)
MCV: 94.2 fL (ref 80.0–100.0)
Platelets: 334 10*3/uL (ref 150–400)
RBC: 2.59 MIL/uL — ABNORMAL LOW (ref 3.87–5.11)
RDW: 15.7 % — ABNORMAL HIGH (ref 11.5–15.5)
WBC: 8.9 10*3/uL (ref 4.0–10.5)
nRBC: 0 % (ref 0.0–0.2)

## 2023-10-11 LAB — CBG MONITORING, ED
Glucose-Capillary: 116 mg/dL — ABNORMAL HIGH (ref 70–99)
Glucose-Capillary: 200 mg/dL — ABNORMAL HIGH (ref 70–99)
Glucose-Capillary: 113 mg/dL — ABNORMAL HIGH (ref 70–99)

## 2023-10-11 LAB — BASIC METABOLIC PANEL WITH GFR
Anion gap: 8 (ref 5–15)
BUN: 40 mg/dL — ABNORMAL HIGH (ref 6–20)
CO2: 21 mmol/L — ABNORMAL LOW (ref 22–32)
Calcium: 7.4 mg/dL — ABNORMAL LOW (ref 8.9–10.3)
Chloride: 108 mmol/L (ref 98–111)
Creatinine, Ser: 1.15 mg/dL — ABNORMAL HIGH (ref 0.44–1.00)
GFR, Estimated: >60 mL/min (ref >60)
Glucose, Bld: 139 mg/dL — ABNORMAL HIGH (ref 70–99)
Potassium: 3.9 mmol/L (ref 3.5–5.1)
Sodium: 137 mmol/L (ref 135–145)

## 2023-10-11 LAB — TYPE AND SCREEN
ABO/RH(D): A POS
Antibody Screen: NEGATIVE

## 2023-10-11 LAB — GLUCOSE, CAPILLARY: Glucose-Capillary: 182 mg/dL — ABNORMAL HIGH (ref 70–99)

## 2023-10-11 LAB — HCG, QUANTITATIVE, PREGNANCY: hCG, Beta Chain, Quant, S: 1 m[IU]/mL (ref <5)

## 2023-10-11 LAB — TROPONIN I (HIGH SENSITIVITY): Troponin I (High Sensitivity): 18 ng/L — ABNORMAL HIGH (ref <18)

## 2023-10-11 MED ORDER — ACETAMINOPHEN 325 MG PO TABS
650.0000 mg | ORAL_TABLET | Freq: Four times a day (QID) | ORAL | Status: DC | PRN
Start: 2023-10-11 — End: 2023-10-12
  Administered 2023-10-12: 650 mg via ORAL
  Filled 2023-10-11: qty 2

## 2023-10-11 MED ORDER — DIGOXIN 125 MCG PO TABS
0.0625 mg | ORAL_TABLET | Freq: Every day | ORAL | Status: DC
  Administered 2023-10-11: 0.0625 mg via ORAL
  Filled 2023-10-11: qty 0.5

## 2023-10-11 MED ORDER — INSULIN ASPART 100 UNIT/ML IJ SOLN
0.0000 [IU] | Freq: Three times a day (TID) | INTRAMUSCULAR | Status: DC
  Administered 2023-10-11 – 2023-10-12 (×2): 3 [IU] via SUBCUTANEOUS
  Administered 2023-10-12: 2 [IU] via SUBCUTANEOUS
  Filled 2023-10-11 (×3): qty 1

## 2023-10-11 MED ORDER — FUROSEMIDE 10 MG/ML IJ SOLN
40.0000 mg | Freq: Two times a day (BID) | INTRAMUSCULAR | Status: DC
  Administered 2023-10-11 – 2023-10-12 (×3): 40 mg via INTRAVENOUS
  Filled 2023-10-11 (×3): qty 4

## 2023-10-11 MED ORDER — ENOXAPARIN SODIUM 60 MG/0.6ML IJ SOSY: 50.0000 mg | PREFILLED_SYRINGE | INTRAMUSCULAR | Status: DC

## 2023-10-11 MED ORDER — CARVEDILOL 3.125 MG PO TABS
3.1250 mg | ORAL_TABLET | Freq: Two times a day (BID) | ORAL | Status: DC
  Administered 2023-10-11 – 2023-10-12 (×2): 3.125 mg via ORAL
  Filled 2023-10-11 (×2): qty 1

## 2023-10-11 MED ORDER — ACETAMINOPHEN 650 MG RE SUPP: 650.0000 mg | Freq: Four times a day (QID) | RECTAL | Status: DC | PRN

## 2023-10-11 MED ORDER — FUROSEMIDE 10 MG/ML IJ SOLN
40.0000 mg | Freq: Once | INTRAMUSCULAR | Status: AC
  Administered 2023-10-11: 40 mg via INTRAVENOUS
  Filled 2023-10-11: qty 4

## 2023-10-11 MED ORDER — ONDANSETRON HCL 4 MG/2ML IJ SOLN: 4.0000 mg | Freq: Four times a day (QID) | INTRAMUSCULAR | Status: DC | PRN

## 2023-10-11 MED ORDER — INSULIN ASPART 100 UNIT/ML IJ SOLN: 0.0000 [IU] | Freq: Every day | INTRAMUSCULAR | Status: DC

## 2023-10-11 MED ORDER — SPIRONOLACTONE 25 MG PO TABS
25.0000 mg | ORAL_TABLET | Freq: Every day | ORAL | Status: DC
  Administered 2023-10-11 – 2023-10-12 (×2): 25 mg via ORAL
  Filled 2023-10-11 (×2): qty 1

## 2023-10-11 MED ORDER — CARVEDILOL 6.25 MG PO TABS
12.5000 mg | ORAL_TABLET | Freq: Two times a day (BID) | ORAL | Status: DC
  Administered 2023-10-11: 12.5 mg via ORAL
  Filled 2023-10-11: qty 2

## 2023-10-11 MED ORDER — DAPAGLIFLOZIN PROPANEDIOL 10 MG PO TABS
10.0000 mg | ORAL_TABLET | Freq: Every day | ORAL | Status: DC
  Administered 2023-10-11 – 2023-10-12 (×2): 10 mg via ORAL
  Filled 2023-10-11 (×2): qty 1

## 2023-10-11 MED ORDER — ONDANSETRON HCL 4 MG PO TABS
4.0000 mg | ORAL_TABLET | Freq: Four times a day (QID) | ORAL | Status: DC | PRN
Start: 2023-10-11 — End: 2023-10-12

## 2023-10-11 MED ORDER — ALBUTEROL SULFATE (2.5 MG/3ML) 0.083% IN NEBU: 2.5000 mg | INHALATION_SOLUTION | RESPIRATORY_TRACT | Status: DC | PRN

## 2023-10-11 MED ORDER — SACUBITRIL-VALSARTAN 97-103 MG PO TABS
1.0000 | ORAL_TABLET | Freq: Two times a day (BID) | ORAL | Status: DC
  Administered 2023-10-11 – 2023-10-12 (×3): 1 via ORAL
  Filled 2023-10-11 (×3): qty 1

## 2023-10-11 MED ORDER — HYDROCODONE-ACETAMINOPHEN 5-325 MG PO TABS
1.0000 | ORAL_TABLET | ORAL | Status: DC | PRN
  Administered 2023-10-11: 1 via ORAL
  Filled 2023-10-11: qty 1

## 2023-10-11 NOTE — TOC Initial Note (Signed)
 Transition of Care [facility]) - Initial/Assessment Note    Patient Details  Name: Molly Howard MRN: [id number] Date of Birth: May 26, 1985  Transition of Care [facility]) CM/SW Contact:    Elsie Halo, RN Phone Number: 10/11/2023, 12:19 PM  Clinical Narrative:                   Expected Discharge Plan: Home/Self Care (patient is homeless and lives in [place]) Barriers to Discharge: Continued Medical Work up   Patient Goals and CMS Choice    TOC visited patient in her room. The patient advised she has been living in [facility] for the past 2 months. [place] stayed [place] her sister who has been evicted. Her sister lives in her car and parks it in her friends driveway. The patient is not allowed at the friends house. The patient advises that application has been submitted for SSDI, but currently she has no income. She advised that she has been denied entry to shelters because of her health condition. Per the patient she no longer has a cellphone, but her DSS Caseworker is supposed to visit her at the park. TOC added a list of community resources/shelters to the patients AVS. The patient will also need a taxi voucher at discharge.         Expected Discharge Plan and Services   Discharge Planning Services: CM Consult   Living arrangements for the past 2 months: Homeless                                      Prior Living Arrangements/Services Living arrangements for the past 2 months: Homeless                     Activities of Daily Living      Permission Sought/Granted                  Emotional Assessment              Admission diagnosis:  CHF (congestive heart failure) (HCC) [I50.9] Patient Active Problem List   Diagnosis Date Noted   CHF (congestive heart failure) (HCC) 10/11/2023   Class 1 obesity 08/31/2023   Hyponatremia 08/31/2023   Acute on chronic HFrEF (heart failure with reduced ejection fraction) (HCC)  08/28/2023   COPD exacerbation (HCC) 08/28/2023   Acute kidney injury superimposed on CKD (HCC) 08/15/2023   Chest pain 08/07/2023   Type 2 diabetes mellitus without complications (HCC) 08/07/2023   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   Primary hypertension 04/16/2023   Controlled type 2 diabetes mellitus without complication, without long-term current use of insulin  (HCC) 04/15/2023   Cardiogenic shock (HCC) 11/14/2022   Bilateral leg edema 11/06/2022   NICM (nonischemic cardiomyopathy) (HCC) 07/31/2022   Hypocalcemia 07/25/2022   Leg edema 07/25/2022   Acute on chronic combined systolic and diastolic CHF (congestive heart failure) (HCC) 07/25/2022   GERD without esophagitis 07/24/2022   Cocaine abuse (HCC) 02/12/2022   Acute pulmonary edema (HCC)    Acute systolic heart failure (HCC) 02/09/2022   Syncope 02/09/2022   Menorrhagia with regular cycle 06/22/2021   Dyslipidemia 11/27/2020   Type II diabetes mellitus with renal manifestations (HCC) 11/27/2020   Chronic kidney disease (CKD), stage II (mild) 11/27/2020   Hypomagnesemia 10/17/2020   Uncontrolled type 2 diabetes mellitus with hyperglycemia, without long-term current use  of insulin  (HCC) 10/11/2020   Dyspnea 05/21/2020   Essential hypertension 05/20/2020   Diabetes mellitus, type II (HCC) 05/20/2020   Chronic anemia 05/20/2020   Reactive thrombocytosis 05/20/2020   Obesity (BMI 30-39.9) 05/20/2020   Chronic combined systolic and diastolic heart failure (HCC) 11/01/2019   Financial difficulties 11/01/2019   Medication management 11/01/2019   Lung nodule 11/01/2019   Nonischemic cardiomyopathy (HCC) 11/01/2019   Polysubstance abuse (HCC) 11/01/2019   Tobacco use 11/01/2019   HFrEF (heart failure with reduced ejection fraction) (HCC)    Swelling    Symptomatic anemia 10/02/2019   PCP:  Rockney Cid, DO Pharmacy:   [facility] [address] [place] [address] Phone: [phone] Fax: [phone]     Social Drivers of Health (SDOH) Social History: SDOH Screenings   Food Insecurity: Food Insecurity Present (08/28/2023)  Housing: High Risk (08/28/2023)  Transportation Needs: Unmet Transportation Needs (10/03/2023)  Utilities: Not At Risk (08/28/2023)  Alcohol Screen: Low Risk  (04/15/2023)  Depression (PHQ2-9): Low Risk  (03/02/2023)  Financial Resource Strain: High Risk (08/25/2023)  Social Connections: Unknown (08/28/2023)  Tobacco Use: Medium Risk (10/10/2023)   SDOH Interventions:     Readmission Risk Interventions    11/08/2022    3:23 PM 07/25/2022   10:35 AM 02/15/2022    2:34 PM  Readmission Risk Prevention Plan  Transportation Screening Complete Complete Complete  PCP or Specialist Appt within 3-5 Days  Complete Complete  Social Work Consult for Recovery Care Planning/Counseling  Complete Complete  Palliative Care Screening  Not Applicable Not Applicable  Medication Review Oceanographer) Complete Complete Complete  PCP or Specialist appointment within 3-5 days of discharge Complete    SW Recovery Care/Counseling Consult Complete    Palliative Care Screening Not Applicable    Skilled Nursing Facility Not Applicable

## 2023-10-11 NOTE — Progress Notes (Signed)
 Patient arrived to 63. Received sitting on the side of the bed and patient placed on telemetry. Night shift RN made aware that patient has arrived.

## 2023-10-11 NOTE — Consult Note (Signed)
 Cardiology Consultation   Patient ID: Molly Howard MRN: [id number]; DOB: 05-Oct-1985  Admit date: 10/11/2023 Date of Consult: 10/11/2023  PCP:  Molly Cid, DO   [facility] Providers Cardiologist:  Molly Delton, MD  Electrophysiologist:  Molly Byes, MD       Patient Profile: Molly Howard is a 38 y.o. female with a hx of combined systolic and diastolic CHF, stage II CKD, type 2 diabetes, hypertension, nonischemic cardiomyopathy, GERD, and polysubstance abuse who is being seen 10/11/2023 for the evaluation of acute on chronic CHF at the request of Dr. Mason Howard.  History of Present Illness:   [name] is a patient of Dr. Junnie Howard and follows with him for management of combined systolic and diastolic HF, tricuspid regurg, nonobstructive CAD, and HTN. Her cardiomyopathy dates back to 2021 with echo at that time showing an EF of 35 to 40%.  Cardiac MRI in 05/2020 showed an EF of 42%, mild LV dilatation, RVEF 50%, and no delayed enhancement. Echo from 07/2022 showed an EF of 25 to 30%, mildly decreased RV systolic function, PASP 50 mmHg, moderate to severe tricuspid regurgitation, and mild mitral regurgitation. RHC in 07/2022 showed elevated right greater than left heart filling pressures, pulmonary venous hypertension, and moderately reduced cardiac output. She has required ongoing hospital admissions for volume overload requiring aggressive diuresis and augmentation with milrinone . Following discharge in 07/2022, she never followed up with cardiology. She was admitted 11/2022 for volume overload after running out of all of her medications. She continued to use cocaine regularly and was cocaine positive that admission. She was followed by the advanced heart failure service during the admission with symptomatic improvement following aggressive diuresis, net -36 L for the admission. She has been evaluated by the advanced heart failure service and is not deemed to be a candidate  for advanced therapies due to persistently positive tox screens for cocaine and medical nonadherence.  Patient was seen by EP and scheduled for ICD implantation.  She was hospitalized 04/2023 after losing housing and needing to move into a motel.  She was not able to take some of her medications including torsemide  for a week.  On admission her medications were restarted.  Patient underwent ICD implantation 06/23/2023.  Echo 08/07/2023 showed EF 30 to 35%, mild LVH, low normal RV function, mild MR, mild to moderate TR, and mild PR.  Patient follows with  heart failure clinic who has been managing GDMT.    Today, patient reports progressive worsening of shortness of breath, abdominal, orthopnea, and lower extremity swelling for the past several days.  She endorses intermittent stabbing chest pain.  She reports that she has not been taking her medications for about 1 month.  She reports that she has all of her medications but has not been taking them.  She has been without housing for the past month.  She has been sleeping in the park.  She also reports increased intake of water due to being outside all day.  She knows that she drinks more than her limit.  She also reports that she has been eating strictly fast food because of the convenience and her inability to keep food with her in the park.  She denies lightheadedness, dizziness, palpitations, diaphoresis, nausea, and PND.  Patient also reports that the last 2 months when she has had her menstrual cycle and has been heavier than normal and she has been passing large clots.  She denies hematochezia.  She denies recent tobacco, alcohol, and  illicit drug use.  In the ED, BP 160/87, HR 101 bpm with otherwise normal vital signs.  Labs notable for glucose 144, BUN 43, creatinine 1.51, and calcium  7.7.  Hemoglobin is low at 7.8 and hematocrit 26.  Chest x-ray shows findings consistent with CHF.  EKG shows sinus rhythm with nonspecific ST/T wave abnormalities.   BNP elevated at 934.  Troponin negative x 2.  Patient was given IV Lasix  40 mg in the ED.  She was restarted on her PTA heart failure medications.  Cardiology was asked to consult for further evaluation and management of chest pain and acute on chronic CHF.  Past Medical History:  Diagnosis Date   Acid reflux    Chronic HFrEF (heart failure with reduced ejection fraction) (HCC)    a. 10/2019 Echo: EF 35-40%, GrII DD; b. 05/2020 Echo: EF 20-25%, glob HK; c. 10/2020 Echo: EF 30-35%, glob HK. GrII DD, Mildly red RV fxn. Mod TR; d.  05/2021 cMRI: EF 42%, no LGE. Nl RV size/fxn.   CKD (chronic kidney disease), stage II    Diabetes mellitus (HCC)    H/O medication noncompliance    Hypertension    Microcytic anemia    NICM (nonischemic cardiomyopathy) (HCC)    a. 10/2019 Echo: EF 35-40%; b. 10/2019 MV: No ischemia. Small apical defect-->breast attenuation; c. 05/2020 Echo: EF 20-25%; d. 10/2020 Echo: EF 30-35%, glob HK. GrII DD, Mildly red RV fxn. Mod TR; e. 05/2021 cMRI: EF 42%, no LGE. Nl RV size/fxn.   Obesity    Polysubstance abuse [facility])     Past Surgical History:  Procedure Laterality Date   CHOLECYSTECTOMY     HERNIA REPAIR     ICD IMPLANT     RIGHT HEART CATH N/A 07/28/2022   Procedure: RIGHT HEART CATH;  Surgeon: Molly Hamilton, MD;  Location: [facility] INVASIVE CV LAB;  Service: Cardiovascular;  Laterality: N/A;   SUBQ ICD IMPLANT N/A 06/27/2023   Procedure: SUBQ ICD IMPLANT;  Surgeon: Molly Byes, MD;  Location: [facility] INVASIVE CV LAB;  Service: Cardiovascular;  Laterality: N/A;       Scheduled Meds:  carvedilol   3.125 mg Oral BID WC   dapagliflozin  propanediol  10 mg Oral Daily   furosemide   40 mg Intravenous Q12H   insulin  aspart  0-15 Units Subcutaneous TID WC   insulin  aspart  0-5 Units Subcutaneous QHS   sacubitril -valsartan   1 tablet Oral BID   spironolactone   25 mg Oral Daily   Continuous Infusions:  PRN Meds: acetaminophen  **OR** acetaminophen , albuterol ,  HYDROcodone -acetaminophen , ondansetron  **OR** ondansetron  (ZOFRAN ) IV  Allergies:    Allergies  Allergen Reactions   Other Rash    Lemons     Social History:   Social History   Socioeconomic History   Marital status: Single    Spouse name: Not on file   Number of children: 0   Years of education: Not on file   Highest education level: 12th grade  Occupational History   Occupation: Diaabled  Tobacco Use   Smoking status: Former    Current packs/day: 0.00    Types: Cigarettes    Quit date: 2022    Years since quitting: 3.4   Smokeless tobacco: Never  Vaping Use   Vaping status: Never Used  Substance and Sexual Activity   Alcohol use: Not Currently   Drug use: Not Currently    Types: Cocaine    Comment: Admits to using cocaine up and will July 2024.   Sexual activity: Not  Currently    Birth control/protection: None  Other Topics Concern   Not on file  Social History Narrative   Lives locally.  Does not routinely exercise.  Has been using cocaine.   Social Drivers of Corporate investment banker Strain: High Risk (08/25/2023)   Overall Financial Resource Strain (CARDIA)    Difficulty of Paying Living Expenses: Very hard  Food Insecurity: Food Insecurity Present (08/28/2023)   Hunger Vital Sign    Worried About Running Out of Food in the Last Year: Sometimes true    Ran Out of Food in the Last Year: Sometimes true  Transportation Needs: Unmet Transportation Needs (10/03/2023)   PRAPARE - Administrator, Civil Service (Medical): Yes    Lack of Transportation (Non-Medical): Yes  Physical Activity: Not on file  Stress: Not on file  Social Connections: Unknown (08/28/2023)   Social Connection and Isolation Panel [NHANES]    Frequency of Communication with Friends and Family: Not on file    Frequency of Social Gatherings with Friends and Family: Not on file    Attends Religious Services: Not on file    Active Member of Clubs or Organizations: Not on file     Attends Banker Meetings: Not on file    Marital Status: Never married  Intimate Partner Violence: Not At Risk (08/28/2023)   Humiliation, Afraid, Rape, and Kick questionnaire    Fear of Current or Ex-Partner: No    Emotionally Abused: No    Physically Abused: No    Sexually Abused: No    Family History:    Family History  Problem Relation Age of Onset   Heart failure Mother        Onset of heart failure 40s.  Died in 05/14/2022.   Diabetes Mother    Hypertension Father    Diabetes Father      ROS:  Please see the history of present illness.   Physical Exam/Data: Vitals:   10/11/23 0200 10/11/23 0530 10/11/23 0745 10/11/23 1046  BP: (!) 160/94  134/86 (!) 140/88  Pulse: 89 85 89 91  Resp:  (!) 22 (!) 27 (!) 21  Temp:    98 F (36.7 C)  TempSrc:    Oral  SpO2: 96% 98% 100% 98%  Weight:      Height:       No intake or output data in the 24 hours ending 10/11/23 1329    10/10/2023   10:07 PM 10/04/2023    2:09 PM 10/02/2023    8:10 AM  Last 3 Weights  Weight (lbs) 219 lb 214 lb 215 lb 8 oz  Weight (kg) 99.338 kg 97.07 kg 97.75 kg     Body mass index is 34.3 kg/m.  General:  Well nourished, well developed, in no acute distress HEENT: normal Neck: no JVD Vascular: No carotid bruits; Distal pulses 2+ bilaterally Cardiac:  normal S1, S2; RRR; no murmur  Lungs:  clear to auscultation bilaterally, no wheezing, rhonchi or rales  Abd: distension noted Ext: Trace LE edema Skin: warm and dry  Psych:  Normal affect   EKG:  The EKG was personally reviewed and demonstrates:  sinus rhythm with nonspecific ST/T wave abnormalities unchanged from prior tracings Telemetry:  Telemetry was personally reviewed and demonstrates:  sinus rhythm with PVCs, one run of NSVT 4 beats  Relevant CV Studies: 08/07/2023 Echo complete 1. Left ventricular ejection fraction, by estimation, is 30 to 35%. The  left ventricle has moderate to  severely decreased function. The left   ventricle demonstrates global hypokinesis. There is mild left ventricular  hypertrophy. Left ventricular  diastolic parameters are indeterminate.   2. Right ventricular systolic function is low normal. The right  ventricular size is normal.   3. The mitral valve is normal in structure. Mild mitral valve  regurgitation.   4. Tricuspid valve regurgitation is mild to moderate.   5. The aortic valve is tricuspid. Aortic valve regurgitation is not  visualized.   6. The inferior vena cava is normal in size with <50% respiratory  variability, suggesting right atrial pressure of 8 mmHg.   Laboratory Data: High Sensitivity Troponin:   Recent Labs  Lab 10/02/23 0813 10/02/23 1017 10/10/23 2209 10/11/23 0122  TROPONINIHS 19* 21* 16 18*     Chemistry Recent Labs  Lab 10/10/23 2209 10/11/23 0534  NA 140 137  K 4.3 3.9  CL 110 108  CO2 21* 21*  GLUCOSE 144* 139*  BUN 43* 40*  CREATININE 1.51* 1.15*  CALCIUM  7.7* 7.4*  GFRNONAA 45* >60  ANIONGAP 9 8    No results for input(s): "PROT", "ALBUMIN", "AST", "ALT", "ALKPHOS", "BILITOT" in the last 168 hours. Lipids No results for input(s): "CHOL", "TRIG", "HDL", "LABVLDL", "LDLCALC", "CHOLHDL" in the last 168 hours.  Hematology Recent Labs  Lab 10/10/23 2209 10/11/23 0534  WBC 9.4 8.9  RBC 2.74* 2.59*  HGB 7.8* 7.2*  HCT 26.0* 24.4*  MCV 94.9 94.2  MCH 28.5 27.8  MCHC 30.0 29.5*  RDW 15.8* 15.7*  PLT 383 334   Thyroid  No results for input(s): "TSH", "FREET4" in the last 168 hours.  BNP Recent Labs  Lab 10/10/23 2209  BNP 934.8*    DDimer No results for input(s): "DDIMER" in the last 168 hours.  Radiology/Studies:  DG Chest 2 View Result Date: 10/10/2023 CLINICAL DATA:  Chest pain EXAM: CHEST - 2 VIEW COMPARISON:  Radiograph 10/02/2023 FINDINGS: Stable cardiomegaly interstitial coarsening in the mid and lower lungs with ground-glass opacities in the right lower lung. No pleural effusion or pneumothorax. Left ICD  IMPRESSION: Findings most suggestive of CHF with mild interstitial edema. Atypical infection could appear similarly. Electronically Signed   By: Rozell Cornet M.D.   On: 10/10/2023 22:43     Assessment and Plan:  Acute on chronic systolic heart failure - Patient noncompliant with medications for past month with symptoms of shortness of breath, orthopnea, abdominal, and lower extremity swelling - Most recent echo 08/07/2023 with EF 30 to 35% - Recommend IV diuresis with Lasix  40 mg twice daily - Continue to monitor kidney function, strict I's and O's, and daily weights with ongoing diuresis - Recommend restarting GDMT including Entresto , spironolactone , and Farxiga  - Will start carvedilol  at 3.125 twice daily given she has been without this medication for months - Patient is not a good candidate for digoxin  in the setting of noncompliance - Recommend social work consult  Chest pain - Patient reports intermittent stabbing chest pain for the past several days - Troponin negative x 2 - EKG with ST/T wave abnormalities consistent with prior tracings, no acute ischemic changes - Consider ischemic evaluation either prior to discharge or as outpatient  Hypertension - Blood pressure improving, continue antihypertensives as above  Anemia - Hemoglobin 7.2, down from 10.5 on 6/1 - Patient reports heavy menstrual cycles and passing large clots for the past 2 months - Ongoing evaluation and management per IM  Substance use - Patient denies recent tobacco, alcohol, and illicit drug use -  UDS pending  CKD IIIa - Kidney function appears near baseline - Continue to monitor with ongoing diuresis  For questions or updates, please contact [facility] Please consult [email or web address] for contact info under    Signed, Brodie Cannon, PA-C  10/11/2023 1:29 PM

## 2023-10-11 NOTE — Progress Notes (Signed)
 Heart Failure Stewardship Pharmacy Note  PCP: Rockney Cid, DO PCP-Cardiologist: Constancia Delton, MD  HPI: Molly Howard is a 37 y.o. female with combined systolic and diastolic CHF, stage II chronic kidney disease, type 2 diabetes mellitus, hypertension, and nonischemic cardiomyopathy, GERD as well as polysubstance abuse with multiple recent CHF admissions who presented with shortness of breath, abdominal distention, orthopnea, and intermittent chest pain. Has difficulty with medication adherence and appears to have been out of medications for ~1 month. She has been alternating between staying in a motel and a park since discharge. On admission, BNP 934.8, HS-troponin was 16, UDS pending. Chest x-ray noted to be consistent with CHF.  Patient reports that she is not likely to be adherent to her medication regimen until she has a stable living condition. She is currently working with a Clinical research associate for disability and with social work for housing, however, this has been a long process and is not guaranteed to yield results.   Pertinent Cardiac History: Noted to have CHF in 2021 where echo showed LVEF of 35-40%. Cardiac MRI in 05/2020 showed an EF of 42%, mild LV dilatation, RVEF 50%, with no delayed enhancement. Echo in 07/2022 with LVEF of 25 to 30%, mildly decreased RV systolic function, moderate to severe tricuspid regurgitation, and mild mitral regurgitation. RHC in 07/2022 with severely elevated RA pressure of 30, CO/CI low at 4.47/2, COP of 0.89, PAPI of 0.8,  and high PA pressures due to volume overload. Since, she has had multiple admissions for CHF. Admitted 02/09/22 due to acute on chronic HF. + cocaine. Admitted 07/24/22 due to decompensated HFrEF. Required milrinone  drip and diuresed from 116 kg >> 91 kg. Admitted 11/05/22 due to swelling and fluid retention in arms and legs along with some central chest tightness and shortness of breath. Required milrinone  for diuresis. Zio patch 12/2022 with 24 VT  runs with no sustained arrhythmias. Received subcutaneous ICD 06/2023. Echocardiogram 08/07/23 showed LVEF of 30-35%, mild LVH, low normal RV function, mild MR, mild-moderate TR, mild PR.   Pertinent Lab Values: Creat  Date Value Ref Range Status  12/14/2022 1.45 (H) 0.50 - 0.97 mg/dL Final   Creatinine, Ser  Date Value Ref Range Status  10/11/2023 1.15 (H) 0.44 - 1.00 mg/dL Final   BUN  Date Value Ref Range Status  10/11/2023 40 (H) 6 - 20 mg/dL Final  14/78/2956 15 6 - 20 mg/dL Final   Potassium  Date Value Ref Range Status  10/11/2023 3.9 3.5 - 5.1 mmol/L Final   Sodium  Date Value Ref Range Status  10/11/2023 137 135 - 145 mmol/L Final  06/13/2023 142 134 - 144 mmol/L Final   B Natriuretic Peptide  Date Value Ref Range Status  10/10/2023 934.8 (H) 0.0 - 100.0 pg/mL Final    Comment:    Performed at [facility], [address], [place], [place] [address]   Magnesium   Date Value Ref Range Status  10/02/2023 2.5 (H) 1.7 - 2.4 mg/dL Final    Comment:    Performed at [facility], [address] [place], [place] [address]   Hgb A1c MFr Bld  Date Value Ref Range Status  08/06/2023 6.7 (H) 4.8 - 5.6 % Final    Comment:    (NOTE)         Prediabetes: 5.7 - 6.4         Diabetes: >6.4         Glycemic control for adults with diabetes: <7.0  Digoxin  Level  Date Value Ref Range Status  04/20/2023 0.3 (L) 0.8 - 2.0 ng/mL Final    Comment:    Performed at [facility], [address] [place], [place] [address]   TSH  Date Value Ref Range Status  11/09/2021 5.114 (H) 0.350 - 4.500 uIU/mL Final    Comment:    Performed by a 3rd Generation assay with a functional sensitivity of <=0.01 uIU/mL. Performed at [facility], [address] [place], [place] [address]    LDH  Date Value Ref Range Status  04/15/2023 216 (H) 98 - 192 U/L Final    Comment:    Performed at [facility], [address] [place],  [place] [address]    Vital Signs:  Temp:  [98.1 F (36.7 C)-99 F (37.2 C)] 99 F (37.2 C) (06/10 0123) Pulse Rate:  [85-101] 89 (06/10 0745) Cardiac Rhythm: Normal sinus rhythm (06/10 0432) Resp:  [18-27] 27 (06/10 0745) BP: (134-160)/(86-94) 134/86 (06/10 0745) SpO2:  [96 %-100 %] 100 % (06/10 0745) Weight:  [99.3 kg (219 lb)] 99.3 kg (219 lb) (06/09 2207) No intake or output data in the 24 hours ending 10/11/23 0832  Current Heart Failure Medications:  Loop diuretic: torsemide  40 mg daily Beta-Blocker: carvedilol  12.5 mg BID  ACEI/ARB/ARNI: Entresto  97-103 mg BID MRA: spironolactone  25 mg daily SGLT2i: Farxiga  10 mg daily Other: digoxin  0.0625 mg daily  Prior to admission Heart Failure Medications:  Out of medications for ~1 month Loop diuretic: torsemide  40 mg daily Beta-Blocker: carvedilol  12.5 mg BID  ACEI/ARB/ARNI: Entresto  97-103 mg BID MRA: spironolactone  25 mg daily SGLT2i: Farxiga  10 mg daily Other: digoxin  0.0625 mg daily  Assessment: 1. Acute on chronic systolic heart failure (LVEF 30-35%) with low normal RV function, due to NICM. NYHA class II symptoms.  -Symptoms: Patient reports shortness of breath with exertion, orthopnea, abdominal distention, and LEE have all mildly improved. -Volume: Patient is hypervolemic. LEE is mild, however the patient has significant abdominal distention. Currently on furosemide  40 mg IV BID with decent urine output. -Hemodynamics: BP elevated on admission, however patient was not taking home medications. HR 80-90s. -BB: Continue carvedilol  12.5 mg BID. Does not appear to be low output. -ACEI/ARB/ARNI: Continue Entresto  97/103 mg BID. -MRA: Continue spironolactone  25 mg daily -SGLT2i: Continue furosemide  40 mg IV BID  Plan: 1) Medication changes recommended at this time: -None  2) Patient assistance: -Copays are $4  3) Education: - Patient has been educated on current HF medications and potential additions to HF medication  regimen - Patient verbalizes understanding that over the next few months, these medication doses may change and more medications may be added to optimize HF regimen - Patient has been educated on basic disease state pathophysiology and goals of therapy  Medication Assistance / Insurance Benefits Check: Does the patient have prescription insurance?    Type of insurance plan:  Does the patient qualify for medication assistance through manufacturers or grants? No  Outpatient Pharmacy: Prior to admission outpatient pharmacy: [facility]      Please do not hesitate to reach out with questions or concerns,  Bevely Brush, PharmD, CPP, BCPS Heart Failure Pharmacist  Phone - [phone] 10/11/2023 8:32 AM

## 2023-10-11 NOTE — Progress Notes (Signed)
 PHARMACIST - PHYSICIAN COMMUNICATION  CONCERNING:  Enoxaparin  (Lovenox ) for DVT Prophylaxis    RECOMMENDATION: Patient was prescribed enoxaprin 40mg  q24 hours for VTE prophylaxis.   Filed Weights   10/10/23 2207  Weight: 99.3 kg (219 lb)    Body mass index is 34.3 kg/m.  Estimated Creatinine Clearance: 60.6 mL/min (A) (by C-G formula based on SCr of 1.51 mg/dL (H)).   Based on [facility] policy patient is candidate for enoxaparin  0.5mg /kg TBW SQ every 24 hours based on BMI being >30.  DESCRIPTION: Pharmacy has adjusted enoxaparin  dose per [facility] policy.  Patient is now receiving enoxaparin  0.5 mg/kg every 24 hours   Coretta Dexter, PharmD, [facility] 10/11/2023 2:21 AM

## 2023-10-11 NOTE — Final Progress Note (Signed)
 Same-day rounding progress note.    Patient seen and examined while in the ED.  Waiting for floor bed.  I agree with assessment and plan dictated by Dr. Vallarie Gauze.  Please see her dictated H&P for further details.  HFrEF (heart failure with reduced ejection fraction) (HCC) NICM s/p AICD-(EF 30 to 35% 08/07/2023) Cardiology consultation Diuresis with IV Lasix  TOC consult  Blood loss anemia due to heavy menstruation for at least last 2 months.  Hemoglobin 7.2.  Will transfuse if hemoglobin drops less than 7 Will obtain pelvic ultrasound.  Consider OB consult  Time spent: 62

## 2023-10-11 NOTE — Progress Notes (Signed)
 Heart Failure Navigator Progress Note  Assessed for Heart & Vascular TOC clinic readiness.  Patient does not meet criteria due to current Advanced Heart Failure Team patient.   Navigator will sign off at this time.  Roxy Horseman, RN, BSN [facility] Heart Failure Navigator Secure Chat Only

## 2023-10-11 NOTE — ED Notes (Signed)
Informed RN bed assigned 

## 2023-10-11 NOTE — ED Provider Notes (Signed)
 [facility] Provider Note    Event Date/Time   First MD Initiated Contact with Patient 10/11/23 [phone]     (approximate)   History   Chest Pain   HPI  Molly Howard is a 38 y.o. 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  Reports she is not able to take her medications because of homelessness.  She denies any fevers, cough, significant increased lower extremity swelling or discomfort.  States symptoms ongoing for several days now.  She denies any recent drug use.   History provided by patient.    Past Medical History:  Diagnosis Date   Acid reflux    Chronic HFrEF (heart failure with reduced ejection fraction) (HCC)    a. 10/2019 Echo: EF 35-40%, GrII DD; b. 05/2020 Echo: EF 20-25%, glob HK; c. 10/2020 Echo: EF 30-35%, glob HK. GrII DD, Mildly red RV fxn. Mod TR; d.  05/2021 cMRI: EF 42%, no LGE. Nl RV size/fxn.   CKD (chronic kidney disease), stage II    Diabetes mellitus (HCC)    H/O medication noncompliance    Hypertension    Microcytic anemia    NICM (nonischemic cardiomyopathy) (HCC)    a. 10/2019 Echo: EF 35-40%; b. 10/2019 MV: No ischemia. Small apical defect-->breast attenuation; c. 05/2020 Echo: EF 20-25%; d. 10/2020 Echo: EF 30-35%, glob HK. GrII DD, Mildly red RV fxn. Mod TR; e. 05/2021 cMRI: EF 42%, no LGE. Nl RV size/fxn.   Obesity    Polysubstance abuse [facility])     Past Surgical History:  Procedure Laterality Date   CHOLECYSTECTOMY     HERNIA REPAIR     ICD IMPLANT     RIGHT HEART CATH N/A 07/28/2022   Procedure: RIGHT HEART CATH;  Surgeon: Wenona Hamilton, MD;  Location: [facility] INVASIVE CV LAB;  Service: Cardiovascular;  Laterality: N/A;   SUBQ ICD IMPLANT N/A 06/27/2023   Procedure: SUBQ ICD IMPLANT;  Surgeon: Boyce Byes, MD;  Location: [facility]  INVASIVE CV LAB;  Service: Cardiovascular;  Laterality: N/A;    MEDICATIONS:  Prior to Admission medications   Medication Sig Start Date End Date Taking? Authorizing Provider  Accu-Chek Softclix Lancets lancets Use as instructed 11/18/22   Rockney Cid, DO  amLODipine  (NORVASC ) 10 MG tablet Take 1 tablet (10 mg total) by mouth daily. 09/05/23 12/04/23  Charlette Console, FNP  carvedilol  (COREG ) 12.5 MG tablet Take 1 tablet (12.5 mg total) by mouth 2 (two) times daily with a meal. 08/08/23   Darus Engels A, DO  Continuous Glucose Sensor (FREESTYLE LIBRE 3 SENSOR) MISC Place 1 sensor on the skin every 14 days. Use to check glucose continuously 01/14/23   Rockney Cid, DO  dapagliflozin  propanediol (FARXIGA ) 10 MG TABS tablet Take 1 tablet (10 mg total) by mouth once daily. 08/08/23   Montey Apa, DO  digoxin  (LANOXIN ) 0.125 MG tablet Take 0.5 tablets (0.0625 mg total) by mouth daily. 08/08/23   Montey Apa, DO  ondansetron  (ZOFRAN -ODT) 4 MG disintegrating tablet Take 1 tablet (4 mg total) by mouth every 8 (eight) hours as needed. 10/02/23   Arline Bennett, MD  sacubitril -valsartan  (ENTRESTO ) 97-103 MG Take 1 tablet by mouth 2 (two) times daily.    [provider]  Semaglutide ,0.25 or 0.5MG /DOS, (OZEMPIC , 0.25 OR 0.5 MG/DOSE,) 2 MG/3ML SOPN Inject 0.5 mg into the skin once  a week. 10/04/23   Charlette Console, FNP  spironolactone  (ALDACTONE ) 25 MG tablet Take 1 tablet (25 mg total) by mouth daily. 08/08/23   Montey Apa, DO  torsemide  (DEMADEX ) 20 MG tablet Take 2 tablets (40 mg total) by mouth daily Patient not taking: Reported on 10/04/2023 08/08/23 09/07/23  Darus Engels A, DO  Potassium Chloride  40 MEQ/15ML (20%) SOLN Take 40 mEq by mouth 2 (two) times daily. Patient not taking: Reported on 06/07/2023 07/04/20 10/18/20  Constancia Delton, MD    Physical Exam   Triage Vital Signs: ED Triage Vitals  Encounter Vitals Group     BP 10/10/23 2208 (!) 160/87     Systolic BP  Percentile --      Diastolic BP Percentile --      Pulse Rate 10/10/23 2208 (!) 101     Resp 10/10/23 2208 18     Temp 10/10/23 2208 98.1 F (36.7 C)     Temp src --      SpO2 10/10/23 2200 96 %     Weight 10/10/23 2207 219 lb (99.3 kg)     Height 10/10/23 2207 5\' 7"  (1.702 m)     Head Circumference --      Peak Flow --      Pain Score 10/10/23 2207 8     Pain Loc --      Pain Education --      Exclude from Growth Chart --     Most recent vital signs: Vitals:   10/11/23 0530 10/11/23 0745  BP:  134/86  Pulse: 85 89  Resp: (!) 22 (!) 27  Temp:    SpO2: 98% 100%    CONSTITUTIONAL: Alert, responds appropriately to questions. Well-appearing; well-nourished HEAD: Normocephalic, atraumatic EYES: Conjunctivae clear, pupils appear equal, sclera nonicteric ENT: normal nose; moist mucous membranes NECK: Supple, normal ROM CARD: RRR; S1 and S2 appreciated RESP: Diffuse rales on exam.  No rhonchi, wheezing.  No respiratory distress or hypoxia. ABD/GI: Non-distended; soft, non-tender, no rebound, no guarding, no peritoneal signs BACK: The back appears normal EXT: Normal ROM in all joints; no deformity noted, no edema, no calf tenderness or calf swelling SKIN: Normal color for age and race; warm; no rash on exposed skin NEURO: Moves all extremities equally, normal speech PSYCH: The patient's mood and manner are appropriate.   ED Results / Procedures / Treatments   LABS: (all labs ordered are listed, but only abnormal results are displayed) Labs Reviewed  BASIC METABOLIC PANEL WITH GFR - Abnormal; Notable for the following components:      Result Value   CO2 21 (*)    Glucose, Bld 144 (*)    BUN 43 (*)    Creatinine, Ser 1.51 (*)    Calcium  7.7 (*)    GFR, Estimated 45 (*)    All other components within normal limits  CBC - Abnormal; Notable for the following components:   RBC 2.74 (*)    Hemoglobin 7.8 (*)    HCT 26.0 (*)    RDW 15.8 (*)    All other components within  normal limits  BRAIN NATRIURETIC PEPTIDE - Abnormal; Notable for the following components:   B Natriuretic Peptide 934.8 (*)    All other components within normal limits  CBC - Abnormal; Notable for the following components:   RBC 2.59 (*)    Hemoglobin 7.2 (*)    HCT 24.4 (*)    MCHC 29.5 (*)    RDW 15.7 (*)  All other components within normal limits  BASIC METABOLIC PANEL WITH GFR - Abnormal; Notable for the following components:   CO2 21 (*)    Glucose, Bld 139 (*)    BUN 40 (*)    Creatinine, Ser 1.15 (*)    Calcium  7.4 (*)    All other components within normal limits  CBG MONITORING, ED - Abnormal; Notable for the following components:   Glucose-Capillary 116 (*)    All other components within normal limits  TROPONIN I (HIGH SENSITIVITY) - Abnormal; Notable for the following components:   Troponin I (High Sensitivity) 18 (*)    All other components within normal limits  HCG, QUANTITATIVE, PREGNANCY  URINE DRUG SCREEN, QUALITATIVE ([facility] ONLY)  TYPE AND SCREEN  TROPONIN I (HIGH SENSITIVITY)     EKG:  EKG Interpretation Date/Time:  Monday October 10 2023 22:10:45 EDT Ventricular Rate:  103 PR Interval:  114 QRS Duration:  84 QT Interval:  360 QTC Calculation: 471 R Axis:   31  Text Interpretation: Sinus tachycardia Possible Anterior infarct , age undetermined ST & T wave abnormality, consider lateral ischemia Abnormal ECG When compared with ECG of 02-Oct-2023 08:12, PREVIOUS ECG IS PRESENT Confirmed by UNCONFIRMED, DOCTOR ([id number]), editor Ara Bays [id number]) on 10/11/2023 6:47:34 AM         RADIOLOGY: My personal review and interpretation of imaging: X-ray shows edema.  I have personally reviewed all radiology reports.   DG Chest 2 View Result Date: 10/10/2023 CLINICAL DATA:  Chest pain EXAM: CHEST - 2 VIEW COMPARISON:  Radiograph 10/02/2023 FINDINGS: Stable cardiomegaly interstitial coarsening in the mid and lower lungs with ground-glass opacities in the right lower  lung. No pleural effusion or pneumothorax. Left ICD IMPRESSION: Findings most suggestive of CHF with mild interstitial edema. Atypical infection could appear similarly. Electronically Signed   By: Rozell Cornet M.D.   On: 10/10/2023 22:43     PROCEDURES:  Critical Care performed: No    .1-3 Lead EKG Interpretation  Performed by: Bradley Bostelman, Clover Dao, DO Authorized by: Syair Fricker, Clover Dao, DO     Interpretation: normal     ECG rate:  89   ECG rate assessment: normal     Rhythm: sinus rhythm     Ectopy: none     Conduction: normal       IMPRESSION / MDM / ASSESSMENT AND PLAN / ED COURSE  I reviewed the triage vital signs and the nursing notes.    Patient here with chest pain, shortness of breath.  History of drug-induced nonischemic cardiomyopathy.  Not taking her diuretics.  The patient is on the cardiac monitor to evaluate for evidence of arrhythmia and/or significant heart rate changes.   DIFFERENTIAL DIAGNOSIS (includes but not limited to):   CHF, ACS, PE, dissection less likely, pneumonia, pneumothorax, anemia   Patient's presentation is most consistent with acute presentation with potential threat to life or bodily function.   PLAN: Patient's hemoglobin is 7.8.  This is down from 10.5 about a week ago.  She does report she is having her menstrual cycle and has had heavy periods.  She does report having prior blood transfusions.  She denies any bloody stools, melena.  She has not on any blood thinners.  Given she does not technically have any true underlying cardiac disease as her cardiomyopathy is due to cocaine use, will hold on transfusion given she is hemodynamically stable and not actively bleeding.  Will continue to monitor this closely.  Troponin x 2 is flat.  BNP elevated at 934.  Chest x-ray reviewed and interpreted by myself and the radiologist and shows interstitial edema.  Will give IV Lasix  and admit.  Patient denies any recent drug use.  Will obtain  UDS.   MEDICATIONS GIVEN IN ED: Medications  digoxin  (LANOXIN ) tablet 0.0625 mg (has no administration in time range)  carvedilol  (COREG ) tablet 12.5 mg (has no administration in time range)  sacubitril -valsartan  (ENTRESTO ) 97-103 mg per tablet (has no administration in time range)  spironolactone  (ALDACTONE ) tablet 25 mg (has no administration in time range)  dapagliflozin  propanediol (FARXIGA ) tablet 10 mg (has no administration in time range)  acetaminophen  (TYLENOL ) tablet 650 mg (has no administration in time range)    Or  acetaminophen  (TYLENOL ) suppository 650 mg (has no administration in time range)  ondansetron  (ZOFRAN ) tablet 4 mg (has no administration in time range)    Or  ondansetron  (ZOFRAN ) injection 4 mg (has no administration in time range)  furosemide  (LASIX ) injection 40 mg (has no administration in time range)  insulin  aspart (novoLOG ) injection 0-15 Units (has no administration in time range)  insulin  aspart (novoLOG ) injection 0-5 Units (has no administration in time range)  HYDROcodone -acetaminophen  (NORCO/VICODIN) 5-325 MG per tablet 1-2 tablet (has no administration in time range)  albuterol  (PROVENTIL ) (2.5 MG/3ML) 0.083% nebulizer solution 2.5 mg (has no administration in time range)  furosemide  (LASIX ) injection 40 mg (40 mg Intravenous Given 10/11/23 0129)     ED COURSE:  Consulted and discussed patient's case with hospitalist, Dr. Vallarie Gauze.  I have recommended admission and consulting physician agrees and will place admission orders.  Patient (and family if present) agree with this plan.   I reviewed all nursing notes, vitals, pertinent previous records.  All labs, EKGs, imaging ordered have been independently reviewed and interpreted by myself.      OUTSIDE RECORDS REVIEWED: Reviewed recent cardiology notes.       FINAL CLINICAL IMPRESSION(S) / ED DIAGNOSES   Final diagnoses:  Acute on chronic systolic congestive heart failure (HCC)  Anemia,  unspecified type  Homelessness  History of medication noncompliance     Rx / DC Orders   ED Discharge Orders     None        Note:  This document was prepared using Dragon voice recognition software and may include unintentional dictation errors.   Kalima Saylor, Clover Dao, DO 10/11/23 [phone]

## 2023-10-11 NOTE — H&P (Signed)
 History and Physical    Patient: Molly Howard:[id number] DOB: 11-10-1985 DOA: 10/11/2023 DOS: the patient was seen and examined on 10/11/2023 PCP: Rockney Cid, DO  Patient coming from: Home  Chief Complaint:  Chief Complaint  Patient presents with   Chest Pain    HPI: Molly Howard is a 38 y.o. female with medical history significant for Homelessness, CKD llla, type 2 diabetes mellitus, nonischemic cardiomyopathy last EF 30%, s/p  ICD 06/2023  essential hypertension,  polysubstance abuse , multiple recent admissions for chest pain/CHF/COPD being admitted with CHF exacerbation after presenting with shortness of breath and abdominal distention along with orthopnea and intermittent chest pain, BNP elevated to 938 up from 168 a couple weeks prior and chest x-ray consistent with CHF.  New finding included a hemoglobin of 7.8 down from 10.5 a couple weeks ago.  Denies black or bloody stool.  She was treated with IV Lasix  40 mg.  Admission requested     Review of Systems: As mentioned in the history of present illness. All other systems reviewed and are negative.  Past Medical History:  Diagnosis Date   Acid reflux    Chronic HFrEF (heart failure with reduced ejection fraction) (HCC)    a. 10/2019 Echo: EF 35-40%, GrII DD; b. 05/2020 Echo: EF 20-25%, glob HK; c. 10/2020 Echo: EF 30-35%, glob HK. GrII DD, Mildly red RV fxn. Mod TR; d.  05/2021 cMRI: EF 42%, no LGE. Nl RV size/fxn.   CKD (chronic kidney disease), stage II    Diabetes mellitus (HCC)    H/O medication noncompliance    Hypertension    Microcytic anemia    NICM (nonischemic cardiomyopathy) (HCC)    a. 10/2019 Echo: EF 35-40%; b. 10/2019 MV: No ischemia. Small apical defect-->breast attenuation; c. 05/2020 Echo: EF 20-25%; d. 10/2020 Echo: EF 30-35%, glob HK. GrII DD, Mildly red RV fxn. Mod TR; e. 05/2021 cMRI: EF 42%, no LGE. Nl RV size/fxn.   Obesity    Polysubstance abuse [facility])    Past Surgical History:  Procedure  Laterality Date   CHOLECYSTECTOMY     HERNIA REPAIR     ICD IMPLANT     RIGHT HEART CATH N/A 07/28/2022   Procedure: RIGHT HEART CATH;  Surgeon: Wenona Hamilton, MD;  Location: [facility] INVASIVE CV LAB;  Service: Cardiovascular;  Laterality: N/A;   SUBQ ICD IMPLANT N/A 06/27/2023   Procedure: SUBQ ICD IMPLANT;  Surgeon: Boyce Byes, MD;  Location: [facility] INVASIVE CV LAB;  Service: Cardiovascular;  Laterality: N/A;   Social History:  reports that she quit smoking about 3 years ago. Her smoking use included cigarettes. She has never used smokeless tobacco. She reports that she does not currently use alcohol. She reports that she does not currently use drugs after having used the following drugs: Cocaine.  Allergies  Allergen Reactions   Other Rash    Lemons     Family History  Problem Relation Age of Onset   Heart failure Mother        Onset of heart failure 40s.  Died in May 01, 2022.   Diabetes Mother    Hypertension Father    Diabetes Father     Prior to Admission medications   Medication Sig Start Date End Date Taking? Authorizing Provider  Accu-Chek Softclix Lancets lancets Use as instructed 11/18/22   Rockney Cid, DO  amLODipine  (NORVASC ) 10 MG tablet Take 1 tablet (10 mg total) by mouth daily. 09/05/23 12/04/23  Charlette Console, FNP  carvedilol  (COREG ) 12.5 MG tablet Take 1 tablet (12.5 mg total) by mouth 2 (two) times daily with a meal. 08/08/23   Darus Engels A, DO  Continuous Glucose Sensor (FREESTYLE LIBRE 3 SENSOR) MISC Place 1 sensor on the skin every 14 days. Use to check glucose continuously 01/14/23   Rockney Cid, DO  dapagliflozin  propanediol (FARXIGA ) 10 MG TABS tablet Take 1 tablet (10 mg total) by mouth once daily. 08/08/23   Montey Apa, DO  digoxin  (LANOXIN ) 0.125 MG tablet Take 0.5 tablets (0.0625 mg total) by mouth daily. 08/08/23   Montey Apa, DO  ondansetron  (ZOFRAN -ODT) 4 MG disintegrating tablet Take 1 tablet (4 mg total) by mouth every 8  (eight) hours as needed. 10/02/23   Arline Bennett, MD  sacubitril -valsartan  (ENTRESTO ) 97-103 MG Take 1 tablet by mouth 2 (two) times daily.    [provider]  Semaglutide ,0.25 or 0.5MG /DOS, (OZEMPIC , 0.25 OR 0.5 MG/DOSE,) 2 MG/3ML SOPN Inject 0.5 mg into the skin once a week. 10/04/23   Charlette Console, FNP  spironolactone  (ALDACTONE ) 25 MG tablet Take 1 tablet (25 mg total) by mouth daily. 08/08/23   Montey Apa, DO  torsemide  (DEMADEX ) 20 MG tablet Take 2 tablets (40 mg total) by mouth daily Patient not taking: Reported on 10/04/2023 08/08/23 09/07/23  Darus Engels A, DO  Potassium Chloride  40 MEQ/15ML (20%) SOLN Take 40 mEq by mouth 2 (two) times daily. Patient not taking: Reported on 06/07/2023 07/04/20 10/18/20  Constancia Delton, MD    Physical Exam: Vitals:   10/10/23 2200 10/10/23 2207 10/10/23 2208 10/11/23 0123  BP:   (!) 160/87 137/87  Pulse:   (!) 101 91  Resp:   18 20  Temp:   98.1 F (36.7 C) 99 F (37.2 C)  TempSrc:    Oral  SpO2: 96%  96% 99%  Weight:  99.3 kg    Height:  5\' 7"  (1.702 m)     Physical Exam Vitals and nursing note reviewed.  Constitutional:      General: She is not in acute distress. HENT:     Head: Normocephalic and atraumatic.  Cardiovascular:     Rate and Rhythm: Normal rate and regular rhythm.     Heart sounds: Normal heart sounds.  Pulmonary:     Effort: Pulmonary effort is normal.     Breath sounds: Normal breath sounds.  Abdominal:     Palpations: Abdomen is soft.     Tenderness: There is no abdominal tenderness.  Neurological:     Mental Status: Mental status is at baseline.     Labs on Admission: I have personally reviewed following labs and imaging studies  CBC: Recent Labs  Lab 10/10/23 2209  WBC 9.4  HGB 7.8*  HCT 26.0*  MCV 94.9  PLT 383   Basic Metabolic Panel: Recent Labs  Lab 10/10/23 2209  NA 140  K 4.3  CL 110  CO2 21*  GLUCOSE 144*  BUN 43*  CREATININE 1.51*  CALCIUM  7.7*   GFR: Estimated  Creatinine Clearance: 60.6 mL/min (A) (by C-G formula based on SCr of 1.51 mg/dL (H)). Liver Function Tests: No results for input(s): "AST", "ALT", "ALKPHOS", "BILITOT", "PROT", "ALBUMIN" in the last 168 hours. No results for input(s): "LIPASE", "AMYLASE" in the last 168 hours. No results for input(s): "AMMONIA" in the last 168 hours. Coagulation Profile: No results for input(s): "INR", "PROTIME" in the last 168 hours. Cardiac Enzymes: No results for input(s): "CKTOTAL", "CKMB", "CKMBINDEX", "TROPONINI" in the last  168 hours. BNP (last 3 results) No results for input(s): "PROBNP" in the last 8760 hours. HbA1C: No results for input(s): "HGBA1C" in the last 72 hours. CBG: No results for input(s): "GLUCAP" in the last 168 hours. Lipid Profile: No results for input(s): "CHOL", "HDL", "LDLCALC", "TRIG", "CHOLHDL", "LDLDIRECT" in the last 72 hours. Thyroid  Function Tests: No results for input(s): "TSH", "T4TOTAL", "FREET4", "T3FREE", "THYROIDAB" in the last 72 hours. Anemia Panel: No results for input(s): "VITAMINB12", "FOLATE", "FERRITIN", "TIBC", "IRON ", "RETICCTPCT" in the last 72 hours. Urine analysis:    Component Value Date/Time   COLORURINE YELLOW (A) 08/28/2023 1041   APPEARANCEUR HAZY (A) 08/28/2023 1041   LABSPEC 1.012 08/28/2023 1041   PHURINE 5.0 08/28/2023 1041   GLUCOSEU NEGATIVE 08/28/2023 1041   HGBUR MODERATE (A) 08/28/2023 1041   BILIRUBINUR NEGATIVE 08/28/2023 1041   KETONESUR NEGATIVE 08/28/2023 1041   PROTEINUR >=300 (A) 08/28/2023 1041   NITRITE NEGATIVE 08/28/2023 1041   LEUKOCYTESUR TRACE (A) 08/28/2023 1041    Radiological Exams on Admission: DG Chest 2 View Result Date: 10/10/2023 CLINICAL DATA:  Chest pain EXAM: CHEST - 2 VIEW COMPARISON:  Radiograph 10/02/2023 FINDINGS: Stable cardiomegaly interstitial coarsening in the mid and lower lungs with ground-glass opacities in the right lower lung. No pleural effusion or pneumothorax. Left ICD IMPRESSION: Findings  most suggestive of CHF with mild interstitial edema. Atypical infection could appear similarly. Electronically Signed   By: Rozell Cornet M.D.   On: 10/10/2023 22:43   Data Reviewed for HPI: Relevant notes from primary care and specialist visits, past discharge summaries as available in EHR, including Care Everywhere. Prior diagnostic testing as pertinent to current admission diagnoses Updated medications and problem lists for reconciliation ED course, including vitals, labs, imaging, treatment and response to treatment Triage notes, nursing and pharmacy notes and ED provider's notes Notable results as noted above in HPI      Assessment and Plan:  HFrEF (heart failure with reduced ejection fraction) (HCC) NICM s/p AICD-(EF 30 to 35% 08/07/2023) IV Lasix  Continue GDMT with Entresto , Aldactone , torsemide , digoxin , Coreg  and Farxiga --patient endorses having a hard time being compliant with her medication Daily weight with intake and output monitoring  Anemia Hemoglobin 7.8 down from baseline of near 10 Serial H&H and transfuse if needed   Type 2 diabetes mellitus without complications (HCC) - Sliding scale insulin  coverage   Essential hypertension - We will continue antihypertensive therapy.  CKD 3a -Renal function at baseline  DM Sliding scale coverage     DVT prophylaxis: scd  Consults: none  Advance Care Planning:   Code Status: Prior   Family Communication: none  Disposition Plan: Back to previous home environment  Severity of Illness: The appropriate patient status for this patient is OBSERVATION. Observation status is judged to be reasonable and necessary in order to provide the required intensity of service to ensure the patient's safety. The patient's presenting symptoms, physical exam findings, and initial radiographic and laboratory data in the context of their medical condition is felt to place them at decreased risk for further clinical deterioration.  Furthermore, it is anticipated that the patient will be medically stable for discharge from the hospital within 2 midnights of admission.   Author: Lanetta Pion, MD 10/11/2023 2:08 AM  For on call review [email or web address].

## 2023-10-11 NOTE — Discharge Instructions (Addendum)
Shelters Resource List  [employer] [facility] PROVIDED BY: [facility] [address] [place], [place], [place] Offers a faith-based shelter for homeless men, usually with substance use disorders. Residents receive counseling, life skills training, and help finding a job.  HOMELESS SHELTER PROVIDED BY: [facility]URCH[facility] OF [facility] [address] [place], [place], [place] Offers a shelter for men, women, and families experiencing homelessness. Food, clothing and other items are available for residents. Also offers support and services to help residents become self-sufficient. Offers temporary emergency housing for 30 days. Additional shelter may be available when temperatures drop below freezing but is not guaranteed.  FAMILY ABUSE SERVICES OF [facility] COUNTY PROVIDED BY: FAMILY ABUSE SERVICES OF [facility] [address], [place], [place] Offers services for victims of domestic violence. Offers a 24-hour crisis line and emergency shelter. Offers information and referrals to other community resources. Also offers court advocacy and support groups.  HOUSING CHOICE VOUCHER PROGRAM PROVIDED BY: [facility] [place] [address], [place], [place] Offers vouchers for approved Section 8 properties. Vouchers offer financial help with rent    [employer] PROVIDED BY: [facility] [facility] [facility], [place], [place] Offers emergency shelter for victims of domestic violence. Also offers a 24-hour crisis hotline for victims of domestic violence, safety planning, information and referrals, case management, and support groups for victims of domestic violence.   ACT TOGETHER EMERGENCY SHELTER PROVIDED BY: [facility] [address], [place], [place] Offers a 21-day emergency shelter for youth experiencing a family crisis, abuse, or homelessness. Case management, supportive services, healthcare services, and more are available for  residents. SHELTER PROVIDED BY: [facility] [address], [place], [place] Offers a homeless shelter for people and families. Meals, showers, community referrals, case management, and more are available for residents. HEARTH TRANSITIONAL LIVING PROGRAM PROVIDED BY: [facility] [address], [place], [place] Offers an 42-month homeless shelter for younger adults experiencing homelessness. Case management, independent living skills education, and more are available for residents. [facility] PROVIDED BY: [facility] [facility] [address], [place], [place] Offers transitional housing for families and single people experiencing homelessness. Residents meet regularly with a case manager to work towards self-sufficiency TRANSITIONAL HOUSING PROVIDED BY: [facility] [address], [place], [place] Offers transitional housing for female veterans with disabilities. Residents receive meals, transportation, and clothing. Also offers support groups, nutrition classes, and peer support to residents.   WE[facility] HOUSE PROVIDED BY: [place] [facility] [address], [place], [place] Offers shelter to adult men and women. Guests receive hot meals and case management. Also offers overnight shelter when temperatures drop during cold winter months  EMERGENCY FAMILY SHELTER PROVIDED BY: [facility] [address], [place], [place] Offers shelter and support services for families experiencing homelessness.

## 2023-10-12 ENCOUNTER — Telehealth (HOSPITAL_COMMUNITY): Payer: Self-pay | Admitting: Pharmacy Technician

## 2023-10-12 ENCOUNTER — Other Ambulatory Visit (HOSPITAL_COMMUNITY): Payer: Self-pay

## 2023-10-12 ENCOUNTER — Other Ambulatory Visit: Payer: Self-pay

## 2023-10-12 DIAGNOSIS — E1129 Type 2 diabetes mellitus with other diabetic kidney complication: Secondary | ICD-10-CM | POA: Diagnosis not present

## 2023-10-12 DIAGNOSIS — Z91148 Patient's other noncompliance with medication regimen for other reason: Secondary | ICD-10-CM

## 2023-10-12 DIAGNOSIS — I5023 Acute on chronic systolic (congestive) heart failure: Secondary | ICD-10-CM

## 2023-10-12 LAB — CBC
HCT: 26.4 % — ABNORMAL LOW (ref 36.0–46.0)
Hemoglobin: 7.9 g/dL — ABNORMAL LOW (ref 12.0–15.0)
MCH: 28.3 pg (ref 26.0–34.0)
MCHC: 29.9 g/dL — ABNORMAL LOW (ref 30.0–36.0)
MCV: 94.6 fL (ref 80.0–100.0)
Platelets: 371 10*3/uL (ref 150–400)
RBC: 2.79 MIL/uL — ABNORMAL LOW (ref 3.87–5.11)
RDW: 15.4 % (ref 11.5–15.5)
WBC: 7.2 10*3/uL (ref 4.0–10.5)
nRBC: 0 % (ref 0.0–0.2)

## 2023-10-12 LAB — BASIC METABOLIC PANEL WITH GFR
Anion gap: 9 (ref 5–15)
BUN: 37 mg/dL — ABNORMAL HIGH (ref 6–20)
CO2: 24 mmol/L (ref 22–32)
Calcium: 8.1 mg/dL — ABNORMAL LOW (ref 8.9–10.3)
Chloride: 103 mmol/L (ref 98–111)
Creatinine, Ser: 1.22 mg/dL — ABNORMAL HIGH (ref 0.44–1.00)
GFR, Estimated: 58 mL/min — ABNORMAL LOW (ref >60)
Glucose, Bld: 138 mg/dL — ABNORMAL HIGH (ref 70–99)
Potassium: 4.3 mmol/L (ref 3.5–5.1)
Sodium: 136 mmol/L (ref 135–145)

## 2023-10-12 LAB — GLUCOSE, CAPILLARY
Glucose-Capillary: 128 mg/dL — ABNORMAL HIGH (ref 70–99)
Glucose-Capillary: 171 mg/dL — ABNORMAL HIGH (ref 70–99)

## 2023-10-12 MED ORDER — CARVEDILOL 12.5 MG PO TABS
12.5000 mg | ORAL_TABLET | Freq: Two times a day (BID) | ORAL | 1 refills | Status: DC
  Filled 2023-10-12: qty 90, 45d supply, fill #0
  Filled 2023-11-22: qty 90, 45d supply, fill #1

## 2023-10-12 MED ORDER — DIGOXIN 125 MCG PO TABS
0.0625 mg | ORAL_TABLET | Freq: Every day | ORAL | 1 refills | Status: DC
  Filled 2023-10-12 (×2): qty 15, 30d supply, fill #0
  Filled 2023-11-22: qty 15, 30d supply, fill #1

## 2023-10-12 MED ORDER — ACCU-CHEK SOFTCLIX LANCETS MISC
0 refills | Status: DC
  Filled 2023-10-12: qty 100, 34d supply, fill #0

## 2023-10-12 MED ORDER — ACCU-CHEK GUIDE ME W/DEVICE KIT
PACK | 0 refills | Status: DC
  Filled 2023-10-12: qty 1, 30d supply, fill #0

## 2023-10-12 MED ORDER — SPIRONOLACTONE 25 MG PO TABS
25.0000 mg | ORAL_TABLET | Freq: Every day | ORAL | 1 refills | Status: DC
  Filled 2023-10-12: qty 30, 30d supply, fill #0
  Filled 2023-11-22: qty 30, 30d supply, fill #1

## 2023-10-12 MED ORDER — FERROUS SULFATE 325 (65 FE) MG PO TABS
325.0000 mg | ORAL_TABLET | Freq: Two times a day (BID) | ORAL | 3 refills | Status: DC
  Filled 2023-10-12: qty 60, 30d supply, fill #0

## 2023-10-12 MED ORDER — TORSEMIDE 20 MG PO TABS
20.0000 mg | ORAL_TABLET | Freq: Every day | ORAL | 1 refills | Status: DC
  Filled 2023-10-12: qty 30, 30d supply, fill #0

## 2023-10-12 MED ORDER — TORSEMIDE 20 MG PO TABS: 20.0000 mg | ORAL_TABLET | Freq: Every day | ORAL | Status: DC

## 2023-10-12 MED ORDER — ENTRESTO 97-103 MG PO TABS
1.0000 | ORAL_TABLET | Freq: Two times a day (BID) | ORAL | 1 refills | Status: DC
  Filled 2023-10-12: qty 60, 30d supply, fill #0
  Filled 2023-11-22: qty 60, 30d supply, fill #1

## 2023-10-12 MED ORDER — GLUCOSE BLOOD VI STRP
ORAL_STRIP | 0 refills | Status: DC
  Filled 2023-10-12: qty 100, 30d supply, fill #0

## 2023-10-12 MED ORDER — DAPAGLIFLOZIN PROPANEDIOL 10 MG PO TABS
10.0000 mg | ORAL_TABLET | Freq: Every day | ORAL | 1 refills | Status: DC
  Filled 2023-10-12: qty 30, 30d supply, fill #0
  Filled 2023-10-12: qty 90, 90d supply, fill #0
  Filled 2023-10-12: qty 30, 30d supply, fill #0

## 2023-10-12 MED ORDER — GLUCOSE BLOOD VI STRP
ORAL_STRIP | 0 refills | Status: DC
  Filled 2023-10-12: qty 100, 34d supply, fill #0

## 2023-10-12 NOTE — TOC Transition Note (Signed)
 Transition of Care [facility]) - Discharge Note   Patient Details  Name: Molly Howard MRN: [id number] Date of Birth: April 15, 1986  Transition of Care [facility]) CM/SW Contact:  Odilia Bennett, LCSW Phone Number: 10/12/2023, 12:43 PM   Clinical Narrative:  Patient has orders to discharge today. Medications will be delivered to the room shortly. Cab voucher faxed to [facility] with pickup time for 1:00. Placed on the front of the chart for her to take with her. No further concerns. CSW signing off.   Final next level of care: Home/Self Care Barriers to Discharge: Barriers Resolved   Patient Goals and CMS Choice            Discharge Placement                Patient to be transferred to facility by: Taxi   Patient and family notified of of transfer: 10/12/23  Discharge Plan and Services Additional resources added to the After Visit Summary for     Discharge Planning Services: CM Consult                                 Social Drivers of Health (SDOH) Interventions SDOH Screenings   Food Insecurity: Food Insecurity Present (10/11/2023)  Housing: High Risk (10/11/2023)  Transportation Needs: Unmet Transportation Needs (10/11/2023)  Utilities: Not At Risk (10/11/2023)  Alcohol Screen: Low Risk  (04/15/2023)  Depression (PHQ2-9): Low Risk  (03/02/2023)  Financial Resource Strain: High Risk (08/25/2023)  Social Connections: Unknown (08/28/2023)  Tobacco Use: Medium Risk (10/10/2023)     Readmission Risk Interventions    11/08/2022    3:23 PM 07/25/2022   10:35 AM 02/15/2022    2:34 PM  Readmission Risk Prevention Plan  Transportation Screening Complete Complete Complete  PCP or Specialist Appt within 3-5 Days  Complete Complete  Social Work Consult for Recovery Care Planning/Counseling  Complete Complete  Palliative Care Screening  Not Applicable Not Applicable  Medication Review [employer]) Complete Complete Complete  PCP or Specialist appointment within 3-5 days  of discharge Complete    SW Recovery Care/Counseling Consult Complete    Palliative Care Screening Not Applicable    Skilled Nursing Facility Not Applicable

## 2023-10-12 NOTE — Progress Notes (Signed)
 Rounding Note   Patient Name: Molly Howard Date of Encounter: 10/12/2023  [facility] Cardiologist: Constancia Delton, MD   Subjective Patient reports improvements in volume status.  She denies chest pain and shortness of breath.  Kidney function stable.  Scheduled Meds:  carvedilol   3.125 mg Oral BID WC   dapagliflozin  propanediol  10 mg Oral Daily   insulin  aspart  0-15 Units Subcutaneous TID WC   insulin  aspart  0-5 Units Subcutaneous QHS   sacubitril -valsartan   1 tablet Oral BID   spironolactone   25 mg Oral Daily   [START ON 10/13/2023] torsemide   20 mg Oral Daily   Continuous Infusions:  PRN Meds: acetaminophen  **OR** acetaminophen , albuterol , ondansetron  **OR** ondansetron  (ZOFRAN ) IV   Vital Signs  Vitals:   10/12/23 0500 10/12/23 0729 10/12/23 0730 10/12/23 1107  BP:   113/62 125/85  Pulse:  74 72 79  Resp:    20  Temp:   98.2 F (36.8 C)   TempSrc:      SpO2:  99% 96% 100%  Weight: 100 kg     Height:        Intake/Output Summary (Last 24 hours) at 10/12/2023 1123 Last data filed at 10/12/2023 1051 Gross per 24 hour  Intake 600 ml  Output 1900 ml  Net -1300 ml      10/12/2023    5:00 AM 10/10/2023   10:07 PM 10/04/2023    2:09 PM  Last 3 Weights  Weight (lbs) 220 lb 7.4 oz 219 lb 214 lb  Weight (kg) 100 kg 99.338 kg 97.07 kg      Telemetry Sinus rhythm with PVCs - Personally Reviewed  Physical Exam  GEN: No acute distress.   Neck: No JVD Cardiac: RRR, no murmurs, rubs, or gallops.  Respiratory: Clear to auscultation bilaterally. GI: Soft, nontender, non-distended  MS: No edema; No deformity. Neuro:  Nonfocal  Psych: Normal affect   Labs High Sensitivity Troponin:   Recent Labs  Lab 10/02/23 0813 10/02/23 1017 10/10/23 2209 10/11/23 0122  TROPONINIHS 19* 21* 16 18*     Chemistry Recent Labs  Lab 10/10/23 2209 10/11/23 0534 10/12/23 0356  NA 140 137 136  K 4.3 3.9 4.3  CL 110 108 103  CO2 21* 21* 24  GLUCOSE 144*  139* 138*  BUN 43* 40* 37*  CREATININE 1.51* 1.15* 1.22*  CALCIUM  7.7* 7.4* 8.1*  GFRNONAA 45* >60 58*  ANIONGAP 9 8 9     Lipids No results for input(s): CHOL, TRIG, HDL, LABVLDL, LDLCALC, CHOLHDL in the last 168 hours.  Hematology Recent Labs  Lab 10/10/23 2209 10/11/23 0534 10/12/23 0356  WBC 9.4 8.9 7.2  RBC 2.74* 2.59* 2.79*  HGB 7.8* 7.2* 7.9*  HCT 26.0* 24.4* 26.4*  MCV 94.9 94.2 94.6  MCH 28.5 27.8 28.3  MCHC 30.0 29.5* 29.9*  RDW 15.8* 15.7* 15.4  PLT 383 334 371   Thyroid  No results for input(s): TSH, FREET4 in the last 168 hours.  BNP Recent Labs  Lab 10/10/23 2209  BNP 934.8*    DDimer No results for input(s): DDIMER in the last 168 hours.   Radiology  US  PELVIS (TRANSABDOMINAL ONLY) Result Date: 10/11/2023 CLINICAL DATA:  Heavy vaginal bleeding EXAM: TRANSABDOMINAL ULTRASOUND OF PELVIS TECHNIQUE: Transabdominal ultrasound examination of the pelvis was performed including evaluation of the uterus, ovaries, adnexal regions, and pelvic cul-de-sac. COMPARISON:  CT 10/02/2023 FINDINGS: Uterus Measurements: 8.7 x 3.9 x 5.2 cm = volume: 92.5 mL. No fibroids or other mass visualized.  Endometrium Thickness: 4.7.  No focal abnormality visualized. Right ovary Measurements: 3.6 x 1.6 x 2.3 cm = volume: 7.2 mL. Normal appearance/no adnexal mass. Left ovary Measurements: 3.4 x 2.3 x 2.4 cm = volume: 9.9 mL. Normal appearance/no adnexal mass. Other findings:  No abnormal free fluid. IMPRESSION: Negative pelvic ultrasound. Electronically Signed   By: Esmeralda Hedge M.D.   On: 10/11/2023 17:07   DG Chest 2 View Result Date: 10/10/2023 CLINICAL DATA:  Chest pain EXAM: CHEST - 2 VIEW COMPARISON:  Radiograph 10/02/2023 FINDINGS: Stable cardiomegaly interstitial coarsening in the mid and lower lungs with ground-glass opacities in the right lower lung. No pleural effusion or pneumothorax. Left ICD IMPRESSION: Findings most suggestive of CHF with mild interstitial edema.  Atypical infection could appear similarly. Electronically Signed   By: Rozell Cornet M.D.   On: 10/10/2023 22:43    Cardiac Studies  08/07/2023 Echo complete 1. Left ventricular ejection fraction, by estimation, is 30 to 35%. The  left ventricle has moderate to severely decreased function. The left  ventricle demonstrates global hypokinesis. There is mild left ventricular  hypertrophy. Left ventricular  diastolic parameters are indeterminate.   2. Right ventricular systolic function is low normal. The right  ventricular size is normal.   3. The mitral valve is normal in structure. Mild mitral valve  regurgitation.   4. Tricuspid valve regurgitation is mild to moderate.   5. The aortic valve is tricuspid. Aortic valve regurgitation is not  visualized.   6. The inferior vena cava is normal in size with <50% respiratory  variability, suggesting right atrial pressure of 8 mmHg.  Patient Profile   38 y.o. female with a hx of combined systolic and diastolic CHF, stage II CKD, type 2 diabetes, hypertension, nonischemic cardiomyopathy, GERD, and polysubstance abuse admitted 6/9 for acute on chronic HFrEF.  Assessment & Plan   Acute on chronic systolic heart failure - Patient noncompliant with medications for past month with symptoms of shortness of breath, orthopnea, abdominal and lower extremity swelling - Most recent echo 08/07/2023 with EF 30 to 35% - Received IV diuresis yesterday with net -1.3 L output, although intake appears poorly recorded - Volume status appears to be improving - Recommend transition from IV Lasix  back to PTA torsemide  on discharge - She is continued on PTA Entresto , spironolactone , and Farxiga  - Continue carvedilol  at 3.125 mg twice daily  Chest pain - No further chest pain since admission - Troponin negative x 2 with no acute ischemic changes on EKG - Consider ischemic evaluation as outpatient  Hypertension -Blood pressure stable, continue antihypertensives  as above  Anemia - Hemoglobin 7.9 today, improved some from yesterday - Ongoing evaluation and management per IM  Substance use - UDS positive for cocaine - Reiterated importance of abstinence  CKD IIIa - Kidney function appears near baseline - Continue to avoid nephrotoxic agents  For questions or updates, please contact [facility] Please consult [email or web address] for contact info under     Signed, Brodie Cannon, PA-C  10/12/2023, 11:23 AM

## 2023-10-12 NOTE — Progress Notes (Signed)
 Heart Failure Stewardship Pharmacy Note  PCP: Rockney Cid, DO PCP-Cardiologist: Constancia Delton, MD  HPI: Molly Howard is a 38 y.o. female with combined systolic and diastolic CHF, stage II chronic kidney disease, type 2 diabetes mellitus, hypertension, and nonischemic cardiomyopathy, GERD as well as polysubstance abuse with multiple recent CHF admissions who presented with shortness of breath, abdominal distention, orthopnea, and intermittent chest pain. Has difficulty with medication adherence and appears to have been out of medications for ~1 month. She has been alternating between staying in a motel and a park since discharge. On admission, BNP 934.8, HS-troponin was 16, UDS pending. Chest x-ray noted to be consistent with CHF.  Patient reports that she is not likely to be adherent to her medication regimen until she has a stable living condition. She is currently working with a Clinical research associate for disability and with social work for housing, however, this has been a long process and is not guaranteed to yield results.   Pertinent Cardiac History: Noted to have CHF in 2021 where echo showed LVEF of 35-40%. Cardiac MRI in 05/2020 showed an EF of 42%, mild LV dilatation, RVEF 50%, with no delayed enhancement. Echo in 07/2022 with LVEF of 25 to 30%, mildly decreased RV systolic function, moderate to severe tricuspid regurgitation, and mild mitral regurgitation. RHC in 07/2022 with severely elevated RA pressure of 30, CO/CI low at 4.47/2, COP of 0.89, PAPI of 0.8,  and high PA pressures due to volume overload. Since, she has had multiple admissions for CHF. Admitted 02/09/22 due to acute on chronic HF. + cocaine. Admitted 07/24/22 due to decompensated HFrEF. Required milrinone  drip and diuresed from 116 kg >> 91 kg. Admitted 11/05/22 due to swelling and fluid retention in arms and legs along with some central chest tightness and shortness of breath. Required milrinone  for diuresis. Zio patch 12/2022 with 24 VT  runs with no sustained arrhythmias. Received subcutaneous ICD 06/2023. Echocardiogram 08/07/23 showed LVEF of 30-35%, mild LVH, low normal RV function, mild MR, mild-moderate TR, mild PR.   Pertinent Lab Values: Creat  Date Value Ref Range Status  12/14/2022 1.45 (H) 0.50 - 0.97 mg/dL Final   Creatinine, Ser  Date Value Ref Range Status  10/12/2023 1.22 (H) 0.44 - 1.00 mg/dL Final   BUN  Date Value Ref Range Status  10/12/2023 37 (H) 6 - 20 mg/dL Final  78/29/5621 15 6 - 20 mg/dL Final   Potassium  Date Value Ref Range Status  10/12/2023 4.3 3.5 - 5.1 mmol/L Final   Sodium  Date Value Ref Range Status  10/12/2023 136 135 - 145 mmol/L Final  06/13/2023 142 134 - 144 mmol/L Final   B Natriuretic Peptide  Date Value Ref Range Status  10/10/2023 934.8 (H) 0.0 - 100.0 pg/mL Final    Comment:    Performed at [facility], [address], [place], [place] [address]   Magnesium   Date Value Ref Range Status  10/02/2023 2.5 (H) 1.7 - 2.4 mg/dL Final    Comment:    Performed at [facility], [address] [place], [place] [address]   Hgb A1c MFr Bld  Date Value Ref Range Status  08/06/2023 6.7 (H) 4.8 - 5.6 % Final    Comment:    (NOTE)         Prediabetes: 5.7 - 6.4         Diabetes: >6.4         Glycemic control for adults with diabetes: <7.0  Digoxin  Level  Date Value Ref Range Status  04/20/2023 0.3 (L) 0.8 - 2.0 ng/mL Final    Comment:    Performed at [facility], [address] [place], [place] [address]   TSH  Date Value Ref Range Status  11/09/2021 5.114 (H) 0.350 - 4.500 uIU/mL Final    Comment:    Performed by a 3rd Generation assay with a functional sensitivity of <=0.01 uIU/mL. Performed at [facility], [address] [place], [place] [address]    LDH  Date Value Ref Range Status  04/15/2023 216 (H) 98 - 192 U/L Final    Comment:    Performed at [facility], [address] [place],  [place] [address]    Vital Signs:  Temp:  [97.7 F (36.5 C)-98.3 F (36.8 C)] 98.2 F (36.8 C) (06/11 0730) Pulse Rate:  [72-91] 72 (06/11 0730) Cardiac Rhythm: Normal sinus rhythm (06/10 1909) Resp:  [14-21] 20 (06/11 0433) BP: (113-140)/(62-116) 113/62 (06/11 0730) SpO2:  [95 %-100 %] 96 % (06/11 0730) Weight:  [100 kg (220 lb 7.4 oz)] 100 kg (220 lb 7.4 oz) (06/11 0500)  Intake/Output Summary (Last 24 hours) at 10/12/2023 1005 Last data filed at 10/12/2023 0848 Gross per 24 hour  Intake 240 ml  Output 1900 ml  Net -1660 ml    Current Heart Failure Medications:  Loop diuretic: torsemide  40 mg daily Beta-Blocker: carvedilol  12.5 mg BID  ACEI/ARB/ARNI: Entresto  97-103 mg BID MRA: spironolactone  25 mg daily SGLT2i: Farxiga  10 mg daily Other: digoxin  0.0625 mg daily  Prior to admission Heart Failure Medications:  Out of medications for ~1 month Loop diuretic: torsemide  40 mg daily Beta-Blocker: carvedilol  12.5 mg BID  ACEI/ARB/ARNI: Entresto  97-103 mg BID MRA: spironolactone  25 mg daily SGLT2i: Farxiga  10 mg daily Other: digoxin  0.0625 mg daily  Assessment: 1. Acute on chronic systolic heart failure (LVEF 30-35%) with low normal RV function, due to NICM. NYHA class II symptoms.  -Symptoms: Patient reports shortness of breath with exertion, orthopnea, abdominal distention, and LEE have all mildly improved. -Volume: Patient is hypervolemic. LEE is mild, however the patient has significant abdominal distention. Diuretics changed to oral torsemide . -Hemodynamics: BP elevated on admission, however patient was not taking home medications. HR 80-90s. -BB: Continue carvedilol  12.5 mg BID. Does not appear to be low output. -ACEI/ARB/ARNI: Continue Entresto  97/103 mg BID. -MRA: Continue spironolactone  25 mg daily -SGLT2i: Continue furosemide  40 mg IV BID  Plan: 1) Medication changes recommended at this time: -Agree with discharge med changes.   2) Patient assistance: -Copays are  $4  3) Education: - Patient has been educated on current HF medications and potential additions to HF medication regimen - Patient verbalizes understanding that over the next few months, these medication doses may change and more medications may be added to optimize HF regimen - Patient has been educated on basic disease state pathophysiology and goals of therapy - Patient reports that she is unlikely to take torsemide  and spironolactone  until she has a stable living environment. Discussed the benefits of GDMT and loop diuretics to maintain euvolemia with encouragement to take medications as prescribed.  Medication Assistance / Insurance Benefits Check: Does the patient have prescription insurance?    Type of insurance plan:  Does the patient qualify for medication assistance through manufacturers or grants? No  Outpatient Pharmacy: Prior to admission outpatient pharmacy: [facility]      Please do not hesitate to reach out with questions or concerns,  Bevely Brush, PharmD, CPP,  BCPS Heart Failure Pharmacist  Phone - [phone] 10/12/2023 10:05 AM

## 2023-10-12 NOTE — Plan of Care (Signed)
  Problem: Education: Goal: Ability to describe self-care measures that may prevent or decrease complications (Diabetes Survival Skills Education) will improve Outcome: Progressing   Problem: Coping: Goal: Ability to adjust to condition or change in health will improve Outcome: Progressing   Problem: Fluid Volume: Goal: Ability to maintain a balanced intake and output will improve Outcome: Progressing   Problem: Health Behavior/Discharge Planning: Goal: Ability to identify and utilize available resources and services will improve Outcome: Progressing Goal: Ability to manage health-related needs will improve Outcome: Progressing   Problem: Metabolic: Goal: Ability to maintain appropriate glucose levels will improve Outcome: Progressing   Problem: Nutritional: Goal: Maintenance of adequate nutrition will improve Outcome: Progressing Goal: Progress toward achieving an optimal weight will improve Outcome: Progressing   Problem: Education: Goal: Knowledge of General Education information will improve Description: Including pain rating scale, medication(s)/side effects and non-pharmacologic comfort measures Outcome: Progressing   Problem: Health Behavior/Discharge Planning: Goal: Ability to manage health-related needs will improve Outcome: Progressing   Problem: Clinical Measurements: Goal: Ability to maintain clinical measurements within normal limits will improve Outcome: Progressing Goal: Diagnostic test results will improve Outcome: Progressing Goal: Cardiovascular complication will be avoided Outcome: Progressing   Problem: Nutrition: Goal: Adequate nutrition will be maintained Outcome: Progressing   Problem: Coping: Goal: Level of anxiety will decrease Outcome: Progressing   Problem: Pain Managment: Goal: General experience of comfort will improve and/or be controlled Outcome: Progressing

## 2023-10-12 NOTE — Telephone Encounter (Signed)
 Pharmacy Patient Advocate Encounter  Received notification from [facility] MEDICAID that Prior Authorization for Farxiga  10MG  tablets has been APPROVED from 10/12/2023 to 10/11/2024. Ran test claim, Copay is $4.00. This test claim was processed through [facility]- copay amounts may vary at other pharmacies due to pharmacy/plan contracts, or as the patient moves through the different stages of their insurance plan.   PA #/Case ID/Reference #: [id number]

## 2023-10-12 NOTE — Telephone Encounter (Signed)
 Pharmacy Patient Advocate Encounter   Received notification from Inpatient Request that prior authorization for Farxiga  10MG  tablets is required/requested.   Insurance verification completed.   The patient is insured through [facility] MEDICAID .   Per test claim: PA required; PA submitted to above mentioned insurance via CoverMyMeds Key/confirmation #/EOC [id number] Status is pending

## 2023-10-12 NOTE — Progress Notes (Signed)
 Pt alert and oriented. VSS. Discharge instructions reviewed with pt. Pt verbalized understanding. Pt verbalized understanding of need to schedule/attend follow up appointments. Pt verbalized understanding of changes to medications. Prescriptions delivered to bedside. IV removed. IV site WNL. Pt taken to discharge lounge in wheelchair. Cab called for pt. Discharge lounge staff given cab voucher.

## 2023-10-12 NOTE — TOC Progression Note (Signed)
 Transition of Care [facility]) - Progression Note    Patient Details  Name: Molly Howard MRN: [id number] Date of Birth: 02/03/1986  Transition of Care [facility]) CM/SW Contact  Odilia [place], LCSW Phone Number: 10/12/2023, 10:24 AM  Clinical Narrative:   CSW confirmed address to [facility] with patient. Will fax cab voucher to [place] once medications are delivered to the room. Patient signed a rider waiver in May 2025.  Expected Discharge Plan: Home/Self Care (patient is homeless and lives in [place]) Barriers to Discharge: Continued Medical Work up  Expected Discharge Plan and Services   Discharge Planning Services: CM Consult   Living arrangements for the past 2 months: Homeless Expected Discharge Date: 10/12/23                                     Social Determinants of Health (SDOH) Interventions SDOH Screenings   Food Insecurity: Food Insecurity Present (10/11/2023)  Housing: High Risk (10/11/2023)  Transportation Needs: Unmet Transportation Needs (10/11/2023)  Utilities: Not At Risk (10/11/2023)  Alcohol Screen: Low Risk  (04/15/2023)  Depression (PHQ2-9): Low Risk  (03/02/2023)  Financial Resource Strain: High Risk (08/25/2023)  Social Connections: Unknown (08/28/2023)  Tobacco Use: Medium Risk (10/10/2023)    Readmission Risk Interventions    11/08/2022    3:23 PM 07/25/2022   10:35 AM 02/15/2022    2:34 PM  Readmission Risk Prevention Plan  Transportation Screening Complete Complete Complete  PCP or Specialist Appt within 3-5 Days  Complete Complete  Social Work Consult for Recovery Care Planning/Counseling  Complete Complete  Palliative Care Screening  Not Applicable Not Applicable  Medication Review Oceanographer) Complete Complete Complete  PCP or Specialist appointment within 3-5 days of discharge Complete    SW Recovery Care/Counseling Consult Complete    Palliative Care Screening Not Applicable    Skilled Nursing Facility Not Applicable

## 2023-10-12 NOTE — Discharge Summary (Signed)
 Physician Discharge Summary   Patient: Molly Howard MRN: [id number] DOB: 06-19-1985  Admit date:     10/11/2023  Discharge date: 10/12/23  Discharge Physician: Melvinia Stager   PCP: Rockney Cid, DO   Recommendations at discharge:    Refrain from using street drugs F/u PCP in 1-2 weeks Take your fluid pill (torsemide ) in the morning  Discharge Diagnoses: Principal Problem:   CHF (congestive heart failure) (HCC) Active Problems:   Symptomatic anemia    Molly Howard is a 38 y.o. female with medical history significant for Homelessness, CKD llla, type 2 diabetes mellitus, nonischemic cardiomyopathy last EF 30%, s/p  ICD 06/2023  essential hypertension,  polysubstance abuse , multiple recent admissions for chest pain/CHF/COPD being admitted with CHF exacerbation after presenting with shortness of breath and abdominal distention along with orthopnea and intermittent chest pain, BNP elevated to 938 up from 168 a couple weeks prior and chest x-ray consistent with CHF.    HFrEF (heart failure with reduced ejection fraction) (HCC) NICM s/p AICD-(EF 30 to 35% 08/07/2023) --IV Lasix --good uop >2 liters. Pt is not sob and sats 100% on ambulation --pt reports NOT taking her torsemide  due to [place] the park and using the [place]. She is non compliant with diet due to eating whatever she can get --advised to take torsemide  atleast once a day --Continue GDMT with Entresto , Aldactone , torsemide , digoxin , Coreg  and Farxiga - --patient endorses having a hard time being compliant with her medication --vitals stable  Cocaine abuse --pt advised refrain from street drugs   Anemia de to Menorrhagia --Hemoglobin 7.8 down from baseline of near 10 --will start pt on ferrous sulfate  --I have asked pt to f/u GYN dr Madelene Schanz (on call) --see if her sister can take her   Type 2 diabetes mellitus without complications (HCC) - Sliding scale insulin  coverage --cont farxiga    Essential  hypertension - continue antihypertensive therapy.   CKD 3a -Renal function at baseline   Overall at baseline. D/c home. Per TOC outpt living resources given.  Rx given thru [facility] pharmacy for 90 days       Consultants: none Diet recommendation:  Discharge Diet Orders (From admission, onward)     Start     Ordered   10/12/23 0000  Diet - low sodium heart healthy        10/12/23 0953            DISCHARGE MEDICATION: Allergies as of 10/12/2023       Reactions   Other Rash   Lemons         Medication List     STOP taking these medications    amLODipine  10 MG tablet Commonly known as: NORVASC    ondansetron  4 MG disintegrating tablet Commonly known as: ZOFRAN -ODT   Ozempic  (0.25 or 0.5 MG/DOSE) 2 MG/3ML Sopn Generic drug: Semaglutide (0.25 or 0.5MG /DOS)       TAKE these medications    Accu-Chek Softclix Lancets lancets Use as instructed   carvedilol  12.5 MG tablet Commonly known as: COREG  Take 1 tablet (12.5 mg total) by mouth 2 (two) times daily with a meal.   dapagliflozin  propanediol 10 MG Tabs tablet Commonly known as: FARXIGA  Take 1 tablet (10 mg total) by mouth once daily.   digoxin  0.125 MG tablet Commonly known as: LANOXIN  Take 0.5 tablets (0.0625 mg total) by mouth daily.   Entresto  97-103 MG Generic drug: sacubitril -valsartan  Take 1 tablet by mouth 2 (two) times daily.   ferrous sulfate  325 (65 FE)  MG EC tablet Take 1 tablet (325 mg total) by mouth 2 (two) times daily.   FreeStyle Libre 3 Sensor Misc Place 1 sensor on the skin every 14 days. Use to check glucose continuously   spironolactone  25 MG tablet Commonly known as: ALDACTONE  Take 1 tablet (25 mg total) by mouth daily.   torsemide  20 MG tablet Commonly known as: DEMADEX  Take 1 tablet (20 mg total) by mouth daily. What changed: how much to take        Follow-up Information     Rockney Cid, DO. Schedule an appointment as soon as possible for a visit.    Specialty: Internal Medicine Why: chf f/u Contact information: [address] [place] [address] [phone]                Discharge Exam: Cleavon Curls Weights   10/10/23 2207 10/12/23 0500  Weight: 99.3 kg 100 kg   Obese Resp--decreased in bases. No resp distress CVS--s1s2 normal, no murmur Neuro--non focal  Condition at discharge: fair  The results of significant diagnostics from this hospitalization (including imaging, microbiology, ancillary and laboratory) are listed below for reference.   Imaging Studies: US  PELVIS (TRANSABDOMINAL ONLY) Result Date: 10/11/2023 CLINICAL DATA:  Heavy vaginal bleeding EXAM: TRANSABDOMINAL ULTRASOUND OF PELVIS TECHNIQUE: Transabdominal ultrasound examination of the pelvis was performed including evaluation of the uterus, ovaries, adnexal regions, and pelvic cul-de-sac. COMPARISON:  CT 10/02/2023 FINDINGS: Uterus Measurements: 8.7 x 3.9 x 5.2 cm = volume: 92.5 mL. No fibroids or other mass visualized. Endometrium Thickness: 4.7.  No focal abnormality visualized. Right ovary Measurements: 3.6 x 1.6 x 2.3 cm = volume: 7.2 mL. Normal appearance/no adnexal mass. Left ovary Measurements: 3.4 x 2.3 x 2.4 cm = volume: 9.9 mL. Normal appearance/no adnexal mass. Other findings:  No abnormal free fluid. IMPRESSION: Negative pelvic ultrasound. Electronically Signed   By: Esmeralda Hedge M.D.   On: 10/11/2023 17:07   DG Chest 2 View Result Date: 10/10/2023 CLINICAL DATA:  Chest pain EXAM: CHEST - 2 VIEW COMPARISON:  Radiograph 10/02/2023 FINDINGS: Stable cardiomegaly interstitial coarsening in the mid and lower lungs with ground-glass opacities in the right lower lung. No pleural effusion or pneumothorax. Left ICD IMPRESSION: Findings most suggestive of CHF with mild interstitial edema. Atypical infection could appear similarly. Electronically Signed   By: Rozell Cornet M.D.   On: 10/10/2023 22:43   CT ABDOMEN PELVIS WO CONTRAST Result Date:  10/02/2023 CLINICAL DATA:  Nausea and vomiting EXAM: CT ABDOMEN AND PELVIS WITHOUT CONTRAST TECHNIQUE: Multidetector CT imaging of the abdomen and pelvis was performed following the standard protocol without IV contrast. RADIATION DOSE REDUCTION: This exam was performed according to the departmental dose-optimization program which includes automated exposure control, adjustment of the mA and/or kV according to patient size and/or use of iterative reconstruction technique. COMPARISON:  08/07/2023 FINDINGS: Lower chest: Lung bases are free of acute infiltrate or sizable effusion. Minimal scarring is seen. Small 3-4 mm nodule is noted in the right lower lobe medially stable from the prior exam as well as an exam dating back to 2021 consistent with a benign etiology. Hepatobiliary: No focal liver abnormality is seen. Status post cholecystectomy. No biliary dilatation. Pancreas: Unremarkable. No pancreatic ductal dilatation or surrounding inflammatory changes. Spleen: Normal in size without focal abnormality. Adrenals/Urinary Tract: Adrenal glands are within normal limits. Kidneys demonstrate no renal calculi or obstructive changes. The bladder is partially distended. Stomach/Bowel: The appendix is within normal limits. No obstructive or inflammatory changes of colon are  seen. Small bowel and stomach are within normal limits. Vascular/Lymphatic: Aortic atherosclerosis. No enlarged abdominal or pelvic lymph nodes. Reproductive: Uterus and bilateral adnexa are unremarkable. Other: No abdominal wall hernia or abnormality. No abdominopelvic ascites. Musculoskeletal: No acute or significant osseous findings. IMPRESSION: No acute abnormality noted. Small right lower lobe nodule stable from 2021 consistent with a benign etiology. No follow-up is recommended. Electronically Signed   By: Violeta Grey M.D.   On: 10/02/2023 10:06   DG Chest Portable 1 View Result Date: 10/02/2023 CLINICAL DATA:  hx CHF. vague acute illness EXAM:  PORTABLE CHEST - 1 VIEW COMPARISON:  August 28, 2023 FINDINGS: Low lung volumes. No focal airspace consolidation, pleural effusion, or pneumothorax. Unchanged cardiomegaly. Left chest wall subcutaneous AICD with a single lead overlying the midline lower chest. No acute fracture or destructive lesion. IMPRESSION: Cardiomegaly.  Otherwise, no acute cardiopulmonary abnormality. Electronically Signed   By: Rance Burrows M.D.   On: 10/02/2023 08:46    Microbiology: Results for orders placed or performed during the hospital encounter of 10/02/23  Resp panel by RT-PCR (RSV, Flu A&B, Covid)     Status: None   Collection Time: 10/02/23  8:13 AM  Result Value Ref Range Status   SARS Coronavirus 2 by RT PCR NEGATIVE NEGATIVE Final    Comment: (NOTE) SARS-CoV-2 target nucleic acids are NOT DETECTED.  The SARS-CoV-2 RNA is generally detectable in upper respiratory specimens during the acute phase of infection. The lowest concentration of SARS-CoV-2 viral copies this assay can detect is 138 copies/mL. A negative result does not preclude SARS-Cov-2 infection and should not be used as the sole basis for treatment or other patient management decisions. A negative result may occur with  improper specimen collection/handling, submission of specimen other than nasopharyngeal swab, presence of viral mutation(s) within the areas targeted by this assay, and inadequate number of viral copies(<138 copies/mL). A negative result must be combined with clinical observations, patient history, and epidemiological information. The expected result is Negative.  Fact Sheet for Patients:  BloggerCourse.com  Fact Sheet for Healthcare Providers:  SeriousBroker.it  This test is no t yet approved or cleared by the [place]  FDA and  has been authorized for detection and/or diagnosis of SARS-CoV-2 by FDA under an Emergency Use Authorization (EUA). This EUA will remain   in effect (meaning this test can be used) for the duration of the COVID-19 declaration under Section 564(b)(1) of the Act, 21 U.S.C.section 360bbb-3(b)(1), unless the authorization is terminated  or revoked sooner.       Influenza A by PCR NEGATIVE NEGATIVE Final   Influenza B by PCR NEGATIVE NEGATIVE Final    Comment: (NOTE) The Xpert Xpress SARS-CoV-2/FLU/RSV plus assay is intended as an aid in the diagnosis of influenza from Nasopharyngeal swab specimens and should not be used as a sole basis for treatment. Nasal washings and aspirates are unacceptable for Xpert Xpress SARS-CoV-2/FLU/RSV testing.  Fact Sheet for Patients: BloggerCourse.com  Fact Sheet for Healthcare Providers: SeriousBroker.it  This test is not yet approved or cleared by the [place]  FDA and has been authorized for detection and/or diagnosis of SARS-CoV-2 by FDA under an Emergency Use Authorization (EUA). This EUA will remain in effect (meaning this test can be used) for the duration of the COVID-19 declaration under Section 564(b)(1) of the Act, 21 U.S.C. section 360bbb-3(b)(1), unless the authorization is terminated or revoked.     Resp Syncytial Virus by PCR NEGATIVE NEGATIVE Final    Comment: (NOTE) Fact Sheet for  Patients: BloggerCourse.com  Fact Sheet for Healthcare Providers: SeriousBroker.it  This test is not yet approved or cleared by the [place]  FDA and has been authorized for detection and/or diagnosis of SARS-CoV-2 by FDA under an Emergency Use Authorization (EUA). This EUA will remain in effect (meaning this test can be used) for the duration of the COVID-19 declaration under Section 564(b)(1) of the Act, 21 U.S.C. section 360bbb-3(b)(1), unless the authorization is terminated or revoked.  Performed at [facility], [address] [place], [place] [address]      Labs: CBC: Recent Labs  Lab 10/10/23 2209 10/11/23 0534 10/12/23 0356  WBC 9.4 8.9 7.2  HGB 7.8* 7.2* 7.9*  HCT 26.0* 24.4* 26.4*  MCV 94.9 94.2 94.6  PLT 383 334 371   Basic Metabolic Panel: Recent Labs  Lab 10/10/23 2209 10/11/23 0534 10/12/23 0356  NA 140 137 136  K 4.3 3.9 4.3  CL 110 108 103  CO2 21* 21* 24  GLUCOSE 144* 139* 138*  BUN 43* 40* 37*  CREATININE 1.51* 1.15* 1.22*  CALCIUM  7.7* 7.4* 8.1*    CBG: Recent Labs  Lab 10/11/23 0742 10/11/23 1143 10/11/23 1658 10/11/23 2221 10/12/23 0732  GLUCAP 116* 200* 113* 182* 128*    Discharge time spent: greater than 30 minutes.  Signed: Melvinia Stager, MD [facility] 10/12/2023

## 2023-10-13 ENCOUNTER — Telehealth: Payer: Self-pay

## 2023-10-13 NOTE — Transitions of Care (Post Inpatient/ED Visit) (Signed)
   10/13/2023  Name: Molly Howard MRN: [id number] DOB: 01/30/1986  Today's TOC FU Call Status: Today's TOC FU Call Status:: Unsuccessful Call (1st Attempt) Unsuccessful Call (1st Attempt) Date: 10/13/23  Attempted to reach the patient regarding the most recent Inpatient/ED visit.  Follow Up Plan: Additional outreach attempts will be made to reach the patient to complete the Transitions of Care (Post Inpatient/ED visit) call.   Signature Darrall Ellison, LPN [facility] Nurse Health Advisor Direct Dial [phone]

## 2023-10-14 ENCOUNTER — Emergency Department
Admission: EM | Admit: 2023-10-14 | Discharge: 2023-10-14 | Disposition: A | Discharge status: Home / Self Care | Attending: Emergency Medicine | Admitting: Emergency Medicine

## 2023-10-14 ENCOUNTER — Ambulatory Visit
Admission: EM | Admit: 2023-10-14 | Discharge: 2023-10-14 | Disposition: A | Discharge status: Home / Self Care | Source: Ambulatory Visit | Attending: Emergency Medicine | Admitting: Emergency Medicine

## 2023-10-14 ENCOUNTER — Other Ambulatory Visit: Payer: Self-pay

## 2023-10-14 DIAGNOSIS — T7421XA Adult sexual abuse, confirmed, initial encounter: Secondary | ICD-10-CM | POA: Diagnosis present

## 2023-10-14 DIAGNOSIS — Z0441 Encounter for examination and observation following alleged adult rape: Secondary | ICD-10-CM | POA: Diagnosis present

## 2023-10-14 LAB — RPR: RPR Ser Ql: NONREACTIVE

## 2023-10-14 LAB — COMPREHENSIVE METABOLIC PANEL WITH GFR
ALT: 16 U/L (ref 0–44)
AST: 18 U/L (ref 15–41)
Albumin: 2.5 g/dL — ABNORMAL LOW (ref 3.5–5.0)
Alkaline Phosphatase: 80 U/L (ref 38–126)
Anion gap: 8 (ref 5–15)
BUN: 46 mg/dL — ABNORMAL HIGH (ref 6–20)
CO2: 25 mmol/L (ref 22–32)
Calcium: 7.5 mg/dL — ABNORMAL LOW (ref 8.9–10.3)
Chloride: 104 mmol/L (ref 98–111)
Creatinine, Ser: 1.53 mg/dL — ABNORMAL HIGH (ref 0.44–1.00)
GFR, Estimated: 44 mL/min — ABNORMAL LOW (ref >60)
Glucose, Bld: 136 mg/dL — ABNORMAL HIGH (ref 70–99)
Potassium: 3.6 mmol/L (ref 3.5–5.1)
Sodium: 137 mmol/L (ref 135–145)
Total Bilirubin: 0.6 mg/dL (ref 0.0–1.2)
Total Protein: 6.6 g/dL (ref 6.5–8.1)

## 2023-10-14 LAB — HEPATITIS C ANTIBODY: HCV Ab: NONREACTIVE

## 2023-10-14 LAB — RAPID HIV SCREEN (HIV 1/2 AB+AG)
HIV 1/2 Antibodies: NONREACTIVE
HIV-1 P24 Antigen - HIV24: NONREACTIVE

## 2023-10-14 LAB — HEPATITIS B SURFACE ANTIGEN: Hepatitis B Surface Ag: NONREACTIVE

## 2023-10-14 LAB — HCG, QUANTITATIVE, PREGNANCY: hCG, Beta Chain, Quant, S: 1 m[IU]/mL (ref <5)

## 2023-10-14 MED ORDER — BICTEGRAVIR-EMTRICITAB-TENOFOV 50-200-25 MG PO TABS
1.0000 | ORAL_TABLET | Freq: Every day | ORAL | Status: DC
  Administered 2023-10-14: 1 via ORAL
  Filled 2023-10-14: qty 1

## 2023-10-14 MED ORDER — ULIPRISTAL ACETATE 30 MG PO TABS
30.0000 mg | ORAL_TABLET | Freq: Once | ORAL | Status: AC
  Administered 2023-10-14: 30 mg via ORAL
  Filled 2023-10-14: qty 1

## 2023-10-14 MED ORDER — AZITHROMYCIN 500 MG PO TABS
1000.0000 mg | ORAL_TABLET | Freq: Once | ORAL | Status: AC
  Administered 2023-10-14: 1000 mg via ORAL
  Filled 2023-10-14: qty 2

## 2023-10-14 MED ORDER — BIKTARVY 50-200-25 MG PO TABS
1.0000 | ORAL_TABLET | Freq: Every day | ORAL | 0 refills | Status: AC
  Filled 2023-10-14: qty 30, 30d supply, fill #0

## 2023-10-14 MED ORDER — CEFTRIAXONE SODIUM 1 G IJ SOLR
500.0000 mg | Freq: Once | INTRAMUSCULAR | Status: AC
  Filled 2023-10-14: qty 10

## 2023-10-14 MED ORDER — METRONIDAZOLE 500 MG PO TABS
2000.0000 mg | ORAL_TABLET | Freq: Once | ORAL | Status: AC
  Administered 2023-10-14: 2000 mg via ORAL
  Filled 2023-10-14: qty 4

## 2023-10-14 MED ORDER — LIDOCAINE HCL (PF) 1 % IJ SOLN
1.0000 mL | Freq: Once | INTRAMUSCULAR | Status: AC
  Administered 2023-10-14: 2.1 mL
  Filled 2023-10-14: qty 5

## 2023-10-14 NOTE — ED Triage Notes (Signed)
 BIB EMS C[place] with [place]. C/O pain in the rectum and noted some bleeding. Denies any other injuries.

## 2023-10-14 NOTE — ED Notes (Signed)
 Patient provided with a warm blankets at bedside.

## 2023-10-14 NOTE — ED Notes (Signed)
Awaiting meds from pharmacy prior to discharge
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
## 2023-10-14 NOTE — Transitions of Care (Post Inpatient/ED Visit) (Unsigned)
   10/14/2023  Name: Molly Howard MRN: [id number] DOB: [birth year]  Today's TOC FU Call Status: Today's TOC FU Call Status:: Unsuccessful Call (2nd Attempt) Unsuccessful Call (1st Attempt) Date: 10/13/23 Unsuccessful Call (2nd Attempt) Date: 10/14/23  Attempted to reach the patient regarding the most recent Inpatient/ED visit.  Follow Up Plan: Additional outreach attempts will be made to reach the patient to complete the Transitions of Care (Post Inpatient/ED visit) call.   Signature Darrall Ellison, LPN [facility] Nurse Health Advisor Direct Dial [phone]

## 2023-10-14 NOTE — ED Notes (Addendum)
 Case #: [id number]. Provided by [place] [employer].
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
## 2023-10-14 NOTE — ED Notes (Signed)
SANE nurse contacted at this time.
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
## 2023-10-14 NOTE — ED Notes (Signed)
 Patient requested Lorita Rosa to be contacted at this time. This RN attempted to call, but had to leave a voicemail due to the friend not answering. Patient made aware at this time.
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
## 2023-10-17 NOTE — Transitions of Care (Post Inpatient/ED Visit) (Signed)
   10/17/2023  Name: Molly Howard MRN: [id number] DOB: Dec 22, 1985  Today's TOC FU Call Status: Today's TOC FU Call Status:: Unsuccessful Call (3rd Attempt) Unsuccessful Call (1st Attempt) Date: 10/13/23 Unsuccessful Call (2nd Attempt) Date: 10/14/23 Unsuccessful Call (3rd Attempt) Date: 10/17/23  Attempted to reach the patient regarding the most recent Inpatient/ED visit.  Follow Up Plan: No further outreach attempts will be made at this time. We have been unable to contact the patient.  Signature Darrall Ellison, LPN [facility] Nurse Health Advisor Direct Dial [phone]

## 2023-10-17 NOTE — ED Notes (Signed)
The SANE/FNE (Forensic Nurse Examiner) consult has been completed. The primary RN and/or provider have been notified. Please contact the SANE/FNE nurse on call (listed in Amion) with any further concerns.  
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
## 2023-10-17 NOTE — SANE Note (Signed)
 [facility] was contacted for patient.   Ladonna Pickup on site with patient. [facility] will provide the patient with a hotel room tonight and then assist in finding a shelter. The patient has complex medical needs and at increased risk for physical complication when homeless.   Due to the patient's lack of address and vehicle a prescription for the entire course of Biktarvy was sent to pharmacy. Initially Lonzell Robin noted she would take the patient to the pharmacy but later the [facility] pharmacy staff reported they would deliver the medication to the ED so the patient would have all the needed medication upon discharge.

## 2023-10-18 IMAGING — CR DG SHOULDER 2+V*L*
1 series · 3 of 3 positions shown · non-contrast
Comparison: None.

CLINICAL DATA: Left shoulder injury a year ago playing football.
Worsening pain over the last week.

EXAM:
LEFT SHOULDER - 2+ VIEW

[Series 1: dg shoulder left · 0.14mm/px · 3 of 3 slices shown]
[im 1/3]
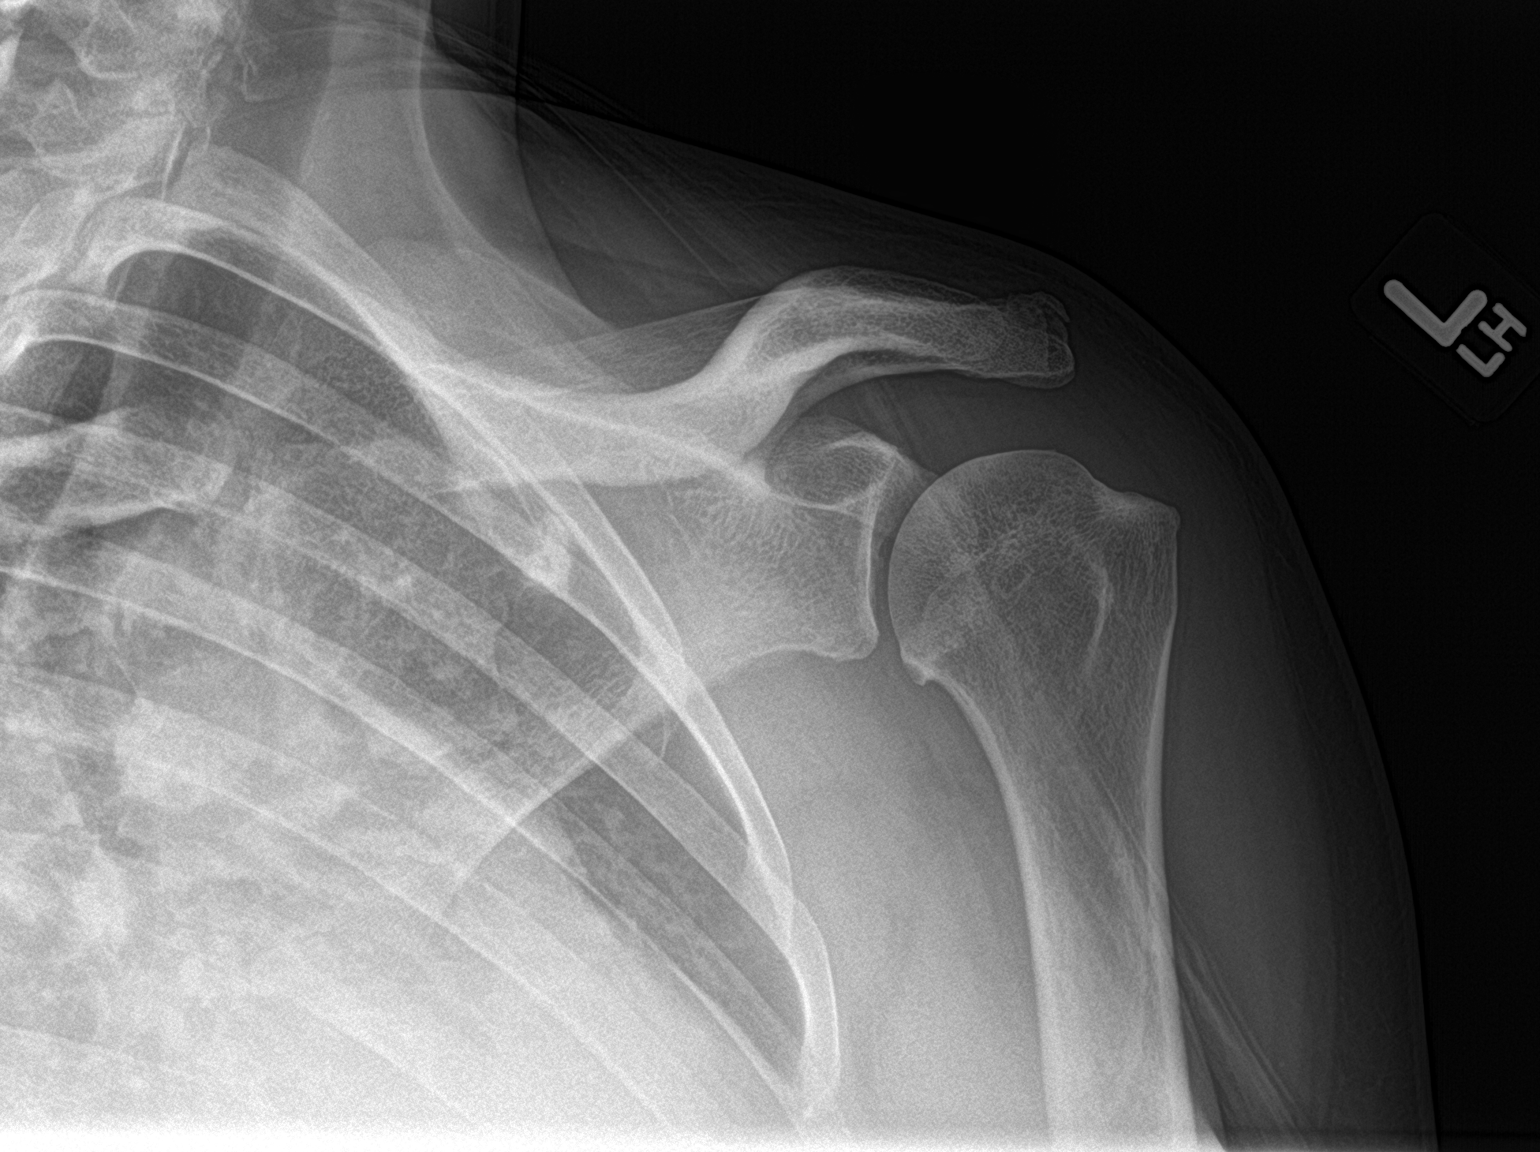
[im 2/3]
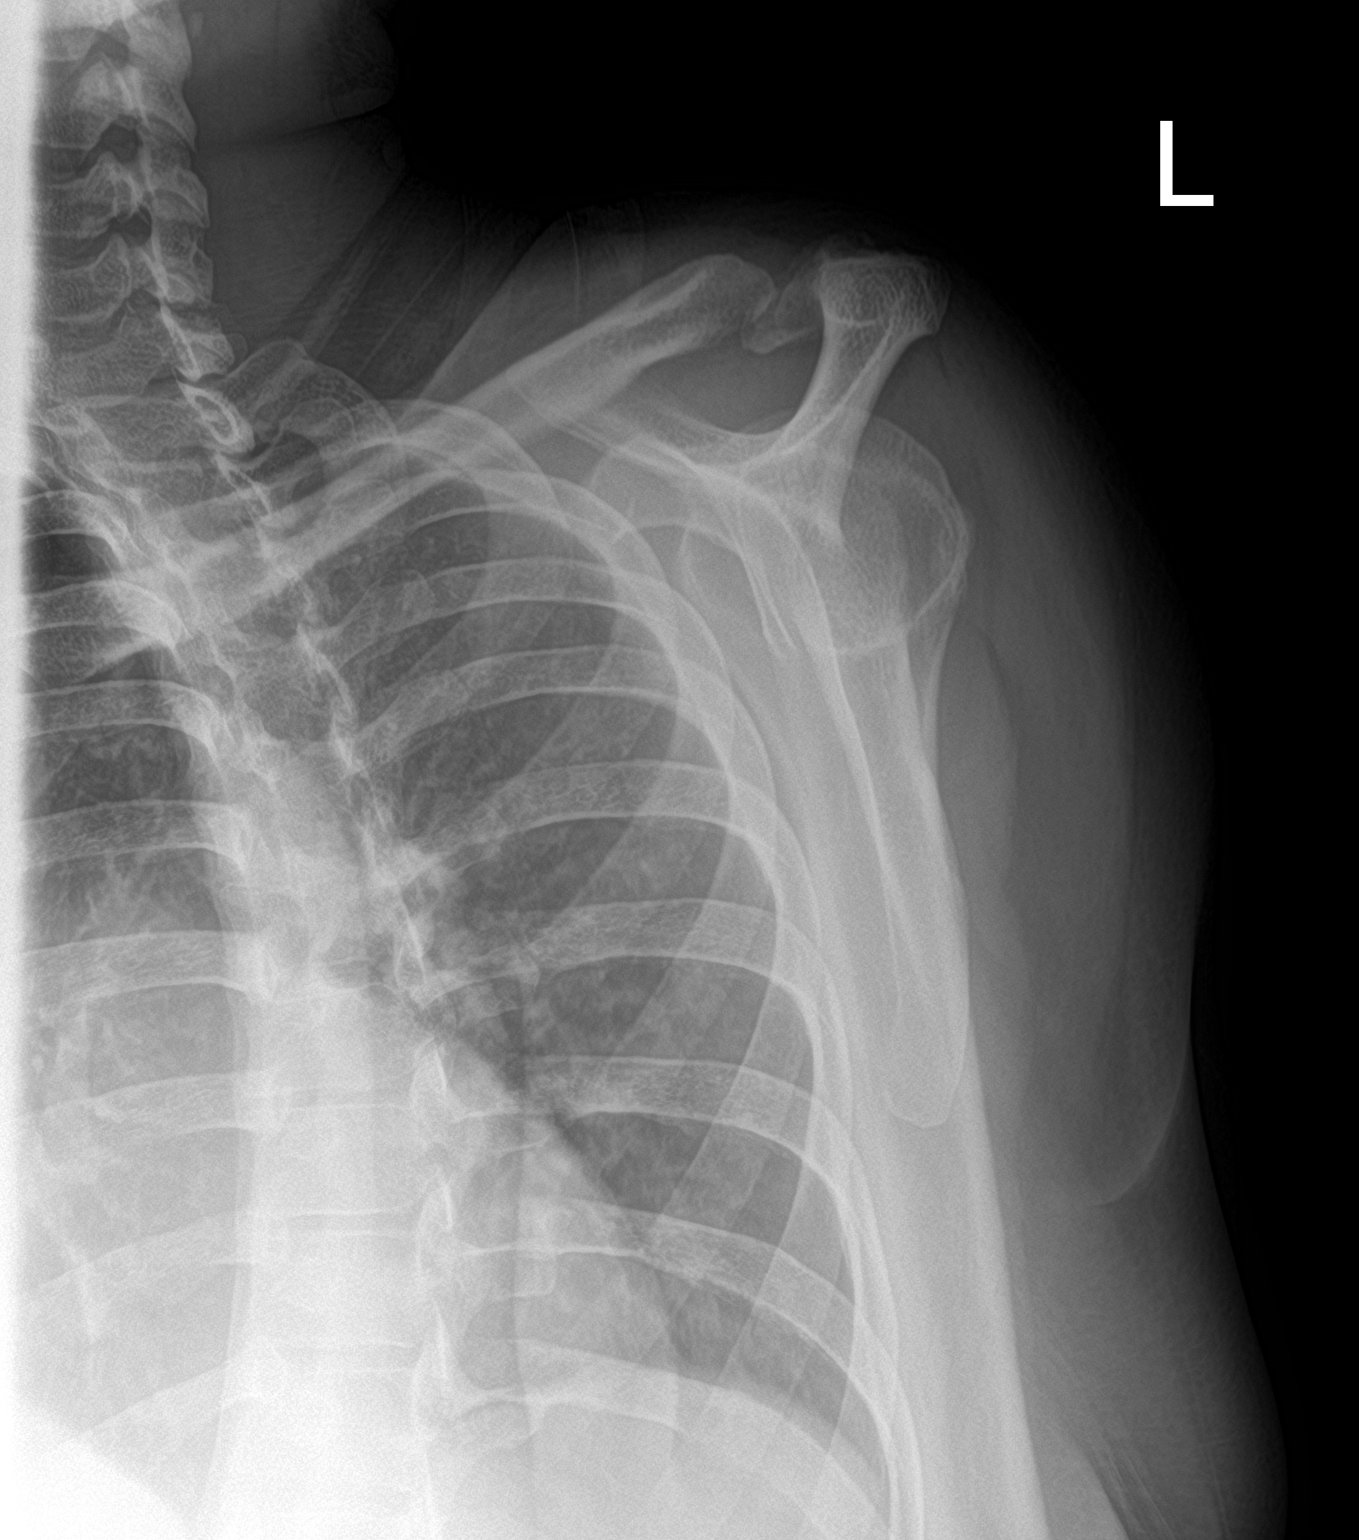
[im 3/3]
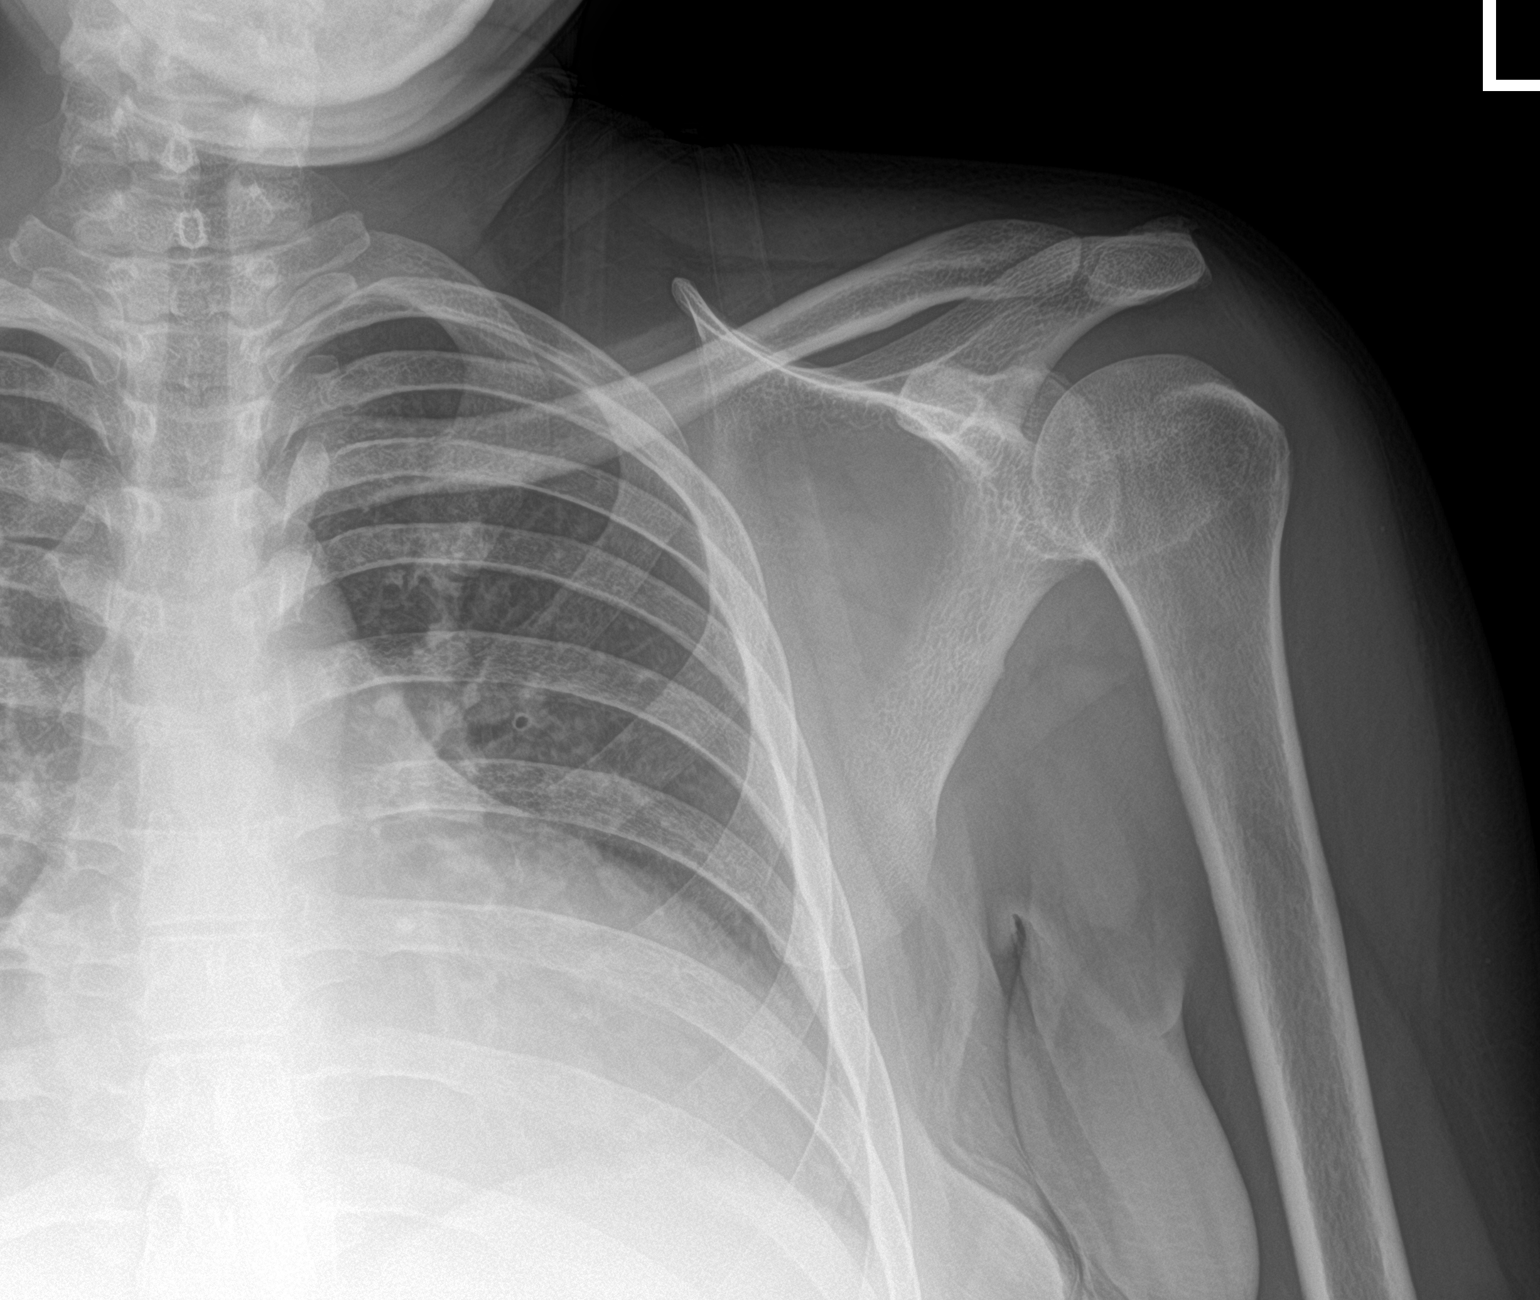

[3 of 3 positions shown; findings below may reference images not displayed]

FINDINGS: No evidence for an acute fracture. No subluxation or dislocation. No
shoulder separation. Mild degenerative spurring noted on the humeral
head.
IMPRESSION: No acute bony abnormality.

## 2023-10-20 ENCOUNTER — Telehealth: Payer: Self-pay | Admitting: Family

## 2023-11-02 ENCOUNTER — Telehealth: Payer: Self-pay | Admitting: Cardiology

## 2023-11-02 NOTE — Telephone Encounter (Signed)
 Called to confirm/remind patient of their appointment at the [facility] on 11/03/23.   Appointment:   [x] Confirmed  [] Left mess   [] No answer/No voice mail  [] VM Full/unable to leave message  [] Phone not in service  Patient reminded to bring all medications and/or complete list.  Confirmed patient has transportation. Gave directions, instructed to utilize valet parking.

## 2023-11-03 ENCOUNTER — Encounter: Admitting: Cardiology
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
## 2023-11-19 ENCOUNTER — Other Ambulatory Visit: Payer: Self-pay

## 2023-11-19 ENCOUNTER — Encounter: Payer: Self-pay | Admitting: Emergency Medicine

## 2023-11-19 ENCOUNTER — Emergency Department

## 2023-11-19 ENCOUNTER — Emergency Department
Admission: EM | Admit: 2023-11-19 | Discharge: 2023-11-20 | Disposition: A | Discharge status: Home / Self Care | Attending: Emergency Medicine | Admitting: Emergency Medicine

## 2023-11-19 DIAGNOSIS — E119 Type 2 diabetes mellitus without complications: Secondary | ICD-10-CM | POA: Insufficient documentation

## 2023-11-19 DIAGNOSIS — I5023 Acute on chronic systolic (congestive) heart failure: Secondary | ICD-10-CM | POA: Diagnosis not present

## 2023-11-19 DIAGNOSIS — Z95810 Presence of automatic (implantable) cardiac defibrillator: Secondary | ICD-10-CM | POA: Diagnosis not present

## 2023-11-19 DIAGNOSIS — N183 Chronic kidney disease, stage 3 unspecified: Secondary | ICD-10-CM | POA: Insufficient documentation

## 2023-11-19 DIAGNOSIS — M7989 Other specified soft tissue disorders: Secondary | ICD-10-CM | POA: Diagnosis not present

## 2023-11-19 DIAGNOSIS — R0602 Shortness of breath: Secondary | ICD-10-CM | POA: Diagnosis present

## 2023-11-19 LAB — CBC
HCT: 35.2 % — ABNORMAL LOW (ref 36.0–46.0)
Hemoglobin: 10.3 g/dL — ABNORMAL LOW (ref 12.0–15.0)
MCH: 27.5 pg (ref 26.0–34.0)
MCHC: 29.3 g/dL — ABNORMAL LOW (ref 30.0–36.0)
MCV: 94.1 fL (ref 80.0–100.0)
Platelets: 306 K/uL (ref 150–400)
RBC: 3.74 MIL/uL — ABNORMAL LOW (ref 3.87–5.11)
RDW: 20.8 % — ABNORMAL HIGH (ref 11.5–15.5)
WBC: 7 K/uL (ref 4.0–10.5)
nRBC: 0 % (ref 0.0–0.2)

## 2023-11-19 LAB — BASIC METABOLIC PANEL WITH GFR
Anion gap: 9 (ref 5–15)
BUN: 36 mg/dL — ABNORMAL HIGH (ref 6–20)
CO2: 22 mmol/L (ref 22–32)
Calcium: 8 mg/dL — ABNORMAL LOW (ref 8.9–10.3)
Chloride: 107 mmol/L (ref 98–111)
Creatinine, Ser: 1.58 mg/dL — ABNORMAL HIGH (ref 0.44–1.00)
GFR, Estimated: 42 mL/min — ABNORMAL LOW (ref >60)
Glucose, Bld: 268 mg/dL — ABNORMAL HIGH (ref 70–99)
Potassium: 4.8 mmol/L (ref 3.5–5.1)
Sodium: 138 mmol/L (ref 135–145)

## 2023-11-19 LAB — BRAIN NATRIURETIC PEPTIDE: B Natriuretic Peptide: 1443.1 pg/mL — ABNORMAL HIGH (ref 0.0–100.0)

## 2023-11-19 LAB — TROPONIN I (HIGH SENSITIVITY): Troponin I (High Sensitivity): 12 ng/L (ref <18)

## 2023-11-19 MED ORDER — FUROSEMIDE 10 MG/ML IJ SOLN
60.0000 mg | Freq: Once | INTRAMUSCULAR | Status: AC
  Administered 2023-11-19: 60 mg via INTRAVENOUS
  Filled 2023-11-19: qty 8

## 2023-11-19 MED ORDER — TORSEMIDE 20 MG PO TABS
20.0000 mg | ORAL_TABLET | Freq: Every day | ORAL | 2 refills | Status: DC
  Filled 2023-11-19: qty 30, 30d supply, fill #0

## 2023-11-19 NOTE — ED Provider Notes (Signed)
 [facility] Provider Note    Event Date/Time   First MD Initiated Contact with Patient 11/19/23 2158     (approximate)   History   Shortness of Breath and Leg Swelling   HPI  Molly Howard is a 38 y.o. female who presents to the ED for evaluation of Shortness of Breath and Leg Swelling   I review medical DC summary from 6/11.  Homeless patient with CKD 3, DM and nonischemic cardiomyopathy with EF 30%, ICD in place and polysubstance abuse.  She reports running out of her torsemide  in the past 3 days, feeling more swollen, short of breath  Physical Exam   Triage Vital Signs: ED Triage Vitals  Encounter Vitals Group     BP 11/19/23 2153 (!) 152/118     Girls Systolic BP Percentile --      Girls Diastolic BP Percentile --      Boys Systolic BP Percentile --      Boys Diastolic BP Percentile --      Pulse Rate 11/19/23 2153 71     Resp 11/19/23 2153 18     Temp 11/19/23 2153 98 F (36.7 C)     Temp Source 11/19/23 2153 Oral     SpO2 11/19/23 2153 100 %     Weight 11/19/23 2156 232 lb 14.4 oz (105.6 kg)     Height 11/19/23 2154 5' 7 (1.702 m)     Head Circumference --      Peak Flow --      Pain Score --      Pain Loc --      Pain Education --      Exclude from Growth Chart --     Most recent vital signs: Vitals:   11/19/23 2153 11/19/23 2204  BP: (!) 152/118 (!) 150/93  Pulse: 71 71  Resp: 18 (!) 27  Temp: 98 F (36.7 C)   SpO2: 100% 100%    General: Awake, no distress.  CV:  Good peripheral perfusion.  Resp:  Normal effort.  Abd:  No distention.  MSK:  No deformity noted.  Lower extremity edema is present bilaterally Neuro:  No focal deficits appreciated. Other:     ED Results / Procedures / Treatments   Labs (all labs ordered are listed, but only abnormal results are displayed) Labs Reviewed  CBC - Abnormal; Notable for the following components:      Result Value   RBC 3.74 (*)    Hemoglobin 10.3 (*)    HCT 35.2 (*)     MCHC 29.3 (*)    RDW 20.8 (*)    All other components within normal limits  BRAIN NATRIURETIC PEPTIDE - Abnormal; Notable for the following components:   B Natriuretic Peptide 1,443.1 (*)    All other components within normal limits  BASIC METABOLIC PANEL WITH GFR  POC URINE PREG, ED  TROPONIN I (HIGH SENSITIVITY)    EKG Sinus rhythm with a rate of 71 bpm.  Normal axis and intervals without clear signs of acute ischemia.  RADIOLOGY 2 view CXR interpreted by me with cardiomegaly and pulmonary vascular congestion  Official radiology report(s): DG Chest 2 View Result Date: 11/19/2023 EXAM: 2 VIEW(S) XRAY OF THE CHEST 11/19/2023 10:44:00 PM COMPARISON: 10/10/2023 CXR CLINICAL HISTORY: shob. Arrived pov, ambulatory to triage for bilateral LE and abd swelling, and shob x3 days. I am out of my fluid pills and am filling with fluid. Hx of chf, and pace maker. Respirations  even and unlabored, able to speak in complete sentences. FINDINGS: LUNGS AND PLEURA: Stable pulmonary vascular congestion without frank interstitial edema. No focal pulmonary opacity. No pleural effusion. No pneumothorax. HEART AND MEDIASTINUM: Stable mild cardiomegaly. Stable trans-sternal pacemaker. No acute abnormality of the cardiac and mediastinal silhouettes. BONES AND SOFT TISSUES: No acute osseous abnormality. IMPRESSION: 1. Cardiomegaly with pulmonary vascular congestion. No frank interstitial edema. Electronically signed by: Pinkie Pebbles MD 11/19/2023 10:48 PM EDT RP Workstation: [id number]    PROCEDURES and INTERVENTIONS:  .1-3 Lead EKG Interpretation  Performed by: Claudene Rover, MD Authorized by: Claudene Rover, MD     Interpretation: normal     ECG rate:  70   ECG rate assessment: normal     Rhythm: sinus rhythm     Ectopy: none     Conduction: normal     Medications  furosemide  (LASIX ) injection 60 mg (60 mg Intravenous Given 11/19/23 2242)     IMPRESSION / MDM / ASSESSMENT AND PLAN / ED COURSE   I reviewed the triage vital signs and the nursing notes.  Differential diagnosis includes, but is not limited to, ACS, PTX, PNA, muscle strain/spasm, PE, dissection, anxiety, pleural effusion  {Patient presents with symptoms of an acute illness or injury that is potentially life-threatening.  Patient with known severely reduced EF presents with evidence of CHF exacerbation after running out of her torsemide  over the past few days.  Looks volume overloaded but well.  No distress or hypoxia.  Elevated BNP congested CXR.  Normal WBC without infiltrate.  Awaiting metabolic panel.  Initiate IV diuresis.  She may be suitable for outpatient management pending repeat troponin, diuresis, assessment of renal function considering how well she looks .       FINAL CLINICAL IMPRESSION(S) / ED DIAGNOSES   Final diagnoses:  Acute on chronic systolic congestive heart failure (HCC)     Rx / DC Orders   ED Discharge Orders     None        Note:  This document was prepared using Dragon voice recognition software and may include unintentional dictation errors.   Claudene Rover, MD 11/19/23 [phone]

## 2023-11-19 NOTE — ED Triage Notes (Addendum)
 Arrived pov, ambulatory to triage for bilateral LE and abd swelling, and shob x3 days.   I am out of my fluid pills and am filling with fluid.  Hx of chf, and pace maker.  Respirations even and unlabored, able to speak in complete sentences

## 2023-11-20 ENCOUNTER — Other Ambulatory Visit: Payer: Self-pay

## 2023-11-20 NOTE — ED Provider Notes (Signed)
  Physical Exam  BP (!) 150/93   Pulse 71   Temp 98 F (36.7 C) (Oral)   Resp (!) 27   Ht 5' 7 (1.702 m)   Wt 105.6 kg   SpO2 100%   BMI 36.48 kg/m   Physical Exam  Procedures  Procedures  ED Course / MDM    Medical Decision Making Amount and/or Complexity of Data Reviewed Labs: ordered. Radiology: ordered.  Risk Prescription drug management.   Received patient in signout.  38 year old female presenting today for shortness of breath and leg swelling with known history of HFrEF with EF 30%.  Evaluated by initial provider and overall well-appearing with no hypoxia on room air.  No overwhelming tachypnea.  Most consistent with CHF exacerbation.  Congestion seen on chest x-ray with elevated BNP.  Plan was for IV diuresis and suspected discharge.  Patient has been able to urinate multiple times here and is continued to be well-appearing from a breathing standpoint.  BMP largely consistent with her baseline.  Will discharge with ongoing outpatient diuresis.     Malvina Alm DASEN, MD 11/20/23 [phone]

## 2023-11-20 NOTE — Discharge Instructions (Signed)
 Refill of your torsemide  has been sent today.  You will have to reach out to your primary care doctor for refill of the Ozempic .  Please return for any worsening shortness of breath.

## 2023-11-21 ENCOUNTER — Observation Stay
Admission: EM | Admit: 2023-11-21 | Discharge: 2023-11-22 | Disposition: A | Discharge status: Home / Self Care | Attending: Emergency Medicine | Admitting: Emergency Medicine

## 2023-11-21 ENCOUNTER — Emergency Department

## 2023-11-21 ENCOUNTER — Other Ambulatory Visit: Payer: Self-pay

## 2023-11-21 DIAGNOSIS — Z79899 Other long term (current) drug therapy: Secondary | ICD-10-CM | POA: Insufficient documentation

## 2023-11-21 DIAGNOSIS — N1831 Chronic kidney disease, stage 3a: Secondary | ICD-10-CM | POA: Diagnosis not present

## 2023-11-21 DIAGNOSIS — E1122 Type 2 diabetes mellitus with diabetic chronic kidney disease: Secondary | ICD-10-CM | POA: Diagnosis not present

## 2023-11-21 DIAGNOSIS — Z95810 Presence of automatic (implantable) cardiac defibrillator: Secondary | ICD-10-CM | POA: Insufficient documentation

## 2023-11-21 DIAGNOSIS — I5043 Acute on chronic combined systolic (congestive) and diastolic (congestive) heart failure: Principal | ICD-10-CM | POA: Diagnosis present

## 2023-11-21 DIAGNOSIS — I509 Heart failure, unspecified: Secondary | ICD-10-CM | POA: Diagnosis not present

## 2023-11-21 DIAGNOSIS — Z9119 Patient's noncompliance with other medical treatment and regimen due to financial hardship: Secondary | ICD-10-CM | POA: Diagnosis not present

## 2023-11-21 DIAGNOSIS — Z87891 Personal history of nicotine dependence: Secondary | ICD-10-CM | POA: Diagnosis not present

## 2023-11-21 DIAGNOSIS — Z8659 Personal history of other mental and behavioral disorders: Secondary | ICD-10-CM | POA: Insufficient documentation

## 2023-11-21 DIAGNOSIS — I130 Hypertensive heart and chronic kidney disease with heart failure and stage 1 through stage 4 chronic kidney disease, or unspecified chronic kidney disease: Secondary | ICD-10-CM | POA: Insufficient documentation

## 2023-11-21 DIAGNOSIS — R0602 Shortness of breath: Secondary | ICD-10-CM | POA: Diagnosis present

## 2023-11-21 DIAGNOSIS — I5023 Acute on chronic systolic (congestive) heart failure: Principal | ICD-10-CM

## 2023-11-21 LAB — CBC
HCT: 35.8 % — ABNORMAL LOW (ref 36.0–46.0)
Hemoglobin: 10.6 g/dL — ABNORMAL LOW (ref 12.0–15.0)
MCH: 27.7 pg (ref 26.0–34.0)
MCHC: 29.6 g/dL — ABNORMAL LOW (ref 30.0–36.0)
MCV: 93.5 fL (ref 80.0–100.0)
Platelets: 342 K/uL (ref 150–400)
RBC: 3.83 MIL/uL — ABNORMAL LOW (ref 3.87–5.11)
RDW: 20.6 % — ABNORMAL HIGH (ref 11.5–15.5)
WBC: 6.7 K/uL (ref 4.0–10.5)
nRBC: 0 % (ref 0.0–0.2)

## 2023-11-21 LAB — BRAIN NATRIURETIC PEPTIDE: B Natriuretic Peptide: 2061.7 pg/mL — ABNORMAL HIGH (ref 0.0–100.0)

## 2023-11-21 LAB — URINE DRUG SCREEN, QUALITATIVE (ARMC ONLY)
Amphetamines, Ur Screen: NOT DETECTED
Barbiturates, Ur Screen: NOT DETECTED
Benzodiazepine, Ur Scrn: NOT DETECTED
Cannabinoid 50 Ng, Ur ~~LOC~~: NOT DETECTED
Cocaine Metabolite,Ur ~~LOC~~: NOT DETECTED
MDMA (Ecstasy)Ur Screen: NOT DETECTED
Methadone Scn, Ur: NOT DETECTED
Opiate, Ur Screen: NOT DETECTED
Phencyclidine (PCP) Ur S: NOT DETECTED
Tricyclic, Ur Screen: NOT DETECTED

## 2023-11-21 LAB — BASIC METABOLIC PANEL WITH GFR
Anion gap: 9 (ref 5–15)
BUN: 33 mg/dL — ABNORMAL HIGH (ref 6–20)
CO2: 23 mmol/L (ref 22–32)
Calcium: 8.4 mg/dL — ABNORMAL LOW (ref 8.9–10.3)
Chloride: 104 mmol/L (ref 98–111)
Creatinine, Ser: 1.42 mg/dL — ABNORMAL HIGH (ref 0.44–1.00)
GFR, Estimated: 48 mL/min — ABNORMAL LOW (ref >60)
Glucose, Bld: 149 mg/dL — ABNORMAL HIGH (ref 70–99)
Potassium: 5 mmol/L (ref 3.5–5.1)
Sodium: 136 mmol/L (ref 135–145)

## 2023-11-21 LAB — TROPONIN I (HIGH SENSITIVITY): Troponin I (High Sensitivity): 12 ng/L (ref <18)

## 2023-11-21 LAB — POC URINE PREG, ED: Preg Test, Ur: NEGATIVE

## 2023-11-21 MED ORDER — ONDANSETRON HCL 4 MG/2ML IJ SOLN: 4.0000 mg | Freq: Four times a day (QID) | INTRAMUSCULAR | Status: DC | PRN

## 2023-11-21 MED ORDER — SODIUM CHLORIDE 0.9% FLUSH: 3.0000 mL | INTRAVENOUS | Status: DC | PRN

## 2023-11-21 MED ORDER — SACUBITRIL-VALSARTAN 97-103 MG PO TABS
1.0000 | ORAL_TABLET | Freq: Two times a day (BID) | ORAL | Status: DC
  Administered 2023-11-21 – 2023-11-22 (×2): 1 via ORAL
  Filled 2023-11-21 (×2): qty 1

## 2023-11-21 MED ORDER — CARVEDILOL 6.25 MG PO TABS
12.5000 mg | ORAL_TABLET | Freq: Two times a day (BID) | ORAL | Status: DC
  Administered 2023-11-21 – 2023-11-22 (×2): 12.5 mg via ORAL
  Filled 2023-11-21 (×2): qty 2

## 2023-11-21 MED ORDER — ENOXAPARIN SODIUM 60 MG/0.6ML IJ SOSY
55.0000 mg | PREFILLED_SYRINGE | INTRAMUSCULAR | Status: DC
  Filled 2023-11-21: qty 0.6

## 2023-11-21 MED ORDER — DAPAGLIFLOZIN PROPANEDIOL 10 MG PO TABS
10.0000 mg | ORAL_TABLET | Freq: Every day | ORAL | Status: DC
  Administered 2023-11-22: 10 mg via ORAL
  Filled 2023-11-21: qty 1

## 2023-11-21 MED ORDER — DIGOXIN 125 MCG PO TABS
0.0625 mg | ORAL_TABLET | Freq: Every day | ORAL | Status: DC
  Administered 2023-11-22: 0.0625 mg via ORAL
  Filled 2023-11-21: qty 0.5

## 2023-11-21 MED ORDER — ACETAMINOPHEN 325 MG PO TABS
650.0000 mg | ORAL_TABLET | ORAL | Status: DC | PRN
  Administered 2023-11-21 – 2023-11-22 (×2): 650 mg via ORAL
  Filled 2023-11-21 (×2): qty 2

## 2023-11-21 MED ORDER — SPIRONOLACTONE 25 MG PO TABS
25.0000 mg | ORAL_TABLET | Freq: Every day | ORAL | Status: DC
  Administered 2023-11-22: 25 mg via ORAL
  Filled 2023-11-21: qty 1

## 2023-11-21 MED ORDER — SODIUM CHLORIDE 0.9 % IV SOLN: 250.0000 mL | INTRAVENOUS | Status: DC | PRN

## 2023-11-21 MED ORDER — FUROSEMIDE 10 MG/ML IJ SOLN
40.0000 mg | Freq: Two times a day (BID) | INTRAMUSCULAR | Status: DC
  Administered 2023-11-21 – 2023-11-22 (×2): 40 mg via INTRAVENOUS
  Filled 2023-11-21 (×2): qty 4

## 2023-11-21 MED ORDER — SODIUM CHLORIDE 0.9% FLUSH
3.0000 mL | Freq: Two times a day (BID) | INTRAVENOUS | Status: DC
  Administered 2023-11-21 – 2023-11-22 (×2): 3 mL via INTRAVENOUS

## 2023-11-21 MED ORDER — FUROSEMIDE 10 MG/ML IJ SOLN
40.0000 mg | Freq: Once | INTRAMUSCULAR | Status: AC
  Administered 2023-11-21: 40 mg via INTRAVENOUS
  Filled 2023-11-21: qty 4

## 2023-11-21 NOTE — TOC Initial Note (Signed)
 Transition of Care [facility]) - Initial/Assessment Note    Patient Details  Name: Molly Howard MRN: [id number] Date of Birth: 08-22-1985  [facility] of [facility] CM/SW Contact:    Edsel DELENA Fischer, LCSW Phone Number: 11/21/2023, 6:22 PM  Clinical Narrative:                  SW met with pt at bedside.  Per pt report:  Pt stated that she had fluid, shoulder pain and heart condition.  Pt stated that she lives at shelter [address].  Safety/ DV concerns: No.  PCP:  Almarie Fischer at [facility]  Pharmacy: [facility].  Pt will need assistance with transportation once discharged.  HH/ DME: no.  Social Support: no.  ADLs/IADLs: No.  Incontinence issues: no.  Dialysis: no CPAP/ Oxygen: no.  Medication adherence: pt stated that she does take medication as directed by has problems at times affording the copay.  Pt has medicaid and stated that sometimes she has to pay $4 for each prescription and at times does not have the money. Pt stated that she will be starting job soon working at [employer] (group home).  Pt stated that she does not have income.  Applied for Disability and denied.  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   Pt gave verbal permission for SW to refer her to community programs.  SW reached out to [employer], Museum/gallery curator and [employer]. Pt contact number is [phone].      Barriers to Discharge: Continued Medical Work up   Patient Goals and CMS Choice            Expected Discharge Plan and Services In-house Referral: Clinical Social Work     Living arrangements for the past 2 months: Homeless Shelter                                      Prior Living Arrangements/Services Living arrangements for the past 2 months: Homeless Shelter     Do you feel safe going back to the place where you live?: Yes      Need for Family Participation in Patient Care: No (Comment) Care giver support system in place?: No (comment)   Criminal Activity/Legal Involvement  Pertinent to Current Situation/Hospitalization: No - Comment as needed  Activities of Daily Living      Permission Sought/Granted                  Emotional Assessment Appearance:: Appears stated age Attitude/Demeanor/Rapport: Engaged Affect (typically observed): Appropriate     Psych Involvement: No (comment)  Admission diagnosis:  CHF (congestive heart failure) (HCC) [I50.9] Patient Active Problem List   Diagnosis Date Noted   History of medication noncompliance 10/12/2023   CHF (congestive heart failure) (HCC) 10/11/2023   Class 1 obesity 08/31/2023   Hyponatremia 08/31/2023   Acute on chronic HFrEF (heart failure with reduced ejection fraction) (HCC) 08/28/2023   COPD exacerbation (HCC) 08/28/2023   Acute kidney injury superimposed on CKD (HCC) 08/15/2023   Chest pain 08/07/2023   Type 2 diabetes mellitus without complications (HCC) 08/07/2023   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   Primary hypertension 04/16/2023   Controlled type 2 diabetes mellitus without complication, without long-term current use of insulin  (HCC) 04/15/2023   Cardiogenic shock (HCC) 11/14/2022   Bilateral leg edema 11/06/2022   NICM (nonischemic cardiomyopathy) (HCC) 07/31/2022   Hypocalcemia 07/25/2022   Leg  edema 07/25/2022   Acute on chronic combined systolic and diastolic CHF (congestive heart failure) (HCC) 07/25/2022   GERD without esophagitis 07/24/2022   Cocaine abuse (HCC) 02/12/2022   Acute pulmonary edema (HCC)    Acute systolic heart failure (HCC) 02/09/2022   Syncope 02/09/2022   Menorrhagia with regular cycle 06/22/2021   Dyslipidemia 11/27/2020   Type II diabetes mellitus with renal manifestations (HCC) 11/27/2020   Chronic kidney disease (CKD), stage II (mild) 11/27/2020   Hypomagnesemia 10/17/2020   Uncontrolled type 2 diabetes mellitus with hyperglycemia, without long-term current use of insulin  (HCC) 10/11/2020   Dyspnea 05/21/2020    Essential hypertension 05/20/2020   Diabetes mellitus, type II (HCC) 05/20/2020   Chronic anemia 05/20/2020   Reactive thrombocytosis 05/20/2020   Obesity (BMI 30-39.9) 05/20/2020   Chronic combined systolic and diastolic heart failure (HCC) 11/01/2019   Financial difficulties 11/01/2019   Medication management 11/01/2019   Lung nodule 11/01/2019   Nonischemic cardiomyopathy (HCC) 11/01/2019   Polysubstance abuse (HCC) 11/01/2019   Tobacco use 11/01/2019   HFrEF (heart failure with reduced ejection fraction) (HCC)    Swelling    Anemia 10/02/2019   PCP:  [name], DO Pharmacy:   [facility] [address] [place] [address] Phone: [phone] Fax: [phone]     Social Drivers of Health (SDOH) Social History: SDOH Screenings   Food Insecurity: Food Insecurity Present (10/11/2023)  Housing: High Risk (10/11/2023)  Transportation Needs: Unmet Transportation Needs (10/11/2023)  Utilities: Not At Risk (10/11/2023)  Alcohol Screen: Low Risk  (04/15/2023)  Depression (PHQ2-9): Low Risk  (03/02/2023)  Financial Resource Strain: High Risk (08/25/2023)  Social Connections: Unknown (08/28/2023)  Tobacco Use: Medium Risk (11/21/2023)   SDOH Interventions:     Readmission Risk Interventions    11/08/2022    3:23 PM 07/25/2022   10:35 AM 02/15/2022    2:34 PM  Readmission Risk Prevention Plan  Transportation Screening Complete Complete Complete  PCP or Specialist Appt within 3-5 Days  Complete Complete  Social Work Consult for Recovery Care Planning/Counseling  Complete Complete  Palliative Care Screening  Not Applicable Not Applicable  Medication Review Oceanographer) Complete Complete Complete  PCP or Specialist appointment within 3-5 days of discharge Complete    SW Recovery Care/Counseling Consult Complete    Palliative Care Screening Not Applicable    Skilled Nursing Facility Not Applicable

## 2023-11-21 NOTE — ED Provider Notes (Signed)
 [facility] Provider Note    Event Date/Time   First MD Initiated Contact with Patient 11/21/23 1154     (approximate)   History   Chief Complaint: No chief complaint on file.   HPI  Molly Howard is a 38 y.o. female with a history of hypertension, diabetes, CKD, heart failure who comes ED complaining of shortness of breath, orthopnea, dyspnea on exertion, worsening for the past week.  She reports she is gained 15 pounds during this time.  Has been out of her torsemide .  Was seen in the ED 2 days ago, provided a refill for her torsemide  but was unable to get to the pharmacy to pick it up.  Over the last 36 hours since leaving the ED, symptoms have continued worsening.  Reports associated chest pain and increased peripheral edema.        Past Medical History:  Diagnosis Date   Acid reflux    Chronic HFrEF (heart failure with reduced ejection fraction) (HCC)    a. 10/2019 Echo: EF 35-40%, GrII DD; b. 05/2020 Echo: EF 20-25%, glob HK; c. 10/2020 Echo: EF 30-35%, glob HK. GrII DD, Mildly red RV fxn. Mod TR; d.  05/2021 cMRI: EF 42%, no LGE. Nl RV size/fxn.   CKD (chronic kidney disease), stage II    Diabetes mellitus (HCC)    H/O medication noncompliance    Hypertension    Microcytic anemia    NICM (nonischemic cardiomyopathy) (HCC)    a. 10/2019 Echo: EF 35-40%; b. 10/2019 MV: No ischemia. Small apical defect-->breast attenuation; c. 05/2020 Echo: EF 20-25%; d. 10/2020 Echo: EF 30-35%, glob HK. GrII DD, Mildly red RV fxn. Mod TR; e. 05/2021 cMRI: EF 42%, no LGE. Nl RV size/fxn.   Obesity    Polysubstance abuse [facility])     Current Outpatient Rx   Order #: [id number] Class: Normal   Order #: [id number] Class: Normal   Order #: [id number] Class: Normal   Order #: [id number] Class: Normal   Order #: [id number] Class: Normal   Order #: [id number] Class: Normal   Order #: [id number] Class: Normal   Order #: [id number] Class: Normal   Order #: [id number] Class: Normal   Order #:  [id number] Class: Normal   Order #: [id number] Class: Normal   Order #: [id number] Class: Normal   Order #: [id number] Class: Normal    Past Surgical History:  Procedure Laterality Date   CHOLECYSTECTOMY     HERNIA REPAIR     ICD IMPLANT     RIGHT HEART CATH N/A 07/28/2022   Procedure: RIGHT HEART CATH;  Surgeon: Darron Deatrice LABOR, MD;  Location: [facility] INVASIVE CV LAB;  Service: Cardiovascular;  Laterality: N/A;   SUBQ ICD IMPLANT N/A 06/27/2023   Procedure: SUBQ ICD IMPLANT;  Surgeon: Cindie Ole DASEN, MD;  Location: [facility] INVASIVE CV LAB;  Service: Cardiovascular;  Laterality: N/A;    Physical Exam   Triage Vital Signs: ED Triage Vitals  Encounter Vitals Group     BP 11/21/23 1124 (!) 163/107     Girls Systolic BP Percentile --      Girls Diastolic BP Percentile --      Boys Systolic BP Percentile --      Boys Diastolic BP Percentile --      Pulse Rate 11/21/23 1124 72     Resp 11/21/23 1124 20     Temp 11/21/23 1124 98.1 F (36.7 C)     Temp Source 11/21/23 1124 Oral     SpO2 11/21/23 1124 100 %  Weight 11/21/23 1126 232 lb (105.2 kg)     Height 11/21/23 1126 5' 7 (1.702 m)     Head Circumference --      Peak Flow --      Pain Score 11/21/23 1125 9     Pain Loc --      Pain Education --      Exclude from Growth Chart --     Most recent vital signs: Vitals:   11/21/23 1124  BP: (!) 163/107  Pulse: 72  Resp: 20  Temp: 98.1 F (36.7 C)  SpO2: 100%    General: Awake, no distress.  CV:  Good peripheral perfusion.  Regular rate rhythm Resp:  Normal effort.  Good air movement.  Bilateral basilar crackles. Abd:  Mild distention.  Soft nontender Other:  1+ pitting edema bilateral lower extremities   ED Results / Procedures / Treatments   Labs (all labs ordered are listed, but only abnormal results are displayed) Labs Reviewed  BASIC METABOLIC PANEL WITH GFR - Abnormal; Notable for the following components:      Result Value   Glucose, Bld 149 (*)    BUN 33 (*)     Creatinine, Ser 1.42 (*)    Calcium  8.4 (*)    GFR, Estimated 48 (*)    All other components within normal limits  CBC - Abnormal; Notable for the following components:   RBC 3.83 (*)    Hemoglobin 10.6 (*)    HCT 35.8 (*)    MCHC 29.6 (*)    RDW 20.6 (*)    All other components within normal limits  BRAIN NATRIURETIC PEPTIDE - Abnormal; Notable for the following components:   B Natriuretic Peptide 2,061.7 (*)    All other components within normal limits  POC URINE PREG, ED  TROPONIN I (HIGH SENSITIVITY)     EKG Interpreted by me Sinus rhythm rate of 76.  Right axis, normal intervals.  Normal QRS ST segments and T waves   RADIOLOGY Chest x-ray interpreted by me, reveals vascular congestion.  No pleural effusion pneumothorax pneumonia.  Radiology report reviewed   PROCEDURES:  Procedures   MEDICATIONS ORDERED IN ED: Medications  carvedilol  (COREG ) tablet 12.5 mg (has no administration in time range)  digoxin  (LANOXIN ) tablet 0.0625 mg (has no administration in time range)  sacubitril -valsartan  (ENTRESTO ) 97-103 mg per tablet (has no administration in time range)  spironolactone  (ALDACTONE ) tablet 25 mg (has no administration in time range)  dapagliflozin  propanediol (FARXIGA ) tablet 10 mg (has no administration in time range)  sodium chloride  flush (NS) 0.9 % injection 3 mL (has no administration in time range)  sodium chloride  flush (NS) 0.9 % injection 3 mL (has no administration in time range)  0.9 %  sodium chloride  infusion (has no administration in time range)  acetaminophen  (TYLENOL ) tablet 650 mg (has no administration in time range)  ondansetron  (ZOFRAN ) injection 4 mg (has no administration in time range)  enoxaparin  (LOVENOX ) injection 55 mg (has no administration in time range)  furosemide  (LASIX ) injection 40 mg (has no administration in time range)  furosemide  (LASIX ) injection 40 mg (40 mg Intravenous Given 11/21/23 1220)     IMPRESSION / MDM /  ASSESSMENT AND PLAN / ED COURSE  I reviewed the triage vital signs and the nursing notes.  DDx: Decompensated CHF, non-STEMI, electrolyte derangement, anemia  Patient's presentation is most consistent with acute presentation with potential threat to life or bodily function.  Patient presents with worsening symptoms of heart failure  including substantial weight gain over the last week or 2.  Unable to manage outpatient at this point due to severity of symptoms and social factors inhibiting her from obtaining a refill of her diuretic.  Will give IV Lasix , plan to admit.   Clinical Course as of 11/21/23 1345  Mon Nov 21, 2023  1240 Case d/w hospitalist [PS]    Clinical Course User Index [PS] Viviann Pastor, MD     FINAL CLINICAL IMPRESSION(S) / ED DIAGNOSES   Final diagnoses:  Acute on chronic systolic congestive heart failure (HCC)     Rx / DC Orders   ED Discharge Orders     None        Note:  This document was prepared using Dragon voice recognition software and may include unintentional dictation errors.   Viviann Pastor, MD 11/21/23 1345

## 2023-11-21 NOTE — H&P (Signed)
 History and Physical    Molly Howard:[id number] DOB: [birth year] DOA: 11/21/2023  PCP: Molly Fend, DO (Confirm with patient/family/NH records and if not entered, this has to be entered at [facility] point of entry) Patient coming from: Home  I have personally briefly reviewed patient's old medical records in [facility]  Chief Complaint: SOB, increasing leg swelling  HPI: Molly Howard is a 38 y.o. 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  Patient claimed that she has been homeless and ran out of her CHF/BP medication 5 days ago, not been able to fill her prescriptions due to financial difficulties.  Gradually she started noticed increasing exertional dyspnea dry cough, and peripheral edema.  Denies any chest pain no fever chills.  ED Course: Afebrile, nontachycardic blood pressure 160/100 O2 sats room air 100%.  Chest x-ray showed pulmonary congestion and cardiomegaly, blood work showed K3.3 BUN 14 creatinine 0.8 glucose 290 WBC 8.5.  Patient was given IV Lasix  40 mg x 1 in the ED.  Review of Systems: As per HPI otherwise 14 point review of systems negative.    Past Medical History:  Diagnosis Date   Acid reflux    Chronic HFrEF (heart failure with reduced ejection fraction) (HCC)    a. 10/2019 Echo: EF 35-40%, GrII DD; b. 05/2020 Echo: EF 20-25%, glob HK; c. 10/2020 Echo: EF 30-35%, glob HK. GrII DD, Mildly red RV fxn. Mod TR; d.  05/2021 cMRI: EF 42%, no LGE. Nl RV size/fxn.   CKD (chronic kidney disease), stage II    Diabetes mellitus (HCC)    H/O medication noncompliance    Hypertension    Microcytic anemia    NICM (nonischemic cardiomyopathy) (HCC)    a. 10/2019 Echo: EF 35-40%; b. 10/2019 MV: No ischemia. Small apical defect-->breast attenuation; c. 05/2020  Echo: EF 20-25%; d. 10/2020 Echo: EF 30-35%, glob HK. GrII DD, Mildly red RV fxn. Mod TR; e. 05/2021 cMRI: EF 42%, no LGE. Nl RV size/fxn.   Obesity    Polysubstance abuse [facility])     Past Surgical History:  Procedure Laterality Date   CHOLECYSTECTOMY     HERNIA REPAIR     ICD IMPLANT     RIGHT HEART CATH N/A 07/28/2022   Procedure: RIGHT HEART CATH;  Surgeon: Molly Deatrice LABOR, MD;  Location: [facility] INVASIVE CV LAB;  Service: Cardiovascular;  Laterality: N/A;   SUBQ ICD IMPLANT N/A 06/27/2023   Procedure: SUBQ ICD IMPLANT;  Surgeon: Molly Ole DASEN, MD;  Location: [facility] INVASIVE CV LAB;  Service: Cardiovascular;  Laterality: N/A;     reports that she quit smoking about 3 years ago. Her smoking use included cigarettes. She has never used smokeless tobacco. She reports that she does not currently use alcohol. She reports that she does not currently use drugs after having used the following drugs: Cocaine.  Allergies  Allergen Reactions   Other Rash    Lemons     Family History  Problem Relation Age of Onset   Heart failure Mother        Onset of heart failure 40s.  Died in 05-31-22.   Diabetes Mother    Hypertension Father    Diabetes Father      Prior to Admission medications   Medication Sig Start Date End Date Taking? Authorizing Provider  carvedilol  (  COREG ) 12.5 MG tablet Take 1 tablet (12.5 mg total) by mouth 2 (two) times daily with a meal. 10/12/23  Yes Tobie Calix, MD  dapagliflozin  propanediol (FARXIGA ) 10 MG TABS tablet Take 1 tablet (10 mg total) by mouth once daily. 10/12/23  Yes Patel, Sona, MD  digoxin  (LANOXIN ) 0.125 MG tablet Take 0.5 tablets (0.0625 mg total) by mouth daily. 10/12/23  Yes Tobie Calix, MD  ferrous sulfate  325 (65 FE) MG tablet Take 1 tablet (325 mg total) by mouth 2 (two) times daily. 10/12/23 10/11/24 Yes Patel, Sona, MD  sacubitril -valsartan  (ENTRESTO ) 97-103 MG Take 1 tablet by mouth 2 (two) times daily. 10/12/23  Yes Patel, Sona, MD  spironolactone   (ALDACTONE ) 25 MG tablet Take 1 tablet (25 mg total) by mouth daily. 10/12/23  Yes Patel, Sona, MD  torsemide  (DEMADEX ) 20 MG tablet Take 1 tablet (20 mg total) by mouth daily. 10/12/23  Yes Patel, Sona, MD  torsemide  (DEMADEX ) 20 MG tablet Take 1 tablet (20 mg total) by mouth daily. 11/19/23  Yes Claudene Rover, MD  Accu-Chek Softclix Lancets lancets Use as instructed 11/18/22   Molly Fend, DO  Accu-Chek Softclix Lancets lancets Use as directed to check blood sugar three times daily 10/12/23   Patel, Sona, MD  Blood Glucose Monitoring Suppl (ACCU-CHEK GUIDE ME) w/Device KIT Use as directed to check blood sugar three times daily 10/12/23   Patel, Sona, MD  Continuous Glucose Sensor (FREESTYLE LIBRE 3 SENSOR) MISC Place 1 sensor on the skin every 14 days. Use to check glucose continuously 01/14/23   Molly Fend, DO  glucose blood test strip Check blood sugar three times daily as directed 10/12/23   Patel, Sona, MD  Potassium Chloride  40 MEQ/15ML (20%) SOLN Take 40 mEq by mouth 2 (two) times daily. Patient not taking: Reported on 06/07/2023 07/04/20 10/18/20  Darliss Rogue, MD    Physical Exam: Vitals:   11/21/23 1124 11/21/23 1126  BP: (!) 163/107   Pulse: 72   Resp: 20   Temp: 98.1 F (36.7 C)   TempSrc: Oral   SpO2: 100%   Weight:  105.2 kg  Height:  5' 7 (1.702 m)    Constitutional: NAD, calm, comfortable Vitals:   11/21/23 1124 11/21/23 1126  BP: (!) 163/107   Pulse: 72   Resp: 20   Temp: 98.1 F (36.7 C)   TempSrc: Oral   SpO2: 100%   Weight:  105.2 kg  Height:  5' 7 (1.702 m)   Eyes: PERRL, lids and conjunctivae normal ENMT: Mucous membranes are moist. Posterior pharynx clear of any exudate or lesions.Normal dentition.  Neck: normal, supple, no masses, no thyromegaly Respiratory: clear to auscultation bilaterally, no wheezing, fine crackles on bilateral lung field, increasing respiratory effort. No accessory muscle use.  Cardiovascular: Regular rate and rhythm,  no murmurs / rubs / gallops. 2+ extremity edema. 2+ pedal pulses. No carotid bruits.  Abdomen: no tenderness, no masses palpated. No hepatosplenomegaly. Bowel sounds positive.  Musculoskeletal: no clubbing / cyanosis. No joint deformity upper and lower extremities. Good ROM, no contractures. Normal muscle tone.  Skin: no rashes, lesions, ulcers. No induration Neurologic: CN 2-12 grossly intact. Sensation intact, DTR normal. Strength 5/5 in all 4.  Psychiatric: Normal judgment and insight. Alert and oriented x 3. Normal mood.     Labs on Admission: I have personally reviewed following labs and imaging studies  CBC: Recent Labs  Lab 11/19/23 2205 11/21/23 1124  WBC 7.0 6.7  HGB 10.3* 10.6*  HCT 35.2* 35.8*  MCV 94.1 93.5  PLT 306 342   Basic Metabolic Panel: Recent Labs  Lab 11/19/23 2205 11/21/23 1124  NA 138 136  K 4.8 5.0  CL 107 104  CO2 22 23  GLUCOSE 268* 149*  BUN 36* 33*  CREATININE 1.58* 1.42*  CALCIUM  8.0* 8.4*   GFR: Estimated Creatinine Clearance: 66.3 mL/min (A) (by C-G formula based on SCr of 1.42 mg/dL (H)). Liver Function Tests: No results for input(s): AST, ALT, ALKPHOS, BILITOT, PROT, ALBUMIN in the last 168 hours. No results for input(s): LIPASE, AMYLASE in the last 168 hours. No results for input(s): AMMONIA in the last 168 hours. Coagulation Profile: No results for input(s): INR, PROTIME in the last 168 hours. Cardiac Enzymes: No results for input(s): CKTOTAL, CKMB, CKMBINDEX, TROPONINI in the last 168 hours. BNP (last 3 results) No results for input(s): PROBNP in the last 8760 hours. HbA1C: No results for input(s): HGBA1C in the last 72 hours. CBG: No results for input(s): GLUCAP in the last 168 hours. Lipid Profile: No results for input(s): CHOL, HDL, LDLCALC, TRIG, CHOLHDL, LDLDIRECT in the last 72 hours. Thyroid  Function Tests: No results for input(s): TSH, T4TOTAL, FREET4, T3FREE,  THYROIDAB in the last 72 hours. Anemia Panel: No results for input(s): VITAMINB12, FOLATE, FERRITIN, TIBC, IRON , RETICCTPCT in the last 72 hours. Urine analysis:    Component Value Date/Time   COLORURINE YELLOW (A) 08/28/2023 1041   APPEARANCEUR HAZY (A) 08/28/2023 1041   LABSPEC 1.012 08/28/2023 1041   PHURINE 5.0 08/28/2023 1041   GLUCOSEU NEGATIVE 08/28/2023 1041   HGBUR MODERATE (A) 08/28/2023 1041   BILIRUBINUR NEGATIVE 08/28/2023 1041   KETONESUR NEGATIVE 08/28/2023 1041   PROTEINUR >=300 (A) 08/28/2023 1041   NITRITE NEGATIVE 08/28/2023 1041   LEUKOCYTESUR TRACE (A) 08/28/2023 1041    Radiological Exams on Admission: DG Chest 2 View Result Date: 11/21/2023 CLINICAL DATA:  Shortness of breath with fluid retention. Left shoulder pain. History of congestive heart failure. EXAM: CHEST - 2 VIEW COMPARISON:  Radiographs 11/19/2023 and 10/10/2023.  CT 08/07/2023. FINDINGS: The heart size and mediastinal contours are stable with moderate cardiomegaly. Presternal subcutaneous ICD appears unchanged. Persistent vascular congestion without overt pulmonary edema. No confluent airspace disease, pneumothorax or significant pleural effusion. The bones appear unchanged. IMPRESSION: Stable cardiomegaly and vascular congestion without overt pulmonary edema. No acute cardiopulmonary process. Electronically Signed   By: Elsie Perone M.D.   On: 11/21/2023 12:17   DG Chest 2 View Result Date: 11/19/2023 EXAM: 2 VIEW(S) XRAY OF THE CHEST 11/19/2023 10:44:00 PM COMPARISON: 10/10/2023 CXR CLINICAL HISTORY: shob. Arrived pov, ambulatory to triage for bilateral LE and abd swelling, and shob x3 days. I am out of my fluid pills and am filling with fluid. Hx of chf, and pace maker. Respirations even and unlabored, able to speak in complete sentences. FINDINGS: LUNGS AND PLEURA: Stable pulmonary vascular congestion without frank interstitial edema. No focal pulmonary opacity. No pleural effusion.  No pneumothorax. HEART AND MEDIASTINUM: Stable mild cardiomegaly. Stable trans-sternal pacemaker. No acute abnormality of the cardiac and mediastinal silhouettes. BONES AND SOFT TISSUES: No acute osseous abnormality. IMPRESSION: 1. Cardiomegaly with pulmonary vascular congestion. No frank interstitial edema. Electronically signed by: Pinkie Pebbles MD 11/19/2023 10:48 PM EDT RP Workstation: [id number]    EKG: Independently reviewed.  Sinus rhythm, no acute ST changes.  Assessment/Plan Principal Problem:   CHF (congestive heart failure) (HCC) Active Problems:   Acute on chronic combined systolic and diastolic CHF (congestive heart failure) (HCC)  (please populate well all  problems here in Problem List. (For example, if patient is on BP meds at home and you resume or decide to hold them, it is a problem that needs to be her. Same for CAD, COPD, HLD and so on)  Acute on chronic combined HFrEF and HFpEF decompensation Nonischemic cardiomyopathy status post AICD - CHF decompensation secondary to noncompliant with BP/CHF medications - Continue IV diuresis Lasix  40 mg twice daily - Educated patient regarding importance of medication compliance.  Consult case management to assist financial issues. - Continue Coreg , digoxin  Entresto  and Aldactone   History of cocaine abuse - Patient denied any cocaine abuse recently.  Will check UDS - Continue Coreg   IIDM - Continue Farxiga   CKD stage III - Creatinine level stable and volume overloaded secondary to CHF decompensation - IV diuresis as above  DVT prophylaxis: Lovenox  Code Status: Full code Family Communication: None at bedside Disposition Plan: Expect less than 2 midnight hospital stay Consults called: None Admission status: Telemetry observation   Cort ONEIDA Mana MD [facility] Pager [phone]  11/21/2023, 1:44 PM

## 2023-11-21 NOTE — ED Notes (Signed)
 Pt provided sandwich tray by tech per request

## 2023-11-21 NOTE — ED Triage Notes (Addendum)
 Pt with hx of CHF and pacemaker presents with increased fluid retention and ShOB. She has associated L shoulder pain. Pt was seen 2 days ago in the ED for the same complaint as she was out of her Torsemide . She was given a new Rx for Torsemide , but has not been able to pick it up yet. Pt states her normal weight is 215 lbs and now she is 232 lbs.

## 2023-11-22 ENCOUNTER — Other Ambulatory Visit: Payer: Self-pay

## 2023-11-22 DIAGNOSIS — I5043 Acute on chronic combined systolic (congestive) and diastolic (congestive) heart failure: Principal | ICD-10-CM

## 2023-11-22 LAB — BASIC METABOLIC PANEL WITH GFR
Anion gap: 10 (ref 5–15)
BUN: 38 mg/dL — ABNORMAL HIGH (ref 6–20)
CO2: 21 mmol/L — ABNORMAL LOW (ref 22–32)
Calcium: 8.1 mg/dL — ABNORMAL LOW (ref 8.9–10.3)
Chloride: 105 mmol/L (ref 98–111)
Creatinine, Ser: 1.34 mg/dL — ABNORMAL HIGH (ref 0.44–1.00)
GFR, Estimated: 52 mL/min — ABNORMAL LOW (ref >60)
Glucose, Bld: 166 mg/dL — ABNORMAL HIGH (ref 70–99)
Potassium: 4.8 mmol/L (ref 3.5–5.1)
Sodium: 136 mmol/L (ref 135–145)

## 2023-11-22 NOTE — Progress Notes (Signed)
 Heart Failure Navigator Progress Note  Patient was discharged today from the ED here at [facility].  She showed up at the [facility] saying she had been discharged from the hospital and had no ride home. She said that they were supposed to set up a taxi voucher for her from the ED but did not. Taxi was called for transport to the [facility] @ [address] , [employer] for patient. Patient did have her discharge medications in hand and was given a red medication bag for transporting those.  Patient aware of her follow-up appointment here on Thursday @ 2:30 PM.  Navigator will sign off at this time.  Charmaine Pines, RN, BSN [facility] Heart Failure Navigator Secure Chat Only

## 2023-11-22 NOTE — Progress Notes (Signed)
 Heart Failure Stewardship Pharmacy Note  PCP: [name], DO PCP-Cardiologist: Redell Cave, MD  HPI: Molly Howard is a 38 y.o. female with combined systolic and diastolic CHF, stage II chronic kidney disease, type 2 diabetes mellitus, hypertension, and nonischemic cardiomyopathy, GERD as well as polysubstance abuse with multiple recent CHF admissions who presented with shortness of breath, abdominal distention, orthopnea, and intermittent lower extremity edema. Has difficulty with medication adherence and appears to have been out of medications for ~1 month. She has been alternating between staying in a motel and a park for the past 6 months and has been nonadherent to medications. On admission, BNP was 2061.7, HS-troponin was 12, and UDS negative. Chest x-ray noted vascular congestion.     Pertinent Cardiac History: Noted to have CHF in 2021 where echo showed LVEF of 35-40%. Cardiac MRI in 05/2020 showed an EF of 42%, mild LV dilatation, RVEF 50%, with no delayed enhancement. Echo in 07/2022 with LVEF of 25 to 30%, mildly decreased RV systolic function, moderate to severe tricuspid regurgitation, and mild mitral regurgitation. RHC in 07/2022 with severely elevated RA pressure of 30, CO/CI low at 4.47/2, PAPI of 0.8,  and high PA pressures due to volume overload. Since, she has had multiple admissions for CHF. Admitted 02/09/22 due to acute on chronic HF. + cocaine. Admitted 07/24/22 due to decompensated HFrEF. Required milrinone  drip and diuresed from 116 kg >> 91 kg. Admitted 11/05/22 due to swelling and fluid retention in arms and legs along with some central chest tightness and shortness of breath. Required milrinone  for diuresis. Zio patch 12/2022 with 24 VT runs with no sustained arrhythmias. Received subcutaneous ICD 06/2023. Echocardiogram 08/07/23 showed LVEF of 30-35%, mild LVH, low normal RV function, mild MR, mild-moderate TR, mild PR.   Pertinent Lab Values: Creat  Date Value Ref  Range Status  12/14/2022 1.45 (H) 0.50 - 0.97 mg/dL Final   Creatinine, Ser  Date Value Ref Range Status  11/22/2023 1.34 (H) 0.44 - 1.00 mg/dL Final   BUN  Date Value Ref Range Status  11/22/2023 38 (H) 6 - 20 mg/dL Final  97/89/7974 15 6 - 20 mg/dL Final   Potassium  Date Value Ref Range Status  11/22/2023 4.8 3.5 - 5.1 mmol/L Final   Sodium  Date Value Ref Range Status  11/22/2023 136 135 - 145 mmol/L Final  06/13/2023 142 134 - 144 mmol/L Final   B Natriuretic Peptide  Date Value Ref Range Status  11/21/2023 2,061.7 (H) 0.0 - 100.0 pg/mL Final    Comment:    Performed at [facility], [address] [place], [place] [address]   Magnesium   Date Value Ref Range Status  10/02/2023 2.5 (H) 1.7 - 2.4 mg/dL Final    Comment:    Performed at [facility], [address] [place], [place] [address]   Hgb A1c MFr Bld  Date Value Ref Range Status  08/06/2023 6.7 (H) 4.8 - 5.6 % Final    Comment:    (NOTE)         Prediabetes: 5.7 - 6.4         Diabetes: >6.4         Glycemic control for adults with diabetes: <7.0    Digoxin  Level  Date Value Ref Range Status  04/20/2023 0.3 (L) 0.8 - 2.0 ng/mL Final    Comment:    Performed at [facility], [address] [place], [place] [address]   TSH  Date Value Ref  Range Status  11/09/2021 5.114 (H) 0.350 - 4.500 uIU/mL Final    Comment:    Performed by a 3rd Generation assay with a functional sensitivity of <=0.01 uIU/mL. Performed at [facility], [address] [place], [place] [address]    LDH  Date Value Ref Range Status  04/15/2023 216 (H) 98 - 192 U/L Final    Comment:    Performed at [facility], [address] [place], [place] [address]    Vital Signs:  Temp:  [98.1 F (36.7 C)-98.3 F (36.8 C)] 98.3 F (36.8 C) (07/21 2136) Pulse Rate:  [64-75] 64 (07/22 0530) Cardiac Rhythm: Normal sinus rhythm (07/21 2327) Resp:  [16-23] 16 (07/22 0530) BP:  (143-163)/(90-107) 143/94 (07/22 0530) SpO2:  [95 %-100 %] 95 % (07/22 0530) Weight:  [105.2 kg (232 lb)] 105.2 kg (232 lb) (07/21 1126) No intake or output data in the 24 hours ending 11/22/23 0803   Current Heart Failure Medications:  Loop diuretic: furosemide  40 mg IV BID Beta-Blocker: carvedilol  12.5 mg BID  ACEI/ARB/ARNI: Entresto  97-103 mg BID MRA: spironolactone  25 mg daily SGLT2i: Farxiga  10 mg daily Other: digoxin  0.0625 mg daily  Prior to admission Heart Failure Medications:  Loop diuretic: torsemide  40 mg daily (not taking) Beta-Blocker: carvedilol  12.5 mg BID  ACEI/ARB/ARNI: Entresto  97-103 mg BID MRA: spironolactone  25 mg daily (not taking PTA) SGLT2i: Farxiga  10 mg daily Other: digoxin  0.0625 mg daily (not taking PTA)  Assessment: 1. Acute on chronic systolic heart failure (LVEF 30-35%) with low normal RV function, due to NICM. NYHA class II symptoms.  -Symptoms: Patient reports some improvement in shortness of breath. LEE still present. Appetite is fair. Complains of shoulder pain. -Volume: Hypervolemic on exam. Weight is up. JVP elevated. I/Os inaccurate. Consider increasing furosemide  to 80 mg IV BID.  -Hemodynamics:  BP close to goal on current medications. -BB: Continue carvedilol  12.5 mg BID. -ACEI/ARB/ARNI: Continue Entresto  97/103 mg BID -MRA:   Continue spironolactone  25 mg daily -SGLT2i: Continue Farxiga  10 mg daily  Plan: 1) Medication changes recommended at this time: -Consider increasing furosemide  to 80 mg IV BID  2) Patient assistance: -Copays are $4  3) Education: - Patient has been educated on current HF medications and potential additions to HF medication regimen - Patient verbalizes understanding that over the next few months, these medication doses may change and more medications may be added to optimize HF regimen - Patient has been educated on basic disease state pathophysiology and goals of therapy - Patient reports that she is unlikely  to take torsemide  and spironolactone  until she has a stable living environment. Discussed the benefits of GDMT and loop diuretics to maintain euvolemia with encouragement to take medications as prescribed.  Medication Assistance / Insurance Benefits Check: Does the patient have prescription insurance?    Type of insurance plan:  Does the patient qualify for medication assistance through manufacturers or grants? No  Outpatient Pharmacy: Prior to admission outpatient pharmacy: [facility]      Please do not hesitate to reach out with questions or concerns,  Jaun Bash, PharmD, CPP, BCPS Heart Failure Pharmacist  Phone - [phone] 11/22/2023 8:03 AM

## 2023-11-22 NOTE — Discharge Summary (Signed)
 [name] FMW:[id number] DOB: [birth year] DOA: 11/21/2023  PCP: [name], DO  Admit date: 11/21/2023  Discharge date: 11/22/2023  Admitted From: Home   disposition: Home   Recommendations for Outpatient Follow-up:   Follow up with PCP in 2 to 3 weeks for assessment of respiratory status and refill of medications   Home Health: N/A Equipment/Devices: Glucometer Consultations: TOC Discharge Condition: Improved CODE STATUS: Full Diet Recommendation: Heart Healthy low-sodium  Diet Order             Diet - low sodium heart healthy           Diet heart healthy/carb modified Room service appropriate? Yes; Fluid consistency: Thin  Diet effective now                    No chief complaint on file.    Brief history of present illness from the day of admission and additional interim summary    Molly Howard is a 38 y.o. female with HFrEF EF 30 to 35% secondary to nonischemic cardiomyopathy, status post AICD 2/25, HTN, IIDM, CKD stage IIIa, polysubstance abuse, presented with worsening of exertional dyspnea and  peripheral edema.   Patient claimed that she has been homeless and ran out of her CHF/BP medication 5 days ago, not been able to fill her prescriptions due to financial difficulties.  Gradually she started noticed increasing exertional dyspnea dry cough, and peripheral edema.  Denies any chest pain no fever chills.   ED Course: Afebrile, nontachycardic blood pressure 160/100 O2 sats room air 100%.  Chest x-ray showed pulmonary congestion and cardiomegaly, blood work showed K3.3 BUN 14 creatinine 0.8 glucose 290 WBC 8.5.                                                                   Hospital Course   Patient was admitted for diuresis secondary to mild pulmonary edema from not being able to  take her medications.  Patient was treated with Lasix  40 mg IV twice daily.  Day after admission patient stated she felt much better and was back to baseline.  Noted she was able to walk to the bathroom and I did not get short of breath this time.  No chest pain.  States she is having financial difficulty refilling her medications which is why she stopped taking them about 5 days ago.  HFrEF EF 30 to 35% Patient is on excellent GDMT, will continue patient on same  Medication noncompliance secondary to financial difficulty TOC consulted  DM 2 Continue Farxiga , follow-up with PCP for ongoing management  CKD 3 Creatinine with modest improvement with diuresis, creatinine is at baseline    Discharge diagnosis     Principal Problem:   CHF (congestive heart failure) (HCC) Active Problems:  Acute on chronic combined systolic and diastolic CHF (congestive heart failure) (HCC)    Discharge instructions    Discharge Instructions     Diet - low sodium heart healthy   Complete by: As directed    Discharge instructions   Complete by: As directed    It will be very important for you to be able to take your medications regularly. I have requested the social worker to see you before you leave to see what medication assistance can be provided.   Increase activity slowly   Complete by: As directed        Discharge Medications   Allergies as of 11/22/2023       Reactions   Other Rash   Lemons         Medication List     STOP taking these medications    Accu-Chek Guide Test test strip Generic drug: glucose blood   Accu-Chek Guide w/Device Kit       TAKE these medications    Accu-Chek Softclix Lancets lancets Use as instructed What changed: Another medication with the same name was removed. Continue taking this medication, and follow the directions you see here.   carvedilol  12.5 MG tablet Commonly known as: COREG  Take 1 tablet (12.5 mg total) by mouth 2 (two)  times daily with a meal.   digoxin  0.125 MG tablet Commonly known as: LANOXIN  Take 0.5 tablets (0.0625 mg total) by mouth daily.   Entresto  97-103 MG Generic drug: sacubitril -valsartan  Take 1 tablet by mouth 2 (two) times daily.   Farxiga  10 MG Tabs tablet Generic drug: dapagliflozin  propanediol Take 1 tablet (10 mg total) by mouth once daily.   FeroSul 325 (65 Fe) MG tablet Generic drug: ferrous sulfate  Take 1 tablet (325 mg total) by mouth 2 (two) times daily.   FreeStyle Libre 3 Sensor Misc Place 1 sensor on the skin every 14 days. Use to check glucose continuously   spironolactone  25 MG tablet Commonly known as: ALDACTONE  Take 1 tablet (25 mg total) by mouth daily.   torsemide  20 MG tablet Commonly known as: DEMADEX  Take 1 tablet (20 mg total) by mouth daily. What changed: Another medication with the same name was removed. Continue taking this medication, and follow the directions you see here.         Follow-up Information     [facility]. Go on 12/05/2023.   Specialty: Cardiology Why: Hospital Follow-Up 12/05/23 @ 1:30 PM Bring all medications to follow-up appt [facility] [address] Floor Contact information: [address] [place]  [address] [phone]                Major procedures and Radiology Reports - PLEASE review detailed and final reports thoroughly  -       DG Chest 2 View Result Date: 11/21/2023 CLINICAL DATA:  Shortness of breath with fluid retention. Left shoulder pain. History of congestive heart failure. EXAM: CHEST - 2 VIEW COMPARISON:  Radiographs 11/19/2023 and 10/10/2023.  CT 08/07/2023. FINDINGS: The heart size and mediastinal contours are stable with moderate cardiomegaly. Presternal subcutaneous ICD appears unchanged. Persistent vascular congestion without overt pulmonary edema. No confluent airspace disease, pneumothorax or significant  pleural effusion. The bones appear unchanged. IMPRESSION: Stable cardiomegaly and vascular congestion without overt pulmonary edema. No acute cardiopulmonary process. Electronically Signed   By: Elsie Perone M.D.   On: 11/21/2023 12:17   DG Chest 2 View Result Date: 11/19/2023  EXAM: 2 VIEW(S) XRAY OF THE CHEST 11/19/2023 10:44:00 PM COMPARISON: 10/10/2023 CXR CLINICAL HISTORY: shob. Arrived pov, ambulatory to triage for bilateral LE and abd swelling, and shob x3 days. I am out of my fluid pills and am filling with fluid. Hx of chf, and pace maker. Respirations even and unlabored, able to speak in complete sentences. FINDINGS: LUNGS AND PLEURA: Stable pulmonary vascular congestion without frank interstitial edema. No focal pulmonary opacity. No pleural effusion. No pneumothorax. HEART AND MEDIASTINUM: Stable mild cardiomegaly. Stable trans-sternal pacemaker. No acute abnormality of the cardiac and mediastinal silhouettes. BONES AND SOFT TISSUES: No acute osseous abnormality. IMPRESSION: 1. Cardiomegaly with pulmonary vascular congestion. No frank interstitial edema. Electronically signed by: Pinkie Pebbles MD 11/19/2023 10:48 PM EDT RP Workstation: [id number]    Micro Results    No results found for this or any previous visit (from the past 240 hours).  Today   Subjective    Molly Howard feels much improved since admission.  Feels ready to go home.  Denies chest pain, shortness of breath or abdominal pain.  Feels they can take care of themselves with the resources they have at home.  Objective   Blood pressure 132/77, pulse 65, temperature 98.4 F (36.9 C), temperature source Oral, resp. rate 18, height 5' 7 (1.702 m), weight 105.2 kg, last menstrual period 11/21/2023, SpO2 98%.  No intake or output data in the 24 hours ending 11/22/23 1156  Exam General: Patient appears well and in good spirits sitting up in bed in no acute distress.  Eyes: sclera anicteric, conjuctiva mild  injection bilaterally CVS: S1-S2, regular  Respiratory: Reasonable air entry bilaterally with no rales, no wheezes, no adventitious sounds GI: NABS, soft, NT  LE: No edema.  Neuro: A/O x 3, Moving all extremities equally with normal strength, CN 3-12 intact, grossly nonfocal.  Psych: patient is logical and coherent, judgement and insight appear normal, mood and affect appropriate to situation.    Data Review   CBC w Diff:  Lab Results  Component Value Date   WBC 6.7 11/21/2023   HGB 10.6 (L) 11/21/2023   HGB 11.5 06/13/2023   HCT 35.8 (L) 11/21/2023   HCT 35.6 06/13/2023   PLT 342 11/21/2023   PLT 448 06/13/2023   LYMPHOPCT 18 10/02/2023   MONOPCT 4 10/02/2023   EOSPCT 2 10/02/2023   BASOPCT 1 10/02/2023    CMP:  Lab Results  Component Value Date   NA 136 11/22/2023   NA 142 06/13/2023   K 4.8 11/22/2023   CL 105 11/22/2023   CO2 21 (L) 11/22/2023   BUN 38 (H) 11/22/2023   BUN 15 06/13/2023   CREATININE 1.34 (H) 11/22/2023   CREATININE 1.45 (H) 12/14/2022   PROT 6.6 10/14/2023   ALBUMIN 2.5 (L) 10/14/2023   BILITOT 0.6 10/14/2023   ALKPHOS 80 10/14/2023   AST 18 10/14/2023   ALT 16 10/14/2023  .   Total Time in preparing paper work, data evaluation and todays exam - 35 minutes  Ivan Vangie Pike M.D on 11/22/2023 at 11:56 AM  [facility]

## 2023-11-22 NOTE — Progress Notes (Addendum)
 Heart Failure Navigator Progress Note  Hospital follow-up scheduled for 11/24/23 @ 2:30 PM.  Patient stated she was now living at the [facility] in [place] and that she will set up her own transportation to appointment. Heart Failure signs and symptoms reviewed, and when to call the physician. Patient acknowledges understanding via teach back method and acceptance of all instructions. Discharge medications being delivered to the bedside for patient prior to her discharge today.    Navigator will sign off at this time.  Charmaine Pines, RN, BSN [facility] Heart Failure Navigator Secure Chat Only

## 2023-11-23 ENCOUNTER — Telehealth: Payer: Self-pay | Admitting: Family

## 2023-11-23 NOTE — Telephone Encounter (Signed)
 Called to confirm/remind patient of their appointment at the Advanced Heart Failure Clinic on 11/24/23.   Appointment:   [x] Confirmed  [] Left mess   [] No answer/No voice mail  [] VM Full/unable to leave message  [] Phone not in service  Patient reminded to bring all medications and/or complete list.  Confirmed patient has transportation. Gave directions, instructed to utilize valet parking.
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
## 2023-11-24 ENCOUNTER — Telehealth: Payer: Self-pay | Admitting: Family

## 2023-11-24 ENCOUNTER — Encounter: Admitting: Family

## 2023-11-24 NOTE — Telephone Encounter (Signed)
 Patient did not show for her Heart Failure Clinic appointment on 11/24/23.

## 2023-12-02 ENCOUNTER — Telehealth: Payer: Self-pay | Admitting: Family

## 2023-12-02 NOTE — Telephone Encounter (Signed)
 Called to confirm/remind patient of their appointment at the Advanced Heart Failure Clinic on 12/05/23.   Appointment:   [] Confirmed  [] Left mess   [] No answer/No voice mail  [x] VM Full/unable to leave message  [] Phone not in service  Patient reminded to bring all medications and/or complete list.  Confirmed patient has transportation. Gave directions, instructed to utilize valet parking.

## 2023-12-05 ENCOUNTER — Encounter

## 2023-12-19 ENCOUNTER — Other Ambulatory Visit (HOSPITAL_COMMUNITY): Payer: Self-pay

## 2024-01-03 ENCOUNTER — Ambulatory Visit (HOSPITAL_BASED_OUTPATIENT_CLINIC_OR_DEPARTMENT_OTHER): Payer: MEDICAID | Admitting: Family

## 2024-01-03 ENCOUNTER — Other Ambulatory Visit: Payer: Self-pay | Admitting: Family

## 2024-01-03 ENCOUNTER — Ambulatory Visit: Payer: Self-pay | Admitting: Family

## 2024-01-03 ENCOUNTER — Ambulatory Visit
Admission: RE | Admit: 2024-01-03 | Discharge: 2024-01-03 | Disposition: A | Discharge status: Home / Self Care | Payer: MEDICAID | Source: Ambulatory Visit | Attending: Family | Admitting: Family

## 2024-01-03 ENCOUNTER — Telehealth: Payer: Self-pay

## 2024-01-03 ENCOUNTER — Other Ambulatory Visit: Payer: Self-pay

## 2024-01-03 ENCOUNTER — Encounter: Payer: Self-pay | Admitting: Family

## 2024-01-03 ENCOUNTER — Other Ambulatory Visit (HOSPITAL_COMMUNITY): Payer: Self-pay

## 2024-01-03 VITALS — BP 159/106 | HR 76 | Wt 264.6 lb

## 2024-01-03 DIAGNOSIS — I10 Essential (primary) hypertension: Secondary | ICD-10-CM | POA: Diagnosis not present

## 2024-01-03 DIAGNOSIS — Z95810 Presence of automatic (implantable) cardiac defibrillator: Secondary | ICD-10-CM | POA: Insufficient documentation

## 2024-01-03 DIAGNOSIS — Z91128 Patient's intentional underdosing of medication regimen for other reason: Secondary | ICD-10-CM | POA: Insufficient documentation

## 2024-01-03 DIAGNOSIS — I5022 Chronic systolic (congestive) heart failure: Secondary | ICD-10-CM | POA: Insufficient documentation

## 2024-01-03 DIAGNOSIS — Z5901 Sheltered homelessness: Secondary | ICD-10-CM | POA: Insufficient documentation

## 2024-01-03 DIAGNOSIS — I5082 Biventricular heart failure: Secondary | ICD-10-CM | POA: Insufficient documentation

## 2024-01-03 DIAGNOSIS — Z5900 Homelessness unspecified: Secondary | ICD-10-CM

## 2024-01-03 DIAGNOSIS — T501X6A Underdosing of loop [high-ceiling] diuretics, initial encounter: Secondary | ICD-10-CM | POA: Insufficient documentation

## 2024-01-03 DIAGNOSIS — N182 Chronic kidney disease, stage 2 (mild): Secondary | ICD-10-CM | POA: Insufficient documentation

## 2024-01-03 DIAGNOSIS — T383X6A Underdosing of insulin and oral hypoglycemic [antidiabetic] drugs, initial encounter: Secondary | ICD-10-CM | POA: Insufficient documentation

## 2024-01-03 DIAGNOSIS — I5020 Unspecified systolic (congestive) heart failure: Secondary | ICD-10-CM | POA: Diagnosis present

## 2024-01-03 DIAGNOSIS — E1122 Type 2 diabetes mellitus with diabetic chronic kidney disease: Secondary | ICD-10-CM | POA: Insufficient documentation

## 2024-01-03 DIAGNOSIS — D631 Anemia in chronic kidney disease: Secondary | ICD-10-CM | POA: Insufficient documentation

## 2024-01-03 DIAGNOSIS — I428 Other cardiomyopathies: Secondary | ICD-10-CM | POA: Insufficient documentation

## 2024-01-03 DIAGNOSIS — T460X6A Underdosing of cardiac-stimulant glycosides and drugs of similar action, initial encounter: Secondary | ICD-10-CM | POA: Insufficient documentation

## 2024-01-03 DIAGNOSIS — I130 Hypertensive heart and chronic kidney disease with heart failure and stage 1 through stage 4 chronic kidney disease, or unspecified chronic kidney disease: Secondary | ICD-10-CM | POA: Insufficient documentation

## 2024-01-03 DIAGNOSIS — D509 Iron deficiency anemia, unspecified: Secondary | ICD-10-CM

## 2024-01-03 DIAGNOSIS — N1831 Chronic kidney disease, stage 3a: Secondary | ICD-10-CM

## 2024-01-03 DIAGNOSIS — F17290 Nicotine dependence, other tobacco product, uncomplicated: Secondary | ICD-10-CM | POA: Insufficient documentation

## 2024-01-03 DIAGNOSIS — F1910 Other psychoactive substance abuse, uncomplicated: Secondary | ICD-10-CM

## 2024-01-03 LAB — LIPID PANEL
Cholesterol: 231 mg/dL — ABNORMAL HIGH (ref 0–200)
HDL: 68 mg/dL (ref >40)
LDL Cholesterol: 145 mg/dL — ABNORMAL HIGH (ref 0–99)
Total CHOL/HDL Ratio: 3.4 ratio
Triglycerides: 92 mg/dL (ref <150)
VLDL: 18 mg/dL (ref 0–40)

## 2024-01-03 LAB — BASIC METABOLIC PANEL WITH GFR
Anion gap: 7 (ref 5–15)
BUN: 45 mg/dL — ABNORMAL HIGH (ref 6–20)
CO2: 22 mmol/L (ref 22–32)
Calcium: 7.8 mg/dL — ABNORMAL LOW (ref 8.9–10.3)
Chloride: 106 mmol/L (ref 98–111)
Creatinine, Ser: 1.33 mg/dL — ABNORMAL HIGH (ref 0.44–1.00)
GFR, Estimated: 52 mL/min — ABNORMAL LOW (ref >60)
Glucose, Bld: 196 mg/dL — ABNORMAL HIGH (ref 70–99)
Potassium: 4.8 mmol/L (ref 3.5–5.1)
Sodium: 135 mmol/L (ref 135–145)

## 2024-01-03 LAB — DIGOXIN LEVEL: Digoxin Level: <0.4 ng/mL — ABNORMAL LOW (ref 0.8–2.0)

## 2024-01-03 LAB — BRAIN NATRIURETIC PEPTIDE: B Natriuretic Peptide: 1973.2 pg/mL — ABNORMAL HIGH (ref 0.0–100.0)

## 2024-01-03 MED ORDER — SEMAGLUTIDE(0.25 OR 0.5MG/DOS) 2 MG/3ML ~~LOC~~ SOPN
0.2500 mg | PEN_INJECTOR | SUBCUTANEOUS | 1 refills | Status: DC
  Filled 2024-01-03: qty 3, 42d supply, fill #0
  Filled 2024-01-03: qty 3, 28d supply, fill #0

## 2024-01-03 MED ORDER — FUROSEMIDE 10 MG/ML IJ SOLN
80.0000 mg | Freq: Once | INTRAMUSCULAR | Status: AC
  Administered 2024-01-03: 80 mg via INTRAVENOUS

## 2024-01-03 MED ORDER — POTASSIUM CHLORIDE CRYS ER 20 MEQ PO TBCR
EXTENDED_RELEASE_TABLET | ORAL | Status: AC
  Filled 2024-01-03: qty 2

## 2024-01-03 MED ORDER — POTASSIUM CHLORIDE CRYS ER 20 MEQ PO TBCR
40.0000 meq | EXTENDED_RELEASE_TABLET | Freq: Once | ORAL | Status: AC
  Administered 2024-01-03: 40 meq via ORAL

## 2024-01-03 MED ORDER — FUROSEMIDE 10 MG/ML IJ SOLN
INTRAMUSCULAR | Status: AC
  Filled 2024-01-03: qty 8

## 2024-01-03 MED ORDER — DIGOXIN 125 MCG PO TABS
0.0625 mg | ORAL_TABLET | Freq: Every day | ORAL | 1 refills | Status: AC
Start: 2024-01-03 — End: ?
  Filled 2024-01-03 (×2): qty 45, 90d supply, fill #0

## 2024-01-03 MED ORDER — SPIRONOLACTONE 25 MG PO TABS
25.0000 mg | ORAL_TABLET | Freq: Every day | ORAL | 1 refills | Status: DC
  Filled 2024-01-03: qty 90, 90d supply, fill #0

## 2024-01-03 MED ORDER — DAPAGLIFLOZIN PROPANEDIOL 10 MG PO TABS
10.0000 mg | ORAL_TABLET | Freq: Every day | ORAL | 1 refills | Status: DC
  Filled 2024-01-03: qty 90, 90d supply, fill #0

## 2024-01-03 MED ORDER — TORSEMIDE 20 MG PO TABS
20.0000 mg | ORAL_TABLET | Freq: Every day | ORAL | 5 refills | Status: DC
  Filled 2024-01-03 (×2): qty 30, 30d supply, fill #0

## 2024-01-03 NOTE — Patient Instructions (Addendum)
 Medication Changes:  Please take the Coreg /Carvedilol  twice daily  Begin Ozempic  0.25 MG weekly injection  Medication Refills have been sent to your pharmacy  Lab Work:  Labs done today, your results will be available in MyChart, we will contact you for abnormal readings.   Testing/Procedures:  You are going for IV Lasix  injection today   Special Instructions // Education:  Do the following things EVERYDAY: Weigh yourself in the morning before breakfast. Write it down and keep it in a log. Take your medicines as prescribed Eat low salt foods--Limit salt (sodium) to 2000 mg per day.  Stay as active as you can everyday Limit all fluids for the day to less than 2 liters   Follow-Up in: This Thursday, 01/05/24 at 9:30 AM    If you have any questions or concerns before your next appointment please send us  a message through [place] or call our office at [phone], If it is after office hours your call will be answered by our answering service and directed appropriately.     At the [facility], you and your health needs are our priority. We have a designated team specialized in the treatment of Heart Failure. This Care Team includes your primary Heart Failure Specialized Cardiologist (physician), Advanced Practice Providers (APPs- Physician Assistants and Nurse Practitioners), and Pharmacist who all work together to provide you with the care you need, when you need it.   You may see any of the following providers on your designated Care Team at your next follow up:  Dr. Toribio Fuel Dr. Ezra Shuck Dr. Ria Commander Dr. Odis Brownie Greig Mosses, NP Caffie Shed, [place] [address] [place], [place] Beckey Coe, NP [place] Lee, NP [name], NP Jaun Bash, PharmD
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
## 2024-01-04 ENCOUNTER — Encounter (HOSPITAL_COMMUNITY): Payer: Self-pay | Admitting: Internal Medicine

## 2024-01-04 ENCOUNTER — Other Ambulatory Visit (HOSPITAL_COMMUNITY): Payer: Self-pay

## 2024-01-04 ENCOUNTER — Inpatient Hospital Stay (HOSPITAL_COMMUNITY): Payer: MEDICAID

## 2024-01-04 ENCOUNTER — Inpatient Hospital Stay (HOSPITAL_COMMUNITY)
Admission: EM | Admit: 2024-01-04 | Discharge: 2024-01-06 | DRG: 092 | Disposition: A | Discharge status: Home / Self Care | Payer: MEDICAID | Attending: Student | Admitting: Student

## 2024-01-04 ENCOUNTER — Emergency Department (HOSPITAL_COMMUNITY): Payer: MEDICAID

## 2024-01-04 ENCOUNTER — Other Ambulatory Visit: Payer: Self-pay

## 2024-01-04 DIAGNOSIS — Z8673 Personal history of transient ischemic attack (TIA), and cerebral infarction without residual deficits: Secondary | ICD-10-CM

## 2024-01-04 DIAGNOSIS — N1831 Chronic kidney disease, stage 3a: Secondary | ICD-10-CM | POA: Diagnosis present

## 2024-01-04 DIAGNOSIS — R29818 Other symptoms and signs involving the nervous system: Secondary | ICD-10-CM | POA: Diagnosis not present

## 2024-01-04 DIAGNOSIS — Z7984 Long term (current) use of oral hypoglycemic drugs: Secondary | ICD-10-CM

## 2024-01-04 DIAGNOSIS — E1122 Type 2 diabetes mellitus with diabetic chronic kidney disease: Secondary | ICD-10-CM | POA: Diagnosis present

## 2024-01-04 DIAGNOSIS — Z79899 Other long term (current) drug therapy: Secondary | ICD-10-CM | POA: Diagnosis not present

## 2024-01-04 DIAGNOSIS — R7989 Other specified abnormal findings of blood chemistry: Secondary | ICD-10-CM | POA: Diagnosis not present

## 2024-01-04 DIAGNOSIS — Z8249 Family history of ischemic heart disease and other diseases of the circulatory system: Secondary | ICD-10-CM

## 2024-01-04 DIAGNOSIS — Z95810 Presence of automatic (implantable) cardiac defibrillator: Secondary | ICD-10-CM | POA: Diagnosis not present

## 2024-01-04 DIAGNOSIS — Z9049 Acquired absence of other specified parts of digestive tract: Secondary | ICD-10-CM

## 2024-01-04 DIAGNOSIS — Z6841 Body Mass Index (BMI) 40.0 and over, adult: Secondary | ICD-10-CM

## 2024-01-04 DIAGNOSIS — R29810 Facial weakness: Secondary | ICD-10-CM | POA: Diagnosis present

## 2024-01-04 DIAGNOSIS — Z833 Family history of diabetes mellitus: Secondary | ICD-10-CM

## 2024-01-04 DIAGNOSIS — R471 Dysarthria and anarthria: Secondary | ICD-10-CM | POA: Diagnosis present

## 2024-01-04 DIAGNOSIS — I130 Hypertensive heart and chronic kidney disease with heart failure and stage 1 through stage 4 chronic kidney disease, or unspecified chronic kidney disease: Secondary | ICD-10-CM | POA: Diagnosis present

## 2024-01-04 DIAGNOSIS — I5042 Chronic combined systolic (congestive) and diastolic (congestive) heart failure: Secondary | ICD-10-CM | POA: Diagnosis present

## 2024-01-04 DIAGNOSIS — R4701 Aphasia: Secondary | ICD-10-CM | POA: Diagnosis present

## 2024-01-04 DIAGNOSIS — I428 Other cardiomyopathies: Secondary | ICD-10-CM | POA: Diagnosis present

## 2024-01-04 DIAGNOSIS — Z91018 Allergy to other foods: Secondary | ICD-10-CM

## 2024-01-04 DIAGNOSIS — Z5901 Sheltered homelessness: Secondary | ICD-10-CM

## 2024-01-04 DIAGNOSIS — R531 Weakness: Secondary | ICD-10-CM

## 2024-01-04 DIAGNOSIS — E1165 Type 2 diabetes mellitus with hyperglycemia: Secondary | ICD-10-CM | POA: Diagnosis present

## 2024-01-04 DIAGNOSIS — Z716 Tobacco abuse counseling: Secondary | ICD-10-CM | POA: Diagnosis not present

## 2024-01-04 DIAGNOSIS — Z7985 Long-term (current) use of injectable non-insulin antidiabetic drugs: Secondary | ICD-10-CM

## 2024-01-04 LAB — D-DIMER, QUANTITATIVE: D-Dimer, Quant: 1.42 ug{FEU}/mL — ABNORMAL HIGH (ref 0.00–0.50)

## 2024-01-04 LAB — DIFFERENTIAL
Abs Immature Granulocytes: 0.03 K/uL (ref 0.00–0.07)
Basophils Absolute: 0.1 K/uL (ref 0.0–0.1)
Basophils Relative: 1 %
Eosinophils Absolute: 0.1 K/uL (ref 0.0–0.5)
Eosinophils Relative: 2 %
Immature Granulocytes: 1 %
Lymphocytes Relative: 16 %
Lymphs Abs: 1 K/uL (ref 0.7–4.0)
Monocytes Absolute: 0.4 K/uL (ref 0.1–1.0)
Monocytes Relative: 6 %
Neutro Abs: 4.5 K/uL (ref 1.7–7.7)
Neutrophils Relative %: 74 %

## 2024-01-04 LAB — CBC
HCT: 37.3 % (ref 36.0–46.0)
Hemoglobin: 11.1 g/dL — ABNORMAL LOW (ref 12.0–15.0)
MCH: 28.7 pg (ref 26.0–34.0)
MCHC: 29.8 g/dL — ABNORMAL LOW (ref 30.0–36.0)
MCV: 96.4 fL (ref 80.0–100.0)
Platelets: 327 K/uL (ref 150–400)
RBC: 3.87 MIL/uL (ref 3.87–5.11)
RDW: 18.9 % — ABNORMAL HIGH (ref 11.5–15.5)
WBC: 6.1 K/uL (ref 4.0–10.5)
nRBC: 0 % (ref 0.0–0.2)

## 2024-01-04 LAB — COMPREHENSIVE METABOLIC PANEL WITH GFR
ALT: 28 U/L (ref 0–44)
AST: 25 U/L (ref 15–41)
Albumin: 2.2 g/dL — ABNORMAL LOW (ref 3.5–5.0)
Alkaline Phosphatase: 105 U/L (ref 38–126)
Anion gap: 11 (ref 5–15)
BUN: 46 mg/dL — ABNORMAL HIGH (ref 6–20)
CO2: 18 mmol/L — ABNORMAL LOW (ref 22–32)
Calcium: 7.8 mg/dL — ABNORMAL LOW (ref 8.9–10.3)
Chloride: 103 mmol/L (ref 98–111)
Creatinine, Ser: 1.5 mg/dL — ABNORMAL HIGH (ref 0.44–1.00)
GFR, Estimated: 45 mL/min — ABNORMAL LOW (ref >60)
Glucose, Bld: 226 mg/dL — ABNORMAL HIGH (ref 70–99)
Potassium: 4.2 mmol/L (ref 3.5–5.1)
Sodium: 132 mmol/L — ABNORMAL LOW (ref 135–145)
Total Bilirubin: 0.7 mg/dL (ref 0.0–1.2)
Total Protein: 6.1 g/dL — ABNORMAL LOW (ref 6.5–8.1)

## 2024-01-04 LAB — HCG, SERUM, QUALITATIVE: Preg, Serum: NEGATIVE

## 2024-01-04 LAB — I-STAT CHEM 8, ED
BUN: 40 mg/dL — ABNORMAL HIGH (ref 6–20)
Calcium, Ion: 1.03 mmol/L — ABNORMAL LOW (ref 1.15–1.40)
Chloride: 105 mmol/L (ref 98–111)
Creatinine, Ser: 1.5 mg/dL — ABNORMAL HIGH (ref 0.44–1.00)
Glucose, Bld: 228 mg/dL — ABNORMAL HIGH (ref 70–99)
HCT: 36 % (ref 36.0–46.0)
Hemoglobin: 12.2 g/dL (ref 12.0–15.0)
Potassium: 4.4 mmol/L (ref 3.5–5.1)
Sodium: 134 mmol/L — ABNORMAL LOW (ref 135–145)
TCO2: 19 mmol/L — ABNORMAL LOW (ref 22–32)

## 2024-01-04 LAB — TROPONIN I (HIGH SENSITIVITY): Troponin I (High Sensitivity): 17 ng/L (ref <18)

## 2024-01-04 LAB — BRAIN NATRIURETIC PEPTIDE: B Natriuretic Peptide: 2362.6 pg/mL — ABNORMAL HIGH (ref 0.0–100.0)

## 2024-01-04 LAB — CBG MONITORING, ED
Glucose-Capillary: 242 mg/dL — ABNORMAL HIGH (ref 70–99)
Glucose-Capillary: 169 mg/dL — ABNORMAL HIGH (ref 70–99)

## 2024-01-04 LAB — ETHANOL: Alcohol, Ethyl (B): <15 mg/dL (ref <15)

## 2024-01-04 LAB — PROTIME-INR
INR: 1.1 (ref 0.8–1.2)
Prothrombin Time: 15.1 s (ref 11.4–15.2)

## 2024-01-04 LAB — APTT: aPTT: 28 s (ref 24–36)

## 2024-01-04 LAB — I-STAT CG4 LACTIC ACID, ED: Lactic Acid, Venous: 1.5 mmol/L (ref 0.5–1.9)

## 2024-01-04 LAB — GLUCOSE, CAPILLARY: Glucose-Capillary: 157 mg/dL — ABNORMAL HIGH (ref 70–99)

## 2024-01-04 MED ORDER — HEPARIN SODIUM (PORCINE) 5000 UNIT/ML IJ SOLN
5000.0000 [IU] | Freq: Two times a day (BID) | INTRAMUSCULAR | Status: DC
  Administered 2024-01-04 – 2024-01-05 (×3): 5000 [IU] via SUBCUTANEOUS
  Filled 2024-01-04 (×3): qty 1

## 2024-01-04 MED ORDER — SODIUM CHLORIDE 0.9 % IV SOLN: INTRAVENOUS | Status: DC

## 2024-01-04 MED ORDER — INSULIN ASPART 100 UNIT/ML IJ SOLN: 0.0000 [IU] | INTRAMUSCULAR | Status: DC | PRN

## 2024-01-04 MED ORDER — ACETAMINOPHEN 160 MG/5ML PO SOLN: 650.0000 mg | ORAL | Status: DC | PRN

## 2024-01-04 MED ORDER — TENECTEPLASE FOR STROKE: 25.0000 mg | PACK | Freq: Once | INTRAVENOUS | Status: DC

## 2024-01-04 MED ORDER — MIDAZOLAM HCL 2 MG/2ML IJ SOLN
INTRAMUSCULAR | Status: AC
  Filled 2024-01-04: qty 2

## 2024-01-04 MED ORDER — ACETAMINOPHEN 325 MG PO TABS: 650.0000 mg | ORAL_TABLET | ORAL | Status: DC | PRN

## 2024-01-04 MED ORDER — IOHEXOL 350 MG/ML SOLN
75.0000 mL | Freq: Once | INTRAVENOUS | Status: AC | PRN
  Administered 2024-01-04: 75 mL via INTRAVENOUS

## 2024-01-04 MED ORDER — ACETAMINOPHEN 650 MG RE SUPP: 650.0000 mg | RECTAL | Status: DC | PRN

## 2024-01-04 MED ORDER — STROKE: EARLY STAGES OF RECOVERY BOOK: Freq: Once | Status: DC
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
## 2024-01-04 NOTE — ED Provider Notes (Signed)
 [place] EMERGENCY DEPARTMENT AT [facility] Provider Note   CSN: [id number] Arrival date & time: 01/04/24  1147  An emergency department physician performed an initial assessment on this suspected stroke patient at 1150.  Patient presents with: Code Stroke   Ivet L Vandervoort is a 38 y.o. female.   38 year old female presents for evaluation of possible stroke.  Per EMS she dropped her phone at work and had some right-sided weakness and facial droop and trouble with speaking.  Neurology team at bedside on patient's arrival.  Patient with some right facial droop that seems to wax and wane, was drooling on arrival but currently she is sticking her tongue out but seems to be protecting her airway.  She was taken immediately to CT scan.        Prior to Admission medications   Medication Sig Start Date End Date Taking? Authorizing Provider  Accu-Chek Softclix Lancets lancets Use as instructed 11/18/22   [name], DO  carvedilol  (COREG ) 12.5 MG tablet Take 1 tablet (12.5 mg total) by mouth 2 (two) times daily with a meal. 10/12/23   Tobie Calix, MD  Continuous Glucose Sensor (FREESTYLE LIBRE 3 SENSOR) MISC Place 1 sensor on the skin every 14 days. Use to check glucose continuously 01/14/23   [name], DO  dapagliflozin  propanediol (FARXIGA ) 10 MG TABS tablet Take 1 tablet (10 mg total) by mouth once daily. 01/03/24   Donette Ellouise LABOR, FNP  digoxin  (LANOXIN ) 0.125 MG tablet Take 0.5 tablets (0.0625 mg total) by mouth daily. 01/03/24   Donette Ellouise LABOR, FNP  ferrous sulfate  325 (65 FE) MG tablet Take 1 tablet (325 mg total) by mouth 2 (two) times daily. 10/12/23 10/11/24  Patel, Sona, MD  sacubitril -valsartan  (ENTRESTO ) 97-103 MG Take 1 tablet by mouth 2 (two) times daily. 10/12/23   Patel, Sona, MD  Semaglutide ,0.25 or 0.5MG /DOS, 2 MG/3ML SOPN Inject 0.25 mg into the skin once a week. 01/03/24   Donette Ellouise LABOR, FNP  spironolactone  (ALDACTONE ) 25 MG tablet Take 1 tablet (25 mg  total) by mouth daily. 01/03/24   Donette Ellouise LABOR, FNP  torsemide  (DEMADEX ) 20 MG tablet Take 1 tablet (20 mg total) by mouth daily. 01/03/24   Donette Ellouise LABOR, FNP  Potassium Chloride  40 MEQ/15ML (20%) SOLN Take 40 mEq by mouth 2 (two) times daily. Patient not taking: Reported on 06/07/2023 07/04/20 10/18/20  Darliss Rogue, MD    Allergies: Other    Review of Systems  Updated Vital Signs BP (!) 164/117 (BP Location: Right Arm)   Pulse 87   Temp 97.6 F (36.4 C) (Oral)   Resp 12   Wt 121.2 kg   SpO2 96%   BMI 41.85 kg/m   Physical Exam Vitals and nursing note reviewed.  Constitutional:      General: She is not in acute distress.    Appearance: Normal appearance. She is well-developed. She is not ill-appearing.  HENT:     Head: Normocephalic and atraumatic.  Eyes:     Conjunctiva/sclera: Conjunctivae normal.  Cardiovascular:     Rate and Rhythm: Normal rate and regular rhythm.     Heart sounds: No murmur heard. Pulmonary:     Effort: Pulmonary effort is normal. No respiratory distress.     Breath sounds: Normal breath sounds.  Abdominal:     Palpations: Abdomen is soft.     Tenderness: There is no abdominal tenderness.  Musculoskeletal:        General: No swelling.  Cervical back: Neck supple.  Skin:    General: Skin is warm and dry.     Capillary Refill: Capillary refill takes less than 2 seconds.  Neurological:     Mental Status: She is alert.     Comments: Right-sided facial droop, moves all 4 extremities equally, patient with grunting and moaning in some expressive aphasia  Psychiatric:        Mood and Affect: Mood normal.     (all labs ordered are listed, but only abnormal results are displayed) Labs Reviewed  CBC - Abnormal; Notable for the following components:      Result Value   Hemoglobin 11.1 (*)    MCHC 29.8 (*)    RDW 18.9 (*)    All other components within normal limits  COMPREHENSIVE METABOLIC PANEL WITH GFR - Abnormal; Notable for the  following components:   Sodium 132 (*)    CO2 18 (*)    Glucose, Bld 226 (*)    BUN 46 (*)    Creatinine, Ser 1.50 (*)    Calcium  7.8 (*)    Total Protein 6.1 (*)    Albumin  2.2 (*)    GFR, Estimated 45 (*)    All other components within normal limits  I-STAT CHEM 8, ED - Abnormal; Notable for the following components:   Sodium 134 (*)    BUN 40 (*)    Creatinine, Ser 1.50 (*)    Glucose, Bld 228 (*)    Calcium , Ion 1.03 (*)    TCO2 19 (*)    All other components within normal limits  CBG MONITORING, ED - Abnormal; Notable for the following components:   Glucose-Capillary 242 (*)    All other components within normal limits  PROTIME-INR  APTT  DIFFERENTIAL  ETHANOL  HCG, SERUM, QUALITATIVE    EKG: EKG Interpretation Date/Time:  Wednesday January 04 2024 13:27:18 EDT Ventricular Rate:  88 PR Interval:  161 QRS Duration:  90 QT Interval:  381 QTC Calculation: 461 R Axis:   124  Text Interpretation: Sinus rhythm LAE, consider biatrial enlargement Right axis deviation Borderline T wave abnormalities Compared with prior EKG from 11/21/2023 Confirmed by Gennaro Bouchard ([id number]) on 01/04/2024 2:00:06 PM  Radiology: MR BRAIN WO CONTRAST Result Date: 01/04/2024 EXAM: MRI BRAIN WITHOUT CONTRAST 01/04/2024 12:58:13 PM TECHNIQUE: Multiplanar multisequence MRI of the head/brain was performed without the administration of intravenous contrast. COMPARISON: Same day head CT. CLINICAL HISTORY: Neuro deficit, acute, stroke suspected. Code stroke. FINDINGS: Limited examination patient was unable to continue axial DWI and ADC images were obtained. No additional images are obtained. Additionally, there is artifact along the anterior skull base which slightly limits evaluation. BRAIN AND VENTRICLES: No evidence of acute infarct. Remote infarcts within the cerebellum. Additional remote infarct noted in the left frontal lobe. No midline shift. No hydrocephalus. IMPRESSION: 1. No acute infarct.  Limited evaluation as above. 2. 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 Electronically signed by: Donnice Mania MD 01/04/2024 01:52 PM EDT RP Workstation: [id number]   CT HEAD CODE STROKE WO CONTRAST Result Date: 01/04/2024 EXAM: CT HEAD WITHOUT 01/04/2024 11:56:00 AM TECHNIQUE: CT of the head was performed without the administration of intravenous contrast. Automated exposure control, iterative reconstruction, and/or weight based adjustment of the mA/kV was utilized to reduce the radiation dose to as low as reasonably achievable. COMPARISON: CT head 08/16/2023 CLINICAL HISTORY: Neuro deficit, acute, stroke suspected. No contrast; LKW 1045, aphasia, rt side weakness, facial droop FINDINGS: BRAIN AND VENTRICLES: No  acute intracranial hemorrhage. No mass effect or midline shift. No extra-axial fluid collection. No evidence of acute infarct. No hydrocephalus. Similar focus of encephalomalacia in the left frontal lobe compatible with remote infarct. Additional remote infarcts in the left cerebellum and superior right cerebellum. ORBITS: No acute abnormality. SINUSES AND MASTOIDS: No acute abnormality. SOFT TISSUES AND SKULL: No acute skull fracture. No acute soft tissue abnormality. [place] stroke program early CT (ASPECT) score: Ganglionic (caudate, internal capsule, lentiform nucleus, insula, M1-M3): 7 Supraganglionic (M4-M6): 3 Total: 10 IMPRESSION: 1. No acute intracranial abnormality. 2. 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 3. Findings messaged to Dr. Sallyann at 12:16PM on 01/04/24. Electronically signed by: Donnice Mania MD 01/04/2024 12:23 PM EDT RP Workstation: [id number]     Procedures   Medications Ordered in the ED - No data to display                                  Medical Decision Making Social determinants of health: Possible history of homelessness  Cardiac monitor interpretation: Sinus rhythm, no ectopy  Patient arrived as a stroke alert.   She has an right-sided facial droop and right arm weakness and expressive aphasia on initial exam and was drooling.  Symptoms did seem to somewhat improved and her right facial droop seems to be waxing and waning.  Neurology was at bedside on her arrival CT head and MRI brain are unremarkable.  She is still having some expressive aphasia and neurology would like the patient mated for further workup and management.  I discussed the patient's case with hospitalist the patient will be admitted to their service.  Patient is agreeable with the plan.  Problems Addressed: Expressive aphasia: undiagnosed new problem with uncertain prognosis Facial droop: undiagnosed new problem with uncertain prognosis  Amount and/or Complexity of Data Reviewed Independent Historian: EMS    Details: History obtained from EMS regarding last known well and symptom onset as well as symptomatology as patient has aphasia External Data Reviewed: notes.    Details: Prior hospital records reviewed and patient has a history of cardiomyopathy and CHF Labs: ordered. Decision-making details documented in ED Course.    Details: Ordered and reviewed by me and fairly unremarkable Radiology: ordered and independent interpretation performed. Decision-making details documented in ED Course.    Details: Ordered and reviewed by me and discussed with neurology team-patient had a negative CT head and MRI ECG/medicine tests: ordered and independent interpretation performed. Decision-making details documented in ED Course.    Details: Ordered and interpreted by me in the absence of cardiology and shows sinus rhythm, no STEMI no acute change when compared to prior EKG Discussion of management or test interpretation with external provider(s): Dr. Sallyann -neurology-she is on evaluated the patient and recommended that hospice admission but they will consult and recommended further stroke workup  Dr. Tobie -I spoke with her on the phone regarding the  patient she will admit the patient to her service for further workup and management  Risk OTC drugs. Prescription drug management. Decision regarding hospitalization. Diagnosis or treatment significantly limited by social determinants of health. Risk Details: CRITICAL CARE Performed by: Duwaine LITTIE Fusi   Total critical care time: 42 minutes  Critical care time was exclusive of separately billable procedures and treating other patients.  Critical care was necessary to treat or prevent imminent or life-threatening deterioration.  Critical care was time spent personally by me on  the following activities: development of treatment plan with patient and/or surrogate as well as nursing, discussions with consultants, evaluation of patient's response to treatment, examination of patient, obtaining history from patient or surrogate, ordering and performing treatments and interventions, ordering and review of laboratory studies, ordering and review of radiographic studies, pulse oximetry and re-evaluation of patient's condition.   Critical Care Total time providing critical care: 42 minutes     Final diagnoses:  Expressive aphasia  Facial droop    ED Discharge Orders     None          Gennaro Duwaine CROME, DO 01/04/24 1450

## 2024-01-04 NOTE — ED Notes (Signed)
 Pt placed on 2L . MD aware.   Was satting 90. Now 95

## 2024-01-04 NOTE — Telephone Encounter (Signed)
 Advanced Heart Failure Patient Advocate Encounter  Prior authorization for Farxiga  has been submitted and approved. Test billing returns $4 for 90 day supply.  [employer] HAND[employer]ER Effective: 01/03/2024 to 01/02/2025  Rachel DEL, CPhT Rx Patient Advocate Phone: [phone]

## 2024-01-04 NOTE — ED Notes (Signed)
 PT found up walking to bathroom without difficulty.
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
## 2024-01-04 NOTE — H&P (Signed)
 History and Physical    Patient: Molly Howard FMW:[id number] DOB: 22-Sep-1985 DOA: 01/04/2024 DOS: the patient was seen and examined on 01/04/2024 . PCP: [name], DO  Patient coming from: Home Chief complaint: Chief Complaint  Patient presents with   Code Stroke   HPI:  Molly Howard is a 38 y.o. 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  Presenting today with aphasia.  HPI per chart review,She was [facility] the homeless shelter with staff and her girlfriend when she dropped her phone due and EMS was called. She developed difficulty speaking with EMS and a code stroke was activated for aphasia.  Patient is not able to give HPI but does nod yes or no.  ED Course:  Vital signs in the ED were notable for the following:  Vitals:   01/04/24 1208 01/04/24 1302 01/04/24 1307 01/04/24 1446  BP: (!) 164/89   (!) 164/117  Pulse: 95   87  Temp:   97.6 F (36.4 C)   Resp: (!) 35   12  Weight:      SpO2: 96% 99%  96%  TempSrc:   Oral   >>ED evaluation thus far shows: CMP today shows sodium 132 bicarb of 18 glucose 226 BUN of 46 creatinine 1.5 calcium  7.8 albumin  2.2 total protein 6.1 GFR 45. CBC shows anemia with a hemoglobin of 11.1 normal platelets normal white count.   >>While in the ED patient received the following: Medications   stroke: early stages of recovery book (has no administration in time range)  0.9 %  sodium chloride  infusion (has no administration in time range)  acetaminophen  (TYLENOL ) tablet 650 mg (has no administration in time range)    Or  acetaminophen  (TYLENOL ) 160 MG/5ML solution 650 mg (has no administration in time range)    Or  acetaminophen  (TYLENOL ) suppository 650 mg (has no administration in time range)  heparin  injection 5,000 Units (has no  administration in time range)  insulin  aspart (novoLOG ) injection 0-9 Units (has no administration in time range)   Review of Systems  Unable to perform ROS: Other (Aphasia)   Past Medical History:  Diagnosis Date   Acid reflux    Chronic HFrEF (heart failure with reduced ejection fraction) (HCC)    a. 10/2019 Echo: EF 35-40%, GrII DD; b. 05/2020 Echo: EF 20-25%, glob HK; c. 10/2020 Echo: EF 30-35%, glob HK. GrII DD, Mildly red RV fxn. Mod TR; d.  05/2021 cMRI: EF 42%, no LGE. Nl RV size/fxn.   CKD (chronic kidney disease), stage II    Diabetes mellitus (HCC)    H/O medication noncompliance    Hypertension    Microcytic anemia    NICM (nonischemic cardiomyopathy) (HCC)    a. 10/2019 Echo: EF 35-40%; b. 10/2019 MV: No ischemia. Small apical defect-->breast attenuation; c. 05/2020 Echo: EF 20-25%; d. 10/2020 Echo: EF 30-35%, glob HK. GrII DD, Mildly red RV fxn. Mod TR; e. 05/2021 cMRI: EF 42%, no LGE. Nl RV size/fxn.   Obesity    Polysubstance abuse [facility])    Past Surgical History:  Procedure Laterality Date   CHOLECYSTECTOMY     HERNIA REPAIR     ICD IMPLANT     RIGHT HEART CATH N/A 07/28/2022   Procedure: RIGHT HEART CATH;  Surgeon: Darron Deatrice LABOR, MD;  Location: [facility] INVASIVE  CV LAB;  Service: Cardiovascular;  Laterality: N/A;   SUBQ ICD IMPLANT N/A 06/27/2023   Procedure: SUBQ ICD IMPLANT;  Surgeon: Cindie Ole DASEN, MD;  Location: [facility] INVASIVE CV LAB;  Service: Cardiovascular;  Laterality: N/A;    reports that she quit smoking about 3 years ago. Her smoking use included cigarettes. She has never used smokeless tobacco. She reports that she does not currently use alcohol. She reports that she does not currently use drugs after having used the following drugs: Cocaine. Allergies  Allergen Reactions   Other Rash    Lemons    Family History  Problem Relation Age of Onset   Heart failure Mother        Onset of heart failure 40s.  Died in 19-Apr-2022.   Diabetes Mother     Hypertension Father    Diabetes Father    Prior to Admission medications   Medication Sig Start Date End Date Taking? Authorizing Provider  Accu-Chek Softclix Lancets lancets Use as instructed 11/18/22   [name], DO  carvedilol  (COREG ) 12.5 MG tablet Take 1 tablet (12.5 mg total) by mouth 2 (two) times daily with a meal. 10/12/23   Tobie Calix, MD  Continuous Glucose Sensor (FREESTYLE LIBRE 3 SENSOR) MISC Place 1 sensor on the skin every 14 days. Use to check glucose continuously 01/14/23   [name], DO  dapagliflozin  propanediol (FARXIGA ) 10 MG TABS tablet Take 1 tablet (10 mg total) by mouth once daily. 01/03/24   Donette Ellouise LABOR, FNP  digoxin  (LANOXIN ) 0.125 MG tablet Take 0.5 tablets (0.0625 mg total) by mouth daily. 01/03/24   Donette Ellouise LABOR, FNP  ferrous sulfate  325 (65 FE) MG tablet Take 1 tablet (325 mg total) by mouth 2 (two) times daily. 10/12/23 10/11/24  Verena Shawgo, Sona, MD  sacubitril -valsartan  (ENTRESTO ) 97-103 MG Take 1 tablet by mouth 2 (two) times daily. 10/12/23   Syndi Pua, Sona, MD  Semaglutide ,0.25 or 0.5MG /DOS, 2 MG/3ML SOPN Inject 0.25 mg into the skin once a week. 01/03/24   Donette Ellouise LABOR, FNP  spironolactone  (ALDACTONE ) 25 MG tablet Take 1 tablet (25 mg total) by mouth daily. 01/03/24   Donette Ellouise LABOR, FNP  torsemide  (DEMADEX ) 20 MG tablet Take 1 tablet (20 mg total) by mouth daily. 01/03/24   Donette Ellouise LABOR, FNP  Potassium Chloride  40 MEQ/15ML (20%) SOLN Take 40 mEq by mouth 2 (two) times daily. Patient not taking: Reported on 06/07/2023 07/04/20 10/18/20  Darliss Rogue, MD                                                                                 Vitals:   01/04/24 1208 01/04/24 1302 01/04/24 1307 01/04/24 1446  BP: (!) 164/89   (!) 164/117  Pulse: 95   87  Resp: (!) 35   12  Temp:   97.6 F (36.4 C)   TempSrc:   Oral   SpO2: 96% 99%  96%  Weight:       Physical Exam Vitals reviewed.  Constitutional:      General: She is not in acute distress.     Appearance: She is not ill-appearing.  HENT:     Head: Normocephalic and  atraumatic.  Eyes:     Extraocular Movements: Extraocular movements intact.  Cardiovascular:     Rate and Rhythm: Normal rate and regular rhythm.     Pulses: Normal pulses.     Heart sounds: Normal heart sounds.  Pulmonary:     Effort: Pulmonary effort is normal.     Breath sounds: Normal breath sounds.  Abdominal:     General: There is no distension.     Palpations: Abdomen is soft.     Tenderness: There is no abdominal tenderness.  Neurological:     General: No focal deficit present.     Mental Status: She is alert and oriented to person, place, and time.     Cranial Nerves: Dysarthria and facial asymmetry present.     Motor: Weakness present.     Deep Tendon Reflexes:     Reflex Scores:      Bicep reflexes are 1+ on the right side and 1+ on the left side.      Patellar reflexes are 2+ on the right side and 2+ on the left side.    Comments: Decrease strength in right upper extremity.  Psychiatric:        Attention and Perception: Attention normal.        Mood and Affect: Affect normal.        Speech: She is noncommunicative.        Behavior: Behavior normal. Behavior is cooperative.     Labs on Admission: I have personally reviewed following labs and imaging studies CBC: Recent Labs  Lab 01/04/24 1156 01/04/24 1157  WBC 6.1  --   NEUTROABS 4.5  --   HGB 11.1* 12.2  HCT 37.3 36.0  MCV 96.4  --   PLT 327  --    Basic Metabolic Panel: Recent Labs  Lab 01/03/24 0955 01/04/24 1156 01/04/24 1157  NA 135 132* 134*  K 4.8 4.2 4.4  CL 106 103 105  CO2 22 18*  --   GLUCOSE 196* 226* 228*  BUN 45* 46* 40*  CREATININE 1.33* 1.50* 1.50*  CALCIUM  7.8* 7.8*  --    GFR: Estimated Creatinine Clearance: 67.9 mL/min (A) (by C-G formula based on SCr of 1.5 mg/dL (H)). Liver Function Tests: Recent Labs  Lab 01/04/24 1156  AST 25  ALT 28  ALKPHOS 105  BILITOT 0.7  PROT 6.1*  ALBUMIN  2.2*    No results for input(s): LIPASE, AMYLASE in the last 168 hours. No results for input(s): AMMONIA in the last 168 hours. Recent Labs    10/10/23 2209 10/11/23 0534 10/12/23 0356 10/14/23 0704 11/19/23 2205 11/21/23 1124 11/22/23 0616 01/03/24 0955 01/04/24 1156 01/04/24 1157  BUN 43* 40* 37* 46* 36* 33* 38* 45* 46* 40*  CREATININE 1.51* 1.15* 1.22* 1.53* 1.58* 1.42* 1.34* 1.33* 1.50* 1.50*    Estimated Creatinine Clearance: 67.9 mL/min (A) (by C-G formula based on SCr of 1.5 mg/dL (H)).   Recent Labs    10/02/23 0813 10/10/23 2209 10/11/23 0534 10/12/23 0356 10/14/23 0704 11/19/23 2205 11/21/23 1124 11/22/23 0616 01/03/24 0955 01/04/24 1156 01/04/24 1157  BUN 49* 43* 40* 37* 46* 36* 33* 38* 45* 46* 40*  CREATININE 1.42* 1.51* 1.15* 1.22* 1.53* 1.58* 1.42* 1.34* 1.33* 1.50* 1.50*  CO2 27 21* 21* 24 25 22 23  21* 22 18*  --    Cardiac Enzymes: No results for input(s): CKTOTAL, CKMB, CKMBINDEX, TROPONINI in the last 168 hours. BNP (last 3 results) No results for input(s): PROBNP in the last  8760 hours. HbA1C: No results for input(s): HGBA1C in the last 72 hours. CBG: Recent Labs  Lab 01/04/24 1149 01/04/24 1645  GLUCAP 242* 169*   Lipid Profile: Recent Labs    01/03/24 0955  CHOL 231*  HDL 68  LDLCALC 145*  TRIG 92  CHOLHDL 3.4   Thyroid  Function Tests: No results for input(s): TSH, T4TOTAL, FREET4, T3FREE, THYROIDAB in the last 72 hours. Anemia Panel: No results for input(s): VITAMINB12, FOLATE, FERRITIN, TIBC, IRON , RETICCTPCT in the last 72 hours. Urine analysis:    Component Value Date/Time   COLORURINE YELLOW (A) 08/28/2023 1041   APPEARANCEUR HAZY (A) 08/28/2023 1041   LABSPEC 1.012 08/28/2023 1041   PHURINE 5.0 08/28/2023 1041   GLUCOSEU NEGATIVE 08/28/2023 1041   HGBUR MODERATE (A) 08/28/2023 1041   BILIRUBINUR NEGATIVE 08/28/2023 1041   KETONESUR NEGATIVE 08/28/2023 1041   PROTEINUR >=300 (A)  08/28/2023 1041   NITRITE NEGATIVE 08/28/2023 1041   LEUKOCYTESUR TRACE (A) 08/28/2023 1041   Radiological Exams on Admission: DG CHEST PORT 1 VIEW Result Date: 01/04/2024 CLINICAL DATA:  [id number] Stroke [facility]) [id number] EXAM: PORTABLE CHEST - 1 VIEW COMPARISON:  November 21, 2023 FINDINGS: Perihilar airspace opacities in both lungs. No pleural effusion or pneumothorax. Moderate cardiomegaly. Along the left chest wall, there is an AICD with a single lead in the anterior chest adjacent to the sternum. No acute fracture or destructive lesions. IMPRESSION: Moderate cardiomegaly with findings of pulmonary edema. Electronically Signed   By: Rogelia Myers M.D.   On: 01/04/2024 17:17   MR BRAIN WO CONTRAST Result Date: 01/04/2024 EXAM: MRI BRAIN WITHOUT CONTRAST 01/04/2024 12:58:13 PM TECHNIQUE: Multiplanar multisequence MRI of the head/brain was performed without the administration of intravenous contrast. COMPARISON: Same day head CT. CLINICAL HISTORY: Neuro deficit, acute, stroke suspected. Code stroke. FINDINGS: Limited examination patient was unable to continue axial DWI and ADC images were obtained. No additional images are obtained. Additionally, there is artifact along the anterior skull base which slightly limits evaluation. BRAIN AND VENTRICLES: No evidence of acute infarct. Remote infarcts within the cerebellum. Additional remote infarct noted in the left frontal lobe. No midline shift. No hydrocephalus. IMPRESSION: 1. No acute infarct. Limited evaluation as above. 2. 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 Electronically signed by: Donnice Mania MD 01/04/2024 01:52 PM EDT RP Workstation: [id number]   CT HEAD CODE STROKE WO CONTRAST Result Date: 01/04/2024 EXAM: CT HEAD WITHOUT 01/04/2024 11:56:00 AM TECHNIQUE: CT of the head was performed without the administration of intravenous contrast. Automated exposure control, iterative reconstruction, and/or weight based adjustment of the mA/kV was  utilized to reduce the radiation dose to as low as reasonably achievable. COMPARISON: CT head 08/16/2023 CLINICAL HISTORY: Neuro deficit, acute, stroke suspected. No contrast; LKW 1045, aphasia, rt side weakness, facial droop FINDINGS: BRAIN AND VENTRICLES: No acute intracranial hemorrhage. No mass effect or midline shift. No extra-axial fluid collection. No evidence of acute infarct. No hydrocephalus. Similar focus of encephalomalacia in the left frontal lobe compatible with remote infarct. Additional remote infarcts in the left cerebellum and superior right cerebellum. ORBITS: No acute abnormality. SINUSES AND MASTOIDS: No acute abnormality. SOFT TISSUES AND SKULL: No acute skull fracture. No acute soft tissue abnormality. [place] stroke program early CT (ASPECT) score: Ganglionic (caudate, internal capsule, lentiform nucleus, insula, M1-M3): 7 Supraganglionic (M4-M6): 3 Total: 10 IMPRESSION: 1. No acute intracranial abnormality. 2. 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 3. Findings messaged to Dr. Sallyann at 12:16PM on 01/04/24. Electronically  signed by: Donnice Mania MD 01/04/2024 12:23 PM EDT RP Workstation: [id number]   Data Reviewed: Relevant notes from primary care and specialist visits, past discharge summaries as available in EHR, including Care Everywhere . Prior diagnostic testing as pertinent to current admission diagnoses, Updated medications and problem lists for reconciliation .ED course, including vitals, labs, imaging, treatment and response to treatment,Triage notes, nursing and pharmacy notes and ED provider's notes.Notable results as noted in HPI.Discussed case with EDMD/ ED APP/ or Specialty MD on call and as needed.  Assessment & Plan  >> Aphasia/right-sided weakness: Patient has a history of CVA and is at high risk for same.  MRI today is negative for any acute infarct, she has remote infarcts in the left frontal and left cerebellum.  Differentials include TIA or  hypoperfusion related to meds, VTE, cardiac dysrhythmia although her echo did not have PFO documented in the past when she had an echo done in April 2025. -Admit to tele unit. -Cont cardiac and pulse ox monitoring.  -troponin/ BNP/ dimer/ and carotid dopplers.  -will consider chest cta she has elevated dimer in past.  -NPO swallow eval  -bp goal 140's or per neuro.   >>DM II: Glycemic protocol. Accucheck q4.   >> Chronic systolic congestive heart failure: Patient's echocardiogram in April 2025 showed reduced ejection fraction of 30 to 35% with no interatrial septum shunts, global hypokinesis please refer to complete report below.  FINDINGS   Left Ventricle: Left ventricular ejection fraction, by estimation, is 30  to 35%. The left ventricle has moderate to severely decreased function.  The left ventricle demonstrates global hypokinesis. Global longitudinal  strain performed but not reported  based on interpreter judgement due to suboptimal tracking. 3D ejection  fraction reviewed and evaluated as part of the interpretation. Alternate  measurement of EF is felt to be most reflective of LV function. The left  ventricular internal cavity size was  normal in size. There is mild left ventricular hypertrophy. Left  ventricular diastolic parameters are indeterminate.   Right Ventricle: The right ventricular size is normal. No increase in  right ventricular wall thickness. Right ventricular systolic function is  low normal.   Left Atrium: Left atrial size was normal in size.   Right Atrium: Right atrial size was normal in size.   Pericardium: There is no evidence of pericardial effusion.   Mitral Valve: The mitral valve is normal in structure. Mild mitral valve  regurgitation.   Tricuspid Valve: The tricuspid valve is normal in structure. Tricuspid  valve regurgitation is mild to moderate.   Aortic Valve: The aortic valve is tricuspid. Aortic valve regurgitation is  not  visualized. Aortic valve peak gradient measures 6.5 mmHg.   Pulmonic Valve: The pulmonic valve was normal in structure. Pulmonic valve  regurgitation is mild.   Aorta: The aortic root and ascending aorta are structurally normal, with  no evidence of dilitation.   Venous: The inferior vena cava is normal in size with less than 50%  respiratory variability, suggesting right atrial pressure of 8 mmHg.   IAS/Shunts: No atrial level shunt detected by color flow Doppler.  - Strict I's and O's, daily weights. -As needed Lasix . -Currently will hold patient's digoxin , Entresto , torsemide .    >>Essential HTN: Vitals:   01/04/24 1208 01/04/24 1446  BP: (!) 164/89 (!) 164/117  Currently hold all BP.   >> CKD stage IIIa: Lab Results  Component Value Date   CREATININE 1.50 (H) 01/04/2024   CREATININE 1.50 (H) 01/04/2024  CREATININE 1.33 (H) 01/03/2024  Will limit contrast studies and takes CIN prevention measurements.  Renally dose needed medications.   >> Tobacco abuse/COPD: Nicotine patch, as needed albuterol .   >> Hypoxia: At bedside patient noted to have O2 sats at 88%. We will start patient on continuous pulse oximetry, low threshold for CTA overnight.   DVT prophylaxis:  Heparin  Consults:  Neurology  Advance Care Planning:    Code Status: Full Code   Family Communication:  Partner on the phone Disposition Plan:  To be determined Severity of Illness: The appropriate patient status for this patient is INPATIENT. Inpatient status is judged to be reasonable and necessary in order to provide the required intensity of service to ensure the patient's safety. The patient's presenting symptoms, physical exam findings, and initial radiographic and laboratory data in the context of their chronic comorbidities is felt to place them at high risk for further clinical deterioration. Furthermore, it is not anticipated that the patient will be medically stable for discharge from the  hospital within 2 midnights of admission.   * I certify that at the point of admission it is my clinical judgment that the patient will require inpatient hospital care spanning beyond 2 midnights from the point of admission due to high intensity of service, high risk for further deterioration and high frequency of surveillance required.*  Unresulted Labs (From admission, onward)     Start     Ordered   01/05/24 0500  Lipid panel  (Labs)  Tomorrow morning,   R       Comments: Fasting    01/04/24 1502   01/04/24 1634  Brain natriuretic peptide  Add-on,   AD        01/04/24 1633            Meds ordered this encounter  Medications   DISCONTD: tenecteplase  (TNKASE ) injection for Stroke 25 mg    stroke: early stages of recovery book   0.9 %  sodium chloride  infusion   OR Linked Order Group    acetaminophen  (TYLENOL ) tablet 650 mg    acetaminophen  (TYLENOL ) 160 MG/5ML solution 650 mg    acetaminophen  (TYLENOL ) suppository 650 mg   heparin  injection 5,000 Units   insulin  aspart (novoLOG ) injection 0-9 Units    Correction coverage::   Sensitive (thin, NPO, renal)    CBG < 70::   Implement Hypoglycemia Standing Orders and refer to Hypoglycemia Standing Orders sidebar report    CBG 70 - 120::   0 units    CBG 121 - 150::   1 unit    CBG 151 - 200::   2 units    CBG 201 - 250::   3 units    CBG 251 - 300::   5 units    CBG 301 - 350::   7 units    CBG 351 - 400:   9 units    CBG > 400:   call MD and obtain STAT lab verification     Orders Placed This Encounter  Procedures   CT HEAD CODE STROKE WO CONTRAST   MR BRAIN WO CONTRAST   DG CHEST PORT 1 VIEW   CT ANGIO HEAD NECK W WO CM   NM Pulmonary Perfusion   Protime-INR   APTT   CBC   Differential   Comprehensive metabolic panel   Ethanol   hCG, serum, qualitative   Lipid panel   Brain natriuretic peptide   D-dimer, quantitative   Diet NPO time specified  ED Cardiac monitoring   NIH Stroke Scale   If O2 sat <94%  Administer O2 @ 2 Liters/Minute   NIHSS score documentation NIHSS score range: 0-42   Vital signs   Notify physician (specify)   OOB with assistance   Activity as tolerated   Swallow screen - If patient does NOT pass this screen, place order for SLP eval and treat (SLP2) - swallowing evaluation (BSE, MBS and/or diet order as indicated)   NIH Stroke Scale   Intake and output   Cardiac Monitoring Continuous x 24 hours Indications for use: Acute neurological event   Apply Stroke Care Plan: Ischemic Stroke, TIA   Initiate Adult Central Line Maintenance and Catheter Protocol for patients with central line (CVC, PICC, Port, Hemodialysis, Trialysis)   Discuss with patient and document patient's goals for stroke risk factor reduction   Initiate Oral Care Protocol   Initiate Carrier Fluid Protocol   Provide stroke education material to patient and family.   Nurse to provide smoking / tobacco cessation education   If the patient has passed the Stroke Swallow Screen or has a feeding tube, then RN may order General Admission PRN Orders (through manage orders) for the following patient needs: allergy symptoms (Claritin), cold sores (Carmex), cough (Robitussin DM), eye irritation (Liquifilm Tears), hemorrhoids (Tucks), indigestion (Maalox), minor skin irritation (hydrocortisone cream), muscle pain Lucienne Gay), nose irritation (saline nasal spray) and sore throat (Chloraseptic spray).   Apply Diabetes Mellitus Care Plan   STAT CBG when hypoglycemia is suspected. If treated, recheck every 15 minutes after each treatment until CBG >/= 70 mg/dl   Refer to Hypoglycemia Protocol Sidebar Report for treatment of CBG < 70 mg/dl   No HS correction Insulin    Full code   Consult to hospitalist   Consult to Registered Dietitian   Consult to Transition of Care Team   OT eval and treat   PT eval and treat   ED Pulse oximetry, continuous   Oxygen therapy Mode or (Route): Nasal cannula; Liters Per Minute: 2; Keep O2  saturation between: greater than 94 %   SLP eval and treat Reason for evaluation: Cognitive/Language evaluation   SLP eval and treat Reason for evaluation: .Swallowing evaluation (BSE, MBS and/or diet order as indicated)   I-stat chem 8, ED   CBG monitoring, ED   I-Stat CG4 Lactic Acid   CBG monitoring, ED   ED EKG   Admit to Inpatient (patient's expected length of stay will be greater than 2 midnights or inpatient only procedure)   Fall precautions   Aspiration precautions    Author: Mario LULLA Blanch, MD 12 pm- 8 pm. [facility]. 01/04/2024 5:41 PM Please note for any communication after hours contact [facility] Assigned provider on call on Amion.

## 2024-01-04 NOTE — ED Notes (Signed)
 Pt refusing to finish MRI - shaking her head intently and acting panicked. Shook head when medication was offered as well. Scan discontinued.

## 2024-01-04 NOTE — ED Triage Notes (Signed)
 Pt arrived via [facility] with CC of code stroke. LSN 1045 - Sudden onset RUE weakness/numbness - Difficulty swelling, tongue deviation, expressive aphasia/unable to speak - R sided facial droop.   On immediate assessment by myself no weakness was found. Difficulty swallowing, speaking, and tongue deviation/swelling continues.   Not on thinners, hx diabetes.   Coming from homeless shelter.        TIMESTAMPS 1150 arrival to bridge - 1155 in cat scan - 1215 in room - 1230 in MRI -1300 back to ED room  Scans so far at this time show no obvious stroke. As per [place] stroke responde nurse there is no need for addition NIH assessments going forward. Standard assessment process with be sufficient.

## 2024-01-04 NOTE — ED Notes (Signed)
 Patel notified of failed swallow screen and NIH.

## 2024-01-04 NOTE — ED Notes (Addendum)
 ICD can not be interrogated as per [employer].  Provider notified and notified of increased pt secretions.

## 2024-01-04 NOTE — ED Notes (Signed)
 CCMD called.

## 2024-01-04 NOTE — ED Notes (Signed)
 The patients pacemaker is ineligible for remote interrogation - [employer] contacted to send rep out.

## 2024-01-04 NOTE — Code Documentation (Signed)
 Stroke Response Nurse Documentation Code Documentation  Molly Howard is a 38 y.o. female arriving to [facility]  via [facility] on 01/04/2024 with past medical hx of DM CKD, heart failure.. On No antithrombotic. Code stroke was activated by EMS.   [facility] from [facility] where she was LKW at 12 and now complaining of aphasia. Apparently she was seen to drop her phone with her right hand, and was noted to be drooling from right side of mouth and unable to speak.  Stroke team at the bedside on patient arrival. Labs drawn and patient cleared for CT by Dr. Pamella. Patient to CT with team. NIHSS 8, see documentation for details and code stroke times. Patient with disoriented, right facial droop, and Expressive aphasia  on exam. The following imaging was completed:  CT Head and MRI. Patient is not a candidate for IV Thrombolytic due to stroke not suspected.. Patient is not a candidate for IR due to stroke not suspected.     Process Delays Noted: delay as pt unable to answer questions and had to find a surrogate.  Bedside handoff with ED RN Adelita.    Dakhari Zuver Livengood  Stroke Response RN

## 2024-01-05 ENCOUNTER — Inpatient Hospital Stay (HOSPITAL_COMMUNITY): Payer: MEDICAID

## 2024-01-05 ENCOUNTER — Encounter: Payer: MEDICAID | Admitting: Family

## 2024-01-05 ENCOUNTER — Encounter (HOSPITAL_COMMUNITY): Payer: Self-pay | Admitting: Internal Medicine

## 2024-01-05 ENCOUNTER — Encounter: Admitting: Family

## 2024-01-05 DIAGNOSIS — R531 Weakness: Secondary | ICD-10-CM | POA: Diagnosis not present

## 2024-01-05 DIAGNOSIS — R7989 Other specified abnormal findings of blood chemistry: Secondary | ICD-10-CM | POA: Diagnosis not present

## 2024-01-05 DIAGNOSIS — R4701 Aphasia: Secondary | ICD-10-CM | POA: Diagnosis not present

## 2024-01-05 LAB — GLUCOSE, CAPILLARY
Glucose-Capillary: 120 mg/dL — ABNORMAL HIGH (ref 70–99)
Glucose-Capillary: 121 mg/dL — ABNORMAL HIGH (ref 70–99)
Glucose-Capillary: 138 mg/dL — ABNORMAL HIGH (ref 70–99)
Glucose-Capillary: 140 mg/dL — ABNORMAL HIGH (ref 70–99)
Glucose-Capillary: 165 mg/dL — ABNORMAL HIGH (ref 70–99)
Glucose-Capillary: 192 mg/dL — ABNORMAL HIGH (ref 70–99)

## 2024-01-05 LAB — RAPID URINE DRUG SCREEN, HOSP PERFORMED
Amphetamines: NOT DETECTED
Barbiturates: NOT DETECTED
Benzodiazepines: NOT DETECTED
Cocaine: NOT DETECTED
Opiates: NOT DETECTED
Tetrahydrocannabinol: NOT DETECTED

## 2024-01-05 LAB — LIPID PANEL
Cholesterol: 241 mg/dL — ABNORMAL HIGH (ref 0–200)
HDL: 52 mg/dL (ref >40)
LDL Cholesterol: 166 mg/dL — ABNORMAL HIGH (ref 0–99)
Total CHOL/HDL Ratio: 4.6 ratio
Triglycerides: 115 mg/dL (ref <150)
VLDL: 23 mg/dL (ref 0–40)

## 2024-01-05 MED ORDER — DAPAGLIFLOZIN PROPANEDIOL 10 MG PO TABS
10.0000 mg | ORAL_TABLET | Freq: Every day | ORAL | Status: DC
  Administered 2024-01-05 – 2024-01-06 (×2): 10 mg via ORAL
  Filled 2024-01-05 (×2): qty 1

## 2024-01-05 MED ORDER — INSULIN ASPART 100 UNIT/ML IJ SOLN: 0.0000 [IU] | Freq: Three times a day (TID) | INTRAMUSCULAR | Status: DC

## 2024-01-05 MED ORDER — SACUBITRIL-VALSARTAN 24-26 MG PO TABS
1.0000 | ORAL_TABLET | Freq: Two times a day (BID) | ORAL | Status: DC
  Administered 2024-01-05 (×2): 1 via ORAL
  Filled 2024-01-05 (×3): qty 1

## 2024-01-05 MED ORDER — CARVEDILOL 12.5 MG PO TABS
12.5000 mg | ORAL_TABLET | Freq: Two times a day (BID) | ORAL | Status: DC
  Administered 2024-01-05 – 2024-01-06 (×2): 12.5 mg via ORAL
  Filled 2024-01-05 (×2): qty 1

## 2024-01-05 MED ORDER — ADULT MULTIVITAMIN W/MINERALS CH
1.0000 | ORAL_TABLET | Freq: Every day | ORAL | Status: DC
  Administered 2024-01-05 – 2024-01-06 (×2): 1 via ORAL
  Filled 2024-01-05 (×2): qty 1

## 2024-01-05 MED ORDER — DIGOXIN 125 MCG PO TABS
0.0625 mg | ORAL_TABLET | Freq: Every day | ORAL | Status: DC
Start: 2024-01-05 — End: 2024-01-06
  Administered 2024-01-05 – 2024-01-06 (×2): 0.0625 mg via ORAL
  Filled 2024-01-05 (×2): qty 1

## 2024-01-05 MED ORDER — SPIRONOLACTONE 25 MG PO TABS
25.0000 mg | ORAL_TABLET | Freq: Every day | ORAL | Status: DC
Start: 2024-01-05 — End: 2024-01-06
  Administered 2024-01-05 – 2024-01-06 (×2): 25 mg via ORAL
  Filled 2024-01-05 (×2): qty 1

## 2024-01-05 MED ORDER — INSULIN ASPART 100 UNIT/ML IJ SOLN: 0.0000 [IU] | Freq: Every day | INTRAMUSCULAR | Status: DC

## 2024-01-05 MED ORDER — TECHNETIUM TO 99M ALBUMIN AGGREGATED
4.3000 | Freq: Once | INTRAVENOUS | Status: AC | PRN
  Administered 2024-01-05: 4.3 via INTRAVENOUS

## 2024-01-05 MED ORDER — TORSEMIDE 20 MG PO TABS
20.0000 mg | ORAL_TABLET | Freq: Every day | ORAL | Status: DC
  Administered 2024-01-05 – 2024-01-06 (×2): 20 mg via ORAL
  Filled 2024-01-05 (×2): qty 1

## 2024-01-05 NOTE — Progress Notes (Signed)
 Bilateral lower extremity venous duplex has been completed.  Results can be found in chart review under CV Proc.  01/05/2024 4:42 PM  Tamyrah Burbage Elden Appl, RVT.

## 2024-01-05 NOTE — Progress Notes (Signed)
 01/05/24 1200  Spiritual Encounters  Type of Visit Initial  Care provided to: Pt and family  Referral source Nurse (RN/NT/LPN)  Reason for visit Advance directives  OnCall Visit No    Chaplain responded to consult request for HCPOA. The patient was eating her breakfast at the time. She was non-verbal, but she was able to understand the document. Her girlfriend and sister were at the bedside.   Chaplain provided the Advance Directive packet as well as education on Advance Directives-documents an individual completes to communicate their health care directions in advance of a time when they may need them. Chaplain informed pt the documents which may be completed here in the hospital are the Living Will and Health Care Power of [place].  Chaplain informed that the Health Care Power of [place] is a legal document in which an individual names another person, their Health Care Agent, to make health care decisions when the individual is not able to make them for themselves. The Health Care Agent's function can be temporary or permanent depending on the pt's ability to make and communicate those decisions independently. Chaplain informed pt in the absence of a Health Care Power of Attorney, the state of [place]  directs health care providers to look to the following individuals in the order listed: legal guardian; an attorney?in?fact under a general power of attorney (POA) if that POA includes the right to make health care decisions; a husband or wife; a majority of parents and adult children; a majority of adult brothers and sisters; or an individual who has an established relationship with you, who is acting in good faith and who can convey your wishes.  If none of these person are available or willing to make medical decisions on a patient's behalf, the law allows the patient's doctor to make decisions for them as long as another doctor agrees with those decisions.  Chaplain also informed the  patient that the Health Care agent has no decision-making authority over any affairs other than those related to his or her medical care.  The chaplain further educated the pt that a Living Will is a legal document that allows an individual to state his or her desire not to receive life-prolonging measures in the event that they have a condition that is incurable and will result in their death in a short period of time; they are unconscious, and doctors are confident that they will not regain consciousness; and/or they have advanced dementia or other substantial and irreversible loss of mental function. The chaplain informed pt that life-prolonging measures are medical treatments that would only serve to postpone death, including breathing machines, kidney dialysis, antibiotics, artificial nutrition and hydration (tube feeding), and similar forms of treatment and that if an individual is able to express their wishes, they may also make them known without the use of a Living Will, but in the event that an individual is not able to express their wishes themselves, a Living Will allows medical providers and the pt's family and friends ensure that they are not making decisions on the pt's behalf, but rather serving as the pt's voice to convey decisions the pt has already made.  The patient is aware that the decision to create an advance directive is theirs alone and they may chose not to complete the documents or may chose to complete one portion or both.  The patient was informed that they can revoke the documents at any time by striking through them and writing void or by  completing new documents, but that it is also advisable that the individual verbally notify interested parties that their wishes have changed.  They are also aware that the document must be signed in the presence of a notary public and two witnesses and that this can be done while the patient is still admitted to the hospital or after discharge  in the community. If they decide to complete Advance Directives after being discharged from the hospital, they have been advised to notify all interested parties and to provide those documents to their physicians and loved ones in addition to bringing them to the hospital in the event of another hospitalization.  The chaplain informed the pt that if they desire to proceed with completing Advance Directive Documentation while they are still admitted, notary services are typically available at [facility] between the hours of 8:30 and 3:30 Monday-Friday.    When the patient is ready to have these documents completed, the patient should request that their nurse place a spiritual care consult and indicate that the patient is ready to have their advance directives notarized so that arrangements for witnesses and notary public can be made.  Please page spiritual care if the patient desires further education or has questions.   M.Kubra Susanna Kerry Resident [phone]

## 2024-01-05 NOTE — Evaluation (Signed)
 Clinical/Bedside Swallow Evaluation Patient Details  Name: Molly Howard MRN: [id number] Date of Birth: 06-Jun-1985  Today's Date: 01/05/2024 Time: SLP Start Time (ACUTE ONLY): 0831 SLP Stop Time (ACUTE ONLY): 0847 SLP Time Calculation (min) (ACUTE ONLY): 16 min  Past Medical History:  Past Medical History:  Diagnosis Date   Acid reflux    Chronic HFrEF (heart failure with reduced ejection fraction) (HCC)    a. 10/2019 Echo: EF 35-40%, GrII DD; b. 05/2020 Echo: EF 20-25%, glob HK; c. 10/2020 Echo: EF 30-35%, glob HK. GrII DD, Mildly red RV fxn. Mod TR; d.  05/2021 cMRI: EF 42%, no LGE. Nl RV size/fxn.   CKD (chronic kidney disease), stage II    Diabetes mellitus (HCC)    H/O medication noncompliance    Hypertension    Microcytic anemia    NICM (nonischemic cardiomyopathy) (HCC)    a. 10/2019 Echo: EF 35-40%; b. 10/2019 MV: No ischemia. Small apical defect-->breast attenuation; c. 05/2020 Echo: EF 20-25%; d. 10/2020 Echo: EF 30-35%, glob HK. GrII DD, Mildly red RV fxn. Mod TR; e. 05/2021 cMRI: EF 42%, no LGE. Nl RV size/fxn.   Obesity    Polysubstance abuse [facility])    Past Surgical History:  Past Surgical History:  Procedure Laterality Date   CHOLECYSTECTOMY     HERNIA REPAIR     ICD IMPLANT     RIGHT HEART CATH N/A 07/28/2022   Procedure: RIGHT HEART CATH;  Surgeon: Darron Deatrice LABOR, MD;  Location: [facility] INVASIVE CV LAB;  Service: Cardiovascular;  Laterality: N/A;   SUBQ ICD IMPLANT N/A 06/27/2023   Procedure: SUBQ ICD IMPLANT;  Surgeon: Cindie Ole DASEN, MD;  Location: [facility] INVASIVE CV LAB;  Service: Cardiovascular;  Laterality: N/A;   HPI:  Molly Howard is a 38 y.o. female who presented to the ED with aphasia.  MRI 9/3 negative for acute findings.  CXR 9/3 suggestive of pulmonary edema.  Pt with past medical history of of chronic combined HFrEF and HFpEF secondary to nonischemic cardiomyopathy, status post AICD 2/25, HTN, IIDM, CKD stage IIIa, polysubstance abuse,last admitted in July  2025,for acute on chronic systolic chf.    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  Dysarthria is also suspected given R facial weakness on OME.  Expressive phasia is not excluded. She endoreses that she does know what she wants to say, but is unable to form the words.  Pt's receptive language is much stronger than expressive, but some difficulty observed with commands. Pt was assessed using portions of the Western aphasia battery bedside form (see below for additional information).  No speech was observed during today's session.  Pt was unable to engage in any social greetings, state her name, or complete choral counting task.  With singing task there was some increased vocalization and articulatory movements L>R.  Pt was unable to complete reptetition task with verbal and visual cuing.  With confrontational naming task, pt was 0% accurate.  She was able to demonstrate use of any objects for which this was appropriate (eg spoon, telephone, as compared to purple or finger).  She demonstrated nonverbal communication, pointing out light switches in room when SLP arrived and pantomimed during naming task.  Pt answered yes no questions with 90-100% accuracy (one head gesture could not be determined to be a nod or shake).  Pt completed sequential commands task wiht 50% accuracy. Pt demonstrated ability  to complete pretend gestures with BUE (knocking on door, brushing teeth). It was difficulty to determine presence of oral apraxia v facial weakness, but she does appear to have more labial movement with these tasks than with her attempts at speech.  Pt may benefit from AAC if deficits do not resolve.  SLP will follow in house and recommend ST at next level of care.  Western Aphasia Battery - Bedside Form Content: 0/10 Fluency: 0/10 Yes/No: 9/10 Sequential Commands: 5/10 Repetition:  0/10 Naming: 0/10 Bedside Aphasia Score: 23/10     SLP Visit Diagnosis: Apraxia (R48.2);Dysarthria and anarthria (R47.1);Aphasia (R47.01)    Aspiration Risk  Mild aspiration risk    Diet Recommendation Regular;Thin liquid    Liquid Administration via: Cup;Straw Medication Administration: Whole meds with liquid Supervision: Patient able to self feed (assist prn) Compensations: Slow rate;Small sips/bites;Lingual sweep for clearance of pocketing Postural Changes: Seated upright at 90 degrees    Other  Recommendations Oral Care Recommendations: Oral care BID     Assistance Recommended at Discharge    Functional Status Assessment Patient has had a recent decline in their functional status and demonstrates the ability to make significant improvements in function in a reasonable and predictable amount of time.  Frequency and Duration min 2x/week  2 weeks       Prognosis Prognosis for improved oropharyngeal function: Good      Swallow Study   General Date of Onset: 01/04/24 HPI: Molly Howard is a 38 y.o. female who presented to the ED with aphasia.  MRI 9/3 negative for acute findings.  CXR 9/3 suggestive of pulmonary edema.  Pt with past medical history of of chronic combined HFrEF and HFpEF secondary to nonischemic cardiomyopathy, status post AICD 2/25, HTN, IIDM, CKD stage IIIa, polysubstance abuse,last admitted in July 2025,for acute on chronic systolic chf. Type of Study: Bedside Swallow Evaluation Previous Swallow Assessment: None Diet Prior to this Study: NPO Temperature Spikes Noted: No Respiratory Status: Room air History of Recent Intubation: No Behavior/Cognition: Cooperative;Alert;Pleasant mood Oral Cavity Assessment: Within Functional Limits Oral Care Completed by SLP: No Oral Cavity - Dentition: Adequate natural dentition;Dentures, bottom (partial) Vision: Functional for self-feeding Patient Positioning: Upright in bed (edge of bed) Baseline Vocal Quality: Not  observed Volitional Cough: Cognitively unable to elicit Volitional Swallow: Unable to elicit    Oral/Motor/Sensory Function Overall Oral Motor/Sensory Function: Moderate impairment Facial ROM: Reduced right Facial Symmetry: Abnormal symmetry right Facial Strength: Reduced right Facial Sensation: Within Functional Limits Lingual ROM: Reduced left Lingual Symmetry: Abnormal symmetry right Lingual Strength: Reduced Velum:  (reduced elevation) Mandible: Within Functional Limits   Ice Chips Ice chips: Not tested   Thin Liquid Thin Liquid: Within functional limits Presentation: Cup;Straw    Nectar Thick Nectar Thick Liquid: Not tested   Honey Thick Honey Thick Liquid: Not tested   Puree Puree: Within functional limits Presentation: Spoon   Solid     Solid: Impaired Oral Phase Functional Implications: Oral residue;Right anterior spillage (very very trace)      Molly FORBES Grippe, MA, CCC-SLP Acute Rehabilitation Services Office: [phone] 01/05/2024,9:19 AM
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
## 2024-01-05 NOTE — Progress Notes (Signed)
 Initial Nutrition Assessment  DOCUMENTATION CODES:   Morbid obesity  INTERVENTION:   -2 gram sodium diet -MVI with minerals daily -Magic cup BID with meals, each supplement provides 290 kcal and 9 grams of protein  -RD provided Low Sodium Nutrition Therapy handout from [facility] Care [facility]; attached to AVS/ discharge summary   NUTRITION DIAGNOSIS:   Increased nutrient needs related to chronic illness (CHF) as evidenced by estimated needs.  GOAL:   Patient will meet greater than or equal to 90% of their needs  MONITOR:   PO intake  REASON FOR ASSESSMENT:   Consult Assessment of nutrition requirement/status  ASSESSMENT:   Pt with past medical history  of  of chronic combined HFrEF and HFpEF secondary to nonischemic cardiomyopathy, status post AICD 2/25, HTN, IIDM, CKD stage IIIa, polysubstance abuse, last admitted in July 2025, for acute on chronic systolic CHF. Presenting with aphasia.  Pt admitted with aphasia and rt sided weakness.   9/3- bedside swallow screen completed by RN; unsafe for oral intake at this time 9/4- s/p BSE- regular diet with thin liquids  Reviewed I/O's: +82 ml x 24 hours  Pt unavailable at time of visit. Attempted to speak with pt via call to hospital room phone, however, unable to reach. RD unable to obtain further nutrition-related history or complete nutrition-focused physical exam at this time.    Per H&P, MRI negative for acute infarcts, however, has remote infarcts in lt front and lt cerebellum. Code stroke is called for aphasia.   Per SLP notes, pt remains with aphasia, but able to answer yes and no questions accurately with head nods.   No wt loss noted over the past 3 months; noted progressive wt gain. Per nursing assessment, pt with bilateral extremity edema, which may be contributing to weight gain as well as masking true weight loss as well as fat and muscle depletions.   Pt seen by this RD during hospitalization in 11/2023;  pt endorsed diet and medication noncompliance at that visit secondary to difficulty affording medications due to cost and being lazy in terms of lifestyle changes.   Obesity is a complex, chronic medical condition that is optimally managed by a multidisciplinary care team. Weight loss is not an ideal goal for an acute inpatient hospitalization. However, if further work-up for obesity is warranted, consider outpatient referral to [facility].    Pt followed by [facility] at [facility].   Medications reviewed.   Lab Results  Component Value Date   HGBA1C 6.7 (H) 08/06/2023   PTA DM medications are 10 mg farxiga  daily and 0.25 semaglutide  weekly. Pt a;lso uses a Freestyle Libre 3 CGM PTA.   Labs reviewed: CBGS: 120-169 (inpatient orders for glycemic control are none). Tox screen negative on 01/03/24.   Diet Order:   Diet Order             Diet regular Fluid consistency: Thin  Diet effective now                   EDUCATION NEEDS:   No education needs have been identified at this time  Skin:  Skin Assessment: Reviewed RN Assessment  Last BM:  Unknown  Height:   Ht Readings from Last 1 Encounters:  01/04/24 5' 7 (1.702 m)    Weight:   Wt Readings from Last 1 Encounters:  01/04/24 121.2 kg    Ideal Body Weight:  61.4 kg  BMI:  Body mass index  is 41.85 kg/m.  Estimated Nutritional Needs:   Kcal:  1850-2050  Protein:  100-115 grams  Fluid:  1.8-2.0 L    Margery ORN, RD, LDN, CDCES Registered Dietitian III Certified Diabetes Care and Education Specialist If unable to reach this RD, please use RD Inpatient group chat on secure chat between hours of 8am-4 pm daily

## 2024-01-05 NOTE — Evaluation (Addendum)
 Physical Therapy Evaluation Patient Details Name: Molly Howard MRN: [id number] DOB: 07/06/1985 Today's Date: 01/05/2024  History of Present Illness  Pt is a 38 year old woman admitted on 01/04/24 with aphasia and R side weakness. Brain imaging negative for acute findings. CTX suggestive of pulmonary edema. 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  Clinical Impression  Pt admitted with above diagnosis. Previously independent. Transfers and ambulates at a supervision level today without over loss of balance or buckling. Demonstrates mild instability with higher level dynamic challenges (DGI indicates elevated fall risk.) Pt did not require physical assist during evaluation today. Good family support. May benefit from OPPT neuro rehab to improve dynamic stability and gait. Pt currently with functional limitations due to the deficits listed below (see PT Problem List). Pt will benefit from acute skilled PT to increase their independence and safety with mobility to allow discharge.           If plan is discharge home, recommend the following: Assistance with cooking/housework;Direct supervision/assist for medications management;Direct supervision/assist for financial management;Assist for transportation   Can travel by private vehicle        Equipment Recommendations None recommended by PT  Recommendations for Other Services       Functional Status Assessment Patient has had a recent decline in their functional status and demonstrates the ability to make significant improvements in function in a reasonable and predictable amount of time.     Precautions / Restrictions Precautions Precautions: Fall Recall of Precautions/Restrictions: Intact Restrictions Weight Bearing Restrictions Per Provider Order: No      Mobility  Bed Mobility               General bed mobility comments:  received and returned to EOB    Transfers Overall transfer level: Needs assistance Equipment used: None Transfers: Sit to/from Stand Sit to Stand: Supervision           General transfer comment: supervision for safety and IV line. No overt instability noted    Ambulation/Gait Ambulation/Gait assistance: Supervision Gait Distance (Feet): 175 Feet Assistive device: None Gait Pattern/deviations: Step-through pattern, Drifts right/left Gait velocity: dec Gait velocity interpretation: <1.8 ft/sec, indicate of risk for recurrent falls   General Gait Details: MInimal instability noted, occasional drift with dynamic challenges. Some difficulty following commands (turn left/ turn right, stop, etc.) Educated on safety and awareness. No overt buckling noted. No LOB requiring physical assist to correct. Pt nods her head yes when asked if she feels like she is ambulating at her baseline level.  Stairs            Wheelchair Mobility     Tilt Bed    Modified Rankin (Stroke Patients Only)       Balance Overall balance assessment: Needs assistance Sitting-balance support: No upper extremity supported Sitting balance-Leahy Scale: Good     Standing balance support: No upper extremity supported, During functional activity Standing balance-Leahy Scale: Fair                   Standardized Balance Assessment Standardized Balance Assessment : Dynamic Gait Index   Dynamic Gait Index Level Surface: Normal Change in Gait Speed: Normal Gait with Horizontal Head Turns: Normal Gait with Vertical Head Turns: Mild Impairment Gait and Pivot Turn: Mild Impairment Step Over Obstacle: Moderate Impairment Step Around Obstacles: Normal Steps: Mild Impairment Total Score: 19       Pertinent  Vitals/Pain Pain Assessment Pain Assessment: No/denies pain    Home Living Family/patient expects to be discharged to:: Shelter/Homeless     Type of Home: Homeless                   Prior Function Prior Level of Function : Independent/Modified Independent                     Extremity/Trunk Assessment   Upper Extremity Assessment Upper Extremity Assessment: Defer to OT evaluation RUE Deficits / Details: 4-/5 strength in shoulder, 4/5 elbow, 4-/5 hand RUE Sensation:  (indicates difference in sensation in R UE vs L) RUE Coordination: decreased fine motor;decreased gross motor    Lower Extremity Assessment Lower Extremity Assessment: Defer to PT evaluation    Cervical / Trunk Assessment Cervical / Trunk Assessment: Normal  Communication   Communication Communication: Impaired Factors Affecting Communication: Difficulty expressing self;Other (comment) (questionable receptive deficits)    Cognition Arousal: Alert Behavior During Therapy: Flat affect   PT - Cognitive impairments: Difficult to assess, Sequencing, Problem solving Difficult to assess due to: Impaired communication                     PT - Cognition Comments: Difficulty following certain diretions (turn left/right, step over this item.) Following commands: Impaired Following commands impaired: Follows one step commands with increased time, Follows one step commands inconsistently     Cueing Cueing Techniques: Verbal cues, Gestural cues     General Comments General comments (skin integrity, edema, etc.): SpO2 87-88% on RA while ambulating, increased to 93% with seated break and 2L O2 applied. aphasic. family and SO preseent    Exercises     Assessment/Plan    PT Assessment Patient needs continued PT services  PT Problem List Decreased activity tolerance;Decreased balance;Decreased mobility;Decreased cognition;Decreased coordination;Cardiopulmonary status limiting activity;Obesity       PT Treatment Interventions DME instruction;Gait training;Stair training;Functional mobility training;Therapeutic activities;Therapeutic exercise;Neuromuscular re-education;Balance  training;Patient/family education;Cognitive remediation    PT Goals (Current goals can be found in the Care Plan section)  Acute Rehab PT Goals Patient Stated Goal: none stated PT Goal Formulation: With patient/family Time For Goal Achievement: 01/19/24 Potential to Achieve Goals: Good    Frequency Min 2X/week     Co-evaluation               AM-PAC PT 6 Clicks Mobility  Outcome Measure Help needed turning from your back to your side while in a flat bed without using bedrails?: None Help needed moving from lying on your back to sitting on the side of a flat bed without using bedrails?: None Help needed moving to and from a bed to a chair (including a wheelchair)?: A Little Help needed standing up from a chair using your arms (e.g., wheelchair or bedside chair)?: A Little Help needed to walk in hospital room?: A Little Help needed climbing 3-5 steps with a railing? : A Little 6 Click Score: 20    End of Session Equipment Utilized During Treatment: Gait belt Activity Tolerance: Patient tolerated treatment well Patient left: with call bell/phone within reach;in bed;with family/visitor present Nurse Communication: Mobility status PT Visit Diagnosis: Unsteadiness on feet (R26.81);Other abnormalities of gait and mobility (R26.89);Other symptoms and signs involving the nervous system (R29.898)    Time: 8863-8855 PT Time Calculation (min) (ACUTE ONLY): 8 min   Charges:   PT Evaluation $PT Eval Low Complexity: 1 Low   PT General Charges $$ ACUTE PT VISIT: 1  Visit    Addendum for start time (Updated. Now correct.)     Leontine Roads, PT, DPT [facility] Physical Therapist Office: [phone] Website: [email or web address]   Leontine GORMAN Roads 01/05/2024, 2:13 PM

## 2024-01-05 NOTE — Discharge Instructions (Signed)

## 2024-01-05 NOTE — Plan of Care (Signed)

## 2024-01-05 NOTE — Plan of Care (Signed)
  Problem: Skin Integrity: Goal: Risk for impaired skin integrity will decrease Outcome: Progressing   Problem: Education: Goal: Knowledge of General Education information will improve Description: Including pain rating scale, medication(s)/side effects and non-pharmacologic comfort measures Outcome: Progressing   Problem: Elimination: Goal: Will not experience complications related to bowel motility Outcome: Progressing

## 2024-01-05 NOTE — Evaluation (Signed)
 Clinical/Bedside Swallow Evaluation Patient Details  Name: Molly Howard MRN: [id number] Date of Birth: 1985-05-08  Today's Date: 01/05/2024 Time: SLP Start Time (ACUTE ONLY): 9175 SLP Stop Time (ACUTE ONLY): 0831 SLP Time Calculation (min) (ACUTE ONLY): 7 min  Past Medical History:  Past Medical History:  Diagnosis Date   Acid reflux    Chronic HFrEF (heart failure with reduced ejection fraction) (HCC)    a. 10/2019 Echo: EF 35-40%, GrII DD; b. 05/2020 Echo: EF 20-25%, glob HK; c. 10/2020 Echo: EF 30-35%, glob HK. GrII DD, Mildly red RV fxn. Mod TR; d.  05/2021 cMRI: EF 42%, no LGE. Nl RV size/fxn.   CKD (chronic kidney disease), stage II    Diabetes mellitus (HCC)    H/O medication noncompliance    Hypertension    Microcytic anemia    NICM (nonischemic cardiomyopathy) (HCC)    a. 10/2019 Echo: EF 35-40%; b. 10/2019 MV: No ischemia. Small apical defect-->breast attenuation; c. 05/2020 Echo: EF 20-25%; d. 10/2020 Echo: EF 30-35%, glob HK. GrII DD, Mildly red RV fxn. Mod TR; e. 05/2021 cMRI: EF 42%, no LGE. Nl RV size/fxn.   Obesity    Polysubstance abuse [facility])    Past Surgical History:  Past Surgical History:  Procedure Laterality Date   CHOLECYSTECTOMY     HERNIA REPAIR     ICD IMPLANT     RIGHT HEART CATH N/A 07/28/2022   Procedure: RIGHT HEART CATH;  Surgeon: Darron Deatrice LABOR, MD;  Location: [facility] INVASIVE CV LAB;  Service: Cardiovascular;  Laterality: N/A;   SUBQ ICD IMPLANT N/A 06/27/2023   Procedure: SUBQ ICD IMPLANT;  Surgeon: Cindie Ole DASEN, MD;  Location: [facility] INVASIVE CV LAB;  Service: Cardiovascular;  Laterality: N/A;   HPI:  Molly Howard is a 38 y.o. female who presented to the ED with aphasia.  MRI 9/3 negative for acute findings.  CXR 9/3 suggestive of pulmonary edema.  Pt with past medical history of of chronic combined HFrEF and HFpEF secondary to nonischemic cardiomyopathy, status post AICD 2/25, HTN, IIDM, CKD stage IIIa, polysubstance abuse,last admitted in July 2025,for  acute on chronic systolic chf.    Assessment / Plan / Recommendation  Clinical Impression  Pt presents with a mild oral dysphagia c/b reduced lingual strength and ROM, and decreased labial seal.  These deficits resulted in trace intralabial escape of oral residue/secretions and very trace oral residue on R side of dorsal lingual surface and R buccal cavity.  Pt is acheiving adquate oral clearance despite deficits on OME.  Open mouth posture during mastication intermittently observed without anterior loss of bolus.  There were no clinical s/s of aspiration with any consistencies trialed.  Pt did not take serial straw sips of liquid, but tolerated single straw and cup sips without difficulty.  There was good oral containment of both cup and straw sips with only trace anterior loss from center of labial seal at rim of cup.    Recommend regular texture diet with thin liquids.   SLP Visit Diagnosis: Dysphagia, oral phase (R13.11)    Aspiration Risk  Mild aspiration risk    Diet Recommendation Regular;Thin liquid    Liquid Administration via: Cup;Straw Medication Administration: Whole meds with liquid Supervision: Patient able to self feed (assist prn) Compensations: Slow rate;Small sips/bites;Lingual sweep for clearance of pocketing Postural Changes: Seated upright at 90 degrees    Other  Recommendations Oral Care Recommendations: Oral care BID     Assistance Recommended at Discharge  N/A  Functional  Status Assessment Patient has had a recent decline in their functional status and demonstrates the ability to make significant improvements in function in a reasonable and predictable amount of time.  Frequency and Duration min 2x/week  2 weeks       Prognosis Prognosis for improved oropharyngeal function: Good      Swallow Study   General Date of Onset: 01/04/24 HPI: Molly Howard is a 38 y.o. female who presented to the ED with aphasia.  MRI 9/3 negative for acute findings.  CXR 9/3  suggestive of pulmonary edema.  Pt with past medical history of of chronic combined HFrEF and HFpEF secondary to nonischemic cardiomyopathy, status post AICD 2/25, HTN, IIDM, CKD stage IIIa, polysubstance abuse,last admitted in July 2025,for acute on chronic systolic chf. Type of Study: Bedside Swallow Evaluation Previous Swallow Assessment: None Diet Prior to this Study: NPO Temperature Spikes Noted: No Respiratory Status: Room air History of Recent Intubation: No Behavior/Cognition: Cooperative;Alert;Pleasant mood Oral Cavity Assessment: Within Functional Limits Oral Care Completed by SLP: No Oral Cavity - Dentition: Adequate natural dentition;Dentures, bottom (partial) Vision: Functional for self-feeding Patient Positioning: Upright in bed (edge of bed) Baseline Vocal Quality: Not observed Volitional Cough: Cognitively unable to elicit Volitional Swallow: Unable to elicit    Oral/Motor/Sensory Function Overall Oral Motor/Sensory Function: Mild impairment Facial ROM: Reduced right Facial Symmetry: Abnormal symmetry right Facial Sensation: Within Functional Limits Lingual ROM: Reduced left Lingual Symmetry: Abnormal symmetry right Lingual Strength: Reduced Velum:  (reduced elevation) Mandible: Within Functional Limits   Ice Chips Ice chips: Not tested   Thin Liquid Thin Liquid: Within functional limits Presentation: Cup;Straw    Nectar Thick Nectar Thick Liquid: Not tested   Honey Thick Honey Thick Liquid: Not tested   Puree Puree: Within functional limits Presentation: Spoon   Solid     Solid: Impaired Oral Phase Functional Implications: Oral residue;Right anterior spillage (very very trace)      Molly FORBES Grippe, MA, CCC-SLP Acute Rehabilitation Services Office: [phone] 01/05/2024,9:01 AM

## 2024-01-05 NOTE — CV Procedure (Signed)
  Device system confirmed to be MRI conditional, with implant date > 6 weeks ago, and no evidence of abandoned or epicardial leads in review of most recent CXR  Device last cleared by EP Provider: Daphne Barrack 01/04/24  Clearance is good through for 1 year as long as parameters remain stable at time of check. If pt undergoes a cardiac device procedure during that time, they should be re-cleared.   Tachy-therapies to be programmed off if applicable with device back to pre-MRI settings after completion of exam.  [employer] was available remotely to assist in programming recommendations.   Emogene Bouchard, RT  01/05/2024 10:40 AM

## 2024-01-05 NOTE — TOC CAGE-AID Note (Signed)
 Transition of Care [facility]) - CAGE-AID Screening   Patient Details  Name: Molly Howard MRN: [id number] Date of Birth: 07/03/1985  Transition of Care [facility]) CM/SW Contact:    Carmelita FORBES Carbon, LCSW Phone Number: 01/05/2024, 10:31 AM   Clinical Narrative:    CAGE-AID Screening: Substance Abuse Screening unable to be completed due to: : Patient unable to participate

## 2024-01-05 NOTE — Progress Notes (Signed)
 Heart Failure Navigator Progress Note  Assessed for Heart & Vascular TOC clinic readiness.  Patient does not meet criteria due to current Advanced Heart Failure Team. Patient has an upcoming appointment already scheduled on 01/11/24 @ 9:30 AM at the [facility] A[facility] location. Navigator will add appointment to her AVS for discharge.  Patient is already aware of all Heart Failure Education.  Navigator will sign off at this time.  Charmaine Pines, RN, BSN [facility] Heart Failure Navigator Secure Chat Only

## 2024-01-05 NOTE — TOC Initial Note (Signed)
 Transition of Care [facility]) - Initial/Assessment Note    Patient Details  Name: Molly Howard MRN: [id number] Date of Birth: 11-23-1985  Transition of Care [facility]) CM/SW Contact:    Lendia Dais, LCSWA Phone Number: 01/05/2024, 1:50 PM  Clinical Narrative:  Pt is from home with girlfriend. Pt was nonverbal but was able to responded with head shaking/nodding. Pt is independent with ADL's, has no DME, and doesn't drive. Pt's sister and girlfriend Charmaine provide transportation as needed.   Pt works full-time and is able to afford all basic needs and has no SDOH concerns. Pt gave permission to contact [place] for any updates in treatment plan. Charmaine was able to confirm current address.  No inpatient care management (ICM) needs at this time.        Expected Discharge Plan: Home/Self Care Barriers to Discharge: Continued Medical Work up   Patient Goals and CMS Choice Patient states their goals for this hospitalization and ongoing recovery are:: Return home   Choice offered to / list presented to : NA      Expected Discharge Plan and Services In-house Referral: Clinical Social Work     Living arrangements for the past 2 months: Single Family Home                                      Prior Living Arrangements/Services Living arrangements for the past 2 months: Single Family Home Lives with:: Significant Other Patient language and need for interpreter reviewed:: Yes        Need for Family Participation in Patient Care: No (Comment) Care giver support system in place?: No (comment)   Criminal Activity/Legal Involvement Pertinent to Current Situation/Hospitalization: No - Comment as needed  Activities of Daily Living   ADL Screening (condition at time of admission) Independently performs ADLs?: Yes (appropriate for developmental age) Is the patient deaf or have difficulty hearing?: No Does the patient have difficulty seeing, even when wearing glasses/contacts?:  No Does the patient have difficulty concentrating, remembering, or making decisions?: No  Permission Sought/Granted Permission sought to share information with : Family Supports Permission granted to share information with : Yes, Verbal Permission Granted  Share Information with NAME: [name]     Permission granted to share info w Relationship: Significant other  Permission granted to share info w Contact Information: [phone]  Emotional Assessment Appearance:: Appears stated age Attitude/Demeanor/Rapport: Engaged Affect (typically observed): Flat Orientation: : Oriented to Situation, Oriented to Self, Oriented to Place, Oriented to  Time Alcohol / Substance Use: Not Applicable Psych Involvement: No (comment)  Admission diagnosis:  Aphasia [R47.01] Facial droop [R29.810] Expressive aphasia [R47.01] Patient Active Problem List   Diagnosis Date Noted   Aphasia 01/04/2024   History of medication noncompliance 10/12/2023   CHF (congestive heart failure) (HCC) 10/11/2023   Class 1 obesity 08/31/2023   Hyponatremia 08/31/2023   Acute on chronic HFrEF (heart failure with reduced ejection fraction) (HCC) 08/28/2023   COPD exacerbation (HCC) 08/28/2023   Acute kidney injury superimposed on CKD (HCC) 08/15/2023   Chest pain 08/07/2023   Type 2 diabetes mellitus without complications (HCC) 08/07/2023   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   Primary hypertension 04/16/2023   Controlled type 2 diabetes mellitus without complication, without long-term current use of insulin  (HCC) 04/15/2023   Cardiogenic shock (HCC) 11/14/2022   Bilateral leg edema 11/06/2022   NICM (nonischemic cardiomyopathy) (HCC) 07/31/2022  Hypocalcemia 07/25/2022   Leg edema 07/25/2022   Acute on chronic combined systolic and diastolic CHF (congestive heart failure) (HCC) 07/25/2022   GERD without esophagitis 07/24/2022   Cocaine abuse (HCC) 02/12/2022   Acute pulmonary  edema (HCC)    Acute systolic heart failure (HCC) 02/09/2022   Syncope 02/09/2022   Menorrhagia with regular cycle 06/22/2021   Dyslipidemia 11/27/2020   Type II diabetes mellitus with renal manifestations (HCC) 11/27/2020   Chronic kidney disease (CKD), stage II (mild) 11/27/2020   Hypomagnesemia 10/17/2020   Uncontrolled type 2 diabetes mellitus with hyperglycemia, without long-term current use of insulin  (HCC) 10/11/2020   Dyspnea 05/21/2020   Essential hypertension 05/20/2020   Diabetes mellitus, type II (HCC) 05/20/2020   Chronic anemia 05/20/2020   Reactive thrombocytosis 05/20/2020   Obesity (BMI 30-39.9) 05/20/2020   Chronic combined systolic and diastolic heart failure (HCC) 11/01/2019   Financial difficulties 11/01/2019   Medication management 11/01/2019   Lung nodule 11/01/2019   Nonischemic cardiomyopathy (HCC) 11/01/2019   Polysubstance abuse (HCC) 11/01/2019   Tobacco use 11/01/2019   HFrEF (heart failure with reduced ejection fraction) (HCC)    Swelling    Anemia 10/02/2019   PCP:  [name], DO Pharmacy:   [facility] [address] [place] [address] Phone: [phone] Fax: [phone]     Social Drivers of Health (SDOH) Social History: SDOH Screenings   Food Insecurity: Unknown (01/04/2024)  Recent Concern: Food Insecurity - Food Insecurity Present (10/11/2023)  Housing: High Risk (01/04/2024)  Transportation Needs: Unmet Transportation Needs (01/04/2024)  Utilities: Not At Risk (01/04/2024)  Alcohol Screen: Low Risk  (04/15/2023)  Depression (PHQ2-9): Low Risk  (03/02/2023)  Financial Resource Strain: High Risk (08/25/2023)  Social Connections: Unknown (08/28/2023)  Tobacco Use: Medium Risk (01/03/2024)   SDOH Interventions:     Readmission Risk Interventions    11/08/2022    3:23 PM 07/25/2022   10:35 AM 02/15/2022    2:34 PM  Readmission Risk Prevention Plan  Transportation Screening Complete  Complete Complete  PCP or Specialist Appt within 3-5 Days  Complete Complete  Social Work Consult for Recovery Care Planning/Counseling  Complete Complete  Palliative Care Screening  Not Applicable Not Applicable  Medication Review Ocean[facility]) Complete Complete Complete  PCP or Specialist appointment within 3-5 days of discharge Complete    SW Recovery Care/Counseling Consult Complete    Palliative Care Screening Not Applicable    Skilled Nursing Facility Not Applicable

## 2024-01-05 NOTE — Evaluation (Signed)
 Occupational Therapy Evaluation Patient Details Name: Molly Howard MRN: [id number] DOB: 1985-07-11 Today's Date: 01/05/2024   History of Present Illness   Pt is a 38 year old woman admitted on 01/04/24 with aphasia and R side weakness. Brain imaging negative for acute findings. CTX suggestive of pulmonary edema. 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     Clinical Impressions Pt was independent prior to admission. She lives with her girlfriend in a homeless shelter. Pt presents with R UE weakness and incoordination. Pt is unable to speak, but indicates a difference in sensation in R UE vs L UE. Pt attempts to use R UE spontaneously during ADLs. She requires min assist for ADLs and supervision for ambulation to bathroom for toileting and hand washing at sink. Cognition is difficult to assess due to impaired communication. Pt requires multimodal cues to follow directions when educated in novel exercise program for R UE with theraputty. Issued foam build ups for eating utensils and encouraged pt use R UE to self feed when safe. Recommending OPOT upon discharge.      If plan is discharge home, recommend the following:   A little help with walking and/or transfers;A little help with bathing/dressing/bathroom;Assistance with cooking/housework;Direct supervision/assist for medications management;Direct supervision/assist for financial management;Assist for transportation     Functional Status Assessment   Patient has had a recent decline in their functional status and demonstrates the ability to make significant improvements in function in a reasonable and predictable amount of time.     Equipment Recommendations   None recommended by OT     Recommendations for Other Services         Precautions/Restrictions   Precautions Precautions: Fall Recall of Precautions/Restrictions:  Intact Restrictions Weight Bearing Restrictions Per Provider Order: No     Mobility Bed Mobility               General bed mobility comments: received and returned to EOB    Transfers Overall transfer level: Needs assistance Equipment used: None Transfers: Sit to/from Stand Sit to Stand: Supervision           General transfer comment: supervision for safety and IV line      Balance Overall balance assessment: Needs assistance   Sitting balance-Leahy Scale: Good       Standing balance-Leahy Scale: Fair                             ADL either performed or assessed with clinical judgement   ADL Overall ADL's : Needs assistance/impaired Eating/Feeding: Minimal assistance;Sitting Eating/Feeding Details (indicate cue type and reason): finger feeds with R hand, uses utensils with L Grooming: Standing;Minimal assistance;Wash/dry hands   Upper Body Bathing: Minimal assistance;Sitting   Lower Body Bathing: Minimal assistance;Sit to/from stand   Upper Body Dressing : Minimal assistance;Standing   Lower Body Dressing: Minimal assistance;Sit to/from stand Lower Body Dressing Details (indicate cue type and reason): assist to start over toes, can pull up Toilet Transfer: Supervision/safety;Ambulation   Toileting- Clothing Manipulation and Hygiene: Supervision/safety;Sit to/from stand Toileting - Clothing Manipulation Details (indicate cue type and reason): L hand lead for pericare     Functional mobility during ADLs: Supervision/safety       Vision Baseline Vision/History: 0 No visual deficits Ability to See in Adequate Light: 0 Adequate Patient Visual Report: No change from baseline  Perception Perception: Not tested       Praxis Praxis:  (needs further assessment)       Pertinent Vitals/Pain Pain Assessment Pain Assessment: Faces Faces Pain Scale: No hurt     Extremity/Trunk Assessment Upper Extremity Assessment Upper Extremity  Assessment: Right hand dominant;RUE deficits/detail RUE Deficits / Details: 4-/5 strength in shoulder, 4/5 elbow, 4-/5 hand RUE Sensation:  (indicates difference in sensation in R UE vs L) RUE Coordination: decreased fine motor;decreased gross motor   Lower Extremity Assessment Lower Extremity Assessment: Defer to PT evaluation   Cervical / Trunk Assessment Cervical / Trunk Assessment: Normal   Communication Communication Communication: Impaired Factors Affecting Communication: Difficulty expressing self;Other (comment) (questionable receptive deficits)   Cognition Arousal: Alert Behavior During Therapy: Flat affect Cognition: Difficult to assess Difficult to assess due to: Impaired communication           OT - Cognition Comments: communicating with gestures, making needs known, some difficulty following commands with theraputty exercises, benefits from repetition with demonstration                 Following commands: Impaired Following commands impaired: Follows one step commands with increased time     Cueing  General Comments   Cueing Techniques: Verbal cues;Gestural cues      Exercises Exercises: Other exercises Other Exercises Other Exercises: yellow theraputty, blue foam block exercises with R UE Other Exercises: issued foam build ups for utensils and encouraged pt to self feed with R hand   Shoulder Instructions      Home Living Family/patient expects to be discharged to:: Shelter/Homeless     Type of Home: Homeless                              Lives With: Significant other    Prior Functioning/Environment Prior Level of Function : Independent/Modified Independent                    OT Problem List: Decreased strength;Decreased coordination;Decreased cognition;Impaired UE functional use   OT Treatment/Interventions: Self-care/ADL training;Neuromuscular education;Therapeutic activities;Cognitive  remediation/compensation;Patient/family education      OT Goals(Current goals can be found in the care plan section)   Acute Rehab OT Goals OT Goal Formulation: With patient/family Time For Goal Achievement: 01/19/24 Potential to Achieve Goals: Good ADL Goals Pt Will Perform Eating: with modified independence;sitting Pt Will Perform Grooming: standing;with supervision Pt Will Perform Upper Body Dressing: with set-up;sitting Pt Will Perform Lower Body Dressing: with set-up;sit to/from stand Pt Will Transfer to Toilet: with supervision;ambulating;regular height toilet Pt Will Perform Toileting - Clothing Manipulation and hygiene: with supervision;sit to/from stand Pt/caregiver will Perform Home Exercise Program: Increased strength;Right Upper extremity;With theraputty;With Supervision;With written HEP provided   OT Frequency:  Min 2X/week    Co-evaluation              AM-PAC OT 6 Clicks Daily Activity     Outcome Measure Help from another person eating meals?: A Little Help from another person taking care of personal grooming?: A Little Help from another person toileting, which includes using toliet, bedpan, or urinal?: A Little Help from another person bathing (including washing, rinsing, drying)?: A Little Help from another person to put on and taking off regular upper body clothing?: A Little Help from another person to put on and taking off regular lower body clothing?: A Little 6 Click Score: 18   End of Session Equipment Utilized During Treatment:  Gait belt  Activity Tolerance: Patient tolerated treatment well Patient left: in bed;with call bell/phone within reach;with family/visitor present  OT Visit Diagnosis: Cognitive communication deficit (R41.841);Muscle weakness (generalized) (M62.81)                Time: 8892-8864 OT Time Calculation (min): 28 min Charges:  OT General Charges $OT Visit: 1 Visit OT Evaluation $OT Eval Moderate Complexity: 1 Mod OT  Treatments $Neuromuscular Re-education: 8-22 mins  Molly Howard, OTR/L Acute Rehabilitation Services Office: [phone]   Molly Howard 01/05/2024, 12:00 PM

## 2024-01-06 DIAGNOSIS — R531 Weakness: Secondary | ICD-10-CM | POA: Diagnosis not present

## 2024-01-06 DIAGNOSIS — R29810 Facial weakness: Secondary | ICD-10-CM | POA: Insufficient documentation

## 2024-01-06 DIAGNOSIS — R4701 Aphasia: Secondary | ICD-10-CM | POA: Diagnosis not present

## 2024-01-06 LAB — GLUCOSE, CAPILLARY: Glucose-Capillary: 128 mg/dL — ABNORMAL HIGH (ref 70–99)

## 2024-01-06 MED ORDER — SACUBITRIL-VALSARTAN 97-103 MG PO TABS
1.0000 | ORAL_TABLET | Freq: Two times a day (BID) | ORAL | Status: DC
  Administered 2024-01-06: 1 via ORAL
  Filled 2024-01-06: qty 1

## 2024-01-06 NOTE — TOC Transition Note (Signed)
 Transition of Care [facility]) - Discharge Note   Patient Details  Name: Molly Howard MRN: [id number] Date of Birth: 09-04-1985  [facility]) CM/SW Contact:  Tom-Johnson, Harvest Muskrat, RN Phone Number: 01/06/2024, 10:16 AM   Clinical Narrative:     Patient is scheduled for discharge today.  Readmission Risk Assessment done. Outpatient referral, hospital f/u and discharge instructions on AVS. Sister, Arlander to transport at discharge.  No further ICM needs noted.       Final next level of care: OP Rehab Barriers to Discharge: Barriers Resolved   Patient Goals and CMS Choice Patient states their goals for this hospitalization and ongoing recovery are:: To return home CMS Medicare.gov Compare Post Acute Care list provided to:: Patient Choice offered to / list presented to : Patient      Discharge Placement                Patient to be transferred to facility by: Sister Name of family member notified: Shy    Discharge Plan and Services Additional resources added to the After Visit Summary for   In-house Referral: Clinical Social Work              DME Arranged: N/A DME Agency: NA       HH Arranged: NA HH Agency: NA        Social Drivers of Health (SDOH) Interventions SDOH Screenings   Food Insecurity: Unknown (01/04/2024)  Recent Concern: Food Insecurity - Food Insecurity Present (10/11/2023)  Housing: High Risk (01/04/2024)  Transportation Needs: Unmet Transportation Needs (01/04/2024)  Utilities: Not At Risk (01/04/2024)  Alcohol Screen: Low Risk  (04/15/2023)  Depression (PHQ2-9): Low Risk  (03/02/2023)  Financial Resource Strain: High Risk (08/25/2023)  Social Connections: Unknown (08/28/2023)  Tobacco Use: Medium Risk (01/03/2024)     Readmission Risk Interventions    01/06/2024   10:15 AM 11/08/2022    3:23 PM 07/25/2022   10:35 AM  Readmission Risk Prevention Plan  Transportation Screening Complete Complete Complete  PCP or Specialist Appt  within 3-5 Days   Complete  Social Work Consult for Recovery Care Planning/Counseling   Complete  Palliative Care Screening   Not Applicable  Medication Review Oceanographer) Referral to Pharmacy Complete Complete  PCP or Specialist appointment within 3-5 days of discharge Complete Complete   HRI or Home Care Consult Complete    SW Recovery Care/Counseling Consult Complete Complete   Palliative Care Screening Not Applicable Not Applicable   Skilled Nursing Facility Not Applicable Not Applicable
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
## 2024-01-06 NOTE — Progress Notes (Signed)
 Occupational Therapy Treatment Patient Details Name: Molly Howard MRN: [id number] DOB: [birth year] Today's Date: 01/06/2024   History of present illness Pt is a 38 year old woman admitted on 01/04/24 with aphasia and R side weakness. Brain imaging negative for acute findings. CTX suggestive of pulmonary edema. 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   OT comments  Pt dressed herself for home. Improved UE coordination and strength. Demonstrated ability to write words sister and girl to aid in communication. Continue to recommend OPOT.      If plan is discharge home, recommend the following:  A little help with walking and/or transfers;A little help with bathing/dressing/bathroom;Assistance with cooking/housework;Direct supervision/assist for medications management;Direct supervision/assist for financial management;Assist for transportation   Equipment Recommendations  None recommended by OT    Recommendations for Other Services      Precautions / Restrictions Precautions Precautions: None Recall of Precautions/Restrictions: Intact Restrictions Weight Bearing Restrictions Per Provider Order: No       Mobility Bed Mobility               General bed mobility comments: in chair    Transfers Overall transfer level: Independent Equipment used: None               General transfer comment: picking items up from floor without LOB     Balance Overall balance assessment: Needs assistance   Sitting balance-Leahy Scale: Good       Standing balance-Leahy Scale: Good                             ADL either performed or assessed with clinical judgement   ADL                   Upper Body Dressing : Independent   Lower Body Dressing: Independent   Toilet Transfer: Independent;Ambulation                  Extremity/Trunk Assessment  Upper Extremity Assessment RUE Deficits / Details: using R UE to self feed, write one word legibly            Vision       Perception     Praxis     Communication Communication Communication: Impaired Factors Affecting Communication: Difficulty expressing self;Other (comment) (questionable receptive)   Cognition Arousal: Alert Behavior During Therapy: Flat affect Cognition: Difficult to assess Difficult to assess due to: Impaired communication           OT - Cognition Comments: able to write one word to communicate                 Following commands: Impaired Following commands impaired: Only follows one step commands consistently      Cueing   Cueing Techniques: Verbal cues  Exercises      Shoulder Instructions       General Comments      Pertinent Vitals/ Pain       Pain Assessment Pain Assessment: No/denies pain  Home Living                                          Prior Functioning/Environment              Frequency  Min 2X/week  Progress Toward Goals  OT Goals(current goals can now be found in the care plan section)  Progress towards OT goals: Progressing toward goals  Acute Rehab OT Goals OT Goal Formulation: With patient/family Time For Goal Achievement: 01/19/24 Potential to Achieve Goals: Good  Plan      Co-evaluation                 AM-PAC OT 6 Clicks Daily Activity     Outcome Measure   Help from another person eating meals?: A Little Help from another person taking care of personal grooming?: None Help from another person toileting, which includes using toliet, bedpan, or urinal?: None Help from another person bathing (including washing, rinsing, drying)?: A Little Help from another person to put on and taking off regular upper body clothing?: None Help from another person to put on and taking off regular lower body clothing?: None 6 Click Score: 22    End of Session    OT  Visit Diagnosis: Cognitive communication deficit (R41.841);Muscle weakness (generalized) (M62.81)   Activity Tolerance Patient tolerated treatment well   Patient Left in chair;with call bell/phone within reach;with nursing/sitter in room   Nurse Communication          Time: 9071-9056 OT Time Calculation (min): 15 min  Charges: OT General Charges $OT Visit: 1 Visit OT Treatments $Self Care/Home Management : 8-22 mins  Mliss HERO, OTR/L Acute Rehabilitation Services Office: [phone]   Kennth Mliss Helling 01/06/2024, 11:19 AM

## 2024-01-09 ENCOUNTER — Ambulatory Visit (INDEPENDENT_AMBULATORY_CARE_PROVIDER_SITE_OTHER): Payer: MEDICAID

## 2024-01-09 DIAGNOSIS — I5020 Unspecified systolic (congestive) heart failure: Secondary | ICD-10-CM

## 2024-01-10 ENCOUNTER — Telehealth: Payer: Self-pay | Admitting: Family

## 2024-01-10 LAB — CUP PACEART REMOTE DEVICE CHECK
Battery Remaining Percentage: 92 %
Date Time Interrogation Session: 20250907201300
HighPow Impedance: 60 Ohm
Implantable Lead Connection Status: 753985
Implantable Lead Implant Date: 20250224
Implantable Lead Location: 753860
Implantable Lead Model: 3501
Implantable Lead Serial Number: 264353
Implantable Pulse Generator Implant Date: 20250224
Pulse Gen Serial Number: 321419

## 2024-01-10 NOTE — Telephone Encounter (Signed)
 Called to confirm/remind patient of their appointment at the [facility] on 01/11/24.   Appointment:   [] Confirmed  [] Left mess   [] No answer/No voice mail  [x] VM Full/unable to leave message  [] Phone not in service  Patient reminded to bring all medications and/or complete list.  Confirmed patient has transportation. Gave directions, instructed to utilize valet parking.

## 2024-01-11 ENCOUNTER — Ambulatory Visit: Payer: MEDICAID | Admitting: Family

## 2024-01-11 ENCOUNTER — Other Ambulatory Visit
Admission: RE | Admit: 2024-01-11 | Discharge: 2024-01-11 | Disposition: A | Discharge status: Home / Self Care | Payer: MEDICAID | Source: Ambulatory Visit | Attending: Family | Admitting: Family

## 2024-01-11 ENCOUNTER — Other Ambulatory Visit: Payer: Self-pay

## 2024-01-11 ENCOUNTER — Encounter: Payer: Self-pay | Admitting: Family

## 2024-01-11 ENCOUNTER — Ambulatory Visit: Payer: Self-pay | Admitting: Family

## 2024-01-11 VITALS — BP 161/104 | HR 81 | Wt 255.0 lb

## 2024-01-11 DIAGNOSIS — E1122 Type 2 diabetes mellitus with diabetic chronic kidney disease: Secondary | ICD-10-CM | POA: Diagnosis not present

## 2024-01-11 DIAGNOSIS — N1831 Chronic kidney disease, stage 3a: Secondary | ICD-10-CM

## 2024-01-11 DIAGNOSIS — D509 Iron deficiency anemia, unspecified: Secondary | ICD-10-CM | POA: Diagnosis not present

## 2024-01-11 DIAGNOSIS — I5020 Unspecified systolic (congestive) heart failure: Secondary | ICD-10-CM

## 2024-01-11 DIAGNOSIS — Z5900 Homelessness unspecified: Secondary | ICD-10-CM

## 2024-01-11 DIAGNOSIS — F1910 Other psychoactive substance abuse, uncomplicated: Secondary | ICD-10-CM

## 2024-01-11 DIAGNOSIS — R4701 Aphasia: Secondary | ICD-10-CM

## 2024-01-11 DIAGNOSIS — I10 Essential (primary) hypertension: Secondary | ICD-10-CM | POA: Diagnosis not present

## 2024-01-11 LAB — BASIC METABOLIC PANEL WITH GFR
Anion gap: 10 (ref 5–15)
BUN: 30 mg/dL — ABNORMAL HIGH (ref 6–20)
CO2: 29 mmol/L (ref 22–32)
Calcium: 7.4 mg/dL — ABNORMAL LOW (ref 8.9–10.3)
Chloride: 103 mmol/L (ref 98–111)
Creatinine, Ser: 1.36 mg/dL — ABNORMAL HIGH (ref 0.44–1.00)
GFR, Estimated: 51 mL/min — ABNORMAL LOW (ref >60)
Glucose, Bld: 130 mg/dL — ABNORMAL HIGH (ref 70–99)
Potassium: 4.1 mmol/L (ref 3.5–5.1)
Sodium: 142 mmol/L (ref 135–145)

## 2024-01-11 LAB — DIGOXIN LEVEL: Digoxin Level: 0.9 ng/mL (ref 0.8–2.0)

## 2024-01-11 MED ORDER — TORSEMIDE 20 MG PO TABS
40.0000 mg | ORAL_TABLET | Freq: Every day | ORAL | 5 refills | Status: DC
  Filled 2024-01-11: qty 60, 30d supply, fill #0

## 2024-01-11 NOTE — Patient Instructions (Signed)
 Medication Changes:  INCREASE Torsemide  40mg  ( 2 tabs) daily  Lab Work:  Go over to the [facility]. Go pass the gift shop and have your blood work completed.  We will only call you if the results are abnormal or if the provider would like to make medication changes.  No news is good news.    Referrals:  You have been referred to [facility]. This office will be in contact with you in order to schedule your appointment.   Special Instructions // Education:  Your pcp is Sharyle Fischer, DO. Her number is [phone]. Please get in contact with her in order to schedule a follow up.  Follow-Up in: Please follow up with the [facility] in 2 weeks with [name], FNP.   Thank you for choosing [facility].    At the [facility], you and your health needs are our priority. We have a designated team specialized in the treatment of Heart Failure. This Care Team includes your primary Heart Failure Specialized Cardiologist (physician), Advanced Practice Providers (APPs- Physician Assistants and Nurse Practitioners), and Pharmacist who all work together to provide you with the care you need, when you need it.   You may see any of the following providers on your designated Care Team at your next follow up:  Dr. Toribio Fuel Dr. Ezra Shuck Dr. Ria Commander Dr. Morene Brownie [name], FNP Jaun Bash, RPH-CPP  Please be sure to bring in all your medications bottles to every appointment.   Need to Contact Us :  If you have any questions or concerns before your next appointment please send us  a message through [place] or call our office at [phone].    TO LEAVE A MESSAGE FOR THE NURSE SELECT OPTION 2, PLEASE LEAVE A MESSAGE INCLUDING: YOUR NAME DATE OF BIRTH CALL BACK NUMBER REASON FOR CALL**this is important as we prioritize the call backs  YOU WILL RECEIVE A CALL BACK THE SAME  DAY AS LONG AS YOU CALL BEFORE 4:00 PM
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
## 2024-01-12 LAB — MISC LABCORP TEST (SEND OUT): Labcorp test code: 143000

## 2024-01-13 ENCOUNTER — Ambulatory Visit: Payer: Self-pay

## 2024-01-13 NOTE — Telephone Encounter (Signed)
 FYI Only or Action Required?: FYI only for provider.  Patient was last seen in primary care on 03/02/2023 by Leavy Mole, PA-C.  Called Nurse Triage reporting unable to talk.  Symptoms began a week ago.  Interventions attempted: Other: transport to ED and hospitalizaion.  Symptoms are: unchanged.  Triage Disposition: See PCP When Office is Open (Within 3 Days)  Patient/caregiver understands and will follow disposition?: Yes   Copied from CRM #[id number]. Topic: Clinical - Red Word Triage >> Jan 13, 2024  3:23 PM Dedra B wrote: [facility] that prompted transfer to Nurse Triage: Pt supervisor, Vivian, is calling on behalf of pt. Pt had stroke related episode last week and went to the hospital. CT scan and MRI showed no signs of stroke and was discharged. Pt has nonverbal since the stroke episode and had another episode this past Tuesday. Warm transfer to nurse triage. Reason for Disposition  [1] Loss of speech or garbled speech AND [2] is a chronic symptom (recurrent or ongoing AND present > 4 weeks)  Answer Assessment - Initial Assessment Questions Patient's boss called to schedule hospital follow up on behalf of patient.  No worsening symptoms since hospitalization on 01/03/2024, but no improvement  1. SYMPTOM: What is the main symptom you are concerned about? (e.g., weakness, numbness)     Expressive aphasia 2. ONSET: When did this start? (e.g., minutes, hours, days; while sleeping)     01/03/2024  7. OTHER SYMPTOMS: Do you have any other symptoms?     Lethargic and laying around  Protocols used: Neurologic Deficit-A-AH

## 2024-01-15 ENCOUNTER — Ambulatory Visit: Payer: Self-pay | Admitting: Cardiology

## 2024-01-16 ENCOUNTER — Encounter: Payer: Self-pay | Admitting: Internal Medicine

## 2024-01-16 ENCOUNTER — Ambulatory Visit (INDEPENDENT_AMBULATORY_CARE_PROVIDER_SITE_OTHER): Payer: MEDICAID | Admitting: Internal Medicine

## 2024-01-16 ENCOUNTER — Other Ambulatory Visit: Payer: Self-pay

## 2024-01-16 ENCOUNTER — Inpatient Hospital Stay: Payer: MEDICAID | Admitting: Internal Medicine

## 2024-01-16 VITALS — BP 120/72 | HR 86 | Temp 98.1°F | Resp 16 | Ht 67.0 in | Wt 252.0 lb

## 2024-01-16 DIAGNOSIS — R4701 Aphasia: Secondary | ICD-10-CM

## 2024-01-16 DIAGNOSIS — N1831 Chronic kidney disease, stage 3a: Secondary | ICD-10-CM

## 2024-01-16 DIAGNOSIS — E1122 Type 2 diabetes mellitus with diabetic chronic kidney disease: Secondary | ICD-10-CM | POA: Diagnosis not present

## 2024-01-16 DIAGNOSIS — Z7984 Long term (current) use of oral hypoglycemic drugs: Secondary | ICD-10-CM

## 2024-01-16 DIAGNOSIS — I5020 Unspecified systolic (congestive) heart failure: Secondary | ICD-10-CM | POA: Diagnosis not present

## 2024-01-16 DIAGNOSIS — Z09 Encounter for follow-up examination after completed treatment for conditions other than malignant neoplasm: Secondary | ICD-10-CM

## 2024-01-16 DIAGNOSIS — E785 Hyperlipidemia, unspecified: Secondary | ICD-10-CM

## 2024-01-16 MED ORDER — ATORVASTATIN CALCIUM 20 MG PO TABS
20.0000 mg | ORAL_TABLET | Freq: Every day | ORAL | 3 refills | Status: DC
  Filled 2024-01-16 – 2024-01-26 (×2): qty 90, 90d supply, fill #0

## 2024-01-16 MED ORDER — DAPAGLIFLOZIN PROPANEDIOL 10 MG PO TABS
10.0000 mg | ORAL_TABLET | Freq: Every day | ORAL | 1 refills | Status: DC
  Filled 2024-01-16: qty 90, 90d supply, fill #0

## 2024-01-16 NOTE — Progress Notes (Signed)
 Established Patient Office Visit  Subjective:  Patient ID: Molly Howard, female    DOB: 1985/07/22  Age: 38 y.o. MRN: [id number]  CC:  Chief Complaint  Patient presents with   Hospitalization Follow-up    HPI Molly Howard presents for hospital follow up. She has not been seen here in 11 months. She is here with her girlfriend who provides the history due to the aphasia.   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  Consultants: Neurology - thinking this is a functional neurologic deficit with recommendations for outpatient Psychiatry  New medications: None Discharge instructions:  recheck CBC, BMP Status: stable  Discussed the use of AI scribe software for clinical note transcription with the patient, who gave verbal consent to proceed.  History of Present Illness Molly Howard is a 38 year old female with a history of stroke who presents with speech difficulties following a recent episode of right-sided weakness and aphasia. She is accompanied by her girlfriend, Molly Howard.  On September 3rd, Molly Howard experienced sudden right-sided weakness, facial drooping, and aphasia at a shelter. She had numbness in her right hand and difficulty speaking. Paramedics recorded a blood sugar level of 298 mg/dL. At [facility], a CT scan showed no acute bleeding, and an MRI revealed remote infarcts in the cerebellum and left frontal lobe, with no acute stroke.  Since the episode, she has significant speech difficulties, managing simple phrases but struggling with complex speech, requiring effort to initiate and articulate words.  Her medications include torsemide , carvedilol , Entresto , Farxiga , digoxin , spironolactone , and Ozempic , which she recently restarted. Atorvastatin  was discontinued prior to the recent  episode. She experienced adverse effects from a potassium supplement. She has congestive heart failure and diabetes, with a recent A1c of 6.7%.   Hypertension/HFrEF/NICM: -Medications: Torsemide  40 mg BID, Carvedilol  12.5 mg BID, Entresto , Farxiga  10 mg, Digoxin  0.125 mg, Spironolactone  25 mg -Has been compliant with her medications for the last month and doing well overall -Follows with Cardiology, last seen 01/09/23 -Last echo 07/25/22 showing reduced EF at 25-30% with global hypokinesis of the left ventricle   Diabetes Type 2: -Last A1c 6.7% -Medications: Fargixa 10 mg, Ozempic  0.25 mg, no longer on insulin   -Patient states she is general compliant with the above medications and reports no side effects.  -Checking BG at home: not checking -Eye exam: Due -Foot exam: Due -Microalbumin: Due -Statin: No - had been on a statin uncertain why this was discontinued  -PNA vaccine: UTD -Denies symptoms of hypoglycemia, polyuria, polydipsia, numbness extremities, foot ulcers/trauma.   HLD: -Medications: Had been on Lipitor 20 mg but not now? -Patient is compliant with above medications and reports no side effects.  -Last lipid panel: Lipid Panel     Component Value Date/Time   CHOL 241 (H) 01/05/2024 0347   TRIG 115 01/05/2024 0347   HDL 52 01/05/2024 0347   CHOLHDL 4.6 01/05/2024 0347   VLDL 23 01/05/2024 0347   LDLCALC 166 (H) 01/05/2024 0347   LDLCALC 171 (H) 06/12/2021 1354    Past Medical History:  Diagnosis Date   Acid reflux    Chronic HFrEF (heart failure with reduced ejection fraction) (HCC)    a. 10/2019 Echo: EF 35-40%, GrII DD; b. 05/2020 Echo: EF 20-25%, glob HK; c. 10/2020 Echo: EF 30-35%, glob HK. GrII DD, Mildly red RV fxn. Mod  TR; d.  05/2021 cMRI: EF 42%, no LGE. Nl RV size/fxn.   CKD (chronic kidney disease), stage II    Diabetes mellitus (HCC)    H/O medication noncompliance    Hypertension    Microcytic anemia    NICM (nonischemic cardiomyopathy) (HCC)    a.  10/2019 Echo: EF 35-40%; b. 10/2019 MV: No ischemia. Small apical defect-->breast attenuation; c. 05/2020 Echo: EF 20-25%; d. 10/2020 Echo: EF 30-35%, glob HK. GrII DD, Mildly red RV fxn. Mod TR; e. 05/2021 cMRI: EF 42%, no LGE. Nl RV size/fxn.   Obesity    Polysubstance abuse [facility])     Past Surgical History:  Procedure Laterality Date   CHOLECYSTECTOMY     HERNIA REPAIR     ICD IMPLANT     RIGHT HEART CATH N/A 07/28/2022   Procedure: RIGHT HEART CATH;  Surgeon: Darron Deatrice LABOR, MD;  Location: [facility] INVASIVE CV LAB;  Service: Cardiovascular;  Laterality: N/A;   SUBQ ICD IMPLANT N/A 06/27/2023   Procedure: SUBQ ICD IMPLANT;  Surgeon: Cindie Ole DASEN, MD;  Location: [facility] INVASIVE CV LAB;  Service: Cardiovascular;  Laterality: N/A;    Family History  Problem Relation Age of Onset   Heart failure Mother        Onset of heart failure 40s.  Died in 2022/04/29.   Diabetes Mother    Hypertension Father    Diabetes Father     Social History   Socioeconomic History   Marital status: Single    Spouse name: Not on file   Number of children: 0   Years of education: Not on file   Highest education level: 12th grade  Occupational History   Occupation: Diaabled  Tobacco Use   Smoking status: Former    Current packs/day: 0.00    Types: Cigarettes    Quit date: 2022    Years since quitting: 3.7   Smokeless tobacco: Never  Vaping Use   Vaping status: Never Used  Substance and Sexual Activity   Alcohol use: Not Currently   Drug use: Not Currently    Types: Cocaine    Comment: Admits to using cocaine up and will July 2024.   Sexual activity: Not Currently    Birth control/protection: None  Other Topics Concern   Not on file  Social History Narrative   Lives locally.  Does not routinely exercise.  Has been using cocaine.   Social Drivers of Health   Financial Resource Strain: High Risk (08/25/2023)   Overall Financial Resource Strain (CARDIA)    Difficulty of Paying Living  Expenses: Very hard  Food Insecurity: Unknown (01/04/2024)   Hunger Vital Sign    Worried About Running Out of Food in the Last Year: Patient declined    Ran Out of Food in the Last Year: Not on file  Recent Concern: Food Insecurity - Food Insecurity Present (10/11/2023)   Hunger Vital Sign    Worried About Running Out of Food in the Last Year: Sometimes true    Ran Out of Food in the Last Year: Sometimes true  Transportation Needs: Unmet Transportation Needs (01/04/2024)   PRAPARE - Administrator, Civil Service (Medical): Yes    Lack of Transportation (Non-Medical): Yes  Physical Activity: Not on file  Stress: Not on file  Social Connections: Unknown (08/28/2023)   Social Connection and Isolation Panel    Frequency of Communication with Friends and Family: Not on file    Frequency of Social Gatherings with Friends  and Family: Not on file    Attends Religious Services: Not on file    Active Member of Clubs or Organizations: Not on file    Attends Club or Organization Meetings: Not on file    Marital Status: Never married  Intimate Partner Violence: Patient Declined (01/04/2024)   Humiliation, Afraid, Rape, and Kick questionnaire    Fear of Current or Ex-Partner: Patient declined    Emotionally Abused: Patient declined    Physically Abused: Patient declined    Sexually Abused: Patient declined    Outpatient Medications Prior to Visit  Medication Sig Dispense Refill   carvedilol  (COREG ) 12.5 MG tablet Take 1 tablet (12.5 mg total) by mouth 2 (two) times daily with a meal. 90 tablet 1   dapagliflozin  propanediol (FARXIGA ) 10 MG TABS tablet Take 1 tablet (10 mg total) by mouth once daily. 90 tablet 1   digoxin  (LANOXIN ) 0.125 MG tablet Take 0.5 tablets (0.0625 mg total) by mouth daily. 45 tablet 1   ferrous sulfate  325 (65 FE) MG tablet Take 1 tablet (325 mg total) by mouth 2 (two) times daily. 60 tablet 3   sacubitril -valsartan  (ENTRESTO ) 97-103 MG Take 1 tablet by mouth 2 (two)  times daily. 90 tablet 1   Semaglutide ,0.25 or 0.5MG /DOS, 2 MG/3ML SOPN Inject 0.25 mg into the skin once a week. 3 mL 1   spironolactone  (ALDACTONE ) 25 MG tablet Take 1 tablet (25 mg total) by mouth daily. 90 tablet 1   torsemide  (DEMADEX ) 20 MG tablet Take 2 tablets (40 mg total) by mouth daily. 60 tablet 5   Accu-Chek Softclix Lancets lancets Use as instructed 100 each 12   Continuous Glucose Sensor (FREESTYLE LIBRE 3 SENSOR) MISC Place 1 sensor on the skin every 14 days. Use to check glucose continuously 2 each 12   No facility-administered medications prior to visit.    Allergies  Allergen Reactions   Other Rash    Lemons     ROS Review of Systems  Neurological:  Positive for speech difficulty. Negative for facial asymmetry, weakness and numbness.      Objective:    Physical Exam Constitutional:      Appearance: Normal appearance.  HENT:     Head: Normocephalic and atraumatic.  Eyes:     Conjunctiva/sclera: Conjunctivae normal.  Cardiovascular:     Rate and Rhythm: Normal rate and regular rhythm.  Pulmonary:     Effort: Pulmonary effort is normal.     Breath sounds: Normal breath sounds.  Skin:    General: Skin is warm and dry.  Neurological:     General: No focal deficit present.     Mental Status: She is alert. Mental status is at baseline.     Cranial Nerves: No cranial nerve deficit.     Sensory: No sensory deficit.     Motor: No weakness.     Coordination: Coordination normal.     Gait: Gait normal.     Deep Tendon Reflexes: Reflexes normal.     Comments: No neuro deficits other than impaired speech  Psychiatric:        Mood and Affect: Mood normal.        Behavior: Behavior normal.     BP 120/72 (Cuff Size: Large)   Pulse 86   Temp 98.1 F (36.7 C) (Oral)   Resp 16   Ht 5' 7 (1.702 m)   Wt 252 lb (114.3 kg)   SpO2 99%   BMI 39.47 kg/m  Wt Readings from Last  3 Encounters:  01/16/24 252 lb (114.3 kg)  01/11/24 255 lb (115.7 kg)  01/04/24  267 lb 3.2 oz (121.2 kg)     Health Maintenance Due  Topic Date Due   COVID-19 Vaccine (1) Never done   HPV VACCINES (1 - 3-dose SCDM series) Never done   Diabetic kidney evaluation - Urine ACR  10/04/2023   FOOT EXAM  11/16/2023   OPHTHALMOLOGY EXAM  11/26/2023   Influenza Vaccine  12/02/2023        Topic Date Due   HPV VACCINES (1 - 3-dose SCDM series) Never done    Lab Results  Component Value Date   TSH 5.114 (H) 11/09/2021   Lab Results  Component Value Date   WBC 6.1 01/04/2024   HGB 12.2 01/04/2024   HCT 36.0 01/04/2024   MCV 96.4 01/04/2024   PLT 327 01/04/2024   Lab Results  Component Value Date   NA 142 01/11/2024   K 4.1 01/11/2024   CO2 29 01/11/2024   GLUCOSE 130 (H) 01/11/2024   BUN 30 (H) 01/11/2024   CREATININE 1.36 (H) 01/11/2024   BILITOT 0.7 01/04/2024   ALKPHOS 105 01/04/2024   AST 25 01/04/2024   ALT 28 01/04/2024   PROT 6.1 (L) 01/04/2024   ALBUMIN  2.2 (L) 01/04/2024   CALCIUM  7.4 (L) 01/11/2024   ANIONGAP 10 01/11/2024   EGFR 59 (L) 06/13/2023   Lab Results  Component Value Date   CHOL 241 (H) 01/05/2024   Lab Results  Component Value Date   HDL 52 01/05/2024   Lab Results  Component Value Date   LDLCALC 166 (H) 01/05/2024   Lab Results  Component Value Date   TRIG 115 01/05/2024   Lab Results  Component Value Date   CHOLHDL 4.6 01/05/2024   Lab Results  Component Value Date   HGBA1C 6.7 (H) 08/06/2023      Assessment & Plan:   Assessment & Plan 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 MRI showed remote infarcts indicating past strokes. Focus on rehabilitation and stroke prevention. - Refer to occupational therapy, physical therapy, and speech therapy. - Consider outpatient neurology follow-up if no improvement with rehabilitation. - Restart atorvastatin . - Initiate baby aspirin .  Hyperlipidemia Restarting atorvastatin  is  crucial for stroke prevention by reducing cholesterol and preventing plaque buildup. - Restart atorvastatin .  Systolic (congestive) heart failure Ejection fraction improved to 30-35%. - Continue torsemide , carvedilol , Entresto , Farxiga , digoxin , and spironolactone .  Type 2 diabetes mellitus with diabetic chronic kidney disease Diabetes well-controlled with A1c at 6.7. Slightly decreased kidney function likely due to diuretics. - Continue Ozempic  and Farxiga . - Perform blood work and urine test to monitor kidney function and A1c. - Refill Farxiga  prescription.  - CBC w/Diff/Platelet - Basic Metabolic Panel (BMET) - HgB A1c - Urine Microalbumin w/creat. ratio - dapagliflozin  propanediol (FARXIGA ) 10 MG TABS tablet; Take 1 tablet (10 mg total) by mouth once daily.  Dispense: 90 tablet; Refill: 1 - atorvastatin  (LIPITOR) 20 MG tablet; Take 1 tablet (20 mg total) by mouth daily.  Dispense: 90 tablet; Refill: 3   Follow-up: Return in about 4 weeks (around 02/13/2024).    Sharyle Fischer, DO

## 2024-01-17 LAB — CBC WITH DIFFERENTIAL/PLATELET
Absolute Lymphocytes: 884 {cells}/uL (ref 850–3900)
Absolute Monocytes: 371 {cells}/uL (ref 200–950)
Basophils Absolute: 80 {cells}/uL (ref 0–200)
Basophils Relative: 1.4 %
Eosinophils Absolute: 148 {cells}/uL (ref 15–500)
Eosinophils Relative: 2.6 %
HCT: 36.5 % (ref 35.0–45.0)
Hemoglobin: 11 g/dL — ABNORMAL LOW (ref 11.7–15.5)
MCH: 28.4 pg (ref 27.0–33.0)
MCHC: 30.1 g/dL — ABNORMAL LOW (ref 32.0–36.0)
MCV: 94.1 fL (ref 80.0–100.0)
MPV: 9.7 fL (ref 7.5–12.5)
Monocytes Relative: 6.5 %
Neutro Abs: 4218 {cells}/uL (ref 1500–7800)
Neutrophils Relative %: 74 %
Platelets: 373 Thousand/uL (ref 140–400)
RBC: 3.88 Million/uL (ref 3.80–5.10)
RDW: 15.6 % — ABNORMAL HIGH (ref 11.0–15.0)
Total Lymphocyte: 15.5 %
WBC: 5.7 Thousand/uL (ref 3.8–10.8)

## 2024-01-17 LAB — HEMOGLOBIN A1C
Hgb A1c MFr Bld: 8.4 % — ABNORMAL HIGH (ref <5.7)
Mean Plasma Glucose: 194 mg/dL
eAG (mmol/L): 10.8 mmol/L

## 2024-01-17 LAB — BASIC METABOLIC PANEL WITH GFR
BUN/Creatinine Ratio: 21 (calc) (ref 6–22)
BUN: 27 mg/dL — ABNORMAL HIGH (ref 7–25)
CO2: 34 mmol/L — ABNORMAL HIGH (ref 20–32)
Calcium: 7.7 mg/dL — ABNORMAL LOW (ref 8.6–10.2)
Chloride: 103 mmol/L (ref 98–110)
Creat: 1.3 mg/dL — ABNORMAL HIGH (ref 0.50–0.97)
Glucose, Bld: 209 mg/dL — ABNORMAL HIGH (ref 65–99)
Potassium: 5 mmol/L (ref 3.5–5.3)
Sodium: 140 mmol/L (ref 135–146)
eGFR: 54 mL/min/1.73m2 — ABNORMAL LOW (ref >=60)

## 2024-01-17 LAB — MICROALBUMIN / CREATININE URINE RATIO
Creatinine, Urine: 37 mg/dL (ref 20–275)
Microalb Creat Ratio: 5230 mg/g{creat} — ABNORMAL HIGH (ref <30)
Microalb, Ur: 193.5 mg/dL

## 2024-01-18 ENCOUNTER — Ambulatory Visit: Payer: Self-pay | Admitting: Internal Medicine

## 2024-01-19 NOTE — Progress Notes (Signed)
Remote ICD Transmission.

## 2024-01-23 ENCOUNTER — Other Ambulatory Visit: Payer: Self-pay

## 2024-01-24 ENCOUNTER — Telehealth: Payer: Self-pay | Admitting: Family

## 2024-01-24 NOTE — Telephone Encounter (Signed)
 Called to confirm/remind patient of their appointment at the [facility] on 01/25/24.   Appointment:   [] Confirmed  [] Left mess   [x] No answer/No voice mail  [] VM Full/unable to leave message  [] Phone not in service  Patient reminded to bring all medications and/or complete list.  Confirmed patient has transportation. Gave directions, instructed to utilize valet parking.

## 2024-01-25 ENCOUNTER — Ambulatory Visit (HOSPITAL_BASED_OUTPATIENT_CLINIC_OR_DEPARTMENT_OTHER): Payer: MEDICAID | Admitting: Family

## 2024-01-25 ENCOUNTER — Ambulatory Visit: Payer: Self-pay | Admitting: Family

## 2024-01-25 ENCOUNTER — Other Ambulatory Visit: Payer: Self-pay

## 2024-01-25 ENCOUNTER — Other Ambulatory Visit
Admission: RE | Admit: 2024-01-25 | Discharge: 2024-01-25 | Disposition: A | Discharge status: Home / Self Care | Payer: MEDICAID | Source: Ambulatory Visit | Attending: Family | Admitting: Family

## 2024-01-25 ENCOUNTER — Encounter: Payer: Self-pay | Admitting: Family

## 2024-01-25 VITALS — BP 157/106 | HR 74 | Wt 252.0 lb

## 2024-01-25 DIAGNOSIS — D509 Iron deficiency anemia, unspecified: Secondary | ICD-10-CM

## 2024-01-25 DIAGNOSIS — I5020 Unspecified systolic (congestive) heart failure: Secondary | ICD-10-CM

## 2024-01-25 DIAGNOSIS — F1910 Other psychoactive substance abuse, uncomplicated: Secondary | ICD-10-CM

## 2024-01-25 DIAGNOSIS — E1122 Type 2 diabetes mellitus with diabetic chronic kidney disease: Secondary | ICD-10-CM | POA: Diagnosis not present

## 2024-01-25 DIAGNOSIS — Z5900 Homelessness unspecified: Secondary | ICD-10-CM

## 2024-01-25 DIAGNOSIS — I10 Essential (primary) hypertension: Secondary | ICD-10-CM

## 2024-01-25 DIAGNOSIS — R4701 Aphasia: Secondary | ICD-10-CM

## 2024-01-25 DIAGNOSIS — N1831 Chronic kidney disease, stage 3a: Secondary | ICD-10-CM

## 2024-01-25 LAB — BASIC METABOLIC PANEL WITH GFR
Anion gap: 10 (ref 5–15)
BUN: 29 mg/dL — ABNORMAL HIGH (ref 6–20)
CO2: 28 mmol/L (ref 22–32)
Calcium: 8.1 mg/dL — ABNORMAL LOW (ref 8.9–10.3)
Chloride: 101 mmol/L (ref 98–111)
Creatinine, Ser: 1.38 mg/dL — ABNORMAL HIGH (ref 0.44–1.00)
GFR, Estimated: 50 mL/min — ABNORMAL LOW (ref >60)
Glucose, Bld: 173 mg/dL — ABNORMAL HIGH (ref 70–99)
Potassium: 4.7 mmol/L (ref 3.5–5.1)
Sodium: 139 mmol/L (ref 135–145)

## 2024-01-25 MED ORDER — TORSEMIDE 20 MG PO TABS
60.0000 mg | ORAL_TABLET | Freq: Every day | ORAL | 3 refills | Status: DC
  Filled 2024-01-25: qty 270, 90d supply, fill #0
  Filled 2024-01-25: qty 90, 90d supply, fill #0
  Filled 2024-01-26: qty 270, 90d supply, fill #0

## 2024-01-25 MED ORDER — DAPAGLIFLOZIN PROPANEDIOL 10 MG PO TABS
10.0000 mg | ORAL_TABLET | Freq: Every day | ORAL | 11 refills | Status: DC
  Filled 2024-01-25: qty 30, 30d supply, fill #0

## 2024-01-25 MED ORDER — SACUBITRIL-VALSARTAN 97-103 MG PO TABS
1.0000 | ORAL_TABLET | Freq: Two times a day (BID) | ORAL | 11 refills | Status: DC
  Filled 2024-01-25: qty 60, 30d supply, fill #0

## 2024-01-25 MED ORDER — FERROUS SULFATE 325 (65 FE) MG PO TABS
325.0000 mg | ORAL_TABLET | Freq: Two times a day (BID) | ORAL | 11 refills | Status: AC
  Filled 2024-01-25: qty 60, 30d supply, fill #0

## 2024-01-25 NOTE — Patient Instructions (Signed)
 Medication Changes:  No medication changes.  We sent your refills to [facility] pharmacy.   Lab Work:  Go over to the [facility]. Go pass the gift shop and have your blood work completed.  We will only call you if the results are abnormal or if the provider would like to make medication changes.  No news is good news.   Follow-Up in: 1 month with [name], FNP.   Thank you for choosing [facility].    At the [facility], you and your health needs are our priority. We have a designated team specialized in the treatment of Heart Failure. This Care Team includes your primary Heart Failure Specialized Cardiologist (physician), Advanced Practice Providers (APPs- Physician Assistants and Nurse Practitioners), and Pharmacist who all work together to provide you with the care you need, when you need it.   You may see any of the following providers on your designated Care Team at your next follow up:  Dr. Toribio Fuel Dr. Ezra Shuck Dr. Ria Commander Dr. Morene Brownie [name], FNP Jaun Bash, RPH-CPP  Please be sure to bring in all your medications bottles to every appointment.   Need to Contact Us :  If you have any questions or concerns before your next appointment please send us  a message through [place] or call our office at [phone].    TO LEAVE A MESSAGE FOR THE NURSE SELECT OPTION 2, PLEASE LEAVE A MESSAGE INCLUDING: YOUR NAME DATE OF BIRTH CALL BACK NUMBER REASON FOR CALL**this is important as we prioritize the call backs  YOU WILL RECEIVE A CALL BACK THE SAME DAY AS LONG AS YOU CALL BEFORE 4:00 PM It was good to see you today!  Changing torsemide  to a 60mg  tablet once daily.
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
## 2024-01-26 ENCOUNTER — Other Ambulatory Visit: Payer: Self-pay

## 2024-02-01 ENCOUNTER — Other Ambulatory Visit: Payer: Self-pay

## 2024-02-13 ENCOUNTER — Ambulatory Visit: Payer: MEDICAID | Admitting: Internal Medicine

## 2024-02-20 ENCOUNTER — Emergency Department: Payer: MEDICAID

## 2024-02-20 ENCOUNTER — Inpatient Hospital Stay
Admission: EM | Admit: 2024-02-20 | Discharge: 2024-02-23 | DRG: 065 | Disposition: A | Discharge status: Home / Self Care | Payer: MEDICAID | Attending: Internal Medicine | Admitting: Internal Medicine

## 2024-02-20 ENCOUNTER — Encounter (HOSPITAL_COMMUNITY): Payer: Self-pay

## 2024-02-20 DIAGNOSIS — Z5900 Homelessness unspecified: Secondary | ICD-10-CM

## 2024-02-20 DIAGNOSIS — N1831 Chronic kidney disease, stage 3a: Secondary | ICD-10-CM | POA: Diagnosis present

## 2024-02-20 DIAGNOSIS — Z7985 Long-term (current) use of injectable non-insulin antidiabetic drugs: Secondary | ICD-10-CM | POA: Diagnosis not present

## 2024-02-20 DIAGNOSIS — E66812 Obesity, class 2: Secondary | ICD-10-CM | POA: Diagnosis present

## 2024-02-20 DIAGNOSIS — Z833 Family history of diabetes mellitus: Secondary | ICD-10-CM | POA: Diagnosis not present

## 2024-02-20 DIAGNOSIS — R471 Dysarthria and anarthria: Secondary | ICD-10-CM | POA: Diagnosis present

## 2024-02-20 DIAGNOSIS — Z91018 Allergy to other foods: Secondary | ICD-10-CM | POA: Diagnosis not present

## 2024-02-20 DIAGNOSIS — I639 Cerebral infarction, unspecified: Secondary | ICD-10-CM | POA: Diagnosis present

## 2024-02-20 DIAGNOSIS — G8194 Hemiplegia, unspecified affecting left nondominant side: Secondary | ICD-10-CM | POA: Diagnosis present

## 2024-02-20 DIAGNOSIS — Z87891 Personal history of nicotine dependence: Secondary | ICD-10-CM | POA: Diagnosis not present

## 2024-02-20 DIAGNOSIS — I130 Hypertensive heart and chronic kidney disease with heart failure and stage 1 through stage 4 chronic kidney disease, or unspecified chronic kidney disease: Secondary | ICD-10-CM | POA: Diagnosis present

## 2024-02-20 DIAGNOSIS — Z79899 Other long term (current) drug therapy: Secondary | ICD-10-CM

## 2024-02-20 DIAGNOSIS — J449 Chronic obstructive pulmonary disease, unspecified: Secondary | ICD-10-CM | POA: Diagnosis present

## 2024-02-20 DIAGNOSIS — E785 Hyperlipidemia, unspecified: Secondary | ICD-10-CM | POA: Diagnosis present

## 2024-02-20 DIAGNOSIS — E1122 Type 2 diabetes mellitus with diabetic chronic kidney disease: Secondary | ICD-10-CM | POA: Diagnosis present

## 2024-02-20 DIAGNOSIS — I63311 Cerebral infarction due to thrombosis of right middle cerebral artery: Secondary | ICD-10-CM | POA: Diagnosis present

## 2024-02-20 DIAGNOSIS — I63511 Cerebral infarction due to unspecified occlusion or stenosis of right middle cerebral artery: Principal | ICD-10-CM

## 2024-02-20 DIAGNOSIS — Z7902 Long term (current) use of antithrombotics/antiplatelets: Secondary | ICD-10-CM

## 2024-02-20 DIAGNOSIS — R531 Weakness: Secondary | ICD-10-CM

## 2024-02-20 DIAGNOSIS — R29706 NIHSS score 6: Secondary | ICD-10-CM | POA: Diagnosis present

## 2024-02-20 DIAGNOSIS — Z95810 Presence of automatic (implantable) cardiac defibrillator: Secondary | ICD-10-CM

## 2024-02-20 DIAGNOSIS — I10 Essential (primary) hypertension: Secondary | ICD-10-CM | POA: Diagnosis present

## 2024-02-20 DIAGNOSIS — R4701 Aphasia: Secondary | ICD-10-CM | POA: Diagnosis present

## 2024-02-20 DIAGNOSIS — Z8249 Family history of ischemic heart disease and other diseases of the circulatory system: Secondary | ICD-10-CM | POA: Diagnosis not present

## 2024-02-20 DIAGNOSIS — I6389 Other cerebral infarction: Secondary | ICD-10-CM | POA: Diagnosis not present

## 2024-02-20 DIAGNOSIS — I5022 Chronic systolic (congestive) heart failure: Secondary | ICD-10-CM | POA: Diagnosis present

## 2024-02-20 DIAGNOSIS — R4702 Dysphasia: Secondary | ICD-10-CM | POA: Diagnosis present

## 2024-02-20 DIAGNOSIS — I428 Other cardiomyopathies: Secondary | ICD-10-CM | POA: Diagnosis present

## 2024-02-20 DIAGNOSIS — I5020 Unspecified systolic (congestive) heart failure: Secondary | ICD-10-CM | POA: Diagnosis present

## 2024-02-20 DIAGNOSIS — Z6838 Body mass index (BMI) 38.0-38.9, adult: Secondary | ICD-10-CM

## 2024-02-20 DIAGNOSIS — Z9049 Acquired absence of other specified parts of digestive tract: Secondary | ICD-10-CM

## 2024-02-20 LAB — COMPREHENSIVE METABOLIC PANEL WITH GFR
ALT: 16 U/L (ref 0–44)
AST: 23 U/L (ref 15–41)
Albumin: 2.2 g/dL — ABNORMAL LOW (ref 3.5–5.0)
Alkaline Phosphatase: 77 U/L (ref 38–126)
Anion gap: 14 (ref 5–15)
BUN: 29 mg/dL — ABNORMAL HIGH (ref 6–20)
CO2: 22 mmol/L (ref 22–32)
Calcium: 8.1 mg/dL — ABNORMAL LOW (ref 8.9–10.3)
Chloride: 101 mmol/L (ref 98–111)
Creatinine, Ser: 1.6 mg/dL — ABNORMAL HIGH (ref 0.44–1.00)
GFR, Estimated: 42 mL/min — ABNORMAL LOW (ref >60)
Glucose, Bld: 169 mg/dL — ABNORMAL HIGH (ref 70–99)
Potassium: 4.4 mmol/L (ref 3.5–5.1)
Sodium: 137 mmol/L (ref 135–145)
Total Bilirubin: 0.5 mg/dL (ref 0.0–1.2)
Total Protein: 6.3 g/dL — ABNORMAL LOW (ref 6.5–8.1)

## 2024-02-20 LAB — HCG, QUANTITATIVE, PREGNANCY: hCG, Beta Chain, Quant, S: 1 m[IU]/mL (ref <5)

## 2024-02-20 LAB — CBC
HCT: 39.5 % (ref 36.0–46.0)
Hemoglobin: 11.8 g/dL — ABNORMAL LOW (ref 12.0–15.0)
MCH: 28.4 pg (ref 26.0–34.0)
MCHC: 29.9 g/dL — ABNORMAL LOW (ref 30.0–36.0)
MCV: 95 fL (ref 80.0–100.0)
Platelets: 310 K/uL (ref 150–400)
RBC: 4.16 MIL/uL (ref 3.87–5.11)
RDW: 14.5 % (ref 11.5–15.5)
WBC: 5.5 K/uL (ref 4.0–10.5)
nRBC: 0 % (ref 0.0–0.2)

## 2024-02-20 LAB — CBG MONITORING, ED: Glucose-Capillary: 165 mg/dL — ABNORMAL HIGH (ref 70–99)

## 2024-02-20 LAB — APTT: aPTT: 32 s (ref 24–36)

## 2024-02-20 LAB — PROTIME-INR
INR: 1 (ref 0.8–1.2)
Prothrombin Time: 14.1 s (ref 11.4–15.2)

## 2024-02-20 MED ORDER — STROKE: EARLY STAGES OF RECOVERY BOOK: Freq: Once | Status: AC

## 2024-02-20 MED ORDER — CARVEDILOL 6.25 MG PO TABS
12.5000 mg | ORAL_TABLET | Freq: Two times a day (BID) | ORAL | Status: DC
  Administered 2024-02-21: 12.5 mg via ORAL
  Filled 2024-02-20: qty 2

## 2024-02-20 MED ORDER — DIGOXIN 125 MCG PO TABS
0.0625 mg | ORAL_TABLET | Freq: Every day | ORAL | Status: DC
  Administered 2024-02-21 – 2024-02-23 (×3): 0.0625 mg via ORAL
  Filled 2024-02-20 (×3): qty 0.5

## 2024-02-20 MED ORDER — IOHEXOL 350 MG/ML SOLN
100.0000 mL | Freq: Once | INTRAVENOUS | Status: AC | PRN
  Administered 2024-02-20: 100 mL via INTRAVENOUS

## 2024-02-20 MED ORDER — CHLORHEXIDINE GLUCONATE CLOTH 2 % EX PADS
6.0000 | MEDICATED_PAD | Freq: Every day | CUTANEOUS | Status: DC
  Administered 2024-02-20 – 2024-02-22 (×3): 6 via TOPICAL
  Filled 2024-02-20: qty 6

## 2024-02-20 MED ORDER — MAGNESIUM HYDROXIDE 400 MG/5ML PO SUSP: 30.0000 mL | Freq: Every day | ORAL | Status: DC | PRN

## 2024-02-20 MED ORDER — CLOPIDOGREL BISULFATE 75 MG PO TABS
300.0000 mg | ORAL_TABLET | Freq: Once | ORAL | Status: AC
  Administered 2024-02-20: 300 mg via ORAL
  Filled 2024-02-20: qty 4

## 2024-02-20 MED ORDER — ENOXAPARIN SODIUM 60 MG/0.6ML IJ SOSY
55.0000 mg | PREFILLED_SYRINGE | INTRAMUSCULAR | Status: DC
Start: 2024-02-21 — End: 2024-02-23
  Administered 2024-02-21 – 2024-02-23 (×3): 55 mg via SUBCUTANEOUS
  Filled 2024-02-20 (×3): qty 0.6

## 2024-02-20 MED ORDER — TORSEMIDE 20 MG PO TABS
60.0000 mg | ORAL_TABLET | Freq: Every day | ORAL | Status: DC
  Administered 2024-02-21: 60 mg via ORAL
  Filled 2024-02-20: qty 3

## 2024-02-20 MED ORDER — FERROUS SULFATE 325 (65 FE) MG PO TABS
325.0000 mg | ORAL_TABLET | Freq: Two times a day (BID) | ORAL | Status: DC
  Administered 2024-02-21 – 2024-02-23 (×6): 325 mg via ORAL
  Filled 2024-02-20 (×6): qty 1

## 2024-02-20 MED ORDER — ASPIRIN 81 MG PO TBEC
81.0000 mg | DELAYED_RELEASE_TABLET | Freq: Once | ORAL | Status: AC
  Administered 2024-02-20: 81 mg via ORAL
  Filled 2024-02-20: qty 1

## 2024-02-20 MED ORDER — SODIUM CHLORIDE 0.9 % IV SOLN: INTRAVENOUS | Status: DC

## 2024-02-20 MED ORDER — ATORVASTATIN CALCIUM 20 MG PO TABS
20.0000 mg | ORAL_TABLET | Freq: Every day | ORAL | Status: DC
Start: 2024-02-21 — End: 2024-02-22
  Administered 2024-02-21: 20 mg via ORAL
  Filled 2024-02-20 (×2): qty 1

## 2024-02-20 MED ORDER — ONDANSETRON HCL 4 MG/2ML IJ SOLN: 4.0000 mg | Freq: Four times a day (QID) | INTRAMUSCULAR | Status: DC | PRN

## 2024-02-20 MED ORDER — ONDANSETRON HCL 4 MG PO TABS: 4.0000 mg | ORAL_TABLET | Freq: Four times a day (QID) | ORAL | Status: DC | PRN

## 2024-02-20 MED ORDER — SPIRONOLACTONE 25 MG PO TABS: 25.0000 mg | ORAL_TABLET | Freq: Every day | ORAL | Status: DC

## 2024-02-20 MED ORDER — SACUBITRIL-VALSARTAN 97-103 MG PO TABS
1.0000 | ORAL_TABLET | Freq: Two times a day (BID) | ORAL | Status: DC
  Filled 2024-02-20 (×2): qty 1

## 2024-02-20 MED ORDER — ACETAMINOPHEN 650 MG RE SUPP: 650.0000 mg | Freq: Four times a day (QID) | RECTAL | Status: DC | PRN

## 2024-02-20 MED ORDER — ACETAMINOPHEN 325 MG PO TABS
650.0000 mg | ORAL_TABLET | Freq: Four times a day (QID) | ORAL | Status: DC | PRN
  Administered 2024-02-21 – 2024-02-22 (×2): 650 mg via ORAL
  Filled 2024-02-20 (×2): qty 2

## 2024-02-20 MED ORDER — TRAZODONE HCL 50 MG PO TABS: 25.0000 mg | ORAL_TABLET | Freq: Every evening | ORAL | Status: DC | PRN

## 2024-02-20 MED ORDER — DAPAGLIFLOZIN PROPANEDIOL 10 MG PO TABS
10.0000 mg | ORAL_TABLET | Freq: Every day | ORAL | Status: DC
  Administered 2024-02-21 – 2024-02-23 (×3): 10 mg via ORAL
  Filled 2024-02-20 (×3): qty 1

## 2024-02-20 NOTE — H&P (Incomplete)
 [place]   PATIENT NAME: Molly Howard    MR#:  [id number]  DATE OF BIRTH:  06-Jun-1985  DATE OF ADMISSION:  02/20/2024  PRIMARY CARE PHYSICIAN: [name], DO   Patient is coming from: Home  REQUESTING/REFERRING PHYSICIAN: Ward, Josette HERO, DO  CHIEF COMPLAINT:   Chief Complaint  Patient presents with  . Code Stroke    HISTORY OF PRESENT ILLNESS:  Molly Howard is a 38 y.o. African-American female with medical history significant for systolic CHF, status post AICD, CVA, type 2 diabetes mellitus, stage IIIa chronic kidney disease and hypertension as well as COPD, tobacco abuse and polysubstance abuse, who presented to the ER with acute onset of left-sided weakness that started around 6-7 PM with associated paresthesias and dysarthria and expressive dysphasia.  The patient denied any headache or dizziness or blurred vision.  No urinary or stool incontinence.  No tinnitus or vertigo.  No chest pain or palpitations.  No cough or wheezing or dyspnea.  She denies any nausea or vomiting or abdominal pain.  No bleeding diathesis.  ED Course: When the patient was in the ER, BP was 141/113 with otherwise normal vital signs.  Labs revealed BUN 29 with creatinine 1.6 above previous levels and glucose 169 with calcium  8.1 total bili 6.3 with albumin  2.2 and otherwise unremarkable CMP.  CBC showed hemoglobin 11.8 and hematocrit 39.5 above previous levels.  Coag profile was normal. EKG as reviewed by me :  EKG showed sinus rhythm with a rate of 87 with left atrial enlargement provide progression with Q waves anteriorly. Imaging:  CT of the head without contrast revealed: 1. Subacute left frontal lobe infarct, with increased size of the hypodense region compared to 01/04/24, possibly reflecting an early subacute component superimposed on a late subacute to early chronic infarct in this region. MRI recommended. 2. No acute intracranial hemorrhage. 3. Chronic left cerebellar infarct,  unchanged.  CTA of the head and neck revealed the following: 1. Right MCA M2 branch occlusion within the right insula. 2. CT perfusion demonstrates a 42 mL region of hypoperfusion without core infarct. 3. No perfusion abnormality at the site of hypodensity in the left frontal lobe, which is more likely chronic. 4. Calcific atherosclerosis at both carotid bifurcations without hemodynamically significant stenosis by NASCET criteria. 5. Right nasopharyngeal soft tissue asymmetry, recommend direct visualization.  The patient was given loading dose of 300 mg of p.o. Plavix after neurology consultation and 81 mg p.o. aspirin .  Recommendation was for neurochecks every 2 hours for 12 hours he will be admitted to a stepdown unit bed for further evaluation and management. PAST MEDICAL HISTORY:   Past Medical History:  Diagnosis Date  . Acid reflux   . Chronic HFrEF (heart failure with reduced ejection fraction) (HCC)    a. 10/2019 Echo: EF 35-40%, GrII DD; b. 05/2020 Echo: EF 20-25%, glob HK; c. 10/2020 Echo: EF 30-35%, glob HK. GrII DD, Mildly red RV fxn. Mod TR; d.  05/2021 cMRI: EF 42%, no LGE. Nl RV size/fxn.  . CKD (chronic kidney disease), stage II   . Diabetes mellitus (HCC)   . H/O medication noncompliance   . Hypertension   . Microcytic anemia   . NICM (nonischemic cardiomyopathy) (HCC)    a. 10/2019 Echo: EF 35-40%; b. 10/2019 MV: No ischemia. Small apical defect-->breast attenuation; c. 05/2020 Echo: EF 20-25%; d. 10/2020 Echo: EF 30-35%, glob HK. GrII DD, Mildly red RV fxn. Mod TR; e. 05/2021 cMRI: EF 42%,  no LGE. Nl RV size/fxn.  . Obesity   . Polysubstance abuse (HCC)     PAST SURGICAL HISTORY:   Past Surgical History:  Procedure Laterality Date  . CHOLECYSTECTOMY    . HERNIA REPAIR    . ICD IMPLANT    . RIGHT HEART CATH N/A 07/28/2022   Procedure: RIGHT HEART CATH;  Surgeon: Darron Deatrice LABOR, MD;  Location: [facility] INVASIVE CV LAB;  Service: Cardiovascular;  Laterality: N/A;  . SUBQ  ICD IMPLANT N/A 06/27/2023   Procedure: SUBQ ICD IMPLANT;  Surgeon: Cindie Ole DASEN, MD;  Location: [facility] INVASIVE CV LAB;  Service: Cardiovascular;  Laterality: N/A;    SOCIAL HISTORY:   Social History   Tobacco Use  . Smoking status: Former    Current packs/day: 0.00    Types: Cigarettes    Quit date: 2022    Years since quitting: 3.8  . Smokeless tobacco: Never  Substance Use Topics  . Alcohol use: Not Currently    FAMILY HISTORY:   Family History  Problem Relation Age of Onset  . Heart failure Mother        Onset of heart failure 40s.  Died in 05/17/22.  . Diabetes Mother   . Hypertension Father   . Diabetes Father     DRUG ALLERGIES:   Allergies  Allergen Reactions  . Other Rash    Lemons     REVIEW OF SYSTEMS:   ROS As per history of present illness. All pertinent systems were reviewed above. Constitutional, HEENT, cardiovascular, respiratory, GI, GU, musculoskeletal, neuro, psychiatric, endocrine, integumentary and hematologic systems were reviewed and are otherwise negative/unremarkable except for positive findings mentioned above in the HPI.   MEDICATIONS AT HOME:   Prior to Admission medications   Medication Sig Start Date End Date Taking? Authorizing Provider  Accu-Chek Softclix Lancets lancets Use as instructed 11/18/22   [name], DO  atorvastatin  (LIPITOR) 20 MG tablet Take 1 tablet (20 mg total) by mouth daily. 01/16/24   [name], DO  carvedilol  (COREG ) 12.5 MG tablet Take 1 tablet (12.5 mg total) by mouth 2 (two) times daily with a meal. 10/12/23   Tobie Calix, MD  Continuous Glucose Sensor (FREESTYLE LIBRE 3 SENSOR) MISC Place 1 sensor on the skin every 14 days. Use to check glucose continuously 01/14/23   [name], DO  dapagliflozin  propanediol (FARXIGA ) 10 MG TABS tablet Take 1 tablet (10 mg total) by mouth once daily. 01/25/24   Donette Ellouise LABOR, FNP  digoxin  (LANOXIN ) 0.125 MG tablet Take 0.5 tablets (0.0625 mg  total) by mouth daily. 01/03/24   Donette Ellouise LABOR, FNP  ferrous sulfate  325 (65 FE) MG tablet Take 1 tablet (325 mg total) by mouth 2 (two) times daily. 01/25/24 01/24/25  Donette Ellouise LABOR, FNP  sacubitril -valsartan  (ENTRESTO ) 97-103 MG Take 1 tablet by mouth 2 (two) times daily. 01/25/24   Donette Ellouise LABOR, FNP  Semaglutide ,0.25 or 0.5MG /DOS, 2 MG/3ML SOPN Inject 0.25 mg into the skin once a week. 01/03/24   Donette Ellouise LABOR, FNP  spironolactone  (ALDACTONE ) 25 MG tablet Take 1 tablet (25 mg total) by mouth daily. 01/03/24   Donette Ellouise LABOR, FNP  torsemide  (DEMADEX ) 20 MG tablet Take 3 tablets (60 mg total) by mouth daily. 01/25/24   Donette Ellouise LABOR, FNP  Potassium Chloride  40 MEQ/15ML (20%) SOLN Take 40 mEq by mouth 2 (two) times daily. Patient not taking: Reported on 06/07/2023 07/04/20 10/18/20  Darliss Rogue, MD      VITAL SIGNS:  Blood pressure 128/83, pulse 73, temperature 98.7 F (37.1 C), temperature source Oral, resp. rate 15, height 5' 7 (1.702 m), weight 112 kg, SpO2 98%.  PHYSICAL EXAMINATION:  Physical Exam  GENERAL:  38 y.o.-year-old African-American female patient lying in the bed with no acute distress.  EYES: Pupils equal, round, reactive to light and accommodation. No scleral icterus. Extraocular muscles intact.  HEENT: Head atraumatic, normocephalic. Oropharynx and nasopharynx clear.  NECK:  Supple, no jugular venous distention. No thyroid  enlargement, no tenderness.  LUNGS: Normal breath sounds bilaterally, no wheezing, rales,rhonchi or crepitation. No use of accessory muscles of respiration.  CARDIOVASCULAR: Regular rate and rhythm, S1, S2 normal. No murmurs, rubs, or gallops.  ABDOMEN: Soft, nondistended, nontender. Bowel sounds present. No organomegaly or mass.  EXTREMITIES: No pedal edema, cyanosis, or clubbing.  NEUROLOGIC: Cranial nerves II through XII are intact except for dysarthria and expressive dysphasia. Muscle strength 3 /5 in the left upper and lower extremities.  Sensation intact. Gait not checked.  PSYCHIATRIC: The patient is alert and oriented x 3.  Normal affect and good eye contact. SKIN: No obvious rash, lesion, or ulcer.   LABORATORY PANEL:   CBC Recent Labs  Lab 02/21/24 0438  WBC 4.6  HGB 11.2*  HCT 36.8  PLT 262   ------------------------------------------------------------------------------------------------------------------  Chemistries  Recent Labs  Lab 02/20/24 2058  NA 137  K 4.4  CL 101  CO2 22  GLUCOSE 169*  BUN 29*  CREATININE 1.60*  CALCIUM  8.1*  AST 23  ALT 16  ALKPHOS 77  BILITOT 0.5   ------------------------------------------------------------------------------------------------------------------  Cardiac Enzymes No results for input(s): TROPONINI in the last 168 hours. ------------------------------------------------------------------------------------------------------------------  RADIOLOGY:  CT ANGIO HEAD NECK W WO CM W PERF (CODE STROKE) Result Date: 02/20/2024 EXAM: CTA Head and Neck with Perfusion 02/20/2024 09:11:53 PM TECHNIQUE: CTA of the head and neck was performed with and without the administration of 100 mL of iohexol  (OMNIPAQUE ) 350 MG/ML injection. 3D postprocessing with multiplanar reconstructions and MIPs was performed to evaluate the vascular anatomy. Cerebral perfusion analysis using computed tomography with contrast administration, including post-processing of parametric maps with determination of cerebral blood flow, cerebral blood volume, mean transit time and time-to-maximum. Automated exposure control, iterative reconstruction, and/or weight based adjustment of the mA/kV was utilized to reduce the radiation dose to as low as reasonably achievable. COMPARISON: 01/04/2024 CLINICAL HISTORY: Acute neuro deficit, stroke suspected, with left weakness and headache. Patient presented with stroke-like symptoms, including left-sided deficits, slow and slurred speech, headache, chest pain, and  shortness of breath. History of stroke 4 days prior with left-sided deficits and pulmonary embolism (PE) without treatment, discharged 2 days ago. FINDINGS: CTA NECK: AORTIC ARCH AND ARCH VESSELS: No dissection or arterial injury. No significant stenosis of the brachiocephalic or subclavian arteries. CERVICAL CAROTID ARTERIES: Calcific atherosclerosis at both carotid bifurcations without hemodynamically significant stenosis by NASCET criteria. No dissection or arterial injury. CERVICAL VERTEBRAL ARTERIES: No dissection, arterial injury, or significant stenosis. LUNGS AND MEDIASTINUM: Unremarkable. SOFT TISSUES: Soft tissue asymmetry within the right nasopharynx recommend correlation with direct visualization. BONES: No acute abnormality. CTA HEAD: ANTERIOR CIRCULATION: No significant stenosis of the internal carotid arteries. No significant stenosis of the anterior cerebral arteries. Occlusion of a right MCA M2 branch within the right insula. Normal left MCA. No aneurysm. POSTERIOR CIRCULATION: No significant stenosis of the posterior cerebral arteries. No significant stenosis of the basilar artery. No significant stenosis of the vertebral arteries. No aneurysm. OTHER: No dural venous sinus thrombosis on this non-dedicated  study. CT PERFUSION: EXAM QUALITY: Exam quality is adequate with diagnostic perfusion maps. No significant motion artifact. Appropriate arterial inflow and venous outflow curves. CORE INFARCT (CBF<30% volume): 0 mL TOTAL HYPOPERFUSION (Tmax>6s volume): 42 mL PENUMBRA: Mismatch volume: 42 mL Mismatch ratio: Not applicable Location: Posterior left MCA territory IMPRESSION: 1. Right MCA M2 branch occlusion within the right insula. 2. CT perfusion demonstrates a 42 mL region of hypoperfusion without core infarct. 3. No perfusion abnormality at the site of hypodensity in the left frontal lobe, which is more likely chronic. 4. Calcific atherosclerosis at both carotid bifurcations without hemodynamically  significant stenosis by NASCET criteria. 5. Right nasopharyngeal soft tissue asymmetry, recommend direct visualization. Findings communicated at 9:32 PM on 02/20/2024. Electronically signed by: Franky Stanford MD 02/20/2024 09:33 PM EDT RP Workstation: [id number]   CT HEAD CODE STROKE WO CONTRAST (LKW 0-4.5h, LVO 0-24h) Result Date: 02/20/2024 EXAM: CT HEAD WITHOUT CONTRAST 02/20/2024 08:54:32 PM TECHNIQUE: CT of the head was performed without the administration of intravenous contrast. Automated exposure control, iterative reconstruction, and/or weight based adjustment of the mA/kV was utilized to reduce the radiation dose to as low as reasonably achievable. COMPARISON: 01/04/2024 CLINICAL HISTORY: Neuro deficit, acute, stroke suspected. FINDINGS: BRAIN AND VENTRICLES: No acute hemorrhage. Subacute left frontal lobe infarct, with increased size of hypodense region compared to 01/04/24, which could indicate an early subacute infarct superimposed on the late subacute/early chronic infarct in this region. Chronic left cerebellar infarct is unchanged. No hydrocephalus. No extra-axial collection. No mass effect or midline shift. ASPECTS is 9. ORBITS: No acute abnormality. SINUSES: No acute abnormality. SOFT TISSUES AND SKULL: No acute soft tissue abnormality. No skull fracture. IMPRESSION: 1. Subacute left frontal lobe infarct, with increased size of the hypodense region compared to 01/04/24, possibly reflecting an early subacute component superimposed on a late subacute to early chronic infarct in this region. MRI recommended. 2. No acute intracranial hemorrhage. 3. Chronic left cerebellar infarct, unchanged. 4. ASPECTS is 9. 5. Findings communicated to Dr. Suzanne at 9:05 pm on 02/20/2024. Electronically signed by: Franky Stanford MD 02/20/2024 09:06 PM EDT RP Workstation: [id number]      IMPRESSION AND PLAN:  Assessment and Plan: * Acute CVA (cerebrovascular accident) (HCC) - This is manifested by left-sided  hemiparalysis and numbness with dysarthria and expressive dysphasia. - The patient will be admitted to an observation medically monitored bed.   - We will follow neuro checks q.4 hours for 24 hours.   - The patient will be placed on aspirin  and Plavix.   - Will obtain a 2D echo with bubble study .  Since her head CT scan showed her CVA and she has a pacemaker, we did not order brain MRI. - A neurology consultation  as well as physical/occupation/speech therapy consults will be obtained in a.m..  I notified Dr. Matthews about the patient. - The patient will be placed on statin therapy and fasting lipids will be checked.   Dyslipidemia - Continue statin therapy.  Essential hypertension - Will continue antihypertensive therapy.  HFrEF (heart failure with reduced ejection fraction) (HCC) - Will continue digoxin , Farxiga  and Coreg  as well as Aldactone  and Entresto .  She does not have any current exacerbation.       DVT prophylaxis: Lovenox . Advanced Care Planning:  Code Status: full code. Family Communication:  The plan of care was discussed in details with the patient (and family). I answered all questions. The patient agreed to proceed with the above mentioned plan. Further management will depend upon hospital  course. Disposition Plan: Back to previous home environment Consults called: Cardiology All the records are reviewed and case discussed with ED provider.  Status is: Inpatient   At the time of the admission, it appears that the appropriate admission status for this patient is inpatient.  This is judged to be reasonable and necessary in order to provide the required intensity of service to ensure the patient's safety given the presenting symptoms, physical exam findings and initial radiographic and laboratory data in the context of comorbid conditions.  The patient requires inpatient status due to high intensity of service, high risk of further deterioration and high frequency of  surveillance required.  I certify that at the time of admission, it is my clinical judgment that the patient will require inpatient hospital care extending more than 2 midnights.                            Dispo: The patient is from: Home              Anticipated d/c is to: Home              Patient currently is not medically stable to d/c.              Difficult to place patient: No  Madison DELENA Peaches M.D on 02/21/2024 at 5:09 AM  [facility]   From 7 PM-7 AM, contact night-coverage [email or web address]  CC: Primary care physician; [name], DO

## 2024-02-20 NOTE — ED Notes (Signed)
 Code Stroke called in field to [facility] per EMS

## 2024-02-20 NOTE — ED Notes (Signed)
 ED Provider at bedside.

## 2024-02-20 NOTE — Plan of Care (Signed)
 I was called by the ED provider at [facility] and the admitting ICU APP regarding this patient. Patient evaluated by teleneurology. Reportedly 4 days worth of sided weakness.  Evaluated as a code stroke due to worsening of symptoms and slurred speech.  On examination, has some aphasia but no significant focal localizing weakness.  CT shows subacute left frontal lobe infarct with increased size of the hypodense region compared to 01/04/2024 possibly reflecting an early subacute component superimposed on the late subacute to early chronic infarct in this region.  The CT angiography although showed a right MCA M2 branch occlusion within the right insula which on neurointerventionalist Dr. Rosslyn review was more distal-more like an M3 occlusion. The perfusion study demonstrated a 42 mL region of hypoperfusion without a core infarct. Given that her NIH stroke scale of 6 was including 1 point for each limb that was drifting for the teleneurologist, the true deficits are mild and with unclear history or history of 3 to 4 days worth of last known well, and a distal lesion, and emergent thrombectomy is not felt to be favorable per discussion with the interventionalist. I would recommend that she admitted to the stepdown unit with every 2 hour neurochecks and if she has worsening, can then be reimaged but with last known well greater than 24 hours, and acute intervention is less likely at this point.  If there is any discomfort in admitting her to [facility] by any of the team members, please admit to [facility], stepdown unit, hospitalist service with neurology consulting.  This was discussed in detail with Dr. Neomi Eligio Lav, MD On-call neurologist 

## 2024-02-20 NOTE — ED Notes (Signed)
 CCMD notified of cardiac monitoring order.

## 2024-02-20 NOTE — ED Notes (Signed)
 Pts father and friend [place] given update.

## 2024-02-20 NOTE — Progress Notes (Signed)
 PHARMACIST - PHYSICIAN COMMUNICATION  CONCERNING:  Enoxaparin  (Lovenox ) for DVT Prophylaxis    RECOMMENDATION: Patient was prescribed enoxaprin 40mg  q24 hours for VTE prophylaxis.   Filed Weights   02/20/24 2117  Weight: 112 kg (246 lb 14.6 oz)    Body mass index is 38.67 kg/m.  Estimated Creatinine Clearance: 61 mL/min (A) (by C-G formula based on SCr of 1.6 mg/dL (H)).   Based on [facility] policy patient is candidate for enoxaparin  0.5mg /kg TBW SQ every 24 hours based on BMI being >30.  DESCRIPTION: Pharmacy has adjusted enoxaparin  dose per [facility] policy.  Patient is now receiving enoxaparin  0.5 mg/kg every 24 hours   Molly Howard, PharmD, [facility] 02/20/2024 11:28 PM

## 2024-02-20 NOTE — ED Provider Notes (Signed)
 [facility] Provider Note    Event Date/Time   First MD Initiated Contact with Patient 02/20/24 2048     (approximate)   History   Code Stroke   HPI  Molly Howard is a 38 y.o. female with PMH of HFrEF/NICM s/p AICD, CVA, DM-2, CKD-3, HTN, polysubstance use, COPD and tobacco use disorder who presents to the emergency department EMS as a code stroke.  Last known well was 8 PM.  Patient has left-sided weakness, slurred speech, expressive aphasia.  Admitted to the hospital in September 2025 for stroke like symptoms with right-sided deficits.  TNK was not given at that time.  Patient is currently homeless.  She denies using cocaine for 2 years.  Denies any headache, head injury.  History provided by patient, EMS.    Past Medical History:  Diagnosis Date   Acid reflux    Chronic HFrEF (heart failure with reduced ejection fraction) (HCC)    a. 10/2019 Echo: EF 35-40%, GrII DD; b. 05/2020 Echo: EF 20-25%, glob HK; c. 10/2020 Echo: EF 30-35%, glob HK. GrII DD, Mildly red RV fxn. Mod TR; d.  05/2021 cMRI: EF 42%, no LGE. Nl RV size/fxn.   CKD (chronic kidney disease), stage II    Diabetes mellitus (HCC)    H/O medication noncompliance    Hypertension    Microcytic anemia    NICM (nonischemic cardiomyopathy) (HCC)    a. 10/2019 Echo: EF 35-40%; b. 10/2019 MV: No ischemia. Small apical defect-->breast attenuation; c. 05/2020 Echo: EF 20-25%; d. 10/2020 Echo: EF 30-35%, glob HK. GrII DD, Mildly red RV fxn. Mod TR; e. 05/2021 cMRI: EF 42%, no LGE. Nl RV size/fxn.   Obesity    Polysubstance abuse [facility])     Past Surgical History:  Procedure Laterality Date   CHOLECYSTECTOMY     HERNIA REPAIR     ICD IMPLANT     RIGHT HEART CATH N/A 07/28/2022   Procedure: RIGHT HEART CATH;  Surgeon: Darron Deatrice LABOR, MD;  Location: [facility] INVASIVE CV LAB;  Service: Cardiovascular;  Laterality: N/A;   SUBQ ICD IMPLANT N/A 06/27/2023   Procedure: SUBQ ICD IMPLANT;  Surgeon: Cindie Ole DASEN, MD;  Location: [facility] INVASIVE CV LAB;  Service: Cardiovascular;  Laterality: N/A;    MEDICATIONS:  Prior to Admission medications   Medication Sig Start Date End Date Taking? Authorizing Provider  Accu-Chek Softclix Lancets lancets Use as instructed 11/18/22   [name], DO  atorvastatin  (LIPITOR) 20 MG tablet Take 1 tablet (20 mg total) by mouth daily. 01/16/24   [name], DO  carvedilol  (COREG ) 12.5 MG tablet Take 1 tablet (12.5 mg total) by mouth 2 (two) times daily with a meal. 10/12/23   Tobie Calix, MD  Continuous Glucose Sensor (FREESTYLE LIBRE 3 SENSOR) MISC Place 1 sensor on the skin every 14 days. Use to check glucose continuously 01/14/23   [name], DO  dapagliflozin  propanediol (FARXIGA ) 10 MG TABS tablet Take 1 tablet (10 mg total) by mouth once daily. 01/25/24   Donette Ellouise LABOR, FNP  digoxin  (LANOXIN ) 0.125 MG tablet Take 0.5 tablets (0.0625 mg total) by mouth daily. 01/03/24   Donette Ellouise LABOR, FNP  ferrous sulfate  325 (65 FE) MG tablet Take 1 tablet (325 mg total) by mouth 2 (two) times daily. 01/25/24 01/24/25  Donette Ellouise LABOR, FNP  sacubitril -valsartan  (ENTRESTO ) 97-103 MG Take 1 tablet by mouth 2 (two) times daily. 01/25/24   Donette Ellouise LABOR, FNP  Semaglutide ,0.25 or 0.5MG /DOS, 2  MG/3ML SOPN Inject 0.25 mg into the skin once a week. 01/03/24   Donette Ellouise LABOR, FNP  spironolactone  (ALDACTONE ) 25 MG tablet Take 1 tablet (25 mg total) by mouth daily. 01/03/24   Donette Ellouise LABOR, FNP  torsemide  (DEMADEX ) 20 MG tablet Take 3 tablets (60 mg total) by mouth daily. 01/25/24   Donette Ellouise LABOR, FNP  Potassium Chloride  40 MEQ/15ML (20%) SOLN Take 40 mEq by mouth 2 (two) times daily. Patient not taking: Reported on 06/07/2023 07/04/20 10/18/20  Darliss Rogue, MD    Physical Exam   Triage Vital Signs: ED Triage Vitals  Encounter Vitals Group     BP      Girls Systolic BP Percentile      Girls Diastolic BP Percentile      Boys Systolic BP Percentile      Boys  Diastolic BP Percentile      Pulse      Resp      Temp      Temp src      SpO2      Weight      Height      Head Circumference      Peak Flow      Pain Score      Pain Loc      Pain Education      Exclude from Growth Chart     Most recent vital signs: Vitals:   02/20/24 2200 02/20/24 2230  BP: (!) 151/113 (!) 155/107  Pulse: 88 91  Resp: (!) 24 (!) 27  SpO2: 100% 100%    CONSTITUTIONAL: Alert, responds appropriately to questions. Well-appearing; well-nourished HEAD: Normocephalic, atraumatic EYES: Conjunctivae clear, pupils appear equal, sclera nonicteric ENT: normal nose; moist mucous membranes NECK: Supple, normal ROM CARD: RRR; S1 and S2 appreciated RESP: Normal chest excursion without splinting or tachypnea; breath sounds clear and equal bilaterally; no wheezes, no rhonchi, no rales, no hypoxia or respiratory distress, speaking full sentences ABD/GI: Non-distended; soft, non-tender, no rebound, no guarding, no peritoneal signs BACK: The back appears normal EXT: Normal ROM in all joints; no deformity noted, no edema SKIN: Normal color for age and race; warm; no rash on exposed skin NEURO: Normal sensation diffusely, no drift, no appreciable facial asymmetry, mild dysarthria and aphasia PSYCH: The patient's mood and manner are appropriate.   ED Results / Procedures / Treatments   LABS: (all labs ordered are listed, but only abnormal results are displayed) Labs Reviewed  CBC - Abnormal; Notable for the following components:      Result Value   Hemoglobin 11.8 (*)    MCHC 29.9 (*)    All other components within normal limits  COMPREHENSIVE METABOLIC PANEL WITH GFR - Abnormal; Notable for the following components:   Glucose, Bld 169 (*)    BUN 29 (*)    Creatinine, Ser 1.60 (*)    Calcium  8.1 (*)    Total Protein 6.3 (*)    Albumin  2.2 (*)    GFR, Estimated 42 (*)    All other components within normal limits  CBG MONITORING, ED - Abnormal; Notable for the  following components:   Glucose-Capillary 165 (*)    All other components within normal limits  PROTIME-INR  APTT  URINE DRUG SCREEN, QUALITATIVE ([facility] ONLY)  HCG, QUANTITATIVE, PREGNANCY  POC URINE PREG, ED     EKG:  EKG Interpretation Date/Time:  Monday February 20 2024 21:15:15 EDT Ventricular Rate:  87 PR Interval:  155 QRS Duration:  105  QT Interval:  396 QTC Calculation: 477 R Axis:   72  Text Interpretation: Sinus rhythm LAE, consider biatrial enlargement Anterior infarct, old Nonspecific T abnormalities, lateral leads Confirmed by Neomi Neptune [phone]) on 02/20/2024 9:19:28 PM         RADIOLOGY: My personal review and interpretation of imaging: CT shows right MCA occlusion.  No intracranial hemorrhage.  I have personally reviewed all radiology reports.   CT ANGIO HEAD NECK W WO CM W PERF (CODE STROKE) Result Date: 02/20/2024 EXAM: CTA Head and Neck with Perfusion 02/20/2024 09:11:53 PM TECHNIQUE: CTA of the head and neck was performed with and without the administration of 100 mL of iohexol  (OMNIPAQUE ) 350 MG/ML injection. 3D postprocessing with multiplanar reconstructions and MIPs was performed to evaluate the vascular anatomy. Cerebral perfusion analysis using computed tomography with contrast administration, including post-processing of parametric maps with determination of cerebral blood flow, cerebral blood volume, mean transit time and time-to-maximum. Automated exposure control, iterative reconstruction, and/or weight based adjustment of the mA/kV was utilized to reduce the radiation dose to as low as reasonably achievable. COMPARISON: 01/04/2024 CLINICAL HISTORY: Acute neuro deficit, stroke suspected, with left weakness and headache. Patient presented with stroke-like symptoms, including left-sided deficits, slow and slurred speech, headache, chest pain, and shortness of breath. History of stroke 4 days prior with left-sided deficits and pulmonary embolism (PE)  without treatment, discharged 2 days ago. FINDINGS: CTA NECK: AORTIC ARCH AND ARCH VESSELS: No dissection or arterial injury. No significant stenosis of the brachiocephalic or subclavian arteries. CERVICAL CAROTID ARTERIES: Calcific atherosclerosis at both carotid bifurcations without hemodynamically significant stenosis by NASCET criteria. No dissection or arterial injury. CERVICAL VERTEBRAL ARTERIES: No dissection, arterial injury, or significant stenosis. LUNGS AND MEDIASTINUM: Unremarkable. SOFT TISSUES: Soft tissue asymmetry within the right nasopharynx recommend correlation with direct visualization. BONES: No acute abnormality. CTA HEAD: ANTERIOR CIRCULATION: No significant stenosis of the internal carotid arteries. No significant stenosis of the anterior cerebral arteries. Occlusion of a right MCA M2 branch within the right insula. Normal left MCA. No aneurysm. POSTERIOR CIRCULATION: No significant stenosis of the posterior cerebral arteries. No significant stenosis of the basilar artery. No significant stenosis of the vertebral arteries. No aneurysm. OTHER: No dural venous sinus thrombosis on this non-dedicated study. CT PERFUSION: EXAM QUALITY: Exam quality is adequate with diagnostic perfusion maps. No significant motion artifact. Appropriate arterial inflow and venous outflow curves. CORE INFARCT (CBF<30% volume): 0 mL TOTAL HYPOPERFUSION (Tmax>6s volume): 42 mL PENUMBRA: Mismatch volume: 42 mL Mismatch ratio: Not applicable Location: Posterior left MCA territory IMPRESSION: 1. Right MCA M2 branch occlusion within the right insula. 2. CT perfusion demonstrates a 42 mL region of hypoperfusion without core infarct. 3. No perfusion abnormality at the site of hypodensity in the left frontal lobe, which is more likely chronic. 4. Calcific atherosclerosis at both carotid bifurcations without hemodynamically significant stenosis by NASCET criteria. 5. Right nasopharyngeal soft tissue asymmetry, recommend direct  visualization. Findings communicated at 9:32 PM on 02/20/2024. Electronically signed by: Franky Stanford MD 02/20/2024 09:33 PM EDT RP Workstation: [id number]   CT HEAD CODE STROKE WO CONTRAST (LKW 0-4.5h, LVO 0-24h) Result Date: 02/20/2024 EXAM: CT HEAD WITHOUT CONTRAST 02/20/2024 08:54:32 PM TECHNIQUE: CT of the head was performed without the administration of intravenous contrast. Automated exposure control, iterative reconstruction, and/or weight based adjustment of the mA/kV was utilized to reduce the radiation dose to as low as reasonably achievable. COMPARISON: 01/04/2024 CLINICAL HISTORY: Neuro deficit, acute, stroke suspected. FINDINGS: BRAIN AND VENTRICLES: No acute hemorrhage.  Subacute left frontal lobe infarct, with increased size of hypodense region compared to 01/04/24, which could indicate an early subacute infarct superimposed on the late subacute/early chronic infarct in this region. Chronic left cerebellar infarct is unchanged. No hydrocephalus. No extra-axial collection. No mass effect or midline shift. ASPECTS is 9. ORBITS: No acute abnormality. SINUSES: No acute abnormality. SOFT TISSUES AND SKULL: No acute soft tissue abnormality. No skull fracture. IMPRESSION: 1. Subacute left frontal lobe infarct, with increased size of the hypodense region compared to 01/04/24, possibly reflecting an early subacute component superimposed on a late subacute to early chronic infarct in this region. MRI recommended. 2. No acute intracranial hemorrhage. 3. Chronic left cerebellar infarct, unchanged. 4. ASPECTS is 9. 5. Findings communicated to Dr. Suzanne at 9:05 pm on 02/20/2024. Electronically signed by: Franky Stanford MD 02/20/2024 09:06 PM EDT RP Workstation: [id number]     PROCEDURES:  Critical Care performed: Yes, see critical care procedure note(s)   CRITICAL CARE Performed by: Josette Sink   Total critical care time: 30 minutes  Critical care time was exclusive of separately billable procedures  and treating other patients.  Critical care was necessary to treat or prevent imminent or life-threatening deterioration.  Critical care was time spent personally by me on the following activities: development of treatment plan with patient and/or surrogate as well as nursing, discussions with consultants, evaluation of patient's response to treatment, examination of patient, obtaining history from patient or surrogate, ordering and performing treatments and interventions, ordering and review of laboratory studies, ordering and review of radiographic studies, pulse oximetry and re-evaluation of patient's condition.   SABRA1-3 Lead EKG Interpretation  Performed by: Seraj Dunnam, Josette SAILOR, DO Authorized by: Eulon Allnutt, Josette SAILOR, DO     Interpretation: normal     ECG rate:  88   ECG rate assessment: normal     Rhythm: sinus rhythm     Ectopy: none     Conduction: normal       IMPRESSION / MDM / ASSESSMENT AND PLAN / ED COURSE  I reviewed the triage vital signs and the nursing notes.    Patient here as a code stroke.  Last known well 8 PM.  Patient having expressive aphasia, dysarthria, left-sided weakness involving the face, arm and leg.  The patient is on the cardiac monitor to evaluate for evidence of arrhythmia and/or significant heart rate changes.   DIFFERENTIAL DIAGNOSIS (includes but not limited to):   Stroke, intracranial hemorrhage, TIA, substance use disorder, hypertensive urgency, hypertensive emergency, malingering   Patient's presentation is most consistent with acute presentation with potential threat to life or bodily function.   PLAN: Will obtain labs, urine.  Noncontrast CT of the head shows possible subacute left frontal infarct.  Radiologist recommends MRI of the brain.  CTA head and neck, CT perfusion study pending.  Awaiting neurology recommendations.   MEDICATIONS GIVEN IN ED: Medications  iohexol  (OMNIPAQUE ) 350 MG/ML injection 100 mL (100 mLs Intravenous Contrast Given  02/20/24 2110)  clopidogrel (PLAVIX) tablet 300 mg (300 mg Oral Given 02/20/24 2215)  aspirin  EC tablet 81 mg (81 mg Oral Given 02/20/24 2215)     ED COURSE:     9:33 PM  Received call from Dr. Stanford with radiology.  Patient has an acute occlusion of the right MCA.  Will reach out to [facility] neurology for possible transfer and intervention.  10:00 PM  Spoke with tele neurologist, Dr. Hobart, who has spoken to neurointerventional Dr.Janjua at [facility].  After review of the  patient's imaging and case they feel that the lesion is too distal for neurointervention and recommend admission to the ICU here for every hour neurochecks x 24 hours, loaded Plavix 300 mg and aspirin  81 mg now.  No TNK at this time.    10:30 PM  Spoke with our charge nurse, ICU charge nurse and ICU provider.  There was some concern that given the possible LVO positive that patient even if not getting intervention needed to be transferred.  I spoke with Dr. Voncile with neurology at [facility] as well as Dr. Rosslyn with interventional neurology.  Patient would not be a candidate for any neurointervention given this is a distal M3 or M4 lesion and also would not be a TNK candidate given infarct appears subacute and there is some questionable history of recent admission to an outside hospital 4 days ago although I do not see this anywhere in care everywhere.  Per neurology at [facility], patient does not necessarily need transfer to [facility] at this time and can be admitted to stepdown here by our hospitalist for every 2 hours neurochecks.  CONSULTS:  10:43 PM  Consulted and discussed patient's case with hospitalist, Dr. Lawence.  I have recommended admission and consulting physician agrees and will place admission orders.  Patient (and family if present) agree with this plan.   I reviewed all nursing notes, vitals, pertinent previous records.  All labs, EKGs, imaging ordered have been independently reviewed and interpreted by  myself.    OUTSIDE RECORDS REVIEWED: Reviewed last admission in September 2025.       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   Nicolena Schurman, Josette SAILOR, DO 02/20/24 2243

## 2024-02-20 NOTE — Consult Note (Signed)
 [facility] Consult Services   Patient Name:   Molly Howard, Molly Howard Date of Birth:   April 29, 1986 Identification Number:   MRN - [id number] Date of Service:   02/20/2024 20:49:13  Diagnosis:       I63.89 - Cerebrovascular accident (CVA) due to other mechanism [facility])       I63.311 - Cerebrovascular accident (CVA) due to thrombosis of right middle cerebral artery ([facility]CCC)  Impression:      38yo F with history of a left MCA stroke (residual expressive aphasia and mild right weakness), ischemic cardiomyopathy, presenting via EMS with left sided weakness, headache, and chest pain. NIHSS of 6. CT head negative. No thrombolytics due to recent stroke last week. CTP without core infarct, 42 cc mismatch right posterior MCA region. CTA notable for a right M2 occlusion. Case reviewed with NIR Dr. Mike who feels occlusion more distal in the M3 region. No emergent thrombectomy recommended as risk/benefit profile unfavorable given distal location of occlusion and lack of acute focal deficits compared to baseline. She will be admitted locally for aggressive medical management.  Discussed with NIR/Neurologist Text: mRS 1-2, NIHSS 6 (including baseline deficits) but no new focal deficits, LKW 1950, CTA: right M2 occlusion, CTP no core, 42cc mismatch right posterior MCA region.  Our recommendations are outlined below.  Recommendations:        Neuro Checks (Q1)       Bedside Swallow Eval       DVT Prophylaxis       IV Fluids, Normal Saline       Head of Bed 30 Degrees       Euglycemia and Avoid Hyperthermia (PRN Acetaminophen )       Bolus with Clopidogrel 300 mg bolus x1 and initiate dual antiplatelet therapy with Aspirin  81 mg daily and Clopidogrel 75 mg daily       Antihypertensives PRN if Blood pressure is greater than 220/120 or there is a concern for End organ damage/contraindications for permissive HTN. If blood pressure is greater than 220/120 give labetalol PO or IV or  Vasotec IV with a goal of 15% reduction in BP during the first 24 hours.       MRI brain without contrast.       TTE with bubble, lipids, HbA1c.       PT/OT/speech.  Sign Out:       Discussed with Emergency Department Provider    ------------------------------------------------------------------------------  Advanced Imaging: CTA Head and Neck Completed.  CTP Completed.  LVO:Yes  Discussed with NIR :Yes  Initial Call Time To NIR : 02/20/2024 21:47:04  NIR Accept/Decision Time : 02/20/2024 21:56:57  Discussed with NIR Time : 02/20/2024 21:50:45  Discussed with NIR Text : mRS 1-2, NIHSS 6 (including baseline deficits) but no new focal deficits, LKW 1950, CTA: right M2 occlusion, CTP no core, 42cc mismatch right posterior MCA region.  Neurointerventionalist Accepted Case : Patient is not a candidate for intervention.   Metrics: Last Known Well: 02/20/2024 19:50:00 Dispatch Time: 02/20/2024 20:49:13 Arrival Time: 02/20/2024 20:46:00 Initial Response Time: 02/20/2024 20:52:07 Symptoms: left weakness. Initial patient interaction: 02/20/2024 20:55:10 NIHSS Assessment Completed: 02/20/2024 21:02:08 Patient is not a candidate for Thrombolytic. Thrombolytic Medical Decision: 02/20/2024 21:02:09 Patient was not deemed candidate for Thrombolytic because of following reasons: Significant head trauma or stroke in previous 3 months .  CT Head: I personally reviewed all the CT images that were available to me and it showed: no acute findings. Subacute vs chronic infarct left frontal lobe.  Primary Provider  Notified of Diagnostic Impression and Management Plan on: 02/20/2024 22:04:05    ------------------------------------------------------------------------------  History of Present Illness: Patient is a 38 year old Female.  Patient was brought by EMS for symptoms of left weakness. 38yo F with history of a left MCA stroke (residual expressive aphasia and mild right  weakness), ischemic cardiomyopathy, presenting via EMS with left sided weakness, headache, and chest pain. LKW 1950 today per report. Patient states she was diagnosed with a stroke and a PE 4 days ago, but was discharged without any blood thinners (no available records for that visit). Per chart, hospitalized for stroke-like symptoms last month with MRI brain and CTA head/neck unrevealing. She denies recent falls/trauma.    Past Medical History:      Hypertension      Diabetes Mellitus      Hyperlipidemia      Stroke  Medications:  No Anticoagulant use  No Antiplatelet use Reviewed EMR for current medications  Allergies:  Reviewed  Social History: Smoking: Former Drug Use: Former  Family History:  There is no family history of premature cerebrovascular disease pertinent to this consultation  ROS : 14 Points Review of Systems was performed and was negative except mentioned in HPI.  Past Surgical History: There Is No Surgical History Contributory To Today's Visit     Examination: BP(141/113), Pulse(97), Blood Glucose(165) 1A: Level of Consciousness - Alert; keenly responsive + 0 1B: Ask Month and Age - Both Questions Right + 0 1C: Blink Eyes & Squeeze Hands - Performs Both Tasks + 0 2: Test Horizontal Extraocular Movements - Normal + 0 3: Test Visual Fields - No Visual Loss + 0 4: Test Facial Palsy (Use Grimace if Obtunded) - Minor paralysis (flat nasolabial fold, smile asymmetry) + 1 5A: Test Left Arm Motor Drift - Drift, but doesn't hit bed + 1 5B: Test Right Arm Motor Drift - Drift, but doesn't hit bed + 1 6A: Test Left Leg Motor Drift - Drift, but doesn't hit bed + 1 6B: Test Right Leg Motor Drift - Drift, but doesn't hit bed + 1 7: Test Limb Ataxia (FNF/Heel-Shin) - No Ataxia + 0 8: Test Sensation - Normal; No sensory loss + 0 9: Test Language/Aphasia - Mild-Moderate Aphasia: Some Obvious Changes, Without Significant Limitation + 1 10: Test Dysarthria - Normal +  0 11: Test Extinction/Inattention - No abnormality + 0  NIHSS Score: 6   Pre-Morbid Modified Rankin Scale: 2 Points = Slight disability; unable to carry out all previous activities, but able to look after own affairs without assistance  Spoke with : Dr. Neomi I reviewed the available imaging via Rapid and initiated discussion with the primary provider  This consult was conducted in real time using interactive audio and video technology. Patient was informed of the technology being used for this visit and agreed to proceed. Patient located in hospital and provider located at home/office setting.   Patient is being evaluated for possible acute neurologic impairment and high probability of imminent or life-threatening deterioration. I spent total of 50 minutes providing care to this patient, including time for face to face visit via telemedicine, review of medical records, imaging studies and discussion of findings with providers, the patient and/or family.    Dr Aureliano Mole   [facility] For Inpatient follow-up with [facility] physician please call [facility] at [phone]. As we are not an outpatient service for any post hospital discharge needs please contact the hospital for assistance. If you have any questions for the [facility]  physicians or need to reconsult for clinical or diagnostic changes please contact us  via [facility] at [phone].   Signature : Aureliano Mole

## 2024-02-20 NOTE — ED Triage Notes (Signed)
 Pt BIB [facility] for stroke like symptoms. Pt seen 4 days ago for stroke with left side deficits and PE with no treatment per pt. Pt dc two days ago. 1950 friend noted deficits L side weakness, slow and slurred speech. Headache, chest pain, and SOB. Pt is homeless and was in car picked up by EMS.

## 2024-02-21 ENCOUNTER — Inpatient Hospital Stay (HOSPITAL_COMMUNITY)
Admit: 2024-02-21 | Discharge: 2024-02-21 | Disposition: A | Discharge status: Home / Self Care | Payer: MEDICAID | Attending: Family Medicine | Admitting: Family Medicine

## 2024-02-21 ENCOUNTER — Inpatient Hospital Stay: Payer: MEDICAID

## 2024-02-21 ENCOUNTER — Other Ambulatory Visit: Payer: Self-pay

## 2024-02-21 ENCOUNTER — Encounter: Payer: Self-pay | Admitting: Family Medicine

## 2024-02-21 DIAGNOSIS — I6389 Other cerebral infarction: Secondary | ICD-10-CM | POA: Diagnosis not present

## 2024-02-21 DIAGNOSIS — I639 Cerebral infarction, unspecified: Secondary | ICD-10-CM | POA: Diagnosis not present

## 2024-02-21 LAB — URINE DRUG SCREEN, QUALITATIVE (ARMC ONLY)
Amphetamines, Ur Screen: NOT DETECTED
Barbiturates, Ur Screen: NOT DETECTED
Benzodiazepine, Ur Scrn: NOT DETECTED
Cannabinoid 50 Ng, Ur ~~LOC~~: NOT DETECTED
Cocaine Metabolite,Ur ~~LOC~~: NOT DETECTED
MDMA (Ecstasy)Ur Screen: NOT DETECTED
Methadone Scn, Ur: NOT DETECTED
Opiate, Ur Screen: NOT DETECTED
Phencyclidine (PCP) Ur S: NOT DETECTED
Tricyclic, Ur Screen: NOT DETECTED

## 2024-02-21 LAB — ECHOCARDIOGRAM COMPLETE BUBBLE STUDY
AR max vel: 2 cm2
AV Area VTI: 1.93 cm2
AV Area mean vel: 1.87 cm2
AV Mean grad: 1.3 mmHg
AV Peak grad: 2.9 mmHg
Ao pk vel: 0.86 m/s
Area-P 1/2: 5.23 cm2
Calc EF: 23.7 %
S' Lateral: 4.5 cm
Single Plane A2C EF: 26.8 %
Single Plane A4C EF: 22.2 %

## 2024-02-21 LAB — CBC
HCT: 36.8 % (ref 36.0–46.0)
Hemoglobin: 11.2 g/dL — ABNORMAL LOW (ref 12.0–15.0)
MCH: 28.4 pg (ref 26.0–34.0)
MCHC: 30.4 g/dL (ref 30.0–36.0)
MCV: 93.2 fL (ref 80.0–100.0)
Platelets: 262 K/uL (ref 150–400)
RBC: 3.95 MIL/uL (ref 3.87–5.11)
RDW: 14.6 % (ref 11.5–15.5)
WBC: 4.6 K/uL (ref 4.0–10.5)
nRBC: 0 % (ref 0.0–0.2)

## 2024-02-21 LAB — BASIC METABOLIC PANEL WITH GFR
Anion gap: 6 (ref 5–15)
BUN: 26 mg/dL — ABNORMAL HIGH (ref 6–20)
CO2: 26 mmol/L (ref 22–32)
Calcium: 7.7 mg/dL — ABNORMAL LOW (ref 8.9–10.3)
Chloride: 103 mmol/L (ref 98–111)
Creatinine, Ser: 1.45 mg/dL — ABNORMAL HIGH (ref 0.44–1.00)
GFR, Estimated: 47 mL/min — ABNORMAL LOW (ref >60)
Glucose, Bld: 195 mg/dL — ABNORMAL HIGH (ref 70–99)
Potassium: 4 mmol/L (ref 3.5–5.1)
Sodium: 135 mmol/L (ref 135–145)

## 2024-02-21 LAB — LIPID PANEL
Cholesterol: 210 mg/dL — ABNORMAL HIGH (ref 0–200)
HDL: 59 mg/dL (ref >40)
LDL Cholesterol: 130 mg/dL — ABNORMAL HIGH (ref 0–99)
Total CHOL/HDL Ratio: 3.6 ratio
Triglycerides: 107 mg/dL (ref <150)
VLDL: 21 mg/dL (ref 0–40)

## 2024-02-21 LAB — MRSA NEXT GEN BY PCR, NASAL: MRSA by PCR Next Gen: NOT DETECTED

## 2024-02-21 MED ORDER — CARVEDILOL 6.25 MG PO TABS
6.2500 mg | ORAL_TABLET | Freq: Two times a day (BID) | ORAL | Status: DC
  Administered 2024-02-21 – 2024-02-23 (×4): 6.25 mg via ORAL
  Filled 2024-02-21 (×4): qty 1

## 2024-02-21 MED ORDER — CLOPIDOGREL BISULFATE 75 MG PO TABS
75.0000 mg | ORAL_TABLET | Freq: Every day | ORAL | Status: DC
  Administered 2024-02-21 – 2024-02-23 (×3): 75 mg via ORAL
  Filled 2024-02-21 (×3): qty 1

## 2024-02-21 MED ORDER — ORAL CARE MOUTH RINSE: 15.0000 mL | OROMUCOSAL | Status: DC | PRN

## 2024-02-21 NOTE — Plan of Care (Signed)
  Problem: Education: Goal: Knowledge of disease or condition will improve Outcome: Progressing Goal: Knowledge of secondary prevention will improve (MUST DOCUMENT ALL) Outcome: Progressing Goal: Knowledge of patient specific risk factors will improve (DELETE if not current risk factor) Outcome: Progressing   Problem: Ischemic Stroke/TIA Tissue Perfusion: Goal: Complications of ischemic stroke/TIA will be minimized Outcome: Progressing   Problem: Coping: Goal: Will verbalize positive feelings about self Outcome: Progressing Goal: Will identify appropriate support needs Outcome: Progressing   Problem: Health Behavior/Discharge Planning: Goal: Ability to manage health-related needs will improve Outcome: Progressing   Problem: Education: Goal: Knowledge of General Education information will improve Description: Including pain rating scale, medication(s)/side effects and non-pharmacologic comfort measures Outcome: Progressing

## 2024-02-21 NOTE — Hospital Course (Addendum)
 Hospital course / significant events:   Molly Howard is a 38 y.o. female with medical history significant for systolic CHF, status post AICD, CVA, type 2 diabetes mellitus, stage IIIa chronic kidney disease and hypertension as well as COPD, tobacco abuse and polysubstance abuse, who presented to the ER with left-sided weakness - 4 days R weakness w/ worsening weakness and speech change.   10/20: to ED. Imaging concerning for new subacute / worsening old subacute or chronic infarct. CTA w/ MCA occlusion. Neuro consult, d/w neurointerventionalist, defer thrombectomy. Admit SDU, q2h neuro checks, reimage if worse. Admitted to hospitalist w/ neurology consult  10/21: Echo pending. PT/OT ok for home health but pt is unhoused. Neuro recs CT head repeat at 24h from previous. If no issues on echo / CT can likely DC in AM tomorrow      Consultants:  neurology  Procedures/Surgeries: none      ASSESSMENT & PLAN:   Acute CVA (cerebrovascular accident)  left-sided weakness and numbness with dysarthria and expressive dysphasia. neuro checks  aspirin  and Plavix.   2D echo pending Since head CT scan showed CVA and she has a pacemaker, did not order brain MRI. neurology to follow  physical/occupation/speech therapy consults  statin therapy and fasting lipids will be checked  Permissive HTN for now Neuro recs CT head repeat at 24h from previous - tonight. If no issues on echo / CT can likely DC in AM tomorrow    Dyslipidemia statin therapy.   Essential hypertension Permissive HTN    HFrEF (heart failure with reduced ejection fraction) (HCC) She does not have any current exacerbation. Will continue digoxin , Farxiga  and Coreg  for CHF and avoid rebound tachycardia Holding Aldactone  and Entresto  and allowing permissive HTN   Class 2 / Class 3 obesity based on BMI: Body mass index is 38.67 kg/m.SABRA Significantly low or high BMI is associated with higher medical risk.  Underweight - under 18   overweight - 25 to 29 obese - 30 or more Class 1 obesity: BMI of 30.0 to 34 Class 2 obesity: BMI of 35.0 to 39 Class 3 obesity: BMI of 40.0 to 49 Super Morbid Obesity: BMI 50-59 Super-super Morbid Obesity: BMI 60+ Healthy nutrition and physical activity advised as adjunct to other disease management and risk reduction treatments    DVT prophylaxis: lovenox  IV fluids: no continuous IV fluids  Nutrition: cardiac Central lines / other devices: none  Code Status: full code ACP documentation reviewed:  none on file in [facility]  TOC needs: possible HH if pt can stay w/ family  Medical barriers to dispo: CT, Echo. Expected medical readiness for discharge tomorrow.

## 2024-02-21 NOTE — Progress Notes (Signed)
 PROGRESS NOTE    Molly Howard   FMW:[id number] DOB: 11-May-1985  DOA: 02/20/2024 Date of Service: 02/21/24 which is hospital day 1  PCP: [name], [facility] course / significant events:   Molly Howard is a 38 y.o. female with medical history significant for systolic CHF, status post AICD, CVA, type 2 diabetes mellitus, stage IIIa chronic kidney disease and hypertension as well as COPD, tobacco abuse and polysubstance abuse, who presented to the ER with left-sided weakness - 4 days R weakness w/ worsening weakness and speech change.   10/20: to ED. Imaging concerning for new subacute / worsening old subacute or chronic infarct. CTA w/ MCA occlusion. Neuro consult, d/w neurointerventionalist, defer thrombectomy. Admit SDU, q2h neuro checks, reimage if worse. Admitted to hospitalist w/ neurology consult  10/21: Echo pending. PT/OT ok for home health but pt is unhoused. Neuro recs CT head repeat at 24h from previous. If no issues on echo / CT can likely DC in AM tomorrow      Consultants:  neurology  Procedures/Surgeries: none      ASSESSMENT & PLAN:   Acute CVA (cerebrovascular accident)  left-sided weakness and numbness with dysarthria and expressive dysphasia. neuro checks  aspirin  and Plavix.   2D echo pending Since head CT scan showed CVA and she has a pacemaker, did not order brain MRI. neurology to follow  physical/occupation/speech therapy consults  statin therapy and fasting lipids will be checked  Permissive HTN for now Neuro recs CT head repeat at 24h from previous - tonight. If no issues on echo / CT can likely DC in AM tomorrow    Dyslipidemia statin therapy.   Essential hypertension Permissive HTN    HFrEF (heart failure with reduced ejection fraction) (HCC) She does not have any current exacerbation. Will continue digoxin , Farxiga  and Coreg  for CHF and avoid rebound tachycardia Holding Aldactone  and Entresto  and allowing permissive  HTN   Class 2 / Class 3 obesity based on BMI: Body mass index is 38.67 kg/m.SABRA Significantly low or high BMI is associated with higher medical risk.  Underweight - under 18  overweight - 25 to 29 obese - 30 or more Class 1 obesity: BMI of 30.0 to 34 Class 2 obesity: BMI of 35.0 to 39 Class 3 obesity: BMI of 40.0 to 49 Super Morbid Obesity: BMI 50-59 Super-super Morbid Obesity: BMI 60+ Healthy nutrition and physical activity advised as adjunct to other disease management and risk reduction treatments    DVT prophylaxis: lovenox  IV fluids: no continuous IV fluids  Nutrition: cardiac Central lines / other devices: none  Code Status: full code ACP documentation reviewed:  none on file in [facility]  TOC needs: possible HH if pt can stay w/ family  Medical barriers to dispo: CT, Echo. Expected medical readiness for discharge tomorrow.              Subjective / Brief ROS:  Patient reports no concenrs today Denies CP/SOB.  Pain controlled.  Denies new weakness.  Tolerating diet.  Reports no concerns w/ urination/defecation.   Family Communication: none at this time     Objective Findings:  Vitals:   02/21/24 1200 02/21/24 1300 02/21/24 1600 02/21/24 1630  BP: 123/87 121/67 124/89 124/89  Pulse: 76 81 82 82  Resp: (!) 22 (!) 24    Temp:   98.3 F (36.8 C)   TempSrc:   Oral   SpO2: 99% 98% 100%   Weight:  Height:        Intake/Output Summary (Last 24 hours) at 02/21/2024 1852 Last data filed at 02/21/2024 1558 Gross per 24 hour  Intake 719.52 ml  Output 1850 ml  Net -1130.48 ml   Filed Weights   02/20/24 2117  Weight: 112 kg    Examination:  Physical Exam Constitutional:      General: She is not in acute distress. Cardiovascular:     Rate and Rhythm: Normal rate and regular rhythm.  Pulmonary:     Effort: Pulmonary effort is normal.     Breath sounds: Normal breath sounds.  Abdominal:     Palpations: Abdomen is soft.  Musculoskeletal:      Right lower leg: No edema.     Left lower leg: No edema.  Skin:    General: Skin is warm and dry.  Neurological:     Mental Status: She is alert and oriented to person, place, and time.     Comments: See neuro note for full exam   Psychiatric:        Mood and Affect: Mood normal.        Behavior: Behavior normal.          Scheduled Medications:   atorvastatin   20 mg Oral Daily   carvedilol   6.25 mg Oral BID WC   Chlorhexidine  Gluconate Cloth  6 each Topical QHS   clopidogrel  75 mg Oral Daily   dapagliflozin  propanediol  10 mg Oral Daily   digoxin   0.0625 mg Oral Daily   enoxaparin  (LOVENOX ) injection  55 mg Subcutaneous Q24H   ferrous sulfate   325 mg Oral BID    Continuous Infusions:   PRN Medications:  acetaminophen  **OR** acetaminophen , magnesium  hydroxide, ondansetron  **OR** ondansetron  (ZOFRAN ) IV, traZODone   Antimicrobials from admission:  Anti-infectives (From admission, onward)    None           Data Reviewed:  I have personally reviewed the following...  CBC: Recent Labs  Lab 02/20/24 2058 02/21/24 0438  WBC 5.5 4.6  HGB 11.8* 11.2*  HCT 39.5 36.8  MCV 95.0 93.2  PLT 310 262   Basic Metabolic Panel: Recent Labs  Lab 02/20/24 2058 02/21/24 0438  NA 137 135  K 4.4 4.0  CL 101 103  CO2 22 26  GLUCOSE 169* 195*  BUN 29* 26*  CREATININE 1.60* 1.45*  CALCIUM  8.1* 7.7*   GFR: Estimated Creatinine Clearance: 67.3 mL/min (A) (by C-G formula based on SCr of 1.45 mg/dL (H)). Liver Function Tests: Recent Labs  Lab 02/20/24 2058  AST 23  ALT 16  ALKPHOS 77  BILITOT 0.5  PROT 6.3*  ALBUMIN  2.2*   No results for input(s): LIPASE, AMYLASE in the last 168 hours. No results for input(s): AMMONIA in the last 168 hours. Coagulation Profile: Recent Labs  Lab 02/20/24 2058  INR 1.0   Cardiac Enzymes: No results for input(s): CKTOTAL, CKMB, CKMBINDEX, TROPONINI in the last 168 hours. BNP (last 3 results) No results for  input(s): PROBNP in the last 8760 hours. HbA1C: No results for input(s): HGBA1C in the last 72 hours. CBG: Recent Labs  Lab 02/20/24 2049  GLUCAP 165*   Lipid Profile: Recent Labs    02/21/24 0438  CHOL 210*  HDL 59  LDLCALC 130*  TRIG 107  CHOLHDL 3.6   Thyroid  Function Tests: No results for input(s): TSH, T4TOTAL, FREET4, T3FREE, THYROIDAB in the last 72 hours. Anemia Panel: No results for input(s): VITAMINB12, FOLATE, FERRITIN, TIBC, IRON , RETICCTPCT in  the last 72 hours. Most Recent Urinalysis On File:     Component Value Date/Time   COLORURINE YELLOW (A) 08/28/2023 1041   APPEARANCEUR HAZY (A) 08/28/2023 1041   LABSPEC 1.012 08/28/2023 1041   PHURINE 5.0 08/28/2023 1041   GLUCOSEU NEGATIVE 08/28/2023 1041   HGBUR MODERATE (A) 08/28/2023 1041   BILIRUBINUR NEGATIVE 08/28/2023 1041   KETONESUR NEGATIVE 08/28/2023 1041   PROTEINUR >=300 (A) 08/28/2023 1041   NITRITE NEGATIVE 08/28/2023 1041   LEUKOCYTESUR TRACE (A) 08/28/2023 1041   Sepsis Labs: @LABRCNTIP (procalcitonin:4,lacticidven:4) Microbiology: Recent Results (from the past 240 hours)  MRSA Next Gen by PCR, Nasal     Status: None   Collection Time: 02/20/24 11:53 PM   Specimen: Nasal Mucosa; Nasal Swab  Result Value Ref Range Status   MRSA by PCR Next Gen NOT DETECTED NOT DETECTED Final    Comment: (NOTE) The GeneXpert MRSA Assay (FDA approved for NASAL specimens only), is one component of a comprehensive MRSA colonization surveillance program. It is not intended to diagnose MRSA infection nor to guide or monitor treatment for MRSA infections. Test performance is not FDA approved in patients less than 64 years old. Performed at [facility], [address] [place], [place] [address]       Radiology Studies last 3 days: CT ANGIO HEAD NECK W WO CM W PERF (CODE STROKE) Result Date: 02/20/2024 EXAM: CTA Head and Neck with Perfusion 02/20/2024 09:11:53 PM TECHNIQUE:  CTA of the head and neck was performed with and without the administration of 100 mL of iohexol  (OMNIPAQUE ) 350 MG/ML injection. 3D postprocessing with multiplanar reconstructions and MIPs was performed to evaluate the vascular anatomy. Cerebral perfusion analysis using computed tomography with contrast administration, including post-processing of parametric maps with determination of cerebral blood flow, cerebral blood volume, mean transit time and time-to-maximum. Automated exposure control, iterative reconstruction, and/or weight based adjustment of the mA/kV was utilized to reduce the radiation dose to as low as reasonably achievable. COMPARISON: 01/04/2024 CLINICAL HISTORY: Acute neuro deficit, stroke suspected, with left weakness and headache. Patient presented with stroke-like symptoms, including left-sided deficits, slow and slurred speech, headache, chest pain, and shortness of breath. History of stroke 4 days prior with left-sided deficits and pulmonary embolism (PE) without treatment, discharged 2 days ago. FINDINGS: CTA NECK: AORTIC ARCH AND ARCH VESSELS: No dissection or arterial injury. No significant stenosis of the brachiocephalic or subclavian arteries. CERVICAL CAROTID ARTERIES: Calcific atherosclerosis at both carotid bifurcations without hemodynamically significant stenosis by NASCET criteria. No dissection or arterial injury. CERVICAL VERTEBRAL ARTERIES: No dissection, arterial injury, or significant stenosis. LUNGS AND MEDIASTINUM: Unremarkable. SOFT TISSUES: Soft tissue asymmetry within the right nasopharynx recommend correlation with direct visualization. BONES: No acute abnormality. CTA HEAD: ANTERIOR CIRCULATION: No significant stenosis of the internal carotid arteries. No significant stenosis of the anterior cerebral arteries. Occlusion of a right MCA M2 branch within the right insula. Normal left MCA. No aneurysm. POSTERIOR CIRCULATION: No significant stenosis of the posterior cerebral  arteries. No significant stenosis of the basilar artery. No significant stenosis of the vertebral arteries. No aneurysm. OTHER: No dural venous sinus thrombosis on this non-dedicated study. CT PERFUSION: EXAM QUALITY: Exam quality is adequate with diagnostic perfusion maps. No significant motion artifact. Appropriate arterial inflow and venous outflow curves. CORE INFARCT (CBF<30% volume): 0 mL TOTAL HYPOPERFUSION (Tmax>6s volume): 42 mL PENUMBRA: Mismatch volume: 42 mL Mismatch ratio: Not applicable Location: Posterior left MCA territory IMPRESSION: 1. Right MCA M2 branch occlusion within the right insula. 2. CT  perfusion demonstrates a 42 mL region of hypoperfusion without core infarct. 3. No perfusion abnormality at the site of hypodensity in the left frontal lobe, which is more likely chronic. 4. Calcific atherosclerosis at both carotid bifurcations without hemodynamically significant stenosis by NASCET criteria. 5. Right nasopharyngeal soft tissue asymmetry, recommend direct visualization. Findings communicated at 9:32 PM on 02/20/2024. Electronically signed by: Franky Stanford MD 02/20/2024 09:33 PM EDT RP Workstation: [id number]   CT HEAD CODE STROKE WO CONTRAST (LKW 0-4.5h, LVO 0-24h) Result Date: 02/20/2024 EXAM: CT HEAD WITHOUT CONTRAST 02/20/2024 08:54:32 PM TECHNIQUE: CT of the head was performed without the administration of intravenous contrast. Automated exposure control, iterative reconstruction, and/or weight based adjustment of the mA/kV was utilized to reduce the radiation dose to as low as reasonably achievable. COMPARISON: 01/04/2024 CLINICAL HISTORY: Neuro deficit, acute, stroke suspected. FINDINGS: BRAIN AND VENTRICLES: No acute hemorrhage. Subacute left frontal lobe infarct, with increased size of hypodense region compared to 01/04/24, which could indicate an early subacute infarct superimposed on the late subacute/early chronic infarct in this region. Chronic left cerebellar infarct is  unchanged. No hydrocephalus. No extra-axial collection. No mass effect or midline shift. ASPECTS is 9. ORBITS: No acute abnormality. SINUSES: No acute abnormality. SOFT TISSUES AND SKULL: No acute soft tissue abnormality. No skull fracture. IMPRESSION: 1. Subacute left frontal lobe infarct, with increased size of the hypodense region compared to 01/04/24, possibly reflecting an early subacute component superimposed on a late subacute to early chronic infarct in this region. MRI recommended. 2. No acute intracranial hemorrhage. 3. Chronic left cerebellar infarct, unchanged. 4. ASPECTS is 9. 5. Findings communicated to Dr. Suzanne at 9:05 pm on 02/20/2024. Electronically signed by: Franky Stanford MD 02/20/2024 09:06 PM EDT RP Workstation: [id number]        Laneta Blunt, DO [facility] 02/21/2024, 6:52 PM    Dictation software may have been used to generate the above note. Typos may occur and escape review in typed/dictated notes. Please contact Dr Blunt directly for clarity if needed.  Staff may message me via secure chat in Epic  but this may not receive an immediate response,  please page me for urgent matters!  If 7PM-7AM, please contact night coverage [email or web address]

## 2024-02-21 NOTE — Plan of Care (Signed)
 The patient maintains NIH of 3 for duration of the day. Patient ambulated with PT, patient remains in recliner at patient's request. Patient's activity continues to progress. Patient able to ambulate with standby assist to bedside commode. Patient's call be within reach.   Problem: Ischemic Stroke/TIA Tissue Perfusion: Goal: Complications of ischemic stroke/TIA will be minimized Outcome: Progressing   Problem: Self-Care: Goal: Ability to participate in self-care as condition permits will improve Outcome: Progressing   Problem: Nutrition: Goal: Dietary intake will improve Outcome: Progressing   Problem: Activity: Goal: Risk for activity intolerance will decrease Outcome: Progressing

## 2024-02-21 NOTE — Progress Notes (Signed)
 SLP Cancellation Note  Patient Details Name: Molly Howard MRN: [id number] DOB: 22-May-1985   Cancelled treatment:       Reason Eval/Treat Not Completed: Patient declined, no reason specified;Fatigue/lethargy limiting ability to participate  Pt sleeping and requested to continue napping. This Clinical research associate will re-attempt at next available time.   Sherion Dooly B. Rubbie, M.S., CCC-SLP, CBIS Speech-Language Pathologist Certified Brain Injury Specialist [facility]  [facility] [phone] Ascom [phone] Fax [phone]    [name] 02/21/2024, 4:16 PM

## 2024-02-21 NOTE — Assessment & Plan Note (Signed)
-   Will continue antihypertensive therapy.

## 2024-02-21 NOTE — Evaluation (Signed)
 Occupational Therapy Evaluation Patient Details Name: Molly Howard MRN: [id number] DOB: 1985-06-15 Today's Date: 02/21/2024   History of Present Illness   Pt is a 38 y.o.female who presented to the ER with acute onset of left-sided weakness that started around 6-7 PM with associated paresthesias and dysarthria and expressive dysphasia. MD assessment acute CVA on CT subacute left frontal lobe infarct, unable to do MRI d/t pacemaker. Also has R MCA occlusion not a candidate for neuro-intervention or TNK. PMH of systolic CHF, status post AICD, CVA, type 2 diabetes mellitus, CKD3 and hypertension as well as COPD, tobacco abuse and polysubstance abuse.     Clinical Impressions Pt was seen for OT evaluation this date. PTA, pt reports she lives with her sister in a car and is IND with all tasks at baseline without AD use. Pt presents with deficits in L sided strength, balance, and activity tolerance, affecting safe and optimal ADL completion. Pt's speech was not slurred during session, answers all questions appropriately. Pt currently requires CGA for all tasks including bed mobility, STS, SPT to [facility] and ambulation in the room and out into the hallway. Pt ambulated with CGA and no AD, as well as with HHA x1 with 2nd assist for lines/leads management. Pt reports ambulating slower than usual. Clothing management/standing peri-care performed with CGA, no LOB. Pt utilized BUEs during functional tasks, although noted with mild weakness in LUE 3+/5. Vision and sensation intact. Pt would benefit from skilled OT services to address noted impairments and functional limitations to maximize safety and independence while minimizing future risk of falls, injury, and readmission. Do anticipate the need for follow up OT services upon acute hospital DC.      If plan is discharge home, recommend the following:   A little help with walking and/or transfers;A little help with bathing/dressing/bathroom;Assistance with  cooking/housework;Help with stairs or ramp for entrance     Functional Status Assessment   Patient has had a recent decline in their functional status and demonstrates the ability to make significant improvements in function in a reasonable and predictable amount of time.     Equipment Recommendations   None recommended by OT     Recommendations for Other Services         Precautions/Restrictions   Precautions Precautions: Fall Recall of Precautions/Restrictions: Intact Restrictions Weight Bearing Restrictions Per Provider Order: No     Mobility Bed Mobility Overal bed mobility: Needs Assistance Bed Mobility: Supine to Sit     Supine to sit: Contact guard, HOB elevated, Used rails          Transfers Overall transfer level: Needs assistance Equipment used: None, 1 person hand held assist Transfers: Sit to/from Stand, Bed to chair/wheelchair/BSC Sit to Stand: Contact guard assist     Step pivot transfers: Contact guard assist     General transfer comment: able to stand and perform step pivot to [facility] with CGA, ambulated to sink to wash hands without HHA requiring CGA, then ambulated additional bout into hallway ~75 ft with HHA x1/CGA and 2nd assist for lines/leads management      Balance Overall balance assessment: Needs assistance Sitting-balance support: Feet supported Sitting balance-Leahy Scale: Good     Standing balance support: During functional activity, Single extremity supported Standing balance-Leahy Scale: Good Standing balance comment: CGA, no LOB, slower pace                           ADL either performed or  assessed with clinical judgement   ADL Overall ADL's : Needs assistance/impaired     Grooming: Wash/dry hands;Standing;Contact guard assist                   Toilet Transfer: BSC/3in1;Stand-pivot;Contact guard assist   Toileting- Clothing Manipulation and Hygiene: Contact guard assist;Sit to/from  stand Toileting - Clothing Manipulation Details (indicate cue type and reason): standing peri-care after BM     Functional mobility during ADLs: Contact guard assist       Vision Baseline Vision/History: 1 Wears glasses Patient Visual Report: No change from baseline       Perception         Praxis         Pertinent Vitals/Pain Pain Assessment Pain Assessment: Faces Faces Pain Scale: Hurts a little bit Pain Location: LUE where IV is Pain Intervention(s): Monitored during session, Repositioned     Extremity/Trunk Assessment Upper Extremity Assessment Upper Extremity Assessment: Generalized weakness;Right hand dominant;LUE deficits/detail LUE Deficits / Details: 3+/5 strength; sensation and coordination intact   Lower Extremity Assessment Lower Extremity Assessment: Defer to PT evaluation;Generalized weakness;LLE deficits/detail LLE Deficits / Details: weakness       Communication Communication Communication: Impaired Factors Affecting Communication: Difficulty expressing self;Other (comment)   Cognition Arousal: Alert Behavior During Therapy: WFL for tasks assessed/performed               OT - Cognition Comments: A and O x4                 Following commands: Intact       Cueing  General Comments   Cueing Techniques: Verbal cues  VSS throughout session   Exercises Other Exercises Other Exercises: Edu on role of OT in acute setting.   Shoulder Instructions      Home Living Family/patient expects to be discharged to:: Shelter/Homeless (reports she lives at her sister's house in the car?)                                 Additional Comments: pt reports she lives with her sister in sister's car, unsure if there is a camper/RV or house, but pt says she takes care of sister's dog when sister is at work      Prior Functioning/Environment Prior Level of Function : Independent/Modified Independent             Mobility  Comments: IND without AD use ADLs Comments: IND    OT Problem List: Decreased strength;Decreased activity tolerance;Impaired balance (sitting and/or standing)   OT Treatment/Interventions: Self-care/ADL training;Therapeutic exercise;Patient/family education;Balance training;Energy conservation;DME and/or AE instruction;Therapeutic activities      OT Goals(Current goals can be found in the care plan section)   Acute Rehab OT Goals Patient Stated Goal: get stronger OT Goal Formulation: With patient Time For Goal Achievement: 03/06/24 Potential to Achieve Goals: Good ADL Goals Pt Will Perform Lower Body Bathing: with modified independence;sitting/lateral leans;sit to/from stand;with supervision Pt Will Perform Lower Body Dressing: with modified independence;with supervision;sit to/from stand;sitting/lateral leans Pt Will Transfer to Toilet: with modified independence;with supervision;ambulating   OT Frequency:  Min 2X/week    Co-evaluation              AM-PAC OT 6 Clicks Daily Activity     Outcome Measure Help from another person eating meals?: None Help from another person taking care of personal grooming?: A Little Help from another person toileting, which includes  using toliet, bedpan, or urinal?: A Little Help from another person bathing (including washing, rinsing, drying)?: A Little Help from another person to put on and taking off regular upper body clothing?: A Little Help from another person to put on and taking off regular lower body clothing?: A Little 6 Click Score: 19   End of Session Equipment Utilized During Treatment: Gait belt Nurse Communication: Mobility status  Activity Tolerance: Patient tolerated treatment well Patient left: in chair;with call bell/phone within reach;with family/visitor present  OT Visit Diagnosis: Other abnormalities of gait and mobility (R26.89);Muscle weakness (generalized) (M62.81)                Time: 8960-8891 OT Time  Calculation (min): 29 min Charges:  OT General Charges $OT Visit: 1 Visit OT Evaluation $OT Eval Moderate Complexity: 1 Mod Barack Nicodemus, OTR/L 02/21/24, 12:36 PM  Duwaine FORBES Saupe 02/21/2024, 12:33 PM

## 2024-02-21 NOTE — Evaluation (Signed)
 Physical Therapy Evaluation Patient Details Name: Molly Howard MRN: [id number] DOB: 04-24-1986 Today's Date: 02/21/2024  History of Present Illness  Patient is a 38 year old female with acute onset left side weakness, paresthesias, dysarthria, expressive deficits. CT of head with subacute left frontal lobe infarct. PMH:  CHF, status post AICD, CVA, type 2 diabetes mellitus, stage IIIa chronic kidney disease and hypertension as well as COPD, tobacco abuse and polysubstance abuse.  Clinical Impression  Patient is agreeable to PT session. She reports she lives in a car. She is independent at baseline. Her dad was at the bedside today.  The patient has mild left leg weakness on exam. She ambulated in the hallway with hand held assistance with mild decreased step length with left leg. CGA provided for all mobility for safety. Vitals stable throughout session. Recommend PT follow up to maximize independence and decrease caregiver burden.       If plan is discharge home, recommend the following: Assist for transportation   Can travel by private vehicle        Equipment Recommendations None recommended by PT  Recommendations for Other Services       Functional Status Assessment Patient has had a recent decline in their functional status and demonstrates the ability to make significant improvements in function in a reasonable and predictable amount of time.     Precautions / Restrictions Precautions Precautions: Fall Recall of Precautions/Restrictions: Intact Restrictions Weight Bearing Restrictions Per Provider Order: No      Mobility  Bed Mobility Overal bed mobility: Needs Assistance Bed Mobility: Supine to Sit     Supine to sit: Contact guard, HOB elevated          Transfers Overall transfer level: Needs assistance Equipment used: 1 person hand held assist Transfers: Sit to/from Stand, Bed to chair/wheelchair/BSC Sit to Stand: Contact guard assist   Step pivot  transfers: Contact guard assist            Ambulation/Gait Ambulation/Gait assistance: Contact guard assist Gait Distance (Feet): 100 Feet Assistive device: None Gait Pattern/deviations: Step-through pattern, Decreased step length - left Gait velocity: decreased     General Gait Details: no loss of balance with hallway ambulation. mild decreased step length on LLE. no dizziness reported with activity  Stairs            Wheelchair Mobility     Tilt Bed    Modified Rankin (Stroke Patients Only)       Balance Overall balance assessment: Needs assistance Sitting-balance support: Feet supported Sitting balance-Leahy Scale: Good     Standing balance support: During functional activity, Single extremity supported, No upper extremity supported Standing balance-Leahy Scale: Fair                               Pertinent Vitals/Pain Pain Assessment Pain Assessment: Faces Faces Pain Scale: Hurts a little bit Pain Location: LUE at IV site Pain Descriptors / Indicators: Discomfort Pain Intervention(s): Monitored during session    Home Living Family/patient expects to be discharged to:: Shelter/Homeless                   Additional Comments: she reports she lives in a car at her sister's home    Prior Function Prior Level of Function : Independent/Modified Independent             Mobility Comments: independent ADLs Comments: independent     Extremity/Trunk Assessment  Upper Extremity Assessment Upper Extremity Assessment: Right hand dominant;Defer to OT evaluation LUE Deficits / Details: 3+/5 strength; sensation and coordination intact    Lower Extremity Assessment Lower Extremity Assessment: RLE deficits/detail;LLE deficits/detail RLE Deficits / Details: 5/5 knee extension, dorsiflexion/plantarflexion RLE Sensation: WNL LLE Deficits / Details: 4/5 knee extension, 5/5 dorsiflexion/plantarflexion LLE Sensation: WNL        Communication   Communication Communication: Impaired Factors Affecting Communication: Difficulty expressing self    Cognition Arousal: Alert Behavior During Therapy: WFL for tasks assessed/performed   PT - Cognitive impairments: Difficult to assess Difficult to assess due to: Impaired communication                     PT - Cognition Comments: slow to respond at times. grossly oriented and able to follow single step commands consistently with increased time Following commands: Intact       Cueing Cueing Techniques: Verbal cues     General Comments General comments (skin integrity, edema, etc.): vitals stable throughout session. dad at the bedside    Exercises     Assessment/Plan    PT Assessment Patient needs continued PT services  PT Problem List Decreased strength;Decreased activity tolerance;Decreased balance;Decreased mobility;Decreased safety awareness       PT Treatment Interventions DME instruction;Gait training;Stair training;Functional mobility training;Therapeutic activities;Therapeutic exercise;Balance training;Neuromuscular re-education;Cognitive remediation;Patient/family education    PT Goals (Current goals can be found in the Care Plan section)  Acute Rehab PT Goals Patient Stated Goal: to get better PT Goal Formulation: With patient Time For Goal Achievement: 03/06/24 Potential to Achieve Goals: Fair    Frequency Min 2X/week     Co-evaluation PT/OT/SLP Co-Evaluation/Treatment: Yes Reason for Co-Treatment: Complexity of the patient's impairments (multi-system involvement) PT goals addressed during session: Mobility/safety with mobility         AM-PAC PT 6 Clicks Mobility  Outcome Measure Help needed turning from your back to your side while in a flat bed without using bedrails?: None Help needed moving from lying on your back to sitting on the side of a flat bed without using bedrails?: A Little Help needed moving to and from a bed  to a chair (including a wheelchair)?: A Little Help needed standing up from a chair using your arms (e.g., wheelchair or bedside chair)?: A Little Help needed to walk in hospital room?: A Little Help needed climbing 3-5 steps with a railing? : A Little 6 Click Score: 19    End of Session Equipment Utilized During Treatment: Gait belt Activity Tolerance: Patient tolerated treatment well Patient left: in chair;with call bell/phone within reach;with family/visitor present Nurse Communication: Mobility status PT Visit Diagnosis: Difficulty in walking, not elsewhere classified (R26.2);Other abnormalities of gait and mobility (R26.89)    Time: 8962-8889 PT Time Calculation (min) (ACUTE ONLY): 33 min   Charges:   PT Evaluation $PT Eval Moderate Complexity: 1 Mod PT Treatments $Therapeutic Activity: 8-22 mins PT General Charges $$ ACUTE PT VISIT: 1 Visit         Molly Howard, PT, MPT   Molly LULLA Howard 02/21/2024, 1:12 PM

## 2024-02-21 NOTE — Assessment & Plan Note (Signed)
 Continue statin therapy

## 2024-02-21 NOTE — Progress Notes (Signed)
*  PRELIMINARY RESULTS* Echocardiogram 2D Echocardiogram has been performed.  Floydene Harder 02/21/2024, 1:20 PM

## 2024-02-21 NOTE — Assessment & Plan Note (Addendum)
-   Will continue digoxin , Farxiga  and Coreg  as well as Aldactone  and Entresto .  She does not have any current exacerbation.

## 2024-02-21 NOTE — Assessment & Plan Note (Addendum)
-   This is manifested by left-sided hemiparalysis and numbness with dysarthria and expressive dysphasia. - The patient will be admitted to an observation medically monitored bed.   - We will follow neuro checks q.4 hours for 24 hours.   - The patient will be placed on aspirin  and Plavix.   - Will obtain a 2D echo with bubble study .  Since her head CT scan showed her CVA and she has a pacemaker, we did not order brain MRI. - A neurology consultation  as well as physical/occupation/speech therapy consults will be obtained in a.m..  I notified Dr. Matthews about the patient. - The patient will be placed on statin therapy and fasting lipids will be checked.

## 2024-02-22 DIAGNOSIS — I639 Cerebral infarction, unspecified: Secondary | ICD-10-CM | POA: Diagnosis not present

## 2024-02-22 LAB — GLUCOSE, CAPILLARY: Glucose-Capillary: 152 mg/dL — ABNORMAL HIGH (ref 70–99)

## 2024-02-22 MED ORDER — ASPIRIN 81 MG PO TBEC
81.0000 mg | DELAYED_RELEASE_TABLET | Freq: Every day | ORAL | Status: DC
  Administered 2024-02-22 – 2024-02-23 (×2): 81 mg via ORAL
  Filled 2024-02-22 (×2): qty 1

## 2024-02-22 MED ORDER — ATORVASTATIN CALCIUM 20 MG PO TABS
40.0000 mg | ORAL_TABLET | Freq: Every day | ORAL | Status: DC
  Administered 2024-02-22 – 2024-02-23 (×2): 40 mg via ORAL
  Filled 2024-02-22 (×2): qty 2

## 2024-02-22 NOTE — Discharge Instructions (Signed)
 Your nurse navigator, Molly Howard, can be reached at [phone]  ?? Programs & Services  [place] [employer] [phone])  Phone: [phone]  acta-[email or web address] +2 acta-[email or web address] +2  Details: Offers door-to-door service for general trips, medical trips, work, Catering manager. Also has a "Dial-a-Ride" program for those 39+ years old.  acta-[place].com +2 acta-[place].com +2  Tip: Book your ride the day before by 11?a.m. (or earlier for Monday rides) for best availability.  acta-[place].com  Link Transit (Fixed-Route & Paratransit)  Bus / general info: Phone [phone]    Paratransit service/door-to-door for those with disabilities: Phone [phone]   Details: [employer], [place], [place], [place] and more in [place]. Includes fixed routes and ADA accessible vehicles.    Non-Emergency Medical Transportation (NEMT) via [facility] Department of Social Services  Phone: [phone] (leave name & phone)  [email or web address]://www.hines.net/  Who it's for: Medicaid eligible individuals needing transport to covered medical services.  [email or web address]://www.hines.net/  Other contacts: If you're with a managed care plan, you may need to contact the [facility] broker listed (see site) for assistance.  [email or web address]://www.hines.net/  Senior/Older Adult Research officer, trade union Line  For older adults (60+) via the state program run by [place]  Division of Aging and Adult Services: You may qualify for general or medical-transport help simply because you're 60+.   DHHS  Also: For help finding services (including transportation) in [place], dial 2-1-1 (or [phone]) via [place] [address] of [place].  [employer] of [place]  Additional private / care-based transportation service  Home Instead?[place]: In-Home Care w/ Transportation   Phone: [phone]  BenefitsCheckUp  For older adults with disabilities: This service offers in-home care plus  transportation to appointments or errands. Good option if more personalized service is needed.   ------------------------------------------------------------------------------------------------------- Here is a comprehensive list of homeless shelters and housing resources in [place], [place], along with their contact information and brief descriptions:  ?? [facility])  Address: [address] [place], [place], [place] [address]  Phone: [phone]  Hours: 24/7 shelter; Coordinated Assessment (Crisis Housing) M-F, 8am - 4pm  Website: alliedchurches.org  Description: Provides emergency shelter, meals, and housing stabilization services. Intake requires a referral through the Coordinated Assessment System, except during inclement weather.  ?? [employer] [facility]  Address: [address] [place], [place], [place] [address]  Phone: [phone]  Website: AnonymousMortgage.hu  Description: Offers emergency shelter, case management, and support services. Primarily serves individuals and families experiencing homelessness.  ??? [facility]  Address: [address], [place], [place] [address]  Phone: [phone] (Crisis Line)  Website: https://long-stone.com/  Description: Provides emergency shelter and supportive services for victims of domestic violence.  ??? [facility] [facility]  Phone: [phone]  Website: freedomshope.org  Description: A faith-based organization offering emergency shelter and transitional housing for women and children.  ??? [employer] of [facility]  Phone: [phone]  Website: alamancehabitat.org  Description: Provides affordable housing solutions and homeownership opportunities for low-income families.  ??? [employer]  Phone: [phone]  Website: bur[place]housingauthority.org  Description: Manages public housing and Section 8  rental assistance programs in [place].  ??? [employer]: [phone]  Website: grahamhousingauthority.com  Description: Provides affordable housing options and rental assistance programs in [place].  ??? [facility]  Address: [address] [place], [place], [place] [address]  Phone: [phone]  Description: Operates as a warming center during extreme weather conditions.  ?? [employer] of  [place] (2-1-1)  Phone: Dial 2-1-1 or [phone]  Website: uwalamance.org  Description: Provides a Scientist, research (physical sciences) for housing, shelter, and other social services in [place].  --------------------------------------------------------------------------------------------------  Here are some food assistance resources available in [place], [place]:  [facility] [employer] of [place], [place] Offers emergency food assistance through [employer]. Open Mondays, Wednesdays, and Fridays from 1:00 PM to 3:30 PM.   [employer]  SAFE [place], [place] Provides two choice-style food pantries: 'Safe at [address]' and 'Safe at [employer] Clients can shop for groceries based on family size.   SAFE [place] Fresh [employer] [place], [place] Operated by [employer], this pantry distributes food on select Thursdays from 9:00 AM to 11:00 AM or until supplies last.   [employer].https://www.hines.net/ [employer] [facility]) Food Support [place], [place] Offers on-campus food support including a food pantry, 'EZ Meals' (frozen meals), and a 'Produce-to-Go' program for students.   [employer] Social Services [place], [place] Provides information on food and nutrition services, including SNAP benefits and community food resources.

## 2024-02-22 NOTE — Progress Notes (Signed)
 Occupational Therapy Treatment Patient Details Name: Molly Howard MRN: [id number] DOB: 06-04-1985 Today's Date: 02/22/2024   History of present illness Patient is a 38 year old female with acute onset left side weakness, paresthesias, dysarthria, expressive deficits. CT of head with subacute left frontal lobe infarct. PMH:  CHF, status post AICD, CVA, type 2 diabetes mellitus, stage IIIa chronic kidney disease and hypertension as well as COPD, tobacco abuse and polysubstance abuse.   OT comments  Chart reviewed to date, pt greeted in chair, agreeable to OT tx session targeting improving functional activity tolerance in prep for ADL tasks. Pt performs functional mobility with CGA, LB dressing with supervision. Educated pt on continued use of LUE for improved LUE function. Pt is making progress towards goals, OT will continue to follow.       If plan is discharge home, recommend the following:  A little help with walking and/or transfers;A little help with bathing/dressing/bathroom;Assistance with cooking/housework;Help with stairs or ramp for entrance   Equipment Recommendations  None recommended by OT    Recommendations for Other Services      Precautions / Restrictions Precautions Precautions: Fall Recall of Precautions/Restrictions: Intact Restrictions Weight Bearing Restrictions Per Provider Order: No       Mobility Bed Mobility               General bed mobility comments: NT in recliner pre/post session    Transfers Overall transfer level: Needs assistance Equipment used: None Transfers: Sit to/from Stand Sit to Stand: Contact guard assist                 Balance Overall balance assessment: Needs assistance Sitting-balance support: Feet supported Sitting balance-Leahy Scale: Good     Standing balance support: During functional activity, No upper extremity supported Standing balance-Leahy Scale: Fair                             ADL either  performed or assessed with clinical judgement   ADL Overall ADL's : Needs assistance/impaired                     Lower Body Dressing: Supervision/safety;Sitting/lateral leans Lower Body Dressing Details (indicate cue type and reason): donn/doff socks Toilet Transfer: Contact guard assist;Ambulation Toilet Transfer Details (indicate cue type and reason): simulated         Functional mobility during ADLs: Contact guard assist (approx 50'; 49' HHA, then 25' progress to no hand held)      Camera operator     Communication Communication Communication: Impaired Factors Affecting Communication: Difficulty expressing self   Cognition Arousal: Alert Behavior During Therapy: Flat affect Cognition: Cognition impaired, No family/caregiver present to determine baseline           Executive functioning impairment (select all impairments): Problem solving                   Following commands: Intact        Cueing   Cueing Techniques: Verbal cues  Exercises Other Exercises Other Exercises: edu re role of OT, LUE HEP for improved strength/funciton    Shoulder Instructions       General Comments vss    Pertinent Vitals/ Pain       Pain Assessment Pain Assessment: Faces Faces Pain Scale:  Hurts a little bit Pain Location: LUE at IV site Pain Descriptors / Indicators: Discomfort Pain Intervention(s): Repositioned, Monitored during session  Home Living                                          Prior Functioning/Environment              Frequency  Min 2X/week        Progress Toward Goals  OT Goals(current goals can now be found in the care plan section)  Progress towards OT goals: Progressing toward goals  Acute Rehab OT Goals Time For Goal Achievement: 03/06/24  Plan      Co-evaluation                 AM-PAC OT 6 Clicks Daily Activity      Outcome Measure   Help from another person eating meals?: None Help from another person taking care of personal grooming?: None Help from another person toileting, which includes using toliet, bedpan, or urinal?: A Little Help from another person bathing (including washing, rinsing, drying)?: A Little Help from another person to put on and taking off regular upper body clothing?: None Help from another person to put on and taking off regular lower body clothing?: None 6 Click Score: 22    End of Session Equipment Utilized During Treatment: Gait belt  OT Visit Diagnosis: Other abnormalities of gait and mobility (R26.89);Muscle weakness (generalized) (M62.81)   Activity Tolerance Patient tolerated treatment well   Patient Left in chair;with call bell/phone within reach   Nurse Communication Mobility status        Time: 8957-8941 OT Time Calculation (min): 16 min  Charges: OT General Charges $OT Visit: 1 Visit OT Treatments $Therapeutic Activity: 8-22 mins Therisa Sheffield, OTD OTR/L  02/22/24, 1:10 PM

## 2024-02-22 NOTE — Plan of Care (Signed)
  Problem: Education: Goal: Knowledge of disease or condition will improve Outcome: Progressing   Problem: Coping: Goal: Will identify appropriate support needs Outcome: Progressing   Problem: Education: Goal: Knowledge of secondary prevention will improve (MUST DOCUMENT ALL) Outcome: Not Progressing Goal: Knowledge of patient specific risk factors will improve (DELETE if not current risk factor) Outcome: Not Progressing   Problem: Ischemic Stroke/TIA Tissue Perfusion: Goal: Complications of ischemic stroke/TIA will be minimized Outcome: Not Progressing   Problem: Coping: Goal: Will verbalize positive feelings about self Outcome: Not Progressing

## 2024-02-22 NOTE — Evaluation (Signed)
 Speech Language Pathology Evaluation Patient Details Name: Molly Howard MRN: [id number] DOB: 1986/04/09 Today's Date: 02/22/2024 Time: 9064-9049 SLP Time Calculation (min) (ACUTE ONLY): 15 min  Problem List:  Patient Active Problem List   Diagnosis Date Noted   Acute CVA (cerebrovascular accident) (HCC) 02/20/2024   Facial droop 01/06/2024   Right sided weakness 01/06/2024   Expressive aphasia 01/04/2024   History of medication noncompliance 10/12/2023   CHF (congestive heart failure) (HCC) 10/11/2023   Class 1 obesity 08/31/2023   Hyponatremia 08/31/2023   Acute on chronic HFrEF (heart failure with reduced ejection fraction) (HCC) 08/28/2023   COPD exacerbation (HCC) 08/28/2023   Acute kidney injury superimposed on CKD 08/15/2023   Chest pain 08/07/2023   Type 2 diabetes mellitus without complications (HCC) 08/07/2023   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   Primary hypertension 04/16/2023   Controlled type 2 diabetes mellitus without complication, without long-term current use of insulin  (HCC) 04/15/2023   Cardiogenic shock (HCC) 11/14/2022   Bilateral leg edema 11/06/2022   NICM (nonischemic cardiomyopathy) (HCC) 07/31/2022   Hypocalcemia 07/25/2022   Leg edema 07/25/2022   Acute on chronic combined systolic and diastolic CHF (congestive heart failure) (HCC) 07/25/2022   GERD without esophagitis 07/24/2022   Cocaine abuse (HCC) 02/12/2022   Acute pulmonary edema (HCC)    Acute systolic heart failure (HCC) 02/09/2022   Syncope 02/09/2022   Menorrhagia with regular cycle 06/22/2021   Dyslipidemia 11/27/2020   Type II diabetes mellitus with renal manifestations (HCC) 11/27/2020   Chronic kidney disease (CKD), stage II (mild) 11/27/2020   Hypomagnesemia 10/17/2020   Uncontrolled type 2 diabetes mellitus with hyperglycemia, without long-term current use of insulin  (HCC) 10/11/2020   Dyspnea 05/21/2020   Essential hypertension 05/20/2020    Diabetes mellitus, type II (HCC) 05/20/2020   Chronic anemia 05/20/2020   Reactive thrombocytosis 05/20/2020   Obesity (BMI 30-39.9) 05/20/2020   Chronic combined systolic and diastolic heart failure (HCC) 11/01/2019   Financial difficulties 11/01/2019   Medication management 11/01/2019   Lung nodule 11/01/2019   Nonischemic cardiomyopathy (HCC) 11/01/2019   Polysubstance abuse (HCC) 11/01/2019   Tobacco use 11/01/2019   HFrEF (heart failure with reduced ejection fraction) (HCC)    Swelling    Anemia 10/02/2019   Past Medical History:  Past Medical History:  Diagnosis Date   Acid reflux    Chronic HFrEF (heart failure with reduced ejection fraction) (HCC)    a. 10/2019 Echo: EF 35-40%, GrII DD; b. 05/2020 Echo: EF 20-25%, glob HK; c. 10/2020 Echo: EF 30-35%, glob HK. GrII DD, Mildly red RV fxn. Mod TR; d.  05/2021 cMRI: EF 42%, no LGE. Nl RV size/fxn.   CKD (chronic kidney disease), stage II    Diabetes mellitus (HCC)    H/O medication noncompliance    Hypertension    Microcytic anemia    NICM (nonischemic cardiomyopathy) (HCC)    a. 10/2019 Echo: EF 35-40%; b. 10/2019 MV: No ischemia. Small apical defect-->breast attenuation; c. 05/2020 Echo: EF 20-25%; d. 10/2020 Echo: EF 30-35%, glob HK. GrII DD, Mildly red RV fxn. Mod TR; e. 05/2021 cMRI: EF 42%, no LGE. Nl RV size/fxn.   Obesity    Polysubstance abuse [facility])    Past Surgical History:  Past Surgical History:  Procedure Laterality Date   CHOLECYSTECTOMY     HERNIA REPAIR     ICD IMPLANT     RIGHT HEART CATH N/A 07/28/2022   Procedure: RIGHT HEART CATH;  Surgeon: Darron Deatrice LABOR, MD;  Location: [facility] INVASIVE CV LAB;  Service: Cardiovascular;  Laterality: N/A;   SUBQ ICD IMPLANT N/A 06/27/2023   Procedure: SUBQ ICD IMPLANT;  Surgeon: Cindie Ole DASEN, MD;  Location: [facility] INVASIVE CV LAB;  Service: Cardiovascular;  Laterality: N/A;   HPI:  Molly Howard is a 38 y.o. African-American female with medical history significant for  systolic CHF, status post AICD, CVA, type 2 diabetes mellitus, stage IIIa chronic kidney disease and hypertension as well as COPD, tobacco abuse and polysubstance abuse, who presented to the ER on 02/20/2024 with acute onset of left-sided weakness that started around 6-7 PM with associated paresthesias and dysarthria and expressive dysphasia. Questionable admission to outside hospital 4 days ago although unable to find information in care everywhere CT shows subacute left frontal lobe infarct with increased size of the hypodense region compared to 01/04/2024 possibly reflecting an early subacute component superimposed on the late subacute to early chronic infarct in this region.  The CT angiography although showed a right MCA M2 branch occlusion within the right insula which on neurointerventionalist Dr. Rosslyn review was more distal-more like an M3 occlusion.     Pt recently admitted at [facility] from 01/04/2024 thru 01/06/2024 d/t aphasia.   MRI on 01/04/2024 revealed:   1. No acute infarct. Limited evaluation as above.   2. 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   Assessment / Plan / Recommendation Clinical Impression  Pt presents as awake/alert with good recall of basic information related to current medical situation, visitors and basic needs (needs charger cable for her phone). Pt's nurse continues to report good ability to communicate verbally.    At present her verbal expression is free of word finding deficits but lacks prosody and is staccato in rthym. Pt's speech intelligibility is 100%. It was difficult to ascertain if this is pt's baseline or acute as pt presents with suspect baseline open mouth posture with anterior tongue resting position. She reports I can't read texts, since my last stroke, I can't read them. Pt's phone was unable to turn on d/t pt not have a power charger so this Clinical research associate was not able to assess font or set the phone to read her messages. Will attempt to  locate a charger to further assess. During this evaluation, pt able to read printed sentences with no difficulty and improved prosody. ST to further target electronic communications to improve functional independence.      SLP Assessment  SLP Recommendation/Assessment: Patient needs continued Speech Language Pathology Services SLP Visit Diagnosis: Dysarthria and anarthria (R47.1);Aphasia (R47.01);Apraxia (R48.2)     Assistance Recommended at Discharge  None  Functional Status Assessment Patient has had a recent decline in their functional status and/or demonstrates limited ability to make significant improvements in function in a reasonable and predictable amount of time  Frequency and Duration min 2x/week  2 weeks      SLP Evaluation Cognition  Overall Cognitive Status: No family/caregiver present to determine baseline cognitive functioning       Comprehension  Auditory Comprehension Overall Auditory Comprehension: Appears within functional limits for tasks assessed Reading Comprehension Reading Status: Within funtional limits (unable to assess text messages as pt's phone was unable to turn on (lack of battery))    Expression Expression Primary Mode of Expression: Verbal Verbal Expression Overall Verbal Expression: Appears within functional limits for tasks assessed Written Expression Dominant Hand: Right Written Expression: Not tested   Oral / Motor  Oral Motor/Sensory Function Overall Oral Motor/Sensory Function: Within  functional limits Motor Speech Overall Motor Speech: Appears within functional limits for tasks assessed Respiration: Within functional limits Phonation: Normal Resonance: Within functional limits Articulation: Within functional limitis Intelligibility: Intelligible Motor Planning: Within functional limits Motor Speech Errors: Not applicable           Aolanis Crispen B. Rubbie, M.S., CCC-SLP, Tree surgeon Certified Brain Injury  Specialist [facility]  [facility] Rehabilitation Services Office [phone] Ascom [phone] Fax [phone]

## 2024-02-22 NOTE — Progress Notes (Signed)
 Heart Failure Navigator Progress Note  Assessed for Heart & Vascular TOC clinic readiness.  Patient does not meet criteria due to current Advanced Heart Failure Team patient. Patient has a scheduled follow-up appointment already on 02/29/24 @ 11:00. Navigator added follow-up appointment to her discharge AVS.  Navigator will sign off at this time.  Charmaine Pines, RN, BSN [facility] Heart Failure Navigator Secure Chat Only

## 2024-02-22 NOTE — Progress Notes (Signed)
 Progress Note   Patient: Molly Howard FMW:[id number] DOB: [birth year] DOA: 02/20/2024     2 DOS: the patient was seen and examined on 02/22/2024   Brief hospital course:   Molly Howard is a 38 y.o. female with medical history significant for systolic CHF, status post AICD, CVA, type 2 diabetes mellitus, stage IIIa chronic kidney disease and hypertension as well as COPD, tobacco abuse and polysubstance abuse, who presented to the ER with left-sided weakness - 4 days R weakness w/ worsening weakness and speech change.    10/20: to ED. Imaging concerning for new subacute / worsening old subacute or chronic infarct. CTA w/ MCA occlusion. Neuro consult, d/w neurointerventionalist, defer thrombectomy. Admit SDU, q2h neuro checks, reimage if worse. Admitted to hospitalist w/ neurology consult  10/21: Echo pending. PT/OT ok for home health but pt is unhoused. Neuro recs CT head repeat at 24h from previous. If no issues on echo / CT can likely DC in AM tomorrow          Consultants:  neurology   Procedures/Surgeries: none    ASSESSMENT & PLAN:   Acute CVA (cerebrovascular accident)  left-sided weakness and numbness with dysarthria and expressive dysphasia. neuro checks  aspirin  and Plavix.   Echo report reviewed Since head CT scan showed CVA and she has a pacemaker, did not order brain MRI. neurology to follow  physical/occupation/speech therapy consults  statin therapy and fasting lipids will be checked  Discharged with home health Continue PT OT   Dyslipidemia statin therapy.   Essential hypertension Permissive HTN    HFrEF (heart failure with reduced ejection fraction) (HCC) She does not have any current exacerbation. Will continue digoxin , Farxiga  and Coreg  for CHF and avoid rebound tachycardia Holding Aldactone  and Entresto  and allowing permissive HTN     Class 2 / Class 3 obesity based on BMI: Body mass index is 38.67 kg/m. Super-super Morbid Obesity: BMI  60+ Healthy nutrition and physical activity advised as adjunct to other disease management and risk reduction treatments       DVT prophylaxis: lovenox  IV fluids: no continuous IV fluids  Nutrition: cardiac Central lines / other devices: none   Code Status: full code    Subjective / Brief ROS:  Admits to improvement in general condition Denies nausea vomiting chest pain cough   Family Communication: none at this time     Physical Exam Constitutional:      General: She is not in acute distress. Cardiovascular:     Rate and Rhythm: Normal rate and regular rhythm.  Pulmonary:     Effort: Pulmonary effort is normal.     Breath sounds: Normal breath sounds.  Abdominal:     Palpations: Abdomen is soft.  Musculoskeletal:     Right lower leg: No edema.     Left lower leg: No edema.  Skin:    General: Skin is warm and dry.  Neurological:     Mental Status: She is alert and oriented to person, place, and time.    Vitals:   02/22/24 0800 02/22/24 0900 02/22/24 1200 02/22/24 1600  BP: 131/89 (!) 121/90  (!) 129/93  Pulse: 76  74 75  Resp:  (!) 28    Temp: (!) 97.4 F (36.3 C)  97.8 F (36.6 C) 98.3 F (36.8 C)  TempSrc: Axillary  Oral Oral  SpO2: 98%  97%   Weight:      Height:          Latest Ref Rng &  Units 02/21/2024    4:38 AM 02/20/2024    8:58 PM 01/16/2024    1:39 PM  CBC  WBC 4.0 - 10.5 K/uL 4.6  5.5  5.7   Hemoglobin 12.0 - 15.0 g/dL 88.7  88.1  88.9   Hematocrit 36.0 - 46.0 % 36.8  39.5  36.5   Platelets 150 - 400 K/uL 262  310  373        Latest Ref Rng & Units 02/21/2024    4:38 AM 02/20/2024    8:58 PM 01/25/2024   11:01 AM  BMP  Glucose 70 - 99 mg/dL 804  830  826   BUN 6 - 20 mg/dL 26  29  29    Creatinine 0.44 - 1.00 mg/dL 8.54  8.39  8.61   Sodium 135 - 145 mmol/L 135  137  139   Potassium 3.5 - 5.1 mmol/L 4.0  4.4  4.7   Chloride 98 - 111 mmol/L 103  101  101   CO2 22 - 32 mmol/L 26  22  28    Calcium  8.9 - 10.3 mg/dL 7.7  8.1  8.1       Author: Drue ONEIDA Potter, MD 02/22/2024 4:50 PM  For on call review [email or web address].

## 2024-02-22 NOTE — Progress Notes (Signed)
 Physical Therapy Treatment Patient Details Name: VELIA PAMER MRN: [id number] DOB: July 09, 1985 Today's Date: 02/22/2024   History of Present Illness Patient is a 38 year old female with acute onset left side weakness, paresthesias, dysarthria, expressive deficits. CT of head with subacute left frontal lobe infarct. PMH:  CHF, status post AICD, CVA, type 2 diabetes mellitus, stage IIIa chronic kidney disease and hypertension as well as COPD, tobacco abuse and polysubstance abuse.    PT Comments  Pt extremely pleasant and willing to work with PT.  She showed great effort with and w/o AD, but was clearly more confidence, faster and more steady with walker than during ~118ft worth of trails w/o.  Pt managed steps well, even managing reciprocal effort ascending the steps with single rail.  Pt VSS t/o the effort though she does endorse some subjective fatigue after ~500 ft of ambulation activity.  Pt will benefit from continued PT, continue with POC.     If plan is discharge home, recommend the following: Assist for transportation   Can travel by private vehicle        Equipment Recommendations  Rolling walker (2 wheels)    Recommendations for Other Services       Precautions / Restrictions Precautions Precautions: Fall Recall of Precautions/Restrictions: Intact Restrictions Weight Bearing Restrictions Per Provider Order: No     Mobility  Bed Mobility               General bed mobility comments: NT in recliner pre/post session    Transfers Overall transfer level: Needs assistance Equipment used: Rolling walker (2 wheels), None Transfers: Sit to/from Stand Sit to Stand: Contact guard assist           General transfer comment: able to rise both with and w/o AD, clearly more confident and comfortable with the UE support    Ambulation/Gait Ambulation/Gait assistance: Contact guard assist Gait Distance (Feet): 500 Feet Assistive device: Rolling walker (2 wheels), None          General Gait Details: ~400 ft with walker and ~100 ft without (occasionaly light single UE rail use) - pt clearly more consistent and confident with the walker but did not have any overt LOBs or unsteadiness with slower more guarded ambulation w/o.  Pt's O2 high 90s and HR in the 80s with prolonged walk.   Stairs Stairs: Yes Stairs assistance: Supervision Stair Management: One rail Right, Alternating pattern, Step to pattern (step-to descending, recip ascending)       Wheelchair Mobility     Tilt Bed    Modified Rankin (Stroke Patients Only)       Balance Overall balance assessment: Needs assistance Sitting-balance support: Feet supported Sitting balance-Leahy Scale: Good     Standing balance support: No upper extremity supported Standing balance-Leahy Scale: Fair Standing balance comment: good with AD use, guarded and hesitant w/o                            Communication Communication Communication: Impaired Factors Affecting Communication: Difficulty expressing self  Cognition Arousal: Alert Behavior During Therapy: Flat affect                           PT - Cognition Comments: Minimal, slow responses at times. grossly oriented and able to follow single step commands consistently with increased time Following commands: Intact      Cueing Cueing Techniques: Verbal cues  Exercises  General Comments General comments (skin integrity, edema, etc.): O2 high 90s on room air, HR 80s with activity      Pertinent Vitals/Pain Pain Assessment Faces Pain Scale: Hurts a little bit Pain Location: LUE at IV site    Home Living       Type of Home: Homeless                  Prior Function            PT Goals (current goals can now be found in the care plan section) Progress towards PT goals: Progressing toward goals    Frequency    Min 2X/week      PT Plan      Co-evaluation              AM-PAC PT 6  Clicks Mobility   Outcome Measure  Help needed turning from your back to your side while in a flat bed without using bedrails?: None Help needed moving from lying on your back to sitting on the side of a flat bed without using bedrails?: A Little Help needed moving to and from a bed to a chair (including a wheelchair)?: A Little Help needed standing up from a chair using your arms (e.g., wheelchair or bedside chair)?: A Little Help needed to walk in hospital room?: A Little Help needed climbing 3-5 steps with a railing? : A Little 6 Click Score: 19    End of Session Equipment Utilized During Treatment: Gait belt Activity Tolerance: Patient tolerated treatment well Patient left: in chair;with call bell/phone within reach Nurse Communication: Mobility status PT Visit Diagnosis: Difficulty in walking, not elsewhere classified (R26.2);Other abnormalities of gait and mobility (R26.89)     Time: 1720-1737 PT Time Calculation (min) (ACUTE ONLY): 17 min  Charges:    $Gait Training: 8-22 mins PT General Charges $$ ACUTE PT VISIT: 1 Visit                     Carmin JONELLE Deed, DPT 02/22/2024, 5:45 PM

## 2024-02-22 NOTE — TOC Initial Note (Signed)
 Transition of Care [facility]) - Initial/Assessment Note    Patient Details  Name: Molly Howard MRN: [id number] Date of Birth: 06-07-1985  Transition of [facility]) CM/SW Contact:    Corrie JINNY Ruts, LCSW Phone Number: 02/22/2024, 3:29 PM  Clinical Narrative:                 Chart reviewed. The patient was admitted for acute CVA. I received a consult for the patient being homeless and [facility] recommendation. I was able to speak with the patient at bedside today. I introduced myself, my role, and reason for consult. The patient reports that she has a PCP. The patient reports that she lives in a car with her sister. The patient reports that she is able to do everything independently. The patient reports that she is unsure about who will assist her during D/C because her sister car is not working. The patient reports that she has a father but she is not sure if he will help or if she is able to stay with him.   The patient reports that she uses [facility] pharmacy. The patient reports that she has never has HH or been admitted into a SNF in the past. The patient reports that she does not have any equipment.   I explained to the patient that [facility] was the rec but there aren't any agencies that takes her insurance the patient verbalized understanding. I suggested to the patient to do outpatient PT/OT. The patient was agreeable. The patient would like to come to [facility] for PT/OT.   I offered the patient shelter resources and transportation resources and the patient was also accepting. There are no other TOC needs at this time.     Barriers to Discharge: Continued Medical Work up   Patient Goals and CMS Choice            Expected Discharge Plan and Services                                              Prior Living Arrangements/Services     Patient language and need for interpreter reviewed:: Yes        Need for Family Participation in Patient Care: Yes (Comment)     Criminal Activity/Legal  Involvement Pertinent to Current Situation/Hospitalization: No - Comment as needed  Activities of Daily Living   ADL Screening (condition at time of admission) Independently performs ADLs?: No Does the patient have a NEW difficulty with bathing/dressing/toileting/self-feeding that is expected to last >3 days?: No Does the patient have a NEW difficulty with getting in/out of bed, walking, or climbing stairs that is expected to last >3 days?: No Does the patient have a NEW difficulty with communication that is expected to last >3 days?: No Is the patient deaf or have difficulty hearing?: No Does the patient have difficulty seeing, even when wearing glasses/contacts?: No Does the patient have difficulty concentrating, remembering, or making decisions?: No  Permission Sought/Granted                  Emotional Assessment Appearance:: Appears stated age Attitude/Demeanor/Rapport: Gracious Affect (typically observed): Pleasant Orientation: : Oriented to Self, Oriented to Situation, Oriented to Place, Oriented to  Time Alcohol / Substance Use: Not Applicable Psych Involvement: No (comment)  Admission diagnosis:  Left-sided weakness [R53.1] Acute CVA (cerebrovascular accident) (HCC) [I63.9] 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 (HCC) [I63.511] Patient Active Problem List   Diagnosis Date Noted   Acute CVA (cerebrovascular accident) (HCC) 02/20/2024   Facial droop 01/06/2024   Right sided weakness 01/06/2024   Expressive aphasia 01/04/2024   History of medication noncompliance 10/12/2023   CHF (congestive heart failure) (HCC) 10/11/2023   Class 1 obesity 08/31/2023   Hyponatremia 08/31/2023   Acute on chronic HFrEF (heart failure with reduced ejection fraction) (HCC) 08/28/2023   COPD exacerbation (HCC) 08/28/2023   Acute kidney injury superimposed on CKD 08/15/2023   Chest pain 08/07/2023   Type 2 diabetes mellitus without complications (HCC)  08/07/2023   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   Primary hypertension 04/16/2023   Controlled type 2 diabetes mellitus without complication, without long-term current use of insulin  (HCC) 04/15/2023   Cardiogenic shock (HCC) 11/14/2022   Bilateral leg edema 11/06/2022   NICM (nonischemic cardiomyopathy) (HCC) 07/31/2022   Hypocalcemia 07/25/2022   Leg edema 07/25/2022   Acute on chronic combined systolic and diastolic CHF (congestive heart failure) (HCC) 07/25/2022   GERD without esophagitis 07/24/2022   Cocaine abuse (HCC) 02/12/2022   Acute pulmonary edema (HCC)    Acute systolic heart failure (HCC) 02/09/2022   Syncope 02/09/2022   Menorrhagia with regular cycle 06/22/2021   Dyslipidemia 11/27/2020   Type II diabetes mellitus with renal manifestations (HCC) 11/27/2020   Chronic kidney disease (CKD), stage II (mild) 11/27/2020   Hypomagnesemia 10/17/2020   Uncontrolled type 2 diabetes mellitus with hyperglycemia, without long-term current use of insulin  (HCC) 10/11/2020   Dyspnea 05/21/2020   Essential hypertension 05/20/2020   Diabetes mellitus, type II (HCC) 05/20/2020   Chronic anemia 05/20/2020   Reactive thrombocytosis 05/20/2020   Obesity (BMI 30-39.9) 05/20/2020   Chronic combined systolic and diastolic heart failure (HCC) 11/01/2019   Financial difficulties 11/01/2019   Medication management 11/01/2019   Lung nodule 11/01/2019   Nonischemic cardiomyopathy (HCC) 11/01/2019   Polysubstance abuse (HCC) 11/01/2019   Tobacco use 11/01/2019   HFrEF (heart failure with reduced ejection fraction) (HCC)    Swelling    Anemia 10/02/2019   PCP:  [name], DO Pharmacy:   [facility] [address] [place] [address] Phone: [phone] Fax: [phone]     Social Drivers of Health (SDOH) Social History: SDOH Screenings   Food Insecurity: No Food Insecurity (02/21/2024)   Housing: High Risk (02/21/2024)  Transportation Needs: Unmet Transportation Needs (02/21/2024)  Utilities: Not At Risk (02/21/2024)  Alcohol Screen: Low Risk  (04/15/2023)  Depression (PHQ2-9): Low Risk  (01/16/2024)  Financial Resource Strain: High Risk (08/25/2023)  Social Connections: Unknown (08/28/2023)  Tobacco Use: Medium Risk (01/25/2024)   SDOH Interventions:     Readmission Risk Interventions    02/22/2024    3:27 PM 01/06/2024   10:15 AM 11/08/2022    3:23 PM  Readmission Risk Prevention Plan  Transportation Screening Complete Complete Complete  Medication Review (RN Care Manager) Complete Referral to Pharmacy Complete  PCP or Specialist appointment within 3-5 days of discharge Complete Complete Complete  HRI or Home Care Consult  Complete   SW Recovery Care/Counseling Consult Complete Complete Complete  Palliative Care Screening Not Applicable Not Applicable Not Applicable  Skilled Nursing Facility Not Applicable Not Applicable Not Applicable

## 2024-02-23 ENCOUNTER — Other Ambulatory Visit: Payer: Self-pay

## 2024-02-23 DIAGNOSIS — I639 Cerebral infarction, unspecified: Secondary | ICD-10-CM | POA: Diagnosis not present

## 2024-02-23 MED ORDER — CLOPIDOGREL BISULFATE 75 MG PO TABS
75.0000 mg | ORAL_TABLET | Freq: Every day | ORAL | 0 refills | Status: AC
  Filled 2024-02-23: qty 21, 21d supply, fill #0

## 2024-02-23 MED ORDER — CARVEDILOL 12.5 MG PO TABS
6.2500 mg | ORAL_TABLET | Freq: Two times a day (BID) | ORAL | 1 refills | Status: DC
  Filled 2024-02-23: qty 90, 90d supply, fill #0

## 2024-02-23 MED ORDER — ATORVASTATIN CALCIUM 40 MG PO TABS
40.0000 mg | ORAL_TABLET | Freq: Every day | ORAL | 12 refills | Status: DC
  Filled 2024-02-23: qty 30, 30d supply, fill #0

## 2024-02-23 MED ORDER — ASPIRIN 81 MG PO TBEC
81.0000 mg | DELAYED_RELEASE_TABLET | Freq: Every day | ORAL | 12 refills | Status: DC
  Filled 2024-02-23: qty 30, 30d supply, fill #0

## 2024-02-23 NOTE — Progress Notes (Signed)
 Occupational Therapy Treatment Patient Details Name: Molly Howard MRN: [id number] DOB: 07-17-1985 Today's Date: 02/23/2024   History of present illness Patient is a 38 year old female with acute onset left side weakness, paresthesias, dysarthria, expressive deficits. CT of head with subacute left frontal lobe infarct. PMH:  CHF, status post AICD, CVA, type 2 diabetes mellitus, stage IIIa chronic kidney disease and hypertension as well as COPD, tobacco abuse and polysubstance abuse.   OT comments  Chart reviewed to date, pt greeted in chair, oriented x4, agreeable to OT tx session targeting improving functional activity tolerance in prep for ADL tasks. Please see further details below. Pt continues to make progress towards goals, discharge recommendation remains appropriate. OT will continue to follow.       If plan is discharge home, recommend the following:  A little help with walking and/or transfers;A little help with bathing/dressing/bathroom;Assistance with cooking/housework;Help with stairs or ramp for entrance   Equipment Recommendations  Other (comment) (2WW)    Recommendations for Other Services      Precautions / Restrictions Precautions Precautions: Fall Recall of Precautions/Restrictions: Intact Restrictions Weight Bearing Restrictions Per Provider Order: No       Mobility Bed Mobility               General bed mobility comments: NT in recliner pre/post session    Transfers Overall transfer level: Needs assistance Equipment used: None Transfers: Sit to/from Stand Sit to Stand: Supervision                 Balance Overall balance assessment: Needs assistance Sitting-balance support: Feet supported Sitting balance-Leahy Scale: Good     Standing balance support: Bilateral upper extremity supported, During functional activity Standing balance-Leahy Scale: Good                             ADL either performed or assessed with clinical  judgement   ADL Overall ADL's : Needs assistance/impaired     Grooming: Wash/dry face;Sitting;Set up           Upper Body Dressing : Supervision/safety;Sitting Upper Body Dressing Details (indicate cue type and reason): donn gown     Toilet Transfer: Supervision/safety;Rolling walker (2 wheels);Ambulation Toilet Transfer Details (indicate cue type and reason): simulated         Functional mobility during ADLs: Supervision/safety;Rolling walker (2 wheels) (approx 200')      Extremity/Trunk Assessment              Vision       Perception     Praxis     Communication Communication Communication: Impaired Factors Affecting Communication: Difficulty expressing self   Cognition Arousal: Alert Behavior During Therapy: WFL for tasks assessed/performed Cognition: Cognition impaired, No family/caregiver present to determine baseline           Executive functioning impairment (select all impairments): Problem solving                   Following commands: Intact        Cueing   Cueing Techniques: Verbal cues  Exercises Other Exercises Other Exercises: edu re role of OT, importance of continued    Shoulder Instructions       General Comments vss    Pertinent Vitals/ Pain       Pain Assessment Pain Assessment: No/denies pain  Home Living  Prior Functioning/Environment              Frequency  Min 2X/week        Progress Toward Goals  OT Goals(current goals can now be found in the care plan section)  Progress towards OT goals: Progressing toward goals  Acute Rehab OT Goals Time For Goal Achievement: 03/06/24  Plan      Co-evaluation                 AM-PAC OT 6 Clicks Daily Activity     Outcome Measure   Help from another person eating meals?: None Help from another person taking care of personal grooming?: None Help from another person toileting, which  includes using toliet, bedpan, or urinal?: A Little Help from another person bathing (including washing, rinsing, drying)?: A Little Help from another person to put on and taking off regular upper body clothing?: None Help from another person to put on and taking off regular lower body clothing?: None 6 Click Score: 22    End of Session Equipment Utilized During Treatment: Rolling walker (2 wheels)  OT Visit Diagnosis: Other abnormalities of gait and mobility (R26.89);Muscle weakness (generalized) (M62.81)   Activity Tolerance Patient tolerated treatment well   Patient Left in bed;with call bell/phone within reach   Nurse Communication Mobility status        Time: 1055-1105 OT Time Calculation (min): 10 min  Charges: OT General Charges $OT Visit: 1 Visit OT Treatments $Therapeutic Activity: 8-22 mins Therisa Sheffield, OTD OTR/L  02/23/24, 11:13 AM

## 2024-02-23 NOTE — Progress Notes (Signed)
 Patient to be discharged. Patient provided with numbers to the [facility] in [place] and the [facility] in [place] to call to see if they can provide shelter for her.  Patient will need taxi voucher to whatever location is agreeable. SW aware.

## 2024-02-23 NOTE — Progress Notes (Signed)
 Introduced patient to role of Statistician. Intake questions completed.  Patient is currently living in her sister's car, with her older sister. She states she has been in this situation for about 3 weeks. Prior to that she had an apartment she was placed in by [facility] (homeless shelter) after having been a resident at the shelter for a few months. Patient states the apartment was at [employer]. Unfortunately is seems she was not on the lease, only her roommate was, and thus she was kicked out.  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 Patient states she feels at this point she is unable to work because of her repeated strokes.  Patient does receive food stamps, but has no income at this point. Patient states Bishop Vicenta Seip is supposed to be bringing her letter of determination from disability today.  Patient reports she is no longer allowed to go to [employer] because she has used up her 180 days there. Patient would prefer going to a shelter somewhere over returning to the car with her sister.  Patient requesting clothing for d/c. Provided with a change of clothing.  Care team made aware of all preceding information.

## 2024-02-23 NOTE — Plan of Care (Signed)
  Problem: Education: Goal: Knowledge of patient specific risk factors will improve (DELETE if not current risk factor) Outcome: Progressing   Problem: Coping: Goal: Will identify appropriate support needs Outcome: Progressing   Problem: Self-Care: Goal: Ability to participate in self-care as condition permits will improve Outcome: Progressing   Problem: Nutrition: Goal: Dietary intake will improve Outcome: Progressing

## 2024-02-23 NOTE — Discharge Summary (Signed)
 Physician Discharge Summary   Patient: Molly Howard MRN: [id number] DOB: [birth year]  Admit date:     02/20/2024  Discharge date: 02/23/24  Discharge Physician: Drue ONEIDA Potter   PCP: Molly Fend, DO   Recommendations at discharge:  Follow-up with PCP  Discharge Diagnoses: Principal Problem:   Acute CVA (cerebrovascular accident) (HCC) Active Problems:   Essential hypertension   Dyslipidemia   HFrEF (heart failure with reduced ejection fraction) (HCC)  Resolved Problems:   * No resolved hospital problems. *  Hospital Course:  Molly Howard is a 38 y.o. female with medical history significant for systolic CHF, status post AICD, CVA, type 2 diabetes mellitus, stage IIIa chronic kidney disease and hypertension as well as COPD, tobacco abuse and polysubstance abuse, who presented to the ER with left-sided weakness - 4 days R weakness w/ worsening weakness and speech change.  In the emergency room imaging concerning for new subacute / worsening old subacute or chronic infarct. CTA w/ MCA occlusion.  Patient was seen by neurologist and, d/w neurointerventionalist, defer thrombectomy.  Was subsequently admitted and echo showed reduced EF which is chronic.  Patient was seen by PT OT and able to walk about and have therefore been cleared for discharge today will follow-up with outpatient neurologist.    Consultants:  neurology   Procedures/Surgeries: none    Consultants: Neurologist Procedures performed: None Disposition: Home Diet recommendation:  Cardiac diet DISCHARGE MEDICATION: Allergies as of 02/23/2024       Reactions   Other Rash   Lemons         Medication List     TAKE these medications    Accu-Chek Softclix Lancets lancets Use as instructed   Aspirin  Low Dose 81 MG tablet Generic drug: aspirin  EC Take 1 tablet (81 mg total) by mouth daily. Swallow whole. Start taking on: February 24, 2024   atorvastatin  40 MG tablet Commonly known as: LIPITOR Take  1 tablet (40 mg total) by mouth daily. Start taking on: February 24, 2024 What changed:  medication strength how much to take   carvedilol  12.5 MG tablet Commonly known as: COREG  Take 0.5 tablets (6.25 mg total) by mouth 2 (two) times daily with a meal. What changed: how much to take   clopidogrel 75 MG tablet Commonly known as: PLAVIX Take 1 tablet (75 mg total) by mouth daily for 21 days. Start taking on: February 24, 2024   digoxin  0.125 MG tablet Commonly known as: LANOXIN  Take 0.5 tablets (0.0625 mg total) by mouth daily.   Entresto  97-103 MG Generic drug: sacubitril -valsartan  Take 1 tablet by mouth 2 (two) times daily.   Farxiga  10 MG Tabs tablet Generic drug: dapagliflozin  propanediol Take 1 tablet (10 mg total) by mouth once daily.   FeroSul 325 (65 Fe) MG tablet Generic drug: ferrous sulfate  Take 1 tablet (325 mg total) by mouth 2 (two) times daily.   FreeStyle Libre 3 Sensor Misc Place 1 sensor on the skin every 14 days. Use to check glucose continuously   Ozempic  (0.25 or 0.5 MG/DOSE) 2 MG/3ML Sopn Generic drug: Semaglutide (0.25 or 0.5MG /DOS) Inject 0.25 mg into the skin once a week.   spironolactone  25 MG tablet Commonly known as: ALDACTONE  Take 1 tablet (25 mg total) by mouth daily.   torsemide  20 MG tablet Commonly known as: DEMADEX  Take 3 tablets (60 mg total) by mouth daily.        Follow-up Information     [facility]. Go on  02/29/2024.   Specialty: Cardiology Why: Hospital Follow-Up 02/29/24 @ 11:00 Please bring all medications to follow-up appointment [facility], [address], [address] Free Valet Parking at the door Contact information: [address] [place]  [address] [phone]               Discharge Exam: Molly Howard   02/20/24 2117  Weight: 112 kg     General: She is not in acute distress. Cardiovascular:     Rate and Rhythm:  Normal rate and regular rhythm.  Pulmonary:     Effort: Pulmonary effort is normal.     Breath sounds: Normal breath sounds.  Abdominal:     Palpations: Abdomen is soft.  Musculoskeletal:     Right lower leg: No edema.     Left lower leg: No edema.  Skin:    General: Skin is warm and dry.  Neurological:     Mental Status: She is alert and oriented to person, place, and time.     Condition at discharge: good  The results of significant diagnostics from this hospitalization (including imaging, microbiology, ancillary and laboratory) are listed below for reference.   Imaging Studies: CT HEAD WO CONTRAST ( ) Result Date: 02/21/2024 EXAM: CT HEAD WITHOUT CONTRAST 02/21/2024 08:14:52 PM TECHNIQUE: CT of the head was performed without the administration of intravenous contrast. Automated exposure control, iterative reconstruction, and/or weight based adjustment of the mA/kV was utilized to reduce the radiation dose to as low as reasonably achievable. COMPARISON: 02/20/2024 CLINICAL HISTORY: followup 24h from last CT eval CVA stability in pt unable to get MRI FINDINGS: BRAIN AND VENTRICLES: No acute hemorrhage. Stable left frontal subacute / chronic infarct. Stable chronic left cerebellar infarct. Stable small right cerebellar infarct. No hydrocephalus. No extra-axial collection. No mass effect or midline shift. ORBITS: No acute abnormality. SINUSES: No acute abnormality. SOFT TISSUES AND SKULL: No acute soft tissue abnormality. No skull fracture. IMPRESSION: 1. Unchanged appearance of multiple nonacute infarcts including the left frontal lobe and both cerebellar hemispheres Electronically signed by: Franky Stanford MD 02/21/2024 09:27 PM EDT RP Workstation: [id number]   ECHOCARDIOGRAM COMPLETE BUBBLE STUDY Result Date: 02/21/2024    ECHOCARDIOGRAM REPORT   Patient Name:   Molly Howard Date of Exam: 02/21/2024 Medical Rec #:  [id number]     Height:       67.0 in Accession #:    [id number]    Weight:        246.9 lb Date of Birth:  03-Oct-1985     BSA:          2.212 m Patient Age:    38 years      BP:           129/108 mmHg Patient Gender: F             HR:           82 bpm. Exam Location:  [facility] Procedure: 2D Echo, Cardiac Doppler, Color Doppler, Saline Contrast Bubble Study            and Strain Analysis (Both Spectral and Color Flow Doppler were            utilized during procedure). Indications:     Stroke 434.91 / I63.9  History:         Patient has prior history of Echocardiogram examinations, most                  recent 08/07/2023. Risk Factors:Hypertension. Non-ischemic  cardiomyopathy.  Sonographer:     Christopher Furnace Referring Phys:  [id number] JAN A MANSY Diagnosing Phys: Lonni Hanson MD  Sonographer Comments: Global longitudinal strain was attempted. IMPRESSIONS  1. Left ventricular ejection fraction, by estimation, is 25 to 30%. The left ventricle has severely decreased function. The left ventricle demonstrates global hypokinesis. The left ventricular internal cavity size was mildly dilated. There is moderate asymmetric left ventricular hypertrophy of the infero-lateral segment. Left ventricular diastolic parameters are consistent with Grade III diastolic dysfunction (restrictive). Elevated left atrial pressure. The average left ventricular global longitudinal strain is -4.2 %. The global longitudinal strain is abnormal.  2. Pulmonary artery pressure is at least mildly elevated (RVSP = 35-40 mmHg plus central venous/right atrial pressure). Right ventricular systolic function is moderately reduced. The right ventricular size is mildly enlarged. Mildly increased right ventricular wall thickness.  3. Left atrial size was mildly dilated.  4. The mitral valve is normal in structure. Trivial mitral valve regurgitation.  5. Tricuspid valve regurgitation is moderate to severe.  6. The aortic valve is tricuspid. Aortic valve regurgitation is mild. No aortic stenosis is present.  7. Agitated saline  contrast bubble study was negative, with no evidence of any interatrial shunt. FINDINGS  Left Ventricle: Left ventricular ejection fraction, by estimation, is 25 to 30%. The left ventricle has severely decreased function. The left ventricle demonstrates global hypokinesis. The average left ventricular global longitudinal strain is -4.2 %. Strain was performed and the global longitudinal strain is abnormal. The left ventricular internal cavity size was mildly dilated. There is moderate asymmetric left ventricular hypertrophy of the infero-lateral segment. Left ventricular diastolic parameters are consistent with Grade III diastolic dysfunction (restrictive). Elevated left atrial pressure. Right Ventricle: Pulmonary artery pressure is at least mildly elevated (RVSP = 35-40 mmHg plus central venous/right atrial pressure). The right ventricular size is mildly enlarged. Mildly increased right ventricular wall thickness. Right ventricular systolic function is moderately reduced. Left Atrium: Left atrial size was mildly dilated. Right Atrium: Right atrial size was normal in size. Pericardium: The pericardium was not well visualized. Mitral Valve: The mitral valve is normal in structure. Trivial mitral valve regurgitation. Tricuspid Valve: The tricuspid valve is normal in structure. Tricuspid valve regurgitation is moderate to severe. Aortic Valve: The aortic valve is tricuspid. Aortic valve regurgitation is mild. No aortic stenosis is present. Aortic valve mean gradient measures 1.3 mmHg. Aortic valve peak gradient measures 2.9 mmHg. Aortic valve area, by VTI measures 1.93 cm. Pulmonic Valve: The pulmonic valve was normal in structure. Pulmonic valve regurgitation is trivial. No evidence of pulmonic stenosis. Aorta: The aortic root is normal in size and structure. Venous: The inferior vena cava was not well visualized. IAS/Shunts: The interatrial septum was not well visualized. Agitated saline contrast was given  intravenously to evaluate for intracardiac shunting. Agitated saline contrast bubble study was negative, with no evidence of any interatrial shunt.  LEFT VENTRICLE PLAX 2D LVIDd:         5.30 cm      Diastology LVIDs:         4.50 cm      LV e' medial:    8.27 cm/s LV PW:         1.30 cm      LV E/e' medial:  11.0 LV IVS:        0.90 cm      LV e' lateral:   4.57 cm/s LVOT diam:     2.00 cm  LV E/e' lateral: 20.0 LV SV:         28 LV SV Index:   13           2D Longitudinal Strain LVOT Area:     3.14 cm     2D Strain GLS Avg:     -4.2 %  LV Volumes (MOD) LV vol d, MOD A2C: 153.0 ml LV vol d, MOD A4C: 158.0 ml LV vol s, MOD A2C: 112.0 ml LV vol s, MOD A4C: 123.0 ml LV SV MOD A2C:     41.0 ml LV SV MOD A4C:     158.0 ml LV SV MOD BP:      36.6 ml RIGHT VENTRICLE RV Basal diam:  4.45 cm RV Mid diam:    3.80 cm RV S prime:     9.03 cm/s TAPSE (M-mode): 1.6 cm LEFT ATRIUM             Index        RIGHT ATRIUM           Index LA diam:        4.10 cm 1.85 cm/m   RA Area:     17.40 cm LA Vol (A2C):   61.4 ml 27.76 ml/m  RA Volume:   46.70 ml  21.11 ml/m LA Vol (A4C):   60.9 ml 27.54 ml/m LA Biplane Vol: 65.7 ml 29.71 ml/m  AORTIC VALVE AV Area (Vmax):    2.00 cm AV Area (Vmean):   1.87 cm AV Area (VTI):     1.93 cm AV Vmax:           85.57 cm/s AV Vmean:          58.267 cm/s AV VTI:            0.145 m AV Peak Grad:      2.9 mmHg AV Mean Grad:      1.3 mmHg LVOT Vmax:         54.50 cm/s LVOT Vmean:        34.600 cm/s LVOT VTI:          0.089 m LVOT/AV VTI ratio: 0.62  AORTA Ao Root diam: 2.90 cm MITRAL VALVE               TRICUSPID VALVE MV Area (PHT): 5.23 cm    TR Peak grad:   36.7 mmHg MV Decel Time: 145 msec    TR Vmax:        303.00 cm/s MV E velocity: 91.20 cm/s MV A velocity: 35.60 cm/s  SHUNTS MV E/A ratio:  2.56        Systemic VTI:  0.09 m                            Systemic Diam: 2.00 cm Lonni Hanson MD Electronically signed by Lonni Hanson MD Signature Date/Time: 02/21/2024/7:52:23 PM     Final    CT ANGIO HEAD NECK W WO CM W PERF (CODE STROKE) Result Date: 02/20/2024 EXAM: CTA Head and Neck with Perfusion 02/20/2024 09:11:53 PM TECHNIQUE: CTA of the head and neck was performed with and without the administration of 100 mL of iohexol  (OMNIPAQUE ) 350 MG/ML injection. 3D postprocessing with multiplanar reconstructions and MIPs was performed to evaluate the vascular anatomy. Cerebral perfusion analysis using computed tomography with contrast administration, including post-processing of parametric maps with determination of cerebral blood flow, cerebral blood volume, mean transit time and time-to-maximum. Automated  exposure control, iterative reconstruction, and/or weight based adjustment of the mA/kV was utilized to reduce the radiation dose to as low as reasonably achievable. COMPARISON: 01/04/2024 CLINICAL HISTORY: Acute neuro deficit, stroke suspected, with left weakness and headache. Patient presented with stroke-like symptoms, including left-sided deficits, slow and slurred speech, headache, chest pain, and shortness of breath. History of stroke 4 days prior with left-sided deficits and pulmonary embolism (PE) without treatment, discharged 2 days ago. FINDINGS: CTA NECK: AORTIC ARCH AND ARCH VESSELS: No dissection or arterial injury. No significant stenosis of the brachiocephalic or subclavian arteries. CERVICAL CAROTID ARTERIES: Calcific atherosclerosis at both carotid bifurcations without hemodynamically significant stenosis by NASCET criteria. No dissection or arterial injury. CERVICAL VERTEBRAL ARTERIES: No dissection, arterial injury, or significant stenosis. LUNGS AND MEDIASTINUM: Unremarkable. SOFT TISSUES: Soft tissue asymmetry within the right nasopharynx recommend correlation with direct visualization. BONES: No acute abnormality. CTA HEAD: ANTERIOR CIRCULATION: No significant stenosis of the internal carotid arteries. No significant stenosis of the anterior cerebral arteries.  Occlusion of a right MCA M2 branch within the right insula. Normal left MCA. No aneurysm. POSTERIOR CIRCULATION: No significant stenosis of the posterior cerebral arteries. No significant stenosis of the basilar artery. No significant stenosis of the vertebral arteries. No aneurysm. OTHER: No dural venous sinus thrombosis on this non-dedicated study. CT PERFUSION: EXAM QUALITY: Exam quality is adequate with diagnostic perfusion maps. No significant motion artifact. Appropriate arterial inflow and venous outflow curves. CORE INFARCT (CBF<30% volume): 0 mL TOTAL HYPOPERFUSION (Tmax>6s volume): 42 mL PENUMBRA: Mismatch volume: 42 mL Mismatch ratio: Not applicable Location: Posterior left MCA territory IMPRESSION: 1. Right MCA M2 branch occlusion within the right insula. 2. CT perfusion demonstrates a 42 mL region of hypoperfusion without core infarct. 3. No perfusion abnormality at the site of hypodensity in the left frontal lobe, which is more likely chronic. 4. Calcific atherosclerosis at both carotid bifurcations without hemodynamically significant stenosis by NASCET criteria. 5. Right nasopharyngeal soft tissue asymmetry, recommend direct visualization. Findings communicated at 9:32 PM on 02/20/2024. Electronically signed by: Franky Stanford MD 02/20/2024 09:33 PM EDT RP Workstation: [id number]   CT HEAD CODE STROKE WO CONTRAST (LKW 0-4.5h, LVO 0-24h) Result Date: 02/20/2024 EXAM: CT HEAD WITHOUT CONTRAST 02/20/2024 08:54:32 PM TECHNIQUE: CT of the head was performed without the administration of intravenous contrast. Automated exposure control, iterative reconstruction, and/or weight based adjustment of the mA/kV was utilized to reduce the radiation dose to as low as reasonably achievable. COMPARISON: 01/04/2024 CLINICAL HISTORY: Neuro deficit, acute, stroke suspected. FINDINGS: BRAIN AND VENTRICLES: No acute hemorrhage. Subacute left frontal lobe infarct, with increased size of hypodense region compared to  01/04/24, which could indicate an early subacute infarct superimposed on the late subacute/early chronic infarct in this region. Chronic left cerebellar infarct is unchanged. No hydrocephalus. No extra-axial collection. No mass effect or midline shift. ASPECTS is 9. ORBITS: No acute abnormality. SINUSES: No acute abnormality. SOFT TISSUES AND SKULL: No acute soft tissue abnormality. No skull fracture. IMPRESSION: 1. Subacute left frontal lobe infarct, with increased size of the hypodense region compared to 01/04/24, possibly reflecting an early subacute component superimposed on a late subacute to early chronic infarct in this region. MRI recommended. 2. No acute intracranial hemorrhage. 3. Chronic left cerebellar infarct, unchanged. 4. ASPECTS is 9. 5. Findings communicated to Dr. Suzanne at 9:05 pm on 02/20/2024. Electronically signed by: Franky Stanford MD 02/20/2024 09:06 PM EDT RP Workstation: [id number]    Microbiology: Results for orders placed or performed during the hospital encounter of 02/20/24  MRSA Next Gen by PCR, Nasal     Status: None   Collection Time: 02/20/24 11:53 PM   Specimen: Nasal Mucosa; Nasal Swab  Result Value Ref Range Status   MRSA by PCR Next Gen NOT DETECTED NOT DETECTED Final    Comment: (NOTE) The GeneXpert MRSA Assay (FDA approved for NASAL specimens only), is one component of a comprehensive MRSA colonization surveillance program. It is not intended to diagnose MRSA infection nor to guide or monitor treatment for MRSA infections. Test performance is not FDA approved in patients less than 34 years old. Performed at [facility], [address] [place], [place] [address]     Labs: CBC: Recent Labs  Lab 02/20/24 2058 02/21/24 0438  WBC 5.5 4.6  HGB 11.8* 11.2*  HCT 39.5 36.8  MCV 95.0 93.2  PLT 310 262   Basic Metabolic Panel: Recent Labs  Lab 02/20/24 2058 02/21/24 0438  NA 137 135  K 4.4 4.0  CL 101 103  CO2 22 26  GLUCOSE 169* 195*  BUN  29* 26*  CREATININE 1.60* 1.45*  CALCIUM  8.1* 7.7*   Liver Function Tests: Recent Labs  Lab 02/20/24 2058  AST 23  ALT 16  ALKPHOS 77  BILITOT 0.5  PROT 6.3*  ALBUMIN  2.2*   CBG: Recent Labs  Lab 02/20/24 2049 02/20/24 2333  GLUCAP 165* 152*    Discharge time spent:  37 minutes.  Signed: Drue ONEIDA Potter, MD [facility] 02/23/2024

## 2024-02-28 ENCOUNTER — Telehealth: Payer: Self-pay | Admitting: Family

## 2024-02-28 NOTE — Telephone Encounter (Signed)
 Called to confirm/remind patient of their appointment [facility] on 02/29/24.   Appointment:   [] Confirmed  [] Left mess   [] No answer/No voice mail  [x] VM Full/unable to leave message  [] Phone not in service  Patient reminded to bring all medications and/or complete list.  Confirmed patient has transportation. Gave directions, instructed to utilize valet parking.

## 2024-02-29 ENCOUNTER — Telehealth: Payer: Self-pay | Admitting: Family

## 2024-02-29 ENCOUNTER — Encounter: Payer: MEDICAID | Admitting: Family

## 2024-02-29 NOTE — Telephone Encounter (Signed)
 Patient did not show for her [facility] appointment on 02/29/24.

## 2024-04-12 ENCOUNTER — Emergency Department
Admission: EM | Admit: 2024-04-12 | Discharge: 2024-04-12 | Disposition: A | Discharge status: Home / Self Care | Payer: MEDICAID | Attending: Emergency Medicine | Admitting: Emergency Medicine

## 2024-04-12 ENCOUNTER — Telehealth: Payer: Self-pay | Admitting: Family

## 2024-04-12 ENCOUNTER — Other Ambulatory Visit: Payer: Self-pay

## 2024-04-12 ENCOUNTER — Emergency Department: Payer: MEDICAID

## 2024-04-12 DIAGNOSIS — J811 Chronic pulmonary edema: Secondary | ICD-10-CM | POA: Diagnosis not present

## 2024-04-12 DIAGNOSIS — E119 Type 2 diabetes mellitus without complications: Secondary | ICD-10-CM | POA: Diagnosis not present

## 2024-04-12 DIAGNOSIS — I501 Left ventricular failure: Secondary | ICD-10-CM

## 2024-04-12 DIAGNOSIS — R0602 Shortness of breath: Secondary | ICD-10-CM

## 2024-04-12 DIAGNOSIS — I5040 Unspecified combined systolic (congestive) and diastolic (congestive) heart failure: Secondary | ICD-10-CM | POA: Insufficient documentation

## 2024-04-12 DIAGNOSIS — I509 Heart failure, unspecified: Secondary | ICD-10-CM

## 2024-04-12 HISTORY — DX: Cerebral infarction, unspecified: I63.9

## 2024-04-12 LAB — BASIC METABOLIC PANEL WITH GFR
Anion gap: 11 (ref 5–15)
BUN: 29 mg/dL — ABNORMAL HIGH (ref 6–20)
CO2: 25 mmol/L (ref 22–32)
Calcium: 7.3 mg/dL — ABNORMAL LOW (ref 8.9–10.3)
Chloride: 102 mmol/L (ref 98–111)
Creatinine, Ser: 1.62 mg/dL — ABNORMAL HIGH (ref 0.44–1.00)
GFR, Estimated: 41 mL/min — ABNORMAL LOW (ref >60)
Glucose, Bld: 258 mg/dL — ABNORMAL HIGH (ref 70–99)
Potassium: 3.9 mmol/L (ref 3.5–5.1)
Sodium: 139 mmol/L (ref 135–145)

## 2024-04-12 LAB — CBC
HCT: 36.4 % (ref 36.0–46.0)
Hemoglobin: 11.3 g/dL — ABNORMAL LOW (ref 12.0–15.0)
MCH: 28.9 pg (ref 26.0–34.0)
MCHC: 31 g/dL (ref 30.0–36.0)
MCV: 93.1 fL (ref 80.0–100.0)
Platelets: 287 K/uL (ref 150–400)
RBC: 3.91 MIL/uL (ref 3.87–5.11)
RDW: 15.1 % (ref 11.5–15.5)
WBC: 5.9 K/uL (ref 4.0–10.5)
nRBC: 0 % (ref 0.0–0.2)

## 2024-04-12 MED ORDER — SPIRONOLACTONE 25 MG PO TABS
25.0000 mg | ORAL_TABLET | Freq: Every day | ORAL | 1 refills | Status: DC
  Filled 2024-04-12: qty 90, 90d supply, fill #0

## 2024-04-12 NOTE — Telephone Encounter (Signed)
 Spoke to pt and childhood friend. Pt is not weighing daily. No checking bp's. States her legs are very swollen and she is very short of breath. Speaking with the pt, she is unable to complete sentences without stopping for a breath. She is unable to lay flat due to shortness of breath. Advised that she needs to report to ED. Pt is currently is homeless and currently living with her childhood friend and sleeping on the couch. Friend Edwardo Sharps) would like to be involved to help pt with care.

## 2024-04-12 NOTE — Discharge Instructions (Addendum)
 Cut down on your fluid and salt intake daily Restart spironolactone  25mg  Daily

## 2024-04-12 NOTE — ED Triage Notes (Signed)
 Pt arrives via POV with c/o SOB stating that their fluid is backing up and it's been going on for about a week. Pt states that they are on a ton medication and they have been taking it as prescribed. Pt has a Hx of CHF, HF, 2 strokes, DM. Pt is A&Ox4 and ambulatory during triage.

## 2024-04-12 NOTE — ED Provider Notes (Signed)
 [facility] Provider Note   Event Date/Time   First MD Initiated Contact with Patient 04/12/24 2149     (approximate) History  Shortness of Breath  HPI Molly Howard is a 38 y.o. female with a past medical history of of combined systolic and diastolic heart failure, uncontrolled type 2 diabetes, previous CVA, and polysubstance abuse who presents complaining of of worsening shortness of breath and fluid retention.  Patient states that she feels the same way she has in the past when she has had increased fluid on her chest.  Patient states that she spoke to [facility] in the heart failure clinic and explained her worsening shortness of breath as well as right knee pain and was told to present to the emergency department.  Patient has recently stopped her spironolactone .  Patient does not keep track of her daily fluid intake, salt intake, or take daily weights. ROS: Patient currently denies any vision changes, tinnitus, difficulty speaking, facial droop, sore throat, chest pain, abdominal pain, nausea/vomiting/diarrhea, dysuria, or weakness/numbness/paresthesias in any extremity   Physical Exam  Triage Vital Signs: ED Triage Vitals  Encounter Vitals Group     BP 04/12/24 1645 (!) 138/91     Girls Systolic BP Percentile --      Girls Diastolic BP Percentile --      Boys Systolic BP Percentile --      Boys Diastolic BP Percentile --      Pulse Rate 04/12/24 1645 80     Resp 04/12/24 1645 18     Temp 04/12/24 1645 98.2 F (36.8 C)     Temp Source 04/12/24 1645 Oral     SpO2 04/12/24 1645 97 %     Weight 04/12/24 1647 250 lb (113.4 kg)     Height 04/12/24 1647 5' 7 (1.702 m)     Head Circumference --      Peak Flow --      Pain Score 04/12/24 1645 9     Pain Loc --      Pain Education --      Exclude from Growth Chart --    Most recent vital signs: Vitals:   04/12/24 1645 04/12/24 2210  BP: (!) 138/91 (!) 159/89  Pulse: 80 87  Resp: 18 16  Temp: 98.2 F  (36.8 C)   SpO2: 97% 98%   General: Awake, oriented x4. CV:  Good peripheral perfusion. Resp:  Normal effort. Abd:  No distention. Other:  Middle-aged obese African-American female resting comfortably in no acute distress ED Results / Procedures / Treatments  Labs (all labs ordered are listed, but only abnormal results are displayed) Labs Reviewed  BASIC METABOLIC PANEL WITH GFR - Abnormal; Notable for the following components:      Result Value   Glucose, Bld 258 (*)    BUN 29 (*)    Creatinine, Ser 1.62 (*)    Calcium  7.3 (*)    GFR, Estimated 41 (*)    All other components within normal limits  CBC - Abnormal; Notable for the following components:   Hemoglobin 11.3 (*)    All other components within normal limits  POC URINE PREG, ED   EKG ED ECG REPORT I, Artist MARLA Kerns, the attending physician, personally viewed and interpreted this ECG. Date: 04/12/2024 EKG Time: 1647 Rate: 82 Rhythm: normal sinus rhythm QRS Axis: normal Intervals: normal ST/T Wave abnormalities: normal Narrative Interpretation: no evidence of acute ischemia RADIOLOGY ED MD interpretation: 2 view chest x-ray  shows cardiomegaly with mild central vascular congestion - All radiology independently interpreted and agree with radiology assessment Official radiology report(s): DG Chest 2 View Result Date: 04/12/2024 CLINICAL DATA:  Shortness of breath. EXAM: DG CHEST 2V COMPARISON:  Chest radiograph dated 01/04/2024. FINDINGS: There is cardiomegaly with mild central vascular congestion. No focal consolidation, pleural effusion, pneumothorax. Single lead AICD device. No acute osseous pathology. IMPRESSION: Cardiomegaly with mild central vascular congestion. Electronically Signed   By: Vanetta Chou M.D.   On: 04/12/2024 17:25   PROCEDURES: Critical Care performed: No Procedures MEDICATIONS ORDERED IN ED: Medications - No data to display IMPRESSION / MDM / ASSESSMENT AND PLAN / ED COURSE  I reviewed  the triage vital signs and the nursing notes.                             The patient is on the cardiac monitor to evaluate for evidence of arrhythmia and/or significant heart rate changes. Patient's presentation is most consistent with acute presentation with potential threat to life or bodily function. Patient is a 38 year old female with the above-stated past medical history presents complaining of worsening shortness of breath DDx: CHF exacerbation, ACS, arrhythmia, pulmonary edema Plan: CBC, BMP, chest x-ray, EKG  EKG shows no signs of ischemia, CBC and BMP only showed mild creatinine elevation at 1.62 that is at patient's baseline.  Chest x-ray shows cardiomegaly with mild central vascular congestion.  Patient is not in respiratory distress at this time and therefore is stable for discharge with outpatient heart failure clinic follow-up.  Patient was encouraged to restart her spironolactone  25 mg daily.  Patient expressed understanding and agrees with plan for discharge at this time with outpatient follow-up.  Patient given strict return precautions and all questions answered prior to discharge  Dispo: Discharge home with cardiology follow-up   FINAL CLINICAL IMPRESSION(S) / ED DIAGNOSES   Final diagnoses:  Congestive heart failure, unspecified HF chronicity, unspecified heart failure type (HCC)  Shortness of breath  Pulmonary edema cardiac cause (HCC)   Rx / DC Orders   ED Discharge Orders          Ordered    spironolactone  (ALDACTONE ) 25 MG tablet  Daily        04/12/24 2208           Note:  This document was prepared using Dragon voice recognition software and may include unintentional dictation errors.   Jossie Artist POUR, MD 04/12/24 [phone]

## 2024-04-13 ENCOUNTER — Other Ambulatory Visit: Payer: Self-pay

## 2024-04-13 ENCOUNTER — Ambulatory Visit: Payer: MEDICAID | Admitting: Family

## 2024-04-18 ENCOUNTER — Telehealth: Payer: Self-pay | Admitting: Family

## 2024-04-18 NOTE — Telephone Encounter (Signed)
 Called to confirm/remind patient of their appointment at the Advanced [facility] Failure Clinic on 04/19/24.   Appointment:   [] Confirmed  [] Left mess   [x] No answer/No voice mail  [] VM Full/unable to leave message  [] Phone not in service  Patient reminded to bring all medications and/or complete list.  Confirmed patient has transportation. Gave directions, instructed to utilize valet parking.
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
## 2024-04-19 ENCOUNTER — Other Ambulatory Visit: Payer: Self-pay | Admitting: Family

## 2024-04-19 ENCOUNTER — Ambulatory Visit: Payer: MEDICAID | Attending: Family | Admitting: Family

## 2024-04-19 ENCOUNTER — Encounter: Payer: Self-pay | Admitting: Family

## 2024-04-19 ENCOUNTER — Other Ambulatory Visit: Payer: Self-pay

## 2024-04-19 ENCOUNTER — Other Ambulatory Visit (HOSPITAL_COMMUNITY): Payer: Self-pay

## 2024-04-19 VITALS — BP 137/101 | HR 72 | Wt 260.2 lb

## 2024-04-19 DIAGNOSIS — I472 Ventricular tachycardia, unspecified: Secondary | ICD-10-CM | POA: Diagnosis not present

## 2024-04-19 DIAGNOSIS — F1910 Other psychoactive substance abuse, uncomplicated: Secondary | ICD-10-CM

## 2024-04-19 DIAGNOSIS — Z95810 Presence of automatic (implantable) cardiac defibrillator: Secondary | ICD-10-CM | POA: Insufficient documentation

## 2024-04-19 DIAGNOSIS — I5022 Chronic systolic (congestive) heart failure: Secondary | ICD-10-CM | POA: Insufficient documentation

## 2024-04-19 DIAGNOSIS — Z87891 Personal history of nicotine dependence: Secondary | ICD-10-CM | POA: Diagnosis not present

## 2024-04-19 DIAGNOSIS — I471 Supraventricular tachycardia, unspecified: Secondary | ICD-10-CM | POA: Insufficient documentation

## 2024-04-19 DIAGNOSIS — E1122 Type 2 diabetes mellitus with diabetic chronic kidney disease: Secondary | ICD-10-CM | POA: Diagnosis not present

## 2024-04-19 DIAGNOSIS — I130 Hypertensive heart and chronic kidney disease with heart failure and stage 1 through stage 4 chronic kidney disease, or unspecified chronic kidney disease: Secondary | ICD-10-CM | POA: Insufficient documentation

## 2024-04-19 DIAGNOSIS — D649 Anemia, unspecified: Secondary | ICD-10-CM | POA: Insufficient documentation

## 2024-04-19 DIAGNOSIS — D509 Iron deficiency anemia, unspecified: Secondary | ICD-10-CM | POA: Diagnosis not present

## 2024-04-19 DIAGNOSIS — N182 Chronic kidney disease, stage 2 (mild): Secondary | ICD-10-CM | POA: Diagnosis not present

## 2024-04-19 DIAGNOSIS — Z8673 Personal history of transient ischemic attack (TIA), and cerebral infarction without residual deficits: Secondary | ICD-10-CM | POA: Diagnosis not present

## 2024-04-19 DIAGNOSIS — N1831 Chronic kidney disease, stage 3a: Secondary | ICD-10-CM | POA: Diagnosis not present

## 2024-04-19 DIAGNOSIS — I428 Other cardiomyopathies: Secondary | ICD-10-CM | POA: Diagnosis not present

## 2024-04-19 DIAGNOSIS — I63319 Cerebral infarction due to thrombosis of unspecified middle cerebral artery: Secondary | ICD-10-CM | POA: Diagnosis not present

## 2024-04-19 DIAGNOSIS — I071 Rheumatic tricuspid insufficiency: Secondary | ICD-10-CM | POA: Diagnosis not present

## 2024-04-19 DIAGNOSIS — I5020 Unspecified systolic (congestive) heart failure: Secondary | ICD-10-CM

## 2024-04-19 DIAGNOSIS — Z7982 Long term (current) use of aspirin: Secondary | ICD-10-CM | POA: Insufficient documentation

## 2024-04-19 DIAGNOSIS — I10 Essential (primary) hypertension: Secondary | ICD-10-CM

## 2024-04-19 DIAGNOSIS — Z833 Family history of diabetes mellitus: Secondary | ICD-10-CM | POA: Diagnosis not present

## 2024-04-19 DIAGNOSIS — Z5900 Homelessness unspecified: Secondary | ICD-10-CM | POA: Diagnosis not present

## 2024-04-19 DIAGNOSIS — Z8249 Family history of ischemic heart disease and other diseases of the circulatory system: Secondary | ICD-10-CM | POA: Insufficient documentation

## 2024-04-19 DIAGNOSIS — I5082 Biventricular heart failure: Secondary | ICD-10-CM | POA: Insufficient documentation

## 2024-04-19 DIAGNOSIS — Z7984 Long term (current) use of oral hypoglycemic drugs: Secondary | ICD-10-CM | POA: Diagnosis not present

## 2024-04-19 DIAGNOSIS — Z79899 Other long term (current) drug therapy: Secondary | ICD-10-CM | POA: Diagnosis not present

## 2024-04-19 MED ORDER — SPIRONOLACTONE 25 MG PO TABS: 25.0000 mg | ORAL_TABLET | Freq: Every day | ORAL | 1 refills | Status: DC

## 2024-04-19 MED ORDER — CLOPIDOGREL BISULFATE 75 MG PO TABS
75.0000 mg | ORAL_TABLET | Freq: Every day | ORAL | 1 refills | Status: DC
  Filled 2024-04-19: qty 90, 90d supply, fill #0

## 2024-04-19 MED ORDER — DIGOXIN 125 MCG PO TABS
0.0625 mg | ORAL_TABLET | Freq: Every day | ORAL | 1 refills | Status: DC
  Filled 2024-04-19: qty 45, 90d supply, fill #0

## 2024-04-19 MED ORDER — CARVEDILOL 12.5 MG PO TABS
12.5000 mg | ORAL_TABLET | Freq: Two times a day (BID) | ORAL | 1 refills | Status: DC
  Filled 2024-04-19: qty 180, 90d supply, fill #0

## 2024-04-19 MED ORDER — FUROSCIX 80 MG/10ML ~~LOC~~ CTKT
80.0000 mg | CARTRIDGE | SUBCUTANEOUS | 1 refills | Status: DC
  Filled 2024-04-20: qty 5, fill #0

## 2024-04-19 NOTE — Progress Notes (Signed)
 Medication Samples have been provided to the patient.  Drug name: furoscix        Strength: 80mg         Qty: 1 kit  LOT: [id number]  Exp.Date: 08/30/2025  Dosing instructions: Take 80mg  daily as directed  The patient has been instructed regarding the correct time, dose, and frequency of taking this medication, including desired effects and most common side effects.   Molly Howard 10:47 AM 04/19/2024

## 2024-04-19 NOTE — Patient Instructions (Signed)
 Medication Changes: RESTART Spironolactone  25mg  daily  RESTART Digoxin  0.0625mg  daily  REFILLED Clopidogrel  75mg  daily  PLEASE REPORT to [facility] for IV lasix  tomorrow at 04/20/24 1pm.  DO NOT TAKE torsemide  today or tomorrow. Please restart Torsemide  on Saturday morning.   Lab Work:  Lab work will be done tomorrow at the IV lasix  appointment.     Follow-Up in: Please follow up with the Advanced Heart Failure Clinic on Monday.   Thank you for choosing [facility].    At the Advanced Heart Failure Clinic, you and your health needs are our priority. We have a designated team specialized in the treatment of Heart Failure. This Care Team includes your primary Heart Failure Specialized Cardiologist (physician), Advanced Practice Providers (APPs- Physician Assistants and Nurse Practitioners), and Pharmacist who all work together to provide you with the care you need, when you need it.   You may see any of the following providers on your designated Care Team at your next follow up:  Dr. Toribio Fuel Dr. Ezra Shuck Dr. Ria Commander Dr. Morene Brownie [name], FNP Jaun Bash, RPH-CPP  Please be sure to bring in all your medications bottles to every appointment.   Need to Contact Us :  If you have any questions or concerns before your next appointment please send us  a message through [facility] or call our office at [phone].    TO LEAVE A MESSAGE FOR THE NURSE SELECT OPTION 2, PLEASE LEAVE A MESSAGE INCLUDING: YOUR NAME DATE OF BIRTH CALL BACK NUMBER REASON FOR CALL**this is important as we prioritize the call backs  YOU WILL RECEIVE A CALL BACK THE SAME DAY AS LONG AS YOU CALL BEFORE 4:00 PM

## 2024-04-19 NOTE — Progress Notes (Signed)
 ReDS Vest / Clip - 04/19/24 0911       ReDS Vest / Clip   Station Marker C    Ruler Value 32    ReDS Value Range High volume overload    ReDS Actual Value 55

## 2024-04-20 ENCOUNTER — Telehealth: Payer: Self-pay | Admitting: Family

## 2024-04-20 ENCOUNTER — Ambulatory Visit
Admission: RE | Admit: 2024-04-20 | Discharge: 2024-04-20 | Disposition: A | Discharge status: Home / Self Care | Payer: MEDICAID | Source: Ambulatory Visit | Attending: Family | Admitting: Family

## 2024-04-20 ENCOUNTER — Other Ambulatory Visit: Payer: Self-pay

## 2024-04-20 ENCOUNTER — Telehealth (HOSPITAL_COMMUNITY): Payer: Self-pay

## 2024-04-20 DIAGNOSIS — I5020 Unspecified systolic (congestive) heart failure: Secondary | ICD-10-CM | POA: Diagnosis present

## 2024-04-20 LAB — BASIC METABOLIC PANEL WITH GFR
Anion gap: 10 (ref 5–15)
BUN: 24 mg/dL — ABNORMAL HIGH (ref 6–20)
CO2: 28 mmol/L (ref 22–32)
Calcium: 6.9 mg/dL — ABNORMAL LOW (ref 8.9–10.3)
Chloride: 99 mmol/L (ref 98–111)
Creatinine, Ser: 1.34 mg/dL — ABNORMAL HIGH (ref 0.44–1.00)
GFR, Estimated: 51 mL/min — ABNORMAL LOW
Glucose, Bld: 373 mg/dL — ABNORMAL HIGH (ref 70–99)
Potassium: 3.6 mmol/L (ref 3.5–5.1)
Sodium: 137 mmol/L (ref 135–145)

## 2024-04-20 LAB — PRO BRAIN NATRIURETIC PEPTIDE: Pro Brain Natriuretic Peptide: 6696 pg/mL — ABNORMAL HIGH

## 2024-04-20 MED ORDER — FUROSEMIDE 10 MG/ML IJ SOLN
80.0000 mg | Freq: Once | INTRAMUSCULAR | Status: AC
  Administered 2024-04-20: 80 mg via INTRAVENOUS

## 2024-04-20 MED ORDER — POTASSIUM CHLORIDE CRYS ER 20 MEQ PO TBCR: 40.0000 meq | EXTENDED_RELEASE_TABLET | Freq: Once | ORAL | Status: DC

## 2024-04-20 MED ORDER — FUROSEMIDE 10 MG/ML IJ SOLN
INTRAMUSCULAR | Status: AC
  Filled 2024-04-20: qty 8

## 2024-04-20 NOTE — Telephone Encounter (Signed)
 Advanced Heart Failure Patient Advocate Encounter  Test billing for this patient's current coverage ([employer]) returns a $4 copay for 5 day supply of Furoscix .  This test claim was processed through  [facility]- copay amounts may vary at other pharmacies due to pharmacy/plan contracts, or as the patient moves through the different stages of their insurance plan.  Rachel DEL, CPhT Rx Patient Advocate Phone: [phone]

## 2024-04-20 NOTE — Telephone Encounter (Signed)
 Called to confirm/remind patient of their appointment at the Advanced Heart Failure Clinic on 04/23/24.   Appointment:   [x] Confirmed  [] Left mess   [] No answer/No voice mail  [] VM Full/unable to leave message  [] Phone not in service  Patient reminded to bring all medications and/or complete list.  Confirmed patient has transportation. Gave directions, instructed to utilize valet parking.

## 2024-04-20 NOTE — Progress Notes (Signed)
 Pt arrived for lasix  and potassium. Pt refused potassium. [name] NP notified. Ok'd to go ahead and give Lasix  without the potassium.

## 2024-04-23 ENCOUNTER — Ambulatory Visit: Payer: MEDICAID
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
## 2024-05-14 ENCOUNTER — Inpatient Hospital Stay
Admission: EM | Admit: 2024-05-14 | Discharge: 2024-05-23 | DRG: 291 | Disposition: A | Discharge status: Home / Self Care | Payer: MEDICAID | Attending: Internal Medicine | Admitting: Internal Medicine

## 2024-05-14 ENCOUNTER — Emergency Department: Payer: MEDICAID

## 2024-05-14 ENCOUNTER — Other Ambulatory Visit: Payer: Self-pay

## 2024-05-14 ENCOUNTER — Encounter: Payer: Self-pay | Admitting: Emergency Medicine

## 2024-05-14 DIAGNOSIS — I272 Pulmonary hypertension, unspecified: Secondary | ICD-10-CM | POA: Diagnosis present

## 2024-05-14 DIAGNOSIS — Z7984 Long term (current) use of oral hypoglycemic drugs: Secondary | ICD-10-CM

## 2024-05-14 DIAGNOSIS — E669 Obesity, unspecified: Secondary | ICD-10-CM | POA: Diagnosis present

## 2024-05-14 DIAGNOSIS — Z8673 Personal history of transient ischemic attack (TIA), and cerebral infarction without residual deficits: Secondary | ICD-10-CM

## 2024-05-14 DIAGNOSIS — Z59868 Other specified financial insecurity: Secondary | ICD-10-CM

## 2024-05-14 DIAGNOSIS — M79671 Pain in right foot: Secondary | ICD-10-CM | POA: Diagnosis present

## 2024-05-14 DIAGNOSIS — Z7901 Long term (current) use of anticoagulants: Secondary | ICD-10-CM

## 2024-05-14 DIAGNOSIS — R0602 Shortness of breath: Secondary | ICD-10-CM

## 2024-05-14 DIAGNOSIS — N1831 Chronic kidney disease, stage 3a: Secondary | ICD-10-CM | POA: Diagnosis present

## 2024-05-14 DIAGNOSIS — I493 Ventricular premature depolarization: Secondary | ICD-10-CM | POA: Diagnosis present

## 2024-05-14 DIAGNOSIS — Z8249 Family history of ischemic heart disease and other diseases of the circulatory system: Secondary | ICD-10-CM

## 2024-05-14 DIAGNOSIS — I160 Hypertensive urgency: Secondary | ICD-10-CM | POA: Diagnosis present

## 2024-05-14 DIAGNOSIS — Z95810 Presence of automatic (implantable) cardiac defibrillator: Secondary | ICD-10-CM

## 2024-05-14 DIAGNOSIS — I130 Hypertensive heart and chronic kidney disease with heart failure and stage 1 through stage 4 chronic kidney disease, or unspecified chronic kidney disease: Principal | ICD-10-CM | POA: Diagnosis present

## 2024-05-14 DIAGNOSIS — I2489 Other forms of acute ischemic heart disease: Secondary | ICD-10-CM | POA: Diagnosis present

## 2024-05-14 DIAGNOSIS — F32A Depression, unspecified: Secondary | ICD-10-CM | POA: Diagnosis present

## 2024-05-14 DIAGNOSIS — Z833 Family history of diabetes mellitus: Secondary | ICD-10-CM

## 2024-05-14 DIAGNOSIS — I428 Other cardiomyopathies: Secondary | ICD-10-CM | POA: Diagnosis present

## 2024-05-14 DIAGNOSIS — I5082 Biventricular heart failure: Secondary | ICD-10-CM | POA: Diagnosis present

## 2024-05-14 DIAGNOSIS — Z9109 Other allergy status, other than to drugs and biological substances: Secondary | ICD-10-CM

## 2024-05-14 DIAGNOSIS — I5023 Acute on chronic systolic (congestive) heart failure: Secondary | ICD-10-CM | POA: Diagnosis present

## 2024-05-14 DIAGNOSIS — Z7902 Long term (current) use of antithrombotics/antiplatelets: Secondary | ICD-10-CM

## 2024-05-14 DIAGNOSIS — K219 Gastro-esophageal reflux disease without esophagitis: Secondary | ICD-10-CM | POA: Diagnosis present

## 2024-05-14 DIAGNOSIS — I509 Heart failure, unspecified: Secondary | ICD-10-CM

## 2024-05-14 DIAGNOSIS — R059 Cough, unspecified: Secondary | ICD-10-CM | POA: Diagnosis present

## 2024-05-14 DIAGNOSIS — J810 Acute pulmonary edema: Principal | ICD-10-CM

## 2024-05-14 DIAGNOSIS — Z87891 Personal history of nicotine dependence: Secondary | ICD-10-CM

## 2024-05-14 DIAGNOSIS — F1910 Other psychoactive substance abuse, uncomplicated: Secondary | ICD-10-CM | POA: Diagnosis present

## 2024-05-14 DIAGNOSIS — Z7985 Long-term (current) use of injectable non-insulin antidiabetic drugs: Secondary | ICD-10-CM

## 2024-05-14 DIAGNOSIS — F1410 Cocaine abuse, uncomplicated: Secondary | ICD-10-CM | POA: Diagnosis present

## 2024-05-14 DIAGNOSIS — E785 Hyperlipidemia, unspecified: Secondary | ICD-10-CM | POA: Diagnosis present

## 2024-05-14 DIAGNOSIS — Z6836 Body mass index (BMI) 36.0-36.9, adult: Secondary | ICD-10-CM

## 2024-05-14 DIAGNOSIS — I2510 Atherosclerotic heart disease of native coronary artery without angina pectoris: Secondary | ICD-10-CM | POA: Diagnosis present

## 2024-05-14 DIAGNOSIS — I5043 Acute on chronic combined systolic (congestive) and diastolic (congestive) heart failure: Secondary | ICD-10-CM

## 2024-05-14 DIAGNOSIS — Z91148 Patient's other noncompliance with medication regimen for other reason: Secondary | ICD-10-CM

## 2024-05-14 DIAGNOSIS — E1122 Type 2 diabetes mellitus with diabetic chronic kidney disease: Secondary | ICD-10-CM | POA: Diagnosis present

## 2024-05-14 DIAGNOSIS — Z5982 Transportation insecurity: Secondary | ICD-10-CM

## 2024-05-14 DIAGNOSIS — Z91141 Patient's other noncompliance with medication regimen due to financial hardship: Secondary | ICD-10-CM

## 2024-05-14 DIAGNOSIS — Z7982 Long term (current) use of aspirin: Secondary | ICD-10-CM

## 2024-05-14 DIAGNOSIS — Z79899 Other long term (current) drug therapy: Secondary | ICD-10-CM

## 2024-05-14 DIAGNOSIS — I071 Rheumatic tricuspid insufficiency: Secondary | ICD-10-CM | POA: Diagnosis present

## 2024-05-14 DIAGNOSIS — I491 Atrial premature depolarization: Secondary | ICD-10-CM | POA: Diagnosis present

## 2024-05-14 LAB — CBC
HCT: 37.5 % (ref 36.0–46.0)
Hemoglobin: 11.6 g/dL — ABNORMAL LOW (ref 12.0–15.0)
MCH: 28.9 pg (ref 26.0–34.0)
MCHC: 30.9 g/dL (ref 30.0–36.0)
MCV: 93.5 fL (ref 80.0–100.0)
Platelets: 303 K/uL (ref 150–400)
RBC: 4.01 MIL/uL (ref 3.87–5.11)
RDW: 15.1 % (ref 11.5–15.5)
WBC: 6.8 K/uL (ref 4.0–10.5)
nRBC: 0 % (ref 0.0–0.2)

## 2024-05-14 LAB — BASIC METABOLIC PANEL WITH GFR
Anion gap: 11 (ref 5–15)
BUN: 21 mg/dL — ABNORMAL HIGH (ref 6–20)
CO2: 27 mmol/L (ref 22–32)
Calcium: 6.8 mg/dL — ABNORMAL LOW (ref 8.9–10.3)
Chloride: 100 mmol/L (ref 98–111)
Creatinine, Ser: 1.28 mg/dL — ABNORMAL HIGH (ref 0.44–1.00)
GFR, Estimated: 54 mL/min — ABNORMAL LOW
Glucose, Bld: 245 mg/dL — ABNORMAL HIGH (ref 70–99)
Potassium: 3.6 mmol/L (ref 3.5–5.1)
Sodium: 138 mmol/L (ref 135–145)

## 2024-05-14 LAB — PRO BRAIN NATRIURETIC PEPTIDE: Pro Brain Natriuretic Peptide: 8096 pg/mL — ABNORMAL HIGH

## 2024-05-14 LAB — TROPONIN T, HIGH SENSITIVITY: Troponin T High Sensitivity: 67 ng/L — ABNORMAL HIGH (ref 0–19)

## 2024-05-14 MED ORDER — FUROSEMIDE 10 MG/ML IJ SOLN
120.0000 mg | Freq: Once | INTRAVENOUS | Status: AC
  Administered 2024-05-15: 120 mg via INTRAVENOUS
  Filled 2024-05-14 (×2): qty 12

## 2024-05-14 NOTE — ED Notes (Signed)
 Recollected trop and sent to the lab

## 2024-05-14 NOTE — ED Provider Notes (Signed)
 Molly Howard Provider Note    Event Date/Time   First MD Initiated Contact with Patient 05/14/24 2149     (approximate)   History   Shortness of Breath and Foot Pain   HPI  Molly Howard is a 39 y.o. female history of CHF, history of AICD, CKD, diabetes, hypertension, presenting with shortness of breath as well as right foot pain.  States has been ongoing for the last couple days, denies any trauma.  No redness to the foot, no focal weakness or numbness.  She denies any chest pain, has a mild cough.  Patient states that she has been compliant with her medications.  No fever.  No unilateral calf swelling or tenderness.  On independent chart review, she was seen by cardiology in December, has history of chronic heart failure thought secondary to prior cocaine use and uncontrolled hypertension, is on torsemide  60 mg daily.  Had an echo done in 2025 in April that showed an EF of 30 to 35%.     Physical Exam   Triage Vital Signs: ED Triage Vitals  Encounter Vitals Group     BP 05/14/24 1856 (!) 174/116     Girls Systolic BP Percentile --      Girls Diastolic BP Percentile --      Boys Systolic BP Percentile --      Boys Diastolic BP Percentile --      Pulse Rate 05/14/24 1856 75     Resp 05/14/24 1856 19     Temp 05/14/24 1856 98 F (36.7 C)     Temp Source 05/14/24 1856 Oral     SpO2 05/14/24 1856 96 %     Weight 05/14/24 1858 260 lb (117.9 kg)     Height 05/14/24 1858 5' 7 (1.702 m)     Head Circumference --      Peak Flow --      Pain Score 05/14/24 1914 10     Pain Loc --      Pain Education --      Exclude from Growth Chart --     Most recent vital signs: Vitals:   05/14/24 1919 05/14/24 2231  BP: (!) 167/89 (!) 157/107  Pulse: 74 73  Resp:  (!) 21  Temp:  98 F (36.7 C)  SpO2:  99%     General: Awake, no distress.  CV:  Good peripheral perfusion.  Resp:  Normal effort.  No tachypnea or respiratory distress Abd:  No distention.   Soft nontender Other:  Bilateral lower extremity edema that appears symmetrical, she has some tenderness to her distal dorsal right foot without overlying erythema, no palpable fluctuance, no open wounds.  Able to move her toes and ankles.   ED Results / Procedures / Treatments   Labs (all labs ordered are listed, but only abnormal results are displayed) Labs Reviewed  BASIC METABOLIC PANEL WITH GFR - Abnormal; Notable for the following components:      Result Value   Glucose, Bld 245 (*)    BUN 21 (*)    Creatinine, Ser 1.28 (*)    Calcium  6.8 (*)    GFR, Estimated 54 (*)    All other components within normal limits  CBC - Abnormal; Notable for the following components:   Hemoglobin 11.6 (*)    All other components within normal limits  PRO BRAIN NATRIURETIC PEPTIDE - Abnormal; Notable for the following components:   Pro Brain Natriuretic Peptide 8,096.0 (*)  All other components within normal limits  TROPONIN T, HIGH SENSITIVITY - Abnormal; Notable for the following components:   Troponin T High Sensitivity 67 (*)    All other components within normal limits  POC URINE PREG, ED  TROPONIN T, HIGH SENSITIVITY     EKG  EKG shows, sinus rhythm with premature supraventricular complexes, rate 74, normal QS, normal QTc, no obvious ischemic ST elevation, T wave flattening in aVL, baseline is wandering due to patient movement, not significantly changed compared to prior   RADIOLOGY On my independent interpretation, chest x-ray does show pulmonary edema   PROCEDURES:  Critical Care performed: No  Procedures   MEDICATIONS ORDERED IN ED: Medications  furosemide  (LASIX ) 120 mg in dextrose  5 % 50 mL IVPB (has no administration in time range)     IMPRESSION / MDM / ASSESSMENT AND PLAN / ED COURSE  I reviewed the triage vital signs and the nursing notes.                              Differential diagnosis includes, but is not limited to, CHF exacerbation, volume  overload, for her foot pain, suspect it could be related to her peripheral edema, there is no overlying skin changes other than the edema, doubt infection, did consider arthritis, strain, sprain, fracture.  Labs, EKG, troponin, chest x-ray, x-ray foot.  Will give her dose of Lasix  here.  Patient's presentation is most consistent with acute presentation with potential threat to life or bodily function.  Independent interpretation of labs and imaging below.  Patient signed out pending repeat troponin, stable or downtrending, likely will discharge with outpatient cardiology follow-up.    Clinical Course as of 05/14/24 2340  Mon May 14, 2024  2217 Independent review of labs, x-rays not severely deranged, creatinine is mildly elevated but downtrending compared to prior, no leukocytosis, BNP is elevated, troponin is mildly elevated. [TT]  2218 DG Foot Complete Right 1. No acute findings.  [TT]  2218 DG Chest 2 View IMPRESSION: 1. Pulmonary edema and increased interstitial markings. 2. Cardiomegaly with cardiac defibrillator in place.   [TT]    Clinical Course User Index [TT] Waymond, Lorelle Cummins, MD     FINAL CLINICAL IMPRESSION(S) / ED DIAGNOSES   Final diagnoses:  Right foot pain  Acute pulmonary edema (HCC)  Shortness of breath     Rx / DC Orders   ED Discharge Orders     None        Note:  This document was prepared using Dragon voice recognition software and may include unintentional dictation errors.    Waymond Lorelle Cummins, MD 05/14/24 [phone]

## 2024-05-14 NOTE — ED Provider Notes (Signed)
 11:37 PM  Assumed care at shift change.  Pt with h/o CHF with SOB.  CXR shows pulm edema.  First troponin 67.  Second pending.  12:29 AM  Pt's second troponin is downtrending.  On reassessment, patient states she is feeling very poorly and is requesting admission to the hospital for IV diuresis.  Will discuss with the hospitalist.   Clarene Curran, Josette SAILOR, DO 05/15/24 9970

## 2024-05-14 NOTE — ED Triage Notes (Signed)
 Pt presents to the ED via POV with complaints of R foot pain and SOB. Hx of CHF -compliant with diuretics. Endorses 10/10 foot pain without radiation. No injury nor fall noted to cause the pain. A&Ox4 at this time.

## 2024-05-15 DIAGNOSIS — F1910 Other psychoactive substance abuse, uncomplicated: Secondary | ICD-10-CM | POA: Diagnosis present

## 2024-05-15 DIAGNOSIS — Z8673 Personal history of transient ischemic attack (TIA), and cerebral infarction without residual deficits: Secondary | ICD-10-CM | POA: Diagnosis not present

## 2024-05-15 DIAGNOSIS — I160 Hypertensive urgency: Secondary | ICD-10-CM | POA: Diagnosis present

## 2024-05-15 DIAGNOSIS — R7989 Other specified abnormal findings of blood chemistry: Secondary | ICD-10-CM

## 2024-05-15 DIAGNOSIS — Z7984 Long term (current) use of oral hypoglycemic drugs: Secondary | ICD-10-CM | POA: Diagnosis not present

## 2024-05-15 DIAGNOSIS — I25118 Atherosclerotic heart disease of native coronary artery with other forms of angina pectoris: Secondary | ICD-10-CM | POA: Diagnosis not present

## 2024-05-15 DIAGNOSIS — Z7902 Long term (current) use of antithrombotics/antiplatelets: Secondary | ICD-10-CM | POA: Diagnosis not present

## 2024-05-15 DIAGNOSIS — I5082 Biventricular heart failure: Secondary | ICD-10-CM | POA: Diagnosis present

## 2024-05-15 DIAGNOSIS — I130 Hypertensive heart and chronic kidney disease with heart failure and stage 1 through stage 4 chronic kidney disease, or unspecified chronic kidney disease: Secondary | ICD-10-CM | POA: Diagnosis present

## 2024-05-15 DIAGNOSIS — I509 Heart failure, unspecified: Secondary | ICD-10-CM

## 2024-05-15 DIAGNOSIS — N1831 Chronic kidney disease, stage 3a: Secondary | ICD-10-CM | POA: Diagnosis present

## 2024-05-15 DIAGNOSIS — F32A Depression, unspecified: Secondary | ICD-10-CM | POA: Diagnosis present

## 2024-05-15 DIAGNOSIS — I2489 Other forms of acute ischemic heart disease: Secondary | ICD-10-CM | POA: Diagnosis present

## 2024-05-15 DIAGNOSIS — I5020 Unspecified systolic (congestive) heart failure: Secondary | ICD-10-CM | POA: Diagnosis not present

## 2024-05-15 DIAGNOSIS — Z8249 Family history of ischemic heart disease and other diseases of the circulatory system: Secondary | ICD-10-CM | POA: Diagnosis not present

## 2024-05-15 DIAGNOSIS — I5023 Acute on chronic systolic (congestive) heart failure: Secondary | ICD-10-CM | POA: Diagnosis present

## 2024-05-15 DIAGNOSIS — R0602 Shortness of breath: Secondary | ICD-10-CM | POA: Diagnosis not present

## 2024-05-15 DIAGNOSIS — J810 Acute pulmonary edema: Secondary | ICD-10-CM | POA: Diagnosis not present

## 2024-05-15 DIAGNOSIS — Z7985 Long-term (current) use of injectable non-insulin antidiabetic drugs: Secondary | ICD-10-CM | POA: Diagnosis not present

## 2024-05-15 DIAGNOSIS — E785 Hyperlipidemia, unspecified: Secondary | ICD-10-CM | POA: Diagnosis present

## 2024-05-15 DIAGNOSIS — I5043 Acute on chronic combined systolic (congestive) and diastolic (congestive) heart failure: Secondary | ICD-10-CM | POA: Diagnosis not present

## 2024-05-15 DIAGNOSIS — Z7901 Long term (current) use of anticoagulants: Secondary | ICD-10-CM | POA: Diagnosis not present

## 2024-05-15 DIAGNOSIS — I2510 Atherosclerotic heart disease of native coronary artery without angina pectoris: Secondary | ICD-10-CM | POA: Diagnosis present

## 2024-05-15 DIAGNOSIS — E669 Obesity, unspecified: Secondary | ICD-10-CM | POA: Diagnosis present

## 2024-05-15 DIAGNOSIS — I071 Rheumatic tricuspid insufficiency: Secondary | ICD-10-CM | POA: Diagnosis present

## 2024-05-15 DIAGNOSIS — E1122 Type 2 diabetes mellitus with diabetic chronic kidney disease: Secondary | ICD-10-CM | POA: Diagnosis present

## 2024-05-15 DIAGNOSIS — F1410 Cocaine abuse, uncomplicated: Secondary | ICD-10-CM | POA: Diagnosis present

## 2024-05-15 DIAGNOSIS — Z91148 Patient's other noncompliance with medication regimen for other reason: Secondary | ICD-10-CM | POA: Diagnosis not present

## 2024-05-15 DIAGNOSIS — I428 Other cardiomyopathies: Secondary | ICD-10-CM | POA: Diagnosis present

## 2024-05-15 DIAGNOSIS — Z833 Family history of diabetes mellitus: Secondary | ICD-10-CM | POA: Diagnosis not present

## 2024-05-15 DIAGNOSIS — Z79899 Other long term (current) drug therapy: Secondary | ICD-10-CM | POA: Diagnosis not present

## 2024-05-15 DIAGNOSIS — Z7982 Long term (current) use of aspirin: Secondary | ICD-10-CM | POA: Diagnosis not present

## 2024-05-15 DIAGNOSIS — I272 Pulmonary hypertension, unspecified: Secondary | ICD-10-CM | POA: Diagnosis present

## 2024-05-15 LAB — URINE DRUG SCREEN
Amphetamines: NEGATIVE
Barbiturates: NEGATIVE
Benzodiazepines: NEGATIVE
Cocaine: NEGATIVE
Fentanyl: NEGATIVE
Methadone Scn, Ur: NEGATIVE
Opiates: NEGATIVE
Tetrahydrocannabinol: NEGATIVE

## 2024-05-15 LAB — TROPONIN T, HIGH SENSITIVITY: Troponin T High Sensitivity: 64 ng/L — ABNORMAL HIGH (ref 0–19)

## 2024-05-15 MED ORDER — SACUBITRIL-VALSARTAN 97-103 MG PO TABS
1.0000 | ORAL_TABLET | Freq: Two times a day (BID) | ORAL | Status: DC
  Administered 2024-05-15 – 2024-05-23 (×17): 1 via ORAL
  Filled 2024-05-15 (×18): qty 1

## 2024-05-15 MED ORDER — DIGOXIN 125 MCG PO TABS
0.0625 mg | ORAL_TABLET | Freq: Every day | ORAL | Status: DC
  Administered 2024-05-15 – 2024-05-23 (×9): 0.0625 mg via ORAL
  Filled 2024-05-15 (×10): qty 0.5

## 2024-05-15 MED ORDER — TRAZODONE HCL 50 MG PO TABS
25.0000 mg | ORAL_TABLET | Freq: Every evening | ORAL | Status: DC | PRN
  Administered 2024-05-18 – 2024-05-20 (×3): 25 mg via ORAL
  Filled 2024-05-15 (×3): qty 1

## 2024-05-15 MED ORDER — DAPAGLIFLOZIN PROPANEDIOL 10 MG PO TABS
10.0000 mg | ORAL_TABLET | Freq: Every day | ORAL | Status: DC
  Administered 2024-05-15 – 2024-05-23 (×9): 10 mg via ORAL
  Filled 2024-05-15 (×10): qty 1

## 2024-05-15 MED ORDER — FERROUS SULFATE 325 (65 FE) MG PO TABS
325.0000 mg | ORAL_TABLET | Freq: Two times a day (BID) | ORAL | Status: DC
  Administered 2024-05-15 – 2024-05-23 (×17): 325 mg via ORAL
  Filled 2024-05-15 (×17): qty 1

## 2024-05-15 MED ORDER — ASPIRIN 81 MG PO TBEC
81.0000 mg | DELAYED_RELEASE_TABLET | Freq: Every day | ORAL | Status: DC
  Administered 2024-05-15 – 2024-05-23 (×9): 81 mg via ORAL
  Filled 2024-05-15 (×9): qty 1

## 2024-05-15 MED ORDER — FUROSEMIDE 10 MG/ML IJ SOLN
60.0000 mg | Freq: Two times a day (BID) | INTRAMUSCULAR | Status: DC
  Administered 2024-05-15 – 2024-05-20 (×10): 60 mg via INTRAVENOUS
  Filled 2024-05-15 (×2): qty 6
  Filled 2024-05-15: qty 8
  Filled 2024-05-15: qty 6
  Filled 2024-05-15 (×6): qty 8

## 2024-05-15 MED ORDER — ONDANSETRON HCL 4 MG PO TABS: 4.0000 mg | ORAL_TABLET | Freq: Four times a day (QID) | ORAL | Status: DC | PRN

## 2024-05-15 MED ORDER — SPIRONOLACTONE 25 MG PO TABS
25.0000 mg | ORAL_TABLET | Freq: Every day | ORAL | Status: DC
  Administered 2024-05-15 – 2024-05-23 (×9): 25 mg via ORAL
  Filled 2024-05-15 (×9): qty 1

## 2024-05-15 MED ORDER — ACETAMINOPHEN 325 MG PO TABS
650.0000 mg | ORAL_TABLET | Freq: Four times a day (QID) | ORAL | Status: DC | PRN
  Administered 2024-05-20: 650 mg via ORAL
  Filled 2024-05-15: qty 2

## 2024-05-15 MED ORDER — ONDANSETRON HCL 4 MG/2ML IJ SOLN
4.0000 mg | Freq: Four times a day (QID) | INTRAMUSCULAR | Status: DC | PRN
  Administered 2024-05-20: 4 mg via INTRAVENOUS
  Filled 2024-05-15: qty 2

## 2024-05-15 MED ORDER — ACETAMINOPHEN 650 MG RE SUPP: 650.0000 mg | Freq: Four times a day (QID) | RECTAL | Status: DC | PRN

## 2024-05-15 MED ORDER — MAGNESIUM HYDROXIDE 400 MG/5ML PO SUSP: 30.0000 mL | Freq: Every day | ORAL | Status: DC | PRN

## 2024-05-15 MED ORDER — ATORVASTATIN CALCIUM 20 MG PO TABS
40.0000 mg | ORAL_TABLET | Freq: Every day | ORAL | Status: DC
  Administered 2024-05-15 – 2024-05-23 (×9): 40 mg via ORAL
  Filled 2024-05-15 (×9): qty 2

## 2024-05-15 MED ORDER — FUROSEMIDE 10 MG/ML IJ SOLN: 60.0000 mg | Freq: Two times a day (BID) | INTRAMUSCULAR | Status: DC

## 2024-05-15 MED ORDER — CLOPIDOGREL BISULFATE 75 MG PO TABS
75.0000 mg | ORAL_TABLET | Freq: Every day | ORAL | Status: DC
  Administered 2024-05-15 – 2024-05-23 (×9): 75 mg via ORAL
  Filled 2024-05-15 (×9): qty 1

## 2024-05-15 MED ORDER — ENOXAPARIN SODIUM 60 MG/0.6ML IJ SOSY
0.5000 mg/kg | PREFILLED_SYRINGE | INTRAMUSCULAR | Status: DC
  Filled 2024-05-15 (×3): qty 0.6

## 2024-05-15 MED ORDER — CARVEDILOL 12.5 MG PO TABS
12.5000 mg | ORAL_TABLET | Freq: Two times a day (BID) | ORAL | Status: DC
  Administered 2024-05-15 – 2024-05-23 (×17): 12.5 mg via ORAL
  Filled 2024-05-15: qty 1
  Filled 2024-05-15 (×2): qty 2
  Filled 2024-05-15: qty 1
  Filled 2024-05-15: qty 2
  Filled 2024-05-15 (×4): qty 1
  Filled 2024-05-15 (×2): qty 2
  Filled 2024-05-15: qty 1
  Filled 2024-05-15: qty 2
  Filled 2024-05-15: qty 1
  Filled 2024-05-15: qty 2
  Filled 2024-05-15 (×2): qty 1

## 2024-05-15 NOTE — Discharge Instructions (Signed)
 Rent/Utility/Housing  Agency Name: [facility] Agency Address: [address] [place], [place] [address] Phone: [phone] Email: [email or web address] Website: [email or web address] Service(s) Offered: Housing services, self-sufficiency, congregate meal program, weatherization program, Field seismologist program, emergency food assistance,  housing counseling, home ownership program, wheels -towork program.  Agency Name: [facility] Address: [address] [place], [place] [address] Phone: [phone] (8a-4p) [phone] (8p- 10p) Email: [email or web address] Website: [email or web address] Service(s) Offered: A program for homeless and/or needy men that includes one-on-one counseling, life skills training and job rehabilitation.  Agency Name: [facility] Address: [address], [place], [place] [address] Phone: [phone] Website: www.[email or web address].org Service(s) Offered: Assistance to needy in emergency with utility bills, heating fuel, and prescriptions. Shelter for homeless 7pm-7am. August 26, 2016 15  Agency Name: [facility] [place] [facility] Address: [address], [place], [place] [address] Phone: [phone] Contact Person: Genie Key Email: [email or web address] Website: [email or web address] Service(s) Offered: Helps individuals with developmental disabilities move from housing that is more restrictive to homes where they  can achieve greater independence and have more  opportunities.  Agency Name: [facility] Address: [address], [place], [place] [address] Phone: [phone] Email: [email or web address] Website: [email or web address].org Service(s) Offered: Provides affordable housing for low-income families, elderly, and disabled individuals. Offer a wide range of  programs and services, from financial planning to  afterschool and summer programs.  Agency Name: [facility] Address: [address] [place], [place] [address] Phone: [phone] Service(s) Offered: Child support services; child welfare services; food stamps; Medicaid; work first family assistance; and aid with fuel,  rent, food and medicine.  Agency Name: [facility] [place] [employer] Address: [facility] [address] [place], [place]  [address] Phone: [phone] Website: www.familyabuseservices.org Service(s) Offered: 24 hour Crisis Line: [phone]; 24 hour Emergency Shelter; Transitional Housing; Support Groups; Scientist, physiological; [employer]; Hispanic Outreach: [phone];  Visitation Center: [phone].  Agency Name: [facility] [place]. Address: [address] [place], [place] [address] Phone: [phone] Service(s) Offered: CAP Services; Home and [employer]; Individual or Group Supports; Respite Care Non-Institutional Nursing;  Residential Supports; Respite Care and Personal Care Services; Transportation; Family and Friends Night; Recreational Activities; Three Nutritious Meals/Snacks; Consultation with Registered Dietician; Twenty-four hour Registered Nurse Access; Daily and [employer]; Camp Green Leaves; Aten for the [employer] (During Summer Months) Bingo Night (Every  Wednesday Night); Special Populations Dance Night  (Every Tuesday Night); Professional Hair Care Services.  Agency Name: [facility] Address: [address], [place], [place] [address] Phone: [phone] Contact Person: Richardo Chandler Website: [email or web address].Physicist, medical) Offered: Residential treatment facility for women; food and  clothing, educational & employment development and  transportation to work; Counsellor of financial skills;  parenting and family reunification; emotional and spiritual  support;  transitional housing for program graduates.  Agency Name: [facility] Address: [address] [place], [place] [address] Phone: [phone] Email: [email or web address] Website: [email or web address].es Service(s) Offered: Public housing units for elderly, disabled, and low income people; housing choice vouchers for income eligible  applicants; shelter plus care vouchers; and Psychologist, clinical.  Agency Name: [facility] [employer] Address: [address] [place], [place] [address] Phone: [phone] Email: [email or web address] Website: www.habitatalamance.org Service(s) Offered: Build houses for families in need of decent housing. Each adult in the family must invest 200 hours of labor on  someone else's house, work with volunteers to build their own house, attend classes  on budgeting, home maintenance, yard care, and attend homeowner association meetings.  Agency Name: [facility] Address: [address], [place], [place] [address] Phone: [phone] Website: www.rsli.org Service(s) Offered: Intermediate care facilities for intellectually delayed, Supervised Living in group homes for adults with developmental disabilities, Supervised Living for people who have dual diagnoses (MRMI), Independent Living, Supported Living, respite and a variety of CAP services, pre-vocational services, day supports, and [employer].  Agency Name: [place] [employer] [facility] Phone: [phone] Website: www.NCForeclosurePrevention.gov Service(s) Offered: Zero-interest, deferred loans to homeowners struggling to pay their mortgage. Call for more information.      Transportation Resources for [employer]  Agency Name: [facility] Agency Address: [address] [place], [place] [address] Phone: [phone] Email: [email or web address] Website: www.alamanceservices.org Service(s) Offered: Housing  services, self-sufficiency, congregate meal program, weatherization program, Field seismologist program, emergency food assistance,  housing counseling, home ownership program, wheels-towork program.  Agency Name: [facility] [employer] [phone]) Address: [address] [place], [place] [address] Phone: [phone] Website: www.acta-[email or web address].[email or web address] Service(s) Offered: Transportation for [employer], subscription and demand response; Dial-a-Ride for citizens 110 years of age or older.  Agency Name: Department of [facility] Services Address: [address] [place], [place] [address] Phone: [phone] Service(s) Offered: Child support services; child welfare services; food stamps; Medicaid; work first family assistance; and aid with fuel,  rent, food and medicine, transportation assistance.  Agency Name: [employer]abled [employer] ([employer]) Transportation  Network Phone: [phone] Service(s) Offered: Transports veterans to the [facility] medical center. Call  forty-eight hours in advance and leave the name, telephone  number, date, and time of appointment. Veteran will be  contacted by the driver the day before the appointment to  arrange a pick up point    [employer] [facility]A currently provides door to door services. ACTA connects with PART daily for services to [facility]. ACTA also performs contract services to [employer] operates 27 vehicles, all but 3 mini-vans are equipped with lifts for special needs as well as the general public. ACTA drivers are each CDL certified and trained in First Aid and CPR. ACTA was established in 2002 by [employer]. An independent Industrial/product designer. ACTA operates via Cytogeneticist with required local 10% match funding from [place]. ACTA provides over 80,000 passenger trips each year, including [facility] and [employer] sites.  Call at least by 11 AM one business day prior to needing transportation  [employer].                      [place], [place] [address]     Office Hours: Monday-Friday  8 AM - 5 PM

## 2024-05-15 NOTE — Assessment & Plan Note (Addendum)
" -  The patient will be admitted to a cardiac telemetry bed. - We will continue diuresis with IV Lasix . - Will continue Aldactone , Entresto , digoxin , Farxiga  and Coreg . - We Will follow serial troponins. - We will follow I's and O's and daily weights. - Cardiology consult be obtained. - I notified [facility] group about the patient.SABRA  "

## 2024-05-15 NOTE — Consult Note (Signed)
 "  Cardiology Consultation   Patient ID: Molly Howard MRN: [id number]; DOB: 08/01/1985  Admit date: 05/14/2024 Date of Consult: 05/15/2024  PCP:  Molly Fend, DO   [facility] Providers Cardiologist:  Molly Cave, MD  Electrophysiologist:  Molly ONEIDA HOLTS, MD (Inactive)  {   Patient Profile: Molly Howard is a 39 y.o. female with a hx of combined systolic and diastolic CHF, stage II CKD, type 2 diabetes, hypertension, nonischemic cardiomyopathy, GERD, and polysubstance abuse who is being seen 05/15/2024 for the evaluation of heart failure at the request of Molly Howard.  History of Present Illness: Molly Howard is a patient of Molly Howard and follows with him for management of heart failure, tricuspid regurgitation, nonobstructive CAD, and hypertension.  Cardiomyopathy dates back to 2021 with echo at that time showing an EF of 35 to 40%.  Cardiac MRI in 05/2020 showed an EF of 42%, mild LV dilation, RVEF 50%, and no delayed enhancement.  Echo from 07/2022 showed an EF of 25 to 30%, mildly decreased RV SF, PASP 50 mmHg, moderate to severe TR, and mild MR.  Right heart cath in 07/2022 showed elevated right greater than left heart filling pressures, pulmonary venous hypertension, moderately reduced cardiac output.  She required ongoing hospital admissions for volume overload requiring aggressive diuresis and augmentation with milrinone .  Following discharge in 07/2022, she never followed up with cardiology.  She was admitted 11/2022 for volume overload after running out of all her medications.  She continued to use cocaine regularly and was cocaine positive in that admission.  She was followed by the advanced heart failure service during the admission with symptomatic improvement following aggressive diuresis, net -36 L for the admission.  She has been evaluated by the advanced heart failure service and is not deemed to be candidate for advanced therapies due to persistently positive tox  screens for cocaine and medical nonadherence.  Patient was seen by EP and scheduled for ICD implantation.  She was hospitalized 04/2023 after losing housing and needing to move into a motel.  She was not able to take some of her medications including torsemide  for a week on admission her medications were restarted.  Patient underwent ICD implantation 06/23/2023.  Echo 08/07/2023 showed EF 30 to 35%, mild LVH, low normal RV function, mild MR, mild to moderate TR, and mild PR.  Patient follows with Molly Howard heart failure clinic has been managing GDMT.  Patient was admitted 10/2023 with acute on chronic systolic heart failure, chest pain with negative troponin, hypertension, anemia with a hemoglobin of 7.2, substance use.  Patient was diuresed -1.3L with improvement of symptoms.  EKG with no changes.  Recommended ischemic evaluation as outpatient.  The patient presented to the ER on 05/14/2024 with shortness of breath and foot pain. Reports progressive SOB for the last week. Foot pain x 2 days, no injuries or falls reported. About a month ago she was out of medications for a week, then saw Molly Howard who restarted medications and gave IV lasix . She reports baseline weight around 200lbs. Weight in 10/2023 was around 219lbs. Weight now up to 260lbs. She also reported exertional chest pain. Noted lower leg edema and abdominal fullness. Reports low salt diet and drinking 3 sodas daily.  In the ER blood pressure 174/116, pulse rate 75 bpm, respiratory rate 19, afebrile, 96% O2.  Labs showed blood glucose 245, serum creatinine 1.28, hemoglobin 11.6, proBNP 8096.  High-sensitivity troponin 67>64.  EKG showed normal sinus rhythm, 74 bpm, nonspecific  ST/T wave changes.  Chest x-ray showed pulmonary edema and increased interstitial markings. Right foot imagine showed no acute findings. the patient was given IV lasix  and admitted for further work-up.  Past Medical History:  Diagnosis Date   Acid reflux    Chronic HFrEF  (heart failure with reduced ejection fraction) (HCC)    a. 10/2019 Echo: EF 35-40%, GrII DD; b. 05/2020 Echo: EF 20-25%, glob HK; c. 10/2020 Echo: EF 30-35%, glob HK. GrII DD, Mildly red RV fxn. Mod TR; d.  05/2021 cMRI: EF 42%, no LGE. Nl RV size/fxn.   CKD (chronic kidney disease), stage II    Diabetes mellitus (HCC)    H/O medication noncompliance    Hypertension    Microcytic anemia    NICM (nonischemic cardiomyopathy) (HCC)    a. 10/2019 Echo: EF 35-40%; b. 10/2019 MV: No ischemia. Small apical defect-->breast attenuation; c. 05/2020 Echo: EF 20-25%; d. 10/2020 Echo: EF 30-35%, glob HK. GrII DD, Mildly red RV fxn. Mod TR; e. 05/2021 cMRI: EF 42%, no LGE. Nl RV size/fxn.   Obesity    Polysubstance abuse (HCC)    Stroke [facility])     Past Surgical History:  Procedure Laterality Date   CHOLECYSTECTOMY     HERNIA REPAIR     ICD IMPLANT     RIGHT HEART CATH N/A 07/28/2022   Procedure: RIGHT HEART CATH;  Surgeon: Molly Deatrice LABOR, MD;  Location: [facility] INVASIVE CV LAB;  Service: Cardiovascular;  Laterality: N/A;   SUBQ ICD IMPLANT N/A 06/27/2023   Procedure: SUBQ ICD IMPLANT;  Surgeon: Molly Molly DASEN, MD;  Location: [facility] INVASIVE CV LAB;  Service: Cardiovascular;  Laterality: N/A;     Home Medications:  Prior to Admission medications  Medication Sig Start Date End Date Taking? Authorizing Provider  Accu-Chek Softclix Lancets lancets Use as instructed 11/18/22   Molly Fend, DO  aspirin  EC 81 MG tablet Take 1 tablet (81 mg total) by mouth daily. Swallow whole. 02/24/24   Molly Drue DASEN, MD  atorvastatin  (LIPITOR) 40 MG tablet Take 1 tablet (40 mg total) by mouth daily. 02/24/24   Molly Drue DASEN, MD  carvedilol  (COREG ) 12.5 MG tablet Take 1 tablet (12.5 mg total) by mouth 2 (two) times daily with a meal. 04/19/24   Molly Molly LABOR, FNP  clopidogrel  (PLAVIX ) 75 MG tablet Take 1 tablet (75 mg total) by mouth daily. 04/19/24   Molly Molly LABOR, FNP  Continuous Glucose Sensor (FREESTYLE LIBRE 3  SENSOR) MISC Place 1 sensor on the skin every 14 days. Use to check glucose continuously 01/14/23   Molly Fend, DO  dapagliflozin  propanediol (FARXIGA ) 10 MG TABS tablet Take 1 tablet (10 mg total) by mouth once daily. 01/25/24   Molly Molly LABOR, FNP  digoxin  (LANOXIN ) 0.125 MG tablet Take 0.5 tablets (0.0625 mg total) by mouth daily. 04/19/24   Molly Molly LABOR, FNP  ferrous sulfate  325 (65 FE) MG tablet Take 1 tablet (325 mg total) by mouth 2 (two) times daily. 01/25/24 01/24/25  Molly Molly LABOR, FNP  Furosemide  (FUROSCIX ) 80 MG/10ML CTKT Inject 80 mg into the skin as directed. 04/19/24   Molly Molly LABOR, FNP  sacubitril -valsartan  (ENTRESTO ) 97-103 MG Take 1 tablet by mouth 2 (two) times daily. 01/25/24   Molly Molly LABOR, FNP  Semaglutide ,0.25 or 0.5MG /DOS, 2 MG/3ML SOPN Inject 0.25 mg into the skin once a week. Patient not taking: Reported on 04/19/2024 01/03/24   Molly Molly LABOR, FNP  spironolactone  (ALDACTONE ) 25 MG tablet Take 1 tablet (  25 mg total) by mouth daily. Patient not taking: Reported on 04/19/2024 04/12/24   Jossie Artist POUR, MD  spironolactone  (ALDACTONE ) 25 MG tablet Take 1 tablet (25 mg total) by mouth daily. 04/19/24   Molly Molly LABOR, FNP  torsemide  (DEMADEX ) 20 MG tablet Take 3 tablets (60 mg total) by mouth daily. 01/25/24   Molly Molly LABOR, FNP  Potassium Chloride  40 MEQ/15ML (20%) SOLN Take 40 mEq by mouth 2 (two) times daily. Patient not taking: Reported on 06/07/2023 07/04/20 10/18/20  Darliss Rogue, MD    Scheduled Meds:  aspirin  EC  81 mg Oral Daily   atorvastatin   40 mg Oral Daily   carvedilol   12.5 mg Oral BID WC   clopidogrel   75 mg Oral Daily   dapagliflozin  propanediol  10 mg Oral Daily   digoxin   0.0625 mg Oral Daily   enoxaparin  (LOVENOX ) injection  0.5 mg/kg Subcutaneous Q24H   ferrous sulfate   325 mg Oral BID   furosemide   60 mg Intravenous Q12H   sacubitril -valsartan   1 tablet Oral BID   spironolactone   25 mg Oral Daily   Continuous Infusions:  PRN  Meds: acetaminophen  **OR** acetaminophen , magnesium  hydroxide, ondansetron  **OR** ondansetron  (ZOFRAN ) IV, traZODone   Allergies:   Allergies[1]  Social History:   Social History   Socioeconomic History   Marital status: Single    Spouse name: Not on file   Number of children: 0   Years of education: Not on file   Highest education level: 12th grade  Occupational History   Occupation: Diaabled  Tobacco Use   Smoking status: Former    Current packs/day: 0.00    Average packs/day: 0.5 packs/day    Types: Cigarettes    Quit date: 2022    Years since quitting: 4.0   Smokeless tobacco: Never  Vaping Use   Vaping status: Never Used  Substance and Sexual Activity   Alcohol use: Not Currently   Drug use: Not Currently    Types: Cocaine    Comment: Admits to using cocaine up and will July 2024.   Sexual activity: Not Currently    Birth control/protection: None  Other Topics Concern   Not on file  Social History Narrative   Lives locally.  Does not routinely exercise.  Has been using cocaine.   Social Drivers of Health   Tobacco Use: Medium Risk (05/14/2024)   Patient History    Smoking Tobacco Use: Former    Smokeless Tobacco Use: Never    Passive Exposure: Not on file  Financial Resource Strain: High Risk (08/25/2023)   Overall Financial Resource Strain (CARDIA)    Difficulty of Paying Living Expenses: Very hard  Food Insecurity: No Food Insecurity (02/21/2024)   Epic    Worried About Programme Researcher, Broadcasting/film/video in the Last Year: Never true    Ran Out of Food in the Last Year: Never true  Transportation Needs: Unmet Transportation Needs (02/21/2024)   Epic    Lack of Transportation (Medical): Yes    Lack of Transportation (Non-Medical): Yes  Physical Activity: Not on file  Stress: Not on file  Social Connections: Unknown (08/28/2023)   Social Connection and Isolation Panel    Frequency of Communication with Friends and Family: Not on file    Frequency of Social Gatherings  with Friends and Family: Not on file    Attends Religious Services: Not on file    Active Member of Clubs or Organizations: Not on file    Attends Banker Meetings: Not  on file    Marital Status: Never married  Intimate Partner Violence: Not At Risk (02/21/2024)   Epic    Fear of Current or Ex-Partner: No    Emotionally Abused: No    Physically Abused: No    Sexually Abused: No  Depression (PHQ2-9): Low Risk (01/16/2024)   Depression (PHQ2-9)    PHQ-2 Score: 0  Alcohol Screen: Low Risk (04/15/2023)   Alcohol Screen    Last Alcohol Screening Score (AUDIT): 0  Housing: High Risk (02/21/2024)   Epic    Unable to Pay for Housing in the Last Year: Yes    Number of Times Moved in the Last Year: 1    Homeless in the Last Year: Yes  Utilities: Not At Risk (02/21/2024)   Epic    Threatened with loss of utilities: No  Health Literacy: Not on file    Family History:    Family History  Problem Relation Age of Onset   Heart failure Mother        Onset of heart failure 40s.  Died in 04/23/2022.   Diabetes Mother    Hypertension Father    Diabetes Father      ROS:  Please see the history of present illness.   All other ROS reviewed and negative.     Physical Exam/Data: Vitals:   05/15/24 0455 05/15/24 0456 05/15/24 0457 05/15/24 0459  BP:   (!) 151/92   Pulse: 75 73 73   Resp:   19   Temp:    98.3 F (36.8 C)  TempSrc:      SpO2: 98% 93% 92%   Weight:      Height:       No intake or output data in the 24 hours ending 05/15/24 0730    05/14/2024    6:58 PM 04/19/2024    9:11 AM 04/12/2024    4:47 PM  Last 3 Weights  Weight (lbs) 260 lb 260 lb 3.2 oz 250 lb  Weight (kg) 117.935 kg 118.026 kg 113.399 kg     Body mass index is 40.72 kg/m.  General:  Well nourished, well developed, in no acute distress HEENT: normal Neck: + JVD Vascular: No carotid bruits; Distal pulses 2+ bilaterally Cardiac:  normal S1, S2; RRR; no murmur  Lungs:  diffusely  diminished Jai:ipduzwizi Ext: + lower leg edema Musculoskeletal:  No deformities, BUE and BLE strength normal and equal Skin: warm and dry  Neuro:  CNs 2-12 intact, no focal abnormalities noted Psych:  Normal affect   EKG:  The EKG was personally reviewed and demonstrates:  NSR, 74bpm, RAD, nonspecific St/T wave changes Telemetry:  Telemetry was personally reviewed and demonstrates:  NSR HR 70s, PVCs, NSVT  Relevant CV Studies:  Echo 02/2024  1. Left ventricular ejection fraction, by estimation, is 25 to 30%. The  left ventricle has severely decreased function. The left ventricle  demonstrates global hypokinesis. The left ventricular internal cavity size  was mildly dilated. There is moderate  asymmetric left ventricular hypertrophy of the infero-lateral segment.  Left ventricular diastolic parameters are consistent with Grade III  diastolic dysfunction (restrictive). Elevated left atrial pressure. The  average left ventricular global  longitudinal strain is -4.2 %. The global longitudinal strain is abnormal.   2. Pulmonary artery pressure is at least mildly elevated (RVSP = 35-40  mmHg plus central venous/right atrial pressure). Right ventricular  systolic function is moderately reduced. The right ventricular size is  mildly enlarged. Mildly increased right  ventricular  wall thickness.   3. Left atrial size was mildly dilated.   4. The mitral valve is normal in structure. Trivial mitral valve  regurgitation.   5. Tricuspid valve regurgitation is moderate to severe.   6. The aortic valve is tricuspid. Aortic valve regurgitation is mild. No  aortic stenosis is present.   7. Agitated saline contrast bubble study was negative, with no evidence  of any interatrial shunt.   Echo 08/2023  1. Left ventricular ejection fraction, by estimation, is 30 to 35%. The  left ventricle has moderate to severely decreased function. The left  ventricle demonstrates global hypokinesis. There is  mild left ventricular  hypertrophy. Left ventricular  diastolic parameters are indeterminate.   2. Right ventricular systolic function is low normal. The right  ventricular size is normal.   3. The mitral valve is normal in structure. Mild mitral valve  regurgitation.   4. Tricuspid valve regurgitation is mild to moderate.   5. The aortic valve is tricuspid. Aortic valve regurgitation is not  visualized.   6. The inferior vena cava is normal in size with <50% respiratory  variability, suggesting right atrial pressure of 8 mmHg.   Heart monitor 01/2023 Patient had a min HR of 62 bpm, max HR of 187 bpm, and avg HR of 76 bpm. Predominant underlying rhythm was Sinus Rhythm. 24 Ventricular Tachycardia runs occurred, the run with the fastest interval lasting 7 beats with a max rate of 187 bpm, the longest  lasting 13 beats with an avg rate of 128 bpm. 1 run of Supraventricular Tachycardia occurred lasting 6 beats with a max rate of 135 bpm (avg 116 bpm). Isolated SVEs were rare (<1.0%), SVE Couplets were rare (<1.0%), and SVE Triplets were rare (<1.0%).  Isolated VEs were rare (<1.0%, 1371), VE Couplets were rare (<1.0%, 120), and VE Triplets were rare (<1.0%, 26). Ventricular Bigeminy was present.    Conclusion Occasional nonsustained VT. 1 episode of paroxysmal SVT. No sustained arrhythmias. No atrial fibrillation or atrial flutter.  RHC 07/2022 Successful right heart catheterization via the right antecubital vein. Severely elevated right and left-sided filling pressures, moderate pulmonary hypertension and moderately reduced cardiac output.   RA: 30 mmHg RV: 56/14 mmHg PW: 34 mmHg PA: 54/30 with a mean of 41 mmHg PA sat is 42% Cardiac output: 4.47 with a cardiac index of 2. CPO is 0.89 PAPI: 0.8   Recommendations: The patient is severely volume overloaded with evidence of biventricular failure right more than left by hemodynamics. Recommend starting milrinone  drip and furosemide   drip.  Will transfer to stepdown unit.   Echo 07/2022 Successful right heart catheterization via the right antecubital vein. Severely elevated right and left-sided filling pressures, moderate pulmonary hypertension and moderately reduced cardiac output.   RA: 30 mmHg RV: 56/14 mmHg PW: 34 mmHg PA: 54/30 with a mean of 41 mmHg PA sat is 42% Cardiac output: 4.47 with a cardiac index of 2. CPO is 0.89 PAPI: 0.8   Recommendations: The patient is severely volume overloaded with evidence of biventricular failure right more than left by hemodynamics. Recommend starting milrinone  drip and furosemide  drip.  Will transfer to stepdown unit.     Laboratory Data: High Sensitivity Troponin:  No results for input(s): TROPONINIHS in the last 720 hours.  Recent Labs  Lab 05/14/24 1917 05/14/24 2341  TRNPT 67* 64*      Chemistry Recent Labs  Lab 05/14/24 1917  NA 138  K 3.6  CL 100  CO2 27  GLUCOSE  245*  BUN 21*  CREATININE 1.28*  CALCIUM  6.8*  GFRNONAA 54*  ANIONGAP 11    No results for input(s): PROT, ALBUMIN , AST, ALT, ALKPHOS, BILITOT in the last 168 hours. Lipids No results for input(s): CHOL, TRIG, HDL, LABVLDL, LDLCALC, CHOLHDL in the last 168 hours.  Hematology Recent Labs  Lab 05/14/24 1917  WBC 6.8  RBC 4.01  HGB 11.6*  HCT 37.5  MCV 93.5  MCH 28.9  MCHC 30.9  RDW 15.1  PLT 303   Thyroid  No results for input(s): TSH, FREET4 in the last 168 hours.  BNP Recent Labs  Lab 05/14/24 1917  PROBNP 8,096.0*    DDimer No results for input(s): DDIMER in the last 168 hours.  Radiology/Studies:  DG Foot Complete Right Result Date: 05/14/2024 EXAM: 3 OR MORE VIEW(S) XRAY OF THE RIGHT FOOT 05/14/2024 07:45:00 PM COMPARISON: None available. CLINICAL HISTORY: SOB FINDINGS: BONES AND JOINTS: No acute fracture. No malalignment. SOFT TISSUES: Vascular calcifications. IMPRESSION: 1. No acute findings. Electronically signed by: Oneil Devonshire MD MD  05/14/2024 08:35 PM EST RP Workstation: [id number]   DG Chest 2 View Result Date: 05/14/2024 EXAM: 2 VIEW(S) XRAY OF THE CHEST 05/14/2024 07:45:00 PM COMPARISON: 04/12/2024 CLINICAL HISTORY: SOB FINDINGS: LUNGS AND PLEURA: Left midlung zone linear scarring. Pulmonary edema and interstitial markings. No pleural effusion. No pneumothorax. HEART AND MEDIASTINUM: Cardiomegaly. Cardiac defibrillator in place. BONES AND SOFT TISSUES: No acute osseous abnormality. IMPRESSION: 1. Pulmonary edema and increased interstitial markings. 2. Cardiomegaly with cardiac defibrillator in place. Electronically signed by: Oneil Devonshire MD MD 05/14/2024 08:34 PM EST RP Workstation: [id number]     Assessment and Plan:  Acute on chronic biventricular heart failure - she has a long history of reduced EF, suspected NICM -CMRI 05/2021 LVEF 42% -Echo 06/30/21: EF  30-35% along with mild MR. - Echo 09/18/21: EF  25-30%.   - Echo 07/25/22: EF 25-30% with moderately elevated PA pressure of 50.7 mmHg, mild LAE, mild MR, small pericardial effusion and moderate/ severe TR - Echo 08/07/23: EF 30-35% with mild LVH, low/ normal RV, mild MR, mild/ moderate TR - Echo 02/21/24 showed LVEF 25-30%, G3DD, trivial MR, mild AI - presented with SOB found to have proBNP 8000 and CXR with pulmonary edema and interstitial markings - farxiga  10mg  daily - spiro 25mg  daily - Entresto  97-103mg BID - dig 0.062mg  daily - IV lasix  60mg  BID - weight up to 260lbs. Patient reports dry weight of 200lbs, may be more around 220lbs.  - monintor strict I/Os, daily weights, kidney function  Elevated troponin - HS trop 67>64 - ASA, coreg , Lipitor - she reports exertional chest pain, may be form volume overload -stress test in 2021 showed TWI in leads 1 and aVL, low EF, high risk due to low EF, which was felt to be NICM - most recent echo 02/2024 showed stable LVEF 25-30% - suspect supply/demand mismatch - no plan for ischemic eval at this  time  Hypertensive urgency - BP 174/116 on admission - restarted on home medications with improvement - IV lasix  - most recent BP 145/92  Substance use - denies recent drug use   For questions or updates, please contact [facility] Please consult [email or web address] for contact info under      Signed, Tylerjames Hoglund VEAR Fishman, PA-C  05/15/2024 7:30 AM     [1]  Allergies Allergen Reactions   Other Rash    Lemons    "

## 2024-05-15 NOTE — Assessment & Plan Note (Signed)
-   Will continue aspirin  and Plavix , beta-blocker therapy and statin therapy.

## 2024-05-15 NOTE — Progress Notes (Signed)
 PHARMACIST - PHYSICIAN COMMUNICATION  CONCERNING:  Enoxaparin  (Lovenox ) for DVT Prophylaxis    RECOMMENDATION: Patient was prescribed enoxaprin 40mg  q24 hours for VTE prophylaxis.   Filed Weights   05/14/24 1858  Weight: 117.9 kg (260 lb)    Body mass index is 40.72 kg/m.  Estimated Creatinine Clearance: 78.3 mL/min (A) (by C-G formula based on SCr of 1.28 mg/dL (H)).   Based on [facility] policy patient is candidate for enoxaparin  0.5mg /kg TBW SQ every 24 hours based on BMI being >30.  DESCRIPTION: Pharmacy has adjusted enoxaparin  dose per [facility] policy.  Patient is now receiving enoxaparin  60 mg every 24 hours    Damien Napoleon, PharmD Clinical Pharmacist  05/15/2024 2:45 AM

## 2024-05-15 NOTE — ED Notes (Signed)
 Requested missing lasix  from main rx

## 2024-05-15 NOTE — Assessment & Plan Note (Signed)
-   This could be contributing to #1. - Will continue antihypertensive therapy. - BP is currently improved

## 2024-05-15 NOTE — ED Notes (Signed)
Meds given  pt alert 

## 2024-05-15 NOTE — TOC CM/SW Note (Signed)
 Transition of Care [facility]) - Inpatient Brief Assessment   Patient Details  Name: KARRIN EISENMENGER MRN: [id number] Date of Birth: [birth year]  Transition of Care [facility]) CM/SW Contact:    Lauraine JAYSON Carpen, LCSW Phone Number: 05/15/2024, 12:36 PM   Clinical Narrative: CSW reviewed chart. SDOH flags for housing and transportation. Resources added to AVS. No other TOC needs identified at this time. CSW will continue to follow progress. Please place [facility] consult if any needs arise.  Transition of Care Asessment: Insurance and Status: Insurance coverage has been reviewed Patient has primary care physician: Yes Home environment has been reviewed: Single family home Prior level of function:: Not documented Prior/Current Home Services: No current home services Social Drivers of Health Review: SDOH reviewed interventions complete Readmission risk has been reviewed: Yes Transition of care needs: no transition of care needs at this time

## 2024-05-15 NOTE — Progress Notes (Addendum)
 Heart Failure Stewardship Pharmacy Note  PCP: [name], DO PCP-Cardiologist: Redell Cave, MD  HPI: Molly Howard is a 39 y.o. female with a history of DM, HTN, previous drug use, NSVT, PSVT, mod/severe TR, CKD, CVS 10/25, anemia and chronic biventricular heart failure  who presented with SOB and right foot pain.   On admission, proBNP was 8096, HS-troponin was 64, Scr 1.28, K 3.6, Ca 6.8. Chest XR on admission noted pulmonary edema and increased interstitial markings.   Pertinent cardiac history:  Echo 10/11/20: EF 30-35% along with moderate TR.  Echo 03/20/21: EF 25-30% along with severely elevated PA pressure of 70.8 mmHg, mild LAE, mild MR and mild/moderate TR Cardiac MRI 05/19/21:  Mild LV dilatation, mild hypertrophy, and mild systolic dysfunction (EF 42%), no LGE, normal RV size and systolic function (EF 50%). Echo 07/25/22: EF 25-30% with moderately elevated PA pressure of 50.7 mmHg, mild LAE, mild MR, small pericardial effusion and moderate/ severe TR  Echo with bubble study 02/21/24: EF 25-30%, moderate LVH, G3DD, abnormal GLS of -4.2%, RV moderately reduced, mod/severe TR, no interatrial shunt.     Pertinent Lab Values: Creat  Date Value Ref Range Status  01/16/2024 1.30 (H) 0.50 - 0.97 mg/dL Final   Creatinine, Ser  Date Value Ref Range Status  05/14/2024 1.28 (H) 0.44 - 1.00 mg/dL Final   BUN  Date Value Ref Range Status  05/14/2024 21 (H) 6 - 20 mg/dL Final  97/89/7974 15 6 - 20 mg/dL Final   Potassium  Date Value Ref Range Status  05/14/2024 3.6 3.5 - 5.1 mmol/L Final   Sodium  Date Value Ref Range Status  05/14/2024 138 135 - 145 mmol/L Final  06/13/2023 142 134 - 144 mmol/L Final   B Natriuretic Peptide  Date Value Ref Range Status  01/04/2024 2,362.6 (H) 0.0 - 100.0 pg/mL Final    Comment:    Performed at [facility], [address], [place], [place] [address]   Magnesium   Date Value Ref Range Status  10/02/2023 2.5 (H) 1.7 - 2.4  mg/dL Final    Comment:    Performed at [facility], [address] [place], [place] [address]   Hgb A1c MFr Bld  Date Value Ref Range Status  01/16/2024 8.4 (H) <5.7 % Final    Comment:    For someone without known diabetes, a hemoglobin A1c value of 6.5% or greater indicates that they may have  diabetes and this should be confirmed with a follow-up  test. . For someone with known diabetes, a value <7% indicates  that their diabetes is well controlled and a value  greater than or equal to 7% indicates suboptimal  control. A1c targets should be individualized based on  duration of diabetes, age, comorbid conditions, and  other considerations. . Currently, no consensus exists regarding use of hemoglobin A1c for diagnosis of diabetes for children. .    Digoxin  Level  Date Value Ref Range Status  01/11/2024 0.9 0.8 - 2.0 ng/mL Final    Comment:    Performed at [facility], [address] [place], [place] [address]   TSH  Date Value Ref Range Status  11/09/2021 5.114 (H) 0.350 - 4.500 uIU/mL Final    Comment:    Performed by a 3rd Generation assay with a functional sensitivity of <=0.01 uIU/mL. Performed at [facility], [address] [place], [place] [address]    LDH  Date Value Ref Range Status  04/15/2023 216 (H)  98 - 192 U/L Final    Comment:    Performed at [facility], [address] [place], [place] [address]    Vital Signs: Temp:  [98 F (36.7 C)-98.3 F (36.8 C)] 98.3 F (36.8 C) (01/13 0459) Pulse Rate:  [73-77] 73 (01/13 0457) Cardiac Rhythm: Normal sinus rhythm (01/13 0230) Resp:  [19-21] 19 (01/13 0457) BP: (151-174)/(89-116) 151/92 (01/13 0457) SpO2:  [92 %-99 %] 92 % (01/13 0457) Weight:  [117.9 kg (260 lb)] 117.9 kg (260 lb) (01/12 1858) No intake or output data in the 24 hours ending 05/15/24 9177  Current Heart Failure Medications:  Loop diuretic: furosemide  60 mg IV q12h  Beta-Blocker:  carvedilol  12.5 mg BID ACEI/ARB/ARNI: Entresto  97/103 mg BID MRA: spironolactone  25 mg daily  SGLT2i: Farxiga  10 mg daily  Other: atorvastatin  40 mg daily, digoxin  0.0625 mg daily   Prior to admission Heart Failure Medications:  Loop diuretic: torsemide  60 mg daily  Beta-Blocker: carvedilol  12.5 mg BID ACEI/ARB/ARNI: Entresto  97/103 mg BID MRA: spironolactone  25 mg daily  SGLT2i: Farxiga  10 mg daily  Other: atorvastatin  40 mg daily, digoxin  0.0625 mg daily    Assessment: 1. Acute on chronic systolic heart failure (LVEF 25-30%)  , due to previous cocaine use and uncontrolled HTN. NYHA class III symptoms.   -Symptoms: Reports SOB, reports some swelling  -Volume: Dark yellow-orange urine, reports good urine output, appears hypervolemic. Current furosemide  dose of 60 mg q12h. Will monitor response tomorrow.   -Hemodynamics: BP 151-174/92-116, HR 70s -BB: Continue carvedilol  12.5 mg BID  -ACEI/ARB/ARNI: Continue Entresto  97/103 mg BID. Patient reports being out of Entresto  97/103 mg.  -MRA: Continue spironolactone  25 mg daily  -SGLT2i:  Continue Farxiga  10 mg daily. No symptoms of UTI. Patient reports being out of Farxiga  10 mg.  - Plan: 1) Medication changes recommended at this time: - None  2) Patient assistance: -Pending  3) Education:  - Patient has been educated on current HF medications and potential additions to HF medication regimen - Patient verbalizes understanding that over the next few months, these medication doses may change and more medications may be added to optimize HF regimen - Patient has been educated on basic disease state pathophysiology and goals of therapy .   Harlene Benders, PY2 PharmD Candidate   Please do not hesitate to reach out with questions or concerns,  Jaun Bash, PharmD, CPP, BCPS, [facility] Heart Failure Pharmacist  Phone - [phone] 05/15/2024 4:15 PM

## 2024-05-15 NOTE — Assessment & Plan Note (Signed)
 Will continue statin therapy

## 2024-05-15 NOTE — H&P (Signed)
 "     [place]   PATIENT NAME: Molly Howard    MR#:  [id number]  DATE OF BIRTH:  Mar 19, 1986  DATE OF ADMISSION:  05/14/2024  PRIMARY CARE PHYSICIAN: [name], DO   Patient is coming from: Home  REQUESTING/REFERRING PHYSICIAN: Ward, Josette SAILOR, DO  CHIEF COMPLAINT:   Chief Complaint  Patient presents with   Shortness of Breath   Foot Pain    HISTORY OF PRESENT ILLNESS:  Molly Howard is a 39 y.o. African-American female with medical history significant for GERD, chronic HFrEF, type diabetes mellitus with stage II chronic kidney disease, nonischemic cardiomyopathy, polysubstance abuse and CVA, who presented to the emergency room with acute onset of worsening dyspnea over the last couple of days with associated orthopnea, paroxysmal nocturnal dyspnea and lower extremity edema.  She admitted to dry cough and wheezing.  She has been having dyspnea on exertion.  No fever or chills.  No nausea or vomiting or abdominal pain.  No dysuria, oliguria or hematuria or flank pain.  ED Course: When she came to the ER BP was 174/116 with otherwise normal vital signs.  Labs revealed creatinine of 1.28 close to previous levels and calcium  6.8 with a BUN at 21.  proBNP was 8096.  High sensitive troponin T was 67 and later 64.  CBC showed hemoglobin 11.6 hematocrit 37.5. EKG as reviewed by me : EKG showed sinus rhythm at a rate of 74 with premature supraventricular complexes and possible left atrial enlargement and Q waves anteroseptally with T wave inversion inferolaterally. Imaging: 2 view chest x-ray showed pulm edema and increased interstitial markings as well as cardiomegaly with AICD in place.  Right foot x-ray showed no acute findings.  The patient was given 120 mg of IV Lasix .  She will be admitted to an observation telemetry bed for further evaluation and management PAST MEDICAL HISTORY:   Past Medical History:  Diagnosis Date   Acid reflux    Chronic HFrEF (heart failure with  reduced ejection fraction) (HCC)    a. 10/2019 Echo: EF 35-40%, GrII DD; b. 05/2020 Echo: EF 20-25%, glob HK; c. 10/2020 Echo: EF 30-35%, glob HK. GrII DD, Mildly red RV fxn. Mod TR; d.  05/2021 cMRI: EF 42%, no LGE. Nl RV size/fxn.   CKD (chronic kidney disease), stage II    Diabetes mellitus (HCC)    H/O medication noncompliance    Hypertension    Microcytic anemia    NICM (nonischemic cardiomyopathy) (HCC)    a. 10/2019 Echo: EF 35-40%; b. 10/2019 MV: No ischemia. Small apical defect-->breast attenuation; c. 05/2020 Echo: EF 20-25%; d. 10/2020 Echo: EF 30-35%, glob HK. GrII DD, Mildly red RV fxn. Mod TR; e. 05/2021 cMRI: EF 42%, no LGE. Nl RV size/fxn.   Obesity    Polysubstance abuse (HCC)    Stroke [facility])     PAST SURGICAL HISTORY:   Past Surgical History:  Procedure Laterality Date   CHOLECYSTECTOMY     HERNIA REPAIR     ICD IMPLANT     RIGHT HEART CATH N/A 07/28/2022   Procedure: RIGHT HEART CATH;  Surgeon: Darron Deatrice LABOR, MD;  Location: [facility] INVASIVE CV LAB;  Service: Cardiovascular;  Laterality: N/A;   SUBQ ICD IMPLANT N/A 06/27/2023   Procedure: SUBQ ICD IMPLANT;  Surgeon: Cindie Ole DASEN, MD;  Location: [facility] INVASIVE CV LAB;  Service: Cardiovascular;  Laterality: N/A;    SOCIAL HISTORY:   Social History   Tobacco Use   Smoking status:  Former    Current packs/day: 0.00    Average packs/day: 0.5 packs/day    Types: Cigarettes    Quit date: 2022    Years since quitting: 4.0   Smokeless tobacco: Never  Substance Use Topics   Alcohol use: Not Currently    FAMILY HISTORY:   Family History  Problem Relation Age of Onset   Heart failure Mother        Onset of heart failure 40s.  Died in 05-07-22.   Diabetes Mother    Hypertension Father    Diabetes Father     DRUG ALLERGIES:  Allergies[1]  REVIEW OF SYSTEMS:   ROS As per history of present illness. All pertinent systems were reviewed above. Constitutional, HEENT, cardiovascular, respiratory, GI, GU,  musculoskeletal, neuro, psychiatric, endocrine, integumentary and hematologic systems were reviewed and are otherwise negative/unremarkable except for positive findings mentioned above in the HPI.   MEDICATIONS AT HOME:   Prior to Admission medications  Medication Sig Start Date End Date Taking? Authorizing Provider  Accu-Chek Softclix Lancets lancets Use as instructed 11/18/22   [name], DO  aspirin  EC 81 MG tablet Take 1 tablet (81 mg total) by mouth daily. Swallow whole. 02/24/24   Dorinda Drue DASEN, MD  atorvastatin  (LIPITOR) 40 MG tablet Take 1 tablet (40 mg total) by mouth daily. 02/24/24   Dorinda Drue DASEN, MD  carvedilol  (COREG ) 12.5 MG tablet Take 1 tablet (12.5 mg total) by mouth 2 (two) times daily with a meal. 04/19/24   Donette Ellouise LABOR, FNP  clopidogrel  (PLAVIX ) 75 MG tablet Take 1 tablet (75 mg total) by mouth daily. 04/19/24   Donette Ellouise LABOR, FNP  Continuous Glucose Sensor (FREESTYLE LIBRE 3 SENSOR) MISC Place 1 sensor on the skin every 14 days. Use to check glucose continuously 01/14/23   [name], DO  dapagliflozin  propanediol (FARXIGA ) 10 MG TABS tablet Take 1 tablet (10 mg total) by mouth once daily. 01/25/24   Donette Ellouise LABOR, FNP  digoxin  (LANOXIN ) 0.125 MG tablet Take 0.5 tablets (0.0625 mg total) by mouth daily. 04/19/24   Donette Ellouise LABOR, FNP  ferrous sulfate  325 (65 FE) MG tablet Take 1 tablet (325 mg total) by mouth 2 (two) times daily. 01/25/24 01/24/25  Donette Ellouise LABOR, FNP  Furosemide  (FUROSCIX ) 80 MG/10ML CTKT Inject 80 mg into the skin as directed. 04/19/24   Donette Ellouise LABOR, FNP  sacubitril -valsartan  (ENTRESTO ) 97-103 MG Take 1 tablet by mouth 2 (two) times daily. 01/25/24   Donette Ellouise LABOR, FNP  Semaglutide ,0.25 or 0.5MG /DOS, 2 MG/3ML SOPN Inject 0.25 mg into the skin once a week. Patient not taking: Reported on 04/19/2024 01/03/24   Donette Ellouise LABOR, FNP  spironolactone  (ALDACTONE ) 25 MG tablet Take 1 tablet (25 mg total) by mouth daily. Patient not  taking: Reported on 04/19/2024 04/12/24   Jossie Artist POUR, MD  spironolactone  (ALDACTONE ) 25 MG tablet Take 1 tablet (25 mg total) by mouth daily. 04/19/24   Donette Ellouise LABOR, FNP  torsemide  (DEMADEX ) 20 MG tablet Take 3 tablets (60 mg total) by mouth daily. 01/25/24   Donette Ellouise LABOR, FNP  Potassium Chloride  40 MEQ/15ML (20%) SOLN Take 40 mEq by mouth 2 (two) times daily. Patient not taking: Reported on 06/07/2023 07/04/20 10/18/20  Darliss Rogue, MD      VITAL SIGNS:  Blood pressure (!) 151/92, pulse 73, temperature 98.3 F (36.8 C), resp. rate 19, height 5' 7 (1.702 m), weight 117.9 kg, SpO2 92%.  PHYSICAL EXAMINATION:  Physical Exam  GENERAL:  39 y.o.-year-old African-American female patient lying in the bed with no acute distress.  EYES: Pupils equal, round, reactive to light and accommodation. No scleral icterus. Extraocular muscles intact.  HEENT: Head atraumatic, normocephalic. Oropharynx and nasopharynx clear.  NECK:  Supple, no jugular venous distention. No thyroid  enlargement, no tenderness.  LUNGS: Diminished bibasilar breath sounds with bibasilar rales. No use of accessory muscles of respiration.  CARDIOVASCULAR: Regular rate and rhythm, S1, S2 normal. No murmurs, rubs, or gallops.  ABDOMEN: Soft, nondistended, nontender. Bowel sounds present. No organomegaly or mass.  EXTREMITIES: No pedal edema, cyanosis, or clubbing.  NEUROLOGIC: Cranial nerves II through XII are intact. Muscle strength 5/5 in all extremities. Sensation intact. Gait not checked.  PSYCHIATRIC: The patient is alert and oriented x 3.  Normal affect and good eye contact. SKIN: No obvious rash, lesion, or ulcer.   LABORATORY PANEL:   CBC Recent Labs  Lab 05/14/24 1917  WBC 6.8  HGB 11.6*  HCT 37.5  PLT 303   ------------------------------------------------------------------------------------------------------------------  Chemistries  Recent Labs  Lab 05/14/24 1917  NA 138  K 3.6  CL 100  CO2  27  GLUCOSE 245*  BUN 21*  CREATININE 1.28*  CALCIUM  6.8*   ------------------------------------------------------------------------------------------------------------------  Cardiac Enzymes No results for input(s): TROPONINI in the last 168 hours. ------------------------------------------------------------------------------------------------------------------  RADIOLOGY:  DG Foot Complete Right Result Date: 05/14/2024 EXAM: 3 OR MORE VIEW(S) XRAY OF THE RIGHT FOOT 05/14/2024 07:45:00 PM COMPARISON: None available. CLINICAL HISTORY: SOB FINDINGS: BONES AND JOINTS: No acute fracture. No malalignment. SOFT TISSUES: Vascular calcifications. IMPRESSION: 1. No acute findings. Electronically signed by: Oneil Devonshire MD MD 05/14/2024 08:35 PM EST RP Workstation: [id number]   DG Chest 2 View Result Date: 05/14/2024 EXAM: 2 VIEW(S) XRAY OF THE CHEST 05/14/2024 07:45:00 PM COMPARISON: 04/12/2024 CLINICAL HISTORY: SOB FINDINGS: LUNGS AND PLEURA: Left midlung zone linear scarring. Pulmonary edema and interstitial markings. No pleural effusion. No pneumothorax. HEART AND MEDIASTINUM: Cardiomegaly. Cardiac defibrillator in place. BONES AND SOFT TISSUES: No acute osseous abnormality. IMPRESSION: 1. Pulmonary edema and increased interstitial markings. 2. Cardiomegaly with cardiac defibrillator in place. Electronically signed by: Oneil Devonshire MD MD 05/14/2024 08:34 PM EST RP Workstation: [id number]      IMPRESSION AND PLAN:  Assessment and Plan: * Acute on chronic systolic CHF (congestive heart failure) (HCC)  -The patient will be admitted to a cardiac telemetry bed. - We will continue diuresis with IV Lasix . - Will continue Aldactone , Entresto , digoxin , Farxiga  and Coreg . - We Will follow serial troponins. - We will follow I's and O's and daily weights. - Cardiology consult be obtained. - I notified [facility] group about the patient..   Hypertensive urgency - This could be contributing to #1. - Will  continue antihypertensive therapy. - BP is currently improved  Dyslipidemia - Will continue statin therapy.  Coronary artery disease - Will continue aspirin  and Plavix , beta-blocker therapy and statin therapy.   DVT prophylaxis: Lovenox .  Advanced Care Planning:  Code Status: full code.  Family Communication:  The plan of care was discussed in details with the patient (and family). I answered all questions. The patient agreed to proceed with the above mentioned plan. Further management will depend upon hospital course. Disposition Plan: Back to previous home environment Consults called: none.  All the records are reviewed and case discussed with ED provider.  Status is: Observation  I certify that at the time of admission, it is my clinical judgment that the patient will require hospital care extending less than  2 midnights.                            Dispo: The patient is from: Home              Anticipated d/c is to: Home              Patient currently is not medically stable to d/c.              Difficult to place patient: No  Madison DELENA Peaches M.D on 05/15/2024 at 5:44 AM  [facility]   From 7 PM-7 AM, contact night-coverage [email or web address]  CC: Primary care physician; [name], DO     [1]  Allergies Allergen Reactions   Other Rash    Lemons    "

## 2024-05-16 ENCOUNTER — Other Ambulatory Visit: Payer: Self-pay

## 2024-05-16 DIAGNOSIS — I2489 Other forms of acute ischemic heart disease: Secondary | ICD-10-CM | POA: Diagnosis not present

## 2024-05-16 DIAGNOSIS — I5023 Acute on chronic systolic (congestive) heart failure: Secondary | ICD-10-CM | POA: Diagnosis not present

## 2024-05-16 DIAGNOSIS — J810 Acute pulmonary edema: Secondary | ICD-10-CM | POA: Diagnosis not present

## 2024-05-16 DIAGNOSIS — N1831 Chronic kidney disease, stage 3a: Secondary | ICD-10-CM | POA: Diagnosis not present

## 2024-05-16 DIAGNOSIS — I509 Heart failure, unspecified: Secondary | ICD-10-CM | POA: Diagnosis not present

## 2024-05-16 DIAGNOSIS — Z8673 Personal history of transient ischemic attack (TIA), and cerebral infarction without residual deficits: Secondary | ICD-10-CM | POA: Diagnosis not present

## 2024-05-16 DIAGNOSIS — E1122 Type 2 diabetes mellitus with diabetic chronic kidney disease: Secondary | ICD-10-CM

## 2024-05-16 DIAGNOSIS — I5043 Acute on chronic combined systolic (congestive) and diastolic (congestive) heart failure: Secondary | ICD-10-CM | POA: Diagnosis not present

## 2024-05-16 DIAGNOSIS — I428 Other cardiomyopathies: Secondary | ICD-10-CM | POA: Diagnosis not present

## 2024-05-16 DIAGNOSIS — I5020 Unspecified systolic (congestive) heart failure: Secondary | ICD-10-CM | POA: Diagnosis not present

## 2024-05-16 DIAGNOSIS — R0602 Shortness of breath: Secondary | ICD-10-CM

## 2024-05-16 DIAGNOSIS — E785 Hyperlipidemia, unspecified: Secondary | ICD-10-CM | POA: Diagnosis not present

## 2024-05-16 DIAGNOSIS — I160 Hypertensive urgency: Secondary | ICD-10-CM | POA: Diagnosis not present

## 2024-05-16 DIAGNOSIS — I25118 Atherosclerotic heart disease of native coronary artery with other forms of angina pectoris: Secondary | ICD-10-CM | POA: Diagnosis not present

## 2024-05-16 DIAGNOSIS — Z91148 Patient's other noncompliance with medication regimen for other reason: Secondary | ICD-10-CM | POA: Diagnosis not present

## 2024-05-16 LAB — CBC
HCT: 35.9 % — ABNORMAL LOW (ref 36.0–46.0)
Hemoglobin: 11.3 g/dL — ABNORMAL LOW (ref 12.0–15.0)
MCH: 29 pg (ref 26.0–34.0)
MCHC: 31.5 g/dL (ref 30.0–36.0)
MCV: 92.1 fL (ref 80.0–100.0)
Platelets: 271 K/uL (ref 150–400)
RBC: 3.9 MIL/uL (ref 3.87–5.11)
RDW: 15.2 % (ref 11.5–15.5)
WBC: 4.9 K/uL (ref 4.0–10.5)
nRBC: 0 % (ref 0.0–0.2)

## 2024-05-16 LAB — COMPREHENSIVE METABOLIC PANEL WITH GFR
ALT: 6 U/L (ref 0–44)
AST: 13 U/L — ABNORMAL LOW (ref 15–41)
Albumin: 2.2 g/dL — ABNORMAL LOW (ref 3.5–5.0)
Alkaline Phosphatase: 112 U/L (ref 38–126)
Anion gap: 9 (ref 5–15)
BUN: 20 mg/dL (ref 6–20)
CO2: 31 mmol/L (ref 22–32)
Calcium: 6.8 mg/dL — ABNORMAL LOW (ref 8.9–10.3)
Chloride: 102 mmol/L (ref 98–111)
Creatinine, Ser: 1.33 mg/dL — ABNORMAL HIGH (ref 0.44–1.00)
GFR, Estimated: 52 mL/min — ABNORMAL LOW
Glucose, Bld: 131 mg/dL — ABNORMAL HIGH (ref 70–99)
Potassium: 3.5 mmol/L (ref 3.5–5.1)
Sodium: 141 mmol/L (ref 135–145)
Total Bilirubin: 0.7 mg/dL (ref 0.0–1.2)
Total Protein: 5.3 g/dL — ABNORMAL LOW (ref 6.5–8.1)

## 2024-05-16 LAB — MAGNESIUM: Magnesium: 1.6 mg/dL — ABNORMAL LOW (ref 1.7–2.4)

## 2024-05-16 MED ORDER — POTASSIUM CHLORIDE CRYS ER 20 MEQ PO TBCR
40.0000 meq | EXTENDED_RELEASE_TABLET | ORAL | Status: AC
  Administered 2024-05-16 (×2): 40 meq via ORAL
  Filled 2024-05-16 (×2): qty 2

## 2024-05-16 MED ORDER — GUAIFENESIN-DM 100-10 MG/5ML PO SYRP
5.0000 mL | ORAL_SOLUTION | ORAL | Status: DC | PRN
  Administered 2024-05-16 – 2024-05-20 (×2): 5 mL via ORAL
  Filled 2024-05-16 (×2): qty 10

## 2024-05-16 MED ORDER — MAGNESIUM SULFATE 2 GM/50ML IV SOLN
2.0000 g | Freq: Once | INTRAVENOUS | Status: AC
  Administered 2024-05-16: 2 g via INTRAVENOUS
  Filled 2024-05-16: qty 50

## 2024-05-16 NOTE — Progress Notes (Signed)
 " PROGRESS NOTE    Molly Howard  FMW:[id number] DOB: [birth year] DOA: 05/14/2024 PCP: Molly Fend, DO    Brief Narrative:  39 y.o. African-American female with medical history significant for GERD, chronic HFrEF, type diabetes mellitus, CKD stage 3a, nonischemic cardiomyopathy, polysubstance abuse and CVA, who presented to the emergency room with acute onset of worsening dyspnea over the last couple of days with associated orthopnea, paroxysmal nocturnal dyspnea and lower extremity edema.  She admitted to dry cough and wheezing.  She has been having dyspnea on exertion.  No fever or chills.  No nausea or vomiting or abdominal pain.  No dysuria, oliguria or hematuria or flank pain. She has reportedly been unable to    ED Course: BP was 174/116 with otherwise normal vital signs.  Labs revealed creatinine of 1.28 close to previous levels and calcium  6.8 with a BUN at 21.  proBNP was 8096.  High sensitive troponin T was 67 and later 64.  CBC showed hemoglobin 11.6 hematocrit 37.5. ZXH:dyntzi sinus rhythm at a rate of 74 with premature supraventricular complexes and possible left atrial enlargement and Q waves anteroseptally with T wave inversion inferolaterally. Imaging: 2 view chest x-ray showed pulm edema and increased interstitial markings as well as cardiomegaly with AICD in place.  Right foot x-ray showed no acute findings.   The patient was given 120 mg of IV Lasix  and admitted for CHF exacerbation.   Assessment and Plan:  Acute on Chronic HFrEF - Presented with dyspnea with elevated ProBNP and CXR findings of pulmonary edema   - TTE (02/2024) showed LVEF 25-30% with G3DD, mod reduced RV function, mod to severe TR - Has been out of home meds, unable to afford refills - Cardiology following - Continue Coreg  12.5 mg BID, Farxiga  10 mg, aldactone  25 mg, Entresto  97-103 mg BID, digoxin  0.0625 mg daily - Continue IV Lasix  60 mg BID  Hypertensive urgency - resolved - BP elevated to  174/116 on admission - Possibly contributing to acute CHF exacerbation - Resolved with resumption of home meds  Elevated troponins - Likely secondary to demand ischemia  - No chest pain currently - Cardiology indicating no plans for ischemic workup   CKD stage 3a - Stable at baseline  CAD - Continue home aspirin  and plavix   Dyslipidemia - Continue home statin    Scheduled Meds:  aspirin  EC  81 mg Oral Daily   atorvastatin   40 mg Oral Daily   carvedilol   12.5 mg Oral BID WC   clopidogrel   75 mg Oral Daily   dapagliflozin  propanediol  10 mg Oral Daily   digoxin   0.0625 mg Oral Daily   enoxaparin  (LOVENOX ) injection  0.5 mg/kg Subcutaneous Q24H   ferrous sulfate   325 mg Oral BID   furosemide   60 mg Intravenous Q12H   sacubitril -valsartan   1 tablet Oral BID   spironolactone   25 mg Oral Daily   Continuous Infusions: PRN Meds:.acetaminophen  **OR** acetaminophen , magnesium  hydroxide, ondansetron  **OR** ondansetron  (ZOFRAN ) IV, traZODone   Current Outpatient Medications  Medication Instructions   Accu-Chek Softclix Lancets lancets Use as instructed   Aspirin  Low Dose 81 mg, Oral, Daily, Swallow whole.   atorvastatin  (LIPITOR) 40 mg, Oral, Daily   carvedilol  (COREG ) 12.5 mg, Oral, 2 times daily with meals   clopidogrel  (PLAVIX ) 75 mg, Oral, Daily   Continuous Glucose Sensor (FREESTYLE LIBRE 3 SENSOR) MISC Place 1 sensor on the skin every 14 days. Use to check glucose continuously   dapagliflozin  propanediol (FARXIGA ) 10 MG TABS tablet  Take 1 tablet (10 mg total) by mouth once daily.   digoxin  (LANOXIN ) 0.0625 mg, Oral, Daily   FeroSul 325 mg, Oral, 2 times daily   Furoscix  80 mg, Subcutaneous, As directed   Ozempic  (0.25 or 0.5 MG/DOSE) 0.25 mg, Subcutaneous, Weekly   sacubitril -valsartan  (ENTRESTO ) 97-103 MG 1 tablet, Oral, 2 times daily   spironolactone  (ALDACTONE ) 25 mg, Oral, Daily   torsemide  (DEMADEX ) 60 mg, Oral, Daily    DVT prophylaxis:    Code Status:   Code  Status: Full Code  Family Communication: None  Disposition Plan: Likely home pending clinical improvement PT -   -   OT -   -    Level of care: Telemetry  Consultants:  cardiology  Subjective:  Examined at bedside. Reports feeling somewhat better but not at baseline. Has a lingering cough and subjective wheezing, though none noted on exam.   Objective: Vitals:   05/16/24 0418 05/16/24 0550 05/16/24 0919 05/16/24 0923  BP:   (!) 149/100 (!) 149/100  Pulse: 70 73 96 96  Resp:   (!) 23   Temp:   98.4 F (36.9 C)   TempSrc:   Oral   SpO2: 98% 100% 99%   Weight:      Height:       No intake or output data in the 24 hours ending 05/16/24 1337 Filed Weights   05/14/24 1858  Weight: 117.9 kg    Examination:  Gen: NAD, A&Ox3 Neck: Supple CV: RRR, no murmurs Resp: normal WOB, CTAB, no wheezing or rhonchi Abd: Soft, NTND, no guarding, BS normoactive Ext: 1+  LE edema, Skin: Warm, dry, no rashes/lesions Neuro: CN II-XII grossly intact, strength 5/5 b/l, sensation intact, normal gait Psych: Calm, cooperative, appropriate affect    Data Reviewed: I have personally reviewed following labs and imaging studies  CBC: Recent Labs  Lab 05/14/24 1917 05/16/24 0802  WBC 6.8 4.9  HGB 11.6* 11.3*  HCT 37.5 35.9*  MCV 93.5 92.1  PLT 303 271   Basic Metabolic Panel: Recent Labs  Lab 05/14/24 1917 05/16/24 0802  NA 138 141  K 3.6 3.5  CL 100 102  CO2 27 31  GLUCOSE 245* 131*  BUN 21* 20  CREATININE 1.28* 1.33*  CALCIUM  6.8* 6.8*  MG  --  1.6*   GFR: Estimated Creatinine Clearance: 75.4 mL/min (A) (by C-G formula based on SCr of 1.33 mg/dL (H)). Liver Function Tests: Recent Labs  Lab 05/16/24 0802  AST 13*  ALT 6  ALKPHOS 112  BILITOT 0.7  PROT 5.3*  ALBUMIN  2.2*   No results for input(s): LIPASE, AMYLASE in the last 168 hours. No results for input(s): AMMONIA in the last 168 hours. Coagulation Profile: No results for input(s): INR, PROTIME  in the last 168 hours. Cardiac Enzymes: No results for input(s): CKTOTAL, CKMB, CKMBINDEX, TROPONINI in the last 168 hours. BNP (last 3 results) Recent Labs    04/20/24 1317 05/14/24 1917  PROBNP 6,696.0* 8,096.0*   HbA1C: No results for input(s): HGBA1C in the last 72 hours. CBG: No results for input(s): GLUCAP in the last 168 hours. Lipid Profile: No results for input(s): CHOL, HDL, LDLCALC, TRIG, CHOLHDL, LDLDIRECT in the last 72 hours. Thyroid  Function Tests: No results for input(s): TSH, T4TOTAL, FREET4, T3FREE, THYROIDAB in the last 72 hours. Anemia Panel: No results for input(s): VITAMINB12, FOLATE, FERRITIN, TIBC, IRON , RETICCTPCT in the last 72 hours. Sepsis Labs: No results for input(s): PROCALCITON, LATICACIDVEN in the last 168 hours.  No  results found for this or any previous visit (from the past 240 hours).   Radiology Studies: DG Foot Complete Right Result Date: 05/14/2024 EXAM: 3 OR MORE VIEW(S) XRAY OF THE RIGHT FOOT 05/14/2024 07:45:00 PM COMPARISON: None available. CLINICAL HISTORY: SOB FINDINGS: BONES AND JOINTS: No acute fracture. No malalignment. SOFT TISSUES: Vascular calcifications. IMPRESSION: 1. No acute findings. Electronically signed by: Oneil Devonshire MD MD 05/14/2024 08:35 PM EST RP Workstation: [id number]   DG Chest 2 View Result Date: 05/14/2024 EXAM: 2 VIEW(S) XRAY OF THE CHEST 05/14/2024 07:45:00 PM COMPARISON: 04/12/2024 CLINICAL HISTORY: SOB FINDINGS: LUNGS AND PLEURA: Left midlung zone linear scarring. Pulmonary edema and interstitial markings. No pleural effusion. No pneumothorax. HEART AND MEDIASTINUM: Cardiomegaly. Cardiac defibrillator in place. BONES AND SOFT TISSUES: No acute osseous abnormality. IMPRESSION: 1. Pulmonary edema and increased interstitial markings. 2. Cardiomegaly with cardiac defibrillator in place. Electronically signed by: Oneil Devonshire MD MD 05/14/2024 08:34 PM EST RP Workstation:  [id number]    Scheduled Meds:  aspirin  EC  81 mg Oral Daily   atorvastatin   40 mg Oral Daily   carvedilol   12.5 mg Oral BID WC   clopidogrel   75 mg Oral Daily   dapagliflozin  propanediol  10 mg Oral Daily   digoxin   0.0625 mg Oral Daily   enoxaparin  (LOVENOX ) injection  0.5 mg/kg Subcutaneous Q24H   ferrous sulfate   325 mg Oral BID   furosemide   60 mg Intravenous Q12H   sacubitril -valsartan   1 tablet Oral BID   spironolactone   25 mg Oral Daily   Continuous Infusions:   Unresulted Labs (From admission, onward)     Start     Ordered   05/17/24 0500  CBC  Tomorrow morning,   R        05/16/24 1344   05/17/24 0500  Basic metabolic panel with GFR  Tomorrow morning,   R        05/16/24 1344   05/17/24 0500  Magnesium   Tomorrow morning,   R        05/16/24 1344             LOS:  LOS: 1 day   Time Spent: 45 minutes  Geraldin Habermehl Al-Sultani, MD [facility]  If 7PM-7AM, please contact night-coverage  05/16/2024, 1:37 PM      "

## 2024-05-16 NOTE — Progress Notes (Signed)
 Heart Failure Navigator Progress Note  Advanced Heart Failure Team patient.  Scheduled appointment 05/30/24 @ 11:30 AM.  Added to to AVS for discharge and patient will receive an appointment card.   Navigator will sign off at this time.  Charmaine Pines, RN, BSN [facility] Heart Failure Navigator Secure Chat Only

## 2024-05-16 NOTE — Progress Notes (Signed)
 "  Progress Note  Patient Name: Molly Howard Date of Encounter: 05/16/2024 [facility] Cardiologist: Redell Cave, MD   Interval Summary    UOP - , but suspect this is inaccurate. Patient reports good UOP. No chest pain reported. Kidney function stable. Volume status is improving.  Vital Signs Vitals:   05/15/24 2336 05/16/24 0210 05/16/24 0418 05/16/24 0550  BP: 137/73     Pulse: 74  70 73  Resp: 17     Temp:  99 F (37.2 C)    TempSrc:  Oral    SpO2: 96%  98% 100%  Weight:      Height:        Intake/Output Summary (Last 24 hours) at 05/16/2024 0902 Last data filed at 05/15/2024 1030 Gross per 24 hour  Intake --  Output 400 ml  Net -400 ml      05/14/2024    6:58 PM 04/19/2024    9:11 AM 04/12/2024    4:47 PM  Last 3 Weights  Weight (lbs) 260 lb 260 lb 3.2 oz 250 lb  Weight (kg) 117.935 kg 118.026 kg 113.399 kg      Telemetry/ECG  NSR HR 60-70s, PVCs - Personally Reviewed  Physical Exam  GEN: No acute distress.   Neck: No JVD Cardiac: RRR, no murmurs, rubs, or gallops.  Respiratory: Clear to auscultation bilaterally. GI: +distended  MS: 1+ lower leg edema  Relevant CV Studies:   Echo 02/2024  1. Left ventricular ejection fraction, by estimation, is 25 to 30%. The  left ventricle has severely decreased function. The left ventricle  demonstrates global hypokinesis. The left ventricular internal cavity size  was mildly dilated. There is moderate  asymmetric left ventricular hypertrophy of the infero-lateral segment.  Left ventricular diastolic parameters are consistent with Grade III  diastolic dysfunction (restrictive). Elevated left atrial pressure. The  average left ventricular global  longitudinal strain is -4.2 %. The global longitudinal strain is abnormal.   2. Pulmonary artery pressure is at least mildly elevated (RVSP = 35-40  mmHg plus central venous/right atrial pressure). Right ventricular  systolic function is moderately  reduced. The right ventricular size is  mildly enlarged. Mildly increased right  ventricular wall thickness.   3. Left atrial size was mildly dilated.   4. The mitral valve is normal in structure. Trivial mitral valve  regurgitation.   5. Tricuspid valve regurgitation is moderate to severe.   6. The aortic valve is tricuspid. Aortic valve regurgitation is mild. No  aortic stenosis is present.   7. Agitated saline contrast bubble study was negative, with no evidence  of any interatrial shunt.    Echo 08/2023  1. Left ventricular ejection fraction, by estimation, is 30 to 35%. The  left ventricle has moderate to severely decreased function. The left  ventricle demonstrates global hypokinesis. There is mild left ventricular  hypertrophy. Left ventricular  diastolic parameters are indeterminate.   2. Right ventricular systolic function is low normal. The right  ventricular size is normal.   3. The mitral valve is normal in structure. Mild mitral valve  regurgitation.   4. Tricuspid valve regurgitation is mild to moderate.   5. The aortic valve is tricuspid. Aortic valve regurgitation is not  visualized.   6. The inferior vena cava is normal in size with <50% respiratory  variability, suggesting right atrial pressure of 8 mmHg.    Heart monitor 01/2023 Patient had a min HR of 62 bpm, max HR of 187 bpm, and avg  HR of 76 bpm. Predominant underlying rhythm was Sinus Rhythm. 24 Ventricular Tachycardia runs occurred, the run with the fastest interval lasting 7 beats with a max rate of 187 bpm, the longest  lasting 13 beats with an avg rate of 128 bpm. 1 run of Supraventricular Tachycardia occurred lasting 6 beats with a max rate of 135 bpm (avg 116 bpm). Isolated SVEs were rare (<1.0%), SVE Couplets were rare (<1.0%), and SVE Triplets were rare (<1.0%).  Isolated VEs were rare (<1.0%, 1371), VE Couplets were rare (<1.0%, 120), and VE Triplets were rare (<1.0%, 26). Ventricular Bigeminy was  present.    Conclusion Occasional nonsustained VT. 1 episode of paroxysmal SVT. No sustained arrhythmias. No atrial fibrillation or atrial flutter.   RHC 07/2022 Successful right heart catheterization via the right antecubital vein. Severely elevated right and left-sided filling pressures, moderate pulmonary hypertension and moderately reduced cardiac output.   RA: 30 mmHg RV: 56/14 mmHg PW: 34 mmHg PA: 54/30 with a mean of 41 mmHg PA sat is 42% Cardiac output: 4.47 with a cardiac index of 2. CPO is 0.89 PAPI: 0.8   Recommendations: The patient is severely volume overloaded with evidence of biventricular failure right more than left by hemodynamics. Recommend starting milrinone  drip and furosemide  drip.  Will transfer to stepdown unit.   Echo 07/2022 Successful right heart catheterization via the right antecubital vein. Severely elevated right and left-sided filling pressures, moderate pulmonary hypertension and moderately reduced cardiac output.   RA: 30 mmHg RV: 56/14 mmHg PW: 34 mmHg PA: 54/30 with a mean of 41 mmHg PA sat is 42% Cardiac output: 4.47 with a cardiac index of 2. CPO is 0.89 PAPI: 0.8   Recommendations: The patient is severely volume overloaded with evidence of biventricular failure right more than left by hemodynamics. Recommend starting milrinone  drip and furosemide  drip.  Will transfer to stepdown unit.    Assessment & Plan   Acute on chronic biventricular heart failure - she has a long history of reduced EF, suspected NICM -CMRI 05/2021 LVEF 42% -Echo 06/30/21: EF  30-35% along with mild MR. - Echo 09/18/21: EF  25-30%.   - Echo 07/25/22: EF 25-30% with moderately elevated PA pressure of 50.7 mmHg, mild LAE, mild MR, small pericardial effusion and moderate/ severe TR - Echo 08/07/23: EF 30-35% with mild LVH, low/ normal RV, mild MR, mild/ moderate TR - Echo 02/21/24 showed LVEF 25-30%, G3DD, trivial MR, mild AI - presented with SOB found to have  proBNP 8000 and CXR with pulmonary edema and interstitial markings - farxiga  10mg  daily - spiro 25mg  daily - Entresto  97-103mg BID - dig 0.062mg  daily - IV lasix  60mg  BID - weight up to 260lbs. Patient reports dry weight of 200lbs, may be more around 220lbs. Will request a standing weight today - kidney function is stable - continue with diuresis - monintor strict I/Os, daily weights, kidney function - recommend pharm consult to assess medication assistance   Elevated troponin - HS trop 67>64 - ASA, coreg , Lipitor - she reports exertional chest pain, may be form volume overload -stress test in 2021 showed TWI in leads 1 and aVL, low EF, high risk due to low EF, which was felt to be NICM - most recent echo 02/2024 showed stable LVEF 25-30% - suspect supply/demand mismatch - no plan for ischemic eval at this time   Hypertensive urgency - BP 174/116 on admission - restarted on home medications with improvement - IV lasix  - most recent BP 137/73   Substance  use - denies recent drug use - UDS negative    For questions or updates, please contact [facility] Please consult [email or web address] for contact info under         Signed, Ellenor Wisniewski VEAR Fishman, PA-C   "

## 2024-05-16 NOTE — ED Notes (Signed)
Pt will not keep blood pressure cuff on.

## 2024-05-16 NOTE — Progress Notes (Addendum)
 Heart Failure Stewardship Pharmacy Note  PCP: [name], DO PCP-Cardiologist: Redell Cave, MD  HPI: Molly Howard is a 39 y.o. female with a history of DM, HTN, previous drug use, NSVT, PSVT, mod/severe TR, CKD, CVS 10/25, anemia and chronic biventricular heart failure who presented with SOB and right foot pain.   On admission, proBNP was 8096, HS-troponin was 64, Scr 1.28, K 3.6, Ca 6.8. Chest XR on admission noted pulmonary edema and increased interstitial markings.   Pertinent cardiac history:  Echo 10/11/20: EF 30-35% along with moderate TR.  Echo 03/20/21: EF 25-30% along with severely elevated PA pressure of 70.8 mmHg, mild LAE, mild MR and mild/moderate TR Cardiac MRI 05/19/21:  Mild LV dilatation, mild hypertrophy, and mild systolic dysfunction (EF 42%), no LGE, normal RV size and systolic function (EF 50%). Echo 07/25/22: EF 25-30% with moderately elevated PA pressure of 50.7 mmHg, mild LAE, mild MR, small pericardial effusion and moderate/ severe TR  Echo with bubble study 02/21/24: EF 25-30%, moderate LVH, G3DD, abnormal GLS of -4.2%, RV moderately reduced, mod/severe TR, no interatrial shunt.    Pertinent Lab Values: Creat  Date Value Ref Range Status  01/16/2024 1.30 (H) 0.50 - 0.97 mg/dL Final   Creatinine, Ser  Date Value Ref Range Status  05/14/2024 1.28 (H) 0.44 - 1.00 mg/dL Final   BUN  Date Value Ref Range Status  05/14/2024 21 (H) 6 - 20 mg/dL Final  97/89/7974 15 6 - 20 mg/dL Final   Potassium  Date Value Ref Range Status  05/14/2024 3.6 3.5 - 5.1 mmol/L Final   Sodium  Date Value Ref Range Status  05/14/2024 138 135 - 145 mmol/L Final  06/13/2023 142 134 - 144 mmol/L Final   B Natriuretic Peptide  Date Value Ref Range Status  01/04/2024 2,362.6 (H) 0.0 - 100.0 pg/mL Final    Comment:    Performed at [facility], [address] [place], [place] [address]   Magnesium   Date Value Ref Range Status  10/02/2023 2.5 (H) 1.7 - 2.4  mg/dL Final    Comment:    Performed at [facility], [address] [place], [place] [address]   Hgb A1c MFr Bld  Date Value Ref Range Status  01/16/2024 8.4 (H) <5.7 % Final    Comment:    For someone without known diabetes, a hemoglobin A1c value of 6.5% or greater indicates that they may have  diabetes and this should be confirmed with a follow-up  test. . For someone with known diabetes, a value <7% indicates  that their diabetes is well controlled and a value  greater than or equal to 7% indicates suboptimal  control. A1c targets should be individualized based on  duration of diabetes, age, comorbid conditions, and  other considerations. . Currently, no consensus exists regarding use of hemoglobin A1c for diagnosis of diabetes for children. .    Digoxin  Level  Date Value Ref Range Status  01/11/2024 0.9 0.8 - 2.0 ng/mL Final    Comment:    Performed at [facility], [address] [place], [place] [address]   TSH  Date Value Ref Range Status  11/09/2021 5.114 (H) 0.350 - 4.500 uIU/mL Final    Comment:    Performed by a 3rd Generation assay with a functional sensitivity of <=0.01 uIU/mL. Performed at [facility], [address] [place], [place] [address]    LDH  Date Value Ref Range Status  04/15/2023 216 (H) 98 -  192 U/L Final    Comment:    Performed at [facility], [address] [place], [place] [address]    Vital Signs: Temp:  [98.2 F (36.8 C)-99 F (37.2 C)] 99 F (37.2 C) (01/14 0210) Pulse Rate:  [70-79] 73 (01/14 0550) Resp:  [15-29] 17 (01/13 2336) BP: (131-145)/(69-92) 137/73 (01/13 2336) SpO2:  [92 %-100 %] 100 % (01/14 0550)  Intake/Output Summary (Last 24 hours) at 05/16/2024 9187 Last data filed at 05/15/2024 1030 Gross per 24 hour  Intake --  Output 400 ml  Net -400 ml    Current Heart Failure Medications:  Loop diuretic: furosemide  60 mg IV q12h  Beta-Blocker: carvedilol  12.5 mg  BID ACEI/ARB/ARNI: Entresto  97/103 mg BID  MRA: spironolactone  25 mg daily  SGLT2i: Farxiga  10 mg daily  Other: atorvastatin  40 mg daily, digoxin  0.0625 mg daily   Prior to admission Heart Failure Medications:  Loop diuretic: torsemide  60 mg daily  Beta-Blocker: carvedilol  12.5 mg BID ACEI/ARB/ARNI: Entresto  97/103 mg BID MRA: spironolactone  25 mg daily  SGLT2i: Farxiga  10 mg daily  Other: atorvastatin  40 mg daily, digoxin  0.0625 mg daily   Assessment: 1.  Acute on chronic systolic heart failure (LVEF 25-30%)  , due to previous cocaine use and uncontrolled HTN. NYHA class III symptoms.   -Symptoms: Reports SOB, reports feeling fatigue, no appetite, denies swelling, reports right foot pain. No substantial improvement in symptoms. -Volume: Appears hypervolemic, dark yellow urine. Reports good urine output, though this is poorly documented. Current furosemide  dose furosemide  60 mg IV q12h. May require dose increase tomorrow. -Hemodynamics: BP 131-149/69-100, HR 70s-90s -BB: Continue carvedilol  12.5 mg BID  -ACEI/ARB/ARNI: Continue Entresto  97/103 mg BID. Patient reports being out of Entresto  97/103 mg.  -MRA: Continue spironolactone  25 mg daily  -SGLT2i: Continue Farxiga  10 mg daily. No symptoms of UTI. Patient reports being out of Farxiga  10 mg.  -Given persistent HTN, patient may benefit froim the addition of BiDil  0.5 tablet TID.  Plan: 1) Medication changes recommended at this time: -Consider adding BiDil  0.5 tablet three times daily  2) Patient assistance: -Pending   3) Education: - Patient has been educated on current HF medications and potential additions to HF medication regimen - Patient verbalizes understanding that over the next few months, these medication doses may change and more medications may be added to optimize HF regimen - Patient has been educated on basic disease state pathophysiology and goals of therapy    Harlene Benders, [place] PharmD Candidate   Please do  not hesitate to reach out with questions or concerns,  Jaun Bash, PharmD, CPP, BCPS, [facility] Heart Failure Pharmacist  Phone - [phone] 05/16/2024 1:19 PM

## 2024-05-16 NOTE — ED Notes (Signed)
 Meds given  pt refused lovenox .

## 2024-05-16 NOTE — ED Notes (Signed)
 Patient ambulatory to use restroom. Patient maintains even and steady gait.

## 2024-05-17 DIAGNOSIS — R0602 Shortness of breath: Secondary | ICD-10-CM | POA: Diagnosis not present

## 2024-05-17 DIAGNOSIS — I25118 Atherosclerotic heart disease of native coronary artery with other forms of angina pectoris: Secondary | ICD-10-CM

## 2024-05-17 DIAGNOSIS — E1122 Type 2 diabetes mellitus with diabetic chronic kidney disease: Secondary | ICD-10-CM | POA: Diagnosis not present

## 2024-05-17 DIAGNOSIS — I5023 Acute on chronic systolic (congestive) heart failure: Secondary | ICD-10-CM | POA: Diagnosis not present

## 2024-05-17 DIAGNOSIS — J810 Acute pulmonary edema: Secondary | ICD-10-CM | POA: Diagnosis not present

## 2024-05-17 LAB — CBG MONITORING, ED: Glucose-Capillary: 157 mg/dL — ABNORMAL HIGH (ref 70–99)

## 2024-05-17 LAB — BASIC METABOLIC PANEL WITH GFR
Anion gap: 8 (ref 5–15)
BUN: 20 mg/dL (ref 6–20)
CO2: 30 mmol/L (ref 22–32)
Calcium: 6.6 mg/dL — ABNORMAL LOW (ref 8.9–10.3)
Chloride: 98 mmol/L (ref 98–111)
Creatinine, Ser: 1.41 mg/dL — ABNORMAL HIGH (ref 0.44–1.00)
GFR, Estimated: 48 mL/min — ABNORMAL LOW
Glucose, Bld: 187 mg/dL — ABNORMAL HIGH (ref 70–99)
Potassium: 4.4 mmol/L (ref 3.5–5.1)
Sodium: 136 mmol/L (ref 135–145)

## 2024-05-17 LAB — CBC
HCT: 37.5 % (ref 36.0–46.0)
Hemoglobin: 11.5 g/dL — ABNORMAL LOW (ref 12.0–15.0)
MCH: 29.2 pg (ref 26.0–34.0)
MCHC: 30.7 g/dL (ref 30.0–36.0)
MCV: 95.2 fL (ref 80.0–100.0)
Platelets: 289 K/uL (ref 150–400)
RBC: 3.94 MIL/uL (ref 3.87–5.11)
RDW: 14.9 % (ref 11.5–15.5)
WBC: 5.2 K/uL (ref 4.0–10.5)
nRBC: 0 % (ref 0.0–0.2)

## 2024-05-17 LAB — MAGNESIUM: Magnesium: 1.9 mg/dL (ref 1.7–2.4)

## 2024-05-17 MED ORDER — OXYCODONE HCL 5 MG PO TABS
5.0000 mg | ORAL_TABLET | ORAL | Status: DC | PRN
  Administered 2024-05-17 – 2024-05-23 (×7): 5 mg via ORAL
  Filled 2024-05-17 (×7): qty 1

## 2024-05-17 NOTE — Progress Notes (Addendum)
 "  Cardiology Progress Note   Patient Name: Molly Howard Date of Encounter: 05/17/2024  Primary Cardiologist: Redell Cave, MD  Subjective   Breathing somewhat improved.  Abd still feels full/firm.  Notes good UO.  I/O inaccurate.  No chest pain.   Objective   Inpatient Medications    Scheduled Meds:  aspirin  EC  81 mg Oral Daily   atorvastatin   40 mg Oral Daily   carvedilol   12.5 mg Oral BID WC   clopidogrel   75 mg Oral Daily   dapagliflozin  propanediol  10 mg Oral Daily   digoxin   0.0625 mg Oral Daily   enoxaparin  (LOVENOX ) injection  0.5 mg/kg Subcutaneous Q24H   ferrous sulfate   325 mg Oral BID   furosemide   60 mg Intravenous Q12H   sacubitril -valsartan   1 tablet Oral BID   spironolactone   25 mg Oral Daily   Continuous Infusions:  PRN Meds: acetaminophen  **OR** acetaminophen , guaiFENesin -dextromethorphan , magnesium  hydroxide, ondansetron  **OR** ondansetron  (ZOFRAN ) IV, oxyCODONE , traZODone    Vital Signs    Vitals:   05/17/24 0628 05/17/24 0923 05/17/24 0925 05/17/24 0932  BP:  121/80 121/80   Pulse:  65 65   Resp:  19    Temp:      TempSrc:      SpO2:  100%  98%  Weight: 117 kg     Height: 5' 7 (1.702 m)      No intake or output data in the 24 hours ending 05/17/24 0941 Filed Weights   05/14/24 1858 05/17/24 0628  Weight: 117.9 kg 117 kg    Physical Exam   GEN: Well nourished, well developed, in no acute distress.  HEENT: Grossly normal.  Neck: Supple, no carotid bruits or masses.  JVD to jaw. Cardiac: RRR, no murmurs, rubs, or gallops. No clubbing, cyanosis, 2+ bilat LE woody edema to the lower posterior thighs.  Radials 2+, DP/PT 2+ and equal bilaterally.  Respiratory:  Respirations regular and unlabored, clear to auscultation bilaterally. GI: semi-firm, protuberant, nontender, BS + x 4. MS: no deformity or atrophy. Skin: warm and dry, no rash. Neuro:  Strength and sensation are intact. Psych: AAOx3.  Normal affect.  Labs     Chemistry Recent Labs  Lab 05/14/24 1917 05/16/24 0802 05/17/24 0511  NA 138 141 136  K 3.6 3.5 4.4  CL 100 102 98  CO2 27 31 30   GLUCOSE 245* 131* 187*  Howard 21* 20 20  CREATININE 1.28* 1.33* 1.41*  CALCIUM  6.8* 6.8* 6.6*  PROT  --  5.3*  --   ALBUMIN   --  2.2*  --   AST  --  13*  --   ALT  --  6  --   ALKPHOS  --  112  --   BILITOT  --  0.7  --   GFRNONAA 54* 52* 48*  ANIONGAP 11 9 8      Hematology Recent Labs  Lab 05/14/24 1917 05/16/24 0802 05/17/24 0511  WBC 6.8 4.9 5.2  RBC 4.01 3.90 3.94  HGB 11.6* 11.3* 11.5*  HCT 37.5 35.9* 37.5  MCV 93.5 92.1 95.2  MCH 28.9 29.0 29.2  MCHC 30.9 31.5 30.7  RDW 15.1 15.2 14.9  PLT 303 271 289    Cardiac Enzymes  Recent Labs  Lab 05/14/24 1917 05/14/24 2341  TRNPT 67* 64*  ]  BNP    Component Value Date/Time   BNP 2,362.6 (H) 01/04/2024 1545    ProBNP    Component Value Date/Time   PROBNP 8,096.0 (  H) 05/14/2024 1917    Lipids  Lab Results  Component Value Date   CHOL 210 (H) 02/21/2024   HDL 59 02/21/2024   LDLCALC 130 (H) 02/21/2024   TRIG 107 02/21/2024   CHOLHDL 3.6 02/21/2024    HbA1c  Lab Results  Component Value Date   HGBA1C 8.4 (H) 01/16/2024    Radiology    DG Foot Complete Right Result Date: 05/14/2024 EXAM: 3 OR MORE VIEW(S) XRAY OF THE RIGHT FOOT 05/14/2024 07:45:00 PM COMPARISON: None available. CLINICAL HISTORY: SOB FINDINGS: BONES AND JOINTS: No acute fracture. No malalignment. SOFT TISSUES: Vascular calcifications. IMPRESSION: 1. No acute findings. Electronically signed by: Oneil Devonshire MD MD 05/14/2024 08:35 PM EST RP Workstation: [id number]   DG Chest 2 View Result Date: 05/14/2024 EXAM: 2 VIEW(S) XRAY OF THE CHEST 05/14/2024 07:45:00 PM COMPARISON: 04/12/2024 CLINICAL HISTORY: SOB FINDINGS: LUNGS AND PLEURA: Left midlung zone linear scarring. Pulmonary edema and interstitial markings. No pleural effusion. No pneumothorax. HEART AND MEDIASTINUM: Cardiomegaly. Cardiac  defibrillator in place. BONES AND SOFT TISSUES: No acute osseous abnormality. IMPRESSION: 1. Pulmonary edema and increased interstitial markings. 2. Cardiomegaly with cardiac defibrillator in place. Electronically signed by: Oneil Devonshire MD MD 05/14/2024 08:34 PM EST RP Workstation: [id number]     Telemetry    Sinus rhythm PVCs- Personally Reviewed  Cardiac Studies   2D Echocardiogram 4.2025   1. Left ventricular ejection fraction, by estimation, is 30 to 35%. The  left ventricle has moderate to severely decreased function. The left  ventricle demonstrates global hypokinesis. There is mild left ventricular  hypertrophy. Left ventricular  diastolic parameters are indeterminate.   2. Right ventricular systolic function is low normal. The right  ventricular size is normal.   3. The mitral valve is normal in structure. Mild mitral valve  regurgitation.   4. Tricuspid valve regurgitation is mild to moderate.   5. The aortic valve is tricuspid. Aortic valve regurgitation is not  visualized.   6. The inferior vena cava is normal in size with <50% respiratory  variability, suggesting right atrial pressure of 8 mmHg.   Patient Profile     39 y.o. female with a history of chronic HFrEF, nonischemic cardiomyopathy, hypertension, stage II chronic kidney disease, diabetes, GERD, and polysubstance abuse, who was admitted January 12 with recurrent heart failure in the setting of noncompliance.  Assessment & Plan    1.  Acute on chronic HFrEF/nonischemic cardiomyopathy: Patient admitted with progressive volume overload, dyspnea, proBNP of 8000, chest x-ray with pulmonary edema in the setting of noncompliance.  Difficulty affording medications and follow-up office copayments in the outpatient setting.  She has responded well following resumption of home medications and initiation of intravenous diuresis.  I's and O's are inaccurate however she notes good urine output.  She remains volume overloaded on  examination.  Howard and bicarb stable with mild elevation of creatinine this morning at 1.41.  Continue beta-blocker, Entresto , spironolactone , digoxin , and Farxiga .  Continue Lasix  60 IV twice daily.  Follow-up basic metabolic panel in the a.m.  2.  Demand ischemia: Mild troponin elevation to 67  64.  No chest pain.  Nonischemic Myoview  in 2021.  No plans for ischemic evaluation at this time.  Continue aspirin , statin, beta-blocker, and antiplatelet therapy.  3.  Primary hypertension: Stable.  4.  Hyperlipidemia: LDL 130 in October in the setting of noncompliance with statin at home.  Continue statin while inpatient.  5.  Type 2 diabetes mellitus: A1c of 8.4 in September.  Per medicine.  6.  History of substance abuse: Urine drug screen negative this admission.  7.  History of stroke: On dual antiplatelet therapy.  Signed, Lonni Meager, NP  05/17/2024, 9:41 AM    For questions or updates, please contact   Please consult [email or web address] for contact info under Cardiology/STEMI.  "

## 2024-05-17 NOTE — Progress Notes (Addendum)
 Heart Failure Stewardship Pharmacy Note  PCP: [name], DO PCP-Cardiologist: Redell Cave, MD  HPI: Molly Howard is a 39 y.o. female with a history of DM, HTN, previous drug use, NSVT, PSVT, mod/severe TR, CKD, CVS 10/25, anemia and chronic biventricular heart failure who presented with SOB and right foot pain.   On admission, proBNP was 8096, HS-troponin was 64, Scr 1.28, K 3.6, Ca 6.8. Chest XR on admission noted pulmonary edema and increased interstitial markings.   Pertinent cardiac history:  Echo 10/11/20: EF 30-35% along with moderate TR.  Echo 03/20/21: EF 25-30% along with severely elevated PA pressure of 70.8 mmHg, mild LAE, mild MR and mild/moderate TR Cardiac MRI 05/19/21:  Mild LV dilatation, mild hypertrophy, and mild systolic dysfunction (EF 42%), no LGE, normal RV size and systolic function (EF 50%). Echo 07/25/22: EF 25-30% with moderately elevated PA pressure of 50.7 mmHg, mild LAE, mild MR, small pericardial effusion and moderate/ severe TR  Echo with bubble study 02/21/24: EF 25-30%, moderate LVH, G3DD, abnormal GLS of -4.2%, RV moderately reduced, mod/severe TR, no interatrial shunt   Pertinent Lab Values: Creat  Date Value Ref Range Status  01/16/2024 1.30 (H) 0.50 - 0.97 mg/dL Final   Creatinine, Ser  Date Value Ref Range Status  05/17/2024 1.41 (H) 0.44 - 1.00 mg/dL Final   BUN  Date Value Ref Range Status  05/17/2024 20 6 - 20 mg/dL Final  97/89/7974 15 6 - 20 mg/dL Final   Potassium  Date Value Ref Range Status  05/17/2024 4.4 3.5 - 5.1 mmol/L Final   Sodium  Date Value Ref Range Status  05/17/2024 136 135 - 145 mmol/L Final  06/13/2023 142 134 - 144 mmol/L Final   B Natriuretic Peptide  Date Value Ref Range Status  01/04/2024 2,362.6 (H) 0.0 - 100.0 pg/mL Final    Comment:    Performed at [facility], [address], [place], [place] [address]   Magnesium   Date Value Ref Range Status  05/17/2024 1.9 1.7 - 2.4 mg/dL Final     Comment:    Performed at [facility], [address] [place], [place] [address]   Hgb A1c MFr Bld  Date Value Ref Range Status  01/16/2024 8.4 (H) <5.7 % Final    Comment:    For someone without known diabetes, a hemoglobin A1c value of 6.5% or greater indicates that they may have  diabetes and this should be confirmed with a follow-up  test. . For someone with known diabetes, a value <7% indicates  that their diabetes is well controlled and a value  greater than or equal to 7% indicates suboptimal  control. A1c targets should be individualized based on  duration of diabetes, age, comorbid conditions, and  other considerations. . Currently, no consensus exists regarding use of hemoglobin A1c for diagnosis of diabetes for children. .    Digoxin  Level  Date Value Ref Range Status  01/11/2024 0.9 0.8 - 2.0 ng/mL Final    Comment:    Performed at [facility], [address] [place], [place] [address]   TSH  Date Value Ref Range Status  11/09/2021 5.114 (H) 0.350 - 4.500 uIU/mL Final    Comment:    Performed by a 3rd Generation assay with a functional sensitivity of <=0.01 uIU/mL. Performed at [facility], [address] [place], [place] [address]    LDH  Date Value Ref Range Status  04/15/2023 216 (H) 98 - 192 U/L Final  Comment:    Performed at [facility], [address] [place], [place] [address]    Vital Signs:  Temp:  [97.9 F (36.6 C)-98.5 F (36.9 C)] 97.9 F (36.6 C) (01/15 0522) Pulse Rate:  [64-96] 68 (01/15 0520) Cardiac Rhythm: Normal sinus rhythm (01/14 2021) Resp:  [16-27] 20 (01/15 0520) BP: (112-149)/(71-100) 127/95 (01/15 0520) SpO2:  [88 %-99 %] 99 % (01/15 0520) Weight:  [117 kg (257 lb 15 oz)] 117 kg (257 lb 15 oz) (01/15 0628) No intake or output data in the 24 hours ending 05/17/24 0834  Current Heart Failure Medications:  Loop diuretic: furosemide  60 mg IV q12h  Beta-Blocker:  carvedilol  12.5 mg BID ACEI/ARB/ARNI: Entresto  97/103 mg BID  MRA: spironolactone  25 mg daily  SGLT2i: Farxiga  10 mg daily  Other:atorvastatin  40 mg daily, digoxin  0.0625 mg daily   Prior to admission Heart Failure Medications:  Loop diuretic: torsemide  60 mg daily  Beta-Blocker: carvedilol  12.5 mg BID  ACEI/ARB/ARNI: Entresto  97/103 mg BID  MRA: spironolactone  25 mg daily  SGLT2i: Farxiga  10 mg daily  Other:atorvastatin  40 mg daily, digoxin  0.0625 mg daily   Assessment: 1.   Acute on chronic systolic heart failure (LVEF 25-30%)  , due to previous cocaine use and uncontrolled HTN. NYHA class III symptoms.   -Symptoms: Reports feeling mildly improved from yesterday, reports poor appetite, mild swelling, and right foot pain  -Volume: Appears hypervolemic, large clear urine output, LLE. Continue furosemide  dose 60 mg IV q12h. Can consider adding metolazone  5 mg daily to increase total diuresis.  -Hemodynamics: BP 112-149/71-100, HR 60s-90s -BB: Continue carvedilol  12.5 mg BID  -ACEI/ARB/ARNI: Continue Entresto  97/103 mg BID  -MRA: Continue spironolactone  25 mg daily  -SGLT2i: Continue Farxiga  10 mg daily   Plan: 1) Medication changes recommended at this time: -None at this time  2) Patient assistance: -Pending  3) Education: - Patient has been educated on current HF medications and potential additions to HF medication regimen - Patient verbalizes understanding that over the next few months, these medication doses may change and more medications may be added to optimize HF regimen - Patient has been educated on basic disease state pathophysiology and goals of therapy   Harlene Benders, [place] PharmD Candidate   Please do not hesitate to reach out with questions or concerns,  Jaun Bash, PharmD, CPP, BCPS, [facility] Heart Failure Pharmacist  Phone - [phone] 05/17/2024 2:59 PM

## 2024-05-17 NOTE — Progress Notes (Signed)
 " PROGRESS NOTE    Molly Howard  FMW:[id number] DOB: 30-Aug-1985 DOA: 05/14/2024 PCP: [name], DO    Brief Narrative:  39 y.o. African-American female with medical history significant for GERD, chronic HFrEF, type diabetes mellitus, CKD stage 3a, nonischemic cardiomyopathy, polysubstance abuse and CVA, who presented to the emergency room with acute onset of worsening dyspnea over the last couple of days with associated orthopnea, paroxysmal nocturnal dyspnea and lower extremity edema.  She admitted to dry cough and wheezing.  She has been having dyspnea on exertion.  No fever or chills.  No nausea or vomiting or abdominal pain.  No dysuria, oliguria or hematuria or flank pain. She has reportedly been unable to    ED Course: BP was 174/116 with otherwise normal vital signs.  Labs revealed creatinine of 1.28 close to previous levels and calcium  6.8 with a BUN at 21.  proBNP was 8096.  High sensitive troponin T was 67 and later 64.  CBC showed hemoglobin 11.6 hematocrit 37.5. ZXH:dyntzi sinus rhythm at a rate of 74 with premature supraventricular complexes and possible left atrial enlargement and Q waves anteroseptally with T wave inversion inferolaterally. Imaging: 2 view chest x-ray showed pulm edema and increased interstitial markings as well as cardiomegaly with AICD in place.  Right foot x-ray showed no acute findings.   The patient was given 120 mg of IV Lasix  and admitted for CHF exacerbation.   Assessment and Plan:  Acute on Chronic HFrEF - Presented with dyspnea with elevated ProBNP and CXR findings of pulmonary edema   - TTE (02/2024) showed LVEF 25-30% with G3DD, mod reduced RV function, mod to severe TR - Has been out of home meds, unable to afford refills - Cardiology following - Continue Coreg  12.5 mg BID, Farxiga  10 mg, aldactone  25 mg, Entresto  97-103 mg BID, digoxin  0.0625 mg daily - Continue IV Lasix  60 mg BID - Strict ins and outs, not accurately being collected as  patient is going to bathroom in ED, not bedside urinal  Hypertensive urgency - resolved - BP elevated to 174/116 on admission - Possibly contributing to acute CHF exacerbation - Resolved with resumption of home meds  Elevated troponins - Likely secondary to demand ischemia  - No chest pain currently - Cardiology indicating no plans for ischemic workup   CKD stage 3a - Stable at baseline  CAD - Continue home aspirin  and plavix   Dyslipidemia - Continue home statin    Scheduled Meds:  aspirin  EC  81 mg Oral Daily   atorvastatin   40 mg Oral Daily   carvedilol   12.5 mg Oral BID WC   clopidogrel   75 mg Oral Daily   dapagliflozin  propanediol  10 mg Oral Daily   digoxin   0.0625 mg Oral Daily   enoxaparin  (LOVENOX ) injection  0.5 mg/kg Subcutaneous Q24H   ferrous sulfate   325 mg Oral BID   furosemide   60 mg Intravenous Q12H   sacubitril -valsartan   1 tablet Oral BID   spironolactone   25 mg Oral Daily   Continuous Infusions: PRN Meds:.acetaminophen  **OR** acetaminophen , guaiFENesin -dextromethorphan , magnesium  hydroxide, ondansetron  **OR** ondansetron  (ZOFRAN ) IV, oxyCODONE , traZODone   Current Outpatient Medications  Medication Instructions   Accu-Chek Softclix Lancets lancets Use as instructed   Aspirin  Low Dose 81 mg, Oral, Daily, Swallow whole.   atorvastatin  (LIPITOR) 40 mg, Oral, Daily   carvedilol  (COREG ) 12.5 mg, Oral, 2 times daily with meals   clopidogrel  (PLAVIX ) 75 mg, Oral, Daily   Continuous Glucose Sensor (FREESTYLE LIBRE 3 SENSOR) MISC Place  1 sensor on the skin every 14 days. Use to check glucose continuously   dapagliflozin  propanediol (FARXIGA ) 10 MG TABS tablet Take 1 tablet (10 mg total) by mouth once daily.   digoxin  (LANOXIN ) 0.0625 mg, Oral, Daily   FeroSul 325 mg, Oral, 2 times daily   Furoscix  80 mg, Subcutaneous, As directed   Ozempic  (0.25 or 0.5 MG/DOSE) 0.25 mg, Subcutaneous, Weekly   sacubitril -valsartan  (ENTRESTO ) 97-103 MG 1 tablet, Oral, 2 times  daily   spironolactone  (ALDACTONE ) 25 mg, Oral, Daily   torsemide  (DEMADEX ) 60 mg, Oral, Daily    DVT prophylaxis:    Code Status:   Code Status: Full Code  Family Communication: None  Disposition Plan: Likely home pending clinical improvement PT -   -   OT -   -    Level of care: Telemetry  Consultants:  Cardiology  Subjective:  Examined at bedside. Reports feeling somewhat better than admission but not improved from yesterday. States she has no appetite. Right foot is hurting.   Objective: Vitals:   05/17/24 1020 05/17/24 1200 05/17/24 1400 05/17/24 1422  BP:  120/67 131/84 114/78  Pulse:  60 61 60  Resp:  17 18 17   Temp: 97.8 F (36.6 C)   98 F (36.7 C)  TempSrc: Oral   Oral  SpO2:  95% 97% 96%  Weight:      Height:       No intake or output data in the 24 hours ending 05/17/24 1435 Filed Weights   05/14/24 1858 05/17/24 0628  Weight: 117.9 kg 117 kg    Examination:  Gen: NAD, A&Ox3 Neck: Supple CV: RRR, no murmurs Resp: normal WOB, CTAB, no wheezing or rhonchi Abd: Soft, NTND, no guarding, BS normoactive Ext: 1+  LE edema Skin: Warm, dry, no rashes/lesions Neuro: CN II-XII grossly intact, strength 5/5 b/l, sensation intact, normal gait Psych: Calm, cooperative, appropriate affect    Data Reviewed: I have personally reviewed following labs and imaging studies  CBC: Recent Labs  Lab 05/14/24 1917 05/16/24 0802 05/17/24 0511  WBC 6.8 4.9 5.2  HGB 11.6* 11.3* 11.5*  HCT 37.5 35.9* 37.5  MCV 93.5 92.1 95.2  PLT 303 271 289   Basic Metabolic Panel: Recent Labs  Lab 05/14/24 1917 05/16/24 0802 05/17/24 0511  NA 138 141 136  K 3.6 3.5 4.4  CL 100 102 98  CO2 27 31 30   GLUCOSE 245* 131* 187*  BUN 21* 20 20  CREATININE 1.28* 1.33* 1.41*  CALCIUM  6.8* 6.8* 6.6*  MG  --  1.6* 1.9   GFR: Estimated Creatinine Clearance: 70.9 mL/min (A) (by C-G formula based on SCr of 1.41 mg/dL (H)). Liver Function Tests: Recent Labs  Lab  05/16/24 0802  AST 13*  ALT 6  ALKPHOS 112  BILITOT 0.7  PROT 5.3*  ALBUMIN  2.2*   No results for input(s): LIPASE, AMYLASE in the last 168 hours. No results for input(s): AMMONIA in the last 168 hours. Coagulation Profile: No results for input(s): INR, PROTIME in the last 168 hours. Cardiac Enzymes: No results for input(s): CKTOTAL, CKMB, CKMBINDEX, TROPONINI in the last 168 hours. BNP (last 3 results) Recent Labs    04/20/24 1317 05/14/24 1917  PROBNP 6,696.0* 8,096.0*   HbA1C: No results for input(s): HGBA1C in the last 72 hours. CBG: Recent Labs  Lab 05/17/24 0745  GLUCAP 157*   Lipid Profile: No results for input(s): CHOL, HDL, LDLCALC, TRIG, CHOLHDL, LDLDIRECT in the last 72 hours. Thyroid  Function Tests: No  results for input(s): TSH, T4TOTAL, FREET4, T3FREE, THYROIDAB in the last 72 hours. Anemia Panel: No results for input(s): VITAMINB12, FOLATE, FERRITIN, TIBC, IRON , RETICCTPCT in the last 72 hours. Sepsis Labs: No results for input(s): PROCALCITON, LATICACIDVEN in the last 168 hours.  No results found for this or any previous visit (from the past 240 hours).   Radiology Studies: No results found.   Scheduled Meds:  aspirin  EC  81 mg Oral Daily   atorvastatin   40 mg Oral Daily   carvedilol   12.5 mg Oral BID WC   clopidogrel   75 mg Oral Daily   dapagliflozin  propanediol  10 mg Oral Daily   digoxin   0.0625 mg Oral Daily   enoxaparin  (LOVENOX ) injection  0.5 mg/kg Subcutaneous Q24H   ferrous sulfate   325 mg Oral BID   furosemide   60 mg Intravenous Q12H   sacubitril -valsartan   1 tablet Oral BID   spironolactone   25 mg Oral Daily   Continuous Infusions:   Unresulted Labs (From admission, onward)     Start     Ordered   05/18/24 0500  CBC  Tomorrow morning,   R        05/17/24 1438   05/18/24 0500  Basic metabolic panel with GFR  Tomorrow morning,   R        05/17/24 1438              LOS:  LOS: 2 days   Time Spent: 45 minutes  Cheri Ayotte Al-Sultani, MD [facility]  If 7PM-7AM, please contact night-coverage  05/17/2024, 2:35 PM      "

## 2024-05-17 NOTE — ED Notes (Signed)
 Pt with sustained SpO2 88-90% while asleep. Placed on 2 L [place]

## 2024-05-17 NOTE — ED Notes (Signed)
 Patient ambulatory to restroom independently without complication.

## 2024-05-18 ENCOUNTER — Other Ambulatory Visit (HOSPITAL_COMMUNITY): Payer: Self-pay

## 2024-05-18 ENCOUNTER — Telehealth (HOSPITAL_COMMUNITY): Payer: Self-pay

## 2024-05-18 DIAGNOSIS — Z91148 Patient's other noncompliance with medication regimen for other reason: Secondary | ICD-10-CM | POA: Diagnosis not present

## 2024-05-18 DIAGNOSIS — I428 Other cardiomyopathies: Secondary | ICD-10-CM | POA: Diagnosis not present

## 2024-05-18 DIAGNOSIS — I509 Heart failure, unspecified: Secondary | ICD-10-CM | POA: Diagnosis not present

## 2024-05-18 DIAGNOSIS — I5023 Acute on chronic systolic (congestive) heart failure: Secondary | ICD-10-CM | POA: Diagnosis not present

## 2024-05-18 DIAGNOSIS — I2489 Other forms of acute ischemic heart disease: Secondary | ICD-10-CM | POA: Diagnosis not present

## 2024-05-18 DIAGNOSIS — I5020 Unspecified systolic (congestive) heart failure: Secondary | ICD-10-CM | POA: Diagnosis not present

## 2024-05-18 DIAGNOSIS — Z8673 Personal history of transient ischemic attack (TIA), and cerebral infarction without residual deficits: Secondary | ICD-10-CM | POA: Diagnosis not present

## 2024-05-18 LAB — CBC
HCT: 36.8 % (ref 36.0–46.0)
Hemoglobin: 11.3 g/dL — ABNORMAL LOW (ref 12.0–15.0)
MCH: 29 pg (ref 26.0–34.0)
MCHC: 30.7 g/dL (ref 30.0–36.0)
MCV: 94.4 fL (ref 80.0–100.0)
Platelets: 305 K/uL (ref 150–400)
RBC: 3.9 MIL/uL (ref 3.87–5.11)
RDW: 15.1 % (ref 11.5–15.5)
WBC: 4.7 K/uL (ref 4.0–10.5)
nRBC: 0 % (ref 0.0–0.2)

## 2024-05-18 LAB — BASIC METABOLIC PANEL WITH GFR
Anion gap: 9 (ref 5–15)
BUN: 22 mg/dL — ABNORMAL HIGH (ref 6–20)
CO2: 31 mmol/L (ref 22–32)
Calcium: 7.2 mg/dL — ABNORMAL LOW (ref 8.9–10.3)
Chloride: 98 mmol/L (ref 98–111)
Creatinine, Ser: 1.41 mg/dL — ABNORMAL HIGH (ref 0.44–1.00)
GFR, Estimated: 48 mL/min — ABNORMAL LOW
Glucose, Bld: 203 mg/dL — ABNORMAL HIGH (ref 70–99)
Potassium: 4.5 mmol/L (ref 3.5–5.1)
Sodium: 138 mmol/L (ref 135–145)

## 2024-05-18 NOTE — Progress Notes (Signed)
 " PROGRESS NOTE    Molly Howard  FMW:[id number] DOB: Apr 19, 1986 DOA: 05/14/2024 PCP: [name], DO    Brief Narrative:  39 y.o. African-American female with medical history significant for GERD, chronic HFrEF, type diabetes mellitus, CKD stage 3a, nonischemic cardiomyopathy, polysubstance abuse and CVA, who presented to the emergency room with acute onset of worsening dyspnea over the last couple of days with associated orthopnea, paroxysmal nocturnal dyspnea and lower extremity edema.  She admitted to dry cough and wheezing.  She has been having dyspnea on exertion.  No fever or chills.  No nausea or vomiting or abdominal pain.  No dysuria, oliguria or hematuria or flank pain. She has reportedly been unable to    ED Course: BP was 174/116 with otherwise normal vital signs.  Labs revealed creatinine of 1.28 close to previous levels and calcium  6.8 with a BUN at 21.  proBNP was 8096.  High sensitive troponin T was 67 and later 64.  CBC showed hemoglobin 11.6 hematocrit 37.5. ZXH:dyntzi sinus rhythm at a rate of 74 with premature supraventricular complexes and possible left atrial enlargement and Q waves anteroseptally with T wave inversion inferolaterally. Imaging: 2 view chest x-ray showed pulm edema and increased interstitial markings as well as cardiomegaly with AICD in place.  Right foot x-ray showed no acute findings.   The patient was given 120 mg of IV Lasix  and admitted for CHF exacerbation.   Assessment and Plan:  Acute on Chronic HFrEF - Presented with dyspnea with elevated ProBNP and CXR findings of pulmonary edema   - TTE (02/2024) showed LVEF 25-30% with G3DD, mod reduced RV function, mod to severe TR - Has been out of home meds, unable to afford refills - Cardiology following - Continue Coreg  12.5 mg BID, Farxiga  10 mg, aldactone  25 mg, Entresto  97-103 mg BID, digoxin  0.0625 mg daily - Continue IV Lasix  60 mg BID  Hypertensive urgency - resolved - BP elevated to  174/116 on admission - Possibly contributing to acute CHF exacerbation - Resolved with resumption of home meds  Elevated troponins - Likely secondary to demand ischemia  - No chest pain currently - Cardiology indicating no plans for ischemic workup   CKD stage 3a - Stable at baseline  CAD - Continue home aspirin  and plavix   Dyslipidemia - Continue home statin    Scheduled Meds:  aspirin  EC  81 mg Oral Daily   atorvastatin   40 mg Oral Daily   carvedilol   12.5 mg Oral BID WC   clopidogrel   75 mg Oral Daily   dapagliflozin  propanediol  10 mg Oral Daily   digoxin   0.0625 mg Oral Daily   enoxaparin  (LOVENOX ) injection  0.5 mg/kg Subcutaneous Q24H   ferrous sulfate   325 mg Oral BID   furosemide   60 mg Intravenous Q12H   sacubitril -valsartan   1 tablet Oral BID   spironolactone   25 mg Oral Daily   Continuous Infusions: PRN Meds:.acetaminophen  **OR** acetaminophen , guaiFENesin -dextromethorphan , magnesium  hydroxide, ondansetron  **OR** ondansetron  (ZOFRAN ) IV, oxyCODONE , traZODone   Current Outpatient Medications  Medication Instructions   Accu-Chek Softclix Lancets lancets Use as instructed   Aspirin  Low Dose 81 mg, Oral, Daily, Swallow whole.   atorvastatin  (LIPITOR) 40 mg, Oral, Daily   carvedilol  (COREG ) 12.5 mg, Oral, 2 times daily with meals   clopidogrel  (PLAVIX ) 75 mg, Oral, Daily   Continuous Glucose Sensor (FREESTYLE LIBRE 3 SENSOR) MISC Place 1 sensor on the skin every 14 days. Use to check glucose continuously   dapagliflozin  propanediol (FARXIGA ) 10 MG  TABS tablet Take 1 tablet (10 mg total) by mouth once daily.   digoxin  (LANOXIN ) 0.0625 mg, Oral, Daily   FeroSul 325 mg, Oral, 2 times daily   Furoscix  80 mg, Subcutaneous, As directed   Ozempic  (0.25 or 0.5 MG/DOSE) 0.25 mg, Subcutaneous, Weekly   sacubitril -valsartan  (ENTRESTO ) 97-103 MG 1 tablet, Oral, 2 times daily   spironolactone  (ALDACTONE ) 25 mg, Oral, Daily   torsemide  (DEMADEX ) 60 mg, Oral, Daily    DVT  prophylaxis:    Code Status:   Code Status: Full Code  Family Communication: None  Disposition Plan: Likely home pending clinical improvement  Level of care: Telemetry  Consultants:  Cardiology  Subjective:  Examined at bedside. Asleep, woke up but groggy. Stated she was doing well but tired.   Objective: Vitals:   05/18/24 0812 05/18/24 1400 05/18/24 1924 05/18/24 1944  BP: (!) 134/90 (!) 137/95 119/77 (!) 140/97  Pulse: 66 71 69 70  Resp: 16 18 18 14   Temp: 97.6 F (36.4 C) 97.8 F (36.6 C) 98.4 F (36.9 C) 97.6 F (36.4 C)  TempSrc: Oral Oral Oral   SpO2: 100% 100% 100% 100%  Weight:    117.2 kg  Height:    5' 7 (1.702 m)   No intake or output data in the 24 hours ending 05/18/24 2306 Filed Weights   05/14/24 1858 05/17/24 0628 05/18/24 1944  Weight: 117.9 kg 117 kg 117.2 kg    Examination:  Gen: NAD, A&Ox3 Neck: Supple CV: RRR, no murmurs Resp: normal WOB, CTAB, no wheezing or rhonchi Abd: Soft, NTND, no guarding, BS normoactive Ext: 1+  LE edema Skin: Warm, dry, no rashes/lesions Neuro: CN II-XII grossly intact, strength 5/5 b/l, sensation intact, normal gait Psych: Calm, cooperative, appropriate affect    Data Reviewed: I have personally reviewed following labs and imaging studies  CBC: Recent Labs  Lab 05/14/24 1917 05/16/24 0802 05/17/24 0511 05/18/24 0601  WBC 6.8 4.9 5.2 4.7  HGB 11.6* 11.3* 11.5* 11.3*  HCT 37.5 35.9* 37.5 36.8  MCV 93.5 92.1 95.2 94.4  PLT 303 271 289 305   Basic Metabolic Panel: Recent Labs  Lab 05/14/24 1917 05/16/24 0802 05/17/24 0511 05/18/24 0601  NA 138 141 136 138  K 3.6 3.5 4.4 4.5  CL 100 102 98 98  CO2 27 31 30 31   GLUCOSE 245* 131* 187* 203*  BUN 21* 20 20 22*  CREATININE 1.28* 1.33* 1.41* 1.41*  CALCIUM  6.8* 6.8* 6.6* 7.2*  MG  --  1.6* 1.9  --    GFR: Estimated Creatinine Clearance: 70.9 mL/min (A) (by C-G formula based on SCr of 1.41 mg/dL (H)). Liver Function Tests: Recent Labs  Lab  05/16/24 0802  AST 13*  ALT 6  ALKPHOS 112  BILITOT 0.7  PROT 5.3*  ALBUMIN  2.2*   No results for input(s): LIPASE, AMYLASE in the last 168 hours. No results for input(s): AMMONIA in the last 168 hours. Coagulation Profile: No results for input(s): INR, PROTIME in the last 168 hours. Cardiac Enzymes: No results for input(s): CKTOTAL, CKMB, CKMBINDEX, TROPONINI in the last 168 hours. BNP (last 3 results) Recent Labs    04/20/24 1317 05/14/24 1917  PROBNP 6,696.0* 8,096.0*   HbA1C: No results for input(s): HGBA1C in the last 72 hours. CBG: Recent Labs  Lab 05/17/24 0745  GLUCAP 157*   Lipid Profile: No results for input(s): CHOL, HDL, LDLCALC, TRIG, CHOLHDL, LDLDIRECT in the last 72 hours. Thyroid  Function Tests: No results for input(s): TSH, T4TOTAL,  FREET4, T3FREE, THYROIDAB in the last 72 hours. Anemia Panel: No results for input(s): VITAMINB12, FOLATE, FERRITIN, TIBC, IRON , RETICCTPCT in the last 72 hours. Sepsis Labs: No results for input(s): PROCALCITON, LATICACIDVEN in the last 168 hours.  No results found for this or any previous visit (from the past 240 hours).   Radiology Studies: No results found.   Scheduled Meds:  aspirin  EC  81 mg Oral Daily   atorvastatin   40 mg Oral Daily   carvedilol   12.5 mg Oral BID WC   clopidogrel   75 mg Oral Daily   dapagliflozin  propanediol  10 mg Oral Daily   digoxin   0.0625 mg Oral Daily   enoxaparin  (LOVENOX ) injection  0.5 mg/kg Subcutaneous Q24H   ferrous sulfate   325 mg Oral BID   furosemide   60 mg Intravenous Q12H   sacubitril -valsartan   1 tablet Oral BID   spironolactone   25 mg Oral Daily   Continuous Infusions:   Unresulted Labs (From admission, onward)    None        LOS:  LOS: 3 days   Time Spent: 45 minutes  Adenike Shidler Al-Sultani, MD [facility]  If 7PM-7AM, please contact night-coverage  05/18/2024, 11:06 PM      "

## 2024-05-18 NOTE — Telephone Encounter (Signed)
 Pharmacy Patient Advocate Encounter  Insurance verification completed.    The patient is insured through [employer] [place].     Ran test claim for Farxiga  10mg  tablet and the current 30 day co-pay is $4.  Ran test claim for Entresto  24-26mg  tablet and the current 30 day co-pay is $4.  This test claim was processed through [employer]- copay amounts may vary at other pharmacies due to [employer], or as the patient moves through the different stages of their insurance plan.

## 2024-05-18 NOTE — Progress Notes (Signed)
 Heart Failure Stewardship Pharmacy Note  PCP: [name], DO PCP-Cardiologist: Redell Cave, MD  HPI: Molly Howard is a 39 y.o. female with a history of DM, HTN, previous drug use, NSVT, PSVT, mod/severe TR, CKD, CVS 10/25, anemia and chronic biventricular heart failure who presented with SOB and right foot pain.   On admission, proBNP was 8096, HS-troponin was 64, Scr 1.28, K 3.6, Ca 6.8. Chest XR on admission noted pulmonary edema and increased interstitial markings.   Pertinent cardiac history:  Echo 10/11/20: EF 30-35% along with moderate TR.  Echo 03/20/21: EF 25-30% along with severely elevated PA pressure of 70.8 mmHg, mild LAE, mild MR and mild/moderate TR Cardiac MRI 05/19/21:  Mild LV dilatation, mild hypertrophy, and mild systolic dysfunction (EF 42%), no LGE, normal RV size and systolic function (EF 50%). Echo 07/25/22: EF 25-30% with moderately elevated PA pressure of 50.7 mmHg, mild LAE, mild MR, small pericardial effusion and moderate/ severe TR  Echo with bubble study 02/21/24: EF 25-30%, moderate LVH, G3DD, abnormal GLS of -4.2%, RV moderately reduced, mod/severe TR, no interatrial shunt   Pertinent Lab Values: Creat  Date Value Ref Range Status  01/16/2024 1.30 (H) 0.50 - 0.97 mg/dL Final   Creatinine, Ser  Date Value Ref Range Status  05/17/2024 1.41 (H) 0.44 - 1.00 mg/dL Final   BUN  Date Value Ref Range Status  05/17/2024 20 6 - 20 mg/dL Final  97/89/7974 15 6 - 20 mg/dL Final   Potassium  Date Value Ref Range Status  05/17/2024 4.4 3.5 - 5.1 mmol/L Final   Sodium  Date Value Ref Range Status  05/17/2024 136 135 - 145 mmol/L Final  06/13/2023 142 134 - 144 mmol/L Final   B Natriuretic Peptide  Date Value Ref Range Status  01/04/2024 2,362.6 (H) 0.0 - 100.0 pg/mL Final    Comment:    Performed at [facility], [address] [place], [place] [address]   Magnesium   Date Value Ref Range Status  05/17/2024 1.9 1.7 - 2.4 mg/dL Final     Comment:    Performed at [facility], [address] [place], [place] [address]   Hgb A1c MFr Bld  Date Value Ref Range Status  01/16/2024 8.4 (H) <5.7 % Final    Comment:    For someone without known diabetes, a hemoglobin A1c value of 6.5% or greater indicates that they may have  diabetes and this should be confirmed with a follow-up  test. . For someone with known diabetes, a value <7% indicates  that their diabetes is well controlled and a value  greater than or equal to 7% indicates suboptimal  control. A1c targets should be individualized based on  duration of diabetes, age, comorbid conditions, and  other considerations. . Currently, no consensus exists regarding use of hemoglobin A1c for diagnosis of diabetes for children. .    Digoxin  Level  Date Value Ref Range Status  01/11/2024 0.9 0.8 - 2.0 ng/mL Final    Comment:    Performed at [facility], [address] [place], [place] [address]   TSH  Date Value Ref Range Status  11/09/2021 5.114 (H) 0.350 - 4.500 uIU/mL Final    Comment:    Performed by a 3rd Generation assay with a functional sensitivity of <=0.01 uIU/mL. Performed at [facility], [address] [place], [place] [address]    LDH  Date Value Ref Range Status  04/15/2023 216 (H) 98 - 192 U/L Final  Comment:    Performed at [facility], [address] [place], [place] [address]    Vital Signs:  Temp:  [97.6 F (36.4 C)-98.7 F (37.1 C)] 97.6 F (36.4 C) (01/16 0812) Pulse Rate:  [60-73] 66 (01/16 0812) Cardiac Rhythm: Normal sinus rhythm (01/15 1543) Resp:  [16-21] 16 (01/16 0812) BP: (114-136)/(67-97) 134/90 (01/16 0812) SpO2:  [95 %-100 %] 100 % (01/16 0812) No intake or output data in the 24 hours ending 05/18/24 0824  Current Heart Failure Medications:  Loop diuretic: furosemide  60 mg IV q12h  Beta-Blocker: carvedilol  12.5 mg BID ACEI/ARB/ARNI: Entresto  97/103 mg BID  MRA:  spironolactone  25 mg daily  SGLT2i: Farxiga  10 mg daily  Other:atorvastatin  40 mg daily, digoxin  0.0625 mg daily   Prior to admission Heart Failure Medications:  Loop diuretic: torsemide  60 mg daily  Beta-Blocker: carvedilol  12.5 mg BID  ACEI/ARB/ARNI: Entresto  97/103 mg BID  MRA: spironolactone  25 mg daily  SGLT2i: Farxiga  10 mg daily  Other:atorvastatin  40 mg daily, digoxin  0.0625 mg daily   Assessment: 1.   Acute on chronic systolic heart failure (LVEF 25-30%)  , due to previous cocaine use and uncontrolled HTN. NYHA class III symptoms.   -Symptoms: Reports feeling mildly improved from yesterday, reports poor appetite, mild swelling, and right foot pain  -Volume: Appears hypervolemic, large clear urine output, LLE. Continue furosemide  dose 60 mg IV q12h. Can consider adding metolazone  5 mg daily to increase total diuresis.  -Hemodynamics: BP 112-149/71-100, HR 60s-90s -BB: Continue carvedilol  12.5 mg BID  -ACEI/ARB/ARNI: Continue Entresto  97/103 mg BID  -MRA: Continue spironolactone  25 mg daily  -SGLT2i: Continue Farxiga  10 mg daily   Plan: 1) Medication changes recommended at this time: -Consider metolazone  5 mg today  2) Patient assistance: -Copay for Entresto , Farxiga , Jardaince are $4 each. Patient can use charge account if unable to provide copay at time of discharge.  3) Education: - Patient has been educated on current HF medications and potential additions to HF medication regimen - Patient verbalizes understanding that over the next few months, these medication doses may change and more medications may be added to optimize HF regimen - Patient has been educated on basic disease state pathophysiology and goals of therapy   Harlene Benders, [place] PharmD Candidate Please do not hesitate to reach out with questions or concerns,  Jaun Bash, PharmD, CPP, BCPS, [facility] Heart Failure Pharmacist  Phone - [phone] 05/18/2024 11:34 AM   Please do not hesitate to reach out  with questions or concerns,  Jaun Bash, PharmD, CPP, BCPS, [facility] Heart Failure Pharmacist  Phone - [phone] 05/18/2024 8:24 AM

## 2024-05-18 NOTE — Progress Notes (Signed)
 "  Cardiology Progress Note   Patient Name: Molly Howard Date of Encounter: 05/18/2024  Primary Cardiologist: Redell Cave, MD  Subjective   Breathing and edema improving but still orthopneic.  No chest pain. Objective   Inpatient Medications    Scheduled Meds:  aspirin  EC  81 mg Oral Daily   atorvastatin   40 mg Oral Daily   carvedilol   12.5 mg Oral BID WC   clopidogrel   75 mg Oral Daily   dapagliflozin  propanediol  10 mg Oral Daily   digoxin   0.0625 mg Oral Daily   enoxaparin  (LOVENOX ) injection  0.5 mg/kg Subcutaneous Q24H   ferrous sulfate   325 mg Oral BID   furosemide   60 mg Intravenous Q12H   sacubitril -valsartan   1 tablet Oral BID   spironolactone   25 mg Oral Daily   Continuous Infusions:  PRN Meds: acetaminophen  **OR** acetaminophen , guaiFENesin -dextromethorphan , magnesium  hydroxide, ondansetron  **OR** ondansetron  (ZOFRAN ) IV, oxyCODONE , traZODone    Vital Signs    Vitals:   05/18/24 0000 05/18/24 0214 05/18/24 0423 05/18/24 0812  BP: (!) 134/97 116/83 136/85 (!) 134/90  Pulse: 66 63 68 66  Resp: 20  20 16   Temp: 98.7 F (37.1 C)  97.6 F (36.4 C) 97.6 F (36.4 C)  TempSrc: Oral  Oral Oral  SpO2: 100% 100% 100% 100%  Weight:      Height:       No intake or output data in the 24 hours ending 05/18/24 0931 Filed Weights   05/14/24 1858 05/17/24 0628  Weight: 117.9 kg 117 kg    Physical Exam   GEN: Well nourished, well developed, in no acute distress.  HEENT: Grossly normal.  Neck: Supple, mod elevated JVP.  No bruits. Cardiac: RRR, no murmurs, rubs, or gallops. No clubbing, cyanosis, 1+ bilat LE woody edema.  Radials 2+, DP/PT 2+ and equal bilaterally.  Respiratory:  Respirations regular and unlabored, clear to auscultation bilaterally. GI: semi-firm, nontender, BS + x 4. MS: no deformity or atrophy. Skin: warm and dry, no rash. Neuro:  Strength and sensation are intact. Psych: AAOx3.  Flat affect.  Labs    Chemistry Recent Labs  Lab  05/14/24 1917 05/16/24 0802 05/17/24 0511  NA 138 141 136  K 3.6 3.5 4.4  CL 100 102 98  CO2 27 31 30   GLUCOSE 245* 131* 187*  BUN 21* 20 20  CREATININE 1.28* 1.33* 1.41*  CALCIUM  6.8* 6.8* 6.6*  PROT  --  5.3*  --   ALBUMIN   --  2.2*  --   AST  --  13*  --   ALT  --  6  --   ALKPHOS  --  112  --   BILITOT  --  0.7  --   GFRNONAA 54* 52* 48*  ANIONGAP 11 9 8      Hematology Recent Labs  Lab 05/14/24 1917 05/16/24 0802 05/17/24 0511  WBC 6.8 4.9 5.2  RBC 4.01 3.90 3.94  HGB 11.6* 11.3* 11.5*  HCT 37.5 35.9* 37.5  MCV 93.5 92.1 95.2  MCH 28.9 29.0 29.2  MCHC 30.9 31.5 30.7  RDW 15.1 15.2 14.9  PLT 303 271 289    Cardiac Enzymes  Recent Labs  Lab 05/14/24 1917 05/14/24 2341  TRNPT 67* 64*  ]  BNP    Component Value Date/Time   BNP 2,362.6 (H) 01/04/2024 1545    ProBNP    Component Value Date/Time   PROBNP 8,096.0 (H) 05/14/2024 1917     Lipids  Lab Results  Component Value Date   CHOL 210 (H) 02/21/2024   HDL 59 02/21/2024   LDLCALC 130 (H) 02/21/2024   TRIG 107 02/21/2024   CHOLHDL 3.6 02/21/2024    HbA1c  Lab Results  Component Value Date   HGBA1C 8.4 (H) 01/16/2024    Radiology    DG Foot Complete Right Result Date: 05/14/2024 EXAM: 3 OR MORE VIEW(S) XRAY OF THE RIGHT FOOT 05/14/2024 07:45:00 PM COMPARISON: None available. CLINICAL HISTORY: SOB FINDINGS: BONES AND JOINTS: No acute fracture. No malalignment. SOFT TISSUES: Vascular calcifications. IMPRESSION: 1. No acute findings. Electronically signed by: Oneil Devonshire MD MD 05/14/2024 08:35 PM EST RP Workstation: [id number]   DG Chest 2 View Result Date: 05/14/2024 EXAM: 2 VIEW(S) XRAY OF THE CHEST 05/14/2024 07:45:00 PM COMPARISON: 04/12/2024 CLINICAL HISTORY: SOB FINDINGS: LUNGS AND PLEURA: Left midlung zone linear scarring. Pulmonary edema and interstitial markings. No pleural effusion. No pneumothorax. HEART AND MEDIASTINUM: Cardiomegaly. Cardiac defibrillator in place. BONES AND SOFT  TISSUES: No acute osseous abnormality. IMPRESSION: 1. Pulmonary edema and increased interstitial markings. 2. Cardiomegaly with cardiac defibrillator in place. Electronically signed by: Oneil Devonshire MD MD 05/14/2024 08:34 PM EST RP Workstation: [id number]     Telemetry    RSR, PACS/PVCs - Personally Reviewed  Cardiac Studies   2D Echocardiogram 4.2025    1. Left ventricular ejection fraction, by estimation, is 30 to 35%. The  left ventricle has moderate to severely decreased function. The left  ventricle demonstrates global hypokinesis. There is mild left ventricular  hypertrophy. Left ventricular  diastolic parameters are indeterminate.   2. Right ventricular systolic function is low normal. The right  ventricular size is normal.   3. The mitral valve is normal in structure. Mild mitral valve  regurgitation.   4. Tricuspid valve regurgitation is mild to moderate.   5. The aortic valve is tricuspid. Aortic valve regurgitation is not  visualized.   6. The inferior vena cava is normal in size with <50% respiratory  variability, suggesting right atrial pressure of 8 mmHg.   Patient Profile     39 y.o. female with a history of chronic HFrEF, nonischemic cardiomyopathy, hypertension, stage II chronic kidney disease, diabetes, GERD, and polysubstance abuse, who was admitted January 12 with recurrent heart failure in the setting of noncompliance.   Assessment & Plan    1.  Acute on chronc HFrEF/NICM:  Patient admitted with progressive volume overload, dyspnea, proBNP of 8000, chest x-ray with pulmonary edema in the setting of noncompliance. Difficulty affording medications and follow-up office copayments in the outpatient setting -says medications cost between $12 and $15 a month however, she does not currently have any income.  She has continued responded well to intravenous Lasix  though still has some orthopnea, JVD and mild woody lower extremity edema.  I's and O's have not been measured.   No other has weight.  Renal function is pending this morning.  Continue current dose of IV Lasix  and consider escalation if renal function remains stable.  Continue beta-blocker, Entresto , spironolactone , digoxin , and Farxiga .  Will have to work with pharmacy and potentially social work to better understand how we can change her medication disposition in the outpatient setting.  2.  Demand ischemia: Mild troponin elevation to 67 in the setting of above.  No chest pain.  Nonischemic Myoview  2021.  No plans for ischemic valuation at this time.  Continue aspirin , statin, beta-blocker, and antiplatelet therapy.  3.  Primary hypertension: Relatively stable.  Continue current regimen.  4.  Hyperlipidemia: Continue  statin therapy.  LDL was 130 in October in the setting of noncompliance.  5.  Type 2 diabetes mellitus: A1c 8.4 September.  Per medicine.  6.  History of substance abuse: Urine drug screen negative this admission.  7.  History of stroke: On dual antiplatelet therapy and statin.  Signed, Lonni Meager, NP  05/18/2024, 9:31 AM    For questions or updates, please contact   Please consult [email or web address] for contact info under Cardiology/STEMI.  "

## 2024-05-19 DIAGNOSIS — Z91148 Patient's other noncompliance with medication regimen for other reason: Secondary | ICD-10-CM

## 2024-05-19 DIAGNOSIS — I428 Other cardiomyopathies: Secondary | ICD-10-CM

## 2024-05-19 DIAGNOSIS — I5023 Acute on chronic systolic (congestive) heart failure: Secondary | ICD-10-CM | POA: Diagnosis not present

## 2024-05-19 DIAGNOSIS — I2489 Other forms of acute ischemic heart disease: Secondary | ICD-10-CM | POA: Diagnosis not present

## 2024-05-19 DIAGNOSIS — I509 Heart failure, unspecified: Secondary | ICD-10-CM

## 2024-05-19 DIAGNOSIS — I5020 Unspecified systolic (congestive) heart failure: Secondary | ICD-10-CM | POA: Diagnosis not present

## 2024-05-19 DIAGNOSIS — Z8673 Personal history of transient ischemic attack (TIA), and cerebral infarction without residual deficits: Secondary | ICD-10-CM

## 2024-05-19 LAB — BASIC METABOLIC PANEL WITH GFR
Anion gap: 7 (ref 5–15)
BUN: 23 mg/dL — ABNORMAL HIGH (ref 6–20)
CO2: 31 mmol/L (ref 22–32)
Calcium: 7.5 mg/dL — ABNORMAL LOW (ref 8.9–10.3)
Chloride: 98 mmol/L (ref 98–111)
Creatinine, Ser: 1.41 mg/dL — ABNORMAL HIGH (ref 0.44–1.00)
GFR, Estimated: 48 mL/min — ABNORMAL LOW
Glucose, Bld: 148 mg/dL — ABNORMAL HIGH (ref 70–99)
Potassium: 4.7 mmol/L (ref 3.5–5.1)
Sodium: 137 mmol/L (ref 135–145)

## 2024-05-19 MED ORDER — METOLAZONE 5 MG PO TABS
5.0000 mg | ORAL_TABLET | Freq: Once | ORAL | Status: AC
  Administered 2024-05-19: 5 mg via ORAL
  Filled 2024-05-19: qty 1

## 2024-05-19 NOTE — Progress Notes (Signed)
 " PROGRESS NOTE    Molly Howard  FMW:[id number] DOB: 1986-04-21 DOA: 05/14/2024 PCP: [name], DO    Brief Narrative:  39 y.o. African-American female with medical history significant for GERD, chronic HFrEF, type diabetes mellitus, CKD stage 3a, nonischemic cardiomyopathy, polysubstance abuse and CVA, who presented to the emergency room with acute onset of worsening dyspnea over the last couple of days with associated orthopnea, paroxysmal nocturnal dyspnea and lower extremity edema.  She admitted to dry cough and wheezing.  She has been having dyspnea on exertion.  No fever or chills.  No nausea or vomiting or abdominal pain.  No dysuria, oliguria or hematuria or flank pain. She has reportedly been unable to    ED Course: BP was 174/116 with otherwise normal vital signs.  Labs revealed creatinine of 1.28 close to previous levels and calcium  6.8 with a BUN at 21.  proBNP was 8096.  High sensitive troponin T was 67 and later 64.  CBC showed hemoglobin 11.6 hematocrit 37.5. ZXH:dyntzi sinus rhythm at a rate of 74 with premature supraventricular complexes and possible left atrial enlargement and Q waves anteroseptally with T wave inversion inferolaterally. Imaging: 2 view chest x-ray showed pulm edema and increased interstitial markings as well as cardiomegaly with AICD in place.  Right foot x-ray showed no acute findings.   The patient was given 120 mg of IV Lasix  and admitted for CHF exacerbation.   Assessment and Plan:  Acute on Chronic HFrEF - Presented with dyspnea with elevated ProBNP and CXR findings of pulmonary edema   - TTE (02/2024) showed LVEF 25-30% with G3DD, mod reduced RV function, mod to severe TR - Has been out of home meds, unable to afford refills - Cardiology following - Continue Coreg  12.5 mg BID, Farxiga  10 mg, aldactone  25 mg, Entresto  97-103 mg BID, digoxin  0.0625 mg daily - Continue IV Lasix  60 mg BID - Metolazone  5 mg once  Hypertensive urgency -  resolved - BP elevated to 174/116 on admission - Possibly contributing to acute CHF exacerbation - Resolved with resumption of home meds  Elevated troponins - Likely secondary to demand ischemia  - No chest pain currently - Cardiology indicating no plans for ischemic workup   CKD stage 3a - Stable at baseline  CAD - Continue home aspirin  and plavix   Dyslipidemia - Continue home statin    Scheduled Meds:  aspirin  EC  81 mg Oral Daily   atorvastatin   40 mg Oral Daily   carvedilol   12.5 mg Oral BID WC   clopidogrel   75 mg Oral Daily   dapagliflozin  propanediol  10 mg Oral Daily   digoxin   0.0625 mg Oral Daily   enoxaparin  (LOVENOX ) injection  0.5 mg/kg Subcutaneous Q24H   ferrous sulfate   325 mg Oral BID   furosemide   60 mg Intravenous Q12H   sacubitril -valsartan   1 tablet Oral BID   spironolactone   25 mg Oral Daily   Continuous Infusions: PRN Meds:.acetaminophen  **OR** acetaminophen , guaiFENesin -dextromethorphan , magnesium  hydroxide, ondansetron  **OR** ondansetron  (ZOFRAN ) IV, oxyCODONE , traZODone   Current Outpatient Medications  Medication Instructions   Accu-Chek Softclix Lancets lancets Use as instructed   Aspirin  Low Dose 81 mg, Oral, Daily, Swallow whole.   atorvastatin  (LIPITOR) 40 mg, Oral, Daily   carvedilol  (COREG ) 12.5 mg, Oral, 2 times daily with meals   clopidogrel  (PLAVIX ) 75 mg, Oral, Daily   Continuous Glucose Sensor (FREESTYLE LIBRE 3 SENSOR) MISC Place 1 sensor on the skin every 14 days. Use to check glucose continuously  dapagliflozin  propanediol (FARXIGA ) 10 MG TABS tablet Take 1 tablet (10 mg total) by mouth once daily.   digoxin  (LANOXIN ) 0.0625 mg, Oral, Daily   FeroSul 325 mg, Oral, 2 times daily   Furoscix  80 mg, Subcutaneous, As directed   Ozempic  (0.25 or 0.5 MG/DOSE) 0.25 mg, Subcutaneous, Weekly   sacubitril -valsartan  (ENTRESTO ) 97-103 MG 1 tablet, Oral, 2 times daily   spironolactone  (ALDACTONE ) 25 mg, Oral, Daily   torsemide  (DEMADEX ) 60  mg, Oral, Daily    DVT prophylaxis:    Code Status:   Code Status: Full Code  Family Communication: None  Disposition Plan: Likely home pending clinical improvement  Level of care: Telemetry  Consultants:  Cardiology  Subjective:  Examined at bedside. Does not provide much insight, states feeling about the same as when she came in.    Objective: Vitals:   05/19/24 1221 05/19/24 1221 05/19/24 1615 05/19/24 2008  BP: 139/89 139/89 (!) 135/92 (!) 146/79  Pulse: 66 67 64 69  Resp: 17 17 16 17   Temp: 98.2 F (36.8 C) 98.2 F (36.8 C) 97.9 F (36.6 C) (!) 97.4 F (36.3 C)  TempSrc:   Oral   SpO2: 98% 97% 97% 98%  Weight:      Height:        Intake/Output Summary (Last 24 hours) at 05/19/2024 2239 Last data filed at 05/19/2024 1730 Gross per 24 hour  Intake 720 ml  Output 1400 ml  Net -680 ml   Filed Weights   05/17/24 0628 05/18/24 1944 05/19/24 0500  Weight: 117 kg 117.2 kg 118.8 kg    Examination:  Gen: NAD, A&Ox3 Neck: Supple CV: RRR, no murmurs Resp: normal WOB, CTAB, no wheezing or rhonchi Abd: Soft, NTND, no guarding, BS normoactive Ext: 1+  LE edema Skin: Warm, dry, no rashes/lesions Neuro: CN II-XII grossly intact, strength 5/5 b/l, sensation intact, normal gait Psych: Calm, cooperative, flat affect    Data Reviewed: I have personally reviewed following labs and imaging studies  CBC: Recent Labs  Lab 05/14/24 1917 05/16/24 0802 05/17/24 0511 05/18/24 0601  WBC 6.8 4.9 5.2 4.7  HGB 11.6* 11.3* 11.5* 11.3*  HCT 37.5 35.9* 37.5 36.8  MCV 93.5 92.1 95.2 94.4  PLT 303 271 289 305   Basic Metabolic Panel: Recent Labs  Lab 05/14/24 1917 05/16/24 0802 05/17/24 0511 05/18/24 0601 05/19/24 0812  NA 138 141 136 138 137  K 3.6 3.5 4.4 4.5 4.7  CL 100 102 98 98 98  CO2 27 31 30 31 31   GLUCOSE 245* 131* 187* 203* 148*  BUN 21* 20 20 22* 23*  CREATININE 1.28* 1.33* 1.41* 1.41* 1.41*  CALCIUM  6.8* 6.8* 6.6* 7.2* 7.5*  MG  --  1.6* 1.9   --   --    GFR: Estimated Creatinine Clearance: 71.5 mL/min (A) (by C-G formula based on SCr of 1.41 mg/dL (H)). Liver Function Tests: Recent Labs  Lab 05/16/24 0802  AST 13*  ALT 6  ALKPHOS 112  BILITOT 0.7  PROT 5.3*  ALBUMIN  2.2*   No results for input(s): LIPASE, AMYLASE in the last 168 hours. No results for input(s): AMMONIA in the last 168 hours. Coagulation Profile: No results for input(s): INR, PROTIME in the last 168 hours. Cardiac Enzymes: No results for input(s): CKTOTAL, CKMB, CKMBINDEX, TROPONINI in the last 168 hours. BNP (last 3 results) Recent Labs    04/20/24 1317 05/14/24 1917  PROBNP 6,696.0* 8,096.0*   HbA1C: No results for input(s): HGBA1C in the last 72  hours. CBG: Recent Labs  Lab 05/17/24 0745  GLUCAP 157*   Lipid Profile: No results for input(s): CHOL, HDL, LDLCALC, TRIG, CHOLHDL, LDLDIRECT in the last 72 hours. Thyroid  Function Tests: No results for input(s): TSH, T4TOTAL, FREET4, T3FREE, THYROIDAB in the last 72 hours. Anemia Panel: No results for input(s): VITAMINB12, FOLATE, FERRITIN, TIBC, IRON , RETICCTPCT in the last 72 hours. Sepsis Labs: No results for input(s): PROCALCITON, LATICACIDVEN in the last 168 hours.  No results found for this or any previous visit (from the past 240 hours).   Radiology Studies: No results found.   Scheduled Meds:  aspirin  EC  81 mg Oral Daily   atorvastatin   40 mg Oral Daily   carvedilol   12.5 mg Oral BID WC   clopidogrel   75 mg Oral Daily   dapagliflozin  propanediol  10 mg Oral Daily   digoxin   0.0625 mg Oral Daily   enoxaparin  (LOVENOX ) injection  0.5 mg/kg Subcutaneous Q24H   ferrous sulfate   325 mg Oral BID   furosemide   60 mg Intravenous Q12H   sacubitril -valsartan   1 tablet Oral BID   spironolactone   25 mg Oral Daily   Continuous Infusions:   Unresulted Labs (From admission, onward)     Start     Ordered   05/19/24 0753   Basic metabolic panel  Daily,   R      05/19/24 0752             LOS:  LOS: 4 days   Time Spent: 45 minutes  Lashanna Angelo Al-Sultani, MD [facility]  If 7PM-7AM, please contact night-coverage  05/19/2024, 10:39 PM      "

## 2024-05-19 NOTE — Plan of Care (Signed)
 VSS. RA. Patient continues IV diuresis. Cards added meds. See MAR. PIV dislodged. IV team consult placed and IV placed.   Problem: Education: Goal: Knowledge of General Education information will improve Description: Including pain rating scale, medication(s)/side effects and non-pharmacologic comfort measures Outcome: Progressing   Problem: Health Behavior/Discharge Planning: Goal: Ability to manage health-related needs will improve Outcome: Progressing   Problem: Clinical Measurements: Goal: Ability to maintain clinical measurements within normal limits will improve Outcome: Progressing Goal: Will remain free from infection Outcome: Progressing Goal: Diagnostic test results will improve Outcome: Progressing Goal: Respiratory complications will improve Outcome: Progressing Goal: Cardiovascular complication will be avoided Outcome: Progressing   Problem: Activity: Goal: Risk for activity intolerance will decrease Outcome: Progressing   Problem: Nutrition: Goal: Adequate nutrition will be maintained Outcome: Progressing   Problem: Coping: Goal: Level of anxiety will decrease Outcome: Progressing   Problem: Elimination: Goal: Will not experience complications related to bowel motility Outcome: Progressing Goal: Will not experience complications related to urinary retention Outcome: Progressing   Problem: Pain Managment: Goal: General experience of comfort will improve and/or be controlled Outcome: Progressing   Problem: Safety: Goal: Ability to remain free from injury will improve Outcome: Progressing   Problem: Skin Integrity: Goal: Risk for impaired skin integrity will decrease Outcome: Progressing   Problem: Education: Goal: Ability to demonstrate management of disease process will improve Outcome: Progressing Goal: Ability to verbalize understanding of medication therapies will improve Outcome: Progressing Goal: Individualized Educational Video(s) Outcome:  Progressing   Problem: Activity: Goal: Capacity to carry out activities will improve Outcome: Progressing   Problem: Cardiac: Goal: Ability to achieve and maintain adequate cardiopulmonary perfusion will improve Outcome: Progressing

## 2024-05-19 NOTE — Progress Notes (Signed)
 "  Cardiology Progress Note   Patient Name: Molly Howard Date of Encounter: 05/19/2024  Primary Cardiologist: Redell Cave, MD  Subjective   Still orthopneic and not quite back to baseline.  Notes good urine output. Objective   Inpatient Medications    Scheduled Meds:  aspirin  EC  81 mg Oral Daily   atorvastatin   40 mg Oral Daily   carvedilol   12.5 mg Oral BID WC   clopidogrel   75 mg Oral Daily   dapagliflozin  propanediol  10 mg Oral Daily   digoxin   0.0625 mg Oral Daily   enoxaparin  (LOVENOX ) injection  0.5 mg/kg Subcutaneous Q24H   ferrous sulfate   325 mg Oral BID   furosemide   60 mg Intravenous Q12H   sacubitril -valsartan   1 tablet Oral BID   spironolactone   25 mg Oral Daily   Continuous Infusions:  PRN Meds: acetaminophen  **OR** acetaminophen , guaiFENesin -dextromethorphan , magnesium  hydroxide, ondansetron  **OR** ondansetron  (ZOFRAN ) IV, oxyCODONE , traZODone    Vital Signs    Vitals:   05/19/24 0012 05/19/24 0253 05/19/24 0338 05/19/24 0500  BP: (!) 147/93 126/78 117/76   Pulse: 66 68 68   Resp: 18  18   Temp: 97.8 F (36.6 C)  98.6 F (37 C)   TempSrc:      SpO2: 99%  99%   Weight:    118.8 kg  Height:        Intake/Output Summary (Last 24 hours) at 05/19/2024 0751 Last data filed at 05/19/2024 0649 Gross per 24 hour  Intake 150 ml  Output 1600 ml  Net -1450 ml   Filed Weights   05/17/24 0628 05/18/24 1944 05/19/24 0500  Weight: 117 kg 117.2 kg 118.8 kg    Physical Exam   GEN: Well nourished, well developed, in no acute distress.  HEENT: Grossly normal.  Neck: Supple, JVD to jaw.  No bruits.  Cardiac: RRR, no murmurs, rubs, or gallops. No clubbing, cyanosis, 1+ bilateral lower extremity woody edema.  Radials 2+, DP/PT 2+ and equal bilaterally.  Respiratory:  Respirations regular and unlabored, clear to auscultation bilaterally. GI: Semifirm, nontender, BS + x 4. MS: no deformity or atrophy. Skin: warm and dry, no rash. Neuro:  Strength and  sensation are intact. Psych: AAOx3.  Normal affect.  Labs    Chemistry Recent Labs  Lab 05/16/24 0802 05/17/24 0511 05/18/24 0601  NA 141 136 138  K 3.5 4.4 4.5  CL 102 98 98  CO2 31 30 31   GLUCOSE 131* 187* 203*  BUN 20 20 22*  CREATININE 1.33* 1.41* 1.41*  CALCIUM  6.8* 6.6* 7.2*  PROT 5.3*  --   --   ALBUMIN  2.2*  --   --   AST 13*  --   --   ALT 6  --   --   ALKPHOS 112  --   --   BILITOT 0.7  --   --   GFRNONAA 52* 48* 48*  ANIONGAP 9 8 9      Hematology Recent Labs  Lab 05/16/24 0802 05/17/24 0511 05/18/24 0601  WBC 4.9 5.2 4.7  RBC 3.90 3.94 3.90  HGB 11.3* 11.5* 11.3*  HCT 35.9* 37.5 36.8  MCV 92.1 95.2 94.4  MCH 29.0 29.2 29.0  MCHC 31.5 30.7 30.7  RDW 15.2 14.9 15.1  PLT 271 289 305    Cardiac Enzymes  Recent Labs  Lab 05/14/24 1917 05/14/24 2341  TRNPT 67* 64*    BNP    Component Value Date/Time   BNP 2,362.6 (H) 01/04/2024 1545  ProBNP    Component Value Date/Time   PROBNP 8,096.0 (H) 05/14/2024 1917    Lipids  Lab Results  Component Value Date   CHOL 210 (H) 02/21/2024   HDL 59 02/21/2024   LDLCALC 130 (H) 02/21/2024   TRIG 107 02/21/2024   CHOLHDL 3.6 02/21/2024    HbA1c  Lab Results  Component Value Date   HGBA1C 8.4 (H) 01/16/2024    Radiology    No results found.   Telemetry    Sinus rhythm with PVCs and a 5 beat run of nonsustained VT- Personally Reviewed  Cardiac Studies    2D Echocardiogram 4.2025    1. Left ventricular ejection fraction, by estimation, is 30 to 35%. The  left ventricle has moderate to severely decreased function. The left  ventricle demonstrates global hypokinesis. There is mild left ventricular  hypertrophy. Left ventricular  diastolic parameters are indeterminate.   2. Right ventricular systolic function is low normal. The right  ventricular size is normal.   3. The mitral valve is normal in structure. Mild mitral valve  regurgitation.   4. Tricuspid valve regurgitation is  mild to moderate.   5. The aortic valve is tricuspid. Aortic valve regurgitation is not  visualized.   6. The inferior vena cava is normal in size with <50% respiratory  variability, suggesting right atrial pressure of 8 mmHg.   Patient Profile     39 y.o. female with a history of chronic HFrEF, nonischemic cardiomyopathy, hypertension, stage II chronic kidney disease, diabetes, GERD, and polysubstance abuse, who was admitted January 12 with recurrent heart failure in the setting of noncompliance.   Assessment & Plan    1.  Acute on chronc HFrEF/NICM:  Patient admitted with progressive volume overload, dyspnea, proBNP of 8000, chest x-ray with pulmonary edema in the setting of noncompliance. Difficulty affording medications and follow-up office copayments in the outpatient setting -says medications cost between $12 and $15 a month however, she does not currently have any income.  She notes good response to Lasix .  Continues to have orthopnea, JVD, and lower extremity edema.  I's and O's inaccurate though she is listed as minus 1.4 L yesterday.  Creatinine remains stable at 1.41 with a BUN of 23.  Continue current dose of IV Lasix .  Continue beta-blocker, Entresto , spironolactone , digoxin , and Farxiga .  Will have to work with pharmacy and potentially social work to better understand how we can change her medication disposition/compliance in the outpatient setting.   2.  Demand ischemia: Mild troponin elevation to 67 in the setting of above.  No chest pain.  Nonischemic Myoview  2021.  No plans for ischemic valuation at this time.  Continue aspirin , statin, beta-blocker, and antiplatelet therapy.   3.  Primary hypertension: Stable this morning.  Continue current regimen.   4.  Hyperlipidemia: Continue statin therapy.  LDL was 130 in October in the setting of noncompliance.   5.  Type 2 diabetes mellitus: A1c 8.4 September.  Per medicine.   6.  History of substance abuse: Urine drug screen negative  this admission.   7.  History of stroke: On dual antiplatelet therapy and statin.    Signed, Lonni Meager, NP  05/19/2024, 7:51 AM    For questions or updates, please contact   Please consult [email or web address] for contact info under Cardiology/STEMI.  "

## 2024-05-20 DIAGNOSIS — I5023 Acute on chronic systolic (congestive) heart failure: Secondary | ICD-10-CM | POA: Diagnosis not present

## 2024-05-20 LAB — BASIC METABOLIC PANEL WITH GFR
Anion gap: 9 (ref 5–15)
BUN: 29 mg/dL — ABNORMAL HIGH (ref 6–20)
CO2: 32 mmol/L (ref 22–32)
Calcium: 7.8 mg/dL — ABNORMAL LOW (ref 8.9–10.3)
Chloride: 95 mmol/L — ABNORMAL LOW (ref 98–111)
Creatinine, Ser: 1.53 mg/dL — ABNORMAL HIGH (ref 0.44–1.00)
GFR, Estimated: 44 mL/min — ABNORMAL LOW
Glucose, Bld: 202 mg/dL — ABNORMAL HIGH (ref 70–99)
Potassium: 4.9 mmol/L (ref 3.5–5.1)
Sodium: 136 mmol/L (ref 135–145)

## 2024-05-20 LAB — HEMOGLOBIN A1C
Hgb A1c MFr Bld: 9.2 % — ABNORMAL HIGH (ref 4.8–5.6)
Mean Plasma Glucose: 217.34 mg/dL

## 2024-05-20 LAB — GLUCOSE, CAPILLARY
Glucose-Capillary: 175 mg/dL — ABNORMAL HIGH (ref 70–99)
Glucose-Capillary: 243 mg/dL — ABNORMAL HIGH (ref 70–99)
Glucose-Capillary: 228 mg/dL — ABNORMAL HIGH (ref 70–99)

## 2024-05-20 MED ORDER — FUROSEMIDE 10 MG/ML IJ SOLN: 40.0000 mg | Freq: Two times a day (BID) | INTRAMUSCULAR | Status: DC

## 2024-05-20 MED ORDER — INSULIN ASPART 100 UNIT/ML IJ SOLN
0.0000 [IU] | Freq: Three times a day (TID) | INTRAMUSCULAR | Status: DC
  Administered 2024-05-20 – 2024-05-21 (×2): 3 [IU] via SUBCUTANEOUS
  Administered 2024-05-21 (×2): 2 [IU] via SUBCUTANEOUS
  Administered 2024-05-22: 7 [IU] via SUBCUTANEOUS
  Administered 2024-05-22: 2 [IU] via SUBCUTANEOUS
  Filled 2024-05-20 (×2): qty 2
  Filled 2024-05-20: qty 3
  Filled 2024-05-20: qty 7
  Filled 2024-05-20: qty 2
  Filled 2024-05-20: qty 3

## 2024-05-20 NOTE — Progress Notes (Signed)
 " PROGRESS NOTE    Molly Howard  FMW:[id number] DOB: 23-Feb-1986 DOA: 05/14/2024 PCP: [name], DO    Brief Narrative:  39 y.o. African-American female with medical history significant for GERD, chronic HFrEF, type diabetes mellitus, CKD stage 3a, nonischemic cardiomyopathy, polysubstance abuse and CVA, who presented to the emergency room with acute onset of worsening dyspnea over the last couple of days with associated orthopnea, paroxysmal nocturnal dyspnea and lower extremity edema.  She admitted to dry cough and wheezing.  She has been having dyspnea on exertion.  No fever or chills.  No nausea or vomiting or abdominal pain.  No dysuria, oliguria or hematuria or flank pain. She has reportedly been unable to    ED Course: BP was 174/116 with otherwise normal vital signs.  Labs revealed creatinine of 1.28 close to previous levels and calcium  6.8 with a BUN at 21.  proBNP was 8096.  High sensitive troponin T was 67 and later 64.  CBC showed hemoglobin 11.6 hematocrit 37.5. ZXH:dyntzi sinus rhythm at a rate of 74 with premature supraventricular complexes and possible left atrial enlargement and Q waves anteroseptally with T wave inversion inferolaterally. Imaging: 2 view chest x-ray showed pulm edema and increased interstitial markings as well as cardiomegaly with AICD in place.  Right foot x-ray showed no acute findings.   The patient was given 120 mg of IV Lasix  and admitted for CHF exacerbation.   Assessment and Plan:  Acute on Chronic HFrEF - Presented with dyspnea with elevated ProBNP and CXR findings of pulmonary edema   - TTE (02/2024) showed LVEF 25-30% with G3DD, mod reduced RV function, mod to severe TR - Has been out of home meds, unable to afford refills - Cardiology following - Continue Coreg  12.5 mg BID, Farxiga  10 mg, aldactone  25 mg, Entresto  97-103 mg BID, digoxin  0.0625 mg daily - Received IV Lasix  60 mg BID and Metolazone  5 mg once yesterday with slight increase in  Cr today - Cardiology recommending holding diuresis today and resuming tomorrow at reduced dose of IV Lasix  40 mg BID  Hypertensive urgency - resolved Primary Hypertension - BP elevated to 174/116 on admission - Possibly contributing to acute CHF exacerbation - Resolved with resumption of home meds - Continue Coreg  12.5 mg BID, Entresto  97-103 mg BID, aldactone  25 mg   Elevated troponins - Likely secondary to demand ischemia  - No chest pain currently - Cardiology indicating no plans for ischemic workup   CKD stage 3a - Overall stable at baseline, slight increase in Cr after diuresis  CAD - Continue home aspirin  and plavix   T2DM - Hgb A1c ordered (previously 8.4 - 01/2024) .lasta1 - SSI 0-9 AC - Accu checks  Dyslipidemia - Continue home statin    Scheduled Meds:  aspirin  EC  81 mg Oral Daily   atorvastatin   40 mg Oral Daily   carvedilol   12.5 mg Oral BID WC   clopidogrel   75 mg Oral Daily   dapagliflozin  propanediol  10 mg Oral Daily   digoxin   0.0625 mg Oral Daily   enoxaparin  (LOVENOX ) injection  0.5 mg/kg Subcutaneous Q24H   ferrous sulfate   325 mg Oral BID   sacubitril -valsartan   1 tablet Oral BID   spironolactone   25 mg Oral Daily   Continuous Infusions: PRN Meds:.acetaminophen  **OR** acetaminophen , guaiFENesin -dextromethorphan , magnesium  hydroxide, ondansetron  **OR** ondansetron  (ZOFRAN ) IV, oxyCODONE , traZODone   Current Outpatient Medications  Medication Instructions   Accu-Chek Softclix Lancets lancets Use as instructed   Aspirin  Low Dose 81 mg,  Oral, Daily, Swallow whole.   atorvastatin  (LIPITOR) 40 mg, Oral, Daily   carvedilol  (COREG ) 12.5 mg, Oral, 2 times daily with meals   clopidogrel  (PLAVIX ) 75 mg, Oral, Daily   Continuous Glucose Sensor (FREESTYLE LIBRE 3 SENSOR) MISC Place 1 sensor on the skin every 14 days. Use to check glucose continuously   dapagliflozin  propanediol (FARXIGA ) 10 MG TABS tablet Take 1 tablet (10 mg total) by mouth once daily.    digoxin  (LANOXIN ) 0.0625 mg, Oral, Daily   FeroSul 325 mg, Oral, 2 times daily   Furoscix  80 mg, Subcutaneous, As directed   Ozempic  (0.25 or 0.5 MG/DOSE) 0.25 mg, Subcutaneous, Weekly   sacubitril -valsartan  (ENTRESTO ) 97-103 MG 1 tablet, Oral, 2 times daily   spironolactone  (ALDACTONE ) 25 mg, Oral, Daily   torsemide  (DEMADEX ) 60 mg, Oral, Daily    DVT prophylaxis:    Code Status:   Code Status: Full Code  Family Communication: None  Disposition Plan: Likely home pending clinical improvement  Level of care: Telemetry  Consultants:  Cardiology  Subjective:  Examined at bedside. States she feels the same with regards to her breathing. Doesn't provide much further information. However, she is ambulating comfortably around the room.   Objective: Vitals:   05/20/24 0052 05/20/24 0421 05/20/24 0456 05/20/24 0953  BP: 134/88 126/82  121/71  Pulse: 65 65  68  Resp: 17 18  17   Temp: 98 F (36.7 C) 97.8 F (36.6 C)  97.9 F (36.6 C)  TempSrc:      SpO2: 93% 93%  94%  Weight:   114.1 kg   Height:        Intake/Output Summary (Last 24 hours) at 05/20/2024 1325 Last data filed at 05/20/2024 0900 Gross per 24 hour  Intake 960 ml  Output 1250 ml  Net -290 ml   Filed Weights   05/18/24 1944 05/19/24 0500 05/20/24 0456  Weight: 117.2 kg 118.8 kg 114.1 kg    Examination:  Gen: NAD, A&Ox3 Neck: Supple CV: RRR, no murmurs Resp: normal WOB, CTAB, no wheezing or rhonchi Abd: Soft, NTND, no guarding, BS normoactive Ext: 1+  LE edema Skin: Warm, dry, no rashes/lesions Neuro: CN II-XII grossly intact, strength 5/5 b/l, sensation intact, normal gait Psych: Calm, cooperative, flat affect    Data Reviewed: I have personally reviewed following labs and imaging studies  CBC: Recent Labs  Lab 05/14/24 1917 05/16/24 0802 05/17/24 0511 05/18/24 0601  WBC 6.8 4.9 5.2 4.7  HGB 11.6* 11.3* 11.5* 11.3*  HCT 37.5 35.9* 37.5 36.8  MCV 93.5 92.1 95.2 94.4  PLT 303 271 289 305    Basic Metabolic Panel: Recent Labs  Lab 05/16/24 0802 05/17/24 0511 05/18/24 0601 05/19/24 0812 05/20/24 0350  NA 141 136 138 137 136  K 3.5 4.4 4.5 4.7 4.9  CL 102 98 98 98 95*  CO2 31 30 31 31  32  GLUCOSE 131* 187* 203* 148* 202*  BUN 20 20 22* 23* 29*  CREATININE 1.33* 1.41* 1.41* 1.41* 1.53*  CALCIUM  6.8* 6.6* 7.2* 7.5* 7.8*  MG 1.6* 1.9  --   --   --    GFR: Estimated Creatinine Clearance: 64.4 mL/min (A) (by C-G formula based on SCr of 1.53 mg/dL (H)). Liver Function Tests: Recent Labs  Lab 05/16/24 0802  AST 13*  ALT 6  ALKPHOS 112  BILITOT 0.7  PROT 5.3*  ALBUMIN  2.2*   No results for input(s): LIPASE, AMYLASE in the last 168 hours. No results for input(s): AMMONIA in the  last 168 hours. Coagulation Profile: No results for input(s): INR, PROTIME in the last 168 hours. Cardiac Enzymes: No results for input(s): CKTOTAL, CKMB, CKMBINDEX, TROPONINI in the last 168 hours. BNP (last 3 results) Recent Labs    04/20/24 1317 05/14/24 1917  PROBNP 6,696.0* 8,096.0*   HbA1C: No results for input(s): HGBA1C in the last 72 hours. CBG: Recent Labs  Lab 05/17/24 0745  GLUCAP 157*   Lipid Profile: No results for input(s): CHOL, HDL, LDLCALC, TRIG, CHOLHDL, LDLDIRECT in the last 72 hours. Thyroid  Function Tests: No results for input(s): TSH, T4TOTAL, FREET4, T3FREE, THYROIDAB in the last 72 hours. Anemia Panel: No results for input(s): VITAMINB12, FOLATE, FERRITIN, TIBC, IRON , RETICCTPCT in the last 72 hours. Sepsis Labs: No results for input(s): PROCALCITON, LATICACIDVEN in the last 168 hours.  No results found for this or any previous visit (from the past 240 hours).   Radiology Studies: No results found.   Scheduled Meds:  aspirin  EC  81 mg Oral Daily   atorvastatin   40 mg Oral Daily   carvedilol   12.5 mg Oral BID WC   clopidogrel   75 mg Oral Daily   dapagliflozin  propanediol  10 mg Oral  Daily   digoxin   0.0625 mg Oral Daily   enoxaparin  (LOVENOX ) injection  0.5 mg/kg Subcutaneous Q24H   ferrous sulfate   325 mg Oral BID   sacubitril -valsartan   1 tablet Oral BID   spironolactone   25 mg Oral Daily   Continuous Infusions:   Unresulted Labs (From admission, onward)     Start     Ordered   05/20/24 1329  Hemoglobin A1c  Add-on,   AD       Comments: To assess prior glycemic control    05/20/24 1329   05/19/24 0753  Basic metabolic panel  Daily,   R      05/19/24 0752             LOS:  LOS: 5 days   Time Spent: 45 minutes  Samera Macy Al-Sultani, MD [facility]  If 7PM-7AM, please contact night-coverage  05/20/2024, 1:25 PM      "

## 2024-05-20 NOTE — Plan of Care (Signed)
 VSS. RA. Patient started on sliding scale insulin  to manage Bgs. Patient still using BSC.   Problem: Education: Goal: Knowledge of General Education information will improve Description: Including pain rating scale, medication(s)/side effects and non-pharmacologic comfort measures Outcome: Progressing   Problem: Health Behavior/Discharge Planning: Goal: Ability to manage health-related needs will improve Outcome: Progressing   Problem: Clinical Measurements: Goal: Ability to maintain clinical measurements within normal limits will improve Outcome: Progressing Goal: Will remain free from infection Outcome: Progressing Goal: Diagnostic test results will improve Outcome: Progressing Goal: Respiratory complications will improve Outcome: Progressing Goal: Cardiovascular complication will be avoided Outcome: Progressing   Problem: Activity: Goal: Risk for activity intolerance will decrease Outcome: Progressing   Problem: Nutrition: Goal: Adequate nutrition will be maintained Outcome: Progressing   Problem: Coping: Goal: Level of anxiety will decrease Outcome: Progressing   Problem: Elimination: Goal: Will not experience complications related to bowel motility Outcome: Progressing Goal: Will not experience complications related to urinary retention Outcome: Progressing   Problem: Pain Managment: Goal: General experience of comfort will improve and/or be controlled Outcome: Progressing   Problem: Safety: Goal: Ability to remain free from injury will improve Outcome: Progressing   Problem: Skin Integrity: Goal: Risk for impaired skin integrity will decrease Outcome: Progressing   Problem: Education: Goal: Ability to demonstrate management of disease process will improve Outcome: Progressing Goal: Ability to verbalize understanding of medication therapies will improve Outcome: Progressing Goal: Individualized Educational Video(s) Outcome: Progressing   Problem:  Activity: Goal: Capacity to carry out activities will improve Outcome: Progressing   Problem: Cardiac: Goal: Ability to achieve and maintain adequate cardiopulmonary perfusion will improve Outcome: Progressing   Problem: Education: Goal: Ability to describe self-care measures that may prevent or decrease complications (Diabetes Survival Skills Education) will improve Outcome: Progressing Goal: Individualized Educational Video(s) Outcome: Progressing   Problem: Coping: Goal: Ability to adjust to condition or change in health will improve Outcome: Progressing   Problem: Fluid Volume: Goal: Ability to maintain a balanced intake and output will improve Outcome: Progressing   Problem: Health Behavior/Discharge Planning: Goal: Ability to identify and utilize available resources and services will improve Outcome: Progressing Goal: Ability to manage health-related needs will improve Outcome: Progressing   Problem: Metabolic: Goal: Ability to maintain appropriate glucose levels will improve Outcome: Progressing   Problem: Nutritional: Goal: Maintenance of adequate nutrition will improve Outcome: Progressing Goal: Progress toward achieving an optimal weight will improve Outcome: Progressing   Problem: Skin Integrity: Goal: Risk for impaired skin integrity will decrease Outcome: Progressing   Problem: Tissue Perfusion: Goal: Adequacy of tissue perfusion will improve Outcome: Progressing

## 2024-05-20 NOTE — Progress Notes (Addendum)
 "  Cardiology Progress Note   Patient Name: Molly Howard Date of Encounter: 05/20/2024  Primary Cardiologist: Redell Cave, MD  Subjective   Feels that breathing is nearing baseline.  She says her abdomen is softer.  Continues to have lower extremity edema. Objective   Inpatient Medications    Scheduled Meds:  aspirin  EC  81 mg Oral Daily   atorvastatin   40 mg Oral Daily   carvedilol   12.5 mg Oral BID WC   clopidogrel   75 mg Oral Daily   dapagliflozin  propanediol  10 mg Oral Daily   digoxin   0.0625 mg Oral Daily   enoxaparin  (LOVENOX ) injection  0.5 mg/kg Subcutaneous Q24H   ferrous sulfate   325 mg Oral BID   furosemide   60 mg Intravenous Q12H   sacubitril -valsartan   1 tablet Oral BID   spironolactone   25 mg Oral Daily   Continuous Infusions:  PRN Meds: acetaminophen  **OR** acetaminophen , guaiFENesin -dextromethorphan , magnesium  hydroxide, ondansetron  **OR** ondansetron  (ZOFRAN ) IV, oxyCODONE , traZODone    Vital Signs    Vitals:   05/19/24 2008 05/20/24 0052 05/20/24 0421 05/20/24 0456  BP: (!) 146/79 134/88 126/82   Pulse: 69 65 65   Resp: 17 17 18    Temp: (!) 97.4 F (36.3 C) 98 F (36.7 C) 97.8 F (36.6 C)   TempSrc:      SpO2: 98% 93% 93%   Weight:    114.1 kg  Height:        Intake/Output Summary (Last 24 hours) at 05/20/2024 0850 Last data filed at 05/20/2024 0600 Gross per 24 hour  Intake 960 ml  Output 1250 ml  Net -290 ml   Filed Weights   05/18/24 1944 05/19/24 0500 05/20/24 0456  Weight: 117.2 kg 118.8 kg 114.1 kg    Physical Exam   GEN: Well nourished, well developed, in no acute distress.  HEENT: Grossly normal.  Neck: Supple, moderately elevated JVP.  No bruits or masses.   Cardiac: RRR, no murmurs, rubs, or gallops. No clubbing, cyanosis, 1+ bilateral lower extremity edema extending to the lower posterior thighs.  Radials 2+, DP/PT 2+ and equal bilaterally.  Respiratory:  Respirations regular and unlabored, clear to auscultation  bilaterally. GI: Obese, semifirm, nontender, BS + x 4. MS: no deformity or atrophy. Skin: warm and dry, no rash. Neuro:  Strength and sensation are intact. Psych: AAOx3.  Normal affect.  Labs    Chemistry Recent Labs  Lab 05/16/24 0802 05/17/24 0511 05/18/24 0601 05/19/24 0812 05/20/24 0350  NA 141   < > 138 137 136  K 3.5   < > 4.5 4.7 4.9  CL 102   < > 98 98 95*  CO2 31   < > 31 31 32  GLUCOSE 131*   < > 203* 148* 202*  BUN 20   < > 22* 23* 29*  CREATININE 1.33*   < > 1.41* 1.41* 1.53*  CALCIUM  6.8*   < > 7.2* 7.5* 7.8*  PROT 5.3*  --   --   --   --   ALBUMIN  2.2*  --   --   --   --   AST 13*  --   --   --   --   ALT 6  --   --   --   --   ALKPHOS 112  --   --   --   --   BILITOT 0.7  --   --   --   --   GFRNONAA 52*   < >  48* 48* 44*  ANIONGAP 9   < > 9 7 9    < > = values in this interval not displayed.     Hematology Recent Labs  Lab 05/16/24 0802 05/17/24 0511 05/18/24 0601  WBC 4.9 5.2 4.7  RBC 3.90 3.94 3.90  HGB 11.3* 11.5* 11.3*  HCT 35.9* 37.5 36.8  MCV 92.1 95.2 94.4  MCH 29.0 29.2 29.0  MCHC 31.5 30.7 30.7  RDW 15.2 14.9 15.1  PLT 271 289 305    Cardiac Enzymes  Recent Labs  Lab 05/14/24 1917 05/14/24 2341  TRNPT 67* 64*    BNP    Component Value Date/Time   BNP 2,362.6 (H) 01/04/2024 1545    ProBNP    Component Value Date/Time   PROBNP 8,096.0 (H) 05/14/2024 1917   Lipids  Lab Results  Component Value Date   CHOL 210 (H) 02/21/2024   HDL 59 02/21/2024   LDLCALC 130 (H) 02/21/2024   TRIG 107 02/21/2024   CHOLHDL 3.6 02/21/2024    HbA1c  Lab Results  Component Value Date   HGBA1C 8.4 (H) 01/16/2024    Radiology    No results found.   Telemetry    Sinus rhythm, 4 beats nonsustained VT- Personally Reviewed  Cardiac Studies   2D Echocardiogram 4.2025    1. Left ventricular ejection fraction, by estimation, is 30 to 35%. The  left ventricle has moderate to severely decreased function. The left  ventricle  demonstrates global hypokinesis. There is mild left ventricular  hypertrophy. Left ventricular  diastolic parameters are indeterminate.   2. Right ventricular systolic function is low normal. The right  ventricular size is normal.   3. The mitral valve is normal in structure. Mild mitral valve  regurgitation.   4. Tricuspid valve regurgitation is mild to moderate.   5. The aortic valve is tricuspid. Aortic valve regurgitation is not  visualized.   6. The inferior vena cava is normal in size with <50% respiratory  variability, suggesting right atrial pressure of 8 mmHg.   Patient Profile     39 y.o. female with a history of chronic HFrEF, nonischemic cardiomyopathy, hypertension, stage II chronic kidney disease, diabetes, GERD, and polysubstance abuse, who was admitted January 12 with recurrent heart failure in the setting of noncompliance.   Assessment & Plan    1.  Acute on chronc HFrEF/NICM:  Patient admitted with progressive volume overload, dyspnea, proBNP of 8000, chest x-ray with pulmonary edema in the setting of noncompliance. Difficulty affording medications and follow-up office copayments in the outpatient setting -says medications cost between $12 and $15 a month however, she does not currently have any income.  She received metolazone  on January 17 in addition IV Lasix  with 1.2 L output though only net negative of 290 mL in the setting of fairly significant oral fluid intake.  Weight recorded down 4.7 kg this morning.  BUN and creatinine higher at 29/1.53 with bicarb increased to 32.  She continues to have 1+ lower extremity edema to above her posterior knees as well as JVD and increased abdominal girth.  I/o likely inaccurate (or wt).  With rise in BUN/creat, will hold lasix  today.  Continue to follow renal function closely.  Continue beta-blocker, Entresto , spironolactone , digoxin  (will need to hold if renal fxn worsens), and Farxiga .     2.  Demand ischemia: Mild troponin elevation  to 67 in the setting of above.  No chest pain.  Nonischemic Myoview  2021.  No plans for ischemic valuation at  this time.  Continue aspirin , statin, beta-blocker, and antiplatelet therapy.   3.  Primary hypertension: Stable.   4.  Hyperlipidemia: LDL 130 in the setting of noncompliance.  Continue statin.   5.  Type 2 diabetes mellitus: A1c 8.4 September.  Per medicine.   6.  History of substance abuse: Urine drug screen negative this admission.   7.  History of stroke: On dual antiplatelet therapy and statin.  Signed, Lonni Meager, NP  05/20/2024, 8:50 AM    For questions or updates, please contact   Please consult [email or web address] for contact info under Cardiology/STEMI.  "

## 2024-05-21 ENCOUNTER — Other Ambulatory Visit: Payer: Self-pay

## 2024-05-21 DIAGNOSIS — I5023 Acute on chronic systolic (congestive) heart failure: Secondary | ICD-10-CM | POA: Diagnosis not present

## 2024-05-21 LAB — BASIC METABOLIC PANEL WITH GFR
Anion gap: 10 (ref 5–15)
BUN: 31 mg/dL — ABNORMAL HIGH (ref 6–20)
CO2: 29 mmol/L (ref 22–32)
Calcium: 7.8 mg/dL — ABNORMAL LOW (ref 8.9–10.3)
Chloride: 98 mmol/L (ref 98–111)
Creatinine, Ser: 1.45 mg/dL — ABNORMAL HIGH (ref 0.44–1.00)
GFR, Estimated: 47 mL/min — ABNORMAL LOW
Glucose, Bld: 212 mg/dL — ABNORMAL HIGH (ref 70–99)
Potassium: 4.8 mmol/L (ref 3.5–5.1)
Sodium: 136 mmol/L (ref 135–145)

## 2024-05-21 LAB — GLUCOSE, CAPILLARY
Glucose-Capillary: 184 mg/dL — ABNORMAL HIGH (ref 70–99)
Glucose-Capillary: 179 mg/dL — ABNORMAL HIGH (ref 70–99)
Glucose-Capillary: 218 mg/dL — ABNORMAL HIGH (ref 70–99)
Glucose-Capillary: 223 mg/dL — ABNORMAL HIGH (ref 70–99)

## 2024-05-21 MED ORDER — FUROSEMIDE 10 MG/ML IJ SOLN
40.0000 mg | Freq: Two times a day (BID) | INTRAMUSCULAR | Status: DC
  Administered 2024-05-21 – 2024-05-23 (×5): 40 mg via INTRAVENOUS
  Filled 2024-05-21 (×5): qty 4

## 2024-05-21 MED ORDER — ENOXAPARIN SODIUM 60 MG/0.6ML IJ SOSY
0.5000 mg/kg | PREFILLED_SYRINGE | INTRAMUSCULAR | Status: DC
  Administered 2024-05-22: 55 mg via SUBCUTANEOUS
  Filled 2024-05-21 (×2): qty 0.6

## 2024-05-21 NOTE — Progress Notes (Signed)
 " PROGRESS NOTE    Molly Howard  FMW:[id number] DOB: 1985-12-11 DOA: 05/14/2024 PCP: [name], DO    Brief Narrative:  39 y.o. African-American female with medical history significant for GERD, chronic HFrEF, type diabetes mellitus, CKD stage 3a, nonischemic cardiomyopathy, polysubstance abuse and CVA, who presented to the emergency room with acute onset of worsening dyspnea over the last couple of days with associated orthopnea, paroxysmal nocturnal dyspnea and lower extremity edema.  She admitted to dry cough and wheezing.  She has been having dyspnea on exertion.  No fever or chills.  No nausea or vomiting or abdominal pain.  No dysuria, oliguria or hematuria or flank pain. She has reportedly been unable to    ED Course: BP was 174/116 with otherwise normal vital signs.  Labs revealed creatinine of 1.28 close to previous levels and calcium  6.8 with a BUN at 21.  proBNP was 8096.  High sensitive troponin T was 67 and later 64.  CBC showed hemoglobin 11.6 hematocrit 37.5. ZXH:dyntzi sinus rhythm at a rate of 74 with premature supraventricular complexes and possible left atrial enlargement and Q waves anteroseptally with T wave inversion inferolaterally. Imaging: 2 view chest x-ray showed pulm edema and increased interstitial markings as well as cardiomegaly with AICD in place.  Right foot x-ray showed no acute findings.   The patient was given 120 mg of IV Lasix  and admitted for CHF exacerbation.   Assessment and Plan:  Acute on Chronic HFrEF - Presented with dyspnea with elevated ProBNP and CXR findings of pulmonary edema   - TTE (02/2024) showed LVEF 25-30% with G3DD, mod reduced RV function, mod to severe TR - Has been out of home meds, unable to afford refills - Cardiology following - Continue Coreg  12.5 mg BID, Farxiga  10 mg, aldactone  25 mg, Entresto  97-103 mg BID, digoxin  0.0625 mg daily - Received IV Lasix  60 mg BID and Metolazone  5 mg once on 1/17 with slight increase in  Cr. IV Lasix  held on 1/18.  - Continued diuresis today per cardiology  Hypertensive urgency - resolved Primary Hypertension - BP elevated to 174/116 on admission - Possibly contributing to acute CHF exacerbation - Resolved with resumption of home meds - Continue Coreg  12.5 mg BID, Entresto  97-103 mg BID, aldactone  25 mg   Elevated troponins - Likely secondary to demand ischemia  - No chest pain currently - Cardiology indicating no plans for ischemic workup   CKD stage 3a - Overall stable at baseline, slight increase in Cr after diuresis with added metolazone  on 1/17. Now improving.  CAD - Continue home aspirin , Plavix , Lipitor  T2DM - Hgb A1c (1/18) 9.2 - Previously unable to tolerate metformin . Unable to afford Ozempic .  - On Farxiga  10 mg daily - SSI 0-9 AC - Accu checks  Dyslipidemia - Continue Lipitor 40 mg    Scheduled Meds:  aspirin  EC  81 mg Oral Daily   atorvastatin   40 mg Oral Daily   carvedilol   12.5 mg Oral BID WC   clopidogrel   75 mg Oral Daily   dapagliflozin  propanediol  10 mg Oral Daily   digoxin   0.0625 mg Oral Daily   [START ON 05/22/2024] enoxaparin  (LOVENOX ) injection  0.5 mg/kg Subcutaneous Q24H   ferrous sulfate   325 mg Oral BID   furosemide   40 mg Intravenous BID   insulin  aspart  0-9 Units Subcutaneous TID WC   sacubitril -valsartan   1 tablet Oral BID   spironolactone   25 mg Oral Daily   Continuous Infusions: PRN  Meds:.acetaminophen  **OR** acetaminophen , guaiFENesin -dextromethorphan , magnesium  hydroxide, ondansetron  **OR** ondansetron  (ZOFRAN ) IV, oxyCODONE , traZODone   Current Outpatient Medications  Medication Instructions   Accu-Chek Softclix Lancets lancets Use as instructed   Aspirin  Low Dose 81 mg, Oral, Daily, Swallow whole.   atorvastatin  (LIPITOR) 40 mg, Oral, Daily   carvedilol  (COREG ) 12.5 mg, Oral, 2 times daily with meals   clopidogrel  (PLAVIX ) 75 mg, Oral, Daily   Continuous Glucose Sensor (FREESTYLE LIBRE 3 SENSOR) MISC Place  1 sensor on the skin every 14 days. Use to check glucose continuously   dapagliflozin  propanediol (FARXIGA ) 10 MG TABS tablet Take 1 tablet (10 mg total) by mouth once daily.   digoxin  (LANOXIN ) 0.0625 mg, Oral, Daily   FeroSul 325 mg, Oral, 2 times daily   Furoscix  80 mg, Subcutaneous, As directed   Ozempic  (0.25 or 0.5 MG/DOSE) 0.25 mg, Subcutaneous, Weekly   sacubitril -valsartan  (ENTRESTO ) 97-103 MG 1 tablet, Oral, 2 times daily   spironolactone  (ALDACTONE ) 25 mg, Oral, Daily   torsemide  (DEMADEX ) 60 mg, Oral, Daily    DVT prophylaxis:    Code Status:   Code Status: Full Code  Family Communication: None  Disposition Plan: Likely home pending clinical improvement  Level of care: Telemetry  Consultants:  Cardiology  Subjective:  Examined at bedside. Overall feels well. Still feels like she has fluid on her.    Objective: Vitals:   05/21/24 0933 05/21/24 1236 05/21/24 1641 05/21/24 1959  BP: 120/71 116/81 (!) 138/93 117/81  Pulse: 66 60 63 64  Resp:  16 18 18   Temp:  97.9 F (36.6 C) 98 F (36.7 C) 97.7 F (36.5 C)  TempSrc:  Oral  Oral  SpO2:  99% 97% 95%  Weight:      Height:        Intake/Output Summary (Last 24 hours) at 05/21/2024 2118 Last data filed at 05/21/2024 1918 Gross per 24 hour  Intake 600 ml  Output --  Net 600 ml   Filed Weights   05/19/24 0500 05/20/24 0456 05/21/24 0500  Weight: 118.8 kg 114.1 kg 112.2 kg    Examination:  Gen: NAD, A&Ox3 Neck: Supple CV: RRR, no murmurs Resp: normal WOB, CTAB, no wheezing or rhonchi Abd: Soft, NTND, no guarding, BS normoactive Ext: 1+  LE edema Skin: Warm, dry, no rashes/lesions Neuro: CN II-XII grossly intact, strength 5/5 b/l, sensation intact, normal gait Psych: Calm, cooperative, very flat affect    Data Reviewed: I have personally reviewed following labs and imaging studies  CBC: Recent Labs  Lab 05/16/24 0802 05/17/24 0511 05/18/24 0601  WBC 4.9 5.2 4.7  HGB 11.3* 11.5* 11.3*   HCT 35.9* 37.5 36.8  MCV 92.1 95.2 94.4  PLT 271 289 305   Basic Metabolic Panel: Recent Labs  Lab 05/16/24 0802 05/17/24 0511 05/18/24 0601 05/19/24 0812 05/20/24 0350 05/21/24 0513  NA 141 136 138 137 136 136  K 3.5 4.4 4.5 4.7 4.9 4.8  CL 102 98 98 98 95* 98  CO2 31 30 31 31  32 29  GLUCOSE 131* 187* 203* 148* 202* 212*  BUN 20 20 22* 23* 29* 31*  CREATININE 1.33* 1.41* 1.41* 1.41* 1.53* 1.45*  CALCIUM  6.8* 6.6* 7.2* 7.5* 7.8* 7.8*  MG 1.6* 1.9  --   --   --   --    GFR: Estimated Creatinine Clearance: 67.3 mL/min (A) (by C-G formula based on SCr of 1.45 mg/dL (H)). Liver Function Tests: Recent Labs  Lab 05/16/24 0802  AST 13*  ALT 6  ALKPHOS 112  BILITOT 0.7  PROT 5.3*  ALBUMIN  2.2*   No results for input(s): LIPASE, AMYLASE in the last 168 hours. No results for input(s): AMMONIA in the last 168 hours. Coagulation Profile: No results for input(s): INR, PROTIME in the last 168 hours. Cardiac Enzymes: No results for input(s): CKTOTAL, CKMB, CKMBINDEX, TROPONINI in the last 168 hours. BNP (last 3 results) Recent Labs    04/20/24 1317 05/14/24 1917  PROBNP 6,696.0* 8,096.0*   HbA1C: Recent Labs    05/20/24 1348  HGBA1C 9.2*   CBG: Recent Labs  Lab 05/20/24 2301 05/21/24 0823 05/21/24 1237 05/21/24 1639 05/21/24 2116  GLUCAP 228* 184* 179* 218* 223*   Lipid Profile: No results for input(s): CHOL, HDL, LDLCALC, TRIG, CHOLHDL, LDLDIRECT in the last 72 hours. Thyroid  Function Tests: No results for input(s): TSH, T4TOTAL, FREET4, T3FREE, THYROIDAB in the last 72 hours. Anemia Panel: No results for input(s): VITAMINB12, FOLATE, FERRITIN, TIBC, IRON , RETICCTPCT in the last 72 hours. Sepsis Labs: No results for input(s): PROCALCITON, LATICACIDVEN in the last 168 hours.  No results found for this or any previous visit (from the past 240 hours).   Radiology Studies: No results  found.   Scheduled Meds:  aspirin  EC  81 mg Oral Daily   atorvastatin   40 mg Oral Daily   carvedilol   12.5 mg Oral BID WC   clopidogrel   75 mg Oral Daily   dapagliflozin  propanediol  10 mg Oral Daily   digoxin   0.0625 mg Oral Daily   [START ON 05/22/2024] enoxaparin  (LOVENOX ) injection  0.5 mg/kg Subcutaneous Q24H   ferrous sulfate   325 mg Oral BID   furosemide   40 mg Intravenous BID   insulin  aspart  0-9 Units Subcutaneous TID WC   sacubitril -valsartan   1 tablet Oral BID   spironolactone   25 mg Oral Daily   Continuous Infusions:   Unresulted Labs (From admission, onward)     Start     Ordered   05/22/24 0500  CBC  Tomorrow morning,   R        05/21/24 1033             LOS:  LOS: 6 days   Time Spent: 35 minutes  Mackayla Mullins Al-Sultani, MD [facility]  If 7PM-7AM, please contact night-coverage  05/21/2024, 9:18 PM      "

## 2024-05-21 NOTE — Plan of Care (Signed)

## 2024-05-21 NOTE — Progress Notes (Signed)
 Heart Failure Stewardship Pharmacy Note  PCP: [name], DO PCP-Cardiologist: Redell Cave, MD  HPI: Molly Howard is a 39 y.o. female with a history of DM, HTN, previous drug use, NSVT, PSVT, mod/severe TR, CKD, CVS 10/25, anemia and chronic biventricular heart failure who presented with SOB and right foot pain.   On admission, proBNP was 8096, HS-troponin was 64, Scr 1.28, K 3.6, Ca 6.8. Chest XR on admission noted pulmonary edema and increased interstitial markings.   Pertinent cardiac history:  Echo 10/11/20: EF 30-35% along with moderate TR.  Echo 03/20/21: EF 25-30% along with severely elevated PA pressure of 70.8 mmHg, mild LAE, mild MR and mild/moderate TR Cardiac MRI 05/19/21:  Mild LV dilatation, mild hypertrophy, and mild systolic dysfunction (EF 42%), no LGE, normal RV size and systolic function (EF 50%). Echo 07/25/22: EF 25-30% with moderately elevated PA pressure of 50.7 mmHg, mild LAE, mild MR, small pericardial effusion and moderate/ severe TR  Echo with bubble study 02/21/24: EF 25-30%, moderate LVH, G3DD, abnormal GLS of -4.2%, RV moderately reduced, mod/severe TR, no interatrial shunt   Pertinent Lab Values: Creat  Date Value Ref Range Status  01/16/2024 1.30 (H) 0.50 - 0.97 mg/dL Final   Creatinine, Ser  Date Value Ref Range Status  05/21/2024 1.45 (H) 0.44 - 1.00 mg/dL Final   BUN  Date Value Ref Range Status  05/21/2024 31 (H) 6 - 20 mg/dL Final  97/89/7974 15 6 - 20 mg/dL Final   Potassium  Date Value Ref Range Status  05/21/2024 4.8 3.5 - 5.1 mmol/L Final   Sodium  Date Value Ref Range Status  05/21/2024 136 135 - 145 mmol/L Final  06/13/2023 142 134 - 144 mmol/L Final   B Natriuretic Peptide  Date Value Ref Range Status  01/04/2024 2,362.6 (H) 0.0 - 100.0 pg/mL Final    Comment:    Performed at [facility], [address], [place], [place] [address]   Magnesium   Date Value Ref Range Status  05/17/2024 1.9 1.7 - 2.4 mg/dL  Final    Comment:    Performed at [facility], [address] [place], [place] [address]   Hgb A1c MFr Bld  Date Value Ref Range Status  05/20/2024 9.2 (H) 4.8 - 5.6 % Final    Comment:    (NOTE) Diagnosis of Diabetes The following HbA1c ranges recommended by the American Diabetes Association (ADA) may be used as an aid in the diagnosis of diabetes mellitus.  Hemoglobin             Suggested A1C NGSP%              Diagnosis  <5.7                   Non Diabetic  5.7-6.4                Pre-Diabetic  >6.4                   Diabetic  <7.0                   Glycemic control for                       adults with diabetes.     Digoxin  Level  Date Value Ref Range Status  01/11/2024 0.9 0.8 - 2.0 ng/mL Final    Comment:    Performed at [facility] [address] [place], [place] [address]   TSH  Date Value Ref Range Status  11/09/2021 5.114 (H) 0.350 - 4.500 uIU/mL Final    Comment:    Performed by a 3rd Generation assay with a functional sensitivity of <=0.01 uIU/mL. Performed at [facility], [address] [place], [place] [address]    LDH  Date Value Ref Range Status  04/15/2023 216 (H) 98 - 192 U/L Final    Comment:    Performed at [facility], [address] [place], [place] [address]    Vital Signs:  Temp:  [97.6 F (36.4 C)-98.3 F (36.8 C)] 97.7 F (36.5 C) (01/19 0439) Pulse Rate:  [61-68] 61 (01/19 0439) Cardiac Rhythm: Normal sinus rhythm (01/19 0700) Resp:  [17-20] 20 (01/19 0439) BP: (119-148)/(71-94) 119/77 (01/19 0439) SpO2:  [91 %-100 %] 91 % (01/19 0439) Weight:  [112.2 kg (247 lb 5.7 oz)] 112.2 kg (247 lb 5.7 oz) (01/19 0500)  Intake/Output Summary (Last 24 hours) at 05/21/2024 9178 Last data filed at 05/20/2024 2000 Gross per 24 hour  Intake 480 ml  Output --  Net 480 ml    Current Heart Failure Medications:  Loop diuretic: furosemide  60 mg IV q12h  Beta-Blocker: carvedilol  12.5 mg  BID ACEI/ARB/ARNI: Entresto  97/103 mg BID  MRA: spironolactone  25 mg daily  SGLT2i: Farxiga  10 mg daily  Other:atorvastatin  40 mg daily, digoxin  0.0625 mg daily   Prior to admission Heart Failure Medications:  Loop diuretic: torsemide  60 mg daily  Beta-Blocker: carvedilol  12.5 mg BID  ACEI/ARB/ARNI: Entresto  97/103 mg BID  MRA: spironolactone  25 mg daily  SGLT2i: Farxiga  10 mg daily  Other:atorvastatin  40 mg daily, digoxin  0.0625 mg daily   Assessment: 1.   Acute on chronic systolic heart failure (LVEF 25-30%)  , due to previous cocaine use and uncontrolled HTN. NYHA class III symptoms.   -Symptoms: Reports SOB has improved to baseline, mild swelling, appetite is good. Reports right foot pain improved, but still aches. -Volume: Appears slightly hypervolemic, mild LEE. Reports large clear urine output. Down 5.7 kg since admit. Agree with restarting diuresis today with reduced dose furosemide  40 mg IV BID. Can consider adding metolazone  5 mg if additional diuresis needed. -Hemodynamics: labile BP, HR 60s-80s. -BB: Continue carvedilol  12.5 mg BID. -ACEI/ARB/ARNI: Continue Entresto  97/103 mg BID. -MRA: Continue spironolactone  25 mg daily. K 4.8.  -SGLT2i: Continue Farxiga  10 mg daily. A1c 9.2%.  Plan: 1) Medication changes recommended at this time: - Agree with restarting diuresis today with reduced dose furosemide  40 mg IV BID.   2) Patient assistance: - Copay for Entresto , Farxiga , Jardaince are $4 each. Patient can use charge account if unable to provide copay at time of discharge.  3) Education: - Patient has been educated on current HF medications and potential additions to HF medication regimen - Patient verbalizes understanding that over the next few months, these medication doses may change and more medications may be added to optimize HF regimen - Patient has been educated on basic disease state pathophysiology and goals of therapy   Calton Nash, [email or web address] PharmD Candidate    Please do not hesitate to reach out with questions or concerns,  Jaun Bash, PharmD, CPP, BCPS, [facility] Heart Failure Pharmacist  Phone - [phone] 05/21/2024 8:21 AM

## 2024-05-21 NOTE — Progress Notes (Signed)
 "  Rounding Note   Patient Name: Molly Howard Date of Encounter: 05/21/2024  [facility] Cardiologist: Redell Cave, MD   Subjective Patient reports improvement in breathing. Remains volume overloaded with bilateral LE edema. Kidney function improved slightly overnight after holding evening dose. Will resume diuresis today at lower dose.   Scheduled Meds:  aspirin  EC  81 mg Oral Daily   atorvastatin   40 mg Oral Daily   carvedilol   12.5 mg Oral BID WC   clopidogrel   75 mg Oral Daily   dapagliflozin  propanediol  10 mg Oral Daily   digoxin   0.0625 mg Oral Daily   enoxaparin  (LOVENOX ) injection  0.5 mg/kg Subcutaneous Q24H   ferrous sulfate   325 mg Oral BID   insulin  aspart  0-9 Units Subcutaneous TID WC   sacubitril -valsartan   1 tablet Oral BID   spironolactone   25 mg Oral Daily   Continuous Infusions:  PRN Meds: acetaminophen  **OR** acetaminophen , guaiFENesin -dextromethorphan , magnesium  hydroxide, ondansetron  **OR** ondansetron  (ZOFRAN ) IV, oxyCODONE , traZODone    Vital Signs  Vitals:   05/21/24 0439 05/21/24 0500 05/21/24 0832 05/21/24 0933  BP: 119/77  (!) 101/46 120/71  Pulse: 61  86 66  Resp: 20  17   Temp: 97.7 F (36.5 C)  98.2 F (36.8 C)   TempSrc: Oral     SpO2: 91%  95%   Weight:  112.2 kg    Height:        Intake/Output Summary (Last 24 hours) at 05/21/2024 1024 Last data filed at 05/21/2024 1019 Gross per 24 hour  Intake 360 ml  Output --  Net 360 ml      05/21/2024    5:00 AM 05/20/2024    4:56 AM 05/19/2024    5:00 AM  Last 3 Weights  Weight (lbs) 247 lb 5.7 oz 251 lb 8.7 oz 261 lb 14.5 oz  Weight (kg) 112.2 kg 114.1 kg 118.8 kg      Telemetry Sinus rhythm with PVCs and NSVT up to 6 beats - Personally Reviewed  Physical Exam  GEN: No acute distress.   Neck: + JVP Cardiac: RRR, no murmurs, rubs, or gallops.  Respiratory: Clear to auscultation bilaterally. GI: Soft, nontender, non-distended  MS: 1+ bilateral LE edema; No  deformity. Neuro:  Nonfocal  Psych: Normal affect   Labs High Sensitivity Troponin:  No results for input(s): TROPONINIHS in the last 720 hours.  Recent Labs  Lab 05/14/24 1917 05/14/24 2341  TRNPT 67* 64*       Chemistry Recent Labs  Lab 05/16/24 0802 05/17/24 0511 05/18/24 0601 05/19/24 0812 05/20/24 0350 05/21/24 0513  NA 141 136   < > 137 136 136  K 3.5 4.4   < > 4.7 4.9 4.8  CL 102 98   < > 98 95* 98  CO2 31 30   < > 31 32 29  GLUCOSE 131* 187*   < > 148* 202* 212*  BUN 20 20   < > 23* 29* 31*  CREATININE 1.33* 1.41*   < > 1.41* 1.53* 1.45*  CALCIUM  6.8* 6.6*   < > 7.5* 7.8* 7.8*  MG 1.6* 1.9  --   --   --   --   PROT 5.3*  --   --   --   --   --   ALBUMIN  2.2*  --   --   --   --   --   AST 13*  --   --   --   --   --  ALT 6  --   --   --   --   --   ALKPHOS 112  --   --   --   --   --   BILITOT 0.7  --   --   --   --   --   GFRNONAA 52* 48*   < > 48* 44* 47*  ANIONGAP 9 8   < > 7 9 10    < > = values in this interval not displayed.    Lipids No results for input(s): CHOL, TRIG, HDL, LABVLDL, LDLCALC, CHOLHDL in the last 168 hours.  Hematology Recent Labs  Lab 05/16/24 0802 05/17/24 0511 05/18/24 0601  WBC 4.9 5.2 4.7  RBC 3.90 3.94 3.90  HGB 11.3* 11.5* 11.3*  HCT 35.9* 37.5 36.8  MCV 92.1 95.2 94.4  MCH 29.0 29.2 29.0  MCHC 31.5 30.7 30.7  RDW 15.2 14.9 15.1  PLT 271 289 305   Thyroid  No results for input(s): TSH, FREET4 in the last 168 hours.  BNP Recent Labs  Lab 05/14/24 1917  PROBNP 8,096.0*    DDimer No results for input(s): DDIMER in the last 168 hours.   Radiology  No results found.  Cardiac Studies  08/2023 2D echo 1. Left ventricular ejection fraction, by estimation, is 30 to 35%. The  left ventricle has moderate to severely decreased function. The left  ventricle demonstrates global hypokinesis. There is mild left ventricular  hypertrophy. Left ventricular  diastolic parameters are indeterminate.   2.  Right ventricular systolic function is low normal. The right  ventricular size is normal.   3. The mitral valve is normal in structure. Mild mitral valve  regurgitation.   4. Tricuspid valve regurgitation is mild to moderate.   5. The aortic valve is tricuspid. Aortic valve regurgitation is not  visualized.   6. The inferior vena cava is normal in size with <50% respiratory  variability, suggesting right atrial pressure of 8 mmHg.   Patient Profile   39 y.o. female with a history of chronic HFrEF, nonischemic cardiomyopathy, hypertension, stage II chronic kidney disease, diabetes, GERD, and polysubstance abuse, who was admitted January 12 with recurrent heart failure in the setting of noncompliance.   Assessment & Plan   Acute on chronic HFrEF Nonischemic cardiomyopathy - Admitted 1/12 with progressive volume overload and dyspnea due to noncompliance in the outpatient setting. Patient reports difficulty affording medications and copayments for OP follow up. Meds cost $12-15 per month but she does not have an income currently.  - Net output -1.5 L since admission (suspect incomplete recording) with weight down about 13 lbs - Received metolazone  1/17 in addition to IV Lasix  with renal function slightly worsened yesterday. Evening dose of Lasix  held.  - Renal function improving some today. Patient remains volume overloaded. - Resume IV Lasix  at 40 mg twice daily - Continue to monitor kidney function, strict I/Os, and daily weights with ongoing diuresis - Continue carvedilol  12.5 mg twice daily, Farxiga  10 mg daily, digoxin  0.0625 daily, Entresto  97-103 mg twice daily, and spironolactone  25 mg daily  Demand ischemia - Most recent ischemic evaluation in 2021 with nonischemic Myoview  - Troponin minimally elevated to 67 in the setting of above - No chest pain - No plans for ischemic evaluation at this time - Continue aspirin , statin, beta-blocker, and antiplatelet therapy  Primary  hypertension - Stable on current regimen  Hyperlipidemia - Continue statin therapy  T2DM - A1c of 8.4 in September - Ongoing management per  IM  Hx substance abuse - UDS negative this admission  Hx stroke - On DAPT and statin   For questions or updates, please contact [facility] Please consult [email or web address] for contact info under       Signed, Lesley LITTIE Maffucci, PA-C  05/21/2024, 10:24 AM    "

## 2024-05-21 NOTE — TOC Initial Note (Addendum)
 Transition of Care [facility]) - Initial/Assessment Note    Patient Details  Name: Molly Howard MRN: [id number] Date of Birth: 1985-10-30  Transition of Care [facility]) CM/SW Contact:    Shasta DELENA Daring, RN Phone Number: 05/21/2024, 3:51 PM  Clinical Narrative:                 RNCM met with patient. States she lives in a single family home. Initially she said she lived with friends but then stated she lives with her sister. She will call friends and family for transportation when needed, but said they are often unable to help her. She uses [facility] outpatient pharmacy. She struggles with affording medication. She said the pharmacy will just send her a bill.  She denies having any HH services or any DME already in the home.  Will add transportation resources to AVS and will ask pharmacy to review med list for cost saving alternatives  Will follow for discharge readiness.  Patient will need cab voucher at discharge.  Expected Discharge Plan: Home/Self Care Barriers to Discharge: Continued Medical Work up   Patient Goals and CMS Choice            Expected Discharge Plan and Services       Living arrangements for the past 2 months: Single Family Home                                      Prior Living Arrangements/Services Living arrangements for the past 2 months: Single Family Home   Patient language and need for interpreter reviewed:: Yes        Need for Family Participation in Patient Care: Yes (Comment) Care giver support system in place?: Yes (comment)   Criminal Activity/Legal Involvement Pertinent to Current Situation/Hospitalization: No - Comment as needed  Activities of Daily Living   ADL Screening (condition at time of admission) Independently performs ADLs?: No Does the patient have a NEW difficulty with bathing/dressing/toileting/self-feeding that is expected to last >3 days?: No Does the patient have a NEW difficulty with getting in/out of bed, walking, or climbing  stairs that is expected to last >3 days?: No Does the patient have a NEW difficulty with communication that is expected to last >3 days?: No Is the patient deaf or have difficulty hearing?: No Does the patient have difficulty seeing, even when wearing glasses/contacts?: No Does the patient have difficulty concentrating, remembering, or making decisions?: No  Permission Sought/Granted Permission sought to share information with : Family Supports Permission granted to share information with : Yes, Verbal Permission Granted              Emotional Assessment Appearance:: Appears stated age Attitude/Demeanor/Rapport: Gracious, Engaged Affect (typically observed): Appropriate Orientation: : Oriented to Self, Oriented to Place, Oriented to  Time, Oriented to Situation Alcohol / Substance Use: Not Applicable Psych Involvement: No (comment)  Admission diagnosis:  Shortness of breath [R06.02] Acute pulmonary edema (HCC) [J81.0] Right foot pain [M79.671] Acute CHF (congestive heart failure) (HCC) [I50.9] Type 2 diabetes mellitus with stage 3a chronic kidney disease, without long-term current use of insulin  (HCC) [Z88.77, N18.31] Patient Active Problem List   Diagnosis Date Noted   Acute on chronic systolic CHF (congestive heart failure) (HCC) 05/15/2024   Hypertensive urgency 05/15/2024   Coronary artery disease 05/15/2024   Acute CHF (congestive heart failure) (HCC) 05/15/2024   Acute CVA (cerebrovascular accident) (HCC) 02/20/2024  Facial droop 01/06/2024   Right sided weakness 01/06/2024   Expressive aphasia 01/04/2024   History of medication noncompliance 10/12/2023   CHF (congestive heart failure) (HCC) 10/11/2023   Class 1 obesity 08/31/2023   Hyponatremia 08/31/2023   Acute on chronic HFrEF (heart failure with reduced ejection fraction) (HCC) 08/28/2023   COPD exacerbation (HCC) 08/28/2023   Acute kidney injury superimposed on CKD 08/15/2023   Chest pain 08/07/2023   Type 2  diabetes mellitus without complications (HCC) 08/07/2023   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   Primary hypertension 04/16/2023   Controlled type 2 diabetes mellitus without complication, without long-term current use of insulin  (HCC) 04/15/2023   Cardiogenic shock (HCC) 11/14/2022   Bilateral leg edema 11/06/2022   NICM (nonischemic cardiomyopathy) (HCC) 07/31/2022   Hypocalcemia 07/25/2022   Leg edema 07/25/2022   Acute on chronic combined systolic and diastolic CHF (congestive heart failure) (HCC) 07/25/2022   GERD without esophagitis 07/24/2022   Cocaine abuse (HCC) 02/12/2022   Acute pulmonary edema (HCC)    Acute systolic heart failure (HCC) 02/09/2022   Syncope 02/09/2022   Menorrhagia with regular cycle 06/22/2021   Dyslipidemia 11/27/2020   Type II diabetes mellitus with renal manifestations (HCC) 11/27/2020   Chronic kidney disease (CKD), stage II (mild) 11/27/2020   Hypomagnesemia 10/17/2020   Uncontrolled type 2 diabetes mellitus with hyperglycemia, without long-term current use of insulin  (HCC) 10/11/2020   Dyspnea 05/21/2020   Essential hypertension 05/20/2020   Diabetes mellitus, type II (HCC) 05/20/2020   Chronic anemia 05/20/2020   Reactive thrombocytosis 05/20/2020   Obesity (BMI 30-39.9) 05/20/2020   Chronic combined systolic and diastolic heart failure (HCC) 11/01/2019   Financial difficulties 11/01/2019   Medication management 11/01/2019   Lung nodule 11/01/2019   Nonischemic cardiomyopathy (HCC) 11/01/2019   Polysubstance abuse (HCC) 11/01/2019   Tobacco use 11/01/2019   HFrEF (heart failure with reduced ejection fraction) (HCC)    Swelling    Anemia 10/02/2019   PCP:  [name], DO Pharmacy:   [facility] [address] [place] [address] Phone: [phone] Fax: [phone]  [facility] - [facility] [address] [place]  [address] Phone: [phone] Fax: [phone]     Social Drivers of Health (SDOH) Social History: SDOH Screenings   Food Insecurity: No Food Insecurity (05/18/2024)  Housing: High Risk (05/18/2024)  Transportation Needs: Unmet Transportation Needs (05/18/2024)  Utilities: Not At Risk (05/18/2024)  Alcohol Screen: Low Risk (04/15/2023)  Depression (PHQ2-9): Low Risk (01/16/2024)  Financial Resource Strain: High Risk (08/25/2023)  Social Connections: Unknown (08/28/2023)  Tobacco Use: Medium Risk (05/14/2024)   SDOH Interventions: Housing Interventions: Inpatient TOC (Resources added to AVS.) Transportation Interventions: Inpatient TOC (Resources added to AVS.)   Readmission Risk Interventions    05/21/2024    3:44 PM 02/22/2024    3:27 PM 01/06/2024   10:15 AM  Readmission Risk Prevention Plan  Transportation Screening Complete Complete Complete  Medication Review Oceanographer) Complete Complete Referral to Pharmacy  PCP or Specialist appointment within 3-5 days of discharge -- Complete Complete  HRI or Home Care Consult Complete  Complete  SW Recovery Care/Counseling Consult  Complete Complete  Palliative Care Screening Not Applicable Not Applicable Not Applicable  Skilled Nursing Facility Not Applicable Not Applicable Not Applicable

## 2024-05-21 NOTE — Inpatient Diabetes Management (Signed)
 Inpatient Diabetes Program Recommendations  AACE/ADA: New Consensus Statement on Inpatient Glycemic Control   Target Ranges:  Prepandial:   less than 140 mg/dL      Peak postprandial:   less than 180 mg/dL (1-2 hours)      Critically ill patients:  140 - 180 mg/dL    Latest Reference Range & Units 05/20/24 13:29 05/20/24 16:14 05/20/24 23:01 05/21/24 08:23 05/21/24 12:37  Glucose-Capillary 70 - 99 mg/dL 824 (H) 756 (H) 771 (H) 184 (H) 179 (H)    Latest Reference Range & Units 08/06/23 18:58 01/16/24 13:39 05/20/24 13:48  Hemoglobin A1C 4.8 - 5.6 % 6.7 (H) 8.4 (H) 9.2 (H)   Review of Glycemic Control  Diabetes history: DM2 Outpatient Diabetes medications: Farxiga  10 mg daily, Ozempic  0.25 mg Qweek (not taking) Current orders for Inpatient glycemic control: Novolog  0-9 units TID, Farxiga  10 mg daily  Inpatient Diabetes Program Recommendations:    HbgA1C:  A1C 9.2% on 05/20/24 indicating an average glucose of 217 mg/dl over the past 2-3 months.  Outpatient DM: May want to consider adding additional oral DM medication outpatient to get DM under better control. Patient reports she does not tolerate Metformin .  NOTE: Spoke with patient at bedside about diabetes and home regimen for diabetes control. Patient reports being followed by PCP for diabetes management and currently taking Farxiga  10 mg daily as an outpatient for diabetes control. Patient reports she is not taking Ozempic  because the copay is too expensive.  Patient reports that she checks glucose via finger sticks and it is usually in the 100's in the morning and goes up during the day in the 200's.  Discussed A1C results (9.2% on 05/20/24) and explained that current A1C indicates an average glucose of 217 mg/dl over the past 2-3 months. Discussed glucose and A1C goals. Discussed importance of checking CBGs and maintaining good CBG control to prevent long-term and short-term complications. Explained how hyperglycemia leads to damage within  blood vessels which lead to the common complications seen with uncontrolled diabetes.  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 Stressed to the patient the importance of improving glycemic control to prevent further complications from uncontrolled diabetes. Discussed that she will likely need additional DM medication outpatient. Patient states she has tried Metformin  in the past and did not tolerate it. Patient would be willing to take additional oral DM medication if prescribed at discharge.  Discussed impact of nutrition, exercise, stress, sickness, and medications on diabetes control.  Encouraged patent to follow up with PCP regarding DM. Patient states that it is difficult to get to appointments due to transportation barriers. Inquired about using public transportation resources and patient states that she had used Transit system in the past; they get her to appointments on time but she has to wait at the office 3-4 hours to be picked up after her appointment. Informed patient I would consult TOC team to ask them to provide transportation resources that she could use for appointments. Stressed to patient that she needed to go to appointments to ensure she can continue to get needed prescriptions.  Patient verbalized understanding of information discussed and reports no further questions at this time related to diabetes.  Sent chat message to [facility] to see if DPP-4 medication covered under insurance, but all DPP-4 meds require prior authorization so not able to see if they would be affordable. Due to hx of living in car and food insecurity, would  not recommend to use sulfonylureas due to increased of hypoglycemia.   Thanks, Earnie Gainer, RN, MSN, CDE Diabetes Coordinator Inpatient Diabetes Program [phone] (Team Pager)

## 2024-05-21 NOTE — Plan of Care (Signed)

## 2024-05-22 DIAGNOSIS — I5023 Acute on chronic systolic (congestive) heart failure: Secondary | ICD-10-CM | POA: Diagnosis not present

## 2024-05-22 DIAGNOSIS — R7989 Other specified abnormal findings of blood chemistry: Secondary | ICD-10-CM | POA: Diagnosis not present

## 2024-05-22 DIAGNOSIS — I160 Hypertensive urgency: Secondary | ICD-10-CM | POA: Diagnosis not present

## 2024-05-22 LAB — BASIC METABOLIC PANEL WITH GFR
Anion gap: 8 (ref 5–15)
BUN: 33 mg/dL — ABNORMAL HIGH (ref 6–20)
CO2: 31 mmol/L (ref 22–32)
Calcium: 8.3 mg/dL — ABNORMAL LOW (ref 8.9–10.3)
Chloride: 97 mmol/L — ABNORMAL LOW (ref 98–111)
Creatinine, Ser: 1.56 mg/dL — ABNORMAL HIGH (ref 0.44–1.00)
GFR, Estimated: 43 mL/min — ABNORMAL LOW
Glucose, Bld: 156 mg/dL — ABNORMAL HIGH (ref 70–99)
Potassium: 4.9 mmol/L (ref 3.5–5.1)
Sodium: 136 mmol/L (ref 135–145)

## 2024-05-22 LAB — GLUCOSE, CAPILLARY
Glucose-Capillary: 158 mg/dL — ABNORMAL HIGH (ref 70–99)
Glucose-Capillary: 307 mg/dL — ABNORMAL HIGH (ref 70–99)
Glucose-Capillary: 175 mg/dL — ABNORMAL HIGH (ref 70–99)
Glucose-Capillary: 245 mg/dL — ABNORMAL HIGH (ref 70–99)

## 2024-05-22 LAB — CBC
HCT: 36 % (ref 36.0–46.0)
Hemoglobin: 11.2 g/dL — ABNORMAL LOW (ref 12.0–15.0)
MCH: 28.9 pg (ref 26.0–34.0)
MCHC: 31.1 g/dL (ref 30.0–36.0)
MCV: 93 fL (ref 80.0–100.0)
Platelets: 332 K/uL (ref 150–400)
RBC: 3.87 MIL/uL (ref 3.87–5.11)
RDW: 14.7 % (ref 11.5–15.5)
WBC: 4.9 K/uL (ref 4.0–10.5)
nRBC: 0 % (ref 0.0–0.2)

## 2024-05-22 MED ORDER — INSULIN ASPART 100 UNIT/ML IJ SOLN
0.0000 [IU] | Freq: Three times a day (TID) | INTRAMUSCULAR | Status: DC
  Administered 2024-05-22: 3 [IU] via SUBCUTANEOUS
  Administered 2024-05-23: 5 [IU] via SUBCUTANEOUS
  Filled 2024-05-22: qty 2
  Filled 2024-05-22: qty 3

## 2024-05-22 NOTE — Progress Notes (Signed)
 "   Heart Failure Stewardship Pharmacy Note  PCP: [name], DO PCP-Cardiologist: Redell Cave, MD  HPI: Molly Howard is a 39 y.o. female with a history of DM, HTN, previous drug use, NSVT, PSVT, mod/severe TR, CKD, CVS 10/25, anemia and chronic biventricular heart failure who presented with SOB and right foot pain.   On admission, proBNP was 8096, HS-troponin was 64, Scr 1.28, K 3.6, Ca 6.8. Chest XR on admission noted pulmonary edema and increased interstitial markings.   Pertinent cardiac history:  Echo 10/11/20: EF 30-35% along with moderate TR.  Echo 03/20/21: EF 25-30% along with severely elevated PA pressure of 70.8 mmHg, mild LAE, mild MR and mild/moderate TR Cardiac MRI 05/19/21:  Mild LV dilatation, mild hypertrophy, and mild systolic dysfunction (EF 42%), no LGE, normal RV size and systolic function (EF 50%). Echo 07/25/22: EF 25-30% with moderately elevated PA pressure of 50.7 mmHg, mild LAE, mild MR, small pericardial effusion and moderate/ severe TR  Echo with bubble study 02/21/24: EF 25-30%, moderate LVH, G3DD, abnormal GLS of -4.2%, RV moderately reduced, mod/severe TR, no interatrial shunt  Pertinent Lab Values: Creat  Date Value Ref Range Status  01/16/2024 1.30 (H) 0.50 - 0.97 mg/dL Final   Creatinine, Ser  Date Value Ref Range Status  05/21/2024 1.45 (H) 0.44 - 1.00 mg/dL Final   BUN  Date Value Ref Range Status  05/21/2024 31 (H) 6 - 20 mg/dL Final  97/89/7974 15 6 - 20 mg/dL Final   Potassium  Date Value Ref Range Status  05/21/2024 4.8 3.5 - 5.1 mmol/L Final   Sodium  Date Value Ref Range Status  05/21/2024 136 135 - 145 mmol/L Final  06/13/2023 142 134 - 144 mmol/L Final   B Natriuretic Peptide  Date Value Ref Range Status  01/04/2024 2,362.6 (H) 0.0 - 100.0 pg/mL Final    Comment:    Performed at [facility], [address] [place], [place] [address]   Magnesium   Date Value Ref Range Status  05/17/2024 1.9 1.7 - 2.4 mg/dL  Final    Comment:    Performed at [facility], [address] [place], [place] [address]   Hgb A1c MFr Bld  Date Value Ref Range Status  05/20/2024 9.2 (H) 4.8 - 5.6 % Final    Comment:    (NOTE) Diagnosis of Diabetes The following HbA1c ranges recommended by the American Diabetes Association (ADA) may be used as an aid in the diagnosis of diabetes mellitus.  Hemoglobin             Suggested A1C NGSP%              Diagnosis  <5.7                   Non Diabetic  5.7-6.4                Pre-Diabetic  >6.4                   Diabetic  <7.0                   Glycemic control for                       adults with diabetes.     Digoxin  Level  Date Value Ref Range Status  01/11/2024 0.9 0.8 - 2.0 ng/mL Final    Comment:    Performed at [facility]  [facility], [address] [place], [place] [address]   TSH  Date Value Ref Range Status  11/09/2021 5.114 (H) 0.350 - 4.500 uIU/mL Final    Comment:    Performed by a 3rd Generation assay with a functional sensitivity of <=0.01 uIU/mL. Performed at [facility], [address] [place], [place] [address]    LDH  Date Value Ref Range Status  04/15/2023 216 (H) 98 - 192 U/L Final    Comment:    Performed at [facility], [address] [place], [place] [address]    Vital Signs:  Temp:  [97.7 F (36.5 C)-98.3 F (36.8 C)] 98.3 F (36.8 C) (01/20 0025) Pulse Rate:  [59-86] 59 (01/20 0025) Cardiac Rhythm: Normal sinus rhythm (01/19 1900) Resp:  [16-18] 17 (01/20 0025) BP: (101-138)/(46-97) 120/97 (01/20 0025) SpO2:  [95 %-99 %] 99 % (01/20 0025) Weight:  [112.2 kg (247 lb 5.7 oz)] 112.2 kg (247 lb 5.7 oz) (01/19 0500)  Intake/Output Summary (Last 24 hours) at 05/22/2024 0439 Last data filed at 05/21/2024 1945 Gross per 24 hour  Intake 600 ml  Output 250 ml  Net 350 ml    Current Heart Failure Medications:  Loop diuretic: furosemide  40 mg IV q12h  Beta-Blocker: carvedilol  12.5 mg  BID ACEI/ARB/ARNI: Entresto  97/103 mg BID  MRA: spironolactone  25 mg daily  SGLT2i: Farxiga  10 mg daily  Other: atorvastatin  40 mg daily, digoxin  0.0625 mg daily   Prior to admission Heart Failure Medications:  Loop diuretic: torsemide  60 mg daily  Beta-Blocker: carvedilol  12.5 mg BID  ACEI/ARB/ARNI: Entresto  97/103 mg BID  MRA: spironolactone  25 mg daily  SGLT2i: Farxiga  10 mg daily  Other: atorvastatin  40 mg daily, digoxin  0.0625 mg daily   Assessment: 1.   Acute on chronic systolic heart failure (LVEF 25-30%), due to previous cocaine use and uncontrolled HTN. NYHA class III symptoms.   -Symptoms: Reports SOB has improved to baseline, mild swelling, appetite is good. Reports right foot pain improved, but still aches. Denies DOE. -Volume: Appears slightly hypervolemic, mild LEE. Reports large clear urine output. I/Os inaccurate. Down 6.2 kg since admit. Renal function slightly worsened today, however still volume overloaded. Agree with continuing furosemide  40 mg IV BID.  -Hemodynamics: labile BP, HR 60s-80s. -BB: Continue carvedilol  12.5 mg BID. Consider optimizing to target dose tomorrow pending resolution of hypervolemia for improved mortality benefit. -ACEI/ARB/ARNI: Continue Entresto  97/103 mg BID. BP tolerating. -MRA: Continue spironolactone  25 mg daily. K 4.8. Scr 1.53>1.28 on admit. -SGLT2i: Continue Farxiga  10 mg daily. A1c 9.2%.  Plan: 1) Medication changes recommended at this time: - None at this time.  2) Patient assistance: - Copay for Entresto , Farxiga , Jardiance are $4 each. Patient can use charge account if unable to provide copay at time of discharge.  3) Education: - Patient has been educated on current HF medications and potential additions to HF medication regimen - Patient verbalizes understanding that over the next few months, these medication doses may change and more medications may be added to optimize HF regimen - Patient has been educated on basic  disease state pathophysiology and goals of therapy   Calton Nash, [email or web address] PharmD Candidate   Please do not hesitate to reach out with questions or concerns,  Jaun Bash, PharmD, CPP, BCPS, [facility] Heart Failure Pharmacist  Phone - [phone] 05/22/2024 4:39 AM  "

## 2024-05-22 NOTE — Progress Notes (Incomplete)
 " Molly Howard  FMW:[id number] DOB: 1986-04-24 DOA: 05/14/2024 PCP: [name], DO    Brief Narrative:  39 y.o. African-American female with medical history significant for GERD, chronic HFrEF, type diabetes mellitus, CKD stage 3a, nonischemic cardiomyopathy, polysubstance abuse and CVA, who presented to the emergency room with acute onset of worsening dyspnea over the last couple of days with associated orthopnea, paroxysmal nocturnal dyspnea and lower extremity edema.  She admitted to dry cough and wheezing.  She has been having dyspnea on exertion.  No fever or chills.  No nausea or vomiting or abdominal pain.  No dysuria, oliguria or hematuria or flank pain. She has reportedly been unable to    ED Course: BP was 174/116 with otherwise normal vital signs.  Labs revealed creatinine of 1.28 close to previous levels and calcium  6.8 with a BUN at 21.  proBNP was 8096.  High sensitive troponin T was 67 and later 64.  CBC showed hemoglobin 11.6 hematocrit 37.5. ZXH:dyntzi sinus rhythm at a rate of 74 with premature supraventricular complexes and possible left atrial enlargement and Q waves anteroseptally with T wave inversion inferolaterally. Imaging: 2 view chest x-ray showed pulm edema and increased interstitial markings as well as cardiomegaly with AICD in place.  Right foot x-ray showed no acute findings.   The patient was given 120 mg of IV Lasix  and admitted for CHF exacerbation.   Assessment and Plan:  Acute on Chronic HFrEF - Presented with dyspnea with elevated ProBNP and CXR findings of pulmonary edema   - TTE (02/2024) showed LVEF 25-30% with G3DD, mod reduced RV function, mod to severe TR - Has been out of home meds, unable to afford refills - Cardiology following - Continue Coreg  12.5 mg BID, Farxiga  10 mg, aldactone  25 mg, Entresto  97-103 mg BID, digoxin  0.0625 mg daily - Received IV Lasix  60 mg BID and Metolazone  5 mg once on 1/17 with slight increase in  Cr. IV Lasix  held on 1/18.  - Continued diuresis today per cardiology  Hypertensive urgency - resolved Primary Hypertension - BP elevated to 174/116 on admission - Possibly contributing to acute CHF exacerbation - Resolved with resumption of home meds - Continue Coreg  12.5 mg BID, Entresto  97-103 mg BID, aldactone  25 mg   Elevated troponins - Likely secondary to demand ischemia  - No chest pain currently - Cardiology indicating no plans for ischemic workup   CKD stage 3a - Overall stable at baseline, slight increase in Cr after diuresis with added metolazone  on 1/17. Now improving.  CAD - Continue home aspirin , Plavix , Lipitor  T2DM - Hgb A1c (1/18) 9.2 - Previously unable to tolerate metformin . Unable to afford Ozempic .  - On Farxiga  10 mg daily - SSI 0-9 AC - Accu checks  Dyslipidemia - Continue Lipitor 40 mg    Scheduled Meds:  aspirin  EC  81 mg Oral Daily   atorvastatin   40 mg Oral Daily   carvedilol   12.5 mg Oral BID WC   clopidogrel   75 mg Oral Daily   dapagliflozin  propanediol  10 mg Oral Daily   digoxin   0.0625 mg Oral Daily   enoxaparin  (LOVENOX ) injection  0.5 mg/kg Subcutaneous Q24H   ferrous sulfate   325 mg Oral BID   furosemide   40 mg Intravenous BID   insulin  aspart  0-15 Units Subcutaneous TID WC   sacubitril -valsartan   1 tablet Oral BID   spironolactone   25 mg Oral Daily   Continuous Infusions: PRN Meds:.acetaminophen  **OR** acetaminophen ,  guaiFENesin -dextromethorphan , magnesium  hydroxide, ondansetron  **OR** ondansetron  (ZOFRAN ) IV, oxyCODONE , traZODone   Current Outpatient Medications  Medication Instructions   Accu-Chek Softclix Lancets lancets Use as instructed   Aspirin  Low Dose 81 mg, Oral, Daily, Swallow whole.   atorvastatin  (LIPITOR) 40 mg, Oral, Daily   carvedilol  (COREG ) 12.5 mg, Oral, 2 times daily with meals   clopidogrel  (PLAVIX ) 75 mg, Oral, Daily   Continuous Glucose Sensor (FREESTYLE LIBRE 3 SENSOR) MISC Place 1  sensor on the skin every 14 days. Use to check glucose continuously   dapagliflozin  propanediol (FARXIGA ) 10 MG TABS tablet Take 1 tablet (10 mg total) by mouth once daily.   digoxin  (LANOXIN ) 0.0625 mg, Oral, Daily   FeroSul 325 mg, Oral, 2 times daily   Furoscix  80 mg, Subcutaneous, As directed   Ozempic  (0.25 or 0.5 MG/DOSE) 0.25 mg, Subcutaneous, Weekly   sacubitril -valsartan  (ENTRESTO ) 97-103 MG 1 tablet, Oral, 2 times daily   spironolactone  (ALDACTONE ) 25 mg, Oral, Daily   torsemide  (DEMADEX ) 60 mg, Oral, Daily    DVT prophylaxis:    Code Status:   Code Status: Full Code  Family Communication: None  Disposition Plan: Likely home pending clinical improvement  Level of care: Telemetry  Consultants:  Cardiology  Subjective:  Examined at bedside. Overall feels well. Still feels like she has fluid on her.    Objective: Vitals:   05/22/24 1144 05/22/24 1649 05/22/24 2010 05/22/24 2334  BP: 135/89 (!) 146/92 (!) 147/89 123/76  Pulse: 62 65 68 62  Resp: 16 18 18 18   Temp: 97.9 F (36.6 C) 98 F (36.7 C) 98.4 F (36.9 C) 98.1 F (36.7 C)  TempSrc: Oral Oral Oral   SpO2: 98% 100% 99% 98%  Weight:      Height:        Intake/Output Summary (Last 24 hours) at 05/22/2024 2352 Last data filed at 05/22/2024 2014 Gross per 24 hour  Intake 360 ml  Output 750 ml  Net -390 ml   Filed Weights   05/20/24 0456 05/21/24 0500 05/22/24 0500  Weight: 114.1 kg 112.2 kg 112.6 kg    Examination:  Gen: NAD, A&Ox3 Neck: Supple CV: RRR, no murmurs Resp: normal WOB, CTAB, no wheezing or rhonchi Abd: Soft, NTND, no guarding, BS normoactive Ext: 1+  LE edema Skin: Warm, dry, no rashes/lesions Neuro: CN II-XII grossly intact, strength 5/5 b/l, sensation intact, normal gait Psych: Calm, cooperative, very flat affect    Data Reviewed: I have personally reviewed following labs and imaging studies  CBC: Recent Labs  Lab 05/16/24 0802 05/17/24 0511 05/18/24 0601  05/22/24 0317  WBC 4.9 5.2 4.7 4.9  HGB 11.3* 11.5* 11.3* 11.2*  HCT 35.9* 37.5 36.8 36.0  MCV 92.1 95.2 94.4 93.0  PLT 271 289 305 332   Basic Metabolic Panel: Recent Labs  Lab 05/16/24 0802 05/17/24 0511 05/18/24 0601 05/19/24 0812 05/20/24 0350 05/21/24 0513 05/22/24 0812  NA 141 136 138 137 136 136 136  K 3.5 4.4 4.5 4.7 4.9 4.8 4.9  CL 102 98 98 98 95* 98 97*  CO2 31 30 31 31  32 29 31  GLUCOSE 131* 187* 203* 148* 202* 212* 156*  BUN 20 20 22* 23* 29* 31* 33*  CREATININE 1.33* 1.41* 1.41* 1.41* 1.53* 1.45* 1.56*  CALCIUM  6.8* 6.6* 7.2* 7.5* 7.8* 7.8* 8.3*  MG 1.6* 1.9  --   --   --   --   --    GFR: Estimated Creatinine Clearance: 62.7 mL/min (A) (by C-G formula based  on SCr of 1.56 mg/dL (H)). Liver Function Tests: Recent Labs  Lab 05/16/24 0802  AST 13*  ALT 6  ALKPHOS 112  BILITOT 0.7  PROT 5.3*  ALBUMIN  2.2*   No results for input(s): LIPASE, AMYLASE in the last 168 hours. No results for input(s): AMMONIA in the last 168 hours. Coagulation Profile: No results for input(s): INR, PROTIME in the last 168 hours. Cardiac Enzymes: No results for input(s): CKTOTAL, CKMB, CKMBINDEX, TROPONINI in the last 168 hours. BNP (last 3 results) Recent Labs    04/20/24 1317 05/14/24 1917  PROBNP 6,696.0* 8,096.0*   HbA1C: Recent Labs    05/20/24 1348  HGBA1C 9.2*   CBG: Recent Labs  Lab 05/21/24 2116 05/22/24 0816 05/22/24 1143 05/22/24 1648 05/22/24 2103  GLUCAP 223* 158* 307* 175* 245*   Lipid Profile: No results for input(s): CHOL, HDL, LDLCALC, TRIG, CHOLHDL, LDLDIRECT in the last 72 hours. Thyroid  Function Tests: No results for input(s): TSH, T4TOTAL, FREET4, T3FREE, THYROIDAB in the last 72 hours. Anemia Panel: No results for input(s): VITAMINB12, FOLATE, FERRITIN, TIBC, IRON , RETICCTPCT in the last 72 hours. Sepsis Labs: No results for input(s): PROCALCITON, LATICACIDVEN in the last 168  hours.  No results found for this or any previous visit (from the past 240 hours).   Radiology Studies: No results found.   Scheduled Meds:  aspirin  EC  81 mg Oral Daily   atorvastatin   40 mg Oral Daily   carvedilol   12.5 mg Oral BID WC   clopidogrel   75 mg Oral Daily   dapagliflozin  propanediol  10 mg Oral Daily   digoxin   0.0625 mg Oral Daily   enoxaparin  (LOVENOX ) injection  0.5 mg/kg Subcutaneous Q24H   ferrous sulfate   325 mg Oral BID   furosemide   40 mg Intravenous BID   insulin  aspart  0-15 Units Subcutaneous TID WC   sacubitril -valsartan   1 tablet Oral BID   spironolactone   25 mg Oral Daily   Continuous Infusions:   Unresulted Labs (From admission, onward)     Start     Ordered   05/23/24 0500  Basic metabolic panel with GFR  Tomorrow morning,   R        05/22/24 0742   05/23/24 0500  CBC  Tomorrow morning,   R        05/22/24 0742             LOS:  LOS: 7 days   Time Spent: 35 minutes  Tylisa Alcivar Al-Sultani, MD [facility]  If 7PM-7AM, please contact night-coverage  05/22/2024, 11:52 PM      "

## 2024-05-22 NOTE — Progress Notes (Signed)
 "  Rounding Note   Patient Name: Molly Howard Date of Encounter: 05/22/2024  [facility] Cardiologist: Redell Cave, MD   Subjective Patient reports breathing is stable since yesterday. UOP poorly recorded yesterday. Weight unchanged. Remains volume overloaded on exam.   Scheduled Meds:  aspirin  EC  81 mg Oral Daily   atorvastatin   40 mg Oral Daily   carvedilol   12.5 mg Oral BID WC   clopidogrel   75 mg Oral Daily   dapagliflozin  propanediol  10 mg Oral Daily   digoxin   0.0625 mg Oral Daily   enoxaparin  (LOVENOX ) injection  0.5 mg/kg Subcutaneous Q24H   ferrous sulfate   325 mg Oral BID   furosemide   40 mg Intravenous BID   insulin  aspart  0-9 Units Subcutaneous TID WC   sacubitril -valsartan   1 tablet Oral BID   spironolactone   25 mg Oral Daily   Continuous Infusions:  PRN Meds: acetaminophen  **OR** acetaminophen , guaiFENesin -dextromethorphan , magnesium  hydroxide, ondansetron  **OR** ondansetron  (ZOFRAN ) IV, oxyCODONE , traZODone    Vital Signs  Vitals:   05/22/24 0500 05/22/24 0524 05/22/24 0749 05/22/24 1144  BP:  120/69 112/75 135/89  Pulse:  63 63 62  Resp:  18 18 16   Temp:  98.5 F (36.9 C) 98.1 F (36.7 C) 97.9 F (36.6 C)  TempSrc:   Oral Oral  SpO2:  93% 95% 98%  Weight: 112.6 kg     Height:        Intake/Output Summary (Last 24 hours) at 05/22/2024 1200 Last data filed at 05/22/2024 0900 Gross per 24 hour  Intake 600 ml  Output 250 ml  Net 350 ml      05/22/2024    5:00 AM 05/21/2024    5:00 AM 05/20/2024    4:56 AM  Last 3 Weights  Weight (lbs) 248 lb 3.8 oz 247 lb 5.7 oz 251 lb 8.7 oz  Weight (kg) 112.6 kg 112.2 kg 114.1 kg      Telemetry Sinus rhythm with PVCs and NSVT up to 6 beats - Personally Reviewed  Physical Exam  GEN: No acute distress.   Neck: + JVP Cardiac: RRR, no murmurs, rubs, or gallops.  Respiratory: Clear to auscultation bilaterally. GI: Soft, nontender, non-distended  MS: 1+ bilateral LE edema; No  deformity. Neuro:  Nonfocal  Psych: Normal affect   Labs High Sensitivity Troponin:  No results for input(s): TROPONINIHS in the last 720 hours.  Recent Labs  Lab 05/14/24 1917 05/14/24 2341  TRNPT 67* 64*       Chemistry Recent Labs  Lab 05/16/24 0802 05/17/24 0511 05/18/24 0601 05/20/24 0350 05/21/24 0513 05/22/24 0812  NA 141 136   < > 136 136 136  K 3.5 4.4   < > 4.9 4.8 4.9  CL 102 98   < > 95* 98 97*  CO2 31 30   < > 32 29 31  GLUCOSE 131* 187*   < > 202* 212* 156*  BUN 20 20   < > 29* 31* 33*  CREATININE 1.33* 1.41*   < > 1.53* 1.45* 1.56*  CALCIUM  6.8* 6.6*   < > 7.8* 7.8* 8.3*  MG 1.6* 1.9  --   --   --   --   PROT 5.3*  --   --   --   --   --   ALBUMIN  2.2*  --   --   --   --   --   AST 13*  --   --   --   --   --  ALT 6  --   --   --   --   --   ALKPHOS 112  --   --   --   --   --   BILITOT 0.7  --   --   --   --   --   GFRNONAA 52* 48*   < > 44* 47* 43*  ANIONGAP 9 8   < > 9 10 8    < > = values in this interval not displayed.    Lipids No results for input(s): CHOL, TRIG, HDL, LABVLDL, LDLCALC, CHOLHDL in the last 168 hours.  Hematology Recent Labs  Lab 05/17/24 0511 05/18/24 0601 05/22/24 0317  WBC 5.2 4.7 4.9  RBC 3.94 3.90 3.87  HGB 11.5* 11.3* 11.2*  HCT 37.5 36.8 36.0  MCV 95.2 94.4 93.0  MCH 29.2 29.0 28.9  MCHC 30.7 30.7 31.1  RDW 14.9 15.1 14.7  PLT 289 305 332   Thyroid  No results for input(s): TSH, FREET4 in the last 168 hours.  BNP No results for input(s): BNP, PROBNP in the last 168 hours.   DDimer No results for input(s): DDIMER in the last 168 hours.   Radiology  No results found.  Cardiac Studies  08/2023 2D echo 1. Left ventricular ejection fraction, by estimation, is 30 to 35%. The  left ventricle has moderate to severely decreased function. The left  ventricle demonstrates global hypokinesis. There is mild left ventricular  hypertrophy. Left ventricular  diastolic parameters are  indeterminate.   2. Right ventricular systolic function is low normal. The right  ventricular size is normal.   3. The mitral valve is normal in structure. Mild mitral valve  regurgitation.   4. Tricuspid valve regurgitation is mild to moderate.   5. The aortic valve is tricuspid. Aortic valve regurgitation is not  visualized.   6. The inferior vena cava is normal in size with <50% respiratory  variability, suggesting right atrial pressure of 8 mmHg.   Patient Profile   39 y.o. female with a history of chronic HFrEF, nonischemic cardiomyopathy, hypertension, stage II chronic kidney disease, diabetes, GERD, and polysubstance abuse, who was admitted January 12 with recurrent heart failure in the setting of noncompliance.   Assessment & Plan   Acute on chronic HFrEF Nonischemic cardiomyopathy - Admitted 1/12 with progressive volume overload and dyspnea due to noncompliance in the outpatient setting. Patient reports difficulty affording medications and copayments for OP follow up. Meds cost $12-15 per month but she does not have an income currently.  - Received metolazone  1/17 in addition to IV Lasix  with renal function slightly worsened yesterday. Evening dose of Lasix  held.  - Remains volume overloaded on exam - Continue IV Lasix  at 40 mg twice daily - Continue to monitor kidney function, strict I/Os, and daily weights with ongoing diuresis - Continue carvedilol  12.5 mg twice daily, Farxiga  10 mg daily, digoxin  0.0625 daily, Entresto  97-103 mg twice daily, and spironolactone  25 mg daily  Demand ischemia - Most recent ischemic evaluation in 2021 with nonischemic Myoview  - Troponin minimally elevated to 67 in the setting of above - No chest pain - No plans for ischemic evaluation at this time - Continue aspirin , statin, beta-blocker, and antiplatelet therapy  Primary hypertension - Stable on current regimen  Hyperlipidemia - Continue statin therapy  T2DM - A1c of 8.4 in  September - Ongoing management per IM  Hx substance abuse - UDS negative this admission  Hx stroke - On DAPT and statin  For questions or updates, please contact [facility] Please consult [email or web address] for contact info under       Signed, Lesley LITTIE Maffucci, PA-C  05/22/2024, 12:00 PM    "

## 2024-05-22 NOTE — Plan of Care (Signed)

## 2024-05-22 NOTE — Progress Notes (Addendum)
 " PROGRESS NOTE    Molly Howard  FMW:[id number] DOB: 03-19-1986 DOA: 05/14/2024 PCP: [name], DO    Brief Narrative:  39 y.o. African-American female with medical history significant for GERD, chronic HFrEF, type diabetes mellitus, CKD stage 3a, nonischemic cardiomyopathy, polysubstance abuse and CVA, who presented to the emergency room with acute onset of worsening dyspnea over the last couple of days with associated orthopnea, paroxysmal nocturnal dyspnea and lower extremity edema.  She admitted to dry cough and wheezing.  She has been having dyspnea on exertion.  No fever or chills.  No nausea or vomiting or abdominal pain.  No dysuria, oliguria or hematuria or flank pain. She has reportedly been unable to    ED Course: BP was 174/116 with otherwise normal vital signs.  Labs revealed creatinine of 1.28 close to previous levels and calcium  6.8 with a BUN at 21.  proBNP was 8096.  High sensitive troponin T was 67 and later 64.  CBC showed hemoglobin 11.6 hematocrit 37.5. ZXH:dyntzi sinus rhythm at a rate of 74 with premature supraventricular complexes and possible left atrial enlargement and Q waves anteroseptally with T wave inversion inferolaterally. Imaging: 2 view chest x-ray showed pulm edema and increased interstitial markings as well as cardiomegaly with AICD in place.  Right foot x-ray showed no acute findings.   The patient was given 120 mg of IV Lasix  and admitted for CHF exacerbation.   Assessment and Plan:  Acute on Chronic HFrEF - Presented with dyspnea with elevated ProBNP and CXR findings of pulmonary edema   - TTE (02/2024) showed LVEF 25-30% with G3DD, mod reduced RV function, mod to severe TR - Has been out of home meds, unable to afford refills - Cardiology following - Continue Coreg  12.5 mg BID, Farxiga  10 mg, aldactone  25 mg, Entresto  97-103 mg BID, digoxin  0.0625 mg daily - Received IV Lasix  60 mg BID and Metolazone  5 mg once on 1/17 with slight increase in  Cr. IV Lasix  held on 1/18.  - Continued diuresis today per cardiology  Hypertensive urgency - resolved Primary Hypertension - BP elevated to 174/116 on admission - Possibly contributing to acute CHF exacerbation - Resolved with resumption of home meds - Continue Coreg  12.5 mg BID, Entresto  97-103 mg BID, aldactone  25 mg   Elevated troponins - Likely secondary to demand ischemia  - No chest pain currently - Cardiology indicating no plans for ischemic workup   CKD stage 3a - Overall stable at baseline, slight increase in Cr after diuresis with added metolazone  on 1/17. Now improving.  CAD - Continue home aspirin , Plavix , Lipitor  T2DM - Hgb A1c (1/18) 9.2 - Previously unable to tolerate metformin . Unable to afford Ozempic .  - On Farxiga  10 mg daily - SSI 0-9 AC - Accu checks  Dyslipidemia - Continue Lipitor 40 mg   CVA - Continue DAPT and Lipitor  Scheduled Meds:  aspirin  EC  81 mg Oral Daily   atorvastatin   40 mg Oral Daily   carvedilol   12.5 mg Oral BID WC   clopidogrel   75 mg Oral Daily   dapagliflozin  propanediol  10 mg Oral Daily   digoxin   0.0625 mg Oral Daily   enoxaparin  (LOVENOX ) injection  0.5 mg/kg Subcutaneous Q24H   ferrous sulfate   325 mg Oral BID   furosemide   40 mg Intravenous BID   insulin  aspart  0-15 Units Subcutaneous TID WC   sacubitril -valsartan   1 tablet Oral BID   spironolactone   25 mg Oral Daily  Continuous Infusions: PRN Meds:.acetaminophen  **OR** acetaminophen , guaiFENesin -dextromethorphan , magnesium  hydroxide, ondansetron  **OR** ondansetron  (ZOFRAN ) IV, oxyCODONE , traZODone   Current Outpatient Medications  Medication Instructions   Accu-Chek Softclix Lancets lancets Use as instructed   Aspirin  Low Dose 81 mg, Oral, Daily, Swallow whole.   atorvastatin  (LIPITOR) 40 mg, Oral, Daily   carvedilol  (COREG ) 12.5 mg, Oral, 2 times daily with meals   clopidogrel  (PLAVIX ) 75 mg, Oral, Daily   Continuous Glucose Sensor (FREESTYLE LIBRE 3  SENSOR) MISC Place 1 sensor on the skin every 14 days. Use to check glucose continuously   dapagliflozin  propanediol (FARXIGA ) 10 MG TABS tablet Take 1 tablet (10 mg total) by mouth once daily.   digoxin  (LANOXIN ) 0.0625 mg, Oral, Daily   FeroSul 325 mg, Oral, 2 times daily   Furoscix  80 mg, Subcutaneous, As directed   Ozempic  (0.25 or 0.5 MG/DOSE) 0.25 mg, Subcutaneous, Weekly   sacubitril -valsartan  (ENTRESTO ) 97-103 MG 1 tablet, Oral, 2 times daily   spironolactone  (ALDACTONE ) 25 mg, Oral, Daily   torsemide  (DEMADEX ) 60 mg, Oral, Daily    DVT prophylaxis:    Code Status:   Code Status: Full Code  Family Communication: None  Disposition Plan: Likely home pending clinical improvement, cardiology clearance  Level of care: Telemetry  Consultants:  Cardiology  Subjective:  Examined at bedside.  More upbeat today. Feels like her lower extremity swelling is still substantial without much improvement. No other complaints   Objective: Vitals:   05/22/24 1144 05/22/24 1649 05/22/24 2010 05/22/24 2334  BP: 135/89 (!) 146/92 (!) 147/89 123/76  Pulse: 62 65 68 62  Resp: 16 18 18 18   Temp: 97.9 F (36.6 C) 98 F (36.7 C) 98.4 F (36.9 C) 98.1 F (36.7 C)  TempSrc: Oral Oral Oral   SpO2: 98% 100% 99% 98%  Weight:      Height:        Intake/Output Summary (Last 24 hours) at 05/22/2024 2352 Last data filed at 05/22/2024 2014 Gross per 24 hour  Intake 360 ml  Output 750 ml  Net -390 ml   Filed Weights   05/20/24 0456 05/21/24 0500 05/22/24 0500  Weight: 114.1 kg 112.2 kg 112.6 kg    Examination:  Gen: NAD, A&Ox3 Neck: Supple CV: RRR, no murmurs Resp: normal WOB, CTAB, no wheezing or rhonchi Abd: Soft, NTND, no guarding, BS normoactive Ext: 1+ LE edema Skin: Warm, dry, no rashes/lesions Neuro: CN II-XII grossly intact, strength 5/5 b/l, sensation intact, normal gait Psych: Calm, cooperative, very flat affect    Data Reviewed: I have personally reviewed following  labs and imaging studies  CBC: Recent Labs  Lab 05/16/24 0802 05/17/24 0511 05/18/24 0601 05/22/24 0317  WBC 4.9 5.2 4.7 4.9  HGB 11.3* 11.5* 11.3* 11.2*  HCT 35.9* 37.5 36.8 36.0  MCV 92.1 95.2 94.4 93.0  PLT 271 289 305 332   Basic Metabolic Panel: Recent Labs  Lab 05/16/24 0802 05/17/24 0511 05/18/24 0601 05/19/24 0812 05/20/24 0350 05/21/24 0513 05/22/24 0812  NA 141 136 138 137 136 136 136  K 3.5 4.4 4.5 4.7 4.9 4.8 4.9  CL 102 98 98 98 95* 98 97*  CO2 31 30 31 31  32 29 31  GLUCOSE 131* 187* 203* 148* 202* 212* 156*  BUN 20 20 22* 23* 29* 31* 33*  CREATININE 1.33* 1.41* 1.41* 1.41* 1.53* 1.45* 1.56*  CALCIUM  6.8* 6.6* 7.2* 7.5* 7.8* 7.8* 8.3*  MG 1.6* 1.9  --   --   --   --   --  GFR: Estimated Creatinine Clearance: 62.7 mL/min (A) (by C-G formula based on SCr of 1.56 mg/dL (H)). Liver Function Tests: Recent Labs  Lab 05/16/24 0802  AST 13*  ALT 6  ALKPHOS 112  BILITOT 0.7  PROT 5.3*  ALBUMIN  2.2*   No results for input(s): LIPASE, AMYLASE in the last 168 hours. No results for input(s): AMMONIA in the last 168 hours. Coagulation Profile: No results for input(s): INR, PROTIME in the last 168 hours. Cardiac Enzymes: No results for input(s): CKTOTAL, CKMB, CKMBINDEX, TROPONINI in the last 168 hours. BNP (last 3 results) Recent Labs    04/20/24 1317 05/14/24 1917  PROBNP 6,696.0* 8,096.0*   HbA1C: Recent Labs    05/20/24 1348  HGBA1C 9.2*   CBG: Recent Labs  Lab 05/21/24 2116 05/22/24 0816 05/22/24 1143 05/22/24 1648 05/22/24 2103  GLUCAP 223* 158* 307* 175* 245*   Lipid Profile: No results for input(s): CHOL, HDL, LDLCALC, TRIG, CHOLHDL, LDLDIRECT in the last 72 hours. Thyroid  Function Tests: No results for input(s): TSH, T4TOTAL, FREET4, T3FREE, THYROIDAB in the last 72 hours. Anemia Panel: No results for input(s): VITAMINB12, FOLATE, FERRITIN, TIBC, IRON , RETICCTPCT in the last  72 hours. Sepsis Labs: No results for input(s): PROCALCITON, LATICACIDVEN in the last 168 hours.  No results found for this or any previous visit (from the past 240 hours).   Radiology Studies: No results found.   Scheduled Meds:  aspirin  EC  81 mg Oral Daily   atorvastatin   40 mg Oral Daily   carvedilol   12.5 mg Oral BID WC   clopidogrel   75 mg Oral Daily   dapagliflozin  propanediol  10 mg Oral Daily   digoxin   0.0625 mg Oral Daily   enoxaparin  (LOVENOX ) injection  0.5 mg/kg Subcutaneous Q24H   ferrous sulfate   325 mg Oral BID   furosemide   40 mg Intravenous BID   insulin  aspart  0-15 Units Subcutaneous TID WC   sacubitril -valsartan   1 tablet Oral BID   spironolactone   25 mg Oral Daily   Continuous Infusions:   Unresulted Labs (From admission, onward)     Start     Ordered   05/23/24 0500  Basic metabolic panel with GFR  Tomorrow morning,   R        05/22/24 0742   05/23/24 0500  CBC  Tomorrow morning,   R        05/22/24 0742             LOS:  LOS: 7 days   Time Spent: 35 minutes  Nery Kalisz Al-Sultani, MD [facility]  If 7PM-7AM, please contact night-coverage  05/22/2024, 11:52 PM      "

## 2024-05-23 ENCOUNTER — Other Ambulatory Visit: Payer: Self-pay

## 2024-05-23 ENCOUNTER — Telehealth (HOSPITAL_COMMUNITY): Payer: Self-pay

## 2024-05-23 ENCOUNTER — Other Ambulatory Visit (HOSPITAL_COMMUNITY): Payer: Self-pay

## 2024-05-23 DIAGNOSIS — E1122 Type 2 diabetes mellitus with diabetic chronic kidney disease: Secondary | ICD-10-CM | POA: Diagnosis not present

## 2024-05-23 DIAGNOSIS — J810 Acute pulmonary edema: Secondary | ICD-10-CM | POA: Diagnosis not present

## 2024-05-23 DIAGNOSIS — R0602 Shortness of breath: Secondary | ICD-10-CM | POA: Diagnosis not present

## 2024-05-23 DIAGNOSIS — I5043 Acute on chronic combined systolic (congestive) and diastolic (congestive) heart failure: Secondary | ICD-10-CM

## 2024-05-23 LAB — CBC
HCT: 40.5 % (ref 36.0–46.0)
Hemoglobin: 12.5 g/dL (ref 12.0–15.0)
MCH: 28.7 pg (ref 26.0–34.0)
MCHC: 30.9 g/dL (ref 30.0–36.0)
MCV: 92.9 fL (ref 80.0–100.0)
Platelets: 354 K/uL (ref 150–400)
RBC: 4.36 MIL/uL (ref 3.87–5.11)
RDW: 14.7 % (ref 11.5–15.5)
WBC: 4.8 K/uL (ref 4.0–10.5)
nRBC: 0 % (ref 0.0–0.2)

## 2024-05-23 LAB — BASIC METABOLIC PANEL WITH GFR
Anion gap: 8 (ref 5–15)
BUN: 37 mg/dL — ABNORMAL HIGH (ref 6–20)
CO2: 33 mmol/L — ABNORMAL HIGH (ref 22–32)
Calcium: 8.8 mg/dL — ABNORMAL LOW (ref 8.9–10.3)
Chloride: 95 mmol/L — ABNORMAL LOW (ref 98–111)
Creatinine, Ser: 1.77 mg/dL — ABNORMAL HIGH (ref 0.44–1.00)
GFR, Estimated: 37 mL/min — ABNORMAL LOW
Glucose, Bld: 198 mg/dL — ABNORMAL HIGH (ref 70–99)
Potassium: 4.9 mmol/L (ref 3.5–5.1)
Sodium: 136 mmol/L (ref 135–145)

## 2024-05-23 LAB — GLUCOSE, CAPILLARY: Glucose-Capillary: 220 mg/dL — ABNORMAL HIGH (ref 70–99)

## 2024-05-23 MED ORDER — DAPAGLIFLOZIN PROPANEDIOL 10 MG PO TABS
10.0000 mg | ORAL_TABLET | Freq: Every day | ORAL | 11 refills | Status: AC
  Filled 2024-05-23: qty 30, 30d supply, fill #0

## 2024-05-23 MED ORDER — SACUBITRIL-VALSARTAN 97-103 MG PO TABS
1.0000 | ORAL_TABLET | Freq: Two times a day (BID) | ORAL | 11 refills | Status: AC
  Filled 2024-05-23: qty 60, 30d supply, fill #0

## 2024-05-23 MED ORDER — CARVEDILOL 12.5 MG PO TABS
12.5000 mg | ORAL_TABLET | Freq: Two times a day (BID) | ORAL | 1 refills | Status: AC
  Filled 2024-05-23: qty 180, 90d supply, fill #0

## 2024-05-23 MED ORDER — ENOXAPARIN SODIUM 60 MG/0.6ML IJ SOSY: 50.0000 mg | PREFILLED_SYRINGE | INTRAMUSCULAR | Status: DC

## 2024-05-23 MED ORDER — TORSEMIDE 20 MG PO TABS
60.0000 mg | ORAL_TABLET | Freq: Every day | ORAL | 3 refills | Status: AC
  Filled 2024-05-23: qty 270, 90d supply, fill #0

## 2024-05-23 MED ORDER — SPIRONOLACTONE 25 MG PO TABS
25.0000 mg | ORAL_TABLET | Freq: Every day | ORAL | 1 refills | Status: AC
  Filled 2024-05-23: qty 90, 90d supply, fill #0

## 2024-05-23 MED ORDER — CLOPIDOGREL BISULFATE 75 MG PO TABS
75.0000 mg | ORAL_TABLET | Freq: Every day | ORAL | 1 refills | Status: AC
  Filled 2024-05-23: qty 90, 90d supply, fill #0

## 2024-05-23 MED ORDER — ATORVASTATIN CALCIUM 40 MG PO TABS
40.0000 mg | ORAL_TABLET | Freq: Every day | ORAL | 12 refills | Status: AC
  Filled 2024-05-23: qty 30, 30d supply, fill #0

## 2024-05-23 MED ORDER — LINAGLIPTIN 5 MG PO TABS
5.0000 mg | ORAL_TABLET | Freq: Every day | ORAL | 3 refills | Status: DC
  Filled 2024-05-23 (×2): qty 30, 30d supply, fill #0

## 2024-05-23 MED ORDER — TORSEMIDE 20 MG PO TABS: 60.0000 mg | ORAL_TABLET | Freq: Every day | ORAL | Status: DC

## 2024-05-23 MED ORDER — DIGOXIN 125 MCG PO TABS
0.0625 mg | ORAL_TABLET | Freq: Every day | ORAL | 1 refills | Status: AC
  Filled 2024-05-23: qty 45, 90d supply, fill #0

## 2024-05-23 MED ORDER — ASPIRIN 81 MG PO TBEC
81.0000 mg | DELAYED_RELEASE_TABLET | Freq: Every day | ORAL | 12 refills | Status: AC
  Filled 2024-05-23: qty 30, 30d supply, fill #0

## 2024-05-23 NOTE — Plan of Care (Signed)
" °  Problem: Safety: Goal: Ability to remain free from injury will improve Outcome: Progressing   Problem: Activity: Goal: Capacity to carry out activities will improve Outcome: Progressing   Problem: Skin Integrity: Goal: Risk for impaired skin integrity will decrease Outcome: Progressing   "

## 2024-05-23 NOTE — Discharge Summary (Signed)
 " Physician Discharge Summary   Patient: Molly Howard MRN: [id number] DOB: 1985-09-30  Admit date:     05/14/2024  Discharge date: 05/23/24  Discharge Physician: Carliss LELON Canales   PCP: [name], DO   Recommendations at discharge:    Pt to be discharged home.   If you experience worsening fever, chills, chest pain, shortness of breath, or other concerning symptoms, please call your PCP or go to the emergency department immediately.  Discharge Diagnoses: Principal Problem:   Acute on chronic systolic CHF (congestive heart failure) (HCC) Active Problems:   Hypertensive urgency   Dyslipidemia   Coronary artery disease   Acute CHF (congestive heart failure) (HCC)  Resolved Problems:   * No resolved hospital problems. [facility] Course:  39 y.o. African-American female with medical history significant for GERD, chronic HFrEF, type diabetes mellitus, CKD stage 3a, nonischemic cardiomyopathy, polysubstance abuse and CVA, who presented to the emergency room with acute onset of worsening dyspnea over the last couple of days with associated orthopnea, paroxysmal nocturnal dyspnea and lower extremity edema.  She admitted to dry cough and wheezing.  She has been having dyspnea on exertion.  No fever or chills.  No nausea or vomiting or abdominal pain.  No dysuria, oliguria or hematuria or flank pain. She has reportedly been unable to    ED Course: BP was 174/116 with otherwise normal vital signs.  Labs revealed creatinine of 1.28 close to previous levels and calcium  6.8 with a BUN at 21.  proBNP was 8096.  High sensitive troponin T was 67 and later 64.  CBC showed hemoglobin 11.6 hematocrit 37.5. ZXH:dyntzi sinus rhythm at a rate of 74 with premature supraventricular complexes and possible left atrial enlargement and Q waves anteroseptally with T wave inversion inferolaterally. Imaging: 2 view chest x-ray showed pulm edema and increased interstitial markings as well as cardiomegaly with  AICD in place.  Right foot x-ray showed no acute findings.   The patient was given 120 mg of IV Lasix  and admitted for CHF exacerbation.    Assessment and Plan:   Acute exacerbation of chronic HFrEF - Secondary to medication noncompliance (could not afford meds).  Elevated proBNP chest x-ray noting pulmonary congestion on presentation.  Responded well to IV diuresis.  Restarted on home read.  Evaluated by cardiology.  Transition diuretic to p.o. torsemide  60 mg daily.  Refills provided for Coreg , Farxiga , Aldactone , Entresto , digoxin .  Recommend close follow-up with cardiology in the outpatient setting.  Hypertensive urgency/primary hypertension - Likely secondary to above.  Improved after p.o. medication regiment.  Elevated troponins - Likely demand ischemia given hypertensive urgency medication noncompliance.  Aspirin , statin on board.  Cardiology indicating no plans for ischemic workup.  CKD 3A - Creatinine stable.  Diabetes mellitus - A1c 9.2.  Poor compliance in the outpatient setting.  Initiated on Farxiga  as well as Tradjenta  5 mg daily.  Prescriptions provided.  Recommend close follow-up with PCP in the outpatient setting.  History of CVA - DAPT/Lipitor on board.  Consultants: Cardiology Procedures performed: None Disposition: Home Diet recommendation:  Cardiac and Carb modified diet  DISCHARGE MEDICATION: Allergies as of 05/23/2024       Reactions   Other Rash   Lemons         Medication List     STOP taking these medications    Furoscix  80 MG/10ML Ctkt Generic drug: Furosemide    Ozempic  (0.25 or 0.5 MG/DOSE) 2 MG/3ML Sopn Generic drug: Semaglutide (0.25 or 0.5MG /DOS)  TAKE these medications    Accu-Chek Softclix Lancets lancets Use as instructed   aspirin  EC 81 MG tablet Take 1 tablet (81 mg total) by mouth daily. Swallow whole.   atorvastatin  40 MG tablet Commonly known as: LIPITOR Take 1 tablet (40 mg total) by mouth daily.   carvedilol   12.5 MG tablet Commonly known as: COREG  Take 1 tablet (12.5 mg total) by mouth 2 (two) times daily with a meal.   clopidogrel  75 MG tablet Commonly known as: PLAVIX  Take 1 tablet (75 mg total) by mouth daily.   dapagliflozin  propanediol 10 MG Tabs tablet Commonly known as: FARXIGA  Take 1 tablet (10 mg total) by mouth once daily.   digoxin  0.125 MG tablet Commonly known as: LANOXIN  Take 0.5 tablets (0.0625 mg total) by mouth daily.   FeroSul 325 (65 Fe) MG tablet Generic drug: ferrous sulfate  Take 1 tablet (325 mg total) by mouth 2 (two) times daily.   FreeStyle Libre 3 Sensor Misc Place 1 sensor on the skin every 14 days. Use to check glucose continuously   sacubitril -valsartan  97-103 MG Commonly known as: Entresto  Take 1 tablet by mouth 2 (two) times daily.   spironolactone  25 MG tablet Commonly known as: ALDACTONE  Take 1 tablet (25 mg total) by mouth daily.   torsemide  20 MG tablet Commonly known as: DEMADEX  Take 3 tablets (60 mg total) by mouth daily.        Follow-up Information     [facility]. Go on 05/30/2024.   Specialty: Cardiology Why: Hospital Follow-Up 05/30/24 @ 11:30 AM Please bring all medications to follow-up appointment [facility], [address] Floor Free Valet Parking at the door Contact information: [address] [place]   [address] [phone]                Discharge Exam: [name]   05/21/24 0500 05/22/24 0500 05/23/24 0507  Weight: 112.2 kg 112.6 kg 105.1 kg    GENERAL:  Alert, pleasant, no acute distress  HEENT:  EOMI CARDIOVASCULAR:  RRR, no murmurs appreciated RESPIRATORY:  Clear to auscultation, no wheezing, rales, or rhonchi GASTROINTESTINAL:  Soft, nontender, nondistended EXTREMITIES:  No LE edema bilaterally NEURO:  No new focal deficits appreciated SKIN:  No rashes noted PSYCH:  Appropriate mood and affect      Condition at discharge: improving  The results of significant diagnostics from this hospitalization (including imaging, microbiology, ancillary and laboratory) are listed below for reference.   Imaging Studies: DG Foot Complete Right Result Date: 05/14/2024 EXAM: 3 OR MORE VIEW(S) XRAY OF THE RIGHT FOOT 05/14/2024 07:45:00 PM COMPARISON: None available. CLINICAL HISTORY: SOB FINDINGS: BONES AND JOINTS: No acute fracture. No malalignment. SOFT TISSUES: Vascular calcifications. IMPRESSION: 1. No acute findings. Electronically signed by: Oneil Devonshire MD MD 05/14/2024 08:35 PM EST RP Workstation: [id number]   DG Chest 2 View Result Date: 05/14/2024 EXAM: 2 VIEW(S) XRAY OF THE CHEST 05/14/2024 07:45:00 PM COMPARISON: 04/12/2024 CLINICAL HISTORY: SOB FINDINGS: LUNGS AND PLEURA: Left midlung zone linear scarring. Pulmonary edema and interstitial markings. No pleural effusion. No pneumothorax. HEART AND MEDIASTINUM: Cardiomegaly. Cardiac defibrillator in place. BONES AND SOFT TISSUES: No acute osseous abnormality. IMPRESSION: 1. Pulmonary edema and increased interstitial markings. 2. Cardiomegaly with cardiac defibrillator in place. Electronically signed by: Oneil Devonshire MD MD 05/14/2024 08:34 PM EST RP Workstation: [id number]    Microbiology: Results for orders placed or performed during the hospital encounter of 02/20/24  MRSA Next Gen by PCR, Nasal  Status: None   Collection Time: 02/20/24 11:53 PM   Specimen: Nasal Mucosa; Nasal Swab  Result Value Ref Range Status   MRSA by PCR Next Gen NOT DETECTED NOT DETECTED Final    Comment: (NOTE) The GeneXpert MRSA Assay (FDA approved for NASAL specimens only), is one component of a comprehensive MRSA colonization surveillance program. It is not intended to diagnose MRSA infection nor to guide or monitor treatment for MRSA infections. Test performance is not FDA approved in patients less than 16 years old. Performed at [facility], [address] [place], [place] [address]     Labs: CBC: Recent Labs  Lab 05/17/24 [id number] 05/18/24 0601 05/22/24 0317 05/23/24 0449  WBC 5.2 4.7 4.9 4.8  HGB 11.5* 11.3* 11.2* 12.5  HCT 37.5 36.8 36.0 40.5  MCV 95.2 94.4 93.0 92.9  PLT 289 305 332 354   Basic Metabolic Panel: Recent Labs  Lab 05/17/24 0511 05/18/24 0601 05/19/24 0812 05/20/24 0350 05/21/24 0513 05/22/24 0812 05/23/24 0449  NA 136   < > 137 136 136 136 136  K 4.4   < > 4.7 4.9 4.8 4.9 4.9  CL 98   < > 98 95* 98 97* 95*  CO2 30   < > 31 32 29 31 33*  GLUCOSE 187*   < > 148* 202* 212* 156* 198*  BUN 20   < > 23* 29* 31* 33* 37*  CREATININE 1.41*   < > 1.41* 1.53* 1.45* 1.56* 1.77*  CALCIUM  6.6*   < > 7.5* 7.8* 7.8* 8.3* 8.8*  MG 1.9  --   --   --   --   --   --    < > = values in this interval not displayed.   Liver Function Tests: No results for input(s): AST, ALT, ALKPHOS, BILITOT, PROT, ALBUMIN  in the last 168 hours. CBG: Recent Labs  Lab 05/22/24 0816 05/22/24 1143 05/22/24 1648 05/22/24 2103 05/23/24 0739  GLUCAP 158* 307* 175* 245* 220*    Discharge time spent: 33 minutes.  Length of inpatient stay: 8 days  Signed: Carliss LELON Canales, DO [facility] 05/23/2024         "

## 2024-05-23 NOTE — Progress Notes (Signed)
 Reviewed discharge instructions with pt. Pt verbalizes understanding. IV removed. Tele removed. PT has belongings. PT transported to discharge lounge where she will wait for her ride and medications

## 2024-05-23 NOTE — Plan of Care (Signed)
   Problem: Education: Goal: Knowledge of General Education information will improve Description: Including pain rating scale, medication(s)/side effects and non-pharmacologic comfort measures Outcome: Progressing   Problem: Health Behavior/Discharge Planning: Goal: Ability to manage health-related needs will improve Outcome: Progressing   Problem: Clinical Measurements: Goal: Will remain free from infection Outcome: Progressing

## 2024-05-23 NOTE — Progress Notes (Signed)
 "   Heart Failure Stewardship Pharmacy Note  PCP: [name], DO PCP-Cardiologist: Redell Cave, MD  HPI: Molly Howard is a 39 y.o. female with a history of DM, HTN, previous drug use, NSVT, PSVT, mod/severe TR, CKD, CVS 10/25, anemia and chronic biventricular heart failure who presented with SOB and right foot pain.   On admission, proBNP was 8096, HS-troponin was 64, Scr 1.28, K 3.6, Ca 6.8. Chest XR on admission noted pulmonary edema and increased interstitial markings.   Pertinent cardiac history:  Echo 10/11/20: EF 30-35% along with moderate TR.  Echo 03/20/21: EF 25-30% along with severely elevated PA pressure of 70.8 mmHg, mild LAE, mild MR and mild/moderate TR Cardiac MRI 05/19/21:  Mild LV dilatation, mild hypertrophy, and mild systolic dysfunction (EF 42%), no LGE, normal RV size and systolic function (EF 50%). Echo 07/25/22: EF 25-30% with moderately elevated PA pressure of 50.7 mmHg, mild LAE, mild MR, small pericardial effusion and moderate/ severe TR  Echo with bubble study 02/21/24: EF 25-30%, moderate LVH, G3DD, abnormal GLS of -4.2%, RV moderately reduced, mod/severe TR, no interatrial shunt  Pertinent Lab Values: Creat  Date Value Ref Range Status  01/16/2024 1.30 (H) 0.50 - 0.97 mg/dL Final   Creatinine, Ser  Date Value Ref Range Status  05/22/2024 1.56 (H) 0.44 - 1.00 mg/dL Final   BUN  Date Value Ref Range Status  05/22/2024 33 (H) 6 - 20 mg/dL Final  97/89/7974 15 6 - 20 mg/dL Final   Potassium  Date Value Ref Range Status  05/22/2024 4.9 3.5 - 5.1 mmol/L Final   Sodium  Date Value Ref Range Status  05/22/2024 136 135 - 145 mmol/L Final  06/13/2023 142 134 - 144 mmol/L Final   B Natriuretic Peptide  Date Value Ref Range Status  01/04/2024 2,362.6 (H) 0.0 - 100.0 pg/mL Final    Comment:    Performed at [facility], [address] [place], [place] [address]   Magnesium   Date Value Ref Range Status  05/17/2024 1.9 1.7 - 2.4 mg/dL  Final    Comment:    Performed at [facility], [address] [place], [place] [address]   Hgb A1c MFr Bld  Date Value Ref Range Status  05/20/2024 9.2 (H) 4.8 - 5.6 % Final    Comment:    (NOTE) Diagnosis of Diabetes The following HbA1c ranges recommended by the American Diabetes Association (ADA) may be used as an aid in the diagnosis of diabetes mellitus.  Hemoglobin             Suggested A1C NGSP%              Diagnosis  <5.7                   Non Diabetic  5.7-6.4                Pre-Diabetic  >6.4                   Diabetic  <7.0                   Glycemic control for                       adults with diabetes.     Digoxin  Level  Date Value Ref Range Status  01/11/2024 0.9 0.8 - 2.0 ng/mL Final    Comment:    Performed at [facility], [address] [place], [place] [address]   TSH  Date Value Ref Range Status  11/09/2021 5.114 (H) 0.350 - 4.500 uIU/mL Final    Comment:    Performed by a 3rd Generation assay with a functional sensitivity of <=0.01 uIU/mL. Performed at [facility], [address] [place], [place] [address]    LDH  Date Value Ref Range Status  04/15/2023 216 (H) 98 - 192 U/L Final    Comment:    Performed at [facility], [address] [place], [place] [address]    Vital Signs:  Temp:  [97.8 F (36.6 C)-98.4 F (36.9 C)] 97.8 F (36.6 C) (01/21 0423) Pulse Rate:  [62-68] 65 (01/21 0423) Cardiac Rhythm: Normal sinus rhythm (01/20 1930) Resp:  [16-18] 18 (01/21 0423) BP: (112-147)/(75-92) 117/77 (01/21 0423) SpO2:  [89 %-100 %] 89 % (01/21 0423) Weight:  [105.1 kg (231 lb 9.6 oz)] 105.1 kg (231 lb 9.6 oz) (01/21 0507)  Intake/Output Summary (Last 24 hours) at 05/23/2024 0601 Last data filed at 05/23/2024 0427 Gross per 24 hour  Intake 600 ml  Output 1350 ml  Net -750 ml    Current Heart Failure Medications:  Loop diuretic: furosemide  40 mg IV q12h  Beta-Blocker: carvedilol  12.5 mg  BID ACEI/ARB/ARNI: Entresto  97/103 mg BID  MRA: spironolactone  25 mg daily  SGLT2i: Farxiga  10 mg daily  Other: atorvastatin  40 mg daily, digoxin  0.0625 mg daily   Prior to admission Heart Failure Medications:  Loop diuretic: torsemide  60 mg daily  Beta-Blocker: carvedilol  12.5 mg BID  ACEI/ARB/ARNI: Entresto  97/103 mg BID  MRA: spironolactone  25 mg daily  SGLT2i: Farxiga  10 mg daily  Other: atorvastatin  40 mg daily, digoxin  0.0625 mg daily   Assessment: 1.   Acute on chronic systolic heart failure (LVEF 25-30%), due to previous cocaine use and uncontrolled HTN. NYHA class III symptoms.   -Symptoms: Reports SOB has improved to baseline. Appetite is good. Reports right knee pain. -Volume: Appears euvolemic. Reports feeling dry and back to baseline. Down 10 kg since admit. Consider holding furosemide  today, renal function slightly worsened. Consider transitioning to torsemide  PO 60 mg tomorrow. -Hemodynamics: labile BP, HR 60s-80s. -BB: Consider optimizing dose to carvedilol  25 mg BID for mortality benefit. Euvolemic, BP and HR can likely tolerate. -ACEI/ARB/ARNI: Continue Entresto  97/103 mg BID. BP tolerating. -MRA: Continue spironolactone  25 mg daily. K 4.9.  -SGLT2i: Continue Farxiga  10 mg daily. A1c 9.2%.  Plan: 1) Medication changes recommended at this time: - Consider optimizing dose to carvedilol  25 mg BID.  - Consider holding furosemide  today. Consider transitioning to torsemide  PO 60 mg tomorrow.  2) Patient assistance: - Copay for Entresto , Farxiga , Jardiance are $4 each. Patient can use charge account if unable to provide copay at time of discharge.  3) Education: - Patient has been educated on current HF medications and potential additions to HF medication regimen - Patient verbalizes understanding that over the next few months, these medication doses may change and more medications may be added to optimize HF regimen - Patient has been educated on basic disease state  pathophysiology and goals of therapy   Calton Nash, [email or web address] PharmD Candidate   Please do not hesitate to reach out with questions or concerns,  Molly Howard, PharmD, CPP, BCPS, [facility] Heart Failure Pharmacist  Phone - [phone] 05/23/2024 6:01 AM  "

## 2024-05-23 NOTE — Plan of Care (Signed)
" °  Problem: Education: Goal: Knowledge of General Education information will improve Description: Including pain rating scale, medication(s)/side effects and non-pharmacologic comfort measures 05/23/2024 1042 by Evelin Cake K, RN Outcome: Adequate for Discharge 05/23/2024 1041 by Freeda Leon POUR, RN Outcome: Progressing   Problem: Health Behavior/Discharge Planning: Goal: Ability to manage health-related needs will improve 05/23/2024 1042 by Freeda Leon POUR, RN Outcome: Adequate for Discharge 05/23/2024 1041 by Freeda Leon POUR, RN Outcome: Progressing   Problem: Clinical Measurements: Goal: Ability to maintain clinical measurements within normal limits will improve Outcome: Adequate for Discharge Goal: Will remain free from infection 05/23/2024 1042 by Freeda Leon POUR, RN Outcome: Adequate for Discharge 05/23/2024 1041 by Freeda Leon POUR, RN Outcome: Progressing Goal: Diagnostic test results will improve Outcome: Adequate for Discharge Goal: Respiratory complications will improve Outcome: Adequate for Discharge Goal: Cardiovascular complication will be avoided Outcome: Adequate for Discharge   Problem: Activity: Goal: Risk for activity intolerance will decrease Outcome: Adequate for Discharge   Problem: Nutrition: Goal: Adequate nutrition will be maintained Outcome: Adequate for Discharge   Problem: Coping: Goal: Level of anxiety will decrease Outcome: Adequate for Discharge   Problem: Elimination: Goal: Will not experience complications related to bowel motility Outcome: Adequate for Discharge Goal: Will not experience complications related to urinary retention Outcome: Adequate for Discharge   Problem: Pain Managment: Goal: General experience of comfort will improve and/or be controlled Outcome: Adequate for Discharge   Problem: Safety: Goal: Ability to remain free from injury will improve Outcome: Adequate for Discharge   Problem: Skin Integrity: Goal:  Risk for impaired skin integrity will decrease Outcome: Adequate for Discharge   Problem: Education: Goal: Ability to demonstrate management of disease process will improve Outcome: Adequate for Discharge Goal: Ability to verbalize understanding of medication therapies will improve Outcome: Adequate for Discharge Goal: Individualized Educational Video(s) Outcome: Adequate for Discharge   Problem: Activity: Goal: Capacity to carry out activities will improve Outcome: Adequate for Discharge   Problem: Cardiac: Goal: Ability to achieve and maintain adequate cardiopulmonary perfusion will improve Outcome: Adequate for Discharge   Problem: Education: Goal: Ability to describe self-care measures that may prevent or decrease complications (Diabetes Survival Skills Education) will improve Outcome: Adequate for Discharge Goal: Individualized Educational Video(s) Outcome: Adequate for Discharge   Problem: Coping: Goal: Ability to adjust to condition or change in health will improve Outcome: Adequate for Discharge   Problem: Fluid Volume: Goal: Ability to maintain a balanced intake and output will improve Outcome: Adequate for Discharge   Problem: Health Behavior/Discharge Planning: Goal: Ability to identify and utilize available resources and services will improve Outcome: Adequate for Discharge Goal: Ability to manage health-related needs will improve Outcome: Adequate for Discharge   Problem: Metabolic: Goal: Ability to maintain appropriate glucose levels will improve Outcome: Adequate for Discharge   Problem: Nutritional: Goal: Maintenance of adequate nutrition will improve Outcome: Adequate for Discharge Goal: Progress toward achieving an optimal weight will improve Outcome: Adequate for Discharge   Problem: Skin Integrity: Goal: Risk for impaired skin integrity will decrease Outcome: Adequate for Discharge   Problem: Tissue Perfusion: Goal: Adequacy of tissue  perfusion will improve Outcome: Adequate for Discharge   "

## 2024-05-23 NOTE — Progress Notes (Signed)
 "  Rounding Note   Patient Name: Molly Howard Date of Encounter: 05/23/2024  [facility] Cardiologist: Redell Cave, MD   Subjective Weight down about 30 lbs since admission. Renal function trending up. Appears relatively euvolemic on exam. Able to ambulate the halls without dyspnea.   Scheduled Meds:  aspirin  EC  81 mg Oral Daily   atorvastatin   40 mg Oral Daily   carvedilol   12.5 mg Oral BID WC   clopidogrel   75 mg Oral Daily   dapagliflozin  propanediol  10 mg Oral Daily   digoxin   0.0625 mg Oral Daily   [START ON 05/24/2024] enoxaparin  (LOVENOX ) injection  50 mg Subcutaneous Q24H   ferrous sulfate   325 mg Oral BID   insulin  aspart  0-15 Units Subcutaneous TID WC   sacubitril -valsartan   1 tablet Oral BID   spironolactone   25 mg Oral Daily   [START ON 05/24/2024] torsemide   60 mg Oral Daily   Continuous Infusions:  PRN Meds: acetaminophen  **OR** acetaminophen , guaiFENesin -dextromethorphan , magnesium  hydroxide, ondansetron  **OR** ondansetron  (ZOFRAN ) IV, oxyCODONE , traZODone    Vital Signs  Vitals:   05/23/24 0423 05/23/24 0507 05/23/24 0837 05/23/24 0955  BP: 117/77  133/82   Pulse: 65  63 72  Resp: 18  18   Temp: 97.8 F (36.6 C)  98 F (36.7 C)   TempSrc: Oral  Oral   SpO2: (!) 89%  98%   Weight:  105.1 kg    Height:        Intake/Output Summary (Last 24 hours) at 05/23/2024 1229 Last data filed at 05/23/2024 0427 Gross per 24 hour  Intake 480 ml  Output 1350 ml  Net -870 ml      05/23/2024    5:07 AM 05/22/2024    5:00 AM 05/21/2024    5:00 AM  Last 3 Weights  Weight (lbs) 231 lb 9.6 oz 248 lb 3.8 oz 247 lb 5.7 oz  Weight (kg) 105.053 kg 112.6 kg 112.2 kg      Telemetry Sinus rhythm with PVCs and NSVT up to 6 beats - Personally Reviewed  Physical Exam  GEN: No acute distress.   Neck: No JVP Cardiac: RRR, no murmurs, rubs, or gallops.  Respiratory: Clear to auscultation bilaterally. GI: Soft, nontender, non-distended  MS: Trace  bilateral LE edema; No deformity. Neuro:  Nonfocal  Psych: Normal affect   Labs High Sensitivity Troponin:  No results for input(s): TROPONINIHS in the last 720 hours.  Recent Labs  Lab 05/14/24 1917 05/14/24 2341  TRNPT 67* 64*       Chemistry Recent Labs  Lab 05/17/24 0511 05/18/24 0601 05/21/24 0513 05/22/24 0812 05/23/24 0449  NA 136   < > 136 136 136  K 4.4   < > 4.8 4.9 4.9  CL 98   < > 98 97* 95*  CO2 30   < > 29 31 33*  GLUCOSE 187*   < > 212* 156* 198*  BUN 20   < > 31* 33* 37*  CREATININE 1.41*   < > 1.45* 1.56* 1.77*  CALCIUM  6.6*   < > 7.8* 8.3* 8.8*  MG 1.9  --   --   --   --   GFRNONAA 48*   < > 47* 43* 37*  ANIONGAP 8   < > 10 8 8    < > = values in this interval not displayed.    Lipids No results for input(s): CHOL, TRIG, HDL, LABVLDL, LDLCALC, CHOLHDL in the last 168 hours.  Hematology Recent Labs  Lab 05/18/24 0601 05/22/24 0317 05/23/24 0449  WBC 4.7 4.9 4.8  RBC 3.90 3.87 4.36  HGB 11.3* 11.2* 12.5  HCT 36.8 36.0 40.5  MCV 94.4 93.0 92.9  MCH 29.0 28.9 28.7  MCHC 30.7 31.1 30.9  RDW 15.1 14.7 14.7  PLT 305 332 354   Thyroid  No results for input(s): TSH, FREET4 in the last 168 hours.  BNP No results for input(s): BNP, PROBNP in the last 168 hours.   DDimer No results for input(s): DDIMER in the last 168 hours.   Radiology  No results found.  Cardiac Studies  08/2023 2D echo 1. Left ventricular ejection fraction, by estimation, is 30 to 35%. The  left ventricle has moderate to severely decreased function. The left  ventricle demonstrates global hypokinesis. There is mild left ventricular  hypertrophy. Left ventricular  diastolic parameters are indeterminate.   2. Right ventricular systolic function is low normal. The right  ventricular size is normal.   3. The mitral valve is normal in structure. Mild mitral valve  regurgitation.   4. Tricuspid valve regurgitation is mild to moderate.   5. The aortic  valve is tricuspid. Aortic valve regurgitation is not  visualized.   6. The inferior vena cava is normal in size with <50% respiratory  variability, suggesting right atrial pressure of 8 mmHg.   Patient Profile   39 y.o. female with a history of chronic HFrEF, nonischemic cardiomyopathy, hypertension, stage II chronic kidney disease, diabetes, GERD, and polysubstance abuse, who was admitted January 12 with recurrent heart failure in the setting of noncompliance.   Assessment & Plan   Acute on chronic HFrEF Nonischemic cardiomyopathy - Admitted 1/12 with progressive volume overload and dyspnea due to noncompliance in the outpatient setting. Patient reports difficulty affording medications and copayments for OP follow up. Meds cost $12-15 per month but she does not have an income currently.  - Received metolazone  1/17 in addition to IV Lasix  with renal function slightly worsened yesterday. Evening dose of Lasix  held.  - Volume status appears improved today with renal function trending up - Recommend transitioning to torsemide  60 mg daily tomorrow - Continue carvedilol  12.5 mg twice daily, Farxiga  10 mg daily, digoxin  0.0625 daily, Entresto  97-103 mg twice daily, and spironolactone  25 mg daily  Demand ischemia - Most recent ischemic evaluation in 2021 with nonischemic Myoview  - Troponin minimally elevated to 67 in the setting of above - No chest pain - No plans for ischemic evaluation at this time - Continue aspirin , statin, beta-blocker, and antiplatelet therapy  Primary hypertension - Stable on current regimen  Hyperlipidemia - Continue statin therapy  T2DM - A1c of 8.4 in September - Ongoing management per IM  Hx substance abuse - UDS negative this admission  Hx stroke - On DAPT and statin   For questions or updates, please contact [facility] Please consult [email or web address] for contact info under       Signed, Lesley LITTIE Maffucci, PA-C  05/23/2024, 12:29 PM    "

## 2024-05-23 NOTE — TOC Transition Note (Signed)
 Transition of Care [facility]) - Discharge Note   Patient Details  Name: Molly Howard MRN: [id number] Date of Birth: May 23, 1985  Transition of Care [facility]) CM/SW Contact:  Lauraine JAYSON Carpen, LCSW Phone Number: 05/23/2024, 11:08 AM   Clinical Narrative: Patient has orders to discharge home today. She confirmed the address on the facesheet is correct. CSW called [employer] [employer] and scheduled transport for around 3:15 so that Tradjenta  can be delivered once auth approved. No further concerns. CSW signing off.    Final next level of care: Home/Self Care Barriers to Discharge: Barriers Resolved   Patient Goals and CMS Choice            Discharge Placement                Patient to be transferred to facility by: Medicaid Transportation   Patient and family notified of of transfer: 05/23/24  Discharge Plan and Services Additional resources added to the After Visit Summary for                                       Social Drivers of Health (SDOH) Interventions SDOH Screenings   Food Insecurity: No Food Insecurity (05/18/2024)  Housing: High Risk (05/18/2024)  Transportation Needs: Unmet Transportation Needs (05/18/2024)  Utilities: Not At Risk (05/18/2024)  Alcohol Screen: Low Risk (04/15/2023)  Depression (PHQ2-9): Low Risk (01/16/2024)  Financial Resource Strain: High Risk (08/25/2023)  Social Connections: Unknown (08/28/2023)  Tobacco Use: Medium Risk (05/14/2024)     Readmission Risk Interventions    05/21/2024    3:44 PM 02/22/2024    3:27 PM 01/06/2024   10:15 AM  Readmission Risk Prevention Plan  Transportation Screening Complete Complete Complete  Medication Review (RN Care Manager) Complete Complete Referral to Pharmacy  PCP or Specialist appointment within 3-5 days of discharge -- Complete Complete  HRI or Home Care Consult Complete  Complete  SW Recovery Care/Counseling Consult  Complete Complete  Palliative Care Screening Not Applicable Not Applicable Not  Applicable  Skilled Nursing Facility Not Applicable Not Applicable Not Applicable

## 2024-05-23 NOTE — Inpatient Diabetes Management (Signed)
 Inpatient Diabetes Program Recommendations  AACE/ADA: New Consensus Statement on Inpatient Glycemic Control   Target Ranges:  Prepandial:   less than 140 mg/dL      Peak postprandial:   less than 180 mg/dL (1-2 hours)      Critically ill patients:  140 - 180 mg/dL    Latest Reference Range & Units 05/22/24 08:16 05/22/24 11:43 05/22/24 16:48 05/22/24 21:03 05/23/24 07:39  Glucose-Capillary 70 - 99 mg/dL 841 (H) 692 (H) 824 (H) 245 (H) 220 (H)   Review of Glycemic Control  Diabetes history: DM2 Outpatient Diabetes medications: Farxiga  10 mg daily, Ozempic  0.25 mg Qweek (not taking) Current orders for Inpatient glycemic control: Novolog  0-15 units TID, Farxiga  10 mg daily   Inpatient Diabetes Program Recommendations:     Oral DM: CBG 220 mg/dl today and J8R 0.7% (only taking Farxiga  outpatient).  Please consider ordering Tradjenta  5 mg daily.   If Tradjenta  is ordered, it requires Prior Authorization if prescribed outpatient.  HbgA1C:  A1C 9.2% on 05/20/24 indicating an average glucose of 217 mg/dl over the past 2-3 months.   Outpatient DM: May want to consider adding additional oral DM medication outpatient to get DM under better control. Patient reports she does not tolerate Metformin . Due to hx of living in car and food insecurity, would not recommend to use sulfonylureas due to increased risk of hypoglycemia.   Thanks, Earnie Gainer, RN, MSN, CDCES Diabetes Coordinator Inpatient Diabetes Program [phone] (Team Pager from 8am to 5pm)

## 2024-05-24 ENCOUNTER — Other Ambulatory Visit: Payer: Self-pay

## 2024-05-24 NOTE — Telephone Encounter (Signed)
 Pharmacy Patient Advocate Encounter  Received notification from [facility] that Prior Authorization for Tradjenta  5MG  tablets has been APPROVED from 05/23/24 to 05/22/25   PA #/Case ID/Reference #: [id number]

## 2024-05-29 ENCOUNTER — Telehealth (HOSPITAL_COMMUNITY): Payer: Self-pay | Admitting: Licensed Clinical Social Worker

## 2024-05-29 NOTE — Telephone Encounter (Signed)
 H&V Care Navigation CSW Progress Note  Clinical Social Worker consulted to assist pt with transportation.  CSW called [facility] to arrange transport- they cannot confirm they can bring until tomorrow due to weather but CSW sent in voucher and will call in the morning.  Patient inquired about transport to PCP visit Friday- explained I could not provide ride to this as is not within [facility]- provided her with number to her insurance transportation line.  SDOH Screenings   Food Insecurity: No Food Insecurity (05/18/2024)  Housing: High Risk (05/18/2024)  Transportation Needs: Unmet Transportation Needs (05/29/2024)  Utilities: Not At Risk (05/18/2024)  Alcohol Screen: Low Risk (04/15/2023)  Depression (PHQ2-9): Low Risk (01/16/2024)  Financial Resource Strain: High Risk (08/25/2023)  Social Connections: Unknown (08/28/2023)  Tobacco Use: Medium Risk (05/14/2024)    Andriette HILARIO Leech, LCSW Clinical Social Worker [facility] Desk#: [phone] Cell#: [phone]
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
## 2024-05-30 ENCOUNTER — Ambulatory Visit: Payer: MEDICAID | Admitting: Family

## 2024-05-30 ENCOUNTER — Other Ambulatory Visit
Admission: RE | Admit: 2024-05-30 | Discharge: 2024-05-30 | Disposition: A | Discharge status: Home / Self Care | Payer: MEDICAID | Source: Ambulatory Visit | Attending: Family | Admitting: Family

## 2024-05-30 ENCOUNTER — Encounter: Payer: Self-pay | Admitting: Family

## 2024-05-30 ENCOUNTER — Ambulatory Visit: Payer: Self-pay | Admitting: Family

## 2024-05-30 VITALS — BP 117/82 | HR 80 | Wt 228.8 lb

## 2024-05-30 DIAGNOSIS — I10 Essential (primary) hypertension: Secondary | ICD-10-CM

## 2024-05-30 DIAGNOSIS — Z5900 Homelessness unspecified: Secondary | ICD-10-CM | POA: Diagnosis not present

## 2024-05-30 DIAGNOSIS — D509 Iron deficiency anemia, unspecified: Secondary | ICD-10-CM

## 2024-05-30 DIAGNOSIS — N1831 Chronic kidney disease, stage 3a: Secondary | ICD-10-CM

## 2024-05-30 DIAGNOSIS — F1910 Other psychoactive substance abuse, uncomplicated: Secondary | ICD-10-CM | POA: Diagnosis not present

## 2024-05-30 DIAGNOSIS — I5020 Unspecified systolic (congestive) heart failure: Secondary | ICD-10-CM | POA: Diagnosis not present

## 2024-05-30 DIAGNOSIS — E1122 Type 2 diabetes mellitus with diabetic chronic kidney disease: Secondary | ICD-10-CM | POA: Diagnosis not present

## 2024-05-30 DIAGNOSIS — Z79899 Other long term (current) drug therapy: Secondary | ICD-10-CM | POA: Diagnosis not present

## 2024-05-30 DIAGNOSIS — I63319 Cerebral infarction due to thrombosis of unspecified middle cerebral artery: Secondary | ICD-10-CM

## 2024-05-30 LAB — BASIC METABOLIC PANEL WITH GFR
Anion gap: 12 (ref 5–15)
BUN: 37 mg/dL — ABNORMAL HIGH (ref 6–20)
CO2: 24 mmol/L (ref 22–32)
Calcium: 8.7 mg/dL — ABNORMAL LOW (ref 8.9–10.3)
Chloride: 101 mmol/L (ref 98–111)
Creatinine, Ser: 1.72 mg/dL — ABNORMAL HIGH (ref 0.44–1.00)
GFR, Estimated: 38 mL/min — ABNORMAL LOW
Glucose, Bld: 185 mg/dL — ABNORMAL HIGH (ref 70–99)
Potassium: 4.2 mmol/L (ref 3.5–5.1)
Sodium: 137 mmol/L (ref 135–145)

## 2024-05-30 LAB — DIGOXIN LEVEL: Digoxin Level: 0.8 ng/mL (ref 0.8–2.0)

## 2024-05-30 NOTE — Patient Instructions (Signed)
 Medication Changes:  No medication changes today!  Lab Work:  Go over to the [facility]. Go pass the gift shop and have your blood work completed.  We will only call you if the results are abnormal or if the provider would like to make medication changes.  No news is good news.    Follow-Up in: Please follow up with the Advanced Heart Failure Clinic in 1 month with [name], FNP.   Thank you for choosing [facility].    At the Advanced Heart Failure Clinic, you and your health needs are our priority. We have a designated team specialized in the treatment of Heart Failure. This Care Team includes your primary Heart Failure Specialized Cardiologist (physician), Advanced Practice Providers (APPs- Physician Assistants and Nurse Practitioners), and Pharmacist who all work together to provide you with the care you need, when you need it.   You may see any of the following providers on your designated Care Team at your next follow up:  Dr. Toribio Fuel Dr. Ezra Shuck Dr. Ria Commander Dr. Morene Brownie [name], FNP Jaun Bash, RPH-CPP  Please be sure to bring in all your medications bottles to every appointment.   Need to Contact Us :  If you have any questions or concerns before your next appointment please send us  a message through [place] or call our office at [phone].    TO LEAVE A MESSAGE FOR THE NURSE SELECT OPTION 2, PLEASE LEAVE A MESSAGE INCLUDING: YOUR NAME DATE OF BIRTH CALL BACK NUMBER REASON FOR CALL**this is important as we prioritize the call backs  YOU WILL RECEIVE A CALL BACK THE SAME DAY AS LONG AS YOU CALL BEFORE 4:00 PM

## 2024-06-01 ENCOUNTER — Other Ambulatory Visit: Payer: Self-pay

## 2024-06-01 ENCOUNTER — Ambulatory Visit: Payer: MEDICAID | Admitting: Internal Medicine

## 2024-06-01 ENCOUNTER — Encounter: Payer: Self-pay | Admitting: Internal Medicine

## 2024-06-01 VITALS — BP 120/80 | HR 80 | Temp 98.3°F | Resp 16 | Ht 67.0 in | Wt 230.0 lb

## 2024-06-01 DIAGNOSIS — Z7984 Long term (current) use of oral hypoglycemic drugs: Secondary | ICD-10-CM

## 2024-06-01 DIAGNOSIS — Z23 Encounter for immunization: Secondary | ICD-10-CM | POA: Diagnosis not present

## 2024-06-01 DIAGNOSIS — E1122 Type 2 diabetes mellitus with diabetic chronic kidney disease: Secondary | ICD-10-CM

## 2024-06-01 DIAGNOSIS — M25561 Pain in right knee: Secondary | ICD-10-CM

## 2024-06-01 DIAGNOSIS — I5020 Unspecified systolic (congestive) heart failure: Secondary | ICD-10-CM | POA: Diagnosis not present

## 2024-06-01 DIAGNOSIS — E785 Hyperlipidemia, unspecified: Secondary | ICD-10-CM | POA: Diagnosis not present

## 2024-06-01 DIAGNOSIS — G8929 Other chronic pain: Secondary | ICD-10-CM | POA: Diagnosis not present

## 2024-06-01 DIAGNOSIS — N1832 Chronic kidney disease, stage 3b: Secondary | ICD-10-CM | POA: Diagnosis not present

## 2024-06-01 DIAGNOSIS — I10 Essential (primary) hypertension: Secondary | ICD-10-CM

## 2024-06-01 DIAGNOSIS — Z1231 Encounter for screening mammogram for malignant neoplasm of breast: Secondary | ICD-10-CM

## 2024-06-01 MED ORDER — SEMAGLUTIDE(0.25 OR 0.5MG/DOS) 2 MG/3ML ~~LOC~~ SOPN
0.2500 mg | PEN_INJECTOR | SUBCUTANEOUS | 2 refills | Status: AC
  Filled 2024-06-01: qty 3, 56d supply, fill #0

## 2024-06-01 MED ORDER — FREESTYLE LIBRE 3 SENSOR MISC
12 refills | Status: DC
  Filled 2024-06-01: qty 2, 28d supply, fill #0

## 2024-06-01 NOTE — Progress Notes (Signed)
 "  Established Patient Office Visit  Subjective:  Patient ID: Molly Howard, female    DOB: May 01, 1986  Age: 39 y.o. MRN: [id number]  CC:  Chief Complaint  Patient presents with   Hospitalization Follow-up    HPI Molly Howard presents for follow up on chronic medical conditions.   Discussed the use of AI scribe software for clinical note transcription with the patient, who gave verbal consent to proceed.  History of Present Illness  Molly Howard is a 39 year old female with diabetes and heart failure who presents for follow-up on her diabetes management and knee pain.  She takes Farxiga  10 mg for heart failure and diabetes and was recently started on Tradjenta  in the hospital. She previously used Ozempic  weekly for three months with good tolerance and A1c of 6.7%. After stopping Ozempic  her A1c increased to 9.2%. She prefers to return to a once-weekly injection to improve diabetes control.  She has severe right lateral knee pain that worsens with walking, with swelling and episodes of the knee giving out on longer distances. An x-ray in September 2024 reportedly showed fluid in the knee. A prior steroid injection gave only temporary relief.  She has numbness and tingling in her feet, worse in the more swollen foot, and intermittent leg swelling that she relates to her heart condition. She had no gastrointestinal issues while previously on Ozempic .   Hypertension/HFrEF/NICM: -Medications: Torsemide  60 mg, Carvedilol  12.5 mg BID, Entresto , Farxiga  10 mg, Digoxin  0.0625 mg, Spironolactone  25 mg -Has been compliant with her medications for the last month and doing well overall -Follows with Cardiology -Last echo 07/25/22 showing reduced EF at 25-30% with global hypokinesis of the left ventricle   Diabetes Type 2: -Last A1c 9.2% 1/26 -Medications: Fargixa 10 mg, Tradjenta  5 mg ? And no longer Ozempic  or insulin   -Patient states she is general compliant with the above medications and  reports no side effects.  -Checking BG at home: not checking -Eye exam: Due -Foot exam: Due -Microalbumin: Due -Statin: yes -PNA vaccine: UTD -Denies symptoms of hypoglycemia, polyuria, polydipsia, numbness extremities, foot ulcers/trauma.   HLD: -Medications: Lipitor 40 mg -Patient is compliant with above medications and reports no side effects.  -Last lipid panel: Lipid Panel     Component Value Date/Time   CHOL 210 (H) 02/21/2024 0438   TRIG 107 02/21/2024 0438   HDL 59 02/21/2024 0438   CHOLHDL 3.6 02/21/2024 0438   VLDL 21 02/21/2024 0438   LDLCALC 130 (H) 02/21/2024 0438   LDLCALC 171 (H) 06/12/2021 1354    Past Medical History:  Diagnosis Date   Acid reflux    Chronic HFrEF (heart failure with reduced ejection fraction) (HCC)    a. 10/2019 Echo: EF 35-40%, GrII DD; b. 05/2020 Echo: EF 20-25%, glob HK; c. 10/2020 Echo: EF 30-35%, glob HK. GrII DD, Mildly red RV fxn. Mod TR; d.  05/2021 cMRI: EF 42%, no LGE. Nl RV size/fxn.   CKD (chronic kidney disease), stage II    Diabetes mellitus (HCC)    H/O medication noncompliance    Hypertension    Microcytic anemia    NICM (nonischemic cardiomyopathy) (HCC)    a. 10/2019 Echo: EF 35-40%; b. 10/2019 MV: No ischemia. Small apical defect-->breast attenuation; c. 05/2020 Echo: EF 20-25%; d. 10/2020 Echo: EF 30-35%, glob HK. GrII DD, Mildly red RV fxn. Mod TR; e. 05/2021 cMRI: EF 42%, no LGE. Nl RV size/fxn.   Obesity    Polysubstance abuse (HCC)  Stroke [facility])     Past Surgical History:  Procedure Laterality Date   CHOLECYSTECTOMY     HERNIA REPAIR     ICD IMPLANT     RIGHT HEART CATH N/A 07/28/2022   Procedure: RIGHT HEART CATH;  Surgeon: Darron Deatrice LABOR, MD;  Location: [facility] INVASIVE CV LAB;  Service: Cardiovascular;  Laterality: N/A;   SUBQ ICD IMPLANT N/A 06/27/2023   Procedure: SUBQ ICD IMPLANT;  Surgeon: Cindie Ole DASEN, MD;  Location: [facility] INVASIVE CV LAB;  Service: Cardiovascular;  Laterality: N/A;    Family History   Problem Relation Age of Onset   Heart failure Mother        Onset of heart failure 40s.  Died in Apr 17, 2022.   Diabetes Mother    Hypertension Father    Diabetes Father     Social History   Socioeconomic History   Marital status: Single    Spouse name: Not on file   Number of children: 0   Years of education: Not on file   Highest education level: 12th grade  Occupational History   Occupation: Diaabled  Tobacco Use   Smoking status: Former    Current packs/day: 0.00    Average packs/day: 0.5 packs/day    Types: Cigarettes    Quit date: 2022    Years since quitting: 4.0   Smokeless tobacco: Never  Vaping Use   Vaping status: Never Used  Substance and Sexual Activity   Alcohol use: Not Currently   Drug use: Not Currently    Types: Cocaine    Comment: Admits to using cocaine up and will July 2024.   Sexual activity: Not Currently    Birth control/protection: None  Other Topics Concern   Not on file  Social History Narrative   Lives locally.  Does not routinely exercise.  Has been using cocaine.   Social Drivers of Health   Tobacco Use: Medium Risk (06/01/2024)   Patient History    Smoking Tobacco Use: Former    Smokeless Tobacco Use: Never    Passive Exposure: Not on file  Financial Resource Strain: High Risk (08/25/2023)   Overall Financial Resource Strain (CARDIA)    Difficulty of Paying Living Expenses: Very hard  Food Insecurity: No Food Insecurity (05/18/2024)   Epic    Worried About Programme Researcher, Broadcasting/film/video in the Last Year: Never true    Ran Out of Food in the Last Year: Never true  Transportation Needs: Unmet Transportation Needs (05/29/2024)   Epic    Lack of Transportation (Medical): Yes    Lack of Transportation (Non-Medical): Yes  Physical Activity: Not on file  Stress: Not on file  Social Connections: Unknown (08/28/2023)   Social Connection and Isolation Panel    Frequency of Communication with Friends and Family: Not on file    Frequency of Social  Gatherings with Friends and Family: Not on file    Attends Religious Services: Not on file    Active Member of Clubs or Organizations: Not on file    Attends Banker Meetings: Not on file    Marital Status: Never married  Intimate Partner Violence: Not At Risk (05/18/2024)   Epic    Fear of Current or Ex-Partner: No    Emotionally Abused: No    Physically Abused: No    Sexually Abused: No  Depression (PHQ2-9): Low Risk (06/01/2024)   Depression (PHQ2-9)    PHQ-2 Score: 0  Alcohol Screen: Low Risk (06/01/2024)   Alcohol Screen  Last Alcohol Screening Score (AUDIT): 0  Housing: High Risk (05/18/2024)   Epic    Unable to Pay for Housing in the Last Year: Yes    Number of Times Moved in the Last Year: 4    Homeless in the Last Year: Yes  Utilities: Not At Risk (05/18/2024)   Epic    Threatened with loss of utilities: No  Health Literacy: Not on file    Outpatient Medications Prior to Visit  Medication Sig Dispense Refill   aspirin  EC 81 MG tablet Take 1 tablet (81 mg total) by mouth daily. Swallow whole. 30 tablet 12   atorvastatin  (LIPITOR) 40 MG tablet Take 1 tablet (40 mg total) by mouth daily. 30 tablet 12   carvedilol  (COREG ) 12.5 MG tablet Take 1 tablet (12.5 mg total) by mouth 2 (two) times daily with a meal. 180 tablet 1   clopidogrel  (PLAVIX ) 75 MG tablet Take 1 tablet (75 mg total) by mouth daily. 90 tablet 1   dapagliflozin  propanediol (FARXIGA ) 10 MG TABS tablet Take 1 tablet (10 mg total) by mouth once daily. 30 tablet 11   digoxin  (LANOXIN ) 0.125 MG tablet Take 0.5 tablets (0.0625 mg total) by mouth daily. 45 tablet 1   ferrous sulfate  325 (65 FE) MG tablet Take 1 tablet (325 mg total) by mouth 2 (two) times daily. 60 tablet 11   linagliptin  (TRADJENTA ) 5 MG TABS tablet Take 1 tablet (5 mg total) by mouth daily. 30 tablet 3   sacubitril -valsartan  (ENTRESTO ) 97-103 MG Take 1 tablet by mouth 2 (two) times daily. 60 tablet 11   spironolactone  (ALDACTONE ) 25 MG  tablet Take 1 tablet (25 mg total) by mouth daily. 90 tablet 1   torsemide  (DEMADEX ) 20 MG tablet Take 3 tablets (60 mg total) by mouth daily. 270 tablet 3   Accu-Chek Softclix Lancets lancets Use as instructed 100 each 12   Continuous Glucose Sensor (FREESTYLE LIBRE 3 SENSOR) MISC Place 1 sensor on the skin every 14 days. Use to check glucose continuously 2 each 12   No facility-administered medications prior to visit.    Allergies  Allergen Reactions   Other Rash    Lemons     ROS Review of Systems  Musculoskeletal:  Positive for arthralgias.      Objective:    Physical Exam Constitutional:      Appearance: Normal appearance.  HENT:     Head: Normocephalic and atraumatic.  Eyes:     Conjunctiva/sclera: Conjunctivae normal.  Cardiovascular:     Rate and Rhythm: Normal rate and regular rhythm.     Pulses:          Dorsalis pedis pulses are 2+ on the right side and 2+ on the left side.  Pulmonary:     Effort: Pulmonary effort is normal.     Breath sounds: Normal breath sounds.  Musculoskeletal:     Right knee: Effusion and bony tenderness present. No swelling. Normal range of motion. Tenderness present over the lateral joint line.     Right lower leg: No edema.     Left lower leg: No edema.     Right foot: Normal range of motion. No deformity, bunion, Charcot foot, foot drop or prominent metatarsal heads.     Left foot: Normal range of motion. No deformity, bunion, Charcot foot, foot drop or prominent metatarsal heads.  Feet:     Right foot:     Protective Sensation: 6 sites tested.  6 sites sensed.  Skin integrity: Skin integrity normal.     Toenail Condition: Right toenails are normal.     Left foot:     Protective Sensation: 6 sites tested.  6 sites sensed.     Skin integrity: Skin integrity normal.     Toenail Condition: Left toenails are normal.  Skin:    General: Skin is warm and dry.  Neurological:     General: No focal deficit present.     Mental  Status: She is alert. Mental status is at baseline.  Psychiatric:        Mood and Affect: Mood normal.        Behavior: Behavior normal.     BP 120/80   Pulse 80   Temp 98.3 F (36.8 C) (Oral)   Resp (S) 16   Ht 5' 7 (1.702 m)   Wt 230 lb (104.3 kg)   SpO2 98%   BMI 36.02 kg/m  Wt Readings from Last 3 Encounters:  06/01/24 230 lb (104.3 kg)  05/30/24 228 lb 12.8 oz (103.8 kg)  05/23/24 231 lb 9.6 oz (105.1 kg)     Health Maintenance Due  Topic Date Due   COVID-19 Vaccine (1) Never done   FOOT EXAM  11/16/2023   OPHTHALMOLOGY EXAM  11/26/2023   Influenza Vaccine  12/02/2023   Mammogram  05/25/2024     There are no preventive care reminders to display for this patient.   Lab Results  Component Value Date   TSH 5.114 (H) 11/09/2021   Lab Results  Component Value Date   WBC 4.8 05/23/2024   HGB 12.5 05/23/2024   HCT 40.5 05/23/2024   MCV 92.9 05/23/2024   PLT 354 05/23/2024   Lab Results  Component Value Date   NA 137 05/30/2024   K 4.2 05/30/2024   CO2 24 05/30/2024   GLUCOSE 185 (H) 05/30/2024   BUN 37 (H) 05/30/2024   CREATININE 1.72 (H) 05/30/2024   BILITOT 0.7 05/16/2024   ALKPHOS 112 05/16/2024   AST 13 (L) 05/16/2024   ALT 6 05/16/2024   PROT 5.3 (L) 05/16/2024   ALBUMIN  2.2 (L) 05/16/2024   CALCIUM  8.7 (L) 05/30/2024   ANIONGAP 12 05/30/2024   EGFR 54 (L) 01/16/2024   Lab Results  Component Value Date   CHOL 210 (H) 02/21/2024   Lab Results  Component Value Date   HDL 59 02/21/2024   Lab Results  Component Value Date   LDLCALC 130 (H) 02/21/2024   Lab Results  Component Value Date   TRIG 107 02/21/2024   Lab Results  Component Value Date   CHOLHDL 3.6 02/21/2024   Lab Results  Component Value Date   HGBA1C 9.2 (H) 05/20/2024      Assessment & Plan:   Assessment & Plan  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 Tradjenta   contraindicated due to heart failure risk. Ozempic  previously well-tolerated. Continuous glucose monitoring essential. - Discontinued Tradjenta . - Continue Farxiga . - Restarted Ozempic , prescription sent to [facility] pharmacy. - Provided Freestyle Libre 3 Plus samples for continuous glucose monitoring. - Scheduled follow-up in 3 months to recheck A1c and Ozempic  efficacy.  Chronic systolic heart failure/Hypertension  Heart failure management ongoing with current medications. Tradjenta  discontinued due to exacerbation risk. - Continue current heart failure medications: carvedilol , digoxin , Entresto , spironolactone , and torsemide .  Chronic right knee pain with joint effusion Chronic knee pain with swelling and lateral pain. Previous x-ray normal. Possible  ligament or meniscus injury. MRI may be needed. - Referred to [facility] for further evaluation and management. - Consider MRI for ligament or meniscus injury. - Discuss potential for physical therapy or surgical intervention based on orthopedic evaluation.  Hyperlipidemia Management ongoing with current medication regimen. - Continue current lipid-lowering therapy.  General Health Maintenance - Scheduled mammogram. - Ensured flu shot administered.  - Semaglutide ,0.25 or 0.5MG /DOS, 2 MG/3ML SOPN; Inject 0.25 mg into the skin once a week.  Dispense: 3 mL; Refill: 2 - Continuous Glucose Sensor (FREESTYLE LIBRE 3 SENSOR) MISC; Place 1 sensor on the skin every 14 days. Use to check glucose continuously  Dispense: 2 each; Refill: 12 - HM Diabetes Foot Exam - AMB referral to orthopedics - MM 3D SCREENING MAMMOGRAM BILATERAL BREAST; Future - Flu vaccine trivalent PF, 6mos and older(Flulaval,Afluria,Fluarix,Fluzone)   Follow-up: Return in about 3 months (around 08/30/2024).    Sharyle Fischer, DO "

## 2024-06-02 ENCOUNTER — Other Ambulatory Visit: Payer: Self-pay

## 2024-06-05 ENCOUNTER — Other Ambulatory Visit: Payer: Self-pay

## 2024-06-05 ENCOUNTER — Telehealth: Payer: Self-pay

## 2024-06-05 NOTE — Telephone Encounter (Signed)
 The Freestyle Libre 14 day, 2 and 3 are being discontinued in September 2025. The new version is 2 plus and 3 plus and are available. We are highly encouraging providers to go ahead and make the switch now. That will prevent us  from doing the prior auth on the old version now and then having to do a prior auth on the new version again in a few months. Thanks!

## 2024-06-06 ENCOUNTER — Other Ambulatory Visit: Payer: Self-pay

## 2024-06-06 ENCOUNTER — Other Ambulatory Visit (HOSPITAL_COMMUNITY): Payer: Self-pay

## 2024-06-06 ENCOUNTER — Other Ambulatory Visit: Payer: Self-pay | Admitting: Internal Medicine

## 2024-06-06 ENCOUNTER — Telehealth: Payer: Self-pay

## 2024-06-06 DIAGNOSIS — E1122 Type 2 diabetes mellitus with diabetic chronic kidney disease: Secondary | ICD-10-CM

## 2024-06-06 MED ORDER — FREESTYLE LIBRE 3 PLUS SENSOR MISC
3 refills | Status: AC
  Filled 2024-06-06: qty 6, 90d supply, fill #0

## 2024-06-06 NOTE — Telephone Encounter (Signed)
 Pharmacy Patient Advocate Encounter   Received notification from Pt Calls Messages that prior authorization for FreeStyle Libre 3 Plus Sensor  is required/requested.   Insurance verification completed.   The patient is insured through [employer].   Per test claim: PA required; PA submitted to above mentioned insurance via Latent Key/confirmation #/EOC [id number] Status is pending

## 2024-06-07 ENCOUNTER — Ambulatory Visit: Payer: MEDICAID

## 2024-06-07 ENCOUNTER — Other Ambulatory Visit (HOSPITAL_COMMUNITY): Payer: Self-pay

## 2024-06-07 VITALS — BP 132/86 | HR 69 | Ht 67.0 in | Wt 233.4 lb

## 2024-06-07 DIAGNOSIS — M25561 Pain in right knee: Secondary | ICD-10-CM

## 2024-06-07 DIAGNOSIS — G8929 Other chronic pain: Secondary | ICD-10-CM

## 2024-06-07 DIAGNOSIS — M1711 Unilateral primary osteoarthritis, right knee: Secondary | ICD-10-CM

## 2024-06-07 NOTE — Progress Notes (Signed)
 "  Office Visit Note   Patient: Molly Howard           Date of Birth: 1986-03-05           MRN: [id number] Visit Date: 06/07/2024              Requested by: [name], DO [address] [place],  [place] [address] PCP: [name], DO   Assessment & Plan: Visit Diagnoses:  1. Arthritis of knee, right   2. Chronic pain of right knee     Plan: Natural history and expected course discussed. Questions answered. Recommend rest and ice. PT referral for knee strengthening and ROM. Recommend acetaminophen  as needed for pain. Follow up prn  Orders:  Orders Placed This Encounter  Procedures   DG Knee 3 Views Right   Ambulatory referral to Physical Therapy     Subjective: Chief Complaint: Right knee pain  HPI Patient is a 39 y.o. year old female who presents with knee pain involving the  right knee. Onset of the symptoms was about a month ago. Inciting event: Patient hit the knee her knee when getting out of a car. Hit the knee on the medial side but pain is now on lateral side. Current symptoms include pain.. Pain is aggravated by any weight bearing. Treatment to date: none.  Objective: Vital Signs: BP 132/86   Pulse 69   Ht 5' 7 (1.702 m)   Wt 105.9 kg   BMI 36.56 kg/m   Physical Exam Gen: Alert, No Acute Distress right knee: Skin intact, no erythema or induration noted. lateral joint line tenderness to palpation. Range of motion 0 to 130. No instability with varus or valgus stress.  Imaging: Radiographs personally reviewed by me; reveal subchondral sclerosis of the medial and lateral tibial plateau of the right knee   PMFS History: Patient Active Problem List   Diagnosis Date Noted   Acute on chronic systolic CHF (congestive heart failure) (HCC) 05/15/2024   Hypertensive urgency 05/15/2024   Coronary artery disease 05/15/2024   Acute CHF (congestive heart failure) (HCC) 05/15/2024   Acute CVA (cerebrovascular accident) (HCC)  02/20/2024   Facial droop 01/06/2024   Right sided weakness 01/06/2024   Expressive aphasia 01/04/2024   History of medication noncompliance 10/12/2023   CHF (congestive heart failure) (HCC) 10/11/2023   Class 1 obesity 08/31/2023   Hyponatremia 08/31/2023   Acute on chronic HFrEF (heart failure with reduced ejection fraction) (HCC) 08/28/2023   COPD exacerbation (HCC) 08/28/2023   Acute kidney injury superimposed on CKD 08/15/2023   Chest pain 08/07/2023   Type 2 diabetes mellitus without complications (HCC) 08/07/2023   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   Primary hypertension 04/16/2023   Controlled type 2 diabetes mellitus without complication, without long-term current use of insulin  (HCC) 04/15/2023   Cardiogenic shock (HCC) 11/14/2022   Bilateral leg edema 11/06/2022   NICM (nonischemic cardiomyopathy) (HCC) 07/31/2022   Hypocalcemia 07/25/2022   Leg edema 07/25/2022   Acute on chronic combined systolic and diastolic CHF (congestive heart failure) (HCC) 07/25/2022   GERD without esophagitis 07/24/2022   Cocaine abuse (HCC) 02/12/2022   Acute pulmonary edema (HCC)    Acute systolic heart failure (HCC) 02/09/2022   Syncope 02/09/2022   Menorrhagia with regular cycle 06/22/2021   Dyslipidemia 11/27/2020   Type II diabetes mellitus with renal manifestations (HCC) 11/27/2020   Chronic kidney disease (CKD), stage II (mild) 11/27/2020   Hypomagnesemia 10/17/2020   Uncontrolled type 2 diabetes  mellitus with hyperglycemia, without long-term current use of insulin  (HCC) 10/11/2020   Dyspnea 05/21/2020   Essential hypertension 05/20/2020   Diabetes mellitus, type II (HCC) 05/20/2020   Chronic anemia 05/20/2020   Reactive thrombocytosis 05/20/2020   Obesity (BMI 30-39.9) 05/20/2020   Chronic combined systolic and diastolic heart failure (HCC) 11/01/2019   Financial difficulties 11/01/2019   Medication management 11/01/2019   Lung nodule 11/01/2019    Nonischemic cardiomyopathy (HCC) 11/01/2019   Polysubstance abuse (HCC) 11/01/2019   Tobacco use 11/01/2019   HFrEF (heart failure with reduced ejection fraction) (HCC)    Swelling    Anemia 10/02/2019   Past Medical History:  Diagnosis Date   Acid reflux    Chronic HFrEF (heart failure with reduced ejection fraction) (HCC)    a. 10/2019 Echo: EF 35-40%, GrII DD; b. 05/2020 Echo: EF 20-25%, glob HK; c. 10/2020 Echo: EF 30-35%, glob HK. GrII DD, Mildly red RV fxn. Mod TR; d.  05/2021 cMRI: EF 42%, no LGE. Nl RV size/fxn.   CKD (chronic kidney disease), stage II    Diabetes mellitus (HCC)    H/O medication noncompliance    Hypertension    Microcytic anemia    NICM (nonischemic cardiomyopathy) (HCC)    a. 10/2019 Echo: EF 35-40%; b. 10/2019 MV: No ischemia. Small apical defect-->breast attenuation; c. 05/2020 Echo: EF 20-25%; d. 10/2020 Echo: EF 30-35%, glob HK. GrII DD, Mildly red RV fxn. Mod TR; e. 05/2021 cMRI: EF 42%, no LGE. Nl RV size/fxn.   Obesity    Polysubstance abuse (HCC)    Stroke [facility])     Family History  Problem Relation Age of Onset   Heart failure Mother        Onset of heart failure 23s.  Died in 04/19/2022.   Diabetes Mother    Hypertension Father    Diabetes Father     Past Surgical History:  Procedure Laterality Date   CHOLECYSTECTOMY     HERNIA REPAIR     ICD IMPLANT     RIGHT HEART CATH N/A 07/28/2022   Procedure: RIGHT HEART CATH;  Surgeon: Darron Deatrice LABOR, MD;  Location: [facility] INVASIVE CV LAB;  Service: Cardiovascular;  Laterality: N/A;   SUBQ ICD IMPLANT N/A 06/27/2023   Procedure: SUBQ ICD IMPLANT;  Surgeon: Cindie Ole DASEN, MD;  Location: [facility] INVASIVE CV LAB;  Service: Cardiovascular;  Laterality: N/A;   Social History   Occupational History   Occupation: Diaabled  Tobacco Use   Smoking status: Former    Current packs/day: 0.00    Average packs/day: 0.5 packs/day    Types: Cigarettes    Quit date: 2022    Years since quitting: 4.0   Smokeless  tobacco: Never  Vaping Use   Vaping status: Never Used  Substance and Sexual Activity   Alcohol use: Not Currently   Drug use: Not Currently    Types: Cocaine    Comment: Admits to using cocaine up and will July 2024.   Sexual activity: Not Currently    Birth control/protection: None   Current Outpatient Medications  Medication Instructions   Accu-Chek Softclix Lancets lancets Use as instructed   aspirin  EC 81 mg, Oral, Daily, Swallow whole.   atorvastatin  (LIPITOR) 40 mg, Oral, Daily   carvedilol  (COREG ) 12.5 mg, Oral, 2 times daily with meals   clopidogrel  (PLAVIX ) 75 mg, Oral, Daily   Continuous Glucose Sensor (FREESTYLE LIBRE 3 PLUS SENSOR) MISC Change sensor every 15 days.   dapagliflozin  propanediol (FARXIGA ) 10 MG  TABS tablet Take 1 tablet (10 mg total) by mouth once daily.   digoxin  (LANOXIN ) 0.0625 mg, Oral, Daily   FeroSul 325 mg, Oral, 2 times daily   sacubitril -valsartan  (ENTRESTO ) 97-103 MG 1 tablet, Oral, 2 times daily   Semaglutide (0.25 or 0.5MG /DOS) 0.25 mg, Subcutaneous, Weekly   spironolactone  (ALDACTONE ) 25 mg, Oral, Daily   torsemide  (DEMADEX ) 60 mg, Oral, Daily   Allergies as of 06/07/2024 - Review Complete 06/01/2024  Allergen Reaction Noted   Other Rash 06/23/2023   "

## 2024-06-07 NOTE — Patient Instructions (Signed)
 Referral placed to [facility] Address: [address] [place], [place] [address] Phone: [phone]  Apply ice, 20 minutes at a time, several times a day May use knee brace for additional support/comfort Take over-the-counter Tylenol  (no more than 3G per 24 hours) for pain relief  Follow-up at [facility] if no symptom improvement over the next 2-3 months

## 2024-06-07 NOTE — Telephone Encounter (Signed)
 Pharmacy Patient Advocate Encounter  Received notification from [employer] Prior Authorization for  FreeStyle Libre 3 Plus Sensor  has been DENIED.  See denial reason below. No denial letter attached in CMM. Will attach denial letter to Media tab once received.   PA #/Case ID/Reference #: [id number]

## 2024-06-08 ENCOUNTER — Other Ambulatory Visit (HOSPITAL_COMMUNITY): Payer: Self-pay

## 2024-06-26 ENCOUNTER — Ambulatory Visit: Payer: MEDICAID | Admitting: Family

## 2024-07-03 ENCOUNTER — Ambulatory Visit: Payer: MEDICAID

## 2024-08-30 ENCOUNTER — Ambulatory Visit: Payer: MEDICAID | Admitting: Internal Medicine
